# Patient Record
Sex: Male | Born: 1955
Health system: Southern US, Community
[De-identification: ages and names within clinical notes are randomized; demographics above are authoritative.]

## PROBLEM LIST (undated history)

## (undated) DIAGNOSIS — R339 Retention of urine, unspecified: Secondary | ICD-10-CM

## (undated) DIAGNOSIS — N184 Chronic kidney disease, stage 4 (severe): Secondary | ICD-10-CM

## (undated) DIAGNOSIS — M1990 Unspecified osteoarthritis, unspecified site: Secondary | ICD-10-CM

## (undated) DIAGNOSIS — D649 Anemia, unspecified: Secondary | ICD-10-CM

## (undated) DIAGNOSIS — I5030 Unspecified diastolic (congestive) heart failure: Secondary | ICD-10-CM

## (undated) DIAGNOSIS — E119 Type 2 diabetes mellitus without complications: Secondary | ICD-10-CM

## (undated) DIAGNOSIS — N185 Chronic kidney disease, stage 5: Secondary | ICD-10-CM

## (undated) DIAGNOSIS — L97309 Non-pressure chronic ulcer of unspecified ankle with unspecified severity: Secondary | ICD-10-CM

## (undated) DIAGNOSIS — L97509 Non-pressure chronic ulcer of other part of unspecified foot with unspecified severity: Secondary | ICD-10-CM

## (undated) DIAGNOSIS — M109 Gout, unspecified: Secondary | ICD-10-CM

## (undated) DIAGNOSIS — I10 Essential (primary) hypertension: Secondary | ICD-10-CM

## (undated) HISTORY — PX: ANKLE SURGERY: SHX546

## (undated) HISTORY — PX: FOOT SURGERY: SHX648

## (undated) HISTORY — PX: CHOLECYSTECTOMY: SHX55

---

## 2015-05-17 ENCOUNTER — Ambulatory Visit: Payer: Medicare HMO | Admitting: "Endocrinology

## 2016-02-14 ENCOUNTER — Encounter (HOSPITAL_COMMUNITY): Payer: Self-pay

## 2016-02-14 ENCOUNTER — Emergency Department (HOSPITAL_COMMUNITY)
Admission: EM | Admit: 2016-02-14 | Discharge: 2016-02-14 | Disposition: A | Discharge status: Home / Self Care | Payer: Medicare HMO | Attending: Emergency Medicine | Admitting: Emergency Medicine

## 2016-02-14 DIAGNOSIS — L97509 Non-pressure chronic ulcer of other part of unspecified foot with unspecified severity: Secondary | ICD-10-CM

## 2016-02-14 DIAGNOSIS — E08621 Diabetes mellitus due to underlying condition with foot ulcer: Secondary | ICD-10-CM | POA: Insufficient documentation

## 2016-02-14 DIAGNOSIS — Z4800 Encounter for change or removal of nonsurgical wound dressing: Secondary | ICD-10-CM | POA: Diagnosis present

## 2016-02-14 DIAGNOSIS — L02215 Cutaneous abscess of perineum: Secondary | ICD-10-CM | POA: Diagnosis not present

## 2016-02-14 DIAGNOSIS — I10 Essential (primary) hypertension: Secondary | ICD-10-CM | POA: Diagnosis not present

## 2016-02-14 DIAGNOSIS — L97529 Non-pressure chronic ulcer of other part of left foot with unspecified severity: Secondary | ICD-10-CM | POA: Insufficient documentation

## 2016-02-14 HISTORY — DX: Type 2 diabetes mellitus without complications: E11.9

## 2016-02-14 HISTORY — DX: Unspecified osteoarthritis, unspecified site: M19.90

## 2016-02-14 HISTORY — DX: Essential (primary) hypertension: I10

## 2016-02-14 MED ORDER — DOXYCYCLINE HYCLATE 100 MG PO CAPS: 100.0000 mg | ORAL_CAPSULE | Freq: Two times a day (BID) | ORAL | 0 refills | Status: DC

## 2016-02-14 NOTE — Discharge Instructions (Signed)
Please cleanse the wound areas with soap and water daily. Apply dressing daily. Please use doxycycline 2 times daily with food until all taken. You have an appointment with the [facility] wound center at 945 on 03/06/2016. You are also being given information concerning the wound care center in [facility] in the event that you need to be seen sooner.

## 2016-02-14 NOTE — ED Provider Notes (Signed)
[facility] DEPT Provider Note   CSN: [id number] Arrival date & time: 02/14/16  1347  By signing my name below, I, Danny [name], attest that this documentation has been prepared under the direction and in the presence of Lily Kocher, PA-C. Electronically Signed: Rayna [name], ED Scribe. 02/14/16. 3:28 PM.   History   Chief Complaint Chief Complaint  Patient presents with  . Wound Check    HPI HPI Comments: Danny [name] is a 60 y.o. male with a PMHx of DM and morbid obesity who presents to the Emergency Department for a wound check. Pt was last evaluated on 02/03/2016 who he states was supposed to refer him to a new wound care center which he denies has been done as of yet so he came to the ED today for evaluation and a referral. Pt states that he has a wound on his right perineum, right lateral ankle and left plantar foot, which have been evaluated at a wound center as well as by a podiatrist and have been present for the past four months. He is not currently taking abx. Pt has an allergy to rocephin and prednisone stating rocephin causes n/v. He denies other associated symptoms at this time.   The history is provided by the patient. No language interpreter was used.    Past Medical History:  Diagnosis Date  . Arthritis   . Diabetes mellitus without complication ([place])   . Hypertension     There are no active problems to display for this patient.   Past Surgical History:  Procedure Laterality Date  . CHOLECYSTECTOMY       Home Medications    Prior to Admission medications   Not on File    Family History No family history on file.  Social History Social History  Substance Use Topics  . Smoking status: Never Smoker  . Smokeless tobacco: Never Used  . Alcohol use No     Allergies   Review of patient's allergies indicates not on file.   Review of Systems Review of Systems  Constitutional: Negative for chills and fever.  Musculoskeletal: Positive for  myalgias.  Skin: Positive for color change and wound.  Allergic/Immunologic: Positive for immunocompromised state.  All other systems reviewed and are negative.    Physical Exam Updated Vital Signs BP 161/74 (BP Location: Right Arm)   Pulse 81   Temp 98.3 F (36.8 C) (Oral)   Resp 20   Ht 6\' 1"  (1.854 m)   Wt 295 lb (133.8 kg)   SpO2 98%   BMI 38.92 kg/m   Physical Exam  Constitutional: He is oriented to person, place, and time. He appears well-developed.  HENT:  Head: Normocephalic and atraumatic.  Eyes: EOM are normal.  Neck: Normal range of motion.  Cardiovascular: Normal rate, regular rhythm, normal heart sounds and intact distal pulses.  Exam reveals no gallop and no friction rub.   No murmur heard. DP pulse 2+ bilaterally.   Pulmonary/Chest: Effort normal and breath sounds normal. No respiratory distress. He has no wheezes. He has no rales.  Abdominal: Soft.  Genitourinary:  Genitourinary Comments: Chaperone present Ulcer with some drainage to the perineum. No red streaks noted. No palpable abscess at this time. No increased redness of the scrotum. No palpable nodes of the inguinal region.   Musculoskeletal: Normal range of motion.  Neurological: He is alert and oriented to person, place, and time.  Skin: Skin is warm and dry. Capillary refill takes less than 2 seconds.  Right ankle  overlying the lateral malleolus has an ulcer with minimal drainage. Area of irritation just above the ulcer with mild to moderate redness. No red streaks appreciated. No lesions noted between the toes of the right foot. No puncture wounds to the plantar surface of the right foot. Capillary refill less than 2 seconds.  Left plantar surface between the 2nd and 3rd metatarsal heads contains a 3.2 cm ulcer with approximately 1 cm of depth. Capillary refill less than 2 seconds. No red streaks of the LLE.   Psychiatric: He has a normal mood and affect.  Nursing note and vitals reviewed.  ED  Treatments / Results  Labs (all labs ordered are listed, but only abnormal results are displayed) Labs Reviewed - No data to display  EKG  EKG Interpretation None       Radiology No results found.  Procedures Procedures  DIAGNOSTIC STUDIES: Oxygen Saturation is 98% on RA, normal by my interpretation.    COORDINATION OF CARE: 3:26 PM Discussed next steps with pt. Pt verbalized understanding and is agreeable with the plan.    Medications Ordered in ED Medications - No data to display   Initial Impression / Assessment and Plan / ED Course  I have reviewed the triage vital signs and the nursing notes.  Pertinent labs & imaging results that were available during my care of the patient were reviewed by me and considered in my medical decision making (see chart for details).  Clinical Course    **I personally performed the services described in this documentation, which was scribed in my presence. The recorded information has been reviewed and is accurate.*  **I have reviewed nursing notes, vital signs, and all appropriate lab and imaging results for this patient.*  Final Clinical Impressions(s) / ED Diagnoses  Pt placed on antibiotics for the infected wound of the groin. Ulcer of the right ankle and the left foot wrapped with dressing. Pt referred to wound care. Pt to return if any changes or problem.   Final diagnoses:  None    New Prescriptions New Prescriptions   No medications on file     Lily Kocher, PA-C 02/15/16 [place], DO 02/18/16 1459

## 2016-02-14 NOTE — ED Triage Notes (Signed)
Pt here for wound recheck . Pt states he has three wounds that need to be checked. States the one to the groin area has gotten worse.

## 2016-02-25 ENCOUNTER — Encounter (HOSPITAL_BASED_OUTPATIENT_CLINIC_OR_DEPARTMENT_OTHER): Payer: Medicare HMO | Attending: Internal Medicine

## 2016-02-25 DIAGNOSIS — L97311 Non-pressure chronic ulcer of right ankle limited to breakdown of skin: Secondary | ICD-10-CM | POA: Diagnosis not present

## 2016-02-25 DIAGNOSIS — Z87891 Personal history of nicotine dependence: Secondary | ICD-10-CM | POA: Insufficient documentation

## 2016-02-25 DIAGNOSIS — L98491 Non-pressure chronic ulcer of skin of other sites limited to breakdown of skin: Secondary | ICD-10-CM | POA: Insufficient documentation

## 2016-02-25 DIAGNOSIS — E11621 Type 2 diabetes mellitus with foot ulcer: Secondary | ICD-10-CM | POA: Diagnosis present

## 2016-02-25 DIAGNOSIS — E11622 Type 2 diabetes mellitus with other skin ulcer: Secondary | ICD-10-CM | POA: Diagnosis not present

## 2016-02-25 DIAGNOSIS — E1140 Type 2 diabetes mellitus with diabetic neuropathy, unspecified: Secondary | ICD-10-CM | POA: Diagnosis not present

## 2016-02-25 DIAGNOSIS — L97421 Non-pressure chronic ulcer of left heel and midfoot limited to breakdown of skin: Secondary | ICD-10-CM | POA: Insufficient documentation

## 2016-02-25 DIAGNOSIS — I10 Essential (primary) hypertension: Secondary | ICD-10-CM | POA: Insufficient documentation

## 2016-02-25 DIAGNOSIS — Z96659 Presence of unspecified artificial knee joint: Secondary | ICD-10-CM | POA: Diagnosis not present

## 2016-02-25 DIAGNOSIS — Z794 Long term (current) use of insulin: Secondary | ICD-10-CM | POA: Diagnosis not present

## 2016-02-25 LAB — GLUCOSE, CAPILLARY
Glucose-Capillary: 62 mg/dL — ABNORMAL LOW (ref 65–99)
Glucose-Capillary: 87 mg/dL (ref 65–99)

## 2016-03-03 DIAGNOSIS — E11621 Type 2 diabetes mellitus with foot ulcer: Secondary | ICD-10-CM | POA: Diagnosis not present

## 2016-03-06 ENCOUNTER — Encounter (HOSPITAL_BASED_OUTPATIENT_CLINIC_OR_DEPARTMENT_OTHER): Payer: Medicare HMO

## 2016-03-06 DIAGNOSIS — E11621 Type 2 diabetes mellitus with foot ulcer: Secondary | ICD-10-CM | POA: Diagnosis not present

## 2016-03-10 DIAGNOSIS — E11621 Type 2 diabetes mellitus with foot ulcer: Secondary | ICD-10-CM | POA: Diagnosis not present

## 2016-03-17 DIAGNOSIS — E11621 Type 2 diabetes mellitus with foot ulcer: Secondary | ICD-10-CM | POA: Diagnosis not present

## 2016-03-24 DIAGNOSIS — E11621 Type 2 diabetes mellitus with foot ulcer: Secondary | ICD-10-CM | POA: Diagnosis not present

## 2016-03-31 ENCOUNTER — Encounter (HOSPITAL_BASED_OUTPATIENT_CLINIC_OR_DEPARTMENT_OTHER): Payer: Medicare HMO | Attending: Internal Medicine

## 2016-03-31 DIAGNOSIS — I10 Essential (primary) hypertension: Secondary | ICD-10-CM | POA: Insufficient documentation

## 2016-03-31 DIAGNOSIS — L97311 Non-pressure chronic ulcer of right ankle limited to breakdown of skin: Secondary | ICD-10-CM | POA: Insufficient documentation

## 2016-03-31 DIAGNOSIS — E11621 Type 2 diabetes mellitus with foot ulcer: Secondary | ICD-10-CM | POA: Insufficient documentation

## 2016-03-31 DIAGNOSIS — E11622 Type 2 diabetes mellitus with other skin ulcer: Secondary | ICD-10-CM | POA: Insufficient documentation

## 2016-03-31 DIAGNOSIS — L400 Psoriasis vulgaris: Secondary | ICD-10-CM | POA: Diagnosis not present

## 2016-03-31 DIAGNOSIS — L97421 Non-pressure chronic ulcer of left heel and midfoot limited to breakdown of skin: Secondary | ICD-10-CM | POA: Diagnosis not present

## 2016-03-31 DIAGNOSIS — L84 Corns and callosities: Secondary | ICD-10-CM | POA: Insufficient documentation

## 2016-03-31 DIAGNOSIS — L98491 Non-pressure chronic ulcer of skin of other sites limited to breakdown of skin: Secondary | ICD-10-CM | POA: Diagnosis not present

## 2016-04-07 DIAGNOSIS — E11621 Type 2 diabetes mellitus with foot ulcer: Secondary | ICD-10-CM | POA: Diagnosis not present

## 2016-04-14 DIAGNOSIS — E11621 Type 2 diabetes mellitus with foot ulcer: Secondary | ICD-10-CM | POA: Diagnosis not present

## 2016-04-21 DIAGNOSIS — E11621 Type 2 diabetes mellitus with foot ulcer: Secondary | ICD-10-CM | POA: Diagnosis not present

## 2016-04-28 ENCOUNTER — Encounter (HOSPITAL_BASED_OUTPATIENT_CLINIC_OR_DEPARTMENT_OTHER): Payer: Medicare HMO | Attending: Internal Medicine

## 2016-04-28 DIAGNOSIS — I10 Essential (primary) hypertension: Secondary | ICD-10-CM | POA: Insufficient documentation

## 2016-04-28 DIAGNOSIS — E11622 Type 2 diabetes mellitus with other skin ulcer: Secondary | ICD-10-CM | POA: Insufficient documentation

## 2016-04-28 DIAGNOSIS — L97311 Non-pressure chronic ulcer of right ankle limited to breakdown of skin: Secondary | ICD-10-CM | POA: Insufficient documentation

## 2016-04-28 DIAGNOSIS — L97811 Non-pressure chronic ulcer of other part of right lower leg limited to breakdown of skin: Secondary | ICD-10-CM | POA: Insufficient documentation

## 2016-04-28 DIAGNOSIS — L400 Psoriasis vulgaris: Secondary | ICD-10-CM | POA: Diagnosis not present

## 2016-04-28 DIAGNOSIS — L97421 Non-pressure chronic ulcer of left heel and midfoot limited to breakdown of skin: Secondary | ICD-10-CM | POA: Insufficient documentation

## 2016-04-28 DIAGNOSIS — E11621 Type 2 diabetes mellitus with foot ulcer: Secondary | ICD-10-CM | POA: Insufficient documentation

## 2016-05-05 DIAGNOSIS — E11621 Type 2 diabetes mellitus with foot ulcer: Secondary | ICD-10-CM | POA: Diagnosis not present

## 2016-05-12 DIAGNOSIS — E11621 Type 2 diabetes mellitus with foot ulcer: Secondary | ICD-10-CM | POA: Diagnosis not present

## 2016-05-26 ENCOUNTER — Encounter (HOSPITAL_BASED_OUTPATIENT_CLINIC_OR_DEPARTMENT_OTHER): Payer: Medicare HMO | Attending: Internal Medicine

## 2016-05-26 DIAGNOSIS — L97422 Non-pressure chronic ulcer of left heel and midfoot with fat layer exposed: Secondary | ICD-10-CM | POA: Insufficient documentation

## 2016-05-26 DIAGNOSIS — E11621 Type 2 diabetes mellitus with foot ulcer: Secondary | ICD-10-CM | POA: Insufficient documentation

## 2016-05-26 DIAGNOSIS — L97312 Non-pressure chronic ulcer of right ankle with fat layer exposed: Secondary | ICD-10-CM | POA: Insufficient documentation

## 2016-05-26 DIAGNOSIS — I10 Essential (primary) hypertension: Secondary | ICD-10-CM | POA: Diagnosis not present

## 2016-05-26 DIAGNOSIS — L97812 Non-pressure chronic ulcer of other part of right lower leg with fat layer exposed: Secondary | ICD-10-CM | POA: Insufficient documentation

## 2016-06-02 DIAGNOSIS — E11621 Type 2 diabetes mellitus with foot ulcer: Secondary | ICD-10-CM | POA: Diagnosis not present

## 2016-06-09 DIAGNOSIS — E11621 Type 2 diabetes mellitus with foot ulcer: Secondary | ICD-10-CM | POA: Diagnosis not present

## 2016-06-16 DIAGNOSIS — E11621 Type 2 diabetes mellitus with foot ulcer: Secondary | ICD-10-CM | POA: Diagnosis not present

## 2016-06-30 ENCOUNTER — Encounter (HOSPITAL_BASED_OUTPATIENT_CLINIC_OR_DEPARTMENT_OTHER): Payer: Medicare HMO | Attending: Internal Medicine

## 2016-06-30 DIAGNOSIS — I10 Essential (primary) hypertension: Secondary | ICD-10-CM | POA: Insufficient documentation

## 2016-06-30 DIAGNOSIS — L97522 Non-pressure chronic ulcer of other part of left foot with fat layer exposed: Secondary | ICD-10-CM | POA: Diagnosis not present

## 2016-06-30 DIAGNOSIS — E11621 Type 2 diabetes mellitus with foot ulcer: Secondary | ICD-10-CM | POA: Insufficient documentation

## 2016-06-30 DIAGNOSIS — L97312 Non-pressure chronic ulcer of right ankle with fat layer exposed: Secondary | ICD-10-CM | POA: Diagnosis not present

## 2016-07-07 DIAGNOSIS — E11621 Type 2 diabetes mellitus with foot ulcer: Secondary | ICD-10-CM | POA: Diagnosis not present

## 2016-07-21 DIAGNOSIS — E11621 Type 2 diabetes mellitus with foot ulcer: Secondary | ICD-10-CM | POA: Diagnosis not present

## 2016-07-28 ENCOUNTER — Encounter (HOSPITAL_BASED_OUTPATIENT_CLINIC_OR_DEPARTMENT_OTHER): Payer: Medicare HMO | Attending: Internal Medicine

## 2016-07-28 DIAGNOSIS — L97522 Non-pressure chronic ulcer of other part of left foot with fat layer exposed: Secondary | ICD-10-CM | POA: Insufficient documentation

## 2016-07-28 DIAGNOSIS — E11622 Type 2 diabetes mellitus with other skin ulcer: Secondary | ICD-10-CM | POA: Insufficient documentation

## 2016-07-28 DIAGNOSIS — L97312 Non-pressure chronic ulcer of right ankle with fat layer exposed: Secondary | ICD-10-CM | POA: Diagnosis not present

## 2016-07-28 DIAGNOSIS — L98491 Non-pressure chronic ulcer of skin of other sites limited to breakdown of skin: Secondary | ICD-10-CM | POA: Insufficient documentation

## 2016-07-28 DIAGNOSIS — I10 Essential (primary) hypertension: Secondary | ICD-10-CM | POA: Diagnosis not present

## 2016-07-28 DIAGNOSIS — E11621 Type 2 diabetes mellitus with foot ulcer: Secondary | ICD-10-CM | POA: Diagnosis present

## 2016-08-04 DIAGNOSIS — E11621 Type 2 diabetes mellitus with foot ulcer: Secondary | ICD-10-CM | POA: Diagnosis not present

## 2016-08-11 DIAGNOSIS — E11621 Type 2 diabetes mellitus with foot ulcer: Secondary | ICD-10-CM | POA: Diagnosis not present

## 2016-08-18 DIAGNOSIS — E11621 Type 2 diabetes mellitus with foot ulcer: Secondary | ICD-10-CM | POA: Diagnosis not present

## 2016-08-25 ENCOUNTER — Encounter (HOSPITAL_BASED_OUTPATIENT_CLINIC_OR_DEPARTMENT_OTHER): Payer: Medicare HMO | Attending: Internal Medicine

## 2016-08-25 DIAGNOSIS — E11621 Type 2 diabetes mellitus with foot ulcer: Secondary | ICD-10-CM | POA: Insufficient documentation

## 2016-08-25 DIAGNOSIS — I10 Essential (primary) hypertension: Secondary | ICD-10-CM | POA: Diagnosis not present

## 2016-08-25 DIAGNOSIS — L97412 Non-pressure chronic ulcer of right heel and midfoot with fat layer exposed: Secondary | ICD-10-CM | POA: Diagnosis not present

## 2016-08-25 DIAGNOSIS — E11622 Type 2 diabetes mellitus with other skin ulcer: Secondary | ICD-10-CM | POA: Diagnosis not present

## 2016-08-25 DIAGNOSIS — L97312 Non-pressure chronic ulcer of right ankle with fat layer exposed: Secondary | ICD-10-CM | POA: Diagnosis not present

## 2016-09-01 DIAGNOSIS — E11621 Type 2 diabetes mellitus with foot ulcer: Secondary | ICD-10-CM | POA: Diagnosis not present

## 2016-09-08 DIAGNOSIS — E11621 Type 2 diabetes mellitus with foot ulcer: Secondary | ICD-10-CM | POA: Diagnosis not present

## 2016-09-15 DIAGNOSIS — E11621 Type 2 diabetes mellitus with foot ulcer: Secondary | ICD-10-CM | POA: Diagnosis not present

## 2016-09-22 ENCOUNTER — Ambulatory Visit (HOSPITAL_COMMUNITY)
Admission: RE | Admit: 2016-09-22 | Discharge: 2016-09-22 | Disposition: A | Discharge status: Home / Self Care | Payer: Medicare HMO | Source: Ambulatory Visit | Attending: Nurse Practitioner | Admitting: Nurse Practitioner

## 2016-09-22 ENCOUNTER — Other Ambulatory Visit: Payer: Self-pay | Admitting: Nurse Practitioner

## 2016-09-22 DIAGNOSIS — L97512 Non-pressure chronic ulcer of other part of right foot with fat layer exposed: Secondary | ICD-10-CM | POA: Insufficient documentation

## 2016-09-22 DIAGNOSIS — L97819 Non-pressure chronic ulcer of other part of right lower leg with unspecified severity: Secondary | ICD-10-CM | POA: Insufficient documentation

## 2016-09-22 DIAGNOSIS — M19072 Primary osteoarthritis, left ankle and foot: Secondary | ICD-10-CM | POA: Diagnosis not present

## 2016-09-22 DIAGNOSIS — L97522 Non-pressure chronic ulcer of other part of left foot with fat layer exposed: Secondary | ICD-10-CM | POA: Insufficient documentation

## 2016-09-22 DIAGNOSIS — E13621 Other specified diabetes mellitus with foot ulcer: Secondary | ICD-10-CM

## 2016-09-22 DIAGNOSIS — M7989 Other specified soft tissue disorders: Secondary | ICD-10-CM | POA: Diagnosis not present

## 2016-09-22 DIAGNOSIS — M19071 Primary osteoarthritis, right ankle and foot: Secondary | ICD-10-CM | POA: Diagnosis not present

## 2016-09-22 DIAGNOSIS — L97529 Non-pressure chronic ulcer of other part of left foot with unspecified severity: Secondary | ICD-10-CM

## 2016-09-22 DIAGNOSIS — M7732 Calcaneal spur, left foot: Secondary | ICD-10-CM | POA: Insufficient documentation

## 2016-09-22 DIAGNOSIS — E11621 Type 2 diabetes mellitus with foot ulcer: Secondary | ICD-10-CM | POA: Diagnosis not present

## 2016-09-22 DIAGNOSIS — L97519 Non-pressure chronic ulcer of other part of right foot with unspecified severity: Secondary | ICD-10-CM

## 2016-09-29 ENCOUNTER — Encounter (HOSPITAL_BASED_OUTPATIENT_CLINIC_OR_DEPARTMENT_OTHER): Payer: Medicare HMO | Attending: Internal Medicine

## 2016-09-29 DIAGNOSIS — I10 Essential (primary) hypertension: Secondary | ICD-10-CM | POA: Diagnosis not present

## 2016-09-29 DIAGNOSIS — L97312 Non-pressure chronic ulcer of right ankle with fat layer exposed: Secondary | ICD-10-CM | POA: Insufficient documentation

## 2016-09-29 DIAGNOSIS — L97522 Non-pressure chronic ulcer of other part of left foot with fat layer exposed: Secondary | ICD-10-CM | POA: Diagnosis not present

## 2016-09-29 DIAGNOSIS — E11621 Type 2 diabetes mellitus with foot ulcer: Secondary | ICD-10-CM | POA: Diagnosis present

## 2016-10-06 ENCOUNTER — Other Ambulatory Visit (HOSPITAL_COMMUNITY)
Admission: RE | Admit: 2016-10-06 | Discharge: 2016-10-06 | Disposition: A | Discharge status: Home / Self Care | Payer: Medicare HMO | Source: Other Acute Inpatient Hospital | Attending: Internal Medicine | Admitting: Internal Medicine

## 2016-10-06 DIAGNOSIS — E11621 Type 2 diabetes mellitus with foot ulcer: Secondary | ICD-10-CM | POA: Diagnosis present

## 2016-10-09 LAB — AEROBIC CULTURE W GRAM STAIN (SUPERFICIAL SPECIMEN)

## 2016-10-09 LAB — AEROBIC CULTURE  (SUPERFICIAL SPECIMEN)

## 2016-10-13 DIAGNOSIS — E11621 Type 2 diabetes mellitus with foot ulcer: Secondary | ICD-10-CM | POA: Diagnosis not present

## 2016-10-20 DIAGNOSIS — E11621 Type 2 diabetes mellitus with foot ulcer: Secondary | ICD-10-CM | POA: Diagnosis not present

## 2016-10-27 ENCOUNTER — Encounter (HOSPITAL_BASED_OUTPATIENT_CLINIC_OR_DEPARTMENT_OTHER): Payer: Medicare HMO | Attending: Internal Medicine

## 2016-10-27 DIAGNOSIS — L97312 Non-pressure chronic ulcer of right ankle with fat layer exposed: Secondary | ICD-10-CM | POA: Insufficient documentation

## 2016-10-27 DIAGNOSIS — L97522 Non-pressure chronic ulcer of other part of left foot with fat layer exposed: Secondary | ICD-10-CM | POA: Insufficient documentation

## 2016-10-27 DIAGNOSIS — E11621 Type 2 diabetes mellitus with foot ulcer: Secondary | ICD-10-CM | POA: Insufficient documentation

## 2016-10-27 DIAGNOSIS — I10 Essential (primary) hypertension: Secondary | ICD-10-CM | POA: Diagnosis not present

## 2016-11-03 DIAGNOSIS — E11621 Type 2 diabetes mellitus with foot ulcer: Secondary | ICD-10-CM | POA: Diagnosis not present

## 2016-11-10 DIAGNOSIS — E11621 Type 2 diabetes mellitus with foot ulcer: Secondary | ICD-10-CM | POA: Diagnosis not present

## 2016-11-17 DIAGNOSIS — E11621 Type 2 diabetes mellitus with foot ulcer: Secondary | ICD-10-CM | POA: Diagnosis not present

## 2016-11-24 ENCOUNTER — Encounter (HOSPITAL_BASED_OUTPATIENT_CLINIC_OR_DEPARTMENT_OTHER): Payer: Medicare HMO | Attending: Internal Medicine

## 2016-11-24 DIAGNOSIS — L97522 Non-pressure chronic ulcer of other part of left foot with fat layer exposed: Secondary | ICD-10-CM | POA: Diagnosis not present

## 2016-11-24 DIAGNOSIS — E11622 Type 2 diabetes mellitus with other skin ulcer: Secondary | ICD-10-CM | POA: Diagnosis present

## 2016-11-24 DIAGNOSIS — I87333 Chronic venous hypertension (idiopathic) with ulcer and inflammation of bilateral lower extremity: Secondary | ICD-10-CM | POA: Insufficient documentation

## 2016-11-24 DIAGNOSIS — E11621 Type 2 diabetes mellitus with foot ulcer: Secondary | ICD-10-CM | POA: Diagnosis not present

## 2016-11-24 DIAGNOSIS — L97312 Non-pressure chronic ulcer of right ankle with fat layer exposed: Secondary | ICD-10-CM | POA: Insufficient documentation

## 2016-11-24 DIAGNOSIS — I10 Essential (primary) hypertension: Secondary | ICD-10-CM | POA: Diagnosis not present

## 2016-12-01 DIAGNOSIS — E11622 Type 2 diabetes mellitus with other skin ulcer: Secondary | ICD-10-CM | POA: Diagnosis not present

## 2016-12-15 DIAGNOSIS — E11622 Type 2 diabetes mellitus with other skin ulcer: Secondary | ICD-10-CM | POA: Diagnosis not present

## 2016-12-29 ENCOUNTER — Encounter (HOSPITAL_BASED_OUTPATIENT_CLINIC_OR_DEPARTMENT_OTHER): Payer: Medicare HMO

## 2017-07-30 DIAGNOSIS — I872 Venous insufficiency (chronic) (peripheral): Secondary | ICD-10-CM | POA: Diagnosis not present

## 2017-07-30 DIAGNOSIS — Z6837 Body mass index (BMI) 37.0-37.9, adult: Secondary | ICD-10-CM | POA: Diagnosis not present

## 2017-07-30 DIAGNOSIS — L409 Psoriasis, unspecified: Secondary | ICD-10-CM | POA: Diagnosis not present

## 2017-07-30 DIAGNOSIS — E785 Hyperlipidemia, unspecified: Secondary | ICD-10-CM | POA: Diagnosis not present

## 2017-07-30 DIAGNOSIS — L89893 Pressure ulcer of other site, stage 3: Secondary | ICD-10-CM | POA: Diagnosis not present

## 2017-07-30 DIAGNOSIS — E1165 Type 2 diabetes mellitus with hyperglycemia: Secondary | ICD-10-CM | POA: Diagnosis not present

## 2017-07-30 DIAGNOSIS — I10 Essential (primary) hypertension: Secondary | ICD-10-CM | POA: Diagnosis not present

## 2017-08-06 DIAGNOSIS — E785 Hyperlipidemia, unspecified: Secondary | ICD-10-CM | POA: Diagnosis not present

## 2017-08-06 DIAGNOSIS — I872 Venous insufficiency (chronic) (peripheral): Secondary | ICD-10-CM | POA: Diagnosis not present

## 2017-08-06 DIAGNOSIS — L89893 Pressure ulcer of other site, stage 3: Secondary | ICD-10-CM | POA: Diagnosis not present

## 2017-08-06 DIAGNOSIS — L409 Psoriasis, unspecified: Secondary | ICD-10-CM | POA: Diagnosis not present

## 2017-08-06 DIAGNOSIS — Z6837 Body mass index (BMI) 37.0-37.9, adult: Secondary | ICD-10-CM | POA: Diagnosis not present

## 2017-08-06 DIAGNOSIS — E1165 Type 2 diabetes mellitus with hyperglycemia: Secondary | ICD-10-CM | POA: Diagnosis not present

## 2017-08-06 DIAGNOSIS — I10 Essential (primary) hypertension: Secondary | ICD-10-CM | POA: Diagnosis not present

## 2017-08-11 DIAGNOSIS — H9203 Otalgia, bilateral: Secondary | ICD-10-CM | POA: Diagnosis not present

## 2017-08-11 DIAGNOSIS — R070 Pain in throat: Secondary | ICD-10-CM | POA: Diagnosis not present

## 2017-08-11 DIAGNOSIS — Z6837 Body mass index (BMI) 37.0-37.9, adult: Secondary | ICD-10-CM | POA: Diagnosis not present

## 2017-08-24 DIAGNOSIS — Z6837 Body mass index (BMI) 37.0-37.9, adult: Secondary | ICD-10-CM | POA: Diagnosis not present

## 2017-08-24 DIAGNOSIS — I872 Venous insufficiency (chronic) (peripheral): Secondary | ICD-10-CM | POA: Diagnosis not present

## 2017-08-24 DIAGNOSIS — L89893 Pressure ulcer of other site, stage 3: Secondary | ICD-10-CM | POA: Diagnosis not present

## 2017-08-24 DIAGNOSIS — E785 Hyperlipidemia, unspecified: Secondary | ICD-10-CM | POA: Diagnosis not present

## 2017-08-24 DIAGNOSIS — I10 Essential (primary) hypertension: Secondary | ICD-10-CM | POA: Diagnosis not present

## 2017-08-24 DIAGNOSIS — E1165 Type 2 diabetes mellitus with hyperglycemia: Secondary | ICD-10-CM | POA: Diagnosis not present

## 2017-08-24 DIAGNOSIS — L409 Psoriasis, unspecified: Secondary | ICD-10-CM | POA: Diagnosis not present

## 2017-09-25 DIAGNOSIS — I872 Venous insufficiency (chronic) (peripheral): Secondary | ICD-10-CM | POA: Diagnosis not present

## 2017-09-25 DIAGNOSIS — R05 Cough: Secondary | ICD-10-CM | POA: Diagnosis not present

## 2017-09-25 DIAGNOSIS — J060 Acute laryngopharyngitis: Secondary | ICD-10-CM | POA: Diagnosis not present

## 2017-09-25 DIAGNOSIS — L89623 Pressure ulcer of left heel, stage 3: Secondary | ICD-10-CM | POA: Diagnosis not present

## 2017-09-25 DIAGNOSIS — Z6841 Body Mass Index (BMI) 40.0 and over, adult: Secondary | ICD-10-CM | POA: Diagnosis not present

## 2017-09-25 DIAGNOSIS — K219 Gastro-esophageal reflux disease without esophagitis: Secondary | ICD-10-CM | POA: Diagnosis not present

## 2017-10-19 DIAGNOSIS — Z6841 Body Mass Index (BMI) 40.0 and over, adult: Secondary | ICD-10-CM | POA: Diagnosis not present

## 2017-10-19 DIAGNOSIS — L89893 Pressure ulcer of other site, stage 3: Secondary | ICD-10-CM | POA: Diagnosis not present

## 2017-10-19 DIAGNOSIS — Z6837 Body mass index (BMI) 37.0-37.9, adult: Secondary | ICD-10-CM | POA: Diagnosis not present

## 2017-10-19 DIAGNOSIS — E1165 Type 2 diabetes mellitus with hyperglycemia: Secondary | ICD-10-CM | POA: Diagnosis not present

## 2017-10-19 DIAGNOSIS — H9203 Otalgia, bilateral: Secondary | ICD-10-CM | POA: Diagnosis not present

## 2017-10-19 DIAGNOSIS — J060 Acute laryngopharyngitis: Secondary | ICD-10-CM | POA: Diagnosis not present

## 2017-10-19 DIAGNOSIS — R05 Cough: Secondary | ICD-10-CM | POA: Diagnosis not present

## 2017-10-19 DIAGNOSIS — R070 Pain in throat: Secondary | ICD-10-CM | POA: Diagnosis not present

## 2017-10-19 DIAGNOSIS — I872 Venous insufficiency (chronic) (peripheral): Secondary | ICD-10-CM | POA: Diagnosis not present

## 2017-11-12 DIAGNOSIS — I83009 Varicose veins of unspecified lower extremity with ulcer of unspecified site: Secondary | ICD-10-CM | POA: Diagnosis not present

## 2017-11-12 DIAGNOSIS — I872 Venous insufficiency (chronic) (peripheral): Secondary | ICD-10-CM | POA: Diagnosis not present

## 2017-12-03 DIAGNOSIS — I872 Venous insufficiency (chronic) (peripheral): Secondary | ICD-10-CM | POA: Diagnosis not present

## 2017-12-03 DIAGNOSIS — I890 Lymphedema, not elsewhere classified: Secondary | ICD-10-CM | POA: Diagnosis not present

## 2017-12-03 DIAGNOSIS — L304 Erythema intertrigo: Secondary | ICD-10-CM | POA: Diagnosis not present

## 2017-12-03 DIAGNOSIS — Z79899 Other long term (current) drug therapy: Secondary | ICD-10-CM | POA: Diagnosis not present

## 2017-12-03 DIAGNOSIS — Z5181 Encounter for therapeutic drug level monitoring: Secondary | ICD-10-CM | POA: Diagnosis not present

## 2017-12-03 DIAGNOSIS — L409 Psoriasis, unspecified: Secondary | ICD-10-CM | POA: Diagnosis not present

## 2017-12-26 ENCOUNTER — Other Ambulatory Visit (HOSPITAL_COMMUNITY)
Admission: RE | Admit: 2017-12-26 | Discharge: 2017-12-26 | Disposition: A | Discharge status: Home / Self Care | Payer: Medicare HMO | Source: Ambulatory Visit | Attending: Internal Medicine | Admitting: Internal Medicine

## 2017-12-26 DIAGNOSIS — R070 Pain in throat: Secondary | ICD-10-CM | POA: Insufficient documentation

## 2017-12-26 DIAGNOSIS — K219 Gastro-esophageal reflux disease without esophagitis: Secondary | ICD-10-CM | POA: Diagnosis not present

## 2017-12-26 DIAGNOSIS — H9203 Otalgia, bilateral: Secondary | ICD-10-CM | POA: Insufficient documentation

## 2017-12-26 DIAGNOSIS — E785 Hyperlipidemia, unspecified: Secondary | ICD-10-CM | POA: Diagnosis present

## 2017-12-26 DIAGNOSIS — I10 Essential (primary) hypertension: Secondary | ICD-10-CM | POA: Insufficient documentation

## 2017-12-26 DIAGNOSIS — Z6837 Body mass index (BMI) 37.0-37.9, adult: Secondary | ICD-10-CM | POA: Insufficient documentation

## 2017-12-26 DIAGNOSIS — R05 Cough: Secondary | ICD-10-CM | POA: Insufficient documentation

## 2017-12-26 DIAGNOSIS — L89893 Pressure ulcer of other site, stage 3: Secondary | ICD-10-CM | POA: Diagnosis not present

## 2017-12-26 DIAGNOSIS — Z6841 Body Mass Index (BMI) 40.0 and over, adult: Secondary | ICD-10-CM | POA: Diagnosis present

## 2017-12-26 DIAGNOSIS — J060 Acute laryngopharyngitis: Secondary | ICD-10-CM | POA: Insufficient documentation

## 2017-12-26 DIAGNOSIS — L89623 Pressure ulcer of left heel, stage 3: Secondary | ICD-10-CM | POA: Insufficient documentation

## 2017-12-26 DIAGNOSIS — L409 Psoriasis, unspecified: Secondary | ICD-10-CM | POA: Diagnosis present

## 2017-12-26 DIAGNOSIS — I872 Venous insufficiency (chronic) (peripheral): Secondary | ICD-10-CM | POA: Insufficient documentation

## 2017-12-26 DIAGNOSIS — E1165 Type 2 diabetes mellitus with hyperglycemia: Secondary | ICD-10-CM | POA: Insufficient documentation

## 2017-12-26 LAB — CBC WITH DIFFERENTIAL/PLATELET
Basophils Absolute: 0 10*3/uL (ref 0.0–0.1)
Basophils Relative: 0 %
Eosinophils Absolute: 0.3 10*3/uL (ref 0.0–0.7)
Eosinophils Relative: 4 %
HCT: 35 % — ABNORMAL LOW (ref 39.0–52.0)
HEMOGLOBIN: 11.4 g/dL — AB (ref 13.0–17.0)
LYMPHS ABS: 1.8 10*3/uL (ref 0.7–4.0)
Lymphocytes Relative: 20 %
MCH: 26.3 pg (ref 26.0–34.0)
MCHC: 32.6 g/dL (ref 30.0–36.0)
MCV: 80.8 fL (ref 78.0–100.0)
MONOS PCT: 8 %
Monocytes Absolute: 0.7 10*3/uL (ref 0.1–1.0)
NEUTROS PCT: 68 %
Neutro Abs: 6 10*3/uL (ref 1.7–7.7)
Platelets: 217 10*3/uL (ref 150–400)
RBC: 4.33 MIL/uL (ref 4.22–5.81)
RDW: 15.9 % — ABNORMAL HIGH (ref 11.5–15.5)
WBC: 8.8 10*3/uL (ref 4.0–10.5)

## 2017-12-26 LAB — COMPREHENSIVE METABOLIC PANEL
ALK PHOS: 93 U/L (ref 38–126)
ALT: 31 U/L (ref 0–44)
ANION GAP: 9 (ref 5–15)
AST: 28 U/L (ref 15–41)
Albumin: 3 g/dL — ABNORMAL LOW (ref 3.5–5.0)
BILIRUBIN TOTAL: 0.8 mg/dL (ref 0.3–1.2)
BUN: 20 mg/dL (ref 8–23)
CALCIUM: 8.6 mg/dL — AB (ref 8.9–10.3)
CO2: 25 mmol/L (ref 22–32)
Chloride: 105 mmol/L (ref 98–111)
Creatinine, Ser: 1.92 mg/dL — ABNORMAL HIGH (ref 0.61–1.24)
GFR calc Af Amer: 41 mL/min — ABNORMAL LOW (ref >60)
GFR, EST NON AFRICAN AMERICAN: 35 mL/min — AB (ref >60)
GLUCOSE: 100 mg/dL — AB (ref 70–99)
POTASSIUM: 3.9 mmol/L (ref 3.5–5.1)
Sodium: 139 mmol/L (ref 135–145)
TOTAL PROTEIN: 7.3 g/dL (ref 6.5–8.1)

## 2017-12-26 LAB — LIPID PANEL
Cholesterol: 151 mg/dL (ref 0–200)
HDL: 32 mg/dL — ABNORMAL LOW (ref >40)
LDL CALC: 101 mg/dL — AB (ref 0–99)
Total CHOL/HDL Ratio: 4.7 RATIO
Triglycerides: 91 mg/dL (ref <150)
VLDL: 18 mg/dL (ref 0–40)

## 2017-12-26 LAB — HEMOGLOBIN A1C
HEMOGLOBIN A1C: 8.7 % — AB (ref 4.8–5.6)
Mean Plasma Glucose: 202.99 mg/dL

## 2017-12-27 LAB — MICROALBUMIN / CREATININE URINE RATIO
CREATININE, UR: 67.3 mg/dL
MICROALB UR: 1037.4 ug/mL — AB
MICROALB/CREAT RATIO: 1541.5 mg/g{creat} — AB (ref 0.0–30.0)

## 2017-12-28 DIAGNOSIS — N189 Chronic kidney disease, unspecified: Secondary | ICD-10-CM | POA: Diagnosis not present

## 2017-12-28 DIAGNOSIS — L409 Psoriasis, unspecified: Secondary | ICD-10-CM | POA: Diagnosis not present

## 2017-12-28 DIAGNOSIS — E1165 Type 2 diabetes mellitus with hyperglycemia: Secondary | ICD-10-CM | POA: Diagnosis not present

## 2017-12-28 DIAGNOSIS — Z6841 Body Mass Index (BMI) 40.0 and over, adult: Secondary | ICD-10-CM | POA: Diagnosis not present

## 2017-12-28 DIAGNOSIS — E782 Mixed hyperlipidemia: Secondary | ICD-10-CM | POA: Diagnosis not present

## 2017-12-28 DIAGNOSIS — I129 Hypertensive chronic kidney disease with stage 1 through stage 4 chronic kidney disease, or unspecified chronic kidney disease: Secondary | ICD-10-CM | POA: Diagnosis not present

## 2017-12-28 DIAGNOSIS — L89893 Pressure ulcer of other site, stage 3: Secondary | ICD-10-CM | POA: Diagnosis not present

## 2018-01-20 ENCOUNTER — Emergency Department (HOSPITAL_COMMUNITY): Payer: Medicare HMO

## 2018-01-20 ENCOUNTER — Encounter (HOSPITAL_COMMUNITY): Payer: Self-pay | Admitting: *Deleted

## 2018-01-20 ENCOUNTER — Emergency Department (HOSPITAL_COMMUNITY)
Admission: EM | Admit: 2018-01-20 | Discharge: 2018-01-20 | Disposition: A | Discharge status: Home / Self Care | Payer: Medicare HMO | Attending: Emergency Medicine | Admitting: Emergency Medicine

## 2018-01-20 DIAGNOSIS — R531 Weakness: Secondary | ICD-10-CM | POA: Diagnosis not present

## 2018-01-20 DIAGNOSIS — S79911A Unspecified injury of right hip, initial encounter: Secondary | ICD-10-CM | POA: Diagnosis not present

## 2018-01-20 DIAGNOSIS — M25551 Pain in right hip: Secondary | ICD-10-CM | POA: Diagnosis not present

## 2018-01-20 DIAGNOSIS — R55 Syncope and collapse: Secondary | ICD-10-CM | POA: Insufficient documentation

## 2018-01-20 DIAGNOSIS — S0990XA Unspecified injury of head, initial encounter: Secondary | ICD-10-CM | POA: Diagnosis not present

## 2018-01-20 DIAGNOSIS — E119 Type 2 diabetes mellitus without complications: Secondary | ICD-10-CM | POA: Insufficient documentation

## 2018-01-20 DIAGNOSIS — I10 Essential (primary) hypertension: Secondary | ICD-10-CM | POA: Diagnosis not present

## 2018-01-20 DIAGNOSIS — Z7902 Long term (current) use of antithrombotics/antiplatelets: Secondary | ICD-10-CM | POA: Diagnosis not present

## 2018-01-20 DIAGNOSIS — Z794 Long term (current) use of insulin: Secondary | ICD-10-CM | POA: Diagnosis not present

## 2018-01-20 DIAGNOSIS — Z79899 Other long term (current) drug therapy: Secondary | ICD-10-CM | POA: Diagnosis not present

## 2018-01-20 HISTORY — DX: Non-pressure chronic ulcer of other part of unspecified foot with unspecified severity: L97.509

## 2018-01-20 LAB — CBC WITH DIFFERENTIAL/PLATELET
BASOS ABS: 0.1 10*3/uL (ref 0.0–0.1)
Basophils Relative: 1 %
EOS PCT: 3 %
Eosinophils Absolute: 0.3 10*3/uL (ref 0.0–0.7)
HEMATOCRIT: 34.9 % — AB (ref 39.0–52.0)
HEMOGLOBIN: 11.5 g/dL — AB (ref 13.0–17.0)
LYMPHS ABS: 2 10*3/uL (ref 0.7–4.0)
LYMPHS PCT: 19 %
MCH: 26.4 pg (ref 26.0–34.0)
MCHC: 33 g/dL (ref 30.0–36.0)
MCV: 80 fL (ref 78.0–100.0)
MONO ABS: 0.8 10*3/uL (ref 0.1–1.0)
MONOS PCT: 7 %
Neutro Abs: 7.4 10*3/uL (ref 1.7–7.7)
Neutrophils Relative %: 70 %
Platelets: 212 10*3/uL (ref 150–400)
RBC: 4.36 MIL/uL (ref 4.22–5.81)
RDW: 16 % — ABNORMAL HIGH (ref 11.5–15.5)
WBC: 10.5 10*3/uL (ref 4.0–10.5)

## 2018-01-20 LAB — URINALYSIS, ROUTINE W REFLEX MICROSCOPIC
BACTERIA UA: NONE SEEN
BILIRUBIN URINE: NEGATIVE
Glucose, UA: 50 mg/dL — AB
KETONES UR: NEGATIVE mg/dL
LEUKOCYTES UA: NEGATIVE
NITRITE: NEGATIVE
PH: 7 (ref 5.0–8.0)
Protein, ur: 100 mg/dL — AB
SPECIFIC GRAVITY, URINE: 1.009 (ref 1.005–1.030)

## 2018-01-20 LAB — COMPREHENSIVE METABOLIC PANEL
ALK PHOS: 105 U/L (ref 38–126)
ALT: 29 U/L (ref 0–44)
ANION GAP: 7 (ref 5–15)
AST: 34 U/L (ref 15–41)
Albumin: 3 g/dL — ABNORMAL LOW (ref 3.5–5.0)
BILIRUBIN TOTAL: 0.8 mg/dL (ref 0.3–1.2)
BUN: 23 mg/dL (ref 8–23)
CALCIUM: 9.1 mg/dL (ref 8.9–10.3)
CO2: 28 mmol/L (ref 22–32)
CREATININE: 1.95 mg/dL — AB (ref 0.61–1.24)
Chloride: 105 mmol/L (ref 98–111)
GFR, EST AFRICAN AMERICAN: 40 mL/min — AB (ref >60)
GFR, EST NON AFRICAN AMERICAN: 35 mL/min — AB (ref >60)
Glucose, Bld: 79 mg/dL (ref 70–99)
Potassium: 3.4 mmol/L — ABNORMAL LOW (ref 3.5–5.1)
Sodium: 140 mmol/L (ref 135–145)
Total Protein: 7.2 g/dL (ref 6.5–8.1)

## 2018-01-20 LAB — CBG MONITORING, ED: Glucose-Capillary: 81 mg/dL (ref 70–99)

## 2018-01-20 LAB — TROPONIN I

## 2018-01-20 LAB — ETHANOL: Alcohol, Ethyl (B): <10 mg/dL (ref <10)

## 2018-01-20 NOTE — Discharge Instructions (Signed)
Your testing today shows that you have had some high blood pressure but no other abnormal findings on your blood work to suggest a cause of this event that happened to you today.  The CT scan of your brain did not show any signs of stroke, the EKG did not show any signs of heart attack.  I have offered you observation in the hospital but you have chosen to go home and follow-up with Dr. Nevada Crane however if you choose to return at any point we can continue the evaluation in the hospital.  If you should develop severe or worsening symptoms you should return immediately including chest pain, difficulty breathing, passing out, fevers, weakness of one side or the other, difficulty speaking, changes in vision or balance.

## 2018-01-20 NOTE — ED Notes (Signed)
Pt to ct 

## 2018-01-20 NOTE — ED Notes (Signed)
Family at bedside. 

## 2018-01-20 NOTE — ED Triage Notes (Signed)
Pt states that his right hip gave out and pt fell, pt not able to put full weight on right hip.  Pt denies hitting head, states landed on the grass. Pt c/o fatigue.

## 2018-01-20 NOTE — ED Provider Notes (Signed)
[facility] EMERGENCY DEPARTMENT Provider Note   CSN: [id number] Arrival date & time: 01/20/18  1259     History   Chief Complaint Chief Complaint  Patient presents with  . Hip Pain    HPI Danny [name] is a 62 y.o. male.  HPI  The pt is a 62 y/o male - hx of Htn and DM, states that he had taken his morning diabetes medications including insulin, he then went to church, he had breakfast including scrambled eggs, toast, coffee as per his normal, when he was leaving church to put his walker in the back of his car he developed an acute weakness of his right leg, both of his arms that he collapsed to the ground.  He was unable to get up off the ground for some time, it is not clear exactly how long it lasted but at this time he states he is totally back to normal with no weakness at all.  He does complain of bilateral leg swelling which is chronic and erythema of his legs which is been present for over a year, he is supposed to wear compression stockings but does not.  He denies fevers chills nausea vomiting or diarrhea.  He has no chest pain shortness of breath or palpitations.  He has no changes in his vision, headache and denies any numbness or weakness at this time.  During this episode he does report that he had no changes in vision, no difficulty speaking, he felt like both of his arms were not working correctly.  On a normal day he is able to get up off the ground without any difficulty.  He only uses a walker because of chronic right ankle pain after fracturing this ankle or foot bone in his 50s.  Past Medical History:  Diagnosis Date  . Arthritis   . Diabetes mellitus without complication ([place])   . Foot ulcer ([place])   . Hypertension     There are no active problems to display for this patient.   Past Surgical History:  Procedure Laterality Date  . ANKLE SURGERY    . CHOLECYSTECTOMY    . FOOT SURGERY          Home Medications    Prior to Admission medications     Medication Sig Start Date End Date Taking? Authorizing Provider  allopurinol (ZYLOPRIM) 300 MG tablet Take 1 tablet by mouth daily. 01/10/18   [provider]  augmented betamethasone dipropionate (DIPROLENE-AF) 0.05 % cream APPLY CREAM TOPICALLY TO AFFECTED AREA (LEGS) UP TO TWICE DAILY AS NEEDED (NOT TO FACE GROIN OR UNDERARMS) 11/28/17   [provider]  Brodalumab 210 MG/1.5ML SOSY Inject 210 mg into the skin every 14 (fourteen) days. 12/25/17   [provider]  glycopyrrolate (ROBINUL) 2 MG tablet Take 2 mg by mouth 2 (two) times daily. 12/30/17   [provider]  hydrochlorothiazide (HYDRODIURIL) 25 MG tablet Take 1 tablet by mouth 2 (two) times daily. 01/10/18   [provider]  lisinopril (PRINIVIL,ZESTRIL) 20 MG tablet Take 1 tablet by mouth 2 (two) times daily. 11/05/17   [provider]  metoprolol succinate (TOPROL-XL) 100 MG 24 hr tablet Take 1 tablet by mouth daily. 12/17/17   [provider]  NOVOLIN 70/30 RELION (70-30) 100 UNIT/ML injection Inject 20-100 Units into the skin See admin instructions. 100 units at breakfast, 20 units at lunch, and 100 unit at dinner. 12/20/17   [provider]  pravastatin (PRAVACHOL) 40 MG tablet Take 1  tablet by mouth daily. 01/10/18   [provider]  verapamil (CALAN) 120 MG tablet Take 1 tablet by mouth daily. 11/05/17   [provider]    Family History History reviewed. No pertinent family history.  Social History Social History   Tobacco Use  . Smoking status: Never Smoker  . Smokeless tobacco: Never Used  Substance Use Topics  . Alcohol use: No  . Drug use: No     Allergies   Dust mite extract; Prednisone; and Rocephin [ceftriaxone]   Review of Systems Review of Systems  All other systems reviewed and are negative.    Physical Exam Updated Vital Signs BP (!) 182/78   Pulse 98   Temp 98.6 F (37 C) (Oral)   Resp (!) 22   Ht 6\' 1"  (1.854 m)    Wt (!) 142.9 kg (315 lb)   SpO2 100%   BMI 41.56 kg/m   Physical Exam  Constitutional: He appears well-developed and well-nourished. No distress.  HENT:  Head: Normocephalic and atraumatic.  Mouth/Throat: Oropharynx is clear and moist. No oropharyngeal exudate.  Eyes: Pupils are equal, round, and reactive to light. Conjunctivae and EOM are normal. Right eye exhibits no discharge. Left eye exhibits no discharge. No scleral icterus.  Neck: Normal range of motion. Neck supple. No JVD present. No thyromegaly present.  Cardiovascular: Normal rate, regular rhythm, normal heart sounds and intact distal pulses. Exam reveals no gallop and no friction rub.  No murmur heard. Pulmonary/Chest: Effort normal and breath sounds normal. No respiratory distress. He has no wheezes. He has no rales.  Abdominal: Soft. Bowel sounds are normal. He exhibits no distension and no mass. There is no tenderness.  Musculoskeletal: Normal range of motion. He exhibits edema. He exhibits no tenderness.  Bilateral lower extremity hard pitting edema of the bilateral lower extremities below the knees.  Lymphadenopathy:    He has no cervical adenopathy.  Neurological: He is alert. Coordination normal.  Speech is clear, cranial nerves III through XII are intact, memory is intact, strength is normal in all 4 extremities including grips, sensation is intact to light touch and pinprick in all 4 extremities. Coordination as tested by finger-nose-finger is normal, no limb ataxia. Normal gait, normal reflexes at the patellar tendons bilaterally.  The patient was able to ambulate back and forth to the bathroom with his walker without any difficulty, this was brisk  Skin: Skin is warm and dry. No rash noted. There is erythema.  Erythema of the edema two thirds of the way up the leg through the foot, symmetrical  Psychiatric: He has a normal mood and affect. His behavior is normal.  Nursing note and vitals reviewed.    ED  Treatments / Results  Labs (all labs ordered are listed, but only abnormal results are displayed) Labs Reviewed  URINALYSIS, ROUTINE W REFLEX MICROSCOPIC - Abnormal; Notable for the following components:      Result Value   Glucose, UA 50 (*)    Hgb urine dipstick MODERATE (*)    Protein, ur 100 (*)    All other components within normal limits  COMPREHENSIVE METABOLIC PANEL - Abnormal; Notable for the following components:   Potassium 3.4 (*)    Creatinine, Ser 1.95 (*)    Albumin 3.0 (*)    GFR calc non Af Amer 35 (*)    GFR calc Af Amer 40 (*)    All other components within normal limits  CBC WITH DIFFERENTIAL/PLATELET - Abnormal; Notable for the following  components:   Hemoglobin 11.5 (*)    HCT 34.9 (*)    RDW 16.0 (*)    All other components within normal limits  TROPONIN I  ETHANOL  CBG MONITORING, ED    EKG  ED ECG REPORT  I personally interpreted this EKG   Date: 01/20/2018   Rate: 97  Rhythm: normal sinus rhythm  QRS Axis: normal  Intervals: PR prolonged  ST/T Wave abnormalities: normal  Conduction Disutrbances:none  Narrative Interpretation:   Old EKG Reviewed: none available   Radiology Ct Head Wo Contrast  Result Date: 01/20/2018 CLINICAL DATA:  Fall. EXAM: CT HEAD WITHOUT CONTRAST TECHNIQUE: Contiguous axial images were obtained from the base of the skull through the vertex without intravenous contrast. COMPARISON:  None. FINDINGS: Brain: No evidence of acute infarction, hemorrhage, hydrocephalus, extra-axial collection or mass lesion/mass effect. Moderate remote cortically based infarct in the right parietooccipital region. Small remote left parietal cortex infarct. Probable remote lacunar infarct in the right medial thalamus. Vascular: Atherosclerotic calcification. Skull: No acute finding.  Hyperostosis interna Sinuses/Orbits: Negative IMPRESSION: 1. No acute finding. 2. Chronic ischemic injury as described. Electronically Signed   By: Monte Fantasia M.D.    On: 01/20/2018 14:50   Dg Hip Unilat W Or Wo Pelvis 2-3 Views Right  Result Date: 01/20/2018 CLINICAL DATA:  Acute RIGHT hip pain following fall today. Initial encounter. EXAM: DG HIP (WITH OR WITHOUT PELVIS) 2-3V RIGHT COMPARISON:  None. FINDINGS: There is no evidence of hip fracture or dislocation. There is no evidence of arthropathy or other focal bone abnormality. IMPRESSION: Negative. Electronically Signed   By: Margarette Canada M.D.   On: 01/20/2018 13:46    Procedures Procedures (including critical care time)  Medications Ordered in ED Medications - No data to display   Initial Impression / Assessment and Plan / ED Course  I have reviewed the triage vital signs and the nursing notes.  Pertinent labs & imaging results that were available during my care of the patient were reviewed by me and considered in my medical decision making (see chart for details).     It is not clear what caused the patient's weakness, he seemed to get over it fairly quickly, I would consider near syncope, transient ischemic attack, heat syncope, hypoglycemia, metabolic abnormalities, cellulitis of the legs, this could just be stasis dermatitis.  EKG labs x-ray and CT scan of the brain.  EKG shows first-degree AV block with a PR interval of 240, otherwise normal EKG without arrhythmia  I have gone over the patient's CT scan, labs and EKG with him, there is no acute findings to suggest a cause of his symptoms.  His creatinine is at baseline at 1.95, I have offered him admission to the hospital for further evaluation as this could have been a transient ischemic attack.  He has declined and states he would rather go home and follow-up with his doctor in the office.  He is aware that he can come back at any point.  He is able to state to me the reasons for return.  Final Clinical Impressions(s) / ED Diagnoses   Final diagnoses:  Near syncope  Generalized weakness  Essential hypertension    ED Discharge  Orders    None       Noemi Chapel, MD 01/20/18 [phone]

## 2018-01-23 DIAGNOSIS — I10 Essential (primary) hypertension: Secondary | ICD-10-CM | POA: Diagnosis not present

## 2018-01-23 DIAGNOSIS — Z6831 Body mass index (BMI) 31.0-31.9, adult: Secondary | ICD-10-CM | POA: Diagnosis not present

## 2018-01-23 DIAGNOSIS — R319 Hematuria, unspecified: Secondary | ICD-10-CM | POA: Diagnosis not present

## 2018-01-23 DIAGNOSIS — E11649 Type 2 diabetes mellitus with hypoglycemia without coma: Secondary | ICD-10-CM | POA: Diagnosis not present

## 2018-02-11 DIAGNOSIS — I10 Essential (primary) hypertension: Secondary | ICD-10-CM | POA: Diagnosis not present

## 2018-02-11 DIAGNOSIS — E785 Hyperlipidemia, unspecified: Secondary | ICD-10-CM | POA: Diagnosis not present

## 2018-02-11 DIAGNOSIS — I129 Hypertensive chronic kidney disease with stage 1 through stage 4 chronic kidney disease, or unspecified chronic kidney disease: Secondary | ICD-10-CM | POA: Diagnosis not present

## 2018-02-11 DIAGNOSIS — K219 Gastro-esophageal reflux disease without esophagitis: Secondary | ICD-10-CM | POA: Diagnosis not present

## 2018-02-11 DIAGNOSIS — E782 Mixed hyperlipidemia: Secondary | ICD-10-CM | POA: Diagnosis not present

## 2018-02-11 DIAGNOSIS — L89893 Pressure ulcer of other site, stage 3: Secondary | ICD-10-CM | POA: Diagnosis not present

## 2018-02-11 DIAGNOSIS — N189 Chronic kidney disease, unspecified: Secondary | ICD-10-CM | POA: Diagnosis not present

## 2018-02-11 DIAGNOSIS — E1165 Type 2 diabetes mellitus with hyperglycemia: Secondary | ICD-10-CM | POA: Diagnosis not present

## 2018-04-26 ENCOUNTER — Telehealth (HOSPITAL_COMMUNITY): Payer: Self-pay | Admitting: Physical Therapy

## 2018-04-26 ENCOUNTER — Other Ambulatory Visit: Payer: Self-pay

## 2018-04-26 ENCOUNTER — Ambulatory Visit (HOSPITAL_COMMUNITY): Payer: Medicare HMO | Attending: Internal Medicine | Admitting: Physical Therapy

## 2018-04-26 DIAGNOSIS — S91302S Unspecified open wound, left foot, sequela: Secondary | ICD-10-CM | POA: Diagnosis not present

## 2018-04-26 DIAGNOSIS — R2689 Other abnormalities of gait and mobility: Secondary | ICD-10-CM | POA: Diagnosis not present

## 2018-04-26 DIAGNOSIS — S91001D Unspecified open wound, right ankle, subsequent encounter: Secondary | ICD-10-CM

## 2018-04-26 NOTE — Therapy (Signed)
[name], [place], [address] Phone: [phone]   Fax:  [phone]  Wound Care Evaluation  Patient Details  Name: [name] MRN: [id number] Date of Birth: Oct 30, 1955 Referring Provider (PT): Allyn Kenner   Encounter Date: 04/26/2018  PT End of Session - 04/26/18 1654    Visit Number  1    Number of Visits  18    Date for PT Re-Evaluation  06/07/18    Authorization Type  [employer] medicare    PT Start Time  0900    PT Stop Time  1010    PT Time Calculation (min)  70 min    Activity Tolerance  Patient tolerated treatment well    Behavior During Therapy  [facility] for tasks assessed/performed       Past Medical History:  Diagnosis Date  . Arthritis   . Diabetes mellitus without complication ([place])   . Foot ulcer ([place])   . Hypertension     Past Surgical History:  Procedure Laterality Date  . ANKLE SURGERY    . CHOLECYSTECTOMY    . FOOT SURGERY      There were no vitals filed for this visit.    [facility] PT Assessment - 04/26/18 0001      Assessment   Medical Diagnosis  multiple wounds B LE nonhealing     Referring Provider (PT)  Allyn Kenner    Onset Date/Surgical Date  04/26/16    Next MD Visit  not scheduled     Prior Therapy  wound center for 11 months       Precautions   Precautions  Other (comment)   cellulitis     Restrictions   Weight Bearing Restrictions  No    Other Position/Activity Restrictions  --   therapist recommended pt to be using his off loading shoe.      Balance Screen   Has the patient fallen in the past 6 months  No    Has the patient had a decrease in activity level because of a fear of falling?   Yes    Is the patient reluctant to leave their home because of a fear of falling?   No      Home Film/video editor residence      Prior Function   Level of Independence  Independent with community mobility with device;Requires assistive device for independence     Vocation  Retired      Cognition   Overall Cognitive Status  Within Functional Limits for tasks assessed      Wound Therapy - 04/26/18 1453    Subjective  [name] states that he has had the ulcer on the plantar aspect of his left foot since 2017.  He went to the wound center and was in the hyperbaric O2 tank for 11 months with no results therefore he just quit going.  In the meantime he developed another wound on the outside of his Right ankle.  He went to his MD due to increased draining of his left foot wound and was referred to this facility for wound care.     Patient and Family Stated Goals  Wounds to finally heal     Date of Onset  04/26/16   approximate    Prior Treatments  wound center for 11 months.     Evaluation and Treatment Procedures Explained to Patient/Family  Yes    Evaluation and Treatment Procedures  agreed to  Wound Properties Date First Assessed: 04/26/18 Time First Assessed: 0917 Wound Type: Diabetic ulcer Location: Foot Wound Description (Comments): plantar aspect  Present on Admission: Yes   Dressing Type  Gauze (Comment)    Dressing Changed  Changed    Dressing Status  Old drainage    Dressing Change Frequency  PRN    Site / Wound Assessment  Red    % Wound base Red or Granulating  90%    % Wound base Yellow/Fibrinous Exudate  10%    Peri-wound Assessment  Edema;Maceration;Other (Comment)   calloused    Wound Length (cm)  3.5 cm    Wound Width (cm)  3.2 cm    Wound Depth (cm)  2.5 cm    Wound Volume (cm^3)  28 cm^3    Wound Surface Area (cm^2)  11.2 cm^2    Undermining (cm)  all directions at least 1 cm     Margins  Epibole (rolled edges)    Drainage Amount  Copious    Drainage Description  Serous;Odor    Treatment  Cleansed;Debridement (Selective);Other (Comment)    Wound Properties Date First Assessed: 04/26/18 Time First Assessed: 0911 Wound Type: Diabetic ulcer Location: Leg Location Orientation: Left;Anterior Wound Description (Comments): suprior  aspect    Dressing Type  None    Dressing Changed  New    Dressing Status  None    Dressing Change Frequency  PRN    Site / Wound Assessment  Dusky    % Wound base Red or Granulating  65%    % Wound base Yellow/Fibrinous Exudate  45%    Peri-wound Assessment  Edema;Erythema (blanchable)    Wound Length (cm)  1.5 cm    Wound Width (cm)  1 cm    Wound Surface Area (cm^2)  1.5 cm^2    Drainage Amount  Scant    Drainage Description  Serous    Treatment  Cleansed;Debridement (Selective);Other (Comment)    Wound Properties Date First Assessed: 04/26/18 Time First Assessed: 5284 Wound Type: Other (Comment) Location: Ankle Location Orientation: Right;Lateral Present on Admission: Yes   Dressing Type  None    Dressing Changed  New    Dressing Change Frequency  PRN    % Wound base Red or Granulating  50%    % Wound base Yellow/Fibrinous Exudate  50%    Peri-wound Assessment  Erythema (blanchable)    Wound Length (cm)  3 cm    Wound Width (cm)  4 cm    Wound Depth (cm)  0.2 cm    Wound Volume (cm^3)  2.4 cm^3    Wound Surface Area (cm^2)  12 cm^2    Margins  Attached edges (approximated)    Drainage Amount  Minimal    Drainage Description  Serous;Odor    Treatment  Cleansed;Debridement (Selective);Other (Comment)    Wound Properties Date First Assessed: 04/26/18 Time First Assessed: 0919 Wound Type: Diabetic ulcer Location: Leg Location Orientation: Right;Anterior Present on Admission: Yes   Dressing Type  None    Dressing Changed  New    Dressing Status  Old drainage    Dressing Change Frequency  PRN    Site / Wound Assessment  Dusky    % Wound base Red or Granulating  20%    % Wound base Yellow/Fibrinous Exudate  80%    Peri-wound Assessment  Erythema (blanchable)    Wound Length (cm)  2 cm    Wound Width (cm)  1.5 cm    Wound Surface Area (  cm^2)  3 cm^2    Drainage Amount  None    Treatment  Cleansed;Debridement (Selective);Other (Comment)    Selective Debridement - Location   calloused wound edges and margin of  all wounds     Selective Debridement - Tools Used  Forceps;Scissors    Selective Debridement - Tissue Removed  callous, slough, devitalized tissue     Wound Therapy - Clinical Statement  [name] is a 62 yo diabetic who has had ulcers on his legs off and on for several years.  He has an ulcer on the plantar aspect of his Lt foot that has been there since 2017.  This wound and a new wound on the lateral aspect of his right ankle are deteriorating therefore he went to the MD who referred him to this facility.  He has tried various ointments and was in the hyperbaric O2 tank for 11 months with no improvement.  Mr. Tumolo LE are now red, swollen and the drainage has a foul odor.  He will benefit from skilled physical therapy wound care to improve the healing environment and promote healing in order to avoid cellulits at this time.      Wound Therapy - Functional Problem List  Difficult to ambulate; pt is wallking on the side of his left foot.     Factors Delaying/Impairing Wound Healing  Altered sensation;Diabetes Mellitus;Infection - systemic/local;Multiple medical problems;Polypharmacy    Hydrotherapy Plan  Debridement;Dressing change;Patient/family education;Pulsatile lavage with suction    Wound Therapy - Frequency  3X / week   6 weeks    Wound Therapy - Current Recommendations  PT    Wound Plan  Due LE being red, wound increasing in size and drainage, drainage having a foul smell therapist requested an antibiotic from MD.  Wounds will need to be debrided, Lt foot wound may need pulse lavage prn, due to heavy drainage we will use a silver hydrofiber dressing followed by profore compression bandaging to decrease edema . Therapist recommended pt to bring an antifungal cream for Lt toes next treatment.      Dressing   silver hydrofiber to lateral Rt LE and plantar LT LE wound; xeroform to all other wounds followed by profore and toe wrapping to B LE>               Objective measurements completed on examination: See above findings.            PT Education - 04/26/18 1652    Education Details  do not get bandaging  wet; if bandaging is painful remove.     Person(s) Educated  Patient    Methods  Explanation    Comprehension  Verbalized understanding       PT Short Term Goals - 04/26/18 1656      PT SHORT TERM GOAL #1   Title  PT drainage to decrease to minimal on Rt ankle and LT plantar wounds to decrease cellulitis B     Time  1    Period  Weeks    Status  New    Target Date  05/03/18      PT SHORT TERM GOAL #2   Title  PT to be wearing his off loading shoe     Time  1    Period  Weeks    Status  New      PT SHORT TERM GOAL #3   Title  Callous to be debrided from LT plantar foot wound to improve wound healing enviornment  Time  3    Period  Weeks    Status  New    Target Date  05/17/18      PT SHORT TERM GOAL #4   Title  Anterior leg wounds to be healed     Time  3    Period  Weeks    Status  New    Target Date  05/17/18        PT Long Term Goals - 04/26/18 1700      PT LONG TERM GOAL #1   Title  PT RT ankle wound and Lt plantar wound to be 100% granulated to prevent infection     Time  3    Period  Weeks    Status  New    Target Date  05/17/18      PT LONG TERM GOAL #2   Title  PT depth of LT plantar wound to be no greater than 1.0 to demonstrate wound healing.     Time  6    Period  Weeks    Status  New    Target Date  06/07/18      PT LONG TERM GOAL #3   Title  PT wound size on both the above wounds to have decreased by at least 1.5 cm diameter to demonstrate improved wound healing.     Time  6    Period  Weeks    Status  New      PT LONG TERM GOAL #4   Title  PT to have a compression garment and be using on a regular basis to keep LE from increasing edema which will delay wound healing.    Time  6    Period  Weeks    Status  New           Plan - 04/26/18 1654     Clinical Impression Statement  see above     History and Personal Factors relevant to plan of care:  DM, HTN     Clinical Presentation  Evolving    Clinical Decision Making  Moderate    Rehab Potential  Fair    PT Frequency  3x / week    PT Duration  6 weeks    PT Treatment/Interventions  Other (comment);ADLs/Self Care Home Management;Patient/family education   debridement and dressing change.    PT Next Visit Plan  culture wound order recieved from Dr. Nevada Crane continue with debridement and compression dressing     Consulted and Agree with Plan of Care  Patient       Patient will benefit from skilled therapeutic intervention in order to improve the following deficits and impairments:  Abnormal gait, Decreased activity tolerance, Increased edema, Decreased skin integrity  Visit Diagnosis: Open wound of right ankle with complication, subsequent encounter  Other abnormalities of gait and mobility  Open wound of plantar aspect of foot, left, sequela    Problem List There are no active problems to display for this patient.   Rayetta Humphrey, PT CLT [phone] 04/26/2018, 5:06 PM  [facility] [address] [place], [place], [address] Phone: [phone]   Fax:  [phone]  Name: [name] MRN: [id number] Date of Birth: 1955/10/17

## 2018-04-26 NOTE — Telephone Encounter (Signed)
[facility] called to get rept on wound-CR states bilateral extermities swelling, Red 3/4 down leg to toes, High value of drainage and smells like rotten meat. NF 04/26/2018

## 2018-04-26 NOTE — Telephone Encounter (Signed)
We got a verabal order to treat both legs. WT

## 2018-04-29 ENCOUNTER — Telehealth (HOSPITAL_COMMUNITY): Payer: Self-pay | Admitting: Physical Therapy

## 2018-04-29 ENCOUNTER — Other Ambulatory Visit (HOSPITAL_COMMUNITY)
Admission: RE | Admit: 2018-04-29 | Discharge: 2018-04-29 | Disposition: A | Discharge status: Home / Self Care | Payer: Medicare HMO | Source: Ambulatory Visit | Attending: Dermatology | Admitting: Dermatology

## 2018-04-29 ENCOUNTER — Ambulatory Visit (HOSPITAL_COMMUNITY): Payer: Medicare HMO | Admitting: Physical Therapy

## 2018-04-29 ENCOUNTER — Telehealth (HOSPITAL_COMMUNITY): Payer: Self-pay | Admitting: Internal Medicine

## 2018-04-29 DIAGNOSIS — S91001D Unspecified open wound, right ankle, subsequent encounter: Secondary | ICD-10-CM | POA: Diagnosis not present

## 2018-04-29 DIAGNOSIS — S91301A Unspecified open wound, right foot, initial encounter: Secondary | ICD-10-CM | POA: Diagnosis not present

## 2018-04-29 DIAGNOSIS — R2689 Other abnormalities of gait and mobility: Secondary | ICD-10-CM | POA: Diagnosis not present

## 2018-04-29 DIAGNOSIS — S91302S Unspecified open wound, left foot, sequela: Secondary | ICD-10-CM

## 2018-04-29 NOTE — Telephone Encounter (Signed)
Called to see if pt could come at an earlier time.  PT agreed. Rayetta Humphrey, [place] CLT [phone]

## 2018-04-29 NOTE — Telephone Encounter (Signed)
04/29/18  8:19 left a message to offer a 10:30 appt time today

## 2018-04-29 NOTE — Therapy (Signed)
[facility] [place], [place], [address] Phone: [phone]   Fax:  [phone]  Wound Care Therapy  Patient Details  Name: Zyshonne Malecha MRN: [id number] Date of Birth: 06-Oct-1955 Referring Provider (PT): Allyn Kenner   Encounter Date: 04/29/2018  PT End of Session - 04/29/18 1300    Visit Number  2    Number of Visits  18    Date for PT Re-Evaluation  06/07/18    Authorization Type  [employer] medicare    PT Start Time  1140    PT Stop Time  1225    PT Time Calculation (min)  45 min    Activity Tolerance  Patient tolerated treatment well    Behavior During Therapy  [facility] for tasks assessed/performed       Past Medical History:  Diagnosis Date  . Arthritis   . Diabetes mellitus without complication ([place])   . Foot ulcer ([place])   . Hypertension     Past Surgical History:  Procedure Laterality Date  . ANKLE SURGERY    . CHOLECYSTECTOMY    . FOOT SURGERY      There were no vitals filed for this visit.              Wound Therapy - 04/29/18 1253    Subjective  Mr. Mierzwa states that the compression bandaging was comfortable.  Pt brought in fungus cream as recommended.    Patient and Family Stated Goals  Wounds to finally heal     Date of Onset  04/26/16   approximate    Prior Treatments  wound center for 11 months.     Evaluation and Treatment Procedures Explained to Patient/Family  Yes    Evaluation and Treatment Procedures  agreed to    Wound Properties Date First Assessed: 04/26/18 Time First Assessed: 0917 Wound Type: Diabetic ulcer Location: Foot Wound Description (Comments): plantar aspect  Present on Admission: Yes   Dressing Type  Gauze (Comment)    Dressing Changed  Changed    Dressing Status  Old drainage    Dressing Change Frequency  PRN    Site / Wound Assessment  Red    % Wound base Red or Granulating  95%    % Wound base Yellow/Fibrinous Exudate  5%    Peri-wound Assessment  Edema;Maceration;Other  (Comment)   calloused    Margins  Epibole (rolled edges)    Drainage Amount  Minimal    Drainage Description  Serous    Treatment  Cleansed;Debridement (Selective)    Wound Properties Date First Assessed: 04/26/18 Time First Assessed: 0911 Wound Type: Diabetic ulcer Location: Leg Location Orientation: Left;Anterior Wound Description (Comments): suprior aspect    Dressing Type  None    Dressing Status  None    Dressing Change Frequency  PRN    Site / Wound Assessment  Dusky;Granulation tissue    % Wound base Red or Granulating  95%    % Wound base Yellow/Fibrinous Exudate  5%    Peri-wound Assessment  Edema;Erythema (blanchable)    Drainage Amount  None    Drainage Description  --    Treatment  Cleansed    Wound Properties Date First Assessed: 04/26/18 Time First Assessed: 0919 Wound Type: Diabetic ulcer Location: Leg Location Orientation: Right;Anterior Present on Admission: Yes   Dressing Type  None    Dressing Status  --    Dressing Change Frequency  PRN    Site / Wound Assessment  Granulation  tissue    % Wound base Red or Granulating  80%    % Wound base Yellow/Fibrinous Exudate  20%    Peri-wound Assessment  Erythema (blanchable)    Drainage Amount  None    Treatment  Cleansed;Debridement (Selective)    Wound Properties Date First Assessed: 04/26/18 Time First Assessed: 8119 Wound Type: Other (Comment) Location: Ankle Location Orientation: Right;Lateral Present on Admission: Yes   Dressing Type  None    Dressing Change Frequency  PRN    % Wound base Red or Granulating  75%    % Wound base Yellow/Fibrinous Exudate  25%    Peri-wound Assessment  Erythema (blanchable)    Margins  Attached edges (approximated)    Drainage Amount  Minimal    Drainage Description  Serous    Treatment  Cleansed    Selective Debridement - Location  callouse wound edges and margin of  all wounds     Selective Debridement - Tools Used  Forceps;Scissors    Selective Debridement - Tissue Removed   callous, slough, devitalized tissue     Wound Therapy - Clinical Statement  PT LT plantar wound was cultured following cleansing.  Noted decreased edema and improved granulation with LE and all wounds repectively.  Therapist noted small wounds on B toes therefore changed from profore to profore lite compression dressing and will request an ABI from the MD to check arterial flow as pt states that this has never been checked    Wound Therapy - Functional Problem List  Difficult to ambulate; pt is wallking on the side of his left foot.     Factors Delaying/Impairing Wound Healing  Altered sensation;Diabetes Mellitus;Infection - systemic/local;Multiple medical problems;Polypharmacy    Hydrotherapy Plan  Debridement;Dressing change;Patient/family education;Pulsatile lavage with suction    Wound Therapy - Frequency  3X / week   6 weeks    Wound Therapy - Current Recommendations  PT    Wound Plan  Due LE being red, wound increasing in size and drainage, drainage having a foul smell therapist requested an antibiotic from MD.  Wounds will need to be debrided, Lt foot wound may need pulse lavage prn, due to heavy drainage we will use a silver hydrofiber dressing followed by profore compression bandaging to decrease edema . Therapist recommended pt to bring an antifungal cream for Lt toes next treatment.      Dressing   silver hydrofiber to lateral Rt LE and plantar LT LE wound; xeroform to all other wounds followed by profore lite compression bandage system.                PT Short Term Goals - 04/29/18 1300      PT SHORT TERM GOAL #1   Title  PT drainage to decrease to minimal on Rt ankle and LT plantar wounds to decrease cellulitis B     Time  1    Period  Weeks    Status  On-going      PT SHORT TERM GOAL #2   Title  PT to be wearing his off loading shoe     Time  1    Period  Weeks    Status  On-going      PT SHORT TERM GOAL #3   Title  Callous to be debrided from LT plantar foot wound  to improve wound healing enviornment     Time  3    Period  Weeks    Status  On-going  PT SHORT TERM GOAL #4   Title  Anterior leg wounds to be healed     Time  3    Period  Weeks    Status  On-going        PT Long Term Goals - 04/29/18 1301      PT LONG TERM GOAL #1   Title  PT RT ankle wound and Lt plantar wound to be 100% granulated to prevent infection     Time  3    Period  Weeks    Status  On-going      PT LONG TERM GOAL #2   Title  PT depth of LT plantar wound to be no greater than 1.0 to demonstrate wound healing.     Time  6    Period  Weeks    Status  On-going      PT LONG TERM GOAL #3   Title  PT wound size on both the above wounds to have decreased by at least 1.5 cm diameter to demonstrate improved wound healing.     Time  6    Period  Weeks    Status  On-going      PT LONG TERM GOAL #4   Title  PT to have a compression garment and be using on a regular basis to keep LE from increasing edema which will delay wound healing.    Time  6    Period  Weeks    Status  On-going            Plan - 04/29/18 1300    Clinical Impression Statement  see above     Rehab Potential  Fair    PT Frequency  3x / week    PT Duration  6 weeks    PT Treatment/Interventions  Other (comment);ADLs/Self Care Home Management;Patient/family education   debridement and dressing change.    PT Next Visit Plan  culture wound order recieved from Dr. Nevada Crane continue with debridement and compression dressing     Consulted and Agree with Plan of Care  Patient       Patient will benefit from skilled therapeutic intervention in order to improve the following deficits and impairments:  Abnormal gait, Decreased activity tolerance, Increased edema, Decreased skin integrity  Visit Diagnosis: Open wound of right ankle with complication, subsequent encounter  Other abnormalities of gait and mobility  Open wound of plantar aspect of foot, left, sequela     Problem List There  are no active problems to display for this patient.  Rayetta Humphrey, PT CLT [phone] 04/29/2018, 1:02 PM  [facility] [address] [place], [place], [address] Phone: [phone]   Fax:  [phone]  Name: [place] MRN: [id number] Date of Birth: [birth year]

## 2018-05-01 ENCOUNTER — Other Ambulatory Visit (HOSPITAL_COMMUNITY): Payer: Self-pay | Admitting: Adult Health Nurse Practitioner

## 2018-05-01 ENCOUNTER — Ambulatory Visit (HOSPITAL_COMMUNITY): Payer: Medicare HMO | Admitting: Physical Therapy

## 2018-05-01 DIAGNOSIS — E1165 Type 2 diabetes mellitus with hyperglycemia: Secondary | ICD-10-CM | POA: Diagnosis not present

## 2018-05-01 DIAGNOSIS — E11649 Type 2 diabetes mellitus with hypoglycemia without coma: Secondary | ICD-10-CM | POA: Diagnosis not present

## 2018-05-01 DIAGNOSIS — S91001D Unspecified open wound, right ankle, subsequent encounter: Secondary | ICD-10-CM | POA: Diagnosis not present

## 2018-05-01 DIAGNOSIS — R2689 Other abnormalities of gait and mobility: Secondary | ICD-10-CM

## 2018-05-01 DIAGNOSIS — E782 Mixed hyperlipidemia: Secondary | ICD-10-CM | POA: Diagnosis not present

## 2018-05-01 DIAGNOSIS — S91302S Unspecified open wound, left foot, sequela: Secondary | ICD-10-CM | POA: Diagnosis not present

## 2018-05-01 DIAGNOSIS — I10 Essential (primary) hypertension: Secondary | ICD-10-CM | POA: Diagnosis not present

## 2018-05-01 DIAGNOSIS — L8990 Pressure ulcer of unspecified site, unspecified stage: Secondary | ICD-10-CM

## 2018-05-01 DIAGNOSIS — E785 Hyperlipidemia, unspecified: Secondary | ICD-10-CM | POA: Diagnosis not present

## 2018-05-01 LAB — AEROBIC CULTURE W GRAM STAIN (SUPERFICIAL SPECIMEN): Gram Stain: NONE SEEN

## 2018-05-01 LAB — AEROBIC CULTURE  (SUPERFICIAL SPECIMEN)

## 2018-05-01 NOTE — Therapy (Signed)
[place], [place], [address] Phone: [phone]   Fax:  [phone]  Wound Care Therapy  Patient Details  Name: Danny [name] MRN: [id number] Date of Birth: 08/08/1955 Referring Provider (PT): Allyn Kenner   Encounter Date: 05/01/2018  PT End of Session - 05/01/18 1459    Visit Number  3    Number of Visits  18    Date for PT Re-Evaluation  06/07/18    Authorization Type  [employer]    PT Start Time  1345    PT Stop Time  1445    PT Time Calculation (min)  60 min    Activity Tolerance  Patient tolerated treatment well    Behavior During Therapy  [facility] for tasks assessed/performed       Past Medical History:  Diagnosis Date  . Arthritis   . Diabetes mellitus without complication ([place])   . Foot ulcer ([place])   . Hypertension     Past Surgical History:  Procedure Laterality Date  . ANKLE SURGERY    . CHOLECYSTECTOMY    . FOOT SURGERY      There were no vitals filed for this visit.              Wound Therapy - 05/01/18 1453    Subjective  PT states he has not heard from his MD or radiology re the ABI study; he has no pain at this time.  Bandages stayed on well     Patient and Family Stated Goals  Wounds to finally heal     Date of Onset  04/26/16   approximate    Prior Treatments  wound center for 11 months.     Evaluation and Treatment Procedures Explained to Patient/Family  Yes    Evaluation and Treatment Procedures  agreed to    Wound Properties Date First Assessed: 04/26/18 Time First Assessed: 0917 Wound Type: Diabetic ulcer Location: Foot Wound Description (Comments): plantar aspect  Present on Admission: Yes   Dressing Type  Gauze (Comment)    Dressing Status  Old drainage    Dressing Change Frequency  PRN    Site / Wound Assessment  Red    % Wound base Red or Granulating  95%    % Wound base Yellow/Fibrinous Exudate  5%    Peri-wound Assessment  Edema;Maceration;Other (Comment)    calloused    Margins  Epibole (rolled edges)    Drainage Amount  Minimal    Drainage Description  Serous    Treatment  Cleansed;Debridement (Selective)    Wound Properties Date First Assessed: 04/26/18 Time First Assessed: 0911 Wound Type: Diabetic ulcer Location: Leg Location Orientation: Left;Anterior Wound Description (Comments): suprior aspect    Dressing Type  None    Dressing Status  None    Dressing Change Frequency  PRN    Site / Wound Assessment  Dusky;Granulation tissue    % Wound base Red or Granulating  100%    % Wound base Yellow/Fibrinous Exudate  0%    Peri-wound Assessment  Edema;Erythema (blanchable)    Drainage Amount  None    Treatment  Cleansed    Wound Properties Date First Assessed: 04/26/18 Time First Assessed: 0919 Wound Type: Diabetic ulcer Location: Leg Location Orientation: Right;Anterior Present on Admission: Yes   Dressing Type  None    Dressing Change Frequency  PRN    Site / Wound Assessment  Granulation tissue    % Wound base Red or Granulating  100%    % Wound base Yellow/Fibrinous Exudate  0%    Peri-wound Assessment  Erythema (blanchable)    Drainage Amount  None    Treatment  Cleansed    Wound Properties Date First Assessed: 04/26/18 Time First Assessed: 6761 Wound Type: Other (Comment) Location: Ankle Location Orientation: Right;Lateral Present on Admission: Yes   Dressing Type  None    Dressing Change Frequency  PRN    % Wound base Red or Granulating  80%    % Wound base Yellow/Fibrinous Exudate  20%    Peri-wound Assessment  Erythema (blanchable)    Margins  Epibole (rolled edges)    Drainage Amount  Minimal    Drainage Description  Serous    Treatment  Cleansed;Debridement (Selective)    Selective Debridement - Location  callouse wound edges and margin of  all wounds     Selective Debridement - Tools Used  Forceps;Scissors    Selective Debridement - Tissue Removed  biofilm from Rt ankle wound, callous, slough, devitalized tissue     Wound  Therapy - Clinical Statement  PT anterior wounds are almost healed.  Pt Rt ankle and LT plantar foot wound have decreased slough but ankle continues to develop a biofilm.     Wound Therapy - Functional Problem List  Difficult to ambulate; pt is wallking on the side of his left foot.     Factors Delaying/Impairing Wound Healing  Altered sensation;Diabetes Mellitus;Infection - systemic/local;Multiple medical problems;Polypharmacy    Hydrotherapy Plan  Debridement;Dressing change;Patient/family education;Pulsatile lavage with suction    Wound Therapy - Frequency  3X / week   6 weeks    Wound Therapy - Current Recommendations  PT    Wound Plan  Recommend ABI, this recommendation was faxed to MD on 11/4.  Remeasure wounds next treatment.      Dressing   silver hydrofiber to lateral Rt LE and plantar LT LE wound; xeroform to all other wounds followed by profore lite compression bandage system.                PT Short Term Goals - 04/29/18 1300      PT SHORT TERM GOAL #1   Title  PT drainage to decrease to minimal on Rt ankle and LT plantar wounds to decrease cellulitis B     Time  1    Period  Weeks    Status  On-going      PT SHORT TERM GOAL #2   Title  PT to be wearing his off loading shoe     Time  1    Period  Weeks    Status  On-going      PT SHORT TERM GOAL #3   Title  Callous to be debrided from LT plantar foot wound to improve wound healing enviornment     Time  3    Period  Weeks    Status  On-going      PT SHORT TERM GOAL #4   Title  Anterior leg wounds to be healed     Time  3    Period  Weeks    Status  On-going        PT Long Term Goals - 04/29/18 1301      PT LONG TERM GOAL #1   Title  PT RT ankle wound and Lt plantar wound to be 100% granulated to prevent infection     Time  3    Period  Weeks    Status  On-going      PT LONG TERM GOAL #2   Title  PT depth of LT plantar wound to be no greater than 1.0 to demonstrate wound healing.     Time  6     Period  Weeks    Status  On-going      PT LONG TERM GOAL #3   Title  PT wound size on both the above wounds to have decreased by at least 1.5 cm diameter to demonstrate improved wound healing.     Time  6    Period  Weeks    Status  On-going      PT LONG TERM GOAL #4   Title  PT to have a compression garment and be using on a regular basis to keep LE from increasing edema which will delay wound healing.    Time  6    Period  Weeks    Status  On-going            Plan - 05/01/18 1459    Clinical Impression Statement  as above     Rehab Potential  Fair    PT Frequency  3x / week    PT Duration  6 weeks    PT Treatment/Interventions  Other (comment);ADLs/Self Care Home Management;Patient/family education   debridement and dressing change.    PT Next Visit Plan  continue with debridement and dressing; follow up on ABI request.    Consulted and Agree with Plan of Care  Patient       Patient will benefit from skilled therapeutic intervention in order to improve the following deficits and impairments:  Abnormal gait, Decreased activity tolerance, Increased edema, Decreased skin integrity  Visit Diagnosis: Open wound of right ankle with complication, subsequent encounter  Other abnormalities of gait and mobility  Open wound of plantar aspect of foot, left, sequela     Problem List There are no active problems to display for this patient.  Rayetta Humphrey, PT CLT [phone] 05/01/2018, 3:00 PM  [facility] [address] [place], [place], [address] Phone: [phone]   Fax:  [phone]  Name: Danny [name] MRN: [id number] Date of Birth: Nov 24, 1955

## 2018-05-03 ENCOUNTER — Ambulatory Visit (HOSPITAL_COMMUNITY): Payer: Medicare HMO | Admitting: Physical Therapy

## 2018-05-03 DIAGNOSIS — R2689 Other abnormalities of gait and mobility: Secondary | ICD-10-CM | POA: Diagnosis not present

## 2018-05-03 DIAGNOSIS — S91001D Unspecified open wound, right ankle, subsequent encounter: Secondary | ICD-10-CM | POA: Diagnosis not present

## 2018-05-03 DIAGNOSIS — S91302S Unspecified open wound, left foot, sequela: Secondary | ICD-10-CM

## 2018-05-03 NOTE — Therapy (Signed)
[place], [place], [address] Phone: [phone]   Fax:  [phone]  Wound Care Therapy  Patient Details  Name: Danny [name] MRN: [id number] Date of Birth: May 28, 1956 Referring Provider (PT): Allyn Kenner   Encounter Date: 05/03/2018  PT End of Session - 05/03/18 1351    Visit Number  4    Number of Visits  18    Date for PT Re-Evaluation  06/07/18    Authorization Type  [employer]    PT Start Time  1204    PT Stop Time  1315    PT Time Calculation (min)  71 min    Activity Tolerance  Patient tolerated treatment well    Behavior During Therapy  [facility] for tasks assessed/performed       Past Medical History:  Diagnosis Date  . Arthritis   . Diabetes mellitus without complication ([place])   . Foot ulcer ([place])   . Hypertension     Past Surgical History:  Procedure Laterality Date  . ANKLE SURGERY    . CHOLECYSTECTOMY    . FOOT SURGERY      There were no vitals filed for this visit.              Wound Therapy - 05/03/18 1339    Subjective  PT states he has not heard from his MD or radiology re the ABI study; he has no pain at this time.  Bandages stayed on well     Patient and Family Stated Goals  Wounds to finally heal     Date of Onset  04/26/16   approximate    Prior Treatments  wound center for 11 months.     Evaluation and Treatment Procedures Explained to Patient/Family  Yes    Evaluation and Treatment Procedures  agreed to    Wound Properties Date First Assessed: 04/26/18 Time First Assessed: 0917 Wound Type: Diabetic ulcer Location: Foot Wound Description (Comments): plantar aspect  Present on Admission: Yes   Dressing Type  Gauze (Comment)    Dressing Changed  Changed    Dressing Status  Old drainage    Dressing Change Frequency  PRN    Site / Wound Assessment  Red    % Wound base Red or Granulating  95%    % Wound base Yellow/Fibrinous Exudate  5%    Peri-wound Assessment   Edema;Other (Comment)   calloused    Wound Length (cm)  2.5 cm    Wound Width (cm)  3 cm    Wound Depth (cm)  2.5 cm    Wound Volume (cm^3)  18.75 cm^3    Wound Surface Area (cm^2)  7.5 cm^2    Margins  Epibole (rolled edges)    Drainage Amount  Minimal    Drainage Description  Serous    Treatment  Cleansed;Debridement (Selective)    Wound Properties Date First Assessed: 04/26/18 Time First Assessed: 0911 Wound Type: Diabetic ulcer Location: Leg Location Orientation: Left;Anterior Wound Description (Comments): suprior aspect    Dressing Type  None    Dressing Changed  Changed    Dressing Status  None    Dressing Change Frequency  PRN    Site / Wound Assessment  Dusky;Granulation tissue    % Wound base Red or Granulating  100%    % Wound base Yellow/Fibrinous Exudate  0%    Peri-wound Assessment  Edema;Erythema (blanchable)    Wound Length (cm)  1.4 cm  Wound Width (cm)  0.8 cm    Wound Surface Area (cm^2)  1.12 cm^2    Drainage Amount  None    Treatment  Cleansed;Debridement (Selective)    Wound Properties Date First Assessed: 04/26/18 Time First Assessed: 0919 Wound Type: Diabetic ulcer Location: Leg Location Orientation: Right;Anterior Present on Admission: Yes   Dressing Type  None    Dressing Change Frequency  PRN    Site / Wound Assessment  Granulation tissue    % Wound base Red or Granulating  100%    % Wound base Yellow/Fibrinous Exudate  0%    Peri-wound Assessment  Erythema (blanchable)    Wound Length (cm)  0.5 cm    Wound Width (cm)  0.2 cm    Wound Depth (cm)  0 cm    Wound Volume (cm^3)  0 cm^3    Wound Surface Area (cm^2)  0.1 cm^2    Drainage Amount  None    Wound Properties Date First Assessed: 04/26/18 Time First Assessed: 1062 Wound Type: Other (Comment) Location: Ankle Location Orientation: Right;Lateral Present on Admission: Yes   Dressing Type  None    Dressing Change Frequency  PRN    % Wound base Red or Granulating  80%    % Wound base  Yellow/Fibrinous Exudate  20%    Peri-wound Assessment  Erythema (blanchable)    Wound Length (cm)  2.5 cm    Wound Width (cm)  3.5 cm    Wound Depth (cm)  0.2 cm    Wound Volume (cm^3)  1.75 cm^3    Wound Surface Area (cm^2)  8.75 cm^2    Margins  Epibole (rolled edges)    Drainage Amount  Minimal    Drainage Description  Serous    Treatment  Cleansed;Debridement (Selective)    Selective Debridement - Location  callouse wound edges and margin of  all wounds     Selective Debridement - Tools Used  Forceps;Scissors    Selective Debridement - Tissue Removed  biofilm from Rt ankle wound, callous, slough, devitalized tissue     Wound Therapy - Clinical Statement  Dressing had slid off of plantar Lt foot wound.  All wounds remeasured this session with overall reduction in size of all. Noted reduction of drainage Rt ankle and plantar wound.  Changed dressings to medihoney gel to promote increase in moisture and granluation.  continued with profore lite dressings. Moisturized LE well and applied antifungal to Lt toes/plantar surface only.  also utilized toe wraps on Lt to keep dressing intact.on plantar surface.      Wound Therapy - Functional Problem List  Difficult to ambulate; pt is wallking on the side of his left foot.     Factors Delaying/Impairing Wound Healing  Altered sensation;Diabetes Mellitus;Infection - systemic/local;Multiple medical problems;Polypharmacy    Hydrotherapy Plan  Debridement;Dressing change;Patient/family education;Pulsatile lavage with suction    Wound Therapy - Frequency  3X / week   6 weeks    Wound Therapy - Current Recommendations  PT    Wound Plan  Recommend ABI, this recommendation was faxed to MD on 11/4.  Remeasure wounds each week.  Discuss with evaluating therapist pressure relief options for Rt ankle.     Dressing   medihoney to lateral Rt LE and plantar LT LE wound; xeroform to all other wounds followed by profore lite compression bandage system.                 PT Short Term Goals - 04/29/18 1300  PT SHORT TERM GOAL #1   Title  PT drainage to decrease to minimal on Rt ankle and LT plantar wounds to decrease cellulitis B     Time  1    Period  Weeks    Status  On-going      PT SHORT TERM GOAL #2   Title  PT to be wearing his off loading shoe     Time  1    Period  Weeks    Status  On-going      PT SHORT TERM GOAL #3   Title  Callous to be debrided from LT plantar foot wound to improve wound healing enviornment     Time  3    Period  Weeks    Status  On-going      PT SHORT TERM GOAL #4   Title  Anterior leg wounds to be healed     Time  3    Period  Weeks    Status  On-going        PT Long Term Goals - 04/29/18 1301      PT LONG TERM GOAL #1   Title  PT RT ankle wound and Lt plantar wound to be 100% granulated to prevent infection     Time  3    Period  Weeks    Status  On-going      PT LONG TERM GOAL #2   Title  PT depth of LT plantar wound to be no greater than 1.0 to demonstrate wound healing.     Time  6    Period  Weeks    Status  On-going      PT LONG TERM GOAL #3   Title  PT wound size on both the above wounds to have decreased by at least 1.5 cm diameter to demonstrate improved wound healing.     Time  6    Period  Weeks    Status  On-going      PT LONG TERM GOAL #4   Title  PT to have a compression garment and be using on a regular basis to keep LE from increasing edema which will delay wound healing.    Time  6    Period  Weeks    Status  On-going              Patient will benefit from skilled therapeutic intervention in order to improve the following deficits and impairments:     Visit Diagnosis: No diagnosis found.     Problem List There are no active problems to display for this patient. Teena Irani, PTA/CLT [phone]   Teena Irani 05/03/2018, 4:08 PM  [facility] [address] [place], [place],  [address] Phone: [phone]   Fax:  [phone]  Name: Carmon Sahli MRN: [id number] Date of Birth: 02-Mar-1956

## 2018-05-06 ENCOUNTER — Ambulatory Visit (HOSPITAL_COMMUNITY): Payer: Medicare HMO | Admitting: Physical Therapy

## 2018-05-06 DIAGNOSIS — S91302S Unspecified open wound, left foot, sequela: Secondary | ICD-10-CM | POA: Diagnosis not present

## 2018-05-06 DIAGNOSIS — R2689 Other abnormalities of gait and mobility: Secondary | ICD-10-CM

## 2018-05-06 DIAGNOSIS — S91001D Unspecified open wound, right ankle, subsequent encounter: Secondary | ICD-10-CM | POA: Diagnosis not present

## 2018-05-06 NOTE — Therapy (Signed)
[facility] [place], [place], [address] Phone: [phone]   Fax:  [phone]  Wound Care Therapy  Patient Details  Name: Danny [name] MRN: [id number] Date of Birth: Jul 15, 1955 Referring Provider (PT): Allyn Kenner   Encounter Date: 05/06/2018  PT End of Session - 05/06/18 1824    Visit Number  5    Number of Visits  18    Date for PT Re-Evaluation  06/07/18    Authorization Type  [employer]    PT Start Time  1650    PT Stop Time  1750    PT Time Calculation (min)  60 min    Activity Tolerance  Patient tolerated treatment well    Behavior During Therapy  [facility] for tasks assessed/performed       Past Medical History:  Diagnosis Date  . Arthritis   . Diabetes mellitus without complication ([place])   . Foot ulcer ([place])   . Hypertension     Past Surgical History:  Procedure Laterality Date  . ANKLE SURGERY    . CHOLECYSTECTOMY    . FOOT SURGERY      There were no vitals filed for this visit.              Wound Therapy - 05/06/18 1818    Subjective  Pt returns today without issues.  States he is getting his ABI completed tomorrow and has MD appt Wednesday prior to appt here.    Patient and Family Stated Goals  Wounds to finally heal     Date of Onset  04/26/16   approximate    Prior Treatments  wound center for 11 months.     Evaluation and Treatment Procedures Explained to Patient/Family  Yes    Evaluation and Treatment Procedures  agreed to    Wound Properties Date First Assessed: 04/26/18 Time First Assessed: 0917 Wound Type: Diabetic ulcer Location: Foot Wound Description (Comments): plantar aspect  Present on Admission: Yes   Dressing Type  Gauze (Comment)    Dressing Changed  Changed    Dressing Status  Old drainage    Dressing Change Frequency  PRN    Site / Wound Assessment  Red    % Wound base Red or Granulating  95%    % Wound base Yellow/Fibrinous Exudate  5%    Peri-wound Assessment   Edema;Other (Comment)   calloused    Margins  Epibole (rolled edges)    Drainage Amount  Minimal    Drainage Description  Serous    Treatment  Cleansed;Debridement (Selective)    Wound Properties Date First Assessed: 04/26/18 Time First Assessed: 0911 Wound Type: Diabetic ulcer Location: Leg Location Orientation: Left;Anterior Wound Description (Comments): suprior aspect    Dressing Type  Gauze (Comment)    Dressing Changed  Changed    Dressing Status  None    Dressing Change Frequency  PRN    Site / Wound Assessment  Dusky;Granulation tissue    % Wound base Red or Granulating  100%    % Wound base Yellow/Fibrinous Exudate  0%    Peri-wound Assessment  Edema;Erythema (blanchable)    Drainage Amount  None    Treatment  Cleansed;Debridement (Selective)    Wound Properties Date First Assessed: 04/26/18 Time First Assessed: 0919 Wound Type: Diabetic ulcer Location: Leg Location Orientation: Right;Anterior Present on Admission: Yes   Dressing Type  --    Dressing Change Frequency  --    Site / Wound Assessment  --    %  Wound base Red or Granulating  --    % Wound base Yellow/Fibrinous Exudate  --    Peri-wound Assessment  --    Drainage Amount  --    Wound Properties Date First Assessed: 04/26/18 Time First Assessed: 1610 Wound Type: Other (Comment) Location: Ankle Location Orientation: Right;Lateral Present on Admission: Yes   Dressing Type  Gauze (Comment)    Dressing Changed  Changed    Dressing Change Frequency  PRN    % Wound base Red or Granulating  90%    % Wound base Yellow/Fibrinous Exudate  10%    Peri-wound Assessment  Erythema (blanchable)    Margins  Epibole (rolled edges)    Drainage Amount  Minimal    Drainage Description  Serous    Treatment  Cleansed;Debridement (Selective)    Selective Debridement - Location  Wounds continue to stay granulated, improve.  Able to debride more callous from edges of Rt ankle and Lt Plantar foot wounds.  small area remains on Lt anterior  LE, otherwise no other wounds.  Instructed pateint to mention to MD at appt regarding multipodus boot to help avoid pressure to Rt lateral ankle.      Selective Debridement - Tools Used  Forceps;Scissors    Selective Debridement - Tissue Removed  biofilm from Rt ankle wound, callous, slough, devitalized tissue     Wound Therapy - Clinical Statement  Dressing had slid off of plantar Lt foot wound.  All wounds remeasured this session with overall reduction in size of all. Noted reduction of drainage Rt ankle and plantar wound.  Changed dressings to medihoney gel to promote increase in moisture and granluation.  continued with profore lite dressings. Moisturized LE well and applied antifungal to Lt toes/plantar surface only.  also utilized toe wraps on Lt to keep dressing intact.on plantar surface.      Wound Therapy - Functional Problem List  Difficult to ambulate; pt is wallking on the side of his left foot.     Factors Delaying/Impairing Wound Healing  Altered sensation;Diabetes Mellitus;Infection - systemic/local;Multiple medical problems;Polypharmacy    Hydrotherapy Plan  Debridement;Dressing change;Patient/family education;Pulsatile lavage with suction    Wound Therapy - Frequency  3X / week   6 weeks    Wound Therapy - Current Recommendations  PT    Wound Plan  Follow up on ABI being completed tomororw and positional device for Rt LE.   Remeasure wounds each week.      Dressing   medihoney to lateral Rt LE and plantar LT LE wound; xeroform to all other wounds followed by profore lite compression bandage system.                PT Short Term Goals - 04/29/18 1300      PT SHORT TERM GOAL #1   Title  PT drainage to decrease to minimal on Rt ankle and LT plantar wounds to decrease cellulitis B     Time  1    Period  Weeks    Status  On-going      PT SHORT TERM GOAL #2   Title  PT to be wearing his off loading shoe     Time  1    Period  Weeks    Status  On-going      PT SHORT  TERM GOAL #3   Title  Callous to be debrided from LT plantar foot wound to improve wound healing enviornment     Time  3    Period  Weeks    Status  On-going      PT SHORT TERM GOAL #4   Title  Anterior leg wounds to be healed     Time  3    Period  Weeks    Status  On-going        PT Long Term Goals - 04/29/18 1301      PT LONG TERM GOAL #1   Title  PT RT ankle wound and Lt plantar wound to be 100% granulated to prevent infection     Time  3    Period  Weeks    Status  On-going      PT LONG TERM GOAL #2   Title  PT depth of LT plantar wound to be no greater than 1.0 to demonstrate wound healing.     Time  6    Period  Weeks    Status  On-going      PT LONG TERM GOAL #3   Title  PT wound size on both the above wounds to have decreased by at least 1.5 cm diameter to demonstrate improved wound healing.     Time  6    Period  Weeks    Status  On-going      PT LONG TERM GOAL #4   Title  PT to have a compression garment and be using on a regular basis to keep LE from increasing edema which will delay wound healing.    Time  6    Period  Weeks    Status  On-going              Patient will benefit from skilled therapeutic intervention in order to improve the following deficits and impairments:     Visit Diagnosis: Open wound of right ankle with complication, subsequent encounter  Other abnormalities of gait and mobility  Open wound of plantar aspect of foot, left, sequela     Problem List There are no active problems to display for this patient.  Teena Irani, PTA/CLT [phone]  Teena Irani 05/06/2018, 6:29 PM  [facility] [address] [place], [place], [address] Phone: [phone]   Fax:  [phone]  Name: Zalman Hull MRN: [id number] Date of Birth: [birth year]

## 2018-05-07 ENCOUNTER — Ambulatory Visit (HOSPITAL_COMMUNITY)
Admission: RE | Admit: 2018-05-07 | Discharge: 2018-05-07 | Disposition: A | Discharge status: Home / Self Care | Payer: Medicare HMO | Source: Ambulatory Visit | Attending: Adult Health Nurse Practitioner | Admitting: Adult Health Nurse Practitioner

## 2018-05-08 ENCOUNTER — Other Ambulatory Visit (HOSPITAL_COMMUNITY): Payer: Self-pay | Admitting: Adult Health Nurse Practitioner

## 2018-05-08 ENCOUNTER — Ambulatory Visit (HOSPITAL_COMMUNITY): Payer: Medicare HMO | Admitting: Physical Therapy

## 2018-05-08 DIAGNOSIS — S91001D Unspecified open wound, right ankle, subsequent encounter: Secondary | ICD-10-CM | POA: Diagnosis not present

## 2018-05-08 DIAGNOSIS — S91302S Unspecified open wound, left foot, sequela: Secondary | ICD-10-CM

## 2018-05-08 DIAGNOSIS — N184 Chronic kidney disease, stage 4 (severe): Secondary | ICD-10-CM | POA: Diagnosis not present

## 2018-05-08 DIAGNOSIS — E782 Mixed hyperlipidemia: Secondary | ICD-10-CM | POA: Diagnosis not present

## 2018-05-08 DIAGNOSIS — I129 Hypertensive chronic kidney disease with stage 1 through stage 4 chronic kidney disease, or unspecified chronic kidney disease: Secondary | ICD-10-CM | POA: Diagnosis not present

## 2018-05-08 DIAGNOSIS — L409 Psoriasis, unspecified: Secondary | ICD-10-CM | POA: Diagnosis not present

## 2018-05-08 DIAGNOSIS — E1122 Type 2 diabetes mellitus with diabetic chronic kidney disease: Secondary | ICD-10-CM | POA: Diagnosis not present

## 2018-05-08 DIAGNOSIS — L89613 Pressure ulcer of right heel, stage 3: Secondary | ICD-10-CM | POA: Diagnosis not present

## 2018-05-08 DIAGNOSIS — D631 Anemia in chronic kidney disease: Secondary | ICD-10-CM | POA: Diagnosis not present

## 2018-05-08 DIAGNOSIS — R2689 Other abnormalities of gait and mobility: Secondary | ICD-10-CM | POA: Diagnosis not present

## 2018-05-08 DIAGNOSIS — E1165 Type 2 diabetes mellitus with hyperglycemia: Secondary | ICD-10-CM | POA: Diagnosis not present

## 2018-05-08 DIAGNOSIS — L89893 Pressure ulcer of other site, stage 3: Secondary | ICD-10-CM

## 2018-05-08 NOTE — Therapy (Signed)
[place], [place], [address] Phone: [phone]   Fax:  [phone]  Wound Care Therapy  Patient Details  Name: Danny [name] MRN: [id number] Date of Birth: [birth year] Referring Provider (PT): Allyn Kenner   Encounter Date: 05/08/2018  PT End of Session - 05/08/18 1618    Visit Number  6    Number of Visits  18    Date for PT Re-Evaluation  06/07/18    Authorization Type  [employer] medicare    PT Start Time  1436    PT Stop Time  1546    PT Time Calculation (min)  70 min    Activity Tolerance  Patient tolerated treatment well    Behavior During Therapy  [facility] for tasks assessed/performed       Past Medical History:  Diagnosis Date  . Arthritis   . Diabetes mellitus without complication ([place])   . Foot ulcer ([place])   . Hypertension     Past Surgical History:  Procedure Laterality Date  . ANKLE SURGERY    . CHOLECYSTECTOMY    . FOOT SURGERY      There were no vitals filed for this visit.              Wound Therapy - 05/08/18 1612    Subjective  Pt states he had to cancel his ABI because it was raining and he would get his bandages wet.      Patient and Family Stated Goals  Wounds to finally heal     Date of Onset  04/26/16   approximate    Prior Treatments  wound center for 11 months.     Evaluation and Treatment Procedures Explained to Patient/Family  Yes    Evaluation and Treatment Procedures  agreed to    Wound Properties Date First Assessed: 04/26/18 Time First Assessed: 0917 Wound Type: Diabetic ulcer Location: Foot Wound Description (Comments): plantar aspect  Present on Admission: Yes   Dressing Type  Gauze (Comment)    Dressing Changed  Changed    Dressing Status  Old drainage    Dressing Change Frequency  PRN    Site / Wound Assessment  Red    % Wound base Red or Granulating  95%    % Wound base Yellow/Fibrinous Exudate  5%    Peri-wound Assessment  Edema;Other (Comment)   calloused     Margins  Epibole (rolled edges)    Drainage Amount  Moderate    Drainage Description  Serous    Treatment  Cleansed;Debridement (Selective)    Wound Properties Date First Assessed: 04/26/18 Time First Assessed: 0911 Wound Type: Diabetic ulcer Location: Leg Location Orientation: Left;Anterior Wound Description (Comments): suprior aspect    Dressing Type  --    Dressing Status  --    Dressing Change Frequency  --    Site / Wound Assessment  --    % Wound base Red or Granulating  --    % Wound base Yellow/Fibrinous Exudate  --    Peri-wound Assessment  --    Drainage Amount  --    Wound Properties Date First Assessed: 04/26/18 Time First Assessed: 3734 Wound Type: Other (Comment) Location: Ankle Location Orientation: Right;Lateral Present on Admission: Yes   Dressing Type  Gauze (Comment)    Dressing Changed  Changed    Dressing Change Frequency  PRN    % Wound base Red or Granulating  90%    % Wound base Yellow/Fibrinous Exudate  10%    Peri-wound Assessment  Erythema (blanchable)    Margins  Epibole (rolled edges)    Drainage Amount  Minimal    Drainage Description  Serous    Treatment  Cleansed;Debridement (Selective)    Selective Debridement - Location  Rt ankle and Lt forefoot    Selective Debridement - Tools Used  Forceps;Scissors    Selective Debridement - Tissue Removed  biofilm from Rt ankle wound, callous, slough, devitalized tissue     Wound Therapy - Clinical Statement  Lt anterior wound now healed with only Rt ankle and Lt forefoot wounds remaining.  Increased drainage Lt foot so changed dressing to silveralginate.  Lt ankle would remains somewhat dry, changed to xeroform for this one.  Cleansed well and debrided away callous and tissue perimeter to promote approximation.  No signs/symptoms of infrection.  Urged pateint to find his boot for Rt foot or either use valet services, not to cancel again if raining.   To get ABI completed tomorrow.      Wound Therapy - Functional  Problem List  Difficult to ambulate; pt is wallking on the side of his left foot.     Factors Delaying/Impairing Wound Healing  Altered sensation;Diabetes Mellitus;Infection - systemic/local;Multiple medical problems;Polypharmacy    Hydrotherapy Plan  Debridement;Dressing change;Patient/family education;Pulsatile lavage with suction    Wound Therapy - Frequency  3X / week   6 weeks    Wound Therapy - Current Recommendations  PT    Wound Plan  Follow up on ABI being completed tomororw and positional device for Rt LE.   Remeasure wounds each week.      Dressing   xeroform to lateral Rt LE and silver hydrofiber plantar LT LE wound; xeroform to all other wounds followed by profore lite compression bandage system.                PT Short Term Goals - 04/29/18 1300      PT SHORT TERM GOAL #1   Title  PT drainage to decrease to minimal on Rt ankle and LT plantar wounds to decrease cellulitis B     Time  1    Period  Weeks    Status  On-going      PT SHORT TERM GOAL #2   Title  PT to be wearing his off loading shoe     Time  1    Period  Weeks    Status  On-going      PT SHORT TERM GOAL #3   Title  Callous to be debrided from LT plantar foot wound to improve wound healing enviornment     Time  3    Period  Weeks    Status  On-going      PT SHORT TERM GOAL #4   Title  Anterior leg wounds to be healed     Time  3    Period  Weeks    Status  On-going        PT Long Term Goals - 04/29/18 1301      PT LONG TERM GOAL #1   Title  PT RT ankle wound and Lt plantar wound to be 100% granulated to prevent infection     Time  3    Period  Weeks    Status  On-going      PT LONG TERM GOAL #2   Title  PT depth of LT plantar wound to be no greater than 1.0 to demonstrate wound healing.  Time  6    Period  Weeks    Status  On-going      PT LONG TERM GOAL #3   Title  PT wound size on both the above wounds to have decreased by at least 1.5 cm diameter to demonstrate improved  wound healing.     Time  6    Period  Weeks    Status  On-going      PT LONG TERM GOAL #4   Title  PT to have a compression garment and be using on a regular basis to keep LE from increasing edema which will delay wound healing.    Time  6    Period  Weeks    Status  On-going              Patient will benefit from skilled therapeutic intervention in order to improve the following deficits and impairments:     Visit Diagnosis: Open wound of right ankle with complication, subsequent encounter  Other abnormalities of gait and mobility  Open wound of plantar aspect of foot, left, sequela     Problem List There are no active problems to display for this patient.  Teena Irani, PTA/CLT [phone]  Teena Irani 05/08/2018, 4:19 PM  [facility] [address] [place], [place], [address] Phone: [phone]   Fax:  [phone]  Name: Danny [name] MRN: [id number] Date of Birth: 28-Sep-1955

## 2018-05-09 ENCOUNTER — Ambulatory Visit (HOSPITAL_COMMUNITY)
Admission: RE | Admit: 2018-05-09 | Discharge: 2018-05-09 | Disposition: A | Discharge status: Home / Self Care | Payer: Medicare HMO | Source: Ambulatory Visit | Attending: Adult Health Nurse Practitioner | Admitting: Adult Health Nurse Practitioner

## 2018-05-09 DIAGNOSIS — S91302A Unspecified open wound, left foot, initial encounter: Secondary | ICD-10-CM | POA: Diagnosis not present

## 2018-05-09 DIAGNOSIS — L89893 Pressure ulcer of other site, stage 3: Secondary | ICD-10-CM | POA: Diagnosis not present

## 2018-05-09 DIAGNOSIS — S91301A Unspecified open wound, right foot, initial encounter: Secondary | ICD-10-CM | POA: Diagnosis not present

## 2018-05-10 ENCOUNTER — Ambulatory Visit (HOSPITAL_COMMUNITY): Payer: Medicare HMO

## 2018-05-10 ENCOUNTER — Encounter (HOSPITAL_COMMUNITY): Payer: Self-pay

## 2018-05-10 DIAGNOSIS — S91302S Unspecified open wound, left foot, sequela: Secondary | ICD-10-CM | POA: Diagnosis not present

## 2018-05-10 DIAGNOSIS — S91001D Unspecified open wound, right ankle, subsequent encounter: Secondary | ICD-10-CM | POA: Diagnosis not present

## 2018-05-10 DIAGNOSIS — R2689 Other abnormalities of gait and mobility: Secondary | ICD-10-CM

## 2018-05-10 NOTE — Therapy (Signed)
[place], [place], [address] Phone: [phone]   Fax:  [phone]  Wound Care Therapy  Patient Details  Name: Danny [name] MRN: [id number] Date of Birth: [birth year] Referring Provider (PT): Allyn Kenner   Encounter Date: 05/10/2018  PT End of Session - 05/10/18 1712    Visit Number  7    Number of Visits  18    Date for PT Re-Evaluation  06/07/18    Authorization Type  [employer] medicare    Authorization Time Period  11/01-->06/13/18    PT Start Time  1406    PT Stop Time  1510    PT Time Calculation (min)  64 min    Activity Tolerance  Patient tolerated treatment well    Behavior During Therapy  [facility] for tasks assessed/performed       Past Medical History:  Diagnosis Date  . Arthritis   . Diabetes mellitus without complication ([place])   . Foot ulcer ([place])   . Hypertension     Past Surgical History:  Procedure Laterality Date  . ANKLE SURGERY    . CHOLECYSTECTOMY    . FOOT SURGERY      There were no vitals filed for this visit.   Subjective Assessment - 05/10/18 1513    Subjective  Pt stated he completed ABI, arrived with dressings intact.  No reports of pain today.                Wound Therapy - 05/10/18 1607    Subjective  Pt stated he completed ABI, arrived with dressings intact.  No reports of pain today.    Patient and Family Stated Goals  Wounds to finally heal     Date of Onset  04/26/16   approximate   Prior Treatments  wound center for 11 months.     Evaluation and Treatment Procedures Explained to Patient/Family  Yes    Evaluation and Treatment Procedures  agreed to    Wound Properties Date First Assessed: 04/26/18 Time First Assessed: 0917 Wound Type: Diabetic ulcer Location: Foot Wound Description (Comments): plantar aspect  Present on Admission: Yes   Dressing Type  Silver hydrofiber;Gauze (Comment);Compression wrap    Dressing Changed  Changed    Dressing Status  Old  drainage    Dressing Change Frequency  PRN    Site / Wound Assessment  Red    % Wound base Red or Granulating  95%    % Wound base Yellow/Fibrinous Exudate  5%    % Wound base Other/Granulation Tissue (Comment)  --   callous perimeter   Peri-wound Assessment  Edema;Other (Comment)   calloused   Wound Length (cm)  2.5 cm   was 2.5   Wound Width (cm)  3 cm   was 3   Wound Depth (cm)  2.5 cm   was 2.5   Wound Volume (cm^3)  18.75 cm^3    Wound Surface Area (cm^2)  7.5 cm^2    Margins  Epibole (rolled edges)    Drainage Amount  Moderate    Drainage Description  Serous    Treatment  Cleansed;Debridement (Selective)    Wound Properties Date First Assessed: 04/26/18 Time First Assessed: 0919 Wound Type: Diabetic ulcer Location: Leg Location Orientation: Right;Anterior Present on Admission: Yes Final Assessment Date: 05/08/18   Dressing Type  --    Dressing Changed  --    Dressing Status  --    Wound Properties Date First Assessed: 04/26/18 Time  First Assessed: [phone] Wound Type: Other (Comment) Location: Ankle Location Orientation: Right;Lateral Present on Admission: Yes   Dressing Type  Impregnated gauze (bismuth);Gauze (Comment);Compression wrap    Dressing Changed  Changed    Dressing Change Frequency  PRN    % Wound base Red or Granulating  90%    % Wound base Yellow/Fibrinous Exudate  10%    Peri-wound Assessment  Erythema (blanchable)    Wound Length (cm)  2.5 cm   was 2.5   Wound Width (cm)  3.2 cm   was 3.5   Wound Depth (cm)  0.1 cm   was .2   Wound Volume (cm^3)  0.8 cm^3    Wound Surface Area (cm^2)  8 cm^2    Margins  Attached edges (approximated)    Drainage Amount  Minimal    Drainage Description  Serous    Treatment  Cleansed;Debridement (Selective)    Selective Debridement - Location  Rt ankle and Lt forefoot    Selective Debridement - Tools Used  Forceps;Scissors    Selective Debridement - Tissue Removed  biofilm from Rt ankle wound, callous, slough, devitalized  tissue     Wound Therapy - Clinical Statement  ABI complete.  Selective debridement and cleansing complete to plantar aspect of Lt foot and Rt lateral ankle.  Removal of callous to promote healing Lt and removal of biofilm on lateral Rt ankle.  Rt ankle continues to have dryness in wound bed, continued with xeroform to promote moisture to wound bed and silverhydrofiber to Lt plantar surface to address drainage.  BLE with Profore lite for edema control and additional toe wrap on Lt only to address edema in toes.  No reports of pain through session.     Wound Therapy - Functional Problem List  Difficult to ambulate; pt is wallking on the side of his left foot.     Factors Delaying/Impairing Wound Healing  Altered sensation;Diabetes Mellitus;Infection - systemic/local;Multiple medical problems;Polypharmacy    Hydrotherapy Plan  Debridement;Dressing change;Patient/family education;Pulsatile lavage with suction    Wound Therapy - Frequency  3X / week   6 weeks   Wound Therapy - Current Recommendations  PT    Wound Plan  Continue wiht current POC.  Measure weekly    Dressing   xeroform to lateral Rt LE and silver hydrofiber plantar LT LE wound; xeroform to all other wounds followed by profore lite compression bandage system.                PT Short Term Goals - 04/29/18 1300      PT SHORT TERM GOAL #1   Title  PT drainage to decrease to minimal on Rt ankle and LT plantar wounds to decrease cellulitis B     Time  1    Period  Weeks    Status  On-going      PT SHORT TERM GOAL #2   Title  PT to be wearing his off loading shoe     Time  1    Period  Weeks    Status  On-going      PT SHORT TERM GOAL #3   Title  Callous to be debrided from LT plantar foot wound to improve wound healing enviornment     Time  3    Period  Weeks    Status  On-going      PT SHORT TERM GOAL #4   Title  Anterior leg wounds to be healed     Time  3    Period  Weeks    Status  On-going        PT Long  Term Goals - 04/29/18 1301      PT LONG TERM GOAL #1   Title  PT RT ankle wound and Lt plantar wound to be 100% granulated to prevent infection     Time  3    Period  Weeks    Status  On-going      PT LONG TERM GOAL #2   Title  PT depth of LT plantar wound to be no greater than 1.0 to demonstrate wound healing.     Time  6    Period  Weeks    Status  On-going      PT LONG TERM GOAL #3   Title  PT wound size on both the above wounds to have decreased by at least 1.5 cm diameter to demonstrate improved wound healing.     Time  6    Period  Weeks    Status  On-going      PT LONG TERM GOAL #4   Title  PT to have a compression garment and be using on a regular basis to keep LE from increasing edema which will delay wound healing.    Time  6    Period  Weeks    Status  On-going              Patient will benefit from skilled therapeutic intervention in order to improve the following deficits and impairments:     Visit Diagnosis: Open wound of right ankle with complication, subsequent encounter  Other abnormalities of gait and mobility  Open wound of plantar aspect of foot, left, sequela     Problem List There are no active problems to display for this patient.  [address] LPTA; Leon  Aldona Lento 05/10/2018, 5:14 PM  [place] [facility] [address] [place], [place], [address] Phone: [phone]   Fax:  [phone]  Name: Derelle Cockrell MRN: [id number] Date of Birth: January 31, 1956

## 2018-05-13 ENCOUNTER — Ambulatory Visit (HOSPITAL_COMMUNITY): Payer: Medicare HMO

## 2018-05-13 DIAGNOSIS — S91302S Unspecified open wound, left foot, sequela: Secondary | ICD-10-CM | POA: Diagnosis not present

## 2018-05-13 DIAGNOSIS — S91001D Unspecified open wound, right ankle, subsequent encounter: Secondary | ICD-10-CM | POA: Diagnosis not present

## 2018-05-13 DIAGNOSIS — R2689 Other abnormalities of gait and mobility: Secondary | ICD-10-CM

## 2018-05-14 NOTE — Therapy (Signed)
[place], [place], [address] Phone: [phone]   Fax:  [phone]  Wound Care Therapy  Patient Details  Name: Danny [name] MRN: [id number] Date of Birth: 12-Dec-1955 Referring Provider (PT): Allyn Kenner   Encounter Date: 05/13/2018  PT End of Session - 05/13/18 1607    Visit Number  8    Number of Visits  18    Date for PT Re-Evaluation  06/07/18    Authorization Type  [employer] medicare    Authorization Time Period  11/01-->06/13/18    PT Start Time  1650    PT Stop Time  1804    PT Time Calculation (min)  74 min    Activity Tolerance  Patient tolerated treatment well    Behavior During Therapy  [facility] for tasks assessed/performed       Past Medical History:  Diagnosis Date  . Arthritis   . Diabetes mellitus without complication ([place])   . Foot ulcer ([place])   . Hypertension     Past Surgical History:  Procedure Laterality Date  . ANKLE SURGERY    . CHOLECYSTECTOMY    . FOOT SURGERY      There were no vitals filed for this visit.    Wound Therapy - 05/13/18 1559    Subjective  Patient denies pain on arrival and arrives with a plastic bag on his foot to keep his dressings dry since it is raining outside.     Patient and Family Stated Goals  Wounds to finally heal     Date of Onset  04/26/16   approximate   Prior Treatments  wound center for 11 months.     Evaluation and Treatment Procedures Explained to Patient/Family  Yes    Evaluation and Treatment Procedures  agreed to    Wound Properties Date First Assessed: 04/26/18 Time First Assessed: 0917 Wound Type: Diabetic ulcer Location: Foot Wound Description (Comments): plantar aspect  Present on Admission: Yes   Dressing Type  Silver hydrofiber;Gauze (Comment);Compression wrap    Dressing Changed  Changed    Dressing Status  Old drainage    Dressing Change Frequency  PRN    Site / Wound Assessment  Red    % Wound base Red or Granulating  95%    % Wound  base Yellow/Fibrinous Exudate  5%    % Wound base Other/Granulation Tissue (Comment)  --   callous perimeter   Peri-wound Assessment  Edema;Other (Comment)   calloused   Margins  Epibole (rolled edges)    Drainage Amount  Moderate    Drainage Description  Serous    Treatment  Cleansed;Debridement (Selective)    Wound Properties Date First Assessed: 04/26/18 Time First Assessed: 0998 Wound Type: Other (Comment) Location: Ankle Location Orientation: Right;Lateral Present on Admission: Yes   Dressing Type  Impregnated gauze (bismuth);Gauze (Comment);Compression wrap    Dressing Changed  Changed    Dressing Change Frequency  PRN    % Wound base Red or Granulating  90%    % Wound base Yellow/Fibrinous Exudate  10%    Peri-wound Assessment  Erythema (blanchable)    Margins  Attached edges (approximated)    Drainage Amount  Minimal    Drainage Description  Serous    Treatment  Cleansed;Debridement (Selective)    Selective Debridement - Location  Rt ankle and Lt forefoot    Selective Debridement - Tools Used  Forceps;Scissors;Scalpel    Selective Debridement - Tissue Removed  biofilm from Rt  ankle wound, callous, slough, devitalized tissue     Wound Therapy - Clinical Statement  Patient arrives with dressing's intact. Wounds are granulating and slough is easily removed from wounds beds. Lt plantar wound has substantial callous surrounding margins which are epiboled requiring debridement to promote healing. Rt ankle has increased drainage but no maceration noted around periwound. Continued with xeroform to Rt ankle wound and silver hydrofiber for Lt plantar wound followed by profore lite wrap. He will continue to benefit from skilled wound care to reduce infection and promote healing of wounds.     Wound Therapy - Functional Problem List  Difficult to ambulate; pt is wallking on the side of his left foot.     Factors Delaying/Impairing Wound Healing  Altered sensation;Diabetes Mellitus;Infection -  systemic/local;Multiple medical problems;Polypharmacy    Hydrotherapy Plan  Debridement;Dressing change;Patient/family education;Pulsatile lavage with suction    Wound Therapy - Frequency  3X / week   6 weeks   Wound Therapy - Current Recommendations  PT    Wound Plan  Continue with current POC.  Measure weekly    Dressing   xeroform to Rt lateral ankle wound, and silver hydrofiber Lt plantar wound; followed by profore lite compression bandage system.        PT Short Term Goals - 04/29/18 1300      PT SHORT TERM GOAL #1   Title  PT drainage to decrease to minimal on Rt ankle and LT plantar wounds to decrease cellulitis B     Time  1    Period  Weeks    Status  On-going      PT SHORT TERM GOAL #2   Title  PT to be wearing his off loading shoe     Time  1    Period  Weeks    Status  On-going      PT SHORT TERM GOAL #3   Title  Callous to be debrided from LT plantar foot wound to improve wound healing enviornment     Time  3    Period  Weeks    Status  On-going      PT SHORT TERM GOAL #4   Title  Anterior leg wounds to be healed     Time  3    Period  Weeks    Status  On-going        PT Long Term Goals - 04/29/18 1301      PT LONG TERM GOAL #1   Title  PT RT ankle wound and Lt plantar wound to be 100% granulated to prevent infection     Time  3    Period  Weeks    Status  On-going      PT LONG TERM GOAL #2   Title  PT depth of LT plantar wound to be no greater than 1.0 to demonstrate wound healing.     Time  6    Period  Weeks    Status  On-going      PT LONG TERM GOAL #3   Title  PT wound size on both the above wounds to have decreased by at least 1.5 cm diameter to demonstrate improved wound healing.     Time  6    Period  Weeks    Status  On-going      PT LONG TERM GOAL #4   Title  PT to have a compression garment and be using on a regular basis to keep LE from increasing edema  which will delay wound healing.    Time  6    Period  Weeks    Status   On-going        Plan - 05/13/18 1608    Clinical Impression Statement  see above    Rehab Potential  Fair    PT Frequency  3x / week    PT Duration  6 weeks    PT Treatment/Interventions  Other (comment);ADLs/Self Care Home Management;Patient/family education   debridement and dressing change.    PT Next Visit Plan  continue with debridement and dressing; follow up on ABI request.    Consulted and Agree with Plan of Care  Patient       Patient will benefit from skilled therapeutic intervention in order to improve the following deficits and impairments:  Abnormal gait, Decreased activity tolerance, Increased edema, Decreased skin integrity  Visit Diagnosis: Open wound of right ankle with complication, subsequent encounter  Other abnormalities of gait and mobility  Open wound of plantar aspect of foot, left, sequela     Problem List There are no active problems to display for this patient.   Kipp Brood, PT, DPT Physical Therapist with [facility]  05/14/2018 4:09 PM    [place], [place], [address] Phone: [phone]   Fax:  [phone]  Name: Danny [name] MRN: [id number] Date of Birth: [birth year]

## 2018-05-15 ENCOUNTER — Ambulatory Visit (HOSPITAL_COMMUNITY): Payer: Medicare HMO | Admitting: Physical Therapy

## 2018-05-15 DIAGNOSIS — R2689 Other abnormalities of gait and mobility: Secondary | ICD-10-CM

## 2018-05-15 DIAGNOSIS — S91001D Unspecified open wound, right ankle, subsequent encounter: Secondary | ICD-10-CM | POA: Diagnosis not present

## 2018-05-15 DIAGNOSIS — S91302S Unspecified open wound, left foot, sequela: Secondary | ICD-10-CM

## 2018-05-15 NOTE — Therapy (Signed)
[place], [place], [address] Phone: [phone]   Fax:  [phone]  Wound Care Therapy  Patient Details  Name: Danny [name] MRN: [id number] Date of Birth: 12-26-1955 Referring Provider (PT): Allyn Kenner   Encounter Date: 05/15/2018  PT End of Session - 05/15/18 1138    Visit Number  9    Number of Visits  18    Date for PT Re-Evaluation  06/07/18    Authorization Type  [employer]are    Authorization Time Period  11/01-->06/13/18    PT Start Time  0955    PT Stop Time  1055    PT Time Calculation (min)  60 min    Activity Tolerance  Patient tolerated treatment well    Behavior During Therapy  [facility] for tasks assessed/performed       Past Medical History:  Diagnosis Date  . Arthritis   . Diabetes mellitus without complication ([place])   . Foot ulcer ([place])   . Hypertension     Past Surgical History:  Procedure Laterality Date  . ANKLE SURGERY    . CHOLECYSTECTOMY    . FOOT SURGERY      There were no vitals filed for this visit.              Wound Therapy - 05/15/18 1130    Subjective  Pt reports he is doing well today.    Patient and Family Stated Goals  Wounds to finally heal     Date of Onset  04/26/16   approximate   Prior Treatments  wound center for 11 months.     Evaluation and Treatment Procedures Explained to Patient/Family  Yes    Evaluation and Treatment Procedures  agreed to    Wound Properties Date First Assessed: 04/26/18 Time First Assessed: 0917 Wound Type: Diabetic ulcer Location: Foot Wound Description (Comments): plantar aspect  Present on Admission: Yes   Dressing Type  Silver hydrofiber;Gauze (Comment);Compression wrap    Dressing Changed  Changed    Dressing Status  Old drainage    Dressing Change Frequency  PRN    Site / Wound Assessment  Red    % Wound base Red or Granulating  95%    % Wound base Yellow/Fibrinous Exudate  5%    % Wound base Other/Granulation  Tissue (Comment)  --   callous perimeter   Peri-wound Assessment  Edema;Other (Comment)   calloused   Wound Length (cm)  2 cm   was 2.5 cm on 11/15   Wound Width (cm)  2.5 cm   was 3cm on 11/15   Wound Depth (cm)  1.8 cm   was 2.5cm on 11/15   Wound Volume (cm^3)  9 cm^3    Wound Surface Area (cm^2)  5 cm^2    Margins  Epibole (rolled edges)    Drainage Amount  Minimal    Drainage Description  Serous    Treatment  Cleansed;Debridement (Selective)    Wound Properties Date First Assessed: 04/26/18 Time First Assessed: 6144 Wound Type: Other (Comment) Location: Ankle Location Orientation: Right;Lateral Present on Admission: Yes   Dressing Type  Impregnated gauze (bismuth);Gauze (Comment);Compression wrap    Dressing Changed  Changed    Dressing Change Frequency  PRN    % Wound base Red or Granulating  90%    % Wound base Yellow/Fibrinous Exudate  10%    Peri-wound Assessment  Erythema (blanchable)    Wound Length (cm)  2 cm  was 2.5cm on 11/15   Wound Width (cm)  3 cm   was 3.2 cm on 11/15   Wound Depth (cm)  0.1 cm   was 0.1 cm on 11/15   Wound Volume (cm^3)  0.6 cm^3    Wound Surface Area (cm^2)  6 cm^2    Margins  Attached edges (approximated)    Drainage Amount  Minimal    Drainage Description  Serous    Selective Debridement - Location  Rt ankle and Lt forefoot    Selective Debridement - Tools Used  Forceps;Scissors;Scalpel    Selective Debridement - Tissue Removed  biofilm from Rt ankle wound, callous, slough, devitalized tissue     Wound Therapy - Clinical Statement  wounds remeasured today with noted reduction, improved approximation and granulation.  Current dressings remain appropriate for adequate healing environment.  Increased to profore today as ABI were WNL.  Pt reported overall comfort with dressings.     Wound Therapy - Functional Problem List  Difficult to ambulate; pt is wallking on the side of his left foot.     Factors Delaying/Impairing Wound Healing   Altered sensation;Diabetes Mellitus;Infection - systemic/local;Multiple medical problems;Polypharmacy    Hydrotherapy Plan  Debridement;Dressing change;Patient/family education;Pulsatile lavage with suction    Wound Therapy - Frequency  3X / week   6 weeks   Wound Therapy - Current Recommendations  PT    Wound Plan  Continue with current POC.  Measure weekly    Dressing   xeroform to Rt lateral ankle wound, and silver hydrofiber Lt plantar wound; followed by profore compression bandage system.                PT Short Term Goals - 04/29/18 1300      PT SHORT TERM GOAL #1   Title  PT drainage to decrease to minimal on Rt ankle and LT plantar wounds to decrease cellulitis B     Time  1    Period  Weeks    Status  On-going      PT SHORT TERM GOAL #2   Title  PT to be wearing his off loading shoe     Time  1    Period  Weeks    Status  On-going      PT SHORT TERM GOAL #3   Title  Callous to be debrided from LT plantar foot wound to improve wound healing enviornment     Time  3    Period  Weeks    Status  On-going      PT SHORT TERM GOAL #4   Title  Anterior leg wounds to be healed     Time  3    Period  Weeks    Status  On-going        PT Long Term Goals - 04/29/18 1301      PT LONG TERM GOAL #1   Title  PT RT ankle wound and Lt plantar wound to be 100% granulated to prevent infection     Time  3    Period  Weeks    Status  On-going      PT LONG TERM GOAL #2   Title  PT depth of LT plantar wound to be no greater than 1.0 to demonstrate wound healing.     Time  6    Period  Weeks    Status  On-going      PT LONG TERM GOAL #3   Title  PT wound  size on both the above wounds to have decreased by at least 1.5 cm diameter to demonstrate improved wound healing.     Time  6    Period  Weeks    Status  On-going      PT LONG TERM GOAL #4   Title  PT to have a compression garment and be using on a regular basis to keep LE from increasing edema which will delay  wound healing.    Time  6    Period  Weeks    Status  On-going              Patient will benefit from skilled therapeutic intervention in order to improve the following deficits and impairments:     Visit Diagnosis: Open wound of right ankle with complication, subsequent encounter  Other abnormalities of gait and mobility  Open wound of plantar aspect of foot, left, sequela     Problem List There are no active problems to display for this patient.  Teena Irani, PTA/CLT [phone]   Teena Irani 05/15/2018, 11:42 AM  [place], [place], [address] Phone: [phone]   Fax:  [phone]  Name: Daevion Navarette MRN: [id number] Date of Birth: [birth year]

## 2018-05-17 ENCOUNTER — Ambulatory Visit (HOSPITAL_COMMUNITY): Payer: Medicare HMO

## 2018-05-17 ENCOUNTER — Encounter (HOSPITAL_COMMUNITY): Payer: Self-pay

## 2018-05-17 DIAGNOSIS — S91001D Unspecified open wound, right ankle, subsequent encounter: Secondary | ICD-10-CM | POA: Diagnosis not present

## 2018-05-17 DIAGNOSIS — S91302S Unspecified open wound, left foot, sequela: Secondary | ICD-10-CM

## 2018-05-17 DIAGNOSIS — R2689 Other abnormalities of gait and mobility: Secondary | ICD-10-CM

## 2018-05-17 NOTE — Therapy (Addendum)
[place], [place], [address] Phone: [phone]   Fax:  [phone]  Wound Care Therapy  Patient Details  Name: Danny [name] MRN: [id number] Date of Birth: 07-Jun-1956 Referring Provider (PT): Allyn Kenner Progress Note Reporting Period 04/26/18 to 05/17/18  See note below for Objective Data and Assessment of Progress/Goals.    Encounter Date: 05/17/2018  PT End of Session - 05/17/18 1616    Visit Number  10    Number of Visits  18    Date for PT Re-Evaluation  06/07/18    Authorization Type  [employer]    Authorization Time Period  11/01-->06/13/18    Authorization - Visit Number  10    Authorization - Number of Visits  20    PT Start Time  2536    PT Stop Time  1507    PT Time Calculation (min)  74 min    Activity Tolerance  Patient tolerated treatment well    Behavior During Therapy  [facility] for tasks assessed/performed       Past Medical History:  Diagnosis Date  . Arthritis   . Diabetes mellitus without complication ([place])   . Foot ulcer ([place])   . Hypertension     Past Surgical History:  Procedure Laterality Date  . ANKLE SURGERY    . CHOLECYSTECTOMY    . FOOT SURGERY      There were no vitals filed for this visit.              Wound Therapy - 05/17/18 1616    Subjective  Pt reports he is doing well today.    Patient and Family Stated Goals  Wounds to finally heal     Date of Onset  04/26/16   approximate   Prior Treatments  wound center for 11 months.     Evaluation and Treatment Procedures Explained to Patient/Family  Yes    Evaluation and Treatment Procedures  agreed to    Wound Properties Date First Assessed: 04/26/18 Time First Assessed: 0917 Wound Type: Diabetic ulcer Location: Foot Wound Description (Comments): plantar aspect  Present on Admission: Yes   Dressing Type  Silver hydrofiber;Gauze (Comment);Compression wrap    Dressing Changed  Changed    Dressing Status  Old  drainage    Dressing Change Frequency  PRN    Site / Wound Assessment  Red    % Wound base Red or Granulating  95%    % Wound base Yellow/Fibrinous Exudate  5%    % Wound base Other/Granulation Tissue (Comment)  --   callous perimeter   Peri-wound Assessment  Edema;Other (Comment)   calloused   Wound Length (cm)  2 cm   was 2   Wound Width (cm)  2.4 cm   was 2.5   Wound Depth (cm)  1.6 cm   was 1.8   Wound Volume (cm^3)  7.68 cm^3    Wound Surface Area (cm^2)  4.8 cm^2    Margins  Epibole (rolled edges)    Drainage Amount  Minimal    Drainage Description  Serous    Treatment  Cleansed;Debridement (Selective)    Wound Properties Date First Assessed: 04/26/18 Time First Assessed: 6440 Wound Type: Other (Comment) Location: Ankle Location Orientation: Right;Lateral Present on Admission: Yes   Dressing Type  Impregnated gauze (bismuth);Gauze (Comment);Compression wrap    Dressing Changed  Changed    Dressing Status  Clean;Dry;Intact    Dressing Change Frequency  PRN  Site / Wound Assessment  Red    % Wound base Red or Granulating  95%    % Wound base Yellow/Fibrinous Exudate  5%    Peri-wound Assessment  Erythema (blanchable)    Wound Length (cm)  2 cm   was 2   Wound Width (cm)  2.8 cm   was 3   Wound Depth (cm)  0.1 cm    Wound Volume (cm^3)  0.56 cm^3    Wound Surface Area (cm^2)  5.6 cm^2    Margins  Attached edges (approximated)    Drainage Amount  Minimal    Drainage Description  Serous    Treatment  Cleansed;Debridement (Selective)    Selective Debridement - Location  Rt ankle and Lt forefoot    Selective Debridement - Tools Used  Forceps;Scalpel    Selective Debridement - Tissue Removed  biofilm from Rt ankle wound, callous, slough, devitalized tissue     Wound Therapy - Clinical Statement  Wounds progressing with decreased in width for lateral ankle.  Selective debridement for removal of callouses to promote healing on Lt forefoot and removal of biofilm on lateral  ankle.  Continued wiht the same dressings for address drainage and compression wrap to address edema with toe wrap on Lt foot.      Wound Therapy - Functional Problem List  Difficult to ambulate; pt is wallking on the side of his left foot.     Factors Delaying/Impairing Wound Healing  Altered sensation;Diabetes Mellitus;Infection - systemic/local;Multiple medical problems;Polypharmacy    Hydrotherapy Plan  Debridement;Dressing change;Patient/family education;Pulsatile lavage with suction    Wound Therapy - Frequency  3X / week    Wound Therapy - Current Recommendations  PT    Wound Plan  Next session assure pt wearing pressure relief boot for Lt LE.  Continue with current POC.  Measure weekly    Dressing   xeroform to Rt lateral ankle wound, and silver hydrofiber Lt plantar wound; followed by profore compression bandage system.                PT Short Term Goals - 04/29/18 1300      PT SHORT TERM GOAL #1   Title  PT drainage to decrease to minimal on Rt ankle and LT plantar wounds to decrease cellulitis B     Time  1    Period  Weeks    Status  On-going      PT SHORT TERM GOAL #2   Title  PT to be wearing his off loading shoe     Time  1    Period  Weeks    Status  On-going      PT SHORT TERM GOAL #3   Title  Callous to be debrided from LT plantar foot wound to improve wound healing enviornment     Time  3    Period  Weeks    Status  On-going      PT SHORT TERM GOAL #4   Title  Anterior leg wounds to be healed     Time  3    Period  Weeks    Status  On-going        PT Long Term Goals - 04/29/18 1301      PT LONG TERM GOAL #1   Title  PT RT ankle wound and Lt plantar wound to be 100% granulated to prevent infection     Time  3    Period  Weeks    Status  On-going      PT LONG TERM GOAL #2   Title  PT depth of LT plantar wound to be no greater than 1.0 to demonstrate wound healing.     Time  6    Period  Weeks    Status  On-going      PT LONG TERM GOAL #3    Title  PT wound size on both the above wounds to have decreased by at least 1.5 cm diameter to demonstrate improved wound healing.     Time  6    Period  Weeks    Status  On-going      PT LONG TERM GOAL #4   Title  PT to have a compression garment and be using on a regular basis to keep LE from increasing edema which will delay wound healing.    Time  6    Period  Weeks    Status  On-going              Patient will benefit from skilled therapeutic intervention in order to improve the following deficits and impairments:     Visit Diagnosis: Open wound of right ankle with complication, subsequent encounter  Open wound of plantar aspect of foot, left, sequela  Other abnormalities of gait and mobility     Problem List There are no active problems to display for this patient.  [address], LPTA; [place]  Rayetta Humphrey, PT CLT [phone] 05/17/2018, 4:29 PM  [place] [address] [place], [place], [address] Phone: [phone]   Fax:  [phone]  Name: Bricen Victory MRN: [id number] Date of Birth: 05-23-1956

## 2018-05-21 ENCOUNTER — Ambulatory Visit (HOSPITAL_COMMUNITY): Payer: Medicare HMO | Admitting: Physical Therapy

## 2018-05-21 DIAGNOSIS — S91001D Unspecified open wound, right ankle, subsequent encounter: Secondary | ICD-10-CM | POA: Diagnosis not present

## 2018-05-21 DIAGNOSIS — R2689 Other abnormalities of gait and mobility: Secondary | ICD-10-CM | POA: Diagnosis not present

## 2018-05-21 DIAGNOSIS — S91302S Unspecified open wound, left foot, sequela: Secondary | ICD-10-CM | POA: Diagnosis not present

## 2018-05-21 NOTE — Therapy (Signed)
[place], [place], [address] Phone: [phone]   Fax:  [phone]  Wound Care Therapy  Patient Details  Name: Danny [name] MRN: [id number] Date of Birth: 03-Feb-1956 Referring Provider (PT): Allyn Kenner   Encounter Date: 05/21/2018  PT End of Session - 05/21/18 1136    Visit Number  11    Number of Visits  18    Date for PT Re-Evaluation  06/07/18    Authorization Type  [employer] medicare    Authorization Time Period  11/01-->06/13/18    Authorization - Visit Number  11    Authorization - Number of Visits  20    PT Start Time  0950    PT Stop Time  1100    PT Time Calculation (min)  70 min    Activity Tolerance  Patient tolerated treatment well    Behavior During Therapy  [facility] for tasks assessed/performed       Past Medical History:  Diagnosis Date  . Arthritis   . Diabetes mellitus without complication ([place])   . Foot ulcer ([place])   . Hypertension     Past Surgical History:  Procedure Laterality Date  . ANKLE SURGERY    . CHOLECYSTECTOMY    . FOOT SURGERY      There were no vitals filed for this visit.              Wound Therapy - 05/21/18 1118    Subjective  Pt reports he is doing well today.    Patient and Family Stated Goals  Wounds to finally heal     Date of Onset  04/26/16   approximate   Prior Treatments  wound center for 11 months.     Evaluation and Treatment Procedures Explained to Patient/Family  Yes    Evaluation and Treatment Procedures  agreed to    Wound Properties Date First Assessed: 04/26/18 Time First Assessed: 0917 Wound Type: Diabetic ulcer Location: Foot Wound Description (Comments): plantar aspect  Present on Admission: Yes   Dressing Type  Silver hydrofiber;Gauze (Comment);Compression wrap    Dressing Status  Old drainage    Dressing Change Frequency  PRN    Site / Wound Assessment  Red    % Wound base Red or Granulating  95%    % Wound base Yellow/Fibrinous  Exudate  5%    % Wound base Other/Granulation Tissue (Comment)  --   callous perimeter   Peri-wound Assessment  Edema;Other (Comment)   calloused   Wound Length (cm)  2 cm    Wound Width (cm)  2.3 cm    Wound Depth (cm)  1.6 cm    Wound Volume (cm^3)  7.36 cm^3    Wound Surface Area (cm^2)  4.6 cm^2    Margins  Epibole (rolled edges)    Drainage Amount  Minimal    Drainage Description  Serous    Treatment  Cleansed;Debridement (Selective)    Wound Properties Date First Assessed: 04/26/18 Time First Assessed: 7169 Wound Type: Other (Comment) Location: Ankle Location Orientation: Right;Lateral Present on Admission: Yes   Dressing Type  Impregnated gauze (bismuth);Gauze (Comment);Compression wrap    Dressing Changed  Changed    Dressing Status  Clean;Dry;Intact    Dressing Change Frequency  PRN    Site / Wound Assessment  Red    % Wound base Red or Granulating  95%    % Wound base Yellow/Fibrinous Exudate  5%    Peri-wound Assessment  Erythema (blanchable)    Wound Length (cm)  1.8 cm    Wound Width (cm)  2.8 cm    Wound Depth (cm)  0 cm    Wound Volume (cm^3)  0 cm^3    Wound Surface Area (cm^2)  5.04 cm^2    Margins  Attached edges (approximated)    Drainage Amount  Minimal    Drainage Description  Serous    Treatment  Cleansed;Debridement (Selective)    Selective Debridement - Location  Rt ankle and Lt forefoot    Selective Debridement - Tools Used  Forceps;Scalpel    Selective Debridement - Tissue Removed  biofilm from Rt ankle wound, callous, slough, devitalized tissue     Wound Therapy - Clinical Statement  contiued progression with approximation of Lt ankle and decreasing depth of bilateral LE wounds.  Continues to respond well to current dressings.  Used a insole and removed pegs to decrease weight bearing through Lt plantar forefoot.  Pt reported overall comfort with insole in cast shoe.  LE's with decreasing erythemia, however some permanent hemosideran staining present.  No  signs/symptoms of infection.  Comfort reported with dressings.     Wound Therapy - Functional Problem List  Difficult to ambulate; pt is wallking on the side of his left foot.     Factors Delaying/Impairing Wound Healing  Altered sensation;Diabetes Mellitus;Infection - systemic/local;Multiple medical problems;Polypharmacy    Hydrotherapy Plan  Debridement;Dressing change;Patient/family education;Pulsatile lavage with suction    Wound Therapy - Frequency  3X / week    Wound Therapy - Current Recommendations  PT    Wound Plan  Continue with current POC.  Measure weekly    Dressing   xeroform to Rt lateral ankle wound, and silver hydrofiber Lt plantar wound; followed by profore compression bandage system.                PT Short Term Goals - 04/29/18 1300      PT SHORT TERM GOAL #1   Title  PT drainage to decrease to minimal on Rt ankle and LT plantar wounds to decrease cellulitis B     Time  1    Period  Weeks    Status  On-going      PT SHORT TERM GOAL #2   Title  PT to be wearing his off loading shoe     Time  1    Period  Weeks    Status  On-going      PT SHORT TERM GOAL #3   Title  Callous to be debrided from LT plantar foot wound to improve wound healing enviornment     Time  3    Period  Weeks    Status  On-going      PT SHORT TERM GOAL #4   Title  Anterior leg wounds to be healed     Time  3    Period  Weeks    Status  On-going        PT Long Term Goals - 04/29/18 1301      PT LONG TERM GOAL #1   Title  PT RT ankle wound and Lt plantar wound to be 100% granulated to prevent infection     Time  3    Period  Weeks    Status  On-going      PT LONG TERM GOAL #2   Title  PT depth of LT plantar wound to be no greater than 1.0 to demonstrate wound healing.  Time  6    Period  Weeks    Status  On-going      PT LONG TERM GOAL #3   Title  PT wound size on both the above wounds to have decreased by at least 1.5 cm diameter to demonstrate improved wound  healing.     Time  6    Period  Weeks    Status  On-going      PT LONG TERM GOAL #4   Title  PT to have a compression garment and be using on a regular basis to keep LE from increasing edema which will delay wound healing.    Time  6    Period  Weeks    Status  On-going              Patient will benefit from skilled therapeutic intervention in order to improve the following deficits and impairments:     Visit Diagnosis: Open wound of plantar aspect of foot, left, sequela  Open wound of right ankle with complication, subsequent encounter  Other abnormalities of gait and mobility     Problem List There are no active problems to display for this patient.   Teena Irani 05/21/2018, 11:38 AM  [place] [address] [place], [place], [address] Phone: [phone]   Fax:  [phone]  Name: Danny [name] MRN: [id number] Date of Birth: 1955-09-25

## 2018-05-27 ENCOUNTER — Ambulatory Visit (HOSPITAL_COMMUNITY): Payer: Medicare HMO | Attending: Internal Medicine | Admitting: Physical Therapy

## 2018-05-27 DIAGNOSIS — R2689 Other abnormalities of gait and mobility: Secondary | ICD-10-CM | POA: Diagnosis not present

## 2018-05-27 DIAGNOSIS — S91302S Unspecified open wound, left foot, sequela: Secondary | ICD-10-CM | POA: Diagnosis not present

## 2018-05-27 DIAGNOSIS — S91001D Unspecified open wound, right ankle, subsequent encounter: Secondary | ICD-10-CM | POA: Diagnosis not present

## 2018-05-27 NOTE — Therapy (Signed)
[place], [place], [address] Phone: [phone]   Fax:  [phone]  Wound Care Therapy  Patient Details  Name: Danny [name] MRN: [id number] Date of Birth: [birth year] Referring Provider (PT): Allyn Kenner   Encounter Date: 05/27/2018  PT End of Session - 05/27/18 1652    Visit Number  12    Number of Visits  18    Date for PT Re-Evaluation  06/07/18    Authorization Type  [employer]    Authorization Time Period  11/01-->06/13/18    Authorization - Visit Number  12    Authorization - Number of Visits  20    PT Start Time  2426    PT Stop Time  1626    PT Time Calculation (min)  70 min    Activity Tolerance  Patient tolerated treatment well    Behavior During Therapy  [facility] for tasks assessed/performed       Past Medical History:  Diagnosis Date  . Arthritis   . Diabetes mellitus without complication ([place])   . Foot ulcer ([place])   . Hypertension     Past Surgical History:  Procedure Laterality Date  . ANKLE SURGERY    . CHOLECYSTECTOMY    . FOOT SURGERY      There were no vitals filed for this visit.              Wound Therapy - 05/27/18 1645    Subjective  Pt reports no issues today with wound.  STates he was unable to use the insert given at last session due to hurting his back/instability in shoe.  States he plans to go to [place] and get some foam and he's going to try his other shoe he has.     Patient and Family Stated Goals  Wounds to finally heal     Date of Onset  04/26/16   approximate   Prior Treatments  wound center for 11 months.     Evaluation and Treatment Procedures Explained to Patient/Family  Yes    Evaluation and Treatment Procedures  agreed to    Wound Properties Date First Assessed: 04/26/18 Time First Assessed: 0917 Wound Type: Diabetic ulcer Location: Foot Wound Description (Comments): plantar aspect  Present on Admission: Yes   Dressing Type  Silver  hydrofiber;Gauze (Comment);Compression wrap    Dressing Changed  Changed    Dressing Status  Old drainage    Dressing Change Frequency  PRN    Site / Wound Assessment  Red    % Wound base Red or Granulating  100%    % Wound base Yellow/Fibrinous Exudate  0%    % Wound base Other/Granulation Tissue (Comment)  --   callous perimeter   Peri-wound Assessment  Edema;Other (Comment)   calloused   Wound Length (cm)  1.8 cm    Wound Width (cm)  2.4 cm    Wound Depth (cm)  0.5 cm    Wound Volume (cm^3)  2.16 cm^3    Wound Surface Area (cm^2)  4.32 cm^2    Margins  Epibole (rolled edges)    Drainage Amount  Minimal    Drainage Description  Serous    Treatment  Cleansed;Debridement (Selective)    Wound Properties Date First Assessed: 04/26/18 Time First Assessed: 8341 Wound Type: Other (Comment) Location: Ankle Location Orientation: Right;Lateral Present on Admission: Yes   Dressing Type  Silver hydrofiber;Compression wrap    Dressing Changed  Changed  Dressing Status  Clean;Dry;Intact    Dressing Change Frequency  PRN    Site / Wound Assessment  Red    % Wound base Red or Granulating  100%    % Wound base Yellow/Fibrinous Exudate  0%    Peri-wound Assessment  Erythema (blanchable)    Wound Length (cm)  1.8 cm    Wound Width (cm)  2.5 cm    Wound Depth (cm)  0 cm    Wound Volume (cm^3)  0 cm^3    Wound Surface Area (cm^2)  4.5 cm^2    Margins  Attached edges (approximated)    Drainage Amount  Minimal    Drainage Description  Serous    Treatment  Cleansed;Debridement (Selective)    Selective Debridement - Location  Rt ankle and Lt forefoot    Selective Debridement - Tools Used  Forceps;Scalpel    Selective Debridement - Tissue Removed  biofilm from Rt ankle wound, callous, slough, devitalized tissue     Wound Therapy - Clinical Statement  Wounds with more drainage today and noted hypergranluation from Rt ankle wound.  Changed back to silver only for Rt ankle to assist with this with  tape over at a stretch for extra compression.  Removed large amount of thick callous from perimeter of Lt plantar wound with resultand reduction of depth by 1 cm.  Continued with profore over.  discussed importance of unloading Lt forefoot in order for wound to heal. Pt verbalized understanding and is going to work on this by either finding his other shoe or getting foam insert.     Wound Therapy - Functional Problem List  Difficult to ambulate; pt is wallking on the side of his left foot.     Factors Delaying/Impairing Wound Healing  Altered sensation;Diabetes Mellitus;Infection - systemic/local;Multiple medical problems;Polypharmacy    Hydrotherapy Plan  Debridement;Dressing change;Patient/family education;Pulsatile lavage with suction    Wound Therapy - Frequency  3X / week    Wound Therapy - Current Recommendations  PT    Wound Plan  Continue with current POC.  Measure weekly.  Follow up with pressure relief efforts for Lt Foot.     Dressing   silver hydrofiber both wounds; followed by profore compression bandage system.                PT Short Term Goals - 04/29/18 1300      PT SHORT TERM GOAL #1   Title  PT drainage to decrease to minimal on Rt ankle and LT plantar wounds to decrease cellulitis B     Time  1    Period  Weeks    Status  On-going      PT SHORT TERM GOAL #2   Title  PT to be wearing his off loading shoe     Time  1    Period  Weeks    Status  On-going      PT SHORT TERM GOAL #3   Title  Callous to be debrided from LT plantar foot wound to improve wound healing enviornment     Time  3    Period  Weeks    Status  On-going      PT SHORT TERM GOAL #4   Title  Anterior leg wounds to be healed     Time  3    Period  Weeks    Status  On-going        PT Long Term Goals - 04/29/18 1301  PT LONG TERM GOAL #1   Title  PT RT ankle wound and Lt plantar wound to be 100% granulated to prevent infection     Time  3    Period  Weeks    Status  On-going       PT LONG TERM GOAL #2   Title  PT depth of LT plantar wound to be no greater than 1.0 to demonstrate wound healing.     Time  6    Period  Weeks    Status  On-going      PT LONG TERM GOAL #3   Title  PT wound size on both the above wounds to have decreased by at least 1.5 cm diameter to demonstrate improved wound healing.     Time  6    Period  Weeks    Status  On-going      PT LONG TERM GOAL #4   Title  PT to have a compression garment and be using on a regular basis to keep LE from increasing edema which will delay wound healing.    Time  6    Period  Weeks    Status  On-going              Patient will benefit from skilled therapeutic intervention in order to improve the following deficits and impairments:     Visit Diagnosis: Open wound of plantar aspect of foot, left, sequela  Open wound of right ankle with complication, subsequent encounter  Other abnormalities of gait and mobility     Problem List There are no active problems to display for this patient.  Teena Irani, PTA/CLT [phone]  Teena Irani 05/27/2018, 4:54 PM  [facility] [address] [place], [place], [address] Phone: [phone]   Fax:  [phone]  Name: Danny [name] MRN: [id number] Date of Birth: 11/27/1955

## 2018-05-29 ENCOUNTER — Ambulatory Visit (HOSPITAL_COMMUNITY): Payer: Medicare HMO | Admitting: Physical Therapy

## 2018-05-29 DIAGNOSIS — S91001D Unspecified open wound, right ankle, subsequent encounter: Secondary | ICD-10-CM

## 2018-05-29 DIAGNOSIS — R2689 Other abnormalities of gait and mobility: Secondary | ICD-10-CM | POA: Diagnosis not present

## 2018-05-29 DIAGNOSIS — S91302S Unspecified open wound, left foot, sequela: Secondary | ICD-10-CM

## 2018-05-29 NOTE — Therapy (Signed)
[place], [place], [address] Phone: [phone]   Fax:  [phone]  Wound Care Therapy  Patient Details  Name: Danny [name] MRN: [id number] Date of Birth: 04-Apr-1956 Referring Provider (PT): Allyn Kenner   Encounter Date: 05/29/2018  PT End of Session - 05/29/18 1615    Visit Number  13    Number of Visits  18    Date for PT Re-Evaluation  06/07/18    Authorization Type  [employer]    Authorization Time Period  11/01-->06/13/18    Authorization - Visit Number  13    Authorization - Number of Visits  20    PT Start Time  0867    PT Stop Time  1600    PT Time Calculation (min)  45 min    Activity Tolerance  Patient tolerated treatment well    Behavior During Therapy  [facility] for tasks assessed/performed       Past Medical History:  Diagnosis Date  . Arthritis   . Diabetes mellitus without complication ([place])   . Foot ulcer ([place])   . Hypertension     Past Surgical History:  Procedure Laterality Date  . ANKLE SURGERY    . CHOLECYSTECTOMY    . FOOT SURGERY      There were no vitals filed for this visit.              Wound Therapy - 05/29/18 1608    Subjective  Pt statest that he has an off loading shoe at home but he can not walk properly in it due to the discrepancy between the height of his right and left shoe and his  back problems.    Patient and Family Stated Goals  Wounds to finally heal     Date of Onset  04/26/16   approximate   Prior Treatments  wound center for 11 months.     Evaluation and Treatment Procedures Explained to Patient/Family  Yes    Evaluation and Treatment Procedures  agreed to    Wound Properties Date First Assessed: 04/26/18 Time First Assessed: 0917 Wound Type: Diabetic ulcer Location: Foot Wound Description (Comments): plantar aspect  Present on Admission: Yes   Dressing Type  Silver hydrofiber;Gauze (Comment);Compression wrap    Dressing Changed  Changed    Dressing Status  Old drainage    Dressing Change Frequency  PRN    Site / Wound Assessment  Red    % Wound base Red or Granulating  100%    % Wound base Yellow/Fibrinous Exudate  0%    % Wound base Other/Granulation Tissue (Comment)  --   callous perimeter   Peri-wound Assessment  Edema;Other (Comment)   calloused   Margins  Epibole (rolled edges)    Drainage Amount  Minimal    Drainage Description  Serous    Treatment  Cleansed;Debridement (Selective)    Wound Properties Date First Assessed: 04/26/18 Time First Assessed: 6195 Wound Type: Other (Comment) Location: Ankle Location Orientation: Right;Lateral Present on Admission: Yes   Dressing Type  Silver hydrofiber;Compression wrap    Dressing Changed  Changed    Dressing Status  Clean;Dry;Intact    Dressing Change Frequency  PRN    Site / Wound Assessment  Red    % Wound base Red or Granulating  100%    % Wound base Yellow/Fibrinous Exudate  0%    Peri-wound Assessment  Erythema (blanchable)    Margins  Attached edges (approximated)  Drainage Amount  Minimal    Drainage Description  Serous    Treatment  Cleansed;Debridement (Selective)    Selective Debridement - Location  Rt ankle and Lt forefoot    Selective Debridement - Tools Used  Forceps;Scalpel    Selective Debridement - Tissue Removed  biofilm from Rt ankle wound, callous, slough, devitalized tissue     Wound Therapy - Clinical Statement  Due to chronic wound there is a good chance that pt insurance may cover an orthotic referral for a customized off loading shoe rather than off the shelf.  Therapist also told pt that she feels that PT would benefit from a podiatrist consult due to the chronic nature of his ulcers.  Pt continues to have thich callous along the distal 1/3 plantar aspect of his foot.  Biofilm continues to return to surface of RT ankle wound.  Pt will be decreased to twice a week and no longer needs a 90 minute slot we will decrease to a 45 minute time slot.       Wound Therapy - Functional Problem List  Difficult to ambulate; pt is wallking on the side of his left foot.     Factors Delaying/Impairing Wound Healing  Altered sensation;Diabetes Mellitus;Infection - systemic/local;Multiple medical problems;Polypharmacy    Hydrotherapy Plan  Debridement;Dressing change;Patient/family education    Wound Therapy - Frequency  3X / week    Wound Therapy - Current Recommendations  PT;Other (comment)   Orthotist referral    Wound Plan  Continue with current POC.  Measure weekly.  Follow up with pressure relief efforts for Lt Foot.     Dressing   silver hydrofiber both wounds; followed by profore compression bandage system.                PT Short Term Goals - 05/29/18 1616      PT SHORT TERM GOAL #1   Title  PT drainage to decrease to minimal on Rt ankle and LT plantar wounds to decrease cellulitis B     Time  1    Period  Weeks    Status  On-going      PT SHORT TERM GOAL #2   Title  PT to be wearing his off loading shoe     Time  1    Period  Weeks    Status  On-going      PT SHORT TERM GOAL #3   Title  Callous to be debrided from LT plantar foot wound to improve wound healing enviornment     Time  3    Period  Weeks    Status  On-going      PT SHORT TERM GOAL #4   Title  Anterior leg wounds to be healed     Time  3    Period  Weeks    Status  On-going        PT Long Term Goals - 05/29/18 1617      PT LONG TERM GOAL #1   Title  PT RT ankle wound and Lt plantar wound to be 100% granulated to prevent infection     Time  3    Period  Weeks    Status  On-going      PT LONG TERM GOAL #2   Title  PT depth of LT plantar wound to be no greater than 1.0 to demonstrate wound healing.     Time  6    Period  Weeks    Status  On-going      PT LONG TERM GOAL #3   Title  PT wound size on both the above wounds to have decreased by at least 1.5 cm diameter to demonstrate improved wound healing.     Time  6    Period  Weeks    Status   On-going      PT LONG TERM GOAL #4   Title  PT to have a compression garment and be using on a regular basis to keep LE from increasing edema which will delay wound healing.    Time  6    Period  Weeks    Status  On-going            Plan - 05/29/18 1616    Clinical Impression Statement  see above     Rehab Potential  Fair    PT Frequency  3x / week    PT Duration  6 weeks    PT Treatment/Interventions  Other (comment);ADLs/Self Care Home Management;Patient/family education   debridement and dressing change.    PT Next Visit Plan  continue with debridement and dressing; follow up on orthotic referral     Consulted and Agree with Plan of Care  Patient       Patient will benefit from skilled therapeutic intervention in order to improve the following deficits and impairments:  Abnormal gait, Decreased activity tolerance, Increased edema, Decreased skin integrity  Visit Diagnosis: Open wound of plantar aspect of foot, left, sequela  Open wound of right ankle with complication, subsequent encounter  Other abnormalities of gait and mobility     Problem List There are no active problems to display for this patient. Rayetta Humphrey, PT CLT [phone] 05/29/2018, 4:17 PM  [facility] [address] [place], [place], [address] Phone: [phone]   Fax:  [phone]  Name: Danny [name] MRN: [id number] Date of Birth: 1956-04-02

## 2018-06-03 ENCOUNTER — Ambulatory Visit (HOSPITAL_COMMUNITY): Payer: Medicare HMO | Admitting: Physical Therapy

## 2018-06-03 DIAGNOSIS — S91302S Unspecified open wound, left foot, sequela: Secondary | ICD-10-CM | POA: Diagnosis not present

## 2018-06-03 DIAGNOSIS — S91001D Unspecified open wound, right ankle, subsequent encounter: Secondary | ICD-10-CM | POA: Diagnosis not present

## 2018-06-03 DIAGNOSIS — R2689 Other abnormalities of gait and mobility: Secondary | ICD-10-CM

## 2018-06-03 NOTE — Therapy (Signed)
[place], [place], [address] Phone: [phone]   Fax:  [phone]  Wound Care Therapy  Patient Details  Name: Danny [name] MRN: [id number] Date of Birth: 08/10/1955 Referring Provider (PT): Allyn Kenner   Encounter Date: 06/03/2018  PT End of Session - 06/03/18 1712    Visit Number  14    Number of Visits  18    Date for PT Re-Evaluation  06/07/18    Authorization Type  [employer] medic[employer]    Authorization Time Period  11/01-->06/13/18    Authorization - Visit Number  14    Authorization - Number of Visits  20    PT Start Time  9622    PT Stop Time  1607    PT Time Calculation (min)  49 min    Activity Tolerance  Patient tolerated treatment well    Behavior During Therapy  [facility] for tasks assessed/performed       Past Medical History:  Diagnosis Date  . Arthritis   . Diabetes mellitus without complication ([place])   . Foot ulcer ([place])   . Hypertension     Past Surgical History:  Procedure Laterality Date  . ANKLE SURGERY    . CHOLECYSTECTOMY    . FOOT SURGERY      There were no vitals filed for this visit.              Wound Therapy - 06/03/18 1708    Subjective  pt comes today wearing his offloading shoe on Lt LE. States the dressing came off the bottom of his Lt foot.  All others stayed intact.  no pain.    Patient and Family Stated Goals  Wounds to finally heal     Date of Onset  04/26/16   approximate   Prior Treatments  wound center for 11 months.     Evaluation and Treatment Procedures Explained to Patient/Family  Yes    Evaluation and Treatment Procedures  agreed to    Wound Properties Date First Assessed: 04/26/18 Time First Assessed: 0917 Wound Type: Diabetic ulcer Location: Foot Wound Description (Comments): plantar aspect  Present on Admission: Yes   Dressing Type  Silver hydrofiber;Gauze (Comment);Compression wrap    Dressing Changed  Changed    Dressing Status  Old drainage    Dressing Change Frequency  PRN    Site / Wound Assessment  Red    % Wound base Red or Granulating  100%    % Wound base Yellow/Fibrinous Exudate  0%    % Wound base Other/Granulation Tissue (Comment)  --   callous perimeter   Peri-wound Assessment  Edema;Other (Comment)   calloused   Margins  Epibole (rolled edges)    Drainage Amount  Minimal    Drainage Description  Serous    Treatment  Cleansed;Debridement (Selective)    Wound Properties Date First Assessed: 04/26/18 Time First Assessed: 2979 Wound Type: Other (Comment) Location: Ankle Location Orientation: Right;Lateral Present on Admission: Yes   Dressing Type  Silver hydrofiber;Compression wrap    Dressing Changed  Changed    Dressing Status  Clean;Dry;Intact    Dressing Change Frequency  PRN    Site / Wound Assessment  Red    % Wound base Red or Granulating  100%    % Wound base Yellow/Fibrinous Exudate  0%    Peri-wound Assessment  Erythema (blanchable)    Margins  Attached edges (approximated)    Drainage Amount  Minimal  Drainage Description  Serous    Treatment  Cleansed;Debridement (Selective)    Selective Debridement - Location  Rt ankle and Lt forefoot    Selective Debridement - Tools Used  Forceps;Scalpel    Selective Debridement - Tissue Removed  biofilm from Rt ankle wound, callous, slough, devitalized tissue     Wound Therapy - Clinical Statement  Noted improvement in size of both wounds today.  Both are approximating welll and fungal area also appears to be reducing inferior to toes on Lt LE. continued with current POC of cleansing, debridment and dressing change using silver and profore.  Affixed dressing on plantar surface of Lt foot with toe bandaging.  Pt reported overall comfort.  Pt givne instructions to go ahead and make appt with ortho for Lt ankle as it may take a month to get one.     Wound Therapy - Functional Problem List  Difficult to ambulate; pt is wallking on the side of his left foot.     Factors  Delaying/Impairing Wound Healing  Altered sensation;Diabetes Mellitus;Infection - systemic/local;Multiple medical problems;Polypharmacy    Hydrotherapy Plan  Debridement;Dressing change;Patient/family education    Wound Therapy - Frequency  3X / week    Wound Therapy - Current Recommendations  PT;Other (comment)   Orthotist referral    Wound Plan  Continue with current POC.  Measure weekly.     Dressing   silver hydrofiber both wounds; followed by profore compression bandage system.              PT Education - 06/03/18 1712    Education Details  make appt with orthopedist for Rt ankle/shoe consult    Person(s) Educated  Patient    Methods  Explanation    Comprehension  Verbalized understanding       PT Short Term Goals - 05/29/18 1616      PT SHORT TERM GOAL #1   Title  PT drainage to decrease to minimal on Rt ankle and LT plantar wounds to decrease cellulitis B     Time  1    Period  Weeks    Status  On-going      PT SHORT TERM GOAL #2   Title  PT to be wearing his off loading shoe     Time  1    Period  Weeks    Status  On-going      PT SHORT TERM GOAL #3   Title  Callous to be debrided from LT plantar foot wound to improve wound healing enviornment     Time  3    Period  Weeks    Status  On-going      PT SHORT TERM GOAL #4   Title  Anterior leg wounds to be healed     Time  3    Period  Weeks    Status  On-going        PT Long Term Goals - 05/29/18 1617      PT LONG TERM GOAL #1   Title  PT RT ankle wound and Lt plantar wound to be 100% granulated to prevent infection     Time  3    Period  Weeks    Status  On-going      PT LONG TERM GOAL #2   Title  PT depth of LT plantar wound to be no greater than 1.0 to demonstrate wound healing.     Time  6    Period  Weeks  Status  On-going      PT LONG TERM GOAL #3   Title  PT wound size on both the above wounds to have decreased by at least 1.5 cm diameter to demonstrate improved wound healing.      Time  6    Period  Weeks    Status  On-going      PT LONG TERM GOAL #4   Title  PT to have a compression garment and be using on a regular basis to keep LE from increasing edema which will delay wound healing.    Time  6    Period  Weeks    Status  On-going              Patient will benefit from skilled therapeutic intervention in order to improve the following deficits and impairments:     Visit Diagnosis: Open wound of plantar aspect of foot, left, sequela  Open wound of right ankle with complication, subsequent encounter  Other abnormalities of gait and mobility     Problem List There are no active problems to display for this patient.  Teena Irani, PTA/CLT [phone]  Teena Irani 06/03/2018, 5:13 PM  [facility] [address] [place], [place], [address] Phone: [phone]   Fax:  [phone]  Name: Danny [name] MRN: [id number] Date of Birth: 03-Oct-1955

## 2018-06-05 ENCOUNTER — Ambulatory Visit (HOSPITAL_COMMUNITY): Payer: Medicare HMO

## 2018-06-06 ENCOUNTER — Ambulatory Visit (HOSPITAL_COMMUNITY): Payer: Medicare HMO | Admitting: Physical Therapy

## 2018-06-06 DIAGNOSIS — S91001D Unspecified open wound, right ankle, subsequent encounter: Secondary | ICD-10-CM | POA: Diagnosis not present

## 2018-06-06 DIAGNOSIS — S91302S Unspecified open wound, left foot, sequela: Secondary | ICD-10-CM | POA: Diagnosis not present

## 2018-06-06 DIAGNOSIS — R2689 Other abnormalities of gait and mobility: Secondary | ICD-10-CM

## 2018-06-06 NOTE — Therapy (Signed)
[place], [place], [address] Phone: [phone]   Fax:  [phone]  Wound Care Therapy  Patient Details  Name: Danny [name] MRN: [id number] Date of Birth: 06/08/1956 Referring Provider (PT): Allyn Kenner   Encounter Date: 06/06/2018  PT End of Session - 06/06/18 1730    Visit Number  15    Number of Visits  18    Date for PT Re-Evaluation  06/13/18    Authorization Type  [employer]are    Authorization Time Period  11/01-->06/13/18    Authorization - Visit Number  15    Authorization - Number of Visits  20    PT Start Time  1600    PT Stop Time  1650    PT Time Calculation (min)  50 min    Activity Tolerance  Patient tolerated treatment well    Behavior During Therapy  [facility] for tasks assessed/performed       Past Medical History:  Diagnosis Date  . Arthritis   . Diabetes mellitus without complication ([place])   . Foot ulcer ([place])   . Hypertension     Past Surgical History:  Procedure Laterality Date  . ANKLE SURGERY    . CHOLECYSTECTOMY    . FOOT SURGERY      There were no vitals filed for this visit.              Wound Therapy - 06/06/18 1727    Subjective  pt reports no issues or complaints.    Patient and Family Stated Goals  Wounds to finally heal     Date of Onset  04/26/16   approximate   Prior Treatments  wound center for 11 months.     Pain Scale  0-10    Pain Score  0-No pain    Evaluation and Treatment Procedures Explained to Patient/Family  Yes    Evaluation and Treatment Procedures  agreed to    Wound Properties Date First Assessed: 04/26/18 Time First Assessed: 0917 Wound Type: Diabetic ulcer Location: Foot Wound Description (Comments): plantar aspect  Present on Admission: Yes   Dressing Type  Silver hydrofiber;Gauze (Comment);Compression wrap    Dressing Status  Old drainage    Dressing Change Frequency  PRN    Site / Wound Assessment  Red    % Wound base Red or  Granulating  100%    % Wound base Yellow/Fibrinous Exudate  0%    % Wound base Other/Granulation Tissue (Comment)  --   callous perimeter   Peri-wound Assessment  Edema;Other (Comment)   calloused   Margins  Epibole (rolled edges)    Drainage Amount  Moderate    Drainage Description  Serous    Treatment  Cleansed;Debridement (Selective)    Wound Properties Date First Assessed: 04/26/18 Time First Assessed: 3154 Wound Type: Other (Comment) Location: Ankle Location Orientation: Right;Lateral Present on Admission: Yes   Dressing Type  Silver hydrofiber;Compression wrap    Dressing Changed  Changed    Dressing Status  Clean;Dry;Intact    Dressing Change Frequency  PRN    Site / Wound Assessment  Red    % Wound base Red or Granulating  100%    % Wound base Yellow/Fibrinous Exudate  0%    Peri-wound Assessment  Erythema (blanchable)    Margins  Attached edges (approximated)    Drainage Amount  Minimal    Drainage Description  Serous    Treatment  Cleansed;Debridement (Selective)  Selective Debridement - Location  Rt ankle and Lt forefoot    Selective Debridement - Tools Used  Forceps;Scalpel    Selective Debridement - Tissue Removed  biofilm from Rt ankle wound, callous, slough, devitalized tissue     Wound Therapy - Clinical Statement  more drainage noted this session plantar Lt foot.  Rt ankle continues to approximate well and Lt filling in slowly.  Continued with silver hydrofiber and extra padding to absorb drainage.      Wound Therapy - Functional Problem List  Difficult to ambulate; pt is wallking on the side of his left foot.     Factors Delaying/Impairing Wound Healing  Altered sensation;Diabetes Mellitus;Infection - systemic/local;Multiple medical problems;Polypharmacy    Hydrotherapy Plan  Debridement;Dressing change;Patient/family education    Wound Therapy - Frequency  3X / week    Wound Therapy - Current Recommendations  PT;Other (comment)   Orthotist referral    Wound Plan   Continue with current POC.  Measure weekly.     Dressing   silver hydrofiber both wounds; followed by profore compression bandage system.                PT Short Term Goals - 05/29/18 1616      PT SHORT TERM GOAL #1   Title  PT drainage to decrease to minimal on Rt ankle and LT plantar wounds to decrease cellulitis B     Time  1    Period  Weeks    Status  On-going      PT SHORT TERM GOAL #2   Title  PT to be wearing his off loading shoe     Time  1    Period  Weeks    Status  On-going      PT SHORT TERM GOAL #3   Title  Callous to be debrided from LT plantar foot wound to improve wound healing enviornment     Time  3    Period  Weeks    Status  On-going      PT SHORT TERM GOAL #4   Title  Anterior leg wounds to be healed     Time  3    Period  Weeks    Status  On-going        PT Long Term Goals - 05/29/18 1617      PT LONG TERM GOAL #1   Title  PT RT ankle wound and Lt plantar wound to be 100% granulated to prevent infection     Time  3    Period  Weeks    Status  On-going      PT LONG TERM GOAL #2   Title  PT depth of LT plantar wound to be no greater than 1.0 to demonstrate wound healing.     Time  6    Period  Weeks    Status  On-going      PT LONG TERM GOAL #3   Title  PT wound size on both the above wounds to have decreased by at least 1.5 cm diameter to demonstrate improved wound healing.     Time  6    Period  Weeks    Status  On-going      PT LONG TERM GOAL #4   Title  PT to have a compression garment and be using on a regular basis to keep LE from increasing edema which will delay wound healing.    Time  6    Period  Weeks    Status  On-going              Patient will benefit from skilled therapeutic intervention in order to improve the following deficits and impairments:     Visit Diagnosis: Open wound of plantar aspect of foot, left, sequela  Open wound of right ankle with complication, subsequent encounter  Other  abnormalities of gait and mobility     Problem List There are no active problems to display for this patient.  Teena Irani, PTA/CLT [phone]  Teena Irani 06/06/2018, 5:30 PM  [facility] [address] [place], [place], [address] Phone: [phone]   Fax:  [phone]  Name: Denzil Mceachron MRN: [id number] Date of Birth: 11-May-1956

## 2018-06-10 ENCOUNTER — Ambulatory Visit (HOSPITAL_COMMUNITY): Payer: Medicare HMO | Admitting: Physical Therapy

## 2018-06-10 DIAGNOSIS — R2689 Other abnormalities of gait and mobility: Secondary | ICD-10-CM | POA: Diagnosis not present

## 2018-06-10 DIAGNOSIS — S91001D Unspecified open wound, right ankle, subsequent encounter: Secondary | ICD-10-CM

## 2018-06-10 DIAGNOSIS — S91302S Unspecified open wound, left foot, sequela: Secondary | ICD-10-CM

## 2018-06-10 NOTE — Therapy (Signed)
[place], [place], [address] Phone: [phone]   Fax:  [phone]  Wound Care Therapy  Patient Details  Name: Danny [name] MRN: [id number] Date of Birth: Nov 30, 1955 Referring Provider (PT): Allyn Kenner   Encounter Date: 06/10/2018  PT End of Session - 06/10/18 1809    Visit Number  16    Number of Visits  18    Date for PT Re-Evaluation  06/13/18    Authorization Type  [employer] medicare    Authorization Time Period  11/01-->06/13/18    Authorization - Visit Number  16    Authorization - Number of Visits  20    Activity Tolerance  Patient tolerated treatment well    Behavior During Therapy  [facility] for tasks assessed/performed       Past Medical History:  Diagnosis Date  . Arthritis   . Diabetes mellitus without complication ([place])   . Foot ulcer ([place])   . Hypertension     Past Surgical History:  Procedure Laterality Date  . ANKLE SURGERY    . CHOLECYSTECTOMY    . FOOT SURGERY      There were no vitals filed for this visit.   Subjective Assessment - 06/10/18 1802    Subjective  Pt states he is doing well.  Dressings intact no pain                Wound Therapy - 06/10/18 1802    Subjective  pt states he did not get around to calling an orthopedist for his Rt ankle.  STates he will do this tomorrow.  Pt reports no issues with drssings intact.     Pain Scale  0-10    Pain Score  0-No pain    Evaluation and Treatment Procedures Explained to Patient/Family  Yes    Evaluation and Treatment Procedures  agreed to    Wound Properties Date First Assessed: 04/26/18 Time First Assessed: 0917 Wound Type: Diabetic ulcer Location: Foot Wound Description (Comments): plantar aspect  Present on Admission: Yes   Dressing Type  Silver hydrofiber;Gauze (Comment);Compression wrap    Dressing Changed  Changed    Dressing Status  Old drainage    Dressing Change Frequency  PRN    Site / Wound Assessment  Red    %  Wound base Red or Granulating  100%    % Wound base Yellow/Fibrinous Exudate  0%    Peri-wound Assessment  Other (Comment)   calloused   Wound Length (cm)  1.8 cm   was 1.8   Wound Width (cm)  2.4 cm   was 2.4   Wound Depth (cm)  0.4 cm   was 0.5   Wound Volume (cm^3)  1.73 cm^3    Wound Surface Area (cm^2)  4.32 cm^2    Margins  Epibole (rolled edges)    Drainage Amount  Moderate    Drainage Description  Serous    Treatment  Cleansed;Debridement (Selective)    Wound Properties Date First Assessed: 04/26/18 Time First Assessed: 7619 Wound Type: Other (Comment) Location: Ankle Location Orientation: Right;Lateral Present on Admission: Yes   Dressing Type  Silver hydrofiber;Compression wrap    Dressing Changed  Changed    Dressing Status  Clean;Dry;Intact    Dressing Change Frequency  PRN    Site / Wound Assessment  Red    % Wound base Red or Granulating  100%    % Wound base Yellow/Fibrinous Exudate  0%    Wound  Length (cm)  1.5 cm   was 1.8   Wound Width (cm)  1.3 cm   was 2.5   Wound Depth (cm)  0 cm    Wound Volume (cm^3)  0 cm^3    Wound Surface Area (cm^2)  1.95 cm^2    Margins  Attached edges (approximated)    Drainage Amount  Minimal    Drainage Description  Serous    Treatment  Cleansed;Debridement (Selective)    Selective Debridement - Location  Rt ankle and Lt forefoot    Selective Debridement - Tools Used  Forceps;Scalpel    Selective Debridement - Tissue Removed  biofilm from Rt ankle wound, callous, slough, devitalized tissue     Wound Therapy - Clinical Statement  Rt ankle is healing quickly, approximating well.  Plantar Lt foot taking increase time to heal due to thick callous and difficulty approximating.  Shaved large amount of callous from perimeter with central wound remaining 100% granulated.  Continued with silver hydrofiber and profore.     Wound Therapy - Functional Problem List  Difficult to ambulate; pt is wallking on the side of his left foot.      Factors Delaying/Impairing Wound Healing  Altered sensation;Diabetes Mellitus;Infection - systemic/local;Multiple medical problems;Polypharmacy    Hydrotherapy Plan  Debridement;Dressing change;Patient/family education    Wound Therapy - Frequency  3X / week    Wound Therapy - Current Recommendations  PT;Other (comment)    Wound Plan  Continue with current POC.  Measure weekly.  re-evaluate next session.    Dressing   silver hydrofiber both wounds; followed by profore compression bandage system.                PT Short Term Goals - 05/29/18 1616      PT SHORT TERM GOAL #1   Title  PT drainage to decrease to minimal on Rt ankle and LT plantar wounds to decrease cellulitis B     Time  1    Period  Weeks    Status  On-going      PT SHORT TERM GOAL #2   Title  PT to be wearing his off loading shoe     Time  1    Period  Weeks    Status  On-going      PT SHORT TERM GOAL #3   Title  Callous to be debrided from LT plantar foot wound to improve wound healing enviornment     Time  3    Period  Weeks    Status  On-going      PT SHORT TERM GOAL #4   Title  Anterior leg wounds to be healed     Time  3    Period  Weeks    Status  On-going        PT Long Term Goals - 05/29/18 1617      PT LONG TERM GOAL #1   Title  PT RT ankle wound and Lt plantar wound to be 100% granulated to prevent infection     Time  3    Period  Weeks    Status  On-going      PT LONG TERM GOAL #2   Title  PT depth of LT plantar wound to be no greater than 1.0 to demonstrate wound healing.     Time  6    Period  Weeks    Status  On-going      PT LONG TERM GOAL #3   Title  PT wound size on both the above wounds to have decreased by at least 1.5 cm diameter to demonstrate improved wound healing.     Time  6    Period  Weeks    Status  On-going      PT LONG TERM GOAL #4   Title  PT to have a compression garment and be using on a regular basis to keep LE from increasing edema which will delay  wound healing.    Time  6    Period  Weeks    Status  On-going              Patient will benefit from skilled therapeutic intervention in order to improve the following deficits and impairments:     Visit Diagnosis: Open wound of plantar aspect of foot, left, sequela  Open wound of right ankle with complication, subsequent encounter  Other abnormalities of gait and mobility     Problem List There are no active problems to display for this patient.  Teena Irani, PTA/CLT [phone]  Teena Irani 06/10/2018, 6:09 PM  [facility] [address] [place], [place], [address] Phone: [phone]   Fax:  [phone]  Name: Danny [name] MRN: [id number] Date of Birth: 1956-02-03

## 2018-06-12 ENCOUNTER — Ambulatory Visit (HOSPITAL_COMMUNITY): Payer: Medicare HMO

## 2018-06-12 ENCOUNTER — Telehealth (HOSPITAL_COMMUNITY): Payer: Self-pay

## 2018-06-12 NOTE — Telephone Encounter (Signed)
Pt called stating he had a low blood sugar and could not find our number when it happen.

## 2018-06-14 ENCOUNTER — Other Ambulatory Visit: Payer: Self-pay | Admitting: Internal Medicine

## 2018-06-14 ENCOUNTER — Other Ambulatory Visit (HOSPITAL_COMMUNITY): Payer: Self-pay | Admitting: Internal Medicine

## 2018-06-14 DIAGNOSIS — I872 Venous insufficiency (chronic) (peripheral): Secondary | ICD-10-CM

## 2018-06-17 ENCOUNTER — Ambulatory Visit (HOSPITAL_COMMUNITY): Payer: Medicare HMO | Admitting: Physical Therapy

## 2018-06-17 ENCOUNTER — Telehealth (HOSPITAL_COMMUNITY): Payer: Self-pay | Admitting: Physical Therapy

## 2018-06-17 DIAGNOSIS — R2689 Other abnormalities of gait and mobility: Secondary | ICD-10-CM

## 2018-06-17 DIAGNOSIS — S91302S Unspecified open wound, left foot, sequela: Secondary | ICD-10-CM

## 2018-06-17 DIAGNOSIS — S91001D Unspecified open wound, right ankle, subsequent encounter: Secondary | ICD-10-CM | POA: Diagnosis not present

## 2018-06-17 NOTE — Telephone Encounter (Signed)
Order received for ABI. Called MD office to inform they are to set that up that therapy does not schedule this.   Teena Irani, PTA/CLT [phone]

## 2018-06-17 NOTE — Therapy (Signed)
[place], [place], [address] Phone: [phone]   Fax:  [phone]  Wound Care Therapy  Patient Details  Name: Danny [name] MRN: [id number] Date of Birth: [birth year] Referring Provider (PT): Allyn Kenner   Encounter Date: 06/17/2018  PT End of Session - 06/17/18 1746    Visit Number  17    Number of Visits  29   Date for PT Re-Evaluation  06/13/18    Authorization Type  [employer]are; recert sent 62/37    Authorization Time Period  11/01-->06/13/18    Authorization - Visit Number  45    Authorization - Number of Visits  29   PT Start Time  1648    PT Stop Time  1730    PT Time Calculation (min)  42 min    Activity Tolerance  Patient tolerated treatment well    Behavior During Therapy  [facility] for tasks assessed/performed       Past Medical History:  Diagnosis Date  . Arthritis   . Diabetes mellitus without complication ([place])   . Foot ulcer ([place])   . Hypertension     Past Surgical History:  Procedure Laterality Date  . ANKLE SURGERY    . CHOLECYSTECTOMY    . FOOT SURGERY      There were no vitals filed for this visit.              Wound Therapy - 06/17/18 1742    Subjective  pt states his blood sugar dropped and was unable to make his last appointment.  Today makes 1 week since last seen.  No issues.     Pain Scale  0-10    Pain Score  0-No pain    Evaluation and Treatment Procedures Explained to Patient/Family  Yes    Evaluation and Treatment Procedures  agreed to    Wound Properties Date First Assessed: 04/26/18 Time First Assessed: 0917 Wound Type: Diabetic ulcer Location: Foot Wound Description (Comments): plantar aspect  Present on Admission: Yes   Dressing Type  Silver hydrofiber;Gauze (Comment);Compression wrap    Dressing Changed  Changed    Dressing Status  Old drainage    Dressing Change Frequency  PRN    Site / Wound Assessment  Red    % Wound base Red or Granulating  100%     % Wound base Yellow/Fibrinous Exudate  0%    Peri-wound Assessment  Other (Comment)   calloused   Wound Length (cm)  1.8 cm    Wound Width (cm)  2.4 cm    Wound Depth (cm)  0.3 cm    Wound Volume (cm^3)  1.3 cm^3    Wound Surface Area (cm^2)  4.32 cm^2    Margins  Epibole (rolled edges)    Drainage Amount  Moderate    Drainage Description  Serous    Treatment  Cleansed;Debridement (Selective)    Wound Properties Date First Assessed: 04/26/18 Time First Assessed: 6283 Wound Type: Other (Comment) Location: Ankle Location Orientation: Right;Lateral Present on Admission: Yes   Dressing Type  Silver hydrofiber;Compression wrap    Dressing Changed  Changed    Dressing Status  Clean;Dry;Intact    Dressing Change Frequency  PRN    Site / Wound Assessment  Red    % Wound base Red or Granulating  100%    % Wound base Yellow/Fibrinous Exudate  0%    Wound Length (cm)  1.3 cm    Wound Width (cm)  1.3 cm    Wound Depth (cm)  0 cm    Wound Volume (cm^3)  0 cm^3    Wound Surface Area (cm^2)  1.69 cm^2    Margins  Attached edges (approximated)    Drainage Amount  Minimal    Drainage Description  Serous    Treatment  Cleansed;Debridement (Selective)    Selective Debridement - Location  Rt ankle and Lt forefoot    Selective Debridement - Tools Used  Forceps;Scalpel    Selective Debridement - Tissue Removed  biofilm from Rt ankle wound, callous, slough, devitalized tissue     Wound Therapy - Clinical Statement  both wounds remeasured this session with further reduction in size/depth as compared to last week.  Cleansed well, debrided and contineud with silverhydrofiber.  Pt will require further skilled wound care for proper healing and avoidance of infection.  PT will continue to benefit from skilled physical therapy to keep a healing environment for his chronic wounds as prior to therapy they wounds were deteriorating and now they are slowly healing.    Wound Therapy - Functional Problem List   Difficult to ambulate; pt is wallking on the side of his left foot.     Factors Delaying/Impairing Wound Healing  Altered sensation;Diabetes Mellitus;Infection - systemic/local;Multiple medical problems;Polypharmacy    Hydrotherapy Plan  Debridement;Dressing change;Patient/family education    Wound Therapy - Frequency  3X / week.  Decrease to 2x a week for the next 6 weeks.    Wound Therapy - Current Recommendations  PT;Other (comment)    Wound Plan  Continue with current POC.  Measure weekly.     Dressing   silver hydrofiber both wounds; followed by profore compression bandage system.                PT Short Term Goals - 05/29/18 1616      PT SHORT TERM GOAL #1   Title  PT drainage to decrease to minimal on Rt ankle and LT plantar wounds to decrease cellulitis B     Time  1    Period  Weeks    Status  On-going      PT SHORT TERM GOAL #2   Title  PT to be wearing his off loading shoe     Time  1    Period  Weeks    Status  On-going      PT SHORT TERM GOAL #3   Title  Callous to be debrided from LT plantar foot wound to improve wound healing enviornment     Time  3    Period  Weeks    Status  On-going      PT SHORT TERM GOAL #4   Title  Anterior leg wounds to be healed     Time  3    Period  Weeks    Status  On-going        PT Long Term Goals - 05/29/18 1617      PT LONG TERM GOAL #1   Title  PT RT ankle wound and Lt plantar wound to be 100% granulated to prevent infection     Time  3    Period  Weeks    Status  On-going      PT LONG TERM GOAL #2   Title  PT depth of LT plantar wound to be no greater than 1.0 to demonstrate wound healing.     Time  6    Period  Weeks  Status  On-going      PT LONG TERM GOAL #3   Title  PT wound size on both the above wounds to have decreased by at least 1.5 cm diameter to demonstrate improved wound healing.     Time  6    Period  Weeks    Status  On-going      PT LONG TERM GOAL #4   Title  PT to have a compression  garment and be using on a regular basis to keep LE from increasing edema which will delay wound healing.    Time  6    Period  Weeks    Status  On-going              Patient will benefit from skilled therapeutic intervention in order to improve the following deficits and impairments:     Visit Diagnosis: Open wound of plantar aspect of foot, left, sequela  Open wound of right ankle with complication, subsequent encounter  Other abnormalities of gait and mobility     Problem List There are no active problems to display for this patient.  Teena Irani, PTA/CLT [place], [place] CLT [phone] 06/17/2018, 5:47 PM  [facility] [address] [place], [place], [address] Phone: [phone]   Fax:  [phone]  Name: Danny [name] MRN: [id number] Date of Birth: 07-Jan-1956

## 2018-06-20 ENCOUNTER — Encounter (HOSPITAL_COMMUNITY): Payer: Self-pay

## 2018-06-20 ENCOUNTER — Ambulatory Visit (HOSPITAL_COMMUNITY): Payer: Medicare HMO

## 2018-06-20 ENCOUNTER — Other Ambulatory Visit: Payer: Self-pay

## 2018-06-20 ENCOUNTER — Telehealth (HOSPITAL_COMMUNITY): Payer: Self-pay

## 2018-06-20 DIAGNOSIS — S91302S Unspecified open wound, left foot, sequela: Secondary | ICD-10-CM

## 2018-06-20 DIAGNOSIS — S91001D Unspecified open wound, right ankle, subsequent encounter: Secondary | ICD-10-CM | POA: Diagnosis not present

## 2018-06-20 DIAGNOSIS — R2689 Other abnormalities of gait and mobility: Secondary | ICD-10-CM

## 2018-06-20 NOTE — Telephone Encounter (Signed)
Left message for apt opening earlier today.   [address] [place]; [employer] [phone]

## 2018-06-20 NOTE — Therapy (Signed)
[place], [place], [address] Phone: [phone]   Fax:  [phone]  Wound Care Therapy  Patient Details  Name: Danny [name] MRN: [id number] Date of Birth: Jun 22, 1956 Referring Provider (PT): Allyn Kenner   Encounter Date: 06/20/2018  PT End of Session - 06/20/18 1740    Visit Number  18    Number of Visits  29    Date for PT Re-Evaluation  07/29/18    Authorization Type  [employer]; recert sent 13/24    Authorization Time Period  12/23 thr 07/29/2018    Authorization - Visit Number  18    Authorization - Number of Visits  29    PT Start Time  4010    PT Stop Time  1607    PT Time Calculation (min)  52 min    Activity Tolerance  Patient tolerated treatment well    Behavior During Therapy  [facility] for tasks assessed/performed       Past Medical History:  Diagnosis Date  . Arthritis   . Diabetes mellitus without complication ([place])   . Foot ulcer ([place])   . Hypertension     Past Surgical History:  Procedure Laterality Date  . ANKLE SURGERY    . CHOLECYSTECTOMY    . FOOT SURGERY      There were no vitals filed for this visit.   Wound Therapy - 06/20/18 1723    Subjective  Patient reports he is well and had a good holiday. He denies pain and states his blood sugar was good today.    Pain Scale  0-10    Pain Score  0-No pain    Evaluation and Treatment Procedures Explained to Patient/Family  Yes    Evaluation and Treatment Procedures  agreed to    Wound Properties Date First Assessed: 04/26/18 Time First Assessed: 0917 Wound Type: Diabetic ulcer Location: Foot Wound Description (Comments): plantar aspect  Present on Admission: Yes   Dressing Type  Silver hydrofiber;Gauze (Comment);Compression wrap    Dressing Changed  Changed    Dressing Status  Old drainage    Dressing Change Frequency  PRN    Site / Wound Assessment  Red    % Wound base Red or Granulating  100%    % Wound base Yellow/Fibrinous  Exudate  0%    Peri-wound Assessment  Other (Comment)   calloused   Margins  Epibole (rolled edges)    Drainage Amount  Moderate    Drainage Description  Serous    Treatment  Cleansed;Debridement (Selective)    Wound Properties Date First Assessed: 04/26/18 Time First Assessed: 2725 Wound Type: Other (Comment) Location: Ankle Location Orientation: Right;Lateral Present on Admission: Yes   Dressing Type  Silver hydrofiber;Compression wrap;Impregnated gauze (bismuth)    Dressing Changed  Changed    Dressing Status  Clean;Dry;Intact    Dressing Change Frequency  PRN    Site / Wound Assessment  Red    % Wound base Red or Granulating  100%    % Wound base Yellow/Fibrinous Exudate  0%    Margins  Attached edges (approximated)    Drainage Amount  Minimal    Drainage Description  Serous    Treatment  Cleansed;Debridement (Selective)    Selective Debridement - Location  Rt ankle and Lt forefoot    Selective Debridement - Tools Used  Forceps;Scalpel    Selective Debridement - Tissue Removed  biofilm from Rt ankle wound, callous, slough, devitalized tissue from LT  plantar wound    Wound Therapy - Clinical Statement  Wounds cleansed well and debrided today. Both wound bed are red granulated surfaces and continued with silver hydrofiber dressing and profore wrap on bil LE's. Rt lateral malleoli wound required minimal debridement at wound edges to maintain open edges. Lt plantar wound required debridement of callous and patient educated again on importance of wearing darco shoe to unweight wound at all time when mobilizing. He will continue to benefit from skilled wound care to promote proper healing and reduce infection risk.     Wound Therapy - Functional Problem List  Difficult to ambulate; pt is wallking on the side of his left foot.     Factors Delaying/Impairing Wound Healing  Altered sensation;Diabetes Mellitus;Infection - systemic/local;Multiple medical problems;Polypharmacy    Hydrotherapy Plan   Debridement;Dressing change;Patient/family education    Wound Therapy - Frequency  3X / week    Wound Therapy - Current Recommendations  PT;Other (comment)    Wound Plan  Continue with current POC.  Measure weekly.     Dressing   xeroform and silver hydrofiber on Rt malleoli wound and silver hydrofiber on Lt plantar wound; followed by profore compression bandage system to both.        PT Short Term Goals - 05/29/18 1616      PT SHORT TERM GOAL #1   Title  PT drainage to decrease to minimal on Rt ankle and LT plantar wounds to decrease cellulitis B     Time  1    Period  Weeks    Status  On-going      PT SHORT TERM GOAL #2   Title  PT to be wearing his off loading shoe     Time  1    Period  Weeks    Status  On-going      PT SHORT TERM GOAL #3   Title  Callous to be debrided from LT plantar foot wound to improve wound healing enviornment     Time  3    Period  Weeks    Status  On-going      PT SHORT TERM GOAL #4   Title  Anterior leg wounds to be healed     Time  3    Period  Weeks    Status  On-going        PT Long Term Goals - 05/29/18 1617      PT LONG TERM GOAL #1   Title  PT RT ankle wound and Lt plantar wound to be 100% granulated to prevent infection     Time  3    Period  Weeks    Status  On-going      PT LONG TERM GOAL #2   Title  PT depth of LT plantar wound to be no greater than 1.0 to demonstrate wound healing.     Time  6    Period  Weeks    Status  On-going      PT LONG TERM GOAL #3   Title  PT wound size on both the above wounds to have decreased by at least 1.5 cm diameter to demonstrate improved wound healing.     Time  6    Period  Weeks    Status  On-going      PT LONG TERM GOAL #4   Title  PT to have a compression garment and be using on a regular basis to keep LE from increasing edema which will delay  wound healing.    Time  6    Period  Weeks    Status  On-going         Plan - 06/20/18 1741    Clinical Impression Statement  see  above    Rehab Potential  Fair    PT Frequency  3x / week    PT Duration  6 weeks    PT Treatment/Interventions  Other (comment);ADLs/Self Care Home Management;Patient/family education   debridement and dressing change.    PT Next Visit Plan  continue with debridement and dressing; follow up on orthotic referral     Consulted and Agree with Plan of Care  Patient       Patient will benefit from skilled therapeutic intervention in order to improve the following deficits and impairments:  Abnormal gait, Decreased activity tolerance, Increased edema, Decreased skin integrity  Visit Diagnosis: Open wound of plantar aspect of foot, left, sequela  Open wound of right ankle with complication, subsequent encounter  Other abnormalities of gait and mobility     Problem List There are no active problems to display for this patient.   Kipp Brood, PT, DPT Physical Therapist with [facility]  06/20/2018 5:41 PM    [place] [address] [place], [place], [address] Phone: [phone]   Fax:  [phone]  Name: Danny [name] MRN: [id number] Date of Birth: 05-04-1956

## 2018-06-24 ENCOUNTER — Ambulatory Visit (HOSPITAL_COMMUNITY): Payer: Medicare HMO | Admitting: Physical Therapy

## 2018-06-24 ENCOUNTER — Encounter (HOSPITAL_COMMUNITY): Payer: Self-pay | Admitting: Physical Therapy

## 2018-06-24 DIAGNOSIS — S91001D Unspecified open wound, right ankle, subsequent encounter: Secondary | ICD-10-CM

## 2018-06-24 DIAGNOSIS — S91302S Unspecified open wound, left foot, sequela: Secondary | ICD-10-CM | POA: Diagnosis not present

## 2018-06-24 DIAGNOSIS — R2689 Other abnormalities of gait and mobility: Secondary | ICD-10-CM | POA: Diagnosis not present

## 2018-06-24 NOTE — Therapy (Signed)
[place], [place], [address] Phone: [phone]   Fax:  [phone]  Wound Care Therapy  Patient Details  Name: Danny [name] MRN: [id number] Date of Birth: 09-18-1955 Referring Provider (PT): Allyn Kenner   Encounter Date: 06/24/2018  PT End of Session - 06/24/18 1615    Visit Number  19    Number of Visits  29    Date for PT Re-Evaluation  07/29/18    Authorization Type  [employer]; recert sent 37/16    Authorization Time Period  12/23 thr 07/29/2018    Authorization - Visit Number  65    Authorization - Number of Visits  29    PT Start Time  1430    PT Stop Time  1515    PT Time Calculation (min)  45 min    Activity Tolerance  Patient tolerated treatment well    Behavior During Therapy  [facility] for tasks assessed/performed       Past Medical History:  Diagnosis Date  . Arthritis   . Diabetes mellitus without complication ([place])   . Foot ulcer ([place])   . Hypertension     Past Surgical History:  Procedure Laterality Date  . ANKLE SURGERY    . CHOLECYSTECTOMY    . FOOT SURGERY      There were no vitals filed for this visit.              Wound Therapy - 06/24/18 1604    Subjective  Patient states that he has not gone or  made an appointment with the orthotist yet.    Pain Scale  0-10    Evaluation and Treatment Procedures Explained to Patient/Family  Yes    Evaluation and Treatment Procedures  agreed to    Wound Properties Date First Assessed: 04/26/18 Time First Assessed: 0917 Wound Type: Diabetic ulcer Location: Foot Wound Description (Comments): plantar aspect  Present on Admission: Yes   Dressing Type  Silver hydrofiber;Gauze (Comment);Compression wrap    Dressing Changed  Changed    Dressing Status  Old drainage    Dressing Change Frequency  PRN    Site / Wound Assessment  Red    % Wound base Red or Granulating  100%    % Wound base Yellow/Fibrinous Exudate  0%    Peri-wound  Assessment  Other (Comment)   calloused   Wound Length (cm)  1.2 cm   was 1.8   Wound Width (cm)  2.3 cm   was 2.4    Wound Depth (cm)  0.4 cm   ws .3    Wound Volume (cm^3)  1.1 cm^3    Wound Surface Area (cm^2)  2.76 cm^2    Margins  Epibole (rolled edges)    Drainage Amount  Moderate    Drainage Description  Serous    Treatment  Cleansed;Debridement (Selective)    Wound Properties Date First Assessed: 04/26/18 Time First Assessed: 9678 Wound Type: Other (Comment) Location: Ankle Location Orientation: Right;Lateral Present on Admission: Yes   Dressing Type  Silver hydrofiber;Compression wrap;Impregnated gauze (bismuth)    Dressing Changed  Changed    Dressing Status  Clean;Dry;Intact    Dressing Change Frequency  PRN    Site / Wound Assessment  Red    % Wound base Red or Granulating  100%    % Wound base Yellow/Fibrinous Exudate  0%    Wound Length (cm)  0.7 cm   was 1.3   Wound Width (  cm)  0.7 cm   was 1.3    Wound Surface Area (cm^2)  0.49 cm^2    Margins  Attached edges (approximated)    Drainage Amount  Minimal    Drainage Description  Serous    Treatment  Cleansed    Selective Debridement - Location  Rt ankle and Lt forefoot    Selective Debridement - Tools Used  Forceps;Scalpel    Selective Debridement - Tissue Removed  no debridement needed for RT ankle.  Epiboled edges and callous removed from LT plantar wound.     Wound Therapy - Clinical Statement See below.  PT wounds continue to approximate, however, it is the therapist opinion that without unloading pt Lt plantar foot wound will not heal.  Pt given list of orthotists in the area as well as MD referral for eval and treat for orthotist.    Wound Therapy - Functional Problem List  Difficult to ambulate; pt is wallking on the side of his left foot.     Factors Delaying/Impairing Wound Healing  Altered sensation;Diabetes Mellitus;Infection - systemic/local;Multiple medical problems;Polypharmacy    Hydrotherapy Plan   Debridement;Dressing change;Patient/family education    Wound Therapy - Frequency  3X / week    Wound Therapy - Current Recommendations  PT;Other (comment)    Wound Plan  Continue with current POC.  Measure weekly.     Dressing   xeroform and silver hydrofiber on Rt malleoli wound and silver hydrofiber on Lt plantar wound; followed by profore compression bandage system to both.                PT Short Term Goals - 05/29/18 1616      PT SHORT TERM GOAL #1   Title  PT drainage to decrease to minimal on Rt ankle and LT plantar wounds to decrease cellulitis B     Time  1    Period  Weeks    Status  On-going      PT SHORT TERM GOAL #2   Title  PT to be wearing his off loading shoe     Time  1    Period  Weeks    Status  On-going      PT SHORT TERM GOAL #3   Title  Callous to be debrided from LT plantar foot wound to improve wound healing enviornment     Time  3    Period  Weeks    Status  On-going      PT SHORT TERM GOAL #4   Title  Anterior leg wounds to be healed     Time  3    Period  Weeks    Status  On-going        PT Long Term Goals - 05/29/18 1617      PT LONG TERM GOAL #1   Title  PT RT ankle wound and Lt plantar wound to be 100% granulated to prevent infection     Time  3    Period  Weeks    Status  On-going      PT LONG TERM GOAL #2   Title  PT depth of LT plantar wound to be no greater than 1.0 to demonstrate wound healing.     Time  6    Period  Weeks    Status  On-going      PT LONG TERM GOAL #3   Title  PT wound size on both the above wounds to have decreased by at least  1.5 cm diameter to demonstrate improved wound healing.     Time  6    Period  Weeks    Status  On-going      PT LONG TERM GOAL #4   Title  PT to have a compression garment and be using on a regular basis to keep LE from increasing edema which will delay wound healing.    Time  6    Period  Weeks    Status  On-going            Plan - 06/24/18 1615    Clinical  Impression Statement  PT wounds continue to approximate, however, Pt Plantar wound will never totally heal unless it gets unloaded.  Therapist explained this to pt and stated that if he did not have an appointment with an orthotist by next Friday we will D/C and return to MD.  PT iis more concern with Rt ankle pain.     Rehab Potential  Fair    PT Frequency  3x / week    PT Duration  6 weeks    PT Treatment/Interventions  Other (comment);ADLs/Self Care Home Management;Patient/family education   debridement and dressing change.    PT Next Visit Plan  continue with debridement and dressing; follow up on orthotic referral     Consulted and Agree with Plan of Care  Patient       Patient will benefit from skilled therapeutic intervention in order to improve the following deficits and impairments:  Abnormal gait, Decreased activity tolerance, Increased edema, Decreased skin integrity  Visit Diagnosis: Open wound of plantar aspect of foot, left, sequela  Open wound of right ankle with complication, subsequent encounter  Other abnormalities of gait and mobility     Problem List There are no active problems to display for this patient.   Rayetta [facility], PT CLT [phone] 06/24/2018, 4:18 PM  [place] [facility] [address] [place], [place], [address] Phone: [phone]   Fax:  [phone]  Name: Nyair Depaulo MRN: [id number] Date of Birth: 11/24/1955

## 2018-06-27 ENCOUNTER — Ambulatory Visit (INDEPENDENT_AMBULATORY_CARE_PROVIDER_SITE_OTHER): Payer: Medicare HMO

## 2018-06-27 ENCOUNTER — Encounter (INDEPENDENT_AMBULATORY_CARE_PROVIDER_SITE_OTHER): Payer: Self-pay

## 2018-06-27 ENCOUNTER — Ambulatory Visit (HOSPITAL_COMMUNITY): Payer: Medicare HMO | Attending: Internal Medicine | Admitting: Physical Therapy

## 2018-06-27 ENCOUNTER — Encounter (INDEPENDENT_AMBULATORY_CARE_PROVIDER_SITE_OTHER): Payer: Self-pay | Admitting: Orthopaedic Surgery

## 2018-06-27 ENCOUNTER — Encounter (HOSPITAL_COMMUNITY): Payer: Self-pay | Admitting: Physical Therapy

## 2018-06-27 ENCOUNTER — Ambulatory Visit (INDEPENDENT_AMBULATORY_CARE_PROVIDER_SITE_OTHER): Payer: Medicare HMO | Admitting: Orthopaedic Surgery

## 2018-06-27 VITALS — BP 193/96 | HR 69 | Wt 315.0 lb

## 2018-06-27 DIAGNOSIS — S91302S Unspecified open wound, left foot, sequela: Secondary | ICD-10-CM | POA: Diagnosis not present

## 2018-06-27 DIAGNOSIS — M25571 Pain in right ankle and joints of right foot: Secondary | ICD-10-CM | POA: Diagnosis not present

## 2018-06-27 DIAGNOSIS — S91001D Unspecified open wound, right ankle, subsequent encounter: Secondary | ICD-10-CM

## 2018-06-27 DIAGNOSIS — R2689 Other abnormalities of gait and mobility: Secondary | ICD-10-CM

## 2018-06-27 NOTE — Therapy (Signed)
[place], [place], [address] Phone: [phone]   Fax:  [phone]  Wound Care Therapy  Patient Details  Name: Danny [name] MRN: [id number] Date of Birth: 1956/01/29 Referring Provider (PT): Allyn Kenner  Progress Note Reporting Period 05/17/2018 to 06/27/2018  See note below for Objective Data and Assessment of Progress/Goals.       Encounter Date: 06/27/2018  PT End of Session - 06/27/18 1629    Visit Number  20   20th progress note done    Number of Visits  29    Date for PT Re-Evaluation  07/29/18    Authorization Type  [employer]; recert sent 72/09    Authorization Time Period  12/23 thr 07/29/2018    Authorization - Visit Number  29    Authorization - Number of Visits  29    PT Start Time  1520    PT Stop Time  1610    PT Time Calculation (min)  50 min    Activity Tolerance  Patient tolerated treatment well    Behavior During Therapy  [facility] for tasks assessed/performed       Past Medical History:  Diagnosis Date  . Arthritis   . Diabetes mellitus without complication ([place])   . Foot ulcer ([place])   . Hypertension     Past Surgical History:  Procedure Laterality Date  . ANKLE SURGERY    . CHOLECYSTECTOMY    . FOOT SURGERY      There were no vitals filed for this visit.              Wound Therapy - 06/27/18 1616    Subjective  PT states that he made his appointment at [facility]     Patient and Family Stated Goals  Wounds to finally heal     Date of Onset  04/26/16   approximate   Prior Treatments  wound center for 11 months.     Pain Scale  0-10    Evaluation and Treatment Procedures Explained to Patient/Family  Yes    Evaluation and Treatment Procedures  agreed to    Wound Properties Date First Assessed: 04/26/18 Time First Assessed: 0917 Wound Type: Diabetic ulcer Location: Foot Wound Description (Comments): plantar aspect  Present on Admission: Yes   Dressing Type  Silver  hydrofiber;Gauze (Comment);Compression wrap    Dressing Changed  Changed    Dressing Status  Old drainage    Dressing Change Frequency  PRN    Site / Wound Assessment  Red    % Wound base Red or Granulating  100%   was 95%   % Wound base Yellow/Fibrinous Exudate  0%    % Wound base Other/Granulation Tissue (Comment)  --   callous perimeter   Peri-wound Assessment  Edema;Other (Comment)   calloused   Wound Length (cm)  1.2 cm   was 2.5 on 10th visit   Wound Width (cm)  2.2 cm   was 3.0 on 10th visit    Wound Depth (cm)  0.4 cm   was 1.6   Wound Volume (cm^3)  1.06 cm^3    Wound Surface Area (cm^2)  2.64 cm^2    Margins  Epibole (rolled edges)    Drainage Amount  Minimal    Drainage Description  Serous    Treatment  Cleansed;Debridement (Selective)    Wound Properties Date First Assessed: 04/26/18 Time First Assessed: 4709 Wound Type: Other (Comment) Location: Ankle Location Orientation: Right;Lateral Present on  Admission: Yes   Dressing Type  Impregnated gauze (bismuth);Gauze (Comment);Compression wrap    Dressing Status  Clean;Dry;Intact    Dressing Change Frequency  PRN    Site / Wound Assessment  Red    % Wound base Red or Granulating  100%   10th visit 90%   % Wound base Yellow/Fibrinous Exudate  0%    Peri-wound Assessment  Erythema (blanchable)    Wound Length (cm)  1 cm   was 2.5 on 10th visit    Wound Width (cm)  1.2 cm   was 3.2  on 10th visit    Wound Depth (cm)  --   was .1 on 10th visit. Currently a 2nd wount posterior .4x.5    Wound Surface Area (cm^2)  1.2 cm^2    Margins  Attached edges (approximated)    Drainage Amount  Minimal    Drainage Description  Serous    Treatment  Cleansed;Debridement (Selective)    Selective Debridement - Location  Rt ankle and Lt forefoot    Selective Debridement - Tools Used  Forceps;Scalpel    Selective Debridement - Tissue Removed  biofilm and edges from Rt ankle wound, callous, slough, devitalized tissue     Wound Therapy -  Clinical Statement  Wounds progressing with decreased in width for lateral ankle.  Selective debridement for removal of callouses to promote healing on Lt forefoot and removal of biofilm on lateral ankle.  Continued wiht the same dressings for address drainage and compression wrap to address edema with toe wrap on Lt foot.      Wound Therapy - Functional Problem List  Difficult to ambulate; pt is wallking on the side of his left foot.     Factors Delaying/Impairing Wound Healing  Altered sensation;Diabetes Mellitus;Infection - systemic/local;Multiple medical problems;Polypharmacy    Hydrotherapy Plan  Debridement;Dressing change;Patient/family education    Wound Therapy - Frequency  2X / week   was 3 x a week    Wound Therapy - Current Recommendations  PT    Wound Plan  Pt continues to approximate, however a second small wound has appeared posteriroly to the lateral wound on pt Rt ankle.  Therapist will keep an eye on this.  Pt has orthotic consult on Monday for offloading his shoe.  PT continues to benefit from skilled physical therapy to keep wounds approximating as pt wounds have been chronic in nature and this is the first time that they have been noteably decreasing in size since he has sought medical attention.      Dressing   xeroform to Rt lateral ankle wound, and silver hydrofiber Lt plantar wound; followed by profore compression bandage system.                PT Short Term Goals - 06/27/18 1630      PT SHORT TERM GOAL #1   Title  PT drainage to decrease to minimal on Rt ankle and LT plantar wounds to decrease cellulitis B     Time  1    Period  Weeks    Status  Achieved      PT SHORT TERM GOAL #2   Title  PT to be wearing his off loading shoe     Time  1    Period  Weeks    Status  On-going      PT SHORT TERM GOAL #3   Title  Callous to be debrided from LT plantar foot wound to improve wound healing enviornment  Time  3    Period  Weeks    Status  Partially Met       PT SHORT TERM GOAL #4   Title  Anterior leg wounds to be healed     Time  3    Period  Weeks    Status  Achieved        PT Long Term Goals - 06/27/18 1630      PT LONG TERM GOAL #1   Title  PT RT ankle wound and Lt plantar wound to be 100% granulated to prevent infection     Time  3    Period  Weeks    Status  Achieved      PT LONG TERM GOAL #2   Title  PT depth of LT plantar wound to be no greater than 1.0 to demonstrate wound healing.     Time  6    Period  Weeks    Status  On-going      PT LONG TERM GOAL #3   Title  PT wound size on both the above wounds to have decreased by at least 1.5 cm diameter to demonstrate improved wound healing.     Time  6    Period  Weeks    Status  Achieved      PT LONG TERM GOAL #4   Title  PT to have a compression garment and be using on a regular basis to keep LE from increasing edema which will delay wound healing.    Time  6    Period  Weeks    Status  On-going            Plan - 06/27/18 1629    Clinical Impression Statement  see above     Rehab Potential  Fair    PT Frequency  3x / week    PT Duration  6 weeks    PT Treatment/Interventions  Other (comment);ADLs/Self Care Home Management;Patient/family education   debridement and dressing change.    PT Next Visit Plan  continue with debridement and dressing;     Consulted and Agree with Plan of Care  Patient       Patient will benefit from skilled therapeutic intervention in order to improve the following deficits and impairments:  Abnormal gait, Decreased activity tolerance, Increased edema, Decreased skin integrity  Visit Diagnosis: Open wound of plantar aspect of foot, left, sequela  Open wound of right ankle with complication, subsequent encounter  Other abnormalities of gait and mobility     Problem List There are no active problems to display for this patient.   Rayetta Humphrey, [place] CLT [phone] 06/27/2018, 4:31 PM  [facility] [address] [place], [place], [address] Phone: [phone]   Fax:  [phone]  Name: Dakarri Kessinger MRN: [id number] Date of Birth: 1955-12-09

## 2018-06-27 NOTE — Progress Notes (Deleted)
Pt stated Rt ankle--pt stated injury and had surgery back in 2000 with Dr. Tamera Punt in [place], still having pain especially putting weight.   Office Visit Note   Patient: Danny [name]           Date of Birth: 1956/06/15           MRN: [id number] Visit Date: 06/27/2018              Requested by: Celene Squibb, MD [place], [place] [address] PCP: Celene Squibb, MD   Assessment & Plan: Visit Diagnoses:  1. Pain in right ankle and joints of right foot     Plan: ***  Follow-Up Instructions: No follow-ups on file.   Orders:  Orders Placed This Encounter  Procedures  . XR Ankle Complete Right   No orders of the defined types were placed in this encounter.     Procedures: No procedures performed   Clinical Data: No additional findings.   Subjective: No chief complaint on file.   HPI  Review of Systems  Constitutional: Negative.   HENT: Positive for ear pain and sinus pain.   Eyes: Negative.   Respiratory: Negative.   Cardiovascular: Negative.   Gastrointestinal: Positive for abdominal pain and constipation.  Endocrine: Negative.   Genitourinary: Negative.   Musculoskeletal: Positive for gait problem and joint swelling.  Skin: Positive for color change and wound.  Allergic/Immunologic: Negative.   Hematological: Negative.   Psychiatric/Behavioral: Negative.      Objective: Vital Signs: There were no vitals taken for this visit.  Physical Exam  Ortho Exam  Specialty Comments:  No specialty comments available.  Imaging: No results found.   PMFS History: There are no active problems to display for this patient.  Past Medical History:  Diagnosis Date  . Arthritis   . Diabetes mellitus without complication ([place])   . Foot ulcer ([place])   . Hypertension     No family history on file.  Past Surgical History:  Procedure Laterality Date  . ANKLE SURGERY    . CHOLECYSTECTOMY    . FOOT SURGERY     Social History   Occupational History  .  Not on file  Tobacco Use  . Smoking status: Never Smoker  . Smokeless tobacco: Never Used  Substance and Sexual Activity  . Alcohol use: No  . Drug use: No  . Sexual activity: Not on file

## 2018-06-27 NOTE — Progress Notes (Signed)
Office Visit Note   Patient: Danny [name]           Date of Birth: 1956/03/24           MRN: [id number] Visit Date: 06/27/2018              Requested by: Celene Squibb, MD [place], [place] [address] PCP: Celene Squibb, MD   Assessment & Plan: Visit Diagnoses:  1. Pain in right ankle and joints of right foot     Plan: Advanced osteoarthritis of right ankle joint and subtalar joints.  Long discussion regarding the diagnosis and treatment options.  Given copies of his x-rays. In Addition Mr. Olmeda has Insulin-dependent diabetes with peripheral vascular disease and neuropathy.  I think his surgical options are limited.  I do think he might do well with an ankle brace to support both the talar tibial and subtalar joints.  We will give her prescription for [facility]. Follow-Up Instructions: Return if symptoms worsen or fail to improve.   Orders:  Orders Placed This Encounter  Procedures  . XR Ankle Complete Right   No orders of the defined types were placed in this encounter.     Procedures: No procedures performed   Clinical Data: No additional findings.   Subjective: Chief Complaint  Patient presents with  . Right Ankle - Injury  Danny [name] is 63 years old and has a history of insulin-dependent diabetes mellitus since at least 2005.  He has had significant problems with swelling of both of his lower extremities and visits  the wound center at [facility] in [place] where he lives.  They wrap his ankles 3 times a week.  He has developed an ulcer about his left foot which is being treated by the same facility.  He relates having limited feeling in both of his feet.  Years ago he had  injuries to his right ankle and has developed arthritis and some deformity.  He has an issue with his balance and uses a rolling walker and relates that he does have some pain in his right ankle which prompts his office visit.  HPI  Review of Systems   Objective: Vital  Signs: BP (!) 193/96   Pulse 69   Wt (!) 315 lb (142.9 kg)   BMI 41.56 kg/m   Physical Exam Constitutional:      Appearance: He is well-developed.  Eyes:     Pupils: Pupils are equal, round, and reactive to light.  Pulmonary:     Effort: Pulmonary effort is normal.  Skin:    General: Skin is warm and dry.  Neurological:     Mental Status: He is alert and oriented to person, place, and time.  Psychiatric:        Behavior: Behavior normal.     Ortho Exam awake alert and oriented x3.  Seems a little hard of hearing.  Both lower extremities are wrapped from just below the knee to the tips of the toes.  I did not remove the dressings. the toes were slightly swollen.  There was good capillary refill but the toes were cool.  I did not unwrap his foot.  He relates he does not have any ulceration of his right foot but does on the left.  There was no calf pain.  There is a fixed inversion of the right ankle with limited motion of the ankle.  I could dorsiflex from a neutral position probably 15 to 20  degrees but could not plantarflex.  Subtalar motion was very limited.  Some pain with motion.  Films have demonstrated significant arthritis at the tibiotalar joint as well as the talar calcaneal joint.  Specialty Comments:  No specialty comments available.  Imaging: Xr Ankle Complete Right  Result Date: 06/27/2018 Limbs of the right ankle obtained in 3 projections.  There is significant degenerative arthritis of the tibiotalar joint with subchondral cysts and joint space narrowing.  There are osteophytes along the talus anteriorly and posteriorly with a large area of calcification in the retrocalcaneal region.  Prior fixation with 4 staples for an os calcis osteotomy.  There is a single screw fixing the base of the fifth metatarsal fracture.  Fracture and osteotomy appears to have healed.  There are degenerative changes at the talocalcaneal joint and minimally at the talonavicular joint significant  tilting of the talus in the ankle joint with a fixed inversion    PMFS History: There are no active problems to display for this patient.  Past Medical History:  Diagnosis Date  . Arthritis   . Diabetes mellitus without complication ([place])   . Foot ulcer ([place])   . Hypertension     History reviewed. No pertinent family history.  Past Surgical History:  Procedure Laterality Date  . ANKLE SURGERY    . CHOLECYSTECTOMY    . FOOT SURGERY     Social History   Occupational History  . Not on file  Tobacco Use  . Smoking status: Never Smoker  . Smokeless tobacco: Never Used  Substance and Sexual Activity  . Alcohol use: No  . Drug use: No  . Sexual activity: Not on file     Garald Balding, MD   Note - This record has been created using [employer].  Chart creation errors have been sought, but may not always  have been located. Such creation errors do not reflect on  the standard of medical care.

## 2018-07-01 ENCOUNTER — Ambulatory Visit (HOSPITAL_COMMUNITY): Payer: Medicare HMO | Admitting: Physical Therapy

## 2018-07-01 DIAGNOSIS — S91302S Unspecified open wound, left foot, sequela: Secondary | ICD-10-CM | POA: Diagnosis not present

## 2018-07-01 DIAGNOSIS — S91001D Unspecified open wound, right ankle, subsequent encounter: Secondary | ICD-10-CM | POA: Diagnosis not present

## 2018-07-01 DIAGNOSIS — R2689 Other abnormalities of gait and mobility: Secondary | ICD-10-CM | POA: Diagnosis not present

## 2018-07-01 NOTE — Therapy (Signed)
[place], [place], [address] Phone: [phone]   Fax:  [phone]  Wound Care Therapy  Patient Details  Name: Danny [name] MRN: [id number] Date of Birth: September 09, 1955 Referring Provider (PT): Allyn Kenner   Encounter Date: 07/01/2018  PT End of Session - 07/01/18 1738    Visit Number  21   20th progress note done    Number of Visits  29    Date for PT Re-Evaluation  07/29/18    Authorization Type  [employer]; recert sent 61/44    Authorization Time Period  12/23 thr 07/29/2018    Authorization - Visit Number  21    Authorization - Number of Visits  29    PT Start Time  1610    PT Stop Time  1700    PT Time Calculation (min)  50 min    Activity Tolerance  Patient tolerated treatment well    Behavior During Therapy  [facility] for tasks assessed/performed       Past Medical History:  Diagnosis Date  . Arthritis   . Diabetes mellitus without complication ([place])   . Foot ulcer ([place])   . Hypertension     Past Surgical History:  Procedure Laterality Date  . ANKLE SURGERY    . CHOLECYSTECTOMY    . FOOT SURGERY      There were no vitals filed for this visit.              Wound Therapy - 07/01/18 1728    Subjective  PT states he went to [facility] and they shaved down the sole of his boot and he can wear it now to displace the weight off his forefoot.      Patient and Family Stated Goals  Wounds to finally heal     Date of Onset  04/26/16   approximate   Prior Treatments  wound center for 11 months.     Evaluation and Treatment Procedures Explained to Patient/Family  Yes    Evaluation and Treatment Procedures  agreed to    Wound Properties Date First Assessed: 04/26/18 Time First Assessed: 0917 Wound Type: Diabetic ulcer Location: Foot Wound Description (Comments): plantar aspect  Present on Admission: Yes   Dressing Type  Silver hydrofiber;Gauze (Comment);Compression wrap    Dressing Changed  Changed     Dressing Status  Old drainage    Dressing Change Frequency  PRN    Site / Wound Assessment  Red    % Wound base Red or Granulating  100%   was 95%   % Wound base Yellow/Fibrinous Exudate  0%    % Wound base Other/Granulation Tissue (Comment)  --   callous perimeter   Peri-wound Assessment  Edema;Other (Comment)   calloused   Margins  Epibole (rolled edges)    Drainage Amount  Minimal    Drainage Description  Serous    Treatment  Cleansed;Debridement (Selective)    Wound Properties Date First Assessed: 04/26/18 Time First Assessed: 3154 Wound Type: Other (Comment) Location: Ankle Location Orientation: Right;Lateral Present on Admission: Yes   Dressing Type  Impregnated gauze (bismuth);Gauze (Comment);Compression wrap    Dressing Changed  Changed    Dressing Status  Clean;Dry;Intact    Dressing Change Frequency  PRN    Site / Wound Assessment  Red    % Wound base Red or Granulating  100%   10th visit 90%   % Wound base Yellow/Fibrinous Exudate  0%    Peri-wound  Assessment  Erythema (blanchable)    Margins  Attached edges (approximated)    Drainage Amount  Scant    Drainage Description  Serous    Treatment  Cleansed;Debridement (Selective)    Selective Debridement - Location  Rt ankle and Lt forefoot    Selective Debridement - Tools Used  Forceps;Scalpel    Selective Debridement - Tissue Removed  biofilm and edges from Rt ankle wound, callous, slough, devitalized tissue     Wound Therapy - Clinical Statement  wounds continue to decrase in size and drainage.  approximating well.  Pt with new year deductable and may have to decrease visits to 1X week.  Pt is to contact his insurance and let us know.  Pt is to see Dr Doran Durand in February for his Rt ankle.  Pt given measurements for compression stockings to call [place] as the ones he already had were TED hose.    Wound Therapy - Functional Problem List  Difficult to ambulate; pt is wallking on the side of his left foot.     Factors  Delaying/Impairing Wound Healing  Altered sensation;Diabetes Mellitus;Infection - systemic/local;Multiple medical problems;Polypharmacy    Hydrotherapy Plan  Debridement;Dressing change;Patient/family education    Wound Therapy - Frequency  2X / week   was 3 x a week    Wound Therapy - Current Recommendations  PT    Wound Plan  continue with current woundcare.  Follow up on compression garments and need to drop to 1X week due to financial concerns.     Dressing   xeroform to Rt lateral ankle wound, and silver hydrofiber Lt plantar wound; followed by profore compression bandage system.                PT Short Term Goals - 06/27/18 1630      PT SHORT TERM GOAL #1   Title  PT drainage to decrease to minimal on Rt ankle and LT plantar wounds to decrease cellulitis B     Time  1    Period  Weeks    Status  Achieved      PT SHORT TERM GOAL #2   Title  PT to be wearing his off loading shoe     Time  1    Period  Weeks    Status  On-going      PT SHORT TERM GOAL #3   Title  Callous to be debrided from LT plantar foot wound to improve wound healing enviornment     Time  3    Period  Weeks    Status  Partially Met      PT SHORT TERM GOAL #4   Title  Anterior leg wounds to be healed     Time  3    Period  Weeks    Status  Achieved        PT Long Term Goals - 06/27/18 1630      PT LONG TERM GOAL #1   Title  PT RT ankle wound and Lt plantar wound to be 100% granulated to prevent infection     Time  3    Period  Weeks    Status  Achieved      PT LONG TERM GOAL #2   Title  PT depth of LT plantar wound to be no greater than 1.0 to demonstrate wound healing.     Time  6    Period  Weeks    Status  On-going  PT LONG TERM GOAL #3   Title  PT wound size on both the above wounds to have decreased by at least 1.5 cm diameter to demonstrate improved wound healing.     Time  6    Period  Weeks    Status  Achieved      PT LONG TERM GOAL #4   Title  PT to have a  compression garment and be using on a regular basis to keep LE from increasing edema which will delay wound healing.    Time  6    Period  Weeks    Status  On-going              Patient will benefit from skilled therapeutic intervention in order to improve the following deficits and impairments:     Visit Diagnosis: Open wound of plantar aspect of foot, left, sequela  Open wound of right ankle with complication, subsequent encounter     Problem List There are no active problems to display for this patient.  Teena Irani, PTA/CLT [phone]  Teena Irani 07/01/2018, 5:39 PM  [facility] [address] [place], [place], [address] Phone: [phone]   Fax:  [phone]  Name: Danny [name] MRN: [id number] Date of Birth: [birth year]

## 2018-07-04 ENCOUNTER — Ambulatory Visit (HOSPITAL_COMMUNITY): Payer: Medicare HMO | Admitting: Physical Therapy

## 2018-07-04 DIAGNOSIS — S91302S Unspecified open wound, left foot, sequela: Secondary | ICD-10-CM

## 2018-07-04 DIAGNOSIS — R2689 Other abnormalities of gait and mobility: Secondary | ICD-10-CM

## 2018-07-04 DIAGNOSIS — S91001D Unspecified open wound, right ankle, subsequent encounter: Secondary | ICD-10-CM

## 2018-07-04 NOTE — Therapy (Signed)
[place], [place], [address] Phone: [phone]   Fax:  [phone]  Wound Care Therapy  Patient Details  Name: Danny [name] MRN: [id number] Date of Birth: 11-07-1955 Referring Provider (PT): Allyn Kenner   Encounter Date: 07/04/2018  PT End of Session - 07/04/18 1606    Visit Number  22   20th progress note done    Number of Visits  29    Date for PT Re-Evaluation  07/29/18    Authorization Type  [employer]; recert sent 96/75    Authorization Time Period  12/23 thr 07/29/2018    Authorization - Visit Number  19    Authorization - Number of Visits  29    PT Start Time  1426    PT Stop Time  1518    PT Time Calculation (min)  52 min    Activity Tolerance  Patient tolerated treatment well    Behavior During Therapy  [facility] for tasks assessed/performed       Past Medical History:  Diagnosis Date  . Arthritis   . Diabetes mellitus without complication ([place])   . Foot ulcer ([place])   . Hypertension     Past Surgical History:  Procedure Laterality Date  . ANKLE SURGERY    . CHOLECYSTECTOMY    . FOOT SURGERY      There were no vitals filed for this visit.              Wound Therapy - 07/04/18 1555    Subjective  pt states he was unable to get his stocking ordered; states he forgot about it.     Patient and Family Stated Goals  Wounds to finally heal     Date of Onset  04/26/16   approximate   Prior Treatments  wound center for 11 months.     Evaluation and Treatment Procedures Explained to Patient/Family  Yes    Evaluation and Treatment Procedures  agreed to    Wound Properties Date First Assessed: 04/26/18 Time First Assessed: 0917 Wound Type: Diabetic ulcer Location: Foot Wound Description (Comments): plantar aspect  Present on Admission: Yes   Dressing Type  Silver hydrofiber;Gauze (Comment);Compression wrap    Dressing Changed  Changed    Dressing Status  Old drainage    Dressing Change  Frequency  PRN    Site / Wound Assessment  Red    % Wound base Red or Granulating  100%   was 95%   % Wound base Yellow/Fibrinous Exudate  0%    % Wound base Other/Granulation Tissue (Comment)  --   callous perimeter   Peri-wound Assessment  Edema;Other (Comment)   calloused   Wound Length (cm)  1 cm    Wound Width (cm)  2 cm    Wound Depth (cm)  0.4 cm    Wound Volume (cm^3)  0.8 cm^3    Wound Surface Area (cm^2)  2 cm^2    Margins  Attached edges (approximated)    Drainage Amount  Scant    Drainage Description  Serous    Treatment  Cleansed;Debridement (Selective)    Wound Properties Date First Assessed: 04/26/18 Time First Assessed: 9163 Wound Type: Other (Comment) Location: Ankle Location Orientation: Right;Lateral Present on Admission: Yes   Dressing Type  Impregnated gauze (bismuth);Gauze (Comment);Compression wrap    Dressing Changed  Changed    Dressing Status  Clean;Dry;Intact    Dressing Change Frequency  PRN    Site / Wound  Assessment  Red    % Wound base Red or Granulating  100%   10th visit 90%   % Wound base Yellow/Fibrinous Exudate  0%    Peri-wound Assessment  Erythema (blanchable)    Wound Length (cm)  0.3 cm    Wound Width (cm)  0.6 cm    Wound Depth (cm)  0 cm    Wound Volume (cm^3)  0 cm^3    Wound Surface Area (cm^2)  0.18 cm^2    Margins  Attached edges (approximated)    Drainage Amount  Scant    Drainage Description  Serous    Treatment  Cleansed;Debridement (Selective)    Selective Debridement - Location  Rt ankle and Lt forefoot    Selective Debridement - Tools Used  Forceps;Scalpel    Selective Debridement - Tissue Removed  biofilm and edges from Rt ankle wound, callous, slough, devitalized tissue     Wound Therapy - Clinical Statement  significant improvement in RT lateral ankle and continued approximation of Lt plantar foot with reduced drainage.  Debrided additional callous from perimeter of plantar wound and very little tissue removed from lateral  ankle.  Changed dressing on plantar foot to xerform as well due to reduced drainage.  Utilized toe bandaging this session as bilateral toes with increased edema and dryness.  Moisturized well following cleansing of LE's.  Had evaluating therapist inspect wounds and agreeable to reducing to 1X week at this point.  Anticipate complete healing of Rt ankle and transitioning to compression garments.     Wound Therapy - Functional Problem List  Difficult to ambulate; pt is wallking on the side of his left foot.     Factors Delaying/Impairing Wound Healing  Altered sensation;Diabetes Mellitus;Infection - systemic/local;Multiple medical problems;Polypharmacy    Hydrotherapy Plan  Debridement;Dressing change;Patient/family education    Wound Therapy - Frequency  --   1X week   Wound Therapy - Current Recommendations  PT    Wound Plan  continue with current woundcare with reduction to 1X week.     Dressing   xeroform to Rt lateral ankle wound and Lt plantar wound; followed by profore compression bandage system and toe bandaging.                 PT Short Term Goals - 06/27/18 1630      PT SHORT TERM GOAL #1   Title  PT drainage to decrease to minimal on Rt ankle and LT plantar wounds to decrease cellulitis B     Time  1    Period  Weeks    Status  Achieved      PT SHORT TERM GOAL #2   Title  PT to be wearing his off loading shoe     Time  1    Period  Weeks    Status  On-going      PT SHORT TERM GOAL #3   Title  Callous to be debrided from LT plantar foot wound to improve wound healing enviornment     Time  3    Period  Weeks    Status  Partially Met      PT SHORT TERM GOAL #4   Title  Anterior leg wounds to be healed     Time  3    Period  Weeks    Status  Achieved        PT Long Term Goals - 06/27/18 1630      PT LONG TERM GOAL #1  Title  PT RT ankle wound and Lt plantar wound to be 100% granulated to prevent infection     Time  3    Period  Weeks    Status  Achieved       PT LONG TERM GOAL #2   Title  PT depth of LT plantar wound to be no greater than 1.0 to demonstrate wound healing.     Time  6    Period  Weeks    Status  On-going      PT LONG TERM GOAL #3   Title  PT wound size on both the above wounds to have decreased by at least 1.5 cm diameter to demonstrate improved wound healing.     Time  6    Period  Weeks    Status  Achieved      PT LONG TERM GOAL #4   Title  PT to have a compression garment and be using on a regular basis to keep LE from increasing edema which will delay wound healing.    Time  6    Period  Weeks    Status  On-going              Patient will benefit from skilled therapeutic intervention in order to improve the following deficits and impairments:     Visit Diagnosis: Open wound of plantar aspect of foot, left, sequela  Open wound of right ankle with complication, subsequent encounter  Other abnormalities of gait and mobility     Problem List There are no active problems to display for this patient.  Teena Irani, PTA/CLT [phone]  Teena Irani 07/04/2018, 4:07 PM  [facility] [address] [place], [place], [address] Phone: [phone]   Fax:  [phone]  Name: Danny [name] MRN: [id number] Date of Birth: 01-19-1956

## 2018-07-08 ENCOUNTER — Ambulatory Visit (HOSPITAL_COMMUNITY): Payer: Medicare HMO

## 2018-07-11 ENCOUNTER — Ambulatory Visit (HOSPITAL_COMMUNITY): Payer: Medicare HMO | Admitting: Physical Therapy

## 2018-07-11 DIAGNOSIS — R2689 Other abnormalities of gait and mobility: Secondary | ICD-10-CM | POA: Diagnosis not present

## 2018-07-11 DIAGNOSIS — S91302S Unspecified open wound, left foot, sequela: Secondary | ICD-10-CM

## 2018-07-11 DIAGNOSIS — S91001D Unspecified open wound, right ankle, subsequent encounter: Secondary | ICD-10-CM | POA: Diagnosis not present

## 2018-07-11 NOTE — Therapy (Signed)
[place], [place], [address] Phone: [phone]   Fax:  [phone]  Wound Care Therapy  Patient Details  Name: Danny [name] MRN: [id number] Date of Birth: [birth year] Referring Provider (PT): Allyn Kenner   Encounter Date: 07/11/2018  PT End of Session - 07/11/18 1245    Visit Number  23   20th progress note done    Number of Visits  29    Date for PT Re-Evaluation  07/29/18    Authorization Type  [employer]; recert sent 32/20    Authorization Time Period  12/23 thr 07/29/2018    Authorization - Visit Number  23    Authorization - Number of Visits  29    PT Start Time  1116    PT Stop Time  1202    PT Time Calculation (min)  46 min    Activity Tolerance  Patient tolerated treatment well    Behavior During Therapy  [facility] for tasks assessed/performed       Past Medical History:  Diagnosis Date  . Arthritis   . Diabetes mellitus without complication ([place])   . Foot ulcer ([place])   . Hypertension     Past Surgical History:  Procedure Laterality Date  . ANKLE SURGERY    . CHOLECYSTECTOMY    . FOOT SURGERY      There were no vitals filed for this visit.              Wound Therapy - 07/11/18 1239    Subjective  pt comes today with stocking for Rt LE.  States no issues, doing well    Patient and Family Stated Goals  Wounds to finally heal     Date of Onset  04/26/16   approximate   Prior Treatments  wound center for 11 months.     Evaluation and Treatment Procedures Explained to Patient/Family  Yes    Evaluation and Treatment Procedures  agreed to    Wound Properties Date First Assessed: 04/26/18 Time First Assessed: 0917 Wound Type: Diabetic ulcer Location: Foot Wound Description (Comments): plantar aspect  Present on Admission: Yes   Dressing Type  Gauze (Comment);Compression wrap;Impregnated gauze (bismuth)    Dressing Changed  Changed    Dressing Status  Old drainage    Dressing Change  Frequency  PRN    Site / Wound Assessment  Red    % Wound base Red or Granulating  100%   was 95%   % Wound base Yellow/Fibrinous Exudate  0%    % Wound base Other/Granulation Tissue (Comment)  --   callous perimeter   Peri-wound Assessment  Edema;Other (Comment)   calloused   Wound Length (cm)  0.8 cm    Wound Width (cm)  1.6 cm    Wound Depth (cm)  0.3 cm    Wound Volume (cm^3)  0.38 cm^3    Wound Surface Area (cm^2)  1.28 cm^2    Margins  Attached edges (approximated)    Drainage Amount  Scant    Drainage Description  Serous    Treatment  Cleansed;Debridement (Selective)    Wound Properties Date First Assessed: 04/26/18 Time First Assessed: 2542 Wound Type: Other (Comment) Location: Ankle Location Orientation: Right;Lateral Present on Admission: Yes Final Assessment Date: 07/11/18 Final Assessment Time: 1130   Dressing Type  Impregnated gauze (bismuth);Gauze (Comment);Compression wrap  (Pended)     Dressing Status  Clean;Dry;Intact  (Pended)     Dressing Change Frequency  PRN  (  Pended)     Site / Wound Assessment  Red  (Pended)     % Wound base Red or Granulating  100%  (Pended)    10th visit 90%   % Wound base Yellow/Fibrinous Exudate  0%  (Pended)     Peri-wound Assessment  Erythema (blanchable)  (Pended)     Margins  Attached edges (approximated)  (Pended)     Drainage Amount  None  (Pended)     Drainage Description  --  (Pended)     Selective Debridement - Location   Lt forefoot    Selective Debridement - Tools Used  Forceps;Scalpel    Selective Debridement - Tissue Removed  callous, slough, devitalized tissue     Wound Therapy - Clinical Statement  Rt ankle wound is now healed completely.  Lt plantar wound continues to approximate with additional reduction in size and depth.  Continued with xeroform dressing and profore for Lt.  Pt with diffiuclty donning stocking on Rt, however reports it will be easier to do with his gloves at home.     Wound Therapy - Functional Problem  List  Difficult to ambulate; pt is wallking on the side of his left foot.     Factors Delaying/Impairing Wound Healing  Altered sensation;Diabetes Mellitus;Infection - systemic/local;Multiple medical problems;Polypharmacy    Hydrotherapy Plan  Debridement;Dressing change;Patient/family education    Wound Therapy - Frequency  --   1X week   Wound Therapy - Current Recommendations  PT    Wound Plan  inspect how donning/doffing knee high is going on Rt.  continue woundcare for Lt plantar wound.    Dressing   xeroform to Rt lateral ankle wound and Lt plantar wound; followed by profore compression bandage system and toe bandaging.                 PT Short Term Goals - 06/27/18 1630      PT SHORT TERM GOAL #1   Title  PT drainage to decrease to minimal on Rt ankle and LT plantar wounds to decrease cellulitis B     Time  1    Period  Weeks    Status  Achieved      PT SHORT TERM GOAL #2   Title  PT to be wearing his off loading shoe     Time  1    Period  Weeks    Status  On-going      PT SHORT TERM GOAL #3   Title  Callous to be debrided from LT plantar foot wound to improve wound healing enviornment     Time  3    Period  Weeks    Status  Partially Met      PT SHORT TERM GOAL #4   Title  Anterior leg wounds to be healed     Time  3    Period  Weeks    Status  Achieved        PT Long Term Goals - 06/27/18 1630      PT LONG TERM GOAL #1   Title  PT RT ankle wound and Lt plantar wound to be 100% granulated to prevent infection     Time  3    Period  Weeks    Status  Achieved      PT LONG TERM GOAL #2   Title  PT depth of LT plantar wound to be no greater than 1.0 to demonstrate wound healing.     Time  6    Period  Weeks    Status  On-going      PT LONG TERM GOAL #3   Title  PT wound size on both the above wounds to have decreased by at least 1.5 cm diameter to demonstrate improved wound healing.     Time  6    Period  Weeks    Status  Achieved      PT LONG  TERM GOAL #4   Title  PT to have a compression garment and be using on a regular basis to keep LE from increasing edema which will delay wound healing.    Time  6    Period  Weeks    Status  On-going              Patient will benefit from skilled therapeutic intervention in order to improve the following deficits and impairments:     Visit Diagnosis: Open wound of plantar aspect of foot, left, sequela     Problem List There are no active problems to display for this patient.  Teena Irani, PTA/CLT [phone]  Teena Irani 07/11/2018, 12:46 PM  [place] [address] [place], [place], [address] Phone: [phone]   Fax:  [phone]  Name: Danny [name] MRN: [id number] Date of Birth: [birth year]

## 2018-07-16 ENCOUNTER — Ambulatory Visit (HOSPITAL_COMMUNITY): Payer: Medicare HMO | Admitting: Physical Therapy

## 2018-07-18 ENCOUNTER — Ambulatory Visit (HOSPITAL_COMMUNITY): Payer: Medicare HMO | Admitting: Physical Therapy

## 2018-07-18 DIAGNOSIS — S91001D Unspecified open wound, right ankle, subsequent encounter: Secondary | ICD-10-CM

## 2018-07-18 DIAGNOSIS — R2689 Other abnormalities of gait and mobility: Secondary | ICD-10-CM | POA: Diagnosis not present

## 2018-07-18 DIAGNOSIS — S91302S Unspecified open wound, left foot, sequela: Secondary | ICD-10-CM

## 2018-07-18 NOTE — Therapy (Signed)
[place], [place], [address] Phone: [phone]   Fax:  [phone]  Wound Care Therapy  Patient Details  Name: Danny [name] MRN: [id number] Date of Birth: Sep 19, 1955 Referring Provider (PT): Allyn Kenner   Encounter Date: 07/18/2018  PT End of Session - 07/18/18 1244    Visit Number  24   20th progress note done    Number of Visits  29    Date for PT Re-Evaluation  07/29/18    Authorization Type  [employer]; recert sent 65/46    Authorization Time Period  12/23 thr 07/29/2018    Authorization - Visit Number  24    Authorization - Number of Visits  29    PT Start Time  1030    PT Stop Time  1130    PT Time Calculation (min)  60 min    Activity Tolerance  Patient tolerated treatment well    Behavior During Therapy  [facility] for tasks assessed/performed       Past Medical History:  Diagnosis Date  . Arthritis   . Diabetes mellitus without complication ([place])   . Foot ulcer ([place])   . Hypertension     Past Surgical History:  Procedure Laterality Date  . ANKLE SURGERY    . CHOLECYSTECTOMY    . FOOT SURGERY      There were no vitals filed for this visit.              Wound Therapy - 07/18/18 1235    Subjective  PT states he does not know what happened but the Rt wound broke open again.  Pt staes that he has kept the compression sock on for one and a half days.    Patient and Family Stated Goals  Wounds to finally heal     Date of Onset  04/26/16   approximate   Prior Treatments  wound center for 11 months.     Evaluation and Treatment Procedures Explained to Patient/Family  Yes    Evaluation and Treatment Procedures  agreed to    Wound Properties Date First Assessed: 07/18/18 Time First Assessed: 5035 Wound Type: Degloving Location: Ankle Location Orientation: Right;Lateral Present on Admission: No , was present recently healed     Dressing Type  Pressure dressing    Dressing Changed  Changed     Dressing Status  Old drainage    Dressing Change Frequency  PRN    Site / Wound Assessment  Clean;Granulation tissue    % Wound base Red or Granulating  100%    Peri-wound Assessment  Edema;Erythema (blanchable);Maceration    Wound Length (cm)  2.3 cm    Wound Width (cm)  3.2 cm    Wound Depth (cm)  0.2 cm    Wound Volume (cm^3)  1.47 cm^3    Wound Surface Area (cm^2)  7.36 cm^2    Drainage Amount  Moderate    Drainage Description  Serous    Treatment  Cleansed;Other (Comment)   compression dressing    Wound Properties Date First Assessed: 04/26/18 Time First Assessed: 0917 Wound Type: Diabetic ulcer Location: Foot Wound Description (Comments): plantar aspect  Present on Admission: Yes   Dressing Type  Gauze (Comment);Compression wrap;Impregnated gauze (bismuth)    Dressing Changed  Changed    Dressing Status  Old drainage    Dressing Change Frequency  PRN    Site / Wound Assessment  Red    % Wound base Red or  Granulating  100%   was 95%   % Wound base Yellow/Fibrinous Exudate  0%    % Wound base Other/Granulation Tissue (Comment)  --   callous perimeter   Peri-wound Assessment  Edema;Other (Comment)   calloused   Wound Length (cm)  0.8 cm    Wound Width (cm)  1.4 cm    Wound Depth (cm)  0.3 cm    Wound Volume (cm^3)  0.34 cm^3    Wound Surface Area (cm^2)  1.12 cm^2    Margins  Epibole (rolled edges)    Drainage Amount  Scant    Drainage Description  Serous    Treatment  Cleansed;Debridement (Selective)    Selective Debridement - Location   Lt forefoot    Selective Debridement - Tools Used  Forceps;Scalpel    Selective Debridement - Tissue Removed  epiboled edges, callous, slough, devitalized tissue     Wound Therapy - Clinical Statement  RT ankle wound has returned pt states he is not sure what happened.  Pt has compression sock on that is digging into his ankle.  Therapist questioned pt if he notice it pt stated he had but did not know what to do so did nothing.  Pt Rt LE  is bright red circumferentially, not fever or pain.  Therapist explained if either of the two began he should get in touch with his MD immediately Pt vocalized understanding.  Returned to profore to decrease swelling and irritation.  Explained to pt that once we stop using the profore he is to cleanse and moisturized  and inspect is LE everyday.     Wound Therapy - Functional Problem List  Difficult to ambulate; pt is wallking on the side of his left foot.     Factors Delaying/Impairing Wound Healing  Altered sensation;Diabetes Mellitus;Infection - systemic/local;Multiple medical problems;Polypharmacy    Hydrotherapy Plan  Debridement;Dressing change;Patient/family education    Wound Therapy - Frequency  --   1X week   Wound Therapy - Current Recommendations  PT    Wound Plan  inspect how donning/doffing knee high is going on Rt.  continue woundcare for Lt plantar wound.    Dressing   silver hydrofiber to Rt lateral ankle wound and Lt plantar wound; followed by profore compression bandage system and toe bandaging.                 PT Short Term Goals - 06/27/18 1630      PT SHORT TERM GOAL #1   Title  PT drainage to decrease to minimal on Rt ankle and LT plantar wounds to decrease cellulitis B     Time  1    Period  Weeks    Status  Achieved      PT SHORT TERM GOAL #2   Title  PT to be wearing his off loading shoe     Time  1    Period  Weeks    Status  On-going      PT SHORT TERM GOAL #3   Title  Callous to be debrided from LT plantar foot wound to improve wound healing enviornment     Time  3    Period  Weeks    Status  Partially Met      PT SHORT TERM GOAL #4   Title  Anterior leg wounds to be healed     Time  3    Period  Weeks    Status  Achieved  PT Long Term Goals - 06/27/18 1630      PT LONG TERM GOAL #1   Title  PT RT ankle wound and Lt plantar wound to be 100% granulated to prevent infection     Time  3    Period  Weeks    Status  Achieved       PT LONG TERM GOAL #2   Title  PT depth of LT plantar wound to be no greater than 1.0 to demonstrate wound healing.     Time  6    Period  Weeks    Status  On-going      PT LONG TERM GOAL #3   Title  PT wound size on both the above wounds to have decreased by at least 1.5 cm diameter to demonstrate improved wound healing.     Time  6    Period  Weeks    Status  on-going      PT LONG TERM GOAL #4   Title  PT to have a compression garment and be using on a regular basis to keep LE from increasing edema which will delay wound healing.    Time  6    Period  Weeks    Status  On-going              Patient will benefit from skilled therapeutic intervention in order to improve the following deficits and impairments:     Visit Diagnosis: Open wound of plantar aspect of foot, left, sequela  Open wound of right ankle with complication, subsequent encounter  Other abnormalities of gait and mobility     Problem List There are no active problems to display for this patient.  Rayetta Humphrey, PT CLT [phone] 07/18/2018, 12:45 PM  [place] [address] [place], [place], [address] Phone: [phone]   Fax:  [phone]  Name: Danny [name] MRN: [id number] Date of Birth: [birth year]

## 2018-07-19 DIAGNOSIS — E1165 Type 2 diabetes mellitus with hyperglycemia: Secondary | ICD-10-CM | POA: Diagnosis not present

## 2018-07-19 DIAGNOSIS — I129 Hypertensive chronic kidney disease with stage 1 through stage 4 chronic kidney disease, or unspecified chronic kidney disease: Secondary | ICD-10-CM | POA: Diagnosis not present

## 2018-07-19 DIAGNOSIS — L89893 Pressure ulcer of other site, stage 3: Secondary | ICD-10-CM | POA: Diagnosis not present

## 2018-07-19 DIAGNOSIS — E11649 Type 2 diabetes mellitus with hypoglycemia without coma: Secondary | ICD-10-CM | POA: Diagnosis not present

## 2018-07-19 DIAGNOSIS — R070 Pain in throat: Secondary | ICD-10-CM | POA: Diagnosis not present

## 2018-07-19 DIAGNOSIS — I872 Venous insufficiency (chronic) (peripheral): Secondary | ICD-10-CM | POA: Diagnosis not present

## 2018-07-19 DIAGNOSIS — N189 Chronic kidney disease, unspecified: Secondary | ICD-10-CM | POA: Diagnosis not present

## 2018-07-19 DIAGNOSIS — Z6837 Body mass index (BMI) 37.0-37.9, adult: Secondary | ICD-10-CM | POA: Diagnosis not present

## 2018-07-19 DIAGNOSIS — E782 Mixed hyperlipidemia: Secondary | ICD-10-CM | POA: Diagnosis not present

## 2018-07-19 DIAGNOSIS — L89623 Pressure ulcer of left heel, stage 3: Secondary | ICD-10-CM | POA: Diagnosis not present

## 2018-07-19 DIAGNOSIS — N184 Chronic kidney disease, stage 4 (severe): Secondary | ICD-10-CM | POA: Diagnosis not present

## 2018-07-22 ENCOUNTER — Ambulatory Visit (HOSPITAL_COMMUNITY): Payer: Medicare HMO | Admitting: Physical Therapy

## 2018-07-25 ENCOUNTER — Ambulatory Visit (HOSPITAL_COMMUNITY): Payer: Medicare HMO | Admitting: Physical Therapy

## 2018-07-25 DIAGNOSIS — S91001D Unspecified open wound, right ankle, subsequent encounter: Secondary | ICD-10-CM | POA: Diagnosis not present

## 2018-07-25 DIAGNOSIS — S91302S Unspecified open wound, left foot, sequela: Secondary | ICD-10-CM

## 2018-07-25 DIAGNOSIS — R2689 Other abnormalities of gait and mobility: Secondary | ICD-10-CM

## 2018-07-25 NOTE — Therapy (Signed)
[place], [place], [address] Phone: [phone]   Fax:  [phone]  Wound Care Therapy  Patient Details  Name: Danny [name] MRN: [id number] Date of Birth: Aug 14, 1955 Referring Provider (PT): Allyn Kenner   Encounter Date: 07/25/2018  PT End of Session - 07/25/18 1216    Visit Number  25   20th progress note done    Number of Visits  29    Date for PT Re-Evaluation  07/29/18    Authorization Type  [employer]; recert sent 65/46    Authorization Time Period  12/23 thr 07/29/2018    Authorization - Visit Number  25    Authorization - Number of Visits  29    PT Start Time  5035   pt was late today   PT Stop Time  1045    PT Time Calculation (min)  50 min    Activity Tolerance  Patient tolerated treatment well    Behavior During Therapy  [facility] for tasks assessed/performed       Past Medical History:  Diagnosis Date  . Arthritis   . Diabetes mellitus without complication ([place])   . Foot ulcer ([place])   . Hypertension     Past Surgical History:  Procedure Laterality Date  . ANKLE SURGERY    . CHOLECYSTECTOMY    . FOOT SURGERY      There were no vitals filed for this visit.              Wound Therapy - 07/25/18 1211    Subjective  Pt states no issues other than the wound never stays covered on plantar Lt foot unless toebandaging is used.     Patient and Family Stated Goals  Wounds to finally heal     Date of Onset  04/26/16   approximate   Prior Treatments  wound center for 11 months.     Evaluation and Treatment Procedures Explained to Patient/Family  Yes    Evaluation and Treatment Procedures  agreed to    Wound Properties Date First Assessed: 04/26/18 Time First Assessed: 0917 Wound Type: Diabetic ulcer Location: Foot Wound Description (Comments): plantar aspect  Present on Admission: Yes   Dressing Type  Gauze (Comment);Compression wrap;Impregnated gauze (bismuth)    Dressing Changed   Changed    Dressing Status  Old drainage    Dressing Change Frequency  PRN    Site / Wound Assessment  Red    % Wound base Red or Granulating  100%   was 95%   % Wound base Yellow/Fibrinous Exudate  0%    % Wound base Other/Granulation Tissue (Comment)  --   callous perimeter   Peri-wound Assessment  Edema;Other (Comment)   calloused   Wound Length (cm)  0.4 cm    Wound Width (cm)  1.3 cm    Wound Depth (cm)  0.3 cm    Wound Volume (cm^3)  0.16 cm^3    Wound Surface Area (cm^2)  0.52 cm^2    Margins  Epibole (rolled edges)    Drainage Amount  Scant    Drainage Description  Serous    Treatment  Cleansed;Debridement (Selective)    Wound Properties Date First Assessed: 07/18/18 Time First Assessed: 4656 Wound Type: Degloving Location: Ankle Location Orientation: Right;Lateral Present on Admission: No , was present recently healed     Dressing Type  Pressure dressing    Dressing Changed  Changed    Dressing Status  Old drainage  Dressing Change Frequency  PRN    Site / Wound Assessment  Clean;Granulation tissue    % Wound base Red or Granulating  100%    Peri-wound Assessment  Edema;Erythema (blanchable);Maceration    Wound Length (cm)  1.8 cm    Wound Width (cm)  3 cm    Wound Depth (cm)  0 cm    Wound Volume (cm^3)  0 cm^3    Wound Surface Area (cm^2)  5.4 cm^2    Drainage Amount  Minimal    Drainage Description  Serous    Treatment  Cleansed;Debridement (Selective)    Selective Debridement - Location  both wounds     Selective Debridement - Tools Used  Forceps;Scalpel    Selective Debridement - Tissue Removed  epiboled edges, callous, slough, devitalized tissue     Wound Therapy - Clinical Statement  Both wounds measured this session with additional approximation noted.  Debrided additional callous from periemter of plantar foot wound and dry skin from both areas.  Cleansed and moisturized well prior to redressing.  Toes were swollen today so added toe bandaging back to  bilateral feet with extra coverage for Lt plantar wound.      Wound Therapy - Functional Problem List  Difficult to ambulate; pt is wallking on the side of his left foot.     Factors Delaying/Impairing Wound Healing  Altered sensation;Diabetes Mellitus;Infection - systemic/local;Multiple medical problems;Polypharmacy    Hydrotherapy Plan  Debridement;Dressing change;Patient/family education    Wound Therapy - Frequency  --   1X week   Wound Therapy - Current Recommendations  PT    Wound Plan  continue with woundcare to both LE until healed.  Pt will need alot of traning/education moving forward on LE care and correct use of garments.      Dressing   silver hydrofiber to Rt lateral ankle wound and Lt plantar wound; followed by profore compression bandage system and toe bandaging.                 PT Short Term Goals - 06/27/18 1630      PT SHORT TERM GOAL #1   Title  PT drainage to decrease to minimal on Rt ankle and LT plantar wounds to decrease cellulitis B     Time  1    Period  Weeks    Status  Achieved      PT SHORT TERM GOAL #2   Title  PT to be wearing his off loading shoe     Time  1    Period  Weeks    Status  On-going      PT SHORT TERM GOAL #3   Title  Callous to be debrided from LT plantar foot wound to improve wound healing enviornment     Time  3    Period  Weeks    Status  Partially Met      PT SHORT TERM GOAL #4   Title  Anterior leg wounds to be healed     Time  3    Period  Weeks    Status  Achieved        PT Long Term Goals - 06/27/18 1630      PT LONG TERM GOAL #1   Title  PT RT ankle wound and Lt plantar wound to be 100% granulated to prevent infection     Time  3    Period  Weeks    Status  Achieved  PT LONG TERM GOAL #2   Title  PT depth of LT plantar wound to be no greater than 1.0 to demonstrate wound healing.     Time  6    Period  Weeks    Status  On-going      PT LONG TERM GOAL #3   Title  PT wound size on both the above  wounds to have decreased by at least 1.5 cm diameter to demonstrate improved wound healing.     Time  6    Period  Weeks    Status  Achieved      PT LONG TERM GOAL #4   Title  PT to have a compression garment and be using on a regular basis to keep LE from increasing edema which will delay wound healing.    Time  6    Period  Weeks    Status  On-going              Patient will benefit from skilled therapeutic intervention in order to improve the following deficits and impairments:     Visit Diagnosis: Open wound of plantar aspect of foot, left, sequela  Open wound of right ankle with complication, subsequent encounter  Other abnormalities of gait and mobility     Problem List There are no active problems to display for this patient.  Teena Irani, PTA/CLT [phone]  Teena Irani 07/25/2018, 12:17 PM  [place] [address] [place], [place], [address] Phone: [phone]   Fax:  [phone]  Name: Danny [name] MRN: [id number] Date of Birth: 1955-12-07

## 2018-07-29 ENCOUNTER — Ambulatory Visit (HOSPITAL_COMMUNITY): Payer: Medicare HMO | Admitting: Physical Therapy

## 2018-07-31 ENCOUNTER — Other Ambulatory Visit (HOSPITAL_COMMUNITY): Payer: Self-pay | Admitting: Nephrology

## 2018-07-31 ENCOUNTER — Other Ambulatory Visit: Payer: Self-pay | Admitting: Nephrology

## 2018-07-31 DIAGNOSIS — D649 Anemia, unspecified: Secondary | ICD-10-CM | POA: Diagnosis not present

## 2018-07-31 DIAGNOSIS — N183 Chronic kidney disease, stage 3 unspecified: Secondary | ICD-10-CM

## 2018-07-31 DIAGNOSIS — R809 Proteinuria, unspecified: Secondary | ICD-10-CM | POA: Diagnosis not present

## 2018-08-01 ENCOUNTER — Ambulatory Visit (HOSPITAL_COMMUNITY): Payer: Medicare HMO | Attending: Internal Medicine | Admitting: Physical Therapy

## 2018-08-01 DIAGNOSIS — S91302S Unspecified open wound, left foot, sequela: Secondary | ICD-10-CM | POA: Diagnosis not present

## 2018-08-01 DIAGNOSIS — S91001D Unspecified open wound, right ankle, subsequent encounter: Secondary | ICD-10-CM | POA: Diagnosis not present

## 2018-08-01 DIAGNOSIS — R2689 Other abnormalities of gait and mobility: Secondary | ICD-10-CM | POA: Diagnosis not present

## 2018-08-01 NOTE — Therapy (Signed)
[facility] [address] [place], [place], [address] Phone: [phone]   Fax:  [phone]  Physical Therapy Treatment  Patient Details  Name: Danny [name] MRN: [id number] Date of Birth: 1955-09-14 Referring Provider (PT): Allyn Kenner  Progress Note Reporting Period 06/27/2018  to 08/03/2018  See note below for Objective Data and Assessment of Progress/Goals.      Encounter Date: 08/01/2018  PT End of Session - 08/01/18 1344    Visit Number  26   20th progress note done    Number of Visits  36   Date for PT Re-Evaluation  07/29/18    Authorization Type  [employer]    Authorization Time Period  08/03/2018-09/05/2018   Authorization - Visit Number  26    Authorization - Number of Visits  36   PT Start Time  1038    PT Stop Time  1130    PT Time Calculation (min)  52 min    Activity Tolerance  Patient tolerated treatment well    Behavior During Therapy  WFL for tasks assessed/performed       Past Medical History:  Diagnosis Date  . Arthritis   . Diabetes mellitus without complication ([place])   . Foot ulcer ([place])   . Hypertension     Past Surgical History:  Procedure Laterality Date  . ANKLE SURGERY    . CHOLECYSTECTOMY    . FOOT SURGERY      There were no vitals filed for this visit.  Subjective Assessment - 08/01/18 1327    Subjective  pt without pain today, has orthopedic appt Monday for his ankle.                     Wound Therapy - 08/01/18 1328    Subjective  no pain.  going to orthopedist on Monday.    Patient and Family Stated Goals  Wounds to finally heal     Date of Onset  04/26/16   approximate   Prior Treatments  wound center for 11 months.     Evaluation and Treatment Procedures Explained to Patient/Family  Yes    Evaluation and Treatment Procedures  agreed to    Wound Properties Date First Assessed: 04/26/18 Time First Assessed: 0917 Wound Type: Diabetic ulcer Location: Foot Wound  Description (Comments): plantar aspect  Present on Admission: Yes   Dressing Type  Gauze (Comment);Compression wrap;Impregnated gauze (bismuth)    Dressing Changed  Changed    Dressing Status  Old drainage    Dressing Change Frequency  PRN    Site / Wound Assessment  Red    % Wound base Red or Granulating  100%   was 100% on 20th visit    % Wound base Yellow/Fibrinous Exudate  0%    % Wound base Other/Granulation Tissue (Comment)  --   callous perimeter   Peri-wound Assessment  Edema;Other (Comment)   calloused   Wound Length (cm)  0.4 cm   was 1.2 on 20th visit    Wound Width (cm)  1.3 cm   was 2.2cm   Wound Depth (cm)  0.3 cm   was 0.4cm   Wound Volume (cm^3)  0.16 cm^3    Wound Surface Area (cm^2)  0.52 cm^2    Margins  Epibole (rolled edges)    Drainage Amount  Scant was minimal    Drainage Description  Serous    Treatment  Cleansed;Debridement (Selective)    Wound Properties Date First Assessed:  07/18/18 Time First Assessed: 2947 Wound Type: Degloving Location: Ankle Location Orientation: Right;Lateral Present on Admission: No , was present recently healed     Dressing Type  Pressure dressing    Dressing Changed  Changed    Dressing Status  Old drainage    Dressing Change Frequency  PRN    Site / Wound Assessment  Clean;Granulation tissue    % Wound base Red or Granulating  100%    Peri-wound Assessment  Edema;Erythema (blanchable);Maceration    Wound Length (cm)  1.7 cm   20th visit was 1.0 , healed visit 22 came back visit 23 reopened at 2.3    Wound Width (cm)  2.6 cm   20th visit was 1.2,healed visit 22 came back visit 23 at 3.2    Wound Depth (cm)  0 cm   was 0.2cm at visit 23    Wound Volume (cm^3)  0 cm^3    Wound Surface Area (cm^2)  4.42 cm^2    Drainage Amount  Minimal    Drainage Description  Serous    Treatment  Cleansed;Debridement (Selective)    Selective Debridement - Location  both wounds     Selective Debridement - Tools Used  Forceps;Scalpel     Selective Debridement - Tissue Removed  epiboled edges, callous, slough, devitalized tissue     Wound Therapy - Clinical Statement  Both wounds measured this session with additional approximation noted.  Debrided additional callous from periemter of plantar foot wound and dry skin from both areas.  Cleansed and moisturized well prior to redressing.  Toes without swelling today due to addition of toe bandaging.  Extra coverage for Lt plantar wound. Pt will continue to benefit from skilled PT to maintain a healing environment to allow wounds to heal to prevent infection.   Follow up on consult from orthopedist regarding Rt ankle deformity.     Wound Therapy - Functional Problem List  Difficult to ambulate; pt is wallking on the side of his left foot.     Factors Delaying/Impairing Wound Healing  Altered sensation;Diabetes Mellitus;Infection - systemic/local;Multiple medical problems;Polypharmacy    Hydrotherapy Plan  Debridement;Dressing change;Patient/family education    Wound Therapy - Frequency  --   1X week for an additional 10 wks or until wound is healed.    Wound Therapy - Current Recommendations  PT    Wound Plan  continue with woundcare to both LE until healed.  Pt will need alot of traning/education moving forward on LE care and correct use of garments.      Dressing   silver hydrofiber to Rt lateral ankle wound and Lt plantar wound; followed by profore compression bandage system and toe bandaging.                  PT Short Term Goals - 08/01/18 1342      PT SHORT TERM GOAL #1   Title  PT drainage to decrease to minimal on Rt ankle and LT plantar wounds to decrease cellulitis B     Time  1    Period  Weeks    Status  Achieved      PT SHORT TERM GOAL #2   Title  PT to be wearing his off loading shoe     Time  1    Period  Weeks    Status  Achieved      PT SHORT TERM GOAL #3   Title  Callous to be debrided from LT plantar foot wound to improve  wound healing enviornment      Time  3    Period  Weeks    Status  Achieved      PT SHORT TERM GOAL #4   Title  Anterior leg wounds to be healed     Time  3    Period  Weeks    Status  Achieved        PT Long Term Goals - 08/01/18 1342      PT LONG TERM GOAL #1   Title  PT RT ankle wound and Lt plantar wound to be 100% granulated to prevent infection     Time  3    Period  Weeks    Status  Achieved      PT LONG TERM GOAL #2   Title  PT depth of LT plantar wound to be no greater than 1.0 to demonstrate wound healing.     Time  6    Period  Weeks    Status  Achieved      PT LONG TERM GOAL #3   Title  PT wound size on both the above wounds to have decreased by at least 1.5 cm diameter to demonstrate improved wound healing.     Time  6   Period  Weeks    Status  Achieved      PT LONG TERM GOAL #4   Title  PT to have a compression garment and be using on a regular basis to keep LE from increasing edema which will delay wound healing.    Time  6    Period  Weeks    Status  On-going     LONG TERM GOAL 3% Title:  Pt wounds to have healed as pt is not able to take care of wounds himself due to location and pt multiple  Medical problems TIme:   12 weeks Status ongoing          Patient will benefit from skilled therapeutic intervention in order to improve the following deficits and impairments:     Visit Diagnosis: Open wound of plantar aspect of foot, left, sequela  Open wound of right ankle with complication, subsequent encounter  Other abnormalities of gait and mobility     Problem List There are no active problems to display for this patient.  Teena Irani, PTA/CLT [place], PT CLT [phone] 08/01/2018, 1:45 PM  [facility] [address] [place], [place], [address] Phone: [phone]   Fax:  [phone]  Name: Danny [name] MRN: [id number] Date of Birth: 09-02-1955

## 2018-08-05 DIAGNOSIS — M25571 Pain in right ankle and joints of right foot: Secondary | ICD-10-CM | POA: Diagnosis not present

## 2018-08-05 DIAGNOSIS — M19171 Post-traumatic osteoarthritis, right ankle and foot: Secondary | ICD-10-CM | POA: Diagnosis not present

## 2018-08-08 ENCOUNTER — Ambulatory Visit (HOSPITAL_COMMUNITY): Payer: Medicare HMO | Admitting: Physical Therapy

## 2018-08-08 DIAGNOSIS — S91001D Unspecified open wound, right ankle, subsequent encounter: Secondary | ICD-10-CM | POA: Diagnosis not present

## 2018-08-08 DIAGNOSIS — R2689 Other abnormalities of gait and mobility: Secondary | ICD-10-CM

## 2018-08-08 DIAGNOSIS — S91302S Unspecified open wound, left foot, sequela: Secondary | ICD-10-CM | POA: Diagnosis not present

## 2018-08-08 NOTE — Therapy (Signed)
[facility] [address] [place], [place], [address] Phone: [phone]   Fax:  [phone]  Wound Care Therapy  Patient Details  Name: Danny [name] MRN: [id number] Date of Birth: 30-Dec-1955 Referring Provider (PT): Allyn Kenner   Encounter Date: 08/08/2018  PT End of Session - 08/08/18 1252    Visit Number  27   progress note done 26th visit    Number of Visits  36    Date for PT Re-Evaluation  07/29/18    Authorization Type  [employer] medic[employer]    Authorization Time Period  08/01/2018 thru 3/12    Authorization - Visit Number  86    Authorization - Number of Visits  36    PT Start Time  1033    PT Stop Time  1125    PT Time Calculation (min)  52 min    Activity Tolerance  Patient tolerated treatment well    Behavior During Therapy  [facility] for tasks assessed/performed       Past Medical History:  Diagnosis Date  . Arthritis   . Diabetes mellitus without complication ([place])   . Foot ulcer ([place])   . Hypertension     Past Surgical History:  Procedure Laterality Date  . ANKLE SURGERY    . CHOLECYSTECTOMY    . FOOT SURGERY      There were no vitals filed for this visit.              Wound Therapy - 08/08/18 1248    Subjective  Pt states orthopedist could not do what he wanted.  STates he suggested an ortho in [place] or he might go back to the one he seen in [place], [place].    Patient and Family Stated Goals  Wounds to finally heal     Date of Onset  04/26/16   approximate   Prior Treatments  wound center for 11 months.     Evaluation and Treatment Procedures Explained to Patient/Family  Yes    Evaluation and Treatment Procedures  agreed to    Wound Properties Date First Assessed: 04/26/18 Time First Assessed: 0917 Wound Type: Diabetic ulcer Location: Foot Wound Description (Comments): plantar aspect  Present on Admission: Yes   Dressing Type  Gauze (Comment);Compression wrap;Impregnated gauze (bismuth)    Dressing  Changed  Changed    Dressing Status  Old drainage    Dressing Change Frequency  PRN    Site / Wound Assessment  Red    % Wound base Red or Granulating  100%   was 95%   % Wound base Yellow/Fibrinous Exudate  0%    % Wound base Other/Granulation Tissue (Comment)  --   callous perimeter   Peri-wound Assessment  Edema;Other (Comment)   calloused   Wound Length (cm)  0.3 cm    Wound Width (cm)  1.2 cm    Wound Depth (cm)  0.2 cm    Wound Volume (cm^3)  0.07 cm^3    Wound Surface Area (cm^2)  0.36 cm^2    Margins  Epibole (rolled edges)    Drainage Amount  Scant    Drainage Description  Serous    Wound Properties Date First Assessed: 07/18/18 Time First Assessed: 8916 Wound Type: Degloving Location: Ankle Location Orientation: Right;Lateral Present on Admission: No , was present recently healed     Dressing Type  Pressure dressing;Impregnated gauze (bismuth)    Dressing Changed  Changed    Dressing Status  Old drainage  Dressing Change Frequency  PRN    Site / Wound Assessment  Clean;Granulation tissue    % Wound base Red or Granulating  100%    Peri-wound Assessment  Edema;Erythema (blanchable);Maceration    Wound Length (cm)  1.5 cm    Wound Width (cm)  2.2 cm    Wound Depth (cm)  0 cm    Wound Volume (cm^3)  0 cm^3    Wound Surface Area (cm^2)  3.3 cm^2    Drainage Amount  Minimal    Drainage Description  Serous    Treatment  Cleansed;Debridement (Selective)    Selective Debridement - Location  both wounds     Selective Debridement - Tools Used  Forceps;Scalpel    Selective Debridement - Tissue Removed  epiboled edges, callous, slough, devitalized tissue     Wound Therapy - Clinical Statement  both wounds with continued debridment, especially plantar Lt one to remove surrounding callous/skin to promote appropximation.  much improved with reducing size and improving granulation/integrity.  Changed to xeroform for both wounds today.    Wound Therapy - Functional Problem List   Difficult to ambulate; pt is wallking on the side of his left foot.     Factors Delaying/Impairing Wound Healing  Altered sensation;Diabetes Mellitus;Infection - systemic/local;Multiple medical problems;Polypharmacy    Hydrotherapy Plan  Debridement;Dressing change;Patient/family education    Wound Therapy - Frequency  --   1X week   Wound Therapy - Current Recommendations  PT    Wound Plan  continue with woundcare to both LE until healed.  Pt will need alot of traning/education moving forward on LE care and correct use of garments.      Dressing   both wounds:  xerforom, profore compression bandage system and toe bandaging.                 PT Short Term Goals - 08/01/18 1342      PT SHORT TERM GOAL #1   Title  PT drainage to decrease to minimal on Rt ankle and LT plantar wounds to decrease cellulitis B     Time  1    Period  Weeks    Status  Achieved      PT SHORT TERM GOAL #2   Title  PT to be wearing his off loading shoe     Time  1    Period  Weeks    Status  Achieved      PT SHORT TERM GOAL #3   Title  Callous to be debrided from LT plantar foot wound to improve wound healing enviornment     Time  3    Period  Weeks    Status  Achieved      PT SHORT TERM GOAL #4   Title  Anterior leg wounds to be healed     Time  3    Period  Weeks    Status  Achieved        PT Long Term Goals - 08/01/18 1342      PT LONG TERM GOAL #1   Title  PT RT ankle wound and Lt plantar wound to be 100% granulated to prevent infection     Time  3    Period  Weeks    Status  Achieved      PT LONG TERM GOAL #2   Title  PT depth of LT plantar wound to be no greater than 1.0 to demonstrate wound healing.     Time  6  Period  Weeks    Status  Achieved      PT LONG TERM GOAL #3   Title  PT wound size on both the above wounds to have decreased by at least 1.5 cm diameter to demonstrate improved wound healing.     Time  6    Period  Weeks    Status  Achieved      PT LONG TERM  GOAL #4   Title  PT to have a compression garment and be using on a regular basis to keep LE from increasing edema which will delay wound healing.    Time  6    Period  Weeks    Status  On-going      PT LONG TERM GOAL #5   Title  PT wounds to have healed due to patient not being able to care for wounds due to location and pt multiple medical problems.     Time  5    Period  Weeks    Status  New    Target Date  09/06/18              Patient will benefit from skilled therapeutic intervention in order to improve the following deficits and impairments:     Visit Diagnosis: Open wound of plantar aspect of foot, left, sequela  Open wound of right ankle with complication, subsequent encounter  Other abnormalities of gait and mobility     Problem List There are no active problems to display for this patient.  Teena Irani, PTA/CLT [phone]  Teena Irani 08/08/2018, 12:54 PM  [facility] [address] [place], [place], [address] Phone: [phone]   Fax:  [phone]  Name: Danny [name] MRN: [id number] Date of Birth: [birth year]

## 2018-08-14 DIAGNOSIS — E1165 Type 2 diabetes mellitus with hyperglycemia: Secondary | ICD-10-CM | POA: Diagnosis not present

## 2018-08-14 DIAGNOSIS — I129 Hypertensive chronic kidney disease with stage 1 through stage 4 chronic kidney disease, or unspecified chronic kidney disease: Secondary | ICD-10-CM | POA: Diagnosis not present

## 2018-08-14 DIAGNOSIS — E782 Mixed hyperlipidemia: Secondary | ICD-10-CM | POA: Diagnosis not present

## 2018-08-14 DIAGNOSIS — N184 Chronic kidney disease, stage 4 (severe): Secondary | ICD-10-CM | POA: Diagnosis not present

## 2018-08-14 DIAGNOSIS — E785 Hyperlipidemia, unspecified: Secondary | ICD-10-CM | POA: Diagnosis not present

## 2018-08-14 DIAGNOSIS — I10 Essential (primary) hypertension: Secondary | ICD-10-CM | POA: Diagnosis not present

## 2018-08-14 DIAGNOSIS — K219 Gastro-esophageal reflux disease without esophagitis: Secondary | ICD-10-CM | POA: Diagnosis not present

## 2018-08-15 ENCOUNTER — Ambulatory Visit (HOSPITAL_COMMUNITY): Payer: Medicare HMO | Admitting: Physical Therapy

## 2018-08-15 DIAGNOSIS — S91302S Unspecified open wound, left foot, sequela: Secondary | ICD-10-CM | POA: Diagnosis not present

## 2018-08-15 DIAGNOSIS — S91001D Unspecified open wound, right ankle, subsequent encounter: Secondary | ICD-10-CM | POA: Diagnosis not present

## 2018-08-15 DIAGNOSIS — R2689 Other abnormalities of gait and mobility: Secondary | ICD-10-CM

## 2018-08-15 NOTE — Therapy (Signed)
[facility] [place], [place], [address] Phone: [phone]   Fax:  [phone]  Wound Care Therapy  Patient Details  Name: Danny [name] MRN: [id number] Date of Birth: Dec 10, 1955 Referring Provider (PT): Allyn Kenner   Encounter Date: 08/15/2018  PT End of Session - 08/15/18 1132    Visit Number  28   progress note done 26th visit    Number of Visits  36    Date for PT Re-Evaluation  07/29/18    Authorization Type  [employer]    Authorization Time Period  08/01/2018 thru 3/12    Authorization - Visit Number  28    Authorization - Number of Visits  36    PT Start Time  1034    PT Stop Time  1120    PT Time Calculation (min)  46 min    Activity Tolerance  Patient tolerated treatment well    Behavior During Therapy  [facility] for tasks assessed/performed       Past Medical History:  Diagnosis Date  . Arthritis   . Diabetes mellitus without complication ([place])   . Foot ulcer ([place])   . Hypertension     Past Surgical History:  Procedure Laterality Date  . ANKLE SURGERY    . CHOLECYSTECTOMY    . FOOT SURGERY      There were no vitals filed for this visit.              Wound Therapy - 08/15/18 1127    Subjective  no complaints or changes noted.     Patient and Family Stated Goals  Wounds to finally heal     Date of Onset  04/26/16   approximate   Prior Treatments  wound center for 11 months.     Evaluation and Treatment Procedures Explained to Patient/Family  Yes    Evaluation and Treatment Procedures  agreed to    Wound Properties Date First Assessed: 04/26/18 Time First Assessed: 0917 Wound Type: Diabetic ulcer Location: Foot Wound Description (Comments): plantar aspect  Present on Admission: Yes   Dressing Type  Gauze (Comment);Compression wrap;Impregnated gauze (bismuth)    Dressing Changed  Changed    Dressing Status  Old drainage    Dressing Change Frequency  PRN    Site / Wound Assessment  Red    %  Wound base Red or Granulating  100%   was 95%   % Wound base Yellow/Fibrinous Exudate  0%    % Wound base Other/Granulation Tissue (Comment)  --   callous perimeter   Peri-wound Assessment  Edema;Other (Comment)   calloused   Wound Length (cm)  0.2 cm    Wound Width (cm)  0.8 cm    Wound Depth (cm)  0.1 cm    Wound Volume (cm^3)  0.02 cm^3    Wound Surface Area (cm^2)  0.16 cm^2    Margins  Epibole (rolled edges)    Drainage Amount  Scant    Drainage Description  Serous    Treatment  Cleansed;Debridement (Selective)    Wound Properties Date First Assessed: 07/18/18 Time First Assessed: 6962 Wound Type: Degloving Location: Ankle Location Orientation: Right;Lateral Present on Admission: No , was present recently healed     Dressing Type  Pressure dressing;Impregnated gauze (bismuth)    Dressing Changed  Changed    Dressing Status  Old drainage    Dressing Change Frequency  PRN    Site / Wound Assessment  Clean;Granulation tissue    %  Wound base Red or Granulating  100%    Peri-wound Assessment  Edema;Erythema (blanchable);Maceration    Wound Length (cm)  1.5 cm    Wound Width (cm)  2.2 cm    Wound Depth (cm)  0 cm    Wound Volume (cm^3)  0 cm^3    Wound Surface Area (cm^2)  3.3 cm^2    Drainage Amount  Minimal    Drainage Description  Serous    Treatment  Cleansed;Debridement (Selective)    Selective Debridement - Location  both wounds     Selective Debridement - Tools Used  Forceps;Scalpel    Selective Debridement - Tissue Removed  epiboled edges, callous, slough, devitalized tissue     Wound Therapy - Clinical Statement  both wounds remeasured this session.  No change Rt lateral ankle but significant improvment in plantar Lt foot wound.  cotinued with xeroform dressing and profore bilaterally.    Wound Therapy - Functional Problem List  Difficult to ambulate; pt is wallking on the side of his left foot.     Factors Delaying/Impairing Wound Healing  Altered sensation;Diabetes  Mellitus;Infection - systemic/local;Multiple medical problems;Polypharmacy    Hydrotherapy Plan  Debridement;Dressing change;Patient/family education    Wound Therapy - Frequency  --   1X week   Wound Therapy - Current Recommendations  PT    Wound Plan  continue with woundcare to both LE until healed.  Pt will need alot of traning/education moving forward on LE care and correct use of garments.      Dressing   both wounds:  xerforom, profore compression bandage system and toe bandaging.                 PT Short Term Goals - 08/01/18 1342      PT SHORT TERM GOAL #1   Title  PT drainage to decrease to minimal on Rt ankle and LT plantar wounds to decrease cellulitis B     Time  1    Period  Weeks    Status  Achieved      PT SHORT TERM GOAL #2   Title  PT to be wearing his off loading shoe     Time  1    Period  Weeks    Status  Achieved      PT SHORT TERM GOAL #3   Title  Callous to be debrided from LT plantar foot wound to improve wound healing enviornment     Time  3    Period  Weeks    Status  Achieved      PT SHORT TERM GOAL #4   Title  Anterior leg wounds to be healed     Time  3    Period  Weeks    Status  Achieved        PT Long Term Goals - 08/01/18 1342      PT LONG TERM GOAL #1   Title  PT RT ankle wound and Lt plantar wound to be 100% granulated to prevent infection     Time  3    Period  Weeks    Status  Achieved      PT LONG TERM GOAL #2   Title  PT depth of LT plantar wound to be no greater than 1.0 to demonstrate wound healing.     Time  6    Period  Weeks    Status  Achieved      PT LONG TERM GOAL #3   Title  PT wound size on both the above wounds to have decreased by at least 1.5 cm diameter to demonstrate improved wound healing.     Time  6    Period  Weeks    Status  Achieved      PT LONG TERM GOAL #4   Title  PT to have a compression garment and be using on a regular basis to keep LE from increasing edema which will delay wound  healing.    Time  6    Period  Weeks    Status  On-going      PT LONG TERM GOAL #5   Title  PT wounds to have healed due to patient not being able to care for wounds due to location and pt multiple medical problems.     Time  5    Period  Weeks    Status  New    Target Date  09/06/18              Patient will benefit from skilled therapeutic intervention in order to improve the following deficits and impairments:     Visit Diagnosis: Open wound of plantar aspect of foot, left, sequela  Open wound of right ankle with complication, subsequent encounter  Other abnormalities of gait and mobility     Problem List There are no active problems to display for this patient.  Teena Irani, PTA/CLT [phone]  Teena Irani 08/15/2018, 11:33 AM  [facility] [address] [place], [place], [address] Phone: [phone]   Fax:  [phone]  Name: Xavion Muscat MRN: [id number] Date of Birth: 11/03/1955

## 2018-08-19 ENCOUNTER — Ambulatory Visit (HOSPITAL_COMMUNITY): Payer: Medicare HMO

## 2018-08-21 DIAGNOSIS — E559 Vitamin D deficiency, unspecified: Secondary | ICD-10-CM | POA: Diagnosis not present

## 2018-08-21 DIAGNOSIS — Z1159 Encounter for screening for other viral diseases: Secondary | ICD-10-CM | POA: Diagnosis not present

## 2018-08-21 DIAGNOSIS — I10 Essential (primary) hypertension: Secondary | ICD-10-CM | POA: Diagnosis not present

## 2018-08-21 DIAGNOSIS — Z79899 Other long term (current) drug therapy: Secondary | ICD-10-CM | POA: Diagnosis not present

## 2018-08-21 DIAGNOSIS — R809 Proteinuria, unspecified: Secondary | ICD-10-CM | POA: Diagnosis not present

## 2018-08-21 DIAGNOSIS — D509 Iron deficiency anemia, unspecified: Secondary | ICD-10-CM | POA: Diagnosis not present

## 2018-08-21 DIAGNOSIS — N183 Chronic kidney disease, stage 3 (moderate): Secondary | ICD-10-CM | POA: Diagnosis not present

## 2018-08-21 DIAGNOSIS — D519 Vitamin B12 deficiency anemia, unspecified: Secondary | ICD-10-CM | POA: Diagnosis not present

## 2018-08-22 ENCOUNTER — Encounter (HOSPITAL_COMMUNITY): Payer: Self-pay | Admitting: Physical Therapy

## 2018-08-22 ENCOUNTER — Ambulatory Visit (HOSPITAL_COMMUNITY): Payer: Medicare HMO | Admitting: Physical Therapy

## 2018-08-22 DIAGNOSIS — S91302S Unspecified open wound, left foot, sequela: Secondary | ICD-10-CM | POA: Diagnosis not present

## 2018-08-22 DIAGNOSIS — R2689 Other abnormalities of gait and mobility: Secondary | ICD-10-CM

## 2018-08-22 DIAGNOSIS — S91001D Unspecified open wound, right ankle, subsequent encounter: Secondary | ICD-10-CM | POA: Diagnosis not present

## 2018-08-22 NOTE — Therapy (Signed)
[place], [place], [address] Phone: [phone]   Fax:  [phone]  Wound Care Therapy  Patient Details  Name: Danny [name] MRN: [id number] Date of Birth: 1955/08/16 Referring Provider (PT): Allyn Kenner   Encounter Date: 08/22/2018  PT End of Session - 08/22/18 1304    Visit Number  29   progress note done 26th visit    Number of Visits  36    Date for PT Re-Evaluation  07/29/18    Authorization Type  [employer]    Authorization Time Period  08/01/2018 thru 3/12    Authorization - Visit Number  35    Authorization - Number of Visits  36    PT Start Time  0953    PT Stop Time  1038    PT Time Calculation (min)  45 min    Activity Tolerance  Patient tolerated treatment well    Behavior During Therapy  [facility] for tasks assessed/performed       Past Medical History:  Diagnosis Date  . Arthritis   . Diabetes mellitus without complication ([place])   . Foot ulcer ([place])   . Hypertension     Past Surgical History:  Procedure Laterality Date  . ANKLE SURGERY    . CHOLECYSTECTOMY    . FOOT SURGERY      There were no vitals filed for this visit.      Wound Therapy - 08/22/18 1259    Subjective  PT nauseated but no pain in his feet.     Patient and Family Stated Goals  Wounds to finally heal     Date of Onset  04/26/16   approximate   Prior Treatments  wound center for 11 months.     Evaluation and Treatment Procedures Explained to Patient/Family  Yes    Evaluation and Treatment Procedures  agreed to    Wound Properties Date First Assessed: 04/26/18 Time First Assessed: 0917 Wound Type: Diabetic ulcer Location: Foot Wound Description (Comments): plantar aspect  Present on Admission: Yes   Dressing Type  Gauze (Comment);Compression wrap;Impregnated gauze (bismuth)    Dressing Changed  Changed    Dressing Status  Old drainage    Dressing Change Frequency  PRN    Site / Wound Assessment  Red    % Wound base  Red or Granulating  100%   was 95%   % Wound base Yellow/Fibrinous Exudate  0%    % Wound base Other/Granulation Tissue (Comment)  --   callous perimeter   Peri-wound Assessment  Edema;Other (Comment)   calloused   Wound Length (cm)  0.1 cm    Wound Width (cm)  0.8 cm    Wound Depth (cm)  0.1 cm    Wound Volume (cm^3)  0.01 cm^3    Wound Surface Area (cm^2)  0.08 cm^2    Margins  Epibole (rolled edges)    Drainage Amount  Scant    Drainage Description  Serous    Treatment  Cleansed;Debridement (Selective)    Wound Properties Date First Assessed: 07/18/18 Time First Assessed: 0175 Wound Type: Degloving Location: Ankle Location Orientation: Right;Lateral Present on Admission: No , was present recently healed     Dressing Type  Pressure dressing;Impregnated gauze (bismuth)    Dressing Changed  Changed    Dressing Status  Old drainage    Dressing Change Frequency  PRN    Site / Wound Assessment  Clean;Granulation tissue    % Wound base  Red or Granulating  100%    Peri-wound Assessment  Edema;Erythema (blanchable);Maceration    Wound Length (cm)  1.7 cm    Wound Width (cm)  1.5 cm    Wound Surface Area (cm^2)  2.55 cm^2    Drainage Amount  Minimal    Drainage Description  Serous    Selective Debridement - Location  both wounds     Selective Debridement - Tools Used  Forceps    Selective Debridement - Tissue Removed  epiboled edges, callous, slough, devitalized tissue     Wound Therapy - Clinical Statement  Wounds continue to approximate and should be small enough to discharge to self care next visit.  Therapist explained this to patient and requested that he brings in his compression garment next treatment.     Wound Therapy - Functional Problem List  Difficult to ambulate; pt is wallking on the side of his left foot.     Factors Delaying/Impairing Wound Healing  Altered sensation;Diabetes Mellitus;Infection - systemic/local;Multiple medical problems;Polypharmacy    Hydrotherapy Plan   Debridement;Dressing change;Patient/family education    Wound Therapy - Frequency  --   1X week   Wound Therapy - Current Recommendations  PT    Wound Plan  demonstrate donning and doffing techniques with pt; pt may need to purchase a donning assistve device such as a butler     Dressing   both wounds:  xerforom, profore lite compression bandage  system and toe bandaging.                 PT Short Term Goals - 08/01/18 1342      PT SHORT TERM GOAL #1   Title  PT drainage to decrease to minimal on Rt ankle and LT plantar wounds to decrease cellulitis B     Time  1    Period  Weeks    Status  Achieved      PT SHORT TERM GOAL #2   Title  PT to be wearing his off loading shoe     Time  1    Period  Weeks    Status  Achieved      PT SHORT TERM GOAL #3   Title  Callous to be debrided from LT plantar foot wound to improve wound healing enviornment     Time  3    Period  Weeks    Status  Achieved      PT SHORT TERM GOAL #4   Title  Anterior leg wounds to be healed     Time  3    Period  Weeks    Status  Achieved        PT Long Term Goals - 08/01/18 1342      PT LONG TERM GOAL #1   Title  PT RT ankle wound and Lt plantar wound to be 100% granulated to prevent infection     Time  3    Period  Weeks    Status  Achieved      PT LONG TERM GOAL #2   Title  PT depth of LT plantar wound to be no greater than 1.0 to demonstrate wound healing.     Time  6    Period  Weeks    Status  Achieved      PT LONG TERM GOAL #3   Title  PT wound size on both the above wounds to have decreased by at least 1.5 cm diameter to demonstrate improved wound healing.  Time  6    Period  Weeks    Status  Achieved      PT LONG TERM GOAL #4   Title  PT to have a compression garment and be using on a regular basis to keep LE from increasing edema which will delay wound healing.    Time  6    Period  Weeks    Status  On-going      PT LONG TERM GOAL #5   Title  PT wounds to have  healed due to patient not being able to care for wounds due to location and pt multiple medical problems.     Time  5    Period  Weeks    Status  New    Target Date  09/06/18            Plan - 08/22/18 1305    Clinical Impression Statement  see above     Rehab Potential  Fair    PT Frequency  3x / week    PT Duration  6 weeks    PT Treatment/Interventions  Other (comment);ADLs/Self Care Home Management;Patient/family education   debridement and dressing change.    PT Next Visit Plan  Possible discharge next session.      Consulted and Agree with Plan of Care  Patient       Patient will benefit from skilled therapeutic intervention in order to improve the following deficits and impairments:  Abnormal gait, Decreased activity tolerance, Increased edema, Decreased skin integrity  Visit Diagnosis: Open wound of plantar aspect of foot, left, sequela  Open wound of right ankle with complication, subsequent encounter  Other abnormalities of gait and mobility     Problem List There are no active problems to display for this patient. Rayetta Humphrey, PT CLT [phone] 08/22/2018, 1:06 PM  [facility] [address] [place], [place], [address] Phone: [phone]   Fax:  [phone]  Name: Danny [name] MRN: [id number] Date of Birth: 1956-05-30

## 2018-08-23 ENCOUNTER — Ambulatory Visit (HOSPITAL_COMMUNITY): Admission: RE | Admit: 2018-08-23 | Payer: Medicare HMO | Source: Ambulatory Visit

## 2018-08-28 DIAGNOSIS — N2581 Secondary hyperparathyroidism of renal origin: Secondary | ICD-10-CM | POA: Diagnosis not present

## 2018-08-28 DIAGNOSIS — R809 Proteinuria, unspecified: Secondary | ICD-10-CM | POA: Diagnosis not present

## 2018-08-28 DIAGNOSIS — N184 Chronic kidney disease, stage 4 (severe): Secondary | ICD-10-CM | POA: Diagnosis not present

## 2018-08-28 DIAGNOSIS — D638 Anemia in other chronic diseases classified elsewhere: Secondary | ICD-10-CM | POA: Diagnosis not present

## 2018-08-29 ENCOUNTER — Ambulatory Visit (HOSPITAL_COMMUNITY): Payer: Medicare HMO | Attending: Internal Medicine | Admitting: Physical Therapy

## 2018-08-29 DIAGNOSIS — S91302S Unspecified open wound, left foot, sequela: Secondary | ICD-10-CM | POA: Insufficient documentation

## 2018-08-29 DIAGNOSIS — S91001D Unspecified open wound, right ankle, subsequent encounter: Secondary | ICD-10-CM | POA: Insufficient documentation

## 2018-08-29 DIAGNOSIS — R2689 Other abnormalities of gait and mobility: Secondary | ICD-10-CM

## 2018-08-29 NOTE — Therapy (Signed)
[place], [place], [address] Phone: [phone]   Fax:  [phone]  Wound Care Therapy  Patient Details  Name: Danny [name] MRN: [id number] Date of Birth: [birth year] Referring Provider (PT): Allyn Kenner   Encounter Date: 08/29/2018    Past Medical History:  Diagnosis Date  . Arthritis   . Diabetes mellitus without complication ([place])   . Foot ulcer ([place])   . Hypertension     Past Surgical History:  Procedure Laterality Date  . ANKLE SURGERY    . CHOLECYSTECTOMY    . FOOT SURGERY      There were no vitals filed for this visit.              Wound Therapy - 08/29/18 1254    Subjective  pt reports doing well today.  Brought in his compression socks.    Patient and Family Stated Goals  Wounds to finally heal     Date of Onset  04/26/16   approximate   Prior Treatments  wound center for 11 months.     Evaluation and Treatment Procedures Explained to Patient/Family  Yes    Evaluation and Treatment Procedures  agreed to    Wound Properties Date First Assessed: 04/26/18 Time First Assessed: 0917 Wound Type: Diabetic ulcer Location: Foot Wound Description (Comments): plantar aspect  Present on Admission: Yes   Dressing Type  Gauze (Comment);Compression wrap;Impregnated gauze (bismuth)    Dressing Changed  Changed    Dressing Status  Old drainage    Dressing Change Frequency  PRN    Site / Wound Assessment  Red    % Wound base Red or Granulating  100%   was 95%   % Wound base Yellow/Fibrinous Exudate  0%    % Wound base Other/Granulation Tissue (Comment)  --   callous perimeter   Peri-wound Assessment  Edema;Other (Comment)   calloused   Wound Length (cm)  0.1 cm   was 0.8 cm on 1/23   Wound Width (cm)  0.8 cm   was 1.4 cm on 1/23   Wound Depth (cm)  0.1 cm   was 0.3 cm on 1/23   Wound Volume (cm^3)  0.01 cm^3    Wound Surface Area (cm^2)  0.08 cm^2    Margins  Epibole (rolled edges)    Drainage Amount  Scant    Drainage Description  Serous    Treatment  Cleansed;Debridement (Selective)    Wound Properties Date First Assessed: 07/18/18 Time First Assessed: 1047 Wound Type: Degloving Location: Ankle Location Orientation: Right;Lateral Present on Admission: No , was present recently healed     Dressing Type  Pressure dressing;Impregnated gauze (bismuth)    Dressing Changed  Changed    Dressing Status  Old drainage    Dressing Change Frequency  PRN    Site / Wound Assessment  Clean;Granulation tissue    % Wound base Red or Granulating  100%    Peri-wound Assessment  Edema;Erythema (blanchable);Maceration    Wound Length (cm)  1.3 cm   was 2.3 cm on 1/23   Wound Width (cm)  0.8 cm   was 3.2 cm on 1/23   Wound Depth (cm)  0 cm   was 0.2 cm on 1/23   Wound Volume (cm^3)  0 cm^3    Wound Surface Area (cm^2)  1.04 cm^2    Drainage Amount  Minimal    Drainage Description  Serous    Treatment  Cleansed;Debridement (Selective)  Selective Debridement - Location  both wounds     Selective Debridement - Tools Used  Forceps    Selective Debridement - Tissue Removed  epiboled edges, callous, slough, devitalized tissue     Wound Therapy - Clinical Statement  Proximal portion of Lt LE swollen as profore lite had slid down.  wounds remeasured and continue to approximate.  In light of patients physical condition and inability to provide compression without use of socks, fragile nature of skin and risk of wounds rupturing again, pt will benefit from continued care, 1X weekly to progress wounds to full closure and then educate heavily on steps to prevent further occurances, including moisturizing, daily use of compression garments, self checks and overall general care.  Pt has had wound on plantar surface of Lt foot for nearly 4 years and has been unable to heal this wound.  Skin is too fragile on Rt LE and patient would not be able to don stocking over wound without disturbing and irritating  it.  continued with xerform in both wounds and use of profore system.      Wound Therapy - Functional Problem List  Difficult to ambulate; pt is wallking on the side of his left foot.     Factors Delaying/Impairing Wound Healing  Altered sensation;Diabetes Mellitus;Infection - systemic/local;Multiple medical problems;Polypharmacy    Hydrotherapy Plan  Debridement;Dressing change;Patient/family education    Wound Therapy - Frequency  --   1X week   Wound Therapy - Current Recommendations  PT    Wound Plan  continue woundcare, 1X weekly until wounds are fully closed and pateint is educated and proficient in self care of LE's including donning/doffing compression.     Dressing   both wounds:  xerforom, profore compression bandage  system and toe bandaging.                 PT Short Term Goals - 08/01/18 1342      PT SHORT TERM GOAL #1   Title  PT drainage to decrease to minimal on Rt ankle and LT plantar wounds to decrease cellulitis B     Time  1    Period  Weeks    Status  Achieved      PT SHORT TERM GOAL #2   Title  PT to be wearing his off loading shoe     Time  1    Period  Weeks    Status  Achieved      PT SHORT TERM GOAL #3   Title  Callous to be debrided from LT plantar foot wound to improve wound healing enviornment     Time  3    Period  Weeks    Status  Achieved      PT SHORT TERM GOAL #4   Title  Anterior leg wounds to be healed     Time  3    Period  Weeks    Status  Achieved        PT Long Term Goals - 08/01/18 1342      PT LONG TERM GOAL #1   Title  PT RT ankle wound and Lt plantar wound to be 100% granulated to prevent infection     Time  3    Period  Weeks    Status  Achieved      PT LONG TERM GOAL #2   Title  PT depth of LT plantar wound to be no greater than 1.0 to demonstrate wound healing.  Time  6    Period  Weeks    Status  Achieved      PT LONG TERM GOAL #3   Title  PT wound size on both the above wounds to have decreased by at  least 1.5 cm diameter to demonstrate improved wound healing.     Time  6    Period  Weeks    Status  Achieved      PT LONG TERM GOAL #4   Title  PT to have a compression garment and be using on a regular basis to keep LE from increasing edema which will delay wound healing.    Time  6    Period  Weeks    Status  On-going      PT LONG TERM GOAL #5   Title  PT wounds to have healed due to patient not being able to care for wounds due to location and pt multiple medical problems.     Time  5    Period  Weeks    Status  New    Target Date  09/06/18              Patient will benefit from skilled therapeutic intervention in order to improve the following deficits and impairments:     Visit Diagnosis: No diagnosis found.     Problem List There are no active problems to display for this patient.  Teena Irani, PTA/CLT [phone]  Teena Irani 08/29/2018, 1:05 PM  [facility] [address] [place], [place], [address] Phone: [phone]   Fax:  [phone]  Name: Kashawn Dirr MRN: [id number] Date of Birth: 1955/12/08

## 2018-08-30 ENCOUNTER — Ambulatory Visit (HOSPITAL_COMMUNITY)
Admission: RE | Admit: 2018-08-30 | Discharge: 2018-08-30 | Disposition: A | Discharge status: Home / Self Care | Payer: Medicare HMO | Source: Ambulatory Visit | Attending: Nephrology | Admitting: Nephrology

## 2018-08-30 DIAGNOSIS — N189 Chronic kidney disease, unspecified: Secondary | ICD-10-CM | POA: Diagnosis not present

## 2018-08-30 DIAGNOSIS — N183 Chronic kidney disease, stage 3 unspecified: Secondary | ICD-10-CM

## 2018-09-05 ENCOUNTER — Ambulatory Visit (HOSPITAL_COMMUNITY): Payer: Medicare HMO | Admitting: Physical Therapy

## 2018-09-05 ENCOUNTER — Other Ambulatory Visit: Payer: Self-pay

## 2018-09-05 DIAGNOSIS — S91001D Unspecified open wound, right ankle, subsequent encounter: Secondary | ICD-10-CM | POA: Diagnosis not present

## 2018-09-05 DIAGNOSIS — S91302S Unspecified open wound, left foot, sequela: Secondary | ICD-10-CM

## 2018-09-05 DIAGNOSIS — R2689 Other abnormalities of gait and mobility: Secondary | ICD-10-CM

## 2018-09-05 NOTE — Therapy (Addendum)
[place], [place], [address] Phone: [phone]   Fax:  [phone]  Wound Care Therapy  Patient Details  Name: Danny [name] MRN: [id number] Date of Birth: June 21, 1956 Referring Provider (PT): Allyn Kenner   Encounter Date: 09/05/2018  PT End of Session - 09/05/18 1029    Visit Number  31   progress note done 26th visit    Number of Visits  36    Date for PT Re-Evaluation  09/26/18    Authorization Type  [employer] medicare    Authorization Time Period  10/27/6268 thru 3/50; recert sent 0/93    Authorization - Visit Number  54    Authorization - Number of Visits  36    PT Start Time  0822    PT Stop Time  0940    PT Time Calculation (min)  78 min    Activity Tolerance  Patient tolerated treatment well    Behavior During Therapy  [facility] for tasks assessed/performed       Past Medical History:  Diagnosis Date  . Arthritis   . Diabetes mellitus without complication ([place])   . Foot ulcer ([place])   . Hypertension     Past Surgical History:  Procedure Laterality Date  . ANKLE SURGERY    . CHOLECYSTECTOMY    . FOOT SURGERY      There were no vitals filed for this visit.   Subjective Assessment - 09/05/18 1019    Subjective  pt states his dressing got wet on his Lt foot and comes today with the dressing only intact from ankle to knee; all foot bandaging was removed.      Currently in Pain?  No/denies                Wound Therapy - 09/05/18 1020    Subjective  no pain, foot bandaging removed from Lt foot due to getting wet in shower.    Patient and Family Stated Goals  Wounds to finally heal     Date of Onset  04/26/16   approximate   Prior Treatments  wound center for 11 months.     Evaluation and Treatment Procedures Explained to Patient/Family  Yes    Evaluation and Treatment Procedures  agreed to    Wound Properties Date First Assessed: 04/26/18 Time First Assessed: 0917 Wound Type: Diabetic ulcer  Location: Foot Wound Description (Comments): plantar aspect  Present on Admission: Yes   Dressing Type  Gauze (Comment);Compression wrap;Impregnated gauze (bismuth)    Dressing Changed  Changed    Dressing Status  Old drainage    Dressing Change Frequency  PRN    Site / Wound Assessment  Red    % Wound base Red or Granulating  100%   was 95%   % Wound base Yellow/Fibrinous Exudate  0%    % Wound base Other/Granulation Tissue (Comment)  --   callous perimeter   Peri-wound Assessment  Edema;Other (Comment)   calloused   Wound Length (cm)  0.1 cm    Wound Width (cm)  0.8 cm    Wound Depth (cm)  0.1 cm    Wound Volume (cm^3)  0.01 cm^3    Wound Surface Area (cm^2)  0.08 cm^2    Margins  Epibole (rolled edges)    Drainage Amount  Scant    Drainage Description  Serous    Treatment  Cleansed;Debridement (Selective)    Wound Properties Date First Assessed: 07/18/18 Time First Assessed: 8182  Wound Type: Degloving Location: Ankle Location Orientation: Right;Lateral Present on Admission: No , was present recently healed     Dressing Type  Pressure dressing;Impregnated gauze (bismuth)    Dressing Changed  Changed    Dressing Status  Old drainage    Dressing Change Frequency  PRN    Site / Wound Assessment  Clean;Granulation tissue    % Wound base Red or Granulating  100%    Peri-wound Assessment  Edema;Erythema (blanchable);Maceration    Wound Length (cm)  0.5 cm    Wound Width (cm)  0.8 cm    Wound Depth (cm)  0 cm    Wound Volume (cm^3)  0 cm^3    Wound Surface Area (cm^2)  0.4 cm^2    Drainage Amount  Minimal    Drainage Description  Serous    Treatment  Cleansed    Wound Properties Date First Assessed: 09/05/18 Time First Assessed: 0900 Wound Type: Degloving Location: Toe (Comment  which one) Location Orientation: Left Wound Description (Comments): Lt plantar surface of 4th toe Present on Admission: No   Dressing Changed  New    Dressing Status  Intact    Dressing Change Frequency   PRN    Site / Wound Assessment  Clean;Red;Pink    % Wound base Red or Granulating  40%    % Wound base Other/Granulation Tissue (Comment)  60%    Peri-wound Assessment  Intact    Wound Length (cm)  1.2 cm    Wound Width (cm)  2 cm    Wound Surface Area (cm^2)  2.4 cm^2    Drainage Amount  Minimal    Drainage Description  Serosanguineous    Treatment  Cleansed;Debridement (Selective)    Wound Properties Date First Assessed: 09/05/18 Time First Assessed: 0900 Wound Type: Degloving Location: Toe (Comment  which one) Location Orientation: Left Wound Description (Comments): Lt plantar surface 5th toe (pinky toe)   Dressing Type  None    Dressing Changed  New    Dressing Status  Intact    Dressing Change Frequency  PRN    Site / Wound Assessment  Red    % Wound base Red or Granulating  50%    % Wound base Other/Granulation Tissue (Comment)  50%    Peri-wound Assessment  Intact    Wound Length (cm)  1.2 cm    Wound Width (cm)  2 cm    Wound Depth (cm)  0 cm    Wound Volume (cm^3)  0 cm^3    Wound Surface Area (cm^2)  2.4 cm^2    Drainage Amount  Minimal    Drainage Description  Serosanguineous    Treatment  Cleansed;Debridement (Selective)    Selective Debridement - Location  2 toe wounds, plantar Lt wound    Selective Debridement - Tools Used  Forceps    Selective Debridement - Tissue Removed  callous, slough, devitalized tissue     Wound Therapy - Clinical Statement  pt returns today with foot part removed from Lt LE due to getting foot wet in the shower.  Pt with bottlenecking, swelling into ankle and foot present due to stricture above.  Wound nearly healed bottom on Lt foot and continued approximation lateral Rt ankle.  Began training with compression sock on Lt LE as plan was to begin with Lt LE with sock and progress to Rt when wound completely healed.  Pt was unable to don sock, even with use of gloves due to weakness and arthritis in hands  and reduced trunk flexibility.  When pateint  removed sock, noticed open wound that appeared to be a blister that was abraised by sock, removing skin resulting in open wound plantar surface of 5th toe.  Therapist also noted blistered wound bottom of 4th toe as well.  Believe these came from wet dressings, blisters were worn on bottom of foot before pateint removed dressings.  Resumed treament with removal of loose skin and used xeroform and toe bandaging followed by profore dressings.  Pt instructed to purchase sock butler as he will not be able to donn without device.  Pt also instructed ot purchase cast protector to wear in shower and keep bandaging dry.  Pt also instructed to remove entire dressing if happens again.  Pt verbalized understanding.  Pt will need continued skilled care for his wounds as he is unable to care for them independently.     Wound Therapy - Functional Problem List  Difficult to ambulate; pt is wallking on the side of his left foot.     Factors Delaying/Impairing Wound Healing  Altered sensation;Diabetes Mellitus;Infection - systemic/local;Multiple medical problems;Polypharmacy    Hydrotherapy Plan  Debridement;Dressing change;Patient/family education    Wound Therapy - Frequency  --   1X week   Wound Therapy - Current Recommendations  PT    Wound Plan  continue woundcare, 1X weekly until wounds are fully closed and pateint is educated and proficient in self care of LE's including donning/doffing compression.   F/U on purchase of cast protector and sock butler.     Dressing   all wounds:  xerforom, profore compression bandage  system and toe bandaging.               PT Education - 09/05/18 1028    Education Details  worked on donning compression sock (see assessment).  Pt instructed to purchase 1. cast protector to prevent LE's from getting wet in shower 2. sock bulter    Person(s) Educated  Patient    Methods  Explanation    Comprehension  Verbalized understanding       PT Short Term Goals - 09/05/18 1030       PT SHORT TERM GOAL #1   Title  PT drainage to decrease to minimal on Rt ankle and LT plantar wounds to decrease cellulitis B     Time  1    Period  Weeks    Status  Achieved      PT SHORT TERM GOAL #2   Title  PT to be wearing his off loading shoe     Time  1    Period  Weeks    Status  Achieved      PT SHORT TERM GOAL #3   Title  Callous to be debrided from LT plantar foot wound to improve wound healing enviornment     Time  3    Period  Weeks    Status  Achieved      PT SHORT TERM GOAL #4   Title  Anterior leg wounds to be healed     Time  3    Period  Weeks    Status  On-going        PT Long Term Goals - 09/05/18 1030      PT LONG TERM GOAL #1   Title  PT RT ankle wound and Lt plantar wound to be 100% granulated to prevent infection     Time  3    Period  Weeks  Status  Achieved      PT LONG TERM GOAL #2   Title  PT depth of LT plantar wound to be no greater than 1.0 to demonstrate wound healing.     Time  6    Period  Weeks    Status  Achieved      PT LONG TERM GOAL #3   Title  PT wound size on both the above wounds to have decreased by at least 1.5 cm diameter to demonstrate improved wound healing.     Time  6    Period  Weeks    Status  Achieved      PT LONG TERM GOAL #4   Title  PT to have a compression garment and be using on a regular basis to keep LE from increasing edema which will delay wound healing.    Time  6    Period  Weeks    Status  On-going      PT LONG TERM GOAL #5   Title  PT wounds to have healed due to patient not being able to care for wounds due to location and pt multiple medical problems.     Time  5    Period  Weeks    Status  On-going              Patient will benefit from skilled therapeutic intervention in order to improve the following deficits and impairments:     Visit Diagnosis: Open wound of plantar aspect of foot, left, sequela  Open wound of right ankle with complication, subsequent encounter  Other  abnormalities of gait and mobility     Problem List There are no active problems to display for this patient.  Teena Irani, PTA/CLT [place], PT CLT [phone] 09/05/2018, 10:40 AM  [facility] [address] [place], [place], [address] Phone: [phone]   Fax:  [phone]  Name: Dandrea Widdowson MRN: [id number] Date of Birth: 02/28/1956

## 2018-09-05 NOTE — Addendum Note (Signed)
Addended by: Leeroy Cha on: 09/05/2018 03:32 PM   Modules accepted: Orders

## 2018-09-11 DIAGNOSIS — E1122 Type 2 diabetes mellitus with diabetic chronic kidney disease: Secondary | ICD-10-CM | POA: Diagnosis not present

## 2018-09-11 DIAGNOSIS — E785 Hyperlipidemia, unspecified: Secondary | ICD-10-CM | POA: Diagnosis not present

## 2018-09-11 DIAGNOSIS — I10 Essential (primary) hypertension: Secondary | ICD-10-CM | POA: Diagnosis not present

## 2018-09-11 DIAGNOSIS — E782 Mixed hyperlipidemia: Secondary | ICD-10-CM | POA: Diagnosis not present

## 2018-09-11 DIAGNOSIS — E11649 Type 2 diabetes mellitus with hypoglycemia without coma: Secondary | ICD-10-CM | POA: Diagnosis not present

## 2018-09-11 DIAGNOSIS — E1165 Type 2 diabetes mellitus with hyperglycemia: Secondary | ICD-10-CM | POA: Diagnosis not present

## 2018-09-12 ENCOUNTER — Ambulatory Visit (HOSPITAL_COMMUNITY): Payer: Medicare HMO | Admitting: Physical Therapy

## 2018-09-12 ENCOUNTER — Other Ambulatory Visit: Payer: Self-pay

## 2018-09-12 DIAGNOSIS — S91001D Unspecified open wound, right ankle, subsequent encounter: Secondary | ICD-10-CM

## 2018-09-12 DIAGNOSIS — S91302S Unspecified open wound, left foot, sequela: Secondary | ICD-10-CM | POA: Diagnosis not present

## 2018-09-12 DIAGNOSIS — R2689 Other abnormalities of gait and mobility: Secondary | ICD-10-CM | POA: Diagnosis not present

## 2018-09-12 NOTE — Therapy (Signed)
[place], [place], [address] Phone: [phone]   Fax:  [phone]  Wound Care Therapy  Patient Details  Name: [name] MRN: [id number] Date of Birth: 08-16-1955 Referring Provider (PT): Allyn Kenner   Encounter Date: 09/12/2018    Past Medical History:  Diagnosis Date  . Arthritis   . Diabetes mellitus without complication ([facility])   . Foot ulcer ([place])   . Hypertension     Past Surgical History:  Procedure Laterality Date  . ANKLE SURGERY    . CHOLECYSTECTOMY    . FOOT SURGERY      There were no vitals filed for this visit.              Wound Therapy - 09/12/18 0954    Subjective  pt comes today with sock  butler purchased from [place].  Dressings intact.     Patient and Family Stated Goals  Wounds to finally heal     Date of Onset  04/26/16   approximate   Prior Treatments  wound center for 11 months.     Evaluation and Treatment Procedures Explained to Patient/Family  Yes    Evaluation and Treatment Procedures  agreed to    Wound Properties Date First Assessed: 04/26/18 Time First Assessed: 0917 Wound Type: Diabetic ulcer Location: Foot Wound Description (Comments): plantar aspect  Present on Admission: Yes   Dressing Type  Gauze (Comment);Compression wrap;Impregnated gauze (bismuth)    Dressing Changed  Changed    Dressing Status  Old drainage    Dressing Change Frequency  PRN    Site / Wound Assessment  Red    % Wound base Red or Granulating  100%   was 95%   % Wound base Yellow/Fibrinous Exudate  0%    % Wound base Other/Granulation Tissue (Comment)  --   callous perimeter   Peri-wound Assessment  Edema;Other (Comment)   calloused   Wound Length (cm)  0.1 cm    Wound Width (cm)  0.6 cm    Wound Depth (cm)  0.1 cm    Wound Volume (cm^3)  0.01 cm^3    Wound Surface Area (cm^2)  0.06 cm^2    Margins  Epibole (rolled edges)    Drainage Amount  Scant    Drainage  Description  Serous    Treatment  Cleansed;Debridement (Selective)    Wound Properties Date First Assessed: 07/18/18 Time First Assessed: 6144 Wound Type: Degloving Location: Ankle Location Orientation: Right;Lateral Present on Admission: No , was present recently healed     Dressing Type  Pressure dressing;Impregnated gauze (bismuth)    Dressing Changed  Changed    Dressing Status  Old drainage    Dressing Change Frequency  PRN    Site / Wound Assessment  Clean;Granulation tissue    % Wound base Red or Granulating  100%    Peri-wound Assessment  Edema;Erythema (blanchable);Maceration    Wound Length (cm)  0.5 cm    Wound Width (cm)  0.6 cm    Wound Depth (cm)  0 cm    Wound Volume (cm^3)  0 cm^3    Wound Surface Area (cm^2)  0.3 cm^2    Drainage Amount  Minimal    Drainage Description  Serous    Treatment  Cleansed    Wound Properties Date First Assessed: 09/05/18 Time First Assessed: 0900 Wound Type: Degloving Location: Toe (Comment  which one) Location Orientation: Left Wound Description (Comments): Lt plantar surface of 4th  toe Present on Admission: No   Dressing Type  Impregnated gauze (bismuth)    Dressing Changed  Other (Comment)    Dressing Status  Intact    Dressing Change Frequency  PRN    Site / Wound Assessment  Clean;Red;Pink    % Wound base Red or Granulating  100%    % Wound base Other/Granulation Tissue (Comment)  0%    Peri-wound Assessment  Intact    Drainage Amount  None    Treatment  Cleansed    Wound Properties Date First Assessed: 09/05/18 Time First Assessed: 0900 Wound Type: Degloving Location: Toe (Comment  which one) Location Orientation: Left Wound Description (Comments): Lt plantar surface 5th toe (pinky toe)   Dressing Type  None    Dressing Changed  Changed    Dressing Status  Intact    Dressing Change Frequency  PRN    Site / Wound Assessment  Red    % Wound base Red or Granulating  60%    % Wound base Other/Granulation Tissue (Comment)  40%     Peri-wound Assessment  Intact    Wound Length (cm)  1 cm    Wound Width (cm)  1.8 cm    Wound Depth (cm)  0 cm    Wound Volume (cm^3)  0 cm^3    Wound Surface Area (cm^2)  1.8 cm^2    Drainage Amount  Minimal    Drainage Description  Serosanguineous    Treatment  Cleansed;Debridement (Selective)    Selective Debridement - Location  5th toe wound, plantar Lt wound    Selective Debridement - Tools Used  Forceps    Selective Debridement - Tissue Removed  callous, slough, devitalized tissue     Wound Therapy - Clinical Statement  pt returns with stockings and sock butler.  Wound healed on 4th toe and increased granlualtion on 5th toe.  Scant drainage and more approximation plantar Lt foot wound and Rt lateral malleoli wound.  Removed additional callous from perimeter of Lt plantar wound.  Contineud with profore on rt LE and began donning compression on Lt.  Pt able to demonstrate follwoing cues and instruction how to don/doff on Lt with sock bultler.     Wound Therapy - Functional Problem List  Difficult to ambulate; pt is wallking on the side of his left foot.     Factors Delaying/Impairing Wound Healing  Altered sensation;Diabetes Mellitus;Infection - systemic/local;Multiple medical problems;Polypharmacy    Hydrotherapy Plan  Debridement;Dressing change;Patient/family education    Wound Therapy - Frequency  --   1X week   Wound Therapy - Current Recommendations  PT    Wound Plan  continue woundcare, 1X weekly until wounds are fully closed and pateint is educated and proficient in self care of LE's including donning/doffing compression.   F/U compliance with donning/doffing stocking on Lt LE.     Dressing   all wounds:  xerforom, medipore    Dressing  Rt LE: profore    Dressing  Lt LE:  compression stocking, bandages on 5th toe and plantar foot              PT Education - 09/12/18 1008    Education Details  donning, doffing compression sock on Lt LE .  Educated on wear time,  moisturizing in PM after sock removal and cleaning/care of socks.  No dryer    Person(s) Educated  Patient    Methods  Explanation;Verbal cues;Tactile cues;Demonstration    Comprehension  Verbalized understanding;Returned demonstration;Verbal cues required;Tactile  cues required       PT Short Term Goals - 09/05/18 1030      PT SHORT TERM GOAL #1   Title  PT drainage to decrease to minimal on Rt ankle and LT plantar wounds to decrease cellulitis B     Time  1    Period  Weeks    Status  Achieved      PT SHORT TERM GOAL #2   Title  PT to be wearing his off loading shoe     Time  1    Period  Weeks    Status  Achieved      PT SHORT TERM GOAL #3   Title  Callous to be debrided from LT plantar foot wound to improve wound healing enviornment     Time  3    Period  Weeks    Status  Achieved      PT SHORT TERM GOAL #4   Title  Anterior leg wounds to be healed     Time  3    Period  Weeks    Status  On-going        PT Long Term Goals - 09/05/18 1030      PT LONG TERM GOAL #1   Title  PT RT ankle wound and Lt plantar wound to be 100% granulated to prevent infection     Time  3    Period  Weeks    Status  Achieved      PT LONG TERM GOAL #2   Title  PT depth of LT plantar wound to be no greater than 1.0 to demonstrate wound healing.     Time  6    Period  Weeks    Status  Achieved      PT LONG TERM GOAL #3   Title  PT wound size on both the above wounds to have decreased by at least 1.5 cm diameter to demonstrate improved wound healing.     Time  6    Period  Weeks    Status  Achieved      PT LONG TERM GOAL #4   Title  PT to have a compression garment and be using on a regular basis to keep LE from increasing edema which will delay wound healing.    Time  6    Period  Weeks    Status  On-going      PT LONG TERM GOAL #5   Title  PT wounds to have healed due to patient not being able to care for wounds due to location and pt multiple medical problems.     Time  5     Period  Weeks    Status  On-going              Patient will benefit from skilled therapeutic intervention in order to improve the following deficits and impairments:     Visit Diagnosis: Open wound of plantar aspect of foot, left, sequela  Open wound of right ankle with complication, subsequent encounter  Other abnormalities of gait and mobility     Problem List There are no active problems to display for this patient.  Teena Irani, PTA/CLT [phone]  Teena Irani 09/12/2018, 10:09 AM  [facility] [address] [place], [place], [address] Phone: [phone]   Fax:  [phone]  Name: Danny [name] MRN: [id number] Date of Birth: 10-Nov-1955

## 2018-09-18 DIAGNOSIS — I10 Essential (primary) hypertension: Secondary | ICD-10-CM | POA: Diagnosis not present

## 2018-09-18 DIAGNOSIS — I739 Peripheral vascular disease, unspecified: Secondary | ICD-10-CM | POA: Diagnosis not present

## 2018-09-18 DIAGNOSIS — L97909 Non-pressure chronic ulcer of unspecified part of unspecified lower leg with unspecified severity: Secondary | ICD-10-CM | POA: Diagnosis not present

## 2018-09-18 DIAGNOSIS — L89623 Pressure ulcer of left heel, stage 3: Secondary | ICD-10-CM | POA: Diagnosis not present

## 2018-09-18 DIAGNOSIS — Z23 Encounter for immunization: Secondary | ICD-10-CM | POA: Diagnosis not present

## 2018-09-18 DIAGNOSIS — E1165 Type 2 diabetes mellitus with hyperglycemia: Secondary | ICD-10-CM | POA: Diagnosis not present

## 2018-09-18 DIAGNOSIS — D631 Anemia in chronic kidney disease: Secondary | ICD-10-CM | POA: Diagnosis not present

## 2018-09-18 DIAGNOSIS — E782 Mixed hyperlipidemia: Secondary | ICD-10-CM | POA: Diagnosis not present

## 2018-09-18 DIAGNOSIS — N184 Chronic kidney disease, stage 4 (severe): Secondary | ICD-10-CM | POA: Diagnosis not present

## 2018-09-18 DIAGNOSIS — E1122 Type 2 diabetes mellitus with diabetic chronic kidney disease: Secondary | ICD-10-CM | POA: Diagnosis not present

## 2018-09-18 DIAGNOSIS — E11621 Type 2 diabetes mellitus with foot ulcer: Secondary | ICD-10-CM | POA: Diagnosis not present

## 2018-09-19 ENCOUNTER — Ambulatory Visit (HOSPITAL_COMMUNITY): Payer: Medicare HMO | Admitting: Physical Therapy

## 2018-09-20 ENCOUNTER — Encounter (HOSPITAL_COMMUNITY): Payer: Self-pay | Admitting: Physical Therapy

## 2018-09-20 ENCOUNTER — Ambulatory Visit (HOSPITAL_COMMUNITY): Payer: Medicare HMO | Admitting: Physical Therapy

## 2018-09-20 DIAGNOSIS — R2689 Other abnormalities of gait and mobility: Secondary | ICD-10-CM

## 2018-09-20 DIAGNOSIS — S91001D Unspecified open wound, right ankle, subsequent encounter: Secondary | ICD-10-CM | POA: Diagnosis not present

## 2018-09-20 DIAGNOSIS — S91302S Unspecified open wound, left foot, sequela: Secondary | ICD-10-CM

## 2018-09-23 DIAGNOSIS — L89892 Pressure ulcer of other site, stage 2: Secondary | ICD-10-CM | POA: Diagnosis not present

## 2018-09-23 DIAGNOSIS — I129 Hypertensive chronic kidney disease with stage 1 through stage 4 chronic kidney disease, or unspecified chronic kidney disease: Secondary | ICD-10-CM | POA: Diagnosis not present

## 2018-09-23 DIAGNOSIS — I10 Essential (primary) hypertension: Secondary | ICD-10-CM | POA: Diagnosis not present

## 2018-09-23 NOTE — Therapy (Signed)
[name], [place], [address] Phone: [phone]   Fax:  [phone]  Wound Care Therapy  Patient Details  Name: [name] MRN: [id number] Date of Birth: [birth year] Referring Provider (PT): Allyn Kenner   Encounter Date: 09/20/2018   PHYSICAL THERAPY DISCHARGE SUMMARY  Visits from Start of Care: 33  Current functional level related to goals / functional outcomes: See below   Remaining deficits: PT now has two new wounds on LT 4th and 5th toe.  Therapist urged pt to go to the MD to allow him to see  His new wounds in case MD would want to continue his antibiotics.    Education / Equipment: Place dressing on lateral aspect of his ankle prior to placing compression sock on.  The need to cleanse and inspect His feet on a daily basis.  The importance of wearing his compression on a daily basis.   Plan: Patient agrees to discharge.  Patient goals were not met. Patient is being discharged due to meeting the stated rehab goals.  ?????      Past Medical History:  Diagnosis Date  . Arthritis   . Diabetes mellitus without complication ([facility])   . Foot ulcer ([place] [name])   . Hypertension     Past Surgical History:  Procedure Laterality Date  . ANKLE SURGERY    . CHOLECYSTECTOMY    . FOOT SURGERY      There were no vitals filed for this visit.            09/20/18 1709  Subjective Assessment  Subjective Pt states that he has been using the compression sock on his Lt LE.  He has been itching it and his Leg turned red therefore he has gotton some antibiotics.    Patient and Family Stated Goals Wounds to finally heal   Date of Onset 04/26/16 (approximate)  Prior Treatments wound center for 11 months.   Evaluation and Treatment  Evaluation and Treatment Procedures Explained to Patient/Family Yes  Evaluation and Treatment Procedures agreed to  Wound / Incision (Open or Dehisced) 07/18/18 Degloving Ankle  Right;Lateral  Date First Assessed/Time First Assessed: 07/18/18 1047   Wound Type: Degloving  Location: Ankle  Location Orientation: Right;Lateral  Present on Admission: (c) No  Dressing Type Pressure dressing;Impregnated gauze (bismuth)  Dressing Status Old drainage  Dressing Change Frequency PRN  Site / Wound Assessment Clean;Granulation tissue  % Wound base Red or Granulating 100%  Peri-wound Assessment Edema;Erythema (blanchable);Maceration  Wound Length (cm) 0.2 cm was 3cm   Wound Width (cm) 0.2cm was 4 cm   Wound Surface Area (cm^2) 0.04 cm^2  Drainage Amount Minimal  Drainage Description Serous  Treatment Cleansed  Wound / Incision (Open or Dehisced) 09/05/18 Degloving Toe (Comment  which one) Left Lt plantar surface of 4th toe  Date First Assessed/Time First Assessed: 09/05/18 0900   Wound Type: Degloving  Location: Toe (Comment  which one)  Location Orientation: Left  Wound Description (Comments): Lt plantar surface of 4th toe  Present on Admission: No  Dressing Type Impregnated gauze (bismuth)  Dressing Changed Other (Comment)  Dressing Status Intact  Dressing Change Frequency PRN  Site / Wound Assessment Clean;Red;Pink  % Wound base Red or Granulating 100%   % Wound base Other/Granulation Tissue (Comment) 0%  Peri-wound Assessment Intact  Drainage Amount None  Treatment Cleansed  Wound / Incision (Open or Dehisced) 09/05/18 Degloving Toe (Comment  which one) Left Lt plantar surface 5th toe (pinky  toe)  Date First Assessed/Time First Assessed: 09/05/18 0900   Wound Type: Degloving  Location: Toe (Comment  which one)  Location Orientation: Left  Wound Description (Comments): Lt plantar surface 5th toe (pinky toe)  Dressing Type None  Dressing Status Intact  Dressing Change Frequency PRN  Site / Wound Assessment Red  % Wound base Red or Granulating 60%  % Wound base Other/Granulation Tissue (Comment) 40%  Peri-wound Assessment Intact  Drainage Amount Minimal  Drainage  Description Serosanguineous  Treatment Cleansed  Wound / Incision (Open or Dehisced) 04/26/18 Diabetic ulcer Foot plantar aspect   Date First Assessed/Time First Assessed: 04/26/18 0917   Wound Type: Diabetic ulcer  Location: Foot  Wound Description (Comments): plantar aspect   Present on Admission: Yes  Peri-wound Assessment Wound is now healed was 3.5x 3.2x 2.5 cm  (calloused)  Margins Epibole (rolled edges)  Wound Therapy - Assess/Plan/Recommendations  Wound Therapy - Clinical Statement PT wound on plantar aspect of Lt LE has healed; Rt lateral wound is now .2 cm with no drainage and no depth.  Pt MD has not seen wounds on his fourth and fith toes and currently has no order therefore patient will be discharged.  Therapist advised pt to keep an eye on the LT 5th and 4th toe wounds and if they deteriorate he should get into see his MD and have him assess.  Pt agrees to discharge and demonstrates that ability to don his compression garment with the butler.    Wound Therapy - Functional Problem List Difficult to ambulate; pt is wallking on the side of his left foot.   Factors Delaying/Impairing Wound Healing Altered sensation;Diabetes Mellitus;Infection - systemic/local;Multiple medical problems;Polypharmacy  Hydrotherapy Plan Debridement;Dressing change;Patient/family education  Wound Therapy - Frequency  (1X week)  Wound Therapy - Current Recommendations PT  Wound Plan Discharge to home care.    Wound Therapy  Dressing  all wounds:  xerforom, medipore  Dressing xeroform, gauze and tape over wound followed by compression socks.           PT Short Term Goals - 09/20/18 1333      PT SHORT TERM GOAL #1   Title  PT drainage to decrease to minimal on Rt ankle and LT plantar wounds to decrease cellulitis B     Time  1    Period  Weeks    Status  Achieved      PT SHORT TERM GOAL #2   Title  PT to be wearing his off loading shoe     Time  1    Period  Weeks    Status  Achieved       PT SHORT TERM GOAL #3   Title  Callous to be debrided from LT plantar foot wound to improve wound healing enviornment     Time  3    Period  Weeks    Status  Achieved      PT SHORT TERM GOAL #4   Title  Anterior leg wounds to be healed     Time  3    Period  Weeks    Status  Achieved        PT Long Term Goals - 09/20/18 1333      PT LONG TERM GOAL #1   Title  PT RT ankle wound and Lt plantar wound to be 100% granulated to prevent infection     Time  3    Period  Weeks    Status  Achieved  PT LONG TERM GOAL #2   Title  PT depth of LT plantar wound to be no greater than 1.0 to demonstrate wound healing.     Time  6    Period  Weeks    Status  Achieved      PT LONG TERM GOAL #3   Title  PT wound size on both the above wounds to have decreased by at least 1.5 cm diameter to demonstrate improved wound healing.     Time  6    Period  Weeks    Status  Achieved      PT LONG TERM GOAL #4   Title  PT to have a compression garment and be using on a regular basis to keep LE from increasing edema which will delay wound healing.    Time  6    Period  Weeks    Status  Achieved      PT LONG TERM GOAL #5   Title  PT wounds to have healed due to patient not being able to care for wounds due to location and pt multiple medical problems.     Time  5    Period  Weeks    Status  Partially Met   lateral wound is .1cm diameter with no drainage.  Planar wound is healed.              Patient will benefit from skilled therapeutic intervention in order to improve the following deficits and impairments:     Visit Diagnosis: Open wound of plantar aspect of foot, left, sequela  Open wound of right ankle with complication, subsequent encounter  Other abnormalities of gait and mobility     Problem List There are no active problems to display for this patient.   Rayetta Humphrey, PT CLT [phone] 09/23/2018, 1:41 PM  [facility] [address] [place], [place], [address] Phone: [phone]   Fax:  [phone]  Name: [name] MRN: [id number] Date of Birth: [birth year]

## 2018-09-24 DIAGNOSIS — E782 Mixed hyperlipidemia: Secondary | ICD-10-CM | POA: Diagnosis not present

## 2018-09-24 DIAGNOSIS — I10 Essential (primary) hypertension: Secondary | ICD-10-CM | POA: Diagnosis not present

## 2018-09-24 DIAGNOSIS — E1165 Type 2 diabetes mellitus with hyperglycemia: Secondary | ICD-10-CM | POA: Diagnosis not present

## 2018-09-24 DIAGNOSIS — K219 Gastro-esophageal reflux disease without esophagitis: Secondary | ICD-10-CM | POA: Diagnosis not present

## 2018-09-24 DIAGNOSIS — I129 Hypertensive chronic kidney disease with stage 1 through stage 4 chronic kidney disease, or unspecified chronic kidney disease: Secondary | ICD-10-CM | POA: Diagnosis not present

## 2018-09-24 DIAGNOSIS — N184 Chronic kidney disease, stage 4 (severe): Secondary | ICD-10-CM | POA: Diagnosis not present

## 2018-09-24 DIAGNOSIS — E785 Hyperlipidemia, unspecified: Secondary | ICD-10-CM | POA: Diagnosis not present

## 2018-09-26 ENCOUNTER — Ambulatory Visit (HOSPITAL_COMMUNITY): Payer: Medicare HMO | Admitting: Physical Therapy

## 2018-09-27 ENCOUNTER — Ambulatory Visit (HOSPITAL_COMMUNITY): Payer: Medicare HMO | Admitting: Physical Therapy

## 2018-10-17 DIAGNOSIS — Z0000 Encounter for general adult medical examination without abnormal findings: Secondary | ICD-10-CM | POA: Diagnosis not present

## 2018-11-11 DIAGNOSIS — J029 Acute pharyngitis, unspecified: Secondary | ICD-10-CM | POA: Diagnosis not present

## 2018-11-11 DIAGNOSIS — J019 Acute sinusitis, unspecified: Secondary | ICD-10-CM | POA: Diagnosis not present

## 2018-11-11 DIAGNOSIS — H9209 Otalgia, unspecified ear: Secondary | ICD-10-CM | POA: Diagnosis not present

## 2018-11-27 DIAGNOSIS — R42 Dizziness and giddiness: Secondary | ICD-10-CM | POA: Diagnosis not present

## 2018-11-27 DIAGNOSIS — I10 Essential (primary) hypertension: Secondary | ICD-10-CM | POA: Diagnosis not present

## 2018-11-27 DIAGNOSIS — J0101 Acute recurrent maxillary sinusitis: Secondary | ICD-10-CM | POA: Diagnosis not present

## 2018-12-05 DIAGNOSIS — R42 Dizziness and giddiness: Secondary | ICD-10-CM | POA: Diagnosis not present

## 2018-12-05 DIAGNOSIS — E11649 Type 2 diabetes mellitus with hypoglycemia without coma: Secondary | ICD-10-CM | POA: Diagnosis not present

## 2018-12-05 DIAGNOSIS — L89893 Pressure ulcer of other site, stage 3: Secondary | ICD-10-CM | POA: Diagnosis not present

## 2018-12-05 DIAGNOSIS — J0101 Acute recurrent maxillary sinusitis: Secondary | ICD-10-CM | POA: Diagnosis not present

## 2018-12-05 DIAGNOSIS — E559 Vitamin D deficiency, unspecified: Secondary | ICD-10-CM | POA: Diagnosis not present

## 2018-12-05 DIAGNOSIS — E1165 Type 2 diabetes mellitus with hyperglycemia: Secondary | ICD-10-CM | POA: Diagnosis not present

## 2018-12-05 DIAGNOSIS — R070 Pain in throat: Secondary | ICD-10-CM | POA: Diagnosis not present

## 2018-12-05 DIAGNOSIS — L97909 Non-pressure chronic ulcer of unspecified part of unspecified lower leg with unspecified severity: Secondary | ICD-10-CM | POA: Diagnosis not present

## 2018-12-05 DIAGNOSIS — Z8631 Personal history of diabetic foot ulcer: Secondary | ICD-10-CM | POA: Diagnosis not present

## 2018-12-05 DIAGNOSIS — D509 Iron deficiency anemia, unspecified: Secondary | ICD-10-CM | POA: Diagnosis not present

## 2018-12-05 DIAGNOSIS — Z0000 Encounter for general adult medical examination without abnormal findings: Secondary | ICD-10-CM | POA: Diagnosis not present

## 2018-12-05 DIAGNOSIS — I872 Venous insufficiency (chronic) (peripheral): Secondary | ICD-10-CM | POA: Diagnosis not present

## 2018-12-05 DIAGNOSIS — R531 Weakness: Secondary | ICD-10-CM | POA: Diagnosis not present

## 2018-12-05 DIAGNOSIS — I129 Hypertensive chronic kidney disease with stage 1 through stage 4 chronic kidney disease, or unspecified chronic kidney disease: Secondary | ICD-10-CM | POA: Diagnosis not present

## 2018-12-05 DIAGNOSIS — N183 Chronic kidney disease, stage 3 (moderate): Secondary | ICD-10-CM | POA: Diagnosis not present

## 2018-12-05 DIAGNOSIS — N189 Chronic kidney disease, unspecified: Secondary | ICD-10-CM | POA: Diagnosis not present

## 2018-12-05 DIAGNOSIS — D510 Vitamin B12 deficiency anemia due to intrinsic factor deficiency: Secondary | ICD-10-CM | POA: Diagnosis not present

## 2018-12-05 DIAGNOSIS — I10 Essential (primary) hypertension: Secondary | ICD-10-CM | POA: Diagnosis not present

## 2018-12-13 DIAGNOSIS — J329 Chronic sinusitis, unspecified: Secondary | ICD-10-CM | POA: Diagnosis not present

## 2018-12-13 DIAGNOSIS — R42 Dizziness and giddiness: Secondary | ICD-10-CM | POA: Diagnosis not present

## 2018-12-13 DIAGNOSIS — H9202 Otalgia, left ear: Secondary | ICD-10-CM | POA: Diagnosis not present

## 2019-01-13 ENCOUNTER — Ambulatory Visit (INDEPENDENT_AMBULATORY_CARE_PROVIDER_SITE_OTHER): Payer: Medicare HMO | Admitting: Otolaryngology

## 2019-01-13 ENCOUNTER — Other Ambulatory Visit: Payer: Self-pay

## 2019-01-13 DIAGNOSIS — H903 Sensorineural hearing loss, bilateral: Secondary | ICD-10-CM

## 2019-01-13 DIAGNOSIS — J343 Hypertrophy of nasal turbinates: Secondary | ICD-10-CM | POA: Diagnosis not present

## 2019-01-13 DIAGNOSIS — H6983 Other specified disorders of Eustachian tube, bilateral: Secondary | ICD-10-CM | POA: Diagnosis not present

## 2019-01-13 DIAGNOSIS — J342 Deviated nasal septum: Secondary | ICD-10-CM

## 2019-01-16 DIAGNOSIS — L97909 Non-pressure chronic ulcer of unspecified part of unspecified lower leg with unspecified severity: Secondary | ICD-10-CM | POA: Diagnosis not present

## 2019-01-16 DIAGNOSIS — J329 Chronic sinusitis, unspecified: Secondary | ICD-10-CM | POA: Diagnosis not present

## 2019-01-16 DIAGNOSIS — E559 Vitamin D deficiency, unspecified: Secondary | ICD-10-CM | POA: Diagnosis not present

## 2019-01-16 DIAGNOSIS — E11649 Type 2 diabetes mellitus with hypoglycemia without coma: Secondary | ICD-10-CM | POA: Diagnosis not present

## 2019-01-16 DIAGNOSIS — R070 Pain in throat: Secondary | ICD-10-CM | POA: Diagnosis not present

## 2019-01-16 DIAGNOSIS — I129 Hypertensive chronic kidney disease with stage 1 through stage 4 chronic kidney disease, or unspecified chronic kidney disease: Secondary | ICD-10-CM | POA: Diagnosis not present

## 2019-01-16 DIAGNOSIS — E1165 Type 2 diabetes mellitus with hyperglycemia: Secondary | ICD-10-CM | POA: Diagnosis not present

## 2019-01-16 DIAGNOSIS — L89893 Pressure ulcer of other site, stage 3: Secondary | ICD-10-CM | POA: Diagnosis not present

## 2019-01-16 DIAGNOSIS — I872 Venous insufficiency (chronic) (peripheral): Secondary | ICD-10-CM | POA: Diagnosis not present

## 2019-01-17 ENCOUNTER — Other Ambulatory Visit: Payer: Self-pay | Admitting: Otolaryngology

## 2019-01-17 DIAGNOSIS — J329 Chronic sinusitis, unspecified: Secondary | ICD-10-CM

## 2019-01-27 DIAGNOSIS — I129 Hypertensive chronic kidney disease with stage 1 through stage 4 chronic kidney disease, or unspecified chronic kidney disease: Secondary | ICD-10-CM | POA: Diagnosis not present

## 2019-01-27 DIAGNOSIS — E1165 Type 2 diabetes mellitus with hyperglycemia: Secondary | ICD-10-CM | POA: Diagnosis not present

## 2019-01-27 DIAGNOSIS — L409 Psoriasis, unspecified: Secondary | ICD-10-CM | POA: Diagnosis not present

## 2019-01-27 DIAGNOSIS — D631 Anemia in chronic kidney disease: Secondary | ICD-10-CM | POA: Diagnosis not present

## 2019-01-27 DIAGNOSIS — E782 Mixed hyperlipidemia: Secondary | ICD-10-CM | POA: Diagnosis not present

## 2019-01-27 DIAGNOSIS — I10 Essential (primary) hypertension: Secondary | ICD-10-CM | POA: Diagnosis not present

## 2019-01-27 DIAGNOSIS — E1122 Type 2 diabetes mellitus with diabetic chronic kidney disease: Secondary | ICD-10-CM | POA: Diagnosis not present

## 2019-01-27 DIAGNOSIS — I739 Peripheral vascular disease, unspecified: Secondary | ICD-10-CM | POA: Diagnosis not present

## 2019-01-27 DIAGNOSIS — N184 Chronic kidney disease, stage 4 (severe): Secondary | ICD-10-CM | POA: Diagnosis not present

## 2019-02-03 ENCOUNTER — Ambulatory Visit (HOSPITAL_COMMUNITY)
Admission: RE | Admit: 2019-02-03 | Discharge: 2019-02-03 | Disposition: A | Discharge status: Home / Self Care | Payer: Medicare HMO | Source: Ambulatory Visit | Attending: Otolaryngology | Admitting: Otolaryngology

## 2019-02-03 ENCOUNTER — Other Ambulatory Visit: Payer: Self-pay

## 2019-02-03 DIAGNOSIS — J329 Chronic sinusitis, unspecified: Secondary | ICD-10-CM | POA: Diagnosis not present

## 2019-02-03 DIAGNOSIS — J3489 Other specified disorders of nose and nasal sinuses: Secondary | ICD-10-CM | POA: Diagnosis not present

## 2019-02-10 ENCOUNTER — Ambulatory Visit (INDEPENDENT_AMBULATORY_CARE_PROVIDER_SITE_OTHER): Payer: Medicare HMO | Admitting: Otolaryngology

## 2019-02-10 DIAGNOSIS — H9209 Otalgia, unspecified ear: Secondary | ICD-10-CM | POA: Diagnosis not present

## 2019-02-10 DIAGNOSIS — J310 Chronic rhinitis: Secondary | ICD-10-CM | POA: Diagnosis not present

## 2019-02-10 DIAGNOSIS — J342 Deviated nasal septum: Secondary | ICD-10-CM | POA: Diagnosis not present

## 2019-02-23 ENCOUNTER — Inpatient Hospital Stay (HOSPITAL_COMMUNITY)
Admission: EM | Admit: 2019-02-23 | Discharge: 2019-03-05 | DRG: 291 | Disposition: A | Discharge status: Skilled Nursing Facility | Payer: Medicare HMO | Source: Skilled Nursing Facility | Attending: Internal Medicine | Admitting: Internal Medicine

## 2019-02-23 ENCOUNTER — Encounter (HOSPITAL_COMMUNITY): Payer: Self-pay | Admitting: Emergency Medicine

## 2019-02-23 ENCOUNTER — Other Ambulatory Visit: Payer: Self-pay

## 2019-02-23 ENCOUNTER — Emergency Department (HOSPITAL_COMMUNITY): Payer: Medicare HMO

## 2019-02-23 DIAGNOSIS — Z794 Long term (current) use of insulin: Secondary | ICD-10-CM

## 2019-02-23 DIAGNOSIS — R339 Retention of urine, unspecified: Secondary | ICD-10-CM | POA: Diagnosis not present

## 2019-02-23 DIAGNOSIS — R0602 Shortness of breath: Secondary | ICD-10-CM | POA: Diagnosis not present

## 2019-02-23 DIAGNOSIS — E662 Morbid (severe) obesity with alveolar hypoventilation: Secondary | ICD-10-CM | POA: Diagnosis present

## 2019-02-23 DIAGNOSIS — N1330 Unspecified hydronephrosis: Secondary | ICD-10-CM | POA: Diagnosis not present

## 2019-02-23 DIAGNOSIS — N179 Acute kidney failure, unspecified: Secondary | ICD-10-CM | POA: Diagnosis not present

## 2019-02-23 DIAGNOSIS — N189 Chronic kidney disease, unspecified: Secondary | ICD-10-CM | POA: Diagnosis present

## 2019-02-23 DIAGNOSIS — N320 Bladder-neck obstruction: Secondary | ICD-10-CM | POA: Diagnosis not present

## 2019-02-23 DIAGNOSIS — I5033 Acute on chronic diastolic (congestive) heart failure: Secondary | ICD-10-CM | POA: Diagnosis present

## 2019-02-23 DIAGNOSIS — R0989 Other specified symptoms and signs involving the circulatory and respiratory systems: Secondary | ICD-10-CM | POA: Diagnosis not present

## 2019-02-23 DIAGNOSIS — R0609 Other forms of dyspnea: Secondary | ICD-10-CM

## 2019-02-23 DIAGNOSIS — I509 Heart failure, unspecified: Secondary | ICD-10-CM | POA: Diagnosis not present

## 2019-02-23 DIAGNOSIS — N132 Hydronephrosis with renal and ureteral calculous obstruction: Secondary | ICD-10-CM | POA: Diagnosis not present

## 2019-02-23 DIAGNOSIS — I5031 Acute diastolic (congestive) heart failure: Secondary | ICD-10-CM | POA: Diagnosis present

## 2019-02-23 DIAGNOSIS — Z20828 Contact with and (suspected) exposure to other viral communicable diseases: Secondary | ICD-10-CM | POA: Diagnosis not present

## 2019-02-23 DIAGNOSIS — E11621 Type 2 diabetes mellitus with foot ulcer: Secondary | ICD-10-CM

## 2019-02-23 DIAGNOSIS — J9601 Acute respiratory failure with hypoxia: Secondary | ICD-10-CM | POA: Diagnosis not present

## 2019-02-23 DIAGNOSIS — D3502 Benign neoplasm of left adrenal gland: Secondary | ICD-10-CM | POA: Diagnosis not present

## 2019-02-23 DIAGNOSIS — N049 Nephrotic syndrome with unspecified morphologic changes: Secondary | ICD-10-CM | POA: Diagnosis present

## 2019-02-23 DIAGNOSIS — B961 Klebsiella pneumoniae [K. pneumoniae] as the cause of diseases classified elsewhere: Secondary | ICD-10-CM | POA: Diagnosis not present

## 2019-02-23 DIAGNOSIS — Z888 Allergy status to other drugs, medicaments and biological substances status: Secondary | ICD-10-CM

## 2019-02-23 DIAGNOSIS — I110 Hypertensive heart disease with heart failure: Secondary | ICD-10-CM | POA: Diagnosis not present

## 2019-02-23 DIAGNOSIS — R338 Other retention of urine: Secondary | ICD-10-CM | POA: Diagnosis not present

## 2019-02-23 DIAGNOSIS — Z87891 Personal history of nicotine dependence: Secondary | ICD-10-CM | POA: Diagnosis not present

## 2019-02-23 DIAGNOSIS — N184 Chronic kidney disease, stage 4 (severe): Secondary | ICD-10-CM | POA: Diagnosis not present

## 2019-02-23 DIAGNOSIS — Z9049 Acquired absence of other specified parts of digestive tract: Secondary | ICD-10-CM | POA: Diagnosis not present

## 2019-02-23 DIAGNOSIS — L03115 Cellulitis of right lower limb: Secondary | ICD-10-CM | POA: Clinically undetermined

## 2019-02-23 DIAGNOSIS — M1990 Unspecified osteoarthritis, unspecified site: Secondary | ICD-10-CM | POA: Diagnosis present

## 2019-02-23 DIAGNOSIS — I130 Hypertensive heart and chronic kidney disease with heart failure and stage 1 through stage 4 chronic kidney disease, or unspecified chronic kidney disease: Principal | ICD-10-CM | POA: Diagnosis present

## 2019-02-23 DIAGNOSIS — I739 Peripheral vascular disease, unspecified: Secondary | ICD-10-CM | POA: Diagnosis not present

## 2019-02-23 DIAGNOSIS — B9689 Other specified bacterial agents as the cause of diseases classified elsewhere: Secondary | ICD-10-CM | POA: Diagnosis present

## 2019-02-23 DIAGNOSIS — N3080 Other cystitis without hematuria: Secondary | ICD-10-CM | POA: Diagnosis not present

## 2019-02-23 DIAGNOSIS — J90 Pleural effusion, not elsewhere classified: Secondary | ICD-10-CM | POA: Diagnosis not present

## 2019-02-23 DIAGNOSIS — E119 Type 2 diabetes mellitus without complications: Secondary | ICD-10-CM

## 2019-02-23 DIAGNOSIS — R0600 Dyspnea, unspecified: Secondary | ICD-10-CM | POA: Diagnosis present

## 2019-02-23 DIAGNOSIS — I10 Essential (primary) hypertension: Secondary | ICD-10-CM | POA: Diagnosis present

## 2019-02-23 DIAGNOSIS — E785 Hyperlipidemia, unspecified: Secondary | ICD-10-CM | POA: Diagnosis not present

## 2019-02-23 DIAGNOSIS — E161 Other hypoglycemia: Secondary | ICD-10-CM | POA: Diagnosis not present

## 2019-02-23 DIAGNOSIS — E1165 Type 2 diabetes mellitus with hyperglycemia: Secondary | ICD-10-CM | POA: Diagnosis present

## 2019-02-23 DIAGNOSIS — N390 Urinary tract infection, site not specified: Secondary | ICD-10-CM | POA: Diagnosis present

## 2019-02-23 DIAGNOSIS — R05 Cough: Secondary | ICD-10-CM | POA: Diagnosis not present

## 2019-02-23 DIAGNOSIS — Z6841 Body Mass Index (BMI) 40.0 and over, adult: Secondary | ICD-10-CM | POA: Diagnosis not present

## 2019-02-23 DIAGNOSIS — Z8249 Family history of ischemic heart disease and other diseases of the circulatory system: Secondary | ICD-10-CM

## 2019-02-23 DIAGNOSIS — E162 Hypoglycemia, unspecified: Secondary | ICD-10-CM | POA: Diagnosis not present

## 2019-02-23 DIAGNOSIS — I890 Lymphedema, not elsewhere classified: Secondary | ICD-10-CM | POA: Diagnosis not present

## 2019-02-23 DIAGNOSIS — Z79899 Other long term (current) drug therapy: Secondary | ICD-10-CM

## 2019-02-23 DIAGNOSIS — D631 Anemia in chronic kidney disease: Secondary | ICD-10-CM | POA: Diagnosis present

## 2019-02-23 DIAGNOSIS — K5900 Constipation, unspecified: Secondary | ICD-10-CM | POA: Diagnosis not present

## 2019-02-23 DIAGNOSIS — D6489 Other specified anemias: Secondary | ICD-10-CM | POA: Diagnosis not present

## 2019-02-23 DIAGNOSIS — L97509 Non-pressure chronic ulcer of other part of unspecified foot with unspecified severity: Secondary | ICD-10-CM

## 2019-02-23 DIAGNOSIS — I5032 Chronic diastolic (congestive) heart failure: Secondary | ICD-10-CM | POA: Diagnosis present

## 2019-02-23 DIAGNOSIS — Z9189 Other specified personal risk factors, not elsewhere classified: Secondary | ICD-10-CM | POA: Diagnosis not present

## 2019-02-23 DIAGNOSIS — L97319 Non-pressure chronic ulcer of right ankle with unspecified severity: Secondary | ICD-10-CM | POA: Diagnosis not present

## 2019-02-23 DIAGNOSIS — N183 Chronic kidney disease, stage 3 (moderate): Secondary | ICD-10-CM

## 2019-02-23 DIAGNOSIS — I872 Venous insufficiency (chronic) (peripheral): Secondary | ICD-10-CM | POA: Diagnosis present

## 2019-02-23 DIAGNOSIS — D72829 Elevated white blood cell count, unspecified: Secondary | ICD-10-CM

## 2019-02-23 DIAGNOSIS — E1140 Type 2 diabetes mellitus with diabetic neuropathy, unspecified: Secondary | ICD-10-CM | POA: Diagnosis present

## 2019-02-23 DIAGNOSIS — L409 Psoriasis, unspecified: Secondary | ICD-10-CM | POA: Diagnosis present

## 2019-02-23 DIAGNOSIS — I878 Other specified disorders of veins: Secondary | ICD-10-CM | POA: Diagnosis present

## 2019-02-23 DIAGNOSIS — L97529 Non-pressure chronic ulcer of other part of left foot with unspecified severity: Secondary | ICD-10-CM | POA: Diagnosis present

## 2019-02-23 DIAGNOSIS — Z209 Contact with and (suspected) exposure to unspecified communicable disease: Secondary | ICD-10-CM | POA: Diagnosis not present

## 2019-02-23 DIAGNOSIS — E1122 Type 2 diabetes mellitus with diabetic chronic kidney disease: Secondary | ICD-10-CM | POA: Diagnosis present

## 2019-02-23 DIAGNOSIS — N3090 Cystitis, unspecified without hematuria: Secondary | ICD-10-CM | POA: Diagnosis present

## 2019-02-23 DIAGNOSIS — E1121 Type 2 diabetes mellitus with diabetic nephropathy: Secondary | ICD-10-CM | POA: Diagnosis not present

## 2019-02-23 LAB — BASIC METABOLIC PANEL
Anion gap: 9 (ref 5–15)
BUN: 43 mg/dL — ABNORMAL HIGH (ref 8–23)
CO2: 22 mmol/L (ref 22–32)
Calcium: 8.6 mg/dL — ABNORMAL LOW (ref 8.9–10.3)
Chloride: 108 mmol/L (ref 98–111)
Creatinine, Ser: 2.86 mg/dL — ABNORMAL HIGH (ref 0.61–1.24)
GFR calc Af Amer: 26 mL/min — ABNORMAL LOW (ref >60)
GFR calc non Af Amer: 22 mL/min — ABNORMAL LOW (ref >60)
Glucose, Bld: 70 mg/dL (ref 70–99)
Potassium: 4.1 mmol/L (ref 3.5–5.1)
Sodium: 139 mmol/L (ref 135–145)

## 2019-02-23 LAB — HEPATIC FUNCTION PANEL
ALT: 28 U/L (ref 0–44)
AST: 20 U/L (ref 15–41)
Albumin: 2.9 g/dL — ABNORMAL LOW (ref 3.5–5.0)
Alkaline Phosphatase: 116 U/L (ref 38–126)
Bilirubin, Direct: 0.3 mg/dL — ABNORMAL HIGH (ref 0.0–0.2)
Indirect Bilirubin: 0.9 mg/dL (ref 0.3–0.9)
Total Bilirubin: 1.2 mg/dL (ref 0.3–1.2)
Total Protein: 7.1 g/dL (ref 6.5–8.1)

## 2019-02-23 LAB — URINALYSIS, ROUTINE W REFLEX MICROSCOPIC
Bacteria, UA: NONE SEEN
Bilirubin Urine: NEGATIVE
Glucose, UA: 50 mg/dL — AB
Hgb urine dipstick: NEGATIVE
Ketones, ur: NEGATIVE mg/dL
Leukocytes,Ua: NEGATIVE
Nitrite: NEGATIVE
Protein, ur: 100 mg/dL — AB
Specific Gravity, Urine: 1.012 (ref 1.005–1.030)
pH: 6 (ref 5.0–8.0)

## 2019-02-23 LAB — CBC WITH DIFFERENTIAL/PLATELET
Abs Immature Granulocytes: 0.05 10*3/uL (ref 0.00–0.07)
Basophils Absolute: 0 10*3/uL (ref 0.0–0.1)
Basophils Relative: 0 %
Eosinophils Absolute: 0.3 10*3/uL (ref 0.0–0.5)
Eosinophils Relative: 3 %
HCT: 28.2 % — ABNORMAL LOW (ref 39.0–52.0)
Hemoglobin: 8.8 g/dL — ABNORMAL LOW (ref 13.0–17.0)
Immature Granulocytes: 1 %
Lymphocytes Relative: 13 %
Lymphs Abs: 1.2 10*3/uL (ref 0.7–4.0)
MCH: 26.5 pg (ref 26.0–34.0)
MCHC: 31.2 g/dL (ref 30.0–36.0)
MCV: 84.9 fL (ref 80.0–100.0)
Monocytes Absolute: 0.5 10*3/uL (ref 0.1–1.0)
Monocytes Relative: 5 %
Neutro Abs: 7.3 10*3/uL (ref 1.7–7.7)
Neutrophils Relative %: 78 %
Platelets: 219 10*3/uL (ref 150–400)
RBC: 3.32 MIL/uL — ABNORMAL LOW (ref 4.22–5.81)
RDW: 17.9 % — ABNORMAL HIGH (ref 11.5–15.5)
WBC: 9.4 10*3/uL (ref 4.0–10.5)
nRBC: 0 % (ref 0.0–0.2)

## 2019-02-23 LAB — CBG MONITORING, ED: Glucose-Capillary: 80 mg/dL (ref 70–99)

## 2019-02-23 LAB — TROPONIN I (HIGH SENSITIVITY)
Troponin I (High Sensitivity): 7 ng/L (ref <18)
Troponin I (High Sensitivity): 7 ng/L (ref <18)

## 2019-02-23 LAB — BRAIN NATRIURETIC PEPTIDE: B Natriuretic Peptide: 201 pg/mL — ABNORMAL HIGH (ref 0.0–100.0)

## 2019-02-23 LAB — GLUCOSE, CAPILLARY: Glucose-Capillary: 150 mg/dL — ABNORMAL HIGH (ref 70–99)

## 2019-02-23 LAB — SAMPLE TO BLOOD BANK

## 2019-02-23 LAB — SARS CORONAVIRUS 2 BY RT PCR (HOSPITAL ORDER, PERFORMED IN ~~LOC~~ HOSPITAL LAB): SARS Coronavirus 2: NEGATIVE

## 2019-02-23 LAB — POC OCCULT BLOOD, ED: Fecal Occult Bld: NEGATIVE

## 2019-02-23 MED ORDER — POTASSIUM CHLORIDE CRYS ER 20 MEQ PO TBCR: 20.0000 meq | EXTENDED_RELEASE_TABLET | Freq: Once | ORAL | Status: DC

## 2019-02-23 MED ORDER — POLYETHYLENE GLYCOL 3350 17 G PO PACK
17.0000 g | PACK | Freq: Every day | ORAL | Status: DC | PRN
  Filled 2019-02-23: qty 1

## 2019-02-23 MED ORDER — FUROSEMIDE 10 MG/ML IJ SOLN
20.0000 mg | Freq: Once | INTRAMUSCULAR | Status: AC
  Administered 2019-02-23: 20 mg via INTRAVENOUS
  Filled 2019-02-23: qty 2

## 2019-02-23 MED ORDER — ONDANSETRON HCL 4 MG/2ML IJ SOLN: 4.0000 mg | Freq: Four times a day (QID) | INTRAMUSCULAR | Status: DC | PRN

## 2019-02-23 MED ORDER — INSULIN ASPART 100 UNIT/ML ~~LOC~~ SOLN
0.0000 [IU] | Freq: Three times a day (TID) | SUBCUTANEOUS | Status: DC
  Administered 2019-02-24 – 2019-02-26 (×5): 1 [IU] via SUBCUTANEOUS
  Administered 2019-02-26: 2 [IU] via SUBCUTANEOUS
  Administered 2019-02-27 (×2): 3 [IU] via SUBCUTANEOUS
  Administered 2019-02-27: 1 [IU] via SUBCUTANEOUS
  Administered 2019-02-28: 2 [IU] via SUBCUTANEOUS
  Administered 2019-02-28: 3 [IU] via SUBCUTANEOUS
  Administered 2019-02-28 – 2019-03-02 (×4): 2 [IU] via SUBCUTANEOUS
  Administered 2019-03-02 (×2): 3 [IU] via SUBCUTANEOUS
  Administered 2019-03-03 (×2): 2 [IU] via SUBCUTANEOUS
  Administered 2019-03-03 – 2019-03-04 (×2): 1 [IU] via SUBCUTANEOUS
  Administered 2019-03-05: 2 [IU] via SUBCUTANEOUS
  Administered 2019-03-05: 1 [IU] via SUBCUTANEOUS
  Administered 2019-03-05: 2 [IU] via SUBCUTANEOUS

## 2019-02-23 MED ORDER — SODIUM CHLORIDE 0.9% FLUSH: 3.0000 mL | Freq: Once | INTRAVENOUS | Status: DC

## 2019-02-23 MED ORDER — ENOXAPARIN SODIUM 30 MG/0.3ML ~~LOC~~ SOLN
30.0000 mg | SUBCUTANEOUS | Status: DC
  Administered 2019-02-24: 30 mg via SUBCUTANEOUS
  Filled 2019-02-23: qty 0.3

## 2019-02-23 MED ORDER — ALLOPURINOL 300 MG PO TABS
300.0000 mg | ORAL_TABLET | Freq: Every day | ORAL | Status: DC
  Administered 2019-02-24 – 2019-03-05 (×10): 300 mg via ORAL
  Filled 2019-02-23 (×12): qty 1

## 2019-02-23 MED ORDER — ACETAMINOPHEN 650 MG RE SUPP: 650.0000 mg | Freq: Four times a day (QID) | RECTAL | Status: DC | PRN

## 2019-02-23 MED ORDER — METOPROLOL SUCCINATE ER 50 MG PO TB24
100.0000 mg | ORAL_TABLET | Freq: Every evening | ORAL | Status: DC
  Administered 2019-02-24 – 2019-03-05 (×10): 100 mg via ORAL
  Filled 2019-02-23 (×10): qty 2

## 2019-02-23 MED ORDER — ONDANSETRON HCL 4 MG PO TABS
4.0000 mg | ORAL_TABLET | Freq: Four times a day (QID) | ORAL | Status: DC | PRN
  Administered 2019-02-24: 4 mg via ORAL
  Filled 2019-02-23: qty 1

## 2019-02-23 MED ORDER — AMLODIPINE BESYLATE 5 MG PO TABS
10.0000 mg | ORAL_TABLET | Freq: Every day | ORAL | Status: DC
  Administered 2019-02-23 – 2019-03-04 (×10): 10 mg via ORAL
  Filled 2019-02-23 (×10): qty 2

## 2019-02-23 MED ORDER — FUROSEMIDE 10 MG/ML IJ SOLN
40.0000 mg | Freq: Two times a day (BID) | INTRAMUSCULAR | Status: DC
  Administered 2019-02-24 – 2019-02-25 (×4): 40 mg via INTRAVENOUS
  Filled 2019-02-23 (×6): qty 4

## 2019-02-23 MED ORDER — INSULIN ASPART PROT & ASPART (70-30 MIX) 100 UNIT/ML ~~LOC~~ SUSP
20.0000 [IU] | Freq: Two times a day (BID) | SUBCUTANEOUS | Status: DC
  Administered 2019-02-24 – 2019-03-02 (×11): 20 [IU] via SUBCUTANEOUS
  Filled 2019-02-23: qty 10

## 2019-02-23 MED ORDER — ACETAMINOPHEN 325 MG PO TABS
650.0000 mg | ORAL_TABLET | Freq: Four times a day (QID) | ORAL | Status: DC | PRN
  Administered 2019-02-25 – 2019-03-05 (×9): 650 mg via ORAL
  Filled 2019-02-23 (×10): qty 2

## 2019-02-23 NOTE — ED Notes (Signed)
Second call for report  No answer

## 2019-02-23 NOTE — ED Notes (Signed)
Meal provided 

## 2019-02-23 NOTE — ED Notes (Signed)
Report to Maudie Mercury, RN  Pt to floor

## 2019-02-23 NOTE — ED Notes (Signed)
Call for report (#3)  Bed has not been approved nor assigned per POC

## 2019-02-23 NOTE — ED Notes (Signed)
Meal provided after crackers and soda snack

## 2019-02-23 NOTE — ED Provider Notes (Signed)
[facility] EMERGENCY DEPARTMENT Provider Note   CSN: [id number] Arrival date & time: 02/23/19  1413     History   Chief Complaint Chief Complaint  Patient presents with  . Shortness of Breath    HPI Danny [name] is a 63 y.o. male with a hx of DM & HTN who presents to the ED w/ complaints of dyspnea x 2 weeks. Patient states that he is having dyspnea primarily with exertion to the point that he becomes short of breath walking short distances such as from his living room to the bathroom. Alleviated some with rest. He typically sleeps upright some, but is having to do so more recently by sleeping sitting in a chair, if he lays flat he becomes rather short of breath. He has had associated productive cough of white phlegm sputum as well as intermittent chest tightnes. Other than exertion and positions no significant alleviating/aggravating factors. Denies fever, chills, N/V, diaphoresis, or syncope. He states he does have swelling in his legs which are not acutely changed. No unilateral leg pain. He does not smoke tobacco products. No prior hx of CHF.      HPI  Past Medical History:  Diagnosis Date  . Arthritis   . Diabetes mellitus without complication ([place])   . Foot ulcer ([place])   . Hypertension     There are no active problems to display for this patient.   Past Surgical History:  Procedure Laterality Date  . ANKLE SURGERY    . CHOLECYSTECTOMY    . FOOT SURGERY          Home Medications    Prior to Admission medications   Medication Sig Start Date End Date Taking? Authorizing Provider  allopurinol (ZYLOPRIM) 300 MG tablet Take 1 tablet by mouth daily. 01/10/18   [provider]  augmented betamethasone dipropionate (DIPROLENE-AF) 0.05 % cream APPLY CREAM TOPICALLY TO AFFECTED AREA (LEGS) UP TO TWICE DAILY AS NEEDED (NOT TO FACE GROIN OR UNDERARMS) 11/28/17   [provider]  Brodalumab 210 MG/1.5ML SOSY Inject 210 mg into the skin every 14 (fourteen) days.  12/25/17   [provider]  glycopyrrolate (ROBINUL) 2 MG tablet Take 2 mg by mouth 2 (two) times daily. 12/30/17   [provider]  hydrochlorothiazide (HYDRODIURIL) 25 MG tablet Take 1 tablet by mouth 2 (two) times daily. 01/10/18   [provider]  lisinopril (PRINIVIL,ZESTRIL) 20 MG tablet Take 1 tablet by mouth 2 (two) times daily. 11/05/17   [provider]  metoprolol succinate (TOPROL-XL) 100 MG 24 hr tablet Take 1 tablet by mouth daily. 12/17/17   [provider]  NOVOLIN 70/30 RELION (70-30) 100 UNIT/ML injection Inject 20-100 Units into the skin See admin instructions. 100 units at breakfast, 20 units at lunch, and 100 unit at dinner. 12/20/17   [provider]  pravastatin (PRAVACHOL) 40 MG tablet Take 1 tablet by mouth daily. 01/10/18   [provider]  verapamil (CALAN) 120 MG tablet Take 1 tablet by mouth daily. 11/05/17   [provider]    Family History No family history on file.  Social History Social History   Tobacco Use  . Smoking status: Never Smoker  . Smokeless tobacco: Never Used  Substance Use Topics  . Alcohol use: No  . Drug use: No     Allergies   Dust mite extract, Prednisone, and Rocephin [ceftriaxone]   Review of Systems Review of Systems  Constitutional: Negative for chills and fever.  Respiratory: Positive for  cough, chest tightness and shortness of breath.   Cardiovascular: Positive for leg swelling (unchanged from baseline).  Gastrointestinal: Negative for abdominal pain, nausea and vomiting.  Neurological: Negative for syncope.  All other systems reviewed and are negative.    Physical Exam Updated Vital Signs BP (!) 152/71 (BP Location: Right Arm)   Pulse (!) 57   Temp 97.6 F (36.4 C) (Oral)   Resp 14   Ht 6' (1.829 m)   Wt (!) 142 kg   SpO2 98%   BMI 42.46 kg/m   Physical Exam Vitals signs and nursing note reviewed.  Constitutional:      General: He is not in  acute distress.    Appearance: He is well-developed. He is not toxic-appearing.  HENT:     Head: Normocephalic and atraumatic.  Eyes:     General:        Right eye: No discharge.        Left eye: No discharge.     Conjunctiva/sclera: Conjunctivae normal.     Pupils: Pupils are equal, round, and reactive to light.  Neck:     Musculoskeletal: Neck supple. No neck rigidity.  Cardiovascular:     Rate and Rhythm: Normal rate and regular rhythm.  Pulmonary:     Effort: No respiratory distress.     Breath sounds: Decreased breath sounds (bibasilar) and rales (bibasilar) present. No wheezing or rhonchi.     Comments: SpO2 92% on RA- placed on 2L via [place] w/ improvement to > 95% Abdominal:     General: There is no distension.     Palpations: Abdomen is soft.     Tenderness: There is no abdominal tenderness.  Musculoskeletal:     Comments: 2+ symmetric pitting edema to the bilateral lower extremities. Chronic skin changes & scabbing noted bilaterally. Ulcer type wound to the plantar aspect of the L foot and to the lateral aspect of the R ankle- each without significant erythema/warmth or purulent drainage. No calf tenderness.   Skin:    General: Skin is warm and dry.     Findings: No rash.  Neurological:     Mental Status: He is alert.     Comments: Clear speech.   Psychiatric:        Behavior: Behavior normal.    ED Treatments / Results  Labs (all labs ordered are listed, but only abnormal results are displayed) Labs Reviewed  BASIC METABOLIC PANEL - Abnormal; Notable for the following components:      Result Value   BUN 43 (*)    Creatinine, Ser 2.86 (*)    Calcium 8.6 (*)    GFR calc non Af Amer 22 (*)    GFR calc Af Amer 26 (*)    All other components within normal limits  BRAIN NATRIURETIC PEPTIDE - Abnormal; Notable for the following components:   B Natriuretic Peptide 201.0 (*)    All other components within normal limits  CBC WITH DIFFERENTIAL/PLATELET - Abnormal; Notable  for the following components:   RBC 3.32 (*)    Hemoglobin 8.8 (*)    HCT 28.2 (*)    RDW 17.9 (*)    All other components within normal limits  URINALYSIS, ROUTINE W REFLEX MICROSCOPIC - Abnormal; Notable for the following components:   Glucose, UA 50 (*)    Protein, ur 100 (*)    All other components within normal limits  SARS CORONAVIRUS 2 (HOSPITAL ORDER, [facility])  POC OCCULT BLOOD, ED  TROPONIN I (HIGH SENSITIVITY)  TROPONIN I (HIGH SENSITIVITY)    EKG EKG Interpretation  Date/Time:  Sunday February 23 2019 14:27:37 EDT Ventricular Rate:  59 PR Interval:    QRS Duration: 92 QT Interval:  443 QTC Calculation: 439 R Axis:   67 Text Interpretation:  Junctional rhythm Low voltage, precordial leads Confirmed by Nat Christen [phone]) on 02/23/2019 4:02:26 PM   Radiology Dg Chest 2 View  Result Date: 02/23/2019 CLINICAL DATA:  Shortness of breath with exertion for several weeks. EXAM: CHEST - 2 VIEW COMPARISON:  None. FINDINGS: Thin accounting for the AP upright projection there is mild to moderate enlargement of the cardiopericardial silhouette. Blunted posterior costophrenic angles favoring small bilateral pleural effusions with passive atelectasis. No edema is observed. Mild thoracic spondylosis. IMPRESSION: 1. Mild to moderate enlargement of the cardiopericardial silhouette with suspected small bilateral pleural effusions but no current edema. Electronically Signed   By: Van Clines M.D.   On: 02/23/2019 15:00    Procedures .Critical Care Performed by: Amaryllis Dyke, PA-C Authorized by: Amaryllis Dyke, PA-C    CRITICAL CARE Performed by: Kennith Maes   Total critical care time: 30 minutes- new onset CHF  Critical care time was exclusive of separately billable procedures and treating other patients.  Critical care was necessary to treat or prevent imminent or life-threatening deterioration.  Critical care was  time spent personally by me on the following activities: development of treatment plan with patient and/or surrogate as well as nursing, discussions with consultants, evaluation of patient's response to treatment, examination of patient, obtaining history from patient or surrogate, ordering and performing treatments and interventions, ordering and review of laboratory studies, ordering and review of radiographic studies, pulse oximetry and re-evaluation of patient's condition. (including critical care time)  Medications Ordered in ED Medications  sodium chloride flush (NS) 0.9 % injection 3 mL (has no administration in time range)     Initial Impression / Assessment and Plan / ED Course  I have reviewed the triage vital signs and the nursing notes.  Pertinent labs & imaging results that were available during my care of the patient were reviewed by me and considered in my medical decision making (see chart for details).   Patient presents to the ED for evaluation of dyspnea primarily w/ exertion x 2 weeks that is progressively worsening. Nontoxic appearing, vitals w/ mildly elevated BP & intermittent bradycardia, regular rate on my exam. SpO2 borderline on RA @ 92%, placed on 2L via [place] w/ good response @ rest, did not trial ambulation on RA. He has decreased breath sounds and rales bilaterally with pitting edema to the BLE. DDX: new onset CHF, bacterial pneumonia, COVID 63, PE.   Work-up reviewed:  CBC: No leukocytosis. Anemia which is worsened from prior; hgb 8.8 hct 28.2 today worsened from 11.5/34.9 respectively when last checked--> fecal occult blood checked & negative.  CMP: AKI; creatinine 2.86 BUN 43 today worsened from prior 1.95/23 when last checked. Mild hypokalemia but no significant electrolyte derangement.  UA: Glucosuria & proteinuria, no UTI.  Trop: 7 BNP: mild elevation @ 201.0 EKG: no STEMI CXR: 1. Mild to moderate enlargement of the cardiopericardial silhouette with suspected  small bilateral pleural effusions but no current edema.   Concern for new onset CHF w/ AKI.  20 mg of Lasix ordered as patient is diuretic naive.  Covid ordered & pending.  Consult placed to hopsitalist service.   Discussed results & plan of care with patient & his sister  via telephone at his request, provided opportunity for questions, they are in agreement.   16:30:CONSULT: Discussed with hospitalist Dr. Denton Brick who accepts admission.  Findings and plan of care discussed with supervising physician Dr. Lacinda Axon who is in agreement.   Senay Sistrunk was evaluated in Emergency Department on 02/23/2019 for the symptoms described in the history of present illness. He/she was evaluated in the context of the global COVID-19 pandemic, which necessitated consideration that the patient might be at risk for infection with the SARS-CoV-2 virus that causes COVID-19. Institutional protocols and algorithms that pertain to the evaluation of patients at risk for COVID-19 are in a state of rapid change based on information released by regulatory bodies including the CDC and federal and state organizations. These policies and algorithms were followed during the patient's care in the ED.    Final Clinical Impressions(s) / ED Diagnoses   Final diagnoses:  New onset of congestive heart failure ([place])  AKI (acute kidney injury) [facility])    ED Discharge Orders    None       Amaryllis Dyke, PA-C 02/23/19 1658    Nat Christen, MD 02/26/19 1421

## 2019-02-23 NOTE — ED Notes (Signed)
To Rad 

## 2019-02-23 NOTE — ED Triage Notes (Signed)
Shortness of breath several weeks  Coughing up white sputum  Seen  by Dr Nevada Crane and URI ruled out - no cardiac workup  Also complains of "easily exerted"  Pitting edema to bilateral LE  Also complains of foul smelling dark urine

## 2019-02-23 NOTE — ED Notes (Signed)
Pt has eaten dinner   Awaiting bed ready

## 2019-02-23 NOTE — H&P (Addendum)
History and Physical    Hardy Harcum HUD:[id number] DOB: April 05, 1956 DOA: 02/23/2019  PCP: Celene Squibb, MD   Patient coming from: Home  I have personally briefly reviewed patient's old medical records in [place]  Chief Complaint: SOB, Leg swelling  HPI: Danny [name] is a 63 y.o. male with medical history significant for hypertension, diabetes mellitus, psoriasis, chronic kidney disease, presented to the ED with complaints of difficulty breathing with exertion over the past 2 weeks.  He has not been able to lie flat to sleep and has had to sit up in a chair.  Reports cough productive of whitish sputum over this time.  He also reports abdominal bloating, and worsening lower extremity swelling.  He does not check his weights daily, but he reports a 20 pound weight gain from 260 to 280 lbs august 1st between the time period of his visits to his primary care doctor, when his weight was checked.  His weight today is 313 pounds. He denies chest pain.  He denies vomiting, no black stools.  He reports mild lower abdominal pain.  Reports he was told about a month ago that his blood count was low because of his low iron.  ED Course: O2 > 91% on room air, improved with nasal cannula.  BNP 201.  Hemoglobin low 8.8.  Creatinine elevated 2.86.  High-sensitivity troponin 7.  EKG shows junctional rhythm, rate 59.  Stool FOBT negative.  Two-view chest x-ray - mild to moderate enlargement of the cardiopericardial silhouette with suspected small bilateral pleural effusions but no current Edema. Patient given IV Lasix 20 mg x 1 due to concern for worsening renal function.  Hospitalist to admit for fluid overload likely new onset CHF.   Review of Systems: As per HPI all other systems reviewed and negative.  Past Medical History:  Diagnosis Date  . Arthritis   . Diabetes mellitus without complication ([place])   . Foot ulcer ([place])   . Hypertension     Past Surgical History:  Procedure Laterality Date  .  ANKLE SURGERY    . CHOLECYSTECTOMY    . FOOT SURGERY       reports that he has never smoked. He has never used smokeless tobacco. He reports that he does not drink alcohol or use drugs.  Allergies  Allergen Reactions  . Dust Mite Extract Itching and Other (See Comments)    Unknown reaction-potential shortness of breath  . Prednisone Nausea And Vomiting  . Rocephin [Ceftriaxone] Nausea And Vomiting   Family history of hypertension.  Prior to Admission medications   Medication Sig Start Date End Date Taking? Authorizing Provider  allopurinol (ZYLOPRIM) 300 MG tablet Take 1 tablet by mouth daily. 01/10/18  Yes [provider]  amLODipine (NORVASC) 10 MG tablet Take 10 mg by mouth at bedtime. 11/27/18  Yes [provider]  Brodalumab 210 MG/1.5ML SOSY Inject 210 mg into the skin every 14 (fourteen) days. 12/25/17  Yes [provider]  Colloidal Oatmeal (ECZEMA MOISTURIZING EX) Apply 1 application topically daily as needed (applied to legs).   Yes [provider]  furosemide (LASIX) 20 MG tablet Take 20 mg by mouth daily.  01/18/19  Yes [provider]  glipiZIDE (GLUCOTROL) 10 MG tablet Take 10 mg by mouth 2 (two) times daily before a meal.  01/29/19  Yes [provider]  lisinopril (PRINIVIL,ZESTRIL) 20 MG tablet Take 20 mg by mouth 2 (two) times daily.  11/05/17  Yes [provider]  metoprolol  succinate (TOPROL-XL) 100 MG 24 hr tablet Take 100 mg by mouth every evening.  12/17/17  Yes [provider]  NOVOLIN 70/30 RELION (70-30) 100 UNIT/ML injection Inject 10-80 Units into the skin 3 (three) times daily with meals. 10-20 units for blood sugar levels around 150 and up to 80 units for blood sugar levels at 300 or more 12/20/17  Yes [provider]  pravastatin (PRAVACHOL) 80 MG tablet Take 80 mg by mouth every evening.  01/10/18  Yes [provider]    Physical Exam: Vitals:   02/23/19 1530 02/23/19 1545  02/23/19 1603 02/23/19 1631  BP:    (!) 154/68  Pulse: 63 (!) 54 (!) 56 (!) 55  Resp: 19 17 13 20   Temp:      TempSrc:      SpO2: 91% 95% 94% 94%  Weight:      Height:        Constitutional: NAD, calm, comfortable Vitals:   02/23/19 1530 02/23/19 1545 02/23/19 1603 02/23/19 1631  BP:    (!) 154/68  Pulse: 63 (!) 54 (!) 56 (!) 55  Resp: 19 17 13 20   Temp:      TempSrc:      SpO2: 91% 95% 94% 94%  Weight:      Height:       Eyes: PERRL, lids and conjunctivae normal ENMT: Mucous membranes are moist. Posterior pharynx clear of any exudate or lesions. Neck: normal, supple, no masses, no thyromegaly Respiratory: no wheezing, bilateral basilar crackles. Normal respiratory effort. No accessory muscle use.  Cardiovascular: Regular rate and rhythm, no murmurs / rubs / gallops.  2+ bilateral pitting pedal edema to knees, chronic stasis changes, 2+ pedal pulses.  Abdomen: Obese, no tenderness, no masses palpated. No hepatosplenomegaly. Bowel sounds positive.  Musculoskeletal: no clubbing / cyanosis. No joint deformity upper and lower extremities. Good ROM, no contractures. Normal muscle tone.  Skin: Multiple healing scabs bilateral lower extremity, worse on the left, chronic stasis changes bilaterally, healing chronic ulcer to right lateral malleolus, ~4 by 4 cm, with minimal drainage, no surrounding tenderness erythema or differential warmth, no induration Neurologic: CN 2-12 grossly intact.  Strength 5/5 in all 4.  Psychiatric: Normal judgment and insight. Alert and oriented x 3. Normal mood.   Labs on Admission: I have personally reviewed following labs and imaging studies  CBC: Recent Labs  Lab 02/23/19 1440  WBC 9.4  NEUTROABS 7.3  HGB 8.8*  HCT 28.2*  MCV 84.9  PLT 914   Basic Metabolic Panel: Recent Labs  Lab 02/23/19 1440  NA 139  K 4.1  CL 108  CO2 22  GLUCOSE 70  BUN 43*  CREATININE 2.86*  CALCIUM 8.6*   Urine analysis:    Component Value Date/Time    COLORURINE YELLOW 02/23/2019 [place] 02/23/2019 1531   LABSPEC 1.012 02/23/2019 1531   PHURINE 6.0 02/23/2019 1531   GLUCOSEU 50 (A) 02/23/2019 1531   HGBUR NEGATIVE 02/23/2019 1531   [place] 02/23/2019 [place] 02/23/2019 1531   PROTEINUR 100 (A) 02/23/2019 1531   NITRITE NEGATIVE 02/23/2019 1531   LEUKOCYTESUR NEGATIVE 02/23/2019 1531    Radiological Exams on Admission: Dg Chest 2 View  Result Date: 02/23/2019 CLINICAL DATA:  Shortness of breath with exertion for several weeks. EXAM: CHEST - 2 VIEW COMPARISON:  None. FINDINGS: Thin accounting for the AP upright projection there is mild to moderate enlargement of the cardiopericardial silhouette. Blunted posterior costophrenic angles  favoring small bilateral pleural effusions with passive atelectasis. No edema is observed. Mild thoracic spondylosis. IMPRESSION: 1. Mild to moderate enlargement of the cardiopericardial silhouette with suspected small bilateral pleural effusions but no current edema. Electronically Signed   By: Van Clines M.D.   On: 02/23/2019 15:00    EKG: Independently reviewed.  Junctional rhythm, rate 59, QRS 92.  Assessment/Plan Principal Problem:   Dyspnea Active Problems:   Essential hypertension   Diabetes mellitus (HCC)   CKD (chronic kidney disease)   Psoriasis   Dypsnea- with exertion and orthopnea, sats > 91% on room air 2+ pitting edema acute on chronic, BNP mildly elevated 201, but patient obese.  Reports weight gain, weight today 313, but per charts his baseline weight is 315-earlier this year. Hs Trop x2 unremarkable.  Two-view chest x-ray shows enlargement of pericardial silhouette and small bilateral pleural effusions.  ? new onset heart failure, ?  Symptomatic anemia.  IV Lasix 20 mg x 1 given in ED. -Repeat 20 mg Lasix, continue IV Lasix at 40 mg twice daily -Monitor BMP closely -Strict input outputs Daily weights -Supplemental O2  -EchoCardiogram -EKG shows junctional rhythm, narrow QRS with barely visible P waves, will repeat EKG in a.m. patient is on metoprolol.  Acute on chronic kidney disease 3 -creatinine 2.86, most recent creatinine in epic and care everywhere 1.9, 12/2017.  -Hold home lisinopril 20 mg twice daily for now -Diurese  Acute on chronic anemia-hemoglobin 8.8, baseline a year ago about 11.  Denies melena, or obvious blood loss, FOBT negative.  Reports recent diagnosis of low iron. ?  Occult GI bleed, anemia due to chronic disease-renal, psoriasis. -CBC a.m. -Iron panel in a.m, reticulocyte count  Hypertension-systolic 314H to 702O. -Resume home Norvasc, metoprolol -Hold home lisinopril  Diabetes mellitus-random glucose 70, from not eating all day. - SSI - HgbA1c -Resume home 70/30 insulin at reduced dose, hold glipizide 10 mg twice daily  Psoriasis, hx of lymphedema and chronic venous stasis- patient on brodalumab injections every 2 weeks.  Last dose this past Friday.  Chronic wound -Wound care consult   DVT prophylaxis: Lovenox Code Status: Full Family Communication: None at bedside Disposition Plan: Per rounding team Consults called: None Admission status: Inpatient, telemetry I certify that at the point of admission it is my clinical judgment that the patient will require inpatient hospital care spanning beyond 2 midnights from the point of admission due to high intensity of service, high risk for further deterioration and high frequency of surveillance required. The following factors support the patient status of inpatient: Work-up for multiple problems including probable new onset CHF, with worsening renal insufficiency requiring close monitoring and acute anemia.   Bethena Roys MD [facility]  02/23/2019, 6:20 PM

## 2019-02-24 ENCOUNTER — Inpatient Hospital Stay (HOSPITAL_COMMUNITY): Payer: Medicare HMO

## 2019-02-24 ENCOUNTER — Other Ambulatory Visit: Payer: Self-pay

## 2019-02-24 DIAGNOSIS — I509 Heart failure, unspecified: Secondary | ICD-10-CM

## 2019-02-24 DIAGNOSIS — I10 Essential (primary) hypertension: Secondary | ICD-10-CM

## 2019-02-24 DIAGNOSIS — N179 Acute kidney failure, unspecified: Secondary | ICD-10-CM

## 2019-02-24 DIAGNOSIS — R0600 Dyspnea, unspecified: Secondary | ICD-10-CM

## 2019-02-24 DIAGNOSIS — N184 Chronic kidney disease, stage 4 (severe): Secondary | ICD-10-CM | POA: Diagnosis present

## 2019-02-24 DIAGNOSIS — L409 Psoriasis, unspecified: Secondary | ICD-10-CM

## 2019-02-24 LAB — BASIC METABOLIC PANEL
Anion gap: 8 (ref 5–15)
BUN: 46 mg/dL — ABNORMAL HIGH (ref 8–23)
CO2: 23 mmol/L (ref 22–32)
Calcium: 8.7 mg/dL — ABNORMAL LOW (ref 8.9–10.3)
Chloride: 110 mmol/L (ref 98–111)
Creatinine, Ser: 2.92 mg/dL — ABNORMAL HIGH (ref 0.61–1.24)
GFR calc Af Amer: 25 mL/min — ABNORMAL LOW (ref >60)
GFR calc non Af Amer: 22 mL/min — ABNORMAL LOW (ref >60)
Glucose, Bld: 100 mg/dL — ABNORMAL HIGH (ref 70–99)
Potassium: 4.1 mmol/L (ref 3.5–5.1)
Sodium: 141 mmol/L (ref 135–145)

## 2019-02-24 LAB — HEMOGLOBIN A1C
Hgb A1c MFr Bld: 7.1 % — ABNORMAL HIGH (ref 4.8–5.6)
Mean Plasma Glucose: 157.07 mg/dL

## 2019-02-24 LAB — CBC
HCT: 27.8 % — ABNORMAL LOW (ref 39.0–52.0)
Hemoglobin: 8.7 g/dL — ABNORMAL LOW (ref 13.0–17.0)
MCH: 26.8 pg (ref 26.0–34.0)
MCHC: 31.3 g/dL (ref 30.0–36.0)
MCV: 85.5 fL (ref 80.0–100.0)
Platelets: 235 10*3/uL (ref 150–400)
RBC: 3.25 MIL/uL — ABNORMAL LOW (ref 4.22–5.81)
RDW: 17.8 % — ABNORMAL HIGH (ref 11.5–15.5)
WBC: 10.4 10*3/uL (ref 4.0–10.5)
nRBC: 0 % (ref 0.0–0.2)

## 2019-02-24 LAB — RETICULOCYTES
Immature Retic Fract: 27.7 % — ABNORMAL HIGH (ref 2.3–15.9)
RBC.: 3.25 MIL/uL — ABNORMAL LOW (ref 4.22–5.81)
Retic Count, Absolute: 98.8 10*3/uL (ref 19.0–186.0)
Retic Ct Pct: 3 % (ref 0.4–3.1)

## 2019-02-24 LAB — IRON AND TIBC
Iron: 41 ug/dL — ABNORMAL LOW (ref 45–182)
Saturation Ratios: 12 % — ABNORMAL LOW (ref 17.9–39.5)
TIBC: 347 ug/dL (ref 250–450)
UIBC: 306 ug/dL

## 2019-02-24 LAB — GLUCOSE, CAPILLARY
Glucose-Capillary: 102 mg/dL — ABNORMAL HIGH (ref 70–99)
Glucose-Capillary: 143 mg/dL — ABNORMAL HIGH (ref 70–99)
Glucose-Capillary: 123 mg/dL — ABNORMAL HIGH (ref 70–99)
Glucose-Capillary: 130 mg/dL — ABNORMAL HIGH (ref 70–99)
Glucose-Capillary: 138 mg/dL — ABNORMAL HIGH (ref 70–99)

## 2019-02-24 LAB — ECHOCARDIOGRAM COMPLETE
Height: 72 in
Weight: 4972.8 oz

## 2019-02-24 LAB — FERRITIN: Ferritin: 158 ng/mL (ref 24–336)

## 2019-02-24 NOTE — Progress Notes (Signed)
RIGHT ANKLE--LATERAL MALEOLLUS

## 2019-02-24 NOTE — Progress Notes (Signed)
*  PRELIMINARY RESULTS* Echocardiogram 2D Echocardiogram has been performed.  Samuel Germany 02/24/2019, 12:22 PM

## 2019-02-24 NOTE — Progress Notes (Signed)
PROGRESS NOTE  Danny [name] MWU:[id number] DOB: May 29, 1956 DOA: 02/23/2019 PCP: Celene Squibb, MD  Brief History:  63 year old male with a history of hypertension, diabetes mellitus type 2, CKD stage III, psoriasis, venous stasis dermatitis, lymphedema presenting with 2-week history of dyspnea on exertion.  The patient states that he has had worsening lower extremity edema as well as increasing abdominal girth during this period of time.  He also describes some orthopnea type symptoms but denies any chest discomfort, fevers, chills, coughing, hemoptysis, nausea, vomiting, diarrhea, abdominal pain.  He endorses a 20 pound weight gain over the past month.  The patient states that he is supposed to be taking furosemide 40 mg daily, but he only takes 1 tablet (20 mg) daily.  The patient states that he quit smoking in 1988 after approximately 20-pack-year history. In the emergency department, the patient was afebrile hemodynamically stable with oxygen saturation 95% on room air.  Assessment/Plan: Acute CHF, type unspecified -Work-up in progress -Continue furosemide -Echocardiogram -The patient remains clinically fluid overloaded -Daily weights -Personally reviewed chest x-ray--increased interstitial markings, vascular congestion  Acute on chronic renal failure--CKD 3 -Suspect this may be progression of the patient's underlying kidney disease -Previous baseline creatinine of 1.8-1.9 -Monitor with diuresis -Renal ultrasound  Essential hypertension -Continue metoprolol succinate and amlodipine -Discontinue lisinopril in the setting of worsening renal function  Diabetes mellitus type 2, uncontrolled with hyperglycemia -Hemoglobin A1c -Holding glipizide -Continue reduced dose 70/30 insulin -NovoLog sliding scale  Anemia of chronic disease -FOBT negative -Iron saturation 12%, ferritin 158 -Ferrlecit x1 -Serum V25, folic acid  Hyperlipidemia -Continue statin -Lipid panel in the  morning  Right ankle wound -Wound care consult -See pictures in medical record  Venous stasis dermatitis -Hold off any antibiotics at this time -Monitor clinically -Patient had difficulty tolerating Unna boots in the past  Psoriasis -follows dermatology at [facility] -last dose Siliq 02/20/19      Disposition Plan:   Home in 2-3 days  Family Communication:  No Family at bedside  Consultants:  none  Code Status:  FULL  DVT Prophylaxis:  [place] Lovenox   Procedures: As Listed in Progress Note Above  Antibiotics: None      Subjective: Patient denies fevers, chills, headache, chest pain, dyspnea, nausea, vomiting, diarrhea, abdominal pain, dysuria, hematuria, hematochezia, and melena. He still c/o some orthopnea albeit a little better.    Objective: Vitals:   02/23/19 2039 02/23/19 2047 02/24/19 0600 02/24/19 0608  BP:  (!) 159/79  (!) 146/51  Pulse:  62  70  Resp:  (!) 22  (!) 23  Temp:  (!) 97.5 F (36.4 C)  97.7 F (36.5 C)  TempSrc:  Oral  Oral  SpO2:  96%  99%  Weight:  (!) 143 kg (!) 141 kg   Height: 6' (1.829 m)       Intake/Output Summary (Last 24 hours) at 02/24/2019 0855 Last data filed at 02/24/2019 0602 Gross per 24 hour  Intake 120 ml  Output 1100 ml  Net -980 ml   Weight change:  Exam:   General:  Pt is alert, follows commands appropriately, not in acute distress  HEENT: No icterus, No thrush, No neck mass, [place]/AT  Cardiovascular: RRR, S1/S2, no rubs, no gallops  Respiratory: bibasilar crackles, no wheeze  Abdomen: Soft/+BS, non tender, non distended, no guarding  Extremities: 2 + edema with venous stasis changes.  See pictures  Data Reviewed: I have personally reviewed following labs and imaging studies Basic Metabolic Panel: Recent Labs  Lab 02/23/19 1440 02/24/19 0434  NA 139 141  K 4.1 4.1  CL 108 110  CO2 22 23  GLUCOSE 70 100*  BUN 43* 46*  CREATININE 2.86* 2.92*  CALCIUM 8.6* 8.7*   Liver Function  Tests: Recent Labs  Lab 02/23/19 1714  AST 20  ALT 28  ALKPHOS 116  BILITOT 1.2  PROT 7.1  ALBUMIN 2.9*   No results for input(s): LIPASE, AMYLASE in the last 168 hours. No results for input(s): AMMONIA in the last 168 hours. Coagulation Profile: No results for input(s): INR, PROTIME in the last 168 hours. CBC: Recent Labs  Lab 02/23/19 1440 02/24/19 0434  WBC 9.4 10.4  NEUTROABS 7.3  --   HGB 8.8* 8.7*  HCT 28.2* 27.8*  MCV 84.9 85.5  PLT 219 235   Cardiac Enzymes: No results for input(s): CKTOTAL, CKMB, CKMBINDEX, TROPONINI in the last 168 hours. BNP: Invalid input(s): POCBNP CBG: Recent Labs  Lab 02/23/19 1809 02/23/19 2248 02/24/19 0807  GLUCAP 80 150* 102*   HbA1C: No results for input(s): HGBA1C in the last 72 hours. Urine analysis:    Component Value Date/Time   COLORURINE YELLOW 02/23/2019 1531   APPEARANCEUR CLEAR 02/23/2019 1531   LABSPEC 1.012 02/23/2019 1531   PHURINE 6.0 02/23/2019 1531   GLUCOSEU 50 (A) 02/23/2019 1531   HGBUR NEGATIVE 02/23/2019 1531   BILIRUBINUR NEGATIVE 02/23/2019 1531   KETONESUR NEGATIVE 02/23/2019 1531   PROTEINUR 100 (A) 02/23/2019 1531   NITRITE NEGATIVE 02/23/2019 1531   LEUKOCYTESUR NEGATIVE 02/23/2019 1531   Sepsis Labs: @LABRCNTIP (procalcitonin:4,lacticidven:4) ) Recent Results (from the past 240 hour(s))  SARS Coronavirus 2 [facility] order, Performed in [facility] hospital lab) Nasopharyngeal Nasopharyngeal Swab     Status: None   Collection Time: 02/23/19  4:01 PM   Specimen: Nasopharyngeal Swab  Result Value Ref Range Status   SARS Coronavirus 2 NEGATIVE NEGATIVE Final    Comment: (NOTE) If result is NEGATIVE SARS-CoV-2 target nucleic acids are NOT DETECTED. The SARS-CoV-2 RNA is generally detectable in upper and lower  respiratory specimens during the acute phase of infection. The lowest  concentration of SARS-CoV-2 viral copies this assay can detect is 250  copies / mL. A negative result does not  preclude SARS-CoV-2 infection  and should not be used as the sole basis for treatment or other  patient management decisions.  A negative result may occur with  improper specimen collection / handling, submission of specimen other  than nasopharyngeal swab, presence of viral mutation(s) within the  areas targeted by this assay, and inadequate number of viral copies  (<250 copies / mL). A negative result must be combined with clinical  observations, patient history, and epidemiological information. If result is POSITIVE SARS-CoV-2 target nucleic acids are DETECTED. The SARS-CoV-2 RNA is generally detectable in upper and lower  respiratory specimens dur ing the acute phase of infection.  Positive  results are indicative of active infection with SARS-CoV-2.  Clinical  correlation with patient history and other diagnostic information is  necessary to determine patient infection status.  Positive results do  not rule out bacterial infection or co-infection with other viruses. If result is PRESUMPTIVE POSTIVE SARS-CoV-2 nucleic acids MAY BE PRESENT.   A presumptive positive result was obtained on the submitted specimen  and confirmed on repeat testing.  While 2019 novel coronavirus  (SARS-CoV-2) nucleic acids may be present in the submitted  sample  additional confirmatory testing may be necessary for epidemiological  and / or clinical management purposes  to differentiate between  SARS-CoV-2 and other Sarbecovirus currently known to infect humans.  If clinically indicated additional testing with an alternate test  methodology [phone]) is advised. The SARS-CoV-2 RNA is generally  detectable in upper and lower respiratory sp ecimens during the acute  phase of infection. The expected result is Negative. Fact Sheet for Patients:  StrictlyIdeas.no Fact Sheet for Healthcare Providers: BankingDealers.co.za This test is not yet approved or cleared by  the [place] FDA and has been authorized for detection and/or diagnosis of SARS-CoV-2 by FDA under an Emergency Use Authorization (EUA).  This EUA will remain in effect (meaning this test can be used) for the duration of the COVID-19 declaration under Section 564(b)(1) of the Act, 21 U.S.C. section 360bbb-3(b)(1), unless the authorization is terminated or revoked sooner. Performed at [facility], [address], [place], [place] [address]      Scheduled Meds:  allopurinol  300 mg Oral Daily   amLODipine  10 mg Oral QHS   enoxaparin (LOVENOX) injection  30 mg Subcutaneous Q24H   furosemide  40 mg Intravenous Q12H   insulin aspart  0-9 Units Subcutaneous TID WC   insulin aspart protamine- aspart  20 Units Subcutaneous BID WC   metoprolol succinate  100 mg Oral QPM   Continuous Infusions:  Procedures/Studies: Dg Chest 2 View  Result Date: 02/23/2019 CLINICAL DATA:  Shortness of breath with exertion for several weeks. EXAM: CHEST - 2 VIEW COMPARISON:  None. FINDINGS: Thin accounting for the AP upright projection there is mild to moderate enlargement of the cardiopericardial silhouette. Blunted posterior costophrenic angles favoring small bilateral pleural effusions with passive atelectasis. No edema is observed. Mild thoracic spondylosis. IMPRESSION: 1. Mild to moderate enlargement of the cardiopericardial silhouette with suspected small bilateral pleural effusions but no current edema. Electronically Signed   By: Van Clines M.D.   On: 02/23/2019 15:00   Ct Maxillofacial Wo Contrast  Result Date: 02/04/2019 CLINICAL DATA:  63 year old male with chronic sinusitis. EXAM: CT MAXILLOFACIAL WITHOUT CONTRAST TECHNIQUE: Multidetector CT images of the paranasal sinuses were obtained using the standard protocol without intravenous contrast. COMPARISON:  None. FINDINGS: Paranasal sinuses: Frontal: Mildly hyperplastic. Trace mucosal thickening, otherwise normally aerated. Patent  frontal sinus drainage pathways. Ethmoid: Trace anterior ethmoid mucosal thickening, otherwise normally aerated. Maxillary: Normally aerated. Sphenoid: Mildly hyperplastic. Trace mucosal thickening on the right, otherwise normally aerated. Patent sphenoethmoidal recesses. Right ostiomeatal unit: Patent (coronal image 22). Left ostiomeatal unit: Patent (same image). Nasal passages: Mostly rightward nasal septal deviation. Intact septum. Symmetric nasal cavity mucosal thickening (coronal image 28). No retained secretions. Pneumatized olfactory recesses. Anatomy: Mildly hyperplastic sphenoid sinuses with marginal left anterior clinoid process pneumatization. Anterior ethmoidal artery position suspected on coronal images 26 and 27 with some pneumatization superior to both notches. Keros type 2 olfactory fossa. Other: Calcified atherosclerosis at the skull base. Negative for age visible noncontrast brain parenchyma. Negative orbits soft tissues. Visualized scalp soft tissues are within normal limits. Negative visible noncontrast deep soft tissue spaces of the face. Tympanic cavities and mastoids are clear. Maxillary dentition might be absent. No acute osseous abnormality identified. IMPRESSION: 1. Trace mucosal thickening but otherwise normally aerated paranasal sinuses and patent sinus drainage pathways. 2. Mildly hyperplastic frontal and sphenoid sinuses. 3. Symmetric nasal cavity mucosal thickening raising the possibility of rhinitis. Mild rightward septal deviation. Electronically Signed   By: Herminio Heads.D.  On: 02/04/2019 00:37    Orson Eva, DO  [facility] Pager [phone]  If 7PM-7AM, please contact night-coverage [email or web address] Password [email or web address] 02/24/2019, 8:55 AM   LOS: 1 day

## 2019-02-24 NOTE — Consult Note (Signed)
[facility] Nurse wound consult note Consultation was completed by review of records, images and assistance from the bedside nurse/clinical staff.  Reason for Consult: right ankle wound  Wound type: venous stasis in the presence of lymphedema and neuropathy  Pressure Injury POA: NA Measurement: 3cm x 3cm x 0.3cm (per nursing notes and review of images Wound HKI:SNGX, non granular  Drainage (amount, consistency, odor) reported yellow; minimal (per nursing flow sheet) Periwound: intact, hyperkeratosis related to chronic nonhealing wound  Dressing procedure/placement/frequency: Silver hydrofiber for exudate and recalcitrant wound bed. Change daily. Cover with dry dressing or foam    Re consult if needed, will not follow at this time. Thanks  Cyndy Braver [employer], RN,CWOCN, CNS, [place] [phone])

## 2019-02-25 DIAGNOSIS — I5031 Acute diastolic (congestive) heart failure: Secondary | ICD-10-CM

## 2019-02-25 DIAGNOSIS — I5032 Chronic diastolic (congestive) heart failure: Secondary | ICD-10-CM | POA: Diagnosis present

## 2019-02-25 DIAGNOSIS — J9601 Acute respiratory failure with hypoxia: Secondary | ICD-10-CM

## 2019-02-25 DIAGNOSIS — I5033 Acute on chronic diastolic (congestive) heart failure: Secondary | ICD-10-CM | POA: Diagnosis present

## 2019-02-25 LAB — GLUCOSE, CAPILLARY
Glucose-Capillary: 117 mg/dL — ABNORMAL HIGH (ref 70–99)
Glucose-Capillary: 124 mg/dL — ABNORMAL HIGH (ref 70–99)
Glucose-Capillary: 136 mg/dL — ABNORMAL HIGH (ref 70–99)
Glucose-Capillary: 146 mg/dL — ABNORMAL HIGH (ref 70–99)

## 2019-02-25 LAB — PROTEIN / CREATININE RATIO, URINE
Creatinine, Urine: 68.59 mg/dL
Protein Creatinine Ratio: 2.19 mg/mg{Cre} — ABNORMAL HIGH (ref 0.00–0.15)
Total Protein, Urine: 150 mg/dL

## 2019-02-25 LAB — HEMOGLOBIN A1C
Hgb A1c MFr Bld: 7.1 % — ABNORMAL HIGH (ref 4.8–5.6)
Mean Plasma Glucose: 157.07 mg/dL

## 2019-02-25 LAB — CREATININE, URINE, RANDOM: Creatinine, Urine: 60.56 mg/dL

## 2019-02-25 LAB — BASIC METABOLIC PANEL
Anion gap: 9 (ref 5–15)
BUN: 50 mg/dL — ABNORMAL HIGH (ref 8–23)
CO2: 24 mmol/L (ref 22–32)
Calcium: 8.4 mg/dL — ABNORMAL LOW (ref 8.9–10.3)
Chloride: 108 mmol/L (ref 98–111)
Creatinine, Ser: 3.38 mg/dL — ABNORMAL HIGH (ref 0.61–1.24)
GFR calc Af Amer: 21 mL/min — ABNORMAL LOW (ref >60)
GFR calc non Af Amer: 18 mL/min — ABNORMAL LOW (ref >60)
Glucose, Bld: 113 mg/dL — ABNORMAL HIGH (ref 70–99)
Potassium: 4.4 mmol/L (ref 3.5–5.1)
Sodium: 141 mmol/L (ref 135–145)

## 2019-02-25 LAB — HIV ANTIBODY (ROUTINE TESTING W REFLEX): HIV Screen 4th Generation wRfx: NONREACTIVE

## 2019-02-25 LAB — SODIUM, URINE, RANDOM: Sodium, Ur: 66 mmol/L

## 2019-02-25 MED ORDER — SODIUM CHLORIDE 0.9 % IV SOLN
510.0000 mg | INTRAVENOUS | Status: DC
  Administered 2019-02-25: 510 mg via INTRAVENOUS
  Filled 2019-02-25: qty 17

## 2019-02-25 MED ORDER — ENOXAPARIN SODIUM 80 MG/0.8ML ~~LOC~~ SOLN
70.0000 mg | SUBCUTANEOUS | Status: DC
  Administered 2019-02-25 – 2019-02-26 (×2): 70 mg via SUBCUTANEOUS
  Filled 2019-02-25 (×2): qty 0.8

## 2019-02-25 MED ORDER — SODIUM CHLORIDE 0.9 % IV SOLN
125.0000 mg | Freq: Once | INTRAVENOUS | Status: AC
  Administered 2019-02-25: 125 mg via INTRAVENOUS
  Filled 2019-02-25: qty 10

## 2019-02-25 NOTE — Consult Note (Signed)
Reason for Consult: AKI/CKD stage 4 Referring Physician: Tat, MD  Danny [name] is an 63 y.o. male.  HPI: Mr Mester has a PMH significant for longstanding DM complicated by neuropathy, nephropathy, HTN, obesity, and CKD stage 4 who presented to [facility] ED on 02/23/19 with a 2 week history of worsening DOE, abdominal bloating, and lower extremity edema along with a 20lb weight gain.  In the ED he was noted to be hypoxemic, Hgb of 8.8, Cr of 2.86, CXR with bilateral pleural effusions and was admitted for IV diuresis for new onset CHF.  We were consulted to further evaluate the development of AKI/CKD.  The trend in Scr is seen below.  Of note, he had been taking lisinopril, HCTZ, as well as NSAIDs prior to admission.  He also has a strong family history for CKD (his mother and oldest brother).   Trend in Creatinine: Creatinine, Ser  Date/Time Value Ref Range Status  02/25/2019 04:52 AM 3.38 (H) 0.61 - 1.24 mg/dL Final  02/24/2019 04:34 AM 2.92 (H) 0.61 - 1.24 mg/dL Final  02/23/2019 02:40 PM 2.86 (H) 0.61 - 1.24 mg/dL Final  01/20/2018 02:02 PM 1.95 (H) 0.61 - 1.24 mg/dL Final  12/26/2017 08:56 AM 1.92 (H) 0.61 - 1.24 mg/dL Final    PMH:   Past Medical History:  Diagnosis Date  . Arthritis   . Diabetes mellitus without complication ([place])   . Foot ulcer ([place])   . Hypertension     PSH:   Past Surgical History:  Procedure Laterality Date  . ANKLE SURGERY    . CHOLECYSTECTOMY    . FOOT SURGERY      Allergies:  Allergies  Allergen Reactions  . Dust Mite Extract Itching and Other (See Comments)    Unknown reaction-potential shortness of breath  . Prednisone Nausea And Vomiting  . Rocephin [Ceftriaxone] Nausea And Vomiting    Medications:   Prior to Admission medications   Medication Sig Start Date End Date Taking? Authorizing Provider  allopurinol (ZYLOPRIM) 300 MG tablet Take 1 tablet by mouth daily. 01/10/18  Yes [provider]  amLODipine (NORVASC) 10 MG tablet Take 10 mg  by mouth at bedtime. 11/27/18  Yes [provider]  Brodalumab 210 MG/1.5ML SOSY Inject 210 mg into the skin every 14 (fourteen) days. 12/25/17  Yes [provider]  Colloidal Oatmeal (ECZEMA MOISTURIZING EX) Apply 1 application topically daily as needed (applied to legs).   Yes [provider]  furosemide (LASIX) 20 MG tablet Take 20 mg by mouth daily.  01/18/19  Yes [provider]  glipiZIDE (GLUCOTROL) 10 MG tablet Take 10 mg by mouth 2 (two) times daily before a meal.  01/29/19  Yes [provider]  lisinopril (PRINIVIL,ZESTRIL) 20 MG tablet Take 20 mg by mouth 2 (two) times daily.  11/05/17  Yes [provider]  metoprolol succinate (TOPROL-XL) 100 MG 24 hr tablet Take 100 mg by mouth every evening.  12/17/17  Yes [provider]  NOVOLIN 70/30 RELION (70-30) 100 UNIT/ML injection Inject 10-80 Units into the skin 3 (three) times daily with meals. 10-20 units for blood sugar levels around 150 and up to 80 units for blood sugar levels at 300 or more 12/20/17  Yes [provider]  pravastatin (PRAVACHOL) 80 MG tablet Take 80 mg by mouth every evening.  01/10/18  Yes [provider]    Inpatient medications: . allopurinol  300 mg Oral Daily  . amLODipine  10 mg Oral QHS  . enoxaparin (  LOVENOX) injection  70 mg Subcutaneous Q24H  . furosemide  40 mg Intravenous Q12H  . insulin aspart  0-9 Units Subcutaneous TID WC  . insulin aspart protamine- aspart  20 Units Subcutaneous BID WC  . metoprolol succinate  100 mg Oral QPM    Discontinued Meds:   Medications Discontinued During This Encounter  Medication Reason  . sodium chloride flush (NS) 0.9 % injection 3 mL   . potassium chloride SA (K-DUR) CR tablet 20 mEq   . glycopyrrolate (ROBINUL) 2 MG tablet Discontinued by provider  . hydrochlorothiazide (HYDRODIURIL) 25 MG tablet Discontinued by provider  . verapamil (CALAN) 120 MG tablet Discontinued by provider  . augmented  betamethasone dipropionate (DIPROLENE-AF) 0.05 % cream Change in therapy  . enoxaparin (LOVENOX) injection 30 mg     Social History:  reports that he has never smoked. He has never used smokeless tobacco. He reports that he does not drink alcohol or use drugs.  Family History:  History reviewed. No pertinent family history.  Pertinent items are noted in HPI. Weight change: -1.4 kg  Intake/Output Summary (Last 24 hours) at 02/25/2019 1355 Last data filed at 02/25/2019 0422 Gross per 24 hour  Intake -  Output 3400 ml  Net -3400 ml   BP 133/70 (BP Location: Left Arm)   Pulse 70   Temp 99.3 F (37.4 C) (Oral)   Resp 20   Ht 6' (1.829 m)   Wt (!) 140.6 kg   SpO2 98%   BMI 42.04 kg/m  Vitals:   02/24/19 2003 02/24/19 2056 02/25/19 0422 02/25/19 0901  BP:  (!) 143/65 133/70   Pulse:  (!) 59 70   Resp:  16 20   Temp:  98.6 F (37 C) 99.3 F (37.4 C)   TempSrc:  Oral Oral   SpO2: (!) 84% 96% 97% 98%  Weight:   (!) 140.6 kg   Height:         General appearance: alert, cooperative, no distress and moderately obese Head: Normocephalic, without obvious abnormality, atraumatic Eyes: negative findings: lids and lashes normal, conjunctivae and sclerae normal and corneas clear Resp: rales bibasilar Cardio: regular rate and rhythm, S1, S2 normal, no murmur, click, rub or gallop GI: Obese, +BS, soft, NT Extremities: edema 1+ bilateral lower extremities and chronic venous stasis changes  Labs: Basic Metabolic Panel: Recent Labs  Lab 02/23/19 1440 02/23/19 1714 02/24/19 0434 02/25/19 0452  NA 139  --  141 141  K 4.1  --  4.1 4.4  CL 108  --  110 108  CO2 22  --  23 24  GLUCOSE 70  --  100* 113*  BUN 43*  --  46* 50*  CREATININE 2.86*  --  2.92* 3.38*  ALBUMIN  --  2.9*  --   --   CALCIUM 8.6*  --  8.7* 8.4*   Liver Function Tests: Recent Labs  Lab 02/23/19 1714  AST 20  ALT 28  ALKPHOS 116  BILITOT 1.2  PROT 7.1  ALBUMIN 2.9*   No results for input(s): LIPASE,  AMYLASE in the last 168 hours. No results for input(s): AMMONIA in the last 168 hours. CBC: Recent Labs  Lab 02/23/19 1440 02/24/19 0434  WBC 9.4 10.4  NEUTROABS 7.3  --   HGB 8.8* 8.7*  HCT 28.2* 27.8*  MCV 84.9 85.5  PLT 219 235   PT/INR: @LABRCNTIP (inr:5) Cardiac Enzymes: )No results for input(s): CKTOTAL, CKMB, CKMBINDEX, TROPONINI in the last 168 hours. CBG: Recent Labs  Lab 02/24/19 1639 02/24/19 2048 02/24/19 2057 02/25/19 0734 02/25/19 1140  GLUCAP 123* 138* 130* 117* 124*    Iron Studies:  Recent Labs  Lab 02/24/19 0434  IRON 41*  TIBC 347  FERRITIN 158    Xrays/Other Studies: Dg Chest 2 View  Result Date: 02/23/2019 CLINICAL DATA:  Shortness of breath with exertion for several weeks. EXAM: CHEST - 2 VIEW COMPARISON:  None. FINDINGS: Thin accounting for the AP upright projection there is mild to moderate enlargement of the cardiopericardial silhouette. Blunted posterior costophrenic angles favoring small bilateral pleural effusions with passive atelectasis. No edema is observed. Mild thoracic spondylosis. IMPRESSION: 1. Mild to moderate enlargement of the cardiopericardial silhouette with suspected small bilateral pleural effusions but no current edema. Electronically Signed   By: Van Clines M.D.   On: 02/23/2019 15:00   US Renal  Result Date: 02/24/2019 CLINICAL DATA:  Acute on chronic renal failure EXAM: RENAL / URINARY TRACT ULTRASOUND COMPLETE COMPARISON:  August 30, 2018 FINDINGS: Right Kidney: Renal measurements: 12.2 x 6.3 x 6.5 cm = volume: 262 mL. Again noted is a anechoic mass which is partially exophytic in the midpole of the right kidney measuring 2.2 x 2.4 by 2.4 cm. There is slightly increased cortical echogenicity and thinning. There is perinephric stranding seen around the right kidney. Left Kidney: Renal measurements: 14.0 x 6.3 x 5.8 cm = volume: 269 mL. There is slightly increased cortical echogenicity and thinning. There is a small  amount of perinephric stranding/fluid seen. Bladder: Bladder is partially distended. IMPRESSION: Findings suggestive of chronic medical renal disease. Partially exophytic mid right kidney simple cyst. Small amount of bilateral perinephric stranding and left perinephric fluid. Electronically Signed   By: Prudencio Pair M.D.   On: 02/24/2019 09:36     Assessment/Plan: 1.  AKI/CKD stage 4- in setting of decompensated CHF, NSAID use with concomitant ACE inhibition.  His lisinopril was discontinued and he has diuresed well.  REnal US consistent with chronic medical renal disease.  We discussed the ways to delay the progression of ckd with  1. Tight BP control (goal <130/80) 2. Tight glucose control (goal Hgb A1c <7%) 3. Use of an ACE/ARB (currently on hold due to AKI) 4. Avoidance of NSAIDS/Cox-II I"s 5. No indication for dialysis, however he will need close follow up and education as he has had progressive CKD over the past 3 years. 2. Acute diastolic CHF/anasarca- diuresing well with lasix 40 mg IV bid, consider Cardiology evaluation given new onset CHF 3. Anemia of CKD- as well as low iron stores.  Will dose with IV iron and follow, however he may need ESA 4. HTN- stable 5. DM - per primary svc 6. Obesity- stressed importance of diet/weight loss 7. HLD- on statin 8. Chronic stasis dermatitis- cont with diuretics and agree with Louretta Parma boots if tolerates   Broadus John A Lashaya Kienitz 02/25/2019, 1:55 PM

## 2019-02-25 NOTE — Progress Notes (Signed)
PROGRESS NOTE  Danny [name] WLS:[id number] DOB: 04-Nov-1955 DOA: 02/23/2019 PCP: Celene Squibb, MD  Brief History:  63 year old male with a history of hypertension, diabetes mellitus type 2, CKD stage III, psoriasis, venous stasis dermatitis, lymphedema presenting with 2-week history of dyspnea on exertion.  The patient states that he has had worsening lower extremity edema as well as increasing abdominal girth during this period of time.  He also describes some orthopnea type symptoms but denies any chest discomfort, fevers, chills, coughing, hemoptysis, nausea, vomiting, diarrhea, abdominal pain.  He endorses a 20 pound weight gain over the past month.  The patient states that he is supposed to be taking furosemide 40 mg daily, but he only takes 1 tablet (20 mg) daily.  The patient states that he quit smoking in 1988 after approximately 20-pack-year history.  He endorses dietary indiscretions at home. In the emergency department, the patient was afebrile hemodynamically stable with oxygen saturation 95% on room air.  Assessment/Plan: Acute Diastolic CHF/Anasarca/Fluid Overload -in part due to progression of underlying CKD -Continue furosemide IV 40 mg bid -Echo--EF >65%, no WMA -The patient remains clinically fluid overloaded -Daily weights  313>>>309.9 lbs -Personally reviewed chest x-ray--increased interstitial markings, vascular congestion  Acute on chronic renal failure--CKD 3 -pt has progression of underlying kidney disease -Previous baseline creatinine of 1.8-1.9 -Monitor with diuresis -Renal ultrasound--medical renal disease, small amount bilateral perinephric stranding -check urine protein/creatinine ratio  Acute respiratory failure with hypoxia -stable on 3L -due to fluid overload in setting of OSA/OHS -wean oxygen as tolerated for saturation >92%  Essential hypertension -Continue metoprolol succinate and amlodipine -Discontinue lisinopril in the setting of  worsening renal function  Diabetes mellitus type 2, uncontrolled with hyperglycemia -Hemoglobin A1c--7.1 -Holding glipizide -Continue reduced dose 70/30 insulin -NovoLog sliding scale  Anemia of chronic disease/CKD -FOBT negative -Iron saturation 12%, ferritin 158 -Ferrlecit x1 -Serum O11, folic acid  Hyperlipidemia -Continue statin -Lipid panel in the morning  Right ankle wound -Wound care consult appreciated -See pictures in medical record  Venous stasis dermatitis -Hold off any antibiotics at this time -Monitor clinically -Patient had difficulty tolerating Unna boots in the past  Psoriasis -follows dermatology at [facility] -last dose Siliq 02/20/19      Disposition Plan:   Home in 2-3 days  Family Communication:  No Family at bedside  Consultants:  nephrology  Code Status:  FULL  DVT Prophylaxis:  Cornelius Lovenox   Procedures: As Listed in Progress Note Above  Antibiotics: None      Subjective: Patient denies fevers, chills, headache, chest pain, dyspnea, nausea, vomiting, diarrhea, abdominal pain, dysuria, hematuria, hematochezia, and melena.   Objective: Vitals:   02/24/19 2003 02/24/19 2056 02/25/19 0422 02/25/19 0901  BP:  (!) 143/65 133/70   Pulse:  (!) 59 70   Resp:  16 20   Temp:  98.6 F (37 C) 99.3 F (37.4 C)   TempSrc:  Oral Oral   SpO2: (!) 84% 96% 97% 98%  Weight:   (!) 140.6 kg   Height:        Intake/Output Summary (Last 24 hours) at 02/25/2019 1056 Last data filed at 02/25/2019 0422 Gross per 24 hour  Intake --  Output 3400 ml  Net -3400 ml   Weight change: -1.4 kg Exam:   General:  Pt is alert, follows commands appropriately, not in acute distress  HEENT: No icterus, No thrush, No neck mass, [place]/AT  Cardiovascular: RRR, S1/S2, no rubs,  no gallops  Respiratory: bibasilar crackles, no wheeze  Abdomen: Soft/+BS, non tender, non distended, no guarding  Extremities: 1+ LE edema, No lymphangitis, No  petechiae, No rashes, no synovitis   Data Reviewed: I have personally reviewed following labs and imaging studies Basic Metabolic Panel: Recent Labs  Lab 02/23/19 1440 02/24/19 0434 02/25/19 0452  NA 139 141 141  K 4.1 4.1 4.4  CL 108 110 108  CO2 22 23 24   GLUCOSE 70 100* 113*  BUN 43* 46* 50*  CREATININE 2.86* 2.92* 3.38*  CALCIUM 8.6* 8.7* 8.4*   Liver Function Tests: Recent Labs  Lab 02/23/19 1714  AST 20  ALT 28  ALKPHOS 116  BILITOT 1.2  PROT 7.1  ALBUMIN 2.9*   No results for input(s): LIPASE, AMYLASE in the last 168 hours. No results for input(s): AMMONIA in the last 168 hours. Coagulation Profile: No results for input(s): INR, PROTIME in the last 168 hours. CBC: Recent Labs  Lab 02/23/19 1440 02/24/19 0434  WBC 9.4 10.4  NEUTROABS 7.3  --   HGB 8.8* 8.7*  HCT 28.2* 27.8*  MCV 84.9 85.5  PLT 219 235   Cardiac Enzymes: No results for input(s): CKTOTAL, CKMB, CKMBINDEX, TROPONINI in the last 168 hours. BNP: Invalid input(s): POCBNP CBG: Recent Labs  Lab 02/24/19 1152 02/24/19 1639 02/24/19 2048 02/24/19 2057 02/25/19 0734  GLUCAP 143* 123* 138* 130* 117*   HbA1C: Recent Labs    02/23/19 1434  HGBA1C 7.1*   Urine analysis:    Component Value Date/Time   COLORURINE YELLOW 02/23/2019 1531   APPEARANCEUR CLEAR 02/23/2019 1531   LABSPEC 1.012 02/23/2019 1531   PHURINE 6.0 02/23/2019 1531   GLUCOSEU 50 (A) 02/23/2019 1531   HGBUR NEGATIVE 02/23/2019 1531   [place] 02/23/2019 1531   KETONESUR NEGATIVE 02/23/2019 1531   PROTEINUR 100 (A) 02/23/2019 1531   NITRITE NEGATIVE 02/23/2019 1531   LEUKOCYTESUR NEGATIVE 02/23/2019 1531   Sepsis Labs: @LABRCNTIP (procalcitonin:4,lacticidven:4) ) Recent Results (from the past 240 hour(s))  SARS Coronavirus 2 [facility] order, Performed in [facility] hospital lab) Nasopharyngeal Nasopharyngeal Swab     Status: None   Collection Time: 02/23/19  4:01 PM   Specimen: Nasopharyngeal  Swab  Result Value Ref Range Status   SARS Coronavirus 2 NEGATIVE NEGATIVE Final    Comment: (NOTE) If result is NEGATIVE SARS-CoV-2 target nucleic acids are NOT DETECTED. The SARS-CoV-2 RNA is generally detectable in upper and lower  respiratory specimens during the acute phase of infection. The lowest  concentration of SARS-CoV-2 viral copies this assay can detect is 250  copies / mL. A negative result does not preclude SARS-CoV-2 infection  and should not be used as the sole basis for treatment or other  patient management decisions.  A negative result may occur with  improper specimen collection / handling, submission of specimen other  than nasopharyngeal swab, presence of viral mutation(s) within the  areas targeted by this assay, and inadequate number of viral copies  (<250 copies / mL). A negative result must be combined with clinical  observations, patient history, and epidemiological information. If result is POSITIVE SARS-CoV-2 target nucleic acids are DETECTED. The SARS-CoV-2 RNA is generally detectable in upper and lower  respiratory specimens dur ing the acute phase of infection.  Positive  results are indicative of active infection with SARS-CoV-2.  Clinical  correlation with patient history and other diagnostic information is  necessary to determine patient infection status.  Positive results do  not rule out bacterial  infection or co-infection with other viruses. If result is PRESUMPTIVE POSTIVE SARS-CoV-2 nucleic acids MAY BE PRESENT.   A presumptive positive result was obtained on the submitted specimen  and confirmed on repeat testing.  While 2019 novel coronavirus  (SARS-CoV-2) nucleic acids may be present in the submitted sample  additional confirmatory testing may be necessary for epidemiological  and / or clinical management purposes  to differentiate between  SARS-CoV-2 and other Sarbecovirus currently known to infect humans.  If clinically indicated  additional testing with an alternate test  methodology [phone]) is advised. The SARS-CoV-2 RNA is generally  detectable in upper and lower respiratory sp ecimens during the acute  phase of infection. The expected result is Negative. Fact Sheet for Patients:  StrictlyIdeas.no Fact Sheet for Healthcare Providers: BankingDealers.co.za This test is not yet approved or cleared by the [place] FDA and has been authorized for detection and/or diagnosis of SARS-CoV-2 by FDA under an Emergency Use Authorization (EUA).  This EUA will remain in effect (meaning this test can be used) for the duration of the COVID-19 declaration under Section 564(b)(1) of the Act, 21 U.S.C. section 360bbb-3(b)(1), unless the authorization is terminated or revoked sooner. Performed at [facility], [address] [place], [place] [address]      Scheduled Meds:  allopurinol  300 mg Oral Daily   amLODipine  10 mg Oral QHS   enoxaparin (LOVENOX) injection  70 mg Subcutaneous Q24H   furosemide  40 mg Intravenous Q12H   insulin aspart  0-9 Units Subcutaneous TID WC   insulin aspart protamine- aspart  20 Units Subcutaneous BID WC   metoprolol succinate  100 mg Oral QPM   Continuous Infusions:  Procedures/Studies: Dg Chest 2 View  Result Date: 02/23/2019 CLINICAL DATA:  Shortness of breath with exertion for several weeks. EXAM: CHEST - 2 VIEW COMPARISON:  None. FINDINGS: Thin accounting for the AP upright projection there is mild to moderate enlargement of the cardiopericardial silhouette. Blunted posterior costophrenic angles favoring small bilateral pleural effusions with passive atelectasis. No edema is observed. Mild thoracic spondylosis. IMPRESSION: 1. Mild to moderate enlargement of the cardiopericardial silhouette with suspected small bilateral pleural effusions but no current edema. Electronically Signed   By: Van Clines M.D.   On: 02/23/2019  15:00   US Renal  Result Date: 02/24/2019 CLINICAL DATA:  Acute on chronic renal failure EXAM: RENAL / URINARY TRACT ULTRASOUND COMPLETE COMPARISON:  August 30, 2018 FINDINGS: Right Kidney: Renal measurements: 12.2 x 6.3 x 6.5 cm = volume: 262 mL. Again noted is a anechoic mass which is partially exophytic in the midpole of the right kidney measuring 2.2 x 2.4 by 2.4 cm. There is slightly increased cortical echogenicity and thinning. There is perinephric stranding seen around the right kidney. Left Kidney: Renal measurements: 14.0 x 6.3 x 5.8 cm = volume: 269 mL. There is slightly increased cortical echogenicity and thinning. There is a small amount of perinephric stranding/fluid seen. Bladder: Bladder is partially distended. IMPRESSION: Findings suggestive of chronic medical renal disease. Partially exophytic mid right kidney simple cyst. Small amount of bilateral perinephric stranding and left perinephric fluid. Electronically Signed   By: Prudencio Pair M.D.   On: 02/24/2019 09:36   Ct Maxillofacial Wo Contrast  Result Date: 02/04/2019 CLINICAL DATA:  63 year old male with chronic sinusitis. EXAM: CT MAXILLOFACIAL WITHOUT CONTRAST TECHNIQUE: Multidetector CT images of the paranasal sinuses were obtained using the standard protocol without intravenous contrast. COMPARISON:  None. FINDINGS: Paranasal sinuses: Frontal: Mildly hyperplastic. Trace  mucosal thickening, otherwise normally aerated. Patent frontal sinus drainage pathways. Ethmoid: Trace anterior ethmoid mucosal thickening, otherwise normally aerated. Maxillary: Normally aerated. Sphenoid: Mildly hyperplastic. Trace mucosal thickening on the right, otherwise normally aerated. Patent sphenoethmoidal recesses. Right ostiomeatal unit: Patent (coronal image 22). Left ostiomeatal unit: Patent (same image). Nasal passages: Mostly rightward nasal septal deviation. Intact septum. Symmetric nasal cavity mucosal thickening (coronal image 28). No retained  secretions. Pneumatized olfactory recesses. Anatomy: Mildly hyperplastic sphenoid sinuses with marginal left anterior clinoid process pneumatization. Anterior ethmoidal artery position suspected on coronal images 26 and 27 with some pneumatization superior to both notches. Keros type 2 olfactory fossa. Other: Calcified atherosclerosis at the skull base. Negative for age visible noncontrast brain parenchyma. Negative orbits soft tissues. Visualized scalp soft tissues are within normal limits. Negative visible noncontrast deep soft tissue spaces of the face. Tympanic cavities and mastoids are clear. Maxillary dentition might be absent. No acute osseous abnormality identified. IMPRESSION: 1. Trace mucosal thickening but otherwise normally aerated paranasal sinuses and patent sinus drainage pathways. 2. Mildly hyperplastic frontal and sphenoid sinuses. 3. Symmetric nasal cavity mucosal thickening raising the possibility of rhinitis. Mild rightward septal deviation. Electronically Signed   By: Genevie Ann M.D.   On: 02/04/2019 00:37    Orson Eva, DO  [facility] Pager [phone]  If 7PM-7AM, please contact night-coverage [email or web address] Password [email or web address] 02/25/2019, 10:56 AM   LOS: 2 days

## 2019-02-26 LAB — GLUCOSE, CAPILLARY
Glucose-Capillary: 111 mg/dL — ABNORMAL HIGH (ref 70–99)
Glucose-Capillary: 130 mg/dL — ABNORMAL HIGH (ref 70–99)
Glucose-Capillary: 139 mg/dL — ABNORMAL HIGH (ref 70–99)
Glucose-Capillary: 129 mg/dL — ABNORMAL HIGH (ref 70–99)

## 2019-02-26 LAB — RENAL FUNCTION PANEL
Albumin: 2.7 g/dL — ABNORMAL LOW (ref 3.5–5.0)
Anion gap: 6 (ref 5–15)
BUN: 57 mg/dL — ABNORMAL HIGH (ref 8–23)
CO2: 24 mmol/L (ref 22–32)
Calcium: 7.9 mg/dL — ABNORMAL LOW (ref 8.9–10.3)
Chloride: 106 mmol/L (ref 98–111)
Creatinine, Ser: 3.69 mg/dL — ABNORMAL HIGH (ref 0.61–1.24)
GFR calc Af Amer: 19 mL/min — ABNORMAL LOW (ref >60)
GFR calc non Af Amer: 16 mL/min — ABNORMAL LOW (ref >60)
Glucose, Bld: 118 mg/dL — ABNORMAL HIGH (ref 70–99)
Phosphorus: 5.3 mg/dL — ABNORMAL HIGH (ref 2.5–4.6)
Potassium: 4.4 mmol/L (ref 3.5–5.1)
Sodium: 136 mmol/L (ref 135–145)

## 2019-02-26 LAB — PROTEIN ELECTROPHORESIS, SERUM
A/G Ratio: 0.8 (ref 0.7–1.7)
Albumin ELP: 3 g/dL (ref 2.9–4.4)
Alpha-1-Globulin: 0.2 g/dL (ref 0.0–0.4)
Alpha-2-Globulin: 0.9 g/dL (ref 0.4–1.0)
Beta Globulin: 1 g/dL (ref 0.7–1.3)
Gamma Globulin: 1.6 g/dL (ref 0.4–1.8)
Globulin, Total: 3.7 g/dL (ref 2.2–3.9)
Total Protein ELP: 6.7 g/dL (ref 6.0–8.5)

## 2019-02-26 LAB — CBC
HCT: 24.7 % — ABNORMAL LOW (ref 39.0–52.0)
Hemoglobin: 7.6 g/dL — ABNORMAL LOW (ref 13.0–17.0)
MCH: 27.1 pg (ref 26.0–34.0)
MCHC: 30.8 g/dL (ref 30.0–36.0)
MCV: 88.2 fL (ref 80.0–100.0)
Platelets: 193 10*3/uL (ref 150–400)
RBC: 2.8 MIL/uL — ABNORMAL LOW (ref 4.22–5.81)
RDW: 17.7 % — ABNORMAL HIGH (ref 11.5–15.5)
WBC: 9.3 10*3/uL (ref 4.0–10.5)
nRBC: 0.2 % (ref 0.0–0.2)

## 2019-02-26 LAB — LIPID PANEL
Cholesterol: 95 mg/dL (ref 0–200)
HDL: 26 mg/dL — ABNORMAL LOW (ref >40)
LDL Cholesterol: 46 mg/dL (ref 0–99)
Total CHOL/HDL Ratio: 3.7 RATIO
Triglycerides: 113 mg/dL (ref <150)
VLDL: 23 mg/dL (ref 0–40)

## 2019-02-26 LAB — VITAMIN B12: Vitamin B-12: 1273 pg/mL — ABNORMAL HIGH (ref 180–914)

## 2019-02-26 LAB — FOLATE: Folate: 19.5 ng/mL (ref >5.9)

## 2019-02-26 LAB — KAPPA/LAMBDA LIGHT CHAINS
Kappa free light chain: 134.9 mg/L — ABNORMAL HIGH (ref 3.3–19.4)
Kappa, lambda light chain ratio: 1.21 (ref 0.26–1.65)
Lambda free light chains: 111.6 mg/L — ABNORMAL HIGH (ref 5.7–26.3)

## 2019-02-26 MED ORDER — FUROSEMIDE 10 MG/ML IJ SOLN
40.0000 mg | Freq: Every day | INTRAMUSCULAR | Status: DC
  Administered 2019-02-26: 40 mg via INTRAVENOUS
  Filled 2019-02-26 (×2): qty 4

## 2019-02-26 NOTE — Progress Notes (Signed)
Patient ID: Danny [name], male   DOB: 1955/11/13, 63 y.o.   MRN: [id number]  S: no new complaints and doing better. O:BP (!) 136/54 (BP Location: Left Arm)   Pulse 65   Temp 99 F (37.2 C) (Oral)   Resp 18   Ht 6' (1.829 m)   Wt (!) 143.2 kg   SpO2 95%   BMI 42.82 kg/m   Intake/Output Summary (Last 24 hours) at 02/26/2019 1051 Last data filed at 02/26/2019 0600 Gross per 24 hour  Intake 240 ml  Output 1650 ml  Net -1410 ml   Intake/Output: I/O last 3 completed shifts: In: 240 [P.O.:240] Out: 1017 [Urine:3550]  Intake/Output this shift:  No intake/output data recorded. Weight change: 2.6 kg Gen: NAD CVS: no rub Resp: cta Abd: obese, +BS, soft, NT/ND Ext:1+ pretibial edema with chronic venous stasis changes.  Recent Labs  Lab 02/23/19 1440 02/23/19 1714 02/24/19 0434 02/25/19 0452 02/26/19 0418  NA 139  --  141 141 136  K 4.1  --  4.1 4.4 4.4  CL 108  --  110 108 106  CO2 22  --  23 24 24   GLUCOSE 70  --  100* 113* 118*  BUN 43*  --  46* 50* 57*  CREATININE 2.86*  --  2.92* 3.38* 3.69*  ALBUMIN  --  2.9*  --   --  2.7*  CALCIUM 8.6*  --  8.7* 8.4* 7.9*  PHOS  --   --   --   --  5.3*  AST  --  20  --   --   --   ALT  --  28  --   --   --    Liver Function Tests: Recent Labs  Lab 02/23/19 1714 02/26/19 0418  AST 20  --   ALT 28  --   ALKPHOS 116  --   BILITOT 1.2  --   PROT 7.1  --   ALBUMIN 2.9* 2.7*   No results for input(s): LIPASE, AMYLASE in the last 168 hours. No results for input(s): AMMONIA in the last 168 hours. CBC: Recent Labs  Lab 02/23/19 1440 02/24/19 0434 02/26/19 0418  WBC 9.4 10.4 9.3  NEUTROABS 7.3  --   --   HGB 8.8* 8.7* 7.6*  HCT 28.2* 27.8* 24.7*  MCV 84.9 85.5 88.2  PLT 219 235 193   Cardiac Enzymes: No results for input(s): CKTOTAL, CKMB, CKMBINDEX, TROPONINI in the last 168 hours. CBG: Recent Labs  Lab 02/25/19 0734 02/25/19 1140 02/25/19 1639 02/25/19 2148 02/26/19 0748  GLUCAP 117* 124* 136* 146* 111*    Iron  Studies:  Recent Labs    02/24/19 0434  IRON 41*  TIBC 347  FERRITIN 158   Studies/Results: No results found. Marland Kitchen allopurinol  300 mg Oral Daily  . amLODipine  10 mg Oral QHS  . enoxaparin (LOVENOX) injection  70 mg Subcutaneous Q24H  . furosemide  40 mg Intravenous Daily  . insulin aspart  0-9 Units Subcutaneous TID WC  . insulin aspart protamine- aspart  20 Units Subcutaneous BID WC  . metoprolol succinate  100 mg Oral QPM    BMET    Component Value Date/Time   NA 136 02/26/2019 0418   K 4.4 02/26/2019 0418   CL 106 02/26/2019 0418   CO2 24 02/26/2019 0418   GLUCOSE 118 (H) 02/26/2019 0418   BUN 57 (H) 02/26/2019 0418   CREATININE 3.69 (H) 02/26/2019 0418   CALCIUM 7.9 (L) 02/26/2019 5102  GFRNONAA 16 (L) 02/26/2019 0418   GFRAA 19 (L) 02/26/2019 0418   CBC    Component Value Date/Time   WBC 9.3 02/26/2019 0418   RBC 2.80 (L) 02/26/2019 0418   HGB 7.6 (L) 02/26/2019 0418   HCT 24.7 (L) 02/26/2019 0418   PLT 193 02/26/2019 0418   MCV 88.2 02/26/2019 0418   MCH 27.1 02/26/2019 0418   MCHC 30.8 02/26/2019 0418   RDW 17.7 (H) 02/26/2019 0418   LYMPHSABS 1.2 02/23/2019 1440   MONOABS 0.5 02/23/2019 1440   EOSABS 0.3 02/23/2019 1440   BASOSABS 0.0 02/23/2019 1440     Assessment/Plan: 1.  AKI/CKD stage 4- in setting of decompensated CHF, NSAID use with concomitant ACE inhibition.  His lisinopril was discontinued and he has diuresed well.  REnal US consistent with chronic medical renal disease.  We discussed the ways to delay the progression of ckd with  1. Tight BP control (goal <130/80) 2. Tight glucose control (goal Hgb A1c <7%) 3. Use of an ACE/ARB (currently on hold due to AKI) 4. Avoidance of NSAIDS/Cox-II I"s 5. No indication for dialysis, however he will need close follow up and education as he has had progressive CKD over the past 3 years. 6. Continue to hold lisinopril 2. Acute diastolic CHF/anasarca- diuresing well with lasix 40 mg IV bid, consider  Cardiology evaluation given new onset CHF.  Not sure if this is related to right sided CHF, progressive CKD, or nephrotic syndrome.  Had ordered 24 hour protein collection, however this was cancelled.  Will reorder and follow.   1. Can change to po torsemide tomorrow 20 mg bid or daily and follow response. 3. Anemia of CKD- as well as low iron stores.  Given dose of IV feraheme and follow, however he may need ESA as an outpatient.  Hgb down to 7.6 and may need transfusion if it continues to fall. 4. HTN- stable 5. DM - per primary svc 6. Obesity- stressed importance of diet/weight loss 7. HLD- on statin 8. Chronic stasis dermatitis- cont with diuretics and agree with Louretta Parma boots if tolerates  [name], MD [employer] [phone]

## 2019-02-26 NOTE — Progress Notes (Signed)
PROGRESS NOTE  Danny [name] FUX:[id number] DOB: Mar 28, 1956 DOA: 02/23/2019 PCP: Celene Squibb, MD  Brief History:  63 year old male with a history of hypertension, diabetes mellitus type 2, CKD stage III, psoriasis, venous stasis dermatitis, lymphedema presenting with 2-week history of dyspnea on exertion.  The patient states that he has had worsening lower extremity edema as well as increasing abdominal girth during this period of time.  He also describes some orthopnea type symptoms but denies any chest discomfort, fevers, chills, coughing, hemoptysis, nausea, vomiting, diarrhea, abdominal pain.  He endorses a 20 pound weight gain over the past month.  The patient states that he is supposed to be taking furosemide 40 mg daily, but he only takes 1 tablet (20 mg) daily.  The patient states that he quit smoking in 1988 after approximately 20-pack-year history.  He endorses dietary indiscretions at home. In the emergency department, the patient was afebrile hemodynamically stable with oxygen saturation 95% on room air.  Assessment/Plan: Acute Diastolic CHF/Anasarca/Fluid Overload -in part due to progression of underlying CKD -Continue furosemide IV 40 mg with plans to transition to oral torsemide 02/27/2019 per nephrology. -Echo--EF >65%, no WMA -The patient remains clinically fluid overloaded -Daily weights: Trending down with diuresis  Acute on chronic renal failure--CKD 3 -pt has progression of underlying kidney disease -Previous baseline creatinine of 1.8-1.9 -Monitor with diuresis -Renal ultrasound--medical renal disease, small amount bilateral perinephric stranding -check urine protein/creatinine ratio  Acute respiratory failure with hypoxia -stable on 3L -due to fluid overload in setting of OSA/OHS -wean oxygen as tolerated for saturation >92%  Essential hypertension -Continue metoprolol succinate and amlodipine -Discontinue lisinopril in the setting of worsening renal  function  Diabetes mellitus type 2, uncontrolled with hyperglycemia -Hemoglobin A1c--7.1 -Holding glipizide -Continue reduced dose 70/30 insulin -NovoLog sliding scale  CBG (last 3)  Recent Labs    02/25/19 2148 02/26/19 0748 02/26/19 1114  GLUCAP 146* 111* 130*    Anemia of chronic disease/CKD -FOBT negative -Iron saturation 12%, ferritin 158 -Ferrlecit x1 -Serum G25, folic acid  Hyperlipidemia -Continue statin -Lipid panel in the morning  Right ankle wound -Wound care consult appreciated -See pictures in medical record  Chronic Venous stasis dermatitis -Hold off any antibiotics at this time -Monitor clinically -Patient had difficulty tolerating Unna boots in the past  Psoriasis -[facility] -last dose Siliq 02/20/19  Disposition Plan:    Continue inpatient care for IV diuresis Family Communication:  No Family at bedside  Consultants:  nephrology  Code Status:  FULL  DVT Prophylaxis:  [place] Lovenox   Procedures: As Listed in Progress Note Above  Antibiotics: None  Subjective: Patient reports that he is urinating frequently and he does report that he is having less shortness of breath.  He denies chest pain.  He has been ambulating in the room.  Objective: Vitals:   02/25/19 2146 02/26/19 0500 02/26/19 0603 02/26/19 0808  BP: (!) 155/64  (!) 136/54   Pulse: 63  65   Resp: 18  18   Temp: 98.5 F (36.9 C)  99 F (37.2 C)   TempSrc: Oral  Oral   SpO2: 98%  92% 95%  Weight:  (!) 143.2 kg    Height:        Intake/Output Summary (Last 24 hours) at 02/26/2019 1314 Last data filed at 02/26/2019 1246 Gross per 24 hour  Intake 720 ml  Output 1650 ml  Net -930 ml   Weight change:  2.6 kg Exam:   General:  Pt is alert, follows commands appropriately, not in acute distress  HEENT: No icterus, No thrush, No neck mass, /AT  Cardiovascular: normal S1/S2, no rubs, no gallops  Respiratory: BBS, no wheeze, no crackles,  no rhonchi.   Abdomen: obese, Soft/+BS, non tender, non distended, no guarding  Extremities: 2+ BLE edema, chronic venous stasis dermatitis.  Data Reviewed: I have personally reviewed following labs and imaging studies Basic Metabolic Panel: Recent Labs  Lab 02/23/19 1440 02/24/19 0434 02/25/19 0452 02/26/19 0418  NA 139 141 141 136  K 4.1 4.1 4.4 4.4  CL 108 110 108 106  CO2 22 23 24 24   GLUCOSE 70 100* 113* 118*  BUN 43* 46* 50* 57*  CREATININE 2.86* 2.92* 3.38* 3.69*  CALCIUM 8.6* 8.7* 8.4* 7.9*  PHOS  --   --   --  5.3*   Liver Function Tests: Recent Labs  Lab 02/23/19 1714 02/26/19 0418  AST 20  --   ALT 28  --   ALKPHOS 116  --   BILITOT 1.2  --   PROT 7.1  --   ALBUMIN 2.9* 2.7*   No results for input(s): LIPASE, AMYLASE in the last 168 hours. No results for input(s): AMMONIA in the last 168 hours. Coagulation Profile: No results for input(s): INR, PROTIME in the last 168 hours. CBC: Recent Labs  Lab 02/23/19 1440 02/24/19 0434 02/26/19 0418  WBC 9.4 10.4 9.3  NEUTROABS 7.3  --   --   HGB 8.8* 8.7* 7.6*  HCT 28.2* 27.8* 24.7*  MCV 84.9 85.5 88.2  PLT 219 235 193   Cardiac Enzymes: No results for input(s): CKTOTAL, CKMB, CKMBINDEX, TROPONINI in the last 168 hours. BNP: Invalid input(s): POCBNP CBG: Recent Labs  Lab 02/25/19 1140 02/25/19 1639 02/25/19 2148 02/26/19 0748 02/26/19 1114  GLUCAP 124* 136* 146* 111* 130*   HbA1C: Recent Labs    02/23/19 1434 02/24/19 0434  HGBA1C 7.1* 7.1*   Urine analysis:    Component Value Date/Time   COLORURINE YELLOW 02/23/2019 1531   APPEARANCEUR CLEAR 02/23/2019 1531   LABSPEC 1.012 02/23/2019 1531   PHURINE 6.0 02/23/2019 1531   GLUCOSEU 50 (A) 02/23/2019 1531   HGBUR NEGATIVE 02/23/2019 1531   [place] 02/23/2019 1531   KETONESUR NEGATIVE 02/23/2019 1531   PROTEINUR 100 (A) 02/23/2019 1531   NITRITE NEGATIVE 02/23/2019 1531   LEUKOCYTESUR NEGATIVE 02/23/2019 1531   Sepsis  Labs: @LABRCNTIP (procalcitonin:4,lacticidven:4) ) Recent Results (from the past 240 hour(s))  SARS Coronavirus 2 [facility] order, Performed in [facility] hospital lab) Nasopharyngeal Nasopharyngeal Swab     Status: None   Collection Time: 02/23/19  4:01 PM   Specimen: Nasopharyngeal Swab  Result Value Ref Range Status   SARS Coronavirus 2 NEGATIVE NEGATIVE Final    Comment: (NOTE) If result is NEGATIVE SARS-CoV-2 target nucleic acids are NOT DETECTED. The SARS-CoV-2 RNA is generally detectable in upper and lower  respiratory specimens during the acute phase of infection. The lowest  concentration of SARS-CoV-2 viral copies this assay can detect is 250  copies / mL. A negative result does not preclude SARS-CoV-2 infection  and should not be used as the sole basis for treatment or other  patient management decisions.  A negative result may occur with  improper specimen collection / handling, submission of specimen other  than nasopharyngeal swab, presence of viral mutation(s) within the  areas targeted by this assay, and inadequate number of viral copies  (<250 copies /  mL). A negative result must be combined with clinical  observations, patient history, and epidemiological information. If result is POSITIVE SARS-CoV-2 target nucleic acids are DETECTED. The SARS-CoV-2 RNA is generally detectable in upper and lower  respiratory specimens dur ing the acute phase of infection.  Positive  results are indicative of active infection with SARS-CoV-2.  Clinical  correlation with patient history and other diagnostic information is  necessary to determine patient infection status.  Positive results do  not rule out bacterial infection or co-infection with other viruses. If result is PRESUMPTIVE POSTIVE SARS-CoV-2 nucleic acids MAY BE PRESENT.   A presumptive positive result was obtained on the submitted specimen  and confirmed on repeat testing.  While 2019 novel coronavirus  (SARS-CoV-2)  nucleic acids may be present in the submitted sample  additional confirmatory testing may be necessary for epidemiological  and / or clinical management purposes  to differentiate between  SARS-CoV-2 and other Sarbecovirus currently known to infect humans.  If clinically indicated additional testing with an alternate test  methodology [phone]) is advised. The SARS-CoV-2 RNA is generally  detectable in upper and lower respiratory sp ecimens during the acute  phase of infection. The expected result is Negative. Fact Sheet for Patients:  StrictlyIdeas.no Fact Sheet for Healthcare Providers: BankingDealers.co.za This test is not yet approved or cleared by the [place] FDA and has been authorized for detection and/or diagnosis of SARS-CoV-2 by FDA under an Emergency Use Authorization (EUA).  This EUA will remain in effect (meaning this test can be used) for the duration of the COVID-19 declaration under Section 564(b)(1) of the Act, 21 U.S.C. section 360bbb-3(b)(1), unless the authorization is terminated or revoked sooner. Performed at [facility], [address] [place], [place] [address]      Scheduled Meds: . allopurinol  300 mg Oral Daily  . amLODipine  10 mg Oral QHS  . enoxaparin (LOVENOX) injection  70 mg Subcutaneous Q24H  . furosemide  40 mg Intravenous Daily  . insulin aspart  0-9 Units Subcutaneous TID WC  . insulin aspart protamine- aspart  20 Units Subcutaneous BID WC  . metoprolol succinate  100 mg Oral QPM   Continuous Infusions:  Procedures/Studies: Dg Chest 2 View  Result Date: 02/23/2019 CLINICAL DATA:  Shortness of breath with exertion for several weeks. EXAM: CHEST - 2 VIEW COMPARISON:  None. FINDINGS: Thin accounting for the AP upright projection there is mild to moderate enlargement of the cardiopericardial silhouette. Blunted posterior costophrenic angles favoring small bilateral pleural effusions with  passive atelectasis. No edema is observed. Mild thoracic spondylosis. IMPRESSION: 1. Mild to moderate enlargement of the cardiopericardial silhouette with suspected small bilateral pleural effusions but no current edema. Electronically Signed   By: Van Clines M.D.   On: 02/23/2019 15:00   US Renal  Result Date: 02/24/2019 CLINICAL DATA:  Acute on chronic renal failure EXAM: RENAL / URINARY TRACT ULTRASOUND COMPLETE COMPARISON:  August 30, 2018 FINDINGS: Right Kidney: Renal measurements: 12.2 x 6.3 x 6.5 cm = volume: 262 mL. Again noted is a anechoic mass which is partially exophytic in the midpole of the right kidney measuring 2.2 x 2.4 by 2.4 cm. There is slightly increased cortical echogenicity and thinning. There is perinephric stranding seen around the right kidney. Left Kidney: Renal measurements: 14.0 x 6.3 x 5.8 cm = volume: 269 mL. There is slightly increased cortical echogenicity and thinning. There is a small amount of perinephric stranding/fluid seen. Bladder: Bladder is partially distended. IMPRESSION: Findings suggestive of  chronic medical renal disease. Partially exophytic mid right kidney simple cyst. Small amount of bilateral perinephric stranding and left perinephric fluid. Electronically Signed   By: Prudencio Pair M.D.   On: 02/24/2019 09:36   Ct Maxillofacial Wo Contrast  Result Date: 02/04/2019 CLINICAL DATA:  63 year old male with chronic sinusitis. EXAM: CT MAXILLOFACIAL WITHOUT CONTRAST TECHNIQUE: Multidetector CT images of the paranasal sinuses were obtained using the standard protocol without intravenous contrast. COMPARISON:  None. FINDINGS: Paranasal sinuses: Frontal: Mildly hyperplastic. Trace mucosal thickening, otherwise normally aerated. Patent frontal sinus drainage pathways. Ethmoid: Trace anterior ethmoid mucosal thickening, otherwise normally aerated. Maxillary: Normally aerated. Sphenoid: Mildly hyperplastic. Trace mucosal thickening on the right, otherwise normally  aerated. Patent sphenoethmoidal recesses. Right ostiomeatal unit: Patent (coronal image 22). Left ostiomeatal unit: Patent (same image). Nasal passages: Mostly rightward nasal septal deviation. Intact septum. Symmetric nasal cavity mucosal thickening (coronal image 28). No retained secretions. Pneumatized olfactory recesses. Anatomy: Mildly hyperplastic sphenoid sinuses with marginal left anterior clinoid process pneumatization. Anterior ethmoidal artery position suspected on coronal images 26 and 27 with some pneumatization superior to both notches. Keros type 2 olfactory fossa. Other: Calcified atherosclerosis at the skull base. Negative for age visible noncontrast brain parenchyma. Negative orbits soft tissues. Visualized scalp soft tissues are within normal limits. Negative visible noncontrast deep soft tissue spaces of the face. Tympanic cavities and mastoids are clear. Maxillary dentition might be absent. No acute osseous abnormality identified. IMPRESSION: 1. Trace mucosal thickening but otherwise normally aerated paranasal sinuses and patent sinus drainage pathways. 2. Mildly hyperplastic frontal and sphenoid sinuses. 3. Symmetric nasal cavity mucosal thickening raising the possibility of rhinitis. Mild rightward septal deviation. Electronically Signed   By: Genevie Ann M.D.   On: 02/04/2019 00:37    Najmo Pardue Wynetta Emery, MD  [facility] How to contact the [facility] Attending or Consulting provider [place] or covering provider during after hours [place], for this patient?  1. Check the care team in [facility] and look for a) attending/consulting [facility] provider listed and b) the [facility] team listed 2. Log into [email or web address] and use [employer]'s universal password to access. If you do not have the password, please contact the hospital operator. 3. Locate the [facility] provider you are looking for under [facility] and page to a number that you can be directly reached. 4. If you still have difficulty reaching the  provider, please page the [facility] (Director on Call) for the Hospitalists listed on amion for assistance.   If 7PM-7AM, please contact night-coverage [email or web address] Password [email or web address] 02/26/2019, 1:14 PM   LOS: 3 days

## 2019-02-26 NOTE — Care Management Important Message (Signed)
Important Message  Patient Details  Name: Danny [name] MRN: [id number] Date of Birth: [birth year]   Medicare Important Message Given:  Yes     [name] 02/26/2019, 1:43 PM

## 2019-02-26 NOTE — Plan of Care (Signed)

## 2019-02-27 ENCOUNTER — Encounter (HOSPITAL_COMMUNITY): Payer: Self-pay | Admitting: Radiology

## 2019-02-27 ENCOUNTER — Inpatient Hospital Stay (HOSPITAL_COMMUNITY): Payer: Medicare HMO

## 2019-02-27 LAB — RENAL FUNCTION PANEL
Albumin: 2.8 g/dL — ABNORMAL LOW (ref 3.5–5.0)
Anion gap: 8 (ref 5–15)
BUN: 57 mg/dL — ABNORMAL HIGH (ref 8–23)
CO2: 24 mmol/L (ref 22–32)
Calcium: 8.2 mg/dL — ABNORMAL LOW (ref 8.9–10.3)
Chloride: 103 mmol/L (ref 98–111)
Creatinine, Ser: 3.74 mg/dL — ABNORMAL HIGH (ref 0.61–1.24)
GFR calc Af Amer: 18 mL/min — ABNORMAL LOW (ref >60)
GFR calc non Af Amer: 16 mL/min — ABNORMAL LOW (ref >60)
Glucose, Bld: 174 mg/dL — ABNORMAL HIGH (ref 70–99)
Phosphorus: 4.8 mg/dL — ABNORMAL HIGH (ref 2.5–4.6)
Potassium: 4 mmol/L (ref 3.5–5.1)
Sodium: 135 mmol/L (ref 135–145)

## 2019-02-27 LAB — GLUCOSE, CAPILLARY
Glucose-Capillary: 139 mg/dL — ABNORMAL HIGH (ref 70–99)
Glucose-Capillary: 221 mg/dL — ABNORMAL HIGH (ref 70–99)
Glucose-Capillary: 221 mg/dL — ABNORMAL HIGH (ref 70–99)
Glucose-Capillary: 160 mg/dL — ABNORMAL HIGH (ref 70–99)

## 2019-02-27 LAB — PROTEIN, URINE, 24 HOUR
Collection Interval-UPROT: 24 hours
Protein, 24H Urine: 3050 mg/d — ABNORMAL HIGH (ref 50–100)
Urine Total Volume-UPROT: 2500 mL

## 2019-02-27 LAB — CBC
HCT: 25.7 % — ABNORMAL LOW (ref 39.0–52.0)
Hemoglobin: 8 g/dL — ABNORMAL LOW (ref 13.0–17.0)
MCH: 26.6 pg (ref 26.0–34.0)
MCHC: 31.1 g/dL (ref 30.0–36.0)
MCV: 85.4 fL (ref 80.0–100.0)
Platelets: 204 10*3/uL (ref 150–400)
RBC: 3.01 MIL/uL — ABNORMAL LOW (ref 4.22–5.81)
RDW: 17.7 % — ABNORMAL HIGH (ref 11.5–15.5)
WBC: 12.6 10*3/uL — ABNORMAL HIGH (ref 4.0–10.5)
nRBC: 0 % (ref 0.0–0.2)

## 2019-02-27 LAB — IMMUNOFIXATION, URINE

## 2019-02-27 MED ORDER — ALUM & MAG HYDROXIDE-SIMETH 200-200-20 MG/5ML PO SUSP
30.0000 mL | ORAL | Status: DC | PRN
  Administered 2019-02-27 – 2019-03-02 (×2): 30 mL via ORAL
  Filled 2019-02-27 (×2): qty 30

## 2019-02-27 MED ORDER — POLYETHYLENE GLYCOL 3350 17 G PO PACK
17.0000 g | PACK | Freq: Two times a day (BID) | ORAL | Status: DC
  Administered 2019-02-27 – 2019-02-28 (×3): 17 g via ORAL
  Filled 2019-02-27 (×2): qty 1

## 2019-02-27 MED ORDER — SENNOSIDES-DOCUSATE SODIUM 8.6-50 MG PO TABS
2.0000 | ORAL_TABLET | Freq: Every day | ORAL | Status: DC
  Administered 2019-02-27: 2 via ORAL
  Filled 2019-02-27: qty 2

## 2019-02-27 MED ORDER — ENOXAPARIN SODIUM 30 MG/0.3ML ~~LOC~~ SOLN
30.0000 mg | SUBCUTANEOUS | Status: DC
  Administered 2019-02-27 – 2019-02-28 (×2): 30 mg via SUBCUTANEOUS
  Filled 2019-02-27 (×2): qty 0.3

## 2019-02-27 MED ORDER — TORSEMIDE 20 MG PO TABS
20.0000 mg | ORAL_TABLET | Freq: Every day | ORAL | Status: DC
  Administered 2019-02-27 – 2019-03-05 (×7): 20 mg via ORAL
  Filled 2019-02-27 (×7): qty 1

## 2019-02-27 NOTE — Progress Notes (Signed)
PROGRESS NOTE  Danny [name] DGU:[id number] DOB: [birth year] DOA: 02/23/2019 PCP: Celene Squibb, MD  Brief History:  63 year old male with a history of hypertension, diabetes mellitus type 2, CKD stage III, psoriasis, venous stasis dermatitis, lymphedema presenting with 2-week history of dyspnea on exertion.  The patient states that he has had worsening lower extremity edema as well as increasing abdominal girth during this period of time.  He also describes some orthopnea type symptoms but denies any chest discomfort, fevers, chills, coughing, hemoptysis, nausea, vomiting, diarrhea, abdominal pain.  He endorses a 20 pound weight gain over the past month.  The patient states that he is supposed to be taking furosemide 40 mg daily, but he only takes 1 tablet (20 mg) daily.  The patient states that he quit smoking in 1988 after approximately 20-pack-year history.  He endorses dietary indiscretions at home. In the emergency department, the patient was afebrile hemodynamically stable with oxygen saturation 95% on room air.  Assessment/Plan: Acute Diastolic CHF/Anasarca/Fluid Overload -in part due to progression of underlying CKD -Continue furosemide IV 40 mg with plans to transition to oral torsemide 02/27/2019 per nephrology. -Echo--EF >65%, no WMA -The patient remains clinically fluid overloaded -Daily weights: Trending down with diuresis  Acute on chronic renal failure--CKD 3 -pt has progression of underlying kidney disease -Previous baseline creatinine of 1.8-1.9 -Monitor with diuresis -Renal ultrasound--medical renal disease, small amount bilateral perinephric stranding -24 hour urine consistent with nephrotic syndrome picture - see nephrology recommendations  Acute respiratory failure with hypoxia -stable on 3L -due to fluid overload in setting of OSA/OHS -wean oxygen as tolerated for saturation >92%  Essential hypertension -Continue metoprolol succinate and amlodipine  -Discontinue lisinopril in the setting of worsening renal function  Diabetes mellitus type 2, uncontrolled with hyperglycemia -Hemoglobin A1c--7.1 -Holding glipizide -Continue reduced dose 70/30 insulin -NovoLog sliding scale  CBG (last 3)  Recent Labs    02/26/19 2136 02/27/19 0757 02/27/19 1107  GLUCAP 129* 139* 221*    Anemia of chronic disease/CKD -FOBT negative -Iron saturation 12%, ferritin 158 -Ferrlecit x1 -Serum Z56, folic acid WNL  Hyperlipidemia -Continue statin -Lipid panel LDL 46, HDL 26, TC 95.   Right ankle wound -Wound care consult appreciated -See pictures in medical record  Chronic Venous stasis dermatitis -Hold off any antibiotics at this time -Monitor clinically -Patient had difficulty tolerating Unna boots in the past  Psoriasis -follows dermatology at [facility] -last dose Siliq 02/20/19  Disposition Plan:  home 02/28/19 Family Communication:  updated sister telephone per pt request  Consultants:  nephrology  Code Status:  FULL  DVT Prophylaxis:  [place] Lovenox   Procedures: As Listed in Progress Note Above  Antibiotics: None  Subjective: Patient reports that he is starting to have much less SOB.   Objective: Vitals:   02/26/19 2130 02/26/19 2134 02/27/19 0544 02/27/19 1313  BP: (!) 152/71  (!) 151/72 (!) 187/85  Pulse: 69 70 66 85  Resp: 20  18 (!) 22  Temp: 99.1 F (37.3 C)  98.6 F (37 C) 98.2 F (36.8 C)  TempSrc: Oral  Oral   SpO2: (!) 86% 94% 95% 90%  Weight:      Height:        Intake/Output Summary (Last 24 hours) at 02/27/2019 1605 Last data filed at 02/27/2019 0500 Gross per 24 hour  Intake 360 ml  Output 1500 ml  Net -1140 ml   Weight change:  Exam:   General:  Pt  is alert, follows commands appropriately, not in acute distress  HEENT: No icterus, No thrush, No neck mass, /AT  Cardiovascular: normal S1/S2, no rubs, no gallops  Respiratory: BBS, no wheeze, no crackles, no rhonchi.   Abdomen:  obese, Soft/+BS, non tender, non distended, no guarding  Extremities: 1+ BLE edema, chronic venous stasis dermatitis.  Data Reviewed: I have personally reviewed following labs and imaging studies Basic Metabolic Panel: Recent Labs  Lab 02/23/19 1440 02/24/19 0434 02/25/19 0452 02/26/19 0418 02/27/19 0458  NA 139 141 141 136 135  K 4.1 4.1 4.4 4.4 4.0  CL 108 110 108 106 103  CO2 22 23 24 24 24   GLUCOSE 70 100* 113* 118* 174*  BUN 43* 46* 50* 57* 57*  CREATININE 2.86* 2.92* 3.38* 3.69* 3.74*  CALCIUM 8.6* 8.7* 8.4* 7.9* 8.2*  PHOS  --   --   --  5.3* 4.8*   Liver Function Tests: Recent Labs  Lab 02/23/19 1714 02/26/19 0418 02/27/19 0458  AST 20  --   --   ALT 28  --   --   ALKPHOS 116  --   --   BILITOT 1.2  --   --   PROT 7.1  --   --   ALBUMIN 2.9* 2.7* 2.8*   No results for input(s): LIPASE, AMYLASE in the last 168 hours. No results for input(s): AMMONIA in the last 168 hours. Coagulation Profile: No results for input(s): INR, PROTIME in the last 168 hours. CBC: Recent Labs  Lab 02/23/19 1440 02/24/19 0434 02/26/19 0418 02/27/19 0458  WBC 9.4 10.4 9.3 12.6*  NEUTROABS 7.3  --   --   --   HGB 8.8* 8.7* 7.6* 8.0*  HCT 28.2* 27.8* 24.7* 25.7*  MCV 84.9 85.5 88.2 85.4  PLT 219 235 193 204   Cardiac Enzymes: No results for input(s): CKTOTAL, CKMB, CKMBINDEX, TROPONINI in the last 168 hours. BNP: Invalid input(s): POCBNP CBG: Recent Labs  Lab 02/26/19 1114 02/26/19 1607 02/26/19 2136 02/27/19 0757 02/27/19 1107  GLUCAP 130* 139* 129* 139* 221*   HbA1C: No results for input(s): HGBA1C in the last 72 hours. Urine analysis:    Component Value Date/Time   COLORURINE YELLOW 02/23/2019 1531   APPEARANCEUR CLEAR 02/23/2019 1531   LABSPEC 1.012 02/23/2019 1531   PHURINE 6.0 02/23/2019 1531   GLUCOSEU 50 (A) 02/23/2019 1531   HGBUR NEGATIVE 02/23/2019 1531   [place] 02/23/2019 1531   KETONESUR NEGATIVE 02/23/2019 1531   PROTEINUR 100  (A) 02/23/2019 1531   NITRITE NEGATIVE 02/23/2019 1531   LEUKOCYTESUR NEGATIVE 02/23/2019 1531    Recent Results (from the past 240 hour(s))  SARS Coronavirus 2 [facility] order, Performed in [facility] hospital lab) Nasopharyngeal Nasopharyngeal Swab     Status: None   Collection Time: 02/23/19  4:01 PM   Specimen: Nasopharyngeal Swab  Result Value Ref Range Status   SARS Coronavirus 2 NEGATIVE NEGATIVE Final    Comment: (NOTE) If result is NEGATIVE SARS-CoV-2 target nucleic acids are NOT DETECTED. The SARS-CoV-2 RNA is generally detectable in upper and lower  respiratory specimens during the acute phase of infection. The lowest  concentration of SARS-CoV-2 viral copies this assay can detect is 250  copies / mL. A negative result does not preclude SARS-CoV-2 infection  and should not be used as the sole basis for treatment or other  patient management decisions.  A negative result may occur with  improper specimen collection / handling, submission of specimen other  than  nasopharyngeal swab, presence of viral mutation(s) within the  areas targeted by this assay, and inadequate number of viral copies  (<250 copies / mL). A negative result must be combined with clinical  observations, patient history, and epidemiological information. If result is POSITIVE SARS-CoV-2 target nucleic acids are DETECTED. The SARS-CoV-2 RNA is generally detectable in upper and lower  respiratory specimens dur ing the acute phase of infection.  Positive  results are indicative of active infection with SARS-CoV-2.  Clinical  correlation with patient history and other diagnostic information is  necessary to determine patient infection status.  Positive results do  not rule out bacterial infection or co-infection with other viruses. If result is PRESUMPTIVE POSTIVE SARS-CoV-2 nucleic acids MAY BE PRESENT.   A presumptive positive result was obtained on the submitted specimen  and confirmed on repeat  testing.  While 2019 novel coronavirus  (SARS-CoV-2) nucleic acids may be present in the submitted sample  additional confirmatory testing may be necessary for epidemiological  and / or clinical management purposes  to differentiate between  SARS-CoV-2 and other Sarbecovirus currently known to infect humans.  If clinically indicated additional testing with an alternate test  methodology [phone]) is advised. The SARS-CoV-2 RNA is generally  detectable in upper and lower respiratory sp ecimens during the acute  phase of infection. The expected result is Negative. Fact Sheet for Patients:  StrictlyIdeas.no Fact Sheet for Healthcare Providers: BankingDealers.co.za This test is not yet approved or cleared by the [place] FDA and has been authorized for detection and/or diagnosis of SARS-CoV-2 by FDA under an Emergency Use Authorization (EUA).  This EUA will remain in effect (meaning this test can be used) for the duration of the COVID-19 declaration under Section 564(b)(1) of the Act, 21 U.S.C. section 360bbb-3(b)(1), unless the authorization is terminated or revoked sooner. Performed at [facility], [address], [place], [place] [address]      Scheduled Meds: . allopurinol  300 mg Oral Daily  . amLODipine  10 mg Oral QHS  . enoxaparin (LOVENOX) injection  30 mg Subcutaneous Q24H  . insulin aspart  0-9 Units Subcutaneous TID WC  . insulin aspart protamine- aspart  20 Units Subcutaneous BID WC  . metoprolol succinate  100 mg Oral QPM  . polyethylene glycol  17 g Oral BID  . senna-docusate  2 tablet Oral QHS  . torsemide  20 mg Oral Daily   Continuous Infusions:  Procedures/Studies: Dg Chest 2 View  Result Date: 02/23/2019 CLINICAL DATA:  Shortness of breath with exertion for several weeks. EXAM: CHEST - 2 VIEW COMPARISON:  None. FINDINGS: Thin accounting for the AP upright projection there is mild to moderate enlargement of the  cardiopericardial silhouette. Blunted posterior costophrenic angles favoring small bilateral pleural effusions with passive atelectasis. No edema is observed. Mild thoracic spondylosis. IMPRESSION: 1. Mild to moderate enlargement of the cardiopericardial silhouette with suspected small bilateral pleural effusions but no current edema. Electronically Signed   By: Van Clines M.D.   On: 02/23/2019 15:00   US Renal  Result Date: 02/24/2019 CLINICAL DATA:  Acute on chronic renal failure EXAM: RENAL / URINARY TRACT ULTRASOUND COMPLETE COMPARISON:  August 30, 2018 FINDINGS: Right Kidney: Renal measurements: 12.2 x 6.3 x 6.5 cm = volume: 262 mL. Again noted is a anechoic mass which is partially exophytic in the midpole of the right kidney measuring 2.2 x 2.4 by 2.4 cm. There is slightly increased cortical echogenicity and thinning. There is perinephric stranding seen around the right  kidney. Left Kidney: Renal measurements: 14.0 x 6.3 x 5.8 cm = volume: 269 mL. There is slightly increased cortical echogenicity and thinning. There is a small amount of perinephric stranding/fluid seen. Bladder: Bladder is partially distended. IMPRESSION: Findings suggestive of chronic medical renal disease. Partially exophytic mid right kidney simple cyst. Small amount of bilateral perinephric stranding and left perinephric fluid. Electronically Signed   By: Prudencio Pair M.D.   On: 02/24/2019 09:36   Ct Maxillofacial Wo Contrast  Result Date: 02/04/2019 CLINICAL DATA:  63 year old male with chronic sinusitis. EXAM: CT MAXILLOFACIAL WITHOUT CONTRAST TECHNIQUE: Multidetector CT images of the paranasal sinuses were obtained using the standard protocol without intravenous contrast. COMPARISON:  None. FINDINGS: Paranasal sinuses: Frontal: Mildly hyperplastic. Trace mucosal thickening, otherwise normally aerated. Patent frontal sinus drainage pathways. Ethmoid: Trace anterior ethmoid mucosal thickening, otherwise normally aerated.  Maxillary: Normally aerated. Sphenoid: Mildly hyperplastic. Trace mucosal thickening on the right, otherwise normally aerated. Patent sphenoethmoidal recesses. Right ostiomeatal unit: Patent (coronal image 22). Left ostiomeatal unit: Patent (same image). Nasal passages: Mostly rightward nasal septal deviation. Intact septum. Symmetric nasal cavity mucosal thickening (coronal image 28). No retained secretions. Pneumatized olfactory recesses. Anatomy: Mildly hyperplastic sphenoid sinuses with marginal left anterior clinoid process pneumatization. Anterior ethmoidal artery position suspected on coronal images 26 and 27 with some pneumatization superior to both notches. Keros type 2 olfactory fossa. Other: Calcified atherosclerosis at the skull base. Negative for age visible noncontrast brain parenchyma. Negative orbits soft tissues. Visualized scalp soft tissues are within normal limits. Negative visible noncontrast deep soft tissue spaces of the face. Tympanic cavities and mastoids are clear. Maxillary dentition might be absent. No acute osseous abnormality identified. IMPRESSION: 1. Trace mucosal thickening but otherwise normally aerated paranasal sinuses and patent sinus drainage pathways. 2. Mildly hyperplastic frontal and sphenoid sinuses. 3. Symmetric nasal cavity mucosal thickening raising the possibility of rhinitis. Mild rightward septal deviation. Electronically Signed   By: Genevie Ann M.D.   On: 02/04/2019 00:37    Artelia Game Wynetta Emery, MD  [facility] How to contact the [facility] Attending or Consulting provider [place] or covering provider during after hours [place], for this patient?  1. Check the care team in [facility] and look for a) attending/consulting [facility] provider listed and b) the [facility] team listed 2. Log into [email or web address] and use [place]'s universal password to access. If you do not have the password, please contact the hospital operator. 3. Locate the [facility] provider you are looking for under [facility] and page to a number that you can be directly reached. 4. If you still have difficulty reaching the provider, please page the [facility] (Director on Call) for the Hospitalists listed on amion for assistance.   If 7PM-7AM, please contact night-coverage [email or web address] Password [email or web address] 02/27/2019, 4:05 PM   LOS: 4 days

## 2019-02-27 NOTE — Progress Notes (Addendum)
Patient ID: Danny [name], male   DOB: [birth year], 63 y.o.   MRN: [id number] S: "I'm constipated and miserable" O:BP (!) 151/72 (BP Location: Left Arm)   Pulse 66   Temp 98.6 F (37 C) (Oral)   Resp 18   Ht 6' (1.829 m)   Wt (!) 143.2 kg   SpO2 95%   BMI 42.82 kg/m   Intake/Output Summary (Last 24 hours) at 02/27/2019 0849 Last data filed at 02/27/2019 0500 Gross per 24 hour  Intake 840 ml  Output 2250 ml  Net -1410 ml   Intake/Output: I/O last 3 completed shifts: In: 840 [P.O.:840] Out: 3200 [Urine:3200]  Intake/Output this shift:  No intake/output data recorded. Weight change:  Gen: WD obese WM in mild distress CVS: no rub Resp: crackles at left base Abd: obese, +BS, soft, NT Ext:1+ edema with chronic venous stasis changes, sore on right lateral maleolus with cushion dressing in place  Recent Labs  Lab 02/23/19 1440 02/23/19 1714 02/24/19 0434 02/25/19 0452 02/26/19 0418 02/27/19 0458  NA 139  --  141 141 136 135  K 4.1  --  4.1 4.4 4.4 4.0  CL 108  --  110 108 106 103  CO2 22  --  23 24 24 24   GLUCOSE 70  --  100* 113* 118* 174*  BUN 43*  --  46* 50* 57* 57*  CREATININE 2.86*  --  2.92* 3.38* 3.69* 3.74*  ALBUMIN  --  2.9*  --   --  2.7* 2.8*  CALCIUM 8.6*  --  8.7* 8.4* 7.9* 8.2*  PHOS  --   --   --   --  5.3* 4.8*  AST  --  20  --   --   --   --   ALT  --  28  --   --   --   --    Liver Function Tests: Recent Labs  Lab 02/23/19 1714 02/26/19 0418 02/27/19 0458  AST 20  --   --   ALT 28  --   --   ALKPHOS 116  --   --   BILITOT 1.2  --   --   PROT 7.1  --   --   ALBUMIN 2.9* 2.7* 2.8*   No results for input(s): LIPASE, AMYLASE in the last 168 hours. No results for input(s): AMMONIA in the last 168 hours. CBC: Recent Labs  Lab 02/23/19 1440 02/24/19 0434 02/26/19 0418 02/27/19 0458  WBC 9.4 10.4 9.3 12.6*  NEUTROABS 7.3  --   --   --   HGB 8.8* 8.7* 7.6* 8.0*  HCT 28.2* 27.8* 24.7* 25.7*  MCV 84.9 85.5 88.2 85.4  PLT 219 235 193 204    Cardiac Enzymes: No results for input(s): CKTOTAL, CKMB, CKMBINDEX, TROPONINI in the last 168 hours. CBG: Recent Labs  Lab 02/26/19 0748 02/26/19 1114 02/26/19 1607 02/26/19 2136 02/27/19 0757  GLUCAP 111* 130* 139* 129* 139*    Iron Studies: No results for input(s): IRON, TIBC, TRANSFERRIN, FERRITIN in the last 72 hours. Studies/Results: No results found. [place] Kitchen allopurinol  300 mg Oral Daily  . amLODipine  10 mg Oral QHS  . enoxaparin (LOVENOX) injection  70 mg Subcutaneous Q24H  . insulin aspart  0-9 Units Subcutaneous TID WC  . insulin aspart protamine- aspart  20 Units Subcutaneous BID WC  . metoprolol succinate  100 mg Oral QPM  . torsemide  20 mg Oral Daily    BMET    Component Value  Date/Time   NA 135 02/27/2019 0458   K 4.0 02/27/2019 0458   CL 103 02/27/2019 0458   CO2 24 02/27/2019 0458   GLUCOSE 174 (H) 02/27/2019 0458   BUN 57 (H) 02/27/2019 0458   CREATININE 3.74 (H) 02/27/2019 0458   CALCIUM 8.2 (L) 02/27/2019 0458   GFRNONAA 16 (L) 02/27/2019 0458   GFRAA 18 (L) 02/27/2019 0458   CBC    Component Value Date/Time   WBC 12.6 (H) 02/27/2019 0458   RBC 3.01 (L) 02/27/2019 0458   HGB 8.0 (L) 02/27/2019 0458   HCT 25.7 (L) 02/27/2019 0458   PLT 204 02/27/2019 0458   MCV 85.4 02/27/2019 0458   MCH 26.6 02/27/2019 0458   MCHC 31.1 02/27/2019 0458   RDW 17.7 (H) 02/27/2019 0458   LYMPHSABS 1.2 02/23/2019 1440   MONOABS 0.5 02/23/2019 1440   EOSABS 0.3 02/23/2019 1440   BASOSABS 0.0 02/23/2019 1440     Assessment/Plan: 1. AKI/CKD stage 4- in setting of decompensated CHF, NSAID use with concomitant ACE inhibition. His lisinopril was discontinued and he has diuresed well. REnal US consistent with chronic medical renal disease. We discussed the ways to delay the progression of ckd with  1. Tight BP control (goal <130/80) 2. Tight glucose control (goal Hgb A1c <7%) 3. Use of an ACE/ARB (currently on hold due to AKI) 4. Avoidance of NSAIDS/Cox-II  I"s 5. No indication for dialysis, however he will need close follow up and education as he has had progressive CKD over the past 3 years. 6. Continue to hold lisinopril 7. SPEP negative, UPEP pending. 8. BUN/Cr appear to have reached a plateau.  Will change to po torsemide and follow UOP and Scr and hopefully will see some improvement. 9. Will need close follow up after discharge 2. Acute diastolic CHF/anasarca- diuresing well with lasix 40 mg IV bid, consider Cardiology evaluation given new onset CHF.  Not sure if this is related to right sided CHF, progressive CKD, or nephrotic syndrome.  Had ordered 24 hour protein collection, however this was cancelled.  Will reorder and follow.   1. Diuresed over 7 liters since admission 2. 24 hour urine protein with 3 grams and low albumin consistent with nephrotic syndrome likely due to Diabetic nephropathy but has never had renal biopsy.  Cont to follow.  3. Will change to po torsemide tomorrow 20 mg daily and follow response. 3. Anemia of CKD- as well as low iron stores. Given dose of IV feraheme and follow, however he may need ESA as an outpatient.  Hgb up to 8 from 7.6. 1. Continue to follow H/H while he remains an inpatient 2. Will need outpatient ESA after discharge.  3. Will dose with Aranesp today. 4. HTN- stable off of lisinopril.  Could add hydralazine 25mg  tid if BP's remain above goal 5. Constipation- has miralax ordered and pt has asked nurse for it. 6. DM - per primary svc 7. Obesity- stressed importance of diet/weight loss 8. HLD- on statin 9. Chronic stasis dermatitis- cont with diuretics and agree with UNNA boots if tolerates 10. Disposition- need to see stabilization/improvement of renal function prior to discharge.  [name], MD [employer] [phone]

## 2019-02-27 NOTE — Plan of Care (Signed)

## 2019-02-27 NOTE — Progress Notes (Signed)
PT Cancellation Note  Patient Details Name: Danny [name] MRN: [id number] DOB: 05/03/1956   Cancelled Treatment:    Reason Eval/Treat Not Completed: Pain limiting ability to participate.  Patient declined therapy secondary to discomfort from constipation - RN notified.    3:54 PM, 02/27/19 Lonell Grandchild, MPT Physical Therapist with [facility] [phone] office [phone] mobile phone

## 2019-02-28 ENCOUNTER — Inpatient Hospital Stay (HOSPITAL_COMMUNITY): Payer: Medicare HMO

## 2019-02-28 LAB — URINALYSIS, ROUTINE W REFLEX MICROSCOPIC
Bilirubin Urine: NEGATIVE
Glucose, UA: NEGATIVE mg/dL
Ketones, ur: NEGATIVE mg/dL
Nitrite: NEGATIVE
Protein, ur: 100 mg/dL — AB
RBC / HPF: >50 RBC/hpf — ABNORMAL HIGH (ref 0–5)
Specific Gravity, Urine: 1.01 (ref 1.005–1.030)
WBC, UA: >50 WBC/hpf — ABNORMAL HIGH (ref 0–5)
pH: 7 (ref 5.0–8.0)

## 2019-02-28 LAB — RENAL FUNCTION PANEL
Albumin: 2.9 g/dL — ABNORMAL LOW (ref 3.5–5.0)
Anion gap: 9 (ref 5–15)
BUN: 54 mg/dL — ABNORMAL HIGH (ref 8–23)
CO2: 24 mmol/L (ref 22–32)
Calcium: 8.4 mg/dL — ABNORMAL LOW (ref 8.9–10.3)
Chloride: 101 mmol/L (ref 98–111)
Creatinine, Ser: 3.43 mg/dL — ABNORMAL HIGH (ref 0.61–1.24)
GFR calc Af Amer: 21 mL/min — ABNORMAL LOW (ref >60)
GFR calc non Af Amer: 18 mL/min — ABNORMAL LOW (ref >60)
Glucose, Bld: 181 mg/dL — ABNORMAL HIGH (ref 70–99)
Phosphorus: 4.3 mg/dL (ref 2.5–4.6)
Potassium: 4.2 mmol/L (ref 3.5–5.1)
Sodium: 134 mmol/L — ABNORMAL LOW (ref 135–145)

## 2019-02-28 LAB — GLUCOSE, CAPILLARY
Glucose-Capillary: 164 mg/dL — ABNORMAL HIGH (ref 70–99)
Glucose-Capillary: 173 mg/dL — ABNORMAL HIGH (ref 70–99)
Glucose-Capillary: 206 mg/dL — ABNORMAL HIGH (ref 70–99)
Glucose-Capillary: 145 mg/dL — ABNORMAL HIGH (ref 70–99)

## 2019-02-28 MED ORDER — POLYETHYLENE GLYCOL 3350 17 G PO PACK: 17.0000 g | PACK | Freq: Every day | ORAL | Status: DC | PRN

## 2019-02-28 MED ORDER — SENNOSIDES-DOCUSATE SODIUM 8.6-50 MG PO TABS
2.0000 | ORAL_TABLET | Freq: Every evening | ORAL | Status: DC | PRN
  Administered 2019-03-03: 2 via ORAL
  Filled 2019-02-28: qty 2

## 2019-02-28 MED ORDER — SODIUM CHLORIDE 0.9 % IV SOLN
INTRAVENOUS | Status: DC | PRN
  Administered 2019-02-28: 500 mL via INTRAVENOUS

## 2019-02-28 MED ORDER — LEVOFLOXACIN IN D5W 500 MG/100ML IV SOLN
500.0000 mg | INTRAVENOUS | Status: DC
  Administered 2019-02-28: 500 mg via INTRAVENOUS
  Filled 2019-02-28: qty 100

## 2019-02-28 NOTE — Progress Notes (Signed)
Has had three loose incontinent bms today.  Back now from CT of abd to assess abd pain.

## 2019-02-28 NOTE — Progress Notes (Signed)
Foley inserted and 1775 of cloudy, odorous, tea colored urine returned.  Sample sent to lab

## 2019-02-28 NOTE — Progress Notes (Signed)
Pharmacy Antibiotic Note  Danny [name] is a 63 y.o. male admitted on 02/23/2019 with UTI and cystitis.  Pharmacy has been consulted for Levaquin dosing.  Plan: Levaquin 500 mg IV every 48 hours.  Monitor labs, c/s, and dose adjustment with improved renal function.  Height: 6' (182.9 cm) Weight: (!) 321 lb 6.9 oz (145.8 kg) IBW/kg (Calculated) : 77.6  Temp (24hrs), Avg:98 F (36.7 C), Min:97.9 F (36.6 C), Max:98.1 F (36.7 C)  Recent Labs  Lab 02/23/19 1440 02/24/19 0434 02/25/19 0452 02/26/19 0418 02/27/19 0458 02/28/19 0420  WBC 9.4 10.4  --  9.3 12.6*  --   CREATININE 2.86* 2.92* 3.38* 3.69* 3.74* 3.43*    Estimated Creatinine Clearance: 31.9 mL/min (A) (by C-G formula based on SCr of 3.43 mg/dL (H)).    Allergies  Allergen Reactions  . Dust Mite Extract Itching and Other (See Comments)    Unknown reaction-potential shortness of breath  . Prednisone Nausea And Vomiting  . Rocephin [Ceftriaxone] Nausea And Vomiting    Antimicrobials this admission: Levaquin 9/4 >>      Dose adjustments this admission: Levaquin  Microbiology results: 9/4 UCx: pending    Thank you for allowing pharmacy to be a part of this patient's care.  Ramond Craver 02/28/2019 3:28 PM

## 2019-02-28 NOTE — Care Management Important Message (Signed)
Important Message  Patient Details  Name: Danny [name] MRN: [id number] Date of Birth: 01/05/1956   Medicare Important Message Given:  Yes     [name] 02/28/2019, 12:21 PM

## 2019-02-28 NOTE — Plan of Care (Signed)
  Problem: Acute Rehab PT Goals(only PT should resolve) Goal: Pt Will Go Supine/Side To Sit Outcome: Progressing Flowsheets (Taken 02/28/2019 1230) Pt will go Supine/Side to Sit: with supervision Goal: Patient Will Transfer Sit To/From Stand Outcome: Progressing Flowsheets (Taken 02/28/2019 1230) Patient will transfer sit to/from stand: with min guard assist Goal: Pt Will Transfer Bed To Chair/Chair To Bed Outcome: Progressing Flowsheets (Taken 02/28/2019 1230) Pt will Transfer Bed to Chair/Chair to Bed: min guard assist Goal: Pt Will Ambulate Outcome: Progressing Flowsheets (Taken 02/28/2019 1230) Pt will Ambulate:  25 feet  with minimal assist  with rolling walker   12:31 PM, 02/28/19 Lonell Grandchild, MPT Physical Therapist with [facility] [phone] office [phone] mobile phone

## 2019-02-28 NOTE — [place] FL2 (Signed)
[place] MEDICAID FL2 LEVEL OF CARE SCREENING TOOL     IDENTIFICATION  Patient Name: Danny [name] Birthdate: [birth year] Sex: male Admission Date (Current Location): 02/23/2019  [facility] and [place] Number:  [employer] and Address:  [place] [address] [place]      Provider Number: [id number]  Attending Physician Name and Address:  Murlean Iba, MD  Relative Name and Phone Number:       Current Level of Care: Hospital Recommended Level of Care: [place] Prior Approval Number:    Date Approved/Denied:   PASRR Number: [id number]  Discharge Plan: SNF    Current Diagnoses: Patient Active Problem List   Diagnosis Date Noted  . Acute diastolic CHF (congestive heart failure) ([place]) 02/25/2019  . Acute respiratory failure with hypoxia ([place]) 02/25/2019  . Acute renal failure superimposed on stage 3 chronic kidney disease ([place]) 02/24/2019  . Acute CHF (congestive heart failure) ([place]) 02/24/2019  . Dyspnea 02/23/2019  . Essential hypertension 02/23/2019  . Diabetes mellitus ([place]) 02/23/2019  . CKD (chronic kidney disease) 02/23/2019  . Psoriasis 02/23/2019    Orientation RESPIRATION BLADDER Height & Weight     Self, Time, Situation, Place  O2(2L Nasal Cannula) Incontinent Weight: (!) 145.8 kg Height:  6' (182.9 cm)  BEHAVIORAL SYMPTOMS/MOOD NEUROLOGICAL BOWEL NUTRITION STATUS  (none) (none) Incontinent Diet(HH, Carb Mod)  AMBULATORY STATUS COMMUNICATION OF NEEDS Skin   Extensive Assist Verbally Other (Comment)(Open wound, R ankle posterior, diabetic ulcer foot plantar aspect)                       Personal Care Assistance Level of Assistance  Bathing, Feeding, Dressing Bathing Assistance: Maximum assistance Feeding assistance: Independent Dressing Assistance: Limited assistance     Functional Limitations Info  Sight, Hearing, Speech Sight Info: Adequate Hearing Info: Adequate Speech Info:  Adequate    SPECIAL CARE FACTORS FREQUENCY  PT (By licensed PT)     PT Frequency: 5X/W              Contractures Contractures Info: Not present    Additional Factors Info  Code Status, Allergies Code Status Info: Full Allergies Info: Dust mite extract, Prednisone, Rocephin           Current Medications (02/28/2019):  This is the current hospital active medication list Current Facility-Administered Medications  Medication Dose Route Frequency Provider Last Rate Last Dose  . acetaminophen (TYLENOL) tablet 650 mg  650 mg Oral Q6H PRN Emokpae, Ejiroghene E, MD   650 mg at 02/28/19 0045   Or  . acetaminophen (TYLENOL) suppository 650 mg  650 mg Rectal Q6H PRN Emokpae, Ejiroghene E, MD      . allopurinol (ZYLOPRIM) tablet 300 mg  300 mg Oral Daily Emokpae, Ejiroghene E, MD   300 mg at 02/28/19 0826  . alum & mag hydroxide-simeth (MAALOX/MYLANTA) 200-200-20 MG/5ML suspension 30 mL  30 mL Oral Q4H PRN Johnson, Clanford L, MD   30 mL at 02/27/19 1800  . amLODipine (NORVASC) tablet 10 mg  10 mg Oral QHS Emokpae, Ejiroghene E, MD   10 mg at 02/27/19 2201  . enoxaparin (LOVENOX) injection 30 mg  30 mg Subcutaneous Q24H Johnson, Clanford L, MD   30 mg at 02/27/19 2201  . insulin aspart (novoLOG) injection 0-9 Units  0-9 Units Subcutaneous TID WC Emokpae, Ejiroghene E, MD   2 Units at 02/28/19 1222  . insulin aspart protamine- aspart (NOVOLOG MIX 70/30) injection 20  Units  20 Units Subcutaneous BID WC Emokpae, Ejiroghene E, MD   20 Units at 02/28/19 0827  . metoprolol succinate (TOPROL-XL) 24 hr tablet 100 mg  100 mg Oral QPM Emokpae, Ejiroghene E, MD   100 mg at 02/27/19 1745  . ondansetron (ZOFRAN) tablet 4 mg  4 mg Oral Q6H PRN Emokpae, Ejiroghene E, MD   4 mg at 02/24/19 1433   Or  . ondansetron (ZOFRAN) injection 4 mg  4 mg Intravenous Q6H PRN Emokpae, Ejiroghene E, MD      . polyethylene glycol (MIRALAX / GLYCOLAX) packet 17 g  17 g Oral BID Johnson, Clanford L, MD   17 g at 02/28/19  0826  . senna-docusate (Senokot-S) tablet 2 tablet  2 tablet Oral QHS Wynetta Emery, Clanford L, MD   2 tablet at 02/27/19 2201  . torsemide (DEMADEX) tablet 20 mg  20 mg Oral Daily Donato Heinz, MD   20 mg at 02/28/19 2706     Discharge Medications: Please see discharge summary for a list of discharge medications.  Relevant Imaging Results:  Relevant Lab Results:   Additional Information [id number]  [place], [place]

## 2019-02-28 NOTE — TOC Initial Note (Addendum)
Transition of Care [facility]) - Initial/Assessment Note    Patient Details  Name: Danny [name] MRN: [id number] Date of Birth: 1956-04-14  [facility] CM/SW Contact:    Trish Mage, LCSW Phone Number: 02/28/2019, 3:24 PM  Clinical Narrative:   Diabetes, CKD, CHF, Acute Respiratory Failure with Hypoxia  States he is weak and is open to going to rehab.  Lives alone.  Gillermo Murdoch is at bedside, sister is in [place] and is the other support.  At home he has wheelchair, cane walker, BSC and shower chair.  [facility] is first choice, [facility] is second.  Info sent out to both      [facility] offered bed.  Starting authorizationrequest today.            Expected Discharge Plan: Skilled Nursing Facility Barriers to Discharge: No Barriers Identified   Patient Goals and CMS Choice Patient states their goals for this hospitalization and ongoing recovery are:: "I know I'm in bad shape, and I know i need help." CMS Medicare.gov Compare Post Acute Care list provided to:: Patient Choice offered to / list presented to : Patient  Expected Discharge Plan and Services Expected Discharge Plan: [place]   Discharge Planning Services: CM Consult Post Acute Care Choice: [place] Living arrangements for the past 2 months: Apartment Expected Discharge Date: 02/28/19                                    Prior Living Arrangements/Services Living arrangements for the past 2 months: Apartment Lives with:: Self Patient language and need for interpreter reviewed:: Yes Do you feel safe going back to the place where you live?: Yes      Need for Family Participation in Patient Care: Yes (Comment) Care giver support system in place?: Yes (comment) Current home services: DME Criminal Activity/Legal Involvement Pertinent to Current Situation/Hospitalization: No - Comment as needed  Activities of Daily Living Home Assistive Devices/Equipment: Walker (specify type)(front wheel  and four wheel) ADL Screening (condition at time of admission) Patient's cognitive ability adequate to safely complete daily activities?: Yes Is the patient deaf or have difficulty hearing?: No Does the patient have difficulty seeing, even when wearing glasses/contacts?: No Does the patient have difficulty concentrating, remembering, or making decisions?: No Patient able to express need for assistance with ADLs?: Yes Does the patient have difficulty dressing or bathing?: No Independently performs ADLs?: Yes (appropriate for developmental age) Does the patient have difficulty walking or climbing stairs?: Yes Weakness of Legs: Both Weakness of Arms/Hands: Both  Permission Sought/Granted Permission sought to share information with : Family Supports Permission granted to share information with : Yes, Verbal Permission Granted  Share Information with NAME: Lattie Haw, friend   [name], sister        Permission granted to share info w Contact Information: [phone]  Emotional Assessment Appearance:: Appears stated age Attitude/Demeanor/Rapport: Engaged Affect (typically observed): Appropriate Orientation: : Oriented to Self, Oriented to Place, Oriented to  Time, Oriented to Situation Alcohol / Substance Use: Not Applicable Psych Involvement: No (comment)  Admission diagnosis:  AKI (acute kidney injury) ([place]) [N17.9] New onset of congestive heart failure ([place]) [I50.9] Dyspnea [R06.00] Patient Active Problem List   Diagnosis Date Noted  . Acute diastolic CHF (congestive heart failure) ([place]) 02/25/2019  . Acute respiratory failure with hypoxia ([place]) 02/25/2019  . Acute renal failure superimposed on stage 3 chronic kidney disease ([place]) 02/24/2019  .  Acute CHF (congestive heart failure) ([place]) 02/24/2019  . Dyspnea 02/23/2019  . Essential hypertension 02/23/2019  . Diabetes mellitus ([place]) 02/23/2019  . CKD (chronic kidney disease) 02/23/2019  . Psoriasis 02/23/2019   PCP:  Celene Squibb,  MD Pharmacy:   [facility] [address] [place] [address] [place] [address] [place] [address] Phone: [phone] Fax: [phone]     Social Determinants of Health (SDOH) Interventions    Readmission Risk Interventions No flowsheet data found.

## 2019-02-28 NOTE — Progress Notes (Addendum)
PROGRESS NOTE  Danny [name] YQM:[id number] DOB: [birth year] DOA: 02/23/2019 PCP: Celene Squibb, MD  Brief History:  63 year old male with a history of hypertension, diabetes mellitus type 2, CKD stage III, psoriasis, venous stasis dermatitis, lymphedema presenting with 2-week history of dyspnea on exertion.  The patient states that he has had worsening lower extremity edema as well as increasing abdominal girth during this period of time.  He also describes some orthopnea type symptoms but denies any chest discomfort, fevers, chills, coughing, hemoptysis, nausea, vomiting, diarrhea, abdominal pain.  He endorses a 20 pound weight gain over the past month.  The patient states that he is supposed to be taking furosemide 40 mg daily, but he only takes 1 tablet (20 mg) daily.  The patient states that he quit smoking in 1988 after approximately 20-pack-year history.  He endorses dietary indiscretions at home. In the emergency department, the patient was afebrile hemodynamically stable with oxygen saturation 95% on room air.  Assessment/Plan: Acute Diastolic CHF/Anasarca/Fluid Overload -in part due to progression of underlying CKD -Continue furosemide IV 40 mg with plans to transition to oral torsemide 02/27/2019 per nephrology. -Echo--EF >65%, no WMA -The patient remains clinically fluid overloaded -Daily weights: Trending down with diuresis  Acute on chronic renal failure--CKD 3 -pt has progression of underlying kidney disease -Previous baseline creatinine of 1.8-1.9 -Monitor with diuresis -Renal ultrasound--medical renal disease, small amount bilateral perinephric stranding -24 hour urine consistent with nephrotic syndrome picture - see nephrology recommendations  Acute respiratory failure with hypoxia -stable on 3L -due to fluid overload in setting of OSA/OHS -wean oxygen as tolerated for saturation >92%  Abdominal Pain - Pt first complained of abdominal pain to me today 9/4 and  having LLQ pain. Sent for CT abdomen STAT: findings concerning for bladder outlet obstruction and mild bilateral ureterohydronephrosis.  Ordered bladder scan and foley placement.  Pt having regular bowel movements now.  Pain radiating into back likely from bladder, check urinalysis for cystitis.  Renal US in AM after decompression from foley placement.  Likely will need to keep foley until urology can see him in the office.   Essential hypertension -Continue metoprolol succinate and amlodipine -Discontinue lisinopril in the setting of worsening renal function  Diabetes mellitus type 2, uncontrolled with hyperglycemia -Hemoglobin A1c--7.1 -Holding glipizide -Continue reduced dose 70/30 insulin -NovoLog sliding scale  CBG (last 3)  Recent Labs    02/27/19 2146 02/28/19 0717 02/28/19 1206  GLUCAP 160* 164* 173*    Anemia of chronic disease/CKD -FOBT negative -Iron saturation 12%, ferritin 158 -Ferrlecit x1 -Serum G89, folic acid WNL  Chronic constipation - Pt had large BM yesterday and 3 BMs today.  Hold laxatives now.    Bladder Outlet Obstruction Foley placed and 1775 cc cloudy, foul smelling urine returned.  Urine culture ordered and IV levaquin ordered.    Hyperlipidemia -Continue statin -Lipid panel LDL 46, HDL 26, TC 95.   Right ankle wound -Wound care consult appreciated -See pictures in medical record  Chronic Venous stasis dermatitis -Hold off any antibiotics at this time -Monitor clinically -Patient had difficulty tolerating Unna boots in the past  Psoriasis -follows dermatology at [facility] -last dose Siliq 02/20/19  Disposition Plan:  home 02/28/19 Family Communication:  updated sister telephone per pt request  Consultants:  nephrology  Code Status:  FULL  DVT Prophylaxis:  [place] Lovenox   Procedures: As Listed in Progress Note Above  Antibiotics: None  Subjective: Patient reports LLQ  abdominal pain radiates into back.    Objective: Vitals:   02/27/19 1833 02/27/19 1931 02/27/19 2145 02/28/19 0507  BP: (!) 176/82  (!) 159/60 (!) 153/68  Pulse: 73  72 69  Resp:   17 20  Temp:   98.1 F (36.7 C) 97.9 F (36.6 C)  TempSrc:   Oral Oral  SpO2: 100% 95% 96% 99%  Weight:    (!) 145.8 kg  Height:        Intake/Output Summary (Last 24 hours) at 02/28/2019 1435 Last data filed at 02/28/2019 1300 Gross per 24 hour  Intake 360 ml  Output 2350 ml  Net -1990 ml   Weight change:  Exam:   General:  Pt is alert, follows commands appropriately, not in acute distress  HEENT: No icterus, No thrush, No neck mass, [place]/AT  Cardiovascular: normal S1/S2, no rubs, no gallops  Respiratory: BBS, no wheeze, no crackles, no rhonchi.   Abdomen: obese, Soft/+BS, LLQ and suprapubic tenderness, mildly distended, no guarding  Extremities: 1+ BLE edema, chronic venous stasis dermatitis.  Data Reviewed: I have personally reviewed following labs and imaging studies Basic Metabolic Panel: Recent Labs  Lab 02/24/19 0434 02/25/19 0452 02/26/19 0418 02/27/19 0458 02/28/19 0420  NA 141 141 136 135 134*  K 4.1 4.4 4.4 4.0 4.2  CL 110 108 106 103 101  CO2 23 24 24 24 24   GLUCOSE 100* 113* 118* 174* 181*  BUN 46* 50* 57* 57* 54*  CREATININE 2.92* 3.38* 3.69* 3.74* 3.43*  CALCIUM 8.7* 8.4* 7.9* 8.2* 8.4*  PHOS  --   --  5.3* 4.8* 4.3   Liver Function Tests: Recent Labs  Lab 02/23/19 1714 02/26/19 0418 02/27/19 0458 02/28/19 0420  AST 20  --   --   --   ALT 28  --   --   --   ALKPHOS 116  --   --   --   BILITOT 1.2  --   --   --   PROT 7.1  --   --   --   ALBUMIN 2.9* 2.7* 2.8* 2.9*   No results for input(s): LIPASE, AMYLASE in the last 168 hours. No results for input(s): AMMONIA in the last 168 hours. Coagulation Profile: No results for input(s): INR, PROTIME in the last 168 hours. CBC: Recent Labs  Lab 02/23/19 1440 02/24/19 0434 02/26/19 0418 02/27/19 0458  WBC 9.4 10.4 9.3 12.6*  NEUTROABS 7.3   --   --   --   HGB 8.8* 8.7* 7.6* 8.0*  HCT 28.2* 27.8* 24.7* 25.7*  MCV 84.9 85.5 88.2 85.4  PLT 219 235 193 204   Cardiac Enzymes: No results for input(s): CKTOTAL, CKMB, CKMBINDEX, TROPONINI in the last 168 hours. BNP: Invalid input(s): POCBNP CBG: Recent Labs  Lab 02/27/19 1107 02/27/19 1615 02/27/19 2146 02/28/19 0717 02/28/19 1206  GLUCAP 221* 221* 160* 164* 173*   HbA1C: No results for input(s): HGBA1C in the last 72 hours. Urine analysis:    Component Value Date/Time   COLORURINE YELLOW 02/23/2019 1531   APPEARANCEUR CLEAR 02/23/2019 1531   LABSPEC 1.012 02/23/2019 1531   PHURINE 6.0 02/23/2019 1531   GLUCOSEU 50 (A) 02/23/2019 1531   HGBUR NEGATIVE 02/23/2019 1531   [place] 02/23/2019 1531   KETONESUR NEGATIVE 02/23/2019 1531   PROTEINUR 100 (A) 02/23/2019 1531   NITRITE NEGATIVE 02/23/2019 1531   LEUKOCYTESUR NEGATIVE 02/23/2019 1531    Recent Results (from the past 240 hour(s))  SARS Coronavirus 2 [facility] order,  Performed in [facility] hospital lab) Nasopharyngeal Nasopharyngeal Swab     Status: None   Collection Time: 02/23/19  4:01 PM   Specimen: Nasopharyngeal Swab  Result Value Ref Range Status   SARS Coronavirus 2 NEGATIVE NEGATIVE Final    Comment: (NOTE) If result is NEGATIVE SARS-CoV-2 target nucleic acids are NOT DETECTED. The SARS-CoV-2 RNA is generally detectable in upper and lower  respiratory specimens during the acute phase of infection. The lowest  concentration of SARS-CoV-2 viral copies this assay can detect is 250  copies / mL. A negative result does not preclude SARS-CoV-2 infection  and should not be used as the sole basis for treatment or other  patient management decisions.  A negative result may occur with  improper specimen collection / handling, submission of specimen other  than nasopharyngeal swab, presence of viral mutation(s) within the  areas targeted by this assay, and inadequate number of viral copies   (<250 copies / mL). A negative result must be combined with clinical  observations, patient history, and epidemiological information. If result is POSITIVE SARS-CoV-2 target nucleic acids are DETECTED. The SARS-CoV-2 RNA is generally detectable in upper and lower  respiratory specimens dur ing the acute phase of infection.  Positive  results are indicative of active infection with SARS-CoV-2.  Clinical  correlation with patient history and other diagnostic information is  necessary to determine patient infection status.  Positive results do  not rule out bacterial infection or co-infection with other viruses. If result is PRESUMPTIVE POSTIVE SARS-CoV-2 nucleic acids MAY BE PRESENT.   A presumptive positive result was obtained on the submitted specimen  and confirmed on repeat testing.  While 2019 novel coronavirus  (SARS-CoV-2) nucleic acids may be present in the submitted sample  additional confirmatory testing may be necessary for epidemiological  and / or clinical management purposes  to differentiate between  SARS-CoV-2 and other Sarbecovirus currently known to infect humans.  If clinically indicated additional testing with an alternate test  methodology [phone]) is advised. The SARS-CoV-2 RNA is generally  detectable in upper and lower respiratory sp ecimens during the acute  phase of infection. The expected result is Negative. Fact Sheet for Patients:  StrictlyIdeas.no Fact Sheet for Healthcare Providers: BankingDealers.co.za This test is not yet approved or cleared by the [place] FDA and has been authorized for detection and/or diagnosis of SARS-CoV-2 by FDA under an Emergency Use Authorization (EUA).  This EUA will remain in effect (meaning this test can be used) for the duration of the COVID-19 declaration under Section 564(b)(1) of the Act, 21 U.S.C. section 360bbb-3(b)(1), unless the authorization is terminated  or revoked sooner. Performed at [facility], [address], [place], [place] [address]      Scheduled Meds:  allopurinol  300 mg Oral Daily   amLODipine  10 mg Oral QHS   enoxaparin (LOVENOX) injection  30 mg Subcutaneous Q24H   insulin aspart  0-9 Units Subcutaneous TID WC   insulin aspart protamine- aspart  20 Units Subcutaneous BID WC   metoprolol succinate  100 mg Oral QPM   torsemide  20 mg Oral Daily   Continuous Infusions:  Procedures/Studies: Ct Abdomen Pelvis Wo Contrast  Result Date: 02/28/2019 CLINICAL DATA:  History of diverticulitis.  Abdominal distension. EXAM: CT ABDOMEN AND PELVIS WITHOUT CONTRAST TECHNIQUE: Multidetector CT imaging of the abdomen and pelvis was performed following the standard protocol without IV contrast. COMPARISON:  None. FINDINGS: Lower chest: Small bilateral pleural effusions are noted with minimal adjacent  subsegmental atelectasis. Hepatobiliary: No focal liver abnormality is seen. Status post cholecystectomy. No biliary dilatation. Pancreas: Unremarkable. No pancreatic ductal dilatation or surrounding inflammatory changes. Spleen: Normal in size without focal abnormality. Adrenals/Urinary Tract: Right adrenal gland appears normal. 3.5 cm left adrenal lesion is noted with average Hounsfield measurement of under 10 most consistent with benign adenoma. Small nonobstructive right renal calculus is noted. Exophytic right renal cyst is noted. Mild bilateral hydroureteronephrosis is noted without obstructing calculus. Moderate urinary bladder distention is noted. This is concerning for possible bladder outlet obstruction. There is noted some gas within the anterior portion of the urinary bladder wall. This most likely represents benign pneumatosis but possible cystitis cannot be excluded. Stomach/Bowel: Stomach is within normal limits. Appendix appears normal. No evidence of bowel wall thickening, distention, or inflammatory changes. Vascular/Lymphatic:  Aortic atherosclerosis. No enlarged abdominal or pelvic lymph nodes. Reproductive: Prostate is unremarkable. Other: Moderate size fat containing left inguinal hernia is noted. No ascites is noted. Musculoskeletal: No acute or significant osseous findings. IMPRESSION: Mild bilateral hydroureteronephrosis is noted without obstructing ureteral calculus. Moderate urinary bladder distention is noted and this is concerning for possible bladder outlet obstruction. Small amount emphysematous changes noted in the anterior portion of the urinary bladder wall which may represent benign pneumatosis, but possible emphysematous cystitis cannot be excluded. Clinical correlation is recommended. Nonobstructive right renal calculus. 3.5 cm left adrenal adenoma. Small bilateral pleural effusions with adjacent subsegmental atelectasis. Moderate size fat containing left inguinal hernia. Aortic Atherosclerosis (ICD10-I70.0). Electronically Signed   By: Marijo Conception M.D.   On: 02/28/2019 13:30   Dg Chest 2 View  Result Date: 02/23/2019 CLINICAL DATA:  Shortness of breath with exertion for several weeks. EXAM: CHEST - 2 VIEW COMPARISON:  None. FINDINGS: Thin accounting for the AP upright projection there is mild to moderate enlargement of the cardiopericardial silhouette. Blunted posterior costophrenic angles favoring small bilateral pleural effusions with passive atelectasis. No edema is observed. Mild thoracic spondylosis. IMPRESSION: 1. Mild to moderate enlargement of the cardiopericardial silhouette with suspected small bilateral pleural effusions but no current edema. Electronically Signed   By: Van Clines M.D.   On: 02/23/2019 15:00   US Renal  Result Date: 02/24/2019 CLINICAL DATA:  Acute on chronic renal failure EXAM: RENAL / URINARY TRACT ULTRASOUND COMPLETE COMPARISON:  August 30, 2018 FINDINGS: Right Kidney: Renal measurements: 12.2 x 6.3 x 6.5 cm = volume: 262 mL. Again noted is a anechoic mass which is  partially exophytic in the midpole of the right kidney measuring 2.2 x 2.4 by 2.4 cm. There is slightly increased cortical echogenicity and thinning. There is perinephric stranding seen around the right kidney. Left Kidney: Renal measurements: 14.0 x 6.3 x 5.8 cm = volume: 269 mL. There is slightly increased cortical echogenicity and thinning. There is a small amount of perinephric stranding/fluid seen. Bladder: Bladder is partially distended. IMPRESSION: Findings suggestive of chronic medical renal disease. Partially exophytic mid right kidney simple cyst. Small amount of bilateral perinephric stranding and left perinephric fluid. Electronically Signed   By: Prudencio Pair M.D.   On: 02/24/2019 09:36   Dg Chest Port 1 View  Result Date: 02/27/2019 CLINICAL DATA:  Leukocytosis EXAM: PORTABLE CHEST 1 VIEW COMPARISON:  02/23/2019 FINDINGS: Cardiomegaly with slightly worsened central vascular congestion and pleural effusions. No interval consolidation or pneumothorax. IMPRESSION: Enlarged cardiomediastinal silhouette with slightly worsened vascular congestion and small bilateral effusion Electronically Signed   By: Donavan Foil M.D.   On: 02/27/2019 17:29   Ct  Maxillofacial Wo Contrast  Result Date: 02/04/2019 CLINICAL DATA:  63 year old male with chronic sinusitis. EXAM: CT MAXILLOFACIAL WITHOUT CONTRAST TECHNIQUE: Multidetector CT images of the paranasal sinuses were obtained using the standard protocol without intravenous contrast. COMPARISON:  None. FINDINGS: Paranasal sinuses: Frontal: Mildly hyperplastic. Trace mucosal thickening, otherwise normally aerated. Patent frontal sinus drainage pathways. Ethmoid: Trace anterior ethmoid mucosal thickening, otherwise normally aerated. Maxillary: Normally aerated. Sphenoid: Mildly hyperplastic. Trace mucosal thickening on the right, otherwise normally aerated. Patent sphenoethmoidal recesses. Right ostiomeatal unit: Patent (coronal image 22). Left ostiomeatal unit:  Patent (same image). Nasal passages: Mostly rightward nasal septal deviation. Intact septum. Symmetric nasal cavity mucosal thickening (coronal image 28). No retained secretions. Pneumatized olfactory recesses. Anatomy: Mildly hyperplastic sphenoid sinuses with marginal left anterior clinoid process pneumatization. Anterior ethmoidal artery position suspected on coronal images 26 and 27 with some pneumatization superior to both notches. Keros type 2 olfactory fossa. Other: Calcified atherosclerosis at the skull base. Negative for age visible noncontrast brain parenchyma. Negative orbits soft tissues. Visualized scalp soft tissues are within normal limits. Negative visible noncontrast deep soft tissue spaces of the face. Tympanic cavities and mastoids are clear. Maxillary dentition might be absent. No acute osseous abnormality identified. IMPRESSION: 1. Trace mucosal thickening but otherwise normally aerated paranasal sinuses and patent sinus drainage pathways. 2. Mildly hyperplastic frontal and sphenoid sinuses. 3. Symmetric nasal cavity mucosal thickening raising the possibility of rhinitis. Mild rightward septal deviation. Electronically Signed   By: Genevie Ann M.D.   On: 02/04/2019 00:37    Mickey Hebel Wynetta Emery, MD  [facility] How to contact the [facility] Attending or Consulting provider [place] or covering provider during after hours [place], for this patient?  1. Check the care team in [facility] and look for a) attending/consulting [facility] provider listed and b) the [facility] team listed 2. Log into [email or web address] and use [place]'s universal password to access. If you do not have the password, please contact the hospital operator. 3. Locate the [facility] provider you are looking for under [facility] and page to a number that you can be directly reached. 4. If you still have difficulty reaching the provider, please page the [facility] (Director on Call) for the Hospitalists listed on amion for assistance.   If 7PM-7AM,  please contact night-coverage [email or web address] Password [email or web address] 02/28/2019, 2:35 PM   LOS: 5 days

## 2019-02-28 NOTE — Evaluation (Signed)
Physical Therapy Evaluation Patient Details Name: Danny [name] MRN: [id number] DOB: 1956-05-30 Today's Date: 02/28/2019   History of Present Illness  Danny [name] is a 63 y.o. male with medical history significant for hypertension, diabetes mellitus, psoriasis, chronic kidney disease, presented to the ED with complaints of difficulty breathing with exertion over the past 2 weeks.  He has not been able to lie flat to sleep and has had to sit up in a chair.  Reports cough productive of whitish sputum over this time.  He also reports abdominal bloating, and worsening lower extremity swelling.  He does not check his weights daily, but he reports a 20 pound weight gain from 260 to 280 lbs august 1st between the time period of his visits to his primary care doctor, when his weight was checked.  His weight today is 313 pounds.He denies chest pain.  He denies vomiting, no black stools.  He reports mild lower abdominal pain.  Reports he was told about a month ago that his blood count was low because of his low iron.    Clinical Impression  Patient required much time to complete functional tasks due to c/o SOB and generalized weakness, on room air with SpO2 at 89-91%, unable to take steps using RW due to fatigue, limited to bed<>w/c transfers.  Patient tolerated sitting up at bedside after therapy.  Patient will benefit from continued physical therapy in hospital and recommended venue below to increase strength, balance, endurance for safe ADLs and gait.    Follow Up Recommendations SNF    Equipment Recommendations  None recommended by PT    Recommendations for Other Services       Precautions / Restrictions Precautions Precautions: Fall Restrictions Weight Bearing Restrictions: No      Mobility  Bed Mobility Overal bed mobility: Needs Assistance Bed Mobility: Supine to Sit     Supine to sit: Min assist     General bed mobility comments: slow labored movement  Transfers Overall transfer  level: Needs assistance Equipment used: (armrest of bed and wheelchair) Transfers: Sit to/from [facility] Sit to Stand: Min assist Stand pivot transfers: Min assist       General transfer comment: unable to use RW due weakness, had to stand pivot using armrest of chair and bed rails  Ambulation/Gait Ambulation/Gait assistance: Min assist;Mod assist Gait Distance (Feet): 2 Feet Assistive device: None Gait Pattern/deviations: Decreased step length - right;Decreased step length - left;Decreased stride length Gait velocity: slow   General Gait Details: limited to 2-3 unsteady labored steps during bed<>w/c stand pivot transfers  Stairs            Wheelchair Mobility    Modified Rankin (Stroke Patients Only)       Balance Overall balance assessment: Needs assistance Sitting-balance support: Feet supported;No upper extremity supported Sitting balance-Leahy Scale: Good     Standing balance support: During functional activity;Bilateral upper extremity supported Standing balance-Leahy Scale: Fair Standing balance comment: using armrests of chair and bed rail                             Pertinent Vitals/Pain Pain Assessment: 0-10 Pain Score: 5  Pain Location: stomach with radiation around back Pain Descriptors / Indicators: Aching;Sore Pain Intervention(s): Limited activity within patient's tolerance;Monitored during session    [place] expects to be discharged to:: Private residence Living Arrangements: Alone Available Help at Discharge: Family;Available PRN/intermittently Type of Home: Apartment Home Access: Stairs  to enter Entrance Stairs-Rails: None Entrance Stairs-Number of Steps: 1 Home Layout: One level Home Equipment: [place] - 4 wheels;Shower seat;Bedside commode;Walker - 2 wheels      Prior Function Level of Independence: Independent with assistive device(s)         Comments: household and short  distanced Engineer, manufacturing        Extremity/Trunk Assessment   Upper Extremity Assessment Upper Extremity Assessment: Generalized weakness    Lower Extremity Assessment Lower Extremity Assessment: Generalized weakness    Cervical / Trunk Assessment Cervical / Trunk Assessment: Normal  Communication   Communication: No difficulties  Cognition Arousal/Alertness: Awake/alert Behavior During Therapy: WFL for tasks assessed/performed Overall Cognitive Status: Within Functional Limits for tasks assessed                                        General Comments      Exercises     Assessment/Plan    PT Assessment Patient needs continued PT services  PT Problem List Decreased strength;Decreased activity tolerance;Decreased balance;Decreased mobility       PT Treatment Interventions Functional mobility training;Therapeutic activities;Therapeutic exercise;Patient/family education;Gait training;Stair training    PT Goals (Current goals can be found in the Care Plan section)  Acute Rehab PT Goals Patient Stated Goal: return home after rehab PT Goal Formulation: With patient Time For Goal Achievement: 03/14/19 Potential to Achieve Goals: Good    Frequency Min 3X/week   Barriers to discharge        Co-evaluation               AM-PAC PT "6 Clicks" Mobility  Outcome Measure Help needed turning from your back to your side while in a flat bed without using bedrails?: None Help needed moving from lying on your back to sitting on the side of a flat bed without using bedrails?: A Little Help needed moving to and from a bed to a chair (including a wheelchair)?: A Lot Help needed standing up from a chair using your arms (e.g., wheelchair or bedside chair)?: A Lot Help needed to walk in hospital room?: A Lot Help needed climbing 3-5 steps with a railing? : Total 6 Click Score: 14    End of Session Equipment Utilized  During Treatment: Oxygen Activity Tolerance: Patient tolerated treatment well;Patient limited by fatigue Patient left: in bed;with call bell/phone within reach(seated at bedside) Nurse Communication: Mobility status PT Visit Diagnosis: Unsteadiness on feet (R26.81);Other abnormalities of gait and mobility (R26.89);Muscle weakness (generalized) (M62.81)    Time: 5188-4166 PT Time Calculation (min) (ACUTE ONLY): 36 min   Charges:   PT Evaluation $PT Eval Moderate Complexity: 1 Mod PT Treatments $Therapeutic Activity: 23-37 mins        12:29 PM, 02/28/19 Lonell Grandchild, MPT Physical Therapist with [facility] [phone] office [phone] mobile phone

## 2019-02-28 NOTE — Progress Notes (Signed)
Patient ID: Danny [name], male   DOB: 1956/03/05, 63 y.o.   MRN: [id number] S: Complaining of abdominal discomfort.  He did have a BM yesterday but still feels constipated O:BP (!) 153/68 (BP Location: Left Arm)   Pulse 69   Temp 97.9 F (36.6 C) (Oral)   Resp 20   Ht 6' (1.829 m)   Wt (!) 145.8 kg   SpO2 99%   BMI 43.59 kg/m   Intake/Output Summary (Last 24 hours) at 02/28/2019 0901 Last data filed at 02/28/2019 0515 Gross per 24 hour  Intake 600 ml  Output 1650 ml  Net -1050 ml   Intake/Output: I/O last 3 completed shifts: In: 600 [P.O.:600] Out: 3150 [Urine:3150]  Intake/Output this shift:  No intake/output data recorded. Weight change:  Gen: NAD CVS: no rub Resp: cta Abd: obese, +BS, distended, mild tenderness to deep palpation, no guarding or rebound Ext: trace-1+ pretibial edema with chronic venous stasis changes  Recent Labs  Lab 02/23/19 1440 02/23/19 1714 02/24/19 0434 02/25/19 0452 02/26/19 0418 02/27/19 0458 02/28/19 0420  NA 139  --  141 141 136 135 134*  K 4.1  --  4.1 4.4 4.4 4.0 4.2  CL 108  --  110 108 106 103 101  CO2 22  --  23 24 24 24 24   GLUCOSE 70  --  100* 113* 118* 174* 181*  BUN 43*  --  46* 50* 57* 57* 54*  CREATININE 2.86*  --  2.92* 3.38* 3.69* 3.74* 3.43*  ALBUMIN  --  2.9*  --   --  2.7* 2.8* 2.9*  CALCIUM 8.6*  --  8.7* 8.4* 7.9* 8.2* 8.4*  PHOS  --   --   --   --  5.3* 4.8* 4.3  AST  --  20  --   --   --   --   --   ALT  --  28  --   --   --   --   --    Liver Function Tests: Recent Labs  Lab 02/23/19 1714 02/26/19 0418 02/27/19 0458 02/28/19 0420  AST 20  --   --   --   ALT 28  --   --   --   ALKPHOS 116  --   --   --   BILITOT 1.2  --   --   --   PROT 7.1  --   --   --   ALBUMIN 2.9* 2.7* 2.8* 2.9*   No results for input(s): LIPASE, AMYLASE in the last 168 hours. No results for input(s): AMMONIA in the last 168 hours. CBC: Recent Labs  Lab 02/23/19 1440 02/24/19 0434 02/26/19 0418 02/27/19 0458  WBC 9.4 10.4 9.3  12.6*  NEUTROABS 7.3  --   --   --   HGB 8.8* 8.7* 7.6* 8.0*  HCT 28.2* 27.8* 24.7* 25.7*  MCV 84.9 85.5 88.2 85.4  PLT 219 235 193 204   Cardiac Enzymes: No results for input(s): CKTOTAL, CKMB, CKMBINDEX, TROPONINI in the last 168 hours. CBG: Recent Labs  Lab 02/27/19 0757 02/27/19 1107 02/27/19 1615 02/27/19 2146 02/28/19 0717  GLUCAP 139* 221* 221* 160* 164*    Iron Studies: No results for input(s): IRON, TIBC, TRANSFERRIN, FERRITIN in the last 72 hours. Studies/Results: Dg Chest Port 1 View  Result Date: 02/27/2019 CLINICAL DATA:  Leukocytosis EXAM: PORTABLE CHEST 1 VIEW COMPARISON:  02/23/2019 FINDINGS: Cardiomegaly with slightly worsened central vascular congestion and pleural effusions. No interval consolidation or pneumothorax.  IMPRESSION: Enlarged cardiomediastinal silhouette with slightly worsened vascular congestion and small bilateral effusion Electronically Signed   By: Donavan Foil M.D.   On: 02/27/2019 17:29   . allopurinol  300 mg Oral Daily  . amLODipine  10 mg Oral QHS  . enoxaparin (LOVENOX) injection  30 mg Subcutaneous Q24H  . insulin aspart  0-9 Units Subcutaneous TID WC  . insulin aspart protamine- aspart  20 Units Subcutaneous BID WC  . metoprolol succinate  100 mg Oral QPM  . polyethylene glycol  17 g Oral BID  . senna-docusate  2 tablet Oral QHS  . torsemide  20 mg Oral Daily    BMET    Component Value Date/Time   NA 134 (L) 02/28/2019 0420   K 4.2 02/28/2019 0420   CL 101 02/28/2019 0420   CO2 24 02/28/2019 0420   GLUCOSE 181 (H) 02/28/2019 0420   BUN 54 (H) 02/28/2019 0420   CREATININE 3.43 (H) 02/28/2019 0420   CALCIUM 8.4 (L) 02/28/2019 0420   GFRNONAA 18 (L) 02/28/2019 0420   GFRAA 21 (L) 02/28/2019 0420   CBC    Component Value Date/Time   WBC 12.6 (H) 02/27/2019 0458   RBC 3.01 (L) 02/27/2019 0458   HGB 8.0 (L) 02/27/2019 0458   HCT 25.7 (L) 02/27/2019 0458   PLT 204 02/27/2019 0458   MCV 85.4 02/27/2019 0458   MCH 26.6  02/27/2019 0458   MCHC 31.1 02/27/2019 0458   RDW 17.7 (H) 02/27/2019 0458   LYMPHSABS 1.2 02/23/2019 1440   MONOABS 0.5 02/23/2019 1440   EOSABS 0.3 02/23/2019 1440   BASOSABS 0.0 02/23/2019 1440    Assessment/Plan: 1. AKI/CKD stage 4- in setting of decompensated CHF, NSAID use with concomitant ACE inhibition. His lisinopril was discontinued and he has diuresed well. REnal US consistent with chronic medical renal disease. We discussed the ways to delay the progression of ckd with  1. Tight BP control (goal <130/80) 2. Tight glucose control (goal Hgb A1c <7%) 3. Use of an ACE/ARB (currently on hold due to AKI) 4. Avoidance of NSAIDS/Cox-II I"s 5. No indication for dialysis, however he will need close follow up and education as he has had progressive CKD over the past 3 years. 6. Continue to hold lisinopril 7. SPEP/UPEP negative. 8. BUN/Cr improving with po torsemide  9. Good diuresis cont with 20mg  daily of torsemide (discontinue furosemide as an outpatient 10. Will need close follow up after discharge and will arrange to be seen in the next 2-3 weeks. 2. Acute diastolic CHF/anasarca- diuresing well with lasix 40 mg IV bid, consider Cardiology evaluation given new onset CHF. Not sure if this is related to right sided CHF, progressive CKD, or nephrotic syndrome. Had ordered 24 hour protein collection, however this was cancelled. Will reorder and follow.  1. Diuresed over 7 liters since admission 2. 24 hour urine protein with 3 grams and low albumin consistent with nephrotic syndrome likely due to Diabetic nephropathy but has never had renal biopsy.  Cont to follow.  3. Continue torsemide 20 mg daily and follow response. 3. Anemia of CKD- as well as low iron stores.GivendoseofIV ferahemeand follow, however he may need ESA as an outpatient. Hgb up to 8 from 7.6. 1. Continue to follow H/H while he remains an inpatient 2. Will need outpatient ESA after discharge.  3. Will dose  with Aranesp today. 4. HTN- stable off of lisinopril.  Could add hydralazine 25mg  tid if BP's remain above goal 5. Constipation/Abdominal pain- had BM  yesterday but still uncomfortable.   Agree with CT scan of abdomen with oral contrast. 6. DM - per primary svc 7. Obesity- stressed importance of diet/weight loss 8. HLD- on statin 9. Chronic stasis dermatitis- cont with diuretics and agree with UNNA boots if tolerates 10. Disposition- will arrange for follow up at our office after discharge in the next 2-3 weeks.  F/u with his PCP.  [name], MD [employer] [phone]

## 2019-03-01 ENCOUNTER — Inpatient Hospital Stay (HOSPITAL_COMMUNITY): Payer: Medicare HMO

## 2019-03-01 LAB — RENAL FUNCTION PANEL
Albumin: 2.6 g/dL — ABNORMAL LOW (ref 3.5–5.0)
Anion gap: 8 (ref 5–15)
BUN: 57 mg/dL — ABNORMAL HIGH (ref 8–23)
CO2: 25 mmol/L (ref 22–32)
Calcium: 8.1 mg/dL — ABNORMAL LOW (ref 8.9–10.3)
Chloride: 101 mmol/L (ref 98–111)
Creatinine, Ser: 3.43 mg/dL — ABNORMAL HIGH (ref 0.61–1.24)
GFR calc Af Amer: 21 mL/min — ABNORMAL LOW (ref >60)
GFR calc non Af Amer: 18 mL/min — ABNORMAL LOW (ref >60)
Glucose, Bld: 123 mg/dL — ABNORMAL HIGH (ref 70–99)
Phosphorus: 4 mg/dL (ref 2.5–4.6)
Potassium: 3.9 mmol/L (ref 3.5–5.1)
Sodium: 134 mmol/L — ABNORMAL LOW (ref 135–145)

## 2019-03-01 LAB — GLUCOSE, CAPILLARY
Glucose-Capillary: 115 mg/dL — ABNORMAL HIGH (ref 70–99)
Glucose-Capillary: 186 mg/dL — ABNORMAL HIGH (ref 70–99)
Glucose-Capillary: 160 mg/dL — ABNORMAL HIGH (ref 70–99)
Glucose-Capillary: 209 mg/dL — ABNORMAL HIGH (ref 70–99)

## 2019-03-01 MED ORDER — LEVOFLOXACIN IN D5W 750 MG/150ML IV SOLN
750.0000 mg | INTRAVENOUS | Status: DC
  Administered 2019-03-02: 750 mg via INTRAVENOUS
  Filled 2019-03-01: qty 150

## 2019-03-01 MED ORDER — TAMSULOSIN HCL 0.4 MG PO CAPS
0.4000 mg | ORAL_CAPSULE | Freq: Every day | ORAL | Status: DC
  Administered 2019-03-01 – 2019-03-05 (×5): 0.4 mg via ORAL
  Filled 2019-03-01 (×5): qty 1

## 2019-03-01 MED ORDER — ENOXAPARIN SODIUM 40 MG/0.4ML ~~LOC~~ SOLN
40.0000 mg | SUBCUTANEOUS | Status: DC
  Administered 2019-03-01 – 2019-03-04 (×4): 40 mg via SUBCUTANEOUS
  Filled 2019-03-01 (×4): qty 0.4

## 2019-03-01 NOTE — Progress Notes (Signed)
PROGRESS NOTE  Danny [name] ATF:[id number] DOB: [birth year] DOA: 02/23/2019 PCP: Celene Squibb, MD  Brief History:  63 year old male with a history of hypertension, diabetes mellitus type 2, CKD stage III, psoriasis, venous stasis dermatitis, lymphedema presenting with 2-week history of dyspnea on exertion.  The patient states that he has had worsening lower extremity edema as well as increasing abdominal girth during this period of time.  He also describes some orthopnea type symptoms but denies any chest discomfort, fevers, chills, coughing, hemoptysis, nausea, vomiting, diarrhea, abdominal pain.  He endorses a 20 pound weight gain over the past month.  The patient states that he is supposed to be taking furosemide 40 mg daily, but he only takes 1 tablet (20 mg) daily.  The patient states that he quit smoking in 1988 after approximately 20-pack-year history.  He endorses dietary indiscretions at home. In the emergency department, the patient was afebrile hemodynamically stable with oxygen saturation 95% on room air.  Assessment/Plan: Acute Diastolic CHF/Anasarca/Fluid Overload -in part due to progression of underlying CKD -Continue furosemide IV 40 mg with plans to transition to oral torsemide 02/27/2019 per nephrology. -Echo--EF >65%, no WMA -The patient remains clinically fluid overloaded -Daily weights: Trending down with diuresis  Acute on chronic renal failure--CKD 3 -pt has progression of underlying kidney disease -Previous baseline creatinine of 1.8-1.9, new baseline seems to be around 3.45 -Monitor with diuresis -Renal ultrasound--medical renal disease, small amount bilateral perinephric stranding -24 hour urine consistent with nephrotic syndrome picture - see nephrology recommendations  Acute respiratory failure with hypoxia -stable on 3L -due to fluid overload in setting of OSA/OHS -wean oxygen as tolerated for saturation >92%  Abdominal Pain - Pt first complained of  abdominal pain to me today 9/4 and having LLQ pain. Sent for CT abdomen STAT: findings concerning for bladder outlet obstruction and mild bilateral ureterohydronephrosis.  Ordered bladder scan and foley placement.  Pt having regular bowel movements now.  Pain radiating into back likely from bladder, check urinalysis for cystitis.  Renal US in AM after decompression from foley placement.  Likely will need to keep foley until urology can see him in the office.   Essential hypertension -Continue metoprolol succinate and amlodipine -Discontinue lisinopril in the setting of worsening renal function  Diabetes mellitus type 2, uncontrolled with hyperglycemia -Hemoglobin A1c--7.1 -Holding glipizide -Continue reduced dose 70/30 insulin -NovoLog sliding scale  CBG (last 3)  Recent Labs    02/28/19 1634 02/28/19 2113 03/01/19 0815  GLUCAP 206* 145* 115*    Anemia of chronic disease/CKD -FOBT negative -Iron saturation 12%, ferritin 158 -Ferrlecit x1 -Serum Y70, folic acid WNL  Chronic constipation - Pt had large BM yesterday and 3 BMs today.  Hold laxatives now.    Bladder Outlet Obstruction Foley placed and 1775 cc cloudy, foul smelling urine returned.  Urine culture ordered and IV levaquin ordered.    UTI/Cystitis - follow urine culture.  Continue IV levofloxacin.   Bilateral Hydronephrosis - secondary to bladder outlet obstruction - repeating US renal today for surveillance.   Hyperlipidemia -Continue statin -Lipid panel LDL 46, HDL 26, TC 95.   Right ankle wound -Wound care consult appreciated -See pictures in medical record  Chronic Venous stasis dermatitis -Hold off any antibiotics at this time -Monitor clinically -Patient had difficulty tolerating Unna boots in the past  Psoriasis -follows dermatology at [facility] -last dose Siliq 02/20/19  Disposition Plan:  home 02/28/19 Family Communication:  updated sister telephone  per pt request  Consultants:   nephrology  Code Status:  FULL  DVT Prophylaxis:  [place] Lovenox   Procedures: As Listed in Progress Note Above  Antibiotics: None  Subjective: Patient reports abdominal pain is better and feeling better today.  Tolerating foley catheter, remains agreeable to SNF.   Objective: Vitals:   02/28/19 2111 02/28/19 2233 03/01/19 0017 03/01/19 0600  BP: (!) 156/62   (!) 147/69  Pulse: 79   73  Resp: 18   18  Temp: (!) 100.8 F (38.2 C) 99.7 F (37.6 C) 99.3 F (37.4 C) 98.3 F (36.8 C)  TempSrc: Oral Oral Oral Oral  SpO2: 100%   100%  Weight:      Height:        Intake/Output Summary (Last 24 hours) at 03/01/2019 1122 Last data filed at 03/01/2019 0900 Gross per 24 hour  Intake 2835.46 ml  Output 2100 ml  Net 735.46 ml   Weight change:  Exam:   General:  Pt is alert, follows commands appropriately, not in acute distress  HEENT: No icterus, No thrush, No neck mass, [place]/AT  Cardiovascular: normal S1/S2, no rubs, no gallops  Respiratory: BBS, no wheeze, no crackles, no rhonchi.  GU: foley to gravity in place. Amber urine.    Abdomen: obese, Soft/+BS, LLQ and suprapubic tenderness, mildly distended, no guarding  Extremities: 1+ BLE edema, chronic venous stasis dermatitis.  Data Reviewed: I have personally reviewed following labs and imaging studies Basic Metabolic Panel: Recent Labs  Lab 02/25/19 0452 02/26/19 0418 02/27/19 0458 02/28/19 0420 03/01/19 0613  NA 141 136 135 134* 134*  K 4.4 4.4 4.0 4.2 3.9  CL 108 106 103 101 101  CO2 24 24 24 24 25   GLUCOSE 113* 118* 174* 181* 123*  BUN 50* 57* 57* 54* 57*  CREATININE 3.38* 3.69* 3.74* 3.43* 3.43*  CALCIUM 8.4* 7.9* 8.2* 8.4* 8.1*  PHOS  --  5.3* 4.8* 4.3 4.0   Liver Function Tests: Recent Labs  Lab 02/23/19 1714 02/26/19 0418 02/27/19 0458 02/28/19 0420 03/01/19 0613  AST 20  --   --   --   --   ALT 28  --   --   --   --   ALKPHOS 116  --   --   --   --   BILITOT 1.2  --   --   --   --    PROT 7.1  --   --   --   --   ALBUMIN 2.9* 2.7* 2.8* 2.9* 2.6*   No results for input(s): LIPASE, AMYLASE in the last 168 hours. No results for input(s): AMMONIA in the last 168 hours. Coagulation Profile: No results for input(s): INR, PROTIME in the last 168 hours. CBC: Recent Labs  Lab 02/23/19 1440 02/24/19 0434 02/26/19 0418 02/27/19 0458  WBC 9.4 10.4 9.3 12.6*  NEUTROABS 7.3  --   --   --   HGB 8.8* 8.7* 7.6* 8.0*  HCT 28.2* 27.8* 24.7* 25.7*  MCV 84.9 85.5 88.2 85.4  PLT 219 235 193 204   Cardiac Enzymes: No results for input(s): CKTOTAL, CKMB, CKMBINDEX, TROPONINI in the last 168 hours. BNP: Invalid input(s): POCBNP CBG: Recent Labs  Lab 02/28/19 0717 02/28/19 1206 02/28/19 1634 02/28/19 2113 03/01/19 0815  GLUCAP 164* 173* 206* 145* 115*   HbA1C: No results for input(s): HGBA1C in the last 72 hours. Urine analysis:    Component Value Date/Time   COLORURINE YELLOW 02/28/2019 1431   APPEARANCEUR  CLOUDY (A) 02/28/2019 1431   LABSPEC 1.010 02/28/2019 1431   PHURINE 7.0 02/28/2019 1431   GLUCOSEU NEGATIVE 02/28/2019 1431   HGBUR LARGE (A) 02/28/2019 1431   BILIRUBINUR NEGATIVE 02/28/2019 1431   KETONESUR NEGATIVE 02/28/2019 1431   PROTEINUR 100 (A) 02/28/2019 1431   NITRITE NEGATIVE 02/28/2019 1431   LEUKOCYTESUR LARGE (A) 02/28/2019 1431    Recent Results (from the past 240 hour(s))  SARS Coronavirus 2 [facility] order, Performed in [facility] hospital lab) Nasopharyngeal Nasopharyngeal Swab     Status: None   Collection Time: 02/23/19  4:01 PM   Specimen: Nasopharyngeal Swab  Result Value Ref Range Status   SARS Coronavirus 2 NEGATIVE NEGATIVE Final    Comment: (NOTE) If result is NEGATIVE SARS-CoV-2 target nucleic acids are NOT DETECTED. The SARS-CoV-2 RNA is generally detectable in upper and lower  respiratory specimens during the acute phase of infection. The lowest  concentration of SARS-CoV-2 viral copies this assay can detect is 250   copies / mL. A negative result does not preclude SARS-CoV-2 infection  and should not be used as the sole basis for treatment or other  patient management decisions.  A negative result may occur with  improper specimen collection / handling, submission of specimen other  than nasopharyngeal swab, presence of viral mutation(s) within the  areas targeted by this assay, and inadequate number of viral copies  (<250 copies / mL). A negative result must be combined with clinical  observations, patient history, and epidemiological information. If result is POSITIVE SARS-CoV-2 target nucleic acids are DETECTED. The SARS-CoV-2 RNA is generally detectable in upper and lower  respiratory specimens dur ing the acute phase of infection.  Positive  results are indicative of active infection with SARS-CoV-2.  Clinical  correlation with patient history and other diagnostic information is  necessary to determine patient infection status.  Positive results do  not rule out bacterial infection or co-infection with other viruses. If result is PRESUMPTIVE POSTIVE SARS-CoV-2 nucleic acids MAY BE PRESENT.   A presumptive positive result was obtained on the submitted specimen  and confirmed on repeat testing.  While 2019 novel coronavirus  (SARS-CoV-2) nucleic acids may be present in the submitted sample  additional confirmatory testing may be necessary for epidemiological  and / or clinical management purposes  to differentiate between  SARS-CoV-2 and other Sarbecovirus currently known to infect humans.  If clinically indicated additional testing with an alternate test  methodology [phone]) is advised. The SARS-CoV-2 RNA is generally  detectable in upper and lower respiratory sp ecimens during the acute  phase of infection. The expected result is Negative. Fact Sheet for Patients:  StrictlyIdeas.no Fact Sheet for Healthcare  Providers: BankingDealers.co.za This test is not yet approved or cleared by the [place] FDA and has been authorized for detection and/or diagnosis of SARS-CoV-2 by FDA under an Emergency Use Authorization (EUA).  This EUA will remain in effect (meaning this test can be used) for the duration of the COVID-19 declaration under Section 564(b)(1) of the Act, 21 U.S.C. section 360bbb-3(b)(1), unless the authorization is terminated or revoked sooner. Performed at [facility], [address], [place], [place] [address]      Scheduled Meds:  allopurinol  300 mg Oral Daily   amLODipine  10 mg Oral QHS   enoxaparin (LOVENOX) injection  30 mg Subcutaneous Q24H   insulin aspart  0-9 Units Subcutaneous TID WC   insulin aspart protamine- aspart  20 Units Subcutaneous BID WC   metoprolol succinate  100 mg Oral QPM   torsemide  20 mg Oral Daily   Continuous Infusions:  sodium chloride 500 mL (02/28/19 1807)   levofloxacin (LEVAQUIN) IV 500 mg (02/28/19 1808)    Procedures/Studies: Ct Abdomen Pelvis Wo Contrast  Result Date: 02/28/2019 CLINICAL DATA:  History of diverticulitis.  Abdominal distension. EXAM: CT ABDOMEN AND PELVIS WITHOUT CONTRAST TECHNIQUE: Multidetector CT imaging of the abdomen and pelvis was performed following the standard protocol without IV contrast. COMPARISON:  None. FINDINGS: Lower chest: Small bilateral pleural effusions are noted with minimal adjacent subsegmental atelectasis. Hepatobiliary: No focal liver abnormality is seen. Status post cholecystectomy. No biliary dilatation. Pancreas: Unremarkable. No pancreatic ductal dilatation or surrounding inflammatory changes. Spleen: Normal in size without focal abnormality. Adrenals/Urinary Tract: Right adrenal gland appears normal. 3.5 cm left adrenal lesion is noted with average Hounsfield measurement of under 10 most consistent with benign adenoma. Small nonobstructive right renal calculus is  noted. Exophytic right renal cyst is noted. Mild bilateral hydroureteronephrosis is noted without obstructing calculus. Moderate urinary bladder distention is noted. This is concerning for possible bladder outlet obstruction. There is noted some gas within the anterior portion of the urinary bladder wall. This most likely represents benign pneumatosis but possible cystitis cannot be excluded. Stomach/Bowel: Stomach is within normal limits. Appendix appears normal. No evidence of bowel wall thickening, distention, or inflammatory changes. Vascular/Lymphatic: Aortic atherosclerosis. No enlarged abdominal or pelvic lymph nodes. Reproductive: Prostate is unremarkable. Other: Moderate size fat containing left inguinal hernia is noted. No ascites is noted. Musculoskeletal: No acute or significant osseous findings. IMPRESSION: Mild bilateral hydroureteronephrosis is noted without obstructing ureteral calculus. Moderate urinary bladder distention is noted and this is concerning for possible bladder outlet obstruction. Small amount emphysematous changes noted in the anterior portion of the urinary bladder wall which may represent benign pneumatosis, but possible emphysematous cystitis cannot be excluded. Clinical correlation is recommended. Nonobstructive right renal calculus. 3.5 cm left adrenal adenoma. Small bilateral pleural effusions with adjacent subsegmental atelectasis. Moderate size fat containing left inguinal hernia. Aortic Atherosclerosis (ICD10-I70.0). Electronically Signed   By: Marijo Conception M.D.   On: 02/28/2019 13:30   Dg Chest 2 View  Result Date: 02/23/2019 CLINICAL DATA:  Shortness of breath with exertion for several weeks. EXAM: CHEST - 2 VIEW COMPARISON:  None. FINDINGS: Thin accounting for the AP upright projection there is mild to moderate enlargement of the cardiopericardial silhouette. Blunted posterior costophrenic angles favoring small bilateral pleural effusions with passive atelectasis. No  edema is observed. Mild thoracic spondylosis. IMPRESSION: 1. Mild to moderate enlargement of the cardiopericardial silhouette with suspected small bilateral pleural effusions but no current edema. Electronically Signed   By: Van Clines M.D.   On: 02/23/2019 15:00   US Renal  Result Date: 02/24/2019 CLINICAL DATA:  Acute on chronic renal failure EXAM: RENAL / URINARY TRACT ULTRASOUND COMPLETE COMPARISON:  August 30, 2018 FINDINGS: Right Kidney: Renal measurements: 12.2 x 6.3 x 6.5 cm = volume: 262 mL. Again noted is a anechoic mass which is partially exophytic in the midpole of the right kidney measuring 2.2 x 2.4 by 2.4 cm. There is slightly increased cortical echogenicity and thinning. There is perinephric stranding seen around the right kidney. Left Kidney: Renal measurements: 14.0 x 6.3 x 5.8 cm = volume: 269 mL. There is slightly increased cortical echogenicity and thinning. There is a small amount of perinephric stranding/fluid seen. Bladder: Bladder is partially distended. IMPRESSION: Findings suggestive of chronic medical renal disease. Partially exophytic mid right kidney simple  cyst. Small amount of bilateral perinephric stranding and left perinephric fluid. Electronically Signed   By: Prudencio Pair M.D.   On: 02/24/2019 09:36   Dg Chest Port 1 View  Result Date: 02/27/2019 CLINICAL DATA:  Leukocytosis EXAM: PORTABLE CHEST 1 VIEW COMPARISON:  02/23/2019 FINDINGS: Cardiomegaly with slightly worsened central vascular congestion and pleural effusions. No interval consolidation or pneumothorax. IMPRESSION: Enlarged cardiomediastinal silhouette with slightly worsened vascular congestion and small bilateral effusion Electronically Signed   By: Donavan Foil M.D.   On: 02/27/2019 17:29   Ct Maxillofacial Wo Contrast  Result Date: 02/04/2019 CLINICAL DATA:  63 year old male with chronic sinusitis. EXAM: CT MAXILLOFACIAL WITHOUT CONTRAST TECHNIQUE: Multidetector CT images of the paranasal sinuses  were obtained using the standard protocol without intravenous contrast. COMPARISON:  None. FINDINGS: Paranasal sinuses: Frontal: Mildly hyperplastic. Trace mucosal thickening, otherwise normally aerated. Patent frontal sinus drainage pathways. Ethmoid: Trace anterior ethmoid mucosal thickening, otherwise normally aerated. Maxillary: Normally aerated. Sphenoid: Mildly hyperplastic. Trace mucosal thickening on the right, otherwise normally aerated. Patent sphenoethmoidal recesses. Right ostiomeatal unit: Patent (coronal image 22). Left ostiomeatal unit: Patent (same image). Nasal passages: Mostly rightward nasal septal deviation. Intact septum. Symmetric nasal cavity mucosal thickening (coronal image 28). No retained secretions. Pneumatized olfactory recesses. Anatomy: Mildly hyperplastic sphenoid sinuses with marginal left anterior clinoid process pneumatization. Anterior ethmoidal artery position suspected on coronal images 26 and 27 with some pneumatization superior to both notches. Keros type 2 olfactory fossa. Other: Calcified atherosclerosis at the skull base. Negative for age visible noncontrast brain parenchyma. Negative orbits soft tissues. Visualized scalp soft tissues are within normal limits. Negative visible noncontrast deep soft tissue spaces of the face. Tympanic cavities and mastoids are clear. Maxillary dentition might be absent. No acute osseous abnormality identified. IMPRESSION: 1. Trace mucosal thickening but otherwise normally aerated paranasal sinuses and patent sinus drainage pathways. 2. Mildly hyperplastic frontal and sphenoid sinuses. 3. Symmetric nasal cavity mucosal thickening raising the possibility of rhinitis. Mild rightward septal deviation. Electronically Signed   By: Genevie Ann M.D.   On: 02/04/2019 00:37    Airlie Blumenberg Wynetta Emery, MD  [facility] How to contact the [facility] Attending or Consulting provider [place] or covering provider during after hours [place], for this patient?   1. Check the care team in [facility] and look for a) attending/consulting [facility] provider listed and b) the [facility] team listed 2. Log into [email or web address] and use [place]'s universal password to access. If you do not have the password, please contact the hospital operator. 3. Locate the [facility] provider you are looking for under [facility] and page to a number that you can be directly reached. 4. If you still have difficulty reaching the provider, please page the [facility] (Director on Call) for the Hospitalists listed on amion for assistance.   If 7PM-7AM, please contact night-coverage [email or web address] Password [email or web address] 03/01/2019, 11:22 AM   LOS: 6 days

## 2019-03-02 LAB — RENAL FUNCTION PANEL
Albumin: 2.5 g/dL — ABNORMAL LOW (ref 3.5–5.0)
Anion gap: 8 (ref 5–15)
BUN: 61 mg/dL — ABNORMAL HIGH (ref 8–23)
CO2: 26 mmol/L (ref 22–32)
Calcium: 8.2 mg/dL — ABNORMAL LOW (ref 8.9–10.3)
Chloride: 102 mmol/L (ref 98–111)
Creatinine, Ser: 3.41 mg/dL — ABNORMAL HIGH (ref 0.61–1.24)
GFR calc Af Amer: 21 mL/min — ABNORMAL LOW (ref >60)
GFR calc non Af Amer: 18 mL/min — ABNORMAL LOW (ref >60)
Glucose, Bld: 151 mg/dL — ABNORMAL HIGH (ref 70–99)
Phosphorus: 3.3 mg/dL (ref 2.5–4.6)
Potassium: 3.8 mmol/L (ref 3.5–5.1)
Sodium: 136 mmol/L (ref 135–145)

## 2019-03-02 LAB — GLUCOSE, CAPILLARY
Glucose-Capillary: 155 mg/dL — ABNORMAL HIGH (ref 70–99)
Glucose-Capillary: 236 mg/dL — ABNORMAL HIGH (ref 70–99)
Glucose-Capillary: 224 mg/dL — ABNORMAL HIGH (ref 70–99)
Glucose-Capillary: 178 mg/dL — ABNORMAL HIGH (ref 70–99)

## 2019-03-02 MED ORDER — DEXTROMETHORPHAN POLISTIREX ER 30 MG/5ML PO SUER
30.0000 mg | Freq: Two times a day (BID) | ORAL | Status: DC | PRN
  Administered 2019-03-04 – 2019-03-05 (×3): 30 mg via ORAL
  Filled 2019-03-02 (×3): qty 5

## 2019-03-02 MED ORDER — FOLIC ACID 1 MG PO TABS
1.0000 mg | ORAL_TABLET | Freq: Every day | ORAL | Status: DC
  Administered 2019-03-02 – 2019-03-05 (×4): 1 mg via ORAL
  Filled 2019-03-02 (×4): qty 1

## 2019-03-02 MED ORDER — INSULIN ASPART PROT & ASPART (70-30 MIX) 100 UNIT/ML ~~LOC~~ SUSP
25.0000 [IU] | Freq: Two times a day (BID) | SUBCUTANEOUS | Status: DC
  Administered 2019-03-02 – 2019-03-05 (×7): 25 [IU] via SUBCUTANEOUS
  Filled 2019-03-02: qty 10

## 2019-03-02 MED ORDER — GUAIFENESIN ER 600 MG PO TB12
600.0000 mg | ORAL_TABLET | Freq: Two times a day (BID) | ORAL | Status: DC
  Administered 2019-03-02 – 2019-03-04 (×5): 600 mg via ORAL
  Filled 2019-03-02 (×7): qty 1

## 2019-03-02 NOTE — Progress Notes (Signed)
PROGRESS NOTE  Danny [name] IRW:[id number] DOB: [birth year] DOA: 02/23/2019 PCP: Celene Squibb, MD  Brief History:  63 year old male with a history of hypertension, diabetes mellitus type 2, CKD stage III, psoriasis, venous stasis dermatitis, lymphedema presenting with 2-week history of dyspnea on exertion.  The patient states that he has had worsening lower extremity edema as well as increasing abdominal girth during this period of time.  He also describes some orthopnea type symptoms but denies any chest discomfort, fevers, chills, coughing, hemoptysis, nausea, vomiting, diarrhea, abdominal pain.  He endorses a 20 pound weight gain over the past month.  The patient states that he is supposed to be taking furosemide 40 mg daily, but he only takes 1 tablet (20 mg) daily.  The patient states that he quit smoking in 1988 after approximately 20-pack-year history.  He endorses dietary indiscretions at home.  In the emergency department, the patient was afebrile hemodynamically stable with oxygen saturation 95% on room air.  Assessment/Plan: Acute Diastolic CHF/Anasarca/Fluid Overload -in part due to progression of underlying CKD -Continue furosemide IV 40 mg with plans to transition to oral torsemide 02/27/2019 per nephrology. -Echo--EF >65%, no WMA -The patient remains clinically fluid overloaded -Daily weights: Trending down with diuresis  Acute on chronic renal failure--CKD 3 -pt has progression of underlying kidney disease -Previous baseline creatinine of 1.8-1.9, new baseline seems to be around 3.45 -Monitor with diuresis -Renal ultrasound--medical renal disease, small amount bilateral perinephric stranding -24 hour urine consistent with nephrotic syndrome picture - see nephrology recommendations  Acute respiratory failure with hypoxia -stable on 3L -due to fluid overload in setting of OSA/OHS -wean oxygen as tolerated for saturation >92%  Abdominal Pain - Pt first complained of  abdominal pain to me today 9/4 and having LLQ pain. Sent for CT abdomen STAT: findings concerning for bladder outlet obstruction and mild bilateral ureterohydronephrosis.  Ordered bladder scan and foley placement.  Pt having regular bowel movements now.  Pain radiating into back likely from bladder, check urinalysis for cystitis.  Renal US shows resolution of hydronephrosis after decompression from foley placement.  Likely will need to keep foley until urology can see him in the office.   Essential hypertension -Continue metoprolol succinate and amlodipine -Discontinue lisinopril in the setting of worsening renal function  Diabetes mellitus type 2, uncontrolled with hyperglycemia -Hemoglobin A1c--7.1 -Holding glipizide -Continue reduced dose 70/30 insulin -NovoLog sliding scale  CBG (last 3)  Recent Labs    03/01/19 2015 03/02/19 0722 03/02/19 1114  GLUCAP 209* 155* 236*    Anemia of chronic disease/CKD -FOBT negative -Iron saturation 12%, ferritin 158 -Ferrlecit x1 -Serum P61, folic acid WNL  Chronic constipation - Pt had large BM now having loose stools.  Hold laxatives now.    Bladder Outlet Obstruction Foley placed and 1775 cc cloudy, foul smelling urine returned.  Urine culture ordered and IV levaquin ordered.    UTI/Cystitis - follow urine culture.  Continue IV levofloxacin for 3 full days of coverage.   Bilateral Hydronephrosis - secondary to bladder outlet obstruction - repeating US renal today for surveillance.   Hyperlipidemia -Continue statin -Lipid panel LDL 46, HDL 26, TC 95.   Right ankle wound -Wound care consult appreciated -See pictures in medical record  Chronic Venous stasis dermatitis -Hold off any antibiotics at this time -Monitor clinically -Patient had difficulty tolerating Unna boots in the past  Psoriasis -follows dermatology at [facility] -last dose Siliq 02/20/19  Disposition Plan:  home 02/28/19 Family Communication:  updated sister  telephone per pt request  Consultants:  nephrology  Code Status:  FULL  DVT Prophylaxis:  [place] Lovenox  Procedures: As Listed in Progress Note Above  Antibiotics: None  Subjective: Patient reports having loose stools.   Objective: Vitals:   03/01/19 1323 03/01/19 2132 03/02/19 0500 03/02/19 0502  BP: 129/67 (!) 129/51  (!) 133/58  Pulse: 71 74  75  Resp: 20 17  17   Temp: 98.2 F (36.8 C) 98.3 F (36.8 C)  98.6 F (37 C)  TempSrc: Oral   Oral  SpO2: 97% 97%  95%  Weight:   (!) 145.8 kg   Height:        Intake/Output Summary (Last 24 hours) at 03/02/2019 1158 Last data filed at 03/02/2019 0942 Gross per 24 hour  Intake 1200 ml  Output 3250 ml  Net -2050 ml   Weight change:  Exam:   General:  Pt is alert, follows commands appropriately, not in acute distress  HEENT: No icterus, No thrush, No neck mass, [place]/AT  Cardiovascular: normal S1/S2, no rubs, no gallops  Respiratory: BBS, no wheeze, no crackles, no rhonchi.  GU: foley to gravity in place. Amber urine.    Abdomen: obese, Soft/+BS, LLQ and suprapubic tenderness, mildly distended, no guarding  Extremities: 1+ BLE edema, chronic venous stasis dermatitis.  Data Reviewed: I have personally reviewed following labs and imaging studies Basic Metabolic Panel: Recent Labs  Lab 02/26/19 0418 02/27/19 0458 02/28/19 0420 03/01/19 0613 03/02/19 0648  NA 136 135 134* 134* 136  K 4.4 4.0 4.2 3.9 3.8  CL 106 103 101 101 102  CO2 24 24 24 25 26   GLUCOSE 118* 174* 181* 123* 151*  BUN 57* 57* 54* 57* 61*  CREATININE 3.69* 3.74* 3.43* 3.43* 3.41*  CALCIUM 7.9* 8.2* 8.4* 8.1* 8.2*  PHOS 5.3* 4.8* 4.3 4.0 3.3   Liver Function Tests: Recent Labs  Lab 02/23/19 1714 02/26/19 0418 02/27/19 0458 02/28/19 0420 03/01/19 0613 03/02/19 0648  AST 20  --   --   --   --   --   ALT 28  --   --   --   --   --   ALKPHOS 116  --   --   --   --   --   BILITOT 1.2  --   --   --   --   --   PROT 7.1  --   --   --    --   --   ALBUMIN 2.9* 2.7* 2.8* 2.9* 2.6* 2.5*   No results for input(s): LIPASE, AMYLASE in the last 168 hours. No results for input(s): AMMONIA in the last 168 hours. Coagulation Profile: No results for input(s): INR, PROTIME in the last 168 hours. CBC: Recent Labs  Lab 02/23/19 1440 02/24/19 0434 02/26/19 0418 02/27/19 0458  WBC 9.4 10.4 9.3 12.6*  NEUTROABS 7.3  --   --   --   HGB 8.8* 8.7* 7.6* 8.0*  HCT 28.2* 27.8* 24.7* 25.7*  MCV 84.9 85.5 88.2 85.4  PLT 219 235 193 204   Cardiac Enzymes: No results for input(s): CKTOTAL, CKMB, CKMBINDEX, TROPONINI in the last 168 hours. BNP: Invalid input(s): POCBNP CBG: Recent Labs  Lab 03/01/19 1141 03/01/19 1620 03/01/19 2015 03/02/19 0722 03/02/19 1114  GLUCAP 186* 160* 209* 155* 236*   HbA1C: No results for input(s): HGBA1C in the last 72 hours. Urine analysis:    Component Value Date/Time  COLORURINE YELLOW 02/28/2019 1431   APPEARANCEUR CLOUDY (A) 02/28/2019 1431   LABSPEC 1.010 02/28/2019 1431   PHURINE 7.0 02/28/2019 1431   GLUCOSEU NEGATIVE 02/28/2019 1431   HGBUR LARGE (A) 02/28/2019 1431   BILIRUBINUR NEGATIVE 02/28/2019 1431   KETONESUR NEGATIVE 02/28/2019 1431   PROTEINUR 100 (A) 02/28/2019 1431   NITRITE NEGATIVE 02/28/2019 1431   LEUKOCYTESUR LARGE (A) 02/28/2019 1431    Recent Results (from the past 240 hour(s))  SARS Coronavirus 2 [facility] order, Performed in [facility] hospital lab) Nasopharyngeal Nasopharyngeal Swab     Status: None   Collection Time: 02/23/19  4:01 PM   Specimen: Nasopharyngeal Swab  Result Value Ref Range Status   SARS Coronavirus 2 NEGATIVE NEGATIVE Final    Comment: (NOTE) If result is NEGATIVE SARS-CoV-2 target nucleic acids are NOT DETECTED. The SARS-CoV-2 RNA is generally detectable in upper and lower  respiratory specimens during the acute phase of infection. The lowest  concentration of SARS-CoV-2 viral copies this assay can detect is 250  copies / mL. A  negative result does not preclude SARS-CoV-2 infection  and should not be used as the sole basis for treatment or other  patient management decisions.  A negative result may occur with  improper specimen collection / handling, submission of specimen other  than nasopharyngeal swab, presence of viral mutation(s) within the  areas targeted by this assay, and inadequate number of viral copies  (<250 copies / mL). A negative result must be combined with clinical  observations, patient history, and epidemiological information. If result is POSITIVE SARS-CoV-2 target nucleic acids are DETECTED. The SARS-CoV-2 RNA is generally detectable in upper and lower  respiratory specimens dur ing the acute phase of infection.  Positive  results are indicative of active infection with SARS-CoV-2.  Clinical  correlation with patient history and other diagnostic information is  necessary to determine patient infection status.  Positive results do  not rule out bacterial infection or co-infection with other viruses. If result is PRESUMPTIVE POSTIVE SARS-CoV-2 nucleic acids MAY BE PRESENT.   A presumptive positive result was obtained on the submitted specimen  and confirmed on repeat testing.  While 2019 novel coronavirus  (SARS-CoV-2) nucleic acids may be present in the submitted sample  additional confirmatory testing may be necessary for epidemiological  and / or clinical management purposes  to differentiate between  SARS-CoV-2 and other Sarbecovirus currently known to infect humans.  If clinically indicated additional testing with an alternate test  methodology [phone]) is advised. The SARS-CoV-2 RNA is generally  detectable in upper and lower respiratory sp ecimens during the acute  phase of infection. The expected result is Negative. Fact Sheet for Patients:  StrictlyIdeas.no Fact Sheet for Healthcare Providers: BankingDealers.co.za This test is not  yet approved or cleared by the [place] FDA and has been authorized for detection and/or diagnosis of SARS-CoV-2 by FDA under an Emergency Use Authorization (EUA).  This EUA will remain in effect (meaning this test can be used) for the duration of the COVID-19 declaration under Section 564(b)(1) of the Act, 21 U.S.C. section 360bbb-3(b)(1), unless the authorization is terminated or revoked sooner. Performed at [facility], [address] [place], [place] [address]   Culture, Urine     Status: Abnormal (Preliminary result)   Collection Time: 02/28/19  2:31 PM   Specimen: Urine, Clean Catch  Result Value Ref Range Status   Specimen Description   Final    URINE, CLEAN CATCH Performed at [facility], [address] [place], [place] [address]    Special Requests   Final    NONE Performed at [facility], [address] [place], [place] [address]    Culture >=100,000 COLONIES/mL GRAM NEGATIVE RODS (A)  Final   Report Status PENDING  Incomplete     Scheduled Meds:  allopurinol  300 mg Oral Daily   amLODipine  10 mg Oral QHS   enoxaparin (LOVENOX) injection  40 mg Subcutaneous Q24H   insulin aspart  0-9 Units Subcutaneous TID WC   insulin aspart protamine- aspart  20 Units Subcutaneous BID WC   metoprolol succinate  100 mg Oral QPM   tamsulosin  0.4 mg Oral QPC supper   torsemide  20 mg Oral Daily   Continuous Infusions:  sodium chloride 500 mL (02/28/19 1807)   levofloxacin (LEVAQUIN) IV      Procedures/Studies: Ct Abdomen Pelvis Wo Contrast  Result Date: 02/28/2019 CLINICAL DATA:  History of diverticulitis.  Abdominal distension. EXAM: CT ABDOMEN AND PELVIS WITHOUT CONTRAST TECHNIQUE: Multidetector CT imaging of the abdomen and pelvis was performed following the standard protocol without IV contrast. COMPARISON:  None. FINDINGS: Lower chest: Small bilateral pleural effusions are noted with minimal adjacent subsegmental atelectasis. Hepatobiliary: No focal liver  abnormality is seen. Status post cholecystectomy. No biliary dilatation. Pancreas: Unremarkable. No pancreatic ductal dilatation or surrounding inflammatory changes. Spleen: Normal in size without focal abnormality. Adrenals/Urinary Tract: Right adrenal gland appears normal. 3.5 cm left adrenal lesion is noted with average Hounsfield measurement of under 10 most consistent with benign adenoma. Small nonobstructive right renal calculus is noted. Exophytic right renal cyst is noted. Mild bilateral hydroureteronephrosis is noted without obstructing calculus. Moderate urinary bladder distention is noted. This is concerning for possible bladder outlet obstruction. There is noted some gas within the anterior portion of the urinary bladder wall. This most likely represents benign pneumatosis but possible cystitis cannot be excluded. Stomach/Bowel: Stomach is within normal limits. Appendix appears normal. No evidence of bowel wall thickening, distention, or inflammatory changes. Vascular/Lymphatic: Aortic atherosclerosis. No enlarged abdominal or pelvic lymph nodes. Reproductive: Prostate is unremarkable. Other: Moderate size fat containing left inguinal hernia is noted. No ascites is noted. Musculoskeletal: No acute or significant osseous findings. IMPRESSION: Mild bilateral hydroureteronephrosis is noted without obstructing ureteral calculus. Moderate urinary bladder distention is noted and this is concerning for possible bladder outlet obstruction. Small amount emphysematous changes noted in the anterior portion of the urinary bladder wall which may represent benign pneumatosis, but possible emphysematous cystitis cannot be excluded. Clinical correlation is recommended. Nonobstructive right renal calculus. 3.5 cm left adrenal adenoma. Small bilateral pleural effusions with adjacent subsegmental atelectasis. Moderate size fat containing left inguinal hernia. Aortic Atherosclerosis (ICD10-I70.0). Electronically Signed    By: Marijo Conception M.D.   On: 02/28/2019 13:30   Dg Chest 2 View  Result Date: 02/23/2019 CLINICAL DATA:  Shortness of breath with exertion for several weeks. EXAM: CHEST - 2 VIEW COMPARISON:  None. FINDINGS: Thin accounting for the AP upright projection there is mild to moderate enlargement of the cardiopericardial silhouette. Blunted posterior costophrenic angles favoring small bilateral pleural effusions with passive atelectasis. No edema is observed. Mild thoracic spondylosis. IMPRESSION: 1. Mild to moderate enlargement of the cardiopericardial silhouette with suspected small bilateral pleural effusions but no current edema. Electronically Signed   By: Van Clines M.D.   On: 02/23/2019 15:00   US Renal  Result Date: 03/01/2019 CLINICAL DATA:  Follow up hydronephrosis seen on previous day CT. EXAM: RENAL /  URINARY TRACT ULTRASOUND COMPLETE COMPARISON:  CT abdomen pelvis 02/28/2019 FINDINGS: Right Kidney: Renal measurements: 10.7 x 5.1 x 6.8 cm = volume: 194 mL . Echogenicity within normal limits. Right renal cyst measuring 2.2 cm as seen on prior CT. No hydronephrosis visualized. Left Kidney: Renal measurements: 11.5 x 5.9 x 5.2 cm = volume: 186 mL. Echogenicity within normal limits. No mass or hydronephrosis visualized. Bladder: The bladder is decompressed. IMPRESSION: No sonographic evidence of hydronephrosis in the bilateral kidneys. Electronically Signed   By: Audie Pinto M.D.   On: 03/01/2019 12:45   US Renal  Result Date: 02/24/2019 CLINICAL DATA:  Acute on chronic renal failure EXAM: RENAL / URINARY TRACT ULTRASOUND COMPLETE COMPARISON:  August 30, 2018 FINDINGS: Right Kidney: Renal measurements: 12.2 x 6.3 x 6.5 cm = volume: 262 mL. Again noted is a anechoic mass which is partially exophytic in the midpole of the right kidney measuring 2.2 x 2.4 by 2.4 cm. There is slightly increased cortical echogenicity and thinning. There is perinephric stranding seen around the right kidney.  Left Kidney: Renal measurements: 14.0 x 6.3 x 5.8 cm = volume: 269 mL. There is slightly increased cortical echogenicity and thinning. There is a small amount of perinephric stranding/fluid seen. Bladder: Bladder is partially distended. IMPRESSION: Findings suggestive of chronic medical renal disease. Partially exophytic mid right kidney simple cyst. Small amount of bilateral perinephric stranding and left perinephric fluid. Electronically Signed   By: Prudencio Pair M.D.   On: 02/24/2019 09:36   Dg Chest Port 1 View  Result Date: 02/27/2019 CLINICAL DATA:  Leukocytosis EXAM: PORTABLE CHEST 1 VIEW COMPARISON:  02/23/2019 FINDINGS: Cardiomegaly with slightly worsened central vascular congestion and pleural effusions. No interval consolidation or pneumothorax. IMPRESSION: Enlarged cardiomediastinal silhouette with slightly worsened vascular congestion and small bilateral effusion Electronically Signed   By: Donavan Foil M.D.   On: 02/27/2019 17:29   Ct Maxillofacial Wo Contrast  Result Date: 02/04/2019 CLINICAL DATA:  63 year old male with chronic sinusitis. EXAM: CT MAXILLOFACIAL WITHOUT CONTRAST TECHNIQUE: Multidetector CT images of the paranasal sinuses were obtained using the standard protocol without intravenous contrast. COMPARISON:  None. FINDINGS: Paranasal sinuses: Frontal: Mildly hyperplastic. Trace mucosal thickening, otherwise normally aerated. Patent frontal sinus drainage pathways. Ethmoid: Trace anterior ethmoid mucosal thickening, otherwise normally aerated. Maxillary: Normally aerated. Sphenoid: Mildly hyperplastic. Trace mucosal thickening on the right, otherwise normally aerated. Patent sphenoethmoidal recesses. Right ostiomeatal unit: Patent (coronal image 22). Left ostiomeatal unit: Patent (same image). Nasal passages: Mostly rightward nasal septal deviation. Intact septum. Symmetric nasal cavity mucosal thickening (coronal image 28). No retained secretions. Pneumatized olfactory recesses.  Anatomy: Mildly hyperplastic sphenoid sinuses with marginal left anterior clinoid process pneumatization. Anterior ethmoidal artery position suspected on coronal images 26 and 27 with some pneumatization superior to both notches. Keros type 2 olfactory fossa. Other: Calcified atherosclerosis at the skull base. Negative for age visible noncontrast brain parenchyma. Negative orbits soft tissues. Visualized scalp soft tissues are within normal limits. Negative visible noncontrast deep soft tissue spaces of the face. Tympanic cavities and mastoids are clear. Maxillary dentition might be absent. No acute osseous abnormality identified. IMPRESSION: 1. Trace mucosal thickening but otherwise normally aerated paranasal sinuses and patent sinus drainage pathways. 2. Mildly hyperplastic frontal and sphenoid sinuses. 3. Symmetric nasal cavity mucosal thickening raising the possibility of rhinitis. Mild rightward septal deviation. Electronically Signed   By: Genevie Ann M.D.   On: 02/04/2019 00:37    Sharry Beining Wynetta Emery, MD  [facility] How to contact the [facility] Attending  or Consulting provider [place] or covering provider during after hours [place], for this patient?  1. Check the care team in [facility] and look for a) attending/consulting [facility] provider listed and b) the [facility] team listed 2. Log into [email or web address] and use [place]'s universal password to access. If you do not have the password, please contact the hospital operator. 3. Locate the [facility] provider you are looking for under [facility] and page to a number that you can be directly reached. 4. If you still have difficulty reaching the provider, please page the [facility] (Director on Call) for the Hospitalists listed on amion for assistance.   If 7PM-7AM, please contact night-coverage [email or web address] Password [email or web address] 03/02/2019, 11:58 AM   LOS: 7 days

## 2019-03-03 ENCOUNTER — Inpatient Hospital Stay (HOSPITAL_COMMUNITY): Payer: Medicare HMO

## 2019-03-03 DIAGNOSIS — L97529 Non-pressure chronic ulcer of other part of left foot with unspecified severity: Secondary | ICD-10-CM | POA: Diagnosis present

## 2019-03-03 DIAGNOSIS — L03115 Cellulitis of right lower limb: Secondary | ICD-10-CM | POA: Clinically undetermined

## 2019-03-03 DIAGNOSIS — N3090 Cystitis, unspecified without hematuria: Secondary | ICD-10-CM | POA: Diagnosis present

## 2019-03-03 DIAGNOSIS — R339 Retention of urine, unspecified: Secondary | ICD-10-CM | POA: Diagnosis not present

## 2019-03-03 DIAGNOSIS — B9689 Other specified bacterial agents as the cause of diseases classified elsewhere: Secondary | ICD-10-CM | POA: Diagnosis present

## 2019-03-03 DIAGNOSIS — N390 Urinary tract infection, site not specified: Secondary | ICD-10-CM | POA: Diagnosis present

## 2019-03-03 DIAGNOSIS — R338 Other retention of urine: Secondary | ICD-10-CM | POA: Diagnosis not present

## 2019-03-03 LAB — RENAL FUNCTION PANEL
Albumin: 2.4 g/dL — ABNORMAL LOW (ref 3.5–5.0)
Anion gap: 8 (ref 5–15)
BUN: 62 mg/dL — ABNORMAL HIGH (ref 8–23)
CO2: 25 mmol/L (ref 22–32)
Calcium: 8.2 mg/dL — ABNORMAL LOW (ref 8.9–10.3)
Chloride: 104 mmol/L (ref 98–111)
Creatinine, Ser: 3.36 mg/dL — ABNORMAL HIGH (ref 0.61–1.24)
GFR calc Af Amer: 21 mL/min — ABNORMAL LOW (ref >60)
GFR calc non Af Amer: 18 mL/min — ABNORMAL LOW (ref >60)
Glucose, Bld: 136 mg/dL — ABNORMAL HIGH (ref 70–99)
Phosphorus: 3.4 mg/dL (ref 2.5–4.6)
Potassium: 3.7 mmol/L (ref 3.5–5.1)
Sodium: 137 mmol/L (ref 135–145)

## 2019-03-03 LAB — URINE CULTURE: Culture: >=100000 — AB

## 2019-03-03 LAB — GLUCOSE, CAPILLARY
Glucose-Capillary: 144 mg/dL — ABNORMAL HIGH (ref 70–99)
Glucose-Capillary: 141 mg/dL — ABNORMAL HIGH (ref 70–99)
Glucose-Capillary: 194 mg/dL — ABNORMAL HIGH (ref 70–99)
Glucose-Capillary: 155 mg/dL — ABNORMAL HIGH (ref 70–99)
Glucose-Capillary: 175 mg/dL — ABNORMAL HIGH (ref 70–99)

## 2019-03-03 MED ORDER — SENNOSIDES-DOCUSATE SODIUM 8.6-50 MG PO TABS
2.0000 | ORAL_TABLET | Freq: Once | ORAL | Status: AC
  Administered 2019-03-03: 2 via ORAL
  Filled 2019-03-03: qty 2

## 2019-03-03 MED ORDER — CEFAZOLIN SODIUM-DEXTROSE 2-4 GM/100ML-% IV SOLN
2.0000 g | Freq: Three times a day (TID) | INTRAVENOUS | Status: DC
  Administered 2019-03-03 – 2019-03-04 (×3): 2 g via INTRAVENOUS
  Filled 2019-03-03 (×3): qty 100

## 2019-03-03 NOTE — Care Management Important Message (Signed)
Important Message  Patient Details  Name: Danny [name] MRN: [id number] Date of Birth: [birth year]   Medicare Important Message Given:  Yes     [name] 03/03/2019, 10:19 AM

## 2019-03-03 NOTE — Progress Notes (Signed)
PROGRESS NOTE  Danny [name] MPN:[id number] DOB: [birth year] DOA: 02/23/2019 PCP: Celene Squibb, MD  Brief History:  63 year old male with a history of hypertension, diabetes mellitus type 2, CKD stage III, psoriasis, venous stasis dermatitis, lymphedema presenting with 2-week history of dyspnea on exertion.  The patient states that he has had worsening lower extremity edema as well as increasing abdominal girth during this period of time.  He also describes some orthopnea type symptoms but denies any chest discomfort, fevers, chills, coughing, hemoptysis, nausea, vomiting, diarrhea, abdominal pain.  He endorses a 20 pound weight gain over the past month.  The patient states that he is supposed to be taking furosemide 40 mg daily, but he only takes 1 tablet (20 mg) daily.  The patient states that he quit smoking in 1988 after approximately 20-pack-year history.  He endorses dietary indiscretions at home.  In the emergency department, the patient was afebrile hemodynamically stable with oxygen saturation 95% on room air.  Assessment/Plan: Acute Diastolic CHF/Anasarca/Fluid Overload -in part due to progression of underlying CKD -Continue furosemide IV 40 mg with plans to transition to oral torsemide 02/27/2019 per nephrology. -Echo--EF >65%, no WMA -The patient remains clinically fluid overloaded -Daily weights: Trending down with diuresis  Acute on chronic renal failure--CKD 3 -pt has progression of underlying kidney disease -Previous baseline creatinine of 1.8-1.9, new baseline seems to be around 3.45 -Monitor with diuresis -Renal ultrasound--medical renal disease, small amount bilateral perinephric stranding -24 hour urine consistent with nephrotic syndrome picture - see nephrology recommendations  Acute respiratory failure with hypoxia -stable on 3L -due to fluid overload in setting of OSA/OHS -wean oxygen as tolerated for saturation >92%  Abdominal Pain - Resolved now.  Pt  first complained of abdominal pain to me today 9/4 and having LLQ pain. Sent for CT abdomen STAT: findings concerning for bladder outlet obstruction and mild bilateral ureterohydronephrosis.  Ordered bladder scan and foley placement.  Pt having regular bowel movements now.  Pain radiating into back likely from bladder, check urinalysis for cystitis.  Renal US shows resolution of hydronephrosis after decompression from foley placement.  Likely will need to keep foley until urology can see him in the office.   Essential hypertension -Continue metoprolol succinate and amlodipine -Discontinue lisinopril in the setting of worsening renal function  Diabetes mellitus type 2, uncontrolled with hyperglycemia -Hemoglobin A1c--7.1 -Holding glipizide -Continue reduced dose 70/30 insulin -NovoLog sliding scale  CBG (last 3)  Recent Labs    03/03/19 0322 03/03/19 0736 03/03/19 1114  GLUCAP 144* 141* 194*    Anemia of chronic disease/CKD -FOBT negative -Iron saturation 12%, ferritin 158 -Ferrlecit x1 -Serum M08, folic acid WNL  Chronic constipation - Pt had large BM.  Laxatives ordered as needed.     Bladder Outlet Obstruction / Acute Urinary Retention  Foley placed and 1775 mL cloudy, foul smelling urine returned.  Urine culture ordered and IV levaquin ordered.    Klebsiella UTI/Cystitis - urine culture sensitive to cipro and cefazolin.  Treated with renally dosed IV levofloxacin for 3 full days of coverage and now is on cefazolin for cellulitis.   Nonpurulent Cellulitis BLEs - Elevate legs, IV cefazolin ordered.  Follow clinically.  Culture pending.    Plantar Ulceration left foot - Continue local wound care.  Check ABI study of LLE.    Bilateral Hydronephrosis - secondary to bladder outlet obstruction - repeated US renal with no findings of hydronephrosis.   Hyperlipidemia -Continue  statin -Lipid panel LDL 46, HDL 26, TC 95.   Right ankle wound -Wound care consult  appreciated -See pictures in medical record  Chronic Venous stasis dermatitis -Hold off any antibiotics at this time -Monitor clinically -Patient had difficulty tolerating Unna boots in the past  Psoriasis -follows dermatology at [facility] -last dose Siliq 02/20/19  Disposition Plan:  home 02/28/19 Family Communication:  updated sister telephone per pt request 03/03/19  Consultants:  nephrology  DVT Prophylaxis:  Willard Lovenox  Procedures: As Listed in Progress Note Above  Antibiotics: None  Subjective: Patient reports more constipation, redness and tenderness both legs and left foot ulceration returning after it had healed.   Objective: Vitals:   03/02/19 0502 03/02/19 1500 03/02/19 2022 03/03/19 0650  BP: (!) 133/58 135/60 (!) 141/61 (!) 143/68  Pulse: 75 74 80 72  Resp: 17 17 18 20   Temp: 98.6 F (37 C) 98.4 F (36.9 C) 98.8 F (37.1 C) 98.6 F (37 C)  TempSrc: Oral Oral Oral Oral  SpO2: 95% 96% 98% 99%  Weight:    (!) 139.4 kg  Height:        Intake/Output Summary (Last 24 hours) at 03/03/2019 1227 Last data filed at 03/03/2019 0900 Gross per 24 hour  Intake 870.48 ml  Output 2650 ml  Net -1779.52 ml   Weight change: -6.4 kg Exam:   General:  Pt is alert, follows commands appropriately, not in acute distress  HEENT: No icterus, No thrush, No neck mass, [place]/AT  Cardiovascular: normal S1/S2, no rubs, no gallops  Respiratory: BBS, no wheeze, no crackles, no rhonchi.  GU: foley to gravity in place. Amber urine.    Abdomen: obese, Soft/+BS, LLQ and suprapubic tenderness, mildly distended, no guarding  Extremities: 1+ BLE edema, chronic venous stasis dermatitis and redness and warmth concerning for cellulitis BLEs.      Left foot plantar ulceration        Data Reviewed: I have personally reviewed following labs and imaging studies Basic Metabolic Panel: Recent Labs  Lab 02/27/19 0458 02/28/19 0420 03/01/19 0613 03/02/19 0648  03/03/19 0445  NA 135 134* 134* 136 137  K 4.0 4.2 3.9 3.8 3.7  CL 103 101 101 102 104  CO2 24 24 25 26 25   GLUCOSE 174* 181* 123* 151* 136*  BUN 57* 54* 57* 61* 62*  CREATININE 3.74* 3.43* 3.43* 3.41* 3.36*  CALCIUM 8.2* 8.4* 8.1* 8.2* 8.2*  PHOS 4.8* 4.3 4.0 3.3 3.4   Liver Function Tests: Recent Labs  Lab 02/27/19 0458 02/28/19 0420 03/01/19 0613 03/02/19 0648 03/03/19 0445  ALBUMIN 2.8* 2.9* 2.6* 2.5* 2.4*   No results for input(s): LIPASE, AMYLASE in the last 168 hours. No results for input(s): AMMONIA in the last 168 hours. Coagulation Profile: No results for input(s): INR, PROTIME in the last 168 hours. CBC: Recent Labs  Lab 02/26/19 0418 02/27/19 0458  WBC 9.3 12.6*  HGB 7.6* 8.0*  HCT 24.7* 25.7*  MCV 88.2 85.4  PLT 193 204   Cardiac Enzymes: No results for input(s): CKTOTAL, CKMB, CKMBINDEX, TROPONINI in the last 168 hours. BNP: Invalid input(s): POCBNP CBG: Recent Labs  Lab 03/02/19 1558 03/02/19 2023 03/03/19 0322 03/03/19 0736 03/03/19 1114  GLUCAP 224* 178* 144* 141* 194*   HbA1C: No results for input(s): HGBA1C in the last 72 hours. Urine analysis:    Component Value Date/Time   COLORURINE YELLOW 02/28/2019 1431   APPEARANCEUR CLOUDY (A) 02/28/2019 1431   LABSPEC 1.010 02/28/2019 1431   PHURINE 7.0  02/28/2019 1431   GLUCOSEU NEGATIVE 02/28/2019 1431   HGBUR LARGE (A) 02/28/2019 1431   BILIRUBINUR NEGATIVE 02/28/2019 1431   KETONESUR NEGATIVE 02/28/2019 1431   PROTEINUR 100 (A) 02/28/2019 1431   NITRITE NEGATIVE 02/28/2019 1431   LEUKOCYTESUR LARGE (A) 02/28/2019 1431    Recent Results (from the past 240 hour(s))  SARS Coronavirus 2 [facility] order, Performed in [facility] hospital lab) Nasopharyngeal Nasopharyngeal Swab     Status: None   Collection Time: 02/23/19  4:01 PM   Specimen: Nasopharyngeal Swab  Result Value Ref Range Status   SARS Coronavirus 2 NEGATIVE NEGATIVE Final    Comment: (NOTE) If result is  NEGATIVE SARS-CoV-2 target nucleic acids are NOT DETECTED. The SARS-CoV-2 RNA is generally detectable in upper and lower  respiratory specimens during the acute phase of infection. The lowest  concentration of SARS-CoV-2 viral copies this assay can detect is 250  copies / mL. A negative result does not preclude SARS-CoV-2 infection  and should not be used as the sole basis for treatment or other  patient management decisions.  A negative result may occur with  improper specimen collection / handling, submission of specimen other  than nasopharyngeal swab, presence of viral mutation(s) within the  areas targeted by this assay, and inadequate number of viral copies  (<250 copies / mL). A negative result must be combined with clinical  observations, patient history, and epidemiological information. If result is POSITIVE SARS-CoV-2 target nucleic acids are DETECTED. The SARS-CoV-2 RNA is generally detectable in upper and lower  respiratory specimens dur ing the acute phase of infection.  Positive  results are indicative of active infection with SARS-CoV-2.  Clinical  correlation with patient history and other diagnostic information is  necessary to determine patient infection status.  Positive results do  not rule out bacterial infection or co-infection with other viruses. If result is PRESUMPTIVE POSTIVE SARS-CoV-2 nucleic acids MAY BE PRESENT.   A presumptive positive result was obtained on the submitted specimen  and confirmed on repeat testing.  While 2019 novel coronavirus  (SARS-CoV-2) nucleic acids may be present in the submitted sample  additional confirmatory testing may be necessary for epidemiological  and / or clinical management purposes  to differentiate between  SARS-CoV-2 and other Sarbecovirus currently known to infect humans.  If clinically indicated additional testing with an alternate test  methodology [phone]) is advised. The SARS-CoV-2 RNA is generally  detectable  in upper and lower respiratory sp ecimens during the acute  phase of infection. The expected result is Negative. Fact Sheet for Patients:  StrictlyIdeas.no Fact Sheet for Healthcare Providers: BankingDealers.co.za This test is not yet approved or cleared by the [place] FDA and has been authorized for detection and/or diagnosis of SARS-CoV-2 by FDA under an Emergency Use Authorization (EUA).  This EUA will remain in effect (meaning this test can be used) for the duration of the COVID-19 declaration under Section 564(b)(1) of the Act, 21 U.S.C. section 360bbb-3(b)(1), unless the authorization is terminated or revoked sooner. Performed at  [facility], [address] [place], [place] [address]   Culture, Urine     Status: Abnormal   Collection Time: 02/28/19  2:31 PM   Specimen: Urine, Clean Catch  Result Value Ref Range Status   Specimen Description   Final    URINE, CLEAN CATCH Performed at [facility], [address], [place], [place] [address]    Special Requests   Final    NONE Performed at [facility], [address], [place], [place] [address]    Culture >=100,000 COLONIES/mL KLEBSIELLA PNEUMONIAE (A)  Final   Report Status 03/03/2019 FINAL  Final   Organism ID, Bacteria KLEBSIELLA PNEUMONIAE (A)  Final      Susceptibility   Klebsiella pneumoniae - MIC*    AMPICILLIN >=32 RESISTANT Resistant     CEFAZOLIN <=4 SENSITIVE Sensitive     CEFTRIAXONE <=1 SENSITIVE Sensitive     CIPROFLOXACIN <=0.25 SENSITIVE Sensitive     GENTAMICIN <=1 SENSITIVE Sensitive     IMIPENEM <=0.25 SENSITIVE Sensitive     NITROFURANTOIN 128 RESISTANT Resistant     TRIMETH/SULFA <=20 SENSITIVE Sensitive     AMPICILLIN/SULBACTAM 4 SENSITIVE Sensitive     PIP/TAZO <=4 SENSITIVE Sensitive     Extended ESBL NEGATIVE Sensitive     * >=100,000 COLONIES/mL KLEBSIELLA PNEUMONIAE     Scheduled Meds:  allopurinol  300 mg Oral Daily   amLODipine  10  mg Oral QHS   enoxaparin (LOVENOX) injection  40 mg Subcutaneous Y30Z   folic acid  1 mg Oral Daily   guaiFENesin  600 mg Oral BID   insulin aspart  0-9 Units Subcutaneous TID WC   insulin aspart protamine- aspart  25 Units Subcutaneous BID WC   metoprolol succinate  100 mg Oral QPM   tamsulosin  0.4 mg Oral QPC supper   torsemide  20 mg Oral Daily   Continuous Infusions:  sodium chloride Stopped (03/02/19 1532)    ceFAZolin (ANCEF) IV     levofloxacin (LEVAQUIN) IV Stopped (03/02/19 1702)    Procedures/Studies: Ct Abdomen Pelvis Wo Contrast  Result Date: 02/28/2019 CLINICAL DATA:  History of diverticulitis.  Abdominal distension. EXAM: CT ABDOMEN AND PELVIS WITHOUT CONTRAST TECHNIQUE: Multidetector CT imaging of the abdomen and pelvis was performed following the standard protocol without IV contrast. COMPARISON:  None. FINDINGS: Lower chest: Small bilateral pleural effusions are noted with minimal adjacent subsegmental atelectasis. Hepatobiliary: No focal liver abnormality is seen. Status post cholecystectomy. No biliary dilatation. Pancreas: Unremarkable. No pancreatic ductal dilatation or surrounding inflammatory changes. Spleen: Normal in size without focal abnormality. Adrenals/Urinary Tract: Right adrenal gland appears normal. 3.5 cm left adrenal lesion is noted with average Hounsfield measurement of under 10 most consistent with benign adenoma. Small nonobstructive right renal calculus is noted. Exophytic right renal cyst is noted. Mild bilateral hydroureteronephrosis is noted without obstructing calculus. Moderate urinary bladder distention is noted. This is concerning for possible bladder outlet obstruction. There is noted some gas within the anterior portion of the urinary bladder wall. This most likely represents benign pneumatosis but possible cystitis cannot be excluded. Stomach/Bowel: Stomach is within normal limits. Appendix appears normal. No evidence of bowel wall  thickening, distention, or inflammatory changes. Vascular/Lymphatic: Aortic atherosclerosis. No enlarged abdominal or pelvic lymph nodes. Reproductive: Prostate is unremarkable. Other: Moderate size fat containing left inguinal hernia is noted. No ascites is noted. Musculoskeletal: No acute or significant osseous findings. IMPRESSION: Mild bilateral hydroureteronephrosis is noted without obstructing ureteral calculus. Moderate urinary bladder distention is noted and this is concerning for possible bladder outlet obstruction. Small amount emphysematous changes noted in the anterior portion of the urinary bladder wall which may represent benign pneumatosis, but possible emphysematous cystitis cannot be excluded. Clinical correlation is recommended. Nonobstructive right renal calculus. 3.5 cm left adrenal adenoma. Small bilateral pleural effusions with adjacent subsegmental atelectasis. Moderate size fat containing left inguinal hernia. Aortic Atherosclerosis (ICD10-I70.0). Electronically Signed   By: Marijo Conception M.D.   On: 02/28/2019 13:30  Dg Chest 2 View  Result Date: 02/23/2019 CLINICAL DATA:  Shortness of breath with exertion for several weeks. EXAM: CHEST - 2 VIEW COMPARISON:  None. FINDINGS: Thin accounting for the AP upright projection there is mild to moderate enlargement of the cardiopericardial silhouette. Blunted posterior costophrenic angles favoring small bilateral pleural effusions with passive atelectasis. No edema is observed. Mild thoracic spondylosis. IMPRESSION: 1. Mild to moderate enlargement of the cardiopericardial silhouette with suspected small bilateral pleural effusions but no current edema. Electronically Signed   By: Van Clines M.D.   On: 02/23/2019 15:00   US Renal  Result Date: 03/01/2019 CLINICAL DATA:  Follow up hydronephrosis seen on previous day CT. EXAM: RENAL / URINARY TRACT ULTRASOUND COMPLETE COMPARISON:  CT abdomen pelvis 02/28/2019 FINDINGS: Right Kidney:  Renal measurements: 10.7 x 5.1 x 6.8 cm = volume: 194 mL . Echogenicity within normal limits. Right renal cyst measuring 2.2 cm as seen on prior CT. No hydronephrosis visualized. Left Kidney: Renal measurements: 11.5 x 5.9 x 5.2 cm = volume: 186 mL. Echogenicity within normal limits. No mass or hydronephrosis visualized. Bladder: The bladder is decompressed. IMPRESSION: No sonographic evidence of hydronephrosis in the bilateral kidneys. Electronically Signed   By: Audie Pinto M.D.   On: 03/01/2019 12:45   US Renal  Result Date: 02/24/2019 CLINICAL DATA:  Acute on chronic renal failure EXAM: RENAL / URINARY TRACT ULTRASOUND COMPLETE COMPARISON:  August 30, 2018 FINDINGS: Right Kidney: Renal measurements: 12.2 x 6.3 x 6.5 cm = volume: 262 mL. Again noted is a anechoic mass which is partially exophytic in the midpole of the right kidney measuring 2.2 x 2.4 by 2.4 cm. There is slightly increased cortical echogenicity and thinning. There is perinephric stranding seen around the right kidney. Left Kidney: Renal measurements: 14.0 x 6.3 x 5.8 cm = volume: 269 mL. There is slightly increased cortical echogenicity and thinning. There is a small amount of perinephric stranding/fluid seen. Bladder: Bladder is partially distended. IMPRESSION: Findings suggestive of chronic medical renal disease. Partially exophytic mid right kidney simple cyst. Small amount of bilateral perinephric stranding and left perinephric fluid. Electronically Signed   By: Prudencio Pair M.D.   On: 02/24/2019 09:36   Dg Chest Port 1 View  Result Date: 02/27/2019 CLINICAL DATA:  Leukocytosis EXAM: PORTABLE CHEST 1 VIEW COMPARISON:  02/23/2019 FINDINGS: Cardiomegaly with slightly worsened central vascular congestion and pleural effusions. No interval consolidation or pneumothorax. IMPRESSION: Enlarged cardiomediastinal silhouette with slightly worsened vascular congestion and small bilateral effusion Electronically Signed   By: Donavan Foil M.D.    On: 02/27/2019 17:29   Ct Maxillofacial Wo Contrast  Result Date: 02/04/2019 CLINICAL DATA:  64 year old male with chronic sinusitis. EXAM: CT MAXILLOFACIAL WITHOUT CONTRAST TECHNIQUE: Multidetector CT images of the paranasal sinuses were obtained using the standard protocol without intravenous contrast. COMPARISON:  None. FINDINGS: Paranasal sinuses: Frontal: Mildly hyperplastic. Trace mucosal thickening, otherwise normally aerated. Patent frontal sinus drainage pathways. Ethmoid: Trace anterior ethmoid mucosal thickening, otherwise normally aerated. Maxillary: Normally aerated. Sphenoid: Mildly hyperplastic. Trace mucosal thickening on the right, otherwise normally aerated. Patent sphenoethmoidal recesses. Right ostiomeatal unit: Patent (coronal image 22). Left ostiomeatal unit: Patent (same image). Nasal passages: Mostly rightward nasal septal deviation. Intact septum. Symmetric nasal cavity mucosal thickening (coronal image 28). No retained secretions. Pneumatized olfactory recesses. Anatomy: Mildly hyperplastic sphenoid sinuses with marginal left anterior clinoid process pneumatization. Anterior ethmoidal artery position suspected on coronal images 26 and 27 with some pneumatization superior to both notches. Keros type 2 olfactory  fossa. Other: Calcified atherosclerosis at the skull base. Negative for age visible noncontrast brain parenchyma. Negative orbits soft tissues. Visualized scalp soft tissues are within normal limits. Negative visible noncontrast deep soft tissue spaces of the face. Tympanic cavities and mastoids are clear. Maxillary dentition might be absent. No acute osseous abnormality identified. IMPRESSION: 1. Trace mucosal thickening but otherwise normally aerated paranasal sinuses and patent sinus drainage pathways. 2. Mildly hyperplastic frontal and sphenoid sinuses. 3. Symmetric nasal cavity mucosal thickening raising the possibility of rhinitis. Mild rightward septal deviation.  Electronically Signed   By: Genevie Ann M.D.   On: 02/04/2019 00:37    Kazi Montoro Wynetta Emery, MD  [facility] How to contact the [facility] Attending or Consulting provider [place] or covering provider during after hours [place], for this patient?  1. Check the care team in [facility] and look for a) attending/consulting [facility] provider listed and b) the [facility] team listed 2. Log into [email or web address] and use [place]'s universal password to access. If you do not have the password, please contact the hospital operator. 3. Locate the [facility] provider you are looking for under [facility] and page to a number that you can be directly reached. 4. If you still have difficulty reaching the provider, please page the [facility] (Director on Call) for the Hospitalists listed on amion for assistance.   If 7PM-7AM, please contact night-coverage [email or web address] Password [email or web address] 03/03/2019, 12:27 PM   LOS: 8 days

## 2019-03-03 NOTE — Progress Notes (Signed)
[facility] Progress Note    Assessment/ Plan:   1. AKI/CKD stage 4- in setting of decompensated CHF, NSAID use with concomitant ACE inhibition. His lisinopril was discontinued and he has diuresed well. REnal US consistent with chronic medical renal disease. We discussed the ways to delay the progression of ckd with  1. Tight BP control (goal <130/80) 2. Tight glucose control (goal Hgb A1c <7%) 3. ACE/ARB (currently on hold due to AKI) -> may not be able to restart bec of degree of renal dysfunction 4. Avoidance of NSAIDS/Cox-II I"s 5. No indication for dialysis, however he will need close follow up and education as he has had progressive CKD over the past 3 years. 6. Continue to hold lisinopril 7. SPEP/UPEP negative. 8. BUN/Cr improving with po torsemide -> continue 20mg  daily of torsemide (discontinue furosemide as an outpatient) -> he is responding and fortunately renal function is stable. 9. Will need close follow up after discharge and will arrange to be seen in the next 2-3 weeks with [facility]. 2. Acute diastolic CHF/anasarca- Not sure if this is related to right sided CHF, progressive CKD, or nephrotic syndrome.  1. Diuresed over 11.3 liters since admission 2. 24 hour urine protein with 3 grams and low albumin consistent with nephrotic syndrome likely due to Diabetic nephropathy but has never had renal biopsy. Cont to follow. May have component of secondary FSGS as well. 3. Continue torsemide 20 mg daily and follow response. 3. Anemia of CKD- as well as low iron stores.GivendoseofIV ferahemeand follow, however he may need ESA as an outpatient. Hgbup to 8 from7.6. 1. Continue to follow H/H while he remains an inpatient 2. Will need outpatient ESA after discharge.  3. I don't see where he was  dosed with Aranesp -> will recheck CBC in AM and dose if needed. Has received Nulecit (last Hb 8 on 9/3). 4. HTN- stableoff of lisinopril. Could add hydralazine 25mg  tid  if BP's remain above goal 5. Constipation/Abdominal pain- becoming constipated again. 6. DM - per primary svc 7. Obesity- stressed importance of diet/weight loss 8. HLD- on statin 9. Chronic stasis dermatitis- cont with diuretics and agree with UNNA boots if tolerates 10. Disposition- will arrange for follow up at our office after discharge in the next 2-3 weeks.  F/u with his PCP.  Subjective:   Constipated (no BM yesterday) Denies f/c/n/v/ dyspnea/ CP.   Objective:   BP (!) 143/68   Pulse 72   Temp 98.6 F (37 C) (Oral)   Resp 20   Ht 6' (1.829 m)   Wt (!) 139.4 kg   SpO2 99%   BMI 41.68 kg/m   Intake/Output Summary (Last 24 hours) at 03/03/2019 1425 Last data filed at 03/03/2019 0900 Gross per 24 hour  Intake 630.48 ml  Output 2650 ml  Net -2019.52 ml   Weight change: -6.4 kg  Physical Exam: Gen: NAD CVS: no rub Resp: cta Abd: obese, +BS, no rebound or guarding Ext: trace-1+ pretibial edema with chronic venous stasis changes   Imaging: No results found.  Labs: BMET Recent Labs  Lab 02/25/19 0452 02/26/19 0418 02/27/19 0458 02/28/19 0420 03/01/19 0613 03/02/19 0648 03/03/19 0445  NA 141 136 135 134* 134* 136 137  K 4.4 4.4 4.0 4.2 3.9 3.8 3.7  CL 108 106 103 101 101 102 104  CO2 24 24 24 24 25 26 25   GLUCOSE 113* 118* 174* 181* 123* 151* 136*  BUN 50* 57* 57* 54* 57* 61* 62*  CREATININE  3.38* 3.69* 3.74* 3.43* 3.43* 3.41* 3.36*  CALCIUM 8.4* 7.9* 8.2* 8.4* 8.1* 8.2* 8.2*  PHOS  --  5.3* 4.8* 4.3 4.0 3.3 3.4   CBC Recent Labs  Lab 02/26/19 0418 02/27/19 0458  WBC 9.3 12.6*  HGB 7.6* 8.0*  HCT 24.7* 25.7*  MCV 88.2 85.4  PLT 193 204    Medications:    . allopurinol  300 mg Oral Daily  . amLODipine  10 mg Oral QHS  . enoxaparin (LOVENOX) injection  40 mg Subcutaneous Q24H  . folic acid  1 mg Oral Daily  . guaiFENesin  600 mg Oral BID  . insulin aspart  0-9 Units Subcutaneous TID WC  . insulin aspart protamine- aspart  25 Units  Subcutaneous BID WC  . metoprolol succinate  100 mg Oral QPM  . tamsulosin  0.4 mg Oral QPC supper  . torsemide  20 mg Oral Daily      Otelia Santee, MD 03/03/2019, 2:25 PM

## 2019-03-03 NOTE — Progress Notes (Signed)
Initial Nutrition Assessment  DOCUMENTATION CODES:   Morbid obesity  INTERVENTION:  Heart Failure education provided with handouts to include healthy eating for wt loss   NUTRITION DIAGNOSIS:   Food and nutrition related knowledge deficit related to limited prior education(HF and Eating for wt loss) as evidenced by per patient/family report.   GOAL:   (Patient will review handouts for greater understanding and execution of lifestyle changes once he returns home)  MONITOR:   PO intake, Weight trends, Labs, I & O's, Skin REASON FOR ASSESSMENT:   LOS    ASSESSMENT: Patient is an obese 63 yo male with a hx of DM-2, CKD-3,  HTN and Foot ulcer. He presents with acute CHF, anasarca and fluid overload. Daily weights. Diuresis in process. Weight decreased from 142-139.4 kg (313-307 lb).  Patient is eating 100% of meals. At home is eats mostly at home and cooks regularly. Eating pattern is usually late morning meal and late afternoon and a snack in the evening. Reviewed Heart Failure Nutrition Therapy with him and provided Eat Right for Your Weight handouts.    Intake/Output Summary (Last 24 hours) at 03/03/2019 1752 Last data filed at 03/03/2019 1300 Gross per 24 hour  Intake 870.48 ml  Output 1850 ml  Net -979.52 ml    Medications reviewed and include: allopurinol, SSI, Folvite, Demadex  Labs: BMP Latest Ref Rng & Units 03/03/2019 03/02/2019 03/01/2019  Glucose 70 - 99 mg/dL 136(H) 151(H) 123(H)  BUN 8 - 23 mg/dL 62(H) 61(H) 57(H)  Creatinine 0.61 - 1.24 mg/dL 3.36(H) 3.41(H) 3.43(H)  Sodium 135 - 145 mmol/L 137 136 134(L)  Potassium 3.5 - 5.1 mmol/L 3.7 3.8 3.9  Chloride 98 - 111 mmol/L 104 102 101  CO2 22 - 32 mmol/L 25 26 25   Calcium 8.9 - 10.3 mg/dL 8.2(L) 8.2(L) 8.1(L)     NUTRITION - FOCUSED PHYSICAL EXAM: Findings are no fat depletion, no muscle depletion, and mild edema.     Diet Order:   Diet Order            Diet heart healthy/carb modified Room service  appropriate? Yes; Fluid consistency: Thin  Diet effective now              EDUCATION NEEDS:   Education needs have been addressed Skin:  Skin Assessment: Skin Integrity Issues: Skin Integrity Issues:: Diabetic Ulcer Diabetic Ulcer: platar foot with moderate purulent drainage/redness and multiple abrasions to BLE  Last BM:  9/6  Height:   Ht Readings from Last 1 Encounters:  02/23/19 6' (1.829 m)    Weight:   Wt Readings from Last 1 Encounters:  03/03/19 (!) 139.4 kg    Ideal Body Weight:  81 kg  BMI:  Body mass index is 41.68 kg/m.  Estimated Nutritional Needs:   Kcal:  2200-2300 (MSJ x 1.1 AF +/- 100 kcal)  Protein:  65-73 gr  Fluid:  per MD goal  Colman Cater MS,RD,CSG,LDN Office: [phone] Pager: [phone]

## 2019-03-04 LAB — RENAL FUNCTION PANEL
Albumin: 2.3 g/dL — ABNORMAL LOW (ref 3.5–5.0)
Anion gap: 8 (ref 5–15)
BUN: 60 mg/dL — ABNORMAL HIGH (ref 8–23)
CO2: 25 mmol/L (ref 22–32)
Calcium: 8.2 mg/dL — ABNORMAL LOW (ref 8.9–10.3)
Chloride: 105 mmol/L (ref 98–111)
Creatinine, Ser: 3.35 mg/dL — ABNORMAL HIGH (ref 0.61–1.24)
GFR calc Af Amer: 21 mL/min — ABNORMAL LOW (ref >60)
GFR calc non Af Amer: 18 mL/min — ABNORMAL LOW (ref >60)
Glucose, Bld: 122 mg/dL — ABNORMAL HIGH (ref 70–99)
Phosphorus: 3.8 mg/dL (ref 2.5–4.6)
Potassium: 3.7 mmol/L (ref 3.5–5.1)
Sodium: 138 mmol/L (ref 135–145)

## 2019-03-04 LAB — CBC WITH DIFFERENTIAL/PLATELET
Abs Immature Granulocytes: 0.07 10*3/uL (ref 0.00–0.07)
Basophils Absolute: 0 10*3/uL (ref 0.0–0.1)
Basophils Relative: 0 %
Eosinophils Absolute: 0.3 10*3/uL (ref 0.0–0.5)
Eosinophils Relative: 2 %
HCT: 25.4 % — ABNORMAL LOW (ref 39.0–52.0)
Hemoglobin: 8.2 g/dL — ABNORMAL LOW (ref 13.0–17.0)
Immature Granulocytes: 1 %
Lymphocytes Relative: 7 %
Lymphs Abs: 0.9 10*3/uL (ref 0.7–4.0)
MCH: 27.1 pg (ref 26.0–34.0)
MCHC: 32.3 g/dL (ref 30.0–36.0)
MCV: 83.8 fL (ref 80.0–100.0)
Monocytes Absolute: 1 10*3/uL (ref 0.1–1.0)
Monocytes Relative: 7 %
Neutro Abs: 10.5 10*3/uL — ABNORMAL HIGH (ref 1.7–7.7)
Neutrophils Relative %: 83 %
Platelets: 221 10*3/uL (ref 150–400)
RBC: 3.03 MIL/uL — ABNORMAL LOW (ref 4.22–5.81)
RDW: 17.9 % — ABNORMAL HIGH (ref 11.5–15.5)
WBC: 12.8 10*3/uL — ABNORMAL HIGH (ref 4.0–10.5)
nRBC: 0 % (ref 0.0–0.2)

## 2019-03-04 LAB — GLUCOSE, CAPILLARY
Glucose-Capillary: 139 mg/dL — ABNORMAL HIGH (ref 70–99)
Glucose-Capillary: 119 mg/dL — ABNORMAL HIGH (ref 70–99)
Glucose-Capillary: 131 mg/dL — ABNORMAL HIGH (ref 70–99)
Glucose-Capillary: 158 mg/dL — ABNORMAL HIGH (ref 70–99)

## 2019-03-04 LAB — MAGNESIUM: Magnesium: 2 mg/dL (ref 1.7–2.4)

## 2019-03-04 MED ORDER — DARBEPOETIN ALFA 60 MCG/0.3ML IJ SOSY
60.0000 ug | PREFILLED_SYRINGE | Freq: Once | INTRAMUSCULAR | Status: AC
  Administered 2019-03-04: 60 ug via SUBCUTANEOUS
  Filled 2019-03-04: qty 0.3

## 2019-03-04 MED ORDER — SACCHAROMYCES BOULARDII 250 MG PO CAPS
250.0000 mg | ORAL_CAPSULE | Freq: Two times a day (BID) | ORAL | Status: DC
  Administered 2019-03-04 – 2019-03-05 (×3): 250 mg via ORAL
  Filled 2019-03-04 (×3): qty 1

## 2019-03-04 MED ORDER — CEFAZOLIN SODIUM-DEXTROSE 2-4 GM/100ML-% IV SOLN
2.0000 g | Freq: Two times a day (BID) | INTRAVENOUS | Status: DC
  Administered 2019-03-04 – 2019-03-05 (×2): 2 g via INTRAVENOUS
  Filled 2019-03-04 (×2): qty 100

## 2019-03-04 MED ORDER — SODIUM CHLORIDE 0.9 % IV SOLN
510.0000 mg | Freq: Once | INTRAVENOUS | Status: AC
  Administered 2019-03-04: 510 mg via INTRAVENOUS
  Filled 2019-03-04: qty 17

## 2019-03-04 NOTE — Progress Notes (Signed)
Physical Therapy Treatment Patient Details Name: Danny [name] MRN: [id number] DOB: [birth year] Today's Date: 03/04/2019    History of Present Illness Danny [name] is a 63 y.o. male with medical history significant for hypertension, diabetes mellitus, psoriasis, chronic kidney disease, presented to the ED with complaints of difficulty breathing with exertion over the past 2 weeks.  He has not been able to lie flat to sleep and has had to sit up in a chair.  Reports cough productive of whitish sputum over this time.  He also reports abdominal bloating, and worsening lower extremity swelling.  He does not check his weights daily, but he reports a 20 pound weight gain from 260 to 280 lbs august 1st between the time period of his visits to his primary care doctor, when his weight was checked.  His weight today is 313 pounds.He denies chest pain.  He denies vomiting, no black stools.  He reports mild lower abdominal pain.  Reports he was told about a month ago that his blood count was low because of his low iron.    PT Comments    Patient presents seated in chair (assisted by nursing staff) and agreeable for therapy.  Patient demonstrates good return for completing BLE/ROM exercises with c/o discomfort in right ankle due to old injury, increased tolerance for taking steps while followed with wheelchair for safety, limited to ambulation in room due to c/o fatigue and fear of falling.  Patient tolerated staying up in chair after therapy.  Patient will benefit from continued physical therapy in hospital and recommended venue below to increase strength, balance, endurance for safe ADLs and gait.    Follow Up Recommendations  SNF     Equipment Recommendations  None recommended by PT    Recommendations for Other Services       Precautions / Restrictions Precautions Precautions: Fall Restrictions Weight Bearing Restrictions: No    Mobility  Bed Mobility               General bed mobility  comments: Patient presents seated in chair (assisted by nursing staff)  Transfers Overall transfer level: Needs assistance Equipment used: Rolling walker (2 wheeled) Transfers: Sit to/from [facility] Sit to Stand: Min assist Stand pivot transfers: Min assist       General transfer comment: labored movement, verbal cues for hand placement/body mechanics with good return demonstrated  Ambulation/Gait Ambulation/Gait assistance: Min assist Gait Distance (Feet): 15 Feet Assistive device: Rolling walker (2 wheeled) Gait Pattern/deviations: Decreased step length - right;Decreased step length - left;Decreased stride length Gait velocity: slow   General Gait Details: increased endurance/distance for taking slow labored steps in room followed with w/c for safety, limited secondary to c/o fatigue   Stairs             Wheelchair Mobility    Modified Rankin (Stroke Patients Only)       Balance Overall balance assessment: Needs assistance Sitting-balance support: Feet supported;No upper extremity supported Sitting balance-Leahy Scale: Good Sitting balance - Comments: seated in chair   Standing balance support: During functional activity;Bilateral upper extremity supported Standing balance-Leahy Scale: Fair Standing balance comment: using RW                            Cognition Arousal/Alertness: Awake/alert Behavior During Therapy: WFL for tasks assessed/performed Overall Cognitive Status: Within Functional Limits for tasks assessed  Exercises General Exercises - Lower Extremity Long Arc Quad: Seated;AROM;Strengthening;Both;10 reps Hip Flexion/Marching: Seated;AROM;Strengthening;Both;10 reps Toe Raises: Seated;AROM;Strengthening;Both;10 reps Heel Raises: Seated;AROM;Strengthening;Both;10 reps    General Comments        Pertinent Vitals/Pain Pain Assessment: Faces Pain Score: 2   Pain Location: left ankle, right hip when weightbearing Pain Descriptors / Indicators: Aching;Sore    Home Living                      Prior Function            PT Goals (current goals can now be found in the care plan section) Acute Rehab PT Goals Patient Stated Goal: return home after rehab PT Goal Formulation: With patient Time For Goal Achievement: 03/14/19 Potential to Achieve Goals: Good Progress towards PT goals: Progressing toward goals    Frequency    Min 3X/week      PT Plan Current plan remains appropriate    Co-evaluation              AM-PAC PT "6 Clicks" Mobility   Outcome Measure  Help needed turning from your back to your side while in a flat bed without using bedrails?: None Help needed moving from lying on your back to sitting on the side of a flat bed without using bedrails?: A Little Help needed moving to and from a bed to a chair (including a wheelchair)?: A Little Help needed standing up from a chair using your arms (e.g., wheelchair or bedside chair)?: A Little Help needed to walk in hospital room?: A Lot Help needed climbing 3-5 steps with a railing? : A Lot 6 Click Score: 17    End of Session   Activity Tolerance: Patient tolerated treatment well;Patient limited by fatigue Patient left: in chair;with call bell/phone within reach Nurse Communication: Mobility status PT Visit Diagnosis: Unsteadiness on feet (R26.81);Other abnormalities of gait and mobility (R26.89);Muscle weakness (generalized) (M62.81)     Time: 3646-8032 PT Time Calculation (min) (ACUTE ONLY): 29 min  Charges:  $Therapeutic Exercise: 8-22 mins $Therapeutic Activity: 8-22 mins                     4:20 PM, 03/04/19 Danny [name], MPT Physical Therapist with [facility] [phone] office [phone] mobile phone

## 2019-03-04 NOTE — Progress Notes (Signed)
PROGRESS NOTE  Danny [name] IYM:[id number] DOB: 1955-11-16 DOA: 02/23/2019 PCP: Celene Squibb, MD  Brief Admission History:  63 year old male with a history of hypertension, diabetes mellitus type 2, CKD stage III, psoriasis, venous stasis dermatitis, lymphedema presenting with 2-week history of dyspnea on exertion.  The patient states that he has had worsening lower extremity edema as well as increasing abdominal girth during this period of time.  He also describes some orthopnea type symptoms but denies any chest discomfort, fevers, chills, coughing, hemoptysis, nausea, vomiting, diarrhea, abdominal pain.  He endorses a 20 pound weight gain over the past month.  The patient states that he is supposed to be taking furosemide 40 mg daily, but he only takes 1 tablet (20 mg) daily.  The patient states that he quit smoking in 1988 after approximately 20-pack-year history.  He endorses dietary indiscretions at home.  In the emergency department, the patient was afebrile hemodynamically stable with oxygen saturation 95% on room air.  Assessment/Plan: Acute Diastolic CHF/Anasarca/Fluid Overload -Presented with progression of underlying CKD -Treated with furosemide IV 40 mg and transitioned to oral torsemide 02/27/2019 per nephrology. -Echo--EF >65%, no WMA -Pt has diuresed nearly 13 L since admission -Daily weights: Trending down with diuresis  Acute on chronic renal failure--CKD 3 -pt has progression of underlying kidney disease -Previous baseline creatinine of 1.8-1.9, new baseline seems to be around 3.5 -creatinine has been stable with oral torsemide diuresis -Renal ultrasound--medical renal disease, small amount bilateral perinephric stranding -24 hour urine consistent with nephrotic syndrome picture - see nephrology recommendations  Acute respiratory failure with hypoxia -stable on 3L [place] supplemental oxygen -due to fluid overload in setting of OSA/OHS -wean oxygen as tolerated for  saturation >92%  Abdominal Pain - Resolved now.  Pt first complained of abdominal pain to me today 9/4 and having LLQ pain. Sent for CT abdomen STAT: findings concerning for bladder outlet obstruction and mild bilateral ureterohydronephrosis.  Ordered bladder scan and foley placement.  Pt had massive diuresis and feeling better.   Pt having regular bowel movements now.  Pain radiating into back likely from bladder has resolved,  urinalysis suggestive of cystitis.  Repeat renal US shows resolution of hydronephrosis after decompression from foley placement.  Likely will need to keep foley until urology can see him in the office.   Essential hypertension -Continue metoprolol succinate and amlodipine -Discontinued lisinopril in the setting of worsening renal function  Diabetes mellitus type 2, uncontrolled with hyperglycemia -Hemoglobin A1c--7.1 -Holding glipizide -Continue reduced dose 70/30 insulin -NovoLog sliding scale  CBG (last 3)  Recent Labs    03/03/19 2053 03/04/19 0251 03/04/19 0731  GLUCAP 175* 139* 119*    Anemia of chronic disease/CKD -FOBT negative -Iron saturation 12%, ferritin 158 -Ferrlecit x1 -Serum H68, folic acid WNL  Chronic constipation - Pt having large BMs.  Laxatives ordered as needed.     Bladder Outlet Obstruction / Acute Urinary Retention  Foley placed on 9/4 and 1775 mL cloudy, foul smelling urine returned.  Urine culture ordered and IV levaquin ordered.    Klebsiella UTI/Cystitis - urine culture sensitive to cipro and cefazolin.  Treated with renally dosed IV levofloxacin for 3 full days of coverage and now is on cefazolin for cellulitis.   Nonpurulent Cellulitis BLEs - Elevate legs, IV cefazolin ordered (renally dose per nephrology).  Follow for clinical improvement.      Plantar Ulceration left foot - Continue local wound care.  Check  ABI study of LLE.    Bilateral Hydronephrosis - secondary to bladder outlet obstruction - repeated US renal  with no findings of hydronephrosis.   Hyperlipidemia -Continue statin -Lipid panel LDL 46, HDL 26, TC 95.   Right ankle wound -Wound care consult appreciated -See pictures in medical record  Chronic Venous stasis dermatitis -He has developed superficial infection and cellulitis of BLEs and started on cefazolin IV.  -Monitor clinically -Patient had difficulty tolerating [place] boots in the past  Psoriasis -[facility]s [facility] at [facility] -last dose Siliq 02/20/19  Disposition Plan:  He is agreeable to SNF placement which is being arranged Family Communication:  updated sister telephone per pt request 03/03/19  Consultants:  nephrology  DVT Prophylaxis:  [place] Lovenox  Procedures: As Listed in Progress Note Above  Antibiotics: None  Subjective: Patient reports constipation is better and he has had BM today.  Legs still red and tender.  No SOB.    Objective: Vitals:   03/03/19 0650 03/03/19 2052 03/04/19 0500 03/04/19 0617  BP: (!) 143/68 (!) 139/58  (!) 124/57  Pulse: 72 73  65  Resp: 20 18  20   Temp: 98.6 F (37 C) 98.3 F (36.8 C)  98 F (36.7 C)  TempSrc: Oral Oral  Oral  SpO2: 99% 97%  97%  Weight: (!) 139.4 kg  (!) 136.6 kg   Height:        Intake/Output Summary (Last 24 hours) at 03/04/2019 1210 Last data filed at 03/04/2019 0900 Gross per 24 hour  Intake 2060 ml  Output 3475 ml  Net -1415 ml   Weight change: -2.8 kg Exam:   General:  Pt is alert, follows commands appropriately, not in acute distress  HEENT: No icterus, No thrush, No neck mass, [place]/AT  Cardiovascular: normal S1/S2, no rubs, no gallops  Respiratory: BBS, no wheeze, no crackles, no rhonchi.  GU: foley to gravity in place. Amber urine.    Abdomen: obese, Soft/+BS, LLQ and suprapubic tenderness, mildly distended, no guarding  Extremities: 1+ pitting BLE edema, chronic venous stasis dermatitis and redness and warmth concerning for cellulitis BLEs. Multiple open and healed / scabbed  over sores on BLEs.       Left foot plantar ulceration      Data Reviewed: I have personally reviewed following labs and imaging studies Basic Metabolic Panel: Recent Labs  Lab 02/28/19 0420 03/01/19 0613 03/02/19 0648 03/03/19 0445 03/04/19 0454  NA 134* 134* 136 137 138  K 4.2 3.9 3.8 3.7 3.7  CL 101 101 102 104 105  CO2 24 25 26 25 25   GLUCOSE 181* 123* 151* 136* 122*  BUN 54* 57* 61* 62* 60*  CREATININE 3.43* 3.43* 3.41* 3.36* 3.35*  CALCIUM 8.4* 8.1* 8.2* 8.2* 8.2*  MG  --   --   --   --  2.0  PHOS 4.3 4.0 3.3 3.4 3.8   Liver Function Tests: Recent Labs  Lab 02/28/19 0420 03/01/19 0613 03/02/19 0648 03/03/19 0445 03/04/19 0454  ALBUMIN 2.9* 2.6* 2.5* 2.4* 2.3*   No results for input(s): LIPASE, AMYLASE in the last 168 hours. No results for input(s): AMMONIA in the last 168 hours. Coagulation Profile: No results for input(s): INR, PROTIME in the last 168 hours. CBC: Recent Labs  Lab 02/26/19 0418 02/27/19 0458 03/04/19 0454  WBC 9.3 12.6* 12.8*  NEUTROABS  --   --  10.5*  HGB 7.6* 8.0* 8.2*  HCT 24.7* 25.7* 25.4*  MCV 88.2 85.4 83.8  PLT 193 204 221  Cardiac Enzymes: No results for input(s): CKTOTAL, CKMB, CKMBINDEX, TROPONINI in the last 168 hours. BNP: Invalid input(s): POCBNP CBG: Recent Labs  Lab 03/03/19 1114 03/03/19 1616 03/03/19 2053 03/04/19 0251 03/04/19 0731  GLUCAP 194* 155* 175* 139* 119*   HbA1C: No results for input(s): HGBA1C in the last 72 hours. Urine analysis:    Component Value Date/Time   COLORURINE YELLOW 02/28/2019 1431   APPEARANCEUR CLOUDY (A) 02/28/2019 1431   LABSPEC 1.010 02/28/2019 1431   PHURINE 7.0 02/28/2019 1431   GLUCOSEU NEGATIVE 02/28/2019 1431   HGBUR LARGE (A) 02/28/2019 1431   BILIRUBINUR NEGATIVE 02/28/2019 1431   KETONESUR NEGATIVE 02/28/2019 1431   PROTEINUR 100 (A) 02/28/2019 1431   NITRITE NEGATIVE 02/28/2019 1431   LEUKOCYTESUR LARGE (A) 02/28/2019 1431    Recent Results (from  the past 240 hour(s))  SARS Coronavirus 2 [facility] order, Performed in [facility] hospital lab) Nasopharyngeal Nasopharyngeal Swab     Status: None   Collection Time: 02/23/19  4:01 PM   Specimen: Nasopharyngeal Swab  Result Value Ref Range Status   SARS Coronavirus 2 NEGATIVE NEGATIVE Final    Comment: (NOTE) If result is NEGATIVE SARS-CoV-2 target nucleic acids are NOT DETECTED. The SARS-CoV-2 RNA is generally detectable in upper and lower  respiratory specimens during the acute phase of infection. The lowest  concentration of SARS-CoV-2 viral copies this assay can detect is 250  copies / mL. A negative result does not preclude SARS-CoV-2 infection  and should not be used as the sole basis for treatment or other  patient management decisions.  A negative result may occur with  improper specimen collection / handling, submission of specimen other  than nasopharyngeal swab, presence of viral mutation(s) within the  areas targeted by this assay, and inadequate number of viral copies  (<250 copies / mL). A negative result must be combined with clinical  observations, patient history, and epidemiological information. If result is POSITIVE SARS-CoV-2 target nucleic acids are DETECTED. The SARS-CoV-2 RNA is generally detectable in upper and lower  respiratory specimens dur ing the acute phase of infection.  Positive  results are indicative of active infection with SARS-CoV-2.  Clinical  correlation with patient history and other diagnostic information is  necessary to determine patient infection status.  Positive results do  not rule out bacterial infection or co-infection with other viruses. If result is PRESUMPTIVE POSTIVE SARS-CoV-2 nucleic acids MAY BE PRESENT.   A presumptive positive result was obtained on the submitted specimen  and confirmed on repeat testing.  While 2019 novel coronavirus  (SARS-CoV-2) nucleic acids may be present in the submitted sample  additional  confirmatory testing may be necessary for epidemiological  and / or clinical management purposes  to differentiate between  SARS-CoV-2 and other Sarbecovirus currently known to infect humans.  If clinically indicated additional testing with an alternate test  methodology [phone]) is advised. The SARS-CoV-2 RNA is generally  detectable in upper and lower respiratory sp ecimens during the acute  phase of infection. The expected result is Negative. Fact Sheet for Patients:  StrictlyIdeas.no Fact Sheet for Healthcare Providers: BankingDealers.co.za This test is not yet approved or cleared by the [place] FDA and has been authorized for detection and/or diagnosis of SARS-CoV-2 by FDA under an Emergency Use Authorization (EUA).  This EUA will remain in effect (meaning this test can be used) for the duration of the COVID-19 declaration under Section 564(b)(1) of the Act, 21 U.S.C. section 360bbb-3(b)(1), unless the authorization is terminated or  revoked sooner. Performed at [facility], [address] [place], [place] [address]   Culture, Urine     Status: Abnormal   Collection Time: 02/28/19  2:31 PM   Specimen: Urine, Clean Catch  Result Value Ref Range Status   Specimen Description   Final    URINE, CLEAN CATCH Performed at [facility], [address], [place], [place] [address]    Special Requests   Final    NONE Performed at [facility], [address], [place], [place] [address]    Culture >=100,000 COLONIES/mL KLEBSIELLA PNEUMONIAE (A)  Final   Report Status 03/03/2019 FINAL  Final   Organism ID, Bacteria KLEBSIELLA PNEUMONIAE (A)  Final      Susceptibility   Klebsiella pneumoniae - MIC*    AMPICILLIN >=32 RESISTANT Resistant     CEFAZOLIN <=4 SENSITIVE Sensitive     CEFTRIAXONE <=1 SENSITIVE Sensitive     CIPROFLOXACIN <=0.25 SENSITIVE Sensitive     GENTAMICIN <=1 SENSITIVE Sensitive     IMIPENEM <=0.25 SENSITIVE  Sensitive     NITROFURANTOIN 128 RESISTANT Resistant     TRIMETH/SULFA <=20 SENSITIVE Sensitive     AMPICILLIN/SULBACTAM 4 SENSITIVE Sensitive     PIP/TAZO <=4 SENSITIVE Sensitive     Extended ESBL NEGATIVE Sensitive     * >=100,000 COLONIES/mL KLEBSIELLA PNEUMONIAE     Scheduled Meds:  allopurinol  300 mg Oral Daily   amLODipine  10 mg Oral QHS   enoxaparin (LOVENOX) injection  40 mg Subcutaneous J62G   folic acid  1 mg Oral Daily   guaiFENesin  600 mg Oral BID   insulin aspart  0-9 Units Subcutaneous TID WC   insulin aspart protamine- aspart  25 Units Subcutaneous BID WC   metoprolol succinate  100 mg Oral QPM   tamsulosin  0.4 mg Oral QPC supper   torsemide  20 mg Oral Daily   Continuous Infusions:  sodium chloride Stopped (03/02/19 1532)    ceFAZolin (ANCEF) IV      Procedures/Studies: Ct Abdomen Pelvis Wo Contrast  Result Date: 02/28/2019 CLINICAL DATA:  History of diverticulitis.  Abdominal distension. EXAM: CT ABDOMEN AND PELVIS WITHOUT CONTRAST TECHNIQUE: Multidetector CT imaging of the abdomen and pelvis was performed following the standard protocol without IV contrast. COMPARISON:  None. FINDINGS: Lower chest: Small bilateral pleural effusions are noted with minimal adjacent subsegmental atelectasis. Hepatobiliary: No focal liver abnormality is seen. Status post cholecystectomy. No biliary dilatation. Pancreas: Unremarkable. No pancreatic ductal dilatation or surrounding inflammatory changes. Spleen: Normal in size without focal abnormality. Adrenals/Urinary Tract: Right adrenal gland appears normal. 3.5 cm left adrenal lesion is noted with average Hounsfield measurement of under 10 most consistent with benign adenoma. Small nonobstructive right renal calculus is noted. Exophytic right renal cyst is noted. Mild bilateral hydroureteronephrosis is noted without obstructing calculus. Moderate urinary bladder distention is noted. This is concerning for possible bladder  outlet obstruction. There is noted some gas within the anterior portion of the urinary bladder wall. This most likely represents benign pneumatosis but possible cystitis cannot be excluded. Stomach/Bowel: Stomach is within normal limits. Appendix appears normal. No evidence of bowel wall thickening, distention, or inflammatory changes. Vascular/Lymphatic: Aortic atherosclerosis. No enlarged abdominal or pelvic lymph nodes. Reproductive: Prostate is unremarkable. Other: Moderate size fat containing left inguinal hernia is noted. No ascites is noted. Musculoskeletal: No acute or significant osseous findings. IMPRESSION: Mild bilateral hydroureteronephrosis is noted without obstructing ureteral calculus. Moderate urinary bladder distention is noted and this is concerning for possible  bladder outlet obstruction. Small amount emphysematous changes noted in the anterior portion of the urinary bladder wall which may represent benign pneumatosis, but possible emphysematous cystitis cannot be excluded. Clinical correlation is recommended. Nonobstructive right renal calculus. 3.5 cm left adrenal adenoma. Small bilateral pleural effusions with adjacent subsegmental atelectasis. Moderate size fat containing left inguinal hernia. Aortic Atherosclerosis (ICD10-I70.0). Electronically Signed   By: Marijo Conception M.D.   On: 02/28/2019 13:30   Dg Chest 2 View  Result Date: 02/23/2019 CLINICAL DATA:  Shortness of breath with exertion for several weeks. EXAM: CHEST - 2 VIEW COMPARISON:  None. FINDINGS: Thin accounting for the AP upright projection there is mild to moderate enlargement of the cardiopericardial silhouette. Blunted posterior costophrenic angles favoring small bilateral pleural effusions with passive atelectasis. No edema is observed. Mild thoracic spondylosis. IMPRESSION: 1. Mild to moderate enlargement of the cardiopericardial silhouette with suspected small bilateral pleural effusions but no current edema.  Electronically Signed   By: Van Clines M.D.   On: 02/23/2019 15:00   US Renal  Result Date: 03/01/2019 CLINICAL DATA:  Follow up hydronephrosis seen on previous day CT. EXAM: RENAL / URINARY TRACT ULTRASOUND COMPLETE COMPARISON:  CT abdomen pelvis 02/28/2019 FINDINGS: Right Kidney: Renal measurements: 10.7 x 5.1 x 6.8 cm = volume: 194 mL . Echogenicity within normal limits. Right renal cyst measuring 2.2 cm as seen on prior CT. No hydronephrosis visualized. Left Kidney: Renal measurements: 11.5 x 5.9 x 5.2 cm = volume: 186 mL. Echogenicity within normal limits. No mass or hydronephrosis visualized. Bladder: The bladder is decompressed. IMPRESSION: No sonographic evidence of hydronephrosis in the bilateral kidneys. Electronically Signed   By: Audie Pinto M.D.   On: 03/01/2019 12:45   US Renal  Result Date: 02/24/2019 CLINICAL DATA:  Acute on chronic renal failure EXAM: RENAL / URINARY TRACT ULTRASOUND COMPLETE COMPARISON:  August 30, 2018 FINDINGS: Right Kidney: Renal measurements: 12.2 x 6.3 x 6.5 cm = volume: 262 mL. Again noted is a anechoic mass which is partially exophytic in the midpole of the right kidney measuring 2.2 x 2.4 by 2.4 cm. There is slightly increased cortical echogenicity and thinning. There is perinephric stranding seen around the right kidney. Left Kidney: Renal measurements: 14.0 x 6.3 x 5.8 cm = volume: 269 mL. There is slightly increased cortical echogenicity and thinning. There is a small amount of perinephric stranding/fluid seen. Bladder: Bladder is partially distended. IMPRESSION: Findings suggestive of chronic medical renal disease. Partially exophytic mid right kidney simple cyst. Small amount of bilateral perinephric stranding and left perinephric fluid. Electronically Signed   By: Prudencio Pair M.D.   On: 02/24/2019 09:36   US Arterial Abi (screening Lower Extremity)  Result Date: 03/04/2019 CLINICAL DATA:  63 year old male with a history of vascular risk factors  EXAM: NONINVASIVE PHYSIOLOGIC VASCULAR STUDY OF BILATERAL LOWER EXTREMITIES TECHNIQUE: Evaluation of both lower extremities was performed at rest, including calculation of ankle-brachial indices, multiple segmental pressure evaluation, segmental Doppler and segmental pulse volume recording. COMPARISON:  None. FINDINGS: Right ABI:  1.45 Left ABI:  1.43 Right Lower Extremity:  PVR maintained at the ankle Left Lower Extremity:  PVR maintained at the ankle IMPRESSION: Resting ABI of the bilateral lower extremities borderline elevated, with the waveforms indicating no significant arterial occlusive disease. Signed, Dulcy Fanny. Dellia Nims, RPVI [facility] Electronically Signed   By: Corrie Mckusick D.O.   On: 03/04/2019 07:17   Dg Chest Port 1 View  Result Date: 02/27/2019  CLINICAL DATA:  Leukocytosis EXAM: PORTABLE CHEST 1 VIEW COMPARISON:  02/23/2019 FINDINGS: Cardiomegaly with slightly worsened central vascular congestion and pleural effusions. No interval consolidation or pneumothorax. IMPRESSION: Enlarged cardiomediastinal silhouette with slightly worsened vascular congestion and small bilateral effusion Electronically Signed   By: Donavan Foil M.D.   On: 02/27/2019 17:29   Ct Maxillofacial Wo Contrast  Result Date: 02/04/2019 CLINICAL DATA:  63 year old male with chronic sinusitis. EXAM: CT MAXILLOFACIAL WITHOUT CONTRAST TECHNIQUE: Multidetector CT images of the paranasal sinuses were obtained using the standard protocol without intravenous contrast. COMPARISON:  None. FINDINGS: Paranasal sinuses: Frontal: Mildly hyperplastic. Trace mucosal thickening, otherwise normally aerated. Patent frontal sinus drainage pathways. Ethmoid: Trace anterior ethmoid mucosal thickening, otherwise normally aerated. Maxillary: Normally aerated. Sphenoid: Mildly hyperplastic. Trace mucosal thickening on the right, otherwise normally aerated. Patent sphenoethmoidal recesses.  Right ostiomeatal unit: Patent (coronal image 22). Left ostiomeatal unit: Patent (same image). Nasal passages: Mostly rightward nasal septal deviation. Intact septum. Symmetric nasal cavity mucosal thickening (coronal image 28). No retained secretions. Pneumatized olfactory recesses. Anatomy: Mildly hyperplastic sphenoid sinuses with marginal left anterior clinoid process pneumatization. Anterior ethmoidal artery position suspected on coronal images 26 and 27 with some pneumatization superior to both notches. Keros type 2 olfactory fossa. Other: Calcified atherosclerosis at the skull base. Negative for age visible noncontrast brain parenchyma. Negative orbits soft tissues. Visualized scalp soft tissues are within normal limits. Negative visible noncontrast deep soft tissue spaces of the face. Tympanic cavities and mastoids are clear. Maxillary dentition might be absent. No acute osseous abnormality identified. IMPRESSION: 1. Trace mucosal thickening but otherwise normally aerated paranasal sinuses and patent sinus drainage pathways. 2. Mildly hyperplastic frontal and sphenoid sinuses. 3. Symmetric nasal cavity mucosal thickening raising the possibility of rhinitis. Mild rightward septal deviation. Electronically Signed   By: Genevie Ann M.D.   On: 02/04/2019 00:37    Marvell Tamer Wynetta Emery, MD  [facility] How to contact the [facility] Attending or Consulting provider [place] or covering provider during after hours [place], for this patient?  1. Check the care team in [facility] and look for a) attending/consulting [facility] provider listed and b) the [facility] team listed 2. Log into [email or web address] and use [place]'s universal password to access. If you do not have the password, please contact the hospital operator. 3. Locate the [facility] provider you are looking for under [facility] and page to a number that you can be directly reached. 4. If you still have difficulty reaching the provider, please page the [facility] (Director on Call)  for the Hospitalists listed on amion for assistance.   If 7PM-7AM, please contact night-coverage [email or web address] Password [email or web address] 03/04/2019, 12:10 PM   LOS: 9 days

## 2019-03-04 NOTE — Progress Notes (Signed)
[name] Progress Note    Assessment/ Plan:   1. AKI/CKD stage 4- in setting of decompensated CHF, NSAID use with concomitant ACE inhibition. His lisinopril was discontinued and he has diuresed well.  Renal US consistent with chronic medical renal disease.  Renal has discussed the ways to delay the progression of ckd with optimizing HTN and DM management.   - ACE/ARB currently on hold due to AKI -> may not be able to restart bec of degree of renal dysfunction - No indication for dialysis, however he will need close follow up and education as he has had progressive CKD over the past 3 years. - Renal function stable on po torsemide -> continue 20mg  daily of torsemide (on discharge will need to discontinue furosemide as an outpatient) - Noted foley - continue foley for now - Will need close follow up after discharge and will arrange to be seen in the next 2-3 weeks with [facility]. - In light of renal function would renally dose cefazolin - consider half of normal suggested dose for the indication every 12 hours or per pharmacy recommendation.  Discussed with primary team   2. Acute diastolic CHF/anasarca- Not sure if this is related to right sided CHF, progressive CKD, or nephrotic syndrome. 24 hour urine protein with 3 grams and low albumin consistent with nephrotic syndrome likely due to Diabetic nephropathy but has never had renal biopsy. Cont to follow. May have component of secondary FSGS as well.  SPEP/UPEP negative  - Continue torsemide 20 mg daily and follow response.  3. Anemia of CKD- as well as low iron stores.  Note s/p IV iron on 9/1 1. Feraheme x 1 dose today, 9/8 2. Start ESA today, 9/8   3. Continue to follow H/H while he remains an inpatient 4. Will need outpatient ESA after discharge.   4. HTN- stableoff of lisinopril.  5. DM - per primary svc 6. Obesity- have stressed importance of diet/weight loss 7. HLD- on statin 8. Chronic stasis dermatitis- cont with  diuretics and agree with UNNA boots if tolerates 9. Disposition- per primary team; awaiting placement.  Monserrate arrange for follow up at our office after discharge in the next 2-3 weeks.  Will also need  f/u with his PCP.   Subjective:   Had 3.5 liters UOP over 9/7.  Has a foley which was placed this admission per patient - he was not told of the duration of the need.  He confirms no history of cancer.  Patient states that he is awaiting placement for rehab; per team going to SNF.  Team is keeping him to optimize cellulitis treatment - on cefazolin.   Review of systems Denies shortness of breath or chest pain Denies nausea or vomiting  Urinating via foley    Objective:   BP (!) 124/57 (BP Location: Left Arm)   Pulse 65   Temp 98 F (36.7 C) (Oral)   Resp 20   Ht 6' (1.829 m)   Wt (!) 136.6 kg   SpO2 97%   BMI 40.84 kg/m   Intake/Output Summary (Last 24 hours) at 03/04/2019 0920 Last data filed at 03/04/2019 0600 Gross per 24 hour  Intake 1820 ml  Output 3475 ml  Net -1655 ml   Weight change: -2.8 kg  Physical Exam: General adult male in bed in no acute distress HEENT normocephalic atraumatic extraocular movements intact sclera anicteric Neck supple trachea midline Lungs clear to auscultation bilaterally normal work of breathing at rest; cough  Heart  regular rate and rhythm no rubs Abdomen soft nontender distended/obese habitus  Extremities 1-2+ edema; chronic venous stasis and some erythema  Psych normal mood and affect Neuro alert and oriented x 3 GU foley in place   Imaging: US Arterial Abi (screening Lower Extremity)  Result Date: 03/04/2019 CLINICAL DATA:  63 year old male with a history of vascular risk factors EXAM: NONINVASIVE PHYSIOLOGIC VASCULAR STUDY OF BILATERAL LOWER EXTREMITIES TECHNIQUE: Evaluation of both lower extremities was performed at rest, including calculation of ankle-brachial indices, multiple segmental pressure evaluation, segmental Doppler and  segmental pulse volume recording. COMPARISON:  None. FINDINGS: Right ABI:  1.45 Left ABI:  1.43 Right Lower Extremity:  PVR maintained at the ankle Left Lower Extremity:  PVR maintained at the ankle IMPRESSION: Resting ABI of the bilateral lower extremities borderline elevated, with the waveforms indicating no significant arterial occlusive disease. Signed, Dulcy Fanny. Dellia Nims, RPVI [facility] Electronically Signed   By: Corrie Mckusick D.O.   On: 03/04/2019 07:17    Labs: BMET Recent Labs  Lab 02/26/19 0418 02/27/19 0458 02/28/19 0420 03/01/19 5670 03/02/19 0648 03/03/19 0445 03/04/19 0454  NA 136 135 134* 134* 136 137 138  K 4.4 4.0 4.2 3.9 3.8 3.7 3.7  CL 106 103 101 101 102 104 105  CO2 24 24 24 25 26 25 25   GLUCOSE 118* 174* 181* 123* 151* 136* 122*  BUN 57* 57* 54* 57* 61* 62* 60*  CREATININE 3.69* 3.74* 3.43* 3.43* 3.41* 3.36* 3.35*  CALCIUM 7.9* 8.2* 8.4* 8.1* 8.2* 8.2* 8.2*  PHOS 5.3* 4.8* 4.3 4.0 3.3 3.4 3.8   CBC Recent Labs  Lab 02/26/19 0418 02/27/19 0458 03/04/19 0454  WBC 9.3 12.6* 12.8*  NEUTROABS  --   --  10.5*  HGB 7.6* 8.0* 8.2*  HCT 24.7* 25.7* 25.4*  MCV 88.2 85.4 83.8  PLT 193 204 221    Medications:    . allopurinol  300 mg Oral Daily  . amLODipine  10 mg Oral QHS  . enoxaparin (LOVENOX) injection  40 mg Subcutaneous Q24H  . folic acid  1 mg Oral Daily  . guaiFENesin  600 mg Oral BID  . insulin aspart  0-9 Units Subcutaneous TID WC  . insulin aspart protamine- aspart  25 Units Subcutaneous BID WC  . metoprolol succinate  100 mg Oral QPM  . tamsulosin  0.4 mg Oral QPC supper  . torsemide  20 mg Oral Daily     Claudia Desanctis 03/04/2019, 9:20 AM

## 2019-03-05 ENCOUNTER — Inpatient Hospital Stay
Admission: RE | Admit: 2019-03-05 | Discharge: 2019-03-22 | Disposition: A | Discharge status: Home / Self Care | Payer: Medicare HMO | Source: Ambulatory Visit | Attending: Internal Medicine | Admitting: Internal Medicine

## 2019-03-05 DIAGNOSIS — N3090 Cystitis, unspecified without hematuria: Secondary | ICD-10-CM | POA: Diagnosis not present

## 2019-03-05 DIAGNOSIS — M1A39X0 Chronic gout due to renal impairment, multiple sites, without tophus (tophi): Secondary | ICD-10-CM | POA: Diagnosis not present

## 2019-03-05 DIAGNOSIS — E1169 Type 2 diabetes mellitus with other specified complication: Secondary | ICD-10-CM | POA: Diagnosis not present

## 2019-03-05 DIAGNOSIS — I5031 Acute diastolic (congestive) heart failure: Secondary | ICD-10-CM | POA: Diagnosis not present

## 2019-03-05 DIAGNOSIS — I10 Essential (primary) hypertension: Secondary | ICD-10-CM | POA: Diagnosis not present

## 2019-03-05 DIAGNOSIS — N320 Bladder-neck obstruction: Secondary | ICD-10-CM | POA: Diagnosis not present

## 2019-03-05 DIAGNOSIS — Z794 Long term (current) use of insulin: Secondary | ICD-10-CM | POA: Diagnosis not present

## 2019-03-05 DIAGNOSIS — E11622 Type 2 diabetes mellitus with other skin ulcer: Secondary | ICD-10-CM | POA: Diagnosis not present

## 2019-03-05 DIAGNOSIS — L97319 Non-pressure chronic ulcer of right ankle with unspecified severity: Secondary | ICD-10-CM | POA: Diagnosis not present

## 2019-03-05 DIAGNOSIS — L409 Psoriasis, unspecified: Secondary | ICD-10-CM | POA: Diagnosis not present

## 2019-03-05 DIAGNOSIS — E1121 Type 2 diabetes mellitus with diabetic nephropathy: Secondary | ICD-10-CM | POA: Diagnosis not present

## 2019-03-05 DIAGNOSIS — N179 Acute kidney failure, unspecified: Secondary | ICD-10-CM | POA: Diagnosis not present

## 2019-03-05 DIAGNOSIS — N3080 Other cystitis without hematuria: Secondary | ICD-10-CM | POA: Diagnosis not present

## 2019-03-05 DIAGNOSIS — I129 Hypertensive chronic kidney disease with stage 1 through stage 4 chronic kidney disease, or unspecified chronic kidney disease: Secondary | ICD-10-CM | POA: Diagnosis not present

## 2019-03-05 DIAGNOSIS — N183 Chronic kidney disease, stage 3 (moderate): Secondary | ICD-10-CM | POA: Diagnosis not present

## 2019-03-05 DIAGNOSIS — E1165 Type 2 diabetes mellitus with hyperglycemia: Secondary | ICD-10-CM | POA: Diagnosis not present

## 2019-03-05 DIAGNOSIS — R601 Generalized edema: Secondary | ICD-10-CM | POA: Diagnosis not present

## 2019-03-05 DIAGNOSIS — R338 Other retention of urine: Secondary | ICD-10-CM | POA: Diagnosis not present

## 2019-03-05 DIAGNOSIS — I5032 Chronic diastolic (congestive) heart failure: Secondary | ICD-10-CM | POA: Diagnosis not present

## 2019-03-05 DIAGNOSIS — D509 Iron deficiency anemia, unspecified: Secondary | ICD-10-CM | POA: Diagnosis not present

## 2019-03-05 DIAGNOSIS — I130 Hypertensive heart and chronic kidney disease with heart failure and stage 1 through stage 4 chronic kidney disease, or unspecified chronic kidney disease: Secondary | ICD-10-CM | POA: Diagnosis not present

## 2019-03-05 DIAGNOSIS — I5033 Acute on chronic diastolic (congestive) heart failure: Secondary | ICD-10-CM | POA: Diagnosis not present

## 2019-03-05 DIAGNOSIS — E11621 Type 2 diabetes mellitus with foot ulcer: Secondary | ICD-10-CM | POA: Diagnosis not present

## 2019-03-05 DIAGNOSIS — E1122 Type 2 diabetes mellitus with diabetic chronic kidney disease: Secondary | ICD-10-CM | POA: Diagnosis not present

## 2019-03-05 DIAGNOSIS — J9601 Acute respiratory failure with hypoxia: Secondary | ICD-10-CM | POA: Diagnosis not present

## 2019-03-05 DIAGNOSIS — N1330 Unspecified hydronephrosis: Secondary | ICD-10-CM | POA: Diagnosis not present

## 2019-03-05 DIAGNOSIS — N184 Chronic kidney disease, stage 4 (severe): Secondary | ICD-10-CM | POA: Diagnosis not present

## 2019-03-05 DIAGNOSIS — I739 Peripheral vascular disease, unspecified: Secondary | ICD-10-CM | POA: Diagnosis not present

## 2019-03-05 DIAGNOSIS — E785 Hyperlipidemia, unspecified: Secondary | ICD-10-CM | POA: Diagnosis not present

## 2019-03-05 DIAGNOSIS — I890 Lymphedema, not elsewhere classified: Secondary | ICD-10-CM | POA: Diagnosis not present

## 2019-03-05 DIAGNOSIS — L97521 Non-pressure chronic ulcer of other part of left foot limited to breakdown of skin: Secondary | ICD-10-CM | POA: Diagnosis not present

## 2019-03-05 DIAGNOSIS — K219 Gastro-esophageal reflux disease without esophagitis: Secondary | ICD-10-CM | POA: Diagnosis not present

## 2019-03-05 DIAGNOSIS — B372 Candidiasis of skin and nail: Secondary | ICD-10-CM | POA: Diagnosis not present

## 2019-03-05 DIAGNOSIS — D6489 Other specified anemias: Secondary | ICD-10-CM | POA: Diagnosis not present

## 2019-03-05 DIAGNOSIS — E782 Mixed hyperlipidemia: Secondary | ICD-10-CM | POA: Diagnosis not present

## 2019-03-05 DIAGNOSIS — N2581 Secondary hyperparathyroidism of renal origin: Secondary | ICD-10-CM | POA: Diagnosis not present

## 2019-03-05 LAB — RENAL FUNCTION PANEL
Albumin: 2.5 g/dL — ABNORMAL LOW (ref 3.5–5.0)
Anion gap: 9 (ref 5–15)
BUN: 54 mg/dL — ABNORMAL HIGH (ref 8–23)
CO2: 24 mmol/L (ref 22–32)
Calcium: 8.4 mg/dL — ABNORMAL LOW (ref 8.9–10.3)
Chloride: 102 mmol/L (ref 98–111)
Creatinine, Ser: 3.14 mg/dL — ABNORMAL HIGH (ref 0.61–1.24)
GFR calc Af Amer: 23 mL/min — ABNORMAL LOW (ref >60)
GFR calc non Af Amer: 20 mL/min — ABNORMAL LOW (ref >60)
Glucose, Bld: 123 mg/dL — ABNORMAL HIGH (ref 70–99)
Phosphorus: 3.9 mg/dL (ref 2.5–4.6)
Potassium: 3.7 mmol/L (ref 3.5–5.1)
Sodium: 135 mmol/L (ref 135–145)

## 2019-03-05 LAB — CBC
HCT: 28.8 % — ABNORMAL LOW (ref 39.0–52.0)
Hemoglobin: 9 g/dL — ABNORMAL LOW (ref 13.0–17.0)
MCH: 26.3 pg (ref 26.0–34.0)
MCHC: 31.3 g/dL (ref 30.0–36.0)
MCV: 84.2 fL (ref 80.0–100.0)
Platelets: 256 10*3/uL (ref 150–400)
RBC: 3.42 MIL/uL — ABNORMAL LOW (ref 4.22–5.81)
RDW: 18.3 % — ABNORMAL HIGH (ref 11.5–15.5)
WBC: 10.2 10*3/uL (ref 4.0–10.5)
nRBC: 0 % (ref 0.0–0.2)

## 2019-03-05 LAB — GLUCOSE, CAPILLARY
Glucose-Capillary: 140 mg/dL — ABNORMAL HIGH (ref 70–99)
Glucose-Capillary: 147 mg/dL — ABNORMAL HIGH (ref 70–99)
Glucose-Capillary: 165 mg/dL — ABNORMAL HIGH (ref 70–99)
Glucose-Capillary: 170 mg/dL — ABNORMAL HIGH (ref 70–99)

## 2019-03-05 MED ORDER — INSULIN ASPART PROT & ASPART (70-30 MIX) 100 UNIT/ML ~~LOC~~ SUSP: 25.0000 [IU] | Freq: Two times a day (BID) | SUBCUTANEOUS | 11 refills | Status: DC

## 2019-03-05 MED ORDER — CEPHALEXIN 500 MG PO CAPS: 500.0000 mg | ORAL_CAPSULE | Freq: Three times a day (TID) | ORAL | 0 refills | Status: DC

## 2019-03-05 MED ORDER — FOLIC ACID 1 MG PO TABS: 1.0000 mg | ORAL_TABLET | Freq: Every day | ORAL | 0 refills | Status: DC

## 2019-03-05 MED ORDER — TORSEMIDE 20 MG PO TABS: 20.0000 mg | ORAL_TABLET | Freq: Every day | ORAL | 1 refills | Status: DC

## 2019-03-05 MED ORDER — TAMSULOSIN HCL 0.4 MG PO CAPS: 0.4000 mg | ORAL_CAPSULE | Freq: Every day | ORAL | 1 refills | Status: DC

## 2019-03-05 MED ORDER — TORSEMIDE 20 MG PO TABS
20.0000 mg | ORAL_TABLET | Freq: Once | ORAL | Status: AC
  Administered 2019-03-05: 20 mg via ORAL
  Filled 2019-03-05: qty 1

## 2019-03-05 MED ORDER — SENNOSIDES-DOCUSATE SODIUM 8.6-50 MG PO TABS: 2.0000 | ORAL_TABLET | Freq: Every evening | ORAL | Status: DC | PRN

## 2019-03-05 MED ORDER — CEPHALEXIN 500 MG PO CAPS
500.0000 mg | ORAL_CAPSULE | Freq: Three times a day (TID) | ORAL | Status: DC
  Administered 2019-03-05: 500 mg via ORAL
  Filled 2019-03-05: qty 1

## 2019-03-05 NOTE — Discharge Summary (Addendum)
Physician Discharge Summary  Edward Guthmiller YCX:[id number] DOB: November 29, 1955 DOA: 02/23/2019  PCP: Celene Squibb, MD  Admit date: 02/23/2019 Discharge date: 03/05/2019  Admitted From: Home Disposition:  SNF  Recommendations for Outpatient Follow-up:  1. Follow up with PCP in 1-2 weeks 2. Please obtain BMP/CBC in one week 3. Follow up urology--Dr. Zannie Cove 04/01/19 at 10 AM     Discharge Condition: Stable CODE STATUS:FULL Diet recommendation: Heart Healthy / Carb Modified   Brief/Interim Summary: 63 year old male with a history of hypertension, diabetes mellitus type 2, CKD stage III, psoriasis, venous stasis dermatitis, lymphedema presenting with 2-week history of dyspnea on exertion. The patient states that he has had worsening lower extremity edema as well as increasing abdominal girth during this period of time. He also describes some orthopnea type symptoms but denies any chest discomfort, fevers, chills, coughing, hemoptysis, nausea, vomiting, diarrhea, abdominal pain. He endorses a 20 pound weight gain over the past month. The patient states that he is supposed to be taking furosemide 40 mg daily, but he only takes 1 tablet (20 mg) daily. The patient states that he quit smoking in 1988 after approximately 20-pack-year history.  He endorses dietary indiscretions at home.  Discharge Diagnoses:  Acute Diastolic CHF/Anasarca/Fluid Overload -in part due to progression of underlying CKD -Continue furosemide IV 40 mg bid - transitioned to oral torsemide 02/27/2019 per nephrology. -Echo--EF >65%, no WMA -The patient remains clinically fluid overloaded -Daily weights  313>>>298.9 lbs at time of d/c -Personally reviewed chest x-ray--increased interstitial markings, vascular congestion -NEG 14.7 L during the admission  Acute on chronic renal failure--CKD 3 -pt has progression of underlying kidney disease -Previous baseline creatinine of 1.8-1.9 -Monitor with diuresis -Renal  ultrasound--medical renal disease, small amount bilateral perinephric stranding -24 hour urine --3 grams protein -serum creatinine 3.14 on day of dc -follow up nephrology in 2 weeks  Emphysematous cystitis/BOO -abdominal pain to me today 9/4 and having LLQ pain.  -Sent for CT abdomen STAT: findings concerning for bladder outlet obstruction and mild bilateral ureterohydronephrosis and small amount of air in anterior bladder.  -foley placed 02/28/19 -Pt had massive diuresis and feeling better.     -03/01/19 Repeat renal US shows resolution of hydronephrosis after decompression from foley placement. -urine culture = Klebsiella -case discussed with urology, Dr. Lonzo Cloud abx x 2 weeks, maintain foley at d/c, follow up office -d/c with cephalexin x 10 more days  Acute respiratory failure with hypoxia -stable on 3L>>>weaned to RA -due to fluid overload in setting of OSA/OHS -wean oxygen as tolerated for saturation >92%  Essential hypertension -Continue metoprolol succinate and amlodipine -Discontinue lisinopril in the setting of worsening renal function  Diabetes mellitus type 2, uncontrolled with hyperglycemia -Hemoglobin A1c--7.1 -Holding glipizide -Continue reduced dose 70/30 insulin--25 units bid -CBGs controlled -NovoLog sliding scale  Anemia of chronic disease/CKD -FOBT negative -Iron saturation 12%, ferritin 158 -Ferrlecit x1 and Feraheme x 1 -Serum S97--0263  -folic ZCHY--85.0  Hyperlipidemia -Continue statin -LDL--46  Right ankle wound -Wound care consult appreciated--Silver hydrofiber for exudate and recalcitrant wound bed. Change daily. Cover with dry dressing or foam  -See pictures in medical record  Venous stasis dermatitis -Hold off any antibiotics at this time -Monitor clinically -Patient had difficulty tolerating Unna boots in the past -essentially unchanged since 02/25/19  Psoriasis -follows dermatology at [facility] -last dose Siliq  02/20/19    Discharge Instructions   Allergies as of 03/05/2019      Reactions   Dust Mite Extract Itching, Other (See Comments)  Unknown reaction-potential shortness of breath   Prednisone Nausea And Vomiting   Rocephin [ceftriaxone] Nausea And Vomiting      Medication List    STOP taking these medications   furosemide 20 MG tablet Commonly known as: LASIX   glipiZIDE 10 MG tablet Commonly known as: GLUCOTROL   lisinopril 20 MG tablet Commonly known as: ZESTRIL   NovoLIN 70/30 ReliOn (70-30) 100 UNIT/ML injection Generic drug: insulin NPH-regular Human Replaced by: insulin aspart protamine- aspart (70-30) 100 UNIT/ML injection     TAKE these medications   allopurinol 300 MG tablet Commonly known as: ZYLOPRIM Take 1 tablet by mouth daily.   amLODipine 10 MG tablet Commonly known as: NORVASC Take 10 mg by mouth at bedtime.   Brodalumab 210 MG/1.5ML Sosy Inject 210 mg into the skin every 14 (fourteen) days.   cephALEXin 500 MG capsule Commonly known as: KEFLEX Take 1 capsule (500 mg total) by mouth every 8 (eight) hours. X 10 days   ECZEMA MOISTURIZING EX Apply 1 application topically daily as needed (applied to legs).   folic acid 1 MG tablet Commonly known as: FOLVITE Take 1 tablet (1 mg total) by mouth daily. Start taking on: March 06, 2019   insulin aspart protamine- aspart (70-30) 100 UNIT/ML injection Commonly known as: NOVOLOG MIX 70/30 Inject 0.25 mLs (25 Units total) into the skin 2 (two) times daily with a meal. Replaces: NovoLIN 70/30 ReliOn (70-30) 100 UNIT/ML injection   metoprolol succinate 100 MG 24 hr tablet Commonly known as: TOPROL-XL Take 100 mg by mouth every evening.   pravastatin 80 MG tablet Commonly known as: PRAVACHOL Take 80 mg by mouth every evening.   senna-docusate 8.6-50 MG tablet Commonly known as: Senokot-S Take 2 tablets by mouth at bedtime as needed for moderate constipation.   tamsulosin 0.4 MG Caps  capsule Commonly known as: FLOMAX Take 1 capsule (0.4 mg total) by mouth daily after supper.   torsemide 20 MG tablet Commonly known as: DEMADEX Take 1 tablet (20 mg total) by mouth daily. Start taking on: March 06, 2019       Contact information for follow-up providers    Franchot Gallo, MD On 04/01/2019.   Specialty: Urology Why: at 10:00 am Contact information: [address] [place] [address] [phone]            Contact information for after-discharge care    [facility] .   Service: Skilled Nursing Contact information: [address] [facility] [place] [address] [phone]                 Allergies  Allergen Reactions   Dust Mite Extract Itching and Other (See Comments)    Unknown reaction-potential shortness of breath   Prednisone Nausea And Vomiting   Rocephin [Ceftriaxone] Nausea And Vomiting    Consultations:  renal   Procedures/Studies: Ct Abdomen Pelvis Wo Contrast  Result Date: 02/28/2019 CLINICAL DATA:  History of diverticulitis.  Abdominal distension. EXAM: CT ABDOMEN AND PELVIS WITHOUT CONTRAST TECHNIQUE: Multidetector CT imaging of the abdomen and pelvis was performed following the standard protocol without IV contrast. COMPARISON:  None. FINDINGS: Lower chest: Small bilateral pleural effusions are noted with minimal adjacent subsegmental atelectasis. Hepatobiliary: No focal liver abnormality is seen. Status post cholecystectomy. No biliary dilatation. Pancreas: Unremarkable. No pancreatic ductal dilatation or surrounding inflammatory changes. Spleen: Normal in size without focal abnormality. Adrenals/Urinary Tract: Right adrenal gland appears normal. 3.5 cm left adrenal  lesion is noted with average Hounsfield measurement of under 10 most consistent with benign adenoma. Small nonobstructive right renal calculus is noted. Exophytic right renal cyst is noted. Mild  bilateral hydroureteronephrosis is noted without obstructing calculus. Moderate urinary bladder distention is noted. This is concerning for possible bladder outlet obstruction. There is noted some gas within the anterior portion of the urinary bladder wall. This most likely represents benign pneumatosis but possible cystitis cannot be excluded. Stomach/Bowel: Stomach is within normal limits. Appendix appears normal. No evidence of bowel wall thickening, distention, or inflammatory changes. Vascular/Lymphatic: Aortic atherosclerosis. No enlarged abdominal or pelvic lymph nodes. Reproductive: Prostate is unremarkable. Other: Moderate size fat containing left inguinal hernia is noted. No ascites is noted. Musculoskeletal: No acute or significant osseous findings. IMPRESSION: Mild bilateral hydroureteronephrosis is noted without obstructing ureteral calculus. Moderate urinary bladder distention is noted and this is concerning for possible bladder outlet obstruction. Small amount emphysematous changes noted in the anterior portion of the urinary bladder wall which may represent benign pneumatosis, but possible emphysematous cystitis cannot be excluded. Clinical correlation is recommended. Nonobstructive right renal calculus. 3.5 cm left adrenal adenoma. Small bilateral pleural effusions with adjacent subsegmental atelectasis. Moderate size fat containing left inguinal hernia. Aortic Atherosclerosis (ICD10-I70.0). Electronically Signed   By: Marijo Conception M.D.   On: 02/28/2019 13:30   Dg Chest 2 View  Result Date: 02/23/2019 CLINICAL DATA:  Shortness of breath with exertion for several weeks. EXAM: CHEST - 2 VIEW COMPARISON:  None. FINDINGS: Thin accounting for the AP upright projection there is mild to moderate enlargement of the cardiopericardial silhouette. Blunted posterior costophrenic angles favoring small bilateral pleural effusions with passive atelectasis. No edema is observed. Mild thoracic spondylosis.  IMPRESSION: 1. Mild to moderate enlargement of the cardiopericardial silhouette with suspected small bilateral pleural effusions but no current edema. Electronically Signed   By: Van Clines M.D.   On: 02/23/2019 15:00   US Renal  Result Date: 03/01/2019 CLINICAL DATA:  Follow up hydronephrosis seen on previous day CT. EXAM: RENAL / URINARY TRACT ULTRASOUND COMPLETE COMPARISON:  CT abdomen pelvis 02/28/2019 FINDINGS: Right Kidney: Renal measurements: 10.7 x 5.1 x 6.8 cm = volume: 194 mL . Echogenicity within normal limits. Right renal cyst measuring 2.2 cm as seen on prior CT. No hydronephrosis visualized. Left Kidney: Renal measurements: 11.5 x 5.9 x 5.2 cm = volume: 186 mL. Echogenicity within normal limits. No mass or hydronephrosis visualized. Bladder: The bladder is decompressed. IMPRESSION: No sonographic evidence of hydronephrosis in the bilateral kidneys. Electronically Signed   By: Audie Pinto M.D.   On: 03/01/2019 12:45   US Renal  Result Date: 02/24/2019 CLINICAL DATA:  Acute on chronic renal failure EXAM: RENAL / URINARY TRACT ULTRASOUND COMPLETE COMPARISON:  August 30, 2018 FINDINGS: Right Kidney: Renal measurements: 12.2 x 6.3 x 6.5 cm = volume: 262 mL. Again noted is a anechoic mass which is partially exophytic in the midpole of the right kidney measuring 2.2 x 2.4 by 2.4 cm. There is slightly increased cortical echogenicity and thinning. There is perinephric stranding seen around the right kidney. Left Kidney: Renal measurements: 14.0 x 6.3 x 5.8 cm = volume: 269 mL. There is slightly increased cortical echogenicity and thinning. There is a small amount of perinephric stranding/fluid seen. Bladder: Bladder is partially distended. IMPRESSION: Findings suggestive of chronic medical renal disease. Partially exophytic mid right kidney simple cyst. Small amount of bilateral perinephric stranding and left perinephric fluid. Electronically Signed   By: Prudencio Pair  M.D.   On: 02/24/2019  09:36   US Arterial Abi (screening Lower Extremity)  Result Date: 03/04/2019 CLINICAL DATA:  63 year old male with a history of vascular risk factors EXAM: NONINVASIVE PHYSIOLOGIC VASCULAR STUDY OF BILATERAL LOWER EXTREMITIES TECHNIQUE: Evaluation of both lower extremities was performed at rest, including calculation of ankle-brachial indices, multiple segmental pressure evaluation, segmental Doppler and segmental pulse volume recording. COMPARISON:  None. FINDINGS: Right ABI:  1.45 Left ABI:  1.43 Right Lower Extremity:  PVR maintained at the ankle Left Lower Extremity:  PVR maintained at the ankle IMPRESSION: Resting ABI of the bilateral lower extremities borderline elevated, with the waveforms indicating no significant arterial occlusive disease. Signed, Dulcy Fanny. Dellia Nims, RPVI [facility] Radiology Electronically Signed   By: Corrie Mckusick D.O.   On: 03/04/2019 07:17   Dg Chest Port 1 View  Result Date: 02/27/2019 CLINICAL DATA:  Leukocytosis EXAM: PORTABLE CHEST 1 VIEW COMPARISON:  02/23/2019 FINDINGS: Cardiomegaly with slightly worsened central vascular congestion and pleural effusions. No interval consolidation or pneumothorax. IMPRESSION: Enlarged cardiomediastinal silhouette with slightly worsened vascular congestion and small bilateral effusion Electronically Signed   By: Donavan Foil M.D.   On: 02/27/2019 17:29   Ct Maxillofacial Wo Contrast  Result Date: 02/04/2019 CLINICAL DATA:  63 year old male with chronic sinusitis. EXAM: CT MAXILLOFACIAL WITHOUT CONTRAST TECHNIQUE: Multidetector CT images of the paranasal sinuses were obtained using the standard protocol without intravenous contrast. COMPARISON:  None. FINDINGS: Paranasal sinuses: Frontal: Mildly hyperplastic. Trace mucosal thickening, otherwise normally aerated. Patent frontal sinus drainage pathways. Ethmoid: Trace anterior ethmoid mucosal thickening, otherwise normally aerated.  Maxillary: Normally aerated. Sphenoid: Mildly hyperplastic. Trace mucosal thickening on the right, otherwise normally aerated. Patent sphenoethmoidal recesses. Right ostiomeatal unit: Patent (coronal image 22). Left ostiomeatal unit: Patent (same image). Nasal passages: Mostly rightward nasal septal deviation. Intact septum. Symmetric nasal cavity mucosal thickening (coronal image 28). No retained secretions. Pneumatized olfactory recesses. Anatomy: Mildly hyperplastic sphenoid sinuses with marginal left anterior clinoid process pneumatization. Anterior ethmoidal artery position suspected on coronal images 26 and 27 with some pneumatization superior to both notches. Keros type 2 olfactory fossa. Other: Calcified atherosclerosis at the skull base. Negative for age visible noncontrast brain parenchyma. Negative orbits soft tissues. Visualized scalp soft tissues are within normal limits. Negative visible noncontrast deep soft tissue spaces of the face. Tympanic cavities and mastoids are clear. Maxillary dentition might be absent. No acute osseous abnormality identified. IMPRESSION: 1. Trace mucosal thickening but otherwise normally aerated paranasal sinuses and patent sinus drainage pathways. 2. Mildly hyperplastic frontal and sphenoid sinuses. 3. Symmetric nasal cavity mucosal thickening raising the possibility of rhinitis. Mild rightward septal deviation. Electronically Signed   By: Genevie Ann M.D.   On: 02/04/2019 00:37        Discharge Exam: Vitals:   03/05/19 0529 03/05/19 1354  BP: 134/72 132/64  Pulse: 73 72  Resp: 20 19  Temp: 99.4 F (37.4 C)   SpO2: 97% 100%   Vitals:   03/04/19 2118 03/04/19 2146 03/05/19 0529 03/05/19 1354  BP: (!) 112/37 (!) 149/61 134/72 132/64  Pulse: 68 67 73 72  Resp: 20  20 19   Temp: 98.4 F (36.9 C)  99.4 F (37.4 C)   TempSrc: Oral  Oral   SpO2: 99%  97% 100%  Weight:   135.6 kg   Height:        General: Pt is alert, awake, not in acute  distress Cardiovascular: RRR, S1/S2 +, no rubs, no  gallops Respiratory: CTA bilaterally, no wheezing, no rhonchi Abdominal: Soft, NT, ND, bowel sounds + Extremities: no edema, no cyanosis   The results of significant diagnostics from this hospitalization (including imaging, microbiology, ancillary and laboratory) are listed below for reference.    Significant Diagnostic Studies: Ct Abdomen Pelvis Wo Contrast  Result Date: 02/28/2019 CLINICAL DATA:  History of diverticulitis.  Abdominal distension. EXAM: CT ABDOMEN AND PELVIS WITHOUT CONTRAST TECHNIQUE: Multidetector CT imaging of the abdomen and pelvis was performed following the standard protocol without IV contrast. COMPARISON:  None. FINDINGS: Lower chest: Small bilateral pleural effusions are noted with minimal adjacent subsegmental atelectasis. Hepatobiliary: No focal liver abnormality is seen. Status post cholecystectomy. No biliary dilatation. Pancreas: Unremarkable. No pancreatic ductal dilatation or surrounding inflammatory changes. Spleen: Normal in size without focal abnormality. Adrenals/Urinary Tract: Right adrenal gland appears normal. 3.5 cm left adrenal lesion is noted with average Hounsfield measurement of under 10 most consistent with benign adenoma. Small nonobstructive right renal calculus is noted. Exophytic right renal cyst is noted. Mild bilateral hydroureteronephrosis is noted without obstructing calculus. Moderate urinary bladder distention is noted. This is concerning for possible bladder outlet obstruction. There is noted some gas within the anterior portion of the urinary bladder wall. This most likely represents benign pneumatosis but possible cystitis cannot be excluded. Stomach/Bowel: Stomach is within normal limits. Appendix appears normal. No evidence of bowel wall thickening, distention, or inflammatory changes. Vascular/Lymphatic: Aortic atherosclerosis. No enlarged abdominal or pelvic lymph nodes. Reproductive: Prostate  is unremarkable. Other: Moderate size fat containing left inguinal hernia is noted. No ascites is noted. Musculoskeletal: No acute or significant osseous findings. IMPRESSION: Mild bilateral hydroureteronephrosis is noted without obstructing ureteral calculus. Moderate urinary bladder distention is noted and this is concerning for possible bladder outlet obstruction. Small amount emphysematous changes noted in the anterior portion of the urinary bladder wall which may represent benign pneumatosis, but possible emphysematous cystitis cannot be excluded. Clinical correlation is recommended. Nonobstructive right renal calculus. 3.5 cm left adrenal adenoma. Small bilateral pleural effusions with adjacent subsegmental atelectasis. Moderate size fat containing left inguinal hernia. Aortic Atherosclerosis (ICD10-I70.0). Electronically Signed   By: Marijo Conception M.D.   On: 02/28/2019 13:30   Dg Chest 2 View  Result Date: 02/23/2019 CLINICAL DATA:  Shortness of breath with exertion for several weeks. EXAM: CHEST - 2 VIEW COMPARISON:  None. FINDINGS: Thin accounting for the AP upright projection there is mild to moderate enlargement of the cardiopericardial silhouette. Blunted posterior costophrenic angles favoring small bilateral pleural effusions with passive atelectasis. No edema is observed. Mild thoracic spondylosis. IMPRESSION: 1. Mild to moderate enlargement of the cardiopericardial silhouette with suspected small bilateral pleural effusions but no current edema. Electronically Signed   By: Van Clines M.D.   On: 02/23/2019 15:00   US Renal  Result Date: 03/01/2019 CLINICAL DATA:  Follow up hydronephrosis seen on previous day CT. EXAM: RENAL / URINARY TRACT ULTRASOUND COMPLETE COMPARISON:  CT abdomen pelvis 02/28/2019 FINDINGS: Right Kidney: Renal measurements: 10.7 x 5.1 x 6.8 cm = volume: 194 mL . Echogenicity within normal limits. Right renal cyst measuring 2.2 cm as seen on prior CT. No  hydronephrosis visualized. Left Kidney: Renal measurements: 11.5 x 5.9 x 5.2 cm = volume: 186 mL. Echogenicity within normal limits. No mass or hydronephrosis visualized. Bladder: The bladder is decompressed. IMPRESSION: No sonographic evidence of hydronephrosis in the bilateral kidneys. Electronically Signed   By: Audie Pinto M.D.   On: 03/01/2019 12:45   US Renal  Result Date: 02/24/2019 CLINICAL DATA:  Acute on chronic renal failure EXAM: RENAL / URINARY TRACT ULTRASOUND COMPLETE COMPARISON:  August 30, 2018 FINDINGS: Right Kidney: Renal measurements: 12.2 x 6.3 x 6.5 cm = volume: 262 mL. Again noted is a anechoic mass which is partially exophytic in the midpole of the right kidney measuring 2.2 x 2.4 by 2.4 cm. There is slightly increased cortical echogenicity and thinning. There is perinephric stranding seen around the right kidney. Left Kidney: Renal measurements: 14.0 x 6.3 x 5.8 cm = volume: 269 mL. There is slightly increased cortical echogenicity and thinning. There is a small amount of perinephric stranding/fluid seen. Bladder: Bladder is partially distended. IMPRESSION: Findings suggestive of chronic medical renal disease. Partially exophytic mid right kidney simple cyst. Small amount of bilateral perinephric stranding and left perinephric fluid. Electronically Signed   By: Prudencio Pair M.D.   On: 02/24/2019 09:36   US Arterial Abi (screening Lower Extremity)  Result Date: 03/04/2019 CLINICAL DATA:  63 year old male with a history of vascular risk factors EXAM: NONINVASIVE PHYSIOLOGIC VASCULAR STUDY OF BILATERAL LOWER EXTREMITIES TECHNIQUE: Evaluation of both lower extremities was performed at rest, including calculation of ankle-brachial indices, multiple segmental pressure evaluation, segmental Doppler and segmental pulse volume recording. COMPARISON:  None. FINDINGS: Right ABI:  1.45 Left ABI:  1.43 Right Lower Extremity:  PVR maintained at the ankle Left Lower Extremity:  PVR maintained at  the ankle IMPRESSION: Resting ABI of the bilateral lower extremities borderline elevated, with the waveforms indicating no significant arterial occlusive disease. Signed, Dulcy Fanny. Dellia Nims, RPVI [facility] Radiology Electronically Signed   By: Corrie Mckusick D.O.   On: 03/04/2019 07:17   Dg Chest Port 1 View  Result Date: 02/27/2019 CLINICAL DATA:  Leukocytosis EXAM: PORTABLE CHEST 1 VIEW COMPARISON:  02/23/2019 FINDINGS: Cardiomegaly with slightly worsened central vascular congestion and pleural effusions. No interval consolidation or pneumothorax. IMPRESSION: Enlarged cardiomediastinal silhouette with slightly worsened vascular congestion and small bilateral effusion Electronically Signed   By: Donavan Foil M.D.   On: 02/27/2019 17:29   Ct Maxillofacial Wo Contrast  Result Date: 02/04/2019 CLINICAL DATA:  63 year old male with chronic sinusitis. EXAM: CT MAXILLOFACIAL WITHOUT CONTRAST TECHNIQUE: Multidetector CT images of the paranasal sinuses were obtained using the standard protocol without intravenous contrast. COMPARISON:  None. FINDINGS: Paranasal sinuses: Frontal: Mildly hyperplastic. Trace mucosal thickening, otherwise normally aerated. Patent frontal sinus drainage pathways. Ethmoid: Trace anterior ethmoid mucosal thickening, otherwise normally aerated. Maxillary: Normally aerated. Sphenoid: Mildly hyperplastic. Trace mucosal thickening on the right, otherwise normally aerated. Patent sphenoethmoidal recesses. Right ostiomeatal unit: Patent (coronal image 22). Left ostiomeatal unit: Patent (same image). Nasal passages: Mostly rightward nasal septal deviation. Intact septum. Symmetric nasal cavity mucosal thickening (coronal image 28). No retained secretions. Pneumatized olfactory recesses. Anatomy: Mildly hyperplastic sphenoid sinuses with marginal left anterior clinoid process pneumatization. Anterior ethmoidal artery position suspected on coronal  images 26 and 27 with some pneumatization superior to both notches. Keros type 2 olfactory fossa. Other: Calcified atherosclerosis at the skull base. Negative for age visible noncontrast brain parenchyma. Negative orbits soft tissues. Visualized scalp soft tissues are within normal limits. Negative visible noncontrast deep soft tissue spaces of the face. Tympanic cavities and mastoids are clear. Maxillary dentition might be absent. No acute osseous abnormality identified. IMPRESSION: 1. Trace mucosal thickening but otherwise normally aerated paranasal sinuses and patent sinus drainage pathways. 2. Mildly hyperplastic frontal and sphenoid sinuses. 3. Symmetric nasal cavity mucosal thickening raising the possibility of  rhinitis. Mild rightward septal deviation. Electronically Signed   By: Genevie Ann M.D.   On: 02/04/2019 00:37     Microbiology: Recent Results (from the past 240 hour(s))  SARS Coronavirus 2 [facility] order, Performed in [facility] hospital lab) Nasopharyngeal Nasopharyngeal Swab     Status: None   Collection Time: 02/23/19  4:01 PM   Specimen: Nasopharyngeal Swab  Result Value Ref Range Status   SARS Coronavirus 2 NEGATIVE NEGATIVE Final    Comment: (NOTE) If result is NEGATIVE SARS-CoV-2 target nucleic acids are NOT DETECTED. The SARS-CoV-2 RNA is generally detectable in upper and lower  respiratory specimens during the acute phase of infection. The lowest  concentration of SARS-CoV-2 viral copies this assay can detect is 250  copies / mL. A negative result does not preclude SARS-CoV-2 infection  and should not be used as the sole basis for treatment or other  patient management decisions.  A negative result may occur with  improper specimen collection / handling, submission of specimen other  than nasopharyngeal swab, presence of viral mutation(s) within the  areas targeted by this assay, and inadequate number of viral copies  (<250 copies / mL). A negative result must be  combined with clinical  observations, patient history, and epidemiological information. If result is POSITIVE SARS-CoV-2 target nucleic acids are DETECTED. The SARS-CoV-2 RNA is generally detectable in upper and lower  respiratory specimens dur ing the acute phase of infection.  Positive  results are indicative of active infection with SARS-CoV-2.  Clinical  correlation with patient history and other diagnostic information is  necessary to determine patient infection status.  Positive results do  not rule out bacterial infection or co-infection with other viruses. If result is PRESUMPTIVE POSTIVE SARS-CoV-2 nucleic acids MAY BE PRESENT.   A presumptive positive result was obtained on the submitted specimen  and confirmed on repeat testing.  While 2019 novel coronavirus  (SARS-CoV-2) nucleic acids may be present in the submitted sample  additional confirmatory testing may be necessary for epidemiological  and / or clinical management purposes  to differentiate between  SARS-CoV-2 and other Sarbecovirus currently known to infect humans.  If clinically indicated additional testing with an alternate test  methodology [phone]) is advised. The SARS-CoV-2 RNA is generally  detectable in upper and lower respiratory sp ecimens during the acute  phase of infection. The expected result is Negative. Fact Sheet for Patients:  StrictlyIdeas.no Fact Sheet for Healthcare Providers: BankingDealers.co.za This test is not yet approved or cleared by the [place] FDA and has been authorized for detection and/or diagnosis of SARS-CoV-2 by FDA under an Emergency Use Authorization (EUA).  This EUA will remain in effect (meaning this test can be used) for the duration of the COVID-19 declaration under Section 564(b)(1) of the Act, 21 U.S.C. section 360bbb-3(b)(1), unless the authorization is terminated or revoked sooner. Performed at [facility],  [address] [name], [place] [address]   Culture, Urine     Status: Abnormal   Collection Time: 02/28/19  2:31 PM   Specimen: Urine, Clean Catch  Result Value Ref Range Status   Specimen Description   Final    URINE, CLEAN CATCH Performed at [facility], [address] [place], [place] [address]    Special Requests   Final    NONE Performed at [facility], [address], [place], [place] [address]    Culture >=100,000 COLONIES/mL KLEBSIELLA PNEUMONIAE (A)  Final   Report Status 03/03/2019 FINAL  Final  Organism ID, Bacteria KLEBSIELLA PNEUMONIAE (A)  Final      Susceptibility   Klebsiella pneumoniae - MIC*    AMPICILLIN >=32 RESISTANT Resistant     CEFAZOLIN <=4 SENSITIVE Sensitive     CEFTRIAXONE <=1 SENSITIVE Sensitive     CIPROFLOXACIN <=0.25 SENSITIVE Sensitive     GENTAMICIN <=1 SENSITIVE Sensitive     IMIPENEM <=0.25 SENSITIVE Sensitive     NITROFURANTOIN 128 RESISTANT Resistant     TRIMETH/SULFA <=20 SENSITIVE Sensitive     AMPICILLIN/SULBACTAM 4 SENSITIVE Sensitive     PIP/TAZO <=4 SENSITIVE Sensitive     Extended ESBL NEGATIVE Sensitive     * >=100,000 COLONIES/mL KLEBSIELLA PNEUMONIAE     Labs: Basic Metabolic Panel: Recent Labs  Lab 03/01/19 0613 03/02/19 0648 03/03/19 0445 03/04/19 0454 03/05/19 0522  NA 134* 136 137 138 135  K 3.9 3.8 3.7 3.7 3.7  CL 101 102 104 105 102  CO2 25 26 25 25 24   GLUCOSE 123* 151* 136* 122* 123*  BUN 57* 61* 62* 60* 54*  CREATININE 3.43* 3.41* 3.36* 3.35* 3.14*  CALCIUM 8.1* 8.2* 8.2* 8.2* 8.4*  MG  --   --   --  2.0  --   PHOS 4.0 3.3 3.4 3.8 3.9   Liver Function Tests: Recent Labs  Lab 03/01/19 0613 03/02/19 0648 03/03/19 0445 03/04/19 0454 03/05/19 0522  ALBUMIN 2.6* 2.5* 2.4* 2.3* 2.5*   No results for input(s): LIPASE, AMYLASE in the last 168 hours. No results for input(s): AMMONIA in the last 168 hours. CBC: Recent Labs  Lab 02/27/19 0458 03/04/19 0454  WBC 12.6* 12.8*  NEUTROABS  --  10.5*  HGB 8.0*  8.2*  HCT 25.7* 25.4*  MCV 85.4 83.8  PLT 204 221   Cardiac Enzymes: No results for input(s): CKTOTAL, CKMB, CKMBINDEX, TROPONINI in the last 168 hours. BNP: Invalid input(s): POCBNP CBG: Recent Labs  Lab 03/04/19 1632 03/04/19 2119 03/05/19 0307 03/05/19 0738 03/05/19 1111  GLUCAP 131* 158* 140* 147* 165*    Time coordinating discharge:  36 minutes  Signed:  Orson Eva, DO [facility] Pager: [phone] 03/05/2019, 2:00 PM

## 2019-03-05 NOTE — TOC Progression Note (Signed)
Transition of Care [facility]) - Progression Note    Patient Details  Name: Danny [name] MRN: [id number] Date of Birth: Oct 16, 1955  [facility] CM/SW Contact  Ihor Gully, LCSW Phone Number: 03/05/2019, 12:27 PM  Clinical Narrative:    Spoke with patient about rehab offer from [facility]. Patient is agreeable to short term rehab and indicates that he needs rehab.     Expected Discharge Plan: [place] Barriers to Discharge: No Barriers Identified  Expected Discharge Plan and Services Expected Discharge Plan: [place]   Discharge Planning Services: CM Consult Post Acute Care Choice: [place] Living arrangements for the past 2 months: Apartment Expected Discharge Date: 02/28/19                                     Social Determinants of Health (SDOH) Interventions    Readmission Risk Interventions No flowsheet data found.

## 2019-03-05 NOTE — Progress Notes (Signed)
Report called and given to nursing staff at the [facility]. Plan is for [facility] staff to pick up patient after supper. Will continue to monitor.

## 2019-03-05 NOTE — Progress Notes (Signed)
Physical Therapy Note  Patient Details  Name: Danny [name] MRN: [id number] Date of Birth: 02-20-1956 Today's Date: 03/05/2019    Pt refused participation with therapy today stating he just wanted to rest.  Teena Irani, PTA/CLT [place], [place] 03/05/2019, 9:16 AM

## 2019-03-05 NOTE — Care Management Important Message (Signed)
Important Message  Patient Details  Name: Danny [name] MRN: [id number] Date of Birth: 1955/09/25   Medicare Important Message Given:  Yes     [name] 03/05/2019, 2:02 PM

## 2019-03-05 NOTE — Progress Notes (Signed)
[name] Progress Note    Assessment/ Plan:   # AKI/CKD stage 4- in setting of decompensated CHF, NSAID use with concomitant ACE inhibition.  Note also bladder outlet obstruction with hydro which is improved on repeat imaging as below. His lisinopril was discontinued and he has diuresed well.  Renal US consistent with chronic medical renal disease.  Renal has discussed the ways to delay the progression of ckd with optimizing HTN and DM management.  - ACE/ARB currently on hold due to AKI -> may not be able to restart bec of degree of renal dysfunction - No indication for dialysis, however he will need close follow up and education as he has had progressive CKD over the past 3 years. - Renal function stable on po torsemide -> continue 20mg  daily of torsemide (on discharge will need to discontinue furosemide as an outpatient) -Continue foley for now with profound acute retention with hydro in setting of UTI and s/p failed voiding trial as below.  He will need urology follow-up after discharge  - In light of renal function would renally dose cefazolin - consider half of normal suggested dose for the indication every 12 hours or per pharmacy recommendation.    # Acute diastolic CHF/anasarca- Not sure if this is related to right sided CHF, progressive CKD, or nephrotic syndrome. 24 hour urine protein with 3 grams and low albumin consistent with nephrotic syndrome likely due to Diabetic nephropathy but has never had renal biopsy. Cont to follow. May have component of secondary FSGS as well.  SPEP/UPEP negative  - Continue torsemide 20 mg daily and follow response.   One time only additional dose on 9/10 as well to optimize volume status  # Anemia of CKD- as well as low iron stores.  Note s/p IV iron on 9/1 and 9/8 - ESA initiated 9/8 (Aranesp 60 mcg) - Continue to follow H/H while he remains an inpatient - Will need outpatient ESA after discharge (to be set up through [place])    #  Bladder outlet obstruction and acute urinary retention - CT a/p on 9/4 with mild bilateral hydronephrosis and moderate urinary bladder distention noted - Note per charting foley placed on 9/4 and 1775 mL cloudy foul smelling urine returned.  Note he has also failed a voiding trial per primary team  - Continues with foley - Abx for UTI per primary team  - on flomax - Will need urology follow-up on discharge  # Bilateral hydronephrosis secondary to bladder outlet obstruction and resolved on renal US post-foley on 9/5   # HTN- stableoff of lisinopril.   # DM - per primary svc  # Obesity- have stressed importance of diet/weight loss  # HLD- on statin  # Chronic stasis dermatitis- cont with diuretics and agree with UNNA boots if tolerates  # Disposition- per primary team; awaiting placement.  We will arrange for follow up at our office after discharge in the next 2-3 weeks.  Will also need  f/u with his PCP and urology as noted.    Subjective:   Still has a cough - a little better with PRN's per pt.  Had 2.7 liters UOP over 9/8.  He is not sure of dispo plans but will eventually be at a SNF per charting.  Spoke with team and he may go to SNF today vs tomorrow.   Review of systems Denies shortness of breath or chest pain Denies nausea or vomiting  Urinating via foley    Objective:  BP 134/72 (BP Location: Left Arm)   Pulse 73   Temp 99.4 F (37.4 C) (Oral)   Resp 20   Ht 6' (1.829 m)   Wt 135.6 kg   SpO2 97%   BMI 40.54 kg/m   Intake/Output Summary (Last 24 hours) at 03/05/2019 0916 Last data filed at 03/05/2019 0700 Gross per 24 hour  Intake 700 ml  Output 2700 ml  Net -2000 ml   Weight change: -1 kg  Physical Exam: General adult male in bed in no acute distress HEENT normocephalic atraumatic extraocular movements intact sclera anicteric Neck supple trachea midline Lungs clear to auscultation bilaterally normal work of breathing at rest Heart regular rate and rhythm  no rubs Abdomen soft nontender distended/obese habitus  Extremities 1-2+ edema; chronic venous stasis and some erythema lower extremities Psych normal mood and affect Neuro alert and oriented x 3 GU foley in place; was being pulled tight with the foley lock and nursing at bedside to adjust   Imaging: US Arterial Abi (screening Lower Extremity)  Result Date: 03/04/2019 CLINICAL DATA:  63 year old male with a history of vascular risk factors EXAM: NONINVASIVE PHYSIOLOGIC VASCULAR STUDY OF BILATERAL LOWER EXTREMITIES TECHNIQUE: Evaluation of both lower extremities was performed at rest, including calculation of ankle-brachial indices, multiple segmental pressure evaluation, segmental Doppler and segmental pulse volume recording. COMPARISON:  None. FINDINGS: Right ABI:  1.45 Left ABI:  1.43 Right Lower Extremity:  PVR maintained at the ankle Left Lower Extremity:  PVR maintained at the ankle IMPRESSION: Resting ABI of the bilateral lower extremities borderline elevated, with the waveforms indicating no significant arterial occlusive disease. Signed, Dulcy Fanny. Dellia Nims, RPVI [facility] Electronically Signed   By: Corrie Mckusick D.O.   On: 03/04/2019 07:17    Labs: BMET Recent Labs  Lab 02/27/19 0458 02/28/19 0420 03/01/19 6606 03/02/19 0648 03/03/19 0445 03/04/19 0454 03/05/19 0522  NA 135 134* 134* 136 137 138 135  K 4.0 4.2 3.9 3.8 3.7 3.7 3.7  CL 103 101 101 102 104 105 102  CO2 24 24 25 26 25 25 24   GLUCOSE 174* 181* 123* 151* 136* 122* 123*  BUN 57* 54* 57* 61* 62* 60* 54*  CREATININE 3.74* 3.43* 3.43* 3.41* 3.36* 3.35* 3.14*  CALCIUM 8.2* 8.4* 8.1* 8.2* 8.2* 8.2* 8.4*  PHOS 4.8* 4.3 4.0 3.3 3.4 3.8 3.9   CBC Recent Labs  Lab 02/27/19 0458 03/04/19 0454  WBC 12.6* 12.8*  NEUTROABS  --  10.5*  HGB 8.0* 8.2*  HCT 25.7* 25.4*  MCV 85.4 83.8  PLT 204 221    Medications:    . allopurinol  300 mg Oral Daily  .  amLODipine  10 mg Oral QHS  . enoxaparin (LOVENOX) injection  40 mg Subcutaneous Q24H  . folic acid  1 mg Oral Daily  . guaiFENesin  600 mg Oral BID  . insulin aspart  0-9 Units Subcutaneous TID WC  . insulin aspart protamine- aspart  25 Units Subcutaneous BID WC  . metoprolol succinate  100 mg Oral QPM  . saccharomyces boulardii  250 mg Oral BID  . tamsulosin  0.4 mg Oral QPC supper  . torsemide  20 mg Oral Daily     Claudia Desanctis 03/05/2019, 9:16 AM

## 2019-03-06 ENCOUNTER — Non-Acute Institutional Stay (SKILLED_NURSING_FACILITY): Payer: Medicare HMO | Admitting: Adult Health

## 2019-03-06 ENCOUNTER — Encounter: Payer: Self-pay | Admitting: Adult Health

## 2019-03-06 DIAGNOSIS — N183 Chronic kidney disease, stage 3 unspecified: Secondary | ICD-10-CM

## 2019-03-06 DIAGNOSIS — L409 Psoriasis, unspecified: Secondary | ICD-10-CM | POA: Diagnosis not present

## 2019-03-06 DIAGNOSIS — I5033 Acute on chronic diastolic (congestive) heart failure: Secondary | ICD-10-CM

## 2019-03-06 DIAGNOSIS — N3080 Other cystitis without hematuria: Secondary | ICD-10-CM | POA: Diagnosis not present

## 2019-03-06 DIAGNOSIS — I129 Hypertensive chronic kidney disease with stage 1 through stage 4 chronic kidney disease, or unspecified chronic kidney disease: Secondary | ICD-10-CM

## 2019-03-06 DIAGNOSIS — E785 Hyperlipidemia, unspecified: Secondary | ICD-10-CM

## 2019-03-06 DIAGNOSIS — R338 Other retention of urine: Secondary | ICD-10-CM | POA: Diagnosis not present

## 2019-03-06 DIAGNOSIS — E1122 Type 2 diabetes mellitus with diabetic chronic kidney disease: Secondary | ICD-10-CM | POA: Diagnosis not present

## 2019-03-06 DIAGNOSIS — N3090 Cystitis, unspecified without hematuria: Secondary | ICD-10-CM

## 2019-03-06 DIAGNOSIS — Z794 Long term (current) use of insulin: Secondary | ICD-10-CM

## 2019-03-06 DIAGNOSIS — M1A39X0 Chronic gout due to renal impairment, multiple sites, without tophus (tophi): Secondary | ICD-10-CM | POA: Diagnosis not present

## 2019-03-06 DIAGNOSIS — E1169 Type 2 diabetes mellitus with other specified complication: Secondary | ICD-10-CM | POA: Diagnosis not present

## 2019-03-06 LAB — PROTEIN, URINE, 24 HOUR

## 2019-03-06 NOTE — Progress Notes (Signed)
Location:    [place] Room Number: 153/P Place of Service:  SNF (31)   CODE STATUS: Full Code  Allergies  Allergen Reactions  . Dust Mite Extract Itching and Other (See Comments)    Unknown reaction-potential shortness of breath  . Prednisone Nausea And Vomiting  . Rocephin [Ceftriaxone] Nausea And Vomiting    Chief Complaint  Patient presents with  . Hospitalization Follow-up    HPI:  Danny [name] is a 63 year old man who has been hospitalized from 02-23-19 through 03-04-29.  Danny [name] presented to the ED after 2 week history of increased shortness of breath; edema. Danny [name] was treated for acute on chronic diastolic heart failure; acute resp. Failure. Danny [name] was treated for emphysematous cystis /BOO. Danny [name] denies any cough shorntess of breath or worsening edema. Danny [name] denies any changes in his appetite; no uncontrolled pain. Danny [name] will continue to be followed for his chronic illnesses including: hypertension; gout; chf.   Past Medical History:  Diagnosis Date  . Arthritis   . Diabetes mellitus without complication ([place])   . Foot ulcer ([place])   . Hypertension     Past Surgical History:  Procedure Laterality Date  . ANKLE SURGERY    . CHOLECYSTECTOMY    . FOOT SURGERY      Social History   Socioeconomic History  . Marital status: Divorced    Spouse name: Not on file  . Number of children: Not on file  . Years of education: Not on file  . Highest education level: Not on file  Occupational History  . Not on file  Social Needs  . Financial resource strain: Not on file  . Food insecurity    Worry: Not on file    Inability: Not on file  . Transportation needs    Medical: Not on file    Non-medical: Not on file  Tobacco Use  . Smoking status: Never Smoker  . Smokeless tobacco: Never Used  Substance and Sexual Activity  . Alcohol use: No  . Drug use: No  . Sexual activity: Not on file  Lifestyle  . Physical activity    Days per week: Not on file    Minutes per session:  Not on file  . Stress: Not on file  Relationships  . Social Herbalist on phone: Not on file    Gets together: Not on file    Attends religious service: Not on file    Active member of club or organization: Not on file    Attends meetings of clubs or organizations: Not on file    Relationship status: Not on file  . Intimate partner violence    Fear of current or ex partner: Not on file    Emotionally abused: Not on file    Physically abused: Not on file    Forced sexual activity: Not on file  Other Topics Concern  . Not on file  Social History Narrative  . Not on file   History reviewed. No pertinent family history.    VITAL SIGNS BP 137/84   Pulse 65   Temp 98.2 F (36.8 C) (Oral)   Resp 20   Ht 6' (1.829 m)   Wt 296 lb 8 oz (134.5 kg)   BMI 40.21 kg/m   Outpatient Encounter Medications as of 03/06/2019  Medication Sig  . allopurinol (ZYLOPRIM) 300 MG tablet Take 1 tablet by mouth daily.  Marland Kitchen amLODipine (NORVASC) 10 MG tablet Take 10 mg by mouth  at bedtime.  . Brodalumab 210 MG/1.5ML SOSY Inject 210 mg into the skin every 14 (fourteen) days.  . cephALEXin (KEFLEX) 500 MG capsule Take 1 capsule (500 mg total) by mouth every 8 (eight) hours. X 10 days  . Colloidal Oatmeal (ECZEMA MOISTURIZING EX) Apply 1 application topically daily as needed (applied to legs).  . folic acid (FOLVITE) 1 MG tablet Take 1 tablet (1 mg total) by mouth daily.  . insulin aspart protamine- aspart (NOVOLOG MIX 70/30) (70-30) 100 UNIT/ML injection Inject 0.25 mLs (25 Units total) into the skin 2 (two) times daily with a meal.  . metoprolol succinate (TOPROL-XL) 100 MG 24 hr tablet Take 100 mg by mouth every evening.   . NON FORMULARY Diet: Regular, NAS, Consistent Carbohydrate  . pravastatin (PRAVACHOL) 80 MG tablet Take 80 mg by mouth every evening.   . senna-docusate (SENOKOT-S) 8.6-50 MG tablet Take 2 tablets by mouth at bedtime as needed for moderate constipation.  . tamsulosin  (FLOMAX) 0.4 MG CAPS capsule Take 1 capsule (0.4 mg total) by mouth daily after supper.  . torsemide (DEMADEX) 20 MG tablet Take 1 tablet (20 mg total) by mouth daily.   No facility-administered encounter medications on file as of 03/06/2019.      SIGNIFICANT DIAGNOSTIC EXAMS  TODAY;   02-23-19" chest x-ray:  1. Mild to moderate enlargement of the cardiopericardial silhouette with suspected small bilateral pleural effusions but no current Edema  02-24-19: 2-d echo:    1. The left ventricle has hyperdynamic systolic function, with an ejection fraction of >65%. The cavity size was normal. There is mildly increased left ventricular wall thickness. Possible restrictive diastolic filling pattern. No evidence of left  ventricular regional wall motion abnormalities.  2. The right ventricle has low normal systolic function. The cavity was normal. There is no increase in right ventricular wall thickness. Right ventricular systolic pressure is severely elevated with an estimated pressure of 63.0 mmHg.  3. Left atrial size was moderately dilated.  4. The aortic valve is tricuspid. Moderate aortic annular calcification noted.  5. The mitral valve is grossly normal. There is mild mitral annular calcification present.  6. The tricuspid valve is grossly normal.  7. The aorta is normal unless otherwise noted.  8. The inferior vena cava was dilated in size with <50% respiratory variability.   02-24-19: renal ultrasound: Findings suggestive of chronic medical renal disease. Partially exophytic mid right kidney simple cyst. Small amount of bilateral perinephric stranding and left perinephric fluid.   02-27-19: chest x-ray: Enlarged cardiomediastinal silhouette with slightly worsened vascular congestion and small bilateral effusion  02-28-19: ct of abdomen and pelvis:  Mild bilateral hydroureteronephrosis is noted without obstructing ureteral calculus. Moderate urinary bladder distention is noted and this is  concerning for possible bladder outlet obstruction. Small amount emphysematous changes noted in the anterior portion of the urinary bladder wall which may represent benign pneumatosis, but possible emphysematous cystitis cannot be excluded. Clinical correlation is recommended. Nonobstructive right renal calculus. 3.5 cm left adrenal adenoma. Small bilateral pleural effusions with adjacent subsegmental atelectasis. Moderate size fat containing left inguinal hernia. Aortic Atherosclerosis   03-01-19 renal ultrasound: No sonographic evidence of hydronephrosis in the bilateral kidneys.   03-01-19: bilateral ABI: Right ABI:  1.45 Left ABI:  1.43 Resting ABI of the bilateral lower extremities borderline elevated,with the waveforms indicating no significant arterial occlusive disease.   LABS REVIEWED TODAY;   02-23-19: wbc 9.4; hgb 8.8; hct 28.2; mcv 84.9; plt 219; glucose 70; bun 43; creat  2.86; k+ 4.1; na++ 13; ca 8.6; direct bili: 0.3; albumin 2.9; hgb a1c 7.1; BNP 201.0 02-24-19: hgb a1c 7.1; iron 41; tibc 347 02-26-19: wbc 9.3; hgb 7.6; hct 24.7; mcv 88.2 plt 193; glucose 118; bun 57; creat 3.69; k+ 4.4; na++ 136; ca 7.9; phos 5.3; albumin 2.7 chol 95; ldl 46; trig 113; hdl 26; vit B 12: 1273 folate 19.5 02-28-19: urine culture: klebsiella pneumoniae  03-04-19:  glucose 122; bun 60; creat 3.35; k+ 3.7; na++ 138; ca 8.2; phos 3.8; albumin 2.3 03-05-19: wbc 10.2; hgb 9.0; hct 28.8; mcv 84.2; plt 256; glucose 123; bun 54; creat 3.14 k+ 3.7; na++ 135; ca 8.4; phos 3.9; albumin 2.5    Review of Systems  Constitutional: Negative for malaise/fatigue.  Respiratory: Negative for cough and shortness of breath.   Cardiovascular: Negative for chest pain, palpitations and leg swelling.  Gastrointestinal: Negative for abdominal pain, constipation and heartburn.  Musculoskeletal: Negative for back pain, joint pain and myalgias.  Skin: Negative.   Neurological: Negative for dizziness.  Psychiatric/Behavioral: The  patient is not nervous/anxious.     Physical Exam Constitutional:      General: Danny [name] is not in acute distress.    Appearance: Danny [name] is well-developed. Danny [name] is morbidly obese. Danny [name] is not diaphoretic.  Neck:     Musculoskeletal: Neck supple.     Thyroid: No thyromegaly.  Cardiovascular:     Rate and Rhythm: Normal rate and regular rhythm.     Heart sounds: Normal heart sounds.     Comments: Pedal pulses very faint  Pulmonary:     Effort: Pulmonary effort is normal. No respiratory distress.     Breath sounds: Normal breath sounds.  Abdominal:     General: Bowel sounds are normal. There is no distension.     Palpations: Abdomen is soft.     Tenderness: There is no abdominal tenderness.  Genitourinary:    Comments: Foley  Musculoskeletal:     Right lower leg: Edema present.     Left lower leg: Edema present.     Comments: 2-3 + bilateral lower extremity edema Is able to move all extremities   Lymphadenopathy:     Cervical: No cervical adenopathy.  Skin:    General: Skin is warm and dry.     Comments: Bilateral lower extremities red and hard to touch  Scabs present   Neurological:     Mental Status: Danny [name] is alert and oriented to person, place, and time.  Psychiatric:        Mood and Affect: Mood normal.      ASSESSMENT/ PLAN:  TODAY   1. Hypertension associated with stage 3 chronic kidney disease due to diabetes type 2: is stable b/p 137/84 will continue norvasc 10 mg daily   2. Acute on chronic diastolic congestive heart failure: is stable EF >65% (02-24-19) will continue demedex 20 mg daily   3. Controlled type 2 diabetes with stage 3 chronic kidney disease with long term current use of insulin: is stable hgb a1c 7.1; will continue novolog mix 70/30 25 units twice daily   4. Dyslipidemia associated with type 2 diabetes mellitus: is stable LDL 46 will continue pravachol 80 mg daily   5. Psoriasis: is stable will continue brodulumab 210 mg injection every 14 days; folic acid 1 mg  daily   6. Acute urine retention: is without change will remove foley for a voiding trial will continue flomax 0.4 mg daily   7. 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  Klebsiella cystitis/emphysematous cystitis: is stable has foley in place due to acute urinary retention will complete keflex and will monitor his status.   On 03-13-19 will check cbc cmp      MD is aware of resident's narcotic use and is in agreement with current plan of care. We will attempt to wean resident as appropriate.  Ok Edwards NP [facility]  Contact [phone] Monday through Friday 8am- 5pm  After hours call [phone]

## 2019-03-09 DIAGNOSIS — E1122 Type 2 diabetes mellitus with diabetic chronic kidney disease: Secondary | ICD-10-CM | POA: Insufficient documentation

## 2019-03-09 DIAGNOSIS — M1A30X0 Chronic gout due to renal impairment, unspecified site, without tophus (tophi): Secondary | ICD-10-CM | POA: Insufficient documentation

## 2019-03-09 DIAGNOSIS — Z794 Long term (current) use of insulin: Secondary | ICD-10-CM | POA: Insufficient documentation

## 2019-03-09 DIAGNOSIS — I129 Hypertensive chronic kidney disease with stage 1 through stage 4 chronic kidney disease, or unspecified chronic kidney disease: Secondary | ICD-10-CM | POA: Insufficient documentation

## 2019-03-09 DIAGNOSIS — E1169 Type 2 diabetes mellitus with other specified complication: Secondary | ICD-10-CM | POA: Insufficient documentation

## 2019-03-09 DIAGNOSIS — E1121 Type 2 diabetes mellitus with diabetic nephropathy: Secondary | ICD-10-CM | POA: Insufficient documentation

## 2019-03-09 DIAGNOSIS — N183 Chronic kidney disease, stage 3 unspecified: Secondary | ICD-10-CM | POA: Insufficient documentation

## 2019-03-11 ENCOUNTER — Encounter: Payer: Self-pay | Admitting: Adult Health

## 2019-03-11 ENCOUNTER — Non-Acute Institutional Stay (SKILLED_NURSING_FACILITY): Payer: Medicare HMO | Admitting: Adult Health

## 2019-03-11 DIAGNOSIS — L97319 Non-pressure chronic ulcer of right ankle with unspecified severity: Secondary | ICD-10-CM

## 2019-03-11 DIAGNOSIS — E11621 Type 2 diabetes mellitus with foot ulcer: Secondary | ICD-10-CM | POA: Diagnosis not present

## 2019-03-11 DIAGNOSIS — L97521 Non-pressure chronic ulcer of other part of left foot limited to breakdown of skin: Secondary | ICD-10-CM | POA: Diagnosis not present

## 2019-03-11 DIAGNOSIS — E11622 Type 2 diabetes mellitus with other skin ulcer: Secondary | ICD-10-CM

## 2019-03-11 NOTE — Progress Notes (Signed)
Location:    [place] Room Number: 153/P Place of Service:  SNF (31)   CODE STATUS: Full Code  Allergies  Allergen Reactions  . Dust Mite Extract Itching and Other (See Comments)    Unknown reaction-potential shortness of breath  . Prednisone Nausea And Vomiting  . Rocephin [Ceftriaxone] Nausea And Vomiting    Chief Complaint  Patient presents with  . Acute Visit    Wound Management,     HPI:  He has diabetic ulcerations on his left foot and right ankle. There are no reports of uncontrolled pain. No reports of fevers; no signs of infection present.   Past Medical History:  Diagnosis Date  . Arthritis   . Diabetes mellitus without complication ([place])   . Foot ulcer ([place])   . Hypertension     Past Surgical History:  Procedure Laterality Date  . ANKLE SURGERY    . CHOLECYSTECTOMY    . FOOT SURGERY      Social History   Socioeconomic History  . Marital status: Divorced    Spouse name: Not on file  . Number of children: Not on file  . Years of education: Not on file  . Highest education level: Not on file  Occupational History  . Not on file  Social Needs  . Financial resource strain: Not on file  . Food insecurity    Worry: Not on file    Inability: Not on file  . Transportation needs    Medical: Not on file    Non-medical: Not on file  Tobacco Use  . Smoking status: Never Smoker  . Smokeless tobacco: Never Used  Substance and Sexual Activity  . Alcohol use: No  . Drug use: No  . Sexual activity: Not on file  Lifestyle  . Physical activity    Days per week: Not on file    Minutes per session: Not on file  . Stress: Not on file  Relationships  . Social Herbalist on phone: Not on file    Gets together: Not on file    Attends religious service: Not on file    Active member of club or organization: Not on file    Attends meetings of clubs or organizations: Not on file    Relationship status: Not on file  .  Intimate partner violence    Fear of current or ex partner: Not on file    Emotionally abused: Not on file    Physically abused: Not on file    Forced sexual activity: Not on file  Other Topics Concern  . Not on file  Social History Narrative  . Not on file   History reviewed. No pertinent family history.    VITAL SIGNS BP 108/66   Pulse 64   Temp 98.6 F (37 C) (Oral)   Resp 20   Ht 6' (1.829 m)   Wt 291 lb (132 kg)   SpO2 98%   BMI 39.47 kg/m   Outpatient Encounter Medications as of 03/11/2019  Medication Sig  . allopurinol (ZYLOPRIM) 300 MG tablet Take 1 tablet by mouth daily.  . Amino Acids-Protein Hydrolys (FEEDING SUPPLEMENT, PRO-STAT SUGAR FREE 64,) LIQD Take 30 mLs by mouth 2 (two) times daily between meals.  Marland Kitchen amLODipine (NORVASC) 10 MG tablet Take 10 mg by mouth at bedtime.  . Brodalumab 210 MG/1.5ML SOSY Inject 210 mg into the skin every 14 (fourteen) days.  . cephALEXin (KEFLEX) 500 MG capsule Take 1  capsule (500 mg total) by mouth every 8 (eight) hours. X 10 days  . collagenase (SANTYL) ointment Ointment; 250 unit/gram; topical  Special Instructions: Apply to wounds per treatment orders.  . Colloidal Oatmeal (ECZEMA MOISTURIZING EX) Apply 1 application topically daily as needed (applied to legs).  . folic acid (FOLVITE) 1 MG tablet Take 1 tablet (1 mg total) by mouth daily.  . insulin aspart protamine- aspart (NOVOLOG MIX 70/30) (70-30) 100 UNIT/ML injection Inject 0.25 mLs (25 Units total) into the skin 2 (two) times daily with a meal.  . metoprolol succinate (TOPROL-XL) 100 MG 24 hr tablet Take 100 mg by mouth every evening.   . NON FORMULARY Diet: Regular, NAS, Consistent Carbohydrate  . pravastatin (PRAVACHOL) 80 MG tablet Take 80 mg by mouth every evening.   . senna-docusate (SENOKOT-S) 8.6-50 MG tablet Take 2 tablets by mouth at bedtime as needed for moderate constipation.  . silver nitrate applicators 73-71 % applicator Topical  Special Instructions:  Apply to rolled borders of wound to right lateral malleolus daily as needed for epithelized borders. Once A Day - PRN  . silver nitrate applicators 06-26 % applicator ; topical  Special Instructions: Apply to rolled borders of wound to left plantar foot daily as needed for epithelized borders. Once A Day - PRN PRN 1  . tamsulosin (FLOMAX) 0.4 MG CAPS capsule Take 1 capsule (0.4 mg total) by mouth daily after supper.  . torsemide (DEMADEX) 20 MG tablet Take 1 tablet (20 mg total) by mouth daily.   No facility-administered encounter medications on file as of 03/11/2019.      SIGNIFICANT DIAGNOSTIC EXAMS  PREVIOUS;   02-23-19" chest x-ray:  1. Mild to moderate enlargement of the cardiopericardial silhouette with suspected small bilateral pleural effusions but no current Edema  02-24-19: 2-d echo:    1. The left ventricle has hyperdynamic systolic function, with an ejection fraction of >65%. The cavity size was normal. There is mildly increased left ventricular wall thickness. Possible restrictive diastolic filling pattern. No evidence of left  ventricular regional wall motion abnormalities.  2. The right ventricle has low normal systolic function. The cavity was normal. There is no increase in right ventricular wall thickness. Right ventricular systolic pressure is severely elevated with an estimated pressure of 63.0 mmHg.  3. Left atrial size was moderately dilated.  4. The aortic valve is tricuspid. Moderate aortic annular calcification noted.  5. The mitral valve is grossly normal. There is mild mitral annular calcification present.  6. The tricuspid valve is grossly normal.  7. The aorta is normal unless otherwise noted.  8. The inferior vena cava was dilated in size with <50% respiratory variability.   02-24-19: renal ultrasound: Findings suggestive of chronic medical renal disease. Partially exophytic mid right kidney simple cyst. Small amount of bilateral perinephric stranding and  left perinephric fluid.   02-27-19: chest x-ray: Enlarged cardiomediastinal silhouette with slightly worsened vascular congestion and small bilateral effusion  02-28-19: ct of abdomen and pelvis:  Mild bilateral hydroureteronephrosis is noted without obstructing ureteral calculus. Moderate urinary bladder distention is noted and this is concerning for possible bladder outlet obstruction. Small amount emphysematous changes noted in the anterior portion of the urinary bladder wall which may represent benign pneumatosis, but possible emphysematous cystitis cannot be excluded. Clinical correlation is recommended. Nonobstructive right renal calculus. 3.5 cm left adrenal adenoma. Small bilateral pleural effusions with adjacent subsegmental atelectasis. Moderate size fat containing left inguinal hernia. Aortic Atherosclerosis   03-01-19 renal ultrasound: No sonographic  evidence of hydronephrosis in the bilateral kidneys.   03-01-19: bilateral ABI: Right ABI:  1.45 Left ABI:  1.43 Resting ABI of the bilateral lower extremities borderline elevated,with the waveforms indicating no significant arterial occlusive disease.  NO NEW EXAMS.    LABS REVIEWED PREVIOUS;   02-23-19: wbc 9.4; hgb 8.8; hct 28.2; mcv 84.9; plt 219; glucose 70; bun 43; creat 2.86; k+ 4.1; na++ 13; ca 8.6; direct bili: 0.3; albumin 2.9; hgb a1c 7.1; BNP 201.0 02-24-19: hgb a1c 7.1; iron 41; tibc 347 02-26-19: wbc 9.3; hgb 7.6; hct 24.7; mcv 88.2 plt 193; glucose 118; bun 57; creat 3.69; k+ 4.4; na++ 136; ca 7.9; phos 5.3; albumin 2.7 chol 95; ldl 46; trig 113; hdl 26; vit B 12: 1273 folate 19.5 02-28-19: urine culture: klebsiella pneumoniae  03-04-19:  glucose 122; bun 60; creat 3.35; k+ 3.7; na++ 138; ca 8.2; phos 3.8; albumin 2.3 03-05-19: wbc 10.2; hgb 9.0; hct 28.8; mcv 84.2; plt 256; glucose 123; bun 54; creat 3.14 k+ 3.7; na++ 135; ca 8.4; phos 3.9; albumin 2.5   NO NEW LABS.   Review of Systems  Constitutional: Negative for  malaise/fatigue.  Respiratory: Negative for cough and shortness of breath.   Cardiovascular: Negative for chest pain, palpitations and leg swelling.  Gastrointestinal: Negative for abdominal pain, constipation and heartburn.  Musculoskeletal: Negative for back pain, joint pain and myalgias.  Skin:       Has sores  Neurological: Negative for dizziness.  Psychiatric/Behavioral: The patient is not nervous/anxious.     Physical Exam Constitutional:      General: He is not in acute distress.    Appearance: He is morbidly obese. He is not diaphoretic.  Neck:     Musculoskeletal: Neck supple.     Thyroid: No thyromegaly.  Cardiovascular:     Rate and Rhythm: Normal rate and regular rhythm.     Heart sounds: Normal heart sounds.     Comments: Pedal pulses very faint  Pulmonary:     Effort: Pulmonary effort is normal. No respiratory distress.     Breath sounds: Normal breath sounds.  Abdominal:     General: Bowel sounds are normal. There is no distension.     Palpations: Abdomen is soft.     Tenderness: There is no abdominal tenderness.  Genitourinary:    Comments: Foley  Musculoskeletal: Normal range of motion.     Comments: 2-3 + bilateral lower extremity edema Is able to move all extremities    Lymphadenopathy:     Cervical: No cervical adenopathy.  Skin:    General: Skin is warm and dry.     Comments: Bilateral lower extremities red and hard to touch   Diabetic ulcerations;  Bottom left foot: 1.5 x 1.5 x 0.3 cm Right ankle: 2.5 x 3.0 cm  Neurological:     Mental Status: He is alert and oriented to person, place, and time.  Psychiatric:        Mood and Affect: Mood normal.       ASSESSMENT/ PLAN:  TODAY   1. Diabetic ulcer of right ankle 2. Diabetic ulcer of other part of  left foot associated with type 2 diabetes mellitus limited to breakdown of skin  Silver nitrate was applied Will continue current treatment  Will continue to monitor his status.       MD  is aware of resident's narcotic use and is in agreement with current plan of care. We will attempt to wean resident as appropriate.  Neoma Laming   NP [facility]  Contact [phone] Monday through Friday 8am- 5pm  After hours call [phone]

## 2019-03-12 ENCOUNTER — Encounter: Payer: Self-pay | Admitting: Adult Health

## 2019-03-12 ENCOUNTER — Non-Acute Institutional Stay (SKILLED_NURSING_FACILITY): Payer: Medicare HMO | Admitting: Adult Health

## 2019-03-12 DIAGNOSIS — N183 Chronic kidney disease, stage 3 unspecified: Secondary | ICD-10-CM

## 2019-03-12 DIAGNOSIS — E11622 Type 2 diabetes mellitus with other skin ulcer: Secondary | ICD-10-CM

## 2019-03-12 DIAGNOSIS — L97521 Non-pressure chronic ulcer of other part of left foot limited to breakdown of skin: Secondary | ICD-10-CM | POA: Diagnosis not present

## 2019-03-12 DIAGNOSIS — E11621 Type 2 diabetes mellitus with foot ulcer: Secondary | ICD-10-CM | POA: Diagnosis not present

## 2019-03-12 DIAGNOSIS — L97319 Non-pressure chronic ulcer of right ankle with unspecified severity: Secondary | ICD-10-CM | POA: Diagnosis not present

## 2019-03-12 DIAGNOSIS — Z794 Long term (current) use of insulin: Secondary | ICD-10-CM | POA: Diagnosis not present

## 2019-03-12 DIAGNOSIS — B372 Candidiasis of skin and nail: Secondary | ICD-10-CM

## 2019-03-12 DIAGNOSIS — I5033 Acute on chronic diastolic (congestive) heart failure: Secondary | ICD-10-CM | POA: Diagnosis not present

## 2019-03-12 DIAGNOSIS — E1122 Type 2 diabetes mellitus with diabetic chronic kidney disease: Secondary | ICD-10-CM

## 2019-03-12 NOTE — Progress Notes (Signed)
Location:    [place] Room Number: 153/P Place of Service:  SNF (31)   CODE STATUS: Full Code  Allergies  Allergen Reactions  . Dust Mite Extract Itching and Other (See Comments)    Unknown reaction-potential shortness of breath  . Prednisone Nausea And Vomiting  . Rocephin [Ceftriaxone] Nausea And Vomiting    Chief Complaint  Patient presents with  . Acute Visit    72 Hour Care Plan Meeting    HPI:  We have come together for his 43 hour care plan meeting. He does live alone; has 1 step. He does have a foley and feet ulcerations. He is ambulating in therapy. At this time he will not need dme. His cbg readings are elevated. He denies any excessive thirst or hunger. He does have a significant yeast rash in his groins.    Past Medical History:  Diagnosis Date  . Arthritis   . Diabetes mellitus without complication ([place])   . Foot ulcer ([place])   . Hypertension     Past Surgical History:  Procedure Laterality Date  . ANKLE SURGERY    . CHOLECYSTECTOMY    . FOOT SURGERY      Social History   Socioeconomic History  . Marital status: Divorced    Spouse name: Not on file  . Number of children: Not on file  . Years of education: Not on file  . Highest education level: Not on file  Occupational History  . Not on file  Social Needs  . Financial resource strain: Not on file  . Food insecurity    Worry: Not on file    Inability: Not on file  . Transportation needs    Medical: Not on file    Non-medical: Not on file  Tobacco Use  . Smoking status: Never Smoker  . Smokeless tobacco: Never Used  Substance and Sexual Activity  . Alcohol use: No  . Drug use: No  . Sexual activity: Not on file  Lifestyle  . Physical activity    Days per week: Not on file    Minutes per session: Not on file  . Stress: Not on file  Relationships  . Social Herbalist on phone: Not on file    Gets together: Not on file    Attends religious service:  Not on file    Active member of club or organization: Not on file    Attends meetings of clubs or organizations: Not on file    Relationship status: Not on file  . Intimate partner violence    Fear of current or ex partner: Not on file    Emotionally abused: Not on file    Physically abused: Not on file    Forced sexual activity: Not on file  Other Topics Concern  . Not on file  Social History Narrative  . Not on file   History reviewed. No pertinent family history.    VITAL SIGNS BP 114/60   Pulse 70   Temp 98.1 F (36.7 C) (Oral)   Resp 20   Ht 6' (1.829 m)   Wt 291 lb (132 kg)   SpO2 98%   BMI 39.47 kg/m   Outpatient Encounter Medications as of 03/12/2019  Medication Sig  . allopurinol (ZYLOPRIM) 300 MG tablet Take 1 tablet by mouth daily.  . Amino Acids-Protein Hydrolys (FEEDING SUPPLEMENT, PRO-STAT SUGAR FREE 64,) LIQD Take 30 mLs by mouth 2 (two) times daily between meals.  Marland Kitchen  amLODipine (NORVASC) 10 MG tablet Take 10 mg by mouth at bedtime.  . Brodalumab 210 MG/1.5ML SOSY Inject 210 mg into the skin every 14 (fourteen) days.  . cephALEXin (KEFLEX) 500 MG capsule Take 1 capsule (500 mg total) by mouth every 8 (eight) hours. X 10 days  . collagenase (SANTYL) ointment Ointment; 250 unit/gram; topical  Special Instructions: Apply to wounds per treatment orders.  . Colloidal Oatmeal (ECZEMA MOISTURIZING EX) Apply 1 application topically daily as needed (applied to legs).  . folic acid (FOLVITE) 1 MG tablet Take 1 tablet (1 mg total) by mouth daily.  . insulin aspart protamine- aspart (NOVOLOG MIX 70/30) (70-30) 100 UNIT/ML injection Inject 0.25 mLs (25 Units total) into the skin 2 (two) times daily with a meal.  . Melatonin 1 MG CAPS Take 1 mg by mouth as needed. For insomnia  . metoprolol succinate (TOPROL-XL) 100 MG 24 hr tablet Take 100 mg by mouth every evening.   . NON FORMULARY Diet: Regular, NAS, Consistent Carbohydrate  . pravastatin (PRAVACHOL) 80 MG tablet Take  80 mg by mouth every evening.   . senna-docusate (SENOKOT-S) 8.6-50 MG tablet Take 2 tablets by mouth at bedtime as needed for moderate constipation.  . silver nitrate applicators 02-40 % applicator Topical  Special Instructions: Apply to rolled borders of wound to right lateral malleolus daily as needed for epithelized borders. Once A Day - PRN  . silver nitrate applicators 97-35 % applicator ; topical  Special Instructions: Apply to rolled borders of wound to left plantar foot daily as needed for epithelized borders. Once A Day - PRN PRN 1  . tamsulosin (FLOMAX) 0.4 MG CAPS capsule Take 1 capsule (0.4 mg total) by mouth daily after supper.  . torsemide (DEMADEX) 20 MG tablet Take 1 tablet (20 mg total) by mouth daily.   No facility-administered encounter medications on file as of 03/12/2019.      SIGNIFICANT DIAGNOSTIC EXAMS   PREVIOUS;   02-23-19" chest x-ray:  1. Mild to moderate enlargement of the cardiopericardial silhouette with suspected small bilateral pleural effusions but no current Edema  02-24-19: 2-d echo:    1. The left ventricle has hyperdynamic systolic function, with an ejection fraction of >65%. The cavity size was normal. There is mildly increased left ventricular wall thickness. Possible restrictive diastolic filling pattern. No evidence of left  ventricular regional wall motion abnormalities.  2. The right ventricle has low normal systolic function. The cavity was normal. There is no increase in right ventricular wall thickness. Right ventricular systolic pressure is severely elevated with an estimated pressure of 63.0 mmHg.  3. Left atrial size was moderately dilated.  4. The aortic valve is tricuspid. Moderate aortic annular calcification noted.  5. The mitral valve is grossly normal. There is mild mitral annular calcification present.  6. The tricuspid valve is grossly normal.  7. The aorta is normal unless otherwise noted.  8. The inferior vena cava was dilated  in size with <50% respiratory variability.   02-24-19: renal ultrasound: Findings suggestive of chronic medical renal disease. Partially exophytic mid right kidney simple cyst. Small amount of bilateral perinephric stranding and left perinephric fluid.   02-27-19: chest x-ray: Enlarged cardiomediastinal silhouette with slightly worsened vascular congestion and small bilateral effusion  02-28-19: ct of abdomen and pelvis:  Mild bilateral hydroureteronephrosis is noted without obstructing ureteral calculus. Moderate urinary bladder distention is noted and this is concerning for possible bladder outlet obstruction. Small amount emphysematous changes noted in the anterior portion of  the urinary bladder wall which may represent benign pneumatosis, but possible emphysematous cystitis cannot be excluded. Clinical correlation is recommended. Nonobstructive right renal calculus. 3.5 cm left adrenal adenoma. Small bilateral pleural effusions with adjacent subsegmental atelectasis. Moderate size fat containing left inguinal hernia. Aortic Atherosclerosis   03-01-19 renal ultrasound: No sonographic evidence of hydronephrosis in the bilateral kidneys.   03-01-19: bilateral ABI: Right ABI:  1.45 Left ABI:  1.43 Resting ABI of the bilateral lower extremities borderline elevated,with the waveforms indicating no significant arterial occlusive disease.  NO NEW EXAMS.    LABS REVIEWED PREVIOUS;   02-23-19: wbc 9.4; hgb 8.8; hct 28.2; mcv 84.9; plt 219; glucose 70; bun 43; creat 2.86; k+ 4.1; na++ 13; ca 8.6; direct bili: 0.3; albumin 2.9; hgb a1c 7.1; BNP 201.0 02-24-19: hgb a1c 7.1; iron 41; tibc 347 02-26-19: wbc 9.3; hgb 7.6; hct 24.7; mcv 88.2 plt 193; glucose 118; bun 57; creat 3.69; k+ 4.4; na++ 136; ca 7.9; phos 5.3; albumin 2.7 chol 95; ldl 46; trig 113; hdl 26; vit B 12: 1273 folate 19.5 02-28-19: urine culture: klebsiella pneumoniae  03-04-19:  glucose 122; bun 60; creat 3.35; k+ 3.7; na++ 138; ca 8.2; phos  3.8; albumin 2.3 03-05-19: wbc 10.2; hgb 9.0; hct 28.8; mcv 84.2; plt 256; glucose 123; bun 54; creat 3.14 k+ 3.7; na++ 135; ca 8.4; phos 3.9; albumin 2.5   NO NEW LABS.    Review of Systems  Constitutional: Negative for malaise/fatigue.  Respiratory: Negative for cough and shortness of breath.   Cardiovascular: Negative for chest pain, palpitations and leg swelling.  Gastrointestinal: Negative for abdominal pain, constipation and heartburn.  Musculoskeletal: Negative for back pain, joint pain and myalgias.  Skin:       Has sores   Neurological: Negative for dizziness.  Psychiatric/Behavioral: The patient is not nervous/anxious.     Physical Exam Constitutional:      General: He is not in acute distress.    Appearance: He is well-developed. He is morbidly obese. He is not diaphoretic.  Neck:     Musculoskeletal: Neck supple.     Thyroid: No thyromegaly.  Cardiovascular:     Rate and Rhythm: Normal rate and regular rhythm.     Heart sounds: Normal heart sounds.     Comments: Pedal pulses very faint  Pulmonary:     Effort: Pulmonary effort is normal. No respiratory distress.     Breath sounds: Normal breath sounds.  Abdominal:     General: Bowel sounds are normal. There is no distension.     Palpations: Abdomen is soft.     Tenderness: There is no abdominal tenderness.  Genitourinary:    Comments: Foley  Musculoskeletal: Normal range of motion.  Lymphadenopathy:     Cervical: No cervical adenopathy.  Skin:    General: Skin is warm and dry.     Comments: Has yeast rash in both groins  Bilateral lower extremities red and hard to touch  Diabetic ulcerations;  Bottom left foot: 1.5 x 1.5 x 0.3 cm Right ankle: 2.5 x 3.0 cm   Neurological:     Mental Status: He is alert and oriented to person, place, and time.  Psychiatric:        Mood and Affect: Mood normal.      ASSESSMENT/ PLAN:  TODAY  1. Controlled type 2 diabetes mellitus with stage 3 chronic kidney with  current long term use of insulin: worse 2. Acute on chronic diastolic congestive heart failure: stable 3. Diabetic ulcer  of right ankle 4. Diabetic ulcer of other part of left foot associated with type 2 diabetes mellitus limited to skin breakdown 5. Yeast infection of the skin  Will continue therapy as directed Will continue current plan of care Will increase novolog 70/30 mix to 28 units twice daily  Will begin diflucan 50 mg daily through 03-19-19.  His goal is to return back home at this time he does not need dme.     MD is aware of resident's narcotic use and is in agreement with current plan of care. We will attempt to wean resident as appropriate.  Ok Edwards NP [facility]  Contact [phone] Monday through Friday 8am- 5pm  After hours call [phone]

## 2019-03-13 ENCOUNTER — Encounter: Payer: Self-pay | Admitting: Adult Health

## 2019-03-13 ENCOUNTER — Non-Acute Institutional Stay (SKILLED_NURSING_FACILITY): Payer: Medicare HMO | Admitting: Adult Health

## 2019-03-13 ENCOUNTER — Encounter (HOSPITAL_COMMUNITY)
Admission: RE | Admit: 2019-03-13 | Discharge: 2019-03-13 | Disposition: A | Discharge status: Home / Self Care | Payer: Medicare HMO | Source: Ambulatory Visit | Attending: Internal Medicine | Admitting: Internal Medicine

## 2019-03-13 DIAGNOSIS — N183 Chronic kidney disease, stage 3 unspecified: Secondary | ICD-10-CM

## 2019-03-13 DIAGNOSIS — Z794 Long term (current) use of insulin: Secondary | ICD-10-CM

## 2019-03-13 DIAGNOSIS — I129 Hypertensive chronic kidney disease with stage 1 through stage 4 chronic kidney disease, or unspecified chronic kidney disease: Secondary | ICD-10-CM

## 2019-03-13 DIAGNOSIS — I10 Essential (primary) hypertension: Secondary | ICD-10-CM | POA: Diagnosis not present

## 2019-03-13 DIAGNOSIS — E1165 Type 2 diabetes mellitus with hyperglycemia: Secondary | ICD-10-CM | POA: Diagnosis not present

## 2019-03-13 DIAGNOSIS — E1122 Type 2 diabetes mellitus with diabetic chronic kidney disease: Secondary | ICD-10-CM

## 2019-03-13 DIAGNOSIS — N184 Chronic kidney disease, stage 4 (severe): Secondary | ICD-10-CM | POA: Diagnosis not present

## 2019-03-13 DIAGNOSIS — E785 Hyperlipidemia, unspecified: Secondary | ICD-10-CM | POA: Diagnosis not present

## 2019-03-13 DIAGNOSIS — I130 Hypertensive heart and chronic kidney disease with heart failure and stage 1 through stage 4 chronic kidney disease, or unspecified chronic kidney disease: Secondary | ICD-10-CM | POA: Insufficient documentation

## 2019-03-13 DIAGNOSIS — I5033 Acute on chronic diastolic (congestive) heart failure: Secondary | ICD-10-CM | POA: Diagnosis not present

## 2019-03-13 DIAGNOSIS — I739 Peripheral vascular disease, unspecified: Secondary | ICD-10-CM | POA: Insufficient documentation

## 2019-03-13 DIAGNOSIS — K219 Gastro-esophageal reflux disease without esophagitis: Secondary | ICD-10-CM | POA: Diagnosis not present

## 2019-03-13 DIAGNOSIS — E782 Mixed hyperlipidemia: Secondary | ICD-10-CM | POA: Diagnosis not present

## 2019-03-13 DIAGNOSIS — I5031 Acute diastolic (congestive) heart failure: Secondary | ICD-10-CM | POA: Insufficient documentation

## 2019-03-13 LAB — COMPREHENSIVE METABOLIC PANEL
ALT: 37 U/L (ref 0–44)
AST: 28 U/L (ref 15–41)
Albumin: 2.5 g/dL — ABNORMAL LOW (ref 3.5–5.0)
Alkaline Phosphatase: 125 U/L (ref 38–126)
Anion gap: 9 (ref 5–15)
BUN: 44 mg/dL — ABNORMAL HIGH (ref 8–23)
CO2: 23 mmol/L (ref 22–32)
Calcium: 8.6 mg/dL — ABNORMAL LOW (ref 8.9–10.3)
Chloride: 102 mmol/L (ref 98–111)
Creatinine, Ser: 3.07 mg/dL — ABNORMAL HIGH (ref 0.61–1.24)
GFR calc Af Amer: 23 mL/min — ABNORMAL LOW (ref >60)
GFR calc non Af Amer: 20 mL/min — ABNORMAL LOW (ref >60)
Glucose, Bld: 284 mg/dL — ABNORMAL HIGH (ref 70–99)
Potassium: 3.9 mmol/L (ref 3.5–5.1)
Sodium: 134 mmol/L — ABNORMAL LOW (ref 135–145)
Total Bilirubin: 0.3 mg/dL (ref 0.3–1.2)
Total Protein: 6.9 g/dL (ref 6.5–8.1)

## 2019-03-13 LAB — CBC
HCT: 29.3 % — ABNORMAL LOW (ref 39.0–52.0)
Hemoglobin: 9.3 g/dL — ABNORMAL LOW (ref 13.0–17.0)
MCH: 26.7 pg (ref 26.0–34.0)
MCHC: 31.7 g/dL (ref 30.0–36.0)
MCV: 84.2 fL (ref 80.0–100.0)
Platelets: 307 10*3/uL (ref 150–400)
RBC: 3.48 MIL/uL — ABNORMAL LOW (ref 4.22–5.81)
RDW: 17.7 % — ABNORMAL HIGH (ref 11.5–15.5)
WBC: 11 10*3/uL — ABNORMAL HIGH (ref 4.0–10.5)
nRBC: 0 % (ref 0.0–0.2)

## 2019-03-13 LAB — RENAL FUNCTION PANEL
Albumin: 2.5 g/dL — ABNORMAL LOW (ref 3.5–5.0)
Anion gap: 9 (ref 5–15)
BUN: 43 mg/dL — ABNORMAL HIGH (ref 8–23)
CO2: 24 mmol/L (ref 22–32)
Calcium: 8.6 mg/dL — ABNORMAL LOW (ref 8.9–10.3)
Chloride: 102 mmol/L (ref 98–111)
Creatinine, Ser: 3.02 mg/dL — ABNORMAL HIGH (ref 0.61–1.24)
GFR calc Af Amer: 24 mL/min — ABNORMAL LOW (ref >60)
GFR calc non Af Amer: 21 mL/min — ABNORMAL LOW (ref >60)
Glucose, Bld: 283 mg/dL — ABNORMAL HIGH (ref 70–99)
Phosphorus: 4.4 mg/dL (ref 2.5–4.6)
Potassium: 3.9 mmol/L (ref 3.5–5.1)
Sodium: 135 mmol/L (ref 135–145)

## 2019-03-13 LAB — MAGNESIUM: Magnesium: 1.7 mg/dL (ref 1.7–2.4)

## 2019-03-13 NOTE — Progress Notes (Signed)
Location:    [place] Room Number: 153/P Place of Service:  SNF (31)   CODE STATUS: Full Code  Allergies  Allergen Reactions  . Dust Mite Extract Itching and Other (See Comments)    Unknown reaction-potential shortness of breath  . Prednisone Nausea And Vomiting  . Rocephin [Ceftriaxone] Nausea And Vomiting   Chief Complaint  Patient presents with  . Medical Management of Chronic Issues        Controlled type 2 diabetes mellitus with stage 3 chronic kidney disease with long term current use of insulin:  Hypertension associated with stage 3 chronic kidney disease due to type 2 diabetes mellitus:   Acute on chronic diastolic congestive heart failure   Weekly follow up for the first 30 days post hospitalization      HPI:  He is a 63 year old short term rehab patient being seen for the management of his chronic illnesses: diabetes; hypertension; chf. His cbgs remain elevated. He denies any excessive hunger or thirst. He does not have high urine output. He denies any uncontrolled pain.   Past Medical History:  Diagnosis Date  . Arthritis   . Diabetes mellitus without complication ([place])   . Foot ulcer ([place])   . Hypertension     Past Surgical History:  Procedure Laterality Date  . ANKLE SURGERY    . CHOLECYSTECTOMY    . FOOT SURGERY      Social History   Socioeconomic History  . Marital status: Divorced    Spouse name: Not on file  . Number of children: Not on file  . Years of education: Not on file  . Highest education level: Not on file  Occupational History  . Not on file  Social Needs  . Financial resource strain: Not on file  . Food insecurity    Worry: Not on file    Inability: Not on file  . Transportation needs    Medical: Not on file    Non-medical: Not on file  Tobacco Use  . Smoking status: Never Smoker  . Smokeless tobacco: Never Used  Substance and Sexual Activity  . Alcohol use: No  . Drug use: No  . Sexual activity: Not  on file  Lifestyle  . Physical activity    Days per week: Not on file    Minutes per session: Not on file  . Stress: Not on file  Relationships  . Social Herbalist on phone: Not on file    Gets together: Not on file    Attends religious service: Not on file    Active member of club or organization: Not on file    Attends meetings of clubs or organizations: Not on file    Relationship status: Not on file  . Intimate partner violence    Fear of current or ex partner: Not on file    Emotionally abused: Not on file    Physically abused: Not on file    Forced sexual activity: Not on file  Other Topics Concern  . Not on file  Social History Narrative  . Not on file   History reviewed. No pertinent family history.    VITAL SIGNS BP 114/60   Pulse 70   Temp 98.1 F (36.7 C) (Oral)   Resp 20   Ht 6' (1.829 m)   Wt 291 lb (132 kg)   SpO2 98%   BMI 39.47 kg/m   Outpatient Encounter Medications as of 03/13/2019  Medication Sig  . allopurinol (ZYLOPRIM) 300 MG tablet Take 1 tablet by mouth daily.  . Amino Acids-Protein Hydrolys (FEEDING SUPPLEMENT, PRO-STAT SUGAR FREE 64,) LIQD Take 30 mLs by mouth 2 (two) times daily between meals.  Marland Kitchen amLODipine (NORVASC) 10 MG tablet Take 10 mg by mouth at bedtime.  . Brodalumab 210 MG/1.5ML SOSY Inject 210 mg into the skin every 14 (fourteen) days.  . cephALEXin (KEFLEX) 500 MG capsule Take 1 capsule (500 mg total) by mouth every 8 (eight) hours. X 10 days  . collagenase (SANTYL) ointment Ointment; 250 unit/gram; topical  Special Instructions: Apply to wounds per treatment orders.  . Colloidal Oatmeal (ECZEMA MOISTURIZING EX) Apply 1 application topically daily as needed (applied to legs).  . fluconazole (DIFLUCAN) 50 MG tablet Take 50 mg by mouth daily. For yeast infection  . folic acid (FOLVITE) 1 MG tablet Take 1 tablet (1 mg total) by mouth daily.  . insulin aspart protamine - aspart (NOVOLOG MIX 70/30 FLEXPEN) (70-30) 100  UNIT/ML FlexPen Inject 28 Units into the skin 2 (two) times daily with a meal.  . Melatonin 1 MG CAPS Take 1 mg by mouth as needed. For insomnia  . metoprolol succinate (TOPROL-XL) 100 MG 24 hr tablet Take 100 mg by mouth every evening.   . NON FORMULARY Diet: Regular, NAS, Consistent Carbohydrate  . pravastatin (PRAVACHOL) 80 MG tablet Take 80 mg by mouth every evening.   . senna-docusate (SENOKOT-S) 8.6-50 MG tablet Take 2 tablets by mouth at bedtime as needed for moderate constipation.  . silver nitrate applicators 71-69 % applicator Topical  Special Instructions: Apply to rolled borders of wound to right lateral malleolus daily as needed for epithelized borders. Once A Day - PRN  . silver nitrate applicators 67-89 % applicator ; topical  Special Instructions: Apply to rolled borders of wound to left plantar foot daily as needed for epithelized borders. Once A Day - PRN PRN 1  . tamsulosin (FLOMAX) 0.4 MG CAPS capsule Take 1 capsule (0.4 mg total) by mouth daily after supper.  . torsemide (DEMADEX) 20 MG tablet Take 1 tablet (20 mg total) by mouth daily.  . [DISCONTINUED] insulin aspart protamine- aspart (NOVOLOG MIX 70/30) (70-30) 100 UNIT/ML injection Inject 0.25 mLs (25 Units total) into the skin 2 (two) times daily with a meal. (Patient taking differently: Inject 28 Units into the skin 2 (two) times daily with a meal. )   No facility-administered encounter medications on file as of 03/13/2019.      SIGNIFICANT DIAGNOSTIC EXAMS   PREVIOUS;   02-23-19" chest x-ray:  1. Mild to moderate enlargement of the cardiopericardial silhouette with suspected small bilateral pleural effusions but no current Edema  02-24-19: 2-d echo:    1. The left ventricle has hyperdynamic systolic function, with an ejection fraction of >65%. The cavity size was normal. There is mildly increased left ventricular wall thickness. Possible restrictive diastolic filling pattern. No evidence of left  ventricular  regional wall motion abnormalities.  2. The right ventricle has low normal systolic function. The cavity was normal. There is no increase in right ventricular wall thickness. Right ventricular systolic pressure is severely elevated with an estimated pressure of 63.0 mmHg.  3. Left atrial size was moderately dilated.  4. The aortic valve is tricuspid. Moderate aortic annular calcification noted.  5. The mitral valve is grossly normal. There is mild mitral annular calcification present.  6. The tricuspid valve is grossly normal.  7. The aorta is normal unless otherwise noted.  8. The inferior vena cava was dilated in size with <50% respiratory variability.   02-24-19: renal ultrasound: Findings suggestive of chronic medical renal disease. Partially exophytic mid right kidney simple cyst. Small amount of bilateral perinephric stranding and left perinephric fluid.   02-27-19: chest x-ray: Enlarged cardiomediastinal silhouette with slightly worsened vascular congestion and small bilateral effusion  02-28-19: ct of abdomen and pelvis:  Mild bilateral hydroureteronephrosis is noted without obstructing ureteral calculus. Moderate urinary bladder distention is noted and this is concerning for possible bladder outlet obstruction. Small amount emphysematous changes noted in the anterior portion of the urinary bladder wall which may represent benign pneumatosis, but possible emphysematous cystitis cannot be excluded. Clinical correlation is recommended. Nonobstructive right renal calculus. 3.5 cm left adrenal adenoma. Small bilateral pleural effusions with adjacent subsegmental atelectasis. Moderate size fat containing left inguinal hernia. Aortic Atherosclerosis   03-01-19 renal ultrasound: No sonographic evidence of hydronephrosis in the bilateral kidneys.   03-01-19: bilateral ABI: Right ABI:  1.45 Left ABI:  1.43 Resting ABI of the bilateral lower extremities borderline elevated,with the waveforms  indicating no significant arterial occlusive disease.  NO NEW EXAMS.    LABS REVIEWED PREVIOUS;   02-23-19: wbc 9.4; hgb 8.8; hct 28.2; mcv 84.9; plt 219; glucose 70; bun 43; creat 2.86; k+ 4.1; na++ 13; ca 8.6; direct bili: 0.3; albumin 2.9; hgb a1c 7.1; BNP 201.0 02-24-19: hgb a1c 7.1; iron 41; tibc 347 02-26-19: wbc 9.3; hgb 7.6; hct 24.7; mcv 88.2 plt 193; glucose 118; bun 57; creat 3.69; k+ 4.4; na++ 136; ca 7.9; phos 5.3; albumin 2.7 chol 95; ldl 46; trig 113; hdl 26; vit B 12: 1273 folate 19.5 02-28-19: urine culture: klebsiella pneumoniae  03-04-19:  glucose 122; bun 60; creat 3.35; k+ 3.7; na++ 138; ca 8.2; phos 3.8; albumin 2.3 03-05-19: wbc 10.2; hgb 9.0; hct 28.8; mcv 84.2; plt 256; glucose 123; bun 54; creat 3.14 k+ 3.7; na++ 135; ca 8.4; phos 3.9; albumin 2.5   TODAY;   03-13-19: wbc 11.0; hgb 9.3; hct 29.3; mcv 84.2; plt 307; glucose 284; bun 44; creat 3.07; k+ 3.9; na++ 134; ca 8.6; phos 4.4 ;mag 1.7; liver normal albumin 2.4; hgb a1c 7.4; pth 36.0    Review of Systems  Constitutional: Negative for malaise/fatigue.  Respiratory: Negative for cough and shortness of breath.   Cardiovascular: Negative for chest pain, palpitations and leg swelling.  Gastrointestinal: Negative for abdominal pain, constipation and heartburn.  Musculoskeletal: Negative for back pain, joint pain and myalgias.  Skin:       Has sores   Neurological: Negative for dizziness.  Psychiatric/Behavioral: The patient is not nervous/anxious.    .   Physical Exam Constitutional:      General: He is not in acute distress.    Appearance: He is morbidly obese. He is not diaphoretic.  Neck:     Musculoskeletal: Neck supple.     Thyroid: No thyromegaly.  Cardiovascular:     Rate and Rhythm: Normal rate and regular rhythm.     Heart sounds: Normal heart sounds.     Comments: Pedal pulses very faint  Pulmonary:     Effort: Pulmonary effort is normal. No respiratory distress.     Breath sounds: Normal breath  sounds.  Abdominal:     General: Bowel sounds are normal. There is no distension.     Palpations: Abdomen is soft.     Tenderness: There is no abdominal tenderness.  Genitourinary:    Comments: Foley  Musculoskeletal: Normal range  of motion.     Right lower leg: Edema present.     Left lower leg: Edema present.  Lymphadenopathy:     Cervical: No cervical adenopathy.  Skin:    General: Skin is warm and dry.     Comments: Has yeast rash in both groins  Bilateral lower extremities red and hard to touch  Diabetic ulcerations;  Bottom left foot: 1.5 x 1.5 x 0.3 cm Right ankle: 2.5 x 3.0 cm    Neurological:     Mental Status: He is alert and oriented to person, place, and time.  Psychiatric:        Mood and Affect: Mood normal.      ASSESSMENT/ PLAN:  TODAY   1. Controlled type 2 diabetes mellitus with stage 3 chronic kidney disease with long term current use of insulin: cbgs remain elevated: hgb a1c 7.4 will change to novolog mix 70/30: 30 units twice daily   2. Hypertension associated with stage 3 chronic kidney disease due to type 2 diabetes mellitus: is stable b/p 114/60 will continue norvasc 10 mg daily   3. Acute on chronic diastolic congestive heart failure: is table EF >65% (02-24-19) will continue demadex 20 mg daily   PREVIOUS   4. Dyslipidemia associated with type 2 diabetes mellitus: is stable LDL 46 will continue pravachol 80 mg daily   5. Psoriasis: is stable will continue brodulumab 210 mg injection every 14 days; folic acid 1 mg daily   6. Acute urine retention: is without change will remove foley for a voiding trial will continue flomax 0.4 mg daily   7. Chronic gout due ot renal impairment of multiple sites without tophus: is stable no recent flares will continue allopurinol 300 mg daily  8.  Klebsiella cystitis/emphysematous cystitis: is stable has foley in place due to acute urinary retention will complete keflex and will monitor his status. He has failed  foley removal    MD is aware of resident's narcotic use and is in agreement with current plan of care. We will attempt to wean resident as appropriate.  Ok Edwards NP [facility]  Contact [phone] Monday through Friday 8am- 5pm  After hours call [phone]

## 2019-03-14 LAB — PTH, INTACT AND CALCIUM
Calcium, Total (PTH): 8.8 mg/dL (ref 8.6–10.2)
PTH: 36 pg/mL (ref 15–65)

## 2019-03-14 LAB — HEMOGLOBIN A1C
Hgb A1c MFr Bld: 7.4 % — ABNORMAL HIGH (ref 4.8–5.6)
Mean Plasma Glucose: 166 mg/dL

## 2019-03-15 DIAGNOSIS — L97309 Non-pressure chronic ulcer of unspecified ankle with unspecified severity: Secondary | ICD-10-CM | POA: Insufficient documentation

## 2019-03-15 DIAGNOSIS — E11622 Type 2 diabetes mellitus with other skin ulcer: Secondary | ICD-10-CM | POA: Insufficient documentation

## 2019-03-15 DIAGNOSIS — E11621 Type 2 diabetes mellitus with foot ulcer: Secondary | ICD-10-CM | POA: Insufficient documentation

## 2019-03-16 DIAGNOSIS — B372 Candidiasis of skin and nail: Secondary | ICD-10-CM | POA: Insufficient documentation

## 2019-03-18 ENCOUNTER — Non-Acute Institutional Stay (SKILLED_NURSING_FACILITY): Payer: Medicare HMO | Admitting: Adult Health

## 2019-03-18 ENCOUNTER — Encounter: Payer: Self-pay | Admitting: Adult Health

## 2019-03-18 DIAGNOSIS — Z794 Long term (current) use of insulin: Secondary | ICD-10-CM | POA: Diagnosis not present

## 2019-03-18 DIAGNOSIS — N183 Chronic kidney disease, stage 3 unspecified: Secondary | ICD-10-CM

## 2019-03-18 DIAGNOSIS — E1122 Type 2 diabetes mellitus with diabetic chronic kidney disease: Secondary | ICD-10-CM

## 2019-03-18 NOTE — Progress Notes (Signed)
[facility] Room Number: 133/P Place of Service:  SNF (31)   CODE STATUS: Full Code  Allergies  Allergen Reactions  . Dust Mite Extract Itching and Other (See Comments)    Unknown reaction-potential shortness of breath  . Prednisone Nausea And Vomiting  . Rocephin [Ceftriaxone] Nausea And Vomiting    Chief Complaint  Patient presents with  . Acute Visit    DM    HPI:  His cbgs remain elevated. He has had his insulin increased with little improvement of his cbgs. He denies any increase in his appetite; no increased thirst and no increased urination.    Past Medical History:  Diagnosis Date  . Arthritis   . Diabetes mellitus without complication ([place])   . Foot ulcer ([place])   . Hypertension     Past Surgical History:  Procedure Laterality Date  . ANKLE SURGERY    . CHOLECYSTECTOMY    . FOOT SURGERY      Social History   Socioeconomic History  . Marital status: Divorced    Spouse name: Not on file  . Number of children: Not on file  . Years of education: Not on file  . Highest education level: Not on file  Occupational History  . Not on file  Social Needs  . Financial resource strain: Not on file  . Food insecurity    Worry: Not on file    Inability: Not on file  . Transportation needs    Medical: Not on file    Non-medical: Not on file  Tobacco Use  . Smoking status: Never Smoker  . Smokeless tobacco: Never Used  Substance and Sexual Activity  . Alcohol use: No  . Drug use: No  . Sexual activity: Not on file  Lifestyle  . Physical activity    Days per week: Not on file    Minutes per session: Not on file  . Stress: Not on file  Relationships  . Social Herbalist on phone: Not on file    Gets together: Not on file    Attends religious service: Not on file    Active member of club or organization: Not on file    Attends meetings of clubs or organizations: Not on file    Relationship status: Not on file  . Intimate  partner violence    Fear of current or ex partner: Not on file    Emotionally abused: Not on file    Physically abused: Not on file    Forced sexual activity: Not on file  Other Topics Concern  . Not on file  Social History Narrative  . Not on file   No family history on file.    VITAL SIGNS BP 128/74   Pulse 72   Temp (!) 97.1 F (36.2 C) (Oral)   Resp 18   Ht 6' (1.829 m)   Wt 289 lb 8 oz (131.3 kg)   SpO2 98%   BMI 39.26 kg/m   Outpatient Encounter Medications as of 03/18/2019  Medication Sig  . acetaminophen (TYLENOL) 325 MG tablet Take 650 mg by mouth as needed (For Pain).  Marland Kitchen allopurinol (ZYLOPRIM) 300 MG tablet Take 1 tablet by mouth daily.  . Amino Acids-Protein Hydrolys (FEEDING SUPPLEMENT, PRO-STAT SUGAR FREE 64,) LIQD Take 30 mLs by mouth 2 (two) times daily between meals.  Marland Kitchen amLODipine (NORVASC) 10 MG tablet Take 10 mg by mouth at bedtime.  . Brodalumab 210 MG/1.5ML SOSY Inject 210  mg into the skin every 14 (fourteen) days.  . collagenase (SANTYL) ointment Ointment; 250 unit/gram; topical  Special Instructions: Apply to wounds per treatment orders.  . Colloidal Oatmeal (ECZEMA MOISTURIZING EX) Apply 1 application topically daily as needed (applied to legs).  . fluconazole (DIFLUCAN) 50 MG tablet Take 50 mg by mouth daily. For yeast infection  . folic acid (FOLVITE) 1 MG tablet Take 1 tablet (1 mg total) by mouth daily.  . insulin aspart protamine - aspart (NOVOLOG MIX 70/30 FLEXPEN) (70-30) 100 UNIT/ML FlexPen Inject 30 Units into the skin 2 (two) times daily with a meal.   . [START ON 03/19/2019] linagliptin (TRADJENTA) 5 MG TABS tablet Take 5 mg by mouth daily.  . Melatonin 1 MG CAPS Take 1 mg by mouth as needed. For insomnia  . metoprolol succinate (TOPROL-XL) 100 MG 24 hr tablet Take 100 mg by mouth every evening.   . NON FORMULARY Diet: Regular, NAS, Consistent Carbohydrate  . pravastatin (PRAVACHOL) 80 MG tablet Take 80 mg by mouth every evening.   .  senna-docusate (SENOKOT-S) 8.6-50 MG tablet Take 2 tablets by mouth at bedtime as needed for moderate constipation.  . silver nitrate applicators 78-29 % applicator Topical  Special Instructions: Apply to rolled borders of wound to right lateral malleolus daily as needed for epithelized borders. Once A Day - PRN  . silver nitrate applicators 56-21 % applicator ; topical  Special Instructions: Apply to rolled borders of wound to left plantar foot daily as needed for epithelized borders. Once A Day - PRN PRN 1  . tamsulosin (FLOMAX) 0.4 MG CAPS capsule Take 1 capsule (0.4 mg total) by mouth daily after supper.  . torsemide (DEMADEX) 20 MG tablet Take 1 tablet (20 mg total) by mouth daily.  . [DISCONTINUED] cephALEXin (KEFLEX) 500 MG capsule Take 1 capsule (500 mg total) by mouth every 8 (eight) hours. X 10 days   No facility-administered encounter medications on file as of 03/18/2019.      SIGNIFICANT DIAGNOSTIC EXAMS   PREVIOUS;   02-23-19" chest x-ray:  1. Mild to moderate enlargement of the cardiopericardial silhouette with suspected small bilateral pleural effusions but no current Edema  02-24-19: 2-d echo:    1. The left ventricle has hyperdynamic systolic function, with an ejection fraction of >65%. The cavity size was normal. There is mildly increased left ventricular wall thickness. Possible restrictive diastolic filling pattern. No evidence of left  ventricular regional wall motion abnormalities.  2. The right ventricle has low normal systolic function. The cavity was normal. There is no increase in right ventricular wall thickness. Right ventricular systolic pressure is severely elevated with an estimated pressure of 63.0 mmHg.  3. Left atrial size was moderately dilated.  4. The aortic valve is tricuspid. Moderate aortic annular calcification noted.  5. The mitral valve is grossly normal. There is mild mitral annular calcification present.  6. The tricuspid valve is grossly  normal.  7. The aorta is normal unless otherwise noted.  8. The inferior vena cava was dilated in size with <50% respiratory variability.   02-24-19: renal ultrasound: Findings suggestive of chronic medical renal disease. Partially exophytic mid right kidney simple cyst. Small amount of bilateral perinephric stranding and left perinephric fluid.   02-27-19: chest x-ray: Enlarged cardiomediastinal silhouette with slightly worsened vascular congestion and small bilateral effusion  02-28-19: ct of abdomen and pelvis:  Mild bilateral hydroureteronephrosis is noted without obstructing ureteral calculus. Moderate urinary bladder distention is noted and this is concerning for  possible bladder outlet obstruction. Small amount emphysematous changes noted in the anterior portion of the urinary bladder wall which may represent benign pneumatosis, but possible emphysematous cystitis cannot be excluded. Clinical correlation is recommended. Nonobstructive right renal calculus. 3.5 cm left adrenal adenoma. Small bilateral pleural effusions with adjacent subsegmental atelectasis. Moderate size fat containing left inguinal hernia. Aortic Atherosclerosis   03-01-19 renal ultrasound: No sonographic evidence of hydronephrosis in the bilateral kidneys.   03-01-19: bilateral ABI: Right ABI:  1.45 Left ABI:  1.43 Resting ABI of the bilateral lower extremities borderline elevated,with the waveforms indicating no significant arterial occlusive disease.  NO NEW EXAMS.    LABS REVIEWED PREVIOUS;   02-23-19: wbc 9.4; hgb 8.8; hct 28.2; mcv 84.9; plt 219; glucose 70; bun 43; creat 2.86; k+ 4.1; na++ 13; ca 8.6; direct bili: 0.3; albumin 2.9; hgb a1c 7.1; BNP 201.0 02-24-19: hgb a1c 7.1; iron 41; tibc 347 02-26-19: wbc 9.3; hgb 7.6; hct 24.7; mcv 88.2 plt 193; glucose 118; bun 57; creat 3.69; k+ 4.4; na++ 136; ca 7.9; phos 5.3; albumin 2.7 chol 95; ldl 46; trig 113; hdl 26; vit B 12: 1273 folate 19.5 02-28-19: urine culture:  klebsiella pneumoniae  03-04-19:  glucose 122; bun 60; creat 3.35; k+ 3.7; na++ 138; ca 8.2; phos 3.8; albumin 2.3 03-05-19: wbc 10.2; hgb 9.0; hct 28.8; mcv 84.2; plt 256; glucose 123; bun 54; creat 3.14 k+ 3.7; na++ 135; ca 8.4; phos 3.9; albumin 2.5  03-13-19: wbc 11.0; hgb 9.3; hct 29.3; mcv 84.2; plt 307; glucose 284; bun 44; creat 3.07; k+ 3.9; na++ 134; ca 8.6; phos 4.4 ;mag 1.7; liver normal albumin 2.4; hgb a1c 7.4; pth 36.0   NO NEW LABS.    Review of Systems  Constitutional: Negative for malaise/fatigue.  Respiratory: Negative for cough and shortness of breath.   Cardiovascular: Negative for chest pain, palpitations and leg swelling.  Gastrointestinal: Negative for abdominal pain, constipation and heartburn.  Musculoskeletal: Negative for back pain, joint pain and myalgias.  Skin:       Sores   Neurological: Negative for dizziness.  Psychiatric/Behavioral: The patient is not nervous/anxious.     Physical Exam Constitutional:      General: He is not in acute distress.    Appearance: He is well-developed. He is morbidly obese. He is not diaphoretic.  Neck:     Musculoskeletal: Neck supple.     Thyroid: No thyromegaly.  Cardiovascular:     Rate and Rhythm: Normal rate and regular rhythm.     Heart sounds: Normal heart sounds.     Comments: Pedal pulses very faint  Pulmonary:     Effort: Pulmonary effort is normal. No respiratory distress.     Breath sounds: Normal breath sounds.  Abdominal:     General: Bowel sounds are normal. There is no distension.     Palpations: Abdomen is soft.     Tenderness: There is no abdominal tenderness.  Genitourinary:    Comments: Foley  Musculoskeletal: Normal range of motion.     Right lower leg: Edema present.     Left lower leg: Edema present.  Lymphadenopathy:     Cervical: No cervical adenopathy.  Skin:    General: Skin is warm and dry.     Comments: Bilateral lower extremities red and hard to touch  Diabetic ulcerations;   Bottom left foot: 1.5 x 1.5 x 0.3 cm Right ankle: 2.5 x 3.0 cm     Neurological:     Mental Status: He is  alert and oriented to person, place, and time.  Psychiatric:        Mood and Affect: Mood normal.       ASSESSMENT/ PLAN:  TODAY   1. Controlled type 2 diabetes mellitus with stage 3 chronic kidney disease with long term current use of insulin: cbgs remain elevated hgb a1c 7.4 will continue novolog mix 30 units twice daily; will begin tradjenta 5 mg daily    MD is aware of resident's narcotic use and is in agreement with current plan of care. We will attempt to wean resident as appropriate.  Ok Edwards NP [facility]  Contact [phone] Monday through Friday 8am- 5pm  After hours call [phone]

## 2019-03-18 NOTE — Progress Notes (Signed)
: Provider:  Hennie Duos., MD Location:  [place] Room Number: 133-P Place of Service:  SNF ([phone])  PCP: Celene Squibb, MD Patient Care Team: Celene Squibb, MD as PCP - General (Internal Medicine)  Extended Emergency Contact Information Primary Emergency Contact: Areta Haber [place] Phone: [phone] Mobile Phone: [phone] Relation: Sister Secondary Emergency Contact: Billey Gosling Mobile Phone: [phone] Relation: Niece     Allergies: Dust mite extract, Prednisone, and Rocephin [ceftriaxone]  Chief Complaint  Patient presents with   New Admit To SNF    New admission to [facility]    HPI: Patient is a 63 y.o. male with hypertension, diabetes mellitus type 2, CKD stage III, psoriasis, venous stasis dermatitis, lymphedema who presented with 2-week history of dyspnea on exertion and lower extremities edema as well as increasing abdominal girth.  He also describes some orthopnea but denied any chest discomfort fever chills coughing hemoptysis nausea vomiting diarrhea abdominal pain.  He endorses a 20 pound weight gain over the past month, supposed to be taking Lasix 40 mg daily but only takes 20 mg daily.  Patient was then was admitted to [facility] from 8/30-9/9 for acute diastolic congestive heart failure and anasarca and acute on chronic renal failure.  Patient is congestive heart failure was treated with -14.7 L during admission but still had clinically fluid overloaded on discharge.  Acute on chronic renal failure patient's baseline prior had been 1.8-1.9 but patient  discharged creatinine is 3.14, so this may be his new baseline; he will need to follow-up with urology..  Urine culture grew out Klebsiella and patient was treated for 2 weeks with Keflex.  Patient is admitted to skilled nursing facility for OT/PT while at skilled nursing facility patient will be followed for hypertension treated with  metoprolol, Demadex K[place]ina finish this chart and then we can go will be long and Norvasc, diabetes treated with 70/30 insulin, and hyperlipidemia treated with protocol.  Past Medical History:  Diagnosis Date   Arthritis    Diabetes mellitus without complication ([place])    Foot ulcer ([place])    Hypertension     Past Surgical History:  Procedure Laterality Date   ANKLE SURGERY     CHOLECYSTECTOMY     FOOT SURGERY      Allergies as of 03/19/2019      Reactions   Dust Mite Extract Itching, Other (See Comments)   Unknown reaction-potential shortness of breath   Prednisone Nausea And Vomiting   Rocephin [ceftriaxone] Nausea And Vomiting      Medication List    Notice   This visit is during an admission. Changes to the med list made in this visit will be reflected in the After Visit Summary of the admission.     No orders of the defined types were placed in this encounter.   Immunization History  Administered Date(s) Administered   Influenza, Seasonal, Injecte, Preservative Fre 06/08/2014, 03/22/2015, 02/05/2016   Influenza,inj,Quad PF,6+ Mos 07/13/2017, 03/27/2018   Influenza,inj,quad, With Preservative 07/13/2017   Influenza-Unspecified 05/13/2012   PPD Test 08/21/2016   Pneumococcal Polysaccharide-23 10/20/2013   Tdap 10/01/2012    Social History   Tobacco Use   Smoking status: Never Smoker   Smokeless tobacco: Never Used  Substance Use Topics   Alcohol use: No    Family history is   History reviewed. No pertinent family history.    Review of Systems  DATA OBTAINED: from patient, nurse GENERAL:  no fevers, fatigue, appetite changes SKIN: No itching, or rash EYES: No eye pain, redness, discharge EARS: No earache, tinnitus, change in hearing NOSE: No congestion, drainage or bleeding  MOUTH/THROAT: No mouth or tooth pain, No sore throat RESPIRATORY: No cough, wheezing, SOB CARDIAC: No chest pain, palpitations, lower extremity edema  GI: No  abdominal pain, No N/V/D or constipation, No heartburn or reflux  GU: No dysuria, frequency or urgency, or incontinence  MUSCULOSKELETAL: No unrelieved bone/joint pain NEUROLOGIC: No headache, dizziness or focal weakness PSYCHIATRIC: No c/o anxiety or sadness   Vitals:   03/19/19 1038  BP: (!) 141/77  Pulse: 88  Resp: 18  Temp: 98.5 F (36.9 C)  SpO2: 98%    SpO2 Readings from Last 1 Encounters:  03/20/19 98%   Body mass index is 39.26 kg/m.     Physical Exam  GENERAL APPEARANCE: Alert, conversant,  No acute distress.  SKIN: No diaphoresis rash HEAD: Normocephalic, atraumatic  EYES: Conjunctiva/lids clear. Pupils round, reactive. EOMs intact.  EARS: External exam WNL, canals clear. Hearing grossly normal.  NOSE: No deformity or discharge.  MOUTH/THROAT: Lips w/o lesions  RESPIRATORY: Breathing is even, unlabored. Lung sounds are clear   CARDIOVASCULAR: Heart RRR no murmurs, rubs or gallops.  peripheral edema.   GASTROINTESTINAL: Abdomen is soft, non-tender, not distended w/ normal bowel sounds. GENITOURINARY: Bladder non tender, not distended  MUSCULOSKELETAL: No abnormal joints or musculature NEUROLOGIC:  Cranial nerves 2-12 grossly intact. Moves all extremities  PSYCHIATRIC: Mood and affect appropriate to situation, no behavioral issues  Patient Active Problem List   Diagnosis Date Noted   Yeast infection of the skin 03/16/2019   Diabetic ulcer of left foot ([place]) 03/15/2019   Diabetic ulcer of right ankle ([place]) 03/15/2019   Hypertension associated with stage 3 chronic kidney disease due to type 2 diabetes mellitus ([place]) 03/09/2019   Controlled type 2 diabetes mellitus with stage 3 chronic kidney disease, with long-term current use of insulin ([place]) 03/09/2019   Dyslipidemia associated with type 2 diabetes mellitus ([place]) 03/09/2019   Chronic gout due to renal impairment without tophus 03/09/2019   Emphysematous cystitis 03/05/2019   Bilateral hydronephrosis     Bilateral cellulitis of lower leg 03/03/2019   Acute urinary retention 03/03/2019   Klebsiella Cystitis 03/03/2019   UTI due to Klebsiella species 03/03/2019   Plantar ulcer of left foot ([place]) 03/03/2019   Acute on chronic diastolic (congestive) heart failure ([place]) 02/25/2019   Acute respiratory failure with hypoxia ([place]) 02/25/2019   Acute renal failure superimposed on stage 3 chronic kidney disease ([place]) 02/24/2019   Acute CHF (congestive heart failure) ([place]) 02/24/2019   Dyspnea 02/23/2019   Essential hypertension 02/23/2019   Diabetes mellitus ([place]) 02/23/2019   CKD (chronic kidney disease) 02/23/2019   Psoriasis 02/23/2019      Labs reviewed: Basic Metabolic Panel:    Component Value Date/Time   NA 134 (L) 03/20/2019 0700   K 4.1 03/20/2019 0700   CL 101 03/20/2019 0700   CO2 25 03/20/2019 0700   GLUCOSE 178 (H) 03/20/2019 0700   BUN 58 (H) 03/20/2019 0700   CREATININE 3.19 (H) 03/20/2019 0700   CALCIUM 8.8 (L) 03/20/2019 0700   CALCIUM 8.8 03/13/2019 0700   PROT 6.9 03/13/2019 0700   ALBUMIN 2.7 (L) 03/20/2019 0700   AST 28 03/13/2019 0700   ALT 37 03/13/2019 0700   ALKPHOS 125 03/13/2019 0700   BILITOT 0.3 03/13/2019 0700   GFRNONAA 19 (L) 03/20/2019 0700   GFRAA 22 (L)  03/20/2019 0700    Recent Labs    03/04/19 0454 03/05/19 0522 03/13/19 0700 03/20/19 0700  NA 138 135 135   134* 134*  K 3.7 3.7 3.9   3.9 4.1  CL 105 102 102   102 101  CO2 25 24 24   23 25   GLUCOSE 122* 123* 283*   284* 178*  BUN 60* 54* 43*   44* 58*  CREATININE 3.35* 3.14* 3.02*   3.07* 3.19*  CALCIUM 8.2* 8.4* 8.6*   8.6*   8.8 8.8*  MG 2.0  --  1.7  --   PHOS 3.8 3.9 4.4 4.7*   Liver Function Tests: Recent Labs    02/23/19 1714  03/05/19 0522 03/13/19 0700 03/20/19 0700  AST 20  --   --  28  --   ALT 28  --   --  37  --   ALKPHOS 116  --   --  125  --   BILITOT 1.2  --   --  0.3  --   PROT 7.1  --   --  6.9  --   ALBUMIN 2.9*   < > 2.5* 2.5*   2.5*  2.7*   < > = values in this interval not displayed.   No results for input(s): LIPASE, AMYLASE in the last 8760 hours. No results for input(s): AMMONIA in the last 8760 hours. CBC: Recent Labs    02/23/19 1440  03/04/19 0454  03/13/19 0700 03/20/19 0700 03/20/19 1800  WBC 9.4   < > 12.8*   < > 11.0* 9.2 7.8  NEUTROABS 7.3  --  10.5*  --   --   --   --   HGB 8.8*   < > 8.2*   < > 9.3* 9.3* 9.9*  HCT 28.2*   < > 25.4*   < > 29.3* 28.8* 31.7*  MCV 84.9   < > 83.8   < > 84.2 85.0 85.0  PLT 219   < > 221   < > 307 261 287   < > = values in this interval not displayed.   Lipid Recent Labs    02/26/19 0418  CHOL 95  HDL 26*  LDLCALC 46  TRIG 113    Cardiac Enzymes: No results for input(s): CKTOTAL, CKMB, CKMBINDEX, TROPONINI in the last 8760 hours. BNP: Recent Labs    02/23/19 1440  BNP 201.0*   Lab Results  Component Value Date   MICROALBUR 1,037.4 (H) 12/26/2017   Lab Results  Component Value Date   HGBA1C 7.4 (H) 03/13/2019   No results found for: TSH Lab Results  Component Value Date   VITAMINB12 1,273 (H) 02/26/2019   Lab Results  Component Value Date   FOLATE 19.5 02/26/2019   Lab Results  Component Value Date   IRON 41 (L) 02/24/2019   TIBC 347 02/24/2019   FERRITIN 158 02/24/2019    Imaging and Procedures obtained prior to SNF admission: No results found.   Not all labs, radiology exams or other studies done during hospitalization come through on my EPIC note; however they are reviewed by me.    Assessment and Plan  Acute respiratory failure with hypoxia/acute diastolic congestive heart failure/anasarca-in part due to progression of underlying CKD; patient was treated with Lasix IV 40 mg twice daily with a diuresis of 14.7 L; echo showed ejection fraction greater than 65% on discharge patient remains clinically fluid overloaded SNF-admitted for OT/PT; continue Demadex 20 mg daily; follow  renal functions  Acute on chronic renal failure- has  had progression of underlying kidney disease with baseline creatinine of 1.8-1.9 changing probably to 3.14 which was what creatinine was on day of discharge SNF-follow-up BMP and follow-up with nephrology in 2 weeks  Emphysematous cystitis/BSO- CT findings concerning for bladder outlet obstruction and mild bilateral ureteral neph cirrhosis; Foley placed on 9/4 with massive diuresis; urine culture "" Klebsiella and patient to continue antibiotics for total of 2 weeks SNF- continue Keflex 500 mg every 8 hours for 10 more days; follow-up with urology in 2 weeks  Hypertension SNF- appears controlled; continue Norvasc 10 mg daily and metoprolol 100 mg nightly and Demadex 20 mg daily  Diabetes mellitus type 2-hemoglobin A1c 7.1 SNF- continue 7030 25 units 2 times daily with meals; will follow blood sugars 3 times daily; patient is not on ACE/arm secondary to progressing renal failure  Hyperlipidemia SNF not stated as uncontrolled; continue Pravachol 80 mg daily  Hypertension hyperlipidemia Time spent greater than 45 minutes;> 50% of time with patient was spent reviewing records, labs, tests and studies, counseling and developing plan of care  Hennie Duos, MD

## 2019-03-19 ENCOUNTER — Non-Acute Institutional Stay (SKILLED_NURSING_FACILITY): Payer: Medicare HMO | Admitting: Internal Medicine

## 2019-03-19 DIAGNOSIS — I129 Hypertensive chronic kidney disease with stage 1 through stage 4 chronic kidney disease, or unspecified chronic kidney disease: Secondary | ICD-10-CM | POA: Diagnosis not present

## 2019-03-19 DIAGNOSIS — I5032 Chronic diastolic (congestive) heart failure: Secondary | ICD-10-CM | POA: Diagnosis not present

## 2019-03-19 DIAGNOSIS — E1122 Type 2 diabetes mellitus with diabetic chronic kidney disease: Secondary | ICD-10-CM

## 2019-03-19 DIAGNOSIS — J9601 Acute respiratory failure with hypoxia: Secondary | ICD-10-CM | POA: Diagnosis not present

## 2019-03-19 DIAGNOSIS — N320 Bladder-neck obstruction: Secondary | ICD-10-CM

## 2019-03-19 DIAGNOSIS — I5033 Acute on chronic diastolic (congestive) heart failure: Secondary | ICD-10-CM

## 2019-03-19 DIAGNOSIS — N2581 Secondary hyperparathyroidism of renal origin: Secondary | ICD-10-CM | POA: Diagnosis not present

## 2019-03-19 DIAGNOSIS — N3090 Cystitis, unspecified without hematuria: Secondary | ICD-10-CM

## 2019-03-19 DIAGNOSIS — N1330 Unspecified hydronephrosis: Secondary | ICD-10-CM | POA: Diagnosis not present

## 2019-03-19 DIAGNOSIS — R601 Generalized edema: Secondary | ICD-10-CM | POA: Diagnosis not present

## 2019-03-19 DIAGNOSIS — N179 Acute kidney failure, unspecified: Secondary | ICD-10-CM

## 2019-03-19 DIAGNOSIS — N183 Chronic kidney disease, stage 3 unspecified: Secondary | ICD-10-CM

## 2019-03-19 DIAGNOSIS — N184 Chronic kidney disease, stage 4 (severe): Secondary | ICD-10-CM | POA: Diagnosis not present

## 2019-03-19 DIAGNOSIS — N3080 Other cystitis without hematuria: Secondary | ICD-10-CM | POA: Diagnosis not present

## 2019-03-19 DIAGNOSIS — E785 Hyperlipidemia, unspecified: Secondary | ICD-10-CM

## 2019-03-19 DIAGNOSIS — E1169 Type 2 diabetes mellitus with other specified complication: Secondary | ICD-10-CM

## 2019-03-19 DIAGNOSIS — D509 Iron deficiency anemia, unspecified: Secondary | ICD-10-CM | POA: Diagnosis not present

## 2019-03-19 DIAGNOSIS — Z794 Long term (current) use of insulin: Secondary | ICD-10-CM

## 2019-03-20 ENCOUNTER — Non-Acute Institutional Stay (SKILLED_NURSING_FACILITY): Payer: Medicare HMO | Admitting: Adult Health

## 2019-03-20 ENCOUNTER — Encounter: Payer: Self-pay | Admitting: Adult Health

## 2019-03-20 ENCOUNTER — Other Ambulatory Visit: Payer: Self-pay | Admitting: Adult Health

## 2019-03-20 ENCOUNTER — Encounter (HOSPITAL_COMMUNITY)
Admission: RE | Admit: 2019-03-20 | Discharge: 2019-03-20 | Disposition: A | Discharge status: Home / Self Care | Payer: Medicare HMO | Source: Skilled Nursing Facility | Attending: Internal Medicine | Admitting: Internal Medicine

## 2019-03-20 DIAGNOSIS — M1A39X0 Chronic gout due to renal impairment, multiple sites, without tophus (tophi): Secondary | ICD-10-CM | POA: Insufficient documentation

## 2019-03-20 DIAGNOSIS — I130 Hypertensive heart and chronic kidney disease with heart failure and stage 1 through stage 4 chronic kidney disease, or unspecified chronic kidney disease: Secondary | ICD-10-CM | POA: Insufficient documentation

## 2019-03-20 DIAGNOSIS — L97319 Non-pressure chronic ulcer of right ankle with unspecified severity: Secondary | ICD-10-CM | POA: Diagnosis not present

## 2019-03-20 DIAGNOSIS — E1122 Type 2 diabetes mellitus with diabetic chronic kidney disease: Secondary | ICD-10-CM

## 2019-03-20 DIAGNOSIS — Z794 Long term (current) use of insulin: Secondary | ICD-10-CM

## 2019-03-20 DIAGNOSIS — N183 Chronic kidney disease, stage 3 unspecified: Secondary | ICD-10-CM

## 2019-03-20 DIAGNOSIS — E11622 Type 2 diabetes mellitus with other skin ulcer: Secondary | ICD-10-CM

## 2019-03-20 DIAGNOSIS — E11621 Type 2 diabetes mellitus with foot ulcer: Secondary | ICD-10-CM | POA: Diagnosis not present

## 2019-03-20 DIAGNOSIS — I5033 Acute on chronic diastolic (congestive) heart failure: Secondary | ICD-10-CM | POA: Diagnosis not present

## 2019-03-20 DIAGNOSIS — N3080 Other cystitis without hematuria: Secondary | ICD-10-CM | POA: Insufficient documentation

## 2019-03-20 DIAGNOSIS — L97521 Non-pressure chronic ulcer of other part of left foot limited to breakdown of skin: Secondary | ICD-10-CM

## 2019-03-20 LAB — RENAL FUNCTION PANEL
Albumin: 2.7 g/dL — ABNORMAL LOW (ref 3.5–5.0)
Anion gap: 8 (ref 5–15)
BUN: 58 mg/dL — ABNORMAL HIGH (ref 8–23)
CO2: 25 mmol/L (ref 22–32)
Calcium: 8.8 mg/dL — ABNORMAL LOW (ref 8.9–10.3)
Chloride: 101 mmol/L (ref 98–111)
Creatinine, Ser: 3.19 mg/dL — ABNORMAL HIGH (ref 0.61–1.24)
GFR calc Af Amer: 22 mL/min — ABNORMAL LOW (ref >60)
GFR calc non Af Amer: 19 mL/min — ABNORMAL LOW (ref >60)
Glucose, Bld: 178 mg/dL — ABNORMAL HIGH (ref 70–99)
Phosphorus: 4.7 mg/dL — ABNORMAL HIGH (ref 2.5–4.6)
Potassium: 4.1 mmol/L (ref 3.5–5.1)
Sodium: 134 mmol/L — ABNORMAL LOW (ref 135–145)

## 2019-03-20 LAB — CBC
HCT: 28.8 % — ABNORMAL LOW (ref 39.0–52.0)
HCT: 31.7 % — ABNORMAL LOW (ref 39.0–52.0)
Hemoglobin: 9.3 g/dL — ABNORMAL LOW (ref 13.0–17.0)
Hemoglobin: 9.9 g/dL — ABNORMAL LOW (ref 13.0–17.0)
MCH: 27.4 pg (ref 26.0–34.0)
MCH: 26.5 pg (ref 26.0–34.0)
MCHC: 32.3 g/dL (ref 30.0–36.0)
MCHC: 31.2 g/dL (ref 30.0–36.0)
MCV: 85 fL (ref 80.0–100.0)
MCV: 85 fL (ref 80.0–100.0)
Platelets: 261 10*3/uL (ref 150–400)
Platelets: 287 10*3/uL (ref 150–400)
RBC: 3.39 MIL/uL — ABNORMAL LOW (ref 4.22–5.81)
RBC: 3.73 MIL/uL — ABNORMAL LOW (ref 4.22–5.81)
RDW: 17.2 % — ABNORMAL HIGH (ref 11.5–15.5)
RDW: 17.3 % — ABNORMAL HIGH (ref 11.5–15.5)
WBC: 9.2 10*3/uL (ref 4.0–10.5)
WBC: 7.8 10*3/uL (ref 4.0–10.5)
nRBC: 0 % (ref 0.0–0.2)
nRBC: 0 % (ref 0.0–0.2)

## 2019-03-20 MED ORDER — ALLOPURINOL 300 MG PO TABS: 300.0000 mg | ORAL_TABLET | Freq: Every day | ORAL | 0 refills | Status: DC

## 2019-03-20 MED ORDER — AMLODIPINE BESYLATE 10 MG PO TABS: 10.0000 mg | ORAL_TABLET | Freq: Every day | ORAL | 0 refills | Status: DC

## 2019-03-20 MED ORDER — PRAVASTATIN SODIUM 80 MG PO TABS: 80.0000 mg | ORAL_TABLET | Freq: Every evening | ORAL | 0 refills | Status: DC

## 2019-03-20 MED ORDER — NOVOLOG MIX 70/30 FLEXPEN (70-30) 100 UNIT/ML ~~LOC~~ SUPN: 30.0000 [IU] | PEN_INJECTOR | Freq: Two times a day (BID) | SUBCUTANEOUS | 0 refills | Status: DC

## 2019-03-20 MED ORDER — TAMSULOSIN HCL 0.4 MG PO CAPS: 0.4000 mg | ORAL_CAPSULE | Freq: Every day | ORAL | 1 refills | Status: DC

## 2019-03-20 MED ORDER — FOLIC ACID 1 MG PO TABS: 1.0000 mg | ORAL_TABLET | Freq: Every day | ORAL | 0 refills | Status: DC

## 2019-03-20 MED ORDER — LINAGLIPTIN 5 MG PO TABS: 5.0000 mg | ORAL_TABLET | Freq: Every day | ORAL | 0 refills | Status: DC

## 2019-03-20 MED ORDER — METOPROLOL SUCCINATE ER 100 MG PO TB24: 100.0000 mg | ORAL_TABLET | Freq: Every evening | ORAL | 0 refills | Status: DC

## 2019-03-20 MED ORDER — TORSEMIDE 20 MG PO TABS: 20.0000 mg | ORAL_TABLET | Freq: Every day | ORAL | 0 refills | Status: DC

## 2019-03-20 NOTE — Progress Notes (Signed)
Location:    [place] Room Number: 133/P Place of Service:  SNF (31)    CODE STATUS: Full Code  Allergies  Allergen Reactions  . Dust Mite Extract Itching and Other (See Comments)    Unknown reaction-potential shortness of breath  . Prednisone Nausea And Vomiting  . Rocephin [Ceftriaxone] Nausea And Vomiting    Chief Complaint  Patient presents with  . Discharge Note    Discharge Visit,    HPI:  He is being discharged to home with home health for pt/ot/rn/cna. He does not need dme. He will need his prescriptions written and will need to follow up with his medical provider. He had been hospitalized for his chf. He was admitted to this facility for short term rehab and wound care. He participated in pt/ot. He is ready to complete his therapy on a home health basis.     Past Medical History:  Diagnosis Date  . Arthritis   . Diabetes mellitus without complication ([place])   . Foot ulcer ([place])   . Hypertension     Past Surgical History:  Procedure Laterality Date  . ANKLE SURGERY    . CHOLECYSTECTOMY    . FOOT SURGERY      Social History   Socioeconomic History  . Marital status: Divorced    Spouse name: Not on file  . Number of children: Not on file  . Years of education: Not on file  . Highest education level: Not on file  Occupational History  . Not on file  Social Needs  . Financial resource strain: Not on file  . Food insecurity    Worry: Not on file    Inability: Not on file  . Transportation needs    Medical: Not on file    Non-medical: Not on file  Tobacco Use  . Smoking status: Never Smoker  . Smokeless tobacco: Never Used  Substance and Sexual Activity  . Alcohol use: No  . Drug use: No  . Sexual activity: Not on file  Lifestyle  . Physical activity    Days per week: Not on file    Minutes per session: Not on file  . Stress: Not on file  Relationships  . Social Herbalist on phone: Not on file    Gets  together: Not on file    Attends religious service: Not on file    Active member of club or organization: Not on file    Attends meetings of clubs or organizations: Not on file    Relationship status: Not on file  . Intimate partner violence    Fear of current or ex partner: Not on file    Emotionally abused: Not on file    Physically abused: Not on file    Forced sexual activity: Not on file  Other Topics Concern  . Not on file  Social History Narrative  . Not on file   No family history on file.  VITAL SIGNS BP 129/73   Pulse 94   Temp 98.1 F (36.7 C) (Oral)   Resp 18   Ht 6' (1.829 m)   Wt 290 lb 8 oz (131.8 kg)   SpO2 98%   BMI 39.40 kg/m   Patient's Medications  New Prescriptions   No medications on file  Previous Medications   ACETAMINOPHEN (TYLENOL) 325 MG TABLET    Take 650 mg by mouth as needed for mild pain or moderate pain.    ALLOPURINOL (ZYLOPRIM)  300 MG TABLET    Take 1 tablet by mouth daily.   AMINO ACIDS-PROTEIN HYDROLYS (FEEDING SUPPLEMENT, PRO-STAT SUGAR FREE 64,) LIQD    Take 30 mLs by mouth 2 (two) times daily between meals.   AMLODIPINE (NORVASC) 10 MG TABLET    Take 10 mg by mouth at bedtime.   BRODALUMAB 210 MG/1.5ML SOSY    Inject 210 mg into the skin every 14 (fourteen) days.   COLLAGENASE (SANTYL) OINTMENT    Ointment; 250 unit/gram; topical  Special Instructions: Apply to wounds per treatment orders.   COLLOIDAL OATMEAL (ECZEMA MOISTURIZING EX)    Apply 1 application topically daily as needed (applied to legs).   FOLIC ACID (FOLVITE) 1 MG TABLET    Take 1 tablet (1 mg total) by mouth daily.   INSULIN ASPART PROTAMINE - ASPART (NOVOLOG MIX 70/30 FLEXPEN) (70-30) 100 UNIT/ML FLEXPEN    Inject 30 Units into the skin 2 (two) times daily with a meal.    LINAGLIPTIN (TRADJENTA) 5 MG TABS TABLET    Take 5 mg by mouth daily.   MELATONIN 1 MG CAPS    Take 1 mg by mouth as needed. For insomnia   METOPROLOL SUCCINATE (TOPROL-XL) 100 MG 24 HR TABLET     Take 100 mg by mouth every evening.    NON FORMULARY    Diet: Regular, NAS, Consistent Carbohydrate   PRAVASTATIN (PRAVACHOL) 80 MG TABLET    Take 80 mg by mouth every evening.    SENNA-DOCUSATE (SENOKOT-S) 8.6-50 MG TABLET    Take 2 tablets by mouth at bedtime as needed for moderate constipation.   SILVER NITRATE APPLICATORS 82-80 % APPLICATOR    Topical  Special Instructions: Apply to rolled borders of wound to right lateral malleolus daily as needed for epithelized borders. Once A Day - PRN   SILVER NITRATE APPLICATORS 03-49 % APPLICATOR    ; topical  Special Instructions: Apply to rolled borders of wound to left plantar foot daily as needed for epithelized borders. Once A Day - PRN PRN 1   TAMSULOSIN (FLOMAX) 0.4 MG CAPS CAPSULE    Take 1 capsule (0.4 mg total) by mouth daily after supper.   TORSEMIDE (DEMADEX) 20 MG TABLET    Take 1 tablet (20 mg total) by mouth daily.  Modified Medications   No medications on file  Discontinued Medications   No medications on file     SIGNIFICANT DIAGNOSTIC EXAMS   PREVIOUS;   02-23-19" chest x-ray:  1. Mild to moderate enlargement of the cardiopericardial silhouette with suspected small bilateral pleural effusions but no current Edema  02-24-19: 2-d echo:    1. The left ventricle has hyperdynamic systolic function, with an ejection fraction of >65%. The cavity size was normal. There is mildly increased left ventricular wall thickness. Possible restrictive diastolic filling pattern. No evidence of left  ventricular regional wall motion abnormalities.  2. The right ventricle has low normal systolic function. The cavity was normal. There is no increase in right ventricular wall thickness. Right ventricular systolic pressure is severely elevated with an estimated pressure of 63.0 mmHg.  3. Left atrial size was moderately dilated.  4. The aortic valve is tricuspid. Moderate aortic annular calcification noted.  5. The mitral valve is grossly normal.  There is mild mitral annular calcification present.  6. The tricuspid valve is grossly normal.  7. The aorta is normal unless otherwise noted.  8. The inferior vena cava was dilated in size with <50% respiratory variability.  02-24-19: renal ultrasound: Findings suggestive of chronic medical renal disease. Partially exophytic mid right kidney simple cyst. Small amount of bilateral perinephric stranding and left perinephric fluid.   02-27-19: chest x-ray: Enlarged cardiomediastinal silhouette with slightly worsened vascular congestion and small bilateral effusion  02-28-19: ct of abdomen and pelvis:  Mild bilateral hydroureteronephrosis is noted without obstructing ureteral calculus. Moderate urinary bladder distention is noted and this is concerning for possible bladder outlet obstruction. Small amount emphysematous changes noted in the anterior portion of the urinary bladder wall which may represent benign pneumatosis, but possible emphysematous cystitis cannot be excluded. Clinical correlation is recommended. Nonobstructive right renal calculus. 3.5 cm left adrenal adenoma. Small bilateral pleural effusions with adjacent subsegmental atelectasis. Moderate size fat containing left inguinal hernia. Aortic Atherosclerosis   03-01-19 renal ultrasound: No sonographic evidence of hydronephrosis in the bilateral kidneys.   03-01-19: bilateral ABI: Right ABI:  1.45 Left ABI:  1.43 Resting ABI of the bilateral lower extremities borderline elevated,with the waveforms indicating no significant arterial occlusive disease.  NO NEW EXAMS.    LABS REVIEWED PREVIOUS;   02-23-19: wbc 9.4; hgb 8.8; hct 28.2; mcv 84.9; plt 219; glucose 70; bun 43; creat 2.86; k+ 4.1; na++ 13; ca 8.6; direct bili: 0.3; albumin 2.9; hgb a1c 7.1; BNP 201.0 02-24-19: hgb a1c 7.1; iron 41; tibc 347 02-26-19: wbc 9.3; hgb 7.6; hct 24.7; mcv 88.2 plt 193; glucose 118; bun 57; creat 3.69; k+ 4.4; na++ 136; ca 7.9; phos 5.3; albumin 2.7  chol 95; ldl 46; trig 113; hdl 26; vit B 12: 1273 folate 19.5 02-28-19: urine culture: klebsiella pneumoniae  03-04-19:  glucose 122; bun 60; creat 3.35; k+ 3.7; na++ 138; ca 8.2; phos 3.8; albumin 2.3 03-05-19: wbc 10.2; hgb 9.0; hct 28.8; mcv 84.2; plt 256; glucose 123; bun 54; creat 3.14 k+ 3.7; na++ 135; ca 8.4; phos 3.9; albumin 2.5  03-13-19: wbc 11.0; hgb 9.3; hct 29.3; mcv 84.2; plt 307; glucose 284; bun 44; creat 3.07; k+ 3.9; na++ 134; ca 8.6; phos 4.4 ;mag 1.7; liver normal albumin 2.4; hgb a1c 7.4; pth 36.0   NO NEW LABS.    Review of Systems  Constitutional: Negative for malaise/fatigue.  Respiratory: Negative for cough and shortness of breath.   Cardiovascular: Negative for chest pain, palpitations and leg swelling.  Gastrointestinal: Negative for abdominal pain, constipation and heartburn.  Musculoskeletal: Negative for back pain, joint pain and myalgias.  Skin:       skin  Neurological: Negative for dizziness.  Psychiatric/Behavioral: The patient is not nervous/anxious.      Physical Exam Constitutional:      General: He is not in acute distress.    Appearance: He is well-developed. He is morbidly obese. He is not diaphoretic.  Neck:     Musculoskeletal: Neck supple.     Thyroid: No thyromegaly.  Cardiovascular:     Rate and Rhythm: Normal rate and regular rhythm.     Heart sounds: Normal heart sounds.     Comments: Pedal pulses very faint  Pulmonary:     Effort: Pulmonary effort is normal. No respiratory distress.     Breath sounds: Normal breath sounds.  Abdominal:     General: Bowel sounds are normal. There is no distension.     Palpations: Abdomen is soft.     Tenderness: There is no abdominal tenderness.  Genitourinary:    Comments: foley Musculoskeletal: Normal range of motion.     Right lower leg: Edema present.     Left  lower leg: Edema present.  Lymphadenopathy:     Cervical: No cervical adenopathy.  Skin:    General: Skin is warm and dry.      Comments: Bilateral lower extremities red and hard to touch  Diabetic ulcerations;  Bottom left foot: 1.5 x 1.5 x 0.3 cm Right ankle: 2.5 x 3.0 cm     Neurological:     Mental Status: He is alert and oriented to person, place, and time.  Psychiatric:        Mood and Affect: Mood normal.      ASSESSMENT/ PLAN:  Patient is being discharged with the following home health services:  Pt/ot/rn/cna: to evaluate and treat as indicated for gait balance strength adl training wound management and adl care.   Patient is being discharged with the following durable medical equipment:  None needed   Patient has been advised to f/u with their PCP in 1-2 weeks to bring them up to date on their rehab stay.  Social services at facility was responsible for arranging this appointment.  Pt was provided with a 30 day supply of prescriptions for medications and refills must be obtained from their PCP.  For controlled substances, a more limited supply may be provided adequate until PCP appointment only.  A 30 day supply of his prescription medications have been sent to: [facility] [place]  Time spent with patient: 35 minutes: home health; medications dme.    Ok Edwards NP [facility]  Contact [phone] Monday through Friday 8am- 5pm  After hours call [phone]

## 2019-03-21 ENCOUNTER — Encounter: Payer: Self-pay | Admitting: Internal Medicine

## 2019-03-23 ENCOUNTER — Encounter: Payer: Self-pay | Admitting: Internal Medicine

## 2019-03-23 DIAGNOSIS — R601 Generalized edema: Secondary | ICD-10-CM | POA: Insufficient documentation

## 2019-03-23 DIAGNOSIS — N320 Bladder-neck obstruction: Secondary | ICD-10-CM | POA: Insufficient documentation

## 2019-03-25 DIAGNOSIS — L97429 Non-pressure chronic ulcer of left heel and midfoot with unspecified severity: Secondary | ICD-10-CM | POA: Diagnosis not present

## 2019-03-25 DIAGNOSIS — I5031 Acute diastolic (congestive) heart failure: Secondary | ICD-10-CM | POA: Diagnosis not present

## 2019-03-25 DIAGNOSIS — E1151 Type 2 diabetes mellitus with diabetic peripheral angiopathy without gangrene: Secondary | ICD-10-CM | POA: Diagnosis not present

## 2019-03-25 DIAGNOSIS — N390 Urinary tract infection, site not specified: Secondary | ICD-10-CM | POA: Diagnosis not present

## 2019-03-25 DIAGNOSIS — I130 Hypertensive heart and chronic kidney disease with heart failure and stage 1 through stage 4 chronic kidney disease, or unspecified chronic kidney disease: Secondary | ICD-10-CM | POA: Diagnosis not present

## 2019-03-25 DIAGNOSIS — N183 Chronic kidney disease, stage 3 (moderate): Secondary | ICD-10-CM | POA: Diagnosis not present

## 2019-03-25 DIAGNOSIS — E11621 Type 2 diabetes mellitus with foot ulcer: Secondary | ICD-10-CM | POA: Diagnosis not present

## 2019-03-25 DIAGNOSIS — E1122 Type 2 diabetes mellitus with diabetic chronic kidney disease: Secondary | ICD-10-CM | POA: Diagnosis not present

## 2019-03-25 DIAGNOSIS — L97319 Non-pressure chronic ulcer of right ankle with unspecified severity: Secondary | ICD-10-CM | POA: Diagnosis not present

## 2019-03-25 DIAGNOSIS — E1142 Type 2 diabetes mellitus with diabetic polyneuropathy: Secondary | ICD-10-CM | POA: Diagnosis not present

## 2019-03-25 DIAGNOSIS — M1030 Gout due to renal impairment, unspecified site: Secondary | ICD-10-CM | POA: Diagnosis not present

## 2019-03-26 DIAGNOSIS — I130 Hypertensive heart and chronic kidney disease with heart failure and stage 1 through stage 4 chronic kidney disease, or unspecified chronic kidney disease: Secondary | ICD-10-CM | POA: Diagnosis not present

## 2019-03-26 DIAGNOSIS — L97429 Non-pressure chronic ulcer of left heel and midfoot with unspecified severity: Secondary | ICD-10-CM | POA: Diagnosis not present

## 2019-03-26 DIAGNOSIS — L97319 Non-pressure chronic ulcer of right ankle with unspecified severity: Secondary | ICD-10-CM | POA: Diagnosis not present

## 2019-03-26 DIAGNOSIS — E1122 Type 2 diabetes mellitus with diabetic chronic kidney disease: Secondary | ICD-10-CM | POA: Diagnosis not present

## 2019-03-26 DIAGNOSIS — I5031 Acute diastolic (congestive) heart failure: Secondary | ICD-10-CM | POA: Diagnosis not present

## 2019-03-26 DIAGNOSIS — E11621 Type 2 diabetes mellitus with foot ulcer: Secondary | ICD-10-CM | POA: Diagnosis not present

## 2019-03-26 DIAGNOSIS — N390 Urinary tract infection, site not specified: Secondary | ICD-10-CM | POA: Diagnosis not present

## 2019-03-26 DIAGNOSIS — N183 Chronic kidney disease, stage 3 (moderate): Secondary | ICD-10-CM | POA: Diagnosis not present

## 2019-03-26 DIAGNOSIS — M1030 Gout due to renal impairment, unspecified site: Secondary | ICD-10-CM | POA: Diagnosis not present

## 2019-03-27 DIAGNOSIS — N183 Chronic kidney disease, stage 3 unspecified: Secondary | ICD-10-CM | POA: Diagnosis not present

## 2019-03-27 DIAGNOSIS — L97319 Non-pressure chronic ulcer of right ankle with unspecified severity: Secondary | ICD-10-CM | POA: Diagnosis not present

## 2019-03-27 DIAGNOSIS — M1030 Gout due to renal impairment, unspecified site: Secondary | ICD-10-CM | POA: Diagnosis not present

## 2019-03-27 DIAGNOSIS — N390 Urinary tract infection, site not specified: Secondary | ICD-10-CM | POA: Diagnosis not present

## 2019-03-27 DIAGNOSIS — I130 Hypertensive heart and chronic kidney disease with heart failure and stage 1 through stage 4 chronic kidney disease, or unspecified chronic kidney disease: Secondary | ICD-10-CM | POA: Diagnosis not present

## 2019-03-27 DIAGNOSIS — E1122 Type 2 diabetes mellitus with diabetic chronic kidney disease: Secondary | ICD-10-CM | POA: Diagnosis not present

## 2019-03-27 DIAGNOSIS — E11621 Type 2 diabetes mellitus with foot ulcer: Secondary | ICD-10-CM | POA: Diagnosis not present

## 2019-03-27 DIAGNOSIS — L97429 Non-pressure chronic ulcer of left heel and midfoot with unspecified severity: Secondary | ICD-10-CM | POA: Diagnosis not present

## 2019-03-27 DIAGNOSIS — I5031 Acute diastolic (congestive) heart failure: Secondary | ICD-10-CM | POA: Diagnosis not present

## 2019-03-28 DIAGNOSIS — L89513 Pressure ulcer of right ankle, stage 3: Secondary | ICD-10-CM | POA: Diagnosis not present

## 2019-03-28 DIAGNOSIS — E8770 Fluid overload, unspecified: Secondary | ICD-10-CM | POA: Diagnosis not present

## 2019-03-28 DIAGNOSIS — N184 Chronic kidney disease, stage 4 (severe): Secondary | ICD-10-CM | POA: Diagnosis not present

## 2019-03-28 DIAGNOSIS — N320 Bladder-neck obstruction: Secondary | ICD-10-CM | POA: Diagnosis not present

## 2019-03-31 DIAGNOSIS — L97319 Non-pressure chronic ulcer of right ankle with unspecified severity: Secondary | ICD-10-CM | POA: Diagnosis not present

## 2019-03-31 DIAGNOSIS — N390 Urinary tract infection, site not specified: Secondary | ICD-10-CM | POA: Diagnosis not present

## 2019-03-31 DIAGNOSIS — N183 Chronic kidney disease, stage 3 unspecified: Secondary | ICD-10-CM | POA: Diagnosis not present

## 2019-03-31 DIAGNOSIS — E1122 Type 2 diabetes mellitus with diabetic chronic kidney disease: Secondary | ICD-10-CM | POA: Diagnosis not present

## 2019-03-31 DIAGNOSIS — I5031 Acute diastolic (congestive) heart failure: Secondary | ICD-10-CM | POA: Diagnosis not present

## 2019-03-31 DIAGNOSIS — L97429 Non-pressure chronic ulcer of left heel and midfoot with unspecified severity: Secondary | ICD-10-CM | POA: Diagnosis not present

## 2019-03-31 DIAGNOSIS — I130 Hypertensive heart and chronic kidney disease with heart failure and stage 1 through stage 4 chronic kidney disease, or unspecified chronic kidney disease: Secondary | ICD-10-CM | POA: Diagnosis not present

## 2019-03-31 DIAGNOSIS — E11621 Type 2 diabetes mellitus with foot ulcer: Secondary | ICD-10-CM | POA: Diagnosis not present

## 2019-03-31 DIAGNOSIS — M1030 Gout due to renal impairment, unspecified site: Secondary | ICD-10-CM | POA: Diagnosis not present

## 2019-04-01 ENCOUNTER — Ambulatory Visit (INDEPENDENT_AMBULATORY_CARE_PROVIDER_SITE_OTHER): Payer: Medicare HMO | Admitting: Urology

## 2019-04-01 DIAGNOSIS — N401 Enlarged prostate with lower urinary tract symptoms: Secondary | ICD-10-CM | POA: Diagnosis not present

## 2019-04-01 DIAGNOSIS — R358 Other polyuria: Secondary | ICD-10-CM

## 2019-04-02 ENCOUNTER — Ambulatory Visit (INDEPENDENT_AMBULATORY_CARE_PROVIDER_SITE_OTHER): Payer: Medicare HMO | Admitting: Urology

## 2019-04-02 DIAGNOSIS — E1122 Type 2 diabetes mellitus with diabetic chronic kidney disease: Secondary | ICD-10-CM | POA: Diagnosis not present

## 2019-04-02 DIAGNOSIS — M1030 Gout due to renal impairment, unspecified site: Secondary | ICD-10-CM | POA: Diagnosis not present

## 2019-04-02 DIAGNOSIS — N183 Chronic kidney disease, stage 3 unspecified: Secondary | ICD-10-CM | POA: Diagnosis not present

## 2019-04-02 DIAGNOSIS — R338 Other retention of urine: Secondary | ICD-10-CM | POA: Diagnosis not present

## 2019-04-02 DIAGNOSIS — E11621 Type 2 diabetes mellitus with foot ulcer: Secondary | ICD-10-CM | POA: Diagnosis not present

## 2019-04-02 DIAGNOSIS — I130 Hypertensive heart and chronic kidney disease with heart failure and stage 1 through stage 4 chronic kidney disease, or unspecified chronic kidney disease: Secondary | ICD-10-CM | POA: Diagnosis not present

## 2019-04-02 DIAGNOSIS — L97319 Non-pressure chronic ulcer of right ankle with unspecified severity: Secondary | ICD-10-CM | POA: Diagnosis not present

## 2019-04-02 DIAGNOSIS — N390 Urinary tract infection, site not specified: Secondary | ICD-10-CM | POA: Diagnosis not present

## 2019-04-02 DIAGNOSIS — L97429 Non-pressure chronic ulcer of left heel and midfoot with unspecified severity: Secondary | ICD-10-CM | POA: Diagnosis not present

## 2019-04-02 DIAGNOSIS — I5031 Acute diastolic (congestive) heart failure: Secondary | ICD-10-CM | POA: Diagnosis not present

## 2019-04-03 DIAGNOSIS — E11621 Type 2 diabetes mellitus with foot ulcer: Secondary | ICD-10-CM | POA: Diagnosis not present

## 2019-04-03 DIAGNOSIS — E1122 Type 2 diabetes mellitus with diabetic chronic kidney disease: Secondary | ICD-10-CM | POA: Diagnosis not present

## 2019-04-03 DIAGNOSIS — N390 Urinary tract infection, site not specified: Secondary | ICD-10-CM | POA: Diagnosis not present

## 2019-04-03 DIAGNOSIS — N183 Chronic kidney disease, stage 3 unspecified: Secondary | ICD-10-CM | POA: Diagnosis not present

## 2019-04-03 DIAGNOSIS — L97319 Non-pressure chronic ulcer of right ankle with unspecified severity: Secondary | ICD-10-CM | POA: Diagnosis not present

## 2019-04-03 DIAGNOSIS — I130 Hypertensive heart and chronic kidney disease with heart failure and stage 1 through stage 4 chronic kidney disease, or unspecified chronic kidney disease: Secondary | ICD-10-CM | POA: Diagnosis not present

## 2019-04-03 DIAGNOSIS — L97429 Non-pressure chronic ulcer of left heel and midfoot with unspecified severity: Secondary | ICD-10-CM | POA: Diagnosis not present

## 2019-04-03 DIAGNOSIS — I5031 Acute diastolic (congestive) heart failure: Secondary | ICD-10-CM | POA: Diagnosis not present

## 2019-04-03 DIAGNOSIS — M1030 Gout due to renal impairment, unspecified site: Secondary | ICD-10-CM | POA: Diagnosis not present

## 2019-04-04 DIAGNOSIS — N390 Urinary tract infection, site not specified: Secondary | ICD-10-CM | POA: Diagnosis not present

## 2019-04-04 DIAGNOSIS — I5031 Acute diastolic (congestive) heart failure: Secondary | ICD-10-CM | POA: Diagnosis not present

## 2019-04-04 DIAGNOSIS — I130 Hypertensive heart and chronic kidney disease with heart failure and stage 1 through stage 4 chronic kidney disease, or unspecified chronic kidney disease: Secondary | ICD-10-CM | POA: Diagnosis not present

## 2019-04-04 DIAGNOSIS — L97429 Non-pressure chronic ulcer of left heel and midfoot with unspecified severity: Secondary | ICD-10-CM | POA: Diagnosis not present

## 2019-04-04 DIAGNOSIS — E1122 Type 2 diabetes mellitus with diabetic chronic kidney disease: Secondary | ICD-10-CM | POA: Diagnosis not present

## 2019-04-04 DIAGNOSIS — E11621 Type 2 diabetes mellitus with foot ulcer: Secondary | ICD-10-CM | POA: Diagnosis not present

## 2019-04-04 DIAGNOSIS — M1030 Gout due to renal impairment, unspecified site: Secondary | ICD-10-CM | POA: Diagnosis not present

## 2019-04-04 DIAGNOSIS — L97319 Non-pressure chronic ulcer of right ankle with unspecified severity: Secondary | ICD-10-CM | POA: Diagnosis not present

## 2019-04-04 DIAGNOSIS — N183 Chronic kidney disease, stage 3 unspecified: Secondary | ICD-10-CM | POA: Diagnosis not present

## 2019-04-07 DIAGNOSIS — L97319 Non-pressure chronic ulcer of right ankle with unspecified severity: Secondary | ICD-10-CM | POA: Diagnosis not present

## 2019-04-07 DIAGNOSIS — E1122 Type 2 diabetes mellitus with diabetic chronic kidney disease: Secondary | ICD-10-CM | POA: Diagnosis not present

## 2019-04-07 DIAGNOSIS — M1030 Gout due to renal impairment, unspecified site: Secondary | ICD-10-CM | POA: Diagnosis not present

## 2019-04-07 DIAGNOSIS — N390 Urinary tract infection, site not specified: Secondary | ICD-10-CM | POA: Diagnosis not present

## 2019-04-07 DIAGNOSIS — N183 Chronic kidney disease, stage 3 unspecified: Secondary | ICD-10-CM | POA: Diagnosis not present

## 2019-04-07 DIAGNOSIS — I5031 Acute diastolic (congestive) heart failure: Secondary | ICD-10-CM | POA: Diagnosis not present

## 2019-04-07 DIAGNOSIS — L97429 Non-pressure chronic ulcer of left heel and midfoot with unspecified severity: Secondary | ICD-10-CM | POA: Diagnosis not present

## 2019-04-07 DIAGNOSIS — E11621 Type 2 diabetes mellitus with foot ulcer: Secondary | ICD-10-CM | POA: Diagnosis not present

## 2019-04-07 DIAGNOSIS — I130 Hypertensive heart and chronic kidney disease with heart failure and stage 1 through stage 4 chronic kidney disease, or unspecified chronic kidney disease: Secondary | ICD-10-CM | POA: Diagnosis not present

## 2019-04-08 ENCOUNTER — Ambulatory Visit (INDEPENDENT_AMBULATORY_CARE_PROVIDER_SITE_OTHER): Payer: Medicare HMO | Admitting: Urology

## 2019-04-08 DIAGNOSIS — E785 Hyperlipidemia, unspecified: Secondary | ICD-10-CM | POA: Diagnosis not present

## 2019-04-08 DIAGNOSIS — R338 Other retention of urine: Secondary | ICD-10-CM

## 2019-04-08 DIAGNOSIS — I129 Hypertensive chronic kidney disease with stage 1 through stage 4 chronic kidney disease, or unspecified chronic kidney disease: Secondary | ICD-10-CM | POA: Diagnosis not present

## 2019-04-08 DIAGNOSIS — N184 Chronic kidney disease, stage 4 (severe): Secondary | ICD-10-CM | POA: Diagnosis not present

## 2019-04-08 DIAGNOSIS — K219 Gastro-esophageal reflux disease without esophagitis: Secondary | ICD-10-CM | POA: Diagnosis not present

## 2019-04-08 DIAGNOSIS — E782 Mixed hyperlipidemia: Secondary | ICD-10-CM | POA: Diagnosis not present

## 2019-04-08 DIAGNOSIS — E1165 Type 2 diabetes mellitus with hyperglycemia: Secondary | ICD-10-CM | POA: Diagnosis not present

## 2019-04-08 DIAGNOSIS — I10 Essential (primary) hypertension: Secondary | ICD-10-CM | POA: Diagnosis not present

## 2019-04-09 DIAGNOSIS — I130 Hypertensive heart and chronic kidney disease with heart failure and stage 1 through stage 4 chronic kidney disease, or unspecified chronic kidney disease: Secondary | ICD-10-CM | POA: Diagnosis not present

## 2019-04-09 DIAGNOSIS — I5031 Acute diastolic (congestive) heart failure: Secondary | ICD-10-CM | POA: Diagnosis not present

## 2019-04-09 DIAGNOSIS — E1122 Type 2 diabetes mellitus with diabetic chronic kidney disease: Secondary | ICD-10-CM | POA: Diagnosis not present

## 2019-04-09 DIAGNOSIS — L97429 Non-pressure chronic ulcer of left heel and midfoot with unspecified severity: Secondary | ICD-10-CM | POA: Diagnosis not present

## 2019-04-09 DIAGNOSIS — E11621 Type 2 diabetes mellitus with foot ulcer: Secondary | ICD-10-CM | POA: Diagnosis not present

## 2019-04-09 DIAGNOSIS — L97319 Non-pressure chronic ulcer of right ankle with unspecified severity: Secondary | ICD-10-CM | POA: Diagnosis not present

## 2019-04-09 DIAGNOSIS — M1030 Gout due to renal impairment, unspecified site: Secondary | ICD-10-CM | POA: Diagnosis not present

## 2019-04-09 DIAGNOSIS — N183 Chronic kidney disease, stage 3 unspecified: Secondary | ICD-10-CM | POA: Diagnosis not present

## 2019-04-09 DIAGNOSIS — N390 Urinary tract infection, site not specified: Secondary | ICD-10-CM | POA: Diagnosis not present

## 2019-04-10 DIAGNOSIS — I130 Hypertensive heart and chronic kidney disease with heart failure and stage 1 through stage 4 chronic kidney disease, or unspecified chronic kidney disease: Secondary | ICD-10-CM | POA: Diagnosis not present

## 2019-04-10 DIAGNOSIS — N390 Urinary tract infection, site not specified: Secondary | ICD-10-CM | POA: Diagnosis not present

## 2019-04-10 DIAGNOSIS — E11621 Type 2 diabetes mellitus with foot ulcer: Secondary | ICD-10-CM | POA: Diagnosis not present

## 2019-04-10 DIAGNOSIS — N183 Chronic kidney disease, stage 3 unspecified: Secondary | ICD-10-CM | POA: Diagnosis not present

## 2019-04-10 DIAGNOSIS — L97319 Non-pressure chronic ulcer of right ankle with unspecified severity: Secondary | ICD-10-CM | POA: Diagnosis not present

## 2019-04-10 DIAGNOSIS — L97429 Non-pressure chronic ulcer of left heel and midfoot with unspecified severity: Secondary | ICD-10-CM | POA: Diagnosis not present

## 2019-04-10 DIAGNOSIS — M1030 Gout due to renal impairment, unspecified site: Secondary | ICD-10-CM | POA: Diagnosis not present

## 2019-04-10 DIAGNOSIS — E1122 Type 2 diabetes mellitus with diabetic chronic kidney disease: Secondary | ICD-10-CM | POA: Diagnosis not present

## 2019-04-10 DIAGNOSIS — I5031 Acute diastolic (congestive) heart failure: Secondary | ICD-10-CM | POA: Diagnosis not present

## 2019-04-11 DIAGNOSIS — E11621 Type 2 diabetes mellitus with foot ulcer: Secondary | ICD-10-CM | POA: Diagnosis not present

## 2019-04-11 DIAGNOSIS — M1030 Gout due to renal impairment, unspecified site: Secondary | ICD-10-CM | POA: Diagnosis not present

## 2019-04-11 DIAGNOSIS — N390 Urinary tract infection, site not specified: Secondary | ICD-10-CM | POA: Diagnosis not present

## 2019-04-11 DIAGNOSIS — L97429 Non-pressure chronic ulcer of left heel and midfoot with unspecified severity: Secondary | ICD-10-CM | POA: Diagnosis not present

## 2019-04-11 DIAGNOSIS — E1122 Type 2 diabetes mellitus with diabetic chronic kidney disease: Secondary | ICD-10-CM | POA: Diagnosis not present

## 2019-04-11 DIAGNOSIS — N183 Chronic kidney disease, stage 3 unspecified: Secondary | ICD-10-CM | POA: Diagnosis not present

## 2019-04-11 DIAGNOSIS — I130 Hypertensive heart and chronic kidney disease with heart failure and stage 1 through stage 4 chronic kidney disease, or unspecified chronic kidney disease: Secondary | ICD-10-CM | POA: Diagnosis not present

## 2019-04-11 DIAGNOSIS — I5031 Acute diastolic (congestive) heart failure: Secondary | ICD-10-CM | POA: Diagnosis not present

## 2019-04-11 DIAGNOSIS — L97319 Non-pressure chronic ulcer of right ankle with unspecified severity: Secondary | ICD-10-CM | POA: Diagnosis not present

## 2019-04-14 DIAGNOSIS — M1030 Gout due to renal impairment, unspecified site: Secondary | ICD-10-CM | POA: Diagnosis not present

## 2019-04-14 DIAGNOSIS — N183 Chronic kidney disease, stage 3 unspecified: Secondary | ICD-10-CM | POA: Diagnosis not present

## 2019-04-14 DIAGNOSIS — E11621 Type 2 diabetes mellitus with foot ulcer: Secondary | ICD-10-CM | POA: Diagnosis not present

## 2019-04-14 DIAGNOSIS — I130 Hypertensive heart and chronic kidney disease with heart failure and stage 1 through stage 4 chronic kidney disease, or unspecified chronic kidney disease: Secondary | ICD-10-CM | POA: Diagnosis not present

## 2019-04-14 DIAGNOSIS — I5031 Acute diastolic (congestive) heart failure: Secondary | ICD-10-CM | POA: Diagnosis not present

## 2019-04-14 DIAGNOSIS — L97429 Non-pressure chronic ulcer of left heel and midfoot with unspecified severity: Secondary | ICD-10-CM | POA: Diagnosis not present

## 2019-04-14 DIAGNOSIS — N390 Urinary tract infection, site not specified: Secondary | ICD-10-CM | POA: Diagnosis not present

## 2019-04-14 DIAGNOSIS — E1122 Type 2 diabetes mellitus with diabetic chronic kidney disease: Secondary | ICD-10-CM | POA: Diagnosis not present

## 2019-04-14 DIAGNOSIS — L97319 Non-pressure chronic ulcer of right ankle with unspecified severity: Secondary | ICD-10-CM | POA: Diagnosis not present

## 2019-04-16 DIAGNOSIS — I130 Hypertensive heart and chronic kidney disease with heart failure and stage 1 through stage 4 chronic kidney disease, or unspecified chronic kidney disease: Secondary | ICD-10-CM | POA: Diagnosis not present

## 2019-04-16 DIAGNOSIS — L97319 Non-pressure chronic ulcer of right ankle with unspecified severity: Secondary | ICD-10-CM | POA: Diagnosis not present

## 2019-04-16 DIAGNOSIS — E1122 Type 2 diabetes mellitus with diabetic chronic kidney disease: Secondary | ICD-10-CM | POA: Diagnosis not present

## 2019-04-16 DIAGNOSIS — I5031 Acute diastolic (congestive) heart failure: Secondary | ICD-10-CM | POA: Diagnosis not present

## 2019-04-16 DIAGNOSIS — E11621 Type 2 diabetes mellitus with foot ulcer: Secondary | ICD-10-CM | POA: Diagnosis not present

## 2019-04-16 DIAGNOSIS — L97429 Non-pressure chronic ulcer of left heel and midfoot with unspecified severity: Secondary | ICD-10-CM | POA: Diagnosis not present

## 2019-04-16 DIAGNOSIS — M1030 Gout due to renal impairment, unspecified site: Secondary | ICD-10-CM | POA: Diagnosis not present

## 2019-04-16 DIAGNOSIS — N183 Chronic kidney disease, stage 3 unspecified: Secondary | ICD-10-CM | POA: Diagnosis not present

## 2019-04-16 DIAGNOSIS — N390 Urinary tract infection, site not specified: Secondary | ICD-10-CM | POA: Diagnosis not present

## 2019-04-17 DIAGNOSIS — E11621 Type 2 diabetes mellitus with foot ulcer: Secondary | ICD-10-CM | POA: Diagnosis not present

## 2019-04-17 DIAGNOSIS — I130 Hypertensive heart and chronic kidney disease with heart failure and stage 1 through stage 4 chronic kidney disease, or unspecified chronic kidney disease: Secondary | ICD-10-CM | POA: Diagnosis not present

## 2019-04-17 DIAGNOSIS — N183 Chronic kidney disease, stage 3 unspecified: Secondary | ICD-10-CM | POA: Diagnosis not present

## 2019-04-17 DIAGNOSIS — I5031 Acute diastolic (congestive) heart failure: Secondary | ICD-10-CM | POA: Diagnosis not present

## 2019-04-17 DIAGNOSIS — L97319 Non-pressure chronic ulcer of right ankle with unspecified severity: Secondary | ICD-10-CM | POA: Diagnosis not present

## 2019-04-17 DIAGNOSIS — M1030 Gout due to renal impairment, unspecified site: Secondary | ICD-10-CM | POA: Diagnosis not present

## 2019-04-17 DIAGNOSIS — L97429 Non-pressure chronic ulcer of left heel and midfoot with unspecified severity: Secondary | ICD-10-CM | POA: Diagnosis not present

## 2019-04-17 DIAGNOSIS — E1122 Type 2 diabetes mellitus with diabetic chronic kidney disease: Secondary | ICD-10-CM | POA: Diagnosis not present

## 2019-04-17 DIAGNOSIS — N390 Urinary tract infection, site not specified: Secondary | ICD-10-CM | POA: Diagnosis not present

## 2019-04-18 DIAGNOSIS — E1122 Type 2 diabetes mellitus with diabetic chronic kidney disease: Secondary | ICD-10-CM | POA: Diagnosis not present

## 2019-04-18 DIAGNOSIS — L97319 Non-pressure chronic ulcer of right ankle with unspecified severity: Secondary | ICD-10-CM | POA: Diagnosis not present

## 2019-04-18 DIAGNOSIS — L97529 Non-pressure chronic ulcer of other part of left foot with unspecified severity: Secondary | ICD-10-CM | POA: Diagnosis not present

## 2019-04-18 DIAGNOSIS — I130 Hypertensive heart and chronic kidney disease with heart failure and stage 1 through stage 4 chronic kidney disease, or unspecified chronic kidney disease: Secondary | ICD-10-CM | POA: Diagnosis not present

## 2019-04-18 DIAGNOSIS — N390 Urinary tract infection, site not specified: Secondary | ICD-10-CM | POA: Diagnosis not present

## 2019-04-18 DIAGNOSIS — E11621 Type 2 diabetes mellitus with foot ulcer: Secondary | ICD-10-CM | POA: Diagnosis not present

## 2019-04-18 DIAGNOSIS — L97429 Non-pressure chronic ulcer of left heel and midfoot with unspecified severity: Secondary | ICD-10-CM | POA: Diagnosis not present

## 2019-04-18 DIAGNOSIS — I5031 Acute diastolic (congestive) heart failure: Secondary | ICD-10-CM | POA: Diagnosis not present

## 2019-04-18 DIAGNOSIS — N183 Chronic kidney disease, stage 3 unspecified: Secondary | ICD-10-CM | POA: Diagnosis not present

## 2019-04-18 DIAGNOSIS — M1030 Gout due to renal impairment, unspecified site: Secondary | ICD-10-CM | POA: Diagnosis not present

## 2019-04-21 DIAGNOSIS — L97529 Non-pressure chronic ulcer of other part of left foot with unspecified severity: Secondary | ICD-10-CM | POA: Diagnosis not present

## 2019-04-21 DIAGNOSIS — L97319 Non-pressure chronic ulcer of right ankle with unspecified severity: Secondary | ICD-10-CM | POA: Diagnosis not present

## 2019-04-22 DIAGNOSIS — L97429 Non-pressure chronic ulcer of left heel and midfoot with unspecified severity: Secondary | ICD-10-CM | POA: Diagnosis not present

## 2019-04-22 DIAGNOSIS — M1030 Gout due to renal impairment, unspecified site: Secondary | ICD-10-CM | POA: Diagnosis not present

## 2019-04-22 DIAGNOSIS — N390 Urinary tract infection, site not specified: Secondary | ICD-10-CM | POA: Diagnosis not present

## 2019-04-22 DIAGNOSIS — E1122 Type 2 diabetes mellitus with diabetic chronic kidney disease: Secondary | ICD-10-CM | POA: Diagnosis not present

## 2019-04-22 DIAGNOSIS — I130 Hypertensive heart and chronic kidney disease with heart failure and stage 1 through stage 4 chronic kidney disease, or unspecified chronic kidney disease: Secondary | ICD-10-CM | POA: Diagnosis not present

## 2019-04-22 DIAGNOSIS — L97319 Non-pressure chronic ulcer of right ankle with unspecified severity: Secondary | ICD-10-CM | POA: Diagnosis not present

## 2019-04-22 DIAGNOSIS — E11621 Type 2 diabetes mellitus with foot ulcer: Secondary | ICD-10-CM | POA: Diagnosis not present

## 2019-04-22 DIAGNOSIS — N183 Chronic kidney disease, stage 3 unspecified: Secondary | ICD-10-CM | POA: Diagnosis not present

## 2019-04-22 DIAGNOSIS — I5031 Acute diastolic (congestive) heart failure: Secondary | ICD-10-CM | POA: Diagnosis not present

## 2019-04-23 DIAGNOSIS — N390 Urinary tract infection, site not specified: Secondary | ICD-10-CM | POA: Diagnosis not present

## 2019-04-23 DIAGNOSIS — E1122 Type 2 diabetes mellitus with diabetic chronic kidney disease: Secondary | ICD-10-CM | POA: Diagnosis not present

## 2019-04-23 DIAGNOSIS — M1030 Gout due to renal impairment, unspecified site: Secondary | ICD-10-CM | POA: Diagnosis not present

## 2019-04-23 DIAGNOSIS — L97429 Non-pressure chronic ulcer of left heel and midfoot with unspecified severity: Secondary | ICD-10-CM | POA: Diagnosis not present

## 2019-04-23 DIAGNOSIS — I5031 Acute diastolic (congestive) heart failure: Secondary | ICD-10-CM | POA: Diagnosis not present

## 2019-04-23 DIAGNOSIS — I130 Hypertensive heart and chronic kidney disease with heart failure and stage 1 through stage 4 chronic kidney disease, or unspecified chronic kidney disease: Secondary | ICD-10-CM | POA: Diagnosis not present

## 2019-04-23 DIAGNOSIS — L97319 Non-pressure chronic ulcer of right ankle with unspecified severity: Secondary | ICD-10-CM | POA: Diagnosis not present

## 2019-04-23 DIAGNOSIS — E11621 Type 2 diabetes mellitus with foot ulcer: Secondary | ICD-10-CM | POA: Diagnosis not present

## 2019-04-23 DIAGNOSIS — N183 Chronic kidney disease, stage 3 unspecified: Secondary | ICD-10-CM | POA: Diagnosis not present

## 2019-04-25 DIAGNOSIS — L97429 Non-pressure chronic ulcer of left heel and midfoot with unspecified severity: Secondary | ICD-10-CM | POA: Diagnosis not present

## 2019-04-25 DIAGNOSIS — L97319 Non-pressure chronic ulcer of right ankle with unspecified severity: Secondary | ICD-10-CM | POA: Diagnosis not present

## 2019-04-25 DIAGNOSIS — M1030 Gout due to renal impairment, unspecified site: Secondary | ICD-10-CM | POA: Diagnosis not present

## 2019-04-25 DIAGNOSIS — I5031 Acute diastolic (congestive) heart failure: Secondary | ICD-10-CM | POA: Diagnosis not present

## 2019-04-25 DIAGNOSIS — I130 Hypertensive heart and chronic kidney disease with heart failure and stage 1 through stage 4 chronic kidney disease, or unspecified chronic kidney disease: Secondary | ICD-10-CM | POA: Diagnosis not present

## 2019-04-25 DIAGNOSIS — N390 Urinary tract infection, site not specified: Secondary | ICD-10-CM | POA: Diagnosis not present

## 2019-04-25 DIAGNOSIS — E1122 Type 2 diabetes mellitus with diabetic chronic kidney disease: Secondary | ICD-10-CM | POA: Diagnosis not present

## 2019-04-25 DIAGNOSIS — N183 Chronic kidney disease, stage 3 unspecified: Secondary | ICD-10-CM | POA: Diagnosis not present

## 2019-04-25 DIAGNOSIS — E11621 Type 2 diabetes mellitus with foot ulcer: Secondary | ICD-10-CM | POA: Diagnosis not present

## 2019-04-28 DIAGNOSIS — I130 Hypertensive heart and chronic kidney disease with heart failure and stage 1 through stage 4 chronic kidney disease, or unspecified chronic kidney disease: Secondary | ICD-10-CM | POA: Diagnosis not present

## 2019-04-28 DIAGNOSIS — E782 Mixed hyperlipidemia: Secondary | ICD-10-CM | POA: Diagnosis not present

## 2019-04-28 DIAGNOSIS — N390 Urinary tract infection, site not specified: Secondary | ICD-10-CM | POA: Diagnosis not present

## 2019-04-28 DIAGNOSIS — K219 Gastro-esophageal reflux disease without esophagitis: Secondary | ICD-10-CM | POA: Diagnosis not present

## 2019-04-28 DIAGNOSIS — E1165 Type 2 diabetes mellitus with hyperglycemia: Secondary | ICD-10-CM | POA: Diagnosis not present

## 2019-04-28 DIAGNOSIS — I10 Essential (primary) hypertension: Secondary | ICD-10-CM | POA: Diagnosis not present

## 2019-04-28 DIAGNOSIS — L97429 Non-pressure chronic ulcer of left heel and midfoot with unspecified severity: Secondary | ICD-10-CM | POA: Diagnosis not present

## 2019-04-28 DIAGNOSIS — E785 Hyperlipidemia, unspecified: Secondary | ICD-10-CM | POA: Diagnosis not present

## 2019-04-28 DIAGNOSIS — E1122 Type 2 diabetes mellitus with diabetic chronic kidney disease: Secondary | ICD-10-CM | POA: Diagnosis not present

## 2019-04-28 DIAGNOSIS — N183 Chronic kidney disease, stage 3 unspecified: Secondary | ICD-10-CM | POA: Diagnosis not present

## 2019-04-28 DIAGNOSIS — I129 Hypertensive chronic kidney disease with stage 1 through stage 4 chronic kidney disease, or unspecified chronic kidney disease: Secondary | ICD-10-CM | POA: Diagnosis not present

## 2019-04-28 DIAGNOSIS — E11621 Type 2 diabetes mellitus with foot ulcer: Secondary | ICD-10-CM | POA: Diagnosis not present

## 2019-04-28 DIAGNOSIS — N184 Chronic kidney disease, stage 4 (severe): Secondary | ICD-10-CM | POA: Diagnosis not present

## 2019-04-28 DIAGNOSIS — L97319 Non-pressure chronic ulcer of right ankle with unspecified severity: Secondary | ICD-10-CM | POA: Diagnosis not present

## 2019-04-28 DIAGNOSIS — M1030 Gout due to renal impairment, unspecified site: Secondary | ICD-10-CM | POA: Diagnosis not present

## 2019-04-28 DIAGNOSIS — I5031 Acute diastolic (congestive) heart failure: Secondary | ICD-10-CM | POA: Diagnosis not present

## 2019-04-30 DIAGNOSIS — Z23 Encounter for immunization: Secondary | ICD-10-CM | POA: Diagnosis not present

## 2019-04-30 DIAGNOSIS — L89512 Pressure ulcer of right ankle, stage 2: Secondary | ICD-10-CM | POA: Diagnosis not present

## 2019-04-30 DIAGNOSIS — E11649 Type 2 diabetes mellitus with hypoglycemia without coma: Secondary | ICD-10-CM | POA: Diagnosis not present

## 2019-04-30 DIAGNOSIS — J329 Chronic sinusitis, unspecified: Secondary | ICD-10-CM | POA: Diagnosis not present

## 2019-04-30 DIAGNOSIS — L89893 Pressure ulcer of other site, stage 3: Secondary | ICD-10-CM | POA: Diagnosis not present

## 2019-04-30 DIAGNOSIS — E1165 Type 2 diabetes mellitus with hyperglycemia: Secondary | ICD-10-CM | POA: Diagnosis not present

## 2019-04-30 DIAGNOSIS — E559 Vitamin D deficiency, unspecified: Secondary | ICD-10-CM | POA: Diagnosis not present

## 2019-04-30 DIAGNOSIS — Z0000 Encounter for general adult medical examination without abnormal findings: Secondary | ICD-10-CM | POA: Diagnosis not present

## 2019-04-30 DIAGNOSIS — R070 Pain in throat: Secondary | ICD-10-CM | POA: Diagnosis not present

## 2019-04-30 DIAGNOSIS — N184 Chronic kidney disease, stage 4 (severe): Secondary | ICD-10-CM | POA: Diagnosis not present

## 2019-04-30 DIAGNOSIS — N320 Bladder-neck obstruction: Secondary | ICD-10-CM | POA: Diagnosis not present

## 2019-04-30 DIAGNOSIS — L97909 Non-pressure chronic ulcer of unspecified part of unspecified lower leg with unspecified severity: Secondary | ICD-10-CM | POA: Diagnosis not present

## 2019-04-30 DIAGNOSIS — L89513 Pressure ulcer of right ankle, stage 3: Secondary | ICD-10-CM | POA: Diagnosis not present

## 2019-04-30 DIAGNOSIS — E8770 Fluid overload, unspecified: Secondary | ICD-10-CM | POA: Diagnosis not present

## 2019-05-02 DIAGNOSIS — E11621 Type 2 diabetes mellitus with foot ulcer: Secondary | ICD-10-CM | POA: Diagnosis not present

## 2019-05-02 DIAGNOSIS — L97319 Non-pressure chronic ulcer of right ankle with unspecified severity: Secondary | ICD-10-CM | POA: Diagnosis not present

## 2019-05-02 DIAGNOSIS — N390 Urinary tract infection, site not specified: Secondary | ICD-10-CM | POA: Diagnosis not present

## 2019-05-02 DIAGNOSIS — N183 Chronic kidney disease, stage 3 unspecified: Secondary | ICD-10-CM | POA: Diagnosis not present

## 2019-05-02 DIAGNOSIS — M1030 Gout due to renal impairment, unspecified site: Secondary | ICD-10-CM | POA: Diagnosis not present

## 2019-05-02 DIAGNOSIS — L97429 Non-pressure chronic ulcer of left heel and midfoot with unspecified severity: Secondary | ICD-10-CM | POA: Diagnosis not present

## 2019-05-02 DIAGNOSIS — I5031 Acute diastolic (congestive) heart failure: Secondary | ICD-10-CM | POA: Diagnosis not present

## 2019-05-02 DIAGNOSIS — E1122 Type 2 diabetes mellitus with diabetic chronic kidney disease: Secondary | ICD-10-CM | POA: Diagnosis not present

## 2019-05-02 DIAGNOSIS — I130 Hypertensive heart and chronic kidney disease with heart failure and stage 1 through stage 4 chronic kidney disease, or unspecified chronic kidney disease: Secondary | ICD-10-CM | POA: Diagnosis not present

## 2019-05-05 DIAGNOSIS — L97429 Non-pressure chronic ulcer of left heel and midfoot with unspecified severity: Secondary | ICD-10-CM | POA: Diagnosis not present

## 2019-05-05 DIAGNOSIS — E1122 Type 2 diabetes mellitus with diabetic chronic kidney disease: Secondary | ICD-10-CM | POA: Diagnosis not present

## 2019-05-05 DIAGNOSIS — N390 Urinary tract infection, site not specified: Secondary | ICD-10-CM | POA: Diagnosis not present

## 2019-05-05 DIAGNOSIS — E11621 Type 2 diabetes mellitus with foot ulcer: Secondary | ICD-10-CM | POA: Diagnosis not present

## 2019-05-05 DIAGNOSIS — L97319 Non-pressure chronic ulcer of right ankle with unspecified severity: Secondary | ICD-10-CM | POA: Diagnosis not present

## 2019-05-05 DIAGNOSIS — I130 Hypertensive heart and chronic kidney disease with heart failure and stage 1 through stage 4 chronic kidney disease, or unspecified chronic kidney disease: Secondary | ICD-10-CM | POA: Diagnosis not present

## 2019-05-05 DIAGNOSIS — M1030 Gout due to renal impairment, unspecified site: Secondary | ICD-10-CM | POA: Diagnosis not present

## 2019-05-05 DIAGNOSIS — N183 Chronic kidney disease, stage 3 unspecified: Secondary | ICD-10-CM | POA: Diagnosis not present

## 2019-05-05 DIAGNOSIS — I5031 Acute diastolic (congestive) heart failure: Secondary | ICD-10-CM | POA: Diagnosis not present

## 2019-05-06 ENCOUNTER — Ambulatory Visit: Payer: Medicare HMO | Admitting: Urology

## 2019-05-06 DIAGNOSIS — R338 Other retention of urine: Secondary | ICD-10-CM

## 2019-05-07 DIAGNOSIS — L97319 Non-pressure chronic ulcer of right ankle with unspecified severity: Secondary | ICD-10-CM | POA: Diagnosis not present

## 2019-05-07 DIAGNOSIS — I5031 Acute diastolic (congestive) heart failure: Secondary | ICD-10-CM | POA: Diagnosis not present

## 2019-05-07 DIAGNOSIS — I130 Hypertensive heart and chronic kidney disease with heart failure and stage 1 through stage 4 chronic kidney disease, or unspecified chronic kidney disease: Secondary | ICD-10-CM | POA: Diagnosis not present

## 2019-05-07 DIAGNOSIS — E11621 Type 2 diabetes mellitus with foot ulcer: Secondary | ICD-10-CM | POA: Diagnosis not present

## 2019-05-07 DIAGNOSIS — M1030 Gout due to renal impairment, unspecified site: Secondary | ICD-10-CM | POA: Diagnosis not present

## 2019-05-07 DIAGNOSIS — L97429 Non-pressure chronic ulcer of left heel and midfoot with unspecified severity: Secondary | ICD-10-CM | POA: Diagnosis not present

## 2019-05-07 DIAGNOSIS — N390 Urinary tract infection, site not specified: Secondary | ICD-10-CM | POA: Diagnosis not present

## 2019-05-07 DIAGNOSIS — E1122 Type 2 diabetes mellitus with diabetic chronic kidney disease: Secondary | ICD-10-CM | POA: Diagnosis not present

## 2019-05-07 DIAGNOSIS — N183 Chronic kidney disease, stage 3 unspecified: Secondary | ICD-10-CM | POA: Diagnosis not present

## 2019-05-09 DIAGNOSIS — N390 Urinary tract infection, site not specified: Secondary | ICD-10-CM | POA: Diagnosis not present

## 2019-05-09 DIAGNOSIS — L97429 Non-pressure chronic ulcer of left heel and midfoot with unspecified severity: Secondary | ICD-10-CM | POA: Diagnosis not present

## 2019-05-09 DIAGNOSIS — I130 Hypertensive heart and chronic kidney disease with heart failure and stage 1 through stage 4 chronic kidney disease, or unspecified chronic kidney disease: Secondary | ICD-10-CM | POA: Diagnosis not present

## 2019-05-09 DIAGNOSIS — M1030 Gout due to renal impairment, unspecified site: Secondary | ICD-10-CM | POA: Diagnosis not present

## 2019-05-09 DIAGNOSIS — L97319 Non-pressure chronic ulcer of right ankle with unspecified severity: Secondary | ICD-10-CM | POA: Diagnosis not present

## 2019-05-09 DIAGNOSIS — N183 Chronic kidney disease, stage 3 unspecified: Secondary | ICD-10-CM | POA: Diagnosis not present

## 2019-05-09 DIAGNOSIS — E11621 Type 2 diabetes mellitus with foot ulcer: Secondary | ICD-10-CM | POA: Diagnosis not present

## 2019-05-09 DIAGNOSIS — I5031 Acute diastolic (congestive) heart failure: Secondary | ICD-10-CM | POA: Diagnosis not present

## 2019-05-09 DIAGNOSIS — E1122 Type 2 diabetes mellitus with diabetic chronic kidney disease: Secondary | ICD-10-CM | POA: Diagnosis not present

## 2019-05-12 DIAGNOSIS — E11621 Type 2 diabetes mellitus with foot ulcer: Secondary | ICD-10-CM | POA: Diagnosis not present

## 2019-05-12 DIAGNOSIS — I130 Hypertensive heart and chronic kidney disease with heart failure and stage 1 through stage 4 chronic kidney disease, or unspecified chronic kidney disease: Secondary | ICD-10-CM | POA: Diagnosis not present

## 2019-05-12 DIAGNOSIS — N183 Chronic kidney disease, stage 3 unspecified: Secondary | ICD-10-CM | POA: Diagnosis not present

## 2019-05-12 DIAGNOSIS — L97429 Non-pressure chronic ulcer of left heel and midfoot with unspecified severity: Secondary | ICD-10-CM | POA: Diagnosis not present

## 2019-05-12 DIAGNOSIS — N390 Urinary tract infection, site not specified: Secondary | ICD-10-CM | POA: Diagnosis not present

## 2019-05-12 DIAGNOSIS — L97319 Non-pressure chronic ulcer of right ankle with unspecified severity: Secondary | ICD-10-CM | POA: Diagnosis not present

## 2019-05-12 DIAGNOSIS — M1030 Gout due to renal impairment, unspecified site: Secondary | ICD-10-CM | POA: Diagnosis not present

## 2019-05-12 DIAGNOSIS — E1122 Type 2 diabetes mellitus with diabetic chronic kidney disease: Secondary | ICD-10-CM | POA: Diagnosis not present

## 2019-05-12 DIAGNOSIS — I5031 Acute diastolic (congestive) heart failure: Secondary | ICD-10-CM | POA: Diagnosis not present

## 2019-05-14 ENCOUNTER — Ambulatory Visit (INDEPENDENT_AMBULATORY_CARE_PROVIDER_SITE_OTHER): Payer: Medicare HMO | Admitting: Urology

## 2019-05-14 DIAGNOSIS — L89512 Pressure ulcer of right ankle, stage 2: Secondary | ICD-10-CM | POA: Diagnosis not present

## 2019-05-14 DIAGNOSIS — N320 Bladder-neck obstruction: Secondary | ICD-10-CM | POA: Diagnosis not present

## 2019-05-14 DIAGNOSIS — R338 Other retention of urine: Secondary | ICD-10-CM | POA: Diagnosis not present

## 2019-05-15 DIAGNOSIS — Z7984 Long term (current) use of oral hypoglycemic drugs: Secondary | ICD-10-CM | POA: Diagnosis not present

## 2019-05-15 DIAGNOSIS — H2513 Age-related nuclear cataract, bilateral: Secondary | ICD-10-CM | POA: Diagnosis not present

## 2019-05-15 DIAGNOSIS — E113313 Type 2 diabetes mellitus with moderate nonproliferative diabetic retinopathy with macular edema, bilateral: Secondary | ICD-10-CM | POA: Diagnosis not present

## 2019-05-15 DIAGNOSIS — H02403 Unspecified ptosis of bilateral eyelids: Secondary | ICD-10-CM | POA: Diagnosis not present

## 2019-05-15 DIAGNOSIS — H524 Presbyopia: Secondary | ICD-10-CM | POA: Diagnosis not present

## 2019-05-15 DIAGNOSIS — E1165 Type 2 diabetes mellitus with hyperglycemia: Secondary | ICD-10-CM | POA: Diagnosis not present

## 2019-05-15 DIAGNOSIS — Z794 Long term (current) use of insulin: Secondary | ICD-10-CM | POA: Diagnosis not present

## 2019-05-16 DIAGNOSIS — I130 Hypertensive heart and chronic kidney disease with heart failure and stage 1 through stage 4 chronic kidney disease, or unspecified chronic kidney disease: Secondary | ICD-10-CM | POA: Diagnosis not present

## 2019-05-16 DIAGNOSIS — L97319 Non-pressure chronic ulcer of right ankle with unspecified severity: Secondary | ICD-10-CM | POA: Diagnosis not present

## 2019-05-16 DIAGNOSIS — I5031 Acute diastolic (congestive) heart failure: Secondary | ICD-10-CM | POA: Diagnosis not present

## 2019-05-16 DIAGNOSIS — L97429 Non-pressure chronic ulcer of left heel and midfoot with unspecified severity: Secondary | ICD-10-CM | POA: Diagnosis not present

## 2019-05-16 DIAGNOSIS — N390 Urinary tract infection, site not specified: Secondary | ICD-10-CM | POA: Diagnosis not present

## 2019-05-16 DIAGNOSIS — N183 Chronic kidney disease, stage 3 unspecified: Secondary | ICD-10-CM | POA: Diagnosis not present

## 2019-05-16 DIAGNOSIS — M1030 Gout due to renal impairment, unspecified site: Secondary | ICD-10-CM | POA: Diagnosis not present

## 2019-05-16 DIAGNOSIS — E1122 Type 2 diabetes mellitus with diabetic chronic kidney disease: Secondary | ICD-10-CM | POA: Diagnosis not present

## 2019-05-16 DIAGNOSIS — E11621 Type 2 diabetes mellitus with foot ulcer: Secondary | ICD-10-CM | POA: Diagnosis not present

## 2019-05-19 DIAGNOSIS — M1030 Gout due to renal impairment, unspecified site: Secondary | ICD-10-CM | POA: Diagnosis not present

## 2019-05-19 DIAGNOSIS — L97319 Non-pressure chronic ulcer of right ankle with unspecified severity: Secondary | ICD-10-CM | POA: Diagnosis not present

## 2019-05-19 DIAGNOSIS — N390 Urinary tract infection, site not specified: Secondary | ICD-10-CM | POA: Diagnosis not present

## 2019-05-19 DIAGNOSIS — I130 Hypertensive heart and chronic kidney disease with heart failure and stage 1 through stage 4 chronic kidney disease, or unspecified chronic kidney disease: Secondary | ICD-10-CM | POA: Diagnosis not present

## 2019-05-19 DIAGNOSIS — L97429 Non-pressure chronic ulcer of left heel and midfoot with unspecified severity: Secondary | ICD-10-CM | POA: Diagnosis not present

## 2019-05-19 DIAGNOSIS — E1122 Type 2 diabetes mellitus with diabetic chronic kidney disease: Secondary | ICD-10-CM | POA: Diagnosis not present

## 2019-05-19 DIAGNOSIS — E11621 Type 2 diabetes mellitus with foot ulcer: Secondary | ICD-10-CM | POA: Diagnosis not present

## 2019-05-19 DIAGNOSIS — N183 Chronic kidney disease, stage 3 unspecified: Secondary | ICD-10-CM | POA: Diagnosis not present

## 2019-05-19 DIAGNOSIS — I5031 Acute diastolic (congestive) heart failure: Secondary | ICD-10-CM | POA: Diagnosis not present

## 2019-05-21 DIAGNOSIS — I5031 Acute diastolic (congestive) heart failure: Secondary | ICD-10-CM | POA: Diagnosis not present

## 2019-05-21 DIAGNOSIS — E11621 Type 2 diabetes mellitus with foot ulcer: Secondary | ICD-10-CM | POA: Diagnosis not present

## 2019-05-21 DIAGNOSIS — L97319 Non-pressure chronic ulcer of right ankle with unspecified severity: Secondary | ICD-10-CM | POA: Diagnosis not present

## 2019-05-21 DIAGNOSIS — M1030 Gout due to renal impairment, unspecified site: Secondary | ICD-10-CM | POA: Diagnosis not present

## 2019-05-21 DIAGNOSIS — N390 Urinary tract infection, site not specified: Secondary | ICD-10-CM | POA: Diagnosis not present

## 2019-05-21 DIAGNOSIS — L97429 Non-pressure chronic ulcer of left heel and midfoot with unspecified severity: Secondary | ICD-10-CM | POA: Diagnosis not present

## 2019-05-21 DIAGNOSIS — N183 Chronic kidney disease, stage 3 unspecified: Secondary | ICD-10-CM | POA: Diagnosis not present

## 2019-05-21 DIAGNOSIS — I130 Hypertensive heart and chronic kidney disease with heart failure and stage 1 through stage 4 chronic kidney disease, or unspecified chronic kidney disease: Secondary | ICD-10-CM | POA: Diagnosis not present

## 2019-05-21 DIAGNOSIS — E1122 Type 2 diabetes mellitus with diabetic chronic kidney disease: Secondary | ICD-10-CM | POA: Diagnosis not present

## 2019-05-22 ENCOUNTER — Other Ambulatory Visit: Payer: Self-pay

## 2019-05-22 ENCOUNTER — Emergency Department (HOSPITAL_COMMUNITY)
Admission: EM | Admit: 2019-05-22 | Discharge: 2019-05-22 | Disposition: A | Discharge status: Home / Self Care | Payer: Medicare HMO | Attending: Emergency Medicine | Admitting: Emergency Medicine

## 2019-05-22 ENCOUNTER — Encounter (HOSPITAL_COMMUNITY): Payer: Self-pay | Admitting: Emergency Medicine

## 2019-05-22 DIAGNOSIS — Z466 Encounter for fitting and adjustment of urinary device: Secondary | ICD-10-CM | POA: Diagnosis not present

## 2019-05-22 DIAGNOSIS — I10 Essential (primary) hypertension: Secondary | ICD-10-CM | POA: Insufficient documentation

## 2019-05-22 DIAGNOSIS — T839XXA Unspecified complication of genitourinary prosthetic device, implant and graft, initial encounter: Secondary | ICD-10-CM

## 2019-05-22 DIAGNOSIS — E119 Type 2 diabetes mellitus without complications: Secondary | ICD-10-CM | POA: Insufficient documentation

## 2019-05-22 DIAGNOSIS — T83098A Other mechanical complication of other indwelling urethral catheter, initial encounter: Secondary | ICD-10-CM | POA: Diagnosis not present

## 2019-05-22 DIAGNOSIS — Z79899 Other long term (current) drug therapy: Secondary | ICD-10-CM | POA: Insufficient documentation

## 2019-05-22 NOTE — ED Notes (Signed)
ED Provider at bedside. 

## 2019-05-22 NOTE — ED Triage Notes (Signed)
Pt states foley cath came out. Came to ED for CHF in august and has had foley since. States bladder doesn't work like it should. A/o. Nad.

## 2019-05-22 NOTE — ED Provider Notes (Signed)
[facility] EMERGENCY DEPARTMENT Provider Note   CSN: [id number] Arrival date & time: 05/22/19  1922     History   Chief Complaint Chief Complaint  Patient presents with  . Vascular Access Problem    foley    HPI Danny [name] is a 63 y.o. male with a history of DM, htn and arthritis, currently under the care of [facility] with current chronic indwelling Foley catheter was at home tonight when it started leaking around the catheter and not draining into the bag.  He reports leaking a puddle of urine onto the floor, prior felt suprapubic pressure which has resolved. Denies hematuria, fevers, chills, back pain.      HPI  Past Medical History:  Diagnosis Date  . Arthritis   . Diabetes mellitus without complication ([place])   . Foot ulcer ([place])   . Hypertension     Patient Active Problem List   Diagnosis Date Noted  . Anasarca 03/23/2019  . Bladder outlet obstruction 03/23/2019  . Yeast infection of the skin 03/16/2019  . Diabetic ulcer of left foot ([place]) 03/15/2019  . Diabetic ulcer of right ankle ([place]) 03/15/2019  . Hypertension associated with stage 3 chronic kidney disease due to type 2 diabetes mellitus ([place]) 03/09/2019  . Controlled type 2 diabetes mellitus with stage 3 chronic kidney disease, with long-term current use of insulin ([place]) 03/09/2019  . Dyslipidemia associated with type 2 diabetes mellitus ([place]) 03/09/2019  . Chronic gout due to renal impairment without tophus 03/09/2019  . Emphysematous cystitis 03/05/2019  . Bilateral hydronephrosis   . Bilateral cellulitis of lower leg 03/03/2019  . Acute urinary retention 03/03/2019  . Klebsiella Cystitis 03/03/2019  . UTI due to Klebsiella species 03/03/2019  . Plantar ulcer of left foot ([place]) 03/03/2019  . Acute on chronic diastolic (congestive) heart failure ([place]) 02/25/2019  . Acute respiratory failure with hypoxia ([place]) 02/25/2019  . Acute renal failure superimposed on stage 3 chronic kidney disease ([place])  02/24/2019  . Acute CHF (congestive heart failure) ([place]) 02/24/2019  . Dyspnea 02/23/2019  . Essential hypertension 02/23/2019  . Diabetes mellitus ([place]) 02/23/2019  . CKD (chronic kidney disease) 02/23/2019  . Psoriasis 02/23/2019    Past Surgical History:  Procedure Laterality Date  . ANKLE SURGERY    . CHOLECYSTECTOMY    . FOOT SURGERY          Home Medications    Prior to Admission medications   Medication Sig Start Date End Date Taking? Authorizing Provider  acetaminophen (TYLENOL) 325 MG tablet Take 650 mg by mouth as needed for mild pain or moderate pain.     [provider]  allopurinol (ZYLOPRIM) 300 MG tablet Take 1 tablet (300 mg total) by mouth daily. 03/20/19   Gerlene Fee, NP  Amino Acids-Protein Hydrolys (FEEDING SUPPLEMENT, PRO-STAT SUGAR FREE 64,) LIQD Take 30 mLs by mouth 2 (two) times daily between meals.    [provider]  amLODipine (NORVASC) 10 MG tablet Take 1 tablet (10 mg total) by mouth at bedtime. 03/20/19   Gerlene Fee, NP  Brodalumab 210 MG/1.5ML SOSY Inject 210 mg into the skin every 14 (fourteen) days. 12/25/17   [provider]  collagenase (SANTYL) ointment Ointment; 250 unit/gram; topical  Special Instructions: Apply to wounds per treatment orders.    [provider]  Colloidal Oatmeal (ECZEMA MOISTURIZING EX) Apply 1 application topically daily as needed (applied to legs).    [provider]  folic acid (FOLVITE) 1 MG tablet  Take 1 tablet (1 mg total) by mouth daily. 03/20/19   Gerlene Fee, NP  insulin aspart protamine - aspart (NOVOLOG MIX 70/30 FLEXPEN) (70-30) 100 UNIT/ML FlexPen Inject 0.3 mLs (30 Units total) into the skin 2 (two) times daily with a meal. 03/20/19   Nyoka Cowden, Phylis Bougie, NP  linagliptin (TRADJENTA) 5 MG TABS tablet Take 1 tablet (5 mg total) by mouth daily. 03/20/19   Gerlene Fee, NP  Melatonin 1 MG CAPS Take 1 mg by mouth as needed. For insomnia    [provider]  metoprolol succinate (TOPROL-XL) 100 MG 24 hr tablet Take 1 tablet (100 mg total) by mouth every evening. 03/20/19   Gerlene Fee, NP  NON FORMULARY Diet: Regular, NAS, Consistent Carbohydrate    [provider]  pravastatin (PRAVACHOL) 80 MG tablet Take 1 tablet (80 mg total) by mouth every evening. 03/20/19   Gerlene Fee, NP  senna-docusate (SENOKOT-S) 8.6-50 MG tablet Take 2 tablets by mouth at bedtime as needed for moderate constipation. 03/05/19   Orson Eva, MD  silver nitrate applicators 02-40 % applicator Topical  Special Instructions: Apply to rolled borders of wound to right lateral malleolus daily as needed for epithelized borders. Once A Day - PRN    [provider]  silver nitrate applicators 97-35 % applicator ; topical  Special Instructions: Apply to rolled borders of wound to left plantar foot daily as needed for epithelized borders. Once A Day - PRN PRN 1    [provider]  tamsulosin (FLOMAX) 0.4 MG CAPS capsule Take 1 capsule (0.4 mg total) by mouth daily after supper. 03/20/19   Gerlene Fee, NP  torsemide (DEMADEX) 20 MG tablet Take 1 tablet (20 mg total) by mouth daily. 03/20/19   Gerlene Fee, NP    Family History History reviewed. No pertinent family history.  Social History Social History   Tobacco Use  . Smoking status: Never Smoker  . Smokeless tobacco: Never Used  Substance Use Topics  . Alcohol use: No  . Drug use: No     Allergies   Dust mite extract, Prednisone, and Rocephin [ceftriaxone]   Review of Systems Review of Systems  Constitutional: Negative for chills and fever.  HENT: Negative for congestion and sore throat.   Eyes: Negative.   Respiratory: Negative for chest tightness and shortness of breath.   Cardiovascular: Negative for chest pain.  Gastrointestinal: Negative for abdominal pain, nausea and vomiting.  Genitourinary: Negative for flank pain and hematuria.       Negative except as  mentioned in HPI.   Musculoskeletal: Negative for arthralgias, joint swelling and neck pain.  Skin: Negative.  Negative for rash and wound.  Neurological: Negative for dizziness, weakness, light-headedness, numbness and headaches.  Psychiatric/Behavioral: Negative.      Physical Exam Updated Vital Signs BP (!) 146/74 (BP Location: Right Arm)   Pulse 79   Temp 99.2 F (37.3 C) (Oral)   Resp 18   Ht 6' (1.829 m)   Wt 129.3 kg   SpO2 97%   BMI 38.65 kg/m   Physical Exam Vitals signs and nursing note reviewed.  Constitutional:      Appearance: He is well-developed.  HENT:     Head: Normocephalic and atraumatic.  Cardiovascular:     Rate and Rhythm: Normal rate and regular rhythm.     Heart sounds: Normal heart sounds.  Pulmonary:     Effort: Pulmonary effort is normal.  Breath sounds: Normal breath sounds. No wheezing.  Abdominal:     General: Bowel sounds are normal.     Palpations: Abdomen is soft.     Tenderness: There is no abdominal tenderness.  Musculoskeletal: Normal range of motion.  Skin:    General: Skin is warm and dry.  Neurological:     General: No focal deficit present.     Mental Status: He is alert.      ED Treatments / Results  Labs (all labs ordered are listed, but only abnormal results are displayed) Labs Reviewed - No data to display  EKG None  Radiology No results found.  Procedures Procedures (including critical care time)  Medications Ordered in ED Medications - No data to display   Initial Impression / Assessment and Plan / ED Course  I have reviewed the triage vital signs and the nursing notes.  Pertinent labs & imaging results that were available during my care of the patient were reviewed by me and considered in my medical decision making (see chart for details).        Pt examined after Foley catheter changed. Clear, light yellow urine in tubing and bag.   Sx free at time of exam. He has f/u with urology in about 2  weeks.  Prn f/u anticipated.  Final Clinical Impressions(s) / ED Diagnoses   Final diagnoses:  Foley catheter problem, initial encounter [facility])    ED Discharge Orders    None       Landis Martins 05/22/19 2239    Virgel Manifold, MD 05/23/19 2035

## 2019-05-24 DIAGNOSIS — L97319 Non-pressure chronic ulcer of right ankle with unspecified severity: Secondary | ICD-10-CM | POA: Diagnosis not present

## 2019-05-24 DIAGNOSIS — M1030 Gout due to renal impairment, unspecified site: Secondary | ICD-10-CM | POA: Diagnosis not present

## 2019-05-24 DIAGNOSIS — I5032 Chronic diastolic (congestive) heart failure: Secondary | ICD-10-CM | POA: Diagnosis not present

## 2019-05-24 DIAGNOSIS — N183 Chronic kidney disease, stage 3 unspecified: Secondary | ICD-10-CM | POA: Diagnosis not present

## 2019-05-24 DIAGNOSIS — L97429 Non-pressure chronic ulcer of left heel and midfoot with unspecified severity: Secondary | ICD-10-CM | POA: Diagnosis not present

## 2019-05-24 DIAGNOSIS — I130 Hypertensive heart and chronic kidney disease with heart failure and stage 1 through stage 4 chronic kidney disease, or unspecified chronic kidney disease: Secondary | ICD-10-CM | POA: Diagnosis not present

## 2019-05-24 DIAGNOSIS — E1122 Type 2 diabetes mellitus with diabetic chronic kidney disease: Secondary | ICD-10-CM | POA: Diagnosis not present

## 2019-05-24 DIAGNOSIS — E11621 Type 2 diabetes mellitus with foot ulcer: Secondary | ICD-10-CM | POA: Diagnosis not present

## 2019-05-24 DIAGNOSIS — E1151 Type 2 diabetes mellitus with diabetic peripheral angiopathy without gangrene: Secondary | ICD-10-CM | POA: Diagnosis not present

## 2019-05-26 DIAGNOSIS — E113313 Type 2 diabetes mellitus with moderate nonproliferative diabetic retinopathy with macular edema, bilateral: Secondary | ICD-10-CM | POA: Diagnosis not present

## 2019-05-27 DIAGNOSIS — I5032 Chronic diastolic (congestive) heart failure: Secondary | ICD-10-CM | POA: Diagnosis not present

## 2019-05-27 DIAGNOSIS — E1122 Type 2 diabetes mellitus with diabetic chronic kidney disease: Secondary | ICD-10-CM | POA: Diagnosis not present

## 2019-05-27 DIAGNOSIS — L97319 Non-pressure chronic ulcer of right ankle with unspecified severity: Secondary | ICD-10-CM | POA: Diagnosis not present

## 2019-05-27 DIAGNOSIS — N183 Chronic kidney disease, stage 3 unspecified: Secondary | ICD-10-CM | POA: Diagnosis not present

## 2019-05-27 DIAGNOSIS — I130 Hypertensive heart and chronic kidney disease with heart failure and stage 1 through stage 4 chronic kidney disease, or unspecified chronic kidney disease: Secondary | ICD-10-CM | POA: Diagnosis not present

## 2019-05-27 DIAGNOSIS — L97429 Non-pressure chronic ulcer of left heel and midfoot with unspecified severity: Secondary | ICD-10-CM | POA: Diagnosis not present

## 2019-05-27 DIAGNOSIS — M1030 Gout due to renal impairment, unspecified site: Secondary | ICD-10-CM | POA: Diagnosis not present

## 2019-05-27 DIAGNOSIS — E11621 Type 2 diabetes mellitus with foot ulcer: Secondary | ICD-10-CM | POA: Diagnosis not present

## 2019-05-27 DIAGNOSIS — E1151 Type 2 diabetes mellitus with diabetic peripheral angiopathy without gangrene: Secondary | ICD-10-CM | POA: Diagnosis not present

## 2019-06-05 DIAGNOSIS — E1151 Type 2 diabetes mellitus with diabetic peripheral angiopathy without gangrene: Secondary | ICD-10-CM | POA: Diagnosis not present

## 2019-06-05 DIAGNOSIS — E559 Vitamin D deficiency, unspecified: Secondary | ICD-10-CM | POA: Diagnosis not present

## 2019-06-05 DIAGNOSIS — N184 Chronic kidney disease, stage 4 (severe): Secondary | ICD-10-CM | POA: Diagnosis not present

## 2019-06-05 DIAGNOSIS — E782 Mixed hyperlipidemia: Secondary | ICD-10-CM | POA: Diagnosis not present

## 2019-06-05 DIAGNOSIS — E11621 Type 2 diabetes mellitus with foot ulcer: Secondary | ICD-10-CM | POA: Diagnosis not present

## 2019-06-05 DIAGNOSIS — E11649 Type 2 diabetes mellitus with hypoglycemia without coma: Secondary | ICD-10-CM | POA: Diagnosis not present

## 2019-06-05 DIAGNOSIS — D509 Iron deficiency anemia, unspecified: Secondary | ICD-10-CM | POA: Diagnosis not present

## 2019-06-05 DIAGNOSIS — E1165 Type 2 diabetes mellitus with hyperglycemia: Secondary | ICD-10-CM | POA: Diagnosis not present

## 2019-06-05 DIAGNOSIS — Z125 Encounter for screening for malignant neoplasm of prostate: Secondary | ICD-10-CM | POA: Diagnosis not present

## 2019-06-05 DIAGNOSIS — D631 Anemia in chronic kidney disease: Secondary | ICD-10-CM | POA: Diagnosis not present

## 2019-06-10 DIAGNOSIS — I129 Hypertensive chronic kidney disease with stage 1 through stage 4 chronic kidney disease, or unspecified chronic kidney disease: Secondary | ICD-10-CM | POA: Diagnosis not present

## 2019-06-10 DIAGNOSIS — L409 Psoriasis, unspecified: Secondary | ICD-10-CM | POA: Diagnosis not present

## 2019-06-10 DIAGNOSIS — N184 Chronic kidney disease, stage 4 (severe): Secondary | ICD-10-CM | POA: Diagnosis not present

## 2019-06-10 DIAGNOSIS — I10 Essential (primary) hypertension: Secondary | ICD-10-CM | POA: Diagnosis not present

## 2019-06-10 DIAGNOSIS — Z712 Person consulting for explanation of examination or test findings: Secondary | ICD-10-CM | POA: Diagnosis not present

## 2019-06-10 DIAGNOSIS — D631 Anemia in chronic kidney disease: Secondary | ICD-10-CM | POA: Diagnosis not present

## 2019-06-10 DIAGNOSIS — E1122 Type 2 diabetes mellitus with diabetic chronic kidney disease: Secondary | ICD-10-CM | POA: Diagnosis not present

## 2019-06-10 DIAGNOSIS — E782 Mixed hyperlipidemia: Secondary | ICD-10-CM | POA: Diagnosis not present

## 2019-06-10 DIAGNOSIS — E1165 Type 2 diabetes mellitus with hyperglycemia: Secondary | ICD-10-CM | POA: Diagnosis not present

## 2019-06-13 DIAGNOSIS — I890 Lymphedema, not elsewhere classified: Secondary | ICD-10-CM | POA: Diagnosis not present

## 2019-06-13 DIAGNOSIS — L89512 Pressure ulcer of right ankle, stage 2: Secondary | ICD-10-CM | POA: Diagnosis not present

## 2019-06-17 ENCOUNTER — Ambulatory Visit: Payer: Medicare HMO | Admitting: Urology

## 2019-06-17 ENCOUNTER — Other Ambulatory Visit: Payer: Self-pay

## 2019-06-17 ENCOUNTER — Ambulatory Visit (INDEPENDENT_AMBULATORY_CARE_PROVIDER_SITE_OTHER): Payer: Medicare HMO | Admitting: Urology

## 2019-06-17 VITALS — BP 131/68 | HR 74 | Temp 96.7°F | Ht 70.0 in

## 2019-06-17 DIAGNOSIS — N312 Flaccid neuropathic bladder, not elsewhere classified: Secondary | ICD-10-CM

## 2019-06-17 NOTE — Progress Notes (Signed)
Cath Change/ Replacement  Patient is present today for a catheter change due to urinary retention.  52ml of water was removed from the balloon, a 16FR foley cath was removed with out difficulty.  Patient was cleaned and prepped in a sterile fashion with betadine and 2% lidocaine jelly was instilled into the urethra. A 16 FR foley cath was replaced into the bladder no complications were noted Urine return was noted. The balloon was filled with 2ml of sterile water. A leg bag was attached for drainage. Patient was given proper instruction on catheter care.    Performed by: Dublin Cantero LPN  Follow up: Per MD note

## 2019-06-17 NOTE — Progress Notes (Signed)
Progress Note  Chief Complaint: Urinary Retention  History of Present Illness:   12.22.2020: He returns today for follow-up. Since last visit, he has returned for monthly cath changes. On 11.26 he presented to the ER to replace his cath after it "fell out." He similiarly returned on 11.18 and 11.10 to have catheters replaced -- once because he got his foot caught in his tubing and pulled it out and the other due to reasons he cannot quite recall. He is hesitant about CIC but he thinks this indwelling cath is not working very well for him.   (below is copied from [place] records)  10.6.2020: He is here today after having what he reports was a bladder infection in early September (9.6.2020) for which he was catheterized -- scans showed that his bladder was not draining and backing up into his kidneys. After 12 days they attempted to remove this cath but he was still unable to urinate so he remains on indwelling cath today -- failed TOV 5 hrs post cath removal. He has been on flomax since his hospitalization and has been on lasix since last March. He is using wheel chair today due to reported weakness and fatigue that has been improving steadily since its onset at his hospitalization.   10.13.2020: He returns having failed repeat TOV. Following removal of his cath he was able to urinate a small volume but notes he had to strain quite a bit. When he returned the next day, 700 mL's of urine was drained with catheterization -- he denies any pain or discomfort with this residual volume. He now remains on cath.    Past Medical History:  Diagnosis Date  . Arthritis   . Diabetes mellitus without complication ([place])   . Foot ulcer ([place])   . Hypertension     Past Surgical History:  Procedure Laterality Date  . ANKLE SURGERY    . CHOLECYSTECTOMY    . FOOT SURGERY      Home Medications:  Allergies as of 06/17/2019      Reactions   Dust Mite Extract Itching, Other (See Comments)   Unknown  reaction-potential shortness of breath   Prednisone Nausea And Vomiting   Rocephin [ceftriaxone] Nausea And Vomiting      Medication List       Accurate as of June 17, 2019 11:42 AM. If you have any questions, ask your nurse or doctor.        STOP taking these medications   senna-docusate 8.6-50 MG tablet Commonly known as: Senokot-S Stopped by: Jorja Loa, MD     TAKE these medications   Accu-Chek SmartView test strip Generic drug: glucose blood TEST BLOOD SUGAR THREE TIMES DAILY   Accu-Chek SmartView test strip Generic drug: glucose blood   acetaminophen 325 MG tablet Commonly known as: TYLENOL Take 650 mg by mouth as needed for mild pain or moderate pain.   allopurinol 300 MG tablet Commonly known as: ZYLOPRIM Take 1 tablet (300 mg total) by mouth daily.   amLODipine 10 MG tablet Commonly known as: NORVASC Take 1 tablet (10 mg total) by mouth at bedtime.   ascorbic acid 500 MG tablet Commonly known as: VITAMIN C Take by mouth.   Brodalumab 210 MG/1.5ML Sosy Inject 210 mg into the skin every 14 (fourteen) days.   cephALEXin 500 MG capsule Commonly known as: KEFLEX   doxycycline 100 MG tablet Commonly known as: ADOXA   Droplet Insulin Syringe 31G X 5/16" 1 ML Misc Generic drug: Insulin Syringe-Needle  U-100   ECZEMA MOISTURIZING EX Apply 1 application topically daily as needed (applied to legs).   feeding supplement (PRO-STAT SUGAR FREE 64) Liqd Take 30 mLs by mouth 2 (two) times daily between meals.   Fish Oil 1000 MG Caps Take by mouth.   fluticasone 50 MCG/ACT nasal spray Commonly known as: FLONASE USE 2 SPRAY(S) IN EACH NOSTRIL ONCE DAILY   Fluzone Quadrivalent 0.5 ML injection Generic drug: influenza vac split quadrivalent PF Fluzone Quad 2019-2020 (PF) 60 mcg (15 mcg x 4)/0.5 mL IM syringe  PHARMACIST ADMINISTERED IMMUNIZATION ADMINISTERED AT TIME OF DISPENSING   folic acid 1 MG tablet Commonly known as: FOLVITE Take 1  tablet (1 mg total) by mouth daily.   linagliptin 5 MG Tabs tablet Commonly known as: Tradjenta Take 1 tablet (5 mg total) by mouth daily.   Melatonin 1 MG Caps Take 1 mg by mouth as needed. For insomnia   metoprolol succinate 100 MG 24 hr tablet Commonly known as: TOPROL-XL Take 1 tablet (100 mg total) by mouth every evening.   NON FORMULARY Diet: Regular, NAS, Consistent Carbohydrate   NovoLIN 70/30 (70-30) 100 UNIT/ML injection Generic drug: insulin NPH-regular Human Novolin 70/30 U-100 Insulin 100 unit/mL subcutaneous suspension   NovoLIN 70/30 ReliOn (70-30) 100 UNIT/ML injection Generic drug: insulin NPH-regular Human SMARTSIG:100 Unit(s) SUB-Q Daily   NovoLOG Mix 70/30 FlexPen (70-30) 100 UNIT/ML FlexPen Generic drug: insulin aspart protamine - aspart Inject 0.3 mLs (30 Units total) into the skin 2 (two) times daily with a meal.   pravastatin 80 MG tablet Commonly known as: PRAVACHOL Take 1 tablet (80 mg total) by mouth every evening.   Santyl ointment Generic drug: collagenase Ointment; 250 unit/gram; topical  Special Instructions: Apply to wounds per treatment orders.   silver nitrate applicators 25-05 % applicator Topical  Special Instructions: Apply to rolled borders of wound to right lateral malleolus daily as needed for epithelized borders. Once A Day - PRN   silver nitrate applicators 39-76 % applicator ; topical  Special Instructions: Apply to rolled borders of wound to left plantar foot daily as needed for epithelized borders. Once A Day - PRN PRN 1   tamsulosin 0.4 MG Caps capsule Commonly known as: FLOMAX Take 1 capsule (0.4 mg total) by mouth daily after supper.   torsemide 20 MG tablet Commonly known as: DEMADEX Take 1 tablet (20 mg total) by mouth daily.       Allergies:  Allergies  Allergen Reactions  . Dust Mite Extract Itching and Other (See Comments)    Unknown reaction-potential shortness of breath  . Prednisone Nausea And  Vomiting  . Rocephin [Ceftriaxone] Nausea And Vomiting    No family history on file.  Social History:  reports that he has never smoked. He has never used smokeless tobacco. He reports that he does not drink alcohol or use drugs.  ROS: A complete review of systems was performed.  All systems are negative except for pertinent findings as noted.  Physical Exam:  Vital signs in last 24 hours: BP 131/68   Pulse 74   Temp (!) 96.7 F (35.9 C)   Ht 5\' 10"  (1.778 m)   BMI 40.89 kg/m  Constitutional:  Alert and oriented, No acute distress Cardiovascular: Regular rate  Respiratory: Normal respiratory effort GI: Abdomen is soft, nontender, nondistended, no abdominal masses. No CVAT.  Genitourinary: Not completed. Lymphatic: No lymphadenopathy Neurologic: Grossly intact, no focal deficits Psychiatric: Normal mood and affect  Laboratory Data:  No results for input(s):  WBC, HGB, HCT, PLT in the last 72 hours.  No results for input(s): NA, K, CL, GLUCOSE, BUN, CALCIUM, CREATININE in the last 72 hours.  Invalid input(s): CO3  No results found for this or any previous visit (from the past 24 hour(s)). No results found for this or any previous visit (from the past 240 hour(s)).  Renal Function: No results for input(s): CREATININE in the last 168 hours. CrCl cannot be calculated (Patient's most recent lab result is older than the maximum 21 days allowed.).  Radiologic Imaging: No results found.  Impression/Assessment:  Given his three recent visits for urgent cath replacement after accidentally removing them, I would advise that he learn CIC to prevent future emergency visits. He has failed all previous voiding trials. It is likely he will be dependent on this indefinitely but he may find he is able to freely void prior to CIC use in some time.   Plan:  1. He was advised to consider starting on CIC -- he will call to return for a teaching visit should he decide to use this in place of  having an indwelling cath.  2. Return in 1 mo for cath change and OV to discuss starting on CIC as replacement for indwelling cath.

## 2019-06-18 DIAGNOSIS — E1122 Type 2 diabetes mellitus with diabetic chronic kidney disease: Secondary | ICD-10-CM | POA: Diagnosis not present

## 2019-06-18 DIAGNOSIS — E559 Vitamin D deficiency, unspecified: Secondary | ICD-10-CM | POA: Diagnosis not present

## 2019-06-18 DIAGNOSIS — D631 Anemia in chronic kidney disease: Secondary | ICD-10-CM | POA: Diagnosis not present

## 2019-06-18 DIAGNOSIS — E1129 Type 2 diabetes mellitus with other diabetic kidney complication: Secondary | ICD-10-CM | POA: Diagnosis not present

## 2019-06-18 DIAGNOSIS — I890 Lymphedema, not elsewhere classified: Secondary | ICD-10-CM | POA: Diagnosis not present

## 2019-06-18 DIAGNOSIS — R809 Proteinuria, unspecified: Secondary | ICD-10-CM | POA: Diagnosis not present

## 2019-06-18 DIAGNOSIS — N189 Chronic kidney disease, unspecified: Secondary | ICD-10-CM | POA: Diagnosis not present

## 2019-06-18 DIAGNOSIS — N170 Acute kidney failure with tubular necrosis: Secondary | ICD-10-CM | POA: Diagnosis not present

## 2019-06-18 DIAGNOSIS — L89512 Pressure ulcer of right ankle, stage 2: Secondary | ICD-10-CM | POA: Diagnosis not present

## 2019-06-23 DIAGNOSIS — E11621 Type 2 diabetes mellitus with foot ulcer: Secondary | ICD-10-CM | POA: Diagnosis not present

## 2019-06-23 DIAGNOSIS — L97429 Non-pressure chronic ulcer of left heel and midfoot with unspecified severity: Secondary | ICD-10-CM | POA: Diagnosis not present

## 2019-06-23 DIAGNOSIS — E1122 Type 2 diabetes mellitus with diabetic chronic kidney disease: Secondary | ICD-10-CM | POA: Diagnosis not present

## 2019-06-23 DIAGNOSIS — L97319 Non-pressure chronic ulcer of right ankle with unspecified severity: Secondary | ICD-10-CM | POA: Diagnosis not present

## 2019-06-23 DIAGNOSIS — E1151 Type 2 diabetes mellitus with diabetic peripheral angiopathy without gangrene: Secondary | ICD-10-CM | POA: Diagnosis not present

## 2019-06-23 DIAGNOSIS — M1030 Gout due to renal impairment, unspecified site: Secondary | ICD-10-CM | POA: Diagnosis not present

## 2019-06-23 DIAGNOSIS — I5032 Chronic diastolic (congestive) heart failure: Secondary | ICD-10-CM | POA: Diagnosis not present

## 2019-06-23 DIAGNOSIS — I130 Hypertensive heart and chronic kidney disease with heart failure and stage 1 through stage 4 chronic kidney disease, or unspecified chronic kidney disease: Secondary | ICD-10-CM | POA: Diagnosis not present

## 2019-06-23 DIAGNOSIS — N183 Chronic kidney disease, stage 3 unspecified: Secondary | ICD-10-CM | POA: Diagnosis not present

## 2019-06-24 ENCOUNTER — Telehealth: Payer: Self-pay

## 2019-06-24 ENCOUNTER — Other Ambulatory Visit: Payer: Self-pay

## 2019-06-24 ENCOUNTER — Emergency Department (HOSPITAL_COMMUNITY)
Admission: EM | Admit: 2019-06-24 | Discharge: 2019-06-24 | Disposition: A | Discharge status: Home / Self Care | Payer: Medicare HMO | Attending: Emergency Medicine | Admitting: Emergency Medicine

## 2019-06-24 ENCOUNTER — Encounter (HOSPITAL_COMMUNITY): Payer: Self-pay | Admitting: Emergency Medicine

## 2019-06-24 DIAGNOSIS — Z79899 Other long term (current) drug therapy: Secondary | ICD-10-CM | POA: Diagnosis not present

## 2019-06-24 DIAGNOSIS — N183 Chronic kidney disease, stage 3 unspecified: Secondary | ICD-10-CM | POA: Diagnosis not present

## 2019-06-24 DIAGNOSIS — Z794 Long term (current) use of insulin: Secondary | ICD-10-CM | POA: Diagnosis not present

## 2019-06-24 DIAGNOSIS — T839XXA Unspecified complication of genitourinary prosthetic device, implant and graft, initial encounter: Secondary | ICD-10-CM | POA: Insufficient documentation

## 2019-06-24 DIAGNOSIS — R339 Retention of urine, unspecified: Secondary | ICD-10-CM | POA: Diagnosis present

## 2019-06-24 DIAGNOSIS — E1122 Type 2 diabetes mellitus with diabetic chronic kidney disease: Secondary | ICD-10-CM | POA: Insufficient documentation

## 2019-06-24 DIAGNOSIS — I129 Hypertensive chronic kidney disease with stage 1 through stage 4 chronic kidney disease, or unspecified chronic kidney disease: Secondary | ICD-10-CM | POA: Diagnosis not present

## 2019-06-24 DIAGNOSIS — Y732 Prosthetic and other implants, materials and accessory gastroenterology and urology devices associated with adverse incidents: Secondary | ICD-10-CM | POA: Diagnosis not present

## 2019-06-24 DIAGNOSIS — T83098A Other mechanical complication of other indwelling urethral catheter, initial encounter: Secondary | ICD-10-CM | POA: Diagnosis not present

## 2019-06-24 HISTORY — DX: Retention of urine, unspecified: R33.9

## 2019-06-24 LAB — URINALYSIS, ROUTINE W REFLEX MICROSCOPIC
Bilirubin Urine: NEGATIVE
Glucose, UA: 50 mg/dL — AB
Hgb urine dipstick: NEGATIVE
Ketones, ur: NEGATIVE mg/dL
Leukocytes,Ua: NEGATIVE
Nitrite: NEGATIVE
Protein, ur: 100 mg/dL — AB
Specific Gravity, Urine: 1.011 (ref 1.005–1.030)
pH: 6 (ref 5.0–8.0)

## 2019-06-24 NOTE — ED Provider Notes (Signed)
[facility] EMERGENCY DEPARTMENT Provider Note   CSN: [id number] Arrival date & time: 06/24/19  1550    History Chief Complaint  Patient presents with  . Urinary Retention    Danny [name] is a 63 y.o. male with history significant for diabetes, hypertension, cystitis with chronic indwelling Foley catheter, followed by [facility] presents for evaluation of Foley catheter leakage.  Patient states he was seen by [facility] last week.  Had a voiding trial however failed this and needed replacement of his Foley catheter.  Patient states he feels like this is not draining properly.  He has noticed decreased output in his bag and as well as leakage around his urethral meatus.  Denies hematuria.  Admits to some suprapubic fullness and tenderness which he relates to not being able to empty his bladder.  Denies fever, chills, nausea, vomiting, chest pain, shortness of breath, hematuria, diarrhea, constipation, rashes or lesions.  Denies additional aggravating or alleviating factors.  Has not take anything for symptoms.  Initial intake note from front desk states CP however patient adamantly denies this to triage nurse and myself. States "im here because my bag is clogged."  History obtained from patient and past medical records.  No interpreter is used. HPI     Past Medical History:  Diagnosis Date  . Arthritis   . Diabetes mellitus without complication ([place])   . Foot ulcer ([place])   . Hypertension   . Urinary retention     Patient Active Problem List   Diagnosis Date Noted  . Anasarca 03/23/2019  . Bladder outlet obstruction 03/23/2019  . Yeast infection of the skin 03/16/2019  . Diabetic ulcer of left foot ([place]) 03/15/2019  . Diabetic ulcer of right ankle ([place]) 03/15/2019  . Hypertension associated with stage 3 chronic kidney disease due to type 2 diabetes mellitus ([place]) 03/09/2019  . Controlled type 2 diabetes mellitus with stage 3 chronic kidney disease, with long-term  current use of insulin ([place]) 03/09/2019  . Dyslipidemia associated with type 2 diabetes mellitus ([place]) 03/09/2019  . Chronic gout due to renal impairment without tophus 03/09/2019  . Emphysematous cystitis 03/05/2019  . Bilateral hydronephrosis   . Bilateral cellulitis of lower leg 03/03/2019  . Acute urinary retention 03/03/2019  . Klebsiella Cystitis 03/03/2019  . UTI due to Klebsiella species 03/03/2019  . Plantar ulcer of left foot ([place]) 03/03/2019  . Acute on chronic diastolic (congestive) heart failure ([place]) 02/25/2019  . Acute respiratory failure with hypoxia ([place]) 02/25/2019  . Acute renal failure superimposed on stage 3 chronic kidney disease ([place]) 02/24/2019  . Acute CHF (congestive heart failure) ([place]) 02/24/2019  . Dyspnea 02/23/2019  . Essential hypertension 02/23/2019  . Diabetes mellitus ([place]) 02/23/2019  . CKD (chronic kidney disease) 02/23/2019  . Psoriasis 02/23/2019    Past Surgical History:  Procedure Laterality Date  . ANKLE SURGERY    . CHOLECYSTECTOMY    . FOOT SURGERY         History reviewed. No pertinent family history.  Social History   Tobacco Use  . Smoking status: Never Smoker  . Smokeless tobacco: Never Used  Substance Use Topics  . Alcohol use: No  . Drug use: No    Home Medications Prior to Admission medications   Medication Sig Start Date End Date Taking? Authorizing Provider  ACCU-CHEK SMARTVIEW test strip  05/15/19   [provider]  acetaminophen (TYLENOL) 325 MG tablet Take 650 mg by mouth as needed for mild pain or moderate pain.  [provider]  allopurinol (ZYLOPRIM) 300 MG tablet Take 1 tablet (300 mg total) by mouth daily. 03/20/19   Gerlene Fee, NP  Amino Acids-Protein Hydrolys (FEEDING SUPPLEMENT, PRO-STAT SUGAR FREE 64,) LIQD Take 30 mLs by mouth 2 (two) times daily between meals.    [provider]  amLODipine (NORVASC) 10 MG tablet Take 1 tablet (10 mg total) by mouth at bedtime.  03/20/19   Gerlene Fee, NP  ascorbic acid (VITAMIN C) 500 MG tablet Take by mouth.    [provider]  Brodalumab 210 MG/1.5ML SOSY Inject 210 mg into the skin every 14 (fourteen) days. 12/25/17   [provider]  cephALEXin (KEFLEX) 500 MG capsule  06/11/19   [provider]  collagenase (SANTYL) ointment Ointment; 250 unit/gram; topical  Special Instructions: Apply to wounds per treatment orders.    [provider]  Colloidal Oatmeal (ECZEMA MOISTURIZING EX) Apply 1 application topically daily as needed (applied to legs).    [provider]  doxycycline (ADOXA) 100 MG tablet  05/30/19   [provider]  [place] X 5/16" 1 ML [place]  04/28/19   [provider]  fluticasone (FLONASE) 50 MCG/ACT nasal spray USE 2 SPRAY(S) IN EACH NOSTRIL ONCE DAILY 02/11/19   [provider]  folic acid (FOLVITE) 1 MG tablet Take 1 tablet (1 mg total) by mouth daily. 03/20/19   Gerlene Fee, NP  glucose blood (ACCU-CHEK SMARTVIEW) test strip TEST BLOOD SUGAR THREE TIMES DAILY 05/15/19   [provider]  influenza vac split quadrivalent PF (FLUZONE QUADRIVALENT) 0.5 ML injection Fluzone Quad 2019-2020 (PF) 60 mcg (15 mcg x 4)/0.5 mL IM syringe  PHARMACIST ADMINISTERED IMMUNIZATION ADMINISTERED AT TIME OF DISPENSING    [provider]  insulin aspart protamine - aspart (NOVOLOG MIX 70/30 FLEXPEN) (70-30) 100 UNIT/ML FlexPen Inject 0.3 mLs (30 Units total) into the skin 2 (two) times daily with a meal. 03/20/19   Nyoka Cowden, Phylis Bougie, NP  insulin NPH-regular Human (NOVOLIN 70/30) (70-30) 100 UNIT/ML injection Novolin 70/30 U-100 Insulin 100 unit/mL subcutaneous suspension    [provider]  linagliptin (TRADJENTA) 5 MG TABS tablet Take 1 tablet (5 mg total) by mouth daily. 03/20/19   Gerlene Fee, NP  Melatonin 1 MG CAPS Take 1 mg by mouth as needed. For insomnia    [provider]  metoprolol  succinate (TOPROL-XL) 100 MG 24 hr tablet Take 1 tablet (100 mg total) by mouth every evening. 03/20/19   Nyoka Cowden, Phylis Bougie, NP  NON FORMULARY Diet: Regular, NAS, Consistent Carbohydrate    [provider]  NOVOLIN 70/30 RELION (70-30) 100 UNIT/ML injection SMARTSIG:100 Unit(s) SUB-Q Daily 05/01/19   [provider]  Omega-3 Fatty Acids (FISH OIL) 1000 MG CAPS Take by mouth.    [provider]  pravastatin (PRAVACHOL) 80 MG tablet Take 1 tablet (80 mg total) by mouth every evening. 03/20/19   Gerlene Fee, NP  silver nitrate applicators 33-35 % applicator Topical  Special Instructions: Apply to rolled borders of wound to right lateral malleolus daily as needed for epithelized borders. Once A Day - PRN    [provider]  silver nitrate applicators 45-62 % applicator ; topical  Special Instructions: Apply to rolled borders of wound to left plantar foot daily as needed for epithelized borders. Once A Day - PRN PRN 1    [provider]  tamsulosin (FLOMAX) 0.4 MG CAPS capsule Take 1 capsule (0.4 mg  total) by mouth daily after supper. 03/20/19   Gerlene Fee, NP  torsemide (DEMADEX) 20 MG tablet Take 1 tablet (20 mg total) by mouth daily. 03/20/19   Gerlene Fee, NP    Allergies    Dust mite extract, Prednisone, and Rocephin [ceftriaxone]  Review of Systems   Review of Systems  Constitutional: Negative.   HENT: Negative.   Respiratory: Negative.   Cardiovascular: Negative.   Gastrointestinal: Positive for abdominal pain (Suprabupic fullness). Negative for abdominal distention, anal bleeding, blood in stool, constipation, diarrhea, nausea, rectal pain and vomiting.  Genitourinary: Positive for decreased urine volume. Negative for discharge, genital sores, hematuria, penile pain, penile swelling, scrotal swelling, testicular pain and urgency.  Musculoskeletal: Negative.   Skin: Negative.   Neurological: Negative.   All other systems reviewed  and are negative.   Physical Exam Updated Vital Signs BP (!) 124/53 (BP Location: Right Arm)   Pulse 74   Temp 97.8 F (36.6 C) (Oral)   Resp 18   Ht 5\' 10"  (1.778 m)   Wt (!) 142.9 kg   SpO2 94%   BMI 45.20 kg/m   Physical Exam Vitals and nursing note reviewed.  Constitutional:      General: He is not in acute distress.    Appearance: He is well-developed. He is obese. He is not ill-appearing, toxic-appearing or diaphoretic.  HENT:     Head: Normocephalic and atraumatic.     Nose: Nose normal.     Mouth/Throat:     Mouth: Mucous membranes are moist.  Eyes:     Pupils: Pupils are equal, round, and reactive to light.  Cardiovascular:     Rate and Rhythm: Normal rate and regular rhythm.     Pulses: Normal pulses.     Heart sounds: Normal heart sounds.  Pulmonary:     Effort: Pulmonary effort is normal. No respiratory distress.  Abdominal:     General: Bowel sounds are normal. There is no distension.     Palpations: Abdomen is soft.     Tenderness: There is abdominal tenderness (Suprapubic tenderness to palpation. No focal RLQ, LLQ pain). There is no right CVA tenderness, left CVA tenderness, guarding or rebound.  Musculoskeletal:        General: Normal range of motion.     Cervical back: Normal range of motion and neck supple.     Comments: Moves all 4 extremities without difficulty.  Skin:    General: Skin is warm and dry.     Capillary Refill: Capillary refill takes less than 2 seconds.     Comments: Brisk capillary refill.  Neurological:     Mental Status: He is alert.    ED Results / Procedures / Treatments   Labs (all labs ordered are listed, but only abnormal results are displayed) Labs Reviewed  URINALYSIS, ROUTINE W REFLEX MICROSCOPIC - Abnormal; Notable for the following components:      Result Value   Glucose, UA 50 (*)    Protein, ur 100 (*)    Bacteria, UA RARE (*)    All other components within normal limits  URINE CULTURE     EKG None  Radiology No results found.  Procedures Procedures (including critical care time)  Medications Ordered in ED Medications - No data to display  ED Course  I have reviewed the triage vital signs and the nursing notes.  Pertinent labs & imaging results that were available during my care of the patient were reviewed by me and considered  in my medical decision making (see chart for details).  63 year old presents for evaluation of the foley catheter not dwelling.  He is [facility] by [facility].  Chronic indwelling Foley catheter.  Had voiding trial last week with [facility] however failed.  Patient with suprapubic fullness and tenderness palpation.  Has noticed leaking around his urethral meatus and decreased output in his leg bag.  No systemic symptoms.  He is tolerating p.o. intake without difficulty.  Initial secretary noted on intake notes patient here for chest pain however patient is adamant that he does not have chest pain.  He is compliant with his medications for his CHF.  Does have some lower extremity edema venous stasis skin changes however patient states this is at baseline.  He is laying almost flat on the stretcher without any dyspnea.  Currently in hallway bed on additional evaluation however he will be placed into a room for bladder scan, Foley catheter change, urinalysis.  Foley cath changed and is not free flowing. No hematuria. UA sent. Abd soft, non tender after foley replaced. UA negative for infection.  Patient has follow up appointment with Urology tomorrow.  To be [facility] with close outpatient follow-up.  Continues to have a benign nonsurgical abdomen.  Patient seen and evaluated, Dr. Sabra Heck agrees with the treatment, plan and disposition.  The patient has been appropriately medically screened and/or stabilized in the ED. I have low suspicion for any other emergent medical condition which would require further screening, evaluation or treatment  in the ED or require inpatient management.     MDM Rules/Calculators/A&P                       Final Clinical Impression(s) / ED Diagnoses Final diagnoses:  Problem with Foley catheter, initial encounter [facility])    Rx / DC Orders ED Discharge Orders    None       Tierre Netto A, PA-C 06/24/19 2205    Noemi Chapel, MD 06/25/19 1025    Noemi Chapel, MD 06/25/19 1026

## 2019-06-24 NOTE — ED Triage Notes (Signed)
PT states he had his urinary catheter changed by [facility] last week. PT states the catheter feels like it is not in properly and hasn't been draining urine properly. PT states urine has been leaking around the catheter.

## 2019-06-24 NOTE — Discharge Instructions (Addendum)
Follow up with Urology. You will be called if your urinalysis is positive for infection.

## 2019-06-24 NOTE — Telephone Encounter (Signed)
Pt called today c/o foley leaking. Symptoms started two days ago and are worsening. Pt had called Dr. Diona Fanti office in [place] as well and was noted in [place] and message sent to Dr. Diona Fanti for possible bladder spasms. No instructions received yet from MD- added pt to nurse visit schedule tomorrow to assess catheter. Pt voiced understanding and will come into the office tomorrow.

## 2019-06-25 ENCOUNTER — Ambulatory Visit: Payer: Medicare HMO

## 2019-06-26 ENCOUNTER — Encounter (HOSPITAL_COMMUNITY): Payer: Self-pay

## 2019-06-26 ENCOUNTER — Emergency Department (HOSPITAL_COMMUNITY)
Admission: EM | Admit: 2019-06-26 | Discharge: 2019-06-26 | Disposition: A | Discharge status: Home / Self Care | Payer: Medicare HMO | Attending: Emergency Medicine | Admitting: Emergency Medicine

## 2019-06-26 ENCOUNTER — Other Ambulatory Visit: Payer: Self-pay

## 2019-06-26 ENCOUNTER — Emergency Department (HOSPITAL_COMMUNITY): Payer: Medicare HMO

## 2019-06-26 DIAGNOSIS — R0602 Shortness of breath: Secondary | ICD-10-CM | POA: Diagnosis not present

## 2019-06-26 DIAGNOSIS — I5032 Chronic diastolic (congestive) heart failure: Secondary | ICD-10-CM | POA: Diagnosis not present

## 2019-06-26 DIAGNOSIS — Z20828 Contact with and (suspected) exposure to other viral communicable diseases: Secondary | ICD-10-CM | POA: Diagnosis not present

## 2019-06-26 DIAGNOSIS — N189 Chronic kidney disease, unspecified: Secondary | ICD-10-CM | POA: Insufficient documentation

## 2019-06-26 DIAGNOSIS — R0989 Other specified symptoms and signs involving the circulatory and respiratory systems: Secondary | ICD-10-CM | POA: Diagnosis not present

## 2019-06-26 DIAGNOSIS — I130 Hypertensive heart and chronic kidney disease with heart failure and stage 1 through stage 4 chronic kidney disease, or unspecified chronic kidney disease: Secondary | ICD-10-CM | POA: Diagnosis not present

## 2019-06-26 DIAGNOSIS — R531 Weakness: Secondary | ICD-10-CM | POA: Diagnosis not present

## 2019-06-26 DIAGNOSIS — Z794 Long term (current) use of insulin: Secondary | ICD-10-CM | POA: Insufficient documentation

## 2019-06-26 DIAGNOSIS — Z79899 Other long term (current) drug therapy: Secondary | ICD-10-CM | POA: Diagnosis not present

## 2019-06-26 DIAGNOSIS — E1122 Type 2 diabetes mellitus with diabetic chronic kidney disease: Secondary | ICD-10-CM | POA: Insufficient documentation

## 2019-06-26 LAB — CBC
HCT: 24.2 % — ABNORMAL LOW (ref 39.0–52.0)
Hemoglobin: 7.7 g/dL — ABNORMAL LOW (ref 13.0–17.0)
MCH: 28.5 pg (ref 26.0–34.0)
MCHC: 31.8 g/dL (ref 30.0–36.0)
MCV: 89.6 fL (ref 80.0–100.0)
Platelets: 179 10*3/uL (ref 150–400)
RBC: 2.7 MIL/uL — ABNORMAL LOW (ref 4.22–5.81)
RDW: 17.9 % — ABNORMAL HIGH (ref 11.5–15.5)
WBC: 8.8 10*3/uL (ref 4.0–10.5)
nRBC: 0.5 % — ABNORMAL HIGH (ref 0.0–0.2)

## 2019-06-26 LAB — URINE CULTURE: Culture: 10000 — AB

## 2019-06-26 LAB — BASIC METABOLIC PANEL
Anion gap: 8 (ref 5–15)
BUN: 51 mg/dL — ABNORMAL HIGH (ref 8–23)
CO2: 23 mmol/L (ref 22–32)
Calcium: 8.4 mg/dL — ABNORMAL LOW (ref 8.9–10.3)
Chloride: 106 mmol/L (ref 98–111)
Creatinine, Ser: 3.1 mg/dL — ABNORMAL HIGH (ref 0.61–1.24)
GFR calc Af Amer: 23 mL/min — ABNORMAL LOW (ref >60)
GFR calc non Af Amer: 20 mL/min — ABNORMAL LOW (ref >60)
Glucose, Bld: 221 mg/dL — ABNORMAL HIGH (ref 70–99)
Potassium: 4 mmol/L (ref 3.5–5.1)
Sodium: 137 mmol/L (ref 135–145)

## 2019-06-26 LAB — BRAIN NATRIURETIC PEPTIDE: B Natriuretic Peptide: 130 pg/mL — ABNORMAL HIGH (ref 0.0–100.0)

## 2019-06-26 NOTE — ED Provider Notes (Signed)
Medical screening examination/treatment/procedure(s) were conducted as a shared visit with non-physician practitioner(s) and myself.  I personally evaluated the patient during the encounter.  EKG Interpretation  Date/Time:  Thursday June 26 2019 10:48:18 EST Ventricular Rate:  65 PR Interval:    QRS Duration: 89 QT Interval:  446 QTC Calculation: 464 R Axis:   67 Text Interpretation: Sinus rhythm Short PR interval Low voltage, precordial leads Nonspecific T abnormalities, lateral leads Confirmed by Fredia Sorrow [phone]) on 06/26/2019 11:51:50 AM   Results for orders placed or performed during the hospital encounter of 46/96/29  Basic metabolic panel  Result Value Ref Range   Sodium 137 135 - 145 mmol/L   Potassium 4.0 3.5 - 5.1 mmol/L   Chloride 106 98 - 111 mmol/L   CO2 23 22 - 32 mmol/L   Glucose, Bld 221 (H) 70 - 99 mg/dL   BUN 51 (H) 8 - 23 mg/dL   Creatinine, Ser 3.10 (H) 0.61 - 1.24 mg/dL   Calcium 8.4 (L) 8.9 - 10.3 mg/dL   GFR calc non Af Amer 20 (L) >60 mL/min   GFR calc Af Amer 23 (L) >60 mL/min   Anion gap 8 5 - 15  CBC  Result Value Ref Range   WBC 8.8 4.0 - 10.5 K/uL   RBC 2.70 (L) 4.22 - 5.81 MIL/uL   Hemoglobin 7.7 (L) 13.0 - 17.0 g/dL   HCT 24.2 (L) 39.0 - 52.0 %   MCV 89.6 80.0 - 100.0 fL   MCH 28.5 26.0 - 34.0 pg   MCHC 31.8 30.0 - 36.0 g/dL   RDW 17.9 (H) 11.5 - 15.5 %   Platelets 179 150 - 400 K/uL   nRBC 0.5 (H) 0.0 - 0.2 %  Brain natriuretic peptide  Result Value Ref Range   B Natriuretic Peptide 130.0 (H) 0.0 - 100.0 pg/mL   DG Chest Port 1 View  Result Date: 06/26/2019 CLINICAL DATA:  Shortness of breath EXAM: PORTABLE CHEST 1 VIEW COMPARISON:  02/23/2019 FINDINGS: Cardiomegaly and vascular pedicle widening. Small pleural effusions. No pulmonary edema or air bronchogram. No pneumothorax. IMPRESSION: Cardiomegaly and small pleural effusions. Electronically Signed   By: Monte Fantasia M.D.   On: 06/26/2019 11:41    Patient seen by me  along with physician assistant.  Patient with a longstanding history of CHF and lower extremity edema.  Chronically.  Patient feels for the last few days that he has had some worsening shortness of breath with exertion.  Work-up here today no leukocytosis renal function is baseline no significant electrolyte abnormalities.  BNP is only at 130.  Chest x-ray perhaps with some small pleural effusions but no evidence of any significant CHF.  Patient with weeping edema in both lower extremities.  Normally has dressings placed by his primary care doctor [place] has been able to get into see him for a while.  We will have the nurses redressed that.  Will recommend that we ambulate him with a pulse ox if there is significant drop in oxygen with that then he may require admission.  Patient not a candidate for CTA even though clinically I do not think that this is cardiac ischemic event or pulmonary embolus.  But if he drops his sats significantly may be he will need a VQ scan   Fredia Sorrow, MD 06/26/19 1616

## 2019-06-26 NOTE — ED Triage Notes (Signed)
Pt reports was diagnosed with chf in aug.  Pt says since christmas eve he's been having swelling in lower legs, sob, fatigue.  Reports was here recently for foley problems.  EMS says bp 130/80, NSR on monitor, cbg 270.  EMS reports lungs sound diminished in lower lobes and sob worse with exertion.  Pt has indwelling foley.  Weeping edema noted to bilateral lower legs.  EMS started 20g iv in left ac.

## 2019-06-26 NOTE — Discharge Instructions (Addendum)
Continue your current medication as directed.  Be sure to follow-up with Dr. Juel Burrow office on Monday.  Return to the ER if you develop any worsening symptoms

## 2019-06-26 NOTE — ED Notes (Signed)
Bedside commode brought to patient's bedside.   Patient had taken his blood pressure cuff off arm.  Advised patient to call out when he was finished with toilet needs, that we must keep blood pressure cuff on at all times.

## 2019-06-26 NOTE — ED Provider Notes (Signed)
[facility] EMERGENCY DEPARTMENT Provider Note   CSN: [id number] Arrival date & time: 06/26/19  1033     History Chief Complaint  Patient presents with  . Shortness of Breath  . Weakness    Danny [name] is a 63 y.o. male.  HPI      Danny [name] is a 63 y.o. male with past medical history of diabetes, hypertension, CKD, CHF and urinary retention.  Who presents to the Emergency Department complaining of increasing shortness of breath since Christmas eve.  He states that his lower legs have been swelling more than usual and he endorses dyspnea on exertion with fatigue and feels as though his heart is racing.  He states that he was diagnosed with CHF in August and his current symptoms feel similar to previous.  He states he has been taking his diuretics regularly without significant output.  He denies chest pain, abdominal pain, nausea vomiting, fever or chills.  No known Covid exposures.  Past Medical History:  Diagnosis Date  . Arthritis   . Diabetes mellitus without complication ([place])   . Foot ulcer ([place])   . Hypertension   . Urinary retention     Patient Active Problem List   Diagnosis Date Noted  . Anasarca 03/23/2019  . Bladder outlet obstruction 03/23/2019  . Yeast infection of the skin 03/16/2019  . Diabetic ulcer of left foot ([place]) 03/15/2019  . Diabetic ulcer of right ankle ([place]) 03/15/2019  . Hypertension associated with stage 3 chronic kidney disease due to type 2 diabetes mellitus ([place]) 03/09/2019  . Controlled type 2 diabetes mellitus with stage 3 chronic kidney disease, with long-term current use of insulin ([place]) 03/09/2019  . Dyslipidemia associated with type 2 diabetes mellitus ([place]) 03/09/2019  . Chronic gout due to renal impairment without tophus 03/09/2019  . Emphysematous cystitis 03/05/2019  . Bilateral hydronephrosis   . Bilateral cellulitis of lower leg 03/03/2019  . Acute urinary retention 03/03/2019  . Klebsiella Cystitis 03/03/2019  . UTI due to  Klebsiella species 03/03/2019  . Plantar ulcer of left foot ([place]) 03/03/2019  . Acute on chronic diastolic (congestive) heart failure ([place]) 02/25/2019  . Acute respiratory failure with hypoxia ([place]) 02/25/2019  . Acute renal failure superimposed on stage 3 chronic kidney disease ([place]) 02/24/2019  . Acute CHF (congestive heart failure) ([place]) 02/24/2019  . Dyspnea 02/23/2019  . Essential hypertension 02/23/2019  . Diabetes mellitus ([place]) 02/23/2019  . CKD (chronic kidney disease) 02/23/2019  . Psoriasis 02/23/2019    Past Surgical History:  Procedure Laterality Date  . ANKLE SURGERY    . CHOLECYSTECTOMY    . FOOT SURGERY         No family history on file.  Social History   Tobacco Use  . Smoking status: Never Smoker  . Smokeless tobacco: Never Used  Substance Use Topics  . Alcohol use: No  . Drug use: No    Home Medications Prior to Admission medications   Medication Sig Start Date End Date Taking? Authorizing Provider  ACCU-CHEK SMARTVIEW test strip  05/15/19   [provider]  acetaminophen (TYLENOL) 325 MG tablet Take 650 mg by mouth as needed for mild pain or moderate pain.     [provider]  allopurinol (ZYLOPRIM) 300 MG tablet Take 1 tablet (300 mg total) by mouth daily. 03/20/19   Gerlene Fee, NP  Amino Acids-Protein Hydrolys (FEEDING SUPPLEMENT, PRO-STAT SUGAR FREE 64,) LIQD Take 30 mLs by mouth 2 (two) times daily between meals.  [provider]  amLODipine (NORVASC) 10 MG tablet Take 1 tablet (10 mg total) by mouth at bedtime. 03/20/19   Gerlene Fee, NP  ascorbic acid (VITAMIN C) 500 MG tablet Take by mouth.    [provider]  Brodalumab 210 MG/1.5ML SOSY Inject 210 mg into the skin every 14 (fourteen) days. 12/25/17   [provider]  cephALEXin (KEFLEX) 500 MG capsule  06/11/19   [provider]  collagenase (SANTYL) ointment Ointment; 250 unit/gram; topical  Special Instructions: Apply to wounds  per treatment orders.    [provider]  Colloidal Oatmeal (ECZEMA MOISTURIZING EX) Apply 1 application topically daily as needed (applied to legs).    [provider]  doxycycline (ADOXA) 100 MG tablet  05/30/19   [provider]  [place] X 5/16" 1 ML [place]  04/28/19   [provider]  fluticasone (FLONASE) 50 MCG/ACT nasal spray USE 2 SPRAY(S) IN EACH NOSTRIL ONCE DAILY 02/11/19   [provider]  folic acid (FOLVITE) 1 MG tablet Take 1 tablet (1 mg total) by mouth daily. 03/20/19   Gerlene Fee, NP  glucose blood (ACCU-CHEK SMARTVIEW) test strip TEST BLOOD SUGAR THREE TIMES DAILY 05/15/19   [provider]  influenza vac split quadrivalent PF (FLUZONE QUADRIVALENT) 0.5 ML injection Fluzone Quad 2019-2020 (PF) 60 mcg (15 mcg x 4)/0.5 mL IM syringe  PHARMACIST ADMINISTERED IMMUNIZATION ADMINISTERED AT TIME OF DISPENSING    [provider]  insulin aspart protamine - aspart (NOVOLOG MIX 70/30 FLEXPEN) (70-30) 100 UNIT/ML FlexPen Inject 0.3 mLs (30 Units total) into the skin 2 (two) times daily with a meal. 03/20/19   Nyoka Cowden, Phylis Bougie, NP  insulin NPH-regular Human (NOVOLIN 70/30) (70-30) 100 UNIT/ML injection Novolin 70/30 U-100 Insulin 100 unit/mL subcutaneous suspension    [provider]  linagliptin (TRADJENTA) 5 MG TABS tablet Take 1 tablet (5 mg total) by mouth daily. 03/20/19   Gerlene Fee, NP  Melatonin 1 MG CAPS Take 1 mg by mouth as needed. For insomnia    [provider]  metoprolol succinate (TOPROL-XL) 100 MG 24 hr tablet Take 1 tablet (100 mg total) by mouth every evening. 03/20/19   Nyoka Cowden, Phylis Bougie, NP  NON FORMULARY Diet: Regular, NAS, Consistent Carbohydrate    [provider]  NOVOLIN 70/30 RELION (70-30) 100 UNIT/ML injection SMARTSIG:100 Unit(s) SUB-Q Daily 05/01/19   [provider]  Omega-3 Fatty Acids (FISH OIL) 1000 MG CAPS Take by mouth.    [provider]  pravastatin (PRAVACHOL) 80 MG tablet Take 1 tablet (80 mg total) by mouth every evening. 03/20/19   Gerlene Fee, NP  silver nitrate applicators 95-62 % applicator Topical  Special Instructions: Apply to rolled borders of wound to right lateral malleolus daily as needed for epithelized borders. Once A Day - PRN    [provider]  silver nitrate applicators 13-08 % applicator ; topical  Special Instructions: Apply to rolled borders of wound to left plantar foot daily as needed for epithelized borders. Once A Day - PRN PRN 1    [provider]  tamsulosin (FLOMAX) 0.4 MG CAPS capsule Take 1 capsule (0.4 mg total) by mouth daily after supper. 03/20/19   Gerlene Fee, NP  torsemide (DEMADEX) 20 MG tablet Take 1 tablet (20 mg total) by mouth daily. 03/20/19   Gerlene Fee, NP    Allergies    Dust mite extract, Prednisone, and Rocephin [ceftriaxone]  Review of Systems   Review of Systems  Constitutional: Positive for fatigue. Negative for chills and fever.  HENT: Negative for sore throat and trouble swallowing.   Respiratory: Positive for shortness of breath. Negative for cough and wheezing.   Cardiovascular: Positive for leg swelling. Negative for chest pain and palpitations.  Gastrointestinal: Negative for abdominal pain, diarrhea, nausea and vomiting.  Genitourinary: Negative for dysuria and flank pain.  Musculoskeletal: Negative for arthralgias, back pain, myalgias, neck pain and neck stiffness.  Skin: Negative for rash.  Neurological: Negative for dizziness, weakness, numbness and headaches.  Hematological: Does not bruise/bleed easily.    Physical Exam Updated Vital Signs BP 135/67 (BP Location: Right Arm)   Pulse 68   Temp 97.6 F (36.4 C) (Oral)   Resp 20   Ht 6' (1.829 m)   Wt (!) 142.9 kg   SpO2 95%   BMI 42.72 kg/m   Physical Exam Vitals and nursing note reviewed.  Constitutional:      Appearance: Normal appearance. He is  not ill-appearing or toxic-appearing.  HENT:     Mouth/Throat:     Mouth: Mucous membranes are moist.  Eyes:     Pupils: Pupils are equal, round, and reactive to light.  Neck:     Thyroid: No thyromegaly.     Meningeal: Kernig's sign absent.  Cardiovascular:     Rate and Rhythm: Normal rate and regular rhythm.     Pulses: Normal pulses.     Heart sounds: Normal heart sounds.  Pulmonary:     Effort: Pulmonary effort is normal. No respiratory distress.     Breath sounds: Normal breath sounds. No stridor. No wheezing.     Comments: Lungs are clear to auscultation bilaterally.  No respiratory distress noted.  Patient able to speak in a complete sentences without respiratory distress Abdominal:     Palpations: Abdomen is soft.     Tenderness: There is no abdominal tenderness. There is no guarding or rebound.  Genitourinary:    Comments: Patient does have indwelling Foley catheter. Musculoskeletal:        General: Normal range of motion.     Cervical back: Normal range of motion and neck supple.     Right lower leg: Edema present.     Left lower leg: Edema present.     Comments: 2+ pitting edema of the bilateral lower extremities with  mild weeping. No tenderness, erythema or excessive warmth.  Skin:    General: Skin is warm and dry.     Capillary Refill: Capillary refill takes less than 2 seconds.     Findings: No rash.  Neurological:     General: No focal deficit present.     Mental Status: He is alert.     Sensory: Sensation is intact. No sensory deficit.     Motor: Motor function is intact. No weakness.     Coordination: Coordination is intact.     Comments: CN II-XII grossly intact.  Speech clear.     ED Results / Procedures / Treatments   Labs (all labs ordered are listed, but only abnormal results are displayed) Labs Reviewed  BASIC METABOLIC PANEL - Abnormal; Notable for the following components:      Result Value   Glucose, Bld 221 (*)    BUN 51 (*)    Creatinine,  Ser 3.10 (*)    Calcium 8.4 (*)    GFR calc non Af Amer 20 (*)    GFR calc Af Amer 23 (*)  All other components within normal limits  CBC - Abnormal; Notable for the following components:   RBC 2.70 (*)    Hemoglobin 7.7 (*)    HCT 24.2 (*)    RDW 17.9 (*)    nRBC 0.5 (*)    All other components within normal limits  BRAIN NATRIURETIC PEPTIDE - Abnormal; Notable for the following components:   B Natriuretic Peptide 130.0 (*)    All other components within normal limits  SARS CORONAVIRUS 2 (TAT 6-24 HRS)    EKG EKG Interpretation  Date/Time:  Thursday June 26 2019 10:48:18 EST Ventricular Rate:  65 PR Interval:    QRS Duration: 89 QT Interval:  446 QTC Calculation: 464 R Axis:   67 Text Interpretation: Sinus rhythm Short PR interval Low voltage, precordial leads Nonspecific T abnormalities, lateral leads Confirmed by Fredia Sorrow [id number]) on 06/26/2019 11:51:50 AM   Radiology DG Chest Port 1 View  Result Date: 06/26/2019 CLINICAL DATA:  Shortness of breath EXAM: PORTABLE CHEST 1 VIEW COMPARISON:  02/23/2019 FINDINGS: Cardiomegaly and vascular pedicle widening. Small pleural effusions. No pulmonary edema or air bronchogram. No pneumothorax. IMPRESSION: Cardiomegaly and small pleural effusions. Electronically Signed   By: Monte Fantasia M.D.   On: 06/26/2019 11:41    Procedures Procedures (including critical care time)  Medications Ordered in ED Medications - No data to display  ED Course  I have reviewed the triage vital signs and the nursing notes.  Pertinent labs & imaging results that were available during my care of the patient were reviewed by me and considered in my medical decision making (see chart for details).    MDM Rules/Calculators/A&P                      Pt with chronic shortness of breath.  Here today for worsening symptoms.  Work up today is reassuring.  No chest pain, and he is without tachycardia tachypnea or hypoxia.  He is ambulated in  the department and maintains an O2 sat of 94% while ambulating.  He has chronic weeping edema of the bilateral lower extremities.  Currently taking diuretic.  Wounds were dressed by nursing staff.  Patient also seen by Dr. Rogene Houston and care plan discussed.  Patient appropriate for discharge home, will follow up with PCP next week.  Return precautions were also discussed.    Final Clinical Impression(s) / ED Diagnoses Final diagnoses:  SOB (shortness of breath)    Rx / DC Orders ED Discharge Orders    None       Kem Parkinson, PA-C 06/26/19 2112    Fredia Sorrow, MD 06/27/19 [id number]

## 2019-06-26 NOTE — ED Notes (Signed)
Ambulated patient with help of a walker.  Oxygen saturation remained at 94% room air.

## 2019-06-27 LAB — SARS CORONAVIRUS 2 (TAT 6-24 HRS): SARS Coronavirus 2: NEGATIVE

## 2019-06-29 ENCOUNTER — Other Ambulatory Visit: Payer: Self-pay

## 2019-06-29 ENCOUNTER — Telehealth: Payer: Self-pay | Admitting: Emergency Medicine

## 2019-06-29 ENCOUNTER — Encounter (HOSPITAL_COMMUNITY): Payer: Self-pay | Admitting: Emergency Medicine

## 2019-06-29 DIAGNOSIS — T83511A Infection and inflammatory reaction due to indwelling urethral catheter, initial encounter: Secondary | ICD-10-CM | POA: Diagnosis not present

## 2019-06-29 DIAGNOSIS — Z79899 Other long term (current) drug therapy: Secondary | ICD-10-CM | POA: Diagnosis not present

## 2019-06-29 DIAGNOSIS — E1122 Type 2 diabetes mellitus with diabetic chronic kidney disease: Secondary | ICD-10-CM | POA: Insufficient documentation

## 2019-06-29 DIAGNOSIS — I509 Heart failure, unspecified: Secondary | ICD-10-CM | POA: Diagnosis not present

## 2019-06-29 DIAGNOSIS — T83091A Other mechanical complication of indwelling urethral catheter, initial encounter: Secondary | ICD-10-CM | POA: Diagnosis not present

## 2019-06-29 DIAGNOSIS — I130 Hypertensive heart and chronic kidney disease with heart failure and stage 1 through stage 4 chronic kidney disease, or unspecified chronic kidney disease: Secondary | ICD-10-CM | POA: Diagnosis not present

## 2019-06-29 DIAGNOSIS — Z794 Long term (current) use of insulin: Secondary | ICD-10-CM | POA: Diagnosis not present

## 2019-06-29 DIAGNOSIS — Y732 Prosthetic and other implants, materials and accessory gastroenterology and urology devices associated with adverse incidents: Secondary | ICD-10-CM | POA: Insufficient documentation

## 2019-06-29 DIAGNOSIS — T83011A Breakdown (mechanical) of indwelling urethral catheter, initial encounter: Secondary | ICD-10-CM | POA: Insufficient documentation

## 2019-06-29 DIAGNOSIS — N183 Chronic kidney disease, stage 3 unspecified: Secondary | ICD-10-CM | POA: Diagnosis not present

## 2019-06-29 DIAGNOSIS — N390 Urinary tract infection, site not specified: Secondary | ICD-10-CM | POA: Diagnosis not present

## 2019-06-29 NOTE — ED Triage Notes (Signed)
Patient complains that his foley catheter is leaking. He was seen on 06/24/19 for the same and foley was replaced at that time.

## 2019-06-29 NOTE — Telephone Encounter (Signed)
Post ED Visit - Positive Culture Follow-up  Culture report reviewed by antimicrobial stewardship pharmacist: [place] Team []  Elenor Quinones, Pharm.D. []  Heide Guile, Pharm.D., BCPS AQ-ID []  Parks Neptune, Pharm.D., BCPS []  Alycia Rossetti, Pharm.D., BCPS []  [place], Pharm.D., BCPS, AAHIVP []  Legrand Como, Pharm.D., BCPS, AAHIVP []  Salome Arnt, PharmD, BCPS [x]  Johnnette Gourd, PharmD, BCPS []  Hughes Better, PharmD, BCPS []  Leeroy Cha, PharmD []  Laqueta Linden, PharmD, BCPS []  Albertina Parr, PharmD  [place] Team []  Leodis Sias, PharmD []  Lindell Spar, PharmD []  Royetta Asal, PharmD []  Graylin Shiver, Rph []  Rema Fendt) Glennon Mac, PharmD []  Arlyn Dunning, PharmD []  Netta Cedars, PharmD []  Dia Sitter, PharmD []  Leone Haven, PharmD []  Gretta Arab, PharmD []  Theodis Shove, PharmD []  Peggyann Juba, PharmD []  Reuel Boom, PharmD   Positive urine culture no further patient follow-up is required at this time.  Danny [name] 06/29/2019, 11:20 AM

## 2019-06-30 ENCOUNTER — Emergency Department (HOSPITAL_COMMUNITY)
Admission: EM | Admit: 2019-06-30 | Discharge: 2019-06-30 | Disposition: A | Discharge status: Home / Self Care | Payer: Medicare HMO | Attending: Emergency Medicine | Admitting: Emergency Medicine

## 2019-06-30 DIAGNOSIS — N390 Urinary tract infection, site not specified: Secondary | ICD-10-CM

## 2019-06-30 DIAGNOSIS — L89512 Pressure ulcer of right ankle, stage 2: Secondary | ICD-10-CM | POA: Diagnosis not present

## 2019-06-30 DIAGNOSIS — T839XXA Unspecified complication of genitourinary prosthetic device, implant and graft, initial encounter: Secondary | ICD-10-CM

## 2019-06-30 DIAGNOSIS — I890 Lymphedema, not elsewhere classified: Secondary | ICD-10-CM | POA: Diagnosis not present

## 2019-06-30 LAB — URINALYSIS, ROUTINE W REFLEX MICROSCOPIC
Bilirubin Urine: NEGATIVE
Glucose, UA: NEGATIVE mg/dL
Hgb urine dipstick: NEGATIVE
Ketones, ur: NEGATIVE mg/dL
Nitrite: NEGATIVE
Protein, ur: 100 mg/dL — AB
RBC / HPF: >50 RBC/hpf — ABNORMAL HIGH (ref 0–5)
Specific Gravity, Urine: 1.012 (ref 1.005–1.030)
pH: 5 (ref 5.0–8.0)

## 2019-06-30 MED ORDER — SULFAMETHOXAZOLE-TRIMETHOPRIM 800-160 MG PO TABS: 1.0000 | ORAL_TABLET | Freq: Two times a day (BID) | ORAL | 0 refills | Status: DC

## 2019-06-30 MED ORDER — SULFAMETHOXAZOLE-TRIMETHOPRIM 800-160 MG PO TABS
1.0000 | ORAL_TABLET | Freq: Once | ORAL | Status: AC
  Administered 2019-06-30: 1 via ORAL
  Filled 2019-06-30: qty 1

## 2019-06-30 NOTE — ED Provider Notes (Signed)
[facility] EMERGENCY DEPARTMENT Provider Note   CSN: [id number] Arrival date & time: 06/29/19  2200   Time seen 1:02 AM  History Chief Complaint  Patient presents with  . Foley Catheter Leaking    Danny [name] is a 64 y.o. male.  HPI patient states he has had a Foley catheter since about September when he had a very bad urinary tract infection.  He said his Foley was last changed on the 29th after only been and 1 or 2 weeks.  He states it was leaking.  He states he thinks his catheter may have started leaking on the first or second but definitely today.  He states it is leaking around the catheter and coming out of his urethra.  He denies abdominal pain, fever, nausea or vomiting.  He does state the urine does appear to be cloudy.   PCP Celene Squibb, MD [facility]  Past Medical History:  Diagnosis Date  . Arthritis   . Diabetes mellitus without complication ([place])   . Foot ulcer ([place])   . Hypertension   . Urinary retention     Patient Active Problem List   Diagnosis Date Noted  . Anasarca 03/23/2019  . Bladder outlet obstruction 03/23/2019  . Yeast infection of the skin 03/16/2019  . Diabetic ulcer of left foot ([place]) 03/15/2019  . Diabetic ulcer of right ankle ([place]) 03/15/2019  . Hypertension associated with stage 3 chronic kidney disease due to type 2 diabetes mellitus ([place]) 03/09/2019  . Controlled type 2 diabetes mellitus with stage 3 chronic kidney disease, with long-term current use of insulin ([place]) 03/09/2019  . Dyslipidemia associated with type 2 diabetes mellitus ([place]) 03/09/2019  . Chronic gout due to renal impairment without tophus 03/09/2019  . Emphysematous cystitis 03/05/2019  . Bilateral hydronephrosis   . Bilateral cellulitis of lower leg 03/03/2019  . Acute urinary retention 03/03/2019  . Klebsiella Cystitis 03/03/2019  . UTI due to Klebsiella species 03/03/2019  . Plantar ulcer of left foot ([place]) 03/03/2019  . Acute on chronic diastolic  (congestive) heart failure ([place]) 02/25/2019  . Acute respiratory failure with hypoxia ([place]) 02/25/2019  . Acute renal failure superimposed on stage 3 chronic kidney disease ([place]) 02/24/2019  . Acute CHF (congestive heart failure) ([place]) 02/24/2019  . Dyspnea 02/23/2019  . Essential hypertension 02/23/2019  . Diabetes mellitus ([place]) 02/23/2019  . CKD (chronic kidney disease) 02/23/2019  . Psoriasis 02/23/2019    Past Surgical History:  Procedure Laterality Date  . ANKLE SURGERY    . CHOLECYSTECTOMY    . FOOT SURGERY         No family history on file.  Social History   Tobacco Use  . Smoking status: Never Smoker  . Smokeless tobacco: Never Used  Substance Use Topics  . Alcohol use: No  . Drug use: No  lives at home  Home Medications Prior to Admission medications   Medication Sig Start Date End Date Taking? Authorizing Provider  ACCU-CHEK SMARTVIEW test strip 1 each by Other route as needed.  05/15/19   [provider]  acetaminophen (TYLENOL) 325 MG tablet Take 650 mg by mouth as needed for mild pain or moderate pain.     [provider]  allopurinol (ZYLOPRIM) 300 MG tablet Take 1 tablet (300 mg total) by mouth daily. 03/20/19   Gerlene Fee, NP  Amino Acids-Protein Hydrolys (FEEDING SUPPLEMENT, PRO-STAT SUGAR FREE 64,) LIQD Take 30 mLs by mouth 2 (two) times daily between meals.  [provider]  amLODipine (NORVASC) 10 MG tablet Take 1 tablet (10 mg total) by mouth at bedtime. 03/20/19   Gerlene Fee, NP  ascorbic acid (VITAMIN C) 500 MG tablet Take 500 mg by mouth daily.     [provider]  Brodalumab 210 MG/1.5ML SOSY Inject 210 mg into the skin every 14 (fourteen) days. 12/25/17   [provider]  cephALEXin (KEFLEX) 500 MG capsule  06/11/19   [provider]  collagenase (SANTYL) ointment Ointment; 250 unit/gram; topical  Special Instructions: Apply to wounds per treatment orders.    [provider]   Colloidal Oatmeal (ECZEMA MOISTURIZING EX) Apply 1 application topically daily as needed (applied to legs).    [provider]  doxycycline (ADOXA) 100 MG tablet  05/30/19   [provider]  [place] X 5/16" 1 ML [place]  04/28/19   [provider]  fluticasone (FLONASE) 50 MCG/ACT nasal spray Place 2 sprays into both nostrils daily.  02/11/19   [provider]  folic acid (FOLVITE) 1 MG tablet Take 1 tablet (1 mg total) by mouth daily. 03/20/19   Gerlene Fee, NP  insulin aspart protamine - aspart (NOVOLOG MIX 70/30 FLEXPEN) (70-30) 100 UNIT/ML FlexPen Inject 0.3 mLs (30 Units total) into the skin 2 (two) times daily with a meal. 03/20/19   Nyoka Cowden, Phylis Bougie, NP  linagliptin (TRADJENTA) 5 MG TABS tablet Take 1 tablet (5 mg total) by mouth daily. 03/20/19   Gerlene Fee, NP  Melatonin 1 MG CAPS Take 1 mg by mouth as needed. For insomnia    [provider]  metoprolol succinate (TOPROL-XL) 100 MG 24 hr tablet Take 1 tablet (100 mg total) by mouth every evening. 03/20/19   Gerlene Fee, NP  NON FORMULARY Diet: Regular, NAS, Consistent Carbohydrate    [provider]  NOVOLIN 70/30 RELION (70-30) 100 UNIT/ML injection Inject 10 Units into the skin 2 (two) times daily with a meal.  05/01/19   [provider]  Omega-3 Fatty Acids (FISH OIL) 1000 MG CAPS Take by mouth.    [provider]  pravastatin (PRAVACHOL) 80 MG tablet Take 1 tablet (80 mg total) by mouth every evening. 03/20/19   Gerlene Fee, NP  silver nitrate applicators 89-38 % applicator Topical  Special Instructions: Apply to rolled borders of wound to right lateral malleolus daily as needed for epithelized borders. Once A Day - PRN    [provider]  silver nitrate applicators 10-17 % applicator ; topical  Special Instructions: Apply to rolled borders of wound to left plantar foot daily as needed for epithelized borders. Once A Day -  PRN PRN 1    [provider]  sulfamethoxazole-trimethoprim (BACTRIM DS) 800-160 MG tablet Take 1 tablet by mouth 2 (two) times daily. 06/30/19   Rolland Porter, MD  tamsulosin (FLOMAX) 0.4 MG CAPS capsule Take 1 capsule (0.4 mg total) by mouth daily after supper. 03/20/19   Gerlene Fee, NP  torsemide (DEMADEX) 20 MG tablet Take 1 tablet (20 mg total) by mouth daily. 03/20/19   Gerlene Fee, NP    Allergies    Dust mite extract, Prednisone, and Rocephin [ceftriaxone]  Review of Systems   Review of Systems  All other systems reviewed and are negative.   Physical Exam Updated Vital Signs BP (!) 139/43   Pulse (!) 59   Temp 98 F (36.7 C) (Oral)   Resp 20   Ht  6' (1.829 m)   Wt (!) 142.9 kg   SpO2 93%   BMI 42.72 kg/m   Vital signs normal    Physical Exam Vitals and nursing note reviewed.  Constitutional:      Appearance: Normal appearance. He is obese.  HENT:     Head: Normocephalic and atraumatic.  Eyes:     Extraocular Movements: Extraocular movements intact.     Pupils: Pupils are equal, round, and reactive to light.  Cardiovascular:     Rate and Rhythm: Normal rate.  Abdominal:     General: There is distension.     Palpations: There is no mass.     Tenderness: There is no abdominal tenderness.  Genitourinary:    Comments: Foley catheter in the urethra and noted to have cloudy yellow urine in the tube Musculoskeletal:        General: Swelling present.     Cervical back: Normal range of motion.     Comments: Patient has both lower extremities wrapped for presumed edema.  Skin:    General: Skin is warm and dry.  Neurological:     General: No focal deficit present.     Mental Status: He is alert and oriented to person, place, and time.     Cranial Nerves: No cranial nerve deficit.  Psychiatric:        Mood and Affect: Mood normal.        Behavior: Behavior normal.        Thought Content: Thought content normal.     ED Results / Procedures /  Treatments   Labs (all labs ordered are listed, but only abnormal results are displayed) Results for orders placed or performed during the hospital encounter of 06/30/19  Urinalysis, Routine w reflex microscopic  Result Value Ref Range   Color, Urine YELLOW YELLOW   APPearance CLOUDY (A) CLEAR   Specific Gravity, Urine 1.012 1.005 - 1.030   pH 5.0 5.0 - 8.0   Glucose, UA NEGATIVE NEGATIVE mg/dL   Hgb urine dipstick NEGATIVE NEGATIVE   Bilirubin Urine NEGATIVE NEGATIVE   Ketones, ur NEGATIVE NEGATIVE mg/dL   Protein, ur 100 (A) NEGATIVE mg/dL   Nitrite NEGATIVE NEGATIVE   Leukocytes,Ua SMALL (A) NEGATIVE   RBC / HPF >50 (H) 0 - 5 RBC/hpf   WBC, UA 11-20 0 - 5 WBC/hpf   Bacteria, UA RARE (A) NONE SEEN   Squamous Epithelial / LPF 0-5 0 - 5   WBC Clumps PRESENT    Budding Yeast PRESENT    Laboratory interpretation all normal except possible UTI, urine culture sent    EKG None  Radiology No results found.  Procedures Procedures (including critical care time)  Medications Ordered in ED Medications  sulfamethoxazole-trimethoprim (BACTRIM DS) 800-160 MG per tablet 1 tablet (1 tablet Oral Given 06/30/19 0244)    ED Course  I have reviewed the triage vital signs and the nursing notes.  Pertinent labs & imaging results that were available during my care of the patient were reviewed by me and considered in my medical decision making (see chart for details).    MDM Rules/Calculators/A&P                      Bladder scan was ordered and was 188 cc.  The Foley catheter was flushed by nursing staff and appeared to be flowing freely.  I did note when I was examining him there was a lot of sediment in his catheter tube.  Urinalysis  was sent to the lab.  Patient noted to have white blood cells in clumps in his urine, urine culture was sent.  He was started on oral Septra DS for 10 days, patient has an allergy to ceftriaxone, so Keflex was not used.  We discussed making sure he drinks  lots of fluids to keep his urine flowing freely.   Final Clinical Impression(s) / ED Diagnoses Final diagnoses:  Problem with Foley catheter, initial encounter ([place])  Urinary tract infection associated with indwelling urethral catheter, initial encounter ([place])    Rx / DC Orders ED Discharge Orders         Ordered    sulfamethoxazole-trimethoprim (BACTRIM DS) 800-160 MG tablet  2 times daily,   Status:  Discontinued     06/30/19 0212    sulfamethoxazole-trimethoprim (BACTRIM DS) 800-160 MG tablet  2 times daily     06/30/19 0227         Plan discharge  Rolland Porter, MD, Barbette Or, MD 06/30/19 [id number]

## 2019-06-30 NOTE — Discharge Instructions (Addendum)
Your urine looks like there is possibly an infection present, take the antibiotics until gone.  Try to drink fluids to keep your urine flowing.  [facility] if you continue to have catheter problems.  Return to the ED if you get a fever or your catheter totally stops draining.

## 2019-07-01 ENCOUNTER — Telehealth: Payer: Self-pay | Admitting: Urology

## 2019-07-01 LAB — URINE CULTURE
Culture: >=100000 — AB
Special Requests: NORMAL

## 2019-07-01 NOTE — Telephone Encounter (Signed)
Pt states his cath is leaking and getting his bed linens wet. He requests a nurse return his call.

## 2019-07-01 NOTE — Telephone Encounter (Signed)
Called pt back and per Dr. Diona Fanti pt can be taught self cath. Informed pt of recommendation. Pt scheduled for tomorrow for nurse visit in teach CIC.

## 2019-07-02 ENCOUNTER — Telehealth: Payer: Self-pay | Admitting: Emergency Medicine

## 2019-07-02 ENCOUNTER — Ambulatory Visit (INDEPENDENT_AMBULATORY_CARE_PROVIDER_SITE_OTHER): Payer: Medicare HMO | Admitting: Urology

## 2019-07-02 ENCOUNTER — Other Ambulatory Visit: Payer: Self-pay

## 2019-07-02 DIAGNOSIS — R531 Weakness: Secondary | ICD-10-CM | POA: Diagnosis not present

## 2019-07-02 DIAGNOSIS — N3281 Overactive bladder: Secondary | ICD-10-CM | POA: Diagnosis not present

## 2019-07-02 MED ORDER — MIRABEGRON ER 25 MG PO TB24: 25.0000 mg | ORAL_TABLET | Freq: Every day | ORAL | 1 refills | Status: DC

## 2019-07-02 NOTE — Progress Notes (Signed)
Mr Quirk came in today for self cath teaching. Pt reports he has increased fatigue and not feeling well today. Pt reports he is getting ready to start injections to increase RBC. After speaking with pt I did not feel pt would be able to do CIC at this time with his low energy level and not being able to stand. I discussed this with Dr. Alyson Ingles who agreed and pt agreed to received RBC injections and wait another several weeks to see if his energy level improved and mobility improved. Pt agreed. Pt does report constant leaking and increased pressure at head of penis with catheter. Dr. Alyson Ingles gave the order to send in prescription for myrbetriq to try for bladder spasms. Pt made aware. Pt also being treated for UTI from ER and has been on antibiotics for 2 days. Pt had bedside bag and yellow urine was noted to be in the bag. Pt will return on 1/26 for nurse visit to discuss CIC again if able to complete.

## 2019-07-02 NOTE — Progress Notes (Signed)
Danny [name] came in today for self cath teaching. Pt reports he has increased fatigue and not feeling well today. Pt reports he is getting ready to start injections to increase RBC. After speaking with pt I did not feel pt would be able to do CIC at this time with his low energy level and not being able to stand. I discussed this with Dr. Alyson Ingles who agreed and pt agreed to received RBC injections and wait another several weeks to see if his energy level improved and mobility improved. Pt agreed. Pt does report constant leaking and increased pressure at head of penis with catheter. Dr. Alyson Ingles gave the order to send in prescription for myrbetriq to try for bladder spasms. Pt made aware. Pt also being treated for UTI from ER and has been on antibiotics for 2 days. Pt had bedside bag and yellow urine was noted to be in the bag. Pt will return on 1/26 for nurse visit to discuss CIC again if able to complete.

## 2019-07-02 NOTE — Telephone Encounter (Signed)
Post ED Visit - Positive Culture Follow-up  Culture report reviewed by antimicrobial stewardship pharmacist: [place] Team []  Elenor Quinones, Pharm.D. []  Heide Guile, Pharm.D., BCPS AQ-ID []  Parks Neptune, Pharm.D., BCPS []  Alycia Rossetti, Pharm.D., BCPS []  [place], Pharm.D., BCPS, AAHIVP []  Legrand Como, Pharm.D., BCPS, AAHIVP []  Salome Arnt, PharmD, BCPS []  Johnnette Gourd, PharmD, BCPS []  Hughes Better, PharmD, BCPS [x]  Richardine Service, PharmD []  Laqueta Linden, PharmD, BCPS []  Albertina Parr, PharmD  [place] Team []  Leodis Sias, PharmD []  Lindell Spar, PharmD []  Royetta Asal, PharmD []  Graylin Shiver, Rph []  Rema Fendt) Glennon Mac, PharmD []  Arlyn Dunning, PharmD []  Netta Cedars, PharmD []  Dia Sitter, PharmD []  Leone Haven, PharmD []  Gretta Arab, PharmD []  Theodis Shove, PharmD []  Peggyann Juba, PharmD []  Reuel Boom, PharmD   Positive urine culture  no further patient follow-up is required at this time.  Sandi Raveling [place] 07/02/2019, 3:34 PM

## 2019-07-03 ENCOUNTER — Telehealth: Payer: Self-pay | Admitting: Urology

## 2019-07-03 DIAGNOSIS — R338 Other retention of urine: Secondary | ICD-10-CM | POA: Diagnosis not present

## 2019-07-03 NOTE — Telephone Encounter (Signed)
Called pt back. Pt is at [facility] at this time per pt his foley " has no urine coming out"

## 2019-07-03 NOTE — Telephone Encounter (Signed)
Pt called and is having issue with cath leaking out a lot of urine. He requests a nurse call for advice.

## 2019-07-04 DIAGNOSIS — E1122 Type 2 diabetes mellitus with diabetic chronic kidney disease: Secondary | ICD-10-CM | POA: Diagnosis not present

## 2019-07-04 DIAGNOSIS — I130 Hypertensive heart and chronic kidney disease with heart failure and stage 1 through stage 4 chronic kidney disease, or unspecified chronic kidney disease: Secondary | ICD-10-CM | POA: Diagnosis not present

## 2019-07-04 DIAGNOSIS — M1030 Gout due to renal impairment, unspecified site: Secondary | ICD-10-CM | POA: Diagnosis not present

## 2019-07-04 DIAGNOSIS — E11621 Type 2 diabetes mellitus with foot ulcer: Secondary | ICD-10-CM | POA: Diagnosis not present

## 2019-07-04 DIAGNOSIS — E785 Hyperlipidemia, unspecified: Secondary | ICD-10-CM | POA: Diagnosis not present

## 2019-07-04 DIAGNOSIS — N183 Chronic kidney disease, stage 3 unspecified: Secondary | ICD-10-CM | POA: Diagnosis not present

## 2019-07-04 DIAGNOSIS — L97319 Non-pressure chronic ulcer of right ankle with unspecified severity: Secondary | ICD-10-CM | POA: Diagnosis not present

## 2019-07-04 DIAGNOSIS — L97429 Non-pressure chronic ulcer of left heel and midfoot with unspecified severity: Secondary | ICD-10-CM | POA: Diagnosis not present

## 2019-07-04 DIAGNOSIS — I5032 Chronic diastolic (congestive) heart failure: Secondary | ICD-10-CM | POA: Diagnosis not present

## 2019-07-07 ENCOUNTER — Emergency Department (HOSPITAL_COMMUNITY): Payer: Medicare HMO

## 2019-07-07 ENCOUNTER — Other Ambulatory Visit: Payer: Self-pay

## 2019-07-07 ENCOUNTER — Inpatient Hospital Stay (HOSPITAL_COMMUNITY)
Admission: EM | Admit: 2019-07-07 | Discharge: 2019-07-17 | DRG: 698 | Disposition: A | Discharge status: Skilled Nursing Facility | Payer: Medicare HMO | Attending: Internal Medicine | Admitting: Internal Medicine

## 2019-07-07 ENCOUNTER — Encounter (HOSPITAL_COMMUNITY): Payer: Self-pay

## 2019-07-07 DIAGNOSIS — I110 Hypertensive heart disease with heart failure: Secondary | ICD-10-CM | POA: Diagnosis not present

## 2019-07-07 DIAGNOSIS — I10 Essential (primary) hypertension: Secondary | ICD-10-CM | POA: Diagnosis not present

## 2019-07-07 DIAGNOSIS — Z6841 Body Mass Index (BMI) 40.0 and over, adult: Secondary | ICD-10-CM

## 2019-07-07 DIAGNOSIS — T83518A Infection and inflammatory reaction due to other urinary catheter, initial encounter: Secondary | ICD-10-CM | POA: Diagnosis present

## 2019-07-07 DIAGNOSIS — R188 Other ascites: Secondary | ICD-10-CM | POA: Diagnosis present

## 2019-07-07 DIAGNOSIS — D631 Anemia in chronic kidney disease: Secondary | ICD-10-CM | POA: Diagnosis present

## 2019-07-07 DIAGNOSIS — E11621 Type 2 diabetes mellitus with foot ulcer: Secondary | ICD-10-CM | POA: Diagnosis present

## 2019-07-07 DIAGNOSIS — E785 Hyperlipidemia, unspecified: Secondary | ICD-10-CM | POA: Diagnosis present

## 2019-07-07 DIAGNOSIS — L89511 Pressure ulcer of right ankle, stage 1: Secondary | ICD-10-CM | POA: Diagnosis not present

## 2019-07-07 DIAGNOSIS — R0602 Shortness of breath: Secondary | ICD-10-CM | POA: Diagnosis not present

## 2019-07-07 DIAGNOSIS — I5032 Chronic diastolic (congestive) heart failure: Secondary | ICD-10-CM | POA: Diagnosis present

## 2019-07-07 DIAGNOSIS — Z888 Allergy status to other drugs, medicaments and biological substances status: Secondary | ICD-10-CM

## 2019-07-07 DIAGNOSIS — E1121 Type 2 diabetes mellitus with diabetic nephropathy: Secondary | ICD-10-CM | POA: Diagnosis not present

## 2019-07-07 DIAGNOSIS — E1165 Type 2 diabetes mellitus with hyperglycemia: Secondary | ICD-10-CM | POA: Diagnosis present

## 2019-07-07 DIAGNOSIS — Z881 Allergy status to other antibiotic agents status: Secondary | ICD-10-CM

## 2019-07-07 DIAGNOSIS — Z79899 Other long term (current) drug therapy: Secondary | ICD-10-CM | POA: Diagnosis not present

## 2019-07-07 DIAGNOSIS — N189 Chronic kidney disease, unspecified: Secondary | ICD-10-CM | POA: Diagnosis present

## 2019-07-07 DIAGNOSIS — Y846 Urinary catheterization as the cause of abnormal reaction of the patient, or of later complication, without mention of misadventure at the time of the procedure: Secondary | ICD-10-CM | POA: Diagnosis present

## 2019-07-07 DIAGNOSIS — B3749 Other urogenital candidiasis: Secondary | ICD-10-CM | POA: Diagnosis present

## 2019-07-07 DIAGNOSIS — E872 Acidosis: Secondary | ICD-10-CM | POA: Diagnosis present

## 2019-07-07 DIAGNOSIS — L97429 Non-pressure chronic ulcer of left heel and midfoot with unspecified severity: Secondary | ICD-10-CM | POA: Diagnosis present

## 2019-07-07 DIAGNOSIS — J9601 Acute respiratory failure with hypoxia: Secondary | ICD-10-CM | POA: Diagnosis present

## 2019-07-07 DIAGNOSIS — R0902 Hypoxemia: Secondary | ICD-10-CM | POA: Diagnosis not present

## 2019-07-07 DIAGNOSIS — M1A30X0 Chronic gout due to renal impairment, unspecified site, without tophus (tophi): Secondary | ICD-10-CM | POA: Diagnosis present

## 2019-07-07 DIAGNOSIS — M1A39X0 Chronic gout due to renal impairment, multiple sites, without tophus (tophi): Secondary | ICD-10-CM | POA: Diagnosis not present

## 2019-07-07 DIAGNOSIS — M1A9XX0 Chronic gout, unspecified, without tophus (tophi): Secondary | ICD-10-CM | POA: Diagnosis present

## 2019-07-07 DIAGNOSIS — R600 Localized edema: Secondary | ICD-10-CM | POA: Diagnosis not present

## 2019-07-07 DIAGNOSIS — Z794 Long term (current) use of insulin: Secondary | ICD-10-CM

## 2019-07-07 DIAGNOSIS — I509 Heart failure, unspecified: Secondary | ICD-10-CM | POA: Diagnosis not present

## 2019-07-07 DIAGNOSIS — L89899 Pressure ulcer of other site, unspecified stage: Secondary | ICD-10-CM

## 2019-07-07 DIAGNOSIS — E1122 Type 2 diabetes mellitus with diabetic chronic kidney disease: Secondary | ICD-10-CM

## 2019-07-07 DIAGNOSIS — M1A40X1 Other secondary chronic gout, unspecified site, with tophus (tophi): Secondary | ICD-10-CM | POA: Diagnosis present

## 2019-07-07 DIAGNOSIS — I5033 Acute on chronic diastolic (congestive) heart failure: Secondary | ICD-10-CM | POA: Diagnosis present

## 2019-07-07 DIAGNOSIS — R05 Cough: Secondary | ICD-10-CM | POA: Diagnosis not present

## 2019-07-07 DIAGNOSIS — L03116 Cellulitis of left lower limb: Secondary | ICD-10-CM | POA: Diagnosis not present

## 2019-07-07 DIAGNOSIS — N320 Bladder-neck obstruction: Secondary | ICD-10-CM | POA: Diagnosis present

## 2019-07-07 DIAGNOSIS — J90 Pleural effusion, not elsewhere classified: Secondary | ICD-10-CM | POA: Diagnosis not present

## 2019-07-07 DIAGNOSIS — R339 Retention of urine, unspecified: Secondary | ICD-10-CM | POA: Diagnosis not present

## 2019-07-07 DIAGNOSIS — R338 Other retention of urine: Secondary | ICD-10-CM | POA: Diagnosis not present

## 2019-07-07 DIAGNOSIS — G9341 Metabolic encephalopathy: Secondary | ICD-10-CM | POA: Diagnosis present

## 2019-07-07 DIAGNOSIS — I130 Hypertensive heart and chronic kidney disease with heart failure and stage 1 through stage 4 chronic kidney disease, or unspecified chronic kidney disease: Secondary | ICD-10-CM | POA: Diagnosis present

## 2019-07-07 DIAGNOSIS — E876 Hypokalemia: Secondary | ICD-10-CM | POA: Diagnosis not present

## 2019-07-07 DIAGNOSIS — N183 Chronic kidney disease, stage 3 unspecified: Secondary | ICD-10-CM

## 2019-07-07 DIAGNOSIS — N390 Urinary tract infection, site not specified: Secondary | ICD-10-CM | POA: Diagnosis not present

## 2019-07-07 DIAGNOSIS — G9349 Other encephalopathy: Secondary | ICD-10-CM | POA: Diagnosis not present

## 2019-07-07 DIAGNOSIS — Z7401 Bed confinement status: Secondary | ICD-10-CM | POA: Diagnosis not present

## 2019-07-07 DIAGNOSIS — E1169 Type 2 diabetes mellitus with other specified complication: Secondary | ICD-10-CM | POA: Diagnosis present

## 2019-07-07 DIAGNOSIS — N1832 Chronic kidney disease, stage 3b: Secondary | ICD-10-CM | POA: Diagnosis not present

## 2019-07-07 DIAGNOSIS — N179 Acute kidney failure, unspecified: Secondary | ICD-10-CM | POA: Diagnosis not present

## 2019-07-07 DIAGNOSIS — T83031A Leakage of indwelling urethral catheter, initial encounter: Secondary | ICD-10-CM | POA: Diagnosis not present

## 2019-07-07 DIAGNOSIS — R531 Weakness: Secondary | ICD-10-CM | POA: Diagnosis not present

## 2019-07-07 DIAGNOSIS — E11649 Type 2 diabetes mellitus with hypoglycemia without coma: Secondary | ICD-10-CM | POA: Diagnosis not present

## 2019-07-07 DIAGNOSIS — E119 Type 2 diabetes mellitus without complications: Secondary | ICD-10-CM

## 2019-07-07 DIAGNOSIS — Z87440 Personal history of urinary (tract) infections: Secondary | ICD-10-CM

## 2019-07-07 DIAGNOSIS — N184 Chronic kidney disease, stage 4 (severe): Secondary | ICD-10-CM | POA: Diagnosis present

## 2019-07-07 DIAGNOSIS — L8990 Pressure ulcer of unspecified site, unspecified stage: Secondary | ICD-10-CM

## 2019-07-07 DIAGNOSIS — Z20822 Contact with and (suspected) exposure to covid-19: Secondary | ICD-10-CM | POA: Diagnosis present

## 2019-07-07 DIAGNOSIS — I129 Hypertensive chronic kidney disease with stage 1 through stage 4 chronic kidney disease, or unspecified chronic kidney disease: Secondary | ICD-10-CM | POA: Diagnosis not present

## 2019-07-07 DIAGNOSIS — J9602 Acute respiratory failure with hypercapnia: Secondary | ICD-10-CM | POA: Diagnosis not present

## 2019-07-07 DIAGNOSIS — R0600 Dyspnea, unspecified: Secondary | ICD-10-CM

## 2019-07-07 DIAGNOSIS — T368X5A Adverse effect of other systemic antibiotics, initial encounter: Secondary | ICD-10-CM | POA: Diagnosis present

## 2019-07-07 DIAGNOSIS — B952 Enterococcus as the cause of diseases classified elsewhere: Secondary | ICD-10-CM | POA: Diagnosis present

## 2019-07-07 LAB — URINALYSIS, ROUTINE W REFLEX MICROSCOPIC
Bilirubin Urine: NEGATIVE
Glucose, UA: NEGATIVE mg/dL
Ketones, ur: NEGATIVE mg/dL
Nitrite: NEGATIVE
Protein, ur: 100 mg/dL — AB
RBC / HPF: >50 RBC/hpf — ABNORMAL HIGH (ref 0–5)
Specific Gravity, Urine: 1.01 (ref 1.005–1.030)
pH: 5 (ref 5.0–8.0)

## 2019-07-07 LAB — CBC WITH DIFFERENTIAL/PLATELET
Abs Immature Granulocytes: 0.05 10*3/uL (ref 0.00–0.07)
Basophils Absolute: 0 10*3/uL (ref 0.0–0.1)
Basophils Relative: 1 %
Eosinophils Absolute: 0.1 10*3/uL (ref 0.0–0.5)
Eosinophils Relative: 2 %
HCT: 24.7 % — ABNORMAL LOW (ref 39.0–52.0)
Hemoglobin: 7.8 g/dL — ABNORMAL LOW (ref 13.0–17.0)
Immature Granulocytes: 1 %
Lymphocytes Relative: 11 %
Lymphs Abs: 0.9 10*3/uL (ref 0.7–4.0)
MCH: 28.6 pg (ref 26.0–34.0)
MCHC: 31.6 g/dL (ref 30.0–36.0)
MCV: 90.5 fL (ref 80.0–100.0)
Monocytes Absolute: 0.5 10*3/uL (ref 0.1–1.0)
Monocytes Relative: 6 %
Neutro Abs: 6.4 10*3/uL (ref 1.7–7.7)
Neutrophils Relative %: 79 %
Platelets: 188 10*3/uL (ref 150–400)
RBC: 2.73 MIL/uL — ABNORMAL LOW (ref 4.22–5.81)
RDW: 18.6 % — ABNORMAL HIGH (ref 11.5–15.5)
WBC: 8 10*3/uL (ref 4.0–10.5)
nRBC: 0 % (ref 0.0–0.2)

## 2019-07-07 LAB — COMPREHENSIVE METABOLIC PANEL
ALT: 56 U/L — ABNORMAL HIGH (ref 0–44)
AST: 48 U/L — ABNORMAL HIGH (ref 15–41)
Albumin: 3.2 g/dL — ABNORMAL LOW (ref 3.5–5.0)
Alkaline Phosphatase: 106 U/L (ref 38–126)
Anion gap: 5 (ref 5–15)
BUN: 69 mg/dL — ABNORMAL HIGH (ref 8–23)
CO2: 24 mmol/L (ref 22–32)
Calcium: 8.8 mg/dL — ABNORMAL LOW (ref 8.9–10.3)
Chloride: 108 mmol/L (ref 98–111)
Creatinine, Ser: 4.57 mg/dL — ABNORMAL HIGH (ref 0.61–1.24)
GFR calc Af Amer: 15 mL/min — ABNORMAL LOW (ref >60)
GFR calc non Af Amer: 13 mL/min — ABNORMAL LOW (ref >60)
Glucose, Bld: 168 mg/dL — ABNORMAL HIGH (ref 70–99)
Potassium: 4.8 mmol/L (ref 3.5–5.1)
Sodium: 137 mmol/L (ref 135–145)
Total Bilirubin: 1.1 mg/dL (ref 0.3–1.2)
Total Protein: 7.2 g/dL (ref 6.5–8.1)

## 2019-07-07 LAB — POC SARS CORONAVIRUS 2 AG -  ED: SARS Coronavirus 2 Ag: NEGATIVE

## 2019-07-07 LAB — TROPONIN I (HIGH SENSITIVITY): Troponin I (High Sensitivity): 6 ng/L (ref <18)

## 2019-07-07 LAB — BRAIN NATRIURETIC PEPTIDE: B Natriuretic Peptide: 207 pg/mL — ABNORMAL HIGH (ref 0.0–100.0)

## 2019-07-07 NOTE — ED Triage Notes (Signed)
Pt brought to ED via [facility] for SOB started last night, non productive cough. Pt denies fever. Pt with hx CHF. Pt sats on EMS 88% placed on 3L of O2 and increased to 96%.

## 2019-07-07 NOTE — ED Notes (Signed)
Radiology at bedside

## 2019-07-07 NOTE — ED Provider Notes (Addendum)
[facility] Provider Note   CSN: [id number] Arrival date & time: 07/07/19  1615     History Chief Complaint  Patient presents with  . Shortness of Breath    Danny [name] is a 64 y.o. male.  Pt complains of increasing shortness of breath for the past 2 days.  Pt has a history of congestive heart failure.  Pt reports he has sores on his foot and his ankle.  Pt's sister reports pt is not doing well.  She is concerned that he has heart failure again.   The history is provided by the patient. No language interpreter was used.  Shortness of Breath Severity:  Moderate Onset quality:  Gradual Duration:  2 days Timing:  Constant Progression:  Worsening Chronicity:  New Context: activity and URI   Relieved by:  Nothing Worsened by:  Nothing Ineffective treatments:  None tried Associated symptoms: no cough, no fever and no vomiting    Pt reports he was exposed to a person who's child has covid.  Pt reports he has increased shortness of breath.  Pt had 02 sats of 88 prior to being placed on oxygen.  Pt's 02 sats 98% on 3 liters of 02.     Past Medical History:  Diagnosis Date  . Arthritis   . Diabetes mellitus without complication ([place])   . Foot ulcer ([place])   . Hypertension   . Urinary retention     Patient Active Problem List   Diagnosis Date Noted  . Anasarca 03/23/2019  . Bladder outlet obstruction 03/23/2019  . Yeast infection of the skin 03/16/2019  . Diabetic ulcer of left foot ([place]) 03/15/2019  . Diabetic ulcer of right ankle ([place]) 03/15/2019  . Hypertension associated with stage 3 chronic kidney disease due to type 2 diabetes mellitus ([place]) 03/09/2019  . Controlled type 2 diabetes mellitus with stage 3 chronic kidney disease, with long-term current use of insulin ([place]) 03/09/2019  . Dyslipidemia associated with type 2 diabetes mellitus ([place]) 03/09/2019  . Chronic gout due to renal impairment without tophus 03/09/2019  . Emphysematous cystitis  03/05/2019  . Bilateral hydronephrosis   . Bilateral cellulitis of lower leg 03/03/2019  . Acute urinary retention 03/03/2019  . Klebsiella Cystitis 03/03/2019  . UTI due to Klebsiella species 03/03/2019  . Plantar ulcer of left foot ([place]) 03/03/2019  . Acute on chronic diastolic (congestive) heart failure ([place]) 02/25/2019  . Acute respiratory failure with hypoxia ([place]) 02/25/2019  . Acute renal failure superimposed on stage 3 chronic kidney disease ([place]) 02/24/2019  . Acute CHF (congestive heart failure) ([place]) 02/24/2019  . Dyspnea 02/23/2019  . Essential hypertension 02/23/2019  . Diabetes mellitus ([place]) 02/23/2019  . CKD (chronic kidney disease) 02/23/2019  . Psoriasis 02/23/2019    Past Surgical History:  Procedure Laterality Date  . ANKLE SURGERY    . CHOLECYSTECTOMY    . FOOT SURGERY         No family history on file.  Social History   Tobacco Use  . Smoking status: Never Smoker  . Smokeless tobacco: Never Used  Substance Use Topics  . Alcohol use: No  . Drug use: No    Home Medications Prior to Admission medications   Medication Sig Start Date End Date Taking? Authorizing Provider  ACCU-CHEK SMARTVIEW test strip 1 each by Other route as needed.  05/15/19   [provider]  acetaminophen (TYLENOL) 325 MG tablet Take 650 mg by mouth as needed for mild pain or moderate pain.  [provider]  allopurinol (ZYLOPRIM) 300 MG tablet Take 1 tablet (300 mg total) by mouth daily. 03/20/19   Gerlene Fee, NP  Amino Acids-Protein Hydrolys (FEEDING SUPPLEMENT, PRO-STAT SUGAR FREE 64,) LIQD Take 30 mLs by mouth 2 (two) times daily between meals.    [provider]  amLODipine (NORVASC) 10 MG tablet Take 1 tablet (10 mg total) by mouth at bedtime. 03/20/19   Gerlene Fee, NP  ascorbic acid (VITAMIN C) 500 MG tablet Take 500 mg by mouth daily.     [provider]  Brodalumab 210 MG/1.5ML SOSY Inject 210 mg into the skin every 14  (fourteen) days. 12/25/17   [provider]  cephALEXin (KEFLEX) 500 MG capsule  06/11/19   [provider]  collagenase (SANTYL) ointment Ointment; 250 unit/gram; topical  Special Instructions: Apply to wounds per treatment orders.    [provider]  Colloidal Oatmeal (ECZEMA MOISTURIZING EX) Apply 1 application topically daily as needed (applied to legs).    [provider]  doxycycline (ADOXA) 100 MG tablet  05/30/19   [provider]  [place] X 5/16" 1 ML [place]  04/28/19   [provider]  fluticasone (FLONASE) 50 MCG/ACT nasal spray Place 2 sprays into both nostrils daily.  02/11/19   [provider]  folic acid (FOLVITE) 1 MG tablet Take 1 tablet (1 mg total) by mouth daily. 03/20/19   Gerlene Fee, NP  insulin aspart protamine - aspart (NOVOLOG MIX 70/30 FLEXPEN) (70-30) 100 UNIT/ML FlexPen Inject 0.3 mLs (30 Units total) into the skin 2 (two) times daily with a meal. 03/20/19   Nyoka Cowden, Phylis Bougie, NP  linagliptin (TRADJENTA) 5 MG TABS tablet Take 1 tablet (5 mg total) by mouth daily. 03/20/19   Gerlene Fee, NP  Melatonin 1 MG CAPS Take 1 mg by mouth as needed. For insomnia    [provider]  metoprolol succinate (TOPROL-XL) 100 MG 24 hr tablet Take 1 tablet (100 mg total) by mouth every evening. 03/20/19   Gerlene Fee, NP  mirabegron ER (MYRBETRIQ) 25 MG TB24 tablet Take 1 tablet (25 mg total) by mouth daily. 07/02/19   [name], [name], MD  NON FORMULARY Diet: Regular, NAS, Consistent Carbohydrate    [provider]  NOVOLIN 70/30 RELION (70-30) 100 UNIT/ML injection Inject 10 Units into the skin 2 (two) times daily with a meal.  05/01/19   [provider]  Omega-3 Fatty Acids (FISH OIL) 1000 MG CAPS Take by mouth.    [provider]  pravastatin (PRAVACHOL) 80 MG tablet Take 1 tablet (80 mg total) by mouth every evening. 03/20/19   Gerlene Fee, NP  silver nitrate  applicators 38-25 % applicator Topical  Special Instructions: Apply to rolled borders of wound to right lateral malleolus daily as needed for epithelized borders. Once A Day - PRN    [provider]  silver nitrate applicators 05-39 % applicator ; topical  Special Instructions: Apply to rolled borders of wound to left plantar foot daily as needed for epithelized borders. Once A Day - PRN PRN 1    [provider]  sulfamethoxazole-trimethoprim (BACTRIM DS) 800-160 MG tablet Take 1 tablet by mouth 2 (two) times daily. 06/30/19   Rolland Porter, MD  tamsulosin (FLOMAX) 0.4 MG CAPS capsule Take 1 capsule (0.4 mg total) by mouth daily after supper. 03/20/19   Gerlene Fee, NP  torsemide (DEMADEX) 20 MG tablet Take 1 tablet (  20 mg total) by mouth daily. 03/20/19   Gerlene Fee, NP    Allergies    Dust mite extract, Prednisone, and Rocephin [ceftriaxone]  Review of Systems   Review of Systems  Constitutional: Negative for fever.  Respiratory: Positive for shortness of breath. Negative for cough.   Gastrointestinal: Negative for vomiting.    Physical Exam Updated Vital Signs BP (!) 144/72   Pulse (!) 53   Temp 98.3 F (36.8 C) (Oral)   Resp 12   Ht 6' (1.829 m)   Wt (!) 145.2 kg   SpO2 100%   BMI 43.40 kg/m   Physical Exam Vitals and nursing note reviewed.  Constitutional:      Appearance: He is well-developed.  HENT:     Head: Normocephalic.     Mouth/Throat:     Mouth: Mucous membranes are moist.  Eyes:     Pupils: Pupils are equal, round, and reactive to light.  Cardiovascular:     Rate and Rhythm: Normal rate and regular rhythm.  Pulmonary:     Effort: Pulmonary effort is normal.     Breath sounds: No decreased breath sounds.  Chest:     Chest wall: No mass.  Abdominal:     General: There is no distension.     Palpations: Abdomen is soft.  Musculoskeletal:        General: Normal range of motion.     Cervical back: Normal range of motion.   Skin:    General: Skin is warm.  Neurological:     General: No focal deficit present.     Mental Status: He is alert and oriented to person, place, and time.  Psychiatric:        Mood and Affect: Mood normal.     ED Results / Procedures / Treatments   Labs (all labs ordered are listed, but only abnormal results are displayed) Labs Reviewed  CBC WITH DIFFERENTIAL/PLATELET - Abnormal; Notable for the following components:      Result Value   RBC 2.73 (*)    Hemoglobin 7.8 (*)    HCT 24.7 (*)    RDW 18.6 (*)    All other components within normal limits  COMPREHENSIVE METABOLIC PANEL - Abnormal; Notable for the following components:   Glucose, Bld 168 (*)    BUN 69 (*)    Creatinine, Ser 4.57 (*)    Calcium 8.8 (*)    Albumin 3.2 (*)    AST 48 (*)    ALT 56 (*)    GFR calc non Af Amer 13 (*)    GFR calc Af Amer 15 (*)    All other components within normal limits  URINALYSIS, ROUTINE W REFLEX MICROSCOPIC - Abnormal; Notable for the following components:   APPearance CLOUDY (*)    Hgb urine dipstick SMALL (*)    Protein, ur 100 (*)    Leukocytes,Ua MODERATE (*)    RBC / HPF >50 (*)    Bacteria, UA MANY (*)    All other components within normal limits  BRAIN NATRIURETIC PEPTIDE - Abnormal; Notable for the following components:   B Natriuretic Peptide 207.0 (*)    All other components within normal limits  POC SARS CORONAVIRUS 2 AG -  ED    EKG None  Radiology DG Chest Portable 1 View  Result Date: 07/07/2019 CLINICAL DATA:  Shortness of breath since last night, nonproductive cough, history CHF, hypertension, diabetes mellitus EXAM: PORTABLE CHEST 1 VIEW COMPARISON:  Portable exam  1702 hours compared to 06/26/2019 FINDINGS: Enlargement of cardiac silhouette. Mediastinal contours and pulmonary vascularity normal. Minimal RIGHT pleural effusion and bibasilar atelectasis. Remaining lungs clear. No pneumothorax or acute osseous findings. IMPRESSION: Tiny RIGHT pleural  effusion and mild bibasilar atelectasis. Electronically Signed   By: Lavonia Dana M.D.   On: 07/07/2019 17:20    Procedures Procedures (including critical care time)  Medications Ordered in ED Medications - No data to display  ED Course  I have reviewed the triage vital signs and the nursing notes.  Pertinent labs & imaging results that were available during my care of the patient were reviewed by me and considered in my medical decision making (see chart for details).    MDM Rules/Calculators/A&P                     MDM  Covid rapid test negative, EKG no acute abnormality.  Chest xray shows minimal right effusion. BNP is 207.  Pt has an increase in Bun and creatinine from baseline Pt has chronic urinary infection.  Hemoglobin is chronic low and is 7.8 today.  I  I spoke with Hospitalist Dr. Darrick Meigs who will admit  Final Clinical Impression(s) / ED Diagnoses Final diagnoses:  Hypoxia  Congestive heart failure, unspecified HF chronicity, unspecified heart failure type ([place])  Edema of both lower extremities  Pressure injury of skin of left foot, unspecified injury stage  Stage I pressure ulcer of right ankle    Rx / DC Orders ED Discharge Orders    None       Sidney Ace 07/08/19 0007    Milton Ferguson, MD 07/10/19 [place], PA-C 07/22/19 1610    Milton Ferguson, MD 07/22/19 [phone]

## 2019-07-08 ENCOUNTER — Inpatient Hospital Stay (HOSPITAL_COMMUNITY): Payer: Medicare HMO

## 2019-07-08 ENCOUNTER — Encounter (HOSPITAL_COMMUNITY): Payer: Medicare HMO

## 2019-07-08 ENCOUNTER — Encounter (HOSPITAL_COMMUNITY)
Admission: RE | Admit: 2019-07-08 | Discharge: 2019-07-08 | Disposition: A | Discharge status: Home / Self Care | Payer: Medicare HMO | Source: Ambulatory Visit | Attending: Nephrology | Admitting: Nephrology

## 2019-07-08 DIAGNOSIS — E1122 Type 2 diabetes mellitus with diabetic chronic kidney disease: Secondary | ICD-10-CM

## 2019-07-08 DIAGNOSIS — T83518A Infection and inflammatory reaction due to other urinary catheter, initial encounter: Secondary | ICD-10-CM | POA: Diagnosis present

## 2019-07-08 DIAGNOSIS — B3749 Other urogenital candidiasis: Secondary | ICD-10-CM | POA: Diagnosis present

## 2019-07-08 DIAGNOSIS — Z6841 Body Mass Index (BMI) 40.0 and over, adult: Secondary | ICD-10-CM | POA: Diagnosis not present

## 2019-07-08 DIAGNOSIS — N184 Chronic kidney disease, stage 4 (severe): Secondary | ICD-10-CM | POA: Diagnosis present

## 2019-07-08 DIAGNOSIS — G9341 Metabolic encephalopathy: Secondary | ICD-10-CM | POA: Diagnosis present

## 2019-07-08 DIAGNOSIS — E1169 Type 2 diabetes mellitus with other specified complication: Secondary | ICD-10-CM

## 2019-07-08 DIAGNOSIS — I509 Heart failure, unspecified: Secondary | ICD-10-CM

## 2019-07-08 DIAGNOSIS — I5033 Acute on chronic diastolic (congestive) heart failure: Secondary | ICD-10-CM

## 2019-07-08 DIAGNOSIS — R600 Localized edema: Secondary | ICD-10-CM | POA: Diagnosis not present

## 2019-07-08 DIAGNOSIS — Z794 Long term (current) use of insulin: Secondary | ICD-10-CM

## 2019-07-08 DIAGNOSIS — E1165 Type 2 diabetes mellitus with hyperglycemia: Secondary | ICD-10-CM | POA: Diagnosis present

## 2019-07-08 DIAGNOSIS — L8990 Pressure ulcer of unspecified site, unspecified stage: Secondary | ICD-10-CM

## 2019-07-08 DIAGNOSIS — R0902 Hypoxemia: Secondary | ICD-10-CM | POA: Diagnosis not present

## 2019-07-08 DIAGNOSIS — Z888 Allergy status to other drugs, medicaments and biological substances status: Secondary | ICD-10-CM | POA: Diagnosis not present

## 2019-07-08 DIAGNOSIS — Y846 Urinary catheterization as the cause of abnormal reaction of the patient, or of later complication, without mention of misadventure at the time of the procedure: Secondary | ICD-10-CM | POA: Diagnosis present

## 2019-07-08 DIAGNOSIS — L89511 Pressure ulcer of right ankle, stage 1: Secondary | ICD-10-CM

## 2019-07-08 DIAGNOSIS — E785 Hyperlipidemia, unspecified: Secondary | ICD-10-CM

## 2019-07-08 DIAGNOSIS — J9602 Acute respiratory failure with hypercapnia: Secondary | ICD-10-CM | POA: Diagnosis not present

## 2019-07-08 DIAGNOSIS — N320 Bladder-neck obstruction: Secondary | ICD-10-CM

## 2019-07-08 DIAGNOSIS — E872 Acidosis: Secondary | ICD-10-CM | POA: Diagnosis present

## 2019-07-08 DIAGNOSIS — I10 Essential (primary) hypertension: Secondary | ICD-10-CM

## 2019-07-08 DIAGNOSIS — M1A39X0 Chronic gout due to renal impairment, multiple sites, without tophus (tophi): Secondary | ICD-10-CM

## 2019-07-08 DIAGNOSIS — L97429 Non-pressure chronic ulcer of left heel and midfoot with unspecified severity: Secondary | ICD-10-CM | POA: Diagnosis present

## 2019-07-08 DIAGNOSIS — L89899 Pressure ulcer of other site, unspecified stage: Secondary | ICD-10-CM

## 2019-07-08 DIAGNOSIS — M1A9XX0 Chronic gout, unspecified, without tophus (tophi): Secondary | ICD-10-CM | POA: Diagnosis present

## 2019-07-08 DIAGNOSIS — J9601 Acute respiratory failure with hypoxia: Secondary | ICD-10-CM | POA: Diagnosis present

## 2019-07-08 DIAGNOSIS — R339 Retention of urine, unspecified: Secondary | ICD-10-CM

## 2019-07-08 DIAGNOSIS — N183 Chronic kidney disease, stage 3 unspecified: Secondary | ICD-10-CM

## 2019-07-08 DIAGNOSIS — Z79899 Other long term (current) drug therapy: Secondary | ICD-10-CM | POA: Diagnosis not present

## 2019-07-08 DIAGNOSIS — N179 Acute kidney failure, unspecified: Secondary | ICD-10-CM | POA: Diagnosis present

## 2019-07-08 DIAGNOSIS — Z881 Allergy status to other antibiotic agents status: Secondary | ICD-10-CM | POA: Diagnosis not present

## 2019-07-08 DIAGNOSIS — R188 Other ascites: Secondary | ICD-10-CM | POA: Diagnosis present

## 2019-07-08 DIAGNOSIS — I130 Hypertensive heart and chronic kidney disease with heart failure and stage 1 through stage 4 chronic kidney disease, or unspecified chronic kidney disease: Secondary | ICD-10-CM | POA: Diagnosis present

## 2019-07-08 DIAGNOSIS — Z20822 Contact with and (suspected) exposure to covid-19: Secondary | ICD-10-CM | POA: Diagnosis present

## 2019-07-08 LAB — GLUCOSE, CAPILLARY
Glucose-Capillary: 207 mg/dL — ABNORMAL HIGH (ref 70–99)
Glucose-Capillary: 213 mg/dL — ABNORMAL HIGH (ref 70–99)

## 2019-07-08 LAB — CBG MONITORING, ED
Glucose-Capillary: 62 mg/dL — ABNORMAL LOW (ref 70–99)
Glucose-Capillary: 110 mg/dL — ABNORMAL HIGH (ref 70–99)
Glucose-Capillary: 185 mg/dL — ABNORMAL HIGH (ref 70–99)

## 2019-07-08 LAB — CBC
HCT: 24 % — ABNORMAL LOW (ref 39.0–52.0)
Hemoglobin: 7.4 g/dL — ABNORMAL LOW (ref 13.0–17.0)
MCH: 28.2 pg (ref 26.0–34.0)
MCHC: 30.8 g/dL (ref 30.0–36.0)
MCV: 91.6 fL (ref 80.0–100.0)
Platelets: 170 10*3/uL (ref 150–400)
RBC: 2.62 MIL/uL — ABNORMAL LOW (ref 4.22–5.81)
RDW: 18.6 % — ABNORMAL HIGH (ref 11.5–15.5)
WBC: 7.9 10*3/uL (ref 4.0–10.5)
nRBC: 0 % (ref 0.0–0.2)

## 2019-07-08 LAB — IRON AND TIBC
Iron: 53 ug/dL (ref 45–182)
Saturation Ratios: 16 % — ABNORMAL LOW (ref 17.9–39.5)
TIBC: 327 ug/dL (ref 250–450)
UIBC: 274 ug/dL

## 2019-07-08 LAB — RESPIRATORY PANEL BY RT PCR (FLU A&B, COVID)
Influenza A by PCR: NEGATIVE
Influenza B by PCR: NEGATIVE
SARS Coronavirus 2 by RT PCR: NEGATIVE

## 2019-07-08 LAB — COMPREHENSIVE METABOLIC PANEL
ALT: 53 U/L — ABNORMAL HIGH (ref 0–44)
AST: 37 U/L (ref 15–41)
Albumin: 3 g/dL — ABNORMAL LOW (ref 3.5–5.0)
Alkaline Phosphatase: 99 U/L (ref 38–126)
Anion gap: 6 (ref 5–15)
BUN: 68 mg/dL — ABNORMAL HIGH (ref 8–23)
CO2: 23 mmol/L (ref 22–32)
Calcium: 8.7 mg/dL — ABNORMAL LOW (ref 8.9–10.3)
Chloride: 108 mmol/L (ref 98–111)
Creatinine, Ser: 4.62 mg/dL — ABNORMAL HIGH (ref 0.61–1.24)
GFR calc Af Amer: 14 mL/min — ABNORMAL LOW (ref >60)
GFR calc non Af Amer: 12 mL/min — ABNORMAL LOW (ref >60)
Glucose, Bld: 77 mg/dL (ref 70–99)
Potassium: 5 mmol/L (ref 3.5–5.1)
Sodium: 137 mmol/L (ref 135–145)
Total Bilirubin: 1 mg/dL (ref 0.3–1.2)
Total Protein: 6.8 g/dL (ref 6.5–8.1)

## 2019-07-08 LAB — SARS CORONAVIRUS 2 (TAT 6-24 HRS): SARS Coronavirus 2: NEGATIVE

## 2019-07-08 LAB — HEMOGLOBIN A1C
Hgb A1c MFr Bld: 7 % — ABNORMAL HIGH (ref 4.8–5.6)
Mean Plasma Glucose: 154.2 mg/dL

## 2019-07-08 LAB — FERRITIN: Ferritin: 310 ng/mL (ref 24–336)

## 2019-07-08 LAB — TROPONIN I (HIGH SENSITIVITY): Troponin I (High Sensitivity): 6 ng/L

## 2019-07-08 MED ORDER — SODIUM CHLORIDE 0.9% FLUSH: 3.0000 mL | INTRAVENOUS | Status: DC | PRN

## 2019-07-08 MED ORDER — MIRABEGRON ER 25 MG PO TB24
25.0000 mg | ORAL_TABLET | Freq: Every day | ORAL | Status: DC
  Administered 2019-07-09 – 2019-07-17 (×9): 25 mg via ORAL
  Filled 2019-07-08 (×10): qty 1

## 2019-07-08 MED ORDER — DARBEPOETIN ALFA 40 MCG/0.4ML IJ SOSY
25.0000 ug | PREFILLED_SYRINGE | Freq: Once | INTRAMUSCULAR | Status: AC
  Administered 2019-07-08: 25 ug via SUBCUTANEOUS
  Filled 2019-07-08: qty 0.4

## 2019-07-08 MED ORDER — AMLODIPINE BESYLATE 5 MG PO TABS
10.0000 mg | ORAL_TABLET | Freq: Every day | ORAL | Status: DC
  Administered 2019-07-08 – 2019-07-15 (×8): 10 mg via ORAL
  Filled 2019-07-08 (×8): qty 2

## 2019-07-08 MED ORDER — DARBEPOETIN ALFA 25 MCG/0.42ML IJ SOSY
25.0000 ug | PREFILLED_SYRINGE | Freq: Once | INTRAMUSCULAR | Status: DC
  Filled 2019-07-08: qty 0.42

## 2019-07-08 MED ORDER — HEPARIN SODIUM (PORCINE) 5000 UNIT/ML IJ SOLN
5000.0000 [IU] | Freq: Three times a day (TID) | INTRAMUSCULAR | Status: DC
  Administered 2019-07-08 – 2019-07-17 (×29): 5000 [IU] via SUBCUTANEOUS
  Filled 2019-07-08 (×29): qty 1

## 2019-07-08 MED ORDER — INSULIN ASPART 100 UNIT/ML ~~LOC~~ SOLN
0.0000 [IU] | Freq: Three times a day (TID) | SUBCUTANEOUS | Status: DC
  Administered 2019-07-08: 17:00:00 3 [IU] via SUBCUTANEOUS
  Administered 2019-07-08 – 2019-07-10 (×5): 2 [IU] via SUBCUTANEOUS
  Administered 2019-07-10 (×2): 1 [IU] via SUBCUTANEOUS
  Administered 2019-07-11: 2 [IU] via SUBCUTANEOUS
  Administered 2019-07-11 – 2019-07-12 (×3): 1 [IU] via SUBCUTANEOUS
  Administered 2019-07-12 – 2019-07-13 (×2): 2 [IU] via SUBCUTANEOUS
  Administered 2019-07-13: 1 [IU] via SUBCUTANEOUS
  Administered 2019-07-13: 2 [IU] via SUBCUTANEOUS
  Administered 2019-07-14: 5 [IU] via SUBCUTANEOUS
  Administered 2019-07-14: 7 [IU] via SUBCUTANEOUS
  Administered 2019-07-14 (×2): 2 [IU] via SUBCUTANEOUS
  Administered 2019-07-15: 3 [IU] via SUBCUTANEOUS
  Administered 2019-07-15: 2 [IU] via SUBCUTANEOUS
  Administered 2019-07-15: 3 [IU] via SUBCUTANEOUS
  Administered 2019-07-16: 2 [IU] via SUBCUTANEOUS
  Administered 2019-07-16 (×2): 3 [IU] via SUBCUTANEOUS
  Administered 2019-07-17: 5 [IU] via SUBCUTANEOUS
  Administered 2019-07-17: 2 [IU] via SUBCUTANEOUS
  Filled 2019-07-08: qty 1

## 2019-07-08 MED ORDER — ONDANSETRON HCL 4 MG/2ML IJ SOLN
4.0000 mg | Freq: Four times a day (QID) | INTRAMUSCULAR | Status: DC | PRN
  Administered 2019-07-15: 02:00:00 4 mg via INTRAVENOUS
  Filled 2019-07-08: qty 2

## 2019-07-08 MED ORDER — TAMSULOSIN HCL 0.4 MG PO CAPS
0.4000 mg | ORAL_CAPSULE | Freq: Every day | ORAL | Status: DC
  Administered 2019-07-08 – 2019-07-16 (×9): 0.4 mg via ORAL
  Filled 2019-07-08 (×9): qty 1

## 2019-07-08 MED ORDER — INSULIN ASPART PROT & ASPART (70-30 MIX) 100 UNIT/ML ~~LOC~~ SUSP
4.0000 [IU] | Freq: Two times a day (BID) | SUBCUTANEOUS | Status: DC
  Administered 2019-07-08 – 2019-07-15 (×11): 4 [IU] via SUBCUTANEOUS
  Filled 2019-07-08 (×2): qty 10

## 2019-07-08 MED ORDER — ONDANSETRON HCL 4 MG PO TABS
4.0000 mg | ORAL_TABLET | Freq: Four times a day (QID) | ORAL | Status: DC | PRN
  Administered 2019-07-13: 4 mg via ORAL
  Filled 2019-07-08: qty 1

## 2019-07-08 MED ORDER — FOLIC ACID 1 MG PO TABS
1.0000 mg | ORAL_TABLET | Freq: Every day | ORAL | Status: DC
  Administered 2019-07-08 – 2019-07-17 (×10): 1 mg via ORAL
  Filled 2019-07-08 (×10): qty 1

## 2019-07-08 MED ORDER — INSULIN ASPART PROT & ASPART (70-30 MIX) 100 UNIT/ML ~~LOC~~ SUSP
10.0000 [IU] | Freq: Two times a day (BID) | SUBCUTANEOUS | Status: DC
  Filled 2019-07-08: qty 10

## 2019-07-08 MED ORDER — ALBUTEROL SULFATE (2.5 MG/3ML) 0.083% IN NEBU
INHALATION_SOLUTION | RESPIRATORY_TRACT | Status: AC
  Administered 2019-07-08: 2.5 mg via RESPIRATORY_TRACT
  Filled 2019-07-08: qty 3

## 2019-07-08 MED ORDER — ACETAMINOPHEN 650 MG RE SUPP: 650.0000 mg | Freq: Four times a day (QID) | RECTAL | Status: DC | PRN

## 2019-07-08 MED ORDER — ALBUTEROL SULFATE (2.5 MG/3ML) 0.083% IN NEBU
2.5000 mg | INHALATION_SOLUTION | RESPIRATORY_TRACT | Status: DC | PRN
  Administered 2019-07-09: 2.5 mg via RESPIRATORY_TRACT
  Filled 2019-07-08: qty 3

## 2019-07-08 MED ORDER — GUAIFENESIN ER 600 MG PO TB12
600.0000 mg | ORAL_TABLET | Freq: Two times a day (BID) | ORAL | Status: DC
  Administered 2019-07-08 – 2019-07-17 (×19): 600 mg via ORAL
  Filled 2019-07-08 (×19): qty 1

## 2019-07-08 MED ORDER — ALLOPURINOL 300 MG PO TABS
300.0000 mg | ORAL_TABLET | Freq: Every day | ORAL | Status: DC
  Administered 2019-07-08 – 2019-07-17 (×10): 300 mg via ORAL
  Filled 2019-07-08 (×10): qty 1

## 2019-07-08 MED ORDER — SODIUM CHLORIDE 0.9 % IV SOLN
250.0000 mL | INTRAVENOUS | Status: DC | PRN
  Administered 2019-07-12: 250 mL via INTRAVENOUS

## 2019-07-08 MED ORDER — SODIUM CHLORIDE 0.9% FLUSH
3.0000 mL | Freq: Two times a day (BID) | INTRAVENOUS | Status: DC
  Administered 2019-07-09 – 2019-07-17 (×17): 3 mL via INTRAVENOUS

## 2019-07-08 MED ORDER — FUROSEMIDE 10 MG/ML IJ SOLN
80.0000 mg | Freq: Once | INTRAMUSCULAR | Status: AC
  Administered 2019-07-08: 80 mg via INTRAVENOUS
  Filled 2019-07-08: qty 8

## 2019-07-08 MED ORDER — ACETAMINOPHEN 325 MG PO TABS
650.0000 mg | ORAL_TABLET | Freq: Four times a day (QID) | ORAL | Status: DC | PRN
  Administered 2019-07-13: 650 mg via ORAL
  Filled 2019-07-08: qty 2

## 2019-07-08 MED ORDER — METOPROLOL SUCCINATE ER 50 MG PO TB24
100.0000 mg | ORAL_TABLET | Freq: Every evening | ORAL | Status: DC
  Administered 2019-07-08 – 2019-07-16 (×9): 100 mg via ORAL
  Filled 2019-07-08 (×9): qty 2

## 2019-07-08 MED ORDER — PRAVASTATIN SODIUM 40 MG PO TABS
80.0000 mg | ORAL_TABLET | Freq: Every evening | ORAL | Status: DC
  Administered 2019-07-08 – 2019-07-16 (×9): 80 mg via ORAL
  Filled 2019-07-08 (×10): qty 2

## 2019-07-08 NOTE — ED Notes (Signed)
Pt's CBG low. Have given graham crackers and orange juice. Pt has requested to sit on the side of the bed before ambulating to restroom.

## 2019-07-08 NOTE — Consult Note (Signed)
[facility] Renal Consultation Note  Requesting MD: Irwin Brakeman, MD Indication for Consultation: CKD  Chief complaint: Shortness of breath  HPI: Danny [name] is a 64 y.o. male with a history of diabetes mellitus, chronic urinary retention with Foley catheter in place, CKD stage IV, and chronic diastolic CHF who presented to the hospital with shortness of breath.  He feels swollen especially in his abdomen.  He also reported coughing up phlegm.  Also feels cold and is wearing a blanket in the ER.  He has been on 2 L of oxygen this morning. He has had a respiratory viral panel and a separate Covid test jere which are both negative for Covid.  Influenza is negative.  Note that with regard to his Foley catheter, he has failed multiple voiding trials in the past and was recently seen by urology in the office and his Foley was exchanged due to leaking at that time.  Note that he was recently given Bactrim - on 1/4 for a UTI.  States that he was scheduled to start injections to raise his blood counts today at the cancer center however missed the appointment due to coming in.  He has not received any of these injections before as an outpatient.  He states that his iron is low as well.  His brother had renal failure but was not on dialysis-the patient's brother has passed away unfortunately.  Renal ultrasound earlier today with no hydro and bladder collapsed around foley..  Small volume pelvic ascites.  He follows with Dr. Theador Hawthorne here in the area.  He does not have a fistula in place.  He would want dialysis if it were needed  Creatinine, Ser  Date/Time Value Ref Range Status  07/08/2019 04:42 AM 4.62 (H) 0.61 - 1.24 mg/dL Final  07/07/2019 05:00 PM 4.57 (H) 0.61 - 1.24 mg/dL Final  06/26/2019 01:11 PM 3.10 (H) 0.61 - 1.24 mg/dL Final  03/20/2019 07:00 AM 3.19 (H) 0.61 - 1.24 mg/dL Final  03/13/2019 07:00 AM 3.07 (H) 0.61 - 1.24 mg/dL Final  03/13/2019 07:00 AM 3.02 (H) 0.61 - 1.24 mg/dL  Final  03/05/2019 05:22 AM 3.14 (H) 0.61 - 1.24 mg/dL Final  03/04/2019 04:54 AM 3.35 (H) 0.61 - 1.24 mg/dL Final  03/03/2019 04:45 AM 3.36 (H) 0.61 - 1.24 mg/dL Final  03/02/2019 06:48 AM 3.41 (H) 0.61 - 1.24 mg/dL Final  03/01/2019 06:13 AM 3.43 (H) 0.61 - 1.24 mg/dL Final  02/28/2019 04:20 AM 3.43 (H) 0.61 - 1.24 mg/dL Final  02/27/2019 04:58 AM 3.74 (H) 0.61 - 1.24 mg/dL Final  02/26/2019 04:18 AM 3.69 (H) 0.61 - 1.24 mg/dL Final  02/25/2019 04:52 AM 3.38 (H) 0.61 - 1.24 mg/dL Final  02/24/2019 04:34 AM 2.92 (H) 0.61 - 1.24 mg/dL Final  02/23/2019 02:40 PM 2.86 (H) 0.61 - 1.24 mg/dL Final  01/20/2018 02:02 PM 1.95 (H) 0.61 - 1.24 mg/dL Final  12/26/2017 08:56 AM 1.92 (H) 0.61 - 1.24 mg/dL Final    PMHx:   Past Medical History:  Diagnosis Date  . Arthritis   . Diabetes mellitus without complication ([place])   . Foot ulcer ([place])   . Hypertension   . Urinary retention     Past Surgical History:  Procedure Laterality Date  . ANKLE SURGERY    . CHOLECYSTECTOMY    . FOOT SURGERY      Family Hx: No family history on file.  Social History:  reports that he has never smoked. He has never used smokeless tobacco. He reports  that he does not drink alcohol or use drugs.  Allergies:  Allergies  Allergen Reactions  . Dust Mite Extract Itching and Other (See Comments)    Unknown reaction-potential shortness of breath  . Prednisone Nausea And Vomiting  . Rocephin [Ceftriaxone] Nausea And Vomiting    Medications: Prior to Admission medications   Medication Sig Start Date End Date Taking? Authorizing Provider  ACCU-CHEK SMARTVIEW test strip 1 each by Other route as needed.  05/15/19   [provider]  acetaminophen (TYLENOL) 325 MG tablet Take 650 mg by mouth as needed for mild pain or moderate pain.     [provider]  allopurinol (ZYLOPRIM) 300 MG tablet Take 1 tablet (300 mg total) by mouth daily. 03/20/19   Gerlene Fee, NP  Amino Acids-Protein Hydrolys  (FEEDING SUPPLEMENT, PRO-STAT SUGAR FREE 64,) LIQD Take 30 mLs by mouth 2 (two) times daily between meals.    [provider]  amLODipine (NORVASC) 10 MG tablet Take 1 tablet (10 mg total) by mouth at bedtime. 03/20/19   Gerlene Fee, NP  ascorbic acid (VITAMIN C) 500 MG tablet Take 500 mg by mouth daily.     [provider]  Brodalumab 210 MG/1.5ML SOSY Inject 210 mg into the skin every 14 (fourteen) days. 12/25/17   [provider]  cephALEXin (KEFLEX) 500 MG capsule  06/11/19   [provider]  collagenase (SANTYL) ointment Ointment; 250 unit/gram; topical  Special Instructions: Apply to wounds per treatment orders.    [provider]  Colloidal Oatmeal (ECZEMA MOISTURIZING EX) Apply 1 application topically daily as needed (applied to legs).    [provider]  doxycycline (ADOXA) 100 MG tablet  05/30/19   [provider]  [place] X 5/16" 1 ML [place]  04/28/19   [provider]  fluticasone (FLONASE) 50 MCG/ACT nasal spray Place 2 sprays into both nostrils daily.  02/11/19   [provider]  folic acid (FOLVITE) 1 MG tablet Take 1 tablet (1 mg total) by mouth daily. 03/20/19   Gerlene Fee, NP  insulin aspart protamine - aspart (NOVOLOG MIX 70/30 FLEXPEN) (70-30) 100 UNIT/ML FlexPen Inject 0.3 mLs (30 Units total) into the skin 2 (two) times daily with a meal. 03/20/19   Nyoka Cowden, Phylis Bougie, NP  linagliptin (TRADJENTA) 5 MG TABS tablet Take 1 tablet (5 mg total) by mouth daily. 03/20/19   Gerlene Fee, NP  Melatonin 1 MG CAPS Take 1 mg by mouth as needed. For insomnia    [provider]  metoprolol succinate (TOPROL-XL) 100 MG 24 hr tablet Take 1 tablet (100 mg total) by mouth every evening. 03/20/19   Gerlene Fee, NP  mirabegron ER (MYRBETRIQ) 25 MG TB24 tablet Take 1 tablet (25 mg total) by mouth daily. 07/02/19   [name], [name], MD  NON FORMULARY Diet: Regular, NAS, Consistent  Carbohydrate    [provider]  NOVOLIN 70/30 RELION (70-30) 100 UNIT/ML injection Inject 10 Units into the skin 2 (two) times daily with a meal.  05/01/19   [provider]  Omega-3 Fatty Acids (FISH OIL) 1000 MG CAPS Take by mouth.    [provider]  pravastatin (PRAVACHOL) 80 MG tablet Take 1 tablet (80 mg total) by mouth every evening. 03/20/19   Gerlene Fee, NP  silver nitrate applicators 83-15 % applicator Topical  Special Instructions: Apply to rolled borders of wound to right lateral malleolus daily as needed for epithelized borders.  Once A Day - PRN    [provider]  silver nitrate applicators 93-81 % applicator ; topical  Special Instructions: Apply to rolled borders of wound to left plantar foot daily as needed for epithelized borders. Once A Day - PRN PRN 1    [provider]  sulfamethoxazole-trimethoprim (BACTRIM DS) 800-160 MG tablet Take 1 tablet by mouth 2 (two) times daily. 06/30/19   Rolland Porter, MD  tamsulosin (FLOMAX) 0.4 MG CAPS capsule Take 1 capsule (0.4 mg total) by mouth daily after supper. 03/20/19   Gerlene Fee, NP  torsemide (DEMADEX) 20 MG tablet Take 1 tablet (20 mg total) by mouth daily. 03/20/19   Gerlene Fee, NP    I have reviewed the patient's current medications.  Labs:  BMP Latest Ref Rng & Units 07/08/2019 07/07/2019 06/26/2019  Glucose 70 - 99 mg/dL 77 168(H) 221(H)  BUN 8 - 23 mg/dL 68(H) 69(H) 51(H)  Creatinine 0.61 - 1.24 mg/dL 4.62(H) 4.57(H) 3.10(H)  Sodium 135 - 145 mmol/L 137 137 137  Potassium 3.5 - 5.1 mmol/L 5.0 4.8 4.0  Chloride 98 - 111 mmol/L 108 108 106  CO2 22 - 32 mmol/L 23 24 23   Calcium 8.9 - 10.3 mg/dL 8.7(L) 8.8(L) 8.4(L)    Urinalysis    Component Value Date/Time   COLORURINE YELLOW 07/07/2019 1644   APPEARANCEUR CLOUDY (A) 07/07/2019 1644   LABSPEC 1.010 07/07/2019 1644   PHURINE 5.0 07/07/2019 1644   GLUCOSEU NEGATIVE 07/07/2019 1644   HGBUR SMALL (A) 07/07/2019  [place] 07/07/2019 [place] 07/07/2019 1644   PROTEINUR 100 (A) 07/07/2019 1644   NITRITE NEGATIVE 07/07/2019 1644   LEUKOCYTESUR MODERATE (A) 07/07/2019 1644     ROS:  Pertinent items noted in HPI and remainder of comprehensive ROS otherwise negative.    Physical Exam: Vitals:   07/08/19 0700 07/08/19 0730  BP: (!) 145/69 (!) 153/74  Pulse: 61 67  Resp: 20 (!) 22  Temp:    SpO2: 96% 95%     General: Adult male in bed in no acute distress at rest HEENT: Normocephalic atraumatic Eyes: Extraocular movements intact sclera anicteric Neck: Supple trachea midline Heart S1-S2 no rub appreciated: Lungs: Reduced breath sounds with basilar crackles normal work of breathing on 2 L of oxygen at rest Abdomen: Distended abdomen which is nontender obese habitus Extremities: 2+ edema appreciated no cyanosis or Psych normal mood and affect conversant and good eye contact Neuro: Alert and oriented x3 provides a history follows commands  Assessment/Plan:  # AKI - Note rise in cr appears temporally related to recent Bactrim use- would avoid any bactrim given CKD  - Changed to renal diet carbohydrate modified with fluid restriction - lasix as below  - strict ins/outs - hold oral diuretic regimen for now  # CKD stage IV - Felt secondary to history of diabetes, hypertension, and chronic urinary retention - His baseline creatinine is near 3 - Follows with Dr. Theador Hawthorne  # Acute on  Chronic diastolic CHF - Lasix 80 mg IV once  - ok to redose once if needed for resp distress - Defer fluids  # HTN  - Continue home regimen for now  # Chronic urinary retention - Foley catheter is in place and he follows with urology as an outpatient  # Anemia CKD - likely secondary in part to CKD  - ESA here and update iron panel   # UTI  - Obtain urine culture -has been  on bactrim - would not restart   Claudia Desanctis 07/08/2019, 9:45 AM

## 2019-07-08 NOTE — ED Notes (Signed)
Pt on room air. 92%. Denies SOB

## 2019-07-08 NOTE — Consult Note (Signed)
Consultation completed by use of remote telehealth camera cart/Elink technology and with assistance from bedside nurse/clinical staff  Reason for Consult: leg wounds Patient self reports he is followed by Dr. [place] Crane 2x week for venous stasis ulcerations. As well as neuropathic foot ulceration noted on the left plantar surface.  Using dry boots and silver hydrofiber Wound type: Venous stasis disease   Neuropathic foot ulceration  Pressure Injury POA: NA Measurement: will ask nursing to place with first 2 RN assessment Wound bed: foot ulceration; pink; dry Scabbed areas over the medial, lateral and pretibial LEs.  Drainage (amount, consistency, odor) not able to assess, no dressings in place  Periwound: edema, documented palpable distal pulses  Dressing procedure/placement/frequency: Silver hydrofiber over open wounds; dry boots for compression with kerlix and coban. Change 2x week Q Tuesday and Fridays. Orders updated.   Follow up with Dr. [place] Crane at the time of DC.  Discussed POC with patient and bedside nurse.  Re consult if needed, will not follow at this time. Thanks  Rahi Chandonnet [employer], RN,CWOCN, CNS, [place] [phone])

## 2019-07-08 NOTE — H&P (Signed)
TRH H&P    Patient Demographics:    Danny [name], is a 64 y.o. male  MRN: [id number]  DOB - [birth year]  Admit Date - 07/07/2019  Referring MD/NP/PA: Dr. Roderic Palau  Outpatient Primary MD for the patient is Celene Squibb, MD  Patient coming from: Home  Chief complaint-shortness of breath   HPI:    Danny [name]  is a 64 y.o. male, with history of diabetes mellitus type 2, hypertension, urinary retention with chronic Foley catheter in place, CKD stage IV, chronic ulcers on foot and ankle came to hospital with complaints of shortness of breath for past 2 days.  Patient said that he has been coughing up phlegm with color ranging from white to brown.  He has history of diastolic CHF and is on torsemide at home.  In the ED, lab work showed BUN/creatinine of 69/4.57, patient's baseline creatinine is 3.10 as of June 26, 2019.  BNP is 207.  Patient's chest x-ray shows tiny right pleural effusion and mild bibasilar atelectasis.  He is requiring 1  L of oxygen via nasal cannula, O2 sats 96%.  He was recently seen in urology office for leaking Foley catheter, at that time Foley catheter was replaced.  He has failed voiding trial x5 in the past.  He denies nausea vomiting or diarrhea. Denies chest pain. Denies abdominal pain    Review of systems:    In addition to the HPI above,   All other systems reviewed and are negative.    Past History of the following :    Past Medical History:  Diagnosis Date  . Arthritis   . Diabetes mellitus without complication ([place])   . Foot ulcer ([place])   . Hypertension   . Urinary retention       Past Surgical History:  Procedure Laterality Date  . ANKLE SURGERY    . CHOLECYSTECTOMY    . FOOT SURGERY        Social History:      Social History   Tobacco Use  . Smoking status: Never Smoker  . Smokeless tobacco: Never Used  Substance Use Topics  . Alcohol use: No         Family History :   Patient sister had cancer, he is not sure what type of cancer she had.   Home Medications:   Prior to Admission medications   Medication Sig Start Date End Date Taking? Authorizing Provider  ACCU-CHEK SMARTVIEW test strip 1 each by Other route as needed.  05/15/19   [provider]  acetaminophen (TYLENOL) 325 MG tablet Take 650 mg by mouth as needed for mild pain or moderate pain.     [provider]  allopurinol (ZYLOPRIM) 300 MG tablet Take 1 tablet (300 mg total) by mouth daily. 03/20/19   Gerlene Fee, NP  Amino Acids-Protein Hydrolys (FEEDING SUPPLEMENT, PRO-STAT SUGAR FREE 64,) LIQD Take 30 mLs by mouth 2 (two) times daily between meals.    [provider]  amLODipine (NORVASC) 10 MG tablet Take 1 tablet (10 mg total)  by mouth at bedtime. 03/20/19   Gerlene Fee, NP  ascorbic acid (VITAMIN C) 500 MG tablet Take 500 mg by mouth daily.     [provider]  Brodalumab 210 MG/1.5ML SOSY Inject 210 mg into the skin every 14 (fourteen) days. 12/25/17   [provider]  cephALEXin (KEFLEX) 500 MG capsule  06/11/19   [provider]  collagenase (SANTYL) ointment Ointment; 250 unit/gram; topical  Special Instructions: Apply to wounds per treatment orders.    [provider]  Colloidal Oatmeal (ECZEMA MOISTURIZING EX) Apply 1 application topically daily as needed (applied to legs).    [provider]  doxycycline (ADOXA) 100 MG tablet  05/30/19   [provider]  [place] X 5/16" 1 ML [place]  04/28/19   [provider]  fluticasone (FLONASE) 50 MCG/ACT nasal spray Place 2 sprays into both nostrils daily.  02/11/19   [provider]  folic acid (FOLVITE) 1 MG tablet Take 1 tablet (1 mg total) by mouth daily. 03/20/19   Gerlene Fee, NP  insulin aspart protamine - aspart (NOVOLOG MIX 70/30 FLEXPEN) (70-30) 100 UNIT/ML FlexPen Inject 0.3 mLs (30 Units  total) into the skin 2 (two) times daily with a meal. 03/20/19   Nyoka Cowden, Phylis Bougie, NP  linagliptin (TRADJENTA) 5 MG TABS tablet Take 1 tablet (5 mg total) by mouth daily. 03/20/19   Gerlene Fee, NP  Melatonin 1 MG CAPS Take 1 mg by mouth as needed. For insomnia    [provider]  metoprolol succinate (TOPROL-XL) 100 MG 24 hr tablet Take 1 tablet (100 mg total) by mouth every evening. 03/20/19   Gerlene Fee, NP  mirabegron ER (MYRBETRIQ) 25 MG TB24 tablet Take 1 tablet (25 mg total) by mouth daily. 07/02/19   [name], [name], MD  NON FORMULARY Diet: Regular, NAS, Consistent Carbohydrate    [provider]  NOVOLIN 70/30 RELION (70-30) 100 UNIT/ML injection Inject 10 Units into the skin 2 (two) times daily with a meal.  05/01/19   [provider]  Omega-3 Fatty Acids (FISH OIL) 1000 MG CAPS Take by mouth.    [provider]  pravastatin (PRAVACHOL) 80 MG tablet Take 1 tablet (80 mg total) by mouth every evening. 03/20/19   Gerlene Fee, NP  silver nitrate applicators 06-00 % applicator Topical  Special Instructions: Apply to rolled borders of wound to right lateral malleolus daily as needed for epithelized borders. Once A Day - PRN    [provider]  silver nitrate applicators 45-99 % applicator ; topical  Special Instructions: Apply to rolled borders of wound to left plantar foot daily as needed for epithelized borders. Once A Day - PRN PRN 1    [provider]  sulfamethoxazole-trimethoprim (BACTRIM DS) 800-160 MG tablet Take 1 tablet by mouth 2 (two) times daily. 06/30/19   Rolland Porter, MD  tamsulosin (FLOMAX) 0.4 MG CAPS capsule Take 1 capsule (0.4 mg total) by mouth daily after supper. 03/20/19   Gerlene Fee, NP  torsemide (DEMADEX) 20 MG tablet Take 1 tablet (20 mg total) by mouth daily. 03/20/19   Gerlene Fee, NP     Allergies:     Allergies  Allergen Reactions  . Dust Mite Extract Itching and Other (See Comments)      Unknown reaction-potential shortness of breath  . Prednisone Nausea And Vomiting  . Rocephin [Ceftriaxone] Nausea And Vomiting     Physical Exam:  Vitals  Blood pressure (!) 149/66, pulse 62, temperature 98.3 F (36.8 C), temperature source Oral, resp. rate 16, height 6' (1.829 m), weight (!) 145.2 kg, SpO2 96 %.  1.  General: Appears in no acute distress  2. Psychiatric: Alert, oriented x3, intact insight and judgment  3. Neurologic: Cranial nerves II through XII grossly intact, motor strength 5/5 in all extremities  4. HEENMT:  Atraumatic normocephalic, extraocular muscles are intact  5. Respiratory : Clear to auscultation bilaterally, no wheezing or crackles auscultated  6. Cardiovascular : S1-S2, regular, no murmur auscultated  7. Gastrointestinal:  Abdomen is soft, nontender, no organomegaly.  8.  Skin: Bilateral lower extremities has open areas, mild erythema no discharge noted.    Data Review:    CBC Recent Labs  Lab 07/07/19 1700  WBC 8.0  HGB 7.8*  HCT 24.7*  PLT 188  MCV 90.5  MCH 28.6  MCHC 31.6  RDW 18.6*  LYMPHSABS 0.9  MONOABS 0.5  EOSABS 0.1  BASOSABS 0.0   ------------------------------------------------------------------------------------------------------------------  Results for orders placed or performed during the hospital encounter of 07/07/19 (from the past 48 hour(s))  Urinalysis, Routine w reflex microscopic     Status: Abnormal   Collection Time: 07/07/19  4:44 PM  Result Value Ref Range   Color, Urine YELLOW YELLOW   APPearance CLOUDY (A) CLEAR   Specific Gravity, Urine 1.010 1.005 - 1.030   pH 5.0 5.0 - 8.0   Glucose, UA NEGATIVE NEGATIVE mg/dL   Hgb urine dipstick SMALL (A) NEGATIVE   Bilirubin Urine NEGATIVE NEGATIVE   Ketones, ur NEGATIVE NEGATIVE mg/dL   Protein, ur 100 (A) NEGATIVE mg/dL   Nitrite NEGATIVE NEGATIVE   Leukocytes,Ua MODERATE (A) NEGATIVE   RBC / HPF >50 (H) 0 - 5 RBC/hpf   WBC, UA 21-50 0 -  5 WBC/hpf   Bacteria, UA MANY (A) NONE SEEN   Mucus PRESENT    Budding Yeast PRESENT     Comment: Performed at [facility], [address], [place], [place] [address]  CBC with Differential     Status: Abnormal   Collection Time: 07/07/19  5:00 PM  Result Value Ref Range   WBC 8.0 4.0 - 10.5 K/uL   RBC 2.73 (L) 4.22 - 5.81 MIL/uL   Hemoglobin 7.8 (L) 13.0 - 17.0 g/dL   HCT 24.7 (L) 39.0 - 52.0 %   MCV 90.5 80.0 - 100.0 fL   MCH 28.6 26.0 - 34.0 pg   MCHC 31.6 30.0 - 36.0 g/dL   RDW 18.6 (H) 11.5 - 15.5 %   Platelets 188 150 - 400 K/uL   nRBC 0.0 0.0 - 0.2 %   Neutrophils Relative % 79 %   Neutro Abs 6.4 1.7 - 7.7 K/uL   Lymphocytes Relative 11 %   Lymphs Abs 0.9 0.7 - 4.0 K/uL   Monocytes Relative 6 %   Monocytes Absolute 0.5 0.1 - 1.0 K/uL   Eosinophils Relative 2 %   Eosinophils Absolute 0.1 0.0 - 0.5 K/uL   Basophils Relative 1 %   Basophils Absolute 0.0 0.0 - 0.1 K/uL   Immature Granulocytes 1 %   Abs Immature Granulocytes 0.05 0.00 - 0.07 K/uL    Comment: Performed at [facility], [address], [place], [place] [address]  Comprehensive metabolic panel     Status: Abnormal   Collection Time: 07/07/19  5:00 PM  Result Value Ref Range   Sodium 137 135 - 145 mmol/L   Potassium 4.8 3.5 -  5.1 mmol/L   Chloride 108 98 - 111 mmol/L   CO2 24 22 - 32 mmol/L   Glucose, Bld 168 (H) 70 - 99 mg/dL   BUN 69 (H) 8 - 23 mg/dL   Creatinine, Ser 4.57 (H) 0.61 - 1.24 mg/dL   Calcium 8.8 (L) 8.9 - 10.3 mg/dL   Total Protein 7.2 6.5 - 8.1 g/dL   Albumin 3.2 (L) 3.5 - 5.0 g/dL   AST 48 (H) 15 - 41 U/L   ALT 56 (H) 0 - 44 U/L   Alkaline Phosphatase 106 38 - 126 U/L   Total Bilirubin 1.1 0.3 - 1.2 mg/dL   GFR calc non Af Amer 13 (L) >60 mL/min   GFR calc Af Amer 15 (L) >60 mL/min   Anion gap 5 5 - 15    Comment: Performed at [facility], [address], [place], [place] [address]  Brain natriuretic peptide     Status: Abnormal   Collection Time: 07/07/19  5:00 PM  Result Value Ref  Range   B Natriuretic Peptide 207.0 (H) 0.0 - 100.0 pg/mL    Comment: Performed at [facility], [address] [place], [place] [address]  Troponin I (High Sensitivity)     Status: None   Collection Time: 07/07/19  5:00 PM  Result Value Ref Range   Troponin I (High Sensitivity) 6 <18 ng/L    Comment: (NOTE) Elevated high sensitivity troponin I (hsTnI) values and significant  changes across serial measurements may suggest ACS but many other  chronic and acute conditions are known to elevate hsTnI results.  Refer to the "Links" section for chest pain algorithms and additional  guidance. Performed at [facility], [address] [place], [place] [address]   POC SARS Coronavirus 2 Ag-ED - Nasal Swab (BD Veritor Kit)     Status: None   Collection Time: 07/07/19  5:17 PM  Result Value Ref Range   SARS Coronavirus 2 Ag NEGATIVE NEGATIVE    Comment: (NOTE) SARS-CoV-2 antigen NOT DETECTED.  Negative results are presumptive.  Negative results do not preclude SARS-CoV-2 infection and should not be used as the sole basis for treatment or other patient management decisions, including infection  control decisions, particularly in the presence of clinical signs and  symptoms consistent with COVID-19, or in those who have been in contact with the virus.  Negative results must be combined with clinical observations, patient history, and epidemiological information. The expected result is Negative. Fact Sheet for Patients: PodPark.tn Fact Sheet for Healthcare Providers: GiftContent.is This test is not yet approved or cleared by the [place] FDA and  has been authorized for detection and/or diagnosis of SARS-CoV-2 by FDA under an Emergency Use Authorization (EUA).  This EUA will remain in effect (meaning this test can be used) for the duration of  the COVID-19 de claration under Section 564(b)(1) of the Act, 21 U.S.C. section  360bbb-3(b)(1), unless the authorization is terminated or revoked sooner.   Troponin I (High Sensitivity)     Status: None   Collection Time: 07/07/19 11:45 PM  Result Value Ref Range   Troponin I (High Sensitivity) 6 <18 ng/L    Comment: (NOTE) Elevated high sensitivity troponin I (hsTnI) values and significant  changes across serial measurements may suggest ACS but many other  chronic and acute conditions are known to elevate hsTnI results.  Refer to the "Links" section for chest pain algorithms and additional  guidance. Performed at [facility], [address], [place],  [place] [address]     Chemistries  Recent Labs  Lab 07/07/19 1700  NA 137  K 4.8  CL 108  CO2 24  GLUCOSE 168*  BUN 69*  CREATININE 4.57*  CALCIUM 8.8*  AST 48*  ALT 56*  ALKPHOS 106  BILITOT 1.1   ------------------------------------------------------------------------------------------------------------------  ------------------------------------------------------------------------------------------------------------------ GFR: Estimated Creatinine Clearance: 23.8 mL/min (A) (by C-G formula based on SCr of 4.57 mg/dL (H)). Liver Function Tests: Recent Labs  Lab 07/07/19 1700  AST 48*  ALT 56*  ALKPHOS 106  BILITOT 1.1  PROT 7.2  ALBUMIN 3.2*   No results for input(s): LIPASE, AMYLASE in the last 168 hours. No results for input(s): AMMONIA in the last 168 hours. Coagulation Profile: No results for input(s): INR, PROTIME in the last 168 hours. Cardiac Enzymes: No results for input(s): CKTOTAL, CKMB, CKMBINDEX, TROPONINI in the last 168 hours. BNP (last 3 results) No results for input(s): PROBNP in the last 8760 hours. HbA1C: No results for input(s): HGBA1C in the last 72 hours. CBG: No results for input(s): GLUCAP in the last 168 hours. Lipid Profile: No results for input(s): CHOL, HDL, LDLCALC, TRIG, CHOLHDL, LDLDIRECT in the last 72 hours. Thyroid Function Tests: No results for  input(s): TSH, T4TOTAL, FREET4, T3FREE, THYROIDAB in the last 72 hours. Anemia Panel: No results for input(s): VITAMINB12, FOLATE, FERRITIN, TIBC, IRON, RETICCTPCT in the last 72 hours.  --------------------------------------------------------------------------------------------------------------- Urine analysis:    Component Value Date/Time   COLORURINE YELLOW 07/07/2019 1644   APPEARANCEUR CLOUDY (A) 07/07/2019 1644   LABSPEC 1.010 07/07/2019 1644   PHURINE 5.0 07/07/2019 1644   GLUCOSEU NEGATIVE 07/07/2019 1644   HGBUR SMALL (A) 07/07/2019 1644   BILIRUBINUR NEGATIVE 07/07/2019 [place] 07/07/2019 1644   PROTEINUR 100 (A) 07/07/2019 1644   NITRITE NEGATIVE 07/07/2019 1644   LEUKOCYTESUR MODERATE (A) 07/07/2019 1644      Imaging Results:    DG Chest Portable 1 View  Result Date: 07/07/2019 CLINICAL DATA:  Shortness of breath since last night, nonproductive cough, history CHF, hypertension, diabetes mellitus EXAM: PORTABLE CHEST 1 VIEW COMPARISON:  Portable exam 1702 hours compared to 06/26/2019 FINDINGS: Enlargement of cardiac silhouette. Mediastinal contours and pulmonary vascularity normal. Minimal RIGHT pleural effusion and bibasilar atelectasis. Remaining lungs clear. No pneumothorax or acute osseous findings. IMPRESSION: Tiny RIGHT pleural effusion and mild bibasilar atelectasis. Electronically Signed   By: Lavonia Dana M.D.   On: 07/07/2019 17:20    My personal review of EKG: Rhythm NSR, no ST changes.   Assessment & Plan:    Active Problems:   AKI (acute kidney injury) ([place])   1. Acute kidney injury on CKD stage IV-patient presenting with worsening BUN/creatinine, baseline creatinine 3.10, today creatinine 4.57.  Patient is on torsemide at home, will hold torsemide.  Patient has history of bladder neck obstruction, Foley catheter was recently changed at urologist office.  Will obtain renal ultrasound in a.m to rule out hydronephrosis.  We will keep him  KVO, will avoid giving IV fluids as he has history of diastolic CHF.  If renal ultrasound is normal, can try gentle IV hydration in a.m.  Consider nephrology consultation in a.m.  2. Acute hypoxic respiratory failure-patient has mild hypoxia, requiring 1 L of oxygen, O2 sats 96%.  Chest x-ray only shows tiny pleural effusion and bibasilar atelectasis.  SARS Covid 2 test is negative.  Patient has history of HFpEF, last echo showed EF greater than 65%.  He takes torsemide at home.  Torsemide is currently  on hold due to above.  BNP is only 207.  No infiltrate seen on chest x-ray.  Consider restarting diuretics after discussion with nephrology.   3. Diabetes mellitus type 2-patient takes Novolin 70/30, 10 units twice a day at home.  We will continue with Novolin 70/30, also start sliding scale insulin with NovoLog.  Hold oral hypoglycemic agents.  4. Hypertension-blood pressure stable, continue metoprolol.  5. Chronic leg ulcers-we will consult wound care   DVT Prophylaxis-   Heparin  AM Labs Ordered, also please review Full Orders  Family Communication: Admission, patients condition and plan of care including tests being ordered have been discussed with the patient  who indicate understanding and agree with the plan and Code Status.  Code Status: Full code  Admission status: Inpatient: Based on patients clinical presentation and evaluation of above clinical data, I have made determination that patient meets Inpatient criteria at this time.  Time spent in minutes : 60 minutes   Mickie Kozikowski S Pamela Intrieri M.D

## 2019-07-08 NOTE — ED Notes (Signed)
Pt removed from oxygen for room air trial

## 2019-07-08 NOTE — Progress Notes (Signed)
Inpatient Diabetes Program Recommendations  AACE/ADA: New Consensus Statement on Inpatient Glycemic Control (2015)  Target Ranges:  Prepandial:   less than 140 mg/dL      Peak postprandial:   less than 180 mg/dL (1-2 hours)      Critically ill patients:  140 - 180 mg/dL   Lab Results  Component Value Date   GLUCAP 110 (H) 07/08/2019   HGBA1C 7.0 (H) 07/07/2019    Review of Glycemic Control Results for Danny [name], Danny [name] (MRN [id number]) as of 07/08/2019 11:39  Ref. Range 07/08/2019 07:22 07/08/2019 07:54  Glucose-Capillary Latest Ref Range: 70 - 99 mg/dL 62 (L) 110 (H)   Diabetes history: DM 2 Outpatient Diabetes medications: Tradjenta 5 mg Daily, 70/30 40 units bid Current orders for Inpatient glycemic control:  70/30 10 units bid Novolog 0-9 units tid  A1c 7% on 07/07/19  Glucose 62 this am.  Inpatient Diabetes Program Recommendations:    Noted 70/30 insulin ordered. Pt had hypoglycemia in the 60's this am. Would hold 70/23m until glucose trends increase consistently over 180 mg/dl.   Thanks,  Danny Headings RN, MSN, BC-ADM Inpatient Diabetes Coordinator Team Pager [phone] (8a-5p)

## 2019-07-08 NOTE — ED Notes (Signed)
Have given pt a meal tray  

## 2019-07-08 NOTE — ED Notes (Addendum)
Wound Care in with patient

## 2019-07-08 NOTE — Progress Notes (Addendum)
PROGRESS NOTE [facility]   Danny [name]  EHU:[id number]  DOB: 1956-03-02  DOA: 07/07/2019 PCP: Celene Squibb, MD   Brief Admission Hx: 64 y.o. male, with history of diabetes mellitus type 2, hypertension, urinary retention with chronic Foley catheter in place, CKD stage IV, chronic ulcers on foot and ankle came to hospital with complaints of shortness of breath for past 2 days.  Patient said that he has been coughing up phlegm with color ranging from white to brown.  He has history of diastolic CHF and is on torsemide at home.  He was admitted with AKI on CKD stage 4, diastolic CHF exacerbation and acute hypoxic respiratory failure.   MDM/Assessment & Plan:   1. AKI on CKD stage 4 - I spoke with Dr. Royce Macadamia with nephrology who agreed to consult on him.  It was felt that the recent use of bactrim contributed to his acute kidney injury.  Renal US was reviewed and no obstruction was seen.  Will follow nephrology recommendations.  Renal function panel in AM.  2. Acute hypoxic respiratory failure - Pt has been feeling better with supplemental oxygen, he remains on 1L [place].  Sars 2 covid test is negative.   3. Acute diastolic CHF - He has already been treated with a dose of IV lasix.  Per nephrology team holding home diuretics for now to allow for renal recovery.  4. Type 2 diabetes mellitus - He had hypoglycemia this morning likely from AKI and insulin on board.  I have reduced his insulin by more than 50 % and will follow CBG closely.   Holding home oral hypoglycemics.  5. Essential hypertension - resumed home metoprolol and follow.  6. Chronic leg ulcerations - wound care team consulted.  7. UTI - he had been treated with Bactrim which we are avoiding to use in the future for him.  Urine culture requested. He does not have symptoms.  8. Ascites- abd [place] with findings of small volume ascites.  I doubt there would be enough fluid to tap.  Monitor.   9. Chronic urinary retention - he has failed  multiple voiding trials and had foley changed out by [facility]  in last couple of days.    DVT prophylaxis: heparin  Code Status: Full  Family Communication: patient updated at bedside, has full capacity, verbalizes understanding of care plan Disposition Plan: continue inpatient care for further subspecialty consultation,management and following electrolytes and renal function closely, he is not medically stable to discharge from hospital at this time   Consultants:  Nephrology   Procedures:  Renal ultrasound   Antimicrobials:  n/a   Subjective: Pt awake and alert, remembers me from previous admission, says abdomen feels full and distended, SOB better with oxygen  Objective: Vitals:   07/08/19 0700 07/08/19 0730 07/08/19 1115 07/08/19 1437  BP: (!) 145/69 (!) 153/74 (!) 146/103 (!) 162/69  Pulse: 61 67 62 75  Resp: 20 (!) 22 16 19   Temp:      TempSrc:      SpO2: 96% 95% 95% 94%  Weight:      Height:       No intake or output data in the 24 hours ending 07/08/19 1559 Filed Weights   07/07/19 1625  Weight: (!) 145.2 kg   REVIEW OF SYSTEMS  As per history otherwise all reviewed and reported negative  Exam:  General exam: morbidly obese pleasant male, NAD, cooperative.  Respiratory system: bibasilar crackles heard posteriorly. No increased work of  breathing. Cardiovascular system: S1 & S2 heard. No JVD, murmurs, gallops, clicks or pedal edema. Gastrointestinal system: Abdomen is obese, mildly distended, soft and nontender. Normal bowel sounds heard. GU: foley in place, amber urine seen in bag.  Central nervous system: Alert and oriented. No focal neurological deficits. Extremities: no CCE.  Data Reviewed: Basic Metabolic Panel: Recent Labs  Lab 07/07/19 1700 07/08/19 0442  NA 137 137  K 4.8 5.0  CL 108 108  CO2 24 23  GLUCOSE 168* 77  BUN 69* 68*  CREATININE 4.57* 4.62*  CALCIUM 8.8* 8.7*   Liver Function Tests: Recent Labs  Lab  07/07/19 1700 07/08/19 0442  AST 48* 37  ALT 56* 53*  ALKPHOS 106 99  BILITOT 1.1 1.0  PROT 7.2 6.8  ALBUMIN 3.2* 3.0*   No results for input(s): LIPASE, AMYLASE in the last 168 hours. No results for input(s): AMMONIA in the last 168 hours. CBC: Recent Labs  Lab 07/07/19 1700 07/08/19 0442  WBC 8.0 7.9  NEUTROABS 6.4  --   HGB 7.8* 7.4*  HCT 24.7* 24.0*  MCV 90.5 91.6  PLT 188 170   Cardiac Enzymes: No results for input(s): CKTOTAL, CKMB, CKMBINDEX, TROPONINI in the last 168 hours. CBG (last 3)  Recent Labs    07/08/19 0722 07/08/19 0754 07/08/19 1158  GLUCAP 62* 110* 185*   Recent Results (from the past 240 hour(s))  Urine culture     Status: Abnormal   Collection Time: 06/30/19  1:45 AM   Specimen: Urine, Catheterized  Result Value Ref Range Status   Specimen Description   Final    URINE, CATHETERIZED Performed at [facility], [address] [place], [place] [address]    Special Requests   Final    Normal Performed at [facility], [address], [place], [place] [address]    Culture >=100,000 COLONIES/mL YEAST (A)  Final   Report Status 07/01/2019 FINAL  Final  SARS CORONAVIRUS 2 (TAT 6-24 HRS) Nasopharyngeal Nasopharyngeal Swab     Status: None   Collection Time: 07/07/19  9:51 PM   Specimen: Nasopharyngeal Swab  Result Value Ref Range Status   SARS Coronavirus 2 NEGATIVE NEGATIVE Final    Comment: (NOTE) SARS-CoV-2 target nucleic acids are NOT DETECTED. The SARS-CoV-2 RNA is generally detectable in upper and lower respiratory specimens during the acute phase of infection. Negative results do not preclude SARS-CoV-2 infection, do not rule out co-infections with other pathogens, and should not be used as the sole basis for treatment or other patient management decisions. Negative results must be combined with clinical observations, patient history, and epidemiological information. The expected result is Negative. Fact Sheet for  Patients: SugarRoll.be Fact Sheet for Healthcare Providers: https://www.woods-mathews.com/ This test is not yet approved or cleared by the [place] FDA and  has been authorized for detection and/or diagnosis of SARS-CoV-2 by FDA under an Emergency Use Authorization (EUA). This EUA will remain  in effect (meaning this test can be used) for the duration of the COVID-19 declaration under Section 56 4(b)(1) of the Act, 21 U.S.C. section 360bbb-3(b)(1), unless the authorization is terminated or revoked sooner. Performed at [facility], [place] [address], [place], [place] [address]   Respiratory Panel by RT PCR (Flu A&B, Covid) - Nasopharyngeal Swab     Status: None   Collection Time: 07/08/19 12:47 AM   Specimen: Nasopharyngeal Swab  Result Value Ref Range Status   SARS Coronavirus 2 by RT PCR NEGATIVE NEGATIVE Final  Comment: (NOTE) SARS-CoV-2 target nucleic acids are NOT DETECTED. The SARS-CoV-2 RNA is generally detectable in upper respiratoy specimens during the acute phase of infection. The lowest concentration of SARS-CoV-2 viral copies this assay can detect is 131 copies/mL. A negative result does not preclude SARS-Cov-2 infection and should not be used as the sole basis for treatment or other patient management decisions. A negative result may occur with  improper specimen collection/handling, submission of specimen other than nasopharyngeal swab, presence of viral mutation(s) within the areas targeted by this assay, and inadequate number of viral copies (<131 copies/mL). A negative result must be combined with clinical observations, patient history, and epidemiological information. The expected result is Negative. Fact Sheet for Patients:  PinkCheek.be Fact Sheet for Healthcare Providers:  GravelBags.it This test is not yet ap proved or cleared by the [place] FDA  and  has been authorized for detection and/or diagnosis of SARS-CoV-2 by FDA under an Emergency Use Authorization (EUA). This EUA will remain  in effect (meaning this test can be used) for the duration of the COVID-19 declaration under Section 564(b)(1) of the Act, 21 U.S.C. section 360bbb-3(b)(1), unless the authorization is terminated or revoked sooner.    Influenza A by PCR NEGATIVE NEGATIVE Final   Influenza B by PCR NEGATIVE NEGATIVE Final    Comment: (NOTE) The Xpert Xpress SARS-CoV-2/FLU/RSV assay is intended as an aid in  the diagnosis of influenza from Nasopharyngeal swab specimens and  should not be used as a sole basis for treatment. Nasal washings and  aspirates are unacceptable for Xpert Xpress SARS-CoV-2/FLU/RSV  testing. Fact Sheet for Patients: PinkCheek.be Fact Sheet for Healthcare Providers: GravelBags.it This test is not yet approved or cleared by the [place] FDA and  has been authorized for detection and/or diagnosis of SARS-CoV-2 by  FDA under an Emergency Use Authorization (EUA). This EUA will remain  in effect (meaning this test can be used) for the duration of the  Covid-19 declaration under Section 564(b)(1) of the Act, 21  U.S.C. section 360bbb-3(b)(1), unless the authorization is  terminated or revoked. Performed at [facility], [address] [place], [place] [address]      Studies: US RENAL  Result Date: 07/08/2019 CLINICAL DATA:  Acute kidney injury. EXAM: RENAL / URINARY TRACT ULTRASOUND COMPLETE COMPARISON:  Renal ultrasound 02/24/2019 FINDINGS: Right Kidney: Renal measurements: 10.9 x 5.4 x 5.8 cm = volume: 182 mL. No hydronephrosis. Mildly increased renal cortical echogenicity. Redemonstrated exophytic simple appearing interpolar cyst measuring 2.4 x 2.4 x 2.5 cm. Left Kidney: Renal measurements: 11.8 x 6.1 x 4.9 cm = volume: 184 mL. No hydronephrosis. Mildly increased renal cortical  echogenicity. Bladder: The bladder is collapsed around a Foley catheter. Other: Small volume pelvic ascites. IMPRESSION: Increased renal cortical echogenicity bilaterally, suggestive of chronic renal parenchymal disease. 2.5 cm simple appearing right renal cyst. No hydronephrosis. Bladder collapsed around Foley catheter. Small volume pelvic ascites. Electronically Signed   By: Kellie Simmering DO   On: 07/08/2019 09:25   DG Chest Portable 1 View  Result Date: 07/07/2019 CLINICAL DATA:  Shortness of breath since last night, nonproductive cough, history CHF, hypertension, diabetes mellitus EXAM: PORTABLE CHEST 1 VIEW COMPARISON:  Portable exam 1702 hours compared to 06/26/2019 FINDINGS: Enlargement of cardiac silhouette. Mediastinal contours and pulmonary vascularity normal. Minimal RIGHT pleural effusion and bibasilar atelectasis. Remaining lungs clear. No pneumothorax or acute osseous findings. IMPRESSION: Tiny RIGHT pleural effusion and mild bibasilar atelectasis. Electronically Signed   By: Crist Infante.D.  On: 07/07/2019 17:20   Scheduled Meds: . allopurinol  300 mg Oral Daily  . amLODipine  10 mg Oral QHS  . darbepoetin (ARANESP) injection - NON-DIALYSIS  25 mcg Subcutaneous Once  . folic acid  1 mg Oral Daily  . furosemide  80 mg Intravenous Once  . guaiFENesin  600 mg Oral BID  . heparin  5,000 Units Subcutaneous Q8H  . insulin aspart  0-9 Units Subcutaneous TID WC  . insulin aspart protamine- aspart  4 Units Subcutaneous BID WC  . metoprolol succinate  100 mg Oral QPM  . mirabegron ER  25 mg Oral Daily  . pravastatin  80 mg Oral QPM  . sodium chloride flush  3 mL Intravenous Q12H  . tamsulosin  0.4 mg Oral QPC supper   Continuous Infusions: . sodium chloride      Active Problems:   Essential hypertension   Diabetes mellitus (HCC)   CKD (chronic kidney disease)   Acute renal failure superimposed on stage 3 chronic kidney disease (HCC)   Congestive heart failure (HCC)   Acute on  chronic diastolic (congestive) heart failure (HCC)   Urinary retention   Controlled type 2 diabetes mellitus with stage 3 chronic kidney disease, with long-term current use of insulin ([place])   Dyslipidemia associated with type 2 diabetes mellitus (HCC)   Chronic gout due to renal impairment without tophus   Bladder outlet obstruction   AKI (acute kidney injury) ([place])   Hypoxia   Stage I pressure ulcer of right ankle   Time spent: 32 minutes   Irwin Brakeman, MD [facility] 07/08/2019, 3:59 PM    LOS: 0 days  How to contact the [facility] Attending or Consulting provider [place] or covering provider during after hours 7P -7A, for this patient?  1. Check the care team in [facility] and look for a) attending/consulting [facility] provider listed and b) the [facility] team listed 2. Log into [email or web address] and use [facility]'s universal password to access. If you do not have the password, please contact the hospital operator. 3. Locate the [facility] provider you are looking for under [facility] and page to a number that you can be directly reached. 4. If you still have difficulty reaching the provider, please page the [facility] (Director on Call) for the Hospitalists listed on amion for assistance.

## 2019-07-08 NOTE — ED Notes (Signed)
hospitalist in rom

## 2019-07-09 ENCOUNTER — Inpatient Hospital Stay (HOSPITAL_COMMUNITY): Payer: Medicare HMO

## 2019-07-09 DIAGNOSIS — N184 Chronic kidney disease, stage 4 (severe): Secondary | ICD-10-CM

## 2019-07-09 LAB — RENAL FUNCTION PANEL
Albumin: 3 g/dL — ABNORMAL LOW (ref 3.5–5.0)
Anion gap: 9 (ref 5–15)
BUN: 73 mg/dL — ABNORMAL HIGH (ref 8–23)
CO2: 23 mmol/L (ref 22–32)
Calcium: 8.6 mg/dL — ABNORMAL LOW (ref 8.9–10.3)
Chloride: 105 mmol/L (ref 98–111)
Creatinine, Ser: 4.68 mg/dL — ABNORMAL HIGH (ref 0.61–1.24)
GFR calc Af Amer: 14 mL/min — ABNORMAL LOW (ref >60)
GFR calc non Af Amer: 12 mL/min — ABNORMAL LOW (ref >60)
Glucose, Bld: 187 mg/dL — ABNORMAL HIGH (ref 70–99)
Phosphorus: 5.2 mg/dL — ABNORMAL HIGH (ref 2.5–4.6)
Potassium: 5.1 mmol/L (ref 3.5–5.1)
Sodium: 137 mmol/L (ref 135–145)

## 2019-07-09 LAB — MAGNESIUM: Magnesium: 2.3 mg/dL (ref 1.7–2.4)

## 2019-07-09 LAB — BLOOD GAS, ARTERIAL
Acid-base deficit: 2.2 mmol/L — ABNORMAL HIGH (ref 0.0–2.0)
Bicarbonate: 22.2 mmol/L (ref 20.0–28.0)
FIO2: 32
O2 Saturation: 95.9 %
Patient temperature: 37
pCO2 arterial: 53.5 mmHg — ABNORMAL HIGH (ref 32.0–48.0)
pH, Arterial: 7.269 — ABNORMAL LOW (ref 7.350–7.450)
pO2, Arterial: 82.1 mmHg — ABNORMAL LOW (ref 83.0–108.0)

## 2019-07-09 LAB — CBC
HCT: 23.6 % — ABNORMAL LOW (ref 39.0–52.0)
Hemoglobin: 7.3 g/dL — ABNORMAL LOW (ref 13.0–17.0)
MCH: 28.4 pg (ref 26.0–34.0)
MCHC: 30.9 g/dL (ref 30.0–36.0)
MCV: 91.8 fL (ref 80.0–100.0)
Platelets: 169 10*3/uL (ref 150–400)
RBC: 2.57 MIL/uL — ABNORMAL LOW (ref 4.22–5.81)
RDW: 18.7 % — ABNORMAL HIGH (ref 11.5–15.5)
WBC: 8.7 10*3/uL (ref 4.0–10.5)
nRBC: 0 % (ref 0.0–0.2)

## 2019-07-09 LAB — MRSA PCR SCREENING: MRSA by PCR: NEGATIVE

## 2019-07-09 LAB — GLUCOSE, CAPILLARY
Glucose-Capillary: 191 mg/dL — ABNORMAL HIGH (ref 70–99)
Glucose-Capillary: 177 mg/dL — ABNORMAL HIGH (ref 70–99)
Glucose-Capillary: 174 mg/dL — ABNORMAL HIGH (ref 70–99)

## 2019-07-09 MED ORDER — CHLORHEXIDINE GLUCONATE CLOTH 2 % EX PADS
6.0000 | MEDICATED_PAD | Freq: Every day | CUTANEOUS | Status: DC
  Administered 2019-07-09 – 2019-07-15 (×7): 6 via TOPICAL

## 2019-07-09 MED ORDER — SODIUM CHLORIDE 0.9 % IV SOLN
510.0000 mg | Freq: Once | INTRAVENOUS | Status: AC
  Administered 2019-07-09: 510 mg via INTRAVENOUS
  Filled 2019-07-09: qty 17

## 2019-07-09 MED ORDER — FUROSEMIDE 10 MG/ML IJ SOLN
INTRAMUSCULAR | Status: AC
  Filled 2019-07-09: qty 4

## 2019-07-09 MED ORDER — FUROSEMIDE 10 MG/ML IJ SOLN
80.0000 mg | INTRAMUSCULAR | Status: AC
  Administered 2019-07-09: 80 mg via INTRAVENOUS
  Filled 2019-07-09: qty 8

## 2019-07-09 MED ORDER — FUROSEMIDE 10 MG/ML IJ SOLN
80.0000 mg | Freq: Once | INTRAMUSCULAR | Status: AC
  Administered 2019-07-09: 80 mg via INTRAVENOUS
  Filled 2019-07-09: qty 8

## 2019-07-09 NOTE — Progress Notes (Addendum)
[facility] Progress Note  Name: Danny [name] MRN: [id number] DOB: May 28, 1956  Chief Complaint:  Shortness of breath   Subjective:  Patient had 2 liters UOP over 1/12 charted.  He states has been very sleepy this - hardly slept at all last night.  Describes his breathing as "shallow".  States that he doesn't normally use CPAP at home.  Has been on 3 liter oxygen.  Does want HD if it is needed   Review of systems:  Reports shortness of breath  No n/v Has been up in the chair  Chronic foley   ----------- Background on consult:  Wilma Wuthrich is a 64 y.o. male with a history of diabetes mellitus, chronic urinary retention with Foley catheter in place, CKD stage IV, and chronic diastolic CHF who presented to the hospital with shortness of breath.  He feels swollen especially in his abdomen.  He also reported coughing up phlegm.  Also feels cold and is wearing a blanket in the ER.  He has been on 2 L of oxygen this morning. He has had a respiratory viral panel and a separate Covid test jere which are both negative for Covid.  Influenza is negative.  Note that with regard to his Foley catheter, he has failed multiple voiding trials in the past and was recently seen by urology in the office and his Foley was exchanged due to leaking at that time.  Note that he was recently given Bactrim - on 1/4 for a UTI.  States that he was scheduled to start injections to raise his blood [facility] however missed the appointment due to coming in.  He has not received any of these injections before as an outpatient.  He states that his iron is low as well.  His brother had renal failure but was not on dialysis-the patient's brother has passed away unfortunately.  Renal ultrasound earlier today with no hydro and bladder collapsed around foley..  Small volume pelvic ascites.  He follows with Dr. Theador Hawthorne here in the area.  He does not have a fistula in place.  He would want dialysis if it  were needed  Intake/Output Summary (Last 24 hours) at 07/09/2019 0817 Last data filed at 07/09/2019 0530 Gross per 24 hour  Intake 480 ml  Output 2000 ml  Net -1520 ml    Vitals:  Vitals:   07/08/19 1949 07/08/19 2204 07/09/19 0233 07/09/19 0553  BP:  (!) 141/66  138/63  Pulse:  63  64  Resp:  20  18  Temp:  (!) 97.3 F (36.3 C)  97.6 F (36.4 C)  TempSrc:  Oral  Oral  SpO2: (!) 87% 97% 93% 94%  Weight:      Height:         Physical Exam:  General adult male seated in chair HEENT normocephalic atraumatic extraocular movements intact sclera anicteric Neck supple trachea midline Lungs basilar crackles; increased work of breathing with exertion  Heart S1S2 no rub Abdomen soft nontender nondistended Extremities 2-3+ edema; legs are wrapped and not propped up  Psych normal mood and affect Neuro - awake but sleepy on arrival   Medications reviewed   Labs:  BMP Latest Ref Rng & Units 07/09/2019 07/08/2019 07/07/2019  Glucose 70 - 99 mg/dL 187(H) 77 168(H)  BUN 8 - 23 mg/dL 73(H) 68(H) 69(H)  Creatinine 0.61 - 1.24 mg/dL 4.68(H) 4.62(H) 4.57(H)  Sodium 135 - 145 mmol/L 137 137 137  Potassium 3.5 - 5.1 mmol/L  5.1 5.0 4.8  Chloride 98 - 111 mmol/L 105 108 108  CO2 22 - 32 mmol/L 23 23 24   Calcium 8.9 - 10.3 mg/dL 8.6(L) 8.7(L) 8.8(L)     Assessment/Plan:   # AKI - Note rise in cr appears temporally related to recent Bactrim use- would avoid any bactrim given CKD  - Changed to renal diet carbohydrate modified with fluid restriction - watch K - lasix as below.  hold oral diuretic regimen for now - no acute indication for HD but following closely and pt would want if needed - renal panel in AM - strict ins/outs  # CKD stage IV - Felt secondary to history of diabetes, hypertension, and chronic urinary retention - His baseline creatinine is near 3 - Follows with Dr. Theador Hawthorne  # Acute hypoxic resp failure - Secondary to overload  - Lasix as below  # Acute on  Chronic  diastolic CHF - Lasix 80 mg IV x 2 doses - now and at 3pm - ordered daily weights  # HTN - Continue home regimen for now - Improved with diuresis  # Chronic urinary retention - Foley catheter is in place - continue.  He follows with urology as an outpatient  # Anemia CKD - likely secondary in part to CKD as well as iron deficiency.  Iron is 53 and 16% sat  - Started ESA - aranesp at 25 mcg on 1/12  - Feraheme on 1/13   # UTI - hx of  - Obtain urine culture - in process.   - Has been on bactrim for several days - would not restart  Sleepier today.  Overloaded.  Paged primary team to give a status update  Claudia Desanctis, MD 07/09/2019 8:52 AM   Spoke with primary team re: resp status, sleepiness.  They are getting an ABG and will see him.  Appreciate Dr. Blythe Stanford assistance.  Pt states he makes his medical decisions - not married and no children.  Claudia Desanctis  07/09/2019 9:20 AM

## 2019-07-09 NOTE — Progress Notes (Signed)
Pt still trying to pull of Bi-pap and finally got it off and stated he wasn't wearing it anymore for a bit. MD and RT made aware. Pt is on 6L oxygen with sat's at 95-98%. Educated patient the importance to reapply the mask later possibly after supper. Will continue to monitor.

## 2019-07-09 NOTE — Progress Notes (Signed)
PROGRESS NOTE    Danny [name]  LOV:[id number] DOB: June 29, 1955 DOA: 07/07/2019 PCP: Celene Squibb, MD    Brief Narrative:  64 year old male with a history of diabetes, hypertension, urinary retention with chronic Foley catheter, chronic disease stage IV, admitted to the hospital with complaints of shortness of breath.  He was noted to have decompensated CHF, acute kidney injury on chronic kidney disease stage IV and acute hypoxic respiratory failure.  He is being seen by nephrology for worsening renal function.  Started on IV Lasix.  He became more somnolent and noted to have hypercapnia.  Transferred to stepdown for BiPAP.   Assessment & Plan:   Active Problems:   Essential hypertension   Diabetes mellitus ([facility])   CKD (chronic kidney disease)   Acute renal failure superimposed on stage 3 chronic kidney disease (HCC)   Congestive heart failure (HCC)   Acute on chronic diastolic (congestive) heart failure (HCC)   Urinary retention   Controlled type 2 diabetes mellitus with stage 3 chronic kidney disease, with long-term current use of insulin ([place])   Dyslipidemia associated with type 2 diabetes mellitus (HCC)   Chronic gout due to renal impairment without tophus   Bladder outlet obstruction   AKI (acute kidney injury) ([facility])   Hypoxia   Stage I pressure ulcer of right ankle   Edema of both lower extremities   1. Acute respiratory failure with hypercapnia.  Patient noted to have mild respiratory acidosis with pH of 7.26 and PCO2 of 53.  He is somnolent.  Will transfer to stepdown for BiPAP therapy.  Follow blood gas. 2. Metabolic encephalopathy.  Related to hypercapnia.  He is somewhat somnolent.  This should hopefully improve with BiPAP. 3. Acute kidney injury on chronic kidney disease stage IV.  Felt to be related to Bactrim use.  Nephrology following.  Started on intravenous Lasix.  If does not improve, may need to consider hemodialysis.  Monitor urine output. 4. Acute diastolic  congestive heart failure.  Currently on intravenous Lasix.  Monitor urine output.  He does have evidence of volume overload. 5. Pleural effusions.  Noted on chest x-ray.  Likely related to volume overload from CHF/renal disease. 6. Anemia.  Likely related to chronic kidney disease.  Started on Aranesp.  No signs of bleeding at this time.  Continue to monitor.  Transfuse for hemoglobin less than 7. 7. Diabetes.  Holding oral hypoglycemics.  Continue on sliding scale.  Blood sugar stable. 8. Hypertension.  Blood pressure stable on home dose of metoprolol. 9. Urinary tract infection.  Recently treated with Bactrim.  Urine culture has been repeated.  No urinary symptoms at this time. 10. Chronic urinary retention.  Continue Foley catheter.  This was recently changed in the last few days.   DVT prophylaxis: Heparin Code Status: Full code Family Communication: Discussed with sister at the bedside Disposition Plan: Transfer to stepdown for further management of respiratory failure with BiPAP.   Consultants:   Nephrology  Procedures:     Antimicrobials:       Subjective: Patient says he feels tired today.  Says he has difficulty waking up.  He is aware that he is in the hospital.  He says he is shortness of breath when he lays down flat.  Objective: Vitals:   07/08/19 1949 07/08/19 2204 07/09/19 0233 07/09/19 0553  BP:  (!) 141/66  138/63  Pulse:  63  64  Resp:  20  18  Temp:  (!) 97.3 F (36.3 C)  97.6  F (36.4 C)  TempSrc:  Oral  Oral  SpO2: (!) 87% 97% 93% 94%  Weight:      Height:        Intake/Output Summary (Last 24 hours) at 07/09/2019 1241 Last data filed at 07/09/2019 1200 Gross per 24 hour  Intake 483 ml  Output 2700 ml  Net -2217 ml   Filed Weights   07/07/19 1625  Weight: (!) 145.2 kg    Examination:  General exam: Appears calm and comfortable  Respiratory system: Crackles at bases. Respiratory effort normal. Cardiovascular system: S1 & S2 heard, RRR.  No JVD, murmurs, rubs, gallops or clicks.  Bilateral lower extremities are wrapped in compression dressings with 1-2+ edema bilaterally Gastrointestinal system: Abdomen is nondistended, soft and nontender. No organomegaly or masses felt. Normal bowel sounds heard. Central nervous system: Somnolent, slow to answer no focal neurological deficits. Extremities: Symmetric 5 x 5 power. Skin: No rashes, lesions or ulcers Psychiatry: Somnolent, pleasant    Data Reviewed: I have personally reviewed following labs and imaging studies  CBC: Recent Labs  Lab 07/07/19 1700 07/08/19 0442 07/09/19 0555  WBC 8.0 7.9 8.7  NEUTROABS 6.4  --   --   HGB 7.8* 7.4* 7.3*  HCT 24.7* 24.0* 23.6*  MCV 90.5 91.6 91.8  PLT 188 170 937   Basic Metabolic Panel: Recent Labs  Lab 07/07/19 1700 07/08/19 0442 07/09/19 0555  NA 137 137 137  K 4.8 5.0 5.1  CL 108 108 105  CO2 24 23 23   GLUCOSE 168* 77 187*  BUN 69* 68* 73*  CREATININE 4.57* 4.62* 4.68*  CALCIUM 8.8* 8.7* 8.6*  MG  --   --  2.3  PHOS  --   --  5.2*   GFR: Estimated Creatinine Clearance: 23.3 mL/min (A) (by C-G formula based on SCr of 4.68 mg/dL (H)). Liver Function Tests: Recent Labs  Lab 07/07/19 1700 07/08/19 0442 07/09/19 0555  AST 48* 37  --   ALT 56* 53*  --   ALKPHOS 106 99  --   BILITOT 1.1 1.0  --   PROT 7.2 6.8  --   ALBUMIN 3.2* 3.0* 3.0*   No results for input(s): LIPASE, AMYLASE in the last 168 hours. No results for input(s): AMMONIA in the last 168 hours. Coagulation Profile: No results for input(s): INR, PROTIME in the last 168 hours. Cardiac Enzymes: No results for input(s): CKTOTAL, CKMB, CKMBINDEX, TROPONINI in the last 168 hours. BNP (last 3 results) No results for input(s): PROBNP in the last 8760 hours. HbA1C: Recent Labs    07/07/19 2345  HGBA1C 7.0*   CBG: Recent Labs  Lab 07/08/19 1158 07/08/19 1616 07/08/19 2201 07/09/19 0727 07/09/19 1139  GLUCAP 185* 207* 213* 191* 177*   Lipid  Profile: No results for input(s): CHOL, HDL, LDLCALC, TRIG, CHOLHDL, LDLDIRECT in the last 72 hours. Thyroid Function Tests: No results for input(s): TSH, T4TOTAL, FREET4, T3FREE, THYROIDAB in the last 72 hours. Anemia Panel: Recent Labs    07/08/19 1039  FERRITIN 310  TIBC 327  IRON 53   Sepsis Labs: No results for input(s): PROCALCITON, LATICACIDVEN in the last 168 hours.  Recent Results (from the past 240 hour(s))  Urine culture     Status: Abnormal   Collection Time: 06/30/19  1:45 AM   Specimen: Urine, Catheterized  Result Value Ref Range Status   Specimen Description   Final    URINE, CATHETERIZED Performed at [facility], [address] [place], [place] [address]  Special Requests   Final    Normal Performed at [facility] [address] [place], [place] [address]    Culture >=100,000 COLONIES/mL YEAST (A)  Final   Report Status 07/01/2019 FINAL  Final  SARS CORONAVIRUS 2 (TAT 6-24 HRS) Nasopharyngeal Nasopharyngeal Swab     Status: None   Collection Time: 07/07/19  9:51 PM   Specimen: Nasopharyngeal Swab  Result Value Ref Range Status   SARS Coronavirus 2 NEGATIVE NEGATIVE Final    Comment: (NOTE) SARS-CoV-2 target nucleic acids are NOT DETECTED. The SARS-CoV-2 RNA is generally detectable in upper and lower respiratory specimens during the acute phase of infection. Negative results do not preclude SARS-CoV-2 infection, do not rule out co-infections with other pathogens, and should not be used as the sole basis for treatment or other patient management decisions. Negative results must be combined with clinical observations, patient history, and epidemiological information. The expected result is Negative. Fact Sheet for Patients: SugarRoll.be Fact Sheet for Healthcare Providers: https://www.woods-mathews.com/ This test is not yet approved or cleared by the [place] FDA and  has been authorized for detection  and/or diagnosis of SARS-CoV-2 by FDA under an Emergency Use Authorization (EUA). This EUA will remain  in effect (meaning this test can be used) for the duration of the COVID-19 declaration under Section 56 4(b)(1) of the Act, 21 U.S.C. section 360bbb-3(b)(1), unless the authorization is terminated or revoked sooner. Performed at [facility], [place] [address] [place], [place] [address]   Respiratory Panel by RT PCR (Flu A&B, Covid) - Nasopharyngeal Swab     Status: None   Collection Time: 07/08/19 12:47 AM   Specimen: Nasopharyngeal Swab  Result Value Ref Range Status   SARS Coronavirus 2 by RT PCR NEGATIVE NEGATIVE Final    Comment: (NOTE) SARS-CoV-2 target nucleic acids are NOT DETECTED. The SARS-CoV-2 RNA is generally detectable in upper respiratoy specimens during the acute phase of infection. The lowest concentration of SARS-CoV-2 viral copies this assay can detect is 131 copies/mL. A negative result does not preclude SARS-Cov-2 infection and should not be used as the sole basis for treatment or other patient management decisions. A negative result may occur with  improper specimen collection/handling, submission of specimen other than nasopharyngeal swab, presence of viral mutation(s) within the areas targeted by this assay, and inadequate number of viral copies (<131 copies/mL). A negative result must be combined with clinical observations, patient history, and epidemiological information. The expected result is Negative. Fact Sheet for Patients:  PinkCheek.be Fact Sheet for Healthcare Providers:  GravelBags.it This test is not yet ap proved or cleared by the [place] FDA and  has been authorized for detection and/or diagnosis of SARS-CoV-2 by FDA under an Emergency Use Authorization (EUA). This EUA will remain  in effect (meaning this test can be used) for the duration of the COVID-19 declaration under  Section 564(b)(1) of the Act, 21 U.S.C. section 360bbb-3(b)(1), unless the authorization is terminated or revoked sooner.    Influenza A by PCR NEGATIVE NEGATIVE Final   Influenza B by PCR NEGATIVE NEGATIVE Final    Comment: (NOTE) The Xpert Xpress SARS-CoV-2/FLU/RSV assay is intended as an aid in  the diagnosis of influenza from Nasopharyngeal swab specimens and  should not be used as a sole basis for treatment. Nasal washings and  aspirates are unacceptable for Xpert Xpress SARS-CoV-2/FLU/RSV  testing. Fact Sheet for Patients: PinkCheek.be Fact Sheet for Healthcare Providers: GravelBags.it This test is not yet approved or cleared by the  [place] Architectural technologist and  has been authorized for detection and/or diagnosis of SARS-CoV-2 by  FDA under an Print production planner (EUA). This EUA will remain  in effect (meaning this test can be used) for the duration of the  Covid-19 declaration under Section 564(b)(1) of the Act, 21  U.S.C. section 360bbb-3(b)(1), unless the authorization is  terminated or revoked. Performed at [facility], [address] [place], [name] [address]          Radiology Studies: US RENAL  Result Date: 07/08/2019 CLINICAL DATA:  Acute kidney injury. EXAM: RENAL / URINARY TRACT ULTRASOUND COMPLETE COMPARISON:  Renal ultrasound 02/24/2019 FINDINGS: Right Kidney: Renal measurements: 10.9 x 5.4 x 5.8 cm = volume: 182 mL. No hydronephrosis. Mildly increased renal cortical echogenicity. Redemonstrated exophytic simple appearing interpolar cyst measuring 2.4 x 2.4 x 2.5 cm. Left Kidney: Renal measurements: 11.8 x 6.1 x 4.9 cm = volume: 184 mL. No hydronephrosis. Mildly increased renal cortical echogenicity. Bladder: The bladder is collapsed around a Foley catheter. Other: Small volume pelvic ascites. IMPRESSION: Increased renal cortical echogenicity bilaterally, suggestive of chronic renal parenchymal disease. 2.5  cm simple appearing right renal cyst. No hydronephrosis. Bladder collapsed around Foley catheter. Small volume pelvic ascites. Electronically Signed   By: Kellie Simmering DO   On: 07/08/2019 09:25   DG CHEST PORT 1 VIEW  Result Date: 07/09/2019 CLINICAL DATA:  Dyspnea EXAM: PORTABLE CHEST 1 VIEW COMPARISON:  07/07/2019 FINDINGS: Symmetric haziness of the lower chest attributed to pleural fluid and presumed atelectasis. Cardiomegaly. No pneumothorax IMPRESSION: 1. Low volume chest with pleural effusions and atelectasis. 2. Chronic cardiomegaly. Electronically Signed   By: Monte Fantasia M.D.   On: 07/09/2019 06:59   DG Chest Portable 1 View  Result Date: 07/07/2019 CLINICAL DATA:  Shortness of breath since last night, nonproductive cough, history CHF, hypertension, diabetes mellitus EXAM: PORTABLE CHEST 1 VIEW COMPARISON:  Portable exam 1702 hours compared to 06/26/2019 FINDINGS: Enlargement of cardiac silhouette. Mediastinal contours and pulmonary vascularity normal. Minimal RIGHT pleural effusion and bibasilar atelectasis. Remaining lungs clear. No pneumothorax or acute osseous findings. IMPRESSION: Tiny RIGHT pleural effusion and mild bibasilar atelectasis. Electronically Signed   By: Lavonia Dana M.D.   On: 07/07/2019 17:20        Scheduled Meds: . allopurinol  300 mg Oral Daily  . amLODipine  10 mg Oral QHS  . Chlorhexidine Gluconate Cloth  6 each Topical Daily  . folic acid  1 mg Oral Daily  . furosemide  80 mg Intravenous Once  . guaiFENesin  600 mg Oral BID  . heparin  5,000 Units Subcutaneous Q8H  . insulin aspart  0-9 Units Subcutaneous TID WC  . insulin aspart protamine- aspart  4 Units Subcutaneous BID WC  . metoprolol succinate  100 mg Oral QPM  . mirabegron ER  25 mg Oral Daily  . pravastatin  80 mg Oral QPM  . sodium chloride flush  3 mL Intravenous Q12H  . tamsulosin  0.4 mg Oral QPC supper   Continuous Infusions: . sodium chloride       LOS: 1 day   Critical care  procedure note Authorized and performed by: Kathie Dike Total critical care time: Approximately 40 minutes Due to high probability of clinically significant, life-threatening deterioration, the patient required my highest level of preparedness to intervene emergently and I personally spent this critical care time directly and personally managing the patient.  The critical care time included obtaining a history, examining the patient, pulse oximetry,  ordering and review of studies, arranging urgent treatment with development of a management plan, evaluation of patient's response to treatment, frequent reassessment, discussions with other providers.  Critical care time was performed to assess and manage the high probability of imminent, life-threatening deterioration that could result in multiorgan failure.  It was exclusive of separate billable procedures and treating other patients and teaching time.  Please see MDM section and the rest of the of note for further information on patient assessment and treatment     Kathie Dike, MD [facility]   If 7PM-7AM, please contact night-coverage [email or web address]  07/09/2019, 12:41 PM

## 2019-07-09 NOTE — Progress Notes (Signed)
Patient is on BIPAP 10/5 40% FIO2; however, patient has pulled off several times stating that ' he cant wear it'. RT placed him back on and explained the importance of usage. RN made aware, RT will continue to monitor.

## 2019-07-10 LAB — RENAL FUNCTION PANEL
Albumin: 3.1 g/dL — ABNORMAL LOW (ref 3.5–5.0)
Anion gap: 9 (ref 5–15)
BUN: 75 mg/dL — ABNORMAL HIGH (ref 8–23)
CO2: 23 mmol/L (ref 22–32)
Calcium: 8.4 mg/dL — ABNORMAL LOW (ref 8.9–10.3)
Chloride: 104 mmol/L (ref 98–111)
Creatinine, Ser: 4.6 mg/dL — ABNORMAL HIGH (ref 0.61–1.24)
GFR calc Af Amer: 14 mL/min — ABNORMAL LOW (ref >60)
GFR calc non Af Amer: 12 mL/min — ABNORMAL LOW (ref >60)
Glucose, Bld: 161 mg/dL — ABNORMAL HIGH (ref 70–99)
Phosphorus: 4.9 mg/dL — ABNORMAL HIGH (ref 2.5–4.6)
Potassium: 4.5 mmol/L (ref 3.5–5.1)
Sodium: 136 mmol/L (ref 135–145)

## 2019-07-10 LAB — CBC
HCT: 23.6 % — ABNORMAL LOW (ref 39.0–52.0)
Hemoglobin: 7.4 g/dL — ABNORMAL LOW (ref 13.0–17.0)
MCH: 28.6 pg (ref 26.0–34.0)
MCHC: 31.4 g/dL (ref 30.0–36.0)
MCV: 91.1 fL (ref 80.0–100.0)
Platelets: 163 10*3/uL (ref 150–400)
RBC: 2.59 MIL/uL — ABNORMAL LOW (ref 4.22–5.81)
RDW: 18.4 % — ABNORMAL HIGH (ref 11.5–15.5)
WBC: 8.8 10*3/uL (ref 4.0–10.5)
nRBC: 0.2 % (ref 0.0–0.2)

## 2019-07-10 LAB — URINE CULTURE: Culture: >=100000 — AB

## 2019-07-10 LAB — GLUCOSE, CAPILLARY
Glucose-Capillary: 151 mg/dL — ABNORMAL HIGH (ref 70–99)
Glucose-Capillary: 140 mg/dL — ABNORMAL HIGH (ref 70–99)
Glucose-Capillary: 157 mg/dL — ABNORMAL HIGH (ref 70–99)
Glucose-Capillary: 139 mg/dL — ABNORMAL HIGH (ref 70–99)
Glucose-Capillary: 161 mg/dL — ABNORMAL HIGH (ref 70–99)

## 2019-07-10 LAB — BLOOD GAS, ARTERIAL
Acid-base deficit: 1.6 mmol/L (ref 0.0–2.0)
Bicarbonate: 22.8 mmol/L (ref 20.0–28.0)
Drawn by: 27407
FIO2: 38
O2 Saturation: 91.9 %
Patient temperature: 37
pCO2 arterial: 51.7 mmHg — ABNORMAL HIGH (ref 32.0–48.0)
pH, Arterial: 7.29 — ABNORMAL LOW (ref 7.350–7.450)
pO2, Arterial: 66.6 mmHg — ABNORMAL LOW (ref 83.0–108.0)

## 2019-07-10 MED ORDER — FUROSEMIDE 10 MG/ML IJ SOLN
80.0000 mg | Freq: Three times a day (TID) | INTRAMUSCULAR | Status: DC
  Administered 2019-07-10 – 2019-07-15 (×16): 80 mg via INTRAVENOUS
  Filled 2019-07-10 (×17): qty 8

## 2019-07-10 NOTE — Progress Notes (Signed)
Admit: 07/07/2019 LOS: 2  35M DM2, chornic foley catheter with sCHF exacerbation, hypervolemia  Subjective:  . 3.5 UOP with lasix, down 5kg . Stable / slightly ijmproved SCr . K 4.5 . SOB improved . Remains edematous   01/13 0701 - 01/14 0700 In: 3 [I.V.:3] Out: 3500 [Urine:3500]  Filed Weights   07/07/19 1625 07/09/19 1307 07/10/19 0500  Weight: (!) 145.2 kg (!) 140.6 kg (!) 140.6 kg    Scheduled Meds: . allopurinol  300 mg Oral Daily  . amLODipine  10 mg Oral QHS  . Chlorhexidine Gluconate Cloth  6 each Topical Daily  . folic acid  1 mg Oral Daily  . furosemide  80 mg Intravenous TID  . guaiFENesin  600 mg Oral BID  . heparin  5,000 Units Subcutaneous Q8H  . insulin aspart  0-9 Units Subcutaneous TID WC  . insulin aspart protamine- aspart  4 Units Subcutaneous BID WC  . metoprolol succinate  100 mg Oral QPM  . mirabegron ER  25 mg Oral Daily  . pravastatin  80 mg Oral QPM  . sodium chloride flush  3 mL Intravenous Q12H  . tamsulosin  0.4 mg Oral QPC supper   Continuous Infusions: . sodium chloride     PRN Meds:.sodium chloride, acetaminophen **OR** acetaminophen, albuterol, ondansetron **OR** ondansetron (ZOFRAN) IV, sodium chloride flush  Current Labs: reviewed    Physical Exam:  Blood pressure 139/66, pulse 71, temperature 97.9 F (36.6 C), temperature source Oral, resp. rate 18, height 6' (1.829 m), weight (!) 140.6 kg, SpO2 96 %. NAD, chornically ill appearing,  Obese 3+ LEE, pitting Some abd wall edema Foley in place Regular, nl s1s2 no rub Nl mood/affect  A 1. AoCKD4, BL SCr 3, follows with [facility]. Acute decomp likely from TMP/SMX and #2 2. AoC dCHF exacerbation, diuresing well 3. AMS, hyperapnea, stable 4. Chornic urinar retention with foley catheter 5. HTN 6. DM2 7. Anemia, s/p fereheme 1/13 and started ESA 1/12, stable CTM  P . Lasix 80 IV TID today . Daily weights, Daily Renal Panel, Strict I/Os, Avoid nephrotoxins (NSAIDs, judicious IV  Contrast)  . Medication Issues; o Preferred narcotic agents for pain control are hydromorphone, fentanyl, and methadone. Morphine should not be used.  o Baclofen should be avoided o Avoid oral sodium phosphate and magnesium citrate based laxatives / bowel preps    Pearson Grippe MD 07/10/2019, 9:39 AM  Recent Labs  Lab 07/08/19 0442 07/09/19 0555 07/10/19 0444  NA 137 137 136  K 5.0 5.1 4.5  CL 108 105 104  CO2 23 23 23   GLUCOSE 77 187* 161*  BUN 68* 73* 75*  CREATININE 4.62* 4.68* 4.60*  CALCIUM 8.7* 8.6* 8.4*  PHOS  --  5.2* 4.9*   Recent Labs  Lab 07/07/19 1700 07/07/19 1700 07/08/19 0442 07/09/19 0555 07/10/19 0444  WBC 8.0   < > 7.9 8.7 8.8  NEUTROABS 6.4  --   --   --   --   HGB 7.8*   < > 7.4* 7.3* 7.4*  HCT 24.7*   < > 24.0* 23.6* 23.6*  MCV 90.5   < > 91.6 91.8 91.1  PLT 188   < > 170 169 163   < > = values in this interval not displayed.

## 2019-07-10 NOTE — Progress Notes (Signed)
PROGRESS NOTE    Danny [name]  DVV:[id number] DOB: 30-Mar-1956 DOA: 07/07/2019 PCP: Celene Squibb, MD   Brief Narrative:  64 year old male with a history of diabetes, hypertension, urinary retention with chronic Foley catheter, chronic disease stage IV, admitted to the hospital with complaints of shortness of breath.  He was noted to have decompensated CHF, acute kidney injury on chronic kidney disease stage IV and acute hypoxic respiratory failure.  He is being seen by nephrology for worsening renal function.  Started on IV Lasix.  He became more somnolent and noted to have hypercapnia.  Transferred to stepdown for BiPAP.  1/14: Patient continues to remain somewhat lethargic and somnolent this morning.  ABG does demonstrate some improvement and he is diuresing well with approximately 3.5 L urine output and decrease in weight.  Shortness of breath appears improved.  Assessment & Plan:   Active Problems:   Essential hypertension   Diabetes mellitus ([address] [address])   CKD (chronic kidney disease)   Acute renal failure superimposed on stage 3 chronic kidney disease (HCC)   Congestive heart failure (HCC)   Acute on chronic diastolic (congestive) heart failure (HCC)   Urinary retention   Controlled type 2 diabetes mellitus with stage 3 chronic kidney disease, with long-term current use of insulin ([place])   Dyslipidemia associated with type 2 diabetes mellitus (HCC)   Chronic gout due to renal impairment without tophus   Bladder outlet obstruction   AKI (acute kidney injury) ([place])   Hypoxia   Stage I pressure ulcer of right ankle   Edema of both lower extremities   1. Acute respiratory failure with hypercapnia-improving.  Recheck ABG in a.m. and remain off BiPAP for now.  Wean oxygen as tolerated. 2. Hypercapnic encephalopathy.  Continues to remain somewhat somnolent.  Monitor in stepdown unit for now until tomorrow. 3. Acute kidney injury on chronic kidney disease stage IV.  Felt to be related to Bactrim  use as well as acute diastolic congestive heart failure. Nephrology following.  Started on intravenous Lasix, now 3 times daily.  If does not improve, may need to consider hemodialysis.  Monitor ongoing urine output. 4. Acute diastolic congestive heart failure-improving.  Currently on intravenous Lasix.  Monitor urine output.  He does have evidence of volume overload. 5. Pleural effusions.  Noted on chest x-ray.  Likely related to volume overload from CHF/renal disease. 6. Anemia.  Likely related to chronic kidney disease.  Started on Aranesp.  No signs of bleeding at this time.  Continue to monitor.  Transfuse for hemoglobin less than 7.  Currently stable. 7. Diabetes.  Holding oral hypoglycemics.  Continue on sliding scale.  Blood sugar stable. 8. Hypertension.  Blood pressure stable on home dose of metoprolol. 9. Urinary tract infection.  Recently treated with Bactrim.  Urine culture has been repeated.  No urinary symptoms at this time. 10. Chronic urinary retention.  Continue Foley catheter.  This was recently changed in the last few days.   DVT prophylaxis: Heparin Code Status: Full Family Communication: We will plan to call sister Disposition Plan: Continue stepdown unit monitoring as patient is still quite lethargic.  Follow ABG in a.m.  Continue diuresis per nephrology.   Consultants:   Nephrology  Procedures:   None  Antimicrobials:   None   Subjective: Patient seen and evaluated today with no new acute complaints or concerns. No acute concerns or events noted overnight.  He remains somewhat somnolent this morning, but has had good diuresis overnight with improvement in his  ABG this morning.  Objective: Vitals:   07/10/19 0400 07/10/19 0500 07/10/19 0922 07/10/19 1000  BP: 139/66  (!) 142/67 135/62  Pulse: 71  67 66  Resp: 18  17   Temp: 97.9 F (36.6 C)     TempSrc: Oral     SpO2: 96%  97% 96%  Weight:  (!) 140.6 kg    Height:        Intake/Output Summary (Last  24 hours) at 07/10/2019 1216 Last data filed at 07/10/2019 7616 Gross per 24 hour  Intake 3 ml  Output 2800 ml  Net -2797 ml   Filed Weights   07/07/19 1625 07/09/19 1307 07/10/19 0500  Weight: (!) 145.2 kg (!) 140.6 kg (!) 140.6 kg    Examination:  General exam: Appears calm and comfortable, somnolent Respiratory system: Clear to auscultation. Respiratory effort normal.  Currently on 4-5 L nasal cannula. Cardiovascular system: S1 & S2 heard, RRR. No JVD, murmurs, rubs, gallops or clicks. No pedal edema. Gastrointestinal system: Abdomen is nondistended, soft and nontender. No organomegaly or masses felt. Normal bowel sounds heard. Central nervous system: Somnolent but arousable Extremities: Ongoing edema noted in bilateral extremities Skin: No rashes, lesions or ulcers Psychiatry: Difficult to assess.    Data Reviewed: I have personally reviewed following labs and imaging studies  CBC: Recent Labs  Lab 07/07/19 1700 07/08/19 0442 07/09/19 0555 07/10/19 0444  WBC 8.0 7.9 8.7 8.8  NEUTROABS 6.4  --   --   --   HGB 7.8* 7.4* 7.3* 7.4*  HCT 24.7* 24.0* 23.6* 23.6*  MCV 90.5 91.6 91.8 91.1  PLT 188 170 169 073   Basic Metabolic Panel: Recent Labs  Lab 07/07/19 1700 07/08/19 0442 07/09/19 0555 07/10/19 0444  NA 137 137 137 136  K 4.8 5.0 5.1 4.5  CL 108 108 105 104  CO2 24 23 23 23   GLUCOSE 168* 77 187* 161*  BUN 69* 68* 73* 75*  CREATININE 4.57* 4.62* 4.68* 4.60*  CALCIUM 8.8* 8.7* 8.6* 8.4*  MG  --   --  2.3  --   PHOS  --   --  5.2* 4.9*   GFR: Estimated Creatinine Clearance: 23.3 mL/min (A) (by C-G formula based on SCr of 4.6 mg/dL (H)). Liver Function Tests: Recent Labs  Lab 07/07/19 1700 07/08/19 0442 07/09/19 0555 07/10/19 0444  AST 48* 37  --   --   ALT 56* 53*  --   --   ALKPHOS 106 99  --   --   BILITOT 1.1 1.0  --   --   PROT 7.2 6.8  --   --   ALBUMIN 3.2* 3.0* 3.0* 3.1*   No results for input(s): LIPASE, AMYLASE in the last 168  hours. No results for input(s): AMMONIA in the last 168 hours. Coagulation Profile: No results for input(s): INR, PROTIME in the last 168 hours. Cardiac Enzymes: No results for input(s): CKTOTAL, CKMB, CKMBINDEX, TROPONINI in the last 168 hours. BNP (last 3 results) No results for input(s): PROBNP in the last 8760 hours. HbA1C: Recent Labs    07/07/19 2345  HGBA1C 7.0*   CBG: Recent Labs  Lab 07/09/19 1139 07/09/19 1611 07/09/19 2047 07/10/19 0751 07/10/19 1143  GLUCAP 177* 174* 151* 140* 157*   Lipid Profile: No results for input(s): CHOL, HDL, LDLCALC, TRIG, CHOLHDL, LDLDIRECT in the last 72 hours. Thyroid Function Tests: No results for input(s): TSH, T4TOTAL, FREET4, T3FREE, THYROIDAB in the last 72 hours. Anemia Panel: Recent Labs  07/08/19 1039  FERRITIN 310  TIBC 327  IRON 53   Sepsis Labs: No results for input(s): PROCALCITON, LATICACIDVEN in the last 168 hours.  Recent Results (from the past 240 hour(s))  Culture, Urine     Status: Abnormal   Collection Time: 07/07/19  5:14 PM   Specimen: Urine, Clean Catch  Result Value Ref Range Status   Specimen Description   Final    URINE, CLEAN CATCH Performed at [facility], [address], [place], [place] [address]    Special Requests   Final    NONE Performed at [facility], [address] [place], [place] [address]    Culture (A)  Final    >=100,000 COLONIES/mL ENTEROCOCCUS FAECALIS >=100,000 COLONIES/mL YEAST    Report Status 07/10/2019 FINAL  Final   Organism ID, Bacteria ENTEROCOCCUS FAECALIS (A)  Final      Susceptibility   Enterococcus faecalis - MIC*    AMPICILLIN <=2 SENSITIVE Sensitive     NITROFURANTOIN <=16 SENSITIVE Sensitive     VANCOMYCIN 1 SENSITIVE Sensitive     * >=100,000 COLONIES/mL ENTEROCOCCUS FAECALIS  SARS CORONAVIRUS 2 (TAT 6-24 HRS) Nasopharyngeal Nasopharyngeal Swab     Status: None   Collection Time: 07/07/19  9:51 PM   Specimen: Nasopharyngeal Swab  Result Value Ref  Range Status   SARS Coronavirus 2 NEGATIVE NEGATIVE Final    Comment: (NOTE) SARS-CoV-2 target nucleic acids are NOT DETECTED. The SARS-CoV-2 RNA is generally detectable in upper and lower respiratory specimens during the acute phase of infection. Negative results do not preclude SARS-CoV-2 infection, do not rule out co-infections with other pathogens, and should not be used as the sole basis for treatment or other patient management decisions. Negative results must be combined with clinical observations, patient history, and epidemiological information. The expected result is Negative. Fact Sheet for Patients: SugarRoll.be Fact Sheet for Healthcare Providers: https://www.woods-mathews.com/ This test is not yet approved or cleared by the [place] FDA and  has been authorized for detection and/or diagnosis of SARS-CoV-2 by FDA under an Emergency Use Authorization (EUA). This EUA will remain  in effect (meaning this test can be used) for the duration of the COVID-19 declaration under Section 56 4(b)(1) of the Act, 21 U.S.C. section 360bbb-3(b)(1), unless the authorization is terminated or revoked sooner. Performed at [facility], [place] [address], [place],  [address]   Respiratory Panel by RT PCR (Flu A&B, Covid) - Nasopharyngeal Swab     Status: None   Collection Time: 07/08/19 12:47 AM   Specimen: Nasopharyngeal Swab  Result Value Ref Range Status   SARS Coronavirus 2 by RT PCR NEGATIVE NEGATIVE Final    Comment: (NOTE) SARS-CoV-2 target nucleic acids are NOT DETECTED. The SARS-CoV-2 RNA is generally detectable in upper respiratoy specimens during the acute phase of infection. The lowest concentration of SARS-CoV-2 viral copies this assay can detect is 131 copies/mL. A negative result does not preclude SARS-Cov-2 infection and should not be used as the sole basis for treatment or other patient management decisions. A  negative result may occur with  improper specimen collection/handling, submission of specimen other than nasopharyngeal swab, presence of viral mutation(s) within the areas targeted by this assay, and inadequate number of viral copies (<131 copies/mL). A negative result must be combined with clinical observations, patient history, and epidemiological information. The expected result is Negative. Fact Sheet for Patients:  PinkCheek.be Fact Sheet for Healthcare Providers:  GravelBags.it This test is not yet ap proved or cleared  by the [place] and  has been authorized for detection and/or diagnosis of SARS-CoV-2 by FDA under an Emergency Use Authorization (EUA). This EUA will remain  in effect (meaning this test can be used) for the duration of the COVID-19 declaration under Section 564(b)(1) of the Act, 21 U.S.C. section 360bbb-3(b)(1), unless the authorization is terminated or revoked sooner.    Influenza A by PCR NEGATIVE NEGATIVE Final   Influenza B by PCR NEGATIVE NEGATIVE Final    Comment: (NOTE) The Xpert Xpress SARS-CoV-2/FLU/RSV assay is intended as an aid in  the diagnosis of influenza from Nasopharyngeal swab specimens and  should not be used as a sole basis for treatment. Nasal washings and  aspirates are unacceptable for Xpert Xpress SARS-CoV-2/FLU/RSV  testing. Fact Sheet for Patients: PinkCheek.be Fact Sheet for Healthcare Providers: GravelBags.it This test is not yet approved or cleared by the [place] FDA and  has been authorized for detection and/or diagnosis of SARS-CoV-2 by  FDA under an Emergency Use Authorization (EUA). This EUA will remain  in effect (meaning this test can be used) for the duration of the  Covid-19 declaration under Section 564(b)(1) of the Act, 21  U.S.C. section 360bbb-3(b)(1), unless the authorization is   terminated or revoked. Performed at [facility], [address], [place], [place] [address]   MRSA PCR Screening     Status: None   Collection Time: 07/09/19 12:15 PM   Specimen: Nasal Mucosa; Nasopharyngeal  Result Value Ref Range Status   MRSA by PCR NEGATIVE NEGATIVE Final    Comment:        The GeneXpert MRSA Assay (FDA approved for NASAL specimens only), is one component of a comprehensive MRSA colonization surveillance program. It is not intended to diagnose MRSA infection nor to guide or monitor treatment for MRSA infections. Performed at [facility], [address], [place], [place] [address]          Radiology Studies: DG CHEST PORT 1 VIEW  Result Date: 07/09/2019 CLINICAL DATA:  Dyspnea EXAM: PORTABLE CHEST 1 VIEW COMPARISON:  07/07/2019 FINDINGS: Symmetric haziness of the lower chest attributed to pleural fluid and presumed atelectasis. Cardiomegaly. No pneumothorax IMPRESSION: 1. Low volume chest with pleural effusions and atelectasis. 2. Chronic cardiomegaly. Electronically Signed   By: Monte Fantasia M.D.   On: 07/09/2019 06:59        Scheduled Meds: . allopurinol  300 mg Oral Daily  . amLODipine  10 mg Oral QHS  . Chlorhexidine Gluconate Cloth  6 each Topical Daily  . folic acid  1 mg Oral Daily  . furosemide  80 mg Intravenous TID  . guaiFENesin  600 mg Oral BID  . heparin  5,000 Units Subcutaneous Q8H  . insulin aspart  0-9 Units Subcutaneous TID WC  . insulin aspart protamine- aspart  4 Units Subcutaneous BID WC  . metoprolol succinate  100 mg Oral QPM  . mirabegron ER  25 mg Oral Daily  . pravastatin  80 mg Oral QPM  . sodium chloride flush  3 mL Intravenous Q12H  . tamsulosin  0.4 mg Oral QPC supper   Continuous Infusions: . sodium chloride       LOS: 2 days    Time spent: 30 minutes    Acie Custis Darleen Crocker, DO [facility] Pager [phone]  If 7PM-7AM, please contact night-coverage [email or web address] Password [facility] 07/10/2019,  12:16 PM

## 2019-07-10 NOTE — Progress Notes (Signed)
Patient refused to wear BIPAP. Patient was not even willing to try and wear it. Will draw ABG in the morning.

## 2019-07-11 LAB — GLUCOSE, CAPILLARY
Glucose-Capillary: 117 mg/dL — ABNORMAL HIGH (ref 70–99)
Glucose-Capillary: 145 mg/dL — ABNORMAL HIGH (ref 70–99)
Glucose-Capillary: 175 mg/dL — ABNORMAL HIGH (ref 70–99)
Glucose-Capillary: 170 mg/dL — ABNORMAL HIGH (ref 70–99)

## 2019-07-11 LAB — BLOOD GAS, ARTERIAL
Acid-Base Excess: 1.7 mmol/L (ref 0.0–2.0)
Bicarbonate: 25.7 mmol/L (ref 20.0–28.0)
Drawn by: 38235
FIO2: 36
O2 Saturation: 93.1 %
Patient temperature: 37
pCO2 arterial: 49.6 mmHg — ABNORMAL HIGH (ref 32.0–48.0)
pH, Arterial: 7.351 (ref 7.350–7.450)
pO2, Arterial: 65.5 mmHg — ABNORMAL LOW (ref 83.0–108.0)

## 2019-07-11 LAB — CBC
HCT: 22.7 % — ABNORMAL LOW (ref 39.0–52.0)
Hemoglobin: 7.1 g/dL — ABNORMAL LOW (ref 13.0–17.0)
MCH: 28.5 pg (ref 26.0–34.0)
MCHC: 31.3 g/dL (ref 30.0–36.0)
MCV: 91.2 fL (ref 80.0–100.0)
Platelets: 163 10*3/uL (ref 150–400)
RBC: 2.49 MIL/uL — ABNORMAL LOW (ref 4.22–5.81)
RDW: 18.3 % — ABNORMAL HIGH (ref 11.5–15.5)
WBC: 7.9 10*3/uL (ref 4.0–10.5)
nRBC: 0 % (ref 0.0–0.2)

## 2019-07-11 LAB — BASIC METABOLIC PANEL
Anion gap: 9 (ref 5–15)
BUN: 76 mg/dL — ABNORMAL HIGH (ref 8–23)
CO2: 26 mmol/L (ref 22–32)
Calcium: 8.4 mg/dL — ABNORMAL LOW (ref 8.9–10.3)
Chloride: 101 mmol/L (ref 98–111)
Creatinine, Ser: 4.22 mg/dL — ABNORMAL HIGH (ref 0.61–1.24)
GFR calc Af Amer: 16 mL/min — ABNORMAL LOW (ref >60)
GFR calc non Af Amer: 14 mL/min — ABNORMAL LOW (ref >60)
Glucose, Bld: 164 mg/dL — ABNORMAL HIGH (ref 70–99)
Potassium: 4.3 mmol/L (ref 3.5–5.1)
Sodium: 136 mmol/L (ref 135–145)

## 2019-07-11 LAB — MAGNESIUM: Magnesium: 2.2 mg/dL (ref 1.7–2.4)

## 2019-07-11 MED ORDER — NITROFURANTOIN MONOHYD MACRO 100 MG PO CAPS: 100.0000 mg | ORAL_CAPSULE | Freq: Two times a day (BID) | ORAL | Status: DC

## 2019-07-11 MED ORDER — AMOXICILLIN 250 MG PO CAPS
500.0000 mg | ORAL_CAPSULE | Freq: Two times a day (BID) | ORAL | Status: DC
  Administered 2019-07-11 – 2019-07-17 (×13): 500 mg via ORAL
  Filled 2019-07-11 (×13): qty 2

## 2019-07-11 NOTE — Evaluation (Signed)
Physical Therapy Evaluation Patient Details Name: Danny [name] MRN: [id number] DOB: 07-11-1955 Today's Date: 07/11/2019   History of Present Illness  Danny [name]  is a 64 y.o. male, with history of diabetes mellitus type 2, hypertension, urinary retention with chronic Foley catheter in place, CKD stage IV, chronic ulcers on foot and ankle came to hospital with complaints of shortness of breath for past 2 days.  Patient said that he has been coughing up phlegm with color ranging from white to brown.  He has history of diastolic CHF and is on torsemide at home.    Clinical Impression  Patient demonstrates slow labored movement during functional mobility, has to lean over RW secondary to weakness, limited to a few steps at bedside with SpO2 dropping from 94% to 80% on room air with exertion, had to take frequent rest breaks due to SOB and tolerated sitting up in chair after therapy.  Patient will benefit from continued physical therapy in hospital and recommended venue below to increase strength, balance, endurance for safe ADLs and gait.    Follow Up Recommendations SNF;Supervision - Intermittent    Equipment Recommendations  None recommended by PT    Recommendations for Other Services       Precautions / Restrictions Precautions Precautions: Fall Restrictions Weight Bearing Restrictions: No      Mobility  Bed Mobility Overal bed mobility: Needs Assistance Bed Mobility: Supine to Sit;Sit to Supine     Supine to sit: Min guard Sit to supine: Min guard   General bed mobility comments: labored slow movement with frequent rest breaks and O2 desaturation on exertion  Transfers Overall transfer level: Needs assistance Equipment used: Rolling walker (2 wheeled) Transfers: Sit to/from [facility] Sit to Stand: Min assist Stand pivot transfers: Min assist       General transfer comment: slow labored movement  Ambulation/Gait Ambulation/Gait assistance: Min  assist;Mod assist Gait Distance (Feet): 5 Feet Assistive device: Rolling walker (2 wheeled) Gait Pattern/deviations: Decreased step length - right;Decreased step length - left;Decreased stride length Gait velocity: slow   General Gait Details: limited to 5-6 slow labored steps at bedside, has to lean over RW, unable to extend trunk due to weakness, on room air with SpO2 dropping from 94% to 80% on exertion  Stairs            Wheelchair Mobility    Modified Rankin (Stroke Patients Only)       Balance Overall balance assessment: Needs assistance Sitting-balance support: Feet supported;No upper extremity supported Sitting balance-Leahy Scale: Good Sitting balance - Comments: seated at EOB   Standing balance support: During functional activity;Bilateral upper extremity supported Standing balance-Leahy Scale: Fair Standing balance comment: using RW                             Pertinent Vitals/Pain Pain Assessment: No/denies pain    Home Living Family/patient expects to be discharged to:: Private residence Living Arrangements: Alone Available Help at Discharge: Family;Available PRN/intermittently Type of Home: Apartment Home Access: Stairs to enter Entrance Stairs-Rails: None Entrance Stairs-Number of Steps: 1 Home Layout: One level Home Equipment: Walker - 4 wheels;Shower seat;Bedside commode;Walker - 2 wheels      Prior Function Level of Independence: Independent with assistive device(s)         Comments: household and short distanced Chiropractor Dominance        Extremity/Trunk Assessment   Upper Extremity  Assessment Upper Extremity Assessment: Generalized weakness    Lower Extremity Assessment Lower Extremity Assessment: Generalized weakness    Cervical / Trunk Assessment Cervical / Trunk Assessment: Kyphotic  Communication   Communication: No difficulties  Cognition Arousal/Alertness:  Awake/alert Behavior During Therapy: WFL for tasks assessed/performed Overall Cognitive Status: Within Functional Limits for tasks assessed                                        General Comments      Exercises     Assessment/Plan    PT Assessment Patient needs continued PT services  PT Problem List Decreased strength;Decreased activity tolerance;Decreased balance;Decreased mobility       PT Treatment Interventions Gait training;Functional mobility training;Therapeutic activities;Therapeutic exercise;Stair training;Patient/family education;Balance training    PT Goals (Current goals can be found in the Care Plan section)  Acute Rehab PT Goals Patient Stated Goal: return home after rehab PT Goal Formulation: With patient Time For Goal Achievement: 07/25/19 Potential to Achieve Goals: Good    Frequency Min 3X/week   Barriers to discharge        Co-evaluation               AM-PAC PT "6 Clicks" Mobility  Outcome Measure Help needed turning from your back to your side while in a flat bed without using bedrails?: A Little Help needed moving from lying on your back to sitting on the side of a flat bed without using bedrails?: A Little Help needed moving to and from a bed to a chair (including a wheelchair)?: A Little Help needed standing up from a chair using your arms (e.g., wheelchair or bedside chair)?: A Little Help needed to walk in hospital room?: A Lot Help needed climbing 3-5 steps with a railing? : A Lot 6 Click Score: 16    End of Session Equipment Utilized During Treatment: Oxygen Activity Tolerance: Patient tolerated treatment well;Patient limited by fatigue Patient left: in chair;with call bell/phone within reach Nurse Communication: Mobility status PT Visit Diagnosis: Unsteadiness on feet (R26.81);Other abnormalities of gait and mobility (R26.89);Muscle weakness (generalized) (M62.81)    Time: 8502-7741 PT Time Calculation (min)  (ACUTE ONLY): 26 min   Charges:   PT Evaluation $PT Eval Moderate Complexity: 1 Mod PT Treatments $Therapeutic Activity: 23-37 mins        2:11 PM, 07/11/19 Lonell Grandchild, MPT Physical Therapist with [facility] [phone] office [phone] mobile phone

## 2019-07-11 NOTE — Care Management Important Message (Signed)
Important Message  Patient Details  Name: Danny [name] MRN: [id number] Date of Birth: 1956-04-25   Medicare Important Message Given:  Yes     [name] 07/11/2019, 4:14 PM

## 2019-07-11 NOTE — Progress Notes (Signed)
Admit: 07/07/2019 LOS: 3  40M DM2, chornic foley catheter with sCHF exacerbation, hypervolemia  Subjective:  . Creatinine further improved to 4.2, K4.3, HCO3 26 . 5.4 L urine output, 6 kg down on weight . Now on 3 L nasal cannula, in chair . No complaints or concerns . Urine culture with greater than 100,000 CFU's of pansensitive enterococcal faecalis . Still has significant edema, legs wrapped, into the proximal legs  01/14 0701 - 01/15 0700 In: 403 [P.O.:400; I.V.:3] Out: 5400 [Urine:5400]  Filed Weights   07/09/19 1307 07/10/19 0500 07/11/19 0500  Weight: (!) 140.6 kg (!) 140.6 kg 134 kg    Scheduled Meds: . allopurinol  300 mg Oral Daily  . amLODipine  10 mg Oral QHS  . Chlorhexidine Gluconate Cloth  6 each Topical Daily  . folic acid  1 mg Oral Daily  . furosemide  80 mg Intravenous TID  . guaiFENesin  600 mg Oral BID  . heparin  5,000 Units Subcutaneous Q8H  . insulin aspart  0-9 Units Subcutaneous TID WC  . insulin aspart protamine- aspart  4 Units Subcutaneous BID WC  . metoprolol succinate  100 mg Oral QPM  . mirabegron ER  25 mg Oral Daily  . pravastatin  80 mg Oral QPM  . sodium chloride flush  3 mL Intravenous Q12H  . tamsulosin  0.4 mg Oral QPC supper   Continuous Infusions: . sodium chloride     PRN Meds:.sodium chloride, acetaminophen **OR** acetaminophen, albuterol, ondansetron **OR** ondansetron (ZOFRAN) IV, sodium chloride flush  Current Labs: reviewed    Physical Exam:  Blood pressure 134/60, pulse 69, temperature 98 F (36.7 C), resp. rate 19, height 6' (1.829 m), weight 134 kg, SpO2 96 %. NAD, chornically ill appearing,  Obese 3+ LEE, pitting Some abd wall edema Foley in place Regular, nl s1s2 no rub Nl mood/affect  A 1. AoCKD4, BL SCr 3, follows with [facility]. Acute decomp likely from TMP/SMX and #2 2. AoC dCHF exacerbation, diuresing well 3. AMS, hyperapnea, stable 4. Chornic urinar retention with foley  catheter 5. HTN 6. DM2 7. Anemia, s/p fereheme 1/13 and started ESA 1/12, stable CTM 8. Enterococcus faecalis pansensitive UTI, with chronic indwelling Foley catheter, per primary  P . Continue Lasix 80 IV TID today, still a ways to go to achieve euvolemia . Will need higher diuretic dosing at discharge . Daily weights, Daily Renal Panel, Strict I/Os, Avoid nephrotoxins (NSAIDs, judicious IV Contrast)  . Medication Issues; o Preferred narcotic agents for pain control are hydromorphone, fentanyl, and methadone. Morphine should not be used.  o Baclofen should be avoided o Avoid oral sodium phosphate and magnesium citrate based laxatives / bowel preps    Pearson Grippe MD 07/11/2019, 9:51 AM  Recent Labs  Lab 07/09/19 0555 07/10/19 0444 07/11/19 0302  NA 137 136 136  K 5.1 4.5 4.3  CL 105 104 101  CO2 23 23 26   GLUCOSE 187* 161* 164*  BUN 73* 75* 76*  CREATININE 4.68* 4.60* 4.22*  CALCIUM 8.6* 8.4* 8.4*  PHOS 5.2* 4.9*  --    Recent Labs  Lab 07/07/19 1700 07/08/19 0442 07/09/19 0555 07/10/19 0444 07/11/19 0302  WBC 8.0   < > 8.7 8.8 7.9  NEUTROABS 6.4  --   --   --   --   HGB 7.8*   < > 7.3* 7.4* 7.1*  HCT 24.7*   < > 23.6* 23.6* 22.7*  MCV 90.5   < > 91.8 91.1 91.2  PLT  188   < > 169 163 163   < > = values in this interval not displayed.

## 2019-07-11 NOTE — [place] FL2 (Signed)
[place] MEDICAID FL2 LEVEL OF CARE SCREENING TOOL     IDENTIFICATION  Patient Name: Danny [name] Birthdate: Dec 10, 1955 Sex: male Admission Date (Current Location): 07/07/2019  [facility] and [place] Number:  [employer] and Address:  [facility] [address] [place]      Provider Number: [id number]  Attending Physician Name and Address:  Rodena Goldmann, DO  Relative Name and Phone Number:  Hulan Amato  [phone]    Current Level of Care: Hospital Recommended Level of Care: [place] Prior Approval Number:    Date Approved/Denied:   PASRR Number: [id number]  Discharge Plan: SNF    Current Diagnoses: Patient Active Problem List   Diagnosis Date Noted  . AKI (acute kidney injury) ([place]) 07/08/2019  . Hypoxia 07/08/2019  . Stage I pressure ulcer of right ankle 07/08/2019  . Edema of both lower extremities   . Anasarca 03/23/2019  . Bladder outlet obstruction 03/23/2019  . Yeast infection of the skin 03/16/2019  . Diabetic ulcer of left foot ([place]) 03/15/2019  . Diabetic ulcer of right ankle ([place]) 03/15/2019  . Hypertension associated with stage 3 chronic kidney disease due to type 2 diabetes mellitus ([place]) 03/09/2019  . Controlled type 2 diabetes mellitus with stage 3 chronic kidney disease, with long-term current use of insulin ([place]) 03/09/2019  . Dyslipidemia associated with type 2 diabetes mellitus ([place]) 03/09/2019  . Chronic gout due to renal impairment without tophus 03/09/2019  . Emphysematous cystitis 03/05/2019  . Bilateral hydronephrosis   . Bilateral cellulitis of lower leg 03/03/2019  . Urinary retention 03/03/2019  . Klebsiella Cystitis 03/03/2019  . UTI due to Klebsiella species 03/03/2019  . Plantar ulcer of left foot ([place]) 03/03/2019  . Acute on chronic diastolic (congestive) heart failure ([place]) 02/25/2019  . Acute respiratory failure with hypoxia ([place]) 02/25/2019  . Acute renal failure  superimposed on stage 3 chronic kidney disease ([place]) 02/24/2019  . Congestive heart failure ([place]) 02/24/2019  . Dyspnea 02/23/2019  . Essential hypertension 02/23/2019  . Diabetes mellitus ([place]) 02/23/2019  . CKD (chronic kidney disease) 02/23/2019  . Psoriasis 02/23/2019    Orientation RESPIRATION BLADDER Height & Weight     Self, Time, Situation, Place  O2(2 L at present/ normally room air diuresing) Indwelling catheter Weight: 134 kg Height:  6' (182.9 cm)  BEHAVIORAL SYMPTOMS/MOOD NEUROLOGICAL BOWEL NUTRITION STATUS      Continent Diet(see DC Summary)  AMBULATORY STATUS COMMUNICATION OF NEEDS Skin   Extensive Assist Verbally Skin abrasions(right leg)                       Personal Care Assistance Level of Assistance  Bathing, Feeding, Dressing Bathing Assistance: Limited assistance Feeding assistance: Independent Dressing Assistance: Limited assistance     Functional Limitations Info  Sight, Hearing, Speech Sight Info: Adequate Hearing Info: Adequate Speech Info: Adequate    SPECIAL CARE FACTORS FREQUENCY  PT (By licensed PT)     PT Frequency: 5 times a week.              Contractures Contractures Info: Not present    Additional Factors Info  Code Status, Allergies Code Status Info: FULL Allergies Info: dust mite, prednisone, rocephin           Current Medications (07/11/2019):  This is the current hospital active medication list Current Facility-Administered Medications  Medication Dose Route Frequency Provider Last Rate Last Admin  . 0.9 %  sodium chloride infusion  250 mL Intravenous PRN Oswald Hillock, MD      . acetaminophen (TYLENOL) tablet 650 mg  650 mg Oral Q6H PRN Oswald Hillock, MD       Or  . acetaminophen (TYLENOL) suppository 650 mg  650 mg Rectal Q6H PRN Oswald Hillock, MD      . albuterol (PROVENTIL) (2.5 MG/3ML) 0.083% nebulizer solution 2.5 mg  2.5 mg Nebulization Q4H PRN Johnson, Clanford L, MD   2.5 mg at 07/09/19 0231  .  allopurinol (ZYLOPRIM) tablet 300 mg  300 mg Oral Daily Oswald Hillock, MD   300 mg at 07/11/19 0809  . amLODipine (NORVASC) tablet 10 mg  10 mg Oral QHS Oswald Hillock, MD   10 mg at 07/10/19 2156  . amoxicillin (AMOXIL) capsule 500 mg  500 mg Oral Q12H Shah, Pratik D, DO   500 mg at 07/11/19 1358  . Chlorhexidine Gluconate Cloth 2 % PADS 6 each  6 each Topical Daily Kathie Dike, MD   6 each at 07/11/19 1643  . folic acid (FOLVITE) tablet 1 mg  1 mg Oral Daily Oswald Hillock, MD   1 mg at 07/11/19 0809  . furosemide (LASIX) injection 80 mg  80 mg Intravenous TID Pearson Grippe B, MD   80 mg at 07/11/19 1643  . guaiFENesin (MUCINEX) 12 hr tablet 600 mg  600 mg Oral BID Oswald Hillock, MD   600 mg at 07/11/19 0809  . heparin injection 5,000 Units  5,000 Units Subcutaneous Q8H Oswald Hillock, MD   5,000 Units at 07/11/19 1358  . insulin aspart (novoLOG) injection 0-9 Units  0-9 Units Subcutaneous TID WC Oswald Hillock, MD   2 Units at 07/11/19 1642  . insulin aspart protamine- aspart (NOVOLOG MIX 70/30) injection 4 Units  4 Units Subcutaneous BID WC Johnson, Clanford L, MD   4 Units at 07/10/19 1721  . metoprolol succinate (TOPROL-XL) 24 hr tablet 100 mg  100 mg Oral QPM Oswald Hillock, MD   100 mg at 07/11/19 1642  . mirabegron ER (MYRBETRIQ) tablet 25 mg  25 mg Oral Daily Oswald Hillock, MD   25 mg at 07/11/19 0809  . ondansetron (ZOFRAN) tablet 4 mg  4 mg Oral Q6H PRN Oswald Hillock, MD       Or  . ondansetron (ZOFRAN) injection 4 mg  4 mg Intravenous Q6H PRN Oswald Hillock, MD      . pravastatin (PRAVACHOL) tablet 80 mg  80 mg Oral QPM Oswald Hillock, MD   80 mg at 07/11/19 1641  . sodium chloride flush (NS) 0.9 % injection 3 mL  3 mL Intravenous Q12H Oswald Hillock, MD   3 mL at 07/11/19 0810  . sodium chloride flush (NS) 0.9 % injection 3 mL  3 mL Intravenous PRN Oswald Hillock, MD      . tamsulosin (FLOMAX) capsule 0.4 mg  0.4 mg Oral QPC supper Oswald Hillock, MD   0.4 mg at 07/11/19 1642      Discharge Medications: Please see discharge summary for a list of discharge medications.  Relevant Imaging Results:  Relevant Lab Results:   Additional Information    Boneta Lucks, RN

## 2019-07-11 NOTE — Plan of Care (Signed)
  Problem: Acute Rehab PT Goals(only PT should resolve) Goal: Pt Will Go Supine/Side To Sit Outcome: Progressing Flowsheets (Taken 07/11/2019 1413) Pt will go Supine/Side to Sit: with supervision Goal: Patient Will Transfer Sit To/From Stand Outcome: Progressing Flowsheets (Taken 07/11/2019 1413) Patient will transfer sit to/from stand: with min guard assist Goal: Pt Will Transfer Bed To Chair/Chair To Bed Outcome: Progressing Flowsheets (Taken 07/11/2019 1413) Pt will Transfer Bed to Chair/Chair to Bed: min guard assist Goal: Pt Will Ambulate Outcome: Progressing Flowsheets (Taken 07/11/2019 1413) Pt will Ambulate:  50 feet  with minimal assist  with min guard assist  with rolling walker   2:13 PM, 07/11/19 Lonell Grandchild, MPT Physical Therapist with [facility] [phone] office [phone] mobile phone

## 2019-07-11 NOTE — TOC Initial Note (Signed)
Transition of Care [facility]) - Initial/Assessment Note    Patient Details  Name: Danny [name] MRN: [id number] Date of Birth: 28-Jan-1956  [facility]) CM/SW Contact:    Boneta Lucks, RN Phone Number: 07/11/2019, 4:54 PM  Clinical Narrative:      Patient admitted for acute kidney injury. Patient has a high risk for readmission. CM at the bedside, Patient lives alone, support from sister.  PT is recommending SNF, Patient is agreeable, States he stayed [facility] the [facility].  Gave permission to sent to multiple facilities and discuss bed offers as TOC gets them. TOC to follow.             Expected Discharge Plan: Skilled Nursing Facility Barriers to Discharge: Continued Medical Work up   Patient Goals and CMS Choice Patient states their goals for this hospitalization and ongoing recovery are:: to go to rehab then return home. CMS Medicare.gov Compare Post Acute Care list provided to:: Patient Choice offered to / list presented to : Patient  Expected Discharge Plan and Services Expected Discharge Plan: [place]       Prior Living Arrangements/Services   Lives with:: Self Patient language and need for interpreter reviewed:: Yes Do you feel safe going back to the place where you live?: Yes      Need for Family Participation in Patient Care: Yes (Comment) Care giver support system in place?: Yes (comment)   Criminal Activity/Legal Involvement Pertinent to Current Situation/Hospitalization: No - Comment as needed  Activities of Daily Living Home Assistive Devices/Equipment: CBG Meter, Walker (specify type), Dentures (specify type) ADL Screening (condition at time of admission) Patient's cognitive ability adequate to safely complete daily activities?: Yes Is the patient deaf or have difficulty hearing?: No Does the patient have difficulty seeing, even when wearing glasses/contacts?: No Does the patient have difficulty concentrating, remembering, or  making decisions?: No Patient able to express need for assistance with ADLs?: Yes Does the patient have difficulty dressing or bathing?: No Independently performs ADLs?: Yes (appropriate for developmental age) Does the patient have difficulty walking or climbing stairs?: Yes Weakness of Legs: Both Weakness of Arms/Hands: None  Permission Sought/Granted             Permission granted to share info w Contact Information: SNF  Emotional Assessment   Attitude/Demeanor/Rapport: Engaged Affect (typically observed): Accepting Orientation: : Oriented to Self, Oriented to Place, Oriented to  Time, Oriented to Situation Alcohol / Substance Use: Not Applicable Psych Involvement: No (comment)  Admission diagnosis:  Hypoxia [R09.02] Stage I pressure ulcer of right ankle [L89.511] AKI (acute kidney injury) ([place]) [N17.9] Congestive heart failure, unspecified HF chronicity, unspecified heart failure type (HCC) [I50.9] Pressure injury of skin of left foot, unspecified injury stage [L89.899] Edema of both lower extremities [R60.0] Patient Active Problem List   Diagnosis Date Noted  . AKI (acute kidney injury) ([place]) 07/08/2019  . Hypoxia 07/08/2019  . Stage I pressure ulcer of right ankle 07/08/2019  . Edema of both lower extremities   . Anasarca 03/23/2019  . Bladder outlet obstruction 03/23/2019  . Yeast infection of the skin 03/16/2019  . Diabetic ulcer of left foot ([place]) 03/15/2019  . Diabetic ulcer of right ankle ([place]) 03/15/2019  . Hypertension associated with stage 3 chronic kidney disease due to type 2 diabetes mellitus ([place]) 03/09/2019  . Controlled type 2 diabetes mellitus with stage 3 chronic kidney disease, with long-term current use of insulin ([place]) 03/09/2019  . Dyslipidemia associated with type 2 diabetes  mellitus ([place]) 03/09/2019  . Chronic gout due to renal impairment without tophus 03/09/2019  . Emphysematous cystitis 03/05/2019  . Bilateral hydronephrosis   . Bilateral  cellulitis of lower leg 03/03/2019  . Urinary retention 03/03/2019  . Klebsiella Cystitis 03/03/2019  . UTI due to Klebsiella species 03/03/2019  . Plantar ulcer of left foot ([place]) 03/03/2019  . Acute on chronic diastolic (congestive) heart failure ([place]) 02/25/2019  . Acute respiratory failure with hypoxia ([place]) 02/25/2019  . Acute renal failure superimposed on stage 3 chronic kidney disease ([place]) 02/24/2019  . Congestive heart failure ([place]) 02/24/2019  . Dyspnea 02/23/2019  . Essential hypertension 02/23/2019  . Diabetes mellitus ([place]) 02/23/2019  . CKD (chronic kidney disease) 02/23/2019  . Psoriasis 02/23/2019   PCP:  Celene Squibb, MD Pharmacy:   [place], [place] - [phone] [place] [address] [phone] [employer] [employer] [place] [address] Phone: [phone] Fax: [phone]   Readmission Risk Interventions Readmission Risk Prevention Plan 07/11/2019 03/05/2019  Transportation Screening Complete Complete  PCP or Specialist Appt within 3-5 Days - Not Complete  Not Complete comments - Going to facility and will be seen by facility medical director.  [place] or [place] - Complete  Social Work Consult for Recovery Care Planning/Counseling - Complete  Palliative Care Screening - Not Applicable  Medication Review Press photographer) Complete Complete  PCP or Specialist appointment within 3-5 days of discharge Not Complete -  [place] or Home Care Consult Complete -  SW Recovery Care/Counseling Consult Complete -  Palliative Care Screening Not Complete -  Skilled Nursing Facility Complete -  Some recent data might be hidden

## 2019-07-11 NOTE — Progress Notes (Addendum)
PROGRESS NOTE    Danny [name]  PJA:[id number] DOB: 05/15/1956 DOA: 07/07/2019 PCP: Celene Squibb, MD   Brief Narrative:  64 year old male with a history of diabetes, hypertension, urinary retention with chronic Foley catheter, chronic disease stage IV,admitted to the hospital with complaints of shortness of breath. He was noted to have decompensated CHF, acute kidney injury on chronic kidney disease stage IV and acute hypoxic respiratory failure. He is being seen by nephrology for worsening renal function. Started on IV Lasix. He became more somnolent and noted to have hypercapnia. Transferred to stepdown for BiPAP.  1/14: Patient continues to remain somewhat lethargic and somnolent this morning.  ABG does demonstrate some improvement and he is diuresing well with approximately 3.5 L urine output and decrease in weight.  Shortness of breath appears improved.  1/15: Patient is more awake and alert this morning and okay for transfer to telemetry today.  He continues to diurese quite well with a 5 L negative fluid balance documented with use of IV Lasix 3 times daily.  Plan to start amoxicillin for UTI and exchange Foley catheter today.  PT evaluation.  Assessment & Plan:   Active Problems:   Essential hypertension   Diabetes mellitus ([place])   CKD (chronic kidney disease)   Acute renal failure superimposed on stage 3 chronic kidney disease (HCC)   Congestive heart failure (HCC)   Acute on chronic diastolic (congestive) heart failure (HCC)   Urinary retention   Controlled type 2 diabetes mellitus with stage 3 chronic kidney disease, with long-term current use of insulin ([place])   Dyslipidemia associated with type 2 diabetes mellitus (HCC)   Chronic gout due to renal impairment without tophus   Bladder outlet obstruction   AKI (acute kidney injury) ([place])   Hypoxia   Stage I pressure ulcer of right ankle   Edema of both lower extremities   1. Acute respiratory failure with  hypercapnia-improved and back to baseline.  Patient refuses further BiPAP and will remain on nasal cannula oxygen for now which we will wean as tolerated.  Okay for transfer to telemetry. 2. Hypercapnic encephalopathy-resolved.    More awake and alert and okay for transfer to telemetry at this point. 3. Enterococcal UTI in the setting of chronic Foley catheter use.  Exchange of Foley catheter today and plan to start amoxicillin.  He appears to be asymptomatic from this.  Appears to also have some candiduria related to chronic Foley use. 4. Acute kidney injury on chronic kidney disease stage IV. Felt to be related to Bactrim use as well as acute diastolic congestive heart failure.Nephrology following. Started on intravenous Lasix, now 3 times daily. Continues to have steady improvement at this time.  Continue to monitor urine output with 5 L diuresed. 5. Acute diastolic congestive heart failure-improving. Currently on intravenous Lasix. Monitor urine output. He does have evidence of volume overload. 6. Pleural effusions. Noted on chest x-ray. Likely related to volume overload from CHF/renal disease. 7. Anemia. Likely related to chronic kidney disease. Started on Aranesp. No signs of bleeding at this time. Continue to monitor. Transfuse for hemoglobin less than 7.  Currently stable, but downtrending and may require PRBC transfusion by a.m. 8. Diabetes. Holding oral hypoglycemics. Continue on sliding scale. Blood sugar stable. 9. Hypertension. Blood pressure stable on home dose of metoprolol.   DVT prophylaxis: Heparin Code Status: Full Family Communication: We will plan to call sister Disposition Plan:  Continue diuresis per nephrology and start amoxicillin for enterococcal UTI.  Exchange of  Foley catheter.  Plan for transfer to telemetry.  PT evaluation.   Consultants:   Nephrology  Procedures:   None  Antimicrobials:  Anti-infectives (From admission, onward)   Start      Dose/Rate Route Frequency Ordered Stop   07/11/19 1215  amoxicillin (AMOXIL) capsule 500 mg     500 mg Oral Every 12 hours 07/11/19 1209     07/11/19 1145  nitrofurantoin (macrocrystal-monohydrate) (MACROBID) capsule 100 mg  Status:  Discontinued     100 mg Oral Every 12 hours 07/11/19 1130 07/11/19 1209      Subjective: Patient seen and evaluated today with desire to sit up into chair.  He requires a lot of assistance for this.  He denies any dysuria.  He has been diuresing quite well.  Objective: Vitals:   07/11/19 0400 07/11/19 0500 07/11/19 0600 07/11/19 0800  BP: (!) 122/44 131/60 134/60   Pulse: 72 70 69   Resp: 16 19 19    Temp: 98.1 F (36.7 C)   98 F (36.7 C)  TempSrc: Oral     SpO2: 96% 94% 96%   Weight:  134 kg    Height:        Intake/Output Summary (Last 24 hours) at 07/11/2019 1209 Last data filed at 07/11/2019 0600 Gross per 24 hour  Intake 400 ml  Output 4400 ml  Net -4000 ml   Filed Weights   07/09/19 1307 07/10/19 0500 07/11/19 0500  Weight: (!) 140.6 kg (!) 140.6 kg 134 kg    Examination:  General exam: Appears calm and comfortable  Respiratory system: Clear to auscultation. Respiratory effort normal.  Currently on 2-3 L nasal cannula oxygen Cardiovascular system: S1 & S2 heard, RRR. No JVD, murmurs, rubs, gallops or clicks. No pedal edema. Gastrointestinal system: Abdomen is nondistended, soft and nontender. No organomegaly or masses felt. Normal bowel sounds heard. Central nervous system: Alert and awake. Extremities: Ongoing mild bilateral edema. Skin: No rashes, lesions or ulcers Psychiatry: Appears upset. Foley bag with clear, yellow urine output.    Data Reviewed: I have personally reviewed following labs and imaging studies  CBC: Recent Labs  Lab 07/07/19 1700 07/08/19 0442 07/09/19 0555 07/10/19 0444 07/11/19 0302  WBC 8.0 7.9 8.7 8.8 7.9  NEUTROABS 6.4  --   --   --   --   HGB 7.8* 7.4* 7.3* 7.4* 7.1*  HCT 24.7* 24.0* 23.6*  23.6* 22.7*  MCV 90.5 91.6 91.8 91.1 91.2  PLT 188 170 169 163 423   Basic Metabolic Panel: Recent Labs  Lab 07/07/19 1700 07/08/19 0442 07/09/19 0555 07/10/19 0444 07/11/19 0302  NA 137 137 137 136 136  K 4.8 5.0 5.1 4.5 4.3  CL 108 108 105 104 101  CO2 24 23 23 23 26   GLUCOSE 168* 77 187* 161* 164*  BUN 69* 68* 73* 75* 76*  CREATININE 4.57* 4.62* 4.68* 4.60* 4.22*  CALCIUM 8.8* 8.7* 8.6* 8.4* 8.4*  MG  --   --  2.3  --  2.2  PHOS  --   --  5.2* 4.9*  --    GFR: Estimated Creatinine Clearance: 24.7 mL/min (A) (by C-G formula based on SCr of 4.22 mg/dL (H)). Liver Function Tests: Recent Labs  Lab 07/07/19 1700 07/08/19 0442 07/09/19 0555 07/10/19 0444  AST 48* 37  --   --   ALT 56* 53*  --   --   ALKPHOS 106 99  --   --   BILITOT 1.1 1.0  --   --  PROT 7.2 6.8  --   --   ALBUMIN 3.2* 3.0* 3.0* 3.1*   No results for input(s): LIPASE, AMYLASE in the last 168 hours. No results for input(s): AMMONIA in the last 168 hours. Coagulation Profile: No results for input(s): INR, PROTIME in the last 168 hours. Cardiac Enzymes: No results for input(s): CKTOTAL, CKMB, CKMBINDEX, TROPONINI in the last 168 hours. BNP (last 3 results) No results for input(s): PROBNP in the last 8760 hours. HbA1C: No results for input(s): HGBA1C in the last 72 hours. CBG: Recent Labs  Lab 07/10/19 1143 07/10/19 1708 07/10/19 2248 07/11/19 0747 07/11/19 1146  GLUCAP 157* 139* 161* 117* 145*   Lipid Profile: No results for input(s): CHOL, HDL, LDLCALC, TRIG, CHOLHDL, LDLDIRECT in the last 72 hours. Thyroid Function Tests: No results for input(s): TSH, T4TOTAL, FREET4, T3FREE, THYROIDAB in the last 72 hours. Anemia Panel: No results for input(s): VITAMINB12, FOLATE, FERRITIN, TIBC, IRON, RETICCTPCT in the last 72 hours. Sepsis Labs: No results for input(s): PROCALCITON, LATICACIDVEN in the last 168 hours.  Recent Results (from the past 240 hour(s))  Culture, Urine     Status: Abnormal    Collection Time: 07/07/19  5:14 PM   Specimen: Urine, Clean Catch  Result Value Ref Range Status   Specimen Description   Final    URINE, CLEAN CATCH Performed at [facility], [address], [place], [place] [address]    Special Requests   Final    NONE Performed at [facility], [address], [place], [place] [address]    Culture (A)  Final    >=100,000 COLONIES/mL ENTEROCOCCUS FAECALIS >=100,000 COLONIES/mL YEAST    Report Status 07/10/2019 FINAL  Final   Organism ID, Bacteria ENTEROCOCCUS FAECALIS (A)  Final      Susceptibility   Enterococcus faecalis - MIC*    AMPICILLIN <=2 SENSITIVE Sensitive     NITROFURANTOIN <=16 SENSITIVE Sensitive     VANCOMYCIN 1 SENSITIVE Sensitive     * >=100,000 COLONIES/mL ENTEROCOCCUS FAECALIS  SARS CORONAVIRUS 2 (TAT 6-24 HRS) Nasopharyngeal Nasopharyngeal Swab     Status: None   Collection Time: 07/07/19  9:51 PM   Specimen: Nasopharyngeal Swab  Result Value Ref Range Status   SARS Coronavirus 2 NEGATIVE NEGATIVE Final    Comment: (NOTE) SARS-CoV-2 target nucleic acids are NOT DETECTED. The SARS-CoV-2 RNA is generally detectable in upper and lower respiratory specimens during the acute phase of infection. Negative results do not preclude SARS-CoV-2 infection, do not rule out co-infections with other pathogens, and should not be used as the sole basis for treatment or other patient management decisions. Negative results must be combined with clinical observations, patient history, and epidemiological information. The expected result is Negative. Fact Sheet for Patients: SugarRoll.be Fact Sheet for Healthcare Providers: https://www.woods-mathews.com/ This test is not yet approved or cleared by the [place] FDA and  has been authorized for detection and/or diagnosis of SARS-CoV-2 by FDA under an Emergency Use Authorization (EUA). This EUA will remain  in effect (meaning this test can be  used) for the duration of the COVID-19 declaration under Section 56 4(b)(1) of the Act, 21 U.S.C. section 360bbb-3(b)(1), unless the authorization is terminated or revoked sooner. Performed at [facility], [place] [address], [place], [place] [address]   Respiratory Panel by RT PCR (Flu A&B, Covid) - Nasopharyngeal Swab     Status: None   Collection Time: 07/08/19 12:47 AM   Specimen: Nasopharyngeal Swab  Result Value Ref Range Status  SARS Coronavirus 2 by RT PCR NEGATIVE NEGATIVE Final    Comment: (NOTE) SARS-CoV-2 target nucleic acids are NOT DETECTED. The SARS-CoV-2 RNA is generally detectable in upper respiratoy specimens during the acute phase of infection. The lowest concentration of SARS-CoV-2 viral copies this assay can detect is 131 copies/mL. A negative result does not preclude SARS-Cov-2 infection and should not be used as the sole basis for treatment or other patient management decisions. A negative result may occur with  improper specimen collection/handling, submission of specimen other than nasopharyngeal swab, presence of viral mutation(s) within the areas targeted by this assay, and inadequate number of viral copies (<131 copies/mL). A negative result must be combined with clinical observations, patient history, and epidemiological information. The expected result is Negative. Fact Sheet for Patients:  PinkCheek.be Fact Sheet for Healthcare Providers:  GravelBags.it This test is not yet ap proved or cleared by the [place] FDA and  has been authorized for detection and/or diagnosis of SARS-CoV-2 by FDA under an Emergency Use Authorization (EUA). This EUA will remain  in effect (meaning this test can be used) for the duration of the COVID-19 declaration under Section 564(b)(1) of the Act, 21 U.S.C. section 360bbb-3(b)(1), unless the authorization is terminated or revoked sooner.    Influenza A by  PCR NEGATIVE NEGATIVE Final   Influenza B by PCR NEGATIVE NEGATIVE Final    Comment: (NOTE) The Xpert Xpress SARS-CoV-2/FLU/RSV assay is intended as an aid in  the diagnosis of influenza from Nasopharyngeal swab specimens and  should not be used as a sole basis for treatment. Nasal washings and  aspirates are unacceptable for Xpert Xpress SARS-CoV-2/FLU/RSV  testing. Fact Sheet for Patients: PinkCheek.be Fact Sheet for Healthcare Providers: GravelBags.it This test is not yet approved or cleared by the [place] FDA and  has been authorized for detection and/or diagnosis of SARS-CoV-2 by  FDA under an Emergency Use Authorization (EUA). This EUA will remain  in effect (meaning this test can be used) for the duration of the  Covid-19 declaration under Section 564(b)(1) of the Act, 21  U.S.C. section 360bbb-3(b)(1), unless the authorization is  terminated or revoked. Performed at [facility], [address], [place], [place] [address]   MRSA PCR Screening     Status: None   Collection Time: 07/09/19 12:15 PM   Specimen: Nasal Mucosa; Nasopharyngeal  Result Value Ref Range Status   MRSA by PCR NEGATIVE NEGATIVE Final    Comment:        The GeneXpert MRSA Assay (FDA approved for NASAL specimens only), is one component of a comprehensive MRSA colonization surveillance program. It is not intended to diagnose MRSA infection nor to guide or monitor treatment for MRSA infections. Performed at [facility], [address], [place], [place] [address]          Radiology Studies: No results found.      Scheduled Meds: . allopurinol  300 mg Oral Daily  . amLODipine  10 mg Oral QHS  . amoxicillin  500 mg Oral Q12H  . Chlorhexidine Gluconate Cloth  6 each Topical Daily  . folic acid  1 mg Oral Daily  . furosemide  80 mg Intravenous TID  . guaiFENesin  600 mg Oral BID  . heparin  5,000 Units Subcutaneous Q8H  .  insulin aspart  0-9 Units Subcutaneous TID WC  . insulin aspart protamine- aspart  4 Units Subcutaneous BID WC  . metoprolol succinate  100 mg Oral QPM  . mirabegron ER  25 mg Oral Daily  . pravastatin  80 mg Oral QPM  . sodium chloride flush  3 mL Intravenous Q12H  . tamsulosin  0.4 mg Oral QPC supper   Continuous Infusions: . sodium chloride       LOS: 3 days    Time spent: 30 minutes    Todrick Siedschlag Darleen Crocker, DO [facility] Pager [phone]  If 7PM-7AM, please contact night-coverage [email or web address] Password [facility] 07/11/2019, 12:09 PM

## 2019-07-12 LAB — BASIC METABOLIC PANEL
Anion gap: 11 (ref 5–15)
BUN: 79 mg/dL — ABNORMAL HIGH (ref 8–23)
CO2: 26 mmol/L (ref 22–32)
Calcium: 8.7 mg/dL — ABNORMAL LOW (ref 8.9–10.3)
Chloride: 101 mmol/L (ref 98–111)
Creatinine, Ser: 4.19 mg/dL — ABNORMAL HIGH (ref 0.61–1.24)
GFR calc Af Amer: 16 mL/min — ABNORMAL LOW (ref >60)
GFR calc non Af Amer: 14 mL/min — ABNORMAL LOW (ref >60)
Glucose, Bld: 123 mg/dL — ABNORMAL HIGH (ref 70–99)
Potassium: 3.8 mmol/L (ref 3.5–5.1)
Sodium: 138 mmol/L (ref 135–145)

## 2019-07-12 LAB — CBC
HCT: 22.1 % — ABNORMAL LOW (ref 39.0–52.0)
Hemoglobin: 7 g/dL — ABNORMAL LOW (ref 13.0–17.0)
MCH: 28.8 pg (ref 26.0–34.0)
MCHC: 31.7 g/dL (ref 30.0–36.0)
MCV: 90.9 fL (ref 80.0–100.0)
Platelets: 176 10*3/uL (ref 150–400)
RBC: 2.43 MIL/uL — ABNORMAL LOW (ref 4.22–5.81)
RDW: 18.3 % — ABNORMAL HIGH (ref 11.5–15.5)
WBC: 7.8 10*3/uL (ref 4.0–10.5)
nRBC: 0 % (ref 0.0–0.2)

## 2019-07-12 LAB — GLUCOSE, CAPILLARY
Glucose-Capillary: 126 mg/dL — ABNORMAL HIGH (ref 70–99)
Glucose-Capillary: 135 mg/dL — ABNORMAL HIGH (ref 70–99)
Glucose-Capillary: 167 mg/dL — ABNORMAL HIGH (ref 70–99)
Glucose-Capillary: 196 mg/dL — ABNORMAL HIGH (ref 70–99)

## 2019-07-12 MED ORDER — DARBEPOETIN ALFA 150 MCG/0.3ML IJ SOSY
150.0000 ug | PREFILLED_SYRINGE | INTRAMUSCULAR | Status: DC
  Administered 2019-07-12: 150 ug via SUBCUTANEOUS
  Filled 2019-07-12: qty 0.3

## 2019-07-12 MED ORDER — SODIUM CHLORIDE 0.9 % IV SOLN
510.0000 mg | Freq: Once | INTRAVENOUS | Status: AC
  Administered 2019-07-12: 510 mg via INTRAVENOUS
  Filled 2019-07-12: qty 17

## 2019-07-12 MED ORDER — POTASSIUM CHLORIDE CRYS ER 20 MEQ PO TBCR
40.0000 meq | EXTENDED_RELEASE_TABLET | Freq: Once | ORAL | Status: AC
  Administered 2019-07-12: 40 meq via ORAL
  Filled 2019-07-12: qty 2

## 2019-07-12 NOTE — Progress Notes (Signed)
Admit: 07/07/2019 LOS: 4  71M DM2, chonic foley catheter with sCHF exacerbation, hypervolemia  Subjective:  . Creatinine stable, K3.8, HCO3 26 . 2.5 L urine output, 1300 so far today,  3.5  kg down on weight .  on 3 L nasal cannula, in chair . No complaints or concerns . Urine culture with greater than 100,000 CFU's of pansensitive enterococcal faecalis . Still has significant edema, legs wrapped, into the proximal legs  01/15 0701 - 01/16 0700 In: 1940 [P.O.:240] Out: 2500 [Urine:2500]  Filed Weights   07/11/19 0500 07/11/19 1900 07/12/19 0500  Weight: 134 kg 134.4 kg 130.9 kg    Scheduled Meds: . allopurinol  300 mg Oral Daily  . amLODipine  10 mg Oral QHS  . amoxicillin  500 mg Oral Q12H  . Chlorhexidine Gluconate Cloth  6 each Topical Daily  . folic acid  1 mg Oral Daily  . furosemide  80 mg Intravenous TID  . guaiFENesin  600 mg Oral BID  . heparin  5,000 Units Subcutaneous Q8H  . insulin aspart  0-9 Units Subcutaneous TID WC  . insulin aspart protamine- aspart  4 Units Subcutaneous BID WC  . metoprolol succinate  100 mg Oral QPM  . mirabegron ER  25 mg Oral Daily  . pravastatin  80 mg Oral QPM  . sodium chloride flush  3 mL Intravenous Q12H  . tamsulosin  0.4 mg Oral QPC supper   Continuous Infusions: . sodium chloride     PRN Meds:.sodium chloride, acetaminophen **OR** acetaminophen, albuterol, ondansetron **OR** ondansetron (ZOFRAN) IV, sodium chloride flush  Current Labs: reviewed    Physical Exam:  Blood pressure (!) 125/57, pulse 71, temperature 98.5 F (36.9 C), temperature source Oral, resp. rate 19, height 6' (1.829 m), weight 130.9 kg, SpO2 98 %. NAD, chornically ill appearing,  Obese 3+ LEE, pitting Some abd wall edema Foley in place Regular, nl s1s2 no rub Nl mood/affect  A 1. AoCKD4, BL SCr 3, follows with Danny [name]. Acute decomp likely from TMP/SMX and #2 2. AoC dCHF exacerbation, diuresing well 3. AMS, hyperapnea, stable 4. Chronic urinar  retention with foley catheter 5. HTN 6. DM2 7. Anemia, s/p fereheme 1/13 and started ESA 1/12, stable CTM.  Will redose feraheme today 1/16.  Also will give dose of ESA 8. Enterococcus faecalis pansensitive UTI, with chronic indwelling Foley catheter, per primary-  Probably does not need treatment in this setting 9. hypokalemia  P . Continue Lasix 80 IV TID today, still a ways to go to achieve euvolemia . Will need higher diuretic dosing at discharge . Daily weights, Daily Renal Panel, Strict I/Os, Avoid nephrotoxins (NSAIDs, judicious IV Contrast)  . Mild hypokalemia-  Will give some today - 40 meq times 1 . Medication Issues; o Preferred narcotic agents for pain control are hydromorphone, fentanyl, and methadone. Morphine should not be used.  o Baclofen should be avoided o Avoid oral sodium phosphate and magnesium citrate based laxatives / bowel preps    Renal will not physically see tomorrow (Sunday)  Will look at [place] and labs and act accordingly.  Call with questions, will revisit on Monday    Danny [name] A Chamberlain  07/12/2019, 1:36 PM  Recent Labs  Lab 07/09/19 0555 07/09/19 0555 07/10/19 0444 07/11/19 0302 07/12/19 0654  NA 137   < > 136 136 138  K 5.1   < > 4.5 4.3 3.8  CL 105   < > 104 101 101  CO2 23   < > 23  26 26  GLUCOSE 187*   < > 161* 164* 123*  BUN 73*   < > 75* 76* 79*  CREATININE 4.68*   < > 4.60* 4.22* 4.19*  CALCIUM 8.6*   < > 8.4* 8.4* 8.7*  PHOS 5.2*  --  4.9*  --   --    < > = values in this interval not displayed.   Recent Labs  Lab 07/07/19 1700 07/08/19 0442 07/10/19 0444 07/11/19 0302 07/12/19 0654  WBC 8.0   < > 8.8 7.9 7.8  NEUTROABS 6.4  --   --   --   --   HGB 7.8*   < > 7.4* 7.1* 7.0*  HCT 24.7*   < > 23.6* 22.7* 22.1*  MCV 90.5   < > 91.1 91.2 90.9  PLT 188   < > 163 163 176   < > = values in this interval not displayed.

## 2019-07-12 NOTE — Progress Notes (Signed)
Patient called nurse to room c/o "heart racing and working so hard". Sitting in chair on assessment, HR in the 60s, NSR on telemetry. Vitals stable. Denied chest pain or discomfort, dyspnea, dizziness or feeling faint. Reported "it only lasted a few seconds but made me feel crummy". States "it happens at home some times and scares me." notified Dr. Manuella Ghazi. Nursing to continue monitoring and notify MD of any further occurrences. Donavan Foil, RN

## 2019-07-12 NOTE — Progress Notes (Signed)
PROGRESS NOTE    Danny [name]  MBE:[id number] DOB: 19-Nov-1955 DOA: 07/07/2019 PCP: Celene Squibb, MD   Brief Narrative:  64 year old male with a history of diabetes, hypertension, urinary retention with chronic Foley catheter, chronic disease stage IV,admitted to the hospital with complaints of shortness of breath. He was noted to have decompensated CHF, acute kidney injury on chronic kidney disease stage IV and acute hypoxic respiratory failure. He is being seen by nephrology for worsening renal function. Started on IV Lasix. He became more somnolent and noted to have hypercapnia. Transferred to stepdown for BiPAP.  1/14: Patient continues to remain somewhat lethargic and somnolent this morning. ABG does demonstrate some improvement and he is diuresing well with approximately 3.5 L urine output and decrease in weight. Shortness of breath appears improved.  1/15: Patient is more awake and alert this morning and okay for transfer to telemetry today.  He continues to diurese quite well with a 5 L negative fluid balance documented with use of IV Lasix 3 times daily.  Plan to start amoxicillin for UTI and exchange Foley catheter today.  PT evaluation.  1/16: Patient continues to have some mild improvements in creatinine levels and stability is noted.  Fluid balance is slightly -2500 mL fluid output noted.  PT recommendations for SNF on discharge.  Continues on Amoxil for UTI.  Assessment & Plan:   Active Problems:   Essential hypertension   Diabetes mellitus ([employer])   CKD (chronic kidney disease)   Acute renal failure superimposed on stage 3 chronic kidney disease (HCC)   Congestive heart failure (HCC)   Acute on chronic diastolic (congestive) heart failure (HCC)   Urinary retention   Controlled type 2 diabetes mellitus with stage 3 chronic kidney disease, with long-term current use of insulin ([place])   Dyslipidemia associated with type 2 diabetes mellitus (HCC)   Chronic gout due to renal  impairment without tophus   Bladder outlet obstruction   AKI (acute kidney injury) ([place])   Hypoxia   Stage I pressure ulcer of right ankle   Edema of both lower extremities   1. Acute respiratory failure with hypercapnia-improved and back to baseline.  Patient refuses further BiPAP and will remain on nasal cannula oxygen for now which we will wean as tolerated.  Currently on 3 L nasal cannula. 2. Hypercapnic encephalopathy-resolved.   More awake and alert and okay for transfer to telemetry at this point. 3. Enterococcal UTI in the setting of chronic Foley catheter use.  Exchange of Foley catheter today and plan to start amoxicillin.  He appears to be asymptomatic from this.  Appears to also have some candiduria related to chronic Foley use. 4. Acute kidney injury on chronic kidney disease stage IV. Felt to be related to Bactrim useas well as acute diastolic congestive heart failure.Nephrology following. Started on intravenous Lasix, now 3 times daily. Continues to have steady improvement at this time.  Continue to monitor urine output carefully. 5. Acute diastolic congestive heart failure-improving. Currently on intravenous Lasix. Monitor urine output. He does have evidence of volume overload. 6. Pleural effusions. Noted on chest x-ray. Likely related to volume overload from CHF/renal disease. 7. Anemia. Likely related to chronic kidney disease. Started on Aranesp. No signs of bleeding at this time. Continue to monitor. Transfuse for hemoglobin less than 7.Currently stable, but downtrending and may require PRBC transfusion by a.m. 8. Diabetes. Holding oral hypoglycemics. Continue on sliding scale. Blood sugar stable. 9. Hypertension. Blood pressure stable on home dose of metoprolol.  DVT prophylaxis:Heparin Code Status:Full Family Communication:We will plan to call sister Disposition Plan: Continue diuresis per nephrology and continue amoxicillin for enterococcal  UTI.  PT evaluation with recommendations for SNF on discharge.   Consultants:  Nephrology  Procedures:  None  Antimicrobials:  Anti-infectives (From admission, onward)   Start     Dose/Rate Route Frequency Ordered Stop   07/11/19 1215  amoxicillin (AMOXIL) capsule 500 mg     500 mg Oral Every 12 hours 07/11/19 1209     07/11/19 1145  nitrofurantoin (macrocrystal-monohydrate) (MACROBID) capsule 100 mg  Status:  Discontinued     100 mg Oral Every 12 hours 07/11/19 1130 07/11/19 1209       Subjective: Patient seen and evaluated today with no new acute complaints or concerns. No acute concerns or events noted overnight.  Objective: Vitals:   07/11/19 1900 07/11/19 2118 07/12/19 0500 07/12/19 0534  BP:  (!) 127/52  (!) 144/62  Pulse:  63  67  Resp:  20  19  Temp:  97.7 F (36.5 C)  98 F (36.7 C)  TempSrc:  Oral  Oral  SpO2:  99%  96%  Weight: 134.4 kg  130.9 kg   Height:        Intake/Output Summary (Last 24 hours) at 07/12/2019 1022 Last data filed at 07/12/2019 0900 Gross per 24 hour  Intake 2180 ml  Output 2500 ml  Net -320 ml   Filed Weights   07/11/19 0500 07/11/19 1900 07/12/19 0500  Weight: 134 kg 134.4 kg 130.9 kg    Examination:  General exam: Appears calm and comfortable  Respiratory system: Clear to auscultation. Respiratory effort normal.  Currently on 3 L nasal cannula oxygen. Cardiovascular system: S1 & S2 heard, RRR. No JVD, murmurs, rubs, gallops or clicks. No pedal edema. Gastrointestinal system: Abdomen is nondistended, soft and nontender. No organomegaly or masses felt. Normal bowel sounds heard. Central nervous system: Alert and oriented. No focal neurological deficits. Extremities: Symmetric 5 x 5 power. Skin: No rashes, lesions or ulcers Psychiatry: Judgement and insight appear normal. Mood & affect appropriate.     Data Reviewed: I have personally reviewed following labs and imaging studies  CBC: Recent Labs  Lab  07/07/19 1700 07/07/19 1700 07/08/19 0442 07/09/19 0555 07/10/19 0444 07/11/19 0302 07/12/19 0654  WBC 8.0   < > 7.9 8.7 8.8 7.9 7.8  NEUTROABS 6.4  --   --   --   --   --   --   HGB 7.8*   < > 7.4* 7.3* 7.4* 7.1* 7.0*  HCT 24.7*   < > 24.0* 23.6* 23.6* 22.7* 22.1*  MCV 90.5   < > 91.6 91.8 91.1 91.2 90.9  PLT 188   < > 170 169 163 163 176   < > = values in this interval not displayed.   Basic Metabolic Panel: Recent Labs  Lab 07/08/19 0442 07/09/19 0555 07/10/19 0444 07/11/19 0302 07/12/19 0654  NA 137 137 136 136 138  K 5.0 5.1 4.5 4.3 3.8  CL 108 105 104 101 101  CO2 23 23 23 26 26   GLUCOSE 77 187* 161* 164* 123*  BUN 68* 73* 75* 76* 79*  CREATININE 4.62* 4.68* 4.60* 4.22* 4.19*  CALCIUM 8.7* 8.6* 8.4* 8.4* 8.7*  MG  --  2.3  --  2.2  --   PHOS  --  5.2* 4.9*  --   --    GFR: Estimated Creatinine Clearance: 24.6 mL/min (A) (by C-G formula based  on SCr of 4.19 mg/dL (H)). Liver Function Tests: Recent Labs  Lab 07/07/19 1700 07/08/19 0442 07/09/19 0555 07/10/19 0444  AST 48* 37  --   --   ALT 56* 53*  --   --   ALKPHOS 106 99  --   --   BILITOT 1.1 1.0  --   --   PROT 7.2 6.8  --   --   ALBUMIN 3.2* 3.0* 3.0* 3.1*   No results for input(s): LIPASE, AMYLASE in the last 168 hours. No results for input(s): AMMONIA in the last 168 hours. Coagulation Profile: No results for input(s): INR, PROTIME in the last 168 hours. Cardiac Enzymes: No results for input(s): CKTOTAL, CKMB, CKMBINDEX, TROPONINI in the last 168 hours. BNP (last 3 results) No results for input(s): PROBNP in the last 8760 hours. HbA1C: No results for input(s): HGBA1C in the last 72 hours. CBG: Recent Labs  Lab 07/11/19 0747 07/11/19 1146 07/11/19 1608 07/11/19 2126 07/12/19 0734  GLUCAP 117* 145* 175* 170* 126*   Lipid Profile: No results for input(s): CHOL, HDL, LDLCALC, TRIG, CHOLHDL, LDLDIRECT in the last 72 hours. Thyroid Function Tests: No results for input(s): TSH, T4TOTAL,  FREET4, T3FREE, THYROIDAB in the last 72 hours. Anemia Panel: No results for input(s): VITAMINB12, FOLATE, FERRITIN, TIBC, IRON, RETICCTPCT in the last 72 hours. Sepsis Labs: No results for input(s): PROCALCITON, LATICACIDVEN in the last 168 hours.  Recent Results (from the past 240 hour(s))  Culture, Urine     Status: Abnormal   Collection Time: 07/07/19  5:14 PM   Specimen: Urine, Clean Catch  Result Value Ref Range Status   Specimen Description   Final    URINE, CLEAN CATCH Performed at [facility], [address], [place], [place] [address]    Special Requests   Final    NONE Performed at [facility], [address], [place], [place] [address]    Culture (A)  Final    >=100,000 COLONIES/mL ENTEROCOCCUS FAECALIS >=100,000 COLONIES/mL YEAST    Report Status 07/10/2019 FINAL  Final   Organism ID, Bacteria ENTEROCOCCUS FAECALIS (A)  Final      Susceptibility   Enterococcus faecalis - MIC*    AMPICILLIN <=2 SENSITIVE Sensitive     NITROFURANTOIN <=16 SENSITIVE Sensitive     VANCOMYCIN 1 SENSITIVE Sensitive     * >=100,000 COLONIES/mL ENTEROCOCCUS FAECALIS  SARS CORONAVIRUS 2 (TAT 6-24 HRS) Nasopharyngeal Nasopharyngeal Swab     Status: None   Collection Time: 07/07/19  9:51 PM   Specimen: Nasopharyngeal Swab  Result Value Ref Range Status   SARS Coronavirus 2 NEGATIVE NEGATIVE Final    Comment: (NOTE) SARS-CoV-2 target nucleic acids are NOT DETECTED. The SARS-CoV-2 RNA is generally detectable in upper and lower respiratory specimens during the acute phase of infection. Negative results do not preclude SARS-CoV-2 infection, do not rule out co-infections with other pathogens, and should not be used as the sole basis for treatment or other patient management decisions. Negative results must be combined with clinical observations, patient history, and epidemiological information. The expected result is Negative. Fact Sheet for  Patients: SugarRoll.be Fact Sheet for Healthcare Providers: https://www.woods-mathews.com/ This test is not yet approved or cleared by the [place] FDA and  has been authorized for detection and/or diagnosis of SARS-CoV-2 by FDA under an Emergency Use Authorization (EUA). This EUA will remain  in effect (meaning this test can be used) for the duration of the COVID-19 declaration under Section 56 4(b)(1) of the Act, 21  U.S.C. section 360bbb-3(b)(1), unless the authorization is terminated or revoked sooner. Performed at [facility], [place] [address], [place], [place] [address]   Respiratory Panel by RT PCR (Flu A&B, Covid) - Nasopharyngeal Swab     Status: None   Collection Time: 07/08/19 12:47 AM   Specimen: Nasopharyngeal Swab  Result Value Ref Range Status   SARS Coronavirus 2 by RT PCR NEGATIVE NEGATIVE Final    Comment: (NOTE) SARS-CoV-2 target nucleic acids are NOT DETECTED. The SARS-CoV-2 RNA is generally detectable in upper respiratoy specimens during the acute phase of infection. The lowest concentration of SARS-CoV-2 viral copies this assay can detect is 131 copies/mL. A negative result does not preclude SARS-Cov-2 infection and should not be used as the sole basis for treatment or other patient management decisions. A negative result may occur with  improper specimen collection/handling, submission of specimen other than nasopharyngeal swab, presence of viral mutation(s) within the areas targeted by this assay, and inadequate number of viral copies (<131 copies/mL). A negative result must be combined with clinical observations, patient history, and epidemiological information. The expected result is Negative. Fact Sheet for Patients:  PinkCheek.be Fact Sheet for Healthcare Providers:  GravelBags.it This test is not yet ap proved or cleared by the [place] FDA  and  has been authorized for detection and/or diagnosis of SARS-CoV-2 by FDA under an Emergency Use Authorization (EUA). This EUA will remain  in effect (meaning this test can be used) for the duration of the COVID-19 declaration under Section 564(b)(1) of the Act, 21 U.S.C. section 360bbb-3(b)(1), unless the authorization is terminated or revoked sooner.    Influenza A by PCR NEGATIVE NEGATIVE Final   Influenza B by PCR NEGATIVE NEGATIVE Final    Comment: (NOTE) The Xpert Xpress SARS-CoV-2/FLU/RSV assay is intended as an aid in  the diagnosis of influenza from Nasopharyngeal swab specimens and  should not be used as a sole basis for treatment. Nasal washings and  aspirates are unacceptable for Xpert Xpress SARS-CoV-2/FLU/RSV  testing. Fact Sheet for Patients: PinkCheek.be Fact Sheet for Healthcare Providers: GravelBags.it This test is not yet approved or cleared by the [place] FDA and  has been authorized for detection and/or diagnosis of SARS-CoV-2 by  FDA under an Emergency Use Authorization (EUA). This EUA will remain  in effect (meaning this test can be used) for the duration of the  Covid-19 declaration under Section 564(b)(1) of the Act, 21  U.S.C. section 360bbb-3(b)(1), unless the authorization is  terminated or revoked. Performed at [facility], [address] [place], [place] [address]   MRSA PCR Screening     Status: None   Collection Time: 07/09/19 12:15 PM   Specimen: Nasal Mucosa; Nasopharyngeal  Result Value Ref Range Status   MRSA by PCR NEGATIVE NEGATIVE Final    Comment:        The GeneXpert MRSA Assay (FDA approved for NASAL specimens only), is one component of a comprehensive MRSA colonization surveillance program. It is not intended to diagnose MRSA infection nor to guide or monitor treatment for MRSA infections. Performed at [facility], [address] [place], [place] [address]           Radiology Studies: No results found.      Scheduled Meds: . allopurinol  300 mg Oral Daily  . amLODipine  10 mg Oral QHS  . amoxicillin  500 mg Oral Q12H  . Chlorhexidine Gluconate Cloth  6 each Topical Daily  . folic acid  1  mg Oral Daily  . furosemide  80 mg Intravenous TID  . guaiFENesin  600 mg Oral BID  . heparin  5,000 Units Subcutaneous Q8H  . insulin aspart  0-9 Units Subcutaneous TID WC  . insulin aspart protamine- aspart  4 Units Subcutaneous BID WC  . metoprolol succinate  100 mg Oral QPM  . mirabegron ER  25 mg Oral Daily  . pravastatin  80 mg Oral QPM  . sodium chloride flush  3 mL Intravenous Q12H  . tamsulosin  0.4 mg Oral QPC supper   Continuous Infusions: . sodium chloride       LOS: 4 days    Time spent: 30 minutes    Isaah Furry Darleen Crocker, DO [facility] Pager [phone]  If 7PM-7AM, please contact night-coverage [email or web address] Password [facility] 07/12/2019, 10:22 AM

## 2019-07-13 LAB — CBC
HCT: 23.5 % — ABNORMAL LOW (ref 39.0–52.0)
Hemoglobin: 7.4 g/dL — ABNORMAL LOW (ref 13.0–17.0)
MCH: 29 pg (ref 26.0–34.0)
MCHC: 31.5 g/dL (ref 30.0–36.0)
MCV: 92.2 fL (ref 80.0–100.0)
Platelets: 186 10*3/uL (ref 150–400)
RBC: 2.55 MIL/uL — ABNORMAL LOW (ref 4.22–5.81)
RDW: 18.6 % — ABNORMAL HIGH (ref 11.5–15.5)
WBC: 8.1 10*3/uL (ref 4.0–10.5)
nRBC: 0 % (ref 0.0–0.2)

## 2019-07-13 LAB — BASIC METABOLIC PANEL
Anion gap: 10 (ref 5–15)
BUN: 80 mg/dL — ABNORMAL HIGH (ref 8–23)
CO2: 29 mmol/L (ref 22–32)
Calcium: 8.8 mg/dL — ABNORMAL LOW (ref 8.9–10.3)
Chloride: 101 mmol/L (ref 98–111)
Creatinine, Ser: 3.95 mg/dL — ABNORMAL HIGH (ref 0.61–1.24)
GFR calc Af Amer: 17 mL/min — ABNORMAL LOW (ref >60)
GFR calc non Af Amer: 15 mL/min — ABNORMAL LOW (ref >60)
Glucose, Bld: 128 mg/dL — ABNORMAL HIGH (ref 70–99)
Potassium: 3.8 mmol/L (ref 3.5–5.1)
Sodium: 140 mmol/L (ref 135–145)

## 2019-07-13 LAB — GLUCOSE, CAPILLARY
Glucose-Capillary: 138 mg/dL — ABNORMAL HIGH (ref 70–99)
Glucose-Capillary: 161 mg/dL — ABNORMAL HIGH (ref 70–99)
Glucose-Capillary: 200 mg/dL — ABNORMAL HIGH (ref 70–99)
Glucose-Capillary: 216 mg/dL — ABNORMAL HIGH (ref 70–99)

## 2019-07-13 MED ORDER — POTASSIUM CHLORIDE CRYS ER 20 MEQ PO TBCR
40.0000 meq | EXTENDED_RELEASE_TABLET | Freq: Once | ORAL | Status: AC
  Administered 2019-07-13: 40 meq via ORAL
  Filled 2019-07-13: qty 2

## 2019-07-13 NOTE — Progress Notes (Signed)
PROGRESS NOTE    Danny [name]  XVQ:[id number] DOB: [birth year] DOA: 07/07/2019 PCP: Celene Squibb, MD   Brief Narrative:  64 year old male with a history of diabetes, hypertension, urinary retention with chronic Foley catheter, chronic disease stage IV,admitted to the hospital with complaints of shortness of breath. He was noted to have decompensated CHF, acute kidney injury on chronic kidney disease stage IV and acute hypoxic respiratory failure. He is being seen by nephrology for worsening renal function. Started on IV Lasix. He became more somnolent and noted to have hypercapnia. Transferred to stepdown for BiPAP.  1/14: Patient continues to remain somewhat lethargic and somnolent this morning. ABG does demonstrate some improvement and he is diuresing well with approximately 3.5 L urine output and decrease in weight. Shortness of breath appears improved.  1/15: Patient is more awake and alert this morning and okay for transfer to telemetry today. He continues to diurese quite well with a 5 L negative fluid balance documented with use of IV Lasix 3 times daily. Plan to start amoxicillinfor UTI and exchange Foley catheter today. PT evaluation.  1/16: Patient continues to have some mild improvements in creatinine levels and stability is noted.  Fluid balance is slightly -2500 mL fluid output noted.  PT recommendations for SNF on discharge.  Continues on Amoxil for UTI.  1/17: Patient continues to have significant negative fluid balance of approximately 3.4 L overnight and continues to have improvement in creatinine levels with IV Lasix.  Continue on with diuresis.  Hemoglobin remained stable.  Feraheme repeated on 1/16 by nephrology.  Assessment & Plan:   Active Problems:   Essential hypertension   Diabetes mellitus ([place])   CKD (chronic kidney disease)   Acute renal failure superimposed on stage 3 chronic kidney disease (HCC)   Congestive heart failure (HCC)   Acute on chronic  diastolic (congestive) heart failure (HCC)   Urinary retention   Controlled type 2 diabetes mellitus with stage 3 chronic kidney disease, with long-term current use of insulin ([place])   Dyslipidemia associated with type 2 diabetes mellitus (HCC)   Chronic gout due to renal impairment without tophus   Bladder outlet obstruction   AKI (acute kidney injury) ([place])   Hypoxia   Stage I pressure ulcer of right ankle   Edema of both lower extremities   1. Acute respiratory failure with hypercapnia-improvedand back to baseline. Patient refuses further BiPAP and will remain on nasal cannula oxygen for now which we will wean as tolerated.  Currently on 3 L nasal cannula. 2. Hypercapnic encephalopathy-resolved.More awake and alert and okay for transfer to telemetry at this point. 3. Enterococcal UTI in the setting of chronic Foley catheter use.  Continue amoxicillin. He appears to be asymptomatic from this. Appears to also have some candiduria related to chronic Foley use. 4. Acute kidney injury on chronic kidney disease stage IV. Felt to be related to Bactrim useas well as acute diastolic congestive heart failure.Nephrology following. Started on intravenous Lasix, now 3 times daily. Continues to have steady improvement at this time. Continue to monitor urine output carefully. 5. Acute diastolic congestive heart failure-improving. Currently on intravenous Lasix. Monitor urine output. He does have evidence of volume overload. 6. Pleural effusions. Noted on chest x-ray. Likely related to volume overload from CHF/renal disease. 7. Anemia. Likely related to chronic kidney disease. Started on Aranesp. No signs of bleeding at this time. Continue to monitor. Transfuse for hemoglobin less than 7.Currently stable,but downtrending and may require PRBC transfusion by a.m. 8. Diabetes.  Holding oral hypoglycemics. Continue on sliding scale. Blood sugar stable. 9. Hypertension. Blood pressure  stable on home dose of metoprolol.   DVT prophylaxis:Heparin Code Status:Full Family Communication:We will plan to call sister Disposition Plan:Continue diuresis per nephrology and continue amoxicillin for enterococcal UTI.  PT evaluation with recommendations for SNF on discharge.   Consultants:  Nephrology  Procedures:  None  Antimicrobials:  Anti-infectives (From admission, onward)   Start     Dose/Rate Route Frequency Ordered Stop   07/11/19 1215  amoxicillin (AMOXIL) capsule 500 mg     500 mg Oral Every 12 hours 07/11/19 1209     07/11/19 1145  nitrofurantoin (macrocrystal-monohydrate) (MACROBID) capsule 100 mg  Status:  Discontinued     100 mg Oral Every 12 hours 07/11/19 1130 07/11/19 1209       Subjective: Patient seen and evaluated today with no new acute complaints or concerns. No acute concerns or events noted overnight.  Objective: Vitals:   07/12/19 1511 07/12/19 2015 07/12/19 2128 07/13/19 0520  BP: (!) 136/58  (!) 136/54 124/62  Pulse: 66  65 62  Resp: 18  18 18   Temp: 98 F (36.7 C)  97.6 F (36.4 C) 98 F (36.7 C)  TempSrc: Oral  Oral Oral  SpO2: 98% 98% 100% 100%  Weight:    127.8 kg  Height:        Intake/Output Summary (Last 24 hours) at 07/13/2019 1206 Last data filed at 07/13/2019 0625 Gross per 24 hour  Intake 598.7 ml  Output 4052 ml  Net -3453.3 ml   Filed Weights   07/11/19 1900 07/12/19 0500 07/13/19 0520  Weight: 134.4 kg 130.9 kg 127.8 kg    Examination:  General exam: Appears calm and comfortable  Respiratory system: Clear to auscultation. Respiratory effort normal.  Currently on 3 L nasal cannula. Cardiovascular system: S1 & S2 heard, RRR. No JVD, murmurs, rubs, gallops or clicks. No pedal edema. Gastrointestinal system: Abdomen is nondistended, soft and nontender. No organomegaly or masses felt. Normal bowel sounds heard. Central nervous system: Alert and oriented. No focal neurological deficits. Extremities:  Symmetric 5 x 5 power. Skin: No rashes, lesions or ulcers Psychiatry: Judgement and insight appear normal. Mood & affect appropriate.     Data Reviewed: I have personally reviewed following labs and imaging studies  CBC: Recent Labs  Lab 07/07/19 1700 07/08/19 0442 07/09/19 0555 07/10/19 0444 07/11/19 0302 07/12/19 0654 07/13/19 0817  WBC 8.0   < > 8.7 8.8 7.9 7.8 8.1  NEUTROABS 6.4  --   --   --   --   --   --   HGB 7.8*   < > 7.3* 7.4* 7.1* 7.0* 7.4*  HCT 24.7*   < > 23.6* 23.6* 22.7* 22.1* 23.5*  MCV 90.5   < > 91.8 91.1 91.2 90.9 92.2  PLT 188   < > 169 163 163 176 186   < > = values in this interval not displayed.   Basic Metabolic Panel: Recent Labs  Lab 07/09/19 0555 07/10/19 0444 07/11/19 0302 07/12/19 0654 07/13/19 0817  NA 137 136 136 138 140  K 5.1 4.5 4.3 3.8 3.8  CL 105 104 101 101 101  CO2 23 23 26 26 29   GLUCOSE 187* 161* 164* 123* 128*  BUN 73* 75* 76* 79* 80*  CREATININE 4.68* 4.60* 4.22* 4.19* 3.95*  CALCIUM 8.6* 8.4* 8.4* 8.7* 8.8*  MG 2.3  --  2.2  --   --   PHOS 5.2* 4.9*  --   --   --  GFR: Estimated Creatinine Clearance: 25.8 mL/min (A) (by C-G formula based on SCr of 3.95 mg/dL (H)). Liver Function Tests: Recent Labs  Lab 07/07/19 1700 07/08/19 0442 07/09/19 0555 07/10/19 0444  AST 48* 37  --   --   ALT 56* 53*  --   --   ALKPHOS 106 99  --   --   BILITOT 1.1 1.0  --   --   PROT 7.2 6.8  --   --   ALBUMIN 3.2* 3.0* 3.0* 3.1*   No results for input(s): LIPASE, AMYLASE in the last 168 hours. No results for input(s): AMMONIA in the last 168 hours. Coagulation Profile: No results for input(s): INR, PROTIME in the last 168 hours. Cardiac Enzymes: No results for input(s): CKTOTAL, CKMB, CKMBINDEX, TROPONINI in the last 168 hours. BNP (last 3 results) No results for input(s): PROBNP in the last 8760 hours. HbA1C: No results for input(s): HGBA1C in the last 72 hours. CBG: Recent Labs  Lab 07/12/19 1117 07/12/19 1624  07/12/19 2127 07/13/19 0717 07/13/19 1114  GLUCAP 135* 167* 196* 138* 161*   Lipid Profile: No results for input(s): CHOL, HDL, LDLCALC, TRIG, CHOLHDL, LDLDIRECT in the last 72 hours. Thyroid Function Tests: No results for input(s): TSH, T4TOTAL, FREET4, T3FREE, THYROIDAB in the last 72 hours. Anemia Panel: No results for input(s): VITAMINB12, FOLATE, FERRITIN, TIBC, IRON, RETICCTPCT in the last 72 hours. Sepsis Labs: No results for input(s): PROCALCITON, LATICACIDVEN in the last 168 hours.  Recent Results (from the past 240 hour(s))  Culture, Urine     Status: Abnormal   Collection Time: 07/07/19  5:14 PM   Specimen: Urine, Clean Catch  Result Value Ref Range Status   Specimen Description   Final    URINE, CLEAN CATCH Performed at [facility], [address] [place], [place] [address]    Special Requests   Final    NONE Performed at [facility], [address], [place], [place] [address]    Culture (A)  Final    >=100,000 COLONIES/mL ENTEROCOCCUS FAECALIS >=100,000 COLONIES/mL YEAST    Report Status 07/10/2019 FINAL  Final   Organism ID, Bacteria ENTEROCOCCUS FAECALIS (A)  Final      Susceptibility   Enterococcus faecalis - MIC*    AMPICILLIN <=2 SENSITIVE Sensitive     NITROFURANTOIN <=16 SENSITIVE Sensitive     VANCOMYCIN 1 SENSITIVE Sensitive     * >=100,000 COLONIES/mL ENTEROCOCCUS FAECALIS  SARS CORONAVIRUS 2 (TAT 6-24 HRS) Nasopharyngeal Nasopharyngeal Swab     Status: None   Collection Time: 07/07/19  9:51 PM   Specimen: Nasopharyngeal Swab  Result Value Ref Range Status   SARS Coronavirus 2 NEGATIVE NEGATIVE Final    Comment: (NOTE) SARS-CoV-2 target nucleic acids are NOT DETECTED. The SARS-CoV-2 RNA is generally detectable in upper and lower respiratory specimens during the acute phase of infection. Negative results do not preclude SARS-CoV-2 infection, do not rule out co-infections with other pathogens, and should not be used as the sole basis for  treatment or other patient management decisions. Negative results must be combined with clinical observations, patient history, and epidemiological information. The expected result is Negative. Fact Sheet for Patients: SugarRoll.be Fact Sheet for Healthcare Providers: https://www.woods-mathews.com/ This test is not yet approved or cleared by the [place] FDA and  has been authorized for detection and/or diagnosis of SARS-CoV-2 by FDA under an Emergency Use Authorization (EUA). This EUA will remain  in effect (meaning this test can be used) for the duration of  the COVID-19 declaration under Section 56 4(b)(1) of the Act, 21 U.S.C. section 360bbb-3(b)(1), unless the authorization is terminated or revoked sooner. Performed at [facility], [place] [address] [place], [place] [address]   Respiratory Panel by RT PCR (Flu A&B, Covid) - Nasopharyngeal Swab     Status: None   Collection Time: 07/08/19 12:47 AM   Specimen: Nasopharyngeal Swab  Result Value Ref Range Status   SARS Coronavirus 2 by RT PCR NEGATIVE NEGATIVE Final    Comment: (NOTE) SARS-CoV-2 target nucleic acids are NOT DETECTED. The SARS-CoV-2 RNA is generally detectable in upper respiratoy specimens during the acute phase of infection. The lowest concentration of SARS-CoV-2 viral copies this assay can detect is 131 copies/mL. A negative result does not preclude SARS-Cov-2 infection and should not be used as the sole basis for treatment or other patient management decisions. A negative result may occur with  improper specimen collection/handling, submission of specimen other than nasopharyngeal swab, presence of viral mutation(s) within the areas targeted by this assay, and inadequate number of viral copies (<131 copies/mL). A negative result must be combined with clinical observations, patient history, and epidemiological information. The expected result is Negative. Fact  Sheet for Patients:  PinkCheek.be Fact Sheet for Healthcare Providers:  GravelBags.it This test is not yet ap proved or cleared by the [place] FDA and  has been authorized for detection and/or diagnosis of SARS-CoV-2 by FDA under an Emergency Use Authorization (EUA). This EUA will remain  in effect (meaning this test can be used) for the duration of the COVID-19 declaration under Section 564(b)(1) of the Act, 21 U.S.C. section 360bbb-3(b)(1), unless the authorization is terminated or revoked sooner.    Influenza A by PCR NEGATIVE NEGATIVE Final   Influenza B by PCR NEGATIVE NEGATIVE Final    Comment: (NOTE) The Xpert Xpress SARS-CoV-2/FLU/RSV assay is intended as an aid in  the diagnosis of influenza from Nasopharyngeal swab specimens and  should not be used as a sole basis for treatment. Nasal washings and  aspirates are unacceptable for Xpert Xpress SARS-CoV-2/FLU/RSV  testing. Fact Sheet for Patients: PinkCheek.be Fact Sheet for Healthcare Providers: GravelBags.it This test is not yet approved or cleared by the [place] FDA and  has been authorized for detection and/or diagnosis of SARS-CoV-2 by  FDA under an Emergency Use Authorization (EUA). This EUA will remain  in effect (meaning this test can be used) for the duration of the  Covid-19 declaration under Section 564(b)(1) of the Act, 21  U.S.C. section 360bbb-3(b)(1), unless the authorization is  terminated or revoked. Performed at [facility], [address] [place], [place] [address]   MRSA PCR Screening     Status: None   Collection Time: 07/09/19 12:15 PM   Specimen: Nasal Mucosa; Nasopharyngeal  Result Value Ref Range Status   MRSA by PCR NEGATIVE NEGATIVE Final    Comment:        The GeneXpert MRSA Assay (FDA approved for NASAL specimens only), is one component of a comprehensive MRSA  colonization surveillance program. It is not intended to diagnose MRSA infection nor to guide or monitor treatment for MRSA infections. Performed at [facility], [address] [place], [place] [address]          Radiology Studies: No results found.      Scheduled Meds: . allopurinol  300 mg Oral Daily  . amLODipine  10 mg Oral QHS  . amoxicillin  500 mg Oral Q12H  . Chlorhexidine Gluconate Cloth  6 each Topical Daily  . darbepoetin (ARANESP) injection - NON-DIALYSIS  150 mcg Subcutaneous Q Sat-1800  . folic acid  1 mg Oral Daily  . furosemide  80 mg Intravenous TID  . guaiFENesin  600 mg Oral BID  . heparin  5,000 Units Subcutaneous Q8H  . insulin aspart  0-9 Units Subcutaneous TID WC  . insulin aspart protamine- aspart  4 Units Subcutaneous BID WC  . metoprolol succinate  100 mg Oral QPM  . mirabegron ER  25 mg Oral Daily  . pravastatin  80 mg Oral QPM  . sodium chloride flush  3 mL Intravenous Q12H  . tamsulosin  0.4 mg Oral QPC supper   Continuous Infusions: . sodium chloride Stopped (07/12/19 1736)     LOS: 5 days    Time spent: 30 minutes    Yomira Flitton Darleen Crocker, DO [facility] Pager [phone]  If 7PM-7AM, please contact night-coverage [email or web address] Password [facility] 07/13/2019, 12:06 PM

## 2019-07-14 LAB — BASIC METABOLIC PANEL
Anion gap: 10 (ref 5–15)
BUN: 75 mg/dL — ABNORMAL HIGH (ref 8–23)
CO2: 28 mmol/L (ref 22–32)
Calcium: 8.5 mg/dL — ABNORMAL LOW (ref 8.9–10.3)
Chloride: 99 mmol/L (ref 98–111)
Creatinine, Ser: 3.92 mg/dL — ABNORMAL HIGH (ref 0.61–1.24)
GFR calc Af Amer: 17 mL/min — ABNORMAL LOW (ref >60)
GFR calc non Af Amer: 15 mL/min — ABNORMAL LOW (ref >60)
Glucose, Bld: 213 mg/dL — ABNORMAL HIGH (ref 70–99)
Potassium: 4 mmol/L (ref 3.5–5.1)
Sodium: 137 mmol/L (ref 135–145)

## 2019-07-14 LAB — GLUCOSE, CAPILLARY
Glucose-Capillary: 168 mg/dL — ABNORMAL HIGH (ref 70–99)
Glucose-Capillary: 178 mg/dL — ABNORMAL HIGH (ref 70–99)
Glucose-Capillary: 310 mg/dL — ABNORMAL HIGH (ref 70–99)
Glucose-Capillary: 199 mg/dL — ABNORMAL HIGH (ref 70–99)

## 2019-07-14 NOTE — Care Management Important Message (Signed)
Important Message  Patient Details  Name: Danny [name] MRN: [id number] Date of Birth: 22-Jan-1956   Medicare Important Message Given:  Yes     [name] 07/14/2019, 1:15 PM

## 2019-07-14 NOTE — TOC Progression Note (Signed)
Transition of Care [facility]) - Progression Note    Patient Details  Name: Danny [name] MRN: [id number] Date of Birth: [birth year]  Transition of Care [facility]) CM/SW Contact  Boneta Lucks, RN Phone Number: 07/14/2019, 4:14 PM   Bed offer from [facility], Patient is saying that is to far to go.  He wants [facility], he was there last year.  CM called Keri, patient has a $300 outstanding balance. Patient states he will call [facility] to settle his account. Also [facility] states they will not have a bed until Thursday, 07/17/19. TOC to follow to see when patient will be medically ready.   Expected Discharge Plan: Skilled Nursing Facility Barriers to Discharge: Continued Medical Work up  Expected Discharge Plan and Services Expected Discharge Plan: [place]   Readmission Risk Interventions Readmission Risk Prevention Plan 07/11/2019 03/05/2019  Transportation Screening Complete Complete  PCP or Specialist Appt within 3-5 Days - Not Complete  Not Complete comments - Going to facility and will be seen by facility medical director.  [place] or [place] - Complete  Social Work Consult for Recovery Care Planning/Counseling - Complete  Palliative Care Screening - Not Applicable  Medication Review [employer]) Complete Complete  PCP or Specialist appointment within 3-5 days of discharge Not Complete -  [place] or Home Care Consult Complete -  SW Recovery Care/Counseling Consult Complete -  Palliative Care Screening Not Complete -  Skilled Nursing Facility Complete -  Some recent data might be hidden

## 2019-07-14 NOTE — Progress Notes (Signed)
PROGRESS NOTE    Danny [name]  EHM:[id number] DOB: [birth year] DOA: 07/07/2019 PCP: Celene Squibb, MD   Brief Narrative:  64 year old male with a history of diabetes, hypertension, urinary retention with chronic Foley catheter, chronic disease stage IV,admitted to the hospital with complaints of shortness of breath. He was noted to have decompensated CHF, acute kidney injury on chronic kidney disease stage IV and acute hypoxic respiratory failure. He is being seen by nephrology for worsening renal function. Started on IV Lasix. He became more somnolent and noted to have hypercapnia. Transferred to stepdown for BiPAP.  1/14: Patient continues to remain somewhat lethargic and somnolent this morning. ABG does demonstrate some improvement and he is diuresing well with approximately 3.5 L urine output and decrease in weight. Shortness of breath appears improved.  1/15: Patient is more awake and alert this morning and okay for transfer to telemetry today. He continues to diurese quite well with a 5 L negative fluid balance documented with use of IV Lasix 3 times daily. Plan to start amoxicillinfor UTI and exchange Foley catheter today. PT evaluation.  1/16: Patient continues to have some mild improvements in creatinine levels and stability is noted. Fluid balance is slightly -2500 mL fluid output noted. PT recommendations for SNF on discharge. Continues on Amoxil for UTI.  1/17: Patient continues to have significant negative fluid balance of approximately 3.4 L overnight and continues to have improvement in creatinine levels with IV Lasix.  Continue on with diuresis.  Hemoglobin remained stable.  Feraheme repeated on 1/16 by nephrology.  1/18: Patient continues to diurese well, however outputs have not been accurately recorded.  Seen by nephrology with plans to continue Lasix diuresis for now.  Continues on 3 L nasal cannula oxygen.  Foley catheter replaced last night due to leakage around  the catheter.  Assessment & Plan:   Active Problems:   Essential hypertension   Diabetes mellitus ([place])   CKD (chronic kidney disease)   Acute renal failure superimposed on stage 3 chronic kidney disease (HCC)   Congestive heart failure (HCC)   Acute on chronic diastolic (congestive) heart failure (HCC)   Urinary retention   Controlled type 2 diabetes mellitus with stage 3 chronic kidney disease, with long-term current use of insulin ([place])   Dyslipidemia associated with type 2 diabetes mellitus (HCC)   Chronic gout due to renal impairment without tophus   Bladder outlet obstruction   AKI (acute kidney injury) ([place])   Hypoxia   Stage I pressure ulcer of right ankle   Edema of both lower extremities   1. Acute respiratory failure with hypercapnia-improvedand back to baseline. Patient refuses further BiPAP and will remain on nasal cannula oxygen for now which we will wean as tolerated.Currently on 3 L nasal cannula.  Wean as tolerated. 2. Hypercapnic encephalopathy-resolved.More awake and alert and okay for transfer to telemetry at this point. 3. Enterococcal UTI in the setting of chronic Foley catheter use.  Continue amoxicillin. He appears to be asymptomatic from this. Appears to also have some candiduria related to chronic Foley use. 4. Acute kidney injury on chronic kidney disease stage IV. Felt to be related to Bactrim useas well as acute diastolic congestive heart failure.Nephrology following. Started on intravenous Lasix, now 3 times daily. Continues to have steady improvement at this time. Continue to monitor urine outputcarefully. 5. Acute diastolic congestive heart failure-improving. Currently on intravenous Lasix. Monitor urine output. He does have evidence of volume overload. 6. Pleural effusions. Noted on chest x-ray. Likely related  to volume overload from CHF/renal disease. 7. Anemia. Likely related to chronic kidney disease. Started on Aranesp. No  signs of bleeding at this time. Continue to monitor. Transfuse for hemoglobin less than 7.Repeat CBC in a.m. 8. Diabetes. Holding oral hypoglycemics. Continue on sliding scale. Blood sugar stable. 9. Hypertension. Blood pressure stable on home dose of metoprolol.   DVT prophylaxis:Heparin Code Status:Full Family Communication:We will plan to call sister Disposition Plan:Continue diuresis per nephrologyand continue amoxicillin for enterococcal UTI. PT evaluation with recommendations for SNF on discharge.   Consultants:  Nephrology  Procedures:  None  Antimicrobials:  Anti-infectives (From admission, onward)   Start     Dose/Rate Route Frequency Ordered Stop   07/11/19 1215  amoxicillin (AMOXIL) capsule 500 mg     500 mg Oral Every 12 hours 07/11/19 1209     07/11/19 1145  nitrofurantoin (macrocrystal-monohydrate) (MACROBID) capsule 100 mg  Status:  Discontinued     100 mg Oral Every 12 hours 07/11/19 1130 07/11/19 1209       Subjective: Patient seen and evaluated today with no new acute complaints or concerns. No acute concerns or events noted overnight.  Objective: Vitals:   07/13/19 1102 07/13/19 1654 07/13/19 2100 07/14/19 0522  BP:  (!) 128/52 (!) 123/53 (!) 124/54  Pulse:  67 60 64  Resp:  16 16 18   Temp:  98.7 F (37.1 C) 98.3 F (36.8 C) 98.6 F (37 C)  TempSrc:  Oral Oral Oral  SpO2: 97% 97% 100% 99%  Weight:      Height:        Intake/Output Summary (Last 24 hours) at 07/14/2019 1217 Last data filed at 07/14/2019 0532 Gross per 24 hour  Intake --  Output 1000 ml  Net -1000 ml   Filed Weights   07/11/19 1900 07/12/19 0500 07/13/19 0520  Weight: 134.4 kg 130.9 kg 127.8 kg    Examination:  General exam: Appears calm and comfortable  Respiratory system: Clear to auscultation. Respiratory effort normal.  Currently on 3 L nasal cannula oxygen. Cardiovascular system: S1 & S2 heard, RRR. No JVD, murmurs, rubs, gallops or clicks. No  pedal edema. Gastrointestinal system: Abdomen is nondistended, soft and nontender. No organomegaly or masses felt. Normal bowel sounds heard. Central nervous system: Alert and oriented. No focal neurological deficits. Extremities: Symmetric 5 x 5 power. Skin: No rashes, lesions or ulcers Psychiatry: Judgement and insight appear normal. Mood & affect appropriate.     Data Reviewed: I have personally reviewed following labs and imaging studies  CBC: Recent Labs  Lab 07/07/19 1700 07/08/19 0442 07/09/19 0555 07/10/19 0444 07/11/19 0302 07/12/19 0654 07/13/19 0817  WBC 8.0   < > 8.7 8.8 7.9 7.8 8.1  NEUTROABS 6.4  --   --   --   --   --   --   HGB 7.8*   < > 7.3* 7.4* 7.1* 7.0* 7.4*  HCT 24.7*   < > 23.6* 23.6* 22.7* 22.1* 23.5*  MCV 90.5   < > 91.8 91.1 91.2 90.9 92.2  PLT 188   < > 169 163 163 176 186   < > = values in this interval not displayed.   Basic Metabolic Panel: Recent Labs  Lab 07/09/19 0555 07/09/19 0555 07/10/19 0444 07/11/19 0302 07/12/19 0654 07/13/19 0817 07/14/19 0638  NA 137   < > 136 136 138 140 137  K 5.1   < > 4.5 4.3 3.8 3.8 4.0  CL 105   < > 104 101  101 101 99  CO2 23   < > 23 26 26 29 28   GLUCOSE 187*   < > 161* 164* 123* 128* 213*  BUN 73*   < > 75* 76* 79* 80* 75*  CREATININE 4.68*   < > 4.60* 4.22* 4.19* 3.95* 3.92*  CALCIUM 8.6*   < > 8.4* 8.4* 8.7* 8.8* 8.5*  MG 2.3  --   --  2.2  --   --   --   PHOS 5.2*  --  4.9*  --   --   --   --    < > = values in this interval not displayed.   GFR: Estimated Creatinine Clearance: 26 mL/min (A) (by C-G formula based on SCr of 3.92 mg/dL (H)). Liver Function Tests: Recent Labs  Lab 07/07/19 1700 07/08/19 0442 07/09/19 0555 07/10/19 0444  AST 48* 37  --   --   ALT 56* 53*  --   --   ALKPHOS 106 99  --   --   BILITOT 1.1 1.0  --   --   PROT 7.2 6.8  --   --   ALBUMIN 3.2* 3.0* 3.0* 3.1*   No results for input(s): LIPASE, AMYLASE in the last 168 hours. No results for input(s): AMMONIA in  the last 168 hours. Coagulation Profile: No results for input(s): INR, PROTIME in the last 168 hours. Cardiac Enzymes: No results for input(s): CKTOTAL, CKMB, CKMBINDEX, TROPONINI in the last 168 hours. BNP (last 3 results) No results for input(s): PROBNP in the last 8760 hours. HbA1C: No results for input(s): HGBA1C in the last 72 hours. CBG: Recent Labs  Lab 07/13/19 1114 07/13/19 1628 07/13/19 2058 07/14/19 0751 07/14/19 1121  GLUCAP 161* 200* 216* 168* 178*   Lipid Profile: No results for input(s): CHOL, HDL, LDLCALC, TRIG, CHOLHDL, LDLDIRECT in the last 72 hours. Thyroid Function Tests: No results for input(s): TSH, T4TOTAL, FREET4, T3FREE, THYROIDAB in the last 72 hours. Anemia Panel: No results for input(s): VITAMINB12, FOLATE, FERRITIN, TIBC, IRON, RETICCTPCT in the last 72 hours. Sepsis Labs: No results for input(s): PROCALCITON, LATICACIDVEN in the last 168 hours.  Recent Results (from the past 240 hour(s))  Culture, Urine     Status: Abnormal   Collection Time: 07/07/19  5:14 PM   Specimen: Urine, Clean Catch  Result Value Ref Range Status   Specimen Description   Final    URINE, CLEAN CATCH Performed at [facility], [address], [place], [place] [address]    Special Requests   Final    NONE Performed at [facility], [address] [place], [place] [address]    Culture (A)  Final    >=100,000 COLONIES/mL ENTEROCOCCUS FAECALIS >=100,000 COLONIES/mL YEAST    Report Status 07/10/2019 FINAL  Final   Organism ID, Bacteria ENTEROCOCCUS FAECALIS (A)  Final      Susceptibility   Enterococcus faecalis - MIC*    AMPICILLIN <=2 SENSITIVE Sensitive     NITROFURANTOIN <=16 SENSITIVE Sensitive     VANCOMYCIN 1 SENSITIVE Sensitive     * >=100,000 COLONIES/mL ENTEROCOCCUS FAECALIS  SARS CORONAVIRUS 2 (TAT 6-24 HRS) Nasopharyngeal Nasopharyngeal Swab     Status: None   Collection Time: 07/07/19  9:51 PM   Specimen: Nasopharyngeal Swab  Result Value Ref Range  Status   SARS Coronavirus 2 NEGATIVE NEGATIVE Final    Comment: (NOTE) SARS-CoV-2 target nucleic acids are NOT DETECTED. The SARS-CoV-2 RNA is generally detectable in upper and lower respiratory specimens during  the acute phase of infection. Negative results do not preclude SARS-CoV-2 infection, do not rule out co-infections with other pathogens, and should not be used as the sole basis for treatment or other patient management decisions. Negative results must be combined with clinical observations, patient history, and epidemiological information. The expected result is Negative. Fact Sheet for Patients: SugarRoll.be Fact Sheet for Healthcare Providers: https://www.woods-mathews.com/ This test is not yet approved or cleared by the [place] FDA and  has been authorized for detection and/or diagnosis of SARS-CoV-2 by FDA under an Emergency Use Authorization (EUA). This EUA will remain  in effect (meaning this test can be used) for the duration of the COVID-19 declaration under Section 56 4(b)(1) of the Act, 21 U.S.C. section 360bbb-3(b)(1), unless the authorization is terminated or revoked sooner. Performed at [facility], [place] [address], [place], [place] [address]   Respiratory Panel by RT PCR (Flu A&B, Covid) - Nasopharyngeal Swab     Status: None   Collection Time: 07/08/19 12:47 AM   Specimen: Nasopharyngeal Swab  Result Value Ref Range Status   SARS Coronavirus 2 by RT PCR NEGATIVE NEGATIVE Final    Comment: (NOTE) SARS-CoV-2 target nucleic acids are NOT DETECTED. The SARS-CoV-2 RNA is generally detectable in upper respiratoy specimens during the acute phase of infection. The lowest concentration of SARS-CoV-2 viral copies this assay can detect is 131 copies/mL. A negative result does not preclude SARS-Cov-2 infection and should not be used as the sole basis for treatment or other patient management decisions. A negative  result may occur with  improper specimen collection/handling, submission of specimen other than nasopharyngeal swab, presence of viral mutation(s) within the areas targeted by this assay, and inadequate number of viral copies (<131 copies/mL). A negative result must be combined with clinical observations, patient history, and epidemiological information. The expected result is Negative. Fact Sheet for Patients:  PinkCheek.be Fact Sheet for Healthcare Providers:  GravelBags.it This test is not yet ap proved or cleared by the [place] FDA and  has been authorized for detection and/or diagnosis of SARS-CoV-2 by FDA under an Emergency Use Authorization (EUA). This EUA will remain  in effect (meaning this test can be used) for the duration of the COVID-19 declaration under Section 564(b)(1) of the Act, 21 U.S.C. section 360bbb-3(b)(1), unless the authorization is terminated or revoked sooner.    Influenza A by PCR NEGATIVE NEGATIVE Final   Influenza B by PCR NEGATIVE NEGATIVE Final    Comment: (NOTE) The Xpert Xpress SARS-CoV-2/FLU/RSV assay is intended as an aid in  the diagnosis of influenza from Nasopharyngeal swab specimens and  should not be used as a sole basis for treatment. Nasal washings and  aspirates are unacceptable for Xpert Xpress SARS-CoV-2/FLU/RSV  testing. Fact Sheet for Patients: PinkCheek.be Fact Sheet for Healthcare Providers: GravelBags.it This test is not yet approved or cleared by the [place] FDA and  has been authorized for detection and/or diagnosis of SARS-CoV-2 by  FDA under an Emergency Use Authorization (EUA). This EUA will remain  in effect (meaning this test can be used) for the duration of the  Covid-19 declaration under Section 564(b)(1) of the Act, 21  U.S.C. section 360bbb-3(b)(1), unless the authorization is  terminated or  revoked. Performed at [facility], [address] [place], [place] [address]   MRSA PCR Screening     Status: None   Collection Time: 07/09/19 12:15 PM   Specimen: Nasal Mucosa; Nasopharyngeal  Result Value Ref Range Status  MRSA by PCR NEGATIVE NEGATIVE Final    Comment:        The GeneXpert MRSA Assay (FDA approved for NASAL specimens only), is one component of a comprehensive MRSA colonization surveillance program. It is not intended to diagnose MRSA infection nor to guide or monitor treatment for MRSA infections. Performed at [facility], [address], [place], [place] [address]          Radiology Studies: No results found.      Scheduled Meds: . allopurinol  300 mg Oral Daily  . amLODipine  10 mg Oral QHS  . amoxicillin  500 mg Oral Q12H  . Chlorhexidine Gluconate Cloth  6 each Topical Daily  . darbepoetin (ARANESP) injection - NON-DIALYSIS  150 mcg Subcutaneous Q Sat-1800  . folic acid  1 mg Oral Daily  . furosemide  80 mg Intravenous TID  . guaiFENesin  600 mg Oral BID  . heparin  5,000 Units Subcutaneous Q8H  . insulin aspart  0-9 Units Subcutaneous TID WC  . insulin aspart protamine- aspart  4 Units Subcutaneous BID WC  . metoprolol succinate  100 mg Oral QPM  . mirabegron ER  25 mg Oral Daily  . pravastatin  80 mg Oral QPM  . sodium chloride flush  3 mL Intravenous Q12H  . tamsulosin  0.4 mg Oral QPC supper   Continuous Infusions: . sodium chloride Stopped (07/12/19 1736)     LOS: 6 days    Time spent: 30 minutes    Nuria Phebus Darleen Crocker, DO [facility] Pager [phone]  If 7PM-7AM, please contact night-coverage [email or web address] Password [facility] 07/14/2019, 12:17 PM

## 2019-07-14 NOTE — Progress Notes (Signed)
Upon entering room, patient wasn't wearing O2. Patient stated that he "didn't feel like wearing it while taking a nap". Complained of "feeling like Im in a nightmare". Patient O2 was 79%. O2 reapplied and instructed and educated to keep oxygen on. Patient verbalizes understanding. Will continue to monitor.

## 2019-07-14 NOTE — Progress Notes (Signed)
[facility] ROUNDING NOTE   Subjective:   This is a 64 year old male with a history of diabetes hypertension urine retention with a chronic Foley catheter.  He also has stage IV chronic kidney disease.  He was admitted with decompensated heart failure and acute kidney injury.  He is followed by Dr. Theador Hawthorne serum creatinine is 3.0 at baseline.  He was also on trimethoprim/sulfamethoxazole which was discontinued on admission and thought to be contributory to his acute on chronic kidney disease.  Blood pressure 124/54 pulse 64 temperature 98.6 O2 sats 9 9% 3 L nasal cannula   no urine output has been recorded on 07/12/2018 although weight had decreased to 127.8 kg.  Admission weight 140.6 kg  Sodium 137 potassium 4.0 chloride 99 CO2 28 BUN 75 creatinine 3.92 glucose 213 calcium 8.5 WBC 8.1 hemoglobin 7.4 platelets 186.  Allopurinol 300 mg daily, amlodipine 10 mg daily, amoxicillin 500 mg every 12 hours, Aranesp 150 mcg administered Saturday, 11/21/4130 folic acid 1 mg daily, insulin sliding scale, Mucinex 600 mg twice daily, insulin 70/30 4 units twice daily, metoprolol 100 mg daily, moved Patrick 25 mg daily, pravastatin 80 mg daily, Flomax 0.4 mg daily.  Lasix 80 mg IV 3 times daily since 07/10/2019-07/14/2019 Outpatient dose of diuretic was torsemide 20 mg daily   IV Feraheme administered 07/12/2019 and 07/09/2019  Last 2D echo 02/24/2019 preserved ejection fraction  Chest x-ray low volume pleural effusions atelectasis  Renal ultrasound 07/07/2018 collapsed bladder exophytic simple appearing interpolar cyst 2.4 cm diameter    Objective:  Vital signs in last 24 hours:  Temp:  [98.3 F (36.8 C)-98.7 F (37.1 C)] 98.6 F (37 C) (01/18 0522) Pulse Rate:  [60-67] 64 (01/18 0522) Resp:  [16-18] 18 (01/18 0522) BP: (123-128)/(52-54) 124/54 (01/18 0522) SpO2:  [97 %-100 %] 99 % (01/18 0522)  Weight change:  Filed Weights   07/11/19 1900 07/12/19 0500 07/13/19 0520  Weight:  134.4 kg 130.9 kg 127.8 kg    Intake/Output: I/O last 3 completed shifts: In: -  Out: 2802 [Urine:2800; Stool:2]   Intake/Output this shift:  No intake/output data recorded.  NAD, chornically ill appearing,  Obese Some abd wall edema Foley in place Regular, nl s1s2 no rub Nl mood/affect  Basic Metabolic Panel: Recent Labs  Lab 07/09/19 0555 07/09/19 0555 07/10/19 0444 07/10/19 0444 07/11/19 0302 07/11/19 0302 07/12/19 0654 07/13/19 0817 07/14/19 0638  NA 137   < > 136  --  136  --  138 140 137  K 5.1   < > 4.5  --  4.3  --  3.8 3.8 4.0  CL 105   < > 104  --  101  --  101 101 99  CO2 23   < > 23  --  26  --  26 29 28   GLUCOSE 187*   < > 161*  --  164*  --  123* 128* 213*  BUN 73*   < > 75*  --  76*  --  79* 80* 75*  CREATININE 4.68*   < > 4.60*  --  4.22*  --  4.19* 3.95* 3.92*  CALCIUM 8.6*   < > 8.4*   < > 8.4*   < > 8.7* 8.8* 8.5*  MG 2.3  --   --   --  2.2  --   --   --   --   PHOS 5.2*  --  4.9*  --   --   --   --   --   --    < > =  values in this interval not displayed.    Liver Function Tests: Recent Labs  Lab 07/07/19 1700 07/08/19 0442 07/09/19 0555 07/10/19 0444  AST 48* 37  --   --   ALT 56* 53*  --   --   ALKPHOS 106 99  --   --   BILITOT 1.1 1.0  --   --   PROT 7.2 6.8  --   --   ALBUMIN 3.2* 3.0* 3.0* 3.1*   No results for input(s): LIPASE, AMYLASE in the last 168 hours. No results for input(s): AMMONIA in the last 168 hours.  CBC: Recent Labs  Lab 07/07/19 1700 07/08/19 0442 07/09/19 0555 07/10/19 0444 07/11/19 0302 07/12/19 0654 07/13/19 0817  WBC 8.0   < > 8.7 8.8 7.9 7.8 8.1  NEUTROABS 6.4  --   --   --   --   --   --   HGB 7.8*   < > 7.3* 7.4* 7.1* 7.0* 7.4*  HCT 24.7*   < > 23.6* 23.6* 22.7* 22.1* 23.5*  MCV 90.5   < > 91.8 91.1 91.2 90.9 92.2  PLT 188   < > 169 163 163 176 186   < > = values in this interval not displayed.    Cardiac Enzymes: No results for input(s): CKTOTAL, CKMB, CKMBINDEX, TROPONINI in the last 168  hours.  BNP: Invalid input(s): POCBNP  CBG: Recent Labs  Lab 07/13/19 0717 07/13/19 1114 07/13/19 1628 07/13/19 2058 07/14/19 0751  GLUCAP 138* 161* 200* 216* 168*    Microbiology: Results for orders placed or performed during the hospital encounter of 07/07/19  Culture, Urine     Status: Abnormal   Collection Time: 07/07/19  5:14 PM   Specimen: Urine, Clean Catch  Result Value Ref Range Status   Specimen Description   Final    URINE, CLEAN CATCH Performed at [facility], [address], [place], [place] [address]    Special Requests   Final    NONE Performed at [facility], [address] [place], [place] [address]    Culture (A)  Final    >=100,000 COLONIES/mL ENTEROCOCCUS FAECALIS >=100,000 COLONIES/mL YEAST    Report Status 07/10/2019 FINAL  Final   Organism ID, Bacteria ENTEROCOCCUS FAECALIS (A)  Final      Susceptibility   Enterococcus faecalis - MIC*    AMPICILLIN <=2 SENSITIVE Sensitive     NITROFURANTOIN <=16 SENSITIVE Sensitive     VANCOMYCIN 1 SENSITIVE Sensitive     * >=100,000 COLONIES/mL ENTEROCOCCUS FAECALIS  SARS CORONAVIRUS 2 (TAT 6-24 HRS) Nasopharyngeal Nasopharyngeal Swab     Status: None   Collection Time: 07/07/19  9:51 PM   Specimen: Nasopharyngeal Swab  Result Value Ref Range Status   SARS Coronavirus 2 NEGATIVE NEGATIVE Final    Comment: (NOTE) SARS-CoV-2 target nucleic acids are NOT DETECTED. The SARS-CoV-2 RNA is generally detectable in upper and lower respiratory specimens during the acute phase of infection. Negative results do not preclude SARS-CoV-2 infection, do not rule out co-infections with other pathogens, and should not be used as the sole basis for treatment or other patient management decisions. Negative results must be combined with clinical observations, patient history, and epidemiological information. The expected result is Negative. Fact Sheet for Patients: SugarRoll.be Fact Sheet for  Healthcare Providers: https://www.woods-mathews.com/ This test is not yet approved or cleared by the [place] FDA and  has been authorized for detection and/or diagnosis of SARS-CoV-2 by FDA under an Emergency Use Authorization (EUA).  This EUA will remain  in effect (meaning this test can be used) for the duration of the COVID-19 declaration under Section 56 4(b)(1) of the Act, 21 U.S.C. section 360bbb-3(b)(1), unless the authorization is terminated or revoked sooner. Performed at [facility], [place] [address], [place], [place] [address]   Respiratory Panel by RT PCR (Flu A&B, Covid) - Nasopharyngeal Swab     Status: None   Collection Time: 07/08/19 12:47 AM   Specimen: Nasopharyngeal Swab  Result Value Ref Range Status   SARS Coronavirus 2 by RT PCR NEGATIVE NEGATIVE Final    Comment: (NOTE) SARS-CoV-2 target nucleic acids are NOT DETECTED. The SARS-CoV-2 RNA is generally detectable in upper respiratoy specimens during the acute phase of infection. The lowest concentration of SARS-CoV-2 viral copies this assay can detect is 131 copies/mL. A negative result does not preclude SARS-Cov-2 infection and should not be used as the sole basis for treatment or other patient management decisions. A negative result may occur with  improper specimen collection/handling, submission of specimen other than nasopharyngeal swab, presence of viral mutation(s) within the areas targeted by this assay, and inadequate number of viral copies (<131 copies/mL). A negative result must be combined with clinical observations, patient history, and epidemiological information. The expected result is Negative. Fact Sheet for Patients:  PinkCheek.be Fact Sheet for Healthcare Providers:  GravelBags.it This test is not yet ap proved or cleared by the [place] FDA and  has been authorized for detection and/or diagnosis of SARS-CoV-2  by FDA under an Emergency Use Authorization (EUA). This EUA will remain  in effect (meaning this test can be used) for the duration of the COVID-19 declaration under Section 564(b)(1) of the Act, 21 U.S.C. section 360bbb-3(b)(1), unless the authorization is terminated or revoked sooner.    Influenza A by PCR NEGATIVE NEGATIVE Final   Influenza B by PCR NEGATIVE NEGATIVE Final    Comment: (NOTE) The Xpert Xpress SARS-CoV-2/FLU/RSV assay is intended as an aid in  the diagnosis of influenza from Nasopharyngeal swab specimens and  should not be used as a sole basis for treatment. Nasal washings and  aspirates are unacceptable for Xpert Xpress SARS-CoV-2/FLU/RSV  testing. Fact Sheet for Patients: PinkCheek.be Fact Sheet for Healthcare Providers: GravelBags.it This test is not yet approved or cleared by the [place] FDA and  has been authorized for detection and/or diagnosis of SARS-CoV-2 by  FDA under an Emergency Use Authorization (EUA). This EUA will remain  in effect (meaning this test can be used) for the duration of the  Covid-19 declaration under Section 564(b)(1) of the Act, 21  U.S.C. section 360bbb-3(b)(1), unless the authorization is  terminated or revoked. Performed at [facility], [address] [place], [place] [address]   MRSA PCR Screening     Status: None   Collection Time: 07/09/19 12:15 PM   Specimen: Nasal Mucosa; Nasopharyngeal  Result Value Ref Range Status   MRSA by PCR NEGATIVE NEGATIVE Final    Comment:        The GeneXpert MRSA Assay (FDA approved for NASAL specimens only), is one component of a comprehensive MRSA colonization surveillance program. It is not intended to diagnose MRSA infection nor to guide or monitor treatment for MRSA infections. Performed at [facility], [address], [place], [place] [address]     Coagulation Studies: No results for input(s): LABPROT, INR in the  last 72 hours.  Urinalysis: No results for input(s): COLORURINE, LABSPEC, [place], [place], [place], [place], [place], [place], [place],  NITRITE, LEUKOCYTESUR in the last 72 hours.  Invalid input(s): APPERANCEUR    Imaging: No results found.   Medications:   . sodium chloride Stopped (07/12/19 1736)   . allopurinol  300 mg Oral Daily  . amLODipine  10 mg Oral QHS  . amoxicillin  500 mg Oral Q12H  . Chlorhexidine Gluconate Cloth  6 each Topical Daily  . darbepoetin (ARANESP) injection - NON-DIALYSIS  150 mcg Subcutaneous Q Sat-1800  . folic acid  1 mg Oral Daily  . furosemide  80 mg Intravenous TID  . guaiFENesin  600 mg Oral BID  . heparin  5,000 Units Subcutaneous Q8H  . insulin aspart  0-9 Units Subcutaneous TID WC  . insulin aspart protamine- aspart  4 Units Subcutaneous BID WC  . metoprolol succinate  100 mg Oral QPM  . mirabegron ER  25 mg Oral Daily  . pravastatin  80 mg Oral QPM  . sodium chloride flush  3 mL Intravenous Q12H  . tamsulosin  0.4 mg Oral QPC supper   sodium chloride, acetaminophen **OR** acetaminophen, albuterol, ondansetron **OR** ondansetron (ZOFRAN) IV, sodium chloride flush  Assessment/ Plan:   Stage IV chronic kidney disease.  Appears to be fairly stable acute decompensation thought to be secondary to combination of diastolic dysfunction and congestive heart failure as well as the use of trimethoprim/sulfamethoxazole as an outpatient leading to increasing creatinine.  Doubt this represents true allergic interstitial nephritis.  Anemia status post [facility] 07/09/2019.  ESA has been initiated redose of Feraheme ordered 07/12/2019 T sats 16% 07/08/2019.  Dose of ESA 150 mcg administered 07/12/2019  Hypertension/volume continue aggressive diuresis.  Patient appears to be responding very well to IV Lasix although no urine output was recorded 07/13/2019  Urinary tract infection with 100,000 CFU's pansensitive Enterococcus faecalis.  Amoxicillin  being administered every 12 hours renally adjusted.  Would imagine this is chronic colonization from indwelling Foley  Hypercapnic encephalopathy resolved  Diastolic heart failure continue aggressive diuresis volume overload  Diabetes mellitus as per primary team  Hypertension appears to be relatively stable on current dose of medications.  Will make no adjustments at this time avoiding ACE inhibitors and ARB use due to advanced nature of renal disease and volume overload.   LOS: [place] @TODAY @10 :03 AM

## 2019-07-14 NOTE — Progress Notes (Signed)
Patient is resting in bed. Patient has gotten up to bedside commode with one person assist and walker.  Patient voices no complaints other than being cold and is given one more blanket for warmth.

## 2019-07-15 LAB — BASIC METABOLIC PANEL
Anion gap: 9 (ref 5–15)
BUN: 74 mg/dL — ABNORMAL HIGH (ref 8–23)
CO2: 29 mmol/L (ref 22–32)
Calcium: 8.6 mg/dL — ABNORMAL LOW (ref 8.9–10.3)
Chloride: 99 mmol/L (ref 98–111)
Creatinine, Ser: 4.19 mg/dL — ABNORMAL HIGH (ref 0.61–1.24)
GFR calc Af Amer: 16 mL/min — ABNORMAL LOW (ref >60)
GFR calc non Af Amer: 14 mL/min — ABNORMAL LOW (ref >60)
Glucose, Bld: 151 mg/dL — ABNORMAL HIGH (ref 70–99)
Potassium: 3.8 mmol/L (ref 3.5–5.1)
Sodium: 137 mmol/L (ref 135–145)

## 2019-07-15 LAB — CBC
HCT: 24.3 % — ABNORMAL LOW (ref 39.0–52.0)
Hemoglobin: 7.6 g/dL — ABNORMAL LOW (ref 13.0–17.0)
MCH: 29.1 pg (ref 26.0–34.0)
MCHC: 31.3 g/dL (ref 30.0–36.0)
MCV: 93.1 fL (ref 80.0–100.0)
Platelets: 164 10*3/uL (ref 150–400)
RBC: 2.61 MIL/uL — ABNORMAL LOW (ref 4.22–5.81)
RDW: 17.9 % — ABNORMAL HIGH (ref 11.5–15.5)
WBC: 8.2 10*3/uL (ref 4.0–10.5)
nRBC: 0.4 % — ABNORMAL HIGH (ref 0.0–0.2)

## 2019-07-15 LAB — GLUCOSE, CAPILLARY
Glucose-Capillary: 131 mg/dL — ABNORMAL HIGH (ref 70–99)
Glucose-Capillary: 207 mg/dL — ABNORMAL HIGH (ref 70–99)

## 2019-07-15 MED ORDER — TORSEMIDE 20 MG PO TABS
40.0000 mg | ORAL_TABLET | Freq: Three times a day (TID) | ORAL | Status: DC
  Administered 2019-07-15 (×2): 40 mg via ORAL
  Filled 2019-07-15 (×2): qty 2

## 2019-07-15 MED ORDER — INSULIN ASPART PROT & ASPART (70-30 MIX) 100 UNIT/ML ~~LOC~~ SUSP
6.0000 [IU] | Freq: Two times a day (BID) | SUBCUTANEOUS | Status: DC
  Administered 2019-07-15 – 2019-07-17 (×4): 6 [IU] via SUBCUTANEOUS
  Filled 2019-07-15: qty 10

## 2019-07-15 NOTE — Progress Notes (Signed)
PROGRESS NOTE    Danny [name]  ZTI:[id number] DOB: May 03, 1956 DOA: 07/07/2019 PCP: Celene Squibb, MD   Brief Narrative:  64 year old male with a history of diabetes, hypertension, urinary retention with chronic Foley catheter, chronic disease stage IV,admitted to the hospital with complaints of shortness of breath. He was noted to have decompensated CHF, acute kidney injury on chronic kidney disease stage IV and acute hypoxic respiratory failure. He is being seen by nephrology for worsening renal function. Started on IV Lasix. He became more somnolent and noted to have hypercapnia. Transferred to stepdown for BiPAP.  1/14: Patient continues to remain somewhat lethargic and somnolent this morning. ABG does demonstrate some improvement and he is diuresing well with approximately 3.5 L urine output and decrease in weight. Shortness of breath appears improved.  1/15: Patient is more awake and alert this morning and okay for transfer to telemetry today. He continues to diurese quite well with a 5 L negative fluid balance documented with use of IV Lasix 3 times daily. Plan to start amoxicillinfor UTI and exchange Foley catheter today. PT evaluation.  1/16: Patient continues to have some mild improvements in creatinine levels and stability is noted. Fluid balance is slightly -2500 mL fluid output noted. PT recommendations for SNF on discharge. Continues on Amoxil for UTI.  1/17:Patient continues to have significant negative fluid balance of approximately 3.4 L overnight and continues to have improvement in creatinine levels with IV Lasix. Continue on with diuresis. Hemoglobin remained stable. Feraheme repeated on 1/16 by nephrology.  1/18: Patient continues to diurese well, however outputs have not been accurately recorded.  Seen by nephrology with plans to continue Lasix diuresis for now.  Continues on 3 L nasal cannula oxygen.  Foley catheter replaced last night due to leakage  around the catheter.  1/19: Patient continues to have good diuresis of approximately 2 L in the last 24 hours.  He remains on 3 L nasal cannula.  Plans are for SNF on discharge.  Nephrology has changed IV Lasix to oral Demadex dosing today and wants to evaluate urine output on this dose over the next 24 hours.  Anticipate discharge after further evaluation tomorrow if responding well.  Recheck a.m. labs.  Assessment & Plan:   Active Problems:   Essential hypertension   Diabetes mellitus ([place])   CKD (chronic kidney disease)   Acute renal failure superimposed on stage 3 chronic kidney disease (HCC)   Congestive heart failure (HCC)   Acute on chronic diastolic (congestive) heart failure (HCC)   Urinary retention   Controlled type 2 diabetes mellitus with stage 3 chronic kidney disease, with long-term current use of insulin ([place])   Dyslipidemia associated with type 2 diabetes mellitus (HCC)   Chronic gout due to renal impairment without tophus   Bladder outlet obstruction   AKI (acute kidney injury) ([place])   Hypoxia   Stage I pressure ulcer of right ankle   Edema of both lower extremities   1. Acute respiratory failure with hypercapnia-improvedand back to baseline. Patient refuses further BiPAP and will remain on nasal cannula oxygen for now which we will wean as tolerated.Currently on 3 L nasal cannula.  Wean as tolerated. 2. Hypercapnic encephalopathy-resolved.No further issues with this in the last several days. 3. Enterococcal UTI in the setting of chronic Foley catheter use.Continue amoxicillin, day 5/7.He appears to be asymptomatic from this. Appears to also have some candiduria related to chronic Foley use. 4. Acute kidney injury on chronic kidney disease stage IV. Felt to  be related to Bactrim useas well as acute diastolic congestive heart failure.Nephrology following. IV Lasix 3 times daily has been transitioned to oral Demadex by nephrology today. Continues to have  steady improvement at this time. Continue to monitor urine outputcarefully. 5. Acute diastolic congestive heart failure-resolved. Continue monitoring daily weights and fluid balance. 6. Pleural effusions. Noted on chest x-ray. Likely related to volume overload from CHF/renal disease. 7. Anemia. Likely related to chronic kidney disease. Started on Aranesp. No signs of bleeding at this time. Continue to monitor. Transfuse for hemoglobin less than 7.Repeat CBC has remained stable with no further overt bleeding. 8. Diabetes. Holding oral hypoglycemics. Continue on sliding scale. Blood sugar stable. 9. Hypertension. Blood pressure stable on home dose of metoprolol.   DVT prophylaxis:Heparin Code Status:Full Family Communication:Tried calling sister with no response, no family at bedside Disposition Plan:Continue diuresis per nephrologyand continue amoxicillin for enterococcal UTI. PT evaluation with recommendations for SNF on discharge.  Anticipate discharge in next 24 hours pending repeat nephrology evaluation to see if diuresis is adequate on oral dose of torsemide that was initiated today.   Consultants:  Nephrology  Procedures:  None  Antimicrobials:  Anti-infectives (From admission, onward)   Start     Dose/Rate Route Frequency Ordered Stop   07/11/19 1215  amoxicillin (AMOXIL) capsule 500 mg     500 mg Oral Every 12 hours 07/11/19 1209     07/11/19 1145  nitrofurantoin (macrocrystal-monohydrate) (MACROBID) capsule 100 mg  Status:  Discontinued     100 mg Oral Every 12 hours 07/11/19 1130 07/11/19 1209          Subjective: Patient seen and evaluated today with no new acute complaints or concerns. No acute concerns or events noted overnight.  He appears to be diuresing well.  Objective: Vitals:   07/14/19 2050 07/14/19 2117 07/15/19 0621 07/15/19 0754  BP:  132/60 (!) 129/59   Pulse:  66 63   Resp:  20 18   Temp:  98.3 F (36.8 C) 98.8 F  (37.1 C)   TempSrc:  Oral Oral   SpO2: 100% 99% 98% 92%  Weight:   126.3 kg   Height:        Intake/Output Summary (Last 24 hours) at 07/15/2019 1040 Last data filed at 07/15/2019 0600 Gross per 24 hour  Intake 960 ml  Output 3000 ml  Net -2040 ml   Filed Weights   07/12/19 0500 07/13/19 0520 07/15/19 0621  Weight: 130.9 kg 127.8 kg 126.3 kg    Examination:  General exam: Appears calm and comfortable, obese Respiratory system: Clear to auscultation. Respiratory effort normal.  Currently on 3 L nasal cannula oxygen. Cardiovascular system: S1 & S2 heard, RRR. No JVD, murmurs, rubs, gallops or clicks. No pedal edema. Gastrointestinal system: Abdomen is nondistended, soft and nontender. No organomegaly or masses felt. Normal bowel sounds heard. Central nervous system: Alert and oriented. No focal neurological deficits. Extremities: Symmetric 5 x 5 power. Skin: No rashes, lesions or ulcers Psychiatry: Judgement and insight appear normal. Mood & affect appropriate.  Foley with clear, yellow urine output noted    Data Reviewed: I have personally reviewed following labs and imaging studies  CBC: Recent Labs  Lab 07/10/19 0444 07/11/19 0302 07/12/19 0654 07/13/19 0817 07/15/19 0545  WBC 8.8 7.9 7.8 8.1 8.2  HGB 7.4* 7.1* 7.0* 7.4* 7.6*  HCT 23.6* 22.7* 22.1* 23.5* 24.3*  MCV 91.1 91.2 90.9 92.2 93.1  PLT 163 163 176 186 650   Basic Metabolic Panel:  Recent Labs  Lab 07/09/19 0555 07/09/19 0555 07/10/19 0444 07/10/19 0444 07/11/19 0302 07/12/19 0654 07/13/19 0817 07/14/19 0638 07/15/19 0545  NA 137   < > 136   < > 136 138 140 137 137  K 5.1   < > 4.5   < > 4.3 3.8 3.8 4.0 3.8  CL 105   < > 104   < > 101 101 101 99 99  CO2 23   < > 23   < > 26 26 29 28 29   GLUCOSE 187*   < > 161*   < > 164* 123* 128* 213* 151*  BUN 73*   < > 75*   < > 76* 79* 80* 75* 74*  CREATININE 4.68*   < > 4.60*   < > 4.22* 4.19* 3.95* 3.92* 4.19*  CALCIUM 8.6*   < > 8.4*   < > 8.4* 8.7*  8.8* 8.5* 8.6*  MG 2.3  --   --   --  2.2  --   --   --   --   PHOS 5.2*  --  4.9*  --   --   --   --   --   --    < > = values in this interval not displayed.   GFR: Estimated Creatinine Clearance: 24.1 mL/min (A) (by C-G formula based on SCr of 4.19 mg/dL (H)). Liver Function Tests: Recent Labs  Lab 07/09/19 0555 07/10/19 0444  ALBUMIN 3.0* 3.1*   No results for input(s): LIPASE, AMYLASE in the last 168 hours. No results for input(s): AMMONIA in the last 168 hours. Coagulation Profile: No results for input(s): INR, PROTIME in the last 168 hours. Cardiac Enzymes: No results for input(s): CKTOTAL, CKMB, CKMBINDEX, TROPONINI in the last 168 hours. BNP (last 3 results) No results for input(s): PROBNP in the last 8760 hours. HbA1C: No results for input(s): HGBA1C in the last 72 hours. CBG: Recent Labs  Lab 07/13/19 2058 07/14/19 0751 07/14/19 1121 07/14/19 1644 07/14/19 2136  GLUCAP 216* 168* 178* 310* 199*   Lipid Profile: No results for input(s): CHOL, HDL, LDLCALC, TRIG, CHOLHDL, LDLDIRECT in the last 72 hours. Thyroid Function Tests: No results for input(s): TSH, T4TOTAL, FREET4, T3FREE, THYROIDAB in the last 72 hours. Anemia Panel: No results for input(s): VITAMINB12, FOLATE, FERRITIN, TIBC, IRON, RETICCTPCT in the last 72 hours. Sepsis Labs: No results for input(s): PROCALCITON, LATICACIDVEN in the last 168 hours.  Recent Results (from the past 240 hour(s))  Culture, Urine     Status: Abnormal   Collection Time: 07/07/19  5:14 PM   Specimen: Urine, Clean Catch  Result Value Ref Range Status   Specimen Description   Final    URINE, CLEAN CATCH Performed at [facility], [address] [place], [place] [address]    Special Requests   Final    NONE Performed at [facility], [address] [place],  [address]    Culture (A)  Final    >=100,000 COLONIES/mL ENTEROCOCCUS FAECALIS >=100,000 COLONIES/mL YEAST    Report Status 07/10/2019 FINAL  Final    Organism ID, Bacteria ENTEROCOCCUS FAECALIS (A)  Final      Susceptibility   Enterococcus faecalis - MIC*    AMPICILLIN <=2 SENSITIVE Sensitive     NITROFURANTOIN <=16 SENSITIVE Sensitive     VANCOMYCIN 1 SENSITIVE Sensitive     * >=100,000 COLONIES/mL ENTEROCOCCUS FAECALIS  SARS CORONAVIRUS 2 (TAT 6-24 HRS) Nasopharyngeal Nasopharyngeal Swab     Status: None  Collection Time: 07/07/19  9:51 PM   Specimen: Nasopharyngeal Swab  Result Value Ref Range Status   SARS Coronavirus 2 NEGATIVE NEGATIVE Final    Comment: (NOTE) SARS-CoV-2 target nucleic acids are NOT DETECTED. The SARS-CoV-2 RNA is generally detectable in upper and lower respiratory specimens during the acute phase of infection. Negative results do not preclude SARS-CoV-2 infection, do not rule out co-infections with other pathogens, and should not be used as the sole basis for treatment or other patient management decisions. Negative results must be combined with clinical observations, patient history, and epidemiological information. The expected result is Negative. Fact Sheet for Patients: SugarRoll.be Fact Sheet for Healthcare Providers: https://www.woods-mathews.com/ This test is not yet approved or cleared by the [place] FDA and  has been authorized for detection and/or diagnosis of SARS-CoV-2 by FDA under an Emergency Use Authorization (EUA). This EUA will remain  in effect (meaning this test can be used) for the duration of the COVID-19 declaration under Section 56 4(b)(1) of the Act, 21 U.S.C. section 360bbb-3(b)(1), unless the authorization is terminated or revoked sooner. Performed at [facility], [place] [address], [place], [place] [address]   Respiratory Panel by RT PCR (Flu A&B, Covid) - Nasopharyngeal Swab     Status: None   Collection Time: 07/08/19 12:47 AM   Specimen: Nasopharyngeal Swab  Result Value Ref Range Status   SARS Coronavirus 2 by RT PCR  NEGATIVE NEGATIVE Final    Comment: (NOTE) SARS-CoV-2 target nucleic acids are NOT DETECTED. The SARS-CoV-2 RNA is generally detectable in upper respiratoy specimens during the acute phase of infection. The lowest concentration of SARS-CoV-2 viral copies this assay can detect is 131 copies/mL. A negative result does not preclude SARS-Cov-2 infection and should not be used as the sole basis for treatment or other patient management decisions. A negative result may occur with  improper specimen collection/handling, submission of specimen other than nasopharyngeal swab, presence of viral mutation(s) within the areas targeted by this assay, and inadequate number of viral copies (<131 copies/mL). A negative result must be combined with clinical observations, patient history, and epidemiological information. The expected result is Negative. Fact Sheet for Patients:  PinkCheek.be Fact Sheet for Healthcare Providers:  GravelBags.it This test is not yet ap proved or cleared by the [place] FDA and  has been authorized for detection and/or diagnosis of SARS-CoV-2 by FDA under an Emergency Use Authorization (EUA). This EUA will remain  in effect (meaning this test can be used) for the duration of the COVID-19 declaration under Section 564(b)(1) of the Act, 21 U.S.C. section 360bbb-3(b)(1), unless the authorization is terminated or revoked sooner.    Influenza A by PCR NEGATIVE NEGATIVE Final   Influenza B by PCR NEGATIVE NEGATIVE Final    Comment: (NOTE) The Xpert Xpress SARS-CoV-2/FLU/RSV assay is intended as an aid in  the diagnosis of influenza from Nasopharyngeal swab specimens and  should not be used as a sole basis for treatment. Nasal washings and  aspirates are unacceptable for Xpert Xpress SARS-CoV-2/FLU/RSV  testing. Fact Sheet for Patients: PinkCheek.be Fact Sheet for Healthcare  Providers: GravelBags.it This test is not yet approved or cleared by the [place] FDA and  has been authorized for detection and/or diagnosis of SARS-CoV-2 by  FDA under an Emergency Use Authorization (EUA). This EUA will remain  in effect (meaning this test can be used) for the duration of the  Covid-19 declaration under Section 564(b)(1) of the Act, 21  U.S.C. section 360bbb-3(b)(1), unless  the authorization is  terminated or revoked. Performed at [facility], [address], [place], [place] [address]   MRSA PCR Screening     Status: None   Collection Time: 07/09/19 12:15 PM   Specimen: Nasal Mucosa; Nasopharyngeal  Result Value Ref Range Status   MRSA by PCR NEGATIVE NEGATIVE Final    Comment:        The GeneXpert MRSA Assay (FDA approved for NASAL specimens only), is one component of a comprehensive MRSA colonization surveillance program. It is not intended to diagnose MRSA infection nor to guide or monitor treatment for MRSA infections. Performed at [facility], [address] [place], [place] [address]          Radiology Studies: No results found.      Scheduled Meds: . allopurinol  300 mg Oral Daily  . amLODipine  10 mg Oral QHS  . amoxicillin  500 mg Oral Q12H  . Chlorhexidine Gluconate Cloth  6 each Topical Daily  . darbepoetin (ARANESP) injection - NON-DIALYSIS  150 mcg Subcutaneous Q Sat-1800  . folic acid  1 mg Oral Daily  . guaiFENesin  600 mg Oral BID  . heparin  5,000 Units Subcutaneous Q8H  . insulin aspart  0-9 Units Subcutaneous TID WC  . insulin aspart protamine- aspart  6 Units Subcutaneous BID WC  . metoprolol succinate  100 mg Oral QPM  . mirabegron ER  25 mg Oral Daily  . pravastatin  80 mg Oral QPM  . sodium chloride flush  3 mL Intravenous Q12H  . tamsulosin  0.4 mg Oral QPC supper  . torsemide  40 mg Oral TID   Continuous Infusions: . sodium chloride Stopped (07/12/19 1736)     LOS: 7 days     Time spent: 30 minutes    Zahid Carneiro Darleen Crocker, DO [facility] Pager [phone]  If 7PM-7AM, please contact night-coverage [email or web address] Password [email or web address] 07/15/2019, 10:40 AM

## 2019-07-15 NOTE — Progress Notes (Signed)
Reviewed List and bed offers with patient.  [facility] [address] [place], [place] [address] [phone] Overall rating Below average 2. 0.5 mi [facility] [address] [place], [place] [address] [phone] Overall rating Much above average 3. 10.9 mi [place], [place] [address] [phone] Overall rating Above average 4. 12.8 mi [place], [place] [address] [phone] Overall rating Average 5. [place], [place] [address] [phone] Overall rating Much below average 6. 17.8 mi [place] [address] [place], [place] [address] [phone] Overall rating Above average 7. 17.9 mi [facility] [place], [place] [address] [phone] Overall rating Much below average 8. [place], [place] [address] [phone] Overall rating Much below average 9. 18.4 mi [place] [address] [place], [place] [employer] [address] [phone] Overall rating Average 10. 18.8 mi [place] [facility] [place] [address] [place], [place] [address] [phone] Overall rating Below average 11. [place], [place] [address] [phone] Overall rating Much below average 12. [address] [place] [place], [place] [address] [phone] Overall rating Much above average 13. [place], [place] [address] [phone] Overall rating Much above average 14. 20.8 mi [address] at [employer] at [facility], [place] [address] [phone] Overall rating Much above average 15. 20.9 [place] [address] [place],  [address] [phone] Overall rating Much below average

## 2019-07-15 NOTE — Progress Notes (Signed)
Inpatient Diabetes Program Recommendations  AACE/ADA: New Consensus Statement on Inpatient Glycemic Control (2015)  Target Ranges:  Prepandial:   less than 140 mg/dL      Peak postprandial:   less than 180 mg/dL (1-2 hours)      Critically ill patients:  140 - 180 mg/dL   Lab Results  Component Value Date   GLUCAP 199 (H) 07/14/2019   HGBA1C 7.0 (H) 07/07/2019    Review of Glycemic Control Results for Danny [name], Danny [name] (MRN [id number]) as of 07/15/2019 09:03  Ref. Range 07/13/2019 20:58 07/14/2019 07:51 07/14/2019 11:21 07/14/2019 16:44 07/14/2019 21:36  Glucose-Capillary Latest Ref Range: 70 - 99 mg/dL 216 (H) 168 (H) 178 (H) 310 (H) 199 (H)   Inpatient Diabetes Program Recommendations:   -Increase am 70/30 insulin to 6 units  Thank you, Bethena Roys E. Jomarie Gellis, RN, MSN, CDE  Diabetes Coordinator Inpatient Glycemic Control Team Team Pager [phone] (8am-5pm) 07/15/2019 9:05 AM

## 2019-07-15 NOTE — Progress Notes (Signed)
[facility] ROUNDING NOTE   Subjective:   This is a 64 year old male with a history of diabetes hypertension urine retention with a chronic Foley catheter.  He also has stage IV chronic kidney disease.  He was admitted with decompensated heart failure and acute kidney injury.  He is followed by Dr. Theador Hawthorne serum creatinine is 3.0 at baseline.  He was also on trimethoprim/sulfamethoxazole which was discontinued on admission and thought to be contributory to his acute on chronic kidney disease.  Blood pressure 129/59 pulse 66 temperature 98.8 O2 sats 90% 3 L nasal cannula  Sodium 137 potassium 3.8 chloride 99 CO2 29 BUN 74 creatinine 4.19 glucose 151 calcium 8.86 WBC 8.2 hemoglobin 7.6 platelets 164  Allopurinol 300 mg daily, amlodipine 10 mg daily, amoxicillin 500 mg every 12 hours, Aranesp 150 mcg administered Saturday, 6/56/8127 folic acid 1 mg daily, insulin sliding scale, Mucinex 600 mg twice daily, insulin 70/30 4 units twice daily, metoprolol 100 mg daily, moved Patrick 25 mg daily, pravastatin 80 mg daily, Flomax 0.4 mg daily.  Lasix 80 mg IV 3 times daily since 07/10/2019  Outpatient dose of diuretic was torsemide 20 mg daily   IV Feraheme administered 07/12/2019 and 07/09/2019  Last 2D echo 02/24/2019 preserved ejection fraction  Chest x-ray low volume pleural effusions atelectasis  Renal ultrasound 07/07/2018 collapsed bladder exophytic simple appearing interpolar cyst 2.4 cm diameter  Urine output 2.1 L 07/14/2019.    Objective:  Vital signs in last 24 hours:  Temp:  [98 F (36.7 C)-98.8 F (37.1 C)] 98.8 F (37.1 C) (01/19 0621) Pulse Rate:  [63-69] 63 (01/19 0621) Resp:  [18-20] 18 (01/19 0621) BP: (129-142)/(55-60) 129/59 (01/19 0621) SpO2:  [92 %-100 %] 92 % (01/19 0754) Weight:  [126.3 kg] 126.3 kg (01/19 0621)  Weight change:  Filed Weights   07/12/19 0500 07/13/19 0520 07/15/19 0621  Weight: 130.9 kg 127.8 kg 126.3 kg    Intake/Output: I/O last  3 completed shifts: In: 1200 [P.O.:1200] Out: 4000 [Urine:4000]   Intake/Output this shift:  No intake/output data recorded.  NAD, chornically ill appearing,  Obese Some abd wall edema Foley in place trace edema Regular, nl s1s2 no rub Nl mood/affect  Basic Metabolic Panel: Recent Labs  Lab 07/09/19 0555 07/09/19 0555 07/10/19 0444 07/10/19 0444 07/11/19 0302 07/11/19 0302 07/12/19 0654 07/12/19 0654 07/13/19 0817 07/14/19 0638 07/15/19 0545  NA 137   < > 136   < > 136  --  138  --  140 137 137  K 5.1   < > 4.5   < > 4.3  --  3.8  --  3.8 4.0 3.8  CL 105   < > 104   < > 101  --  101  --  101 99 99  CO2 23   < > 23   < > 26  --  26  --  29 28 29   GLUCOSE 187*   < > 161*   < > 164*  --  123*  --  128* 213* 151*  BUN 73*   < > 75*   < > 76*  --  79*  --  80* 75* 74*  CREATININE 4.68*   < > 4.60*   < > 4.22*  --  4.19*  --  3.95* 3.92* 4.19*  CALCIUM 8.6*   < > 8.4*   < > 8.4*   < > 8.7*   < > 8.8* 8.5* 8.6*  MG 2.3  --   --   --  2.2  --   --   --   --   --   --   PHOS 5.2*  --  4.9*  --   --   --   --   --   --   --   --    < > = values in this interval not displayed.    Liver Function Tests: Recent Labs  Lab 07/09/19 0555 07/10/19 0444  ALBUMIN 3.0* 3.1*   No results for input(s): LIPASE, AMYLASE in the last 168 hours. No results for input(s): AMMONIA in the last 168 hours.  CBC: Recent Labs  Lab 07/10/19 0444 07/11/19 0302 07/12/19 0654 07/13/19 0817 07/15/19 0545  WBC 8.8 7.9 7.8 8.1 8.2  HGB 7.4* 7.1* 7.0* 7.4* 7.6*  HCT 23.6* 22.7* 22.1* 23.5* 24.3*  MCV 91.1 91.2 90.9 92.2 93.1  PLT 163 163 176 186 164    Cardiac Enzymes: No results for input(s): CKTOTAL, CKMB, CKMBINDEX, TROPONINI in the last 168 hours.  BNP: Invalid input(s): POCBNP  CBG: Recent Labs  Lab 07/13/19 2058 07/14/19 0751 07/14/19 1121 07/14/19 1644 07/14/19 2136  GLUCAP 216* 168* 178* 310* 199*    Microbiology: Results for orders placed or performed during the  hospital encounter of 07/07/19  Culture, Urine     Status: Abnormal   Collection Time: 07/07/19  5:14 PM   Specimen: Urine, Clean Catch  Result Value Ref Range Status   Specimen Description   Final    URINE, CLEAN CATCH Performed at [facility], [address], [place], [place] [address]    Special Requests   Final    NONE Performed at [facility], [address], [place], [place] [address]    Culture (A)  Final    >=100,000 COLONIES/mL ENTEROCOCCUS FAECALIS >=100,000 COLONIES/mL YEAST    Report Status 07/10/2019 FINAL  Final   Organism ID, Bacteria ENTEROCOCCUS FAECALIS (A)  Final      Susceptibility   Enterococcus faecalis - MIC*    AMPICILLIN <=2 SENSITIVE Sensitive     NITROFURANTOIN <=16 SENSITIVE Sensitive     VANCOMYCIN 1 SENSITIVE Sensitive     * >=100,000 COLONIES/mL ENTEROCOCCUS FAECALIS  SARS CORONAVIRUS 2 (TAT 6-24 HRS) Nasopharyngeal Nasopharyngeal Swab     Status: None   Collection Time: 07/07/19  9:51 PM   Specimen: Nasopharyngeal Swab  Result Value Ref Range Status   SARS Coronavirus 2 NEGATIVE NEGATIVE Final    Comment: (NOTE) SARS-CoV-2 target nucleic acids are NOT DETECTED. The SARS-CoV-2 RNA is generally detectable in upper and lower respiratory specimens during the acute phase of infection. Negative results do not preclude SARS-CoV-2 infection, do not rule out co-infections with other pathogens, and should not be used as the sole basis for treatment or other patient management decisions. Negative results must be combined with clinical observations, patient history, and epidemiological information. The expected result is Negative. Fact Sheet for Patients: SugarRoll.be Fact Sheet for Healthcare Providers: https://www.woods-mathews.com/ This test is not yet approved or cleared by the [place] FDA and  has been authorized for detection and/or diagnosis of SARS-CoV-2 by FDA under an Emergency Use Authorization  (EUA). This EUA will remain  in effect (meaning this test can be used) for the duration of the COVID-19 declaration under Section 56 4(b)(1) of the Act, 21 U.S.C. section 360bbb-3(b)(1), unless the authorization is terminated or revoked sooner. Performed at [facility], [place] [address], [place], [place] [address]   Respiratory Panel by RT PCR (Flu A&B, Covid) -  Nasopharyngeal Swab     Status: None   Collection Time: 07/08/19 12:47 AM   Specimen: Nasopharyngeal Swab  Result Value Ref Range Status   SARS Coronavirus 2 by RT PCR NEGATIVE NEGATIVE Final    Comment: (NOTE) SARS-CoV-2 target nucleic acids are NOT DETECTED. The SARS-CoV-2 RNA is generally detectable in upper respiratoy specimens during the acute phase of infection. The lowest concentration of SARS-CoV-2 viral copies this assay can detect is 131 copies/mL. A negative result does not preclude SARS-Cov-2 infection and should not be used as the sole basis for treatment or other patient management decisions. A negative result may occur with  improper specimen collection/handling, submission of specimen other than nasopharyngeal swab, presence of viral mutation(s) within the areas targeted by this assay, and inadequate number of viral copies (<131 copies/mL). A negative result must be combined with clinical observations, patient history, and epidemiological information. The expected result is Negative. Fact Sheet for Patients:  PinkCheek.be Fact Sheet for Healthcare Providers:  GravelBags.it This test is not yet ap proved or cleared by the [place] FDA and  has been authorized for detection and/or diagnosis of SARS-CoV-2 by FDA under an Emergency Use Authorization (EUA). This EUA will remain  in effect (meaning this test can be used) for the duration of the COVID-19 declaration under Section 564(b)(1) of the Act, 21 U.S.C. section 360bbb-3(b)(1), unless the  authorization is terminated or revoked sooner.    Influenza A by PCR NEGATIVE NEGATIVE Final   Influenza B by PCR NEGATIVE NEGATIVE Final    Comment: (NOTE) The Xpert Xpress SARS-CoV-2/FLU/RSV assay is intended as an aid in  the diagnosis of influenza from Nasopharyngeal swab specimens and  should not be used as a sole basis for treatment. Nasal washings and  aspirates are unacceptable for Xpert Xpress SARS-CoV-2/FLU/RSV  testing. Fact Sheet for Patients: PinkCheek.be Fact Sheet for Healthcare Providers: GravelBags.it This test is not yet approved or cleared by the [place] FDA and  has been authorized for detection and/or diagnosis of SARS-CoV-2 by  FDA under an Emergency Use Authorization (EUA). This EUA will remain  in effect (meaning this test can be used) for the duration of the  Covid-19 declaration under Section 564(b)(1) of the Act, 21  U.S.C. section 360bbb-3(b)(1), unless the authorization is  terminated or revoked. Performed at [facility], [address], [place], [place] [address]   MRSA PCR Screening     Status: None   Collection Time: 07/09/19 12:15 PM   Specimen: Nasal Mucosa; Nasopharyngeal  Result Value Ref Range Status   MRSA by PCR NEGATIVE NEGATIVE Final    Comment:        The GeneXpert MRSA Assay (FDA approved for NASAL specimens only), is one component of a comprehensive MRSA colonization surveillance program. It is not intended to diagnose MRSA infection nor to guide or monitor treatment for MRSA infections. Performed at [facility], [address], [place], [place] [address]     Coagulation Studies: No results for input(s): LABPROT, INR in the last 72 hours.  Urinalysis: No results for input(s): COLORURINE, LABSPEC, PHURINE, GLUCOSEU, HGBUR, BILIRUBINUR, KETONESUR, PROTEINUR, UROBILINOGEN, NITRITE, LEUKOCYTESUR in the last 72 hours.  Invalid input(s): APPERANCEUR    Imaging: No  results found.   Medications:   . sodium chloride Stopped (07/12/19 1736)   . allopurinol  300 mg Oral Daily  . amLODipine  10 mg Oral QHS  . amoxicillin  500 mg Oral Q12H  . Chlorhexidine Gluconate Cloth  6 each Topical  Daily  . darbepoetin (ARANESP) injection - NON-DIALYSIS  150 mcg Subcutaneous Q Sat-1800  . folic acid  1 mg Oral Daily  . furosemide  80 mg Intravenous TID  . guaiFENesin  600 mg Oral BID  . heparin  5,000 Units Subcutaneous Q8H  . insulin aspart  0-9 Units Subcutaneous TID WC  . insulin aspart protamine- aspart  6 Units Subcutaneous BID WC  . metoprolol succinate  100 mg Oral QPM  . mirabegron ER  25 mg Oral Daily  . pravastatin  80 mg Oral QPM  . sodium chloride flush  3 mL Intravenous Q12H  . tamsulosin  0.4 mg Oral QPC supper   sodium chloride, acetaminophen **OR** acetaminophen, albuterol, ondansetron **OR** ondansetron (ZOFRAN) IV, sodium chloride flush  Assessment/ Plan:   Stage IV chronic kidney disease.  Appears to be fairly stable acute decompensation thought to be secondary to combination of diastolic dysfunction and congestive heart failure as well as the use of trimethoprim/sulfamethoxazole as an outpatient leading to increasing creatinine.  Doubt this represents true allergic interstitial nephritis.  He will need dialysis access planning as an outpatient.  He follows up with Dr. Theador Hawthorne.  Anemia status post [facility] 07/09/2019.  ESA has been initiated redose of Feraheme ordered 07/12/2019 T sats 16% 07/08/2019.  Dose of ESA 150 mcg administered 07/12/2019.  Will need to be set up as an outpatient for iron and Aranesp  Hypertension/volume continue aggressive diuresis.  Patient is responding well to IV Lasix I think we can transition him to oral Demadex I would like to see how he diuresis over the next 24 hours and will give him 40 mg 3 times daily.  Urinary tract infection with 100,000 CFU's pansensitive Enterococcus faecalis.  Amoxicillin being  administered every 12 hours renally adjusted.  Would imagine this is chronic colonization from indwelling Foley  Hypercapnic encephalopathy resolved  Diastolic heart failure continue aggressive diuresis volume overload  Diabetes mellitus as per primary team  Hypertension appears to be relatively stable on current dose of medications.  Will make no adjustments at this time avoiding ACE inhibitors and ARB use due to advanced nature of renal disease and volume overload.   LOS: [place] @TODAY @9 :53 AM

## 2019-07-16 DIAGNOSIS — N1832 Chronic kidney disease, stage 3b: Secondary | ICD-10-CM

## 2019-07-16 LAB — BASIC METABOLIC PANEL
Anion gap: 14 (ref 5–15)
BUN: 72 mg/dL — ABNORMAL HIGH (ref 8–23)
CO2: 29 mmol/L (ref 22–32)
Calcium: 8.9 mg/dL (ref 8.9–10.3)
Chloride: 95 mmol/L — ABNORMAL LOW (ref 98–111)
Creatinine, Ser: 4.26 mg/dL — ABNORMAL HIGH (ref 0.61–1.24)
GFR calc Af Amer: 16 mL/min — ABNORMAL LOW (ref >60)
GFR calc non Af Amer: 14 mL/min — ABNORMAL LOW (ref >60)
Glucose, Bld: 209 mg/dL — ABNORMAL HIGH (ref 70–99)
Potassium: 3.8 mmol/L (ref 3.5–5.1)
Sodium: 138 mmol/L (ref 135–145)

## 2019-07-16 LAB — GLUCOSE, CAPILLARY
Glucose-Capillary: 236 mg/dL — ABNORMAL HIGH (ref 70–99)
Glucose-Capillary: 245 mg/dL — ABNORMAL HIGH (ref 70–99)
Glucose-Capillary: 200 mg/dL — ABNORMAL HIGH (ref 70–99)
Glucose-Capillary: 204 mg/dL — ABNORMAL HIGH (ref 70–99)

## 2019-07-16 LAB — MAGNESIUM: Magnesium: 1.8 mg/dL (ref 1.7–2.4)

## 2019-07-16 MED ORDER — AMLODIPINE BESYLATE 5 MG PO TABS
5.0000 mg | ORAL_TABLET | Freq: Every day | ORAL | Status: DC
  Administered 2019-07-16: 22:00:00 5 mg via ORAL
  Filled 2019-07-16: qty 1

## 2019-07-16 MED ORDER — TORSEMIDE 20 MG PO TABS
40.0000 mg | ORAL_TABLET | Freq: Two times a day (BID) | ORAL | Status: DC
  Administered 2019-07-16 – 2019-07-17 (×3): 40 mg via ORAL
  Filled 2019-07-16 (×4): qty 2

## 2019-07-16 NOTE — Progress Notes (Signed)
Physical Therapy Treatment Patient Details Name: Danny [name] MRN: [id number] DOB: Jun 06, 1956 Today's Date: 07/16/2019    History of Present Illness Danny [name]  is a 64 y.o. male, with history of diabetes mellitus type 2, hypertension, urinary retention with chronic Foley catheter in place, CKD stage IV, chronic ulcers on foot and ankle came to hospital with complaints of shortness of breath for past 2 days.  Patient said that he has been coughing up phlegm with color ranging from white to brown.  He has history of diastolic CHF and is on torsemide at home.    PT Comments    Pt improving activity tolerance, noted by ability to add sidestepping in front of bed today.  Continues to fatigue quickly requiring frequent seated rest breaks.  O2 saturation stayed WNL, did decrease with exertion but able to return within short duration.  Pt left in chair with call bell within reach and RN aware of status.   No reports of pain, was limited by fatigue.    Follow Up Recommendations  SNF;Supervision - Intermittent     Equipment Recommendations  None recommended by PT    Recommendations for Other Services       Precautions / Restrictions Precautions Precautions: Fall Restrictions Weight Bearing Restrictions: No    Mobility  Bed Mobility Overal bed mobility: Needs Assistance Bed Mobility: Supine to Sit     Supine to sit: Min guard     General bed mobility comments: labored slow movement with frequent rest breaks and O2 desaturation on exertion.  Cueing for UE to handrails  Transfers Overall transfer level: Needs assistance Equipment used: Rolling walker (2 wheeled) Transfers: Sit to/from Stand Sit to Stand: Min assist;From elevated surface         General transfer comment: slow labored movement, cueing for hand placement  Ambulation/Gait Ambulation/Gait assistance: Min assist;Mod assist Gait Distance (Feet): 21 Feet(sidestep front of bed 2sets x 8 feet; then 62ft ambulation to  chair) Assistive device: Rolling walker (2 wheeled) Gait Pattern/deviations: Decreased step length - right;Decreased step length - left;Decreased stride length Gait velocity: slow   General Gait Details: Increased activity tolerance with ability to sidestep (2 sets x 73ft) front of bed with RW.  Has to lean over RW, unable to extend trunk due to weakness.  Decreased O2 saturaiton with exertion.  Limited by fatigue with rest breaks required.   Stairs             Wheelchair Mobility    Modified Rankin (Stroke Patients Only)       Balance                                            Cognition Arousal/Alertness: Awake/alert Behavior During Therapy: WFL for tasks assessed/performed Overall Cognitive Status: Within Functional Limits for tasks assessed                                        Exercises General Exercises - Lower Extremity Long Arc Quad: Both;10 reps;Seated Hip Flexion/Marching: Both;10 reps;Seated Toe Raises: Both;10 reps;Seated Heel Raises: Both;10 reps;Seated    General Comments        Pertinent Vitals/Pain Pain Assessment: No/denies pain    Home Living  Prior Function            PT Goals (current goals can now be found in the care plan section)      Frequency    Min 3X/week      PT Plan      Co-evaluation              AM-PAC PT "6 Clicks" Mobility   Outcome Measure  Help needed turning from your back to your side while in a flat bed without using bedrails?: A Little Help needed moving from lying on your back to sitting on the side of a flat bed without using bedrails?: A Little Help needed moving to and from a bed to a chair (including a wheelchair)?: A Little Help needed standing up from a chair using your arms (e.g., wheelchair or bedside chair)?: A Little Help needed to walk in hospital room?: A Lot Help needed climbing 3-5 steps with a railing? : A Lot 6 Click  Score: 16    End of Session Equipment Utilized During Treatment: Gait belt;Oxygen Activity Tolerance: Patient tolerated treatment well;Patient limited by fatigue Patient left: in chair;with call bell/phone within reach Nurse Communication: Mobility status PT Visit Diagnosis: Unsteadiness on feet (R26.81);Other abnormalities of gait and mobility (R26.89);Muscle weakness (generalized) (M62.81)     Time: 7673-4193 PT Time Calculation (min) (ACUTE ONLY): 29 min  Charges:  $Therapeutic Exercise: 8-22 mins $Therapeutic Activity: 8-22 mins                    [address] LPTA; CBIS [phone]   Aldona Lento 07/16/2019, 4:56 PM

## 2019-07-16 NOTE — Progress Notes (Signed)
[facility] ROUNDING NOTE   Subjective:   This is a 64 year old male with a history of diabetes hypertension/chronic Foley catheter.  He also has stage IV chronic kidney disease.  He was admitted with decompensated heart failure and acute kidney injury.  He is followed by Dr. Theador Hawthorne serum creatinine is 3.0 at baseline.  He was also on trimethoprim/sulfamethoxazole which was discontinued on admission and thought to be contributory to his acute on chronic kidney disease.   Lasix 80 mg IV 3 times daily since 07/10/2019  Outpatient dose of diuretic was torsemide 20 mg daily   IV Feraheme administered 07/12/2019 and 07/09/2019  Renal ultrasound 07/07/2018 collapsed bladder exophytic simple appearing interpolar cyst 2.4 cm diameter  UOP 2.4 liters now on torsemide 40 TID-  Has bed offers for SNF, thinks he may be going to [place]-  BUN and crt essentially stable- not uremic    Objective:  Vital signs in last 24 hours:  Temp:  [98.4 F (36.9 C)-99.3 F (37.4 C)] 99.3 F (37.4 C) (01/20 0537) Pulse Rate:  [57-63] 62 (01/20 0537) Resp:  [16-18] 18 (01/20 0537) BP: (126-132)/(53-59) 132/53 (01/20 0537) SpO2:  [92 %-100 %] 98 % (01/20 0537) Weight:  [126.9 kg] 126.9 kg (01/20 0500)  Weight change: 0.6 kg Filed Weights   07/13/19 0520 07/15/19 0621 07/16/19 0500  Weight: 127.8 kg 126.3 kg 126.9 kg    Intake/Output: I/O last 3 completed shifts: In: 720 [P.O.:720] Out: 4350 [Urine:4350]   Intake/Output this shift:  No intake/output data recorded.  NAD, chornically ill appearing,  Obese Some abd wall edema Foley in place -  Pitting edema to over knees but much improved Regular, nl s1s2 no rub Nl mood/affect  Basic Metabolic Panel: Recent Labs  Lab 07/10/19 0444 07/10/19 0444 07/11/19 0302 07/11/19 0302 07/12/19 0654 07/12/19 0654 07/13/19 0817 07/13/19 0817 07/14/19 1027 07/15/19 0545 07/16/19 0629  NA 136   < > 136   < > 138  --  140  --  137 137 138   K 4.5   < > 4.3   < > 3.8  --  3.8  --  4.0 3.8 3.8  CL 104   < > 101   < > 101  --  101  --  99 99 95*  CO2 23   < > 26   < > 26  --  29  --  28 29 29   GLUCOSE 161*   < > 164*   < > 123*  --  128*  --  213* 151* 209*  BUN 75*   < > 76*   < > 79*  --  80*  --  75* 74* 72*  CREATININE 4.60*   < > 4.22*   < > 4.19*  --  3.95*  --  3.92* 4.19* 4.26*  CALCIUM 8.4*   < > 8.4*   < > 8.7*   < > 8.8*   < > 8.5* 8.6* 8.9  MG  --   --  2.2  --   --   --   --   --   --   --  1.8  PHOS 4.9*  --   --   --   --   --   --   --   --   --   --    < > = values in this interval not displayed.    Liver Function Tests: Recent Labs  Lab 07/10/19 0444  ALBUMIN  3.1*   No results for input(s): LIPASE, AMYLASE in the last 168 hours. No results for input(s): AMMONIA in the last 168 hours.  CBC: Recent Labs  Lab 07/10/19 0444 07/11/19 0302 07/12/19 0654 07/13/19 0817 07/15/19 0545  WBC 8.8 7.9 7.8 8.1 8.2  HGB 7.4* 7.1* 7.0* 7.4* 7.6*  HCT 23.6* 22.7* 22.1* 23.5* 24.3*  MCV 91.1 91.2 90.9 92.2 93.1  PLT 163 163 176 186 164    Cardiac Enzymes: No results for input(s): CKTOTAL, CKMB, CKMBINDEX, TROPONINI in the last 168 hours.  BNP: Invalid input(s): POCBNP  CBG: Recent Labs  Lab 07/14/19 1121 07/14/19 1644 07/14/19 2136 07/15/19 0811 07/15/19 1119  GLUCAP 178* 310* 199* 131* 207*    Microbiology: Results for orders placed or performed during the hospital encounter of 07/07/19  Culture, Urine     Status: Abnormal   Collection Time: 07/07/19  5:14 PM   Specimen: Urine, Clean Catch  Result Value Ref Range Status   Specimen Description   Final    URINE, CLEAN CATCH Performed at [facility], [address] [place], [place] [address]    Special Requests   Final    NONE Performed at [facility], [address], [place], [place] [address]    Culture (A)  Final    >=100,000 COLONIES/mL ENTEROCOCCUS FAECALIS >=100,000 COLONIES/mL YEAST    Report Status 07/10/2019 FINAL  Final    Organism ID, Bacteria ENTEROCOCCUS FAECALIS (A)  Final      Susceptibility   Enterococcus faecalis - MIC*    AMPICILLIN <=2 SENSITIVE Sensitive     NITROFURANTOIN <=16 SENSITIVE Sensitive     VANCOMYCIN 1 SENSITIVE Sensitive     * >=100,000 COLONIES/mL ENTEROCOCCUS FAECALIS  SARS CORONAVIRUS 2 (TAT 6-24 HRS) Nasopharyngeal Nasopharyngeal Swab     Status: None   Collection Time: 07/07/19  9:51 PM   Specimen: Nasopharyngeal Swab  Result Value Ref Range Status   SARS Coronavirus 2 NEGATIVE NEGATIVE Final    Comment: (NOTE) SARS-CoV-2 target nucleic acids are NOT DETECTED. The SARS-CoV-2 RNA is generally detectable in upper and lower respiratory specimens during the acute phase of infection. Negative results do not preclude SARS-CoV-2 infection, do not rule out co-infections with other pathogens, and should not be used as the sole basis for treatment or other patient management decisions. Negative results must be combined with clinical observations, patient history, and epidemiological information. The expected result is Negative. Fact Sheet for Patients: SugarRoll.be Fact Sheet for Healthcare Providers: https://www.woods-mathews.com/ This test is not yet approved or cleared by the [place] FDA and  has been authorized for detection and/or diagnosis of SARS-CoV-2 by FDA under an Emergency Use Authorization (EUA). This EUA will remain  in effect (meaning this test can be used) for the duration of the COVID-19 declaration under Section 56 4(b)(1) of the Act, 21 U.S.C. section 360bbb-3(b)(1), unless the authorization is terminated or revoked sooner. Performed at [facility], [place] [address], [place], [place] [address]   Respiratory Panel by RT PCR (Flu A&B, Covid) - Nasopharyngeal Swab     Status: None   Collection Time: 07/08/19 12:47 AM   Specimen: Nasopharyngeal Swab  Result Value Ref Range Status   SARS Coronavirus 2 by RT PCR  NEGATIVE NEGATIVE Final    Comment: (NOTE) SARS-CoV-2 target nucleic acids are NOT DETECTED. The SARS-CoV-2 RNA is generally detectable in upper respiratoy specimens during the acute phase of infection. The lowest concentration of SARS-CoV-2 viral copies this assay can detect is  131 copies/mL. A negative result does not preclude SARS-Cov-2 infection and should not be used as the sole basis for treatment or other patient management decisions. A negative result may occur with  improper specimen collection/handling, submission of specimen other than nasopharyngeal swab, presence of viral mutation(s) within the areas targeted by this assay, and inadequate number of viral copies (<131 copies/mL). A negative result must be combined with clinical observations, patient history, and epidemiological information. The expected result is Negative. Fact Sheet for Patients:  PinkCheek.be Fact Sheet for Healthcare Providers:  GravelBags.it This test is not yet ap proved or cleared by the [place] FDA and  has been authorized for detection and/or diagnosis of SARS-CoV-2 by FDA under an Emergency Use Authorization (EUA). This EUA will remain  in effect (meaning this test can be used) for the duration of the COVID-19 declaration under Section 564(b)(1) of the Act, 21 U.S.C. section 360bbb-3(b)(1), unless the authorization is terminated or revoked sooner.    Influenza A by PCR NEGATIVE NEGATIVE Final   Influenza B by PCR NEGATIVE NEGATIVE Final    Comment: (NOTE) The Xpert Xpress SARS-CoV-2/FLU/RSV assay is intended as an aid in  the diagnosis of influenza from Nasopharyngeal swab specimens and  should not be used as a sole basis for treatment. Nasal washings and  aspirates are unacceptable for Xpert Xpress SARS-CoV-2/FLU/RSV  testing. Fact Sheet for Patients: PinkCheek.be Fact Sheet for Healthcare  Providers: GravelBags.it This test is not yet approved or cleared by the [place] FDA and  has been authorized for detection and/or diagnosis of SARS-CoV-2 by  FDA under an Emergency Use Authorization (EUA). This EUA will remain  in effect (meaning this test can be used) for the duration of the  Covid-19 declaration under Section 564(b)(1) of the Act, 21  U.S.C. section 360bbb-3(b)(1), unless the authorization is  terminated or revoked. Performed at [facility], [address] [place], [place] [address]   MRSA PCR Screening     Status: None   Collection Time: 07/09/19 12:15 PM   Specimen: Nasal Mucosa; Nasopharyngeal  Result Value Ref Range Status   MRSA by PCR NEGATIVE NEGATIVE Final    Comment:        The GeneXpert MRSA Assay (FDA approved for NASAL specimens only), is one component of a comprehensive MRSA colonization surveillance program. It is not intended to diagnose MRSA infection nor to guide or monitor treatment for MRSA infections. Performed at [facility], [address], [place], [place] [address]     Coagulation Studies: No results for input(s): LABPROT, INR in the last 72 hours.  Urinalysis: No results for input(s): COLORURINE, LABSPEC, PHURINE, GLUCOSEU, HGBUR, BILIRUBINUR, KETONESUR, PROTEINUR, UROBILINOGEN, NITRITE, LEUKOCYTESUR in the last 72 hours.  Invalid input(s): APPERANCEUR    Imaging: No results found.   Medications:   . sodium chloride Stopped (07/12/19 1736)   . allopurinol  300 mg Oral Daily  . amLODipine  10 mg Oral QHS  . amoxicillin  500 mg Oral Q12H  . Chlorhexidine Gluconate Cloth  6 each Topical Daily  . darbepoetin (ARANESP) injection - NON-DIALYSIS  150 mcg Subcutaneous Q Sat-1800  . folic acid  1 mg Oral Daily  . guaiFENesin  600 mg Oral BID  . heparin  5,000 Units Subcutaneous Q8H  . insulin aspart  0-9 Units Subcutaneous TID WC  . insulin aspart protamine- aspart  6 Units Subcutaneous BID  WC  . metoprolol succinate  100 mg Oral QPM  . mirabegron ER  25 mg  Oral Daily  . pravastatin  80 mg Oral QPM  . sodium chloride flush  3 mL Intravenous Q12H  . tamsulosin  0.4 mg Oral QPC supper  . torsemide  40 mg Oral TID   sodium chloride, acetaminophen **OR** acetaminophen, albuterol, ondansetron **OR** ondansetron (ZOFRAN) IV, sodium chloride flush  Assessment/ Plan:   Stage IV chronic kidney disease.  Appears to be fairly stable acute decompensation thought to be secondary to combination of diastolic dysfunction and congestive heart failure as well as the use of trimethoprim/sulfamethoxazole as an outpatient leading to increasing creatinine.  Doubt this represents true allergic interstitial nephritis.  He will need dialysis access planning as an outpatient.  He follows up with Dr. Theador Hawthorne.  Anemia status Feraheme times 2.  ESA has been initiated 150 mcg administered 07/12/2019.  Will need to be set up as an outpatient for iron and Aranesp thru [facility] ([facility])  Hypertension/volume continue aggressive diuresis.  Patient is responding well to oral Demadex - would discharge on that.  Urinary tract infection with 100,000 CFU's pansensitive Enterococcus faecalis with chronic indwelling foley.  Amoxicillin being administered every 12 hours renally adjusted.  Would imagine this is chronic colonization from indwelling Foley  Hypercapnic encephalopathy resolved  Diastolic heart failure continue aggressive diuresis volume overload  Diabetes mellitus as per primary team  Hypertension appears to be relatively stable on current dose of medications.  Will decrease amlodipine to 5 mg daily   I am OK with discharge to SNF-  Since going to [facility] can follow up with [facility]-  They cover [place] and [place].  Have changed torsemide to 40 BID   LOS: 8 Galan Ghee A Chayim Bialas @TODAY @7 :28 AM

## 2019-07-16 NOTE — TOC Progression Note (Signed)
Transition of Care [facility]) - Progression Note    Patient Details  Name: Danny [name] MRN: [id number] Date of Birth: [birth year]  [facility] CM/SW Contact  Shade Flood, LCSW Phone Number: 07/16/2019, 1:57 PM  Clinical Narrative:     TOC following. Spoke with pt to confirm dc plans. Pt reports he plans to go to [facility] at dc for short term rehab. Spoke with Claiborne Billings at [facility] to inquire on bed availability. Per Claiborne Billings, they will be able to admit pt tomorrow if he has a negative covid test result. Updated MD of need for new covid test and anticipation of bed tomorrow. TOC will follow.  Expected Discharge Plan: Skilled Nursing Facility Barriers to Discharge: Continued Medical Work up  Expected Discharge Plan and Services Expected Discharge Plan: [place]                                               Social Determinants of Health (SDOH) Interventions    Readmission Risk Interventions Readmission Risk Prevention Plan 07/11/2019 03/05/2019  Transportation Screening Complete Complete  PCP or Specialist Appt within 3-5 Days - Not Complete  Not Complete comments - Going to facility and will be seen by facility medical director.  [place] or [place] - Complete  Social Work Consult for Recovery Care Planning/Counseling - Complete  Palliative Care Screening - Not Applicable  Medication Review Press photographer) Complete Complete  PCP or Specialist appointment within 3-5 days of discharge Not Complete -  [place] or Home Care Consult Complete -  SW Recovery Care/Counseling Consult Complete -  Palliative Care Screening Not Complete -  Skilled Nursing Facility Complete -  Some recent data might be hidden

## 2019-07-16 NOTE — Progress Notes (Signed)
PROGRESS NOTE    Danny [name]  BWG:[id number] DOB: [birth year] DOA: 07/07/2019 PCP: Celene Squibb, MD   Brief Narrative:  64 year old male with a history of diabetes, hypertension, urinary retention with chronic Foley catheter, chronic disease stage IV,admitted to the hospital with complaints of shortness of breath. He was noted to have decompensated CHF, acute kidney injury on chronic kidney disease stage IV and acute hypoxic respiratory failure. He is being seen by nephrology for worsening renal function. Started on IV Lasix. He became more somnolent and noted to have hypercapnia. Transferred to stepdown for BiPAP.  Assessment & Plan:   Active Problems:   Essential hypertension   Diabetes mellitus ([place])   CKD (chronic kidney disease)   Acute renal failure superimposed on stage 3 chronic kidney disease (HCC)   Congestive heart failure (HCC)   Acute on chronic diastolic (congestive) heart failure (HCC)   Urinary retention   Controlled type 2 diabetes mellitus with stage 3 chronic kidney disease, with long-term current use of insulin ([place])   Dyslipidemia associated with type 2 diabetes mellitus (HCC)   Chronic gout due to renal impairment without tophus   Bladder outlet obstruction   AKI (acute kidney injury) ([place])   Hypoxia   Stage I pressure ulcer of right ankle   Edema of both lower extremities   1. Acute respiratory failure with hypercapnia-improvedand back to baseline. Patient refuses further BiPAP and will remain on nasal cannula oxygen for now which we will wean as tolerated.Currently on 2.5 L nasal cannula.  Wean as tolerated. 2. Hypercapnic encephalopathy-resolved.No further issues with this in the last several days. 3. Enterococcal UTI in the setting of chronic Foley catheter use.Continue amoxicillin, day 6/7.He appears to be asymptomatic from this. Appears to also have some candiduria related to chronic Foley use (most likely colonization). 4. Acute kidney  injury on chronic kidney disease stage IV. Felt to be related to Bactrim useas well as acute diastolic congestive heart failure.Nephrology following. IV Lasix 3 times daily has been transitioned to oral Demadex by nephrology today. Patient continued to have good urine output and his torsemide has been adjusted to twice a day currently. 5. Acute diastolic congestive heart failure-resolved. Continue monitoring daily weights and fluid balance.  Patient encouraged to follow low-sodium diet. 6. Pleural effusions. Noted on chest x-ray. Likely related to volume overload from CHF/renal disease.  Patient using 2.5 L nasal cannula supplementation and no complaining of orthopnea or shortness of breath currently. 7. Anemia. Likely related to chronic kidney disease. Started on Aranesp. No signs of bleeding at this time. Continue to monitor. Transfuse for hemoglobin less than 7.Repeat CBC has remained stable. 8. Diabetes. Holding oral hypoglycemics. Continue on sliding scale. Blood sugar stable. 9. Hypertension. Blood pressure stable on current antihypertensive regimen.  Continue to follow vital signs. 10. Physical deconditioning: Physical therapy has seen patient and has recommended skilled nursing facility at time of discharge.  Repeat Covid test has been ordered and will follow results with potential discharge plans on 07/17/2019.   DVT prophylaxis:Heparin Code Status:Full Family Communication:Tried calling sister with no response, no family at bedside Disposition Plan:Continue diuresis per nephrologyand continue amoxicillin for enterococcal UTI. PT evaluation with recommendations for SNF on discharge.  Anticipate discharge in next 24 hours pending repeat nephrology evaluation to see if diuresis is adequate on oral dose of torsemide that was initiated on 07/15/19.  Urine output 2.4 L.  Patient is no uremic.   Consultants:  Nephrology  Procedures:  None  Antimicrobials:  Anti-infectives (From admission, onward)   Start     Dose/Rate Route Frequency Ordered Stop   07/11/19 1215  amoxicillin (AMOXIL) capsule 500 mg     500 mg Oral Every 12 hours 07/11/19 1209     07/11/19 1145  nitrofurantoin (macrocrystal-monohydrate) (MACROBID) capsule 100 mg  Status:  Discontinued     100 mg Oral Every 12 hours 07/11/19 1130 07/11/19 1209         Subjective: No fever, no chest pain, no nausea, no vomiting.  Hemodynamically stable at this time and breathing easier.   Objective: Vitals:   07/16/19 0500 07/16/19 0537 07/16/19 1359 07/16/19 1527  BP:  (!) 132/53 118/60   Pulse:  62 61   Resp:  18 19   Temp:  99.3 F (37.4 C) 98.3 F (36.8 C)   TempSrc:  Oral Oral   SpO2:  98% 100% 98%  Weight: 126.9 kg     Height:        Intake/Output Summary (Last 24 hours) at 07/16/2019 1625 Last data filed at 07/16/2019 0900 Gross per 24 hour  Intake 480 ml  Output 2450 ml  Net -1970 ml   Filed Weights   07/13/19 0520 07/15/19 0621 07/16/19 0500  Weight: 127.8 kg 126.3 kg 126.9 kg    Examination: General exam: Alert, awake, oriented x 3; appears consequently stable.  Reports breathing is stabilizing.  No chest pain, no nausea, no vomiting. Respiratory system: Decreased breath sounds at the bases; normal respiratory effort.  Using 2.5 L nasal cannula supplementation.  Cardiovascular system:RRR. No murmurs, rubs, gallops. Gastrointestinal system: Abdomen is obese, nondistended, soft and nontender. No organomegaly or masses felt. Normal bowel sounds heard. Central nervous system: Alert and oriented. No focal neurological deficits. Extremities: No cyanosis or clubbing. Skin: No rashes, no petechiae. Psychiatry: Judgement and insight appear normal. Mood & affect appropriate.    Data Reviewed: I have personally reviewed following labs and imaging studies  CBC: Recent Labs  Lab 07/10/19 0444 07/11/19 0302 07/12/19 0654 07/13/19 0817 07/15/19 0545  WBC 8.8 7.9  7.8 8.1 8.2  HGB 7.4* 7.1* 7.0* 7.4* 7.6*  HCT 23.6* 22.7* 22.1* 23.5* 24.3*  MCV 91.1 91.2 90.9 92.2 93.1  PLT 163 163 176 186 326   Basic Metabolic Panel: Recent Labs  Lab 07/10/19 0444 07/10/19 0444 07/11/19 0302 07/11/19 0302 07/12/19 0654 07/13/19 0817 07/14/19 7124 07/15/19 0545 07/16/19 0629  NA 136   < > 136   < > 138 140 137 137 138  K 4.5   < > 4.3   < > 3.8 3.8 4.0 3.8 3.8  CL 104   < > 101   < > 101 101 99 99 95*  CO2 23   < > 26   < > 26 29 28 29 29   GLUCOSE 161*   < > 164*   < > 123* 128* 213* 151* 209*  BUN 75*   < > 76*   < > 79* 80* 75* 74* 72*  CREATININE 4.60*   < > 4.22*   < > 4.19* 3.95* 3.92* 4.19* 4.26*  CALCIUM 8.4*   < > 8.4*   < > 8.7* 8.8* 8.5* 8.6* 8.9  MG  --   --  2.2  --   --   --   --   --  1.8  PHOS 4.9*  --   --   --   --   --   --   --   --    < > =  values in this interval not displayed.   GFR: Estimated Creatinine Clearance: 23.8 mL/min (A) (by C-G formula based on SCr of 4.26 mg/dL (H)).   Liver Function Tests: Recent Labs  Lab 07/10/19 0444  ALBUMIN 3.1*   CBG: Recent Labs  Lab 07/15/19 1119 07/15/19 1654 07/15/19 2141 07/16/19 0748 07/16/19 1121  GLUCAP 207* 236* 245* 200* 204*    Recent Results (from the past 240 hour(s))  Culture, Urine     Status: Abnormal   Collection Time: 07/07/19  5:14 PM   Specimen: Urine, Clean Catch  Result Value Ref Range Status   Specimen Description   Final    URINE, CLEAN CATCH Performed at [facility], [address], [place], [place] [address]    Special Requests   Final    NONE Performed at [facility], [address] [place], [place] [address]    Culture (A)  Final    >=100,000 COLONIES/mL ENTEROCOCCUS FAECALIS >=100,000 COLONIES/mL YEAST    Report Status 07/10/2019 FINAL  Final   Organism ID, Bacteria ENTEROCOCCUS FAECALIS (A)  Final      Susceptibility   Enterococcus faecalis - MIC*    AMPICILLIN <=2 SENSITIVE Sensitive     NITROFURANTOIN <=16 SENSITIVE Sensitive      VANCOMYCIN 1 SENSITIVE Sensitive     * >=100,000 COLONIES/mL ENTEROCOCCUS FAECALIS  SARS CORONAVIRUS 2 (TAT 6-24 HRS) Nasopharyngeal Nasopharyngeal Swab     Status: None   Collection Time: 07/07/19  9:51 PM   Specimen: Nasopharyngeal Swab  Result Value Ref Range Status   SARS Coronavirus 2 NEGATIVE NEGATIVE Final    Comment: (NOTE) SARS-CoV-2 target nucleic acids are NOT DETECTED. The SARS-CoV-2 RNA is generally detectable in upper and lower respiratory specimens during the acute phase of infection. Negative results do not preclude SARS-CoV-2 infection, do not rule out co-infections with other pathogens, and should not be used as the sole basis for treatment or other patient management decisions. Negative results must be combined with clinical observations, patient history, and epidemiological information. The expected result is Negative. Fact Sheet for Patients: SugarRoll.be Fact Sheet for Healthcare Providers: https://www.woods-mathews.com/ This test is not yet approved or cleared by the [place] FDA and  has been authorized for detection and/or diagnosis of SARS-CoV-2 by FDA under an Emergency Use Authorization (EUA). This EUA will remain  in effect (meaning this test can be used) for the duration of the COVID-19 declaration under Section 56 4(b)(1) of the Act, 21 U.S.C. section 360bbb-3(b)(1), unless the authorization is terminated or revoked sooner. Performed at [facility], [place] [address] [place], [place] [address]   Respiratory Panel by RT PCR (Flu A&B, Covid) - Nasopharyngeal Swab     Status: None   Collection Time: 07/08/19 12:47 AM   Specimen: Nasopharyngeal Swab  Result Value Ref Range Status   SARS Coronavirus 2 by RT PCR NEGATIVE NEGATIVE Final    Comment: (NOTE) SARS-CoV-2 target nucleic acids are NOT DETECTED. The SARS-CoV-2 RNA is generally detectable in upper respiratoy specimens during the acute phase of  infection. The lowest concentration of SARS-CoV-2 viral copies this assay can detect is 131 copies/mL. A negative result does not preclude SARS-Cov-2 infection and should not be used as the sole basis for treatment or other patient management decisions. A negative result may occur with  improper specimen collection/handling, submission of specimen other than nasopharyngeal swab, presence of viral mutation(s) within the areas targeted by this assay, and inadequate number of viral copies (<131 copies/mL). A negative  result must be combined with clinical observations, patient history, and epidemiological information. The expected result is Negative. Fact Sheet for Patients:  PinkCheek.be Fact Sheet for Healthcare Providers:  GravelBags.it This test is not yet ap proved or cleared by the [place] FDA and  has been authorized for detection and/or diagnosis of SARS-CoV-2 by FDA under an Emergency Use Authorization (EUA). This EUA will remain  in effect (meaning this test can be used) for the duration of the COVID-19 declaration under Section 564(b)(1) of the Act, 21 U.S.C. section 360bbb-3(b)(1), unless the authorization is terminated or revoked sooner.    Influenza A by PCR NEGATIVE NEGATIVE Final   Influenza B by PCR NEGATIVE NEGATIVE Final    Comment: (NOTE) The Xpert Xpress SARS-CoV-2/FLU/RSV assay is intended as an aid in  the diagnosis of influenza from Nasopharyngeal swab specimens and  should not be used as a sole basis for treatment. Nasal washings and  aspirates are unacceptable for Xpert Xpress SARS-CoV-2/FLU/RSV  testing. Fact Sheet for Patients: PinkCheek.be Fact Sheet for Healthcare Providers: GravelBags.it This test is not yet approved or cleared by the [place] FDA and  has been authorized for detection and/or diagnosis of SARS-CoV-2 by  FDA under  an Emergency Use Authorization (EUA). This EUA will remain  in effect (meaning this test can be used) for the duration of the  Covid-19 declaration under Section 564(b)(1) of the Act, 21  U.S.C. section 360bbb-3(b)(1), unless the authorization is  terminated or revoked. Performed at [facility], [address], [place], [place] [address]   MRSA PCR Screening     Status: None   Collection Time: 07/09/19 12:15 PM   Specimen: Nasal Mucosa; Nasopharyngeal  Result Value Ref Range Status   MRSA by PCR NEGATIVE NEGATIVE Final    Comment:        The GeneXpert MRSA Assay (FDA approved for NASAL specimens only), is one component of a comprehensive MRSA colonization surveillance program. It is not intended to diagnose MRSA infection nor to guide or monitor treatment for MRSA infections. Performed at [facility], [address] [place],  [address]      Radiology Studies: No results found.   Scheduled Meds:  allopurinol  300 mg Oral Daily   amLODipine  5 mg Oral QHS   amoxicillin  500 mg Oral Q12H   Chlorhexidine Gluconate Cloth  6 each Topical Daily   darbepoetin (ARANESP) injection - NON-DIALYSIS  150 mcg Subcutaneous Q [id number]   folic acid  1 mg Oral Daily   guaiFENesin  600 mg Oral BID   heparin  5,000 Units Subcutaneous Q8H   insulin aspart  0-9 Units Subcutaneous TID WC   insulin aspart protamine- aspart  6 Units Subcutaneous BID WC   metoprolol succinate  100 mg Oral QPM   mirabegron ER  25 mg Oral Daily   pravastatin  80 mg Oral QPM   sodium chloride flush  3 mL Intravenous Q12H   tamsulosin  0.4 mg Oral QPC supper   torsemide  40 mg Oral BID   Continuous Infusions:  sodium chloride Stopped (07/12/19 1736)     LOS: 8 days    Time spent: 30 minutes   Barton Dubois, MD [facility] Pager [phone]  07/16/2019, 4:25 PM

## 2019-07-16 NOTE — Progress Notes (Signed)
Pt CBG's not importing over from glucometer.  0800 CBG 200 1200 CBG 204

## 2019-07-17 DIAGNOSIS — J9601 Acute respiratory failure with hypoxia: Secondary | ICD-10-CM | POA: Diagnosis not present

## 2019-07-17 DIAGNOSIS — E1122 Type 2 diabetes mellitus with diabetic chronic kidney disease: Secondary | ICD-10-CM | POA: Diagnosis not present

## 2019-07-17 DIAGNOSIS — N390 Urinary tract infection, site not specified: Secondary | ICD-10-CM | POA: Diagnosis not present

## 2019-07-17 DIAGNOSIS — N179 Acute kidney failure, unspecified: Secondary | ICD-10-CM | POA: Diagnosis not present

## 2019-07-17 DIAGNOSIS — M1A39X0 Chronic gout due to renal impairment, multiple sites, without tophus (tophi): Secondary | ICD-10-CM | POA: Diagnosis not present

## 2019-07-17 DIAGNOSIS — D631 Anemia in chronic kidney disease: Secondary | ICD-10-CM | POA: Diagnosis not present

## 2019-07-17 DIAGNOSIS — E1121 Type 2 diabetes mellitus with diabetic nephropathy: Secondary | ICD-10-CM | POA: Diagnosis not present

## 2019-07-17 DIAGNOSIS — R338 Other retention of urine: Secondary | ICD-10-CM | POA: Diagnosis not present

## 2019-07-17 DIAGNOSIS — I10 Essential (primary) hypertension: Secondary | ICD-10-CM | POA: Diagnosis not present

## 2019-07-17 DIAGNOSIS — E11621 Type 2 diabetes mellitus with foot ulcer: Secondary | ICD-10-CM | POA: Diagnosis not present

## 2019-07-17 DIAGNOSIS — R5381 Other malaise: Secondary | ICD-10-CM | POA: Diagnosis not present

## 2019-07-17 DIAGNOSIS — M109 Gout, unspecified: Secondary | ICD-10-CM | POA: Diagnosis not present

## 2019-07-17 DIAGNOSIS — E1159 Type 2 diabetes mellitus with other circulatory complications: Secondary | ICD-10-CM | POA: Diagnosis not present

## 2019-07-17 DIAGNOSIS — R339 Retention of urine, unspecified: Secondary | ICD-10-CM | POA: Diagnosis not present

## 2019-07-17 DIAGNOSIS — G9349 Other encephalopathy: Secondary | ICD-10-CM | POA: Diagnosis not present

## 2019-07-17 DIAGNOSIS — D649 Anemia, unspecified: Secondary | ICD-10-CM | POA: Diagnosis not present

## 2019-07-17 DIAGNOSIS — I152 Hypertension secondary to endocrine disorders: Secondary | ICD-10-CM | POA: Diagnosis not present

## 2019-07-17 DIAGNOSIS — R0902 Hypoxemia: Secondary | ICD-10-CM | POA: Diagnosis not present

## 2019-07-17 DIAGNOSIS — E1165 Type 2 diabetes mellitus with hyperglycemia: Secondary | ICD-10-CM | POA: Diagnosis not present

## 2019-07-17 DIAGNOSIS — J9602 Acute respiratory failure with hypercapnia: Secondary | ICD-10-CM | POA: Diagnosis not present

## 2019-07-17 DIAGNOSIS — I5033 Acute on chronic diastolic (congestive) heart failure: Secondary | ICD-10-CM | POA: Diagnosis not present

## 2019-07-17 DIAGNOSIS — Z7401 Bed confinement status: Secondary | ICD-10-CM | POA: Diagnosis not present

## 2019-07-17 DIAGNOSIS — N184 Chronic kidney disease, stage 4 (severe): Secondary | ICD-10-CM | POA: Diagnosis not present

## 2019-07-17 DIAGNOSIS — L89511 Pressure ulcer of right ankle, stage 1: Secondary | ICD-10-CM | POA: Diagnosis not present

## 2019-07-17 DIAGNOSIS — I509 Heart failure, unspecified: Secondary | ICD-10-CM | POA: Diagnosis not present

## 2019-07-17 DIAGNOSIS — N320 Bladder-neck obstruction: Secondary | ICD-10-CM | POA: Diagnosis not present

## 2019-07-17 DIAGNOSIS — I129 Hypertensive chronic kidney disease with stage 1 through stage 4 chronic kidney disease, or unspecified chronic kidney disease: Secondary | ICD-10-CM | POA: Diagnosis not present

## 2019-07-17 LAB — GLUCOSE, CAPILLARY
Glucose-Capillary: 221 mg/dL — ABNORMAL HIGH (ref 70–99)
Glucose-Capillary: 355 mg/dL — ABNORMAL HIGH (ref 70–99)
Glucose-Capillary: 190 mg/dL — ABNORMAL HIGH (ref 70–99)
Glucose-Capillary: 287 mg/dL — ABNORMAL HIGH (ref 70–99)

## 2019-07-17 LAB — SARS CORONAVIRUS 2 (TAT 6-24 HRS): SARS Coronavirus 2: NEGATIVE

## 2019-07-17 MED ORDER — DARBEPOETIN ALFA 150 MCG/0.3ML IJ SOSY: 150.0000 ug | PREFILLED_SYRINGE | INTRAMUSCULAR | Status: DC

## 2019-07-17 MED ORDER — GUAIFENESIN ER 600 MG PO TB12: 600.0000 mg | ORAL_TABLET | Freq: Two times a day (BID) | ORAL | Status: DC

## 2019-07-17 MED ORDER — INSULIN ASPART PROT & ASPART (70-30 MIX) 100 UNIT/ML ~~LOC~~ SUSP: 8.0000 [IU] | Freq: Three times a day (TID) | SUBCUTANEOUS | Status: DC

## 2019-07-17 MED ORDER — AMOXICILLIN 500 MG PO CAPS: 500.0000 mg | ORAL_CAPSULE | Freq: Two times a day (BID) | ORAL | Status: AC

## 2019-07-17 MED ORDER — TORSEMIDE 20 MG PO TABS: 40.0000 mg | ORAL_TABLET | Freq: Two times a day (BID) | ORAL | Status: DC

## 2019-07-17 MED ORDER — AMLODIPINE BESYLATE 5 MG PO TABS: 5.0000 mg | ORAL_TABLET | Freq: Every day | ORAL | Status: DC

## 2019-07-17 NOTE — Progress Notes (Signed)
Pt diuresed fine on torsemide 40 BID-  Renal is fine with discharge to SNF.  See my note from yesterday as I outlined that he should follow up with [facility] at discharge-  They have offices in [place] and [place]

## 2019-07-17 NOTE — Discharge Summary (Signed)
Physician Discharge Summary  Danny [name] LFY:[id number] DOB: [birth year] DOA: 07/07/2019  PCP: Celene Squibb, MD  Admit date: 07/07/2019 Discharge date: 07/17/2019  Time spent: 35 minutes  Recommendations for Outpatient Follow-up:  1. Repeat CBC in 1 week to follow hemoglobin trend 2. Repeat basic metabolic panel to follow trends and renal function    Discharge Diagnoses:  Active Problems:   Essential hypertension   Diabetes mellitus ([place])   CKD (chronic kidney disease)   Acute renal failure superimposed on stage 3 chronic kidney disease (HCC)   Congestive heart failure (HCC)   Acute on chronic diastolic (congestive) heart failure (HCC)   Urinary retention   Controlled type 2 diabetes mellitus with stage 3 chronic kidney disease, with long-term current use of insulin ([place])   Dyslipidemia associated with type 2 diabetes mellitus (HCC)   Chronic gout due to renal impairment without tophus   Bladder outlet obstruction   AKI (acute kidney injury) ([place])   Hypoxia   Stage I pressure ulcer of right ankle   Edema of both lower extremities   Discharge Condition: Stable and improved.  Patient discharged to skilled nursing facility for further care and rehabilitation.  Diet recommendation: Low sodium modified carbohydrate diet.  Filed Weights   07/15/19 0621 07/16/19 0500 07/17/19 0500  Weight: 126.3 kg 126.9 kg 127.7 kg    History of present illness:  As per H&P written by Dr. Darrick Meigs on 07/08/2019 64 y.o. male, with history of diabetes mellitus type 2, hypertension, urinary retention with chronic Foley catheter in place, CKD stage IV, chronic ulcers on foot and ankle came to hospital with complaints of shortness of breath for past 2 days.  Patient said that he has been coughing up phlegm with color ranging from white to brown.  He has history of diastolic CHF and is on torsemide at home.  In the ED, lab work showed BUN/creatinine of 69/4.57, patient's baseline creatinine is 3.10 as of  June 26, 2019.  BNP is 207.  Patient's chest x-ray shows tiny right pleural effusion and mild bibasilar atelectasis.  He is requiring 1  L of oxygen via nasal cannula, O2 sats 96%.  He was recently seen in urology office for leaking Foley catheter, at that time Foley catheter was replaced.  He has failed voiding trial x5 in the past.  He denies nausea vomiting or diarrhea. Denies chest pain. Denies abdominal pain  Hospital Course:  1. Acute respiratory failure with hypercapnia-improvedand back to baseline. Patient refuses further BiPAP and will remain on nasal cannula oxygen for now which we will wean as tolerated.Currently on 2 L nasal cannula.Continue to wean off as tolerated. 2. Hypercapnic encephalopathy-resolved.No further issues with this in the last several days. 3. Enterococcal UTI in the setting of chronic Foley catheter use.Continue amoxicillin, and complete antibiotic therapy as instructed.He appears to be asymptomatic from this. Appears to also have some candiduria related to chronic Foley use (most likely colonization). 4. Acute kidney injury on chronic kidney disease stage IV. Felt to be related to Bactrim useas well as acute diastolic congestive heart failure.Nephrology following. IV Lasix 3 times daily has been transitioned to oral Demadex by nephrology today. Patient continued to have good urine output and his torsemide has been adjusted to 40 mg twice a day currently. 5. Acute on chronic diastolic congestive heart failure-resolved. Continue monitoring daily weights and fluid balance.  Patient encouraged to follow low-sodium diet.  Continue adequate blood pressure control and diuretics therapy as recommended by nephrology service.Marland Kitchen 6.  Pleural effusions. Noted on chest x-ray. Likely related to volume overload from CHF/renal disease.  Patient using 2 L nasal cannula supplementation and no complaining of orthopnea or shortness of breath currently. 7. Anemia.  Likely related to chronic kidney disease. Started on Aranesp. No signs of bleeding at this time. Continue to monitor. Transfuse for hemoglobin less than 7.Repeat CBC in 1 week to follow hemoglobin trend. 8. Diabetes with hyperglycemia and nephropathy.  Oral hypoglycemic agent has been discontinue at this time.  Continue adjusted dose of 7030 3 times daily.  Modified carbohydrate diet encouraged.  Follow CBGs fluctuation in A1c. 9. Hypertension. Blood pressure stable on current antihypertensive regimen.  Continue to follow vital signs.  Heart healthy diet encouraged. 10. Physical deconditioning: Physical therapy has seen patient and has recommended skilled nursing facility at time of discharge.  Repeat Covid test negative.  Patient will be transfer to a skilled nursing facility for further rehabilitation and care related today 07/17/2019.  Procedures:  See below for x-ray reports.  Consultations:  Renal service   Discharge Exam: Vitals:   07/16/19 2105 07/17/19 0527  BP: (!) 137/50 (!) 137/57  Pulse: 68 61  Resp: 18 18  Temp: 98.1 F (36.7 C) 98.2 F (36.8 C)  SpO2: 100% 100%    General:  Cardiovascular:  Respiratory:   Discharge Instructions   Discharge Instructions    (HEART FAILURE PATIENTS) Call MD:  Anytime you have any of the following symptoms: 1) 3 pound weight gain in 24 hours or 5 pounds in 1 week 2) shortness of breath, with or without a dry hacking cough 3) swelling in the hands, feet or stomach 4) if you have to sleep on extra pillows at night in order to breathe.   Complete by: As directed    Diet - low sodium heart healthy   Complete by: As directed    Discharge instructions   Complete by: As directed    Continue 2 L oxygen supplementation and wean off as tolerated. Physical rehabilitation and further care as per the skilled nursing facility protocol Repeat basic metabolic panel in 1 week to follow electrolytes and renal function Close monitoring to  patient's I's and O's and daily weights. Modify carbohydrates and heart healthy diet. Outpatient follow-up with nephrology service as instructed.     Allergies as of 07/17/2019      Reactions   Dust Mite Extract Itching, Other (See Comments)   Unknown reaction-potential shortness of breath   Prednisone Nausea And Vomiting   Rocephin [ceftriaxone] Nausea And Vomiting      Medication List    STOP taking these medications   cephALEXin 500 MG capsule Commonly known as: KEFLEX   doxycycline 100 MG tablet Commonly known as: ADOXA   Droplet Insulin Syringe 31G X 5/16" 1 ML Misc Generic drug: Insulin Syringe-Needle U-100   linagliptin 5 MG Tabs tablet Commonly known as: Tradjenta   NON FORMULARY   NovoLIN 70/30 ReliOn (70-30) 100 UNIT/ML injection Generic drug: insulin NPH-regular Human Replaced by: insulin aspart protamine- aspart (70-30) 100 UNIT/ML injection   NovoLOG Mix 70/30 FlexPen (70-30) 100 UNIT/ML FlexPen Generic drug: insulin aspart protamine - aspart   sulfamethoxazole-trimethoprim 800-160 MG tablet Commonly known as: BACTRIM DS     TAKE these medications   Accu-Chek SmartView test strip Generic drug: glucose blood 1 each by Other route as needed.   acetaminophen 325 MG tablet Commonly known as: TYLENOL Take 650 mg by mouth as needed for mild pain or moderate pain.  allopurinol 300 MG tablet Commonly known as: ZYLOPRIM Take 1 tablet (300 mg total) by mouth daily. What changed: how much to take   amLODipine 5 MG tablet Commonly known as: NORVASC Take 1 tablet (5 mg total) by mouth at bedtime. What changed:   medication strength  how much to take   amoxicillin 500 MG capsule Commonly known as: AMOXIL Take 1 capsule (500 mg total) by mouth every 12 (twelve) hours for 4 days.   ascorbic acid 500 MG tablet Commonly known as: VITAMIN C Take 1,000 mg by mouth daily.   Brodalumab 210 MG/1.5ML Sosy Inject 210 mg into the skin every 14 (fourteen)  days.   Darbepoetin Alfa 150 MCG/0.3ML Sosy injection Commonly known as: ARANESP Inject 0.3 mLs (150 mcg total) into the skin every Saturday at 6 PM. Start taking on: July 19, 2019   ECZEMA MOISTURIZING EX Apply 1 application topically daily as needed (applied to legs).   feeding supplement (PRO-STAT SUGAR FREE 64) Liqd Take 30 mLs by mouth 2 (two) times daily between meals.   Fish Oil 1000 MG Caps Take by mouth.   fluticasone 50 MCG/ACT nasal spray Commonly known as: FLONASE Place 2 sprays into both nostrils daily.   folic acid 1 MG tablet Commonly known as: FOLVITE Take 1 tablet (1 mg total) by mouth daily.   guaiFENesin 600 MG 12 hr tablet Commonly known as: MUCINEX Take 1 tablet (600 mg total) by mouth 2 (two) times daily.   insulin aspart protamine- aspart (70-30) 100 UNIT/ML injection Commonly known as: NOVOLOG MIX 70/30 Inject 0.08 mLs (8 Units total) into the skin 3 (three) times daily. Replaces: NovoLIN 70/30 ReliOn (70-30) 100 UNIT/ML injection   Melatonin 1 MG Caps Take 1 mg by mouth as needed. For insomnia   metoprolol succinate 100 MG 24 hr tablet Commonly known as: TOPROL-XL Take 1 tablet (100 mg total) by mouth every evening.   mirabegron ER 25 MG Tb24 tablet Commonly known as: MYRBETRIQ Take 1 tablet (25 mg total) by mouth daily.   pravastatin 80 MG tablet Commonly known as: PRAVACHOL Take 1 tablet (80 mg total) by mouth every evening.   Santyl ointment Generic drug: collagenase Ointment; 250 unit/gram; topical  Special Instructions: Apply to wounds per treatment orders.   silver nitrate applicators 11-94 % applicator ; topical  Special Instructions: Apply to rolled borders of wound to left plantar foot daily as needed for epithelized borders. Once A Day - PRN PRN 1 What changed: Another medication with the same name was removed. Continue taking this medication, and follow the directions you see here.   tamsulosin 0.4 MG Caps  capsule Commonly known as: FLOMAX Take 1 capsule (0.4 mg total) by mouth daily after supper.   torsemide 20 MG tablet Commonly known as: DEMADEX Take 2 tablets (40 mg total) by mouth 2 (two) times daily. What changed:   how much to take  when to take this            Durable Medical Equipment  (From admission, onward)         Start     Ordered   07/17/19 0803  For home use only DME oxygen  Once    Question Answer Comment  Length of Need 6 Months   Mode or (Route) Nasal cannula   Liters per Minute 2   Frequency Continuous (stationary and portable oxygen unit needed)   Oxygen conserving device Yes   Oxygen delivery system Gas  07/17/19 0802         Allergies  Allergen Reactions  . Dust Mite Extract Itching and Other (See Comments)    Unknown reaction-potential shortness of breath  . Prednisone Nausea And Vomiting  . Rocephin [Ceftriaxone] Nausea And Vomiting    Contact information for follow-up providers    Celene Squibb, MD. Schedule an appointment as soon as possible for a visit in 2 week(s).   Specialty: Internal Medicine Why: after discharge from SNF. Contact information: [place] [address] [phone]            Contact information for after-discharge care    [facility] .   Service: Skilled Nursing Contact information: [place] [phone]                  The results of significant diagnostics from this hospitalization (including imaging, microbiology, ancillary and laboratory) are listed below for reference.    Significant Diagnostic Studies: US RENAL  Result Date: 07/08/2019 CLINICAL DATA:  Acute kidney injury. EXAM: RENAL / URINARY TRACT ULTRASOUND COMPLETE COMPARISON:  Renal ultrasound 02/24/2019 FINDINGS: Right Kidney: Renal measurements: 10.9 x 5.4 x 5.8 cm = volume: 182 mL. No hydronephrosis. Mildly increased renal  cortical echogenicity. Redemonstrated exophytic simple appearing interpolar cyst measuring 2.4 x 2.4 x 2.5 cm. Left Kidney: Renal measurements: 11.8 x 6.1 x 4.9 cm = volume: 184 mL. No hydronephrosis. Mildly increased renal cortical echogenicity. Bladder: The bladder is collapsed around a Foley catheter. Other: Small volume pelvic ascites. IMPRESSION: Increased renal cortical echogenicity bilaterally, suggestive of chronic renal parenchymal disease. 2.5 cm simple appearing right renal cyst. No hydronephrosis. Bladder collapsed around Foley catheter. Small volume pelvic ascites. Electronically Signed   By: Kellie Simmering DO   On: 07/08/2019 09:25   DG CHEST PORT 1 VIEW  Result Date: 07/09/2019 CLINICAL DATA:  Dyspnea EXAM: PORTABLE CHEST 1 VIEW COMPARISON:  07/07/2019 FINDINGS: Symmetric haziness of the lower chest attributed to pleural fluid and presumed atelectasis. Cardiomegaly. No pneumothorax IMPRESSION: 1. Low volume chest with pleural effusions and atelectasis. 2. Chronic cardiomegaly. Electronically Signed   By: Monte Fantasia M.D.   On: 07/09/2019 06:59   DG Chest Portable 1 View  Result Date: 07/07/2019 CLINICAL DATA:  Shortness of breath since last night, nonproductive cough, history CHF, hypertension, diabetes mellitus EXAM: PORTABLE CHEST 1 VIEW COMPARISON:  Portable exam 1702 hours compared to 06/26/2019 FINDINGS: Enlargement of cardiac silhouette. Mediastinal contours and pulmonary vascularity normal. Minimal RIGHT pleural effusion and bibasilar atelectasis. Remaining lungs clear. No pneumothorax or acute osseous findings. IMPRESSION: Tiny RIGHT pleural effusion and mild bibasilar atelectasis. Electronically Signed   By: Lavonia Dana M.D.   On: 07/07/2019 17:20   DG Chest Port 1 View  Result Date: 06/26/2019 CLINICAL DATA:  Shortness of breath EXAM: PORTABLE CHEST 1 VIEW COMPARISON:  02/23/2019 FINDINGS: Cardiomegaly and vascular pedicle widening. Small pleural effusions. No pulmonary edema  or air bronchogram. No pneumothorax. IMPRESSION: Cardiomegaly and small pleural effusions. Electronically Signed   By: Monte Fantasia M.D.   On: 06/26/2019 11:41    Microbiology: Recent Results (from the past 240 hour(s))  Culture, Urine     Status: Abnormal   Collection Time: 07/07/19  5:14 PM   Specimen: Urine, Clean Catch  Result Value Ref Range Status   Specimen Description   Final    URINE, CLEAN CATCH Performed at [facility], [place]  [address] [place], [name] [address]    Special Requests   Final    NONE Performed at [facility], [address] [place], [place] [address]    Culture (A)  Final    >=100,000 COLONIES/mL ENTEROCOCCUS FAECALIS >=100,000 COLONIES/mL YEAST    Report Status 07/10/2019 FINAL  Final   Organism ID, Bacteria ENTEROCOCCUS FAECALIS (A)  Final      Susceptibility   Enterococcus faecalis - MIC*    AMPICILLIN <=2 SENSITIVE Sensitive     NITROFURANTOIN <=16 SENSITIVE Sensitive     VANCOMYCIN 1 SENSITIVE Sensitive     * >=100,000 COLONIES/mL ENTEROCOCCUS FAECALIS  SARS CORONAVIRUS 2 (TAT 6-24 HRS) Nasopharyngeal Nasopharyngeal Swab     Status: None   Collection Time: 07/07/19  9:51 PM   Specimen: Nasopharyngeal Swab  Result Value Ref Range Status   SARS Coronavirus 2 NEGATIVE NEGATIVE Final    Comment: (NOTE) SARS-CoV-2 target nucleic acids are NOT DETECTED. The SARS-CoV-2 RNA is generally detectable in upper and lower respiratory specimens during the acute phase of infection. Negative results do not preclude SARS-CoV-2 infection, do not rule out co-infections with other pathogens, and should not be used as the sole basis for treatment or other patient management decisions. Negative results must be combined with clinical observations, patient history, and epidemiological information. The expected result is Negative. Fact Sheet for Patients: SugarRoll.be Fact Sheet for Healthcare  Providers: https://www.woods-mathews.com/ This test is not yet approved or cleared by the [place] FDA and  has been authorized for detection and/or diagnosis of SARS-CoV-2 by FDA under an Emergency Use Authorization (EUA). This EUA will remain  in effect (meaning this test can be used) for the duration of the COVID-19 declaration under Section 56 4(b)(1) of the Act, 21 U.S.C. section 360bbb-3(b)(1), unless the authorization is terminated or revoked sooner. Performed at [facility], [place] [address], [place], [place] [address]   Respiratory Panel by RT PCR (Flu A&B, Covid) - Nasopharyngeal Swab     Status: None   Collection Time: 07/08/19 12:47 AM   Specimen: Nasopharyngeal Swab  Result Value Ref Range Status   SARS Coronavirus 2 by RT PCR NEGATIVE NEGATIVE Final    Comment: (NOTE) SARS-CoV-2 target nucleic acids are NOT DETECTED. The SARS-CoV-2 RNA is generally detectable in upper respiratoy specimens during the acute phase of infection. The lowest concentration of SARS-CoV-2 viral copies this assay can detect is 131 copies/mL. A negative result does not preclude SARS-Cov-2 infection and should not be used as the sole basis for treatment or other patient management decisions. A negative result may occur with  improper specimen collection/handling, submission of specimen other than nasopharyngeal swab, presence of viral mutation(s) within the areas targeted by this assay, and inadequate number of viral copies (<131 copies/mL). A negative result must be combined with clinical observations, patient history, and epidemiological information. The expected result is Negative. Fact Sheet for Patients:  PinkCheek.be Fact Sheet for Healthcare Providers:  GravelBags.it This test is not yet ap proved or cleared by the [place] FDA and  has been authorized for detection and/or diagnosis of SARS-CoV-2 by FDA  under an Emergency Use Authorization (EUA). This EUA will remain  in effect (meaning this test can be used) for the duration of the COVID-19 declaration under Section 564(b)(1) of the Act, 21 U.S.C. section 360bbb-3(b)(1), unless the authorization is terminated or revoked sooner.    Influenza A by PCR NEGATIVE NEGATIVE Final   Influenza B by PCR NEGATIVE NEGATIVE Final  Comment: (NOTE) The Xpert Xpress SARS-CoV-2/FLU/RSV assay is intended as an aid in  the diagnosis of influenza from Nasopharyngeal swab specimens and  should not be used as a sole basis for treatment. Nasal washings and  aspirates are unacceptable for Xpert Xpress SARS-CoV-2/FLU/RSV  testing. Fact Sheet for Patients: PinkCheek.be Fact Sheet for Healthcare Providers: GravelBags.it This test is not yet approved or cleared by the [place] FDA and  has been authorized for detection and/or diagnosis of SARS-CoV-2 by  FDA under an Emergency Use Authorization (EUA). This EUA will remain  in effect (meaning this test can be used) for the duration of the  Covid-19 declaration under Section 564(b)(1) of the Act, 21  U.S.C. section 360bbb-3(b)(1), unless the authorization is  terminated or revoked. Performed at [facility], [address] [place], [place] [address]   MRSA PCR Screening     Status: None   Collection Time: 07/09/19 12:15 PM   Specimen: Nasal Mucosa; Nasopharyngeal  Result Value Ref Range Status   MRSA by PCR NEGATIVE NEGATIVE Final    Comment:        The GeneXpert MRSA Assay (FDA approved for NASAL specimens only), is one component of a comprehensive MRSA colonization surveillance program. It is not intended to diagnose MRSA infection nor to guide or monitor treatment for MRSA infections. Performed at [facility], [address] [place], [place] [address]   SARS CORONAVIRUS 2 (TAT 6-24 HRS) Nasopharyngeal Nasopharyngeal Swab     Status:  None   Collection Time: 07/16/19  1:46 PM   Specimen: Nasopharyngeal Swab  Result Value Ref Range Status   SARS Coronavirus 2 NEGATIVE NEGATIVE Final    Comment: (NOTE) SARS-CoV-2 target nucleic acids are NOT DETECTED. The SARS-CoV-2 RNA is generally detectable in upper and lower respiratory specimens during the acute phase of infection. Negative results do not preclude SARS-CoV-2 infection, do not rule out co-infections with other pathogens, and should not be used as the sole basis for treatment or other patient management decisions. Negative results must be combined with clinical observations, patient history, and epidemiological information. The expected result is Negative. Fact Sheet for Patients: SugarRoll.be Fact Sheet for Healthcare Providers: https://www.woods-mathews.com/ This test is not yet approved or cleared by the [place] FDA and  has been authorized for detection and/or diagnosis of SARS-CoV-2 by FDA under an Emergency Use Authorization (EUA). This EUA will remain  in effect (meaning this test can be used) for the duration of the COVID-19 declaration under Section 56 4(b)(1) of the Act, 21 U.S.C. section 360bbb-3(b)(1), unless the authorization is terminated or revoked sooner. Performed at [facility], [place] [address], [place],  [address]      Labs: Basic Metabolic Panel: Recent Labs  Lab 07/11/19 0302 07/11/19 0302 07/12/19 6301 07/13/19 6010 07/14/19 9323 07/15/19 0545 07/16/19 0629  NA 136   < > 138 140 137 137 138  K 4.3   < > 3.8 3.8 4.0 3.8 3.8  CL 101   < > 101 101 99 99 95*  CO2 26   < > 26 29 28 29 29   GLUCOSE 164*   < > 123* 128* 213* 151* 209*  BUN 76*   < > 79* 80* 75* 74* 72*  CREATININE 4.22*   < > 4.19* 3.95* 3.92* 4.19* 4.26*  CALCIUM 8.4*   < > 8.7* 8.8* 8.5* 8.6* 8.9  MG 2.2  --   --   --   --   --  1.8   < > =  values in this interval not displayed.   CBC: Recent Labs   Lab 07/11/19 0302 07/12/19 0654 07/13/19 0817 07/15/19 0545  WBC 7.9 7.8 8.1 8.2  HGB 7.1* 7.0* 7.4* 7.6*  HCT 22.7* 22.1* 23.5* 24.3*  MCV 91.2 90.9 92.2 93.1  PLT 163 176 186 164   BNP (last 3 results) Recent Labs    02/23/19 1440 06/26/19 1311 07/07/19 1700  BNP 201.0* 130.0* 207.0*   CBG: Recent Labs  Lab 07/16/19 1121 07/16/19 1627 07/16/19 2129 07/17/19 0721 07/17/19 1122  GLUCAP 204* 221* 355* 190* 287*   Signed:  Barton Dubois MD.  [facility] 07/17/2019, 12:33 PM

## 2019-07-17 NOTE — Progress Notes (Signed)
Nsg Discharge Note  Admit Date:  07/07/2019 Discharge date: 07/17/2019   Paulo Keimig to be D/C'd to inpatient rehab per MD order.  AVS completed.  Copy for chart, and copy for patient signed, and dated. Patient/caregiver able to verbalize understanding.  Discharge Medication: Allergies as of 07/17/2019      Reactions   Dust Mite Extract Itching, Other (See Comments)   Unknown reaction-potential shortness of breath   Prednisone Nausea And Vomiting   Rocephin [ceftriaxone] Nausea And Vomiting      Medication List    STOP taking these medications   cephALEXin 500 MG capsule Commonly known as: KEFLEX   doxycycline 100 MG tablet Commonly known as: ADOXA   Droplet Insulin Syringe 31G X 5/16" 1 ML Misc Generic drug: Insulin Syringe-Needle U-100   linagliptin 5 MG Tabs tablet Commonly known as: Tradjenta   NON FORMULARY   NovoLIN 70/30 ReliOn (70-30) 100 UNIT/ML injection Generic drug: insulin NPH-regular Human Replaced by: insulin aspart protamine- aspart (70-30) 100 UNIT/ML injection   NovoLOG Mix 70/30 FlexPen (70-30) 100 UNIT/ML FlexPen Generic drug: insulin aspart protamine - aspart   sulfamethoxazole-trimethoprim 800-160 MG tablet Commonly known as: BACTRIM DS     TAKE these medications   Accu-Chek SmartView test strip Generic drug: glucose blood 1 each by Other route as needed.   acetaminophen 325 MG tablet Commonly known as: TYLENOL Take 650 mg by mouth as needed for mild pain or moderate pain.   allopurinol 300 MG tablet Commonly known as: ZYLOPRIM Take 1 tablet (300 mg total) by mouth daily. What changed: how much to take   amLODipine 5 MG tablet Commonly known as: NORVASC Take 1 tablet (5 mg total) by mouth at bedtime. What changed:   medication strength  how much to take   amoxicillin 500 MG capsule Commonly known as: AMOXIL Take 1 capsule (500 mg total) by mouth every 12 (twelve) hours for 4 days.   ascorbic acid 500 MG tablet Commonly known  as: VITAMIN C Take 1,000 mg by mouth daily.   Brodalumab 210 MG/1.5ML Sosy Inject 210 mg into the skin every 14 (fourteen) days.   Darbepoetin Alfa 150 MCG/0.3ML Sosy injection Commonly known as: ARANESP Inject 0.3 mLs (150 mcg total) into the skin every Saturday at 6 PM. Start taking on: July 19, 2019   ECZEMA MOISTURIZING EX Apply 1 application topically daily as needed (applied to legs).   feeding supplement (PRO-STAT SUGAR FREE 64) Liqd Take 30 mLs by mouth 2 (two) times daily between meals.   Fish Oil 1000 MG Caps Take by mouth.   fluticasone 50 MCG/ACT nasal spray Commonly known as: FLONASE Place 2 sprays into both nostrils daily.   folic acid 1 MG tablet Commonly known as: FOLVITE Take 1 tablet (1 mg total) by mouth daily.   guaiFENesin 600 MG 12 hr tablet Commonly known as: MUCINEX Take 1 tablet (600 mg total) by mouth 2 (two) times daily.   insulin aspart protamine- aspart (70-30) 100 UNIT/ML injection Commonly known as: NOVOLOG MIX 70/30 Inject 0.08 mLs (8 Units total) into the skin 3 (three) times daily. Replaces: NovoLIN 70/30 ReliOn (70-30) 100 UNIT/ML injection   Melatonin 1 MG Caps Take 1 mg by mouth as needed. For insomnia   metoprolol succinate 100 MG 24 hr tablet Commonly known as: TOPROL-XL Take 1 tablet (100 mg total) by mouth every evening.   mirabegron ER 25 MG Tb24 tablet Commonly known as: MYRBETRIQ Take 1 tablet (25 mg total) by mouth daily.  pravastatin 80 MG tablet Commonly known as: PRAVACHOL Take 1 tablet (80 mg total) by mouth every evening.   Santyl ointment Generic drug: collagenase Ointment; 250 unit/gram; topical  Special Instructions: Apply to wounds per treatment orders.   silver nitrate applicators 23-53 % applicator ; topical  Special Instructions: Apply to rolled borders of wound to left plantar foot daily as needed for epithelized borders. Once A Day - PRN PRN 1 What changed: Another medication with the same  name was removed. Continue taking this medication, and follow the directions you see here.   tamsulosin 0.4 MG Caps capsule Commonly known as: FLOMAX Take 1 capsule (0.4 mg total) by mouth daily after supper.   torsemide 20 MG tablet Commonly known as: DEMADEX Take 2 tablets (40 mg total) by mouth 2 (two) times daily. What changed:   how much to take  when to take this            Durable Medical Equipment  (From admission, onward)         Start     Ordered   07/17/19 0803  For home use only DME oxygen  Once    Question Answer Comment  Length of Need 6 Months   Mode or (Route) Nasal cannula   Liters per Minute 2   Frequency Continuous (stationary and portable oxygen unit needed)   Oxygen conserving device Yes   Oxygen delivery system Gas      07/17/19 0802          Discharge Assessment: Vitals:   07/16/19 2105 07/17/19 0527  BP: (!) 137/50 (!) 137/57  Pulse: 68 61  Resp: 18 18  Temp: 98.1 F (36.7 C) 98.2 F (36.8 C)  SpO2: 100% 100%   Skin clean, dry and intact without evidence of skin break down, no evidence of skin tears noted. IV catheter discontinued intact. Site without signs and symptoms of complications - no redness or edema noted at insertion site, patient denies c/o pain - only slight tenderness at site.  Dressing with slight pressure applied.  D/c Instructions-Education: Discharge instructions given to patient/family with verbalized understanding. D/c education completed with patient/family including follow up instructions, medication list, d/c activities limitations if indicated, with other d/c instructions as indicated by MD - patient able to verbalize understanding, all questions fully answered. Patient instructed to return to ED, call 911, or call MD for any changes in condition.  Patient escorted via [place], and D/C home via private auto.  Zachery Conch, RN 07/17/2019 2:46 PM

## 2019-07-17 NOTE — Progress Notes (Signed)
SATURATION QUALIFICATIONS: (This note is used to comply with regulatory documentation for home oxygen)  Patient Saturations on Room Air at Rest = 98% Patient Saturations on Room Air while Ambulating = 87%  Patient Saturations on 2 Liters of oxygen while Ambulating = 96%  Please briefly explain why patient needs home oxygen:  Patient's O2 saturation drops when ambulating on room air.

## 2019-07-17 NOTE — TOC Transition Note (Signed)
Transition of Care [facility]) - CM/SW Discharge Note   Patient Details  Name: Danny [name] MRN: [id number] Date of Birth: [birth year]  Transition [facility] CM/SW Contact:  Boneta Lucks, RN Phone Number: 07/17/2019, 1:00 PM   Clinical Narrative:   Patient medically ready and per Claiborne Billings his bed ready at [facility]. Clinical sent in hub. MD and RN updated, number for report provided.     Final next level of care: Skilled Nursing Facility Barriers to Discharge: Barriers Resolved   Patient Goals and CMS Choice Patient states their goals for this hospitalization and ongoing recovery are:: to go to rehab then return home. CMS Medicare.gov Compare Post Acute Care list provided to:: Patient Choice offered to / list presented to : Patient  Discharge Placement              Patient chooses bed at: [facility] Patient to be transferred to facility by: EMS   Patient and family notified of of transfer: 07/17/19   Readmission Risk Interventions Readmission Risk Prevention Plan 07/11/2019 03/05/2019  Transportation Screening Complete Complete  PCP or Specialist Appt within 3-5 Days - Not Complete  Not Complete comments - Going to facility and will be seen by facility medical director.  [place] or [place] - Complete  Social Work Consult for Recovery Care Planning/Counseling - Complete  Palliative Care Screening - Not Applicable  Medication Review Press photographer) Complete Complete  PCP or Specialist appointment within 3-5 days of discharge Not Complete -  [place] or Home Care Consult Complete -  SW Recovery Care/Counseling Consult Complete -  Palliative Care Screening Not Complete -  Skilled Nursing Facility Complete -  Some recent data might be hidden

## 2019-07-17 NOTE — Plan of Care (Signed)
  Problem: Education: Goal: Knowledge of General Education information will improve Description: Including pain rating scale, medication(s)/side effects and non-pharmacologic comfort measures Outcome: Adequate for Discharge   Problem: Health Behavior/Discharge Planning: Goal: Ability to manage health-related needs will improve Outcome: Adequate for Discharge   Problem: Clinical Measurements: Goal: Ability to maintain clinical measurements within normal limits will improve Outcome: Adequate for Discharge Goal: Will remain free from infection Outcome: Adequate for Discharge Goal: Diagnostic test results will improve Outcome: Adequate for Discharge Goal: Respiratory complications will improve Outcome: Adequate for Discharge Goal: Cardiovascular complication will be avoided Outcome: Adequate for Discharge   Problem: Activity: Goal: Risk for activity intolerance will decrease Outcome: Adequate for Discharge   Problem: Nutrition: Goal: Adequate nutrition will be maintained Outcome: Adequate for Discharge   Problem: Elimination: Goal: Will not experience complications related to urinary retention Outcome: Adequate for Discharge   Problem: Skin Integrity: Goal: Risk for impaired skin integrity will decrease Outcome: Adequate for Discharge   Problem: Education: Goal: Knowledge of General Education information will improve Description: Including pain rating scale, medication(s)/side effects and non-pharmacologic comfort measures Outcome: Adequate for Discharge   Problem: Health Behavior/Discharge Planning: Goal: Ability to manage health-related needs will improve Outcome: Adequate for Discharge   Problem: Clinical Measurements: Goal: Ability to maintain clinical measurements within normal limits will improve Outcome: Adequate for Discharge Goal: Will remain free from infection Outcome: Adequate for Discharge Goal: Diagnostic test results will improve Outcome: Adequate for  Discharge Goal: Respiratory complications will improve Outcome: Adequate for Discharge Goal: Cardiovascular complication will be avoided Outcome: Adequate for Discharge   Problem: Activity: Goal: Risk for activity intolerance will decrease Outcome: Adequate for Discharge   Problem: Nutrition: Goal: Adequate nutrition will be maintained Outcome: Adequate for Discharge   Problem: Coping: Goal: Level of anxiety will decrease Outcome: Adequate for Discharge   Problem: Elimination: Goal: Will not experience complications related to bowel motility Outcome: Adequate for Discharge Goal: Will not experience complications related to urinary retention Outcome: Adequate for Discharge   Problem: Pain Managment: Goal: General experience of comfort will improve Outcome: Adequate for Discharge   Problem: Safety: Goal: Ability to remain free from injury will improve Outcome: Adequate for Discharge   Problem: Skin Integrity: Goal: Risk for impaired skin integrity will decrease Outcome: Adequate for Discharge

## 2019-07-17 NOTE — Progress Notes (Signed)
Inpatient Diabetes Program Recommendations  AACE/ADA: New Consensus Statement on Inpatient Glycemic Control (2015)  Target Ranges:  Prepandial:   less than 140 mg/dL      Peak postprandial:   less than 180 mg/dL (1-2 hours)      Critically ill patients:  140 - 180 mg/dL   Lab Results  Component Value Date   GLUCAP 190 (H) 07/17/2019   HGBA1C 7.0 (H) 07/07/2019    Review of Glycemic Control Results for OMRAN, KEELIN (MRN [id number]) as of 07/17/2019 10:34  Ref. Range 07/16/2019 07:48 07/16/2019 11:21 07/16/2019 16:27 07/16/2019 21:29 07/17/2019 07:21  Glucose-Capillary Latest Ref Range: 70 - 99 mg/dL 200 (H) 204 (H) 221 (H) 355 (H) 190 (H)   Inpatient Diabetes Program Recommendations:   -Increase 70/30 insulin to 8 units bid  Thank you, Nani Gasser. Jelisa , RN, MSN, CDE  Diabetes Coordinator Inpatient Glycemic Control Team Team Pager [phone] (8am-5pm) 07/17/2019 10:35 AM

## 2019-07-18 DIAGNOSIS — I152 Hypertension secondary to endocrine disorders: Secondary | ICD-10-CM | POA: Diagnosis not present

## 2019-07-18 DIAGNOSIS — E1159 Type 2 diabetes mellitus with other circulatory complications: Secondary | ICD-10-CM | POA: Diagnosis not present

## 2019-07-18 DIAGNOSIS — R5381 Other malaise: Secondary | ICD-10-CM | POA: Diagnosis not present

## 2019-07-18 DIAGNOSIS — I509 Heart failure, unspecified: Secondary | ICD-10-CM | POA: Diagnosis not present

## 2019-07-22 ENCOUNTER — Ambulatory Visit: Payer: Medicare HMO

## 2019-07-22 DIAGNOSIS — R5381 Other malaise: Secondary | ICD-10-CM | POA: Diagnosis not present

## 2019-07-22 DIAGNOSIS — I509 Heart failure, unspecified: Secondary | ICD-10-CM | POA: Diagnosis not present

## 2019-07-22 DIAGNOSIS — M109 Gout, unspecified: Secondary | ICD-10-CM | POA: Diagnosis not present

## 2019-07-24 DIAGNOSIS — E1159 Type 2 diabetes mellitus with other circulatory complications: Secondary | ICD-10-CM | POA: Diagnosis not present

## 2019-07-24 DIAGNOSIS — I509 Heart failure, unspecified: Secondary | ICD-10-CM | POA: Diagnosis not present

## 2019-07-24 DIAGNOSIS — I152 Hypertension secondary to endocrine disorders: Secondary | ICD-10-CM | POA: Diagnosis not present

## 2019-07-24 DIAGNOSIS — R5381 Other malaise: Secondary | ICD-10-CM | POA: Diagnosis not present

## 2019-07-27 DIAGNOSIS — E11622 Type 2 diabetes mellitus with other skin ulcer: Secondary | ICD-10-CM | POA: Diagnosis not present

## 2019-07-27 DIAGNOSIS — E1122 Type 2 diabetes mellitus with diabetic chronic kidney disease: Secondary | ICD-10-CM | POA: Diagnosis not present

## 2019-07-27 DIAGNOSIS — E11621 Type 2 diabetes mellitus with foot ulcer: Secondary | ICD-10-CM | POA: Diagnosis not present

## 2019-07-27 DIAGNOSIS — L97312 Non-pressure chronic ulcer of right ankle with fat layer exposed: Secondary | ICD-10-CM | POA: Diagnosis not present

## 2019-07-27 DIAGNOSIS — L97422 Non-pressure chronic ulcer of left heel and midfoot with fat layer exposed: Secondary | ICD-10-CM | POA: Diagnosis not present

## 2019-07-27 DIAGNOSIS — B952 Enterococcus as the cause of diseases classified elsewhere: Secondary | ICD-10-CM | POA: Diagnosis not present

## 2019-07-27 DIAGNOSIS — D631 Anemia in chronic kidney disease: Secondary | ICD-10-CM | POA: Diagnosis not present

## 2019-07-27 DIAGNOSIS — N390 Urinary tract infection, site not specified: Secondary | ICD-10-CM | POA: Diagnosis not present

## 2019-07-27 DIAGNOSIS — N184 Chronic kidney disease, stage 4 (severe): Secondary | ICD-10-CM | POA: Diagnosis not present

## 2019-07-28 ENCOUNTER — Telehealth: Payer: Self-pay | Admitting: Urology

## 2019-07-28 NOTE — Telephone Encounter (Signed)
Danny [name] called and states she needs to know if this pts foley cath will be managed ny [facility] at home or by [place]. Sheasked a Marine scientist return her call at [phone].

## 2019-07-29 DIAGNOSIS — N390 Urinary tract infection, site not specified: Secondary | ICD-10-CM | POA: Diagnosis not present

## 2019-07-29 DIAGNOSIS — L97422 Non-pressure chronic ulcer of left heel and midfoot with fat layer exposed: Secondary | ICD-10-CM | POA: Diagnosis not present

## 2019-07-29 DIAGNOSIS — L97312 Non-pressure chronic ulcer of right ankle with fat layer exposed: Secondary | ICD-10-CM | POA: Diagnosis not present

## 2019-07-29 DIAGNOSIS — D631 Anemia in chronic kidney disease: Secondary | ICD-10-CM | POA: Diagnosis not present

## 2019-07-29 DIAGNOSIS — E11621 Type 2 diabetes mellitus with foot ulcer: Secondary | ICD-10-CM | POA: Diagnosis not present

## 2019-07-29 DIAGNOSIS — N184 Chronic kidney disease, stage 4 (severe): Secondary | ICD-10-CM | POA: Diagnosis not present

## 2019-07-29 DIAGNOSIS — B952 Enterococcus as the cause of diseases classified elsewhere: Secondary | ICD-10-CM | POA: Diagnosis not present

## 2019-07-29 DIAGNOSIS — E1122 Type 2 diabetes mellitus with diabetic chronic kidney disease: Secondary | ICD-10-CM | POA: Diagnosis not present

## 2019-07-29 DIAGNOSIS — E11622 Type 2 diabetes mellitus with other skin ulcer: Secondary | ICD-10-CM | POA: Diagnosis not present

## 2019-07-29 NOTE — Telephone Encounter (Signed)
Spoke with [place] at [place] at [facility]- they will do monthly catheter changes for pt.

## 2019-07-31 DIAGNOSIS — D631 Anemia in chronic kidney disease: Secondary | ICD-10-CM | POA: Diagnosis not present

## 2019-07-31 DIAGNOSIS — E11622 Type 2 diabetes mellitus with other skin ulcer: Secondary | ICD-10-CM | POA: Diagnosis not present

## 2019-07-31 DIAGNOSIS — L97422 Non-pressure chronic ulcer of left heel and midfoot with fat layer exposed: Secondary | ICD-10-CM | POA: Diagnosis not present

## 2019-07-31 DIAGNOSIS — E1122 Type 2 diabetes mellitus with diabetic chronic kidney disease: Secondary | ICD-10-CM | POA: Diagnosis not present

## 2019-07-31 DIAGNOSIS — B952 Enterococcus as the cause of diseases classified elsewhere: Secondary | ICD-10-CM | POA: Diagnosis not present

## 2019-07-31 DIAGNOSIS — N184 Chronic kidney disease, stage 4 (severe): Secondary | ICD-10-CM | POA: Diagnosis not present

## 2019-07-31 DIAGNOSIS — E11621 Type 2 diabetes mellitus with foot ulcer: Secondary | ICD-10-CM | POA: Diagnosis not present

## 2019-07-31 DIAGNOSIS — L97312 Non-pressure chronic ulcer of right ankle with fat layer exposed: Secondary | ICD-10-CM | POA: Diagnosis not present

## 2019-07-31 DIAGNOSIS — N390 Urinary tract infection, site not specified: Secondary | ICD-10-CM | POA: Diagnosis not present

## 2019-08-04 DIAGNOSIS — N184 Chronic kidney disease, stage 4 (severe): Secondary | ICD-10-CM | POA: Diagnosis not present

## 2019-08-04 DIAGNOSIS — E11622 Type 2 diabetes mellitus with other skin ulcer: Secondary | ICD-10-CM | POA: Diagnosis not present

## 2019-08-04 DIAGNOSIS — L97422 Non-pressure chronic ulcer of left heel and midfoot with fat layer exposed: Secondary | ICD-10-CM | POA: Diagnosis not present

## 2019-08-04 DIAGNOSIS — E1122 Type 2 diabetes mellitus with diabetic chronic kidney disease: Secondary | ICD-10-CM | POA: Diagnosis not present

## 2019-08-04 DIAGNOSIS — E11621 Type 2 diabetes mellitus with foot ulcer: Secondary | ICD-10-CM | POA: Diagnosis not present

## 2019-08-04 DIAGNOSIS — D631 Anemia in chronic kidney disease: Secondary | ICD-10-CM | POA: Diagnosis not present

## 2019-08-04 DIAGNOSIS — B952 Enterococcus as the cause of diseases classified elsewhere: Secondary | ICD-10-CM | POA: Diagnosis not present

## 2019-08-04 DIAGNOSIS — N390 Urinary tract infection, site not specified: Secondary | ICD-10-CM | POA: Diagnosis not present

## 2019-08-04 DIAGNOSIS — L97312 Non-pressure chronic ulcer of right ankle with fat layer exposed: Secondary | ICD-10-CM | POA: Diagnosis not present

## 2019-08-05 DIAGNOSIS — E11621 Type 2 diabetes mellitus with foot ulcer: Secondary | ICD-10-CM | POA: Diagnosis not present

## 2019-08-05 DIAGNOSIS — E1122 Type 2 diabetes mellitus with diabetic chronic kidney disease: Secondary | ICD-10-CM | POA: Diagnosis not present

## 2019-08-05 DIAGNOSIS — L97522 Non-pressure chronic ulcer of other part of left foot with fat layer exposed: Secondary | ICD-10-CM | POA: Diagnosis not present

## 2019-08-05 DIAGNOSIS — J9602 Acute respiratory failure with hypercapnia: Secondary | ICD-10-CM | POA: Diagnosis not present

## 2019-08-05 DIAGNOSIS — L97422 Non-pressure chronic ulcer of left heel and midfoot with fat layer exposed: Secondary | ICD-10-CM | POA: Diagnosis not present

## 2019-08-05 DIAGNOSIS — D631 Anemia in chronic kidney disease: Secondary | ICD-10-CM | POA: Diagnosis not present

## 2019-08-05 DIAGNOSIS — L03115 Cellulitis of right lower limb: Secondary | ICD-10-CM | POA: Diagnosis not present

## 2019-08-05 DIAGNOSIS — I5033 Acute on chronic diastolic (congestive) heart failure: Secondary | ICD-10-CM | POA: Diagnosis not present

## 2019-08-05 DIAGNOSIS — B952 Enterococcus as the cause of diseases classified elsewhere: Secondary | ICD-10-CM | POA: Diagnosis not present

## 2019-08-05 DIAGNOSIS — L89513 Pressure ulcer of right ankle, stage 3: Secondary | ICD-10-CM | POA: Diagnosis not present

## 2019-08-05 DIAGNOSIS — N390 Urinary tract infection, site not specified: Secondary | ICD-10-CM | POA: Diagnosis not present

## 2019-08-05 DIAGNOSIS — E11622 Type 2 diabetes mellitus with other skin ulcer: Secondary | ICD-10-CM | POA: Diagnosis not present

## 2019-08-05 DIAGNOSIS — N184 Chronic kidney disease, stage 4 (severe): Secondary | ICD-10-CM | POA: Diagnosis not present

## 2019-08-05 DIAGNOSIS — L97312 Non-pressure chronic ulcer of right ankle with fat layer exposed: Secondary | ICD-10-CM | POA: Diagnosis not present

## 2019-08-07 DIAGNOSIS — L97312 Non-pressure chronic ulcer of right ankle with fat layer exposed: Secondary | ICD-10-CM | POA: Diagnosis not present

## 2019-08-07 DIAGNOSIS — B952 Enterococcus as the cause of diseases classified elsewhere: Secondary | ICD-10-CM | POA: Diagnosis not present

## 2019-08-07 DIAGNOSIS — L97422 Non-pressure chronic ulcer of left heel and midfoot with fat layer exposed: Secondary | ICD-10-CM | POA: Diagnosis not present

## 2019-08-07 DIAGNOSIS — E1122 Type 2 diabetes mellitus with diabetic chronic kidney disease: Secondary | ICD-10-CM | POA: Diagnosis not present

## 2019-08-07 DIAGNOSIS — E11621 Type 2 diabetes mellitus with foot ulcer: Secondary | ICD-10-CM | POA: Diagnosis not present

## 2019-08-07 DIAGNOSIS — N184 Chronic kidney disease, stage 4 (severe): Secondary | ICD-10-CM | POA: Diagnosis not present

## 2019-08-07 DIAGNOSIS — N390 Urinary tract infection, site not specified: Secondary | ICD-10-CM | POA: Diagnosis not present

## 2019-08-07 DIAGNOSIS — E11622 Type 2 diabetes mellitus with other skin ulcer: Secondary | ICD-10-CM | POA: Diagnosis not present

## 2019-08-07 DIAGNOSIS — D631 Anemia in chronic kidney disease: Secondary | ICD-10-CM | POA: Diagnosis not present

## 2019-08-11 DIAGNOSIS — L97312 Non-pressure chronic ulcer of right ankle with fat layer exposed: Secondary | ICD-10-CM | POA: Diagnosis not present

## 2019-08-11 DIAGNOSIS — B952 Enterococcus as the cause of diseases classified elsewhere: Secondary | ICD-10-CM | POA: Diagnosis not present

## 2019-08-11 DIAGNOSIS — N184 Chronic kidney disease, stage 4 (severe): Secondary | ICD-10-CM | POA: Diagnosis not present

## 2019-08-11 DIAGNOSIS — L97422 Non-pressure chronic ulcer of left heel and midfoot with fat layer exposed: Secondary | ICD-10-CM | POA: Diagnosis not present

## 2019-08-11 DIAGNOSIS — E1122 Type 2 diabetes mellitus with diabetic chronic kidney disease: Secondary | ICD-10-CM | POA: Diagnosis not present

## 2019-08-11 DIAGNOSIS — E11621 Type 2 diabetes mellitus with foot ulcer: Secondary | ICD-10-CM | POA: Diagnosis not present

## 2019-08-11 DIAGNOSIS — D631 Anemia in chronic kidney disease: Secondary | ICD-10-CM | POA: Diagnosis not present

## 2019-08-11 DIAGNOSIS — N390 Urinary tract infection, site not specified: Secondary | ICD-10-CM | POA: Diagnosis not present

## 2019-08-11 DIAGNOSIS — E11622 Type 2 diabetes mellitus with other skin ulcer: Secondary | ICD-10-CM | POA: Diagnosis not present

## 2019-08-14 DIAGNOSIS — L97312 Non-pressure chronic ulcer of right ankle with fat layer exposed: Secondary | ICD-10-CM | POA: Diagnosis not present

## 2019-08-14 DIAGNOSIS — B952 Enterococcus as the cause of diseases classified elsewhere: Secondary | ICD-10-CM | POA: Diagnosis not present

## 2019-08-14 DIAGNOSIS — L97422 Non-pressure chronic ulcer of left heel and midfoot with fat layer exposed: Secondary | ICD-10-CM | POA: Diagnosis not present

## 2019-08-14 DIAGNOSIS — N184 Chronic kidney disease, stage 4 (severe): Secondary | ICD-10-CM | POA: Diagnosis not present

## 2019-08-14 DIAGNOSIS — D631 Anemia in chronic kidney disease: Secondary | ICD-10-CM | POA: Diagnosis not present

## 2019-08-14 DIAGNOSIS — N390 Urinary tract infection, site not specified: Secondary | ICD-10-CM | POA: Diagnosis not present

## 2019-08-14 DIAGNOSIS — E1122 Type 2 diabetes mellitus with diabetic chronic kidney disease: Secondary | ICD-10-CM | POA: Diagnosis not present

## 2019-08-14 DIAGNOSIS — E11622 Type 2 diabetes mellitus with other skin ulcer: Secondary | ICD-10-CM | POA: Diagnosis not present

## 2019-08-14 DIAGNOSIS — E11621 Type 2 diabetes mellitus with foot ulcer: Secondary | ICD-10-CM | POA: Diagnosis not present

## 2019-08-18 DIAGNOSIS — D631 Anemia in chronic kidney disease: Secondary | ICD-10-CM | POA: Diagnosis not present

## 2019-08-18 DIAGNOSIS — E11622 Type 2 diabetes mellitus with other skin ulcer: Secondary | ICD-10-CM | POA: Diagnosis not present

## 2019-08-18 DIAGNOSIS — N390 Urinary tract infection, site not specified: Secondary | ICD-10-CM | POA: Diagnosis not present

## 2019-08-18 DIAGNOSIS — B952 Enterococcus as the cause of diseases classified elsewhere: Secondary | ICD-10-CM | POA: Diagnosis not present

## 2019-08-18 DIAGNOSIS — L97312 Non-pressure chronic ulcer of right ankle with fat layer exposed: Secondary | ICD-10-CM | POA: Diagnosis not present

## 2019-08-18 DIAGNOSIS — L97422 Non-pressure chronic ulcer of left heel and midfoot with fat layer exposed: Secondary | ICD-10-CM | POA: Diagnosis not present

## 2019-08-18 DIAGNOSIS — N184 Chronic kidney disease, stage 4 (severe): Secondary | ICD-10-CM | POA: Diagnosis not present

## 2019-08-18 DIAGNOSIS — E11621 Type 2 diabetes mellitus with foot ulcer: Secondary | ICD-10-CM | POA: Diagnosis not present

## 2019-08-18 DIAGNOSIS — E1122 Type 2 diabetes mellitus with diabetic chronic kidney disease: Secondary | ICD-10-CM | POA: Diagnosis not present

## 2019-08-19 DIAGNOSIS — L97312 Non-pressure chronic ulcer of right ankle with fat layer exposed: Secondary | ICD-10-CM | POA: Diagnosis not present

## 2019-08-19 DIAGNOSIS — N184 Chronic kidney disease, stage 4 (severe): Secondary | ICD-10-CM | POA: Diagnosis not present

## 2019-08-19 DIAGNOSIS — E1122 Type 2 diabetes mellitus with diabetic chronic kidney disease: Secondary | ICD-10-CM | POA: Diagnosis not present

## 2019-08-19 DIAGNOSIS — E11622 Type 2 diabetes mellitus with other skin ulcer: Secondary | ICD-10-CM | POA: Diagnosis not present

## 2019-08-19 DIAGNOSIS — L97422 Non-pressure chronic ulcer of left heel and midfoot with fat layer exposed: Secondary | ICD-10-CM | POA: Diagnosis not present

## 2019-08-19 DIAGNOSIS — B952 Enterococcus as the cause of diseases classified elsewhere: Secondary | ICD-10-CM | POA: Diagnosis not present

## 2019-08-19 DIAGNOSIS — E11621 Type 2 diabetes mellitus with foot ulcer: Secondary | ICD-10-CM | POA: Diagnosis not present

## 2019-08-19 DIAGNOSIS — D631 Anemia in chronic kidney disease: Secondary | ICD-10-CM | POA: Diagnosis not present

## 2019-08-19 DIAGNOSIS — N390 Urinary tract infection, site not specified: Secondary | ICD-10-CM | POA: Diagnosis not present

## 2019-08-21 DIAGNOSIS — E1122 Type 2 diabetes mellitus with diabetic chronic kidney disease: Secondary | ICD-10-CM | POA: Diagnosis not present

## 2019-08-21 DIAGNOSIS — D631 Anemia in chronic kidney disease: Secondary | ICD-10-CM | POA: Diagnosis not present

## 2019-08-21 DIAGNOSIS — N184 Chronic kidney disease, stage 4 (severe): Secondary | ICD-10-CM | POA: Diagnosis not present

## 2019-08-21 DIAGNOSIS — L97422 Non-pressure chronic ulcer of left heel and midfoot with fat layer exposed: Secondary | ICD-10-CM | POA: Diagnosis not present

## 2019-08-21 DIAGNOSIS — L97312 Non-pressure chronic ulcer of right ankle with fat layer exposed: Secondary | ICD-10-CM | POA: Diagnosis not present

## 2019-08-21 DIAGNOSIS — B952 Enterococcus as the cause of diseases classified elsewhere: Secondary | ICD-10-CM | POA: Diagnosis not present

## 2019-08-21 DIAGNOSIS — E11622 Type 2 diabetes mellitus with other skin ulcer: Secondary | ICD-10-CM | POA: Diagnosis not present

## 2019-08-21 DIAGNOSIS — E11621 Type 2 diabetes mellitus with foot ulcer: Secondary | ICD-10-CM | POA: Diagnosis not present

## 2019-08-21 DIAGNOSIS — N390 Urinary tract infection, site not specified: Secondary | ICD-10-CM | POA: Diagnosis not present

## 2019-08-25 DIAGNOSIS — N184 Chronic kidney disease, stage 4 (severe): Secondary | ICD-10-CM | POA: Diagnosis not present

## 2019-08-25 DIAGNOSIS — E11621 Type 2 diabetes mellitus with foot ulcer: Secondary | ICD-10-CM | POA: Diagnosis not present

## 2019-08-25 DIAGNOSIS — D631 Anemia in chronic kidney disease: Secondary | ICD-10-CM | POA: Diagnosis not present

## 2019-08-25 DIAGNOSIS — L97422 Non-pressure chronic ulcer of left heel and midfoot with fat layer exposed: Secondary | ICD-10-CM | POA: Diagnosis not present

## 2019-08-25 DIAGNOSIS — L97312 Non-pressure chronic ulcer of right ankle with fat layer exposed: Secondary | ICD-10-CM | POA: Diagnosis not present

## 2019-08-25 DIAGNOSIS — N390 Urinary tract infection, site not specified: Secondary | ICD-10-CM | POA: Diagnosis not present

## 2019-08-25 DIAGNOSIS — E1122 Type 2 diabetes mellitus with diabetic chronic kidney disease: Secondary | ICD-10-CM | POA: Diagnosis not present

## 2019-08-25 DIAGNOSIS — B952 Enterococcus as the cause of diseases classified elsewhere: Secondary | ICD-10-CM | POA: Diagnosis not present

## 2019-08-25 DIAGNOSIS — E11622 Type 2 diabetes mellitus with other skin ulcer: Secondary | ICD-10-CM | POA: Diagnosis not present

## 2019-08-26 DIAGNOSIS — E1122 Type 2 diabetes mellitus with diabetic chronic kidney disease: Secondary | ICD-10-CM | POA: Diagnosis not present

## 2019-08-26 DIAGNOSIS — N390 Urinary tract infection, site not specified: Secondary | ICD-10-CM | POA: Diagnosis not present

## 2019-08-26 DIAGNOSIS — L97422 Non-pressure chronic ulcer of left heel and midfoot with fat layer exposed: Secondary | ICD-10-CM | POA: Diagnosis not present

## 2019-08-26 DIAGNOSIS — L97312 Non-pressure chronic ulcer of right ankle with fat layer exposed: Secondary | ICD-10-CM | POA: Diagnosis not present

## 2019-08-26 DIAGNOSIS — D631 Anemia in chronic kidney disease: Secondary | ICD-10-CM | POA: Diagnosis not present

## 2019-08-26 DIAGNOSIS — N184 Chronic kidney disease, stage 4 (severe): Secondary | ICD-10-CM | POA: Diagnosis not present

## 2019-08-26 DIAGNOSIS — B952 Enterococcus as the cause of diseases classified elsewhere: Secondary | ICD-10-CM | POA: Diagnosis not present

## 2019-08-26 DIAGNOSIS — E11621 Type 2 diabetes mellitus with foot ulcer: Secondary | ICD-10-CM | POA: Diagnosis not present

## 2019-08-26 DIAGNOSIS — E11622 Type 2 diabetes mellitus with other skin ulcer: Secondary | ICD-10-CM | POA: Diagnosis not present

## 2019-08-28 DIAGNOSIS — E1122 Type 2 diabetes mellitus with diabetic chronic kidney disease: Secondary | ICD-10-CM | POA: Diagnosis not present

## 2019-08-28 DIAGNOSIS — B952 Enterococcus as the cause of diseases classified elsewhere: Secondary | ICD-10-CM | POA: Diagnosis not present

## 2019-08-28 DIAGNOSIS — N184 Chronic kidney disease, stage 4 (severe): Secondary | ICD-10-CM | POA: Diagnosis not present

## 2019-08-28 DIAGNOSIS — E11622 Type 2 diabetes mellitus with other skin ulcer: Secondary | ICD-10-CM | POA: Diagnosis not present

## 2019-08-28 DIAGNOSIS — L97312 Non-pressure chronic ulcer of right ankle with fat layer exposed: Secondary | ICD-10-CM | POA: Diagnosis not present

## 2019-08-28 DIAGNOSIS — E11621 Type 2 diabetes mellitus with foot ulcer: Secondary | ICD-10-CM | POA: Diagnosis not present

## 2019-08-28 DIAGNOSIS — D631 Anemia in chronic kidney disease: Secondary | ICD-10-CM | POA: Diagnosis not present

## 2019-08-28 DIAGNOSIS — N390 Urinary tract infection, site not specified: Secondary | ICD-10-CM | POA: Diagnosis not present

## 2019-08-28 DIAGNOSIS — L97422 Non-pressure chronic ulcer of left heel and midfoot with fat layer exposed: Secondary | ICD-10-CM | POA: Diagnosis not present

## 2019-09-01 DIAGNOSIS — E1122 Type 2 diabetes mellitus with diabetic chronic kidney disease: Secondary | ICD-10-CM | POA: Diagnosis not present

## 2019-09-01 DIAGNOSIS — E11621 Type 2 diabetes mellitus with foot ulcer: Secondary | ICD-10-CM | POA: Diagnosis not present

## 2019-09-01 DIAGNOSIS — E11622 Type 2 diabetes mellitus with other skin ulcer: Secondary | ICD-10-CM | POA: Diagnosis not present

## 2019-09-01 DIAGNOSIS — L97422 Non-pressure chronic ulcer of left heel and midfoot with fat layer exposed: Secondary | ICD-10-CM | POA: Diagnosis not present

## 2019-09-01 DIAGNOSIS — N390 Urinary tract infection, site not specified: Secondary | ICD-10-CM | POA: Diagnosis not present

## 2019-09-01 DIAGNOSIS — B952 Enterococcus as the cause of diseases classified elsewhere: Secondary | ICD-10-CM | POA: Diagnosis not present

## 2019-09-01 DIAGNOSIS — D631 Anemia in chronic kidney disease: Secondary | ICD-10-CM | POA: Diagnosis not present

## 2019-09-01 DIAGNOSIS — N184 Chronic kidney disease, stage 4 (severe): Secondary | ICD-10-CM | POA: Diagnosis not present

## 2019-09-01 DIAGNOSIS — L97312 Non-pressure chronic ulcer of right ankle with fat layer exposed: Secondary | ICD-10-CM | POA: Diagnosis not present

## 2019-09-04 DIAGNOSIS — E11622 Type 2 diabetes mellitus with other skin ulcer: Secondary | ICD-10-CM | POA: Diagnosis not present

## 2019-09-04 DIAGNOSIS — N184 Chronic kidney disease, stage 4 (severe): Secondary | ICD-10-CM | POA: Diagnosis not present

## 2019-09-04 DIAGNOSIS — B952 Enterococcus as the cause of diseases classified elsewhere: Secondary | ICD-10-CM | POA: Diagnosis not present

## 2019-09-04 DIAGNOSIS — E1122 Type 2 diabetes mellitus with diabetic chronic kidney disease: Secondary | ICD-10-CM | POA: Diagnosis not present

## 2019-09-04 DIAGNOSIS — L97422 Non-pressure chronic ulcer of left heel and midfoot with fat layer exposed: Secondary | ICD-10-CM | POA: Diagnosis not present

## 2019-09-04 DIAGNOSIS — L97312 Non-pressure chronic ulcer of right ankle with fat layer exposed: Secondary | ICD-10-CM | POA: Diagnosis not present

## 2019-09-04 DIAGNOSIS — N390 Urinary tract infection, site not specified: Secondary | ICD-10-CM | POA: Diagnosis not present

## 2019-09-04 DIAGNOSIS — E11621 Type 2 diabetes mellitus with foot ulcer: Secondary | ICD-10-CM | POA: Diagnosis not present

## 2019-09-04 DIAGNOSIS — D631 Anemia in chronic kidney disease: Secondary | ICD-10-CM | POA: Diagnosis not present

## 2019-09-08 DIAGNOSIS — N184 Chronic kidney disease, stage 4 (severe): Secondary | ICD-10-CM | POA: Diagnosis not present

## 2019-09-08 DIAGNOSIS — E11622 Type 2 diabetes mellitus with other skin ulcer: Secondary | ICD-10-CM | POA: Diagnosis not present

## 2019-09-08 DIAGNOSIS — B952 Enterococcus as the cause of diseases classified elsewhere: Secondary | ICD-10-CM | POA: Diagnosis not present

## 2019-09-08 DIAGNOSIS — N390 Urinary tract infection, site not specified: Secondary | ICD-10-CM | POA: Diagnosis not present

## 2019-09-08 DIAGNOSIS — D631 Anemia in chronic kidney disease: Secondary | ICD-10-CM | POA: Diagnosis not present

## 2019-09-08 DIAGNOSIS — L97422 Non-pressure chronic ulcer of left heel and midfoot with fat layer exposed: Secondary | ICD-10-CM | POA: Diagnosis not present

## 2019-09-08 DIAGNOSIS — E11621 Type 2 diabetes mellitus with foot ulcer: Secondary | ICD-10-CM | POA: Diagnosis not present

## 2019-09-08 DIAGNOSIS — L97312 Non-pressure chronic ulcer of right ankle with fat layer exposed: Secondary | ICD-10-CM | POA: Diagnosis not present

## 2019-09-08 DIAGNOSIS — E1122 Type 2 diabetes mellitus with diabetic chronic kidney disease: Secondary | ICD-10-CM | POA: Diagnosis not present

## 2019-09-09 DIAGNOSIS — E11622 Type 2 diabetes mellitus with other skin ulcer: Secondary | ICD-10-CM | POA: Diagnosis not present

## 2019-09-09 DIAGNOSIS — B952 Enterococcus as the cause of diseases classified elsewhere: Secondary | ICD-10-CM | POA: Diagnosis not present

## 2019-09-09 DIAGNOSIS — E11621 Type 2 diabetes mellitus with foot ulcer: Secondary | ICD-10-CM | POA: Diagnosis not present

## 2019-09-09 DIAGNOSIS — L97312 Non-pressure chronic ulcer of right ankle with fat layer exposed: Secondary | ICD-10-CM | POA: Diagnosis not present

## 2019-09-09 DIAGNOSIS — N390 Urinary tract infection, site not specified: Secondary | ICD-10-CM | POA: Diagnosis not present

## 2019-09-09 DIAGNOSIS — L97422 Non-pressure chronic ulcer of left heel and midfoot with fat layer exposed: Secondary | ICD-10-CM | POA: Diagnosis not present

## 2019-09-09 DIAGNOSIS — E1122 Type 2 diabetes mellitus with diabetic chronic kidney disease: Secondary | ICD-10-CM | POA: Diagnosis not present

## 2019-09-09 DIAGNOSIS — N184 Chronic kidney disease, stage 4 (severe): Secondary | ICD-10-CM | POA: Diagnosis not present

## 2019-09-09 DIAGNOSIS — D631 Anemia in chronic kidney disease: Secondary | ICD-10-CM | POA: Diagnosis not present

## 2019-09-11 DIAGNOSIS — E1122 Type 2 diabetes mellitus with diabetic chronic kidney disease: Secondary | ICD-10-CM | POA: Diagnosis not present

## 2019-09-11 DIAGNOSIS — E11622 Type 2 diabetes mellitus with other skin ulcer: Secondary | ICD-10-CM | POA: Diagnosis not present

## 2019-09-11 DIAGNOSIS — N390 Urinary tract infection, site not specified: Secondary | ICD-10-CM | POA: Diagnosis not present

## 2019-09-11 DIAGNOSIS — D631 Anemia in chronic kidney disease: Secondary | ICD-10-CM | POA: Diagnosis not present

## 2019-09-11 DIAGNOSIS — L97312 Non-pressure chronic ulcer of right ankle with fat layer exposed: Secondary | ICD-10-CM | POA: Diagnosis not present

## 2019-09-11 DIAGNOSIS — E11621 Type 2 diabetes mellitus with foot ulcer: Secondary | ICD-10-CM | POA: Diagnosis not present

## 2019-09-11 DIAGNOSIS — N184 Chronic kidney disease, stage 4 (severe): Secondary | ICD-10-CM | POA: Diagnosis not present

## 2019-09-11 DIAGNOSIS — L97422 Non-pressure chronic ulcer of left heel and midfoot with fat layer exposed: Secondary | ICD-10-CM | POA: Diagnosis not present

## 2019-09-11 DIAGNOSIS — B952 Enterococcus as the cause of diseases classified elsewhere: Secondary | ICD-10-CM | POA: Diagnosis not present

## 2019-09-15 DIAGNOSIS — N184 Chronic kidney disease, stage 4 (severe): Secondary | ICD-10-CM | POA: Diagnosis not present

## 2019-09-15 DIAGNOSIS — L97312 Non-pressure chronic ulcer of right ankle with fat layer exposed: Secondary | ICD-10-CM | POA: Diagnosis not present

## 2019-09-15 DIAGNOSIS — N390 Urinary tract infection, site not specified: Secondary | ICD-10-CM | POA: Diagnosis not present

## 2019-09-15 DIAGNOSIS — D631 Anemia in chronic kidney disease: Secondary | ICD-10-CM | POA: Diagnosis not present

## 2019-09-15 DIAGNOSIS — L97422 Non-pressure chronic ulcer of left heel and midfoot with fat layer exposed: Secondary | ICD-10-CM | POA: Diagnosis not present

## 2019-09-15 DIAGNOSIS — E1122 Type 2 diabetes mellitus with diabetic chronic kidney disease: Secondary | ICD-10-CM | POA: Diagnosis not present

## 2019-09-15 DIAGNOSIS — E11621 Type 2 diabetes mellitus with foot ulcer: Secondary | ICD-10-CM | POA: Diagnosis not present

## 2019-09-15 DIAGNOSIS — B952 Enterococcus as the cause of diseases classified elsewhere: Secondary | ICD-10-CM | POA: Diagnosis not present

## 2019-09-15 DIAGNOSIS — E11622 Type 2 diabetes mellitus with other skin ulcer: Secondary | ICD-10-CM | POA: Diagnosis not present

## 2019-09-17 DIAGNOSIS — E559 Vitamin D deficiency, unspecified: Secondary | ICD-10-CM | POA: Diagnosis not present

## 2019-09-17 DIAGNOSIS — H9202 Otalgia, left ear: Secondary | ICD-10-CM | POA: Diagnosis not present

## 2019-09-17 DIAGNOSIS — E13621 Other specified diabetes mellitus with foot ulcer: Secondary | ICD-10-CM | POA: Diagnosis not present

## 2019-09-17 DIAGNOSIS — E1142 Type 2 diabetes mellitus with diabetic polyneuropathy: Secondary | ICD-10-CM | POA: Diagnosis not present

## 2019-09-17 DIAGNOSIS — E1121 Type 2 diabetes mellitus with diabetic nephropathy: Secondary | ICD-10-CM | POA: Diagnosis not present

## 2019-09-17 DIAGNOSIS — L03115 Cellulitis of right lower limb: Secondary | ICD-10-CM | POA: Diagnosis not present

## 2019-09-17 DIAGNOSIS — B379 Candidiasis, unspecified: Secondary | ICD-10-CM | POA: Diagnosis not present

## 2019-09-17 DIAGNOSIS — E11621 Type 2 diabetes mellitus with foot ulcer: Secondary | ICD-10-CM | POA: Diagnosis not present

## 2019-09-17 DIAGNOSIS — E1122 Type 2 diabetes mellitus with diabetic chronic kidney disease: Secondary | ICD-10-CM | POA: Diagnosis not present

## 2019-09-17 DIAGNOSIS — E782 Mixed hyperlipidemia: Secondary | ICD-10-CM | POA: Diagnosis not present

## 2019-09-17 DIAGNOSIS — L89513 Pressure ulcer of right ankle, stage 3: Secondary | ICD-10-CM | POA: Diagnosis not present

## 2019-09-17 DIAGNOSIS — E785 Hyperlipidemia, unspecified: Secondary | ICD-10-CM | POA: Diagnosis not present

## 2019-09-17 DIAGNOSIS — Z0000 Encounter for general adult medical examination without abnormal findings: Secondary | ICD-10-CM | POA: Diagnosis not present

## 2019-09-17 DIAGNOSIS — E1151 Type 2 diabetes mellitus with diabetic peripheral angiopathy without gangrene: Secondary | ICD-10-CM | POA: Diagnosis not present

## 2019-09-17 DIAGNOSIS — D509 Iron deficiency anemia, unspecified: Secondary | ICD-10-CM | POA: Diagnosis not present

## 2019-09-17 DIAGNOSIS — E8770 Fluid overload, unspecified: Secondary | ICD-10-CM | POA: Diagnosis not present

## 2019-09-17 DIAGNOSIS — B952 Enterococcus as the cause of diseases classified elsewhere: Secondary | ICD-10-CM | POA: Diagnosis not present

## 2019-09-17 DIAGNOSIS — D631 Anemia in chronic kidney disease: Secondary | ICD-10-CM | POA: Diagnosis not present

## 2019-09-18 DIAGNOSIS — N184 Chronic kidney disease, stage 4 (severe): Secondary | ICD-10-CM | POA: Diagnosis not present

## 2019-09-18 DIAGNOSIS — B952 Enterococcus as the cause of diseases classified elsewhere: Secondary | ICD-10-CM | POA: Diagnosis not present

## 2019-09-18 DIAGNOSIS — N390 Urinary tract infection, site not specified: Secondary | ICD-10-CM | POA: Diagnosis not present

## 2019-09-18 DIAGNOSIS — E11621 Type 2 diabetes mellitus with foot ulcer: Secondary | ICD-10-CM | POA: Diagnosis not present

## 2019-09-18 DIAGNOSIS — L97312 Non-pressure chronic ulcer of right ankle with fat layer exposed: Secondary | ICD-10-CM | POA: Diagnosis not present

## 2019-09-18 DIAGNOSIS — E1122 Type 2 diabetes mellitus with diabetic chronic kidney disease: Secondary | ICD-10-CM | POA: Diagnosis not present

## 2019-09-18 DIAGNOSIS — D631 Anemia in chronic kidney disease: Secondary | ICD-10-CM | POA: Diagnosis not present

## 2019-09-18 DIAGNOSIS — E11622 Type 2 diabetes mellitus with other skin ulcer: Secondary | ICD-10-CM | POA: Diagnosis not present

## 2019-09-18 DIAGNOSIS — L97422 Non-pressure chronic ulcer of left heel and midfoot with fat layer exposed: Secondary | ICD-10-CM | POA: Diagnosis not present

## 2019-09-22 DIAGNOSIS — E11621 Type 2 diabetes mellitus with foot ulcer: Secondary | ICD-10-CM | POA: Diagnosis not present

## 2019-09-22 DIAGNOSIS — L97312 Non-pressure chronic ulcer of right ankle with fat layer exposed: Secondary | ICD-10-CM | POA: Diagnosis not present

## 2019-09-22 DIAGNOSIS — D631 Anemia in chronic kidney disease: Secondary | ICD-10-CM | POA: Diagnosis not present

## 2019-09-22 DIAGNOSIS — E11622 Type 2 diabetes mellitus with other skin ulcer: Secondary | ICD-10-CM | POA: Diagnosis not present

## 2019-09-22 DIAGNOSIS — L97422 Non-pressure chronic ulcer of left heel and midfoot with fat layer exposed: Secondary | ICD-10-CM | POA: Diagnosis not present

## 2019-09-22 DIAGNOSIS — E1122 Type 2 diabetes mellitus with diabetic chronic kidney disease: Secondary | ICD-10-CM | POA: Diagnosis not present

## 2019-09-22 DIAGNOSIS — N390 Urinary tract infection, site not specified: Secondary | ICD-10-CM | POA: Diagnosis not present

## 2019-09-22 DIAGNOSIS — N184 Chronic kidney disease, stage 4 (severe): Secondary | ICD-10-CM | POA: Diagnosis not present

## 2019-09-22 DIAGNOSIS — B952 Enterococcus as the cause of diseases classified elsewhere: Secondary | ICD-10-CM | POA: Diagnosis not present

## 2019-09-23 DIAGNOSIS — L97312 Non-pressure chronic ulcer of right ankle with fat layer exposed: Secondary | ICD-10-CM | POA: Diagnosis not present

## 2019-09-23 DIAGNOSIS — B952 Enterococcus as the cause of diseases classified elsewhere: Secondary | ICD-10-CM | POA: Diagnosis not present

## 2019-09-23 DIAGNOSIS — N184 Chronic kidney disease, stage 4 (severe): Secondary | ICD-10-CM | POA: Diagnosis not present

## 2019-09-23 DIAGNOSIS — D631 Anemia in chronic kidney disease: Secondary | ICD-10-CM | POA: Diagnosis not present

## 2019-09-23 DIAGNOSIS — N390 Urinary tract infection, site not specified: Secondary | ICD-10-CM | POA: Diagnosis not present

## 2019-09-23 DIAGNOSIS — L97422 Non-pressure chronic ulcer of left heel and midfoot with fat layer exposed: Secondary | ICD-10-CM | POA: Diagnosis not present

## 2019-09-23 DIAGNOSIS — E11621 Type 2 diabetes mellitus with foot ulcer: Secondary | ICD-10-CM | POA: Diagnosis not present

## 2019-09-23 DIAGNOSIS — E1122 Type 2 diabetes mellitus with diabetic chronic kidney disease: Secondary | ICD-10-CM | POA: Diagnosis not present

## 2019-09-23 DIAGNOSIS — E11622 Type 2 diabetes mellitus with other skin ulcer: Secondary | ICD-10-CM | POA: Diagnosis not present

## 2019-09-24 DIAGNOSIS — I129 Hypertensive chronic kidney disease with stage 1 through stage 4 chronic kidney disease, or unspecified chronic kidney disease: Secondary | ICD-10-CM | POA: Diagnosis not present

## 2019-09-24 DIAGNOSIS — E1122 Type 2 diabetes mellitus with diabetic chronic kidney disease: Secondary | ICD-10-CM | POA: Diagnosis not present

## 2019-09-24 DIAGNOSIS — L409 Psoriasis, unspecified: Secondary | ICD-10-CM | POA: Diagnosis not present

## 2019-09-24 DIAGNOSIS — E782 Mixed hyperlipidemia: Secondary | ICD-10-CM | POA: Diagnosis not present

## 2019-09-24 DIAGNOSIS — E1165 Type 2 diabetes mellitus with hyperglycemia: Secondary | ICD-10-CM | POA: Diagnosis not present

## 2019-09-24 DIAGNOSIS — D631 Anemia in chronic kidney disease: Secondary | ICD-10-CM | POA: Diagnosis not present

## 2019-09-24 DIAGNOSIS — I739 Peripheral vascular disease, unspecified: Secondary | ICD-10-CM | POA: Diagnosis not present

## 2019-09-24 DIAGNOSIS — M109 Gout, unspecified: Secondary | ICD-10-CM | POA: Diagnosis not present

## 2019-09-24 DIAGNOSIS — N184 Chronic kidney disease, stage 4 (severe): Secondary | ICD-10-CM | POA: Diagnosis not present

## 2019-09-25 DIAGNOSIS — E1122 Type 2 diabetes mellitus with diabetic chronic kidney disease: Secondary | ICD-10-CM | POA: Diagnosis not present

## 2019-09-25 DIAGNOSIS — L97521 Non-pressure chronic ulcer of other part of left foot limited to breakdown of skin: Secondary | ICD-10-CM | POA: Diagnosis not present

## 2019-09-25 DIAGNOSIS — E11620 Type 2 diabetes mellitus with diabetic dermatitis: Secondary | ICD-10-CM | POA: Diagnosis not present

## 2019-09-25 DIAGNOSIS — E11621 Type 2 diabetes mellitus with foot ulcer: Secondary | ICD-10-CM | POA: Diagnosis not present

## 2019-09-25 DIAGNOSIS — E11622 Type 2 diabetes mellitus with other skin ulcer: Secondary | ICD-10-CM | POA: Diagnosis not present

## 2019-09-25 DIAGNOSIS — I10 Essential (primary) hypertension: Secondary | ICD-10-CM | POA: Diagnosis not present

## 2019-09-25 DIAGNOSIS — D631 Anemia in chronic kidney disease: Secondary | ICD-10-CM | POA: Diagnosis not present

## 2019-09-25 DIAGNOSIS — S81801D Unspecified open wound, right lower leg, subsequent encounter: Secondary | ICD-10-CM | POA: Diagnosis not present

## 2019-09-25 DIAGNOSIS — N184 Chronic kidney disease, stage 4 (severe): Secondary | ICD-10-CM | POA: Diagnosis not present

## 2019-09-25 DIAGNOSIS — L97311 Non-pressure chronic ulcer of right ankle limited to breakdown of skin: Secondary | ICD-10-CM | POA: Diagnosis not present

## 2019-09-27 DIAGNOSIS — N184 Chronic kidney disease, stage 4 (severe): Secondary | ICD-10-CM | POA: Diagnosis not present

## 2019-09-27 DIAGNOSIS — E11621 Type 2 diabetes mellitus with foot ulcer: Secondary | ICD-10-CM | POA: Diagnosis not present

## 2019-09-27 DIAGNOSIS — E1122 Type 2 diabetes mellitus with diabetic chronic kidney disease: Secondary | ICD-10-CM | POA: Diagnosis not present

## 2019-09-27 DIAGNOSIS — L97311 Non-pressure chronic ulcer of right ankle limited to breakdown of skin: Secondary | ICD-10-CM | POA: Diagnosis not present

## 2019-09-27 DIAGNOSIS — E11622 Type 2 diabetes mellitus with other skin ulcer: Secondary | ICD-10-CM | POA: Diagnosis not present

## 2019-09-27 DIAGNOSIS — S81801D Unspecified open wound, right lower leg, subsequent encounter: Secondary | ICD-10-CM | POA: Diagnosis not present

## 2019-09-27 DIAGNOSIS — I10 Essential (primary) hypertension: Secondary | ICD-10-CM | POA: Diagnosis not present

## 2019-09-27 DIAGNOSIS — D631 Anemia in chronic kidney disease: Secondary | ICD-10-CM | POA: Diagnosis not present

## 2019-09-27 DIAGNOSIS — L97521 Non-pressure chronic ulcer of other part of left foot limited to breakdown of skin: Secondary | ICD-10-CM | POA: Diagnosis not present

## 2019-09-30 DIAGNOSIS — L89513 Pressure ulcer of right ankle, stage 3: Secondary | ICD-10-CM | POA: Diagnosis not present

## 2019-09-30 DIAGNOSIS — L97311 Non-pressure chronic ulcer of right ankle limited to breakdown of skin: Secondary | ICD-10-CM | POA: Diagnosis not present

## 2019-09-30 DIAGNOSIS — N184 Chronic kidney disease, stage 4 (severe): Secondary | ICD-10-CM | POA: Diagnosis not present

## 2019-09-30 DIAGNOSIS — E11621 Type 2 diabetes mellitus with foot ulcer: Secondary | ICD-10-CM | POA: Diagnosis not present

## 2019-09-30 DIAGNOSIS — L97521 Non-pressure chronic ulcer of other part of left foot limited to breakdown of skin: Secondary | ICD-10-CM | POA: Diagnosis not present

## 2019-09-30 DIAGNOSIS — E11622 Type 2 diabetes mellitus with other skin ulcer: Secondary | ICD-10-CM | POA: Diagnosis not present

## 2019-09-30 DIAGNOSIS — E1122 Type 2 diabetes mellitus with diabetic chronic kidney disease: Secondary | ICD-10-CM | POA: Diagnosis not present

## 2019-09-30 DIAGNOSIS — S81801D Unspecified open wound, right lower leg, subsequent encounter: Secondary | ICD-10-CM | POA: Diagnosis not present

## 2019-09-30 DIAGNOSIS — D631 Anemia in chronic kidney disease: Secondary | ICD-10-CM | POA: Diagnosis not present

## 2019-09-30 DIAGNOSIS — I10 Essential (primary) hypertension: Secondary | ICD-10-CM | POA: Diagnosis not present

## 2019-10-01 DIAGNOSIS — E1129 Type 2 diabetes mellitus with other diabetic kidney complication: Secondary | ICD-10-CM | POA: Diagnosis not present

## 2019-10-01 DIAGNOSIS — N189 Chronic kidney disease, unspecified: Secondary | ICD-10-CM | POA: Diagnosis not present

## 2019-10-01 DIAGNOSIS — Z1159 Encounter for screening for other viral diseases: Secondary | ICD-10-CM | POA: Diagnosis not present

## 2019-10-01 DIAGNOSIS — R809 Proteinuria, unspecified: Secondary | ICD-10-CM | POA: Diagnosis not present

## 2019-10-01 DIAGNOSIS — Z79899 Other long term (current) drug therapy: Secondary | ICD-10-CM | POA: Diagnosis not present

## 2019-10-01 DIAGNOSIS — D631 Anemia in chronic kidney disease: Secondary | ICD-10-CM | POA: Diagnosis not present

## 2019-10-01 DIAGNOSIS — N170 Acute kidney failure with tubular necrosis: Secondary | ICD-10-CM | POA: Diagnosis not present

## 2019-10-01 DIAGNOSIS — E559 Vitamin D deficiency, unspecified: Secondary | ICD-10-CM | POA: Diagnosis not present

## 2019-10-01 DIAGNOSIS — E1122 Type 2 diabetes mellitus with diabetic chronic kidney disease: Secondary | ICD-10-CM | POA: Diagnosis not present

## 2019-10-04 DIAGNOSIS — D631 Anemia in chronic kidney disease: Secondary | ICD-10-CM | POA: Diagnosis not present

## 2019-10-04 DIAGNOSIS — E11622 Type 2 diabetes mellitus with other skin ulcer: Secondary | ICD-10-CM | POA: Diagnosis not present

## 2019-10-04 DIAGNOSIS — N184 Chronic kidney disease, stage 4 (severe): Secondary | ICD-10-CM | POA: Diagnosis not present

## 2019-10-04 DIAGNOSIS — L97521 Non-pressure chronic ulcer of other part of left foot limited to breakdown of skin: Secondary | ICD-10-CM | POA: Diagnosis not present

## 2019-10-04 DIAGNOSIS — I10 Essential (primary) hypertension: Secondary | ICD-10-CM | POA: Diagnosis not present

## 2019-10-04 DIAGNOSIS — E1122 Type 2 diabetes mellitus with diabetic chronic kidney disease: Secondary | ICD-10-CM | POA: Diagnosis not present

## 2019-10-04 DIAGNOSIS — S81801D Unspecified open wound, right lower leg, subsequent encounter: Secondary | ICD-10-CM | POA: Diagnosis not present

## 2019-10-04 DIAGNOSIS — E11621 Type 2 diabetes mellitus with foot ulcer: Secondary | ICD-10-CM | POA: Diagnosis not present

## 2019-10-04 DIAGNOSIS — L97311 Non-pressure chronic ulcer of right ankle limited to breakdown of skin: Secondary | ICD-10-CM | POA: Diagnosis not present

## 2019-10-08 ENCOUNTER — Other Ambulatory Visit: Payer: Self-pay

## 2019-10-08 ENCOUNTER — Ambulatory Visit (INDEPENDENT_AMBULATORY_CARE_PROVIDER_SITE_OTHER): Payer: Medicare HMO

## 2019-10-08 VITALS — Temp 96.8°F

## 2019-10-08 DIAGNOSIS — N312 Flaccid neuropathic bladder, not elsewhere classified: Secondary | ICD-10-CM

## 2019-10-08 NOTE — Progress Notes (Signed)
Cath Change/ Replacement  Patient is present today for a catheter change due to urinary retention.  7ml of water was removed from the balloon, a 16FR foley cath was removed with out difficulty.  Patient was cleaned and prepped in a sterile fashion with betadine. A 16 FR foley cath was replaced into the bladder no complications were noted Urine return was noted 1143ml and urine was yellow in color. The balloon was filled with 62ml of sterile water. A bedside bag was attached for drainage.  A leg bag was also given to the patient and patient was given instruction on how to change from one bag to another. Patient was given proper instruction on catheter care.    Performed by: Arissa Fagin LPN   Follow up: Keep next scheduled office visit

## 2019-10-09 DIAGNOSIS — N189 Chronic kidney disease, unspecified: Secondary | ICD-10-CM | POA: Diagnosis not present

## 2019-10-09 DIAGNOSIS — N170 Acute kidney failure with tubular necrosis: Secondary | ICD-10-CM | POA: Diagnosis not present

## 2019-10-09 DIAGNOSIS — E559 Vitamin D deficiency, unspecified: Secondary | ICD-10-CM | POA: Diagnosis not present

## 2019-10-09 DIAGNOSIS — R809 Proteinuria, unspecified: Secondary | ICD-10-CM | POA: Diagnosis not present

## 2019-10-09 DIAGNOSIS — E1129 Type 2 diabetes mellitus with other diabetic kidney complication: Secondary | ICD-10-CM | POA: Diagnosis not present

## 2019-10-09 DIAGNOSIS — E1122 Type 2 diabetes mellitus with diabetic chronic kidney disease: Secondary | ICD-10-CM | POA: Diagnosis not present

## 2019-10-09 DIAGNOSIS — E211 Secondary hyperparathyroidism, not elsewhere classified: Secondary | ICD-10-CM | POA: Diagnosis not present

## 2019-10-09 DIAGNOSIS — D631 Anemia in chronic kidney disease: Secondary | ICD-10-CM | POA: Diagnosis not present

## 2019-10-09 DIAGNOSIS — Z79899 Other long term (current) drug therapy: Secondary | ICD-10-CM | POA: Diagnosis not present

## 2019-10-10 ENCOUNTER — Other Ambulatory Visit: Payer: Self-pay

## 2019-10-10 ENCOUNTER — Encounter (HOSPITAL_COMMUNITY)
Admission: RE | Admit: 2019-10-10 | Discharge: 2019-10-10 | Disposition: A | Discharge status: Home / Self Care | Payer: Medicare HMO | Source: Ambulatory Visit | Attending: Nephrology | Admitting: Nephrology

## 2019-10-10 ENCOUNTER — Encounter (HOSPITAL_COMMUNITY): Payer: Self-pay

## 2019-10-10 DIAGNOSIS — N184 Chronic kidney disease, stage 4 (severe): Secondary | ICD-10-CM | POA: Diagnosis not present

## 2019-10-10 DIAGNOSIS — D631 Anemia in chronic kidney disease: Secondary | ICD-10-CM | POA: Diagnosis not present

## 2019-10-10 LAB — POCT HEMOGLOBIN-HEMACUE: Hemoglobin: 8.7 g/dL — ABNORMAL LOW (ref 13.0–17.0)

## 2019-10-10 MED ORDER — EPOETIN ALFA-EPBX 4000 UNIT/ML IJ SOLN
4000.0000 [IU] | Freq: Once | INTRAMUSCULAR | Status: AC
  Administered 2019-10-10: 10:00:00 4000 [IU] via SUBCUTANEOUS
  Filled 2019-10-10: qty 1

## 2019-10-14 DIAGNOSIS — L97528 Non-pressure chronic ulcer of other part of left foot with other specified severity: Secondary | ICD-10-CM | POA: Diagnosis not present

## 2019-10-14 DIAGNOSIS — L97519 Non-pressure chronic ulcer of other part of right foot with unspecified severity: Secondary | ICD-10-CM | POA: Diagnosis not present

## 2019-10-14 DIAGNOSIS — L97328 Non-pressure chronic ulcer of left ankle with other specified severity: Secondary | ICD-10-CM | POA: Diagnosis not present

## 2019-10-14 DIAGNOSIS — I10 Essential (primary) hypertension: Secondary | ICD-10-CM | POA: Diagnosis not present

## 2019-10-14 DIAGNOSIS — L97529 Non-pressure chronic ulcer of other part of left foot with unspecified severity: Secondary | ICD-10-CM | POA: Diagnosis not present

## 2019-10-14 DIAGNOSIS — Z881 Allergy status to other antibiotic agents status: Secondary | ICD-10-CM | POA: Diagnosis not present

## 2019-10-14 DIAGNOSIS — N400 Enlarged prostate without lower urinary tract symptoms: Secondary | ICD-10-CM | POA: Diagnosis not present

## 2019-10-14 DIAGNOSIS — E7800 Pure hypercholesterolemia, unspecified: Secondary | ICD-10-CM | POA: Diagnosis not present

## 2019-10-14 DIAGNOSIS — E11621 Type 2 diabetes mellitus with foot ulcer: Secondary | ICD-10-CM | POA: Diagnosis not present

## 2019-10-14 DIAGNOSIS — E11622 Type 2 diabetes mellitus with other skin ulcer: Secondary | ICD-10-CM | POA: Diagnosis not present

## 2019-10-14 DIAGNOSIS — E669 Obesity, unspecified: Secondary | ICD-10-CM | POA: Diagnosis not present

## 2019-10-14 DIAGNOSIS — M25571 Pain in right ankle and joints of right foot: Secondary | ICD-10-CM | POA: Diagnosis not present

## 2019-10-24 ENCOUNTER — Encounter (HOSPITAL_COMMUNITY): Payer: Self-pay

## 2019-10-24 ENCOUNTER — Encounter (HOSPITAL_COMMUNITY)
Admission: RE | Admit: 2019-10-24 | Discharge: 2019-10-24 | Disposition: A | Discharge status: Home / Self Care | Payer: Medicare HMO | Source: Ambulatory Visit | Attending: Nephrology | Admitting: Nephrology

## 2019-10-24 ENCOUNTER — Other Ambulatory Visit: Payer: Self-pay

## 2019-10-24 DIAGNOSIS — N184 Chronic kidney disease, stage 4 (severe): Secondary | ICD-10-CM | POA: Diagnosis not present

## 2019-10-24 DIAGNOSIS — D631 Anemia in chronic kidney disease: Secondary | ICD-10-CM | POA: Diagnosis not present

## 2019-10-24 LAB — POCT HEMOGLOBIN-HEMACUE: Hemoglobin: 8.7 g/dL — ABNORMAL LOW (ref 13.0–17.0)

## 2019-10-24 MED ORDER — EPOETIN ALFA-EPBX 4000 UNIT/ML IJ SOLN
INTRAMUSCULAR | Status: AC
  Filled 2019-10-24: qty 1

## 2019-10-24 MED ORDER — EPOETIN ALFA-EPBX 4000 UNIT/ML IJ SOLN
4000.0000 [IU] | Freq: Once | INTRAMUSCULAR | Status: AC
  Administered 2019-10-24: 4000 [IU] via SUBCUTANEOUS

## 2019-10-29 ENCOUNTER — Ambulatory Visit: Payer: Medicare HMO | Admitting: Urology

## 2019-11-03 ENCOUNTER — Encounter: Payer: Self-pay | Admitting: Urology

## 2019-11-03 ENCOUNTER — Other Ambulatory Visit: Payer: Self-pay

## 2019-11-03 ENCOUNTER — Ambulatory Visit (INDEPENDENT_AMBULATORY_CARE_PROVIDER_SITE_OTHER): Payer: Medicare HMO | Admitting: Urology

## 2019-11-03 VITALS — BP 155/74 | HR 77 | Temp 98.2°F | Ht 70.0 in | Wt 285.0 lb

## 2019-11-03 DIAGNOSIS — R339 Retention of urine, unspecified: Secondary | ICD-10-CM | POA: Diagnosis not present

## 2019-11-03 NOTE — Progress Notes (Signed)
Urological Symptom Review  Patient is experiencing the following symptoms: none   Review of Systems  Gastrointestinal (upper)  : Negative for upper GI symptoms  Gastrointestinal (lower) : Negative for lower GI symptoms  Constitutional : Negative for symptoms  Skin: Negative for skin symptoms  Eyes: Negative for eye symptoms  Ear/Nose/Throat : Sinus problems  Hematologic/Lymphatic: Negative for Hematologic/Lymphatic symptoms  Cardiovascular : Leg swelling  Respiratory : Shortness of breath  Endocrine: Excessive thirst  Musculoskeletal: Back pain  Neurological: Negative for neurological symptoms  Psychologic: Negative for psychiatric symptoms   Cath Change/ Replacement  Patient is present today for a catheter change due to urinary retention.  40ml of water was removed from the balloon, a 16FR foley cath was removed with out difficulty.  Patient was cleaned and prepped in a sterile fashion with betadine. A 16 FR foley cath was replaced into the bladder no complications were noted Urine return was noted 41ml and urine was yellow in color. The balloon was filled with 46ml of sterile water. A leg bag was attached for drainage.  A night bag was also given to the patient and patient was given instruction on how to change from one bag to another. Patient was given proper instruction on catheter care.    Performed by: Slayter Moorhouse LPN  Follow up:  4 week NV

## 2019-11-03 NOTE — Progress Notes (Signed)
11/03/2019 9:55 AM   Danny [name] December 17, 1955 [id number]  Referring provider: Celene Squibb, MD [address] [name],  [place] [address]  Urinary retention  HPI: Danny [name] is a [phone] here for followup for urinary retention. Patient has been seen previously for OAB and urinary incontinence. He had a foley placed 6 months ago and then had issues with the foley and CIC teaching was attempted. The patient was unable to perform CIC and foley was once again placed. The patient has severe LUTS on medical therapy.     PMH: Past Medical History:  Diagnosis Date  . Arthritis   . Diabetes mellitus without complication ([place])   . Foot ulcer ([place])   . Hypertension   . Urinary retention     Surgical History: Past Surgical History:  Procedure Laterality Date  . ANKLE SURGERY    . CHOLECYSTECTOMY    . FOOT SURGERY      Home Medications:  Allergies as of 11/03/2019      Reactions   Dust Mite Extract Itching, Other (See Comments)   Unknown reaction-potential shortness of breath   Prednisone Nausea And Vomiting   Rocephin [ceftriaxone] Nausea And Vomiting      Medication List       Accurate as of Nov 03, 2019  9:55 AM. If you have any questions, ask your nurse or doctor.        Accu-Chek SmartView test strip Generic drug: glucose blood 1 each by Other route as needed.   acetaminophen 325 MG tablet Commonly known as: TYLENOL Take 650 mg by mouth as needed for mild pain or moderate pain.   allopurinol 300 MG tablet Commonly known as: ZYLOPRIM Take 1 tablet (300 mg total) by mouth daily.   amLODipine 5 MG tablet Commonly known as: NORVASC Take 1 tablet (5 mg total) by mouth at bedtime.   amLODipine 10 MG tablet Commonly known as: NORVASC   amoxicillin-clavulanate 875-125 MG tablet Commonly known as: AUGMENTIN   ascorbic acid 500 MG tablet Commonly known as: VITAMIN C Take 1,000 mg by mouth daily.   Brodalumab 210 MG/1.5ML Sosy Inject 210 mg into the skin every 14  (fourteen) days.   Darbepoetin Alfa 150 MCG/0.3ML Sosy injection Commonly known as: ARANESP Inject 0.3 mLs (150 mcg total) into the skin every Saturday at 6 PM.   Droplet Insulin Syringe 31G X 5/16" 1 ML Misc Generic drug: Insulin Syringe-Needle U-100   ECZEMA MOISTURIZING EX Apply 1 application topically daily as needed (applied to legs).   feeding supplement (PRO-STAT SUGAR FREE 64) Liqd Take 30 mLs by mouth 2 (two) times daily between meals.   Fish Oil 1000 MG Caps Take by mouth.   fluticasone 50 MCG/ACT nasal spray Commonly known as: FLONASE Place 2 sprays into both nostrils daily.   folic acid 1 MG tablet Commonly known as: FOLVITE Take 1 tablet (1 mg total) by mouth daily.   guaiFENesin 600 MG 12 hr tablet Commonly known as: MUCINEX Take 1 tablet (600 mg total) by mouth 2 (two) times daily.   insulin aspart protamine- aspart (70-30) 100 UNIT/ML injection Commonly known as: NOVOLOG MIX 70/30 Inject 0.08 mLs (8 Units total) into the skin 3 (three) times daily.   Melatonin 1 MG Caps Take 1 mg by mouth as needed. For insomnia   metoprolol succinate 100 MG 24 hr tablet Commonly known as: TOPROL-XL Take 1 tablet (100 mg total) by mouth every evening.   mirabegron ER 25 MG Tb24 tablet  Commonly known as: MYRBETRIQ Take 1 tablet (25 mg total) by mouth daily.   NovoLIN 70/30 ReliOn (70-30) 100 UNIT/ML injection Generic drug: insulin NPH-regular Human   nystatin powder Generic drug: nystatin   pravastatin 80 MG tablet Commonly known as: PRAVACHOL Take 1 tablet (80 mg total) by mouth every evening.   Santyl ointment Generic drug: collagenase Ointment; 250 unit/gram; topical  Special Instructions: Apply to wounds per treatment orders.   silver nitrate applicators 94-17 % applicator ; topical  Special Instructions: Apply to rolled borders of wound to left plantar foot daily as needed for epithelized borders. Once A Day - PRN PRN 1     sulfamethoxazole-trimethoprim 800-160 MG tablet Commonly known as: BACTRIM DS   tamsulosin 0.4 MG Caps capsule Commonly known as: FLOMAX Take 1 capsule (0.4 mg total) by mouth daily after supper.   torsemide 20 MG tablet Commonly known as: DEMADEX Take 2 tablets (40 mg total) by mouth 2 (two) times daily.       Allergies:  Allergies  Allergen Reactions  . Dust Mite Extract Itching and Other (See Comments)    Unknown reaction-potential shortness of breath  . Prednisone Nausea And Vomiting  . Rocephin [Ceftriaxone] Nausea And Vomiting    Family History: No family history on file.  Social History:  reports that he has never smoked. He has never used smokeless tobacco. He reports that he does not drink alcohol or use drugs.  ROS: All other review of systems were reviewed and are negative except what is noted above in HPI  Physical Exam: BP (!) 155/74   Pulse 77   Temp 98.2 F (36.8 C)   Ht 5\' 10"  (1.778 m)   Wt 285 lb (129.3 kg)   BMI 40.89 kg/m   Constitutional:  Alert and oriented, No acute distress. HEENT: [place] AT, moist mucus membranes.  Trachea midline, no masses. Cardiovascular: No clubbing, cyanosis, or edema. Respiratory: Normal respiratory effort, no increased work of breathing. GI: Abdomen is soft, nontender, nondistended, no abdominal masses GU: No CVA tenderness.  Lymph: No cervical or inguinal lymphadenopathy. Skin: No rashes, bruises or suspicious lesions. Neurologic: Grossly intact, no focal deficits, moving all 4 extremities. Psychiatric: Normal mood and affect.  Laboratory Data: Lab Results  Component Value Date   WBC 8.2 07/15/2019   HGB 8.7 (L) 10/24/2019   HCT 24.3 (L) 07/15/2019   MCV 93.1 07/15/2019   PLT 164 07/15/2019    Lab Results  Component Value Date   CREATININE 4.26 (H) 07/16/2019    No results found for: PSA  No results found for: TESTOSTERONE  Lab Results  Component Value Date   HGBA1C 7.0 (H) 07/07/2019     Urinalysis    Component Value Date/Time   COLORURINE YELLOW 07/07/2019 1644   APPEARANCEUR CLOUDY (A) 07/07/2019 1644   LABSPEC 1.010 07/07/2019 1644   PHURINE 5.0 07/07/2019 1644   GLUCOSEU NEGATIVE 07/07/2019 1644   HGBUR SMALL (A) 07/07/2019 [place] 07/07/2019 [place] 07/07/2019 1644   PROTEINUR 100 (A) 07/07/2019 1644   NITRITE NEGATIVE 07/07/2019 1644   LEUKOCYTESUR MODERATE (A) 07/07/2019 1644    Lab Results  Component Value Date   BACTERIA MANY (A) 07/07/2019    Pertinent Imaging:  No results found for this or any previous visit. No results found for this or any previous visit. No results found for this or any previous visit. No results found for this or any previous visit. Results for orders placed  during the hospital encounter of 07/07/19  US RENAL   Narrative CLINICAL DATA:  Acute kidney injury.  EXAM: RENAL / URINARY TRACT ULTRASOUND COMPLETE  COMPARISON:  Renal ultrasound 02/24/2019  FINDINGS: Right Kidney:  Renal measurements: 10.9 x 5.4 x 5.8 cm = volume: 182 mL. No hydronephrosis. Mildly increased renal cortical echogenicity. Redemonstrated exophytic simple appearing interpolar cyst measuring 2.4 x 2.4 x 2.5 cm.  Left Kidney:  Renal measurements: 11.8 x 6.1 x 4.9 cm = volume: 184 mL. No hydronephrosis. Mildly increased renal cortical echogenicity.  Bladder:  The bladder is collapsed around a Foley catheter.  Other:  Small volume pelvic ascites.  IMPRESSION: Increased renal cortical echogenicity bilaterally, suggestive of chronic renal parenchymal disease.  2.5 cm simple appearing right renal cyst.  No hydronephrosis.  Bladder collapsed around Foley catheter.  Small volume pelvic ascites.   Electronically Signed   By: Kellie Simmering DO   On: 07/08/2019 09:25    No results found for this or any previous visit. No results found for this or any previous visit. No results found for this or  any previous visit.  Assessment & Plan:    1. Urinary retention -schedule for Urodynamics   No follow-ups on file.  Nicolette Bang, MD  [facility] [place]

## 2019-11-04 DIAGNOSIS — L97312 Non-pressure chronic ulcer of right ankle with fat layer exposed: Secondary | ICD-10-CM | POA: Diagnosis not present

## 2019-11-04 DIAGNOSIS — E11622 Type 2 diabetes mellitus with other skin ulcer: Secondary | ICD-10-CM | POA: Diagnosis not present

## 2019-11-04 DIAGNOSIS — E11621 Type 2 diabetes mellitus with foot ulcer: Secondary | ICD-10-CM | POA: Diagnosis not present

## 2019-11-04 DIAGNOSIS — L97919 Non-pressure chronic ulcer of unspecified part of right lower leg with unspecified severity: Secondary | ICD-10-CM | POA: Diagnosis not present

## 2019-11-04 DIAGNOSIS — L97328 Non-pressure chronic ulcer of left ankle with other specified severity: Secondary | ICD-10-CM | POA: Diagnosis not present

## 2019-11-07 ENCOUNTER — Encounter (HOSPITAL_COMMUNITY)
Admission: RE | Admit: 2019-11-07 | Discharge: 2019-11-07 | Disposition: A | Discharge status: Home / Self Care | Payer: Medicare HMO | Source: Ambulatory Visit | Attending: Nephrology | Admitting: Nephrology

## 2019-11-07 ENCOUNTER — Other Ambulatory Visit: Payer: Self-pay

## 2019-11-07 DIAGNOSIS — N184 Chronic kidney disease, stage 4 (severe): Secondary | ICD-10-CM | POA: Insufficient documentation

## 2019-11-07 DIAGNOSIS — D631 Anemia in chronic kidney disease: Secondary | ICD-10-CM | POA: Diagnosis not present

## 2019-11-07 LAB — POCT HEMOGLOBIN-HEMACUE: Hemoglobin: 8.8 g/dL — ABNORMAL LOW (ref 13.0–17.0)

## 2019-11-07 MED ORDER — EPOETIN ALFA-EPBX 4000 UNIT/ML IJ SOLN
4000.0000 [IU] | Freq: Once | INTRAMUSCULAR | Status: AC
  Administered 2019-11-07: 4000 [IU] via SUBCUTANEOUS

## 2019-11-07 MED ORDER — EPOETIN ALFA-EPBX 4000 UNIT/ML IJ SOLN
INTRAMUSCULAR | Status: AC
  Filled 2019-11-07: qty 1

## 2019-11-21 ENCOUNTER — Other Ambulatory Visit (HOSPITAL_COMMUNITY): Payer: Medicare HMO

## 2019-11-21 ENCOUNTER — Encounter (HOSPITAL_COMMUNITY)
Admission: RE | Admit: 2019-11-21 | Discharge: 2019-11-21 | Disposition: A | Discharge status: Home / Self Care | Payer: Medicare HMO | Source: Ambulatory Visit | Attending: Nephrology | Admitting: Nephrology

## 2019-11-21 ENCOUNTER — Other Ambulatory Visit: Payer: Self-pay

## 2019-11-21 ENCOUNTER — Encounter (HOSPITAL_COMMUNITY): Payer: Self-pay

## 2019-11-21 DIAGNOSIS — N184 Chronic kidney disease, stage 4 (severe): Secondary | ICD-10-CM | POA: Diagnosis not present

## 2019-11-21 DIAGNOSIS — D631 Anemia in chronic kidney disease: Secondary | ICD-10-CM | POA: Diagnosis not present

## 2019-11-21 LAB — RENAL FUNCTION PANEL
Albumin: 3.1 g/dL — ABNORMAL LOW (ref 3.5–5.0)
Anion gap: 11 (ref 5–15)
BUN: 48 mg/dL — ABNORMAL HIGH (ref 8–23)
CO2: 23 mmol/L (ref 22–32)
Calcium: 8.9 mg/dL (ref 8.9–10.3)
Chloride: 103 mmol/L (ref 98–111)
Creatinine, Ser: 3.22 mg/dL — ABNORMAL HIGH (ref 0.61–1.24)
GFR calc Af Amer: 22 mL/min — ABNORMAL LOW (ref >60)
GFR calc non Af Amer: 19 mL/min — ABNORMAL LOW (ref >60)
Glucose, Bld: 176 mg/dL — ABNORMAL HIGH (ref 70–99)
Phosphorus: 4.5 mg/dL (ref 2.5–4.6)
Potassium: 4.2 mmol/L (ref 3.5–5.1)
Sodium: 137 mmol/L (ref 135–145)

## 2019-11-21 LAB — CBC
HCT: 29.4 % — ABNORMAL LOW (ref 39.0–52.0)
Hemoglobin: 9.6 g/dL — ABNORMAL LOW (ref 13.0–17.0)
MCH: 29.6 pg (ref 26.0–34.0)
MCHC: 32.7 g/dL (ref 30.0–36.0)
MCV: 90.7 fL (ref 80.0–100.0)
Platelets: 179 10*3/uL (ref 150–400)
RBC: 3.24 MIL/uL — ABNORMAL LOW (ref 4.22–5.81)
RDW: 16 % — ABNORMAL HIGH (ref 11.5–15.5)
WBC: 6.4 10*3/uL (ref 4.0–10.5)
nRBC: 0 % (ref 0.0–0.2)

## 2019-11-21 LAB — PROTEIN / CREATININE RATIO, URINE
Creatinine, Urine: 65.28 mg/dL
Protein Creatinine Ratio: 2.86 mg/mg{Cre} — ABNORMAL HIGH (ref 0.00–0.15)
Total Protein, Urine: 187 mg/dL

## 2019-11-21 LAB — POCT HEMOGLOBIN-HEMACUE: Hemoglobin: 9.9 g/dL — ABNORMAL LOW (ref 13.0–17.0)

## 2019-11-21 MED ORDER — EPOETIN ALFA-EPBX 4000 UNIT/ML IJ SOLN
INTRAMUSCULAR | Status: AC
  Filled 2019-11-21: qty 1

## 2019-11-21 MED ORDER — EPOETIN ALFA-EPBX 4000 UNIT/ML IJ SOLN
4000.0000 [IU] | Freq: Once | INTRAMUSCULAR | Status: AC
  Administered 2019-11-21: 4000 [IU] via SUBCUTANEOUS

## 2019-11-21 NOTE — Progress Notes (Signed)
Danny [name] labs from 11/21/2019 Results for [name], [name] (MRN [id number]) as of 11/21/2019 15:36     Ref. Range 11/21/2019 09:59 11/21/2019 10:00 11/21/2019 10:08  Sodium Latest Ref Range: 135 - 145 mmol/L 137    Potassium Latest Ref Range: 3.5 - 5.1 mmol/L 4.2    Chloride Latest Ref Range: 98 - 111 mmol/L 103    CO2 Latest Ref Range: 22 - 32 mmol/L 23    Glucose Latest Ref Range: 70 - 99 mg/dL 176 (H)    BUN Latest Ref Range: 8 - 23 mg/dL 48 (H)    Creatinine Latest Ref Range: 0.61 - 1.24 mg/dL 3.22 (H)    Calcium Latest Ref Range: 8.9 - 10.3 mg/dL 8.9    Anion gap Latest Ref Range: 5 - 15  11    Phosphorus Latest Ref Range: 2.5 - 4.6 mg/dL 4.5    Albumin Latest Ref Range: 3.5 - 5.0 g/dL 3.1 (L)    GFR, Est Non African American Latest Ref Range: >60 mL/min 19 (L)    GFR, Est African American Latest Ref Range: >60 mL/min 22 (L)    WBC Latest Ref Range: 4.0 - 10.5 K/uL 6.4    RBC Latest Ref Range: 4.22 - 5.81 MIL/uL 3.24 (L)    Hemoglobin Latest Ref Range: 13.0 - 17.0 g/dL 9.6 (L)  9.9 (L)  HCT Latest Ref Range: 39.0 - 52.0 % 29.4 (L)    MCV Latest Ref Range: 80.0 - 100.0 fL 90.7    MCH Latest Ref Range: 26.0 - 34.0 pg 29.6    MCHC Latest Ref Range: 30.0 - 36.0 g/dL 32.7    RDW Latest Ref Range: 11.5 - 15.5 % 16.0 (H)    Platelets Latest Ref Range: 150 - 400 K/uL 179    nRBC Latest Ref Range: 0.0 - 0.2 % 0.0    Total Protein, Urine Latest Units: mg/dL  187   Protein Creatinine Ratio Latest Ref Range: 0.00 - 0.15 mg/mgCre  2.86 (H)   Creatinine, Urine Latest Units: mg/dL  65.28

## 2019-11-25 DIAGNOSIS — R338 Other retention of urine: Secondary | ICD-10-CM | POA: Diagnosis not present

## 2019-11-27 DIAGNOSIS — D631 Anemia in chronic kidney disease: Secondary | ICD-10-CM | POA: Diagnosis not present

## 2019-11-27 DIAGNOSIS — E1122 Type 2 diabetes mellitus with diabetic chronic kidney disease: Secondary | ICD-10-CM | POA: Diagnosis not present

## 2019-11-27 DIAGNOSIS — R809 Proteinuria, unspecified: Secondary | ICD-10-CM | POA: Diagnosis not present

## 2019-11-27 DIAGNOSIS — E211 Secondary hyperparathyroidism, not elsewhere classified: Secondary | ICD-10-CM | POA: Diagnosis not present

## 2019-11-27 DIAGNOSIS — N170 Acute kidney failure with tubular necrosis: Secondary | ICD-10-CM | POA: Diagnosis not present

## 2019-11-27 DIAGNOSIS — I5032 Chronic diastolic (congestive) heart failure: Secondary | ICD-10-CM | POA: Diagnosis not present

## 2019-11-27 DIAGNOSIS — E1129 Type 2 diabetes mellitus with other diabetic kidney complication: Secondary | ICD-10-CM | POA: Diagnosis not present

## 2019-11-27 DIAGNOSIS — N189 Chronic kidney disease, unspecified: Secondary | ICD-10-CM | POA: Diagnosis not present

## 2019-12-01 ENCOUNTER — Ambulatory Visit: Payer: Medicare HMO

## 2019-12-02 ENCOUNTER — Ambulatory Visit: Payer: Medicare HMO

## 2019-12-03 ENCOUNTER — Ambulatory Visit: Payer: Medicare HMO

## 2019-12-05 ENCOUNTER — Encounter (HOSPITAL_COMMUNITY)
Admission: RE | Admit: 2019-12-05 | Discharge: 2019-12-05 | Disposition: A | Discharge status: Home / Self Care | Payer: Medicare HMO | Source: Ambulatory Visit | Attending: Nephrology | Admitting: Nephrology

## 2019-12-05 ENCOUNTER — Encounter (HOSPITAL_COMMUNITY): Payer: Self-pay

## 2019-12-05 ENCOUNTER — Other Ambulatory Visit: Payer: Self-pay

## 2019-12-05 DIAGNOSIS — N184 Chronic kidney disease, stage 4 (severe): Secondary | ICD-10-CM | POA: Diagnosis not present

## 2019-12-05 DIAGNOSIS — D631 Anemia in chronic kidney disease: Secondary | ICD-10-CM | POA: Insufficient documentation

## 2019-12-05 HISTORY — DX: Anemia, unspecified: D64.9

## 2019-12-05 HISTORY — DX: Chronic kidney disease, stage 4 (severe): N18.4

## 2019-12-05 LAB — POCT HEMOGLOBIN-HEMACUE: Hemoglobin: 9.4 g/dL — ABNORMAL LOW (ref 13.0–17.0)

## 2019-12-05 MED ORDER — EPOETIN ALFA-EPBX 4000 UNIT/ML IJ SOLN
4000.0000 [IU] | Freq: Once | INTRAMUSCULAR | Status: AC
  Administered 2019-12-05: 4000 [IU] via SUBCUTANEOUS
  Filled 2019-12-05: qty 1

## 2019-12-09 DIAGNOSIS — E11621 Type 2 diabetes mellitus with foot ulcer: Secondary | ICD-10-CM | POA: Diagnosis not present

## 2019-12-09 DIAGNOSIS — Z87891 Personal history of nicotine dependence: Secondary | ICD-10-CM | POA: Diagnosis not present

## 2019-12-09 DIAGNOSIS — I509 Heart failure, unspecified: Secondary | ICD-10-CM | POA: Diagnosis not present

## 2019-12-09 DIAGNOSIS — I130 Hypertensive heart and chronic kidney disease with heart failure and stage 1 through stage 4 chronic kidney disease, or unspecified chronic kidney disease: Secondary | ICD-10-CM | POA: Diagnosis not present

## 2019-12-09 DIAGNOSIS — Z794 Long term (current) use of insulin: Secondary | ICD-10-CM | POA: Diagnosis not present

## 2019-12-09 DIAGNOSIS — E11622 Type 2 diabetes mellitus with other skin ulcer: Secondary | ICD-10-CM | POA: Diagnosis not present

## 2019-12-09 DIAGNOSIS — L97309 Non-pressure chronic ulcer of unspecified ankle with unspecified severity: Secondary | ICD-10-CM | POA: Diagnosis not present

## 2019-12-09 DIAGNOSIS — L97519 Non-pressure chronic ulcer of other part of right foot with unspecified severity: Secondary | ICD-10-CM | POA: Diagnosis not present

## 2019-12-09 DIAGNOSIS — N184 Chronic kidney disease, stage 4 (severe): Secondary | ICD-10-CM | POA: Diagnosis not present

## 2019-12-09 DIAGNOSIS — E1122 Type 2 diabetes mellitus with diabetic chronic kidney disease: Secondary | ICD-10-CM | POA: Diagnosis not present

## 2019-12-09 DIAGNOSIS — E1140 Type 2 diabetes mellitus with diabetic neuropathy, unspecified: Secondary | ICD-10-CM | POA: Diagnosis not present

## 2019-12-10 ENCOUNTER — Ambulatory Visit (INDEPENDENT_AMBULATORY_CARE_PROVIDER_SITE_OTHER): Payer: Medicare HMO | Admitting: Urology

## 2019-12-10 ENCOUNTER — Encounter: Payer: Self-pay | Admitting: Urology

## 2019-12-10 ENCOUNTER — Other Ambulatory Visit: Payer: Self-pay

## 2019-12-10 VITALS — BP 157/73 | HR 73 | Temp 98.2°F | Ht 73.0 in | Wt 290.0 lb

## 2019-12-10 DIAGNOSIS — R339 Retention of urine, unspecified: Secondary | ICD-10-CM

## 2019-12-10 MED ORDER — CIPROFLOXACIN HCL 500 MG PO TABS
500.0000 mg | ORAL_TABLET | Freq: Once | ORAL | Status: AC
  Administered 2019-12-10: 500 mg via ORAL

## 2019-12-10 NOTE — Patient Instructions (Signed)
Transurethral Resection of the Prostate Transurethral resection of the prostate (TURP) is the removal (resection) of part of the gland that produces semen (prostate gland). This procedure is done to treat benign prostatic hyperplasia (BPH). BPH is an abnormal, noncancerous (benign) increase in the number of cells that make up the prostate tissue. BPH causes the prostate to get bigger. The enlarged prostate can push against or block the tube that drains urine from the bladder out of the body (urethra). BPH can affect normal urine flow by causing bladder infections, difficulty controlling bladder function, and difficulty emptying the bladder.  The goal of TURP is to remove enough prostate tissue to allow for a normal flow of urine. The procedure will allow you to empty your bladder more completely when you urinate so that you can urinate less often. In a transurethral resection, a thin telescope with a light, a tiny camera, and an electric cutting edge (resectoscope) is passed through the urethra and into the prostate. The opening of the urethra is at the end of the penis. Tell a health care provider about:  Any allergies you have.  All medicines you are taking, including vitamins, herbs, eye drops, creams, and over-the-counter medicines.  Any problems you or family members have had with anesthetic medicines.  Any blood disorders you have.  Any surgeries you have had.  Any medical conditions you have.  Any prostate infections you have had. What are the risks? Generally, this is a safe procedure. However, problems may occur, including:  Infection.  Bleeding.  Allergic reactions to medicines.  Damage to other structures or organs, such as: ? The urethra. ? The bladder. ? Muscles that surround the prostate.  Difficulty getting an erection.  Inability to control when you urinate (incontinence).  Scarring, which may cause problems with urine flow. What happens before the  procedure? Medicines Ask your health care provider about:  Changing or stopping your regular medicines. This is especially important if you are taking diabetes medicines or blood thinners.  Taking medicines such as aspirin and ibuprofen. These medicines can thin your blood. Do not take these medicines unless your health care provider tells you to take them.  Taking over-the-counter medicines, vitamins, herbs, and supplements. Eating and drinking Follow instructions from your health care provider about eating and drinking, which may include:  8 hours before the procedure - stop eating heavy meals or foods, such as meat, fried foods, or fatty foods.  6 hours before the procedure - stop eating light meals or foods, such as toast or cereal.  6 hours before the procedure - stop drinking milk or drinks that contain milk.  2 hours before the procedure - stop drinking clear liquids. Staying hydrated Follow instructions from your health care provider about hydration, which may include:  Up to 2 hours before the procedure - you may continue to drink clear liquids, such as water, clear fruit juice, black coffee, and plain tea. General instructions  You may have a physical exam.  You may have a blood or urine sample taken.  Ask your health care provider what steps will be taken to help prevent infection. These may include: ? Washing skin with a germ-killing soap. ? Taking antibiotic medicine.  Plan to have someone take you home from the hospital or clinic. You may not be able to drive for up to 10 days after your procedure.  Plan to have a responsible adult care for you for at least 24 hours after you leave the hospital or   clinic. This is important. What happens during the procedure?   An IV will be inserted into one of your veins.  You will be given one or more of the following: ? A medicine to help you relax (sedative). ? A medicine to make you fall asleep (general anesthetic). ? A  medicine that is injected into your spine to numb the area below and slightly above the injection site (spinal anesthetic).  Your legs will be placed in foot rests (stirrups) so that your legs are apart and your knees are bent.  The resectoscope will be passed through your urethra to your prostate.  Parts of your prostate will be resected using the cutting edge of the resectoscope.  The resectoscope will be removed.  A small, thin tube (catheter) will be passed through your urethra and into your bladder. The catheter will drain urine into a bag outside of your body. ? Fluid may be passed through the catheter to keep the catheter open. The procedure may vary among health care providers and hospitals. What happens after the procedure?  Your blood pressure, heart rate, breathing rate, and blood oxygen level will be monitored until you leave the hospital or clinic.  You may continue to receive fluids and medicines through an IV.  You may have some pain. Pain medicine will be available to help you.  You will have a catheter draining your urine. ? You may have blood in your urine. Your catheter may be kept in until your urine is clear. ? Your urinary drainage will be monitored. If necessary, your bladder may be rinsed out (irrigated) through your catheter.  You will be encouraged to walk around as soon as possible.  You may have to wear compression stockings. These stockings help prevent blood clots and reduce swelling in your legs.  Do not drive for 24 hours if you were given a sedative during your procedure. Summary  Transurethral resection of the prostate (TURP) is the removal (resection) of part of the gland that produces semen (prostate gland).  The goal of this procedure is to remove enough prostate tissue to allow for a normal flow of urine.  Follow instructions from your health care provider about taking medicines and about eating and drinking before the procedure. This  information is not intended to replace advice given to you by your health care provider. Make sure you discuss any questions you have with your health care provider. Document Revised: 10/02/2018 Document Reviewed: 03/13/2018 Elsevier Patient Education  2020 [employer]  

## 2019-12-10 NOTE — Progress Notes (Signed)
Cath Change/ Replacement  Patient is present today for a catheter change due to urinary retention.  53ml of water was removed from the balloon, a 16FR foley cath was removed with out difficulty.  Patient was cleaned and prepped in a sterile fashion with betadine. A 16 FR foley cath was replaced into the bladder no complications were noted Urine return was noted 142ml and urine was yellow in color. The balloon was filled with 60ml of sterile water. A leg bag was attached for drainage.  A night bag was also given to the patient and patient was given instruction on how to change from one bag to another. Patient was given proper instruction on catheter care.    Catheter placed post cysto performed by MD  Performed by: Gibson Ramp, RN  Follow up: pt will be scheduled for surgery.

## 2019-12-10 NOTE — Progress Notes (Signed)

## 2019-12-10 NOTE — Progress Notes (Signed)
12/10/2019 11:15 AM   Danny [name] [birth year] [id number]  Referring provider: Celene Squibb, MD [address] [name],  [name] [address]  Urinary retention  HPI: Danny [name] is a 65EH here for followup for urianry retention. He underwent UDS on 6/1 which showed 400cc bladder capacity 31-33cm H2O max detruso contraxction. PVR 400cc   PMH: Past Medical History:  Diagnosis Date  . Anemia   . Arthritis   . CKD (chronic kidney disease), stage IV ([place])   . Diabetes mellitus without complication ([place])   . Foot ulcer ([place])   . Hypertension   . Urinary retention     Surgical History: Past Surgical History:  Procedure Laterality Date  . ANKLE SURGERY    . CHOLECYSTECTOMY    . FOOT SURGERY      Home Medications:  Allergies as of 12/10/2019      Reactions   Dust Mite Extract Itching, Other (See Comments)   Unknown reaction-potential shortness of breath   Prednisone Nausea And Vomiting   Rocephin [ceftriaxone] Nausea And Vomiting      Medication List       Accurate as of December 10, 2019 11:15 AM. If you have any questions, ask your nurse or doctor.        Accu-Chek SmartView test strip Generic drug: glucose blood 1 each by Other route as needed.   acetaminophen 325 MG tablet Commonly known as: TYLENOL Take 650 mg by mouth as needed for mild pain or moderate pain.   allopurinol 300 MG tablet Commonly known as: ZYLOPRIM Take 1 tablet (300 mg total) by mouth daily.   amLODipine 5 MG tablet Commonly known as: NORVASC Take 1 tablet (5 mg total) by mouth at bedtime.   amLODipine 5 MG tablet Commonly known as: NORVASC Take by mouth.   amLODipine 10 MG tablet Commonly known as: NORVASC   amoxicillin-clavulanate 875-125 MG tablet Commonly known as: AUGMENTIN   amoxicillin-clavulanate 875-125 MG tablet Commonly known as: AUGMENTIN Take by mouth.   ascorbic acid 500 MG tablet Commonly known as: VITAMIN C Take 1,000 mg by mouth daily.   aspirin 325 MG  tablet Take by mouth.   Brodalumab 210 MG/1.5ML Sosy Inject 210 mg into the skin every 14 (fourteen) days.   Cholecalciferol 50 MCG (2000 UT) Caps Take by mouth.   Darbepoetin Alfa 150 MCG/0.3ML Sosy injection Commonly known as: ARANESP Inject 0.3 mLs (150 mcg total) into the skin every Saturday at 6 PM.   Droplet Insulin Syringe 31G X 5/16" 1 ML Misc Generic drug: Insulin Syringe-Needle U-100   ECZEMA MOISTURIZING EX Apply 1 application topically daily as needed (applied to legs).   feeding supplement (PRO-STAT SUGAR FREE 64) Liqd Take 30 mLs by mouth 2 (two) times daily between meals.   Fish Oil 1000 MG Caps Take by mouth.   fluticasone 50 MCG/ACT nasal spray Commonly known as: FLONASE Place 2 sprays into both nostrils daily.   folic acid 1 MG tablet Commonly known as: FOLVITE Take 1 tablet (1 mg total) by mouth daily.   glycopyrrolate 2 MG tablet Commonly known as: ROBINUL Take by mouth.   guaiFENesin 600 MG 12 hr tablet Commonly known as: MUCINEX Take 1 tablet (600 mg total) by mouth 2 (two) times daily.   hydrochlorothiazide 25 MG tablet Commonly known as: HYDRODIURIL Take by mouth.   insulin aspart protamine- aspart (70-30) 100 UNIT/ML injection Commonly known as: NOVOLOG MIX 70/30 Inject 0.08 mLs (8 Units total) into the skin 3 (  three) times daily.   lisinopril 20 MG tablet Commonly known as: ZESTRIL Take by mouth.   loratadine 10 MG tablet Commonly known as: CLARITIN Take by mouth.   Melatonin 1 MG Caps Take 1 mg by mouth as needed. For insomnia   metoprolol succinate 100 MG 24 hr tablet Commonly known as: TOPROL-XL Take 1 tablet (100 mg total) by mouth every evening.   mirabegron ER 25 MG Tb24 tablet Commonly known as: MYRBETRIQ Take 1 tablet (25 mg total) by mouth daily.   NovoLIN 70/30 ReliOn (70-30) 100 UNIT/ML injection Generic drug: insulin NPH-regular Human   nystatin powder Generic drug: nystatin   omeprazole 40 MG  capsule Commonly known as: PRILOSEC Take by mouth.   pravastatin 80 MG tablet Commonly known as: PRAVACHOL Take 1 tablet (80 mg total) by mouth every evening.   Retacrit 4000 UNIT/ML injection Generic drug: epoetin alfa-epbx 4,000 Units every 14 (fourteen) days.   Santyl ointment Generic drug: collagenase Ointment; 250 unit/gram; topical  Special Instructions: Apply to wounds per treatment orders.   silver nitrate applicators 68-12 % applicator ; topical  Special Instructions: Apply to rolled borders of wound to left plantar foot daily as needed for epithelized borders. Once A Day - PRN PRN 1   sulfamethoxazole-trimethoprim 800-160 MG tablet Commonly known as: BACTRIM DS   tamsulosin 0.4 MG Caps capsule Commonly known as: FLOMAX Take 1 capsule (0.4 mg total) by mouth daily after supper.   torsemide 20 MG tablet Commonly known as: DEMADEX Take 2 tablets (40 mg total) by mouth 2 (two) times daily.   verapamil 120 MG tablet Commonly known as: CALAN Take by mouth.   vitamin B-12 250 MCG tablet Commonly known as: CYANOCOBALAMIN Take by mouth.       Allergies:  Allergies  Allergen Reactions  . Dust Mite Extract Itching and Other (See Comments)    Unknown reaction-potential shortness of breath  . Prednisone Nausea And Vomiting  . Rocephin [Ceftriaxone] Nausea And Vomiting    Family History: No family history on file.  Social History:  reports that he has never smoked. He has never used smokeless tobacco. He reports that he does not drink alcohol and does not use drugs.  ROS: All other review of systems were reviewed and are negative except what is noted above in HPI  Physical Exam: BP (!) 157/73   Pulse 73   Temp 98.2 F (36.8 C)   Ht 6\' 1"  (1.854 m)   Wt 290 lb (131.5 kg)   BMI 38.26 kg/m   Constitutional:  Alert and oriented, No acute distress. HEENT: [place] AT, moist mucus membranes.  Trachea midline, no masses. Cardiovascular: No clubbing, cyanosis,  or edema. Respiratory: Normal respiratory effort, no increased work of breathing. GI: Abdomen is soft, nontender, nondistended, no abdominal masses GU: No CVA tenderness.  Lymph: No cervical or inguinal lymphadenopathy. Skin: No rashes, bruises or suspicious lesions. Neurologic: Grossly intact, no focal deficits, moving all 4 extremities. Psychiatric: Normal mood and affect.  Laboratory Data: Lab Results  Component Value Date   WBC 6.4 11/21/2019   HGB 9.4 (L) 12/05/2019   HCT 29.4 (L) 11/21/2019   MCV 90.7 11/21/2019   PLT 179 11/21/2019    Lab Results  Component Value Date   CREATININE 3.22 (H) 11/21/2019    No results found for: PSA  No results found for: TESTOSTERONE  Lab Results  Component Value Date   HGBA1C 7.0 (H) 07/07/2019    Urinalysis    Component Value  Date/Time   COLORURINE YELLOW 07/07/2019 1644   APPEARANCEUR CLOUDY (A) 07/07/2019 1644   LABSPEC 1.010 07/07/2019 1644   PHURINE 5.0 07/07/2019 1644   GLUCOSEU NEGATIVE 07/07/2019 1644   HGBUR SMALL (A) 07/07/2019 1644   BILIRUBINUR NEGATIVE 07/07/2019 [place] 07/07/2019 1644   PROTEINUR 100 (A) 07/07/2019 1644   NITRITE NEGATIVE 07/07/2019 1644   LEUKOCYTESUR MODERATE (A) 07/07/2019 1644    Lab Results  Component Value Date   BACTERIA MANY (A) 07/07/2019    Pertinent Imaging:  No results found for this or any previous visit.  No results found for this or any previous visit.  No results found for this or any previous visit.  No results found for this or any previous visit.  Results for orders placed during the hospital encounter of 07/07/19  US RENAL  Narrative CLINICAL DATA:  Acute kidney injury.  EXAM: RENAL / URINARY TRACT ULTRASOUND COMPLETE  COMPARISON:  Renal ultrasound 02/24/2019  FINDINGS: Right Kidney:  Renal measurements: 10.9 x 5.4 x 5.8 cm = volume: 182 mL. No hydronephrosis. Mildly increased renal cortical echogenicity. Redemonstrated exophytic  simple appearing interpolar cyst measuring 2.4 x 2.4 x 2.5 cm.  Left Kidney:  Renal measurements: 11.8 x 6.1 x 4.9 cm = volume: 184 mL. No hydronephrosis. Mildly increased renal cortical echogenicity.  Bladder:  The bladder is collapsed around a Foley catheter.  Other:  Small volume pelvic ascites.  IMPRESSION: Increased renal cortical echogenicity bilaterally, suggestive of chronic renal parenchymal disease.  2.5 cm simple appearing right renal cyst.  No hydronephrosis.  Bladder collapsed around Foley catheter.  Small volume pelvic ascites.   Electronically Signed By: Kellie Simmering DO On: 07/08/2019 09:25  No results found for this or any previous visit.  No results found for this or any previous visit.  No results found for this or any previous visit.      Cystoscopy Procedure Note  Patient identification was confirmed, informed consent was obtained, and patient was prepped using Betadine solution.  Lidocaine jelly was administered per urethral meatus.     Pre-Procedure: - Inspection reveals a normal caliber ureteral meatus.  Procedure: The flexible cystoscope was introduced without difficulty - No urethral strictures/lesions are present. - Enlarged prostate bilobar hyperplasia - Normal bladder neck - Bilateral ureteral orifices identified - Bladder mucosa  reveals no ulcers, tumors, or lesions - No bladder stones - No trabeculation  Retroflexion shows no intravesical prostatic protrustion   Post-Procedure: - Patient tolerated the procedure well   Assessment & Plan:    1. Urinary retention -We discussed indwelling foley, CIC, TURP, Urolift, rezum and after disucssing the options the patient elects for TURP. Risks/benefits/alternaitves discussed - ciprofloxacin (CIPRO) tablet 500 mg   No follow-ups on file.  Nicolette Bang, MD  [facility] [place]

## 2019-12-10 NOTE — Progress Notes (Signed)
Medical clearance request sent to Dr. Wende Neighbors via fax for upcoming TURP per Dr. Alyson Ingles request.

## 2019-12-22 ENCOUNTER — Encounter (HOSPITAL_COMMUNITY)
Admission: RE | Admit: 2019-12-22 | Discharge: 2019-12-22 | Disposition: A | Discharge status: Home / Self Care | Payer: Medicare HMO | Source: Ambulatory Visit | Attending: Nephrology | Admitting: Nephrology

## 2019-12-22 ENCOUNTER — Encounter (HOSPITAL_COMMUNITY): Payer: Self-pay

## 2019-12-22 ENCOUNTER — Other Ambulatory Visit: Payer: Self-pay

## 2019-12-22 DIAGNOSIS — N184 Chronic kidney disease, stage 4 (severe): Secondary | ICD-10-CM | POA: Diagnosis not present

## 2019-12-22 DIAGNOSIS — D631 Anemia in chronic kidney disease: Secondary | ICD-10-CM | POA: Diagnosis not present

## 2019-12-22 LAB — POCT HEMOGLOBIN-HEMACUE: Hemoglobin: 9.4 g/dL — ABNORMAL LOW (ref 13.0–17.0)

## 2019-12-22 MED ORDER — EPOETIN ALFA-EPBX 4000 UNIT/ML IJ SOLN
INTRAMUSCULAR | Status: AC
  Filled 2019-12-22: qty 1

## 2019-12-22 MED ORDER — EPOETIN ALFA-EPBX 4000 UNIT/ML IJ SOLN
4000.0000 [IU] | Freq: Once | INTRAMUSCULAR | Status: AC
  Administered 2019-12-22: 4000 [IU] via SUBCUTANEOUS

## 2019-12-23 DIAGNOSIS — L97319 Non-pressure chronic ulcer of right ankle with unspecified severity: Secondary | ICD-10-CM | POA: Diagnosis not present

## 2019-12-23 DIAGNOSIS — E11622 Type 2 diabetes mellitus with other skin ulcer: Secondary | ICD-10-CM | POA: Diagnosis not present

## 2019-12-23 DIAGNOSIS — M19071 Primary osteoarthritis, right ankle and foot: Secondary | ICD-10-CM | POA: Diagnosis not present

## 2019-12-23 DIAGNOSIS — I83013 Varicose veins of right lower extremity with ulcer of ankle: Secondary | ICD-10-CM | POA: Diagnosis not present

## 2019-12-23 DIAGNOSIS — E11621 Type 2 diabetes mellitus with foot ulcer: Secondary | ICD-10-CM | POA: Diagnosis not present

## 2019-12-23 DIAGNOSIS — R600 Localized edema: Secondary | ICD-10-CM | POA: Diagnosis not present

## 2019-12-24 ENCOUNTER — Ambulatory Visit: Payer: Medicare HMO | Admitting: Urology

## 2019-12-30 DIAGNOSIS — N184 Chronic kidney disease, stage 4 (severe): Secondary | ICD-10-CM | POA: Diagnosis not present

## 2019-12-30 DIAGNOSIS — L97519 Non-pressure chronic ulcer of other part of right foot with unspecified severity: Secondary | ICD-10-CM | POA: Diagnosis not present

## 2019-12-30 DIAGNOSIS — E1122 Type 2 diabetes mellitus with diabetic chronic kidney disease: Secondary | ICD-10-CM | POA: Diagnosis not present

## 2019-12-30 DIAGNOSIS — Z87891 Personal history of nicotine dependence: Secondary | ICD-10-CM | POA: Diagnosis not present

## 2019-12-30 DIAGNOSIS — L97309 Non-pressure chronic ulcer of unspecified ankle with unspecified severity: Secondary | ICD-10-CM | POA: Diagnosis not present

## 2019-12-30 DIAGNOSIS — Z794 Long term (current) use of insulin: Secondary | ICD-10-CM | POA: Diagnosis not present

## 2019-12-30 DIAGNOSIS — I509 Heart failure, unspecified: Secondary | ICD-10-CM | POA: Diagnosis not present

## 2019-12-30 DIAGNOSIS — E11622 Type 2 diabetes mellitus with other skin ulcer: Secondary | ICD-10-CM | POA: Diagnosis not present

## 2019-12-30 DIAGNOSIS — E1140 Type 2 diabetes mellitus with diabetic neuropathy, unspecified: Secondary | ICD-10-CM | POA: Diagnosis not present

## 2019-12-30 DIAGNOSIS — I130 Hypertensive heart and chronic kidney disease with heart failure and stage 1 through stage 4 chronic kidney disease, or unspecified chronic kidney disease: Secondary | ICD-10-CM | POA: Diagnosis not present

## 2019-12-31 DIAGNOSIS — R070 Pain in throat: Secondary | ICD-10-CM | POA: Diagnosis not present

## 2019-12-31 DIAGNOSIS — Z712 Person consulting for explanation of examination or test findings: Secondary | ICD-10-CM | POA: Diagnosis not present

## 2019-12-31 DIAGNOSIS — L89513 Pressure ulcer of right ankle, stage 3: Secondary | ICD-10-CM | POA: Diagnosis not present

## 2019-12-31 DIAGNOSIS — Z0000 Encounter for general adult medical examination without abnormal findings: Secondary | ICD-10-CM | POA: Diagnosis not present

## 2019-12-31 DIAGNOSIS — J329 Chronic sinusitis, unspecified: Secondary | ICD-10-CM | POA: Diagnosis not present

## 2019-12-31 DIAGNOSIS — L89512 Pressure ulcer of right ankle, stage 2: Secondary | ICD-10-CM | POA: Diagnosis not present

## 2019-12-31 DIAGNOSIS — E11649 Type 2 diabetes mellitus with hypoglycemia without coma: Secondary | ICD-10-CM | POA: Diagnosis not present

## 2019-12-31 DIAGNOSIS — L97909 Non-pressure chronic ulcer of unspecified part of unspecified lower leg with unspecified severity: Secondary | ICD-10-CM | POA: Diagnosis not present

## 2019-12-31 DIAGNOSIS — E559 Vitamin D deficiency, unspecified: Secondary | ICD-10-CM | POA: Diagnosis not present

## 2020-01-05 ENCOUNTER — Encounter (HOSPITAL_COMMUNITY): Payer: Medicare HMO

## 2020-01-05 ENCOUNTER — Encounter (HOSPITAL_COMMUNITY): Admission: RE | Admit: 2020-01-05 | Payer: Medicare HMO | Source: Ambulatory Visit

## 2020-01-06 DIAGNOSIS — Z87891 Personal history of nicotine dependence: Secondary | ICD-10-CM | POA: Diagnosis not present

## 2020-01-06 DIAGNOSIS — E1122 Type 2 diabetes mellitus with diabetic chronic kidney disease: Secondary | ICD-10-CM | POA: Diagnosis not present

## 2020-01-06 DIAGNOSIS — I130 Hypertensive heart and chronic kidney disease with heart failure and stage 1 through stage 4 chronic kidney disease, or unspecified chronic kidney disease: Secondary | ICD-10-CM | POA: Diagnosis not present

## 2020-01-06 DIAGNOSIS — E1140 Type 2 diabetes mellitus with diabetic neuropathy, unspecified: Secondary | ICD-10-CM | POA: Diagnosis not present

## 2020-01-06 DIAGNOSIS — L97311 Non-pressure chronic ulcer of right ankle limited to breakdown of skin: Secondary | ICD-10-CM | POA: Diagnosis not present

## 2020-01-06 DIAGNOSIS — N184 Chronic kidney disease, stage 4 (severe): Secondary | ICD-10-CM | POA: Diagnosis not present

## 2020-01-06 DIAGNOSIS — Z794 Long term (current) use of insulin: Secondary | ICD-10-CM | POA: Diagnosis not present

## 2020-01-06 DIAGNOSIS — L97519 Non-pressure chronic ulcer of other part of right foot with unspecified severity: Secondary | ICD-10-CM | POA: Diagnosis not present

## 2020-01-06 DIAGNOSIS — I509 Heart failure, unspecified: Secondary | ICD-10-CM | POA: Diagnosis not present

## 2020-01-06 DIAGNOSIS — E11622 Type 2 diabetes mellitus with other skin ulcer: Secondary | ICD-10-CM | POA: Diagnosis not present

## 2020-01-07 DIAGNOSIS — N184 Chronic kidney disease, stage 4 (severe): Secondary | ICD-10-CM | POA: Diagnosis not present

## 2020-01-07 DIAGNOSIS — E782 Mixed hyperlipidemia: Secondary | ICD-10-CM | POA: Diagnosis not present

## 2020-01-07 DIAGNOSIS — I739 Peripheral vascular disease, unspecified: Secondary | ICD-10-CM | POA: Diagnosis not present

## 2020-01-07 DIAGNOSIS — E1122 Type 2 diabetes mellitus with diabetic chronic kidney disease: Secondary | ICD-10-CM | POA: Diagnosis not present

## 2020-01-07 DIAGNOSIS — L409 Psoriasis, unspecified: Secondary | ICD-10-CM | POA: Diagnosis not present

## 2020-01-07 DIAGNOSIS — I129 Hypertensive chronic kidney disease with stage 1 through stage 4 chronic kidney disease, or unspecified chronic kidney disease: Secondary | ICD-10-CM | POA: Diagnosis not present

## 2020-01-07 DIAGNOSIS — M109 Gout, unspecified: Secondary | ICD-10-CM | POA: Diagnosis not present

## 2020-01-07 DIAGNOSIS — E1165 Type 2 diabetes mellitus with hyperglycemia: Secondary | ICD-10-CM | POA: Diagnosis not present

## 2020-01-07 DIAGNOSIS — D631 Anemia in chronic kidney disease: Secondary | ICD-10-CM | POA: Diagnosis not present

## 2020-01-12 NOTE — Patient Instructions (Addendum)
Itzael Liptak  01/12/2020     @PREFPERIOPPHARMACY @   Your procedure is scheduled on 01/15/2020.  Report to [name] at 11:00 A.M.  Call this number if you have problems the morning of surgery:  [phone]   Remember:  Do not eat or drink after midnight.     Take these medicines the morning of surgery with A SIP OF WATER : Norvasc, Flonase, Metoprolol, Myrbetriq and Demadex.  Please 1/2 your insulin dosage the night before surgery.  No diabetic medication the am of surgery.    Do not wear jewelry, make-up or nail polish.  Do not wear lotions, powders, or perfumes, or deodorant.  Do not shave 48 hours prior to surgery.  Men may shave face and neck.  Do not bring valuables to the hospital.  [facility] is not responsible for any belongings or valuables.  Contacts, dentures or bridgework may not be worn into surgery.  Leave your suitcase in the car.  After surgery it may be brought to your room.  For patients admitted to the hospital, discharge time will be determined by your treatment team.  Patients discharged the day of surgery will not be allowed to drive home.   Name and phone number of your driver:   family Special instructions:  *n/a**  Please read over the following fact sheets that you were given. Care and Recovery After Surgery   Please use the Hibiclens Wash the night before surgery and the morning of surgery.  See instructions  How to Use Chlorhexidine for Bathing Chlorhexidine gluconate (CHG) is a germ-killing (antiseptic) solution that is used to clean the skin. It can get rid of the bacteria that normally live on the skin and can keep them away for about 24 hours. To clean your skin with CHG, you may be given:  A CHG solution to use in the shower or as part of a sponge bath.  A prepackaged cloth that contains CHG. Cleaning your skin with CHG may help lower the risk for infection:  While you are staying in the intensive care unit of the hospital.  If you  have a vascular access, such as a central line, to provide short-term or long-term access to your veins.  If you have a catheter to drain urine from your bladder.  If you are on a ventilator. A ventilator is a machine that helps you breathe by moving air in and out of your lungs.  After surgery. What are the risks? Risks of using CHG include:  A skin reaction.  Hearing loss, if CHG gets in your ears.  Eye injury, if CHG gets in your eyes and is not rinsed out.  The CHG product catching fire. Make sure that you avoid smoking and flames after applying CHG to your skin. Do not use CHG:  If you have a chlorhexidine allergy or have previously reacted to chlorhexidine.  On babies younger than 63 months of age. How to use CHG solution  Use CHG only as told by your health care provider, and follow the instructions on the label.  Use the full amount of CHG as directed. Usually, this is one bottle. During a shower Follow these steps when using CHG solution during a shower (unless your health care provider gives you different instructions): 1. Start the shower. 2. Use your normal soap and shampoo to wash your face and hair. 3. Turn off the shower or move out of the shower stream. 4. Pour the CHG onto a clean  washcloth. Do not use any type of brush or rough-edged sponge. 5. Starting at your neck, lather your body down to your toes. Make sure you follow these instructions: ? If you will be having surgery, pay special attention to the part of your body where you will be having surgery. Scrub this area for at least 1 minute. ? Do not use CHG on your head or face. If the solution gets into your ears or eyes, rinse them well with water. ? Avoid your genital area. ? Avoid any areas of skin that have broken skin, cuts, or scrapes. ? Scrub your back and under your arms. Make sure to wash skin folds. 6. Let the lather sit on your skin for 1-2 minutes or as long as told by your health care  provider. 7. Thoroughly rinse your entire body in the shower. Make sure that all body creases and crevices are rinsed well. 8. Dry off with a clean towel. Do not put any substances on your body afterward--such as powder, lotion, or perfume--unless you are told to do so by your health care provider. Only use lotions that are recommended by the manufacturer. 9. Put on clean clothes or pajamas. 10. If it is the night before your surgery, sleep in clean sheets.  During a sponge bath Follow these steps when using CHG solution during a sponge bath (unless your health care provider gives you different instructions): 1. Use your normal soap and shampoo to wash your face and hair. 2. Pour the CHG onto a clean washcloth. 3. Starting at your neck, lather your body down to your toes. Make sure you follow these instructions: ? If you will be having surgery, pay special attention to the part of your body where you will be having surgery. Scrub this area for at least 1 minute. ? Do not use CHG on your head or face. If the solution gets into your ears or eyes, rinse them well with water. ? Avoid your genital area. ? Avoid any areas of skin that have broken skin, cuts, or scrapes. ? Scrub your back and under your arms. Make sure to wash skin folds. 4. Let the lather sit on your skin for 1-2 minutes or as long as told by your health care provider. 5. Using a different clean, wet washcloth, thoroughly rinse your entire body. Make sure that all body creases and crevices are rinsed well. 6. Dry off with a clean towel. Do not put any substances on your body afterward--such as powder, lotion, or perfume--unless you are told to do so by your health care provider. Only use lotions that are recommended by the manufacturer. 7. Put on clean clothes or pajamas. 8. If it is the night before your surgery, sleep in clean sheets. How to use CHG prepackaged cloths  Only use CHG cloths as told by your health care provider, and  follow the instructions on the label.  Use the CHG cloth on clean, dry skin.  Do not use the CHG cloth on your head or face unless your health care provider tells you to.  When washing with the CHG cloth: ? Avoid your genital area. ? Avoid any areas of skin that have broken skin, cuts, or scrapes. Before surgery Follow these steps when using a CHG cloth to clean before surgery (unless your health care provider gives you different instructions): 1. Using the CHG cloth, vigorously scrub the part of your body where you will be having surgery. Scrub using a back-and-forth motion for  3 minutes. The area on your body should be completely wet with CHG when you are done scrubbing. 2. Do not rinse. Discard the cloth and let the area air-dry. Do not put any substances on the area afterward, such as powder, lotion, or perfume. 3. Put on clean clothes or pajamas. 4. If it is the night before your surgery, sleep in clean sheets.  For general bathing Follow these steps when using CHG cloths for general bathing (unless your health care provider gives you different instructions). 1. Use a separate CHG cloth for each area of your body. Make sure you wash between any folds of skin and between your fingers and toes. Wash your body in the following order, switching to a new cloth after each step: ? The front of your neck, shoulders, and chest. ? Both of your arms, under your arms, and your hands. ? Your stomach and groin area, avoiding the genitals. ? Your right leg and foot. ? Your left leg and foot. ? The back of your neck, your back, and your buttocks. 2. Do not rinse. Discard the cloth and let the area air-dry. Do not put any substances on your body afterward--such as powder, lotion, or perfume--unless you are told to do so by your health care provider. Only use lotions that are recommended by the manufacturer. 3. Put on clean clothes or pajamas. Contact a health care provider if:  Your skin gets  irritated after scrubbing.  You have questions about using your solution or cloth. Get help right away if:  Your eyes become very red or swollen.  Your eyes itch badly.  Your skin itches badly and is red or swollen.  Your hearing changes.  You have trouble seeing.  You have swelling or tingling in your mouth or throat.  You have trouble breathing.  You swallow any chlorhexidine. Summary  Chlorhexidine gluconate (CHG) is a germ-killing (antiseptic) solution that is used to clean the skin. Cleaning your skin with CHG may help to lower your risk for infection.  You may be given CHG to use for bathing. It may be in a bottle or in a prepackaged cloth to use on your skin. Carefully follow your health care provider's instructions and the instructions on the product label.  Do not use CHG if you have a chlorhexidine allergy.  Contact your health care provider if your skin gets irritated after scrubbing. This information is not intended to replace advice given to you by your health care provider. Make sure you discuss any questions you have with your health care provider. Document Revised: 08/29/2018 Document Reviewed: 05/10/2017 Elsevier Patient Education  [place] Anesthesia, Adult General anesthesia is the use of medicines to make a person "go to sleep" (unconscious) for a medical procedure. General anesthesia must be used for certain procedures, and is often recommended for procedures that:  Last a long time.  Require you to be still or in an unusual position.  Are major and can cause blood loss. The medicines used for general anesthesia are called general anesthetics. As well as making you unconscious for a certain amount of time, these medicines:  Prevent pain.  Control your blood pressure.  Relax your muscles. Tell a health care provider about:  Any allergies you have.  All medicines you are taking, including vitamins, herbs, eye drops, creams,  and over-the-counter medicines.  Any problems you or family members have had with anesthetic medicines.  Types of anesthetics you have had in the  past.  Any blood disorders you have.  Any surgeries you have had.  Any medical conditions you have.  Any recent upper respiratory, chest, or ear infections.  Any history of: ? Heart or lung conditions, such as heart failure, sleep apnea, asthma, or chronic obstructive pulmonary disease (COPD). ? Armed forces logistics/support/administrative officer. ? Depression or anxiety.  Any tobacco or drug use, including marijuana or alcohol use.  Whether you are pregnant or may be pregnant. What are the risks? Generally, this is a safe procedure. However, problems may occur, including:  Allergic reaction.  Lung and heart problems.  Inhaling food or liquid from the stomach into the lungs (aspiration).  Nerve injury.  Dental injury.  Air in the bloodstream, which can lead to stroke.  Extreme agitation or confusion (delirium) when you wake up from the anesthetic.  Waking up during your procedure and being unable to move. This is rare. These problems are more likely to develop if you are having a major surgery or if you have an advanced or serious medical condition. You can prevent some of these complications by answering all of your health care provider's questions thoroughly and by following all instructions before your procedure. General anesthesia can cause side effects, including:  Nausea or vomiting.  A sore throat from the breathing tube.  Hoarseness.  Wheezing or coughing.  Shaking chills.  Tiredness.  Body aches.  Anxiety.  Sleepiness or drowsiness.  Confusion or agitation. What happens before the procedure? Staying hydrated Follow instructions from your health care provider about hydration, which may include:  Up to 2 hours before the procedure - you may continue to drink clear liquids, such as water, clear fruit juice, black coffee, and plain  tea.  Eating and drinking restrictions Follow instructions from your health care provider about eating and drinking, which may include:  8 hours before the procedure - stop eating heavy meals or foods such as meat, fried foods, or fatty foods.  6 hours before the procedure - stop eating light meals or foods, such as toast or cereal.  6 hours before the procedure - stop drinking milk or drinks that contain milk.  2 hours before the procedure - stop drinking clear liquids. Medicines Ask your health care provider about:  Changing or stopping your regular medicines. This is especially important if you are taking diabetes medicines or blood thinners.  Taking medicines such as aspirin and ibuprofen. These medicines can thin your blood. Do not take these medicines unless your health care provider tells you to take them.  Taking over-the-counter medicines, vitamins, herbs, and supplements. Do not take these during the week before your procedure unless your health care provider approves them. General instructions  Starting 3-6 weeks before the procedure, do not use any products that contain nicotine or tobacco, such as cigarettes and e-cigarettes. If you need help quitting, ask your health care provider.  If you brush your teeth on the morning of the procedure, make sure to spit out all of the toothpaste.  Tell your health care provider if you become ill or develop a cold, cough, or fever.  If instructed by your health care provider, bring your sleep apnea device with you on the day of your surgery (if applicable).  Ask your health care provider if you will be going home the same day, the following day, or after a longer hospital stay. ? Plan to have someone take you home from the hospital or clinic. ? Plan to have a responsible adult care for  you for at least 24 hours after you leave the hospital or clinic. This is important. What happens during the procedure?   You will be given  anesthetics through both of the following: ? A mask placed over your nose and mouth. ? An IV in one of your veins.  You may receive a medicine to help you relax (sedative).  After you are unconscious, a breathing tube may be inserted down your throat to help you breathe. This will be removed before you wake up.  An anesthesia specialist will stay with you throughout your procedure. He or she will: ? Keep you comfortable and safe by continuing to give you medicines and adjusting the amount of medicine that you get. ? Monitor your blood pressure, pulse, and oxygen levels to make sure that the anesthetics do not cause any problems. The procedure may vary among health care providers and hospitals. What happens after the procedure?  Your blood pressure, temperature, heart rate, breathing rate, and blood oxygen level will be monitored until the medicines you were given have worn off.  You will wake up in a recovery area. You may wake up slowly.  If you feel anxious or agitated, you may be given medicine to help you calm down.  If you will be going home the same day, your health care provider may check to make sure you can walk, drink, and urinate.  Your health care provider will treat any pain or side effects you have before you go home.  Do not drive for 24 hours if you were given a sedative. Summary  General anesthesia is used to keep you still and prevent pain during a procedure.  It is important to tell your health care provider about your medical history and any surgeries you have had, and previous experience with anesthesia.  Follow your health care provider's instructions about when to stop eating, drinking, or taking certain medicines before your procedure.  Plan to have someone take you home from the hospital or clinic. This information is not intended to replace advice given to you by your health care provider. Make sure you discuss any questions you have with your health care  provider. Document Revised: 10/30/2017 Document Reviewed: 01/26/2017 Elsevier Patient Education  [place].  Transurethral Resection of the Prostate Transurethral resection of the prostate (TURP) is the removal (resection) of part of the gland that produces semen (prostate gland). This procedure is done to treat benign prostatic hyperplasia (BPH). BPH is an abnormal, noncancerous (benign) increase in the number of cells that make up the prostate tissue. BPH causes the prostate to get bigger. The enlarged prostate can push against or block the tube that drains urine from the bladder out of the body (urethra). BPH can affect normal urine flow by causing bladder infections, difficulty controlling bladder function, and difficulty emptying the bladder.  The goal of TURP is to remove enough prostate tissue to allow for a normal flow of urine. The procedure will allow you to empty your bladder more completely when you urinate so that you can urinate less often. In a transurethral resection, a thin telescope with a light, a tiny camera, and an electric cutting edge (resectoscope) is passed through the urethra and into the prostate. The opening of the urethra is at the end of the penis. Tell a health care provider about:  Any allergies you have.  All medicines you are taking, including vitamins, herbs, eye drops, creams, and over-the-counter medicines.  Any problems you or  family members have had with anesthetic medicines.  Any blood disorders you have.  Any surgeries you have had.  Any medical conditions you have.  Any prostate infections you have had. What are the risks? Generally, this is a safe procedure. However, problems may occur, including:  Infection.  Bleeding.  Allergic reactions to medicines.  Damage to other structures or organs, such as: ? The urethra. ? The bladder. ? Muscles that surround the prostate.  Difficulty getting an erection.  Inability to control when  you urinate (incontinence).  Scarring, which may cause problems with urine flow. What happens before the procedure? Medicines Ask your health care provider about:  Changing or stopping your regular medicines. This is especially important if you are taking diabetes medicines or blood thinners.  Taking medicines such as aspirin and ibuprofen. These medicines can thin your blood. Do not take these medicines unless your health care provider tells you to take them.  Taking over-the-counter medicines, vitamins, herbs, and supplements. Eating and drinking Follow instructions from your health care provider about eating and drinking, which may include:  8 hours before the procedure - stop eating heavy meals or foods, such as meat, fried foods, or fatty foods.  6 hours before the procedure - stop eating light meals or foods, such as toast or cereal.  6 hours before the procedure - stop drinking milk or drinks that contain milk.  2 hours before the procedure - stop drinking clear liquids. Staying hydrated Follow instructions from your health care provider about hydration, which may include:  Up to 2 hours before the procedure - you may continue to drink clear liquids, such as water, clear fruit juice, black coffee, and plain tea. General instructions  You may have a physical exam.  You may have a blood or urine sample taken.  Ask your health care provider what steps will be taken to help prevent infection. These may include: ? Washing skin with a germ-killing soap. ? Taking antibiotic medicine.  Plan to have someone take you home from the hospital or clinic. You may not be able to drive for up to 10 days after your procedure.  Plan to have a responsible adult care for you for at least 24 hours after you leave the hospital or clinic. This is important. What happens during the procedure?   An IV will be inserted into one of your veins.  You will be given one or more of the  following: ? A medicine to help you relax (sedative). ? A medicine to make you fall asleep (general anesthetic). ? A medicine that is injected into your spine to numb the area below and slightly above the injection site (spinal anesthetic).  Your legs will be placed in foot rests (stirrups) so that your legs are apart and your knees are bent.  The resectoscope will be passed through your urethra to your prostate.  Parts of your prostate will be resected using the cutting edge of the resectoscope.  The resectoscope will be removed.  A small, thin tube (catheter) will be passed through your urethra and into your bladder. The catheter will drain urine into a bag outside of your body. ? Fluid may be passed through the catheter to keep the catheter open. The procedure may vary among health care providers and hospitals. What happens after the procedure?  Your blood pressure, heart rate, breathing rate, and blood oxygen level will be monitored until you leave the hospital or clinic.  You may continue to receive  fluids and medicines through an IV.  You may have some pain. Pain medicine will be available to help you.  You will have a catheter draining your urine. ? You may have blood in your urine. Your catheter may be kept in until your urine is clear. ? Your urinary drainage will be monitored. If necessary, your bladder may be rinsed out (irrigated) through your catheter.  You will be encouraged to walk around as soon as possible.  You may have to wear compression stockings. These stockings help prevent blood clots and reduce swelling in your legs.  Do not drive for 24 hours if you were given a sedative during your procedure. Summary  Transurethral resection of the prostate (TURP) is the removal (resection) of part of the gland that produces semen (prostate gland).  The goal of this procedure is to remove enough prostate tissue to allow for a normal flow of urine.  Follow instructions  from your health care provider about taking medicines and about eating and drinking before the procedure. This information is not intended to replace advice given to you by your health care provider. Make sure you discuss any questions you have with your health care provider. Document Revised: 10/02/2018 Document Reviewed: 03/13/2018 Elsevier Patient Education  [place].

## 2020-01-13 ENCOUNTER — Other Ambulatory Visit (HOSPITAL_COMMUNITY)
Admission: RE | Admit: 2020-01-13 | Discharge: 2020-01-13 | Disposition: A | Discharge status: Home / Self Care | Payer: Medicare HMO | Source: Ambulatory Visit | Attending: Urology | Admitting: Urology

## 2020-01-13 ENCOUNTER — Encounter (HOSPITAL_COMMUNITY): Payer: Self-pay

## 2020-01-13 ENCOUNTER — Other Ambulatory Visit: Payer: Self-pay

## 2020-01-13 ENCOUNTER — Encounter (HOSPITAL_COMMUNITY)
Admission: RE | Admit: 2020-01-13 | Discharge: 2020-01-13 | Disposition: A | Discharge status: Home / Self Care | Payer: Medicare HMO | Source: Ambulatory Visit | Attending: Nephrology | Admitting: Nephrology

## 2020-01-13 ENCOUNTER — Encounter (HOSPITAL_COMMUNITY)
Admission: RE | Admit: 2020-01-13 | Discharge: 2020-01-13 | Disposition: A | Discharge status: Home / Self Care | Payer: Medicare HMO | Source: Ambulatory Visit | Attending: Urology | Admitting: Urology

## 2020-01-13 DIAGNOSIS — N184 Chronic kidney disease, stage 4 (severe): Secondary | ICD-10-CM | POA: Diagnosis not present

## 2020-01-13 DIAGNOSIS — Z20822 Contact with and (suspected) exposure to covid-19: Secondary | ICD-10-CM | POA: Diagnosis not present

## 2020-01-13 DIAGNOSIS — D631 Anemia in chronic kidney disease: Secondary | ICD-10-CM | POA: Insufficient documentation

## 2020-01-13 DIAGNOSIS — Z01812 Encounter for preprocedural laboratory examination: Secondary | ICD-10-CM | POA: Diagnosis not present

## 2020-01-13 LAB — BASIC METABOLIC PANEL
Anion gap: 12 (ref 5–15)
BUN: 49 mg/dL — ABNORMAL HIGH (ref 8–23)
CO2: 22 mmol/L (ref 22–32)
Calcium: 8.7 mg/dL — ABNORMAL LOW (ref 8.9–10.3)
Chloride: 104 mmol/L (ref 98–111)
Creatinine, Ser: 3.63 mg/dL — ABNORMAL HIGH (ref 0.61–1.24)
GFR calc Af Amer: 19 mL/min — ABNORMAL LOW (ref >60)
GFR calc non Af Amer: 16 mL/min — ABNORMAL LOW (ref >60)
Glucose, Bld: 157 mg/dL — ABNORMAL HIGH (ref 70–99)
Potassium: 3.8 mmol/L (ref 3.5–5.1)
Sodium: 138 mmol/L (ref 135–145)

## 2020-01-13 LAB — CBC WITH DIFFERENTIAL/PLATELET
Abs Immature Granulocytes: 0.02 10*3/uL (ref 0.00–0.07)
Basophils Absolute: 0 10*3/uL (ref 0.0–0.1)
Basophils Relative: 0 %
Eosinophils Absolute: 0.3 10*3/uL (ref 0.0–0.5)
Eosinophils Relative: 4 %
HCT: 30 % — ABNORMAL LOW (ref 39.0–52.0)
Hemoglobin: 9.7 g/dL — ABNORMAL LOW (ref 13.0–17.0)
Immature Granulocytes: 0 %
Lymphocytes Relative: 10 %
Lymphs Abs: 0.8 10*3/uL (ref 0.7–4.0)
MCH: 28.4 pg (ref 26.0–34.0)
MCHC: 32.3 g/dL (ref 30.0–36.0)
MCV: 87.7 fL (ref 80.0–100.0)
Monocytes Absolute: 0.5 10*3/uL (ref 0.1–1.0)
Monocytes Relative: 6 %
Neutro Abs: 6.6 10*3/uL (ref 1.7–7.7)
Neutrophils Relative %: 80 %
Platelets: 152 10*3/uL (ref 150–400)
RBC: 3.42 MIL/uL — ABNORMAL LOW (ref 4.22–5.81)
RDW: 15.9 % — ABNORMAL HIGH (ref 11.5–15.5)
WBC: 8.2 10*3/uL (ref 4.0–10.5)
nRBC: 0 % (ref 0.0–0.2)

## 2020-01-13 LAB — HEMOGLOBIN A1C
Hgb A1c MFr Bld: 7.2 % — ABNORMAL HIGH (ref 4.8–5.6)
Mean Plasma Glucose: 159.94 mg/dL

## 2020-01-13 LAB — SARS CORONAVIRUS 2 (TAT 6-24 HRS): SARS Coronavirus 2: NEGATIVE

## 2020-01-13 MED ORDER — EPOETIN ALFA-EPBX 4000 UNIT/ML IJ SOLN
INTRAMUSCULAR | Status: AC
  Filled 2020-01-13: qty 1

## 2020-01-13 MED ORDER — EPOETIN ALFA-EPBX 4000 UNIT/ML IJ SOLN
4000.0000 [IU] | Freq: Once | INTRAMUSCULAR | Status: AC
  Administered 2020-01-13: 4000 [IU] via SUBCUTANEOUS

## 2020-01-14 LAB — POCT HEMOGLOBIN-HEMACUE: Hemoglobin: 9.8 g/dL — ABNORMAL LOW (ref 13.0–17.0)

## 2020-01-14 NOTE — Pre-Procedure Instructions (Signed)
[id number] routed to PCP. 

## 2020-01-15 ENCOUNTER — Encounter (HOSPITAL_COMMUNITY): Payer: Self-pay | Admitting: Urology

## 2020-01-15 ENCOUNTER — Ambulatory Visit (HOSPITAL_COMMUNITY): Payer: Medicare HMO | Admitting: Anesthesiology

## 2020-01-15 ENCOUNTER — Encounter (HOSPITAL_COMMUNITY)
Admission: RE | Disposition: A | Discharge status: Home / Self Care | Payer: Self-pay | Source: Home / Self Care | Attending: Urology

## 2020-01-15 ENCOUNTER — Other Ambulatory Visit: Payer: Self-pay

## 2020-01-15 ENCOUNTER — Observation Stay (HOSPITAL_COMMUNITY)
Admission: RE | Admit: 2020-01-15 | Discharge: 2020-01-16 | Disposition: A | Discharge status: Home / Self Care | Payer: Medicare HMO | Attending: Urology | Admitting: Urology

## 2020-01-15 DIAGNOSIS — E1122 Type 2 diabetes mellitus with diabetic chronic kidney disease: Secondary | ICD-10-CM | POA: Diagnosis not present

## 2020-01-15 DIAGNOSIS — D649 Anemia, unspecified: Secondary | ICD-10-CM | POA: Diagnosis present

## 2020-01-15 DIAGNOSIS — N320 Bladder-neck obstruction: Secondary | ICD-10-CM

## 2020-01-15 DIAGNOSIS — R339 Retention of urine, unspecified: Secondary | ICD-10-CM | POA: Diagnosis not present

## 2020-01-15 DIAGNOSIS — N400 Enlarged prostate without lower urinary tract symptoms: Secondary | ICD-10-CM | POA: Diagnosis not present

## 2020-01-15 DIAGNOSIS — N189 Chronic kidney disease, unspecified: Secondary | ICD-10-CM | POA: Diagnosis not present

## 2020-01-15 DIAGNOSIS — I129 Hypertensive chronic kidney disease with stage 1 through stage 4 chronic kidney disease, or unspecified chronic kidney disease: Secondary | ICD-10-CM | POA: Diagnosis not present

## 2020-01-15 DIAGNOSIS — N184 Chronic kidney disease, stage 4 (severe): Secondary | ICD-10-CM | POA: Insufficient documentation

## 2020-01-15 DIAGNOSIS — I509 Heart failure, unspecified: Secondary | ICD-10-CM | POA: Diagnosis not present

## 2020-01-15 DIAGNOSIS — N401 Enlarged prostate with lower urinary tract symptoms: Secondary | ICD-10-CM

## 2020-01-15 DIAGNOSIS — I130 Hypertensive heart and chronic kidney disease with heart failure and stage 1 through stage 4 chronic kidney disease, or unspecified chronic kidney disease: Secondary | ICD-10-CM | POA: Diagnosis not present

## 2020-01-15 DIAGNOSIS — R338 Other retention of urine: Secondary | ICD-10-CM

## 2020-01-15 DIAGNOSIS — N138 Other obstructive and reflux uropathy: Secondary | ICD-10-CM | POA: Diagnosis present

## 2020-01-15 HISTORY — PX: TRANSURETHRAL RESECTION OF PROSTATE: SHX73

## 2020-01-15 LAB — TYPE AND SCREEN
ABO/RH(D): O POS
Antibody Screen: NEGATIVE

## 2020-01-15 LAB — GLUCOSE, CAPILLARY
Glucose-Capillary: 89 mg/dL (ref 70–99)
Glucose-Capillary: 155 mg/dL — ABNORMAL HIGH (ref 70–99)
Glucose-Capillary: 130 mg/dL — ABNORMAL HIGH (ref 70–99)

## 2020-01-15 LAB — ABO/RH: ABO/RH(D): O POS

## 2020-01-15 SURGERY — TURP (TRANSURETHRAL RESECTION OF PROSTATE)
Anesthesia: General

## 2020-01-15 MED ORDER — OXYCODONE-ACETAMINOPHEN 5-325 MG PO TABS: 1.0000 | ORAL_TABLET | ORAL | Status: DC | PRN

## 2020-01-15 MED ORDER — MIDAZOLAM HCL 2 MG/2ML IJ SOLN
INTRAMUSCULAR | Status: AC
  Filled 2020-01-15: qty 2

## 2020-01-15 MED ORDER — ONDANSETRON HCL 4 MG/2ML IJ SOLN
INTRAMUSCULAR | Status: DC | PRN
  Administered 2020-01-15: 4 mg via INTRAVENOUS

## 2020-01-15 MED ORDER — MIDAZOLAM HCL 5 MG/5ML IJ SOLN
INTRAMUSCULAR | Status: DC | PRN
  Administered 2020-01-15: 1 mg via INTRAVENOUS

## 2020-01-15 MED ORDER — LIDOCAINE 2% (20 MG/ML) 5 ML SYRINGE
INTRAMUSCULAR | Status: AC
  Filled 2020-01-15: qty 5

## 2020-01-15 MED ORDER — SODIUM CHLORIDE 0.9 % IR SOLN
Status: DC | PRN
  Administered 2020-01-15: 3000 mL

## 2020-01-15 MED ORDER — DIPHENHYDRAMINE HCL 12.5 MG/5ML PO ELIX: 12.5000 mg | ORAL_SOLUTION | Freq: Four times a day (QID) | ORAL | Status: DC | PRN

## 2020-01-15 MED ORDER — ZOLPIDEM TARTRATE 5 MG PO TABS: 5.0000 mg | ORAL_TABLET | Freq: Every evening | ORAL | Status: DC | PRN

## 2020-01-15 MED ORDER — PROMETHAZINE HCL 25 MG/ML IJ SOLN: 6.2500 mg | INTRAMUSCULAR | Status: DC | PRN

## 2020-01-15 MED ORDER — LIDOCAINE 2% (20 MG/ML) 5 ML SYRINGE
INTRAMUSCULAR | Status: DC | PRN
  Administered 2020-01-15: 80 mg via INTRAVENOUS

## 2020-01-15 MED ORDER — LACTATED RINGERS IV SOLN
Freq: Once | INTRAVENOUS | Status: AC
  Administered 2020-01-15: 1000 mL via INTRAVENOUS

## 2020-01-15 MED ORDER — SUGAMMADEX SODIUM 500 MG/5ML IV SOLN
INTRAVENOUS | Status: DC | PRN
Start: 2020-01-15 — End: 2020-01-15
  Administered 2020-01-15: 500 mg via INTRAVENOUS

## 2020-01-15 MED ORDER — METOPROLOL SUCCINATE ER 50 MG PO TB24
100.0000 mg | ORAL_TABLET | Freq: Every evening | ORAL | Status: DC
  Filled 2020-01-15: qty 2

## 2020-01-15 MED ORDER — PROPOFOL 10 MG/ML IV BOLUS
INTRAVENOUS | Status: AC
  Filled 2020-01-15: qty 20

## 2020-01-15 MED ORDER — ROCURONIUM BROMIDE 10 MG/ML (PF) SYRINGE
PREFILLED_SYRINGE | INTRAVENOUS | Status: DC | PRN
  Administered 2020-01-15: 50 mg via INTRAVENOUS

## 2020-01-15 MED ORDER — MEPERIDINE HCL 50 MG/ML IJ SOLN: 6.2500 mg | INTRAMUSCULAR | Status: DC | PRN

## 2020-01-15 MED ORDER — ASPIRIN 325 MG PO TABS
325.0000 mg | ORAL_TABLET | Freq: Every day | ORAL | Status: DC
  Administered 2020-01-15: 325 mg via ORAL
  Filled 2020-01-15: qty 1

## 2020-01-15 MED ORDER — DEXTROSE 50 % IV SOLN
50.0000 mL | Freq: Once | INTRAVENOUS | Status: AC
  Administered 2020-01-15: 50 mL via INTRAVENOUS

## 2020-01-15 MED ORDER — FENTANYL CITRATE (PF) 100 MCG/2ML IJ SOLN
INTRAMUSCULAR | Status: AC
  Filled 2020-01-15: qty 2

## 2020-01-15 MED ORDER — TORSEMIDE 20 MG PO TABS
20.0000 mg | ORAL_TABLET | Freq: Two times a day (BID) | ORAL | Status: DC
  Administered 2020-01-15 – 2020-01-16 (×2): 20 mg via ORAL
  Filled 2020-01-15 (×2): qty 1

## 2020-01-15 MED ORDER — ONDANSETRON HCL 4 MG/2ML IJ SOLN
INTRAMUSCULAR | Status: AC
  Filled 2020-01-15: qty 2

## 2020-01-15 MED ORDER — SODIUM CHLORIDE 0.9 % IV SOLN: INTRAVENOUS | Status: DC

## 2020-01-15 MED ORDER — CHLORHEXIDINE GLUCONATE 0.12 % MT SOLN
15.0000 mL | Freq: Once | OROMUCOSAL | Status: AC
  Administered 2020-01-15: 15 mL via OROMUCOSAL
  Filled 2020-01-15: qty 15

## 2020-01-15 MED ORDER — ONDANSETRON HCL 4 MG/2ML IJ SOLN: 4.0000 mg | INTRAMUSCULAR | Status: DC | PRN

## 2020-01-15 MED ORDER — DIPHENHYDRAMINE HCL 50 MG/ML IJ SOLN: 12.5000 mg | Freq: Four times a day (QID) | INTRAMUSCULAR | Status: DC | PRN

## 2020-01-15 MED ORDER — SUGAMMADEX SODIUM 500 MG/5ML IV SOLN
INTRAVENOUS | Status: AC
  Filled 2020-01-15: qty 5

## 2020-01-15 MED ORDER — PROPOFOL 10 MG/ML IV BOLUS
INTRAVENOUS | Status: DC | PRN
  Administered 2020-01-15: 150 mg via INTRAVENOUS

## 2020-01-15 MED ORDER — DEXTROSE 50 % IV SOLN
INTRAVENOUS | Status: AC
  Filled 2020-01-15: qty 50

## 2020-01-15 MED ORDER — FENTANYL CITRATE (PF) 100 MCG/2ML IJ SOLN
INTRAMUSCULAR | Status: DC | PRN
  Administered 2020-01-15: 50 ug via INTRAVENOUS

## 2020-01-15 MED ORDER — BELLADONNA ALKALOIDS-OPIUM 16.2-60 MG RE SUPP: 1.0000 | Freq: Four times a day (QID) | RECTAL | Status: DC | PRN

## 2020-01-15 MED ORDER — HYDROMORPHONE HCL 1 MG/ML IJ SOLN: 0.2500 mg | INTRAMUSCULAR | Status: DC | PRN

## 2020-01-15 MED ORDER — SODIUM CHLORIDE 0.9 % IR SOLN
3000.0000 mL | Status: DC
  Administered 2020-01-15: 3000 mL

## 2020-01-15 MED ORDER — ORAL CARE MOUTH RINSE: 15.0000 mL | Freq: Once | OROMUCOSAL | Status: AC

## 2020-01-15 MED ORDER — AMLODIPINE BESYLATE 5 MG PO TABS
10.0000 mg | ORAL_TABLET | Freq: Every day | ORAL | Status: DC
  Administered 2020-01-15 – 2020-01-16 (×2): 10 mg via ORAL
  Filled 2020-01-15 (×2): qty 2

## 2020-01-15 MED ORDER — HYDROMORPHONE HCL 1 MG/ML IJ SOLN: 0.5000 mg | INTRAMUSCULAR | Status: DC | PRN

## 2020-01-15 MED ORDER — GENTAMICIN SULFATE 40 MG/ML IJ SOLN
5.0000 mg/kg | INTRAVENOUS | Status: AC
  Administered 2020-01-15: 500 mg via INTRAVENOUS
  Filled 2020-01-15: qty 12.5

## 2020-01-15 MED ORDER — ACETAMINOPHEN 325 MG PO TABS: 650.0000 mg | ORAL_TABLET | ORAL | Status: DC | PRN

## 2020-01-15 MED ORDER — LACTATED RINGERS IV SOLN: INTRAVENOUS | Status: DC | PRN

## 2020-01-15 MED ORDER — PRAVASTATIN SODIUM 40 MG PO TABS
80.0000 mg | ORAL_TABLET | Freq: Every evening | ORAL | Status: DC
  Administered 2020-01-15: 80 mg via ORAL
  Filled 2020-01-15: qty 2

## 2020-01-15 MED ORDER — WATER FOR IRRIGATION, STERILE IR SOLN
Status: DC | PRN
  Administered 2020-01-15: 500 mL

## 2020-01-15 MED ORDER — DEXAMETHASONE SODIUM PHOSPHATE 10 MG/ML IJ SOLN
INTRAMUSCULAR | Status: AC
  Filled 2020-01-15: qty 1

## 2020-01-15 SURGICAL SUPPLY — 22 items
BAG DRAIN URO TABLE W/ADPT NS (BAG) ×2 IMPLANT
BAG URINE DRAIN TURP 4L (OSTOMY) ×2 IMPLANT
CATH FOLEY 3WAY 30CC 22F (CATHETERS) ×4 IMPLANT
CLOTH BEACON ORANGE TIMEOUT ST (SAFETY) ×2 IMPLANT
ELECT REM PT RETURN 9FT ADLT (ELECTROSURGICAL) ×2
ELECTRODE REM PT RTRN 9FT ADLT (ELECTROSURGICAL) ×1 IMPLANT
GLOVE BIO SURGEON STRL SZ8 (GLOVE) ×2 IMPLANT
GLOVE BIOGEL PI IND STRL 7.0 (GLOVE) ×2 IMPLANT
GLOVE BIOGEL PI INDICATOR 7.0 (GLOVE) ×2
GOWN STRL REUS W/TWL LRG LVL3 (GOWN DISPOSABLE) ×2 IMPLANT
GOWN STRL REUS W/TWL XL LVL3 (GOWN DISPOSABLE) ×2 IMPLANT
IV NS IRRIG 3000ML ARTHROMATIC (IV SOLUTION) ×14 IMPLANT
KIT TURNOVER CYSTO (KITS) ×2 IMPLANT
LOOP CUT BIPOLAR 24F LRG (ELECTROSURGICAL) ×2 IMPLANT
MANIFOLD NEPTUNE II (INSTRUMENTS) ×2 IMPLANT
PACK CYSTO (CUSTOM PROCEDURE TRAY) ×2 IMPLANT
PAD ARMBOARD 7.5X6 YLW CONV (MISCELLANEOUS) ×2 IMPLANT
SYR 30ML LL (SYRINGE) ×2 IMPLANT
SYR TOOMEY IRRIG 70ML (MISCELLANEOUS) ×2
SYRINGE TOOMEY IRRIG 70ML (MISCELLANEOUS) ×1 IMPLANT
TOWEL OR 17X26 4PK STRL BLUE (TOWEL DISPOSABLE) ×2 IMPLANT
WATER STERILE IRR 500ML POUR (IV SOLUTION) ×2 IMPLANT

## 2020-01-15 NOTE — Anesthesia Postprocedure Evaluation (Signed)
Anesthesia Post Note  Patient: Danny [name]  Procedure(s) Performed: TRANSURETHRAL RESECTION OF THE PROSTATE (TURP)  with General anesthesia and spinal (N/A )  Patient location during evaluation: PACU Anesthesia Type: General Level of consciousness: awake and alert, oriented and patient cooperative Pain management: satisfactory to patient Respiratory status: spontaneous breathing and respiratory function stable Cardiovascular status: blood pressure returned to baseline and stable Postop Assessment: no headache, no backache and no apparent nausea or vomiting Anesthetic complications: no   No complications documented.   Last Vitals:  Vitals:   01/15/20 1127 01/15/20 1350  BP: (!) 140/74 (!) 124/63  Pulse: 60 54  Resp: 20 17  Temp: 36.6 C (P) 36.4 C  SpO2: 95% (!) 87%    Last Pain:  Vitals:   01/15/20 1127  TempSrc: Oral  PainSc: 0-No pain                 Iyani Dresner

## 2020-01-15 NOTE — Op Note (Signed)
Preoperative diagnosis: BPH  Postoperative diagnosis: BPH  Procedure: 1 cystoscopy 2. Transurethral resection of the prostate  Attending: Nicolette Bang  Anesthesia: General  Estimated blood loss: Minimal  Drains: 22 French foley  Specimens: 1. Prostate Chips  Antibiotics: Gentamicin  Findings: Bilobar prostate enlargement. Ureteral orifices in normal anatomic location.   Indications: Patient is a 64 year old male with a history of BPH and urinary retention.  After discussing treatment options, they decided proceed with transurethral resection of the prostate.  Procedure her in detail: The patient was brought to the operating room and a brief timeout was done to ensure correct patient, correct procedure, correct site.  General anesthesia was administered patient was placed in dorsal lithotomy position.  Their genitalia was then prepped and draped in usual sterile fashion.  A rigid 49 French cystoscope was passed in the urethra and the bladder.  Bladder was inspected and we noted no masses or lesions.  the ureteral orifices were in the normal orthotopic locations. removed the cystoscope and placed a resectoscope into the bladder. We then turned our attention to the prostate resection. Using the bipolar resectoscope we started at the 12 oclock position on the left lobe and resection to the 6 o'clock position from the bladder neck to the verumontanum. We then did the same resection of the right lobe. Once the resection was complete we then cauterized individual bleeders. We then removed the prostate chips and sent them for pathology.  We then re-inspected the prostatic fossa and found no residual bleeding.  the bladder was then drained, a 22 French foley was placed and this concluded the procedure which was well tolerated by patient.  Complications: None  Condition: Stable, extubated, transferred to PACU  Plan: Patient is admitted overnight with continuous bladder irrigation. If their  urine is clear tomorrow they will be discharged home and followup in 5 days for foley catheter removal and pathology discussion.

## 2020-01-15 NOTE — Transfer of Care (Signed)
Immediate Anesthesia Transfer of Care Note  Patient: Kentrell Hallahan  Procedure(s) Performed: TRANSURETHRAL RESECTION OF THE PROSTATE (TURP)  with General anesthesia and spinal (N/A )  Patient Location: PACU  Anesthesia Type:General  Level of Consciousness: awake, alert , oriented and patient cooperative  Airway & Oxygen Therapy: Patient Spontanous Breathing and Patient connected to face mask oxygen  Post-op Assessment: Report given to RN and Post -op Vital signs reviewed and stable  Post vital signs: Reviewed and stable  Last Vitals:  Vitals Value Taken Time  BP 124/63 01/15/20 1350  Temp    Pulse 51 01/15/20 1352  Resp 15 01/15/20 1352  SpO2 100 % 01/15/20 1352  Vitals shown include unvalidated device data.  Last Pain:  Vitals:   01/15/20 1127  TempSrc: Oral  PainSc: 0-No pain      Patients Stated Pain Goal: 8 (24/93/24 1991)  Complications: No complications documented.

## 2020-01-15 NOTE — Anesthesia Preprocedure Evaluation (Addendum)
Anesthesia Evaluation  Patient identified by MRN, date of birth, ID band Patient awake    Reviewed: Allergy & Precautions, NPO status , Patient's Chart, lab work & pertinent test results, reviewed documented beta blocker date and time   History of Anesthesia Complications Negative for: history of anesthetic complications  Airway Mallampati: III  TM Distance: >3 FB Neck ROM: Full    Dental  (+) Edentulous Upper, Missing, Dental Advisory Given, Poor Dentition,    Pulmonary shortness of breath and with exertion, sleep apnea (snoring) ,    Pulmonary exam normal breath sounds clear to auscultation       Cardiovascular Exercise Tolerance: Poor hypertension, Pt. on medications and Pt. on home beta blockers +CHF and + DOE  Normal cardiovascular exam Rhythm:Regular Rate:Normal  1. The left ventricle has hyperdynamic systolic function, with an  ejection fraction of >65%. The cavity size was normal. There is mildly  increased left ventricular wall thickness. Possible restrictive diastolic  filling pattern. No evidence of left  ventricular regional wall motion abnormalities.  2. The right ventricle has low normal systolic function. The cavity was  normal. There is no increase in right ventricular wall thickness. Right  ventricular systolic pressure is severely elevated with an estimated  pressure of 63.0 mmHg.  3. Left atrial size was moderately dilated.  4. The aortic valve is tricuspid. Moderate aortic annular calcification  noted.  5. The mitral valve is grossly normal. There is mild mitral annular  calcification present.  6. The tricuspid valve is grossly normal.  7. The aorta is normal unless otherwise noted.  8. The inferior vena cava was dilated in size with <50% respiratory  variability.    Neuro/Psych  Neuromuscular disease (diabetic neuropathy)    GI/Hepatic negative GI ROS,   Endo/Other  diabetes, Well Controlled,  Type 2, Insulin DependentMorbid obesity  Renal/GU Renal Insufficiency, CRF and ARFRenal disease     Musculoskeletal  (+) Arthritis ,   Abdominal   Peds  Hematology  (+) Blood dyscrasia, anemia ,   Anesthesia Other Findings   Reproductive/Obstetrics                          Anesthesia Physical Anesthesia Plan  ASA: IV  Anesthesia Plan: General   Post-op Pain Management:    Induction: Intravenous  PONV Risk Score and Plan: 4 or greater and Ondansetron and Metaclopromide  Airway Management Planned: Oral ETT  Additional Equipment:   Intra-op Plan:   Post-operative Plan: Extubation in OR and Possible Post-op intubation/ventilation  Informed Consent: I have reviewed the patients History and Physical, chart, labs and discussed the procedure including the risks, benefits and alternatives for the proposed anesthesia with the patient or authorized representative who has indicated his/her understanding and acceptance.     Dental advisory given  Plan Discussed with: CRNA and Surgeon  Anesthesia Plan Comments:         Anesthesia Quick Evaluation

## 2020-01-15 NOTE — Discharge Instructions (Signed)
Transurethral Resection of the Prostate, Care After This sheet gives you information about how to care for yourself after your procedure. Your health care provider may also give you more specific instructions. If you have problems or questions, contact your health care provider. What can I expect after the procedure? After the procedure, it is common to have:  Mild pain in your lower abdomen.  Soreness or mild discomfort in your penis from having the catheter inserted during the procedure.  A feeling of urgency when you need to urinate.  A small amount of blood in your urine. You may notice some small blood clots in your urine. These are normal. Follow these instructions at home: Medicines  Take over-the-counter and prescription medicines only as told by your health care provider.  If you were prescribed an antibiotic medicine, take it as told by your health care provider. Do not stop taking the antibiotic even if you start to feel better.  Ask your health care provider if the medicine prescribed to you: ? Requires you to avoid driving or using heavy machinery. ? Can cause constipation. You may need to take actions to prevent or treat constipation, such as:  Take over-the-counter or prescription medicines.  Eat foods that are high in fiber, such as fresh fruits and vegetables, whole grains, and beans.  Limit foods that are high in fat and processed sugars, such as fried or sweet foods.  Do not drive for 24 hours if you were given a sedative during your procedure. Activity   Return to your normal activities as told by your health care provider. Ask your health care provider what activities are safe for you.  Do not lift anything that is heavier than 10 lb (4.5 kg), or the limit that you are told, for 3 weeks after the procedure or until your health care provider says that it is safe.  Avoid intense physical activity for as long as told by your health care provider.  Avoid  sitting for a long time without moving. Get up and move around one or more times every few hours. This helps to prevent blood clots. You may increase your physical activity gradually as you start to feel better. Lifestyle  Do not drink alcohol for as long as told by your health care provider. This is especially important if you are taking prescription pain medicines.  Do not engage in sexual activity until your health care provider says that you can do this. General instructions   Do not take baths, swim, or use a hot tub until your health care provider approves.  Drink enough fluid to keep your urine pale yellow.  Urinate as soon as you feel the need to. Do not try to hold your urine for long periods of time.  If your health care provider approves, you may take a stool softener for 2-3 weeks to prevent you from straining to have a bowel movement.  Wear compression stockings as told by your health care provider. These stockings help to prevent blood clots and reduce swelling in your legs.  Keep all follow-up visits as told by your health care provider. This is important. Contact a health care provider if you have:  Difficulty urinating.  A fever.  Pain that gets worse or does not improve with medicine.  Blood in your urine that does not go away after 1 week of resting and drinking more fluids.  Swelling in your penis or testicles. Get help right away if:  You are unable   to urinate.  You are having more blood clots in your urine instead of fewer.  You have: ? Large blood clots. ? A lot of blood in your urine. ? Pain in your back or lower abdomen. ? Pain or swelling in your legs. ? Chills and you are shaking. ? Difficulty breathing or shortness of breath. Summary  After the procedure, it is common to have a small amount of blood in your urine.  Avoid heavy lifting and intense physical activity for as long as told by your health care provider.  Urinate as soon as you  feel the need to. Do not try to hold your urine for long periods of time.  Keep all follow-up visits as told by your health care provider. This is important. This information is not intended to replace advice given to you by your health care provider. Make sure you discuss any questions you have with your health care provider. Document Revised: 10/02/2018 Document Reviewed: 03/13/2018 Elsevier Patient Education  2020 Elsevier Inc.  

## 2020-01-15 NOTE — H&P (Signed)
Urology Admission H&P  Chief Complaint: urinary retention  History of Present Illness: Danny [name] is a 64yo with a hx of BPH with urinary retention here for TURP. He failed multiple voiding trials. He has failed medical therapy  Past Medical History:  Diagnosis Date  . Anemia   . Arthritis   . CKD (chronic kidney disease), stage IV ([place])   . Diabetes mellitus without complication ([place])   . Foot ulcer ([place])   . Hypertension   . Urinary retention    Past Surgical History:  Procedure Laterality Date  . ANKLE SURGERY Right   . CHOLECYSTECTOMY    . FOOT SURGERY Right     Home Medications:  Current Facility-Administered Medications  Medication Dose Route Frequency Provider Last Rate Last Admin  . chlorhexidine (PERIDEX) 0.12 % solution 15 mL  15 mL Mouth/Throat Once Denese Killings, MD       Or  . MEDLINE mouth rinse  15 mL Mouth Rinse Once Battula, Rajamani C, MD      . gentamicin (GARAMYCIN) 500 mg in dextrose 5 % 100 mL IVPB  5 mg/kg (Adjusted) Intravenous 30 min Pre-Op Anabelen Kaminsky, [name], MD      . lactated ringers infusion   Intravenous Once Battula, Rajamani C, MD      . sodium chloride irrigation 0.9 %    PRN Alyson Ingles [name], MD   3,000 mL at 01/15/20 1148  . sodium chloride irrigation 0.9 %    PRN Cleon Gustin, MD   3,000 mL at 01/15/20 1148  . water for irrigation, sterile for irrigation SOLN    PRN Cleon Gustin, MD   500 mL at 01/15/20 1147   Allergies:  Allergies  Allergen Reactions  . Dust Mite Extract Itching and Other (See Comments)    Unknown reaction-potential shortness of breath  . Prednisone Nausea And Vomiting  . Rocephin [Ceftriaxone] Nausea And Vomiting    History reviewed. No pertinent family history. Social History:  reports that he has never smoked. He has never used smokeless tobacco. He reports that he does not drink alcohol and does not use drugs.  Review of Systems  Genitourinary: Positive for difficulty urinating.  All other  systems reviewed and are negative.   Physical Exam:  Vital signs in last 24 hours: Temp:  [97.8 F (36.6 C)] 97.8 F (36.6 C) (07/22 1127) Pulse Rate:  [60] 60 (07/22 1127) Resp:  [20] 20 (07/22 1127) BP: (140)/(74) 140/74 (07/22 1127) SpO2:  [95 %] 95 % (07/22 1127) Physical Exam Constitutional:      Appearance: Normal appearance.  HENT:     Head: Normocephalic and atraumatic.     Nose: Nose normal.  Eyes:     Extraocular Movements: Extraocular movements intact.     Pupils: Pupils are equal, round, and reactive to light.  Cardiovascular:     Rate and Rhythm: Normal rate and regular rhythm.  Pulmonary:     Effort: Pulmonary effort is normal. No respiratory distress.  Abdominal:     General: Abdomen is flat. There is no distension.  Musculoskeletal:        General: No swelling. Normal range of motion.     Cervical back: Normal range of motion and neck supple.  Skin:    General: Skin is warm and dry.  Neurological:     General: No focal deficit present.     Mental Status: He is alert and oriented to person, place, and time.  Psychiatric:  Mood and Affect: Mood normal.        Behavior: Behavior normal.        Thought Content: Thought content normal.        Judgment: Judgment normal.     Laboratory Data:  Results for orders placed or performed during the hospital encounter of 01/15/20 (from the past 24 hour(s))  Glucose, capillary     Status: None   Collection Time: 01/15/20 11:19 AM  Result Value Ref Range   Glucose-Capillary 89 70 - 99 mg/dL   Recent Results (from the past 240 hour(s))  SARS CORONAVIRUS 2 (TAT 6-24 HRS) Nasopharyngeal Nasopharyngeal Swab     Status: None   Collection Time: 01/13/20 11:46 AM   Specimen: Nasopharyngeal Swab  Result Value Ref Range Status   SARS Coronavirus 2 NEGATIVE NEGATIVE Final    Comment: (NOTE) SARS-CoV-2 target nucleic acids are NOT DETECTED.  The SARS-CoV-2 RNA is generally detectable in upper and  lower respiratory specimens during the acute phase of infection. Negative results do not preclude SARS-CoV-2 infection, do not rule out co-infections with other pathogens, and should not be used as the sole basis for treatment or other patient management decisions. Negative results must be combined with clinical observations, patient history, and epidemiological information. The expected result is Negative.  Fact Sheet for Patients: SugarRoll.be  Fact Sheet for Healthcare Providers: https://www.woods-mathews.com/  This test is not yet approved or cleared by the [place] FDA and  has been authorized for detection and/or diagnosis of SARS-CoV-2 by FDA under an Emergency Use Authorization (EUA). This EUA will remain  in effect (meaning this test can be used) for the duration of the COVID-19 declaration under Se ction 564(b)(1) of the Act, 21 U.S.C. section 360bbb-3(b)(1), unless the authorization is terminated or revoked sooner.  Performed at [facility], [place] [address], [place], [place] [address]    Creatinine: Recent Labs    01/13/20 1211  CREATININE 3.63*   Baseline Creatinine: unknwon  Impression/Assessment:  64yo with BPH with urinary retention  Plan:  The risks/benefits/alteranitives to TURP was explained to the patient and he understands and wishes to proceed with surgery  Nicolette Bang 01/15/2020, 11:53 AM

## 2020-01-16 ENCOUNTER — Telehealth: Payer: Self-pay | Admitting: Urology

## 2020-01-16 ENCOUNTER — Encounter (HOSPITAL_COMMUNITY): Payer: Self-pay | Admitting: Urology

## 2020-01-16 DIAGNOSIS — R339 Retention of urine, unspecified: Secondary | ICD-10-CM | POA: Diagnosis not present

## 2020-01-16 DIAGNOSIS — I129 Hypertensive chronic kidney disease with stage 1 through stage 4 chronic kidney disease, or unspecified chronic kidney disease: Secondary | ICD-10-CM | POA: Diagnosis not present

## 2020-01-16 DIAGNOSIS — N400 Enlarged prostate without lower urinary tract symptoms: Secondary | ICD-10-CM | POA: Diagnosis not present

## 2020-01-16 DIAGNOSIS — D649 Anemia, unspecified: Secondary | ICD-10-CM | POA: Diagnosis present

## 2020-01-16 DIAGNOSIS — E1122 Type 2 diabetes mellitus with diabetic chronic kidney disease: Secondary | ICD-10-CM | POA: Diagnosis not present

## 2020-01-16 DIAGNOSIS — N184 Chronic kidney disease, stage 4 (severe): Secondary | ICD-10-CM | POA: Diagnosis not present

## 2020-01-16 LAB — BASIC METABOLIC PANEL
Anion gap: 7 (ref 5–15)
BUN: 50 mg/dL — ABNORMAL HIGH (ref 8–23)
CO2: 25 mmol/L (ref 22–32)
Calcium: 8.3 mg/dL — ABNORMAL LOW (ref 8.9–10.3)
Chloride: 108 mmol/L (ref 98–111)
Creatinine, Ser: 3.59 mg/dL — ABNORMAL HIGH (ref 0.61–1.24)
GFR calc Af Amer: 19 mL/min — ABNORMAL LOW (ref >60)
GFR calc non Af Amer: 17 mL/min — ABNORMAL LOW (ref >60)
Glucose, Bld: 152 mg/dL — ABNORMAL HIGH (ref 70–99)
Potassium: 4 mmol/L (ref 3.5–5.1)
Sodium: 140 mmol/L (ref 135–145)

## 2020-01-16 LAB — CBC
HCT: 27.2 % — ABNORMAL LOW (ref 39.0–52.0)
Hemoglobin: 8.7 g/dL — ABNORMAL LOW (ref 13.0–17.0)
MCH: 28.4 pg (ref 26.0–34.0)
MCHC: 32 g/dL (ref 30.0–36.0)
MCV: 88.9 fL (ref 80.0–100.0)
Platelets: 127 10*3/uL — ABNORMAL LOW (ref 150–400)
RBC: 3.06 MIL/uL — ABNORMAL LOW (ref 4.22–5.81)
RDW: 16 % — ABNORMAL HIGH (ref 11.5–15.5)
WBC: 7.8 10*3/uL (ref 4.0–10.5)
nRBC: 0 % (ref 0.0–0.2)

## 2020-01-16 LAB — GLUCOSE, CAPILLARY: Glucose-Capillary: 131 mg/dL — ABNORMAL HIGH (ref 70–99)

## 2020-01-16 LAB — SURGICAL PATHOLOGY

## 2020-01-16 MED ORDER — TRAMADOL HCL 50 MG PO TABS: 50.0000 mg | ORAL_TABLET | Freq: Four times a day (QID) | ORAL | 0 refills | Status: DC | PRN

## 2020-01-16 MED ORDER — INSULIN ASPART 100 UNIT/ML ~~LOC~~ SOLN
0.0000 [IU] | Freq: Three times a day (TID) | SUBCUTANEOUS | Status: DC
  Administered 2020-01-16: 2 [IU] via SUBCUTANEOUS

## 2020-01-16 MED ORDER — NYSTATIN 100000 UNIT/GM EX OINT
TOPICAL_OINTMENT | Freq: Two times a day (BID) | CUTANEOUS | Status: DC
  Filled 2020-01-16: qty 15

## 2020-01-16 NOTE — Telephone Encounter (Signed)
Pt called with no answer. Message left to return call to office. New number given

## 2020-01-16 NOTE — Telephone Encounter (Signed)
Pt left VM to question whether or not he needs to flush his line and stop taking his Asprin at night. Would like for a nurse to call him back.

## 2020-01-16 NOTE — Telephone Encounter (Signed)
Pt. Wants to know if he needs to keep flushing cath like before sx and if he needs to stop taking his aspirin at night for a while.

## 2020-01-16 NOTE — Discharge Summary (Signed)
Physician Discharge Summary  Patient ID: Danny [name] MRN: [id number] DOB/AGE: 66-04-1956 66 y.o.  Admit date: 01/15/2020 Discharge date: 01/16/2020  Admission Diagnoses:  Benign localized hyperplasia of prostate with urinary retention  Discharge Diagnoses:  Principal Problem:   Benign localized hyperplasia of prostate with urinary retention Active Problems:   CKD (chronic kidney disease)   Chronic anemia   Past Medical History:  Diagnosis Date  . Anemia   . Arthritis   . CKD (chronic kidney disease), stage IV ([place])   . Diabetes mellitus without complication ([place])   . Foot ulcer ([place])   . Hypertension   . Urinary retention     Surgeries: Procedure(s): TRANSURETHRAL RESECTION OF THE PROSTATE (TURP)  with General anesthesia and spinal on 01/15/2020   Consultants (if any):   Discharged Condition: Improved  Hospital Course: Danny [name] is an 64 y.o. male who was admitted 01/15/2020 with a diagnosis of Benign localized hyperplasia of prostate with urinary retention and went to the operating room on 01/15/2020 and underwent the above named procedures.  His urine is clear this morning.  His Hgb is minimally decreased from baseline and his Cr is stable.   He will be discharged home with the foley.   He was given perioperative antibiotics:  Anti-infectives (From admission, onward)   Start     Dose/Rate Route Frequency Ordered Stop   01/15/20 1040  gentamicin (GARAMYCIN) 500 mg in dextrose 5 % 100 mL IVPB        5 mg/kg  100.2 kg (Adjusted) 112.5 mL/hr over 60 Minutes Intravenous 30 min pre-op 01/15/20 1040 01/15/20 1220    .  He was given sequential compression devices for DVT prophylaxis.  He benefited maximally from the hospital stay and there were no complications.    Recent vital signs:  Vitals:   01/15/20 2021 01/16/20 0522  BP:  (!) 138/61  Pulse:  66  Resp:  20  Temp:  98.5 F (36.9 C)  SpO2: 98% 100%    Recent laboratory studies:  Lab Results  Component Value  Date   HGB 8.7 (L) 01/16/2020   HGB 9.8 (L) 01/13/2020   HGB 9.7 (L) 01/13/2020   Lab Results  Component Value Date   WBC 7.8 01/16/2020   PLT 127 (L) 01/16/2020   No results found for: INR Lab Results  Component Value Date   NA 140 01/16/2020   K 4.0 01/16/2020   CL 108 01/16/2020   CO2 25 01/16/2020   BUN 50 (H) 01/16/2020   CREATININE 3.59 (H) 01/16/2020   GLUCOSE 152 (H) 01/16/2020    Discharge Medications:   Allergies as of 01/16/2020      Reactions   Dust Mite Extract Itching, Other (See Comments)   Unknown reaction-potential shortness of breath   Prednisone Nausea And Vomiting   Rocephin [ceftriaxone] Nausea And Vomiting      Medication List    STOP taking these medications   tamsulosin 0.4 MG Caps capsule Commonly known as: FLOMAX     TAKE these medications   Accu-Chek SmartView test strip Generic drug: glucose blood 1 each by Other route as needed.   acetaminophen 500 MG tablet Commonly known as: TYLENOL Take 1,000 mg by mouth every 6 (six) hours as needed for moderate pain or headache.   ALLERGY EYE DROPS OP Place 1 drop into both eyes daily as needed (allergies/dry eyes).   amLODipine 5 MG tablet Commonly known as: NORVASC Take 1 tablet (5 mg total) by mouth at bedtime.  amLODipine 10 MG tablet Commonly known as: NORVASC Take 10 mg by mouth daily.   amoxicillin-clavulanate 875-125 MG tablet Commonly known as: AUGMENTIN Take 1 tablet by mouth 2 (two) times daily.   aspirin 325 MG tablet Take 325 mg by mouth at bedtime.   B-12 2500 MCG Tabs Take 2,500 mcg by mouth daily.   cholecalciferol 25 MCG (1000 UNIT) tablet Commonly known as: VITAMIN D3 Take 1,000 Units by mouth daily.   Darbepoetin Alfa 150 MCG/0.3ML Sosy injection Commonly known as: ARANESP Inject 0.3 mLs (150 mcg total) into the skin every Saturday at 6 PM. What changed: when to take this   diphenhydramine-acetaminophen 25-500 MG Tabs tablet Commonly known as: TYLENOL  PM Take 1 tablet by mouth at bedtime as needed (sleep).   Droplet Insulin Syringe 31G X 5/16" 1 ML Misc Generic drug: Insulin Syringe-Needle U-100   FISH OIL PO Take 2,400 mg by mouth 2 (two) times daily.   fluticasone 50 MCG/ACT nasal spray Commonly known as: FLONASE Place 2 sprays into both nostrils daily as needed for allergies.   folic acid 1 MG tablet Commonly known as: FOLVITE Take 1 tablet (1 mg total) by mouth daily.   insulin aspart protamine- aspart (70-30) 100 UNIT/ML injection Commonly known as: NOVOLOG MIX 70/30 Inject 0.08 mLs (8 Units total) into the skin 3 (three) times daily.   Magnesium 100 MG Caps Take 100 mg by mouth daily.   metoprolol succinate 100 MG 24 hr tablet Commonly known as: TOPROL-XL Take 1 tablet (100 mg total) by mouth every evening.   mirabegron ER 25 MG Tb24 tablet Commonly known as: MYRBETRIQ Take 1 tablet (25 mg total) by mouth daily.   multivitamin with minerals Tabs tablet Take 1 tablet by mouth daily.   NovoLIN 70/30 ReliOn (70-30) 100 UNIT/ML injection Generic drug: insulin NPH-regular Human Inject 20-100 Units into the skin in the morning and at bedtime.   pravastatin 80 MG tablet Commonly known as: PRAVACHOL Take 1 tablet (80 mg total) by mouth every evening.   Retacrit 4000 UNIT/ML injection Generic drug: epoetin alfa-epbx Inject 4,000 Units into the skin every 14 (fourteen) days.   torsemide 20 MG tablet Commonly known as: DEMADEX Take 2 tablets (40 mg total) by mouth 2 (two) times daily. What changed: how much to take   traMADol 50 MG tablet Commonly known as: Ultram Take 1 tablet (50 mg total) by mouth every 6 (six) hours as needed.   Vitamin C 500 MG Chew Chew 1,000 mg by mouth daily.       Diagnostic Studies: No results found.  Disposition: Discharge disposition: 01-Home or Self Care       Discharge Instructions    Discharge patient   Complete by: As directed    Discharge disposition: 01-Home or  Self Care   Discharge patient date: 01/16/2020   Discontinue IV   Complete by: As directed        Follow-up Information    [name], [name], MD. Call on 01/22/2020.   Specialty: Urology Why: voiding trial Contact information: [address] [place] [address] [phone]                Signed: Irine Seal 01/16/2020, 8:25 AM

## 2020-01-20 DIAGNOSIS — L97309 Non-pressure chronic ulcer of unspecified ankle with unspecified severity: Secondary | ICD-10-CM | POA: Diagnosis not present

## 2020-01-20 DIAGNOSIS — E785 Hyperlipidemia, unspecified: Secondary | ICD-10-CM | POA: Diagnosis not present

## 2020-01-20 DIAGNOSIS — E11622 Type 2 diabetes mellitus with other skin ulcer: Secondary | ICD-10-CM | POA: Diagnosis not present

## 2020-01-20 DIAGNOSIS — I130 Hypertensive heart and chronic kidney disease with heart failure and stage 1 through stage 4 chronic kidney disease, or unspecified chronic kidney disease: Secondary | ICD-10-CM | POA: Diagnosis not present

## 2020-01-20 DIAGNOSIS — L97519 Non-pressure chronic ulcer of other part of right foot with unspecified severity: Secondary | ICD-10-CM | POA: Diagnosis not present

## 2020-01-20 DIAGNOSIS — Z87891 Personal history of nicotine dependence: Secondary | ICD-10-CM | POA: Diagnosis not present

## 2020-01-20 DIAGNOSIS — E11621 Type 2 diabetes mellitus with foot ulcer: Secondary | ICD-10-CM | POA: Diagnosis not present

## 2020-01-20 DIAGNOSIS — E1140 Type 2 diabetes mellitus with diabetic neuropathy, unspecified: Secondary | ICD-10-CM | POA: Diagnosis not present

## 2020-01-20 DIAGNOSIS — N184 Chronic kidney disease, stage 4 (severe): Secondary | ICD-10-CM | POA: Diagnosis not present

## 2020-01-20 DIAGNOSIS — E1122 Type 2 diabetes mellitus with diabetic chronic kidney disease: Secondary | ICD-10-CM | POA: Diagnosis not present

## 2020-01-20 DIAGNOSIS — I509 Heart failure, unspecified: Secondary | ICD-10-CM | POA: Diagnosis not present

## 2020-01-20 NOTE — Telephone Encounter (Signed)
He does not need to flush the catheter

## 2020-01-21 NOTE — Telephone Encounter (Signed)
Left message for pt. He is scheduled to come in tomorrow.

## 2020-01-22 ENCOUNTER — Observation Stay (HOSPITAL_COMMUNITY)
Admission: EM | Admit: 2020-01-22 | Discharge: 2020-01-23 | Disposition: A | Discharge status: Home / Self Care | Payer: Medicare HMO | Attending: Urology | Admitting: Urology

## 2020-01-22 ENCOUNTER — Other Ambulatory Visit: Payer: Self-pay

## 2020-01-22 ENCOUNTER — Ambulatory Visit: Payer: Medicare HMO | Admitting: Urology

## 2020-01-22 ENCOUNTER — Emergency Department (HOSPITAL_COMMUNITY): Payer: Medicare HMO

## 2020-01-22 ENCOUNTER — Encounter (HOSPITAL_COMMUNITY): Payer: Self-pay | Admitting: Emergency Medicine

## 2020-01-22 DIAGNOSIS — I130 Hypertensive heart and chronic kidney disease with heart failure and stage 1 through stage 4 chronic kidney disease, or unspecified chronic kidney disease: Secondary | ICD-10-CM | POA: Diagnosis not present

## 2020-01-22 DIAGNOSIS — Z7982 Long term (current) use of aspirin: Secondary | ICD-10-CM | POA: Diagnosis not present

## 2020-01-22 DIAGNOSIS — R52 Pain, unspecified: Secondary | ICD-10-CM | POA: Diagnosis not present

## 2020-01-22 DIAGNOSIS — N3289 Other specified disorders of bladder: Secondary | ICD-10-CM

## 2020-01-22 DIAGNOSIS — N390 Urinary tract infection, site not specified: Secondary | ICD-10-CM | POA: Insufficient documentation

## 2020-01-22 DIAGNOSIS — E1122 Type 2 diabetes mellitus with diabetic chronic kidney disease: Secondary | ICD-10-CM | POA: Diagnosis not present

## 2020-01-22 DIAGNOSIS — R338 Other retention of urine: Secondary | ICD-10-CM | POA: Diagnosis not present

## 2020-01-22 DIAGNOSIS — K4020 Bilateral inguinal hernia, without obstruction or gangrene, not specified as recurrent: Secondary | ICD-10-CM | POA: Diagnosis not present

## 2020-01-22 DIAGNOSIS — R310 Gross hematuria: Secondary | ICD-10-CM | POA: Diagnosis not present

## 2020-01-22 DIAGNOSIS — D649 Anemia, unspecified: Secondary | ICD-10-CM | POA: Diagnosis not present

## 2020-01-22 DIAGNOSIS — Z20822 Contact with and (suspected) exposure to covid-19: Secondary | ICD-10-CM | POA: Insufficient documentation

## 2020-01-22 DIAGNOSIS — N200 Calculus of kidney: Secondary | ICD-10-CM | POA: Diagnosis not present

## 2020-01-22 DIAGNOSIS — I509 Heart failure, unspecified: Secondary | ICD-10-CM | POA: Insufficient documentation

## 2020-01-22 DIAGNOSIS — R1030 Lower abdominal pain, unspecified: Secondary | ICD-10-CM

## 2020-01-22 DIAGNOSIS — R339 Retention of urine, unspecified: Secondary | ICD-10-CM

## 2020-01-22 DIAGNOSIS — E1165 Type 2 diabetes mellitus with hyperglycemia: Secondary | ICD-10-CM | POA: Diagnosis not present

## 2020-01-22 DIAGNOSIS — R319 Hematuria, unspecified: Secondary | ICD-10-CM | POA: Diagnosis not present

## 2020-01-22 DIAGNOSIS — Z794 Long term (current) use of insulin: Secondary | ICD-10-CM | POA: Diagnosis not present

## 2020-01-22 DIAGNOSIS — R1084 Generalized abdominal pain: Secondary | ICD-10-CM | POA: Diagnosis not present

## 2020-01-22 DIAGNOSIS — I10 Essential (primary) hypertension: Secondary | ICD-10-CM | POA: Diagnosis not present

## 2020-01-22 DIAGNOSIS — N184 Chronic kidney disease, stage 4 (severe): Secondary | ICD-10-CM | POA: Diagnosis not present

## 2020-01-22 DIAGNOSIS — A499 Bacterial infection, unspecified: Secondary | ICD-10-CM | POA: Diagnosis present

## 2020-01-22 DIAGNOSIS — Z79899 Other long term (current) drug therapy: Secondary | ICD-10-CM | POA: Insufficient documentation

## 2020-01-22 DIAGNOSIS — I700 Atherosclerosis of aorta: Secondary | ICD-10-CM | POA: Diagnosis not present

## 2020-01-22 LAB — URINALYSIS, ROUTINE W REFLEX MICROSCOPIC

## 2020-01-22 LAB — COMPREHENSIVE METABOLIC PANEL
ALT: 37 U/L (ref 0–44)
AST: 28 U/L (ref 15–41)
Albumin: 3.3 g/dL — ABNORMAL LOW (ref 3.5–5.0)
Alkaline Phosphatase: 103 U/L (ref 38–126)
Anion gap: 9 (ref 5–15)
BUN: 49 mg/dL — ABNORMAL HIGH (ref 8–23)
CO2: 21 mmol/L — ABNORMAL LOW (ref 22–32)
Calcium: 8.8 mg/dL — ABNORMAL LOW (ref 8.9–10.3)
Chloride: 104 mmol/L (ref 98–111)
Creatinine, Ser: 3.77 mg/dL — ABNORMAL HIGH (ref 0.61–1.24)
GFR calc Af Amer: 18 mL/min — ABNORMAL LOW (ref >60)
GFR calc non Af Amer: 16 mL/min — ABNORMAL LOW (ref >60)
Glucose, Bld: 396 mg/dL — ABNORMAL HIGH (ref 70–99)
Potassium: 4.6 mmol/L (ref 3.5–5.1)
Sodium: 134 mmol/L — ABNORMAL LOW (ref 135–145)
Total Bilirubin: 1 mg/dL (ref 0.3–1.2)
Total Protein: 7.4 g/dL (ref 6.5–8.1)

## 2020-01-22 LAB — SAMPLE TO BLOOD BANK

## 2020-01-22 LAB — CBC WITH DIFFERENTIAL/PLATELET
Abs Immature Granulocytes: 0.04 10*3/uL (ref 0.00–0.07)
Basophils Absolute: 0 10*3/uL (ref 0.0–0.1)
Basophils Relative: 0 %
Eosinophils Absolute: 0.1 10*3/uL (ref 0.0–0.5)
Eosinophils Relative: 1 %
HCT: 27 % — ABNORMAL LOW (ref 39.0–52.0)
Hemoglobin: 8.9 g/dL — ABNORMAL LOW (ref 13.0–17.0)
Immature Granulocytes: 0 %
Lymphocytes Relative: 4 %
Lymphs Abs: 0.4 10*3/uL — ABNORMAL LOW (ref 0.7–4.0)
MCH: 28.3 pg (ref 26.0–34.0)
MCHC: 33 g/dL (ref 30.0–36.0)
MCV: 86 fL (ref 80.0–100.0)
Monocytes Absolute: 0.3 10*3/uL (ref 0.1–1.0)
Monocytes Relative: 3 %
Neutro Abs: 9.1 10*3/uL — ABNORMAL HIGH (ref 1.7–7.7)
Neutrophils Relative %: 92 %
Platelets: 168 10*3/uL (ref 150–400)
RBC: 3.14 MIL/uL — ABNORMAL LOW (ref 4.22–5.81)
RDW: 15.9 % — ABNORMAL HIGH (ref 11.5–15.5)
WBC: 10 10*3/uL (ref 4.0–10.5)
nRBC: 0 % (ref 0.0–0.2)

## 2020-01-22 LAB — SARS CORONAVIRUS 2 BY RT PCR (HOSPITAL ORDER, PERFORMED IN ~~LOC~~ HOSPITAL LAB): SARS Coronavirus 2: NEGATIVE

## 2020-01-22 LAB — URINALYSIS, MICROSCOPIC (REFLEX): RBC / HPF: >50 RBC/hpf (ref 0–5)

## 2020-01-22 LAB — LIPASE, BLOOD: Lipase: 47 U/L (ref 11–51)

## 2020-01-22 MED ORDER — TORSEMIDE 20 MG PO TABS
20.0000 mg | ORAL_TABLET | Freq: Two times a day (BID) | ORAL | Status: DC
  Administered 2020-01-22 – 2020-01-23 (×2): 20 mg via ORAL
  Filled 2020-01-22 (×8): qty 1

## 2020-01-22 MED ORDER — OXYCODONE-ACETAMINOPHEN 5-325 MG PO TABS: 1.0000 | ORAL_TABLET | ORAL | Status: DC | PRN

## 2020-01-22 MED ORDER — SODIUM CHLORIDE 0.9 % IV SOLN
1.0000 g | INTRAVENOUS | Status: DC
  Administered 2020-01-23: 1 g via INTRAVENOUS
  Filled 2020-01-22 (×4): qty 1

## 2020-01-22 MED ORDER — SODIUM CHLORIDE 0.9 % IV SOLN
2.0000 g | Freq: Once | INTRAVENOUS | Status: AC
  Administered 2020-01-22: 2 g via INTRAVENOUS
  Filled 2020-01-22: qty 2

## 2020-01-22 MED ORDER — ONDANSETRON HCL 4 MG/2ML IJ SOLN: 4.0000 mg | INTRAMUSCULAR | Status: DC | PRN

## 2020-01-22 MED ORDER — SODIUM CHLORIDE 0.9 % IV SOLN: 2.0000 g | Freq: Two times a day (BID) | INTRAVENOUS | Status: DC

## 2020-01-22 MED ORDER — BELLADONNA ALKALOIDS-OPIUM 16.2-60 MG RE SUPP: 1.0000 | Freq: Four times a day (QID) | RECTAL | Status: DC | PRN

## 2020-01-22 MED ORDER — ONDANSETRON HCL 4 MG/2ML IJ SOLN
4.0000 mg | Freq: Once | INTRAMUSCULAR | Status: AC
  Administered 2020-01-22: 4 mg via INTRAVENOUS
  Filled 2020-01-22: qty 2

## 2020-01-22 MED ORDER — METOPROLOL SUCCINATE ER 25 MG PO TB24
100.0000 mg | ORAL_TABLET | Freq: Every evening | ORAL | Status: DC
  Administered 2020-01-22: 100 mg via ORAL
  Filled 2020-01-22: qty 4

## 2020-01-22 MED ORDER — AMLODIPINE BESYLATE 5 MG PO TABS
10.0000 mg | ORAL_TABLET | Freq: Every day | ORAL | Status: DC
  Administered 2020-01-22 – 2020-01-23 (×2): 10 mg via ORAL
  Filled 2020-01-22 (×2): qty 2

## 2020-01-22 MED ORDER — MORPHINE SULFATE (PF) 4 MG/ML IV SOLN
4.0000 mg | Freq: Once | INTRAVENOUS | Status: AC
  Administered 2020-01-22: 4 mg via INTRAVENOUS
  Filled 2020-01-22: qty 1

## 2020-01-22 MED ORDER — SODIUM CHLORIDE 0.9 % IR SOLN
3000.0000 mL | Status: DC
  Administered 2020-01-22 (×2): 3000 mL

## 2020-01-22 MED ORDER — ZOLPIDEM TARTRATE 5 MG PO TABS: 5.0000 mg | ORAL_TABLET | Freq: Every evening | ORAL | Status: DC | PRN

## 2020-01-22 MED ORDER — DIPHENHYDRAMINE HCL 12.5 MG/5ML PO ELIX: 12.5000 mg | ORAL_SOLUTION | Freq: Four times a day (QID) | ORAL | Status: DC | PRN

## 2020-01-22 MED ORDER — PRAVASTATIN SODIUM 40 MG PO TABS
80.0000 mg | ORAL_TABLET | Freq: Every evening | ORAL | Status: DC
  Administered 2020-01-22: 80 mg via ORAL
  Filled 2020-01-22 (×4): qty 2

## 2020-01-22 MED ORDER — FLUTICASONE PROPIONATE 50 MCG/ACT NA SUSP
2.0000 | Freq: Every day | NASAL | Status: DC | PRN
  Administered 2020-01-22: 2 via NASAL
  Filled 2020-01-22: qty 16

## 2020-01-22 MED ORDER — HYDROMORPHONE HCL 1 MG/ML IJ SOLN: 0.5000 mg | INTRAMUSCULAR | Status: DC | PRN

## 2020-01-22 MED ORDER — SODIUM CHLORIDE 0.9 % IR SOLN
3000.0000 mL | Status: DC
  Administered 2020-01-22: 3000 mL

## 2020-01-22 MED ORDER — DIPHENHYDRAMINE HCL 50 MG/ML IJ SOLN: 12.5000 mg | Freq: Four times a day (QID) | INTRAMUSCULAR | Status: DC | PRN

## 2020-01-22 MED ORDER — SODIUM CHLORIDE 0.9 % IV SOLN: INTRAVENOUS | Status: DC

## 2020-01-22 NOTE — ED Notes (Signed)
Dr Alyson Ingles at bedside.

## 2020-01-22 NOTE — H&P (Signed)
Urology Admission H&P  Chief Complaint: Gross hematuria  History of Present Illness: Mr Danny [name] is a 64yo with a hx of BPH s/p TURP 1 week ago who presented to the ER this morning with gross hematuria and a nondraining foley catheter. He was discharged 1 week ago after TURP with clear urine and his urine was clear until last night. He has been doing prn foley flushes. Starting this morning his catheter stopped draining. Currently he is in severe suprpapubic pain. He had a low grade temperature 99 last night. No other associated symptoms. CT renal Study from today shows a distended bladder with significant clot burden. UA is concerning for infection  Past Medical History:  Diagnosis Date  . Anemia   . Arthritis   . CKD (chronic kidney disease), stage IV ([place])   . Diabetes mellitus without complication ([place])   . Foot ulcer ([place])   . Hypertension   . Urinary retention    Past Surgical History:  Procedure Laterality Date  . ANKLE SURGERY Right   . CHOLECYSTECTOMY    . FOOT SURGERY Right   . TRANSURETHRAL RESECTION OF PROSTATE N/A 01/15/2020   Procedure: TRANSURETHRAL RESECTION OF THE PROSTATE (TURP)  with General anesthesia and spinal;  Surgeon: Cleon Gustin, MD;  Location: [facility];  Service: Urology;  Laterality: N/A;    Home Medications:  Current Facility-Administered Medications  Medication Dose Route Frequency Provider Last Rate Last Admin  . 0.9 %  sodium chloride infusion   Intravenous Continuous Cleon Gustin, MD 10 mL/hr at 01/22/20 1515 New Bag at 01/22/20 1515  . amLODipine (NORVASC) tablet 10 mg  10 mg Oral Daily Cleon Gustin, MD   10 mg at 01/22/20 1518  . [START ON 01/23/2020] ceFEPIme (MAXIPIME) 1 g in sodium chloride 0.9 % 100 mL IVPB  1 g Intravenous Q24H Diva Lemberger, [name], MD      . diphenhydrAMINE (BENADRYL) injection 12.5 mg  12.5 mg Intravenous Q6H PRN Gerson Fauth, [name], MD       Or  . diphenhydrAMINE (BENADRYL) 12.5 MG/5ML elixir 12.5 mg  12.5 mg  Oral Q6H PRN Tewana Bohlen, [name], MD      . fluticasone (FLONASE) 50 MCG/ACT nasal spray 2 spray  2 spray Each Nare Daily PRN Vedh Ptacek, [name], MD      . HYDROmorphone (DILAUDID) injection 0.5-1 mg  0.5-1 mg Intravenous Q2H PRN Tymira Horkey, [name], MD      . metoprolol succinate (TOPROL-XL) 24 hr tablet 100 mg  100 mg Oral QPM Ramsha Lonigro, [name], MD      . ondansetron (ZOFRAN) injection 4 mg  4 mg Intravenous Q4H PRN Yeng Perz, [name], MD      . opium-belladonna (B&O) suppository 16.2-60mg   1 suppository Rectal Q6H PRN Cleon Gustin, MD      . oxyCODONE-acetaminophen (PERCOCET/ROXICET) 5-325 MG per tablet 1-2 tablet  1-2 tablet Oral Q4H PRN Cleon Gustin, MD      . pravastatin (PRAVACHOL) tablet 80 mg  80 mg Oral QPM Tykesha Konicki, [name], MD      . sodium chloride irrigation 0.9 % 3,000 mL  3,000 mL Irrigation Continuous Long, Wonda Olds, MD   3,000 mL at 01/22/20 1140  . sodium chloride irrigation 0.9 % 3,000 mL  3,000 mL Irrigation Continuous Orie Baxendale, [name], MD      . torsemide (DEMADEX) tablet 20 mg  20 mg Oral BID Alyson Ingles [name], MD      . zolpidem [facility]) tablet 5  mg  5 mg Oral QHS PRN Alyson Ingles [name], MD       Current Outpatient Medications  Medication Sig Dispense Refill Last Dose  . ACCU-CHEK SMARTVIEW test strip 1 each by Other route as needed.      Marland Kitchen acetaminophen (TYLENOL) 500 MG tablet Take 1,000 mg by mouth every 6 (six) hours as needed for moderate pain or headache.     Marland Kitchen amLODipine (NORVASC) 10 MG tablet Take 10 mg by mouth daily.    01/21/2020 at Unknown time  . Ascorbic Acid (VITAMIN C) 500 MG CHEW Chew 1,000 mg by mouth daily.   01/21/2020 at Unknown time  . aspirin 325 MG tablet Take 325 mg by mouth at bedtime.    01/21/2020 at 0800  . cholecalciferol (VITAMIN D3) 25 MCG (1000 UNIT) tablet Take 1,000 Units by mouth daily.   01/21/2020 at Unknown time  . Darbepoetin Alfa (ARANESP) 150 MCG/0.3ML SOSY injection Inject 0.3 mLs (150 mcg total) into the  skin every Saturday at 6 PM. (Patient taking differently: Inject 150 mcg into the skin every 14 (fourteen) days. )     . DROPLET INSULIN SYRINGE 31G X 5/16" 1 ML MISC      . fluticasone (FLONASE) 50 MCG/ACT nasal spray Place 2 sprays into both nostrils daily as needed for allergies.      . Ketotifen Fumarate (ALLERGY EYE DROPS OP) Place 1 drop into both eyes daily as needed (allergies/dry eyes).     . Magnesium 100 MG CAPS Take 100 mg by mouth daily.    01/21/2020 at Unknown time  . metoprolol succinate (TOPROL-XL) 100 MG 24 hr tablet Take 1 tablet (100 mg total) by mouth every evening. 30 tablet 0 01/21/2020 at 2000  . Multiple Vitamin (MULTIVITAMIN WITH MINERALS) TABS tablet Take 1 tablet by mouth daily.   01/21/2020 at Unknown time  . NOVOLIN 70/30 RELION (70-30) 100 UNIT/ML injection Inject 20-100 Units into the skin in the morning and at bedtime.    01/21/2020 at Unknown time  . Omega-3 Fatty Acids (FISH OIL PO) Take 2,400 mg by mouth 2 (two) times daily.   01/21/2020 at Unknown time  . pravastatin (PRAVACHOL) 80 MG tablet Take 1 tablet (80 mg total) by mouth every evening. 30 tablet 0 01/21/2020 at Unknown time  . torsemide (DEMADEX) 20 MG tablet Take 2 tablets (40 mg total) by mouth 2 (two) times daily. (Patient taking differently: Take 20 mg by mouth 2 (two) times daily. )   01/21/2020 at Unknown time  . traMADol (ULTRAM) 50 MG tablet Take 1 tablet (50 mg total) by mouth every 6 (six) hours as needed. 15 tablet 0 01/22/2020 at Unknown time  . vitamin B-12 (CYANOCOBALAMIN) 1000 MCG tablet Take 1,000 mcg by mouth daily.   01/21/2020 at Unknown time  . amLODipine (NORVASC) 5 MG tablet Take 1 tablet (5 mg total) by mouth at bedtime. (Patient not taking: Reported on 01/05/2020)   Not Taking at Unknown time  . amoxicillin-clavulanate (AUGMENTIN) 875-125 MG tablet Take 1 tablet by mouth 2 (two) times daily. (Patient not taking: Reported on 01/22/2020)   Completed Course at Unknown time  . epoetin alfa-epbx  (RETACRIT) 4000 UNIT/ML injection Inject 4,000 Units into the skin every 14 (fourteen) days.  (Patient not taking: Reported on 01/22/2020)   Not Taking at Unknown time  . folic acid (FOLVITE) 1 MG tablet Take 1 tablet (1 mg total) by mouth daily. (Patient not taking: Reported on 01/05/2020) 30 tablet 0   .  insulin aspart protamine- aspart (NOVOLOG MIX 70/30) (70-30) 100 UNIT/ML injection Inject 0.08 mLs (8 Units total) into the skin 3 (three) times daily. (Patient not taking: Reported on 01/05/2020)   Not Taking at Unknown time  . mirabegron ER (MYRBETRIQ) 25 MG TB24 tablet Take 1 tablet (25 mg total) by mouth daily. (Patient not taking: Reported on 01/05/2020) 30 tablet 1 Not Taking at Unknown time   Allergies:  Allergies  Allergen Reactions  . Dust Mite Extract Itching and Other (See Comments)    Unknown reaction-potential shortness of breath  . Prednisone Nausea And Vomiting  . Rocephin [Ceftriaxone] Nausea And Vomiting    History reviewed. No pertinent family history. Social History:  reports that he has never smoked. He has never used smokeless tobacco. He reports that he does not drink alcohol and does not use drugs.  Review of Systems  Genitourinary: Positive for difficulty urinating and hematuria.  All other systems reviewed and are negative.   Physical Exam:  Vital signs in last 24 hours: Temp:  [97.3 F (36.3 C)] 97.3 F (36.3 C) (07/29 0740) Pulse Rate:  [59-70] 59 (07/29 1404) Resp:  [12] 12 (07/29 0740) BP: (126-179)/(67-73) 142/67 (07/29 1400) SpO2:  [88 %-100 %] 93 % (07/29 1404) Weight:  [129.3 kg] 129.3 kg (07/29 0738) Physical Exam Constitutional:      Appearance: Normal appearance.  HENT:     Head: Normocephalic and atraumatic.     Nose: Nose normal.  Eyes:     Extraocular Movements: Extraocular movements intact.     Pupils: Pupils are equal, round, and reactive to light.  Cardiovascular:     Rate and Rhythm: Normal rate and regular rhythm.  Pulmonary:      Effort: Pulmonary effort is normal. No respiratory distress.  Abdominal:     General: Abdomen is flat. There is no distension.  Genitourinary:    Penis: Normal.      Testes: Normal.  Musculoskeletal:        General: No swelling. Normal range of motion.     Cervical back: Normal range of motion and neck supple.  Skin:    General: Skin is warm and dry.  Neurological:     General: No focal deficit present.     Mental Status: He is alert and oriented to person, place, and time.  Psychiatric:        Mood and Affect: Mood normal.        Behavior: Behavior normal.        Thought Content: Thought content normal.        Judgment: Judgment normal.     Laboratory Data:  Results for orders placed or performed during the hospital encounter of 01/22/20 (from the past 24 hour(s))  Comprehensive metabolic panel     Status: Abnormal   Collection Time: 01/22/20  8:14 AM  Result Value Ref Range   Sodium 134 (L) 135 - 145 mmol/L   Potassium 4.6 3.5 - 5.1 mmol/L   Chloride 104 98 - 111 mmol/L   CO2 21 (L) 22 - 32 mmol/L   Glucose, Bld 396 (H) 70 - 99 mg/dL   BUN 49 (H) 8 - 23 mg/dL   Creatinine, Ser 3.77 (H) 0.61 - 1.24 mg/dL   Calcium 8.8 (L) 8.9 - 10.3 mg/dL   Total Protein 7.4 6.5 - 8.1 g/dL   Albumin 3.3 (L) 3.5 - 5.0 g/dL   AST 28 15 - 41 U/L   ALT 37 0 - 44 U/L  Alkaline Phosphatase 103 38 - 126 U/L   Total Bilirubin 1.0 0.3 - 1.2 mg/dL   GFR calc non Af Amer 16 (L) >60 mL/min   GFR calc Af Amer 18 (L) >60 mL/min   Anion gap 9 5 - 15  Lipase, blood     Status: None   Collection Time: 01/22/20  8:14 AM  Result Value Ref Range   Lipase 47 11 - 51 U/L  CBC with Differential     Status: Abnormal   Collection Time: 01/22/20  8:14 AM  Result Value Ref Range   WBC 10.0 4.0 - 10.5 K/uL   RBC 3.14 (L) 4.22 - 5.81 MIL/uL   Hemoglobin 8.9 (L) 13.0 - 17.0 g/dL   HCT 27.0 (L) 39 - 52 %   MCV 86.0 80.0 - 100.0 fL   MCH 28.3 26.0 - 34.0 pg   MCHC 33.0 30.0 - 36.0 g/dL   RDW 15.9 (H) 11.5  - 15.5 %   Platelets 168 150 - 400 K/uL   nRBC 0.0 0.0 - 0.2 %   Neutrophils Relative % 92 %   Neutro Abs 9.1 (H) 1.7 - 7.7 K/uL   Lymphocytes Relative 4 %   Lymphs Abs 0.4 (L) 0.7 - 4.0 K/uL   Monocytes Relative 3 %   Monocytes Absolute 0.3 0 - 1 K/uL   Eosinophils Relative 1 %   Eosinophils Absolute 0.1 0 - 0 K/uL   Basophils Relative 0 %   Basophils Absolute 0.0 0 - 0 K/uL   Immature Granulocytes 0 %   Abs Immature Granulocytes 0.04 0.00 - 0.07 K/uL  Urinalysis, Routine w reflex microscopic     Status: Abnormal   Collection Time: 01/22/20  8:52 AM  Result Value Ref Range   Color, Urine RED (A) YELLOW   APPearance TURBID (A) CLEAR   Specific Gravity, Urine  1.005 - 1.030    TEST NOT REPORTED DUE TO COLOR INTERFERENCE OF URINE PIGMENT   pH  5.0 - 8.0    TEST NOT REPORTED DUE TO COLOR INTERFERENCE OF URINE PIGMENT   Glucose, UA (A) NEGATIVE mg/dL    TEST NOT REPORTED DUE TO COLOR INTERFERENCE OF URINE PIGMENT   Hgb urine dipstick (A) NEGATIVE    TEST NOT REPORTED DUE TO COLOR INTERFERENCE OF URINE PIGMENT   Bilirubin Urine (A) NEGATIVE    TEST NOT REPORTED DUE TO COLOR INTERFERENCE OF URINE PIGMENT   Ketones, ur (A) NEGATIVE mg/dL    TEST NOT REPORTED DUE TO COLOR INTERFERENCE OF URINE PIGMENT   Protein, ur (A) NEGATIVE mg/dL    TEST NOT REPORTED DUE TO COLOR INTERFERENCE OF URINE PIGMENT   Nitrite (A) NEGATIVE    TEST NOT REPORTED DUE TO COLOR INTERFERENCE OF URINE PIGMENT   Leukocytes,Ua (A) NEGATIVE    TEST NOT REPORTED DUE TO COLOR INTERFERENCE OF URINE PIGMENT  Urinalysis, Microscopic (reflex)     Status: Abnormal   Collection Time: 01/22/20  8:52 AM  Result Value Ref Range   RBC / HPF >50 0 - 5 RBC/hpf   WBC, UA 6-10 0 - 5 WBC/hpf   Bacteria, UA RARE (A) NONE SEEN   Squamous Epithelial / LPF 0-5 0 - 5  SARS Coronavirus 2 by RT PCR (hospital order, performed in [place] hospital lab) Nasopharyngeal Nasopharyngeal Swab     Status: None   Collection Time: 01/22/20   9:51 AM   Specimen: Nasopharyngeal Swab  Result Value Ref Range   SARS Coronavirus 2  NEGATIVE NEGATIVE   Recent Results (from the past 240 hour(s))  SARS CORONAVIRUS 2 (TAT 6-24 HRS) Nasopharyngeal Nasopharyngeal Swab     Status: None   Collection Time: 01/13/20 11:46 AM   Specimen: Nasopharyngeal Swab  Result Value Ref Range Status   SARS Coronavirus 2 NEGATIVE NEGATIVE Final    Comment: (NOTE) SARS-CoV-2 target nucleic acids are NOT DETECTED.  The SARS-CoV-2 RNA is generally detectable in upper and lower respiratory specimens during the acute phase of infection. Negative results do not preclude SARS-CoV-2 infection, do not rule out co-infections with other pathogens, and should not be used as the sole basis for treatment or other patient management decisions. Negative results must be combined with clinical observations, patient history, and epidemiological information. The expected result is Negative.  Fact Sheet for Patients: SugarRoll.be  Fact Sheet for Healthcare Providers: https://www.woods-mathews.com/  This test is not yet approved or cleared by the [place] FDA and  has been authorized for detection and/or diagnosis of SARS-CoV-2 by FDA under an Emergency Use Authorization (EUA). This EUA will remain  in effect (meaning this test can be used) for the duration of the COVID-19 declaration under Se ction 564(b)(1) of the Act, 21 U.S.C. section 360bbb-3(b)(1), unless the authorization is terminated or revoked sooner.  Performed at [facility], [address], [place], [place] [address]   SARS Coronavirus 2 by RT PCR (hospital order, performed in [facility] hospital lab) Nasopharyngeal Nasopharyngeal Swab     Status: None   Collection Time: 01/22/20  9:51 AM   Specimen: Nasopharyngeal Swab  Result Value Ref Range Status   SARS Coronavirus 2 NEGATIVE NEGATIVE Final    Comment: (NOTE) SARS-CoV-2 target nucleic acids  are NOT DETECTED.  The SARS-CoV-2 RNA is generally detectable in upper and lower respiratory specimens during the acute phase of infection. The lowest concentration of SARS-CoV-2 viral copies this assay can detect is 250 copies / mL. A negative result does not preclude SARS-CoV-2 infection and should not be used as the sole basis for treatment or other patient management decisions.  A negative result may occur with improper specimen collection / handling, submission of specimen other than nasopharyngeal swab, presence of viral mutation(s) within the areas targeted by this assay, and inadequate number of viral copies (<250 copies / mL). A negative result must be combined with clinical observations, patient history, and epidemiological information.  Fact Sheet for Patients:   StrictlyIdeas.no  Fact Sheet for Healthcare Providers: BankingDealers.co.za  This test is not yet approved or  cleared by the [place] FDA and has been authorized for detection and/or diagnosis of SARS-CoV-2 by FDA under an Emergency Use Authorization (EUA).  This EUA will remain in effect (meaning this test can be used) for the duration of the COVID-19 declaration under Section 564(b)(1) of the Act, 21 U.S.C. section 360bbb-3(b)(1), unless the authorization is terminated or revoked sooner.  Performed at [facility], [address] [place], [place] [address]    Creatinine: Recent Labs    01/16/20 1884 01/22/20 0814  CREATININE 3.59* 3.77*   Baseline Creatinine: 3.6  Impression/Assessment:  64yo with BPH and clot retention  Plan:  1. The patient's foley catheter was exchanged for a 24 french hematuria catheter and 800cc of clot was irrigated from the bladder. He was started on CBI and urine has cleared. He will be admitted overnight for continued CBI 2. We will start cefepime for presumed UTI.   Nicolette Bang 01/22/2020, 3:57 PM

## 2020-01-22 NOTE — ED Triage Notes (Signed)
Per EMS, pt had "prostate procedure a few days ago" and reports last night around 11pm started having pain and increased blood in urinary catheter. Pt also reports increased BLE weakness.

## 2020-01-22 NOTE — ED Provider Notes (Signed)
Emergency Department Provider Note   I have reviewed the triage vital signs and the nursing notes.   HISTORY  Chief Complaint Weakness   HPI Danny [name] is a 64 y.o. male with past medical history reviewed below presents emergency department with sudden worsening lower abdominal pain in the setting of recent TURP with Dr. Alyson Ingles. Patient is currently POD 7 and has had a uneventful postoperative course until last night.  Patient states that his hematuria gradually improved after surgery but returned yesterday.  He flushed his catheter without pain or other discomfort.  He states he went to sit on the couch and then had sudden severe pain in his lower abdomen which is been continued throughout the night. No radiation of symptoms or clear modifying factors. He denies any fevers but does note feeling warm yesterday.  He has not had shaking chills or diaphoresis.  No chest pain or shortness of breath.  No upper abdomen or back pain.  He continues to put out gross hematuria in his Foley.   Past Medical History:  Diagnosis Date  . Anemia   . Arthritis   . CKD (chronic kidney disease), stage IV ([place])   . Diabetes mellitus without complication ([place])   . Foot ulcer ([place])   . Hypertension   . Urinary retention     Patient Active Problem List   Diagnosis Date Noted  . UTI (urinary tract infection), bacterial 01/22/2020  . Chronic anemia 01/16/2020  . Benign localized hyperplasia of prostate with urinary retention 01/15/2020  . AKI (acute kidney injury) ([place]) 07/08/2019  . Hypoxia 07/08/2019  . Stage I pressure ulcer of right ankle 07/08/2019  . Edema of both lower extremities   . Anasarca 03/23/2019  . Bladder outlet obstruction 03/23/2019  . Yeast infection of the skin 03/16/2019  . Diabetic ulcer of left foot ([place]) 03/15/2019  . Diabetic ulcer of right ankle ([place]) 03/15/2019  . Hypertension associated with stage 3 chronic kidney disease due to type 2 diabetes mellitus ([place])  03/09/2019  . Controlled type 2 diabetes mellitus with stage 3 chronic kidney disease, with Autumm Hattery-term current use of insulin ([place]) 03/09/2019  . Dyslipidemia associated with type 2 diabetes mellitus ([place]) 03/09/2019  . Chronic gout due to renal impairment without tophus 03/09/2019  . Emphysematous cystitis 03/05/2019  . Bilateral hydronephrosis   . Bilateral cellulitis of lower leg 03/03/2019  . Urinary retention 03/03/2019  . Klebsiella Cystitis 03/03/2019  . UTI due to Klebsiella species 03/03/2019  . Plantar ulcer of left foot ([place]) 03/03/2019  . Acute on chronic diastolic (congestive) heart failure ([place]) 02/25/2019  . Acute respiratory failure with hypoxia ([place]) 02/25/2019  . Acute renal failure superimposed on stage 3 chronic kidney disease ([place]) 02/24/2019  . Congestive heart failure ([place]) 02/24/2019  . Dyspnea 02/23/2019  . Essential hypertension 02/23/2019  . Diabetes mellitus ([place]) 02/23/2019  . CKD (chronic kidney disease) 02/23/2019  . Psoriasis 02/23/2019    Past Surgical History:  Procedure Laterality Date  . ANKLE SURGERY Right   . CHOLECYSTECTOMY    . FOOT SURGERY Right   . TRANSURETHRAL RESECTION OF PROSTATE N/A 01/15/2020   Procedure: TRANSURETHRAL RESECTION OF THE PROSTATE (TURP)  with General anesthesia and spinal;  Surgeon: Cleon Gustin, MD;  Location: [facility];  Service: Urology;  Laterality: N/A;    Allergies Dust mite extract, Prednisone, and Rocephin [ceftriaxone]  History reviewed. No pertinent family history.  Social History Social History   Tobacco Use  . Smoking status: Never  Smoker  . Smokeless tobacco: Never Used  Vaping Use  . Vaping Use: Never used  Substance Use Topics  . Alcohol use: No  . Drug use: No    Review of Systems  Constitutional: No fever/chills Eyes: No visual changes. ENT: No sore throat. Cardiovascular: Denies chest pain. Respiratory: Denies shortness of breath. Gastrointestinal: Positive lower abdominal pain.   No nausea, no vomiting.  No diarrhea.  No constipation. Genitourinary: Positive gross hematuria.  Musculoskeletal: Negative for back pain. Skin: Negative for rash. Neurological: Negative for headaches, focal weakness or numbness.  10-point ROS otherwise negative.  ____________________________________________   PHYSICAL EXAM:  VITAL SIGNS: ED Triage Vitals  Enc Vitals Group     BP 01/22/20 0740 (!) 179/71     Pulse Rate 01/22/20 0740 69     Resp 01/22/20 0740 12     Temp 01/22/20 0740 (!) 97.3 F (36.3 C)     Temp Source 01/22/20 0740 Oral     SpO2 01/22/20 0740 95 %     Weight 01/22/20 0738 (!) 285 lb (129.3 kg)     Height 01/22/20 0738 6\' 1"  (1.854 m)   Constitutional: Alert and oriented. Well appearing and in no acute distress. Eyes: Conjunctivae are normal.  Head: Atraumatic. Nose: No congestion/rhinnorhea. Mouth/Throat: Mucous membranes are moist.   Neck: No stridor.  Cardiovascular: Normal rate, regular rhythm. Good peripheral circulation. Grossly normal heart sounds.   Respiratory: Normal respiratory effort.  No retractions. Lungs CTAB. Gastrointestinal: Soft with mild tenderness in the lower abdomen. No distention.  Genitourinary: Foley cath in place with watery hematuria in the bag.  Musculoskeletal: No gross deformities of extremities. Neurologic:  Normal speech and language.  Skin:  Skin is warm, dry and intact. No rash noted.   ____________________________________________   LABS (all labs ordered are listed, but only abnormal results are displayed)  Labs Reviewed  COMPREHENSIVE METABOLIC PANEL - Abnormal; Notable for the following components:      Result Value   Sodium 134 (*)    CO2 21 (*)    Glucose, Bld 396 (*)    BUN 49 (*)    Creatinine, Ser 3.77 (*)    Calcium 8.8 (*)    Albumin 3.3 (*)    GFR calc non Af Amer 16 (*)    GFR calc Af Amer 18 (*)    All other components within normal limits  CBC WITH DIFFERENTIAL/PLATELET - Abnormal; Notable  for the following components:   RBC 3.14 (*)    Hemoglobin 8.9 (*)    HCT 27.0 (*)    RDW 15.9 (*)    Neutro Abs 9.1 (*)    Lymphs Abs 0.4 (*)    All other components within normal limits  URINALYSIS, ROUTINE W REFLEX MICROSCOPIC - Abnormal; Notable for the following components:   Color, Urine RED (*)    APPearance TURBID (*)    Glucose, UA   (*)    Value: TEST NOT REPORTED DUE TO COLOR INTERFERENCE OF URINE PIGMENT   Hgb urine dipstick   (*)    Value: TEST NOT REPORTED DUE TO COLOR INTERFERENCE OF URINE PIGMENT   Bilirubin Urine   (*)    Value: TEST NOT REPORTED DUE TO COLOR INTERFERENCE OF URINE PIGMENT   Ketones, ur   (*)    Value: TEST NOT REPORTED DUE TO COLOR INTERFERENCE OF URINE PIGMENT   Protein, ur   (*)    Value: TEST NOT REPORTED DUE TO COLOR INTERFERENCE OF URINE PIGMENT  Nitrite   (*)    Value: TEST NOT REPORTED DUE TO COLOR INTERFERENCE OF URINE PIGMENT   Leukocytes,Ua   (*)    Value: TEST NOT REPORTED DUE TO COLOR INTERFERENCE OF URINE PIGMENT   All other components within normal limits  URINALYSIS, MICROSCOPIC (REFLEX) - Abnormal; Notable for the following components:   Bacteria, UA RARE (*)    All other components within normal limits  SARS CORONAVIRUS 2 BY RT PCR (HOSPITAL ORDER, [facility])  URINE CULTURE  LIPASE, BLOOD  SAMPLE TO BLOOD BANK   ____________________________________________  RADIOLOGY  CT Renal Stone Study  Result Date: 01/22/2020 CLINICAL DATA:  Flank pain. EXAM: CT ABDOMEN AND PELVIS WITHOUT CONTRAST TECHNIQUE: Multidetector CT imaging of the abdomen and pelvis was performed following the standard protocol without IV contrast. COMPARISON:  February 28, 2019 FINDINGS: Lower chest: Very mild atelectasis is seen within the bilateral lung bases. Small bilateral pleural effusions are seen, right greater than left. Hepatobiliary: No focal liver abnormality is seen. Status post cholecystectomy. No biliary dilatation.  Pancreas: Unremarkable. No pancreatic ductal dilatation or surrounding inflammatory changes. Spleen: Normal in size without focal abnormality. Adrenals/Urinary Tract: The right adrenal gland is normal in appearance. A 3.4 cm x 3.1 cm low-attenuation left adrenal mass is seen. Kidneys are normal in size. A 2.7 cm exophytic cyst is seen along the anterolateral aspect of the mid right kidney. A subcentimeter left renal cyst is noted. A stable, approximately 1.0 cm ill-defined mildly hyperdense focus is seen within the anteromedial aspect of the mid left kidney (axial CT image 40, CT series number 2). 2 mm and 3 mm nonobstructing renal stones are seen within the right kidney. Additional 2 mm nonobstructing renal stones are seen within the lower pole of the left kidney. A moderate to marked amount of heterogeneous hyperdense material is seen within the dependent portion of a moderately distended urinary bladder. A mild amount of intraluminal air is seen as well as the distal tip and insufflator bulb of a Foley catheter. A mild amount of inflammatory fat stranding is seen anterior to the urinary bladder. No urinary bladder wall thickening is noted. Stomach/Bowel: Stomach is within normal limits. The appendix is not clearly identified. No evidence of bowel wall thickening, distention, or inflammatory changes. Vascular/Lymphatic: There is mild to moderate severity calcification of the abdominal aorta and bilateral common iliac arteries. No enlarged abdominal or pelvic lymph nodes. Reproductive: There is very mild enlargement of the prostate gland. Other: A 2.7 cm x 1.8 cm fat containing right inguinal hernia is seen. An additional 4.5 cm x 4.1 cm fat containing left inguinal hernia is noted. No free fluid is seen. Musculoskeletal: Mild subcutaneous inflammatory fat stranding is seen along the midline of the anterior pelvic wall. Moderate severity multilevel degenerative changes are noted throughout the lumbar spine with  vacuum disc phenomenon seen at the levels of L2-L3 and L3-L4. IMPRESSION: 1. Moderate to marked amount of heterogeneous hyperdense material within the dependent portion of a moderately distended urinary bladder. While this is suspicious for hemorrhagic material, the presence of an underlying neoplastic process cannot be excluded. Correlation with cystoscopy is recommended. 2. Bilateral nonobstructing renal stones. 3. Stable low-attenuation left adrenal mass which may represent an adrenal adenoma. 4. Small bilateral pleural effusions, right greater than left. 5. Bilateral fat containing inguinal hernias. 6. Mild to moderate severity calcification of the abdominal aorta and bilateral common iliac arteries. Aortic Atherosclerosis (ICD10-I70.0). Electronically Signed   By: Hoover Browns  [place] M.D.   On: 01/22/2020 09:40    ____________________________________________   PROCEDURES  Procedure(s) performed:   Procedures  None  ____________________________________________   INITIAL IMPRESSION / ASSESSMENT AND PLAN / ED COURSE  Pertinent labs & imaging results that were available during my care of the patient were reviewed by me and considered in my medical decision making (see chart for details).   Patient presents to the emergency department for evaluation of lower abdominal pain with hematuria.  Bladder scan on arrival shows greater than 700 mL of urine in the bladder.  Plan to irrigate the Foley catheter with possible clots blocking the catheter.  Patient is 7 days postop and having some subjective fevers yesterday.  Plan for labs and CT imaging.   Attempted to flush the foley without success. Concern for acute urinary retention. Will replace foley cath and reassess.   10:00 AM   discussed the case with radiology after review of the CT imaging.  Also discussed case with Dr. Junious Silk with Urology.  He recommends attempting to flush the current catheter and if unsuccessful could place a 20 [place]  two-way catheter for further irrigation. Discussed with patient who is in agreement with plan.   Discussed patient's case with Urology, Dr. Alyson Ingles to request admission. Patient and family (if present) updated with plan. Care transferred to Urology service.  I reviewed all nursing notes, vitals, pertinent old records, EKGs, labs, imaging (as available).  ____________________________________________  FINAL CLINICAL IMPRESSION(S) / ED DIAGNOSES  Final diagnoses:  Urinary retention  Gross hematuria     MEDICATIONS GIVEN DURING THIS VISIT:  Medications  sodium chloride irrigation 0.9 % 3,000 mL (0 mLs Irrigation Stopped 01/22/20 1710)  sodium chloride irrigation 0.9 % 3,000 mL ( Irrigation Restarted 01/23/20 0635)  0.9 %  sodium chloride infusion ( Intravenous Restarted 01/23/20 0635)  oxyCODONE-acetaminophen (PERCOCET/ROXICET) 5-325 MG per tablet 1-2 tablet (has no administration in time range)  HYDROmorphone (DILAUDID) injection 0.5-1 mg (has no administration in time range)  opium-belladonna (B&O) suppository 16.2-60mg  (has no administration in time range)  zolpidem (AMBIEN) tablet 5 mg (has no administration in time range)  diphenhydrAMINE (BENADRYL) injection 12.5 mg (has no administration in time range)    Or  diphenhydrAMINE (BENADRYL) 12.5 MG/5ML elixir 12.5 mg (has no administration in time range)  ondansetron (ZOFRAN) injection 4 mg (has no administration in time range)  amLODipine (NORVASC) tablet 10 mg (10 mg Oral Given 01/22/20 1518)  fluticasone (FLONASE) 50 MCG/ACT nasal spray 2 spray (2 sprays Each Nare Given 01/22/20 1757)  metoprolol succinate (TOPROL-XL) 24 hr tablet 100 mg (100 mg Oral Given 01/22/20 1756)  pravastatin (PRAVACHOL) tablet 80 mg (80 mg Oral Given 01/22/20 1756)  torsemide (DEMADEX) tablet 20 mg (20 mg Oral Given 01/22/20 1756)  ceFEPIme (MAXIPIME) 1 g in sodium chloride 0.9 % 100 mL IVPB (has no administration in time range)  morphine 4 MG/ML injection 4 mg  (4 mg Intravenous Given 01/22/20 0825)  ondansetron (ZOFRAN) injection 4 mg (4 mg Intravenous Given 01/22/20 0824)  morphine 4 MG/ML injection 4 mg (4 mg Intravenous Given 01/22/20 1000)  ceFEPIme (MAXIPIME) 2 g in sodium chloride 0.9 % 100 mL IVPB (0 g Intravenous Stopped 01/22/20 1547)    Note:  This document was prepared using Dragon voice recognition software and may include unintentional dictation errors.  Nanda Quinton, MD, [facility]    Janeth Terry, [place], MD 01/23/20 [phone]

## 2020-01-23 DIAGNOSIS — R310 Gross hematuria: Secondary | ICD-10-CM

## 2020-01-23 DIAGNOSIS — N3289 Other specified disorders of bladder: Secondary | ICD-10-CM

## 2020-01-23 DIAGNOSIS — R1030 Lower abdominal pain, unspecified: Secondary | ICD-10-CM

## 2020-01-23 LAB — URINE CULTURE: Culture: NO GROWTH

## 2020-01-23 MED ORDER — CEFDINIR 300 MG PO CAPS: 300.0000 mg | ORAL_CAPSULE | Freq: Every day | ORAL | 0 refills | Status: DC

## 2020-01-23 NOTE — Discharge Instructions (Signed)
Indwelling Urinary Catheter Care, Adult An indwelling urinary catheter is a thin tube that is put into your bladder. The tube helps to drain pee (urine) out of your body. The tube goes in through your urethra. Your urethra is where pee comes out of your body. Your pee will come out through the catheter, then it will go into a bag (drainage bag). Take good care of your catheter so it will work well. How to wear your catheter and bag Supplies needed  Sticky tape (adhesive tape) or a leg strap.  Alcohol wipe or soap and water (if you use tape).  A clean towel (if you use tape).  Large overnight bag.  Smaller bag (leg bag). Wearing your catheter Attach your catheter to your leg with tape or a leg strap.  Make sure the catheter is not pulled tight.  If a leg strap gets wet, take it off and put on a dry strap.  If you use tape to hold the bag on your leg: 1. Use an alcohol wipe or soap and water to wash your skin where the tape made it sticky before. 2. Use a clean towel to pat-dry that skin. 3. Use new tape to make the bag stay on your leg. Wearing your bags You should have been given a large overnight bag.  You may wear the overnight bag in the day or night.  Always have the overnight bag lower than your bladder.  Do not let the bag touch the floor.  Before you go to sleep, put a clean plastic bag in a wastebasket. Then hang the overnight bag inside the wastebasket. You should also have a smaller leg bag that fits under your clothes.  Always wear the leg bag below your knee.  Do not wear your leg bag at night. How to care for your skin and catheter Supplies needed  A clean washcloth.  Water and mild soap.  A clean towel. Caring for your skin and catheter      Clean the skin around your catheter every day: 1. Wash your hands with soap and water. 2. Wet a clean washcloth in warm water and mild soap. 3. Clean the skin around your urethra.  If you are  male:  Gently spread the folds of skin around your vagina (labia).  With the washcloth in your other hand, wipe the inner side of your labia on each side. Wipe from front to back.  If you are male:  Pull back any skin that covers the end of your penis (foreskin).  With the washcloth in your other hand, wipe your penis in small circles. Start wiping at the tip of your penis, then move away from the catheter.  Move the foreskin back in place, if needed. 4. With your free hand, hold the catheter close to where it goes into your body.  Keep holding the catheter during cleaning so it does not get pulled out. 5. With the washcloth in your other hand, clean the catheter.  Only wipe downward on the catheter.  Do not wipe upward toward your body. Doing this may push germs into your urethra and cause infection. 6. Use a clean towel to pat-dry the catheter and the skin around it. Make sure to wipe off all soap. 7. Wash your hands with soap and water.  Shower every day. Do not take baths.  Do not use cream, ointment, or lotion on the area where the catheter goes into your body, unless your doctor tells you   to.  Do not use powders, sprays, or lotions on your genital area.  Check your skin around the catheter every day for signs of infection. Check for: ? Redness, swelling, or pain. ? Fluid or blood. ? Warmth. ? Pus or a bad smell. How to empty the bag Supplies needed  Rubbing alcohol.  Gauze pad or cotton ball.  Tape or a leg strap. Emptying the bag Pour the pee out of your bag when it is ?- full, or at least 2-3 times a day. Do this for your overnight bag and your leg bag. 1. Wash your hands with soap and water. 2. Separate (detach) the bag from your leg. 3. Hold the bag over the toilet or a clean pail. Keep the bag lower than your hips and bladder. This is so the pee (urine) does not go back into the tube. 4. Open the pour spout. It is at the bottom of the bag. 5. Empty the  pee into the toilet or pail. Do not let the pour spout touch any surface. 6. Put rubbing alcohol on a gauze pad or cotton ball. 7. Use the gauze pad or cotton ball to clean the pour spout. 8. Close the pour spout. 9. Attach the bag to your leg with tape or a leg strap. 10. Wash your hands with soap and water. Follow instructions for cleaning the drainage bag:  From the product maker.  As told by your doctor. How to change the bag Supplies needed  Alcohol wipes.  A clean bag.  Tape or a leg strap. Changing the bag Replace your bag when it starts to leak, smell bad, or look dirty. 1. Wash your hands with soap and water. 2. Separate the dirty bag from your leg. 3. Pinch the catheter with your fingers so that pee does not spill out. 4. Separate the catheter tube from the bag tube where these tubes connect (at the connection valve). Do not let the tubes touch any surface. 5. Clean the end of the catheter tube with an alcohol wipe. Use a different alcohol wipe to clean the end of the bag tube. 6. Connect the catheter tube to the tube of the clean bag. 7. Attach the clean bag to your leg with tape or a leg strap. Do not make the bag tight on your leg. 8. Wash your hands with soap and water. General rules   Never pull on your catheter. Never try to take it out. Doing that can hurt you.  Always wash your hands before and after you touch your catheter or bag. Use a mild, fragrance-free soap. If you do not have soap and water, use hand sanitizer.  Always make sure there are no twists or bends (kinks) in the catheter tube.  Always make sure there are no leaks in the catheter or bag.  Drink enough fluid to keep your pee pale yellow.  Do not take baths, swim, or use a hot tub.  If you are male, wipe from front to back after you poop (have a bowel movement). Contact a doctor if:  Your pee is cloudy.  Your pee smells worse than usual.  Your catheter gets clogged.  Your catheter  leaks.  Your bladder feels full. Get help right away if:  You have redness, swelling, or pain where the catheter goes into your body.  You have fluid, blood, pus, or a bad smell coming from the area where the catheter goes into your body.  Your skin feels warm where   the catheter goes into your body.  You have a fever.  You have pain in your: ? Belly (abdomen). ? Legs. ? Lower back. ? Bladder.  You see blood in the catheter.  Your pee is pink or red.  You feel sick to your stomach (nauseous).  You throw up (vomit).  You have chills.  Your pee is not draining into the bag.  Your catheter gets pulled out. Summary  An indwelling urinary catheter is a thin tube that is placed into the bladder to help drain pee (urine) out of the body.  The catheter is placed into the part of the body that drains pee from the bladder (urethra).  Taking good care of your catheter will keep it working properly and help prevent problems.  Always wash your hands before and after touching your catheter or bag.  Never pull on your catheter or try to take it out.   Hold the daily aspirin for the next 2 weeks.   This information is not intended to replace advice given to you by your health care provider. Make sure you discuss any questions you have with your health care provider. Document Revised: 10/04/2018 Document Reviewed: 01/26/2017 Elsevier Patient Education  [place].

## 2020-01-23 NOTE — ED Notes (Signed)
Pt reports that he does not have a ride

## 2020-01-23 NOTE — Discharge Summary (Signed)
Physician Discharge Summary  Patient ID: Danny [name] MRN: [id number] DOB/AGE: 66-Nov-1957 66 y.o.  Admit date: 01/22/2020 Discharge date: 01/23/2020  Admission Diagnoses:  Clot retention of urine  Discharge Diagnoses:  Principal Problem:   Clot retention of urine Active Problems:   Acute on chronic anemia   UTI (urinary tract infection), bacterial   Past Medical History:  Diagnosis Date  . Anemia   . Arthritis   . CKD (chronic kidney disease), stage IV ([place])   . Diabetes mellitus without complication ([place])   . Foot ulcer ([place])   . Hypertension   . Urinary retention     Surgeries:  on * No surgery found *   Consultants (if any):   Discharged Condition: Improved  Hospital Course: Danny [name] is an 64 y.o. male who was admitted 01/22/2020 with a diagnosis of Clot retention of urine and had a large bore 3 way foley placed for irrigation of clot.  His urine is clear this morning.  His Hgb was down from baseline to 8.7 on admission and 8.9 at f/u but he is generally in the low 9's.   He will be discharged with the foley.  He was given perioperative antibiotics:  Anti-infectives (From admission, onward)   Start     Dose/Rate Route Frequency Ordered Stop   01/23/20 1400  ceFEPIme (MAXIPIME) 1 g in sodium chloride 0.9 % 100 mL IVPB     Discontinue     1 g 200 mL/hr over 30 Minutes Intravenous Every 24 hours 01/22/20 1436     01/23/20 0000  cefdinir (OMNICEF) 300 MG capsule     Discontinue     300 mg Oral Daily 01/23/20 0824     01/22/20 1445  ceFEPIme (MAXIPIME) 2 g in sodium chloride 0.9 % 100 mL IVPB        2 g 200 mL/hr over 30 Minutes Intravenous  Once 01/22/20 1436 01/22/20 1547   01/22/20 1400  ceFEPIme (MAXIPIME) 2 g in sodium chloride 0.9 % 100 mL IVPB  Status:  Discontinued        2 g 200 mL/hr over 30 Minutes Intravenous Every 12 hours 01/22/20 1354 01/22/20 1436    .   He benefited maximally from the hospital stay and there were no complications.    Recent vital  signs:  Vitals:   01/23/20 0710 01/23/20 0711  BP: (!) 124/95 (!) 124/95  Pulse: 59 61  Resp:  18  Temp:  98 F (36.7 C)  SpO2: 92% 93%    Recent laboratory studies:  Lab Results  Component Value Date   HGB 8.9 (L) 01/22/2020   HGB 8.7 (L) 01/16/2020   HGB 9.8 (L) 01/13/2020   Lab Results  Component Value Date   WBC 10.0 01/22/2020   PLT 168 01/22/2020   No results found for: INR Lab Results  Component Value Date   NA 134 (L) 01/22/2020   K 4.6 01/22/2020   CL 104 01/22/2020   CO2 21 (L) 01/22/2020   BUN 49 (H) 01/22/2020   CREATININE 3.77 (H) 01/22/2020   GLUCOSE 396 (H) 01/22/2020    Discharge Medications:   Allergies as of 01/23/2020      Reactions   Dust Mite Extract Itching, Other (See Comments)   Unknown reaction-potential shortness of breath   Prednisone Nausea And Vomiting   Rocephin [ceftriaxone] Nausea And Vomiting      Medication List    STOP taking these medications   amoxicillin-clavulanate 875-125 MG tablet Commonly known  as: AUGMENTIN     TAKE these medications   Accu-Chek SmartView test strip Generic drug: glucose blood 1 each by Other route as needed.   acetaminophen 500 MG tablet Commonly known as: TYLENOL Take 1,000 mg by mouth every 6 (six) hours as needed for moderate pain or headache.   ALLERGY EYE DROPS OP Place 1 drop into both eyes daily as needed (allergies/dry eyes).   amLODipine 5 MG tablet Commonly known as: NORVASC Take 1 tablet (5 mg total) by mouth at bedtime.   amLODipine 10 MG tablet Commonly known as: NORVASC Take 10 mg by mouth daily.   aspirin 325 MG tablet Take 325 mg by mouth at bedtime.   cefdinir 300 MG capsule Commonly known as: OMNICEF Take 1 capsule (300 mg total) by mouth daily.   cholecalciferol 25 MCG (1000 UNIT) tablet Commonly known as: VITAMIN D3 Take 1,000 Units by mouth daily.   Darbepoetin Alfa 150 MCG/0.3ML Sosy injection Commonly known as: ARANESP Inject 0.3 mLs (150 mcg total)  into the skin every Saturday at 6 PM. What changed: when to take this   Droplet Insulin Syringe 31G X 5/16" 1 ML Misc Generic drug: Insulin Syringe-Needle U-100   FISH OIL PO Take 2,400 mg by mouth 2 (two) times daily.   fluticasone 50 MCG/ACT nasal spray Commonly known as: FLONASE Place 2 sprays into both nostrils daily as needed for allergies.   folic acid 1 MG tablet Commonly known as: FOLVITE Take 1 tablet (1 mg total) by mouth daily.   insulin aspart protamine- aspart (70-30) 100 UNIT/ML injection Commonly known as: NOVOLOG MIX 70/30 Inject 0.08 mLs (8 Units total) into the skin 3 (three) times daily.   Magnesium 100 MG Caps Take 100 mg by mouth daily.   metoprolol succinate 100 MG 24 hr tablet Commonly known as: TOPROL-XL Take 1 tablet (100 mg total) by mouth every evening.   mirabegron ER 25 MG Tb24 tablet Commonly known as: MYRBETRIQ Take 1 tablet (25 mg total) by mouth daily.   multivitamin with minerals Tabs tablet Take 1 tablet by mouth daily.   NovoLIN 70/30 ReliOn (70-30) 100 UNIT/ML injection Generic drug: insulin NPH-regular Human Inject 20-100 Units into the skin in the morning and at bedtime.   pravastatin 80 MG tablet Commonly known as: PRAVACHOL Take 1 tablet (80 mg total) by mouth every evening.   Retacrit 4000 UNIT/ML injection Generic drug: epoetin alfa-epbx Inject 4,000 Units into the skin every 14 (fourteen) days.   torsemide 20 MG tablet Commonly known as: DEMADEX Take 2 tablets (40 mg total) by mouth 2 (two) times daily. What changed: how much to take   traMADol 50 MG tablet Commonly known as: Ultram Take 1 tablet (50 mg total) by mouth every 6 (six) hours as needed.   vitamin B-12 1000 MCG tablet Commonly known as: CYANOCOBALAMIN Take 1,000 mcg by mouth daily.   Vitamin C 500 MG Chew Chew 1,000 mg by mouth daily.       Diagnostic Studies: CT Renal Stone Study  Result Date: 01/22/2020 CLINICAL DATA:  Flank pain. EXAM: CT  ABDOMEN AND PELVIS WITHOUT CONTRAST TECHNIQUE: Multidetector CT imaging of the abdomen and pelvis was performed following the standard protocol without IV contrast. COMPARISON:  February 28, 2019 FINDINGS: Lower chest: Very mild atelectasis is seen within the bilateral lung bases. Small bilateral pleural effusions are seen, right greater than left. Hepatobiliary: No focal liver abnormality is seen. Status post cholecystectomy. No biliary dilatation. Pancreas: Unremarkable. No pancreatic ductal  dilatation or surrounding inflammatory changes. Spleen: Normal in size without focal abnormality. Adrenals/Urinary Tract: The right adrenal gland is normal in appearance. A 3.4 cm x 3.1 cm low-attenuation left adrenal mass is seen. Kidneys are normal in size. A 2.7 cm exophytic cyst is seen along the anterolateral aspect of the mid right kidney. A subcentimeter left renal cyst is noted. A stable, approximately 1.0 cm ill-defined mildly hyperdense focus is seen within the anteromedial aspect of the mid left kidney (axial CT image 40, CT series number 2). 2 mm and 3 mm nonobstructing renal stones are seen within the right kidney. Additional 2 mm nonobstructing renal stones are seen within the lower pole of the left kidney. A moderate to marked amount of heterogeneous hyperdense material is seen within the dependent portion of a moderately distended urinary bladder. A mild amount of intraluminal air is seen as well as the distal tip and insufflator bulb of a Foley catheter. A mild amount of inflammatory fat stranding is seen anterior to the urinary bladder. No urinary bladder wall thickening is noted. Stomach/Bowel: Stomach is within normal limits. The appendix is not clearly identified. No evidence of bowel wall thickening, distention, or inflammatory changes. Vascular/Lymphatic: There is mild to moderate severity calcification of the abdominal aorta and bilateral common iliac arteries. No enlarged abdominal or pelvic lymph  nodes. Reproductive: There is very mild enlargement of the prostate gland. Other: A 2.7 cm x 1.8 cm fat containing right inguinal hernia is seen. An additional 4.5 cm x 4.1 cm fat containing left inguinal hernia is noted. No free fluid is seen. Musculoskeletal: Mild subcutaneous inflammatory fat stranding is seen along the midline of the anterior pelvic wall. Moderate severity multilevel degenerative changes are noted throughout the lumbar spine with vacuum disc phenomenon seen at the levels of L2-L3 and L3-L4. IMPRESSION: 1. Moderate to marked amount of heterogeneous hyperdense material within the dependent portion of a moderately distended urinary bladder. While this is suspicious for hemorrhagic material, the presence of an underlying neoplastic process cannot be excluded. Correlation with cystoscopy is recommended. 2. Bilateral nonobstructing renal stones. 3. Stable low-attenuation left adrenal mass which may represent an adrenal adenoma. 4. Small bilateral pleural effusions, right greater than left. 5. Bilateral fat containing inguinal hernias. 6. Mild to moderate severity calcification of the abdominal aorta and bilateral common iliac arteries. Aortic Atherosclerosis (ICD10-I70.0). Electronically Signed   By: Virgina Norfolk M.D.   On: 01/22/2020 09:40    Disposition: Discharge disposition: 01-Home or [facility] Follow up.   Specialty: Urology Why: Please call for a f/u appointment for next week for catheter removal.  Contact information: [place] [phone]               Signed: Irine Seal 01/23/2020, 8:30 AM

## 2020-01-27 ENCOUNTER — Encounter (HOSPITAL_COMMUNITY)
Admission: RE | Admit: 2020-01-27 | Discharge: 2020-01-27 | Disposition: A | Discharge status: Home / Self Care | Payer: Medicare HMO | Source: Ambulatory Visit | Attending: Nephrology | Admitting: Nephrology

## 2020-01-27 ENCOUNTER — Encounter (HOSPITAL_COMMUNITY): Payer: Self-pay

## 2020-01-27 ENCOUNTER — Other Ambulatory Visit: Payer: Self-pay

## 2020-01-27 DIAGNOSIS — D631 Anemia in chronic kidney disease: Secondary | ICD-10-CM | POA: Diagnosis not present

## 2020-01-27 DIAGNOSIS — N184 Chronic kidney disease, stage 4 (severe): Secondary | ICD-10-CM | POA: Diagnosis not present

## 2020-01-27 LAB — POCT HEMOGLOBIN-HEMACUE: Hemoglobin: 7.2 g/dL — ABNORMAL LOW (ref 13.0–17.0)

## 2020-01-27 MED ORDER — EPOETIN ALFA-EPBX 4000 UNIT/ML IJ SOLN
4000.0000 [IU] | Freq: Once | INTRAMUSCULAR | Status: AC
  Administered 2020-01-27: 4000 [IU] via SUBCUTANEOUS
  Filled 2020-01-27: qty 1

## 2020-02-03 ENCOUNTER — Ambulatory Visit (INDEPENDENT_AMBULATORY_CARE_PROVIDER_SITE_OTHER): Payer: Medicare HMO | Admitting: Urology

## 2020-02-03 ENCOUNTER — Other Ambulatory Visit: Payer: Self-pay

## 2020-02-03 VITALS — BP 148/63 | HR 73 | Temp 98.0°F | Ht 73.0 in | Wt 285.0 lb

## 2020-02-03 DIAGNOSIS — R339 Retention of urine, unspecified: Secondary | ICD-10-CM

## 2020-02-03 NOTE — Progress Notes (Signed)
Urological Symptom Review  Patient is experiencing the following symptoms: none   Review of Systems  Gastrointestinal (upper)  : Negative for upper GI symptoms  Gastrointestinal (lower) : Negative for lower GI symptoms  Constitutional : Negative for symptoms  Skin: Negative for skin symptoms  Eyes: Blurred vision  Ear/Nose/Throat : Negative for Ear/Nose/Throat symptoms  Hematologic/Lymphatic: Negative for Hematologic/Lymphatic symptoms  Cardiovascular : Leg swelling  Respiratory : Negative for respiratory symptoms  Endocrine: Negative for endocrine symptoms  Musculoskeletal: Joint pain  Neurological: Negative for neurological symptoms  Psychologic: Negative for psychiatric symptoms

## 2020-02-05 ENCOUNTER — Other Ambulatory Visit: Payer: Self-pay

## 2020-02-05 ENCOUNTER — Ambulatory Visit (INDEPENDENT_AMBULATORY_CARE_PROVIDER_SITE_OTHER): Payer: Medicare HMO

## 2020-02-05 DIAGNOSIS — R339 Retention of urine, unspecified: Secondary | ICD-10-CM

## 2020-02-05 NOTE — Progress Notes (Signed)
Fill and Pull Catheter Removal  Patient is present today for a catheter removal.  Patient was cleaned and prepped in a sterile fashion 57ml of sterile water/ saline was instilled into the bladder when the patient bladder began to spasm. Dr. Alyson Ingles made aware.  Orders given to remove catheter. 1ml of water was then drained from the balloon.  A 24FR foley cath was removed from the bladder no complications were noted .  Patient was then given some time to void on their own.  Patient began to involuntarily void  From penis. Pt informed to return this afternoon for PVR.Marland Kitchen  Patient tolerated well.  Performed by: Danny Coffield, LPN     Pt returned this afternoon stating that he has not voided since leaving office this morning inspite of drinking plenty of fluids. PVR shows pt was empty. Pt asked if his brief was wet and he stated yes along with his pants. Pt informed that he is voiding and is incontinent. Pt brief noted to be heavily soiled in office.   Follow up/ Additional notes: 1 month with PVR

## 2020-02-06 ENCOUNTER — Telehealth: Payer: Self-pay | Admitting: Urology

## 2020-02-06 NOTE — Telephone Encounter (Signed)
Pt called and asked for a nurse to call him regarding urinary issue.

## 2020-02-06 NOTE — Telephone Encounter (Signed)
Pt called today with complaints of incontinence. Pt was seen in the office yesterday for a voiding trial post TURP. Completed voiding trial without difficulty. Pt since the removal of the catheter he is frequently incontinent of urine and his brief is staying wet. Instructed pt to do timed voiding every 2 hours to help with the incontinence. Pt voiced understanding.   Pt also requested a condom cath to help with incontinence. Order request sent to [employer] to supply pt with condom cath during this period of incontinence.

## 2020-02-10 ENCOUNTER — Encounter (HOSPITAL_COMMUNITY): Payer: Self-pay

## 2020-02-10 ENCOUNTER — Other Ambulatory Visit: Payer: Self-pay

## 2020-02-10 ENCOUNTER — Encounter (HOSPITAL_COMMUNITY)
Admission: RE | Admit: 2020-02-10 | Discharge: 2020-02-10 | Disposition: A | Discharge status: Home / Self Care | Payer: Medicare HMO | Source: Ambulatory Visit | Attending: Nephrology | Admitting: Nephrology

## 2020-02-10 DIAGNOSIS — N184 Chronic kidney disease, stage 4 (severe): Secondary | ICD-10-CM | POA: Diagnosis not present

## 2020-02-10 DIAGNOSIS — D631 Anemia in chronic kidney disease: Secondary | ICD-10-CM | POA: Diagnosis not present

## 2020-02-10 LAB — CBC
HCT: 25.8 % — ABNORMAL LOW (ref 39.0–52.0)
Hemoglobin: 8.2 g/dL — ABNORMAL LOW (ref 13.0–17.0)
MCH: 27.8 pg (ref 26.0–34.0)
MCHC: 31.8 g/dL (ref 30.0–36.0)
MCV: 87.5 fL (ref 80.0–100.0)
Platelets: 245 10*3/uL (ref 150–400)
RBC: 2.95 MIL/uL — ABNORMAL LOW (ref 4.22–5.81)
RDW: 16.4 % — ABNORMAL HIGH (ref 11.5–15.5)
WBC: 8.5 10*3/uL (ref 4.0–10.5)
nRBC: 0 % (ref 0.0–0.2)

## 2020-02-10 LAB — RENAL FUNCTION PANEL
Albumin: 3 g/dL — ABNORMAL LOW (ref 3.5–5.0)
Anion gap: 11 (ref 5–15)
BUN: 47 mg/dL — ABNORMAL HIGH (ref 8–23)
CO2: 22 mmol/L (ref 22–32)
Calcium: 8.7 mg/dL — ABNORMAL LOW (ref 8.9–10.3)
Chloride: 106 mmol/L (ref 98–111)
Creatinine, Ser: 3.86 mg/dL — ABNORMAL HIGH (ref 0.61–1.24)
GFR calc Af Amer: 18 mL/min — ABNORMAL LOW (ref >60)
GFR calc non Af Amer: 15 mL/min — ABNORMAL LOW (ref >60)
Glucose, Bld: 115 mg/dL — ABNORMAL HIGH (ref 70–99)
Phosphorus: 4.3 mg/dL (ref 2.5–4.6)
Potassium: 4 mmol/L (ref 3.5–5.1)
Sodium: 139 mmol/L (ref 135–145)

## 2020-02-10 LAB — POCT HEMOGLOBIN-HEMACUE: Hemoglobin: 8.1 g/dL — ABNORMAL LOW (ref 13.0–17.0)

## 2020-02-10 MED ORDER — EPOETIN ALFA-EPBX 4000 UNIT/ML IJ SOLN
INTRAMUSCULAR | Status: AC
  Filled 2020-02-10: qty 1

## 2020-02-10 MED ORDER — EPOETIN ALFA-EPBX 4000 UNIT/ML IJ SOLN
4000.0000 [IU] | Freq: Once | INTRAMUSCULAR | Status: AC
  Administered 2020-02-10: 4000 [IU] via SUBCUTANEOUS

## 2020-02-11 ENCOUNTER — Ambulatory Visit: Payer: Medicare HMO | Admitting: Urology

## 2020-02-17 NOTE — Progress Notes (Signed)
Appointment rescheduled.

## 2020-02-19 DIAGNOSIS — N189 Chronic kidney disease, unspecified: Secondary | ICD-10-CM | POA: Diagnosis not present

## 2020-02-19 DIAGNOSIS — R809 Proteinuria, unspecified: Secondary | ICD-10-CM | POA: Diagnosis not present

## 2020-02-19 DIAGNOSIS — E1129 Type 2 diabetes mellitus with other diabetic kidney complication: Secondary | ICD-10-CM | POA: Diagnosis not present

## 2020-02-19 DIAGNOSIS — D631 Anemia in chronic kidney disease: Secondary | ICD-10-CM | POA: Diagnosis not present

## 2020-02-19 DIAGNOSIS — E1122 Type 2 diabetes mellitus with diabetic chronic kidney disease: Secondary | ICD-10-CM | POA: Diagnosis not present

## 2020-02-19 DIAGNOSIS — I5032 Chronic diastolic (congestive) heart failure: Secondary | ICD-10-CM | POA: Diagnosis not present

## 2020-02-19 DIAGNOSIS — I129 Hypertensive chronic kidney disease with stage 1 through stage 4 chronic kidney disease, or unspecified chronic kidney disease: Secondary | ICD-10-CM | POA: Diagnosis not present

## 2020-02-20 ENCOUNTER — Telehealth: Payer: Self-pay

## 2020-02-20 NOTE — Telephone Encounter (Signed)
Pt called asking if can resume his 81mg  ASA Per Dr. Alyson Ingles pt is able to resume 81mg  ASA. Pt called and notified.   Pt also states he still is having issues with incontinence post cath removal and surgery. Kegel exercise mailed to pt.

## 2020-02-23 ENCOUNTER — Encounter (HOSPITAL_COMMUNITY): Payer: Self-pay | Admitting: Urology

## 2020-02-24 ENCOUNTER — Encounter (HOSPITAL_COMMUNITY)
Admission: RE | Admit: 2020-02-24 | Discharge: 2020-02-24 | Disposition: A | Discharge status: Home / Self Care | Payer: Medicare HMO | Source: Ambulatory Visit | Attending: Nephrology | Admitting: Nephrology

## 2020-02-24 ENCOUNTER — Other Ambulatory Visit: Payer: Self-pay

## 2020-02-24 ENCOUNTER — Encounter (HOSPITAL_COMMUNITY): Payer: Self-pay

## 2020-02-24 DIAGNOSIS — N184 Chronic kidney disease, stage 4 (severe): Secondary | ICD-10-CM | POA: Diagnosis not present

## 2020-02-24 DIAGNOSIS — D631 Anemia in chronic kidney disease: Secondary | ICD-10-CM | POA: Diagnosis not present

## 2020-02-24 LAB — POCT HEMOGLOBIN-HEMACUE: Hemoglobin: 8.8 g/dL — ABNORMAL LOW (ref 13.0–17.0)

## 2020-02-24 MED ORDER — EPOETIN ALFA-EPBX 10000 UNIT/ML IJ SOLN
4000.0000 [IU] | Freq: Once | INTRAMUSCULAR | Status: AC
  Administered 2020-02-24: 4000 [IU] via SUBCUTANEOUS

## 2020-02-24 MED ORDER — EPOETIN ALFA-EPBX 2000 UNIT/ML IJ SOLN
INTRAMUSCULAR | Status: AC
  Filled 2020-02-24: qty 2

## 2020-02-26 ENCOUNTER — Other Ambulatory Visit: Payer: Self-pay | Admitting: *Deleted

## 2020-02-26 DIAGNOSIS — N184 Chronic kidney disease, stage 4 (severe): Secondary | ICD-10-CM

## 2020-03-05 ENCOUNTER — Ambulatory Visit: Payer: Medicare HMO | Admitting: Urology

## 2020-03-09 ENCOUNTER — Other Ambulatory Visit: Payer: Self-pay

## 2020-03-09 ENCOUNTER — Encounter (HOSPITAL_COMMUNITY): Payer: Self-pay

## 2020-03-09 ENCOUNTER — Encounter (HOSPITAL_COMMUNITY)
Admission: RE | Admit: 2020-03-09 | Discharge: 2020-03-09 | Disposition: A | Discharge status: Home / Self Care | Payer: Medicare HMO | Source: Ambulatory Visit | Attending: Nephrology | Admitting: Nephrology

## 2020-03-09 DIAGNOSIS — N185 Chronic kidney disease, stage 5: Secondary | ICD-10-CM | POA: Diagnosis not present

## 2020-03-09 DIAGNOSIS — D631 Anemia in chronic kidney disease: Secondary | ICD-10-CM | POA: Insufficient documentation

## 2020-03-09 LAB — POCT HEMOGLOBIN-HEMACUE: Hemoglobin: 8.6 g/dL — ABNORMAL LOW (ref 13.0–17.0)

## 2020-03-09 MED ORDER — EPOETIN ALFA-EPBX 10000 UNIT/ML IJ SOLN: 4000.0000 [IU] | Freq: Once | INTRAMUSCULAR | Status: DC

## 2020-03-09 MED ORDER — EPOETIN ALFA-EPBX 2000 UNIT/ML IJ SOLN
2000.0000 [IU] | Freq: Once | INTRAMUSCULAR | Status: AC
  Administered 2020-03-09: 2000 [IU] via SUBCUTANEOUS

## 2020-03-09 MED ORDER — EPOETIN ALFA-EPBX 2000 UNIT/ML IJ SOLN
INTRAMUSCULAR | Status: AC
  Filled 2020-03-09: qty 2

## 2020-03-10 ENCOUNTER — Encounter: Payer: Self-pay | Admitting: Urology

## 2020-03-10 ENCOUNTER — Ambulatory Visit (INDEPENDENT_AMBULATORY_CARE_PROVIDER_SITE_OTHER): Payer: Medicare HMO | Admitting: Urology

## 2020-03-10 ENCOUNTER — Telehealth: Payer: Self-pay

## 2020-03-10 VITALS — BP 178/79 | HR 71

## 2020-03-10 DIAGNOSIS — N138 Other obstructive and reflux uropathy: Secondary | ICD-10-CM

## 2020-03-10 DIAGNOSIS — R339 Retention of urine, unspecified: Secondary | ICD-10-CM

## 2020-03-10 DIAGNOSIS — N401 Enlarged prostate with lower urinary tract symptoms: Secondary | ICD-10-CM | POA: Diagnosis not present

## 2020-03-10 LAB — URINALYSIS, ROUTINE W REFLEX MICROSCOPIC
Bilirubin, UA: NEGATIVE
Ketones, UA: NEGATIVE
Nitrite, UA: NEGATIVE
Specific Gravity, UA: 1.02 (ref 1.005–1.030)
Urobilinogen, Ur: 0.2 mg/dL (ref 0.2–1.0)
pH, UA: 7 (ref 5.0–7.5)

## 2020-03-10 LAB — MICROSCOPIC EXAMINATION
Bacteria, UA: NONE SEEN
Renal Epithel, UA: NONE SEEN /hpf

## 2020-03-10 NOTE — Patient Instructions (Signed)

## 2020-03-10 NOTE — Progress Notes (Signed)
03/10/2020 11:32 AM   Danny [name] Apr 10, 1956 [id number]  Referring provider: Celene Squibb, MD [address],  [place] [address] Followup BPH  HPI: Danny [name] is a [phone] here for followup for BPH with Urinary retention. PVR 15cc. His stream is strong. No nocturia. His torsemide is now 3x per day which increased his urinary frequency. No dysuria or hematuria   PMH: Past Medical History:  Diagnosis Date  . Anemia   . Arthritis   . CKD (chronic kidney disease), stage IV ([place])   . Diabetes mellitus without complication ([place])   . Foot ulcer ([place])   . Hypertension   . Urinary retention     Surgical History: Past Surgical History:  Procedure Laterality Date  . ANKLE SURGERY Right   . CHOLECYSTECTOMY    . FOOT SURGERY Right   . TRANSURETHRAL RESECTION OF PROSTATE N/A 01/15/2020   Procedure: TRANSURETHRAL RESECTION OF THE PROSTATE (TURP)  with General anesthesia and spinal;  Surgeon: Cleon Gustin, MD;  Location: [facility];  Service: Urology;  Laterality: N/A;    Home Medications:  Allergies as of 03/10/2020      Reactions   Dust Mite Extract Itching, Other (See Comments)   Unknown reaction-potential shortness of breath   Prednisone Nausea And Vomiting   Rocephin [ceftriaxone] Nausea And Vomiting      Medication List       Accurate as of March 10, 2020 11:32 AM. If you have any questions, ask your nurse or doctor.        Accu-Chek SmartView test strip Generic drug: glucose blood 1 each by Other route as needed.   acetaminophen 500 MG tablet Commonly known as: TYLENOL Take 1,000 mg by mouth every 6 (six) hours as needed for moderate pain or headache.   ALLERGY EYE DROPS OP Place 1 drop into both eyes daily as needed (allergies/dry eyes).   amLODipine 10 MG tablet Commonly known as: NORVASC Take 10 mg by mouth daily.   aspirin 325 MG tablet Take 325 mg by mouth at bedtime.   cefdinir 300 MG capsule Commonly known as: OMNICEF Take 1 capsule  (300 mg total) by mouth daily.   cholecalciferol 25 MCG (1000 UNIT) tablet Commonly known as: VITAMIN D3 Take 1,000 Units by mouth daily.   Darbepoetin Alfa 150 MCG/0.3ML Sosy injection Commonly known as: ARANESP Inject 0.3 mLs (150 mcg total) into the skin every Saturday at 6 PM. What changed: when to take this   Droplet Insulin Syringe 31G X 5/16" 1 ML Misc Generic drug: Insulin Syringe-Needle U-100   FISH OIL PO Take 2,400 mg by mouth 2 (two) times daily.   fluticasone 50 MCG/ACT nasal spray Commonly known as: FLONASE Place 2 sprays into both nostrils daily as needed for allergies.   Magnesium 100 MG Caps Take 100 mg by mouth daily.   metoprolol succinate 100 MG 24 hr tablet Commonly known as: TOPROL-XL Take 1 tablet (100 mg total) by mouth every evening.   mirabegron ER 25 MG Tb24 tablet Commonly known as: MYRBETRIQ Take 1 tablet (25 mg total) by mouth daily.   multivitamin with minerals Tabs tablet Take 1 tablet by mouth daily.   NovoLIN 70/30 ReliOn (70-30) 100 UNIT/ML injection Generic drug: insulin NPH-regular Human Inject 20-100 Units into the skin in the morning and at bedtime.   pravastatin 80 MG tablet Commonly known as: PRAVACHOL Take 1 tablet (80 mg total) by mouth every evening.   Retacrit 4000 UNIT/ML injection Generic  drug: epoetin alfa-epbx 4,000 Units every 14 (fourteen) days.   torsemide 20 MG tablet Commonly known as: DEMADEX Take by mouth. What changed: Another medication with the same name was removed. Continue taking this medication, and follow the directions you see here.   traMADol 50 MG tablet Commonly known as: Ultram Take 1 tablet (50 mg total) by mouth every 6 (six) hours as needed.   vitamin B-12 1000 MCG tablet Commonly known as: CYANOCOBALAMIN Take 1,000 mcg by mouth daily.   Vitamin C 500 MG Chew Chew 1,000 mg by mouth daily.       Allergies:  Allergies  Allergen Reactions  . Dust Mite Extract Itching and Other  (See Comments)    Unknown reaction-potential shortness of breath  . Prednisone Nausea And Vomiting  . Rocephin [Ceftriaxone] Nausea And Vomiting    Family History: No family history on file.  Social History:  reports that he has never smoked. He has never used smokeless tobacco. He reports that he does not drink alcohol and does not use drugs.  ROS: All other review of systems were reviewed and are negative except what is noted above in HPI  Physical Exam: BP (!) 178/79   Pulse 71   Constitutional:  Alert and oriented, No acute distress. HEENT: Trail AT, moist mucus membranes.  Trachea midline, no masses. Cardiovascular: No clubbing, cyanosis, or edema. Respiratory: Normal respiratory effort, no increased work of breathing. GI: Abdomen is soft, nontender, nondistended, no abdominal masses GU: No CVA tenderness.  Lymph: No cervical or inguinal lymphadenopathy. Skin: No rashes, bruises or suspicious lesions. Neurologic: Grossly intact, no focal deficits, moving all 4 extremities. Psychiatric: Normal mood and affect.  Laboratory Data: Lab Results  Component Value Date   WBC 8.5 02/10/2020   HGB 8.6 (L) 03/09/2020   HCT 25.8 (L) 02/10/2020   MCV 87.5 02/10/2020   PLT 245 02/10/2020    Lab Results  Component Value Date   CREATININE 3.86 (H) 02/10/2020    No results found for: PSA  No results found for: TESTOSTERONE  Lab Results  Component Value Date   HGBA1C 7.2 (H) 01/13/2020    Urinalysis    Component Value Date/Time   COLORURINE RED (A) 01/22/2020 0852   APPEARANCEUR TURBID (A) 01/22/2020 0852   LABSPEC  01/22/2020 0852    TEST NOT REPORTED DUE TO COLOR INTERFERENCE OF URINE PIGMENT   PHURINE  01/22/2020 0852    TEST NOT REPORTED DUE TO COLOR INTERFERENCE OF URINE PIGMENT   GLUCOSEU (A) 01/22/2020 0852    TEST NOT REPORTED DUE TO COLOR INTERFERENCE OF URINE PIGMENT   HGBUR (A) 01/22/2020 0852    TEST NOT REPORTED DUE TO COLOR INTERFERENCE OF URINE PIGMENT    BILIRUBINUR (A) 01/22/2020 0852    TEST NOT REPORTED DUE TO COLOR INTERFERENCE OF URINE PIGMENT   KETONESUR (A) 01/22/2020 0852    TEST NOT REPORTED DUE TO COLOR INTERFERENCE OF URINE PIGMENT   PROTEINUR (A) 01/22/2020 0852    TEST NOT REPORTED DUE TO COLOR INTERFERENCE OF URINE PIGMENT   NITRITE (A) 01/22/2020 0852    TEST NOT REPORTED DUE TO COLOR INTERFERENCE OF URINE PIGMENT   LEUKOCYTESUR (A) 01/22/2020 0852    TEST NOT REPORTED DUE TO COLOR INTERFERENCE OF URINE PIGMENT    Lab Results  Component Value Date   BACTERIA RARE (A) 01/22/2020    Pertinent Imaging: No results found for this or any previous visit.  No results found for this or any previous visit.  No results found for this or any previous visit.  No results found for this or any previous visit.  Results for orders placed during the hospital encounter of 07/07/19  US RENAL  Narrative CLINICAL DATA:  Acute kidney injury.  EXAM: RENAL / URINARY TRACT ULTRASOUND COMPLETE  COMPARISON:  Renal ultrasound 02/24/2019  FINDINGS: Right Kidney:  Renal measurements: 10.9 x 5.4 x 5.8 cm = volume: 182 mL. No hydronephrosis. Mildly increased renal cortical echogenicity. Redemonstrated exophytic simple appearing interpolar cyst measuring 2.4 x 2.4 x 2.5 cm.  Left Kidney:  Renal measurements: 11.8 x 6.1 x 4.9 cm = volume: 184 mL. No hydronephrosis. Mildly increased renal cortical echogenicity.  Bladder:  The bladder is collapsed around a Foley catheter.  Other:  Small volume pelvic ascites.  IMPRESSION: Increased renal cortical echogenicity bilaterally, suggestive of chronic renal parenchymal disease.  2.5 cm simple appearing right renal cyst.  No hydronephrosis.  Bladder collapsed around Foley catheter.  Small volume pelvic ascites.   Electronically Signed By: Kellie Simmering DO On: 07/08/2019 09:25  No results found for this or any previous visit.  No results found for this or any previous  visit.  Results for orders placed during the hospital encounter of 01/22/20  CT Renal Stone Study  Narrative CLINICAL DATA:  Flank pain.  EXAM: CT ABDOMEN AND PELVIS WITHOUT CONTRAST  TECHNIQUE: Multidetector CT imaging of the abdomen and pelvis was performed following the standard protocol without IV contrast.  COMPARISON:  February 28, 2019  FINDINGS: Lower chest: Very mild atelectasis is seen within the bilateral lung bases.  Small bilateral pleural effusions are seen, right greater than left.  Hepatobiliary: No focal liver abnormality is seen. Status post cholecystectomy. No biliary dilatation.  Pancreas: Unremarkable. No pancreatic ductal dilatation or surrounding inflammatory changes.  Spleen: Normal in size without focal abnormality.  Adrenals/Urinary Tract: The right adrenal gland is normal in appearance. A 3.4 cm x 3.1 cm low-attenuation left adrenal mass is seen. Kidneys are normal in size. A 2.7 cm exophytic cyst is seen along the anterolateral aspect of the mid right kidney. A subcentimeter left renal cyst is noted. A stable, approximately 1.0 cm ill-defined mildly hyperdense focus is seen within the anteromedial aspect of the mid left kidney (axial CT image 40, CT series number 2). 2 mm and 3 mm nonobstructing renal stones are seen within the right kidney. Additional 2 mm nonobstructing renal stones are seen within the lower pole of the left kidney.  A moderate to marked amount of heterogeneous hyperdense material is seen within the dependent portion of a moderately distended urinary bladder. A mild amount of intraluminal air is seen as well as the distal tip and insufflator bulb of a Foley catheter. A mild amount of inflammatory fat stranding is seen anterior to the urinary bladder. No urinary bladder wall thickening is noted.  Stomach/Bowel: Stomach is within normal limits. The appendix is not clearly identified. No evidence of bowel wall  thickening, distention, or inflammatory changes.  Vascular/Lymphatic: There is mild to moderate severity calcification of the abdominal aorta and bilateral common iliac arteries. No enlarged abdominal or pelvic lymph nodes.  Reproductive: There is very mild enlargement of the prostate gland.  Other: A 2.7 cm x 1.8 cm fat containing right inguinal hernia is seen. An additional 4.5 cm x 4.1 cm fat containing left inguinal hernia is noted.  No free fluid is seen.  Musculoskeletal: Mild subcutaneous inflammatory fat stranding is seen along the midline of the anterior pelvic wall.  Moderate severity multilevel degenerative changes are noted throughout the lumbar spine with vacuum disc phenomenon seen at the levels of L2-L3 and L3-L4.  IMPRESSION: 1. Moderate to marked amount of heterogeneous hyperdense material within the dependent portion of a moderately distended urinary bladder. While this is suspicious for hemorrhagic material, the presence of an underlying neoplastic process cannot be excluded. Correlation with cystoscopy is recommended. 2. Bilateral nonobstructing renal stones. 3. Stable low-attenuation left adrenal mass which may represent an adrenal adenoma. 4. Small bilateral pleural effusions, right greater than left. 5. Bilateral fat containing inguinal hernias. 6. Mild to moderate severity calcification of the abdominal aorta and bilateral common iliac arteries.  Aortic Atherosclerosis (ICD10-I70.0).   Electronically Signed By: Virgina Norfolk M.D. On: 01/22/2020 09:40   Assessment & Plan:    1. Urinary retention -resolved  2. Benign prostatic hyperplasia with urinary obstruction RTC 3 months with PVR   No follow-ups on file.  Nicolette Bang, MD  [facility] [place]

## 2020-03-10 NOTE — Progress Notes (Signed)
Pt here today for bladder scan. Bladder was scanned and 15 was visualized.     Urological Symptom Review  Patient is experiencing the following symptoms: Frequent urination Leakage of urine   Review of Systems  Gastrointestinal (upper)  : Negative for upper GI symptoms  Gastrointestinal (lower) : Negative for lower GI symptoms  Constitutional : Negative for symptoms  Skin: Negative for skin symptoms  Eyes: Negative for eye symptoms  Ear/Nose/Throat : Negative for Ear/Nose/Throat symptoms  Hematologic/Lymphatic: Negative for Hematologic/Lymphatic symptoms  Cardiovascular : Negative for cardiovascular symptoms  Respiratory : Negative for respiratory symptoms  Endocrine: Negative for endocrine symptoms  Musculoskeletal: Negative for musculoskeletal symptoms  Neurological: Negative for neurological symptoms  Psychologic: Negative for psychiatric symptoms

## 2020-03-10 NOTE — Addendum Note (Signed)
Addended by: Valentina Lucks on: 03/10/2020 11:52 AM   Modules accepted: Orders

## 2020-03-10 NOTE — Telephone Encounter (Signed)
Patient called saying his Kidney Dr. Referred him to see Dr. Donnetta Hutching in [place].  He is still recovering from Prostate surgery and is feeling very week.  He will try to see Dr. Donnetta Hutching in a month or so when he recovers.   Thurston Hole., LPN

## 2020-03-23 ENCOUNTER — Other Ambulatory Visit: Payer: Self-pay

## 2020-03-23 ENCOUNTER — Encounter (HOSPITAL_COMMUNITY): Payer: Self-pay

## 2020-03-23 ENCOUNTER — Other Ambulatory Visit (HOSPITAL_COMMUNITY): Payer: Medicare HMO

## 2020-03-23 ENCOUNTER — Emergency Department (HOSPITAL_COMMUNITY): Payer: Medicare HMO

## 2020-03-23 ENCOUNTER — Encounter (HOSPITAL_COMMUNITY)
Admission: RE | Admit: 2020-03-23 | Discharge: 2020-03-23 | Disposition: A | Discharge status: Home / Self Care | Payer: Medicare HMO | Source: Ambulatory Visit | Attending: Nephrology | Admitting: Nephrology

## 2020-03-23 ENCOUNTER — Inpatient Hospital Stay (HOSPITAL_COMMUNITY)
Admission: EM | Admit: 2020-03-23 | Discharge: 2020-03-31 | DRG: 291 | Disposition: A | Discharge status: Skilled Nursing Facility | Payer: Medicare HMO | Attending: Internal Medicine | Admitting: Internal Medicine

## 2020-03-23 DIAGNOSIS — Z20822 Contact with and (suspected) exposure to covid-19: Secondary | ICD-10-CM | POA: Diagnosis not present

## 2020-03-23 DIAGNOSIS — Z8249 Family history of ischemic heart disease and other diseases of the circulatory system: Secondary | ICD-10-CM

## 2020-03-23 DIAGNOSIS — I517 Cardiomegaly: Secondary | ICD-10-CM | POA: Diagnosis not present

## 2020-03-23 DIAGNOSIS — I5032 Chronic diastolic (congestive) heart failure: Secondary | ICD-10-CM | POA: Diagnosis present

## 2020-03-23 DIAGNOSIS — Z833 Family history of diabetes mellitus: Secondary | ICD-10-CM | POA: Diagnosis not present

## 2020-03-23 DIAGNOSIS — R338 Other retention of urine: Secondary | ICD-10-CM | POA: Diagnosis present

## 2020-03-23 DIAGNOSIS — R0602 Shortness of breath: Secondary | ICD-10-CM | POA: Diagnosis not present

## 2020-03-23 DIAGNOSIS — N401 Enlarged prostate with lower urinary tract symptoms: Secondary | ICD-10-CM | POA: Diagnosis present

## 2020-03-23 DIAGNOSIS — R069 Unspecified abnormalities of breathing: Secondary | ICD-10-CM | POA: Diagnosis not present

## 2020-03-23 DIAGNOSIS — R339 Retention of urine, unspecified: Secondary | ICD-10-CM | POA: Diagnosis not present

## 2020-03-23 DIAGNOSIS — D631 Anemia in chronic kidney disease: Secondary | ICD-10-CM | POA: Diagnosis present

## 2020-03-23 DIAGNOSIS — N390 Urinary tract infection, site not specified: Secondary | ICD-10-CM | POA: Diagnosis present

## 2020-03-23 DIAGNOSIS — N184 Chronic kidney disease, stage 4 (severe): Secondary | ICD-10-CM | POA: Diagnosis present

## 2020-03-23 DIAGNOSIS — Z888 Allergy status to other drugs, medicaments and biological substances status: Secondary | ICD-10-CM

## 2020-03-23 DIAGNOSIS — R109 Unspecified abdominal pain: Secondary | ICD-10-CM | POA: Diagnosis not present

## 2020-03-23 DIAGNOSIS — J90 Pleural effusion, not elsewhere classified: Secondary | ICD-10-CM | POA: Diagnosis not present

## 2020-03-23 DIAGNOSIS — E1122 Type 2 diabetes mellitus with diabetic chronic kidney disease: Secondary | ICD-10-CM | POA: Diagnosis not present

## 2020-03-23 DIAGNOSIS — R0902 Hypoxemia: Secondary | ICD-10-CM

## 2020-03-23 DIAGNOSIS — Z6838 Body mass index (BMI) 38.0-38.9, adult: Secondary | ICD-10-CM | POA: Diagnosis not present

## 2020-03-23 DIAGNOSIS — I5033 Acute on chronic diastolic (congestive) heart failure: Secondary | ICD-10-CM

## 2020-03-23 DIAGNOSIS — I130 Hypertensive heart and chronic kidney disease with heart failure and stage 1 through stage 4 chronic kidney disease, or unspecified chronic kidney disease: Secondary | ICD-10-CM | POA: Diagnosis not present

## 2020-03-23 DIAGNOSIS — L89514 Pressure ulcer of right ankle, stage 4: Secondary | ICD-10-CM | POA: Diagnosis not present

## 2020-03-23 DIAGNOSIS — J9601 Acute respiratory failure with hypoxia: Secondary | ICD-10-CM | POA: Diagnosis not present

## 2020-03-23 DIAGNOSIS — Z23 Encounter for immunization: Secondary | ICD-10-CM

## 2020-03-23 DIAGNOSIS — M6281 Muscle weakness (generalized): Secondary | ICD-10-CM | POA: Diagnosis not present

## 2020-03-23 DIAGNOSIS — N189 Chronic kidney disease, unspecified: Secondary | ICD-10-CM | POA: Diagnosis present

## 2020-03-23 DIAGNOSIS — N138 Other obstructive and reflux uropathy: Secondary | ICD-10-CM | POA: Diagnosis not present

## 2020-03-23 DIAGNOSIS — Z7401 Bed confinement status: Secondary | ICD-10-CM | POA: Diagnosis not present

## 2020-03-23 DIAGNOSIS — I10 Essential (primary) hypertension: Secondary | ICD-10-CM

## 2020-03-23 DIAGNOSIS — R1084 Generalized abdominal pain: Secondary | ICD-10-CM | POA: Diagnosis not present

## 2020-03-23 DIAGNOSIS — E1169 Type 2 diabetes mellitus with other specified complication: Secondary | ICD-10-CM | POA: Diagnosis not present

## 2020-03-23 DIAGNOSIS — E1121 Type 2 diabetes mellitus with diabetic nephropathy: Secondary | ICD-10-CM | POA: Diagnosis present

## 2020-03-23 DIAGNOSIS — Z881 Allergy status to other antibiotic agents status: Secondary | ICD-10-CM | POA: Diagnosis not present

## 2020-03-23 DIAGNOSIS — Z794 Long term (current) use of insulin: Secondary | ICD-10-CM

## 2020-03-23 DIAGNOSIS — Z7982 Long term (current) use of aspirin: Secondary | ICD-10-CM

## 2020-03-23 DIAGNOSIS — D649 Anemia, unspecified: Secondary | ICD-10-CM | POA: Diagnosis not present

## 2020-03-23 DIAGNOSIS — L8990 Pressure ulcer of unspecified site, unspecified stage: Secondary | ICD-10-CM | POA: Diagnosis present

## 2020-03-23 DIAGNOSIS — R52 Pain, unspecified: Secondary | ICD-10-CM | POA: Diagnosis not present

## 2020-03-23 DIAGNOSIS — E669 Obesity, unspecified: Secondary | ICD-10-CM | POA: Diagnosis not present

## 2020-03-23 DIAGNOSIS — Z79899 Other long term (current) drug therapy: Secondary | ICD-10-CM

## 2020-03-23 DIAGNOSIS — E1165 Type 2 diabetes mellitus with hyperglycemia: Secondary | ICD-10-CM | POA: Diagnosis not present

## 2020-03-23 DIAGNOSIS — E1140 Type 2 diabetes mellitus with diabetic neuropathy, unspecified: Secondary | ICD-10-CM | POA: Diagnosis not present

## 2020-03-23 DIAGNOSIS — J9690 Respiratory failure, unspecified, unspecified whether with hypoxia or hypercapnia: Secondary | ICD-10-CM | POA: Diagnosis present

## 2020-03-23 DIAGNOSIS — E11649 Type 2 diabetes mellitus with hypoglycemia without coma: Secondary | ICD-10-CM | POA: Diagnosis present

## 2020-03-23 DIAGNOSIS — E785 Hyperlipidemia, unspecified: Secondary | ICD-10-CM | POA: Diagnosis not present

## 2020-03-23 DIAGNOSIS — E119 Type 2 diabetes mellitus without complications: Secondary | ICD-10-CM

## 2020-03-23 DIAGNOSIS — G8929 Other chronic pain: Secondary | ICD-10-CM | POA: Diagnosis present

## 2020-03-23 DIAGNOSIS — R600 Localized edema: Secondary | ICD-10-CM

## 2020-03-23 LAB — COMPREHENSIVE METABOLIC PANEL
ALT: 43 U/L (ref 0–44)
AST: 22 U/L (ref 15–41)
Albumin: 3.3 g/dL — ABNORMAL LOW (ref 3.5–5.0)
Alkaline Phosphatase: 113 U/L (ref 38–126)
Anion gap: 8 (ref 5–15)
BUN: 62 mg/dL — ABNORMAL HIGH (ref 8–23)
CO2: 23 mmol/L (ref 22–32)
Calcium: 8.8 mg/dL — ABNORMAL LOW (ref 8.9–10.3)
Chloride: 106 mmol/L (ref 98–111)
Creatinine, Ser: 3.91 mg/dL — ABNORMAL HIGH (ref 0.61–1.24)
GFR calc Af Amer: 17 mL/min — ABNORMAL LOW (ref >60)
GFR calc non Af Amer: 15 mL/min — ABNORMAL LOW (ref >60)
Glucose, Bld: 275 mg/dL — ABNORMAL HIGH (ref 70–99)
Potassium: 4.7 mmol/L (ref 3.5–5.1)
Sodium: 137 mmol/L (ref 135–145)
Total Bilirubin: 1.5 mg/dL — ABNORMAL HIGH (ref 0.3–1.2)
Total Protein: 7.4 g/dL (ref 6.5–8.1)

## 2020-03-23 LAB — CBC WITH DIFFERENTIAL/PLATELET
Abs Immature Granulocytes: 0.05 10*3/uL (ref 0.00–0.07)
Basophils Absolute: 0 10*3/uL (ref 0.0–0.1)
Basophils Relative: 0 %
Eosinophils Absolute: 0.2 10*3/uL (ref 0.0–0.5)
Eosinophils Relative: 2 %
HCT: 27.4 % — ABNORMAL LOW (ref 39.0–52.0)
Hemoglobin: 8.5 g/dL — ABNORMAL LOW (ref 13.0–17.0)
Immature Granulocytes: 1 %
Lymphocytes Relative: 8 %
Lymphs Abs: 0.7 10*3/uL (ref 0.7–4.0)
MCH: 27.8 pg (ref 26.0–34.0)
MCHC: 31 g/dL (ref 30.0–36.0)
MCV: 89.5 fL (ref 80.0–100.0)
Monocytes Absolute: 0.4 10*3/uL (ref 0.1–1.0)
Monocytes Relative: 5 %
Neutro Abs: 7.6 10*3/uL (ref 1.7–7.7)
Neutrophils Relative %: 84 %
Platelets: 194 10*3/uL (ref 150–400)
RBC: 3.06 MIL/uL — ABNORMAL LOW (ref 4.22–5.81)
RDW: 18.6 % — ABNORMAL HIGH (ref 11.5–15.5)
WBC: 9 10*3/uL (ref 4.0–10.5)
nRBC: 0 % (ref 0.0–0.2)

## 2020-03-23 LAB — URINALYSIS, ROUTINE W REFLEX MICROSCOPIC
Bacteria, UA: NONE SEEN
Bilirubin Urine: NEGATIVE
Glucose, UA: >=500 mg/dL — AB
Ketones, ur: NEGATIVE mg/dL
Nitrite: NEGATIVE
Protein, ur: >=300 mg/dL — AB
RBC / HPF: >50 RBC/hpf — ABNORMAL HIGH (ref 0–5)
Specific Gravity, Urine: 1.01 (ref 1.005–1.030)
pH: 5 (ref 5.0–8.0)

## 2020-03-23 LAB — RESPIRATORY PANEL BY RT PCR (FLU A&B, COVID)
Influenza A by PCR: NEGATIVE
Influenza B by PCR: NEGATIVE
SARS Coronavirus 2 by RT PCR: NEGATIVE

## 2020-03-23 LAB — BRAIN NATRIURETIC PEPTIDE: B Natriuretic Peptide: 351 pg/mL — ABNORMAL HIGH (ref 0.0–100.0)

## 2020-03-23 MED ORDER — PRAVASTATIN SODIUM 40 MG PO TABS
80.0000 mg | ORAL_TABLET | Freq: Every evening | ORAL | Status: DC
  Administered 2020-03-24 – 2020-03-30 (×7): 80 mg via ORAL
  Filled 2020-03-23 (×2): qty 2
  Filled 2020-03-23 (×2): qty 1
  Filled 2020-03-23 (×2): qty 2
  Filled 2020-03-23: qty 1
  Filled 2020-03-23 (×3): qty 2

## 2020-03-23 MED ORDER — VITAMIN B-12 1000 MCG PO TABS
1000.0000 ug | ORAL_TABLET | Freq: Every day | ORAL | Status: DC
  Administered 2020-03-24 – 2020-03-31 (×8): 1000 ug via ORAL
  Filled 2020-03-23 (×8): qty 1

## 2020-03-23 MED ORDER — ALBUTEROL SULFATE (2.5 MG/3ML) 0.083% IN NEBU: 2.5000 mg | INHALATION_SOLUTION | Freq: Four times a day (QID) | RESPIRATORY_TRACT | Status: DC | PRN

## 2020-03-23 MED ORDER — TRAMADOL HCL 50 MG PO TABS
50.0000 mg | ORAL_TABLET | Freq: Four times a day (QID) | ORAL | Status: DC | PRN
  Administered 2020-03-24 – 2020-03-26 (×2): 50 mg via ORAL
  Filled 2020-03-23 (×3): qty 1

## 2020-03-23 MED ORDER — MAGNESIUM 100 MG PO CAPS: 100.0000 mg | ORAL_CAPSULE | Freq: Every day | ORAL | Status: DC

## 2020-03-23 MED ORDER — FUROSEMIDE 10 MG/ML IJ SOLN
20.0000 mg | Freq: Once | INTRAMUSCULAR | Status: AC
  Administered 2020-03-23: 20 mg via INTRAVENOUS
  Filled 2020-03-23: qty 2

## 2020-03-23 MED ORDER — FLUTICASONE PROPIONATE 50 MCG/ACT NA SUSP
2.0000 | Freq: Every day | NASAL | Status: DC | PRN
  Administered 2020-03-24: 2 via NASAL
  Filled 2020-03-23 (×2): qty 16

## 2020-03-23 MED ORDER — HYDRALAZINE HCL 20 MG/ML IJ SOLN: 5.0000 mg | INTRAMUSCULAR | Status: DC | PRN

## 2020-03-23 MED ORDER — CIPROFLOXACIN IN D5W 400 MG/200ML IV SOLN
400.0000 mg | INTRAVENOUS | Status: DC
  Administered 2020-03-23 – 2020-03-24 (×2): 400 mg via INTRAVENOUS
  Filled 2020-03-23 (×2): qty 200

## 2020-03-23 MED ORDER — HEPARIN SODIUM (PORCINE) 5000 UNIT/ML IJ SOLN
5000.0000 [IU] | Freq: Three times a day (TID) | INTRAMUSCULAR | Status: DC
  Administered 2020-03-23 – 2020-03-31 (×19): 5000 [IU] via SUBCUTANEOUS
  Filled 2020-03-23 (×20): qty 1

## 2020-03-23 MED ORDER — FUROSEMIDE 10 MG/ML IJ SOLN
40.0000 mg | Freq: Two times a day (BID) | INTRAMUSCULAR | Status: DC
  Administered 2020-03-23 – 2020-03-25 (×4): 40 mg via INTRAVENOUS
  Filled 2020-03-23 (×4): qty 4

## 2020-03-23 MED ORDER — INSULIN ASPART 100 UNIT/ML ~~LOC~~ SOLN
0.0000 [IU] | Freq: Three times a day (TID) | SUBCUTANEOUS | Status: DC
  Administered 2020-03-24: 3 [IU] via SUBCUTANEOUS
  Administered 2020-03-24: 2 [IU] via SUBCUTANEOUS
  Administered 2020-03-24 – 2020-03-25 (×2): 3 [IU] via SUBCUTANEOUS
  Administered 2020-03-25 (×2): 2 [IU] via SUBCUTANEOUS
  Administered 2020-03-26: 3 [IU] via SUBCUTANEOUS
  Administered 2020-03-26: 2 [IU] via SUBCUTANEOUS
  Administered 2020-03-26: 3 [IU] via SUBCUTANEOUS
  Administered 2020-03-27: 2 [IU] via SUBCUTANEOUS
  Administered 2020-03-27: 5 [IU] via SUBCUTANEOUS
  Administered 2020-03-27 – 2020-03-28 (×2): 3 [IU] via SUBCUTANEOUS
  Administered 2020-03-28: 2 [IU] via SUBCUTANEOUS
  Administered 2020-03-28: 3 [IU] via SUBCUTANEOUS
  Administered 2020-03-29 (×2): 2 [IU] via SUBCUTANEOUS
  Administered 2020-03-30: 3 [IU] via SUBCUTANEOUS
  Administered 2020-03-30 – 2020-03-31 (×3): 1 [IU] via SUBCUTANEOUS
  Administered 2020-03-31: 2 [IU] via SUBCUTANEOUS
  Filled 2020-03-23 (×3): qty 1

## 2020-03-23 MED ORDER — METOPROLOL SUCCINATE ER 50 MG PO TB24
100.0000 mg | ORAL_TABLET | Freq: Every evening | ORAL | Status: DC
  Administered 2020-03-23 – 2020-03-30 (×7): 100 mg via ORAL
  Filled 2020-03-23 (×3): qty 2
  Filled 2020-03-23: qty 4
  Filled 2020-03-23 (×2): qty 2
  Filled 2020-03-23: qty 4
  Filled 2020-03-23: qty 2

## 2020-03-23 MED ORDER — AMLODIPINE BESYLATE 5 MG PO TABS
10.0000 mg | ORAL_TABLET | Freq: Every day | ORAL | Status: DC
  Administered 2020-03-23 – 2020-03-25 (×3): 10 mg via ORAL
  Filled 2020-03-23 (×3): qty 2

## 2020-03-23 MED ORDER — KETOTIFEN FUMARATE 0.025 % OP SOLN
1.0000 [drp] | Freq: Every day | OPHTHALMIC | Status: DC | PRN
  Filled 2020-03-23: qty 5

## 2020-03-23 NOTE — H&P (Signed)
History and Physical    Danny [name] QHU:[id number] DOB: 09-21-1955 DOA: 03/23/2020  PCP: Celene Squibb, MD Patient coming from: home  Chief Complaint: lower abd pain and SOB.   HPI: Danny [name] is a 64 y.o. male with medical history significant of CKD IV, DM, HTN, Urinary retention, BPH s/p TURP, anemia of chronic disease.   Pt complaining of intermittetn back and lower abd pain for 2 wks. Increasing in intensity and frequency. Urine has turned red and brown for the past week. Deniea dysuria, frequency, fever, myalgia. Since TURP in July pt has not needed to self catheterize. Over the past day pt endorses decreased UOP (150cc total on day of admission). Increasing LE swelling over this period of time as well. Pt also reporting intermittent cough, nasal congestion, rinorrhea, and sore throat and SOB, DOE,  that started 3 wks ago. No COVID vaccine. Denies, loss of taste or smell. Pt does endorse missing some doses of diuretic recewntly  ED Course: Pt hjypoxic on admissoin to ED to 78% on RA. No  Home O2 requirement.   Review of Systems: As per HPI otherwise all other systems reviewed and are negative  Ambulatory Status:no restrictions  Past Medical History:  Diagnosis Date  . Anemia   . Arthritis   . CKD (chronic kidney disease), stage IV ([place])   . Diabetes mellitus without complication ([place])   . Foot ulcer ([place])   . Hypertension   . Urinary retention     Past Surgical History:  Procedure Laterality Date  . ANKLE SURGERY Right   . CHOLECYSTECTOMY    . FOOT SURGERY Right   . TRANSURETHRAL RESECTION OF PROSTATE N/A 01/15/2020   Procedure: TRANSURETHRAL RESECTION OF THE PROSTATE (TURP)  with General anesthesia and spinal;  Surgeon: Cleon Gustin, MD;  Location: [facility];  Service: Urology;  Laterality: N/A;    Social History   Socioeconomic History  . Marital status: Divorced    Spouse name: Not on file  . Number of children: Not on file  . Years of education: Not on file  .  Highest education level: Not on file  Occupational History  . Not on file  Tobacco Use  . Smoking status: Never Smoker  . Smokeless tobacco: Never Used  Vaping Use  . Vaping Use: Never used  Substance and Sexual Activity  . Alcohol use: No  . Drug use: No  . Sexual activity: Not Currently  Other Topics Concern  . Not on file  Social History Narrative  . Not on file   Social Determinants of Health   Financial Resource Strain:   . Difficulty of Paying Living Expenses: Not on file  Food Insecurity:   . Worried About Charity fundraiser in the Last Year: Not on file  . Ran Out of Food in the Last Year: Not on file  Transportation Needs:   . Lack of Transportation (Medical): Not on file  . Lack of Transportation (Non-Medical): Not on file  Physical Activity:   . Days of Exercise per Week: Not on file  . Minutes of Exercise per Session: Not on file  Stress:   . Feeling of Stress : Not on file  Social Connections:   . Frequency of Communication with Friends and Family: Not on file  . Frequency of Social Gatherings with Friends and Family: Not on file  . Attends Religious Services: Not on file  . Active Member of Clubs or Organizations: Not on file  . Attends Club  or Organization Meetings: Not on file  . Marital Status: Not on file  Intimate Partner Violence:   . Fear of Current or Ex-Partner: Not on file  . Emotionally Abused: Not on file  . Physically Abused: Not on file  . Sexually Abused: Not on file    Allergies  Allergen Reactions  . Dust Mite Extract Itching and Other (See Comments)    Unknown reaction-potential shortness of breath  . Prednisone Nausea And Vomiting  . Rocephin [Ceftriaxone] Nausea And Vomiting    Family History  Problem Relation Age of Onset  . Diabetes Mother   . Heart attack Mother   . Heart attack Father   . Diabetes Brother       Prior to Admission medications   Medication Sig Start Date End Date Taking? Authorizing Provider   ACCU-CHEK SMARTVIEW test strip 1 each by Other route as needed.  05/15/19   [provider]  acetaminophen (TYLENOL) 500 MG tablet Take 1,000 mg by mouth every 6 (six) hours as needed for moderate pain or headache.    [provider]  amLODipine (NORVASC) 10 MG tablet Take 10 mg by mouth daily.  10/14/19   [provider]  Ascorbic Acid (VITAMIN C) 500 MG CHEW Chew 1,000 mg by mouth daily.    [provider]  aspirin 325 MG tablet Take 325 mg by mouth at bedtime.     [provider]  cefdinir (OMNICEF) 300 MG capsule Take 1 capsule (300 mg total) by mouth daily. 01/23/20   Irine Seal, MD  cholecalciferol (VITAMIN D3) 25 MCG (1000 UNIT) tablet Take 1,000 Units by mouth daily.    [provider]  Darbepoetin Alfa (ARANESP) 150 MCG/0.3ML SOSY injection Inject 0.3 mLs (150 mcg total) into the skin every Saturday at 6 PM. Patient taking differently: Inject 150 mcg into the skin every 14 (fourteen) days.  07/19/19   Barton Dubois, MD  DROPLET INSULIN SYRINGE 31G X 5/16" 1 ML [place]  10/29/19   [provider]  epoetin alfa-epbx (RETACRIT) 4000 UNIT/ML injection 4,000 Units every 14 (fourteen) days.    Bhutani, Manpreet S, MD  fluticasone (FLONASE) 50 MCG/ACT nasal spray Place 2 sprays into both nostrils daily as needed for allergies.  02/11/19   [provider]  Ketotifen Fumarate (ALLERGY EYE DROPS OP) Place 1 drop into both eyes daily as needed (allergies/dry eyes).    [provider]  Magnesium 100 MG CAPS Take 100 mg by mouth daily.     [provider]  metoprolol succinate (TOPROL-XL) 100 MG 24 hr tablet Take 1 tablet (100 mg total) by mouth every evening. 03/20/19   Gerlene Fee, NP  mirabegron ER (MYRBETRIQ) 25 MG TB24 tablet Take 1 tablet (25 mg total) by mouth daily. 07/02/19   [name], [name], MD  Multiple Vitamin (MULTIVITAMIN WITH MINERALS) TABS tablet Take 1 tablet by mouth daily.    [provider]  NOVOLIN 70/30 RELION (70-30) 100 UNIT/ML injection Inject 20-100 Units into the skin in the morning and at bedtime.  08/13/19   [provider]  Omega-3 Fatty Acids (FISH OIL PO) Take 2,400 mg by mouth 2 (two) times daily.    [provider]  pravastatin (PRAVACHOL) 80 MG tablet Take 1 tablet (80 mg total) by mouth every evening. 03/20/19   Gerlene Fee, NP  torsemide (DEMADEX) 20 MG tablet Take by mouth. 02/19/20 04/19/20  [provider]  traMADol (ULTRAM) 50 MG tablet Take 1  tablet (50 mg total) by mouth every 6 (six) hours as needed. 01/16/20 01/15/21  Cleon Gustin, MD  vitamin B-12 (CYANOCOBALAMIN) 1000 MCG tablet Take 1,000 mcg by mouth daily.    [provider]    Physical Exam: Vitals:   03/23/20 1800 03/23/20 1830 03/23/20 1846 03/23/20 1846  BP: (!) 172/80 (!) 168/85    Pulse:  66 (!) 58   Resp: 11 15 19    Temp:    (!) 97.4 F (36.3 C)  TempSrc:    Oral  SpO2:  91% 97%   Weight:      Height:         General: Mildly distressed. Resting in bed.  Eyes: PERRL, EOMI, normal lids, iris ENT:  grossly normal hearing, lips & tongue, dry mm Neck:  no LAD, masses or thyromegaly Cardiovascular: faint heart sounds. 2-3+ LE edema.  RRR,  Respiratory: diminished breath sounds in bases. Increased effort on 3L Abdomen:  soft, ntnd, NABS Skin:  no rash or induration seen on limited exam Musculoskeletal:  grossly normal tone BUE/BLE, good ROM, no bony abnormality Psychiatric:  grossly normal mood and affect, speech fluent and appropriate, AOx3 Neurologic:  CN 2-12 grossly intact, moves all extremities in coordinated fashion, sensation intact  Labs on Admission: I have personally reviewed following labs and imaging studies  CBC: Recent Labs  Lab 03/23/20 1205  WBC 9.0  NEUTROABS 7.6  HGB 8.5*  HCT 27.4*  MCV 89.5  PLT 938   Basic Metabolic Panel: Recent Labs  Lab 03/23/20 1205  NA 137  K 4.7  CL 106  CO2 23   GLUCOSE 275*  BUN 62*  CREATININE 3.91*  CALCIUM 8.8*   GFR: Estimated Creatinine Clearance: 26.4 mL/min (A) (by C-G formula based on SCr of 3.91 mg/dL (H)). Liver Function Tests: Recent Labs  Lab 03/23/20 1205  AST 22  ALT 43  ALKPHOS 113  BILITOT 1.5*  PROT 7.4  ALBUMIN 3.3*   No results for input(s): LIPASE, AMYLASE in the last 168 hours. No results for input(s): AMMONIA in the last 168 hours. Coagulation Profile: No results for input(s): INR, PROTIME in the last 168 hours. Cardiac Enzymes: No results for input(s): CKTOTAL, CKMB, CKMBINDEX, TROPONINI in the last 168 hours. BNP (last 3 results) No results for input(s): PROBNP in the last 8760 hours. HbA1C: No results for input(s): HGBA1C in the last 72 hours. CBG: No results for input(s): GLUCAP in the last 168 hours. Lipid Profile: No results for input(s): CHOL, HDL, LDLCALC, TRIG, CHOLHDL, LDLDIRECT in the last 72 hours. Thyroid Function Tests: No results for input(s): TSH, T4TOTAL, FREET4, T3FREE, THYROIDAB in the last 72 hours. Anemia Panel: No results for input(s): VITAMINB12, FOLATE, FERRITIN, TIBC, IRON, RETICCTPCT in the last 72 hours. Urine analysis:    Component Value Date/Time   COLORURINE YELLOW 03/23/2020 1622   APPEARANCEUR CLEAR 03/23/2020 1622   APPEARANCEUR Clear 03/10/2020 1152   LABSPEC 1.010 03/23/2020 1622   PHURINE 5.0 03/23/2020 1622   GLUCOSEU >=500 (A) 03/23/2020 1622   HGBUR LARGE (A) 03/23/2020 1622   BILIRUBINUR NEGATIVE 03/23/2020 1622   BILIRUBINUR Negative 03/10/2020 1152   KETONESUR NEGATIVE 03/23/2020 1622   PROTEINUR >=300 (A) 03/23/2020 1622   NITRITE NEGATIVE 03/23/2020 1622   LEUKOCYTESUR TRACE (A) 03/23/2020 1622    Creatinine Clearance: Estimated Creatinine Clearance: 26.4 mL/min (A) (by C-G formula based on SCr of 3.91 mg/dL (H)).  Sepsis Labs: @LABRCNTIP (procalcitonin:4,lacticidven:4) ) Recent Results (from the past 240 hour(s))  Respiratory Panel  by RT PCR (Flu  A&B, Covid) - Nasopharyngeal Swab     Status: None   Collection Time: 03/23/20  2:25 PM   Specimen: Nasopharyngeal Swab  Result Value Ref Range Status   SARS Coronavirus 2 by RT PCR NEGATIVE NEGATIVE Final    Comment: (NOTE) SARS-CoV-2 target nucleic acids are NOT DETECTED.  The SARS-CoV-2 RNA is generally detectable in upper respiratoy specimens during the acute phase of infection. The lowest concentration of SARS-CoV-2 viral copies this assay can detect is 131 copies/mL. A negative result does not preclude SARS-Cov-2 infection and should not be used as the sole basis for treatment or other patient management decisions. A negative result may occur with  improper specimen collection/handling, submission of specimen other than nasopharyngeal swab, presence of viral mutation(s) within the areas targeted by this assay, and inadequate number of viral copies (<131 copies/mL). A negative result must be combined with clinical observations, patient history, and epidemiological information. The expected result is Negative.  Fact Sheet for Patients:  PinkCheek.be  Fact Sheet for Healthcare Providers:  GravelBags.it  This test is no t yet approved or cleared by the [place] FDA and  has been authorized for detection and/or diagnosis of SARS-CoV-2 by FDA under an Emergency Use Authorization (EUA). This EUA will remain  in effect (meaning this test can be used) for the duration of the COVID-19 declaration under Section 564(b)(1) of the Act, 21 U.S.C. section 360bbb-3(b)(1), unless the authorization is terminated or revoked sooner.     Influenza A by PCR NEGATIVE NEGATIVE Final   Influenza B by PCR NEGATIVE NEGATIVE Final    Comment: (NOTE) The Xpert Xpress SARS-CoV-2/FLU/RSV assay is intended as an aid in  the diagnosis of influenza from Nasopharyngeal swab specimens and  should not be used as a sole basis for treatment. Nasal  washings and  aspirates are unacceptable for Xpert Xpress SARS-CoV-2/FLU/RSV  testing.  Fact Sheet for Patients: PinkCheek.be  Fact Sheet for Healthcare Providers: GravelBags.it  This test is not yet approved or cleared by the [place] FDA and  has been authorized for detection and/or diagnosis of SARS-CoV-2 by  FDA under an Emergency Use Authorization (EUA). This EUA will remain  in effect (meaning this test can be used) for the duration of the  Covid-19 declaration under Section 564(b)(1) of the Act, 21  U.S.C. section 360bbb-3(b)(1), unless the authorization is  terminated or revoked. Performed at [facility], [address], [place], [place] [address]      Radiological Exams on Admission: CT Chest Wo Contrast  Result Date: 03/23/2020 CLINICAL DATA:  Respiratory failure. Shortness of breath. Lower abdominal pain. Symptoms for 3 weeks. EXAM: CT CHEST WITHOUT CONTRAST TECHNIQUE: Multidetector CT imaging of the chest was performed following the standard protocol without IV contrast. COMPARISON:  Plain film earlier today. No prior chest CT. A CT of the abdomen of 01/22/2020 is reviewed. FINDINGS: Cardiovascular: Aortic and branch vessel atherosclerosis. Mild cardiomegaly, without pericardial effusion. Multivessel coronary artery atherosclerosis. Pulmonary artery enlargement, outflow tract 3.9 cm Mediastinum/Nodes: Multiple small middle mediastinal nodes. None pathologic by size criteria. Hilar regions poorly evaluated without intravenous contrast. Lungs/Pleura: Small, right larger than left pleural effusions. Minimal motion degradation. Right lower lobe dependent atelectasis. Areas of minimal posterior right upper lobe dependent atelectasis. Mild patchy ground-glass opacity with areas of mild smooth septal thickening. Scattered calcified and noncalcified pulmonary nodules. Noncalcified nodules including within the right upper lobe  at 3 mm on 57/4, 3 mm on 61/4. Upper Abdomen: Moderate  to marked cirrhosis. Cholecystectomy. Normal imaged portions of the spleen, stomach, pancreas, right adrenal gland, kidneys. 2.9 cm low-density left adrenal nodule, technically indeterminate based on density measurements today, but on the order of 7-9 HU on 01/22/2020, most consistent with an adenoma. Musculoskeletal: Cervical and thoracic spondylosis. IMPRESSION: 1. Constellation of findings, including bilateral pleural effusions, smooth septal thickening, and scattered mild ground-glass, which are most consistent with fluid overload, likely secondary to congestive heart failure. 2. Tiny pulmonary nodules as detailed above. No follow-up needed if patient is low-risk. Non-contrast chest CT can be considered in 12 months if patient is high-risk. This recommendation follows the consensus statement: Guidelines for Management of Incidental Pulmonary Nodules Detected on CT Images: From the Fleischner Society 2017; Radiology 2017; 284:228-243. 3. Cirrhosis. 4. Left adrenal adenoma. 5. Coronary artery atherosclerosis. Aortic Atherosclerosis (ICD10-I70.0). 6. Pulmonary artery enlargement suggests pulmonary arterial hypertension. Electronically Signed   By: Abigail Miyamoto M.D.   On: 03/23/2020 17:49   DG Chest Port 1 View  Result Date: 03/23/2020 CLINICAL DATA:  Shortness of breath.  Abdominal pain. EXAM: PORTABLE CHEST 1 VIEW COMPARISON:  07/09/2019. FINDINGS: Stable cardiomegaly. Bilateral pulmonary infiltrates/edema, improved from prior exam. Small bilateral pleural effusions again noted. No pneumothorax. IMPRESSION: Stable cardiomegaly. Bilateral pulmonary infiltrates/edema, improved from prior exam. Small bilateral pleural effusions again noted. Electronically Signed   By: Marcello Moores  Register   On: 03/23/2020 11:14    EKG: Independently reviewed. Brady, No ACS.   Assessment/Plan Active Problems:   Essential hypertension   Diabetes mellitus (HCC)   CKD  (chronic kidney disease)   Acute on chronic diastolic (congestive) heart failure (HCC)   Acute respiratory failure with hypoxia (HCC)   Dyslipidemia associated with type 2 diabetes mellitus (HCC)   Edema of both lower extremities   Benign prostatic hyperplasia with urinary obstruction   Respiratory failure (HCC)   Acute Respiratory failure: Likely combination of decompensated dCHF and possible recent viral pulmonary process (onset 3 wks ago). Acitve respiratory negtive for covid, RSV, influenza. Denies COVID vaccine or prior infection. - Treatment of CHF fluid overload as below - COVID Nucleocapsid AB (latent effects of COVID may be at play even if current/active infeciton has passed) - unable to find test at time of admission.  - Albuterol PRN - Wean O2 as able  Acute on Chronic dCHF: Echo from 01/2019 w/ EF 73% and diastolic dysfunction. Pt has missed doses of diuretic recently. Feeling SOB and DOE. - IV Lasix 80 BID - Strict I/O  Hematuria/Retention: Ancitipate infeciton - PT states this feels exactly like his other infections. Unlikley to have gross hematuria in conjunction w/ abd and back pain. Pt w/ 450 cc on I/O cath that he was unable to void. Pt reports no problems until recently w/ voiding after TURP in 12/2019.  - UCX then [facility] in am if needed - consider adding foley to allow bladder to decompress if continues to need I/O - I/O for the time being.  - Hold hom Myrbetriq  CKD IV: Cr at baseline 3.6. Anticipate elevation during active diuresis - BMP in am - Continue Aranesp  Anemia: secondayr to rneal ifailure.. At baseline - CBC in am - Continue EPO Q14 days if needed.   HTN: - continue Norvasc, Metop - Hydralazine PRN  DM: on 70/30 at home - SSI  HLD: - continue statin  Chronic pain: - ontinue Tramadol   DVT prophylaxis: Hep  Code Status: full  Family Communication: none  Disposition Plan: pending improvement  in respiratory status  Consults  called: None  Admission status: Inpt    Waldemar Dickens MD [facility]  If 7PM-7AM, please contact night-coverage [email or web address] Password [facility]  03/23/2020, 7:30 PM

## 2020-03-23 NOTE — ED Notes (Signed)
Pt states he has not received the covid vaccines.

## 2020-03-23 NOTE — ED Triage Notes (Signed)
Pt presents to ED via [facility] for SOB and lower abdominal pain. Pt sats 85% on RA upon EMS arrival. Pt states been SOB and lower abdominal x 3 weeks. Pt denies O2 use at home. Pt placed on 3L

## 2020-03-23 NOTE — ED Provider Notes (Signed)
[facility] EMERGENCY DEPARTMENT Provider Note   CSN: [id number] Arrival date & time: 03/23/20  1031     History Chief Complaint  Patient presents with  . Shortness of Breath    Danny [name] is a 64 y.o. male with history of BPH, urinary retention previous chronic foley s/p TURP 12/2019, CKD, anemia, HTN, DM, CHF EF 65 %, pleural effusions presents for lower abdominal pain for a couple of weeks. He is concerned about a bladder infection.  Patient noted to be hypoxic by EMS 85% and 78% here in ED on RA. States he has been short of breath for 3 weeks which has also worsened worsened, worse with movement and activity.  states he had similar symptoms January 2021 when he got admitted for a bladder infection and congestive heart failure.     He endorses intermittent lower abdominal and lower back pain for 2 weeks. This is worse with walking and after eating.  His urine has been darker, brown with red tinge for the last 1 week.  No dysuria. States after his prostate surgery 12/2019 by Dr Alyson Ingles he has no longer needed a foley catheter but has noticed decreased urine out put today. Used a urinal at home and approximately urinated 150 cc of urine so far today. Has felt warm but doesn't think he has had a true fever. No chills. No nausea, vomiting, changes in BM. Admits to non compliance with fluid pills for a couple of days.    Patient states "I think I caught a little virus". Reports intermittent cough with white phlegm that is worse at night.  Has associated nasal congestion, rhinorrhea and left sided sore throat that is mild.  Initially thought it was his allergies. Has been using flonase with relief in nasal congestion.  Thinks his cough is worse at night because he turns on the heat at night, feels better with the [facility] on.  Unvaccinated for [place].  Unknown exposures to COVID. No CP. Has been Sob for 3 weeks, worse with exertion and activity.  Does not use oxygen at home.  Needed oxygen at last  admission in January but states he was weaned off of it.  No leg swelling. Missed a few days of fluid pills and urinating less, as above. No changes in smell or taste.  No blood thinners, only aspirin.  HPI     Past Medical History:  Diagnosis Date  . Anemia   . Arthritis   . CKD (chronic kidney disease), stage IV ([place])   . Diabetes mellitus without complication ([place])   . Foot ulcer ([place])   . Hypertension   . Urinary retention     Patient Active Problem List   Diagnosis Date Noted  . Respiratory failure ([place]) 03/23/2020  . UTI (urinary tract infection), bacterial 01/22/2020  . Acute on chronic anemia 01/16/2020  . Benign prostatic hyperplasia with urinary obstruction 01/15/2020  . AKI (acute kidney injury) ([place]) 07/08/2019  . Hypoxia 07/08/2019  . Stage I pressure ulcer of right ankle 07/08/2019  . Edema of both lower extremities   . Anasarca 03/23/2019  . Bladder outlet obstruction 03/23/2019  . Yeast infection of the skin 03/16/2019  . Diabetic ulcer of left foot ([place]) 03/15/2019  . Diabetic ulcer of right ankle ([place]) 03/15/2019  . Hypertension associated with stage 3 chronic kidney disease due to type 2 diabetes mellitus ([place]) 03/09/2019  . Controlled type 2 diabetes mellitus with stage 3 chronic kidney disease, with long-term current use of insulin ([place])  03/09/2019  . Dyslipidemia associated with type 2 diabetes mellitus ([place]) 03/09/2019  . Chronic gout due to renal impairment without tophus 03/09/2019  . Emphysematous cystitis 03/05/2019  . Bilateral hydronephrosis   . Bilateral cellulitis of lower leg 03/03/2019  . Urinary retention 03/03/2019  . Klebsiella Cystitis 03/03/2019  . UTI due to Klebsiella species 03/03/2019  . Plantar ulcer of left foot ([place]) 03/03/2019  . Acute on chronic diastolic (congestive) heart failure ([place]) 02/25/2019  . Acute respiratory failure with hypoxia ([place]) 02/25/2019  . Acute renal failure superimposed on stage 3 chronic kidney disease  ([place]) 02/24/2019  . Congestive heart failure ([place]) 02/24/2019  . Dyspnea 02/23/2019  . Essential hypertension 02/23/2019  . Diabetes mellitus ([place]) 02/23/2019  . CKD (chronic kidney disease) 02/23/2019  . Psoriasis 02/23/2019    Past Surgical History:  Procedure Laterality Date  . ANKLE SURGERY Right   . CHOLECYSTECTOMY    . FOOT SURGERY Right   . TRANSURETHRAL RESECTION OF PROSTATE N/A 01/15/2020   Procedure: TRANSURETHRAL RESECTION OF THE PROSTATE (TURP)  with General anesthesia and spinal;  Surgeon: Cleon Gustin, MD;  Location: [facility];  Service: Urology;  Laterality: N/A;       No family history on file.  Social History   Tobacco Use  . Smoking status: Never Smoker  . Smokeless tobacco: Never Used  Vaping Use  . Vaping Use: Never used  Substance Use Topics  . Alcohol use: No  . Drug use: No    Home Medications Prior to Admission medications   Medication Sig Start Date End Date Taking? Authorizing Provider  ACCU-CHEK SMARTVIEW test strip 1 each by Other route as needed.  05/15/19   [provider]  acetaminophen (TYLENOL) 500 MG tablet Take 1,000 mg by mouth every 6 (six) hours as needed for moderate pain or headache.    [provider]  amLODipine (NORVASC) 10 MG tablet Take 10 mg by mouth daily.  10/14/19   [provider]  Ascorbic Acid (VITAMIN C) 500 MG CHEW Chew 1,000 mg by mouth daily.    [provider]  aspirin 325 MG tablet Take 325 mg by mouth at bedtime.     [provider]  cefdinir (OMNICEF) 300 MG capsule Take 1 capsule (300 mg total) by mouth daily. 01/23/20   Irine Seal, MD  cholecalciferol (VITAMIN D3) 25 MCG (1000 UNIT) tablet Take 1,000 Units by mouth daily.    [provider]  Darbepoetin Alfa (ARANESP) 150 MCG/0.3ML SOSY injection Inject 0.3 mLs (150 mcg total) into the skin every Saturday at 6 PM. Patient taking differently: Inject 150 mcg into the skin every 14 (fourteen) days.  07/19/19    Barton Dubois, MD  DROPLET INSULIN SYRINGE 31G X 5/16" 1 ML [place]  10/29/19   [provider]  epoetin alfa-epbx (RETACRIT) 4000 UNIT/ML injection 4,000 Units every 14 (fourteen) days.    Bhutani, Manpreet S, MD  fluticasone (FLONASE) 50 MCG/ACT nasal spray Place 2 sprays into both nostrils daily as needed for allergies.  02/11/19   [provider]  Ketotifen Fumarate (ALLERGY EYE DROPS OP) Place 1 drop into both eyes daily as needed (allergies/dry eyes).    [provider]  Magnesium 100 MG CAPS Take 100 mg by mouth daily.     [provider]  metoprolol succinate (TOPROL-XL) 100 MG 24 hr tablet Take 1 tablet (100 mg total) by mouth every evening. 03/20/19   Gerlene Fee, NP  mirabegron ER (MYRBETRIQ) 25  MG TB24 tablet Take 1 tablet (25 mg total) by mouth daily. 07/02/19   [name], [name], MD  Multiple Vitamin (MULTIVITAMIN WITH MINERALS) TABS tablet Take 1 tablet by mouth daily.    [provider]  NOVOLIN 70/30 RELION (70-30) 100 UNIT/ML injection Inject 20-100 Units into the skin in the morning and at bedtime.  08/13/19   [provider]  Omega-3 Fatty Acids (FISH OIL PO) Take 2,400 mg by mouth 2 (two) times daily.    [provider]  pravastatin (PRAVACHOL) 80 MG tablet Take 1 tablet (80 mg total) by mouth every evening. 03/20/19   Gerlene Fee, NP  torsemide (DEMADEX) 20 MG tablet Take by mouth. 02/19/20 04/19/20  [provider]  traMADol (ULTRAM) 50 MG tablet Take 1 tablet (50 mg total) by mouth every 6 (six) hours as needed. 01/16/20 01/15/21  Cleon Gustin, MD  vitamin B-12 (CYANOCOBALAMIN) 1000 MCG tablet Take 1,000 mcg by mouth daily.    [provider]    Allergies    Dust mite extract, Prednisone, and Rocephin [ceftriaxone]  Review of Systems   Review of Systems  HENT: Positive for congestion, postnasal drip, rhinorrhea and sore throat.   Respiratory: Positive for cough and shortness of  breath.   Gastrointestinal: Positive for abdominal pain.  Genitourinary: Positive for decreased urine volume and difficulty urinating.  Musculoskeletal: Positive for back pain.  All other systems reviewed and are negative.   Physical Exam Updated Vital Signs BP (!) 168/85   Pulse (!) 58   Temp (!) 97.4 F (36.3 C) (Oral)   Resp 19   Ht 6\' 1"  (1.854 m)   Wt 131.5 kg   SpO2 97%   BMI 38.26 kg/m   Physical Exam Vitals and nursing note reviewed.  Constitutional:      Appearance: He is well-developed.     Comments: Chronically ill appearing, pale. Non toxic however   HENT:     Head: Normocephalic and atraumatic.     Nose: Nose normal.     Mouth/Throat:     Pharynx: No posterior oropharyngeal erythema.     Comments: Lips are dry. Mild erythema tonsillar pillars bilaterally. No edema or exudates or lesions of tonsils, uvula. No trismus. Tolerating secretions. Normal sublingual space. Normal phonation.  Eyes:     Conjunctiva/sclera: Conjunctivae normal.  Neck:     Comments: No anterior/lateral neck tenderness. No cervical LAD. Trachea midline  Cardiovascular:     Rate and Rhythm: Normal rate and regular rhythm.     Heart sounds: Normal heart sounds.     Comments: Trace edema pretibial, symmetric. No calf tenderness.  Pulmonary:     Breath sounds: Examination of the right-lower field reveals decreased breath sounds. Examination of the left-lower field reveals decreased breath sounds. Decreased breath sounds present.     Comments: SpO2  90% on RA at rest, patient has mild increased work of breathing on RA. Improves with 3 L [place]. Diminished air sounds in lower lobes left greater than right. No wheezing, no crackles.  Abdominal:     General: Bowel sounds are normal.     Palpations: Abdomen is soft.     Tenderness: There is abdominal tenderness.     Comments: Protuberant/obese abdomen.  Mild suprapubic and LLQ abdominal tenderness with deep palpation.  No obvious suprapubic  distention.  No CVA tenderness bilaterally   Musculoskeletal:        General: Normal range of motion.     Cervical back: Normal  range of motion.  Skin:    General: Skin is warm and dry.     Capillary Refill: Capillary refill takes less than 2 seconds.  Neurological:     Mental Status: He is alert.  Psychiatric:        Behavior: Behavior normal.     ED Results / Procedures / Treatments   Labs (all labs ordered are listed, but only abnormal results are displayed) Labs Reviewed  CBC WITH DIFFERENTIAL/PLATELET - Abnormal; Notable for the following components:      Result Value   RBC 3.06 (*)    Hemoglobin 8.5 (*)    HCT 27.4 (*)    RDW 18.6 (*)    All other components within normal limits  COMPREHENSIVE METABOLIC PANEL - Abnormal; Notable for the following components:   Glucose, Bld 275 (*)    BUN 62 (*)    Creatinine, Ser 3.91 (*)    Calcium 8.8 (*)    Albumin 3.3 (*)    Total Bilirubin 1.5 (*)    GFR calc non Af Amer 15 (*)    GFR calc Af Amer 17 (*)    All other components within normal limits  URINALYSIS, ROUTINE W REFLEX MICROSCOPIC - Abnormal; Notable for the following components:   Glucose, UA >=500 (*)    Hgb urine dipstick LARGE (*)    Protein, ur >=300 (*)    Leukocytes,Ua TRACE (*)    RBC / HPF >50 (*)    All other components within normal limits  BRAIN NATRIURETIC PEPTIDE - Abnormal; Notable for the following components:   B Natriuretic Peptide 351.0 (*)    All other components within normal limits  RESPIRATORY PANEL BY RT PCR (FLU A&B, COVID)  URINE CULTURE    EKG EKG Interpretation  Date/Time:  Tuesday March 23 2020 10:50:27 EDT Ventricular Rate:  56 PR Interval:    QRS Duration: 80 QT Interval:  450 QTC Calculation: 434 R Axis:   87 Text Interpretation: Junctional rhythm Abnormal ECG No significant change since last tracing Confirmed by Dorie Rank [phone]) on 03/23/2020 11:04:00 AM   Radiology CT Chest Wo Contrast  Result Date:  03/23/2020 CLINICAL DATA:  Respiratory failure. Shortness of breath. Lower abdominal pain. Symptoms for 3 weeks. EXAM: CT CHEST WITHOUT CONTRAST TECHNIQUE: Multidetector CT imaging of the chest was performed following the standard protocol without IV contrast. COMPARISON:  Plain film earlier today. No prior chest CT. A CT of the abdomen of 01/22/2020 is reviewed. FINDINGS: Cardiovascular: Aortic and branch vessel atherosclerosis. Mild cardiomegaly, without pericardial effusion. Multivessel coronary artery atherosclerosis. Pulmonary artery enlargement, outflow tract 3.9 cm Mediastinum/Nodes: Multiple small middle mediastinal nodes. None pathologic by size criteria. Hilar regions poorly evaluated without intravenous contrast. Lungs/Pleura: Small, right larger than left pleural effusions. Minimal motion degradation. Right lower lobe dependent atelectasis. Areas of minimal posterior right upper lobe dependent atelectasis. Mild patchy ground-glass opacity with areas of mild smooth septal thickening. Scattered calcified and noncalcified pulmonary nodules. Noncalcified nodules including within the right upper lobe at 3 mm on 57/4, 3 mm on 61/4. Upper Abdomen: Moderate to marked cirrhosis. Cholecystectomy. Normal imaged portions of the spleen, stomach, pancreas, right adrenal gland, kidneys. 2.9 cm low-density left adrenal nodule, technically indeterminate based on density measurements today, but on the order of 7-9 HU on 01/22/2020, most consistent with an adenoma. Musculoskeletal: Cervical and thoracic spondylosis. IMPRESSION: 1. Constellation of findings, including bilateral pleural effusions, smooth septal thickening, and scattered mild ground-glass, which are most consistent with fluid overload,  likely secondary to congestive heart failure. 2. Tiny pulmonary nodules as detailed above. No follow-up needed if patient is low-risk. Non-contrast chest CT can be considered in 12 months if patient is high-risk. This  recommendation follows the consensus statement: Guidelines for Management of Incidental Pulmonary Nodules Detected on CT Images: From the Fleischner Society 2017; Radiology 2017; 284:228-243. 3. Cirrhosis. 4. Left adrenal adenoma. 5. Coronary artery atherosclerosis. Aortic Atherosclerosis (ICD10-I70.0). 6. Pulmonary artery enlargement suggests pulmonary arterial hypertension. Electronically Signed   By: Abigail Miyamoto M.D.   On: 03/23/2020 17:49   DG Chest Port 1 View  Result Date: 03/23/2020 CLINICAL DATA:  Shortness of breath.  Abdominal pain. EXAM: PORTABLE CHEST 1 VIEW COMPARISON:  07/09/2019. FINDINGS: Stable cardiomegaly. Bilateral pulmonary infiltrates/edema, improved from prior exam. Small bilateral pleural effusions again noted. No pneumothorax. IMPRESSION: Stable cardiomegaly. Bilateral pulmonary infiltrates/edema, improved from prior exam. Small bilateral pleural effusions again noted. Electronically Signed   By: Marcello Moores  Register   On: 03/23/2020 11:14    Procedures .Critical Care Performed by: Kinnie Feil, PA-C Authorized by: Kinnie Feil, PA-C   Critical care provider statement:    Critical care time (minutes):  45   Critical care was necessary to treat or prevent imminent or life-threatening deterioration of the following conditions:  Respiratory failure   Critical care was time spent personally by me on the following activities:  Discussions with consultants, evaluation of patient's response to treatment, examination of patient, ordering and performing treatments and interventions, ordering and review of laboratory studies, ordering and review of radiographic studies, pulse oximetry, re-evaluation of patient's condition, obtaining history from patient or surrogate, review of old charts and development of treatment plan with patient or surrogate   I assumed direction of critical care for this patient from another provider in my specialty: no     (including critical care  time)  Medications Ordered in ED Medications  furosemide (LASIX) injection 20 mg (20 mg Intravenous Given 03/23/20 1635)    ED Course  I have reviewed the triage vital signs and the nursing notes.  Pertinent labs & imaging results that were available during my care of the patient were reviewed by me and considered in my medical decision making (see chart for details).  Clinical Course as of Mar 23 1852  Tue Mar 23, 2020  1405 Hemoglobin(!): 8.5 [CG]  1405 Creatinine(!): 3.91 [CG]  1405 GFR, Est Non African American(!): 15 [CG]  1406 IMPRESSION: Stable cardiomegaly. Bilateral pulmonary infiltrates/edema, improved from prior exam. Small bilateral pleural effusions again noted.  DG Chest Port 1 View [CG]  1406 Junctional rhythm Abnormal ECG No significant change since last tracing Confirmed by Dorie Rank [phone]) on 03/23/2020 11:04:00 AM  ED EKG [CG]  1417 Has urinated once today in a urinal approx 150 cc    [CG]  1418 Has not taken fluid pills for "a couple of days"   [CG]  1605 Re-evaluated patient. States he cannot void. Bladder scan reveals 400 cc.  States he has not drank water or taken his fluid pills in a few days   [CG]  1650 Leukocytes,Ua(!): TRACE [CG]  1650 RBC / HPF(!): >50 [CG]  1650 WBC, UA: 21-50 [CG]  1650 Bacteria, UA: NONE SEEN [CG]  1651 Glucose, UA(!): >=500 [CG]  1813 IMPRESSION: 1. Constellation of findings, including bilateral pleural effusions, smooth septal thickening, and scattered mild ground-glass, which are most consistent with fluid overload, likely secondary to congestive heart failure. 2. Tiny pulmonary nodules as detailed above. No  follow-up needed if patient is low-risk. Non-contrast chest CT can be considered in 12 months if patient is high-risk. This recommendation follows the consensus statement: Guidelines for Management of Incidental Pulmonary Nodules Detected on CT Images: From the Fleischner Society 2017; Radiology 2017; 284:228-243. 3.  Cirrhosis. 4. Left adrenal adenoma. 5. Coronary artery atherosclerosis. Aortic Atherosclerosis (ICD10-I70.0). 6. Pulmonary artery enlargement suggests pulmonary arterial hypertension.  CT Chest Wo Contrast [CG]    Clinical Course User Index [CG] Kinnie Feil, PA-C   MDM Rules/Calculators/A&P                          Patient's EMR, triage nursing notes reviewed to assist with history and MDM.  64 yo M presents with lower abdominal and back pain, darker urine and decreased UOP for 1-2 weeks as well as shortness of breath, cough, sore throat, congestion for 3 weeks.  Hypoxic 85% on RA. Does not use oxygen at home.    History of TURP July 2021, has been voiding without a Foley since but urine output has decreased. Patient unable to void in ER, 400 cc in bladder scan, needed in and out for UA. History of HFpEF EF 65% on last echo. On torsemide 40 mg BID at home but non compliant for "a couple of days" in setting of minimal UOP today.    Ddx includes UTI vs recurrent post renal obstruction, non compliant with diuretics leading to acute CHF. He reports URI symptoms, unvaccinated, COVID, influenza PNA also a possibility.  ER work up initiated in triage including labs. I have ordered BNP, COVID, UA, urine culture.   1650 I have personally visualized and interpreted ER work up  Anemia at baseline.  Creatinine 3.91 and GFR 15, around his baseline.  I&O urine not convincing for infection.  Will send for culture.  CXR with known pleural effusion, improved from previous. BNP 351, slightly higher than base line 100-200.   Surprisingly COVID is negative.  Influenza A&B negative. No leukocytosis.   Patient re-evaluated, no significant clinical decline. Remains hypoxic requiring 3 L [place].   Patient shared with EDP Zackowski.  Concern for PE is low as he has no CP.  Highest suspicion clinically is for mild/early CHF decompensation or subclinical PNA. Will obtain CT chest without contrast given  creatinine/GFR that may pick up pulmonary edema, infiltrate, etc.    Ordered 20 mg lasix.   Anticipate admission for hypoxia, pending CT chest.   1835: CT chest reveals findings most consistent with fluid overload.  This fits clinical picture. Patient has voided approx 500 cc UOP today. May need urology consult once admitted. Gentle diuresis given GFR/creatinine. Spoke to hospitalist Dr Marily Memos who will admit patient.  Final Clinical Impression(s) / ED Diagnoses Final diagnoses:  Hypoxia  Urinary retention    Rx / DC Orders ED Discharge Orders    None       Arlean Hopping 03/23/20 1853    Dorie Rank, MD 03/24/20 1300

## 2020-03-24 DIAGNOSIS — R0902 Hypoxemia: Secondary | ICD-10-CM

## 2020-03-24 LAB — COMPREHENSIVE METABOLIC PANEL
ALT: 36 U/L (ref 0–44)
AST: 19 U/L (ref 15–41)
Albumin: 3.1 g/dL — ABNORMAL LOW (ref 3.5–5.0)
Alkaline Phosphatase: 102 U/L (ref 38–126)
Anion gap: 9 (ref 5–15)
BUN: 64 mg/dL — ABNORMAL HIGH (ref 8–23)
CO2: 22 mmol/L (ref 22–32)
Calcium: 8.7 mg/dL — ABNORMAL LOW (ref 8.9–10.3)
Chloride: 105 mmol/L (ref 98–111)
Creatinine, Ser: 3.94 mg/dL — ABNORMAL HIGH (ref 0.61–1.24)
GFR calc Af Amer: 17 mL/min — ABNORMAL LOW (ref >60)
GFR calc non Af Amer: 15 mL/min — ABNORMAL LOW (ref >60)
Glucose, Bld: 208 mg/dL — ABNORMAL HIGH (ref 70–99)
Potassium: 4.6 mmol/L (ref 3.5–5.1)
Sodium: 136 mmol/L (ref 135–145)
Total Bilirubin: 1.2 mg/dL (ref 0.3–1.2)
Total Protein: 7.2 g/dL (ref 6.5–8.1)

## 2020-03-24 LAB — URINE CULTURE: Culture: NO GROWTH

## 2020-03-24 LAB — PHOSPHORUS: Phosphorus: 5.2 mg/dL — ABNORMAL HIGH (ref 2.5–4.6)

## 2020-03-24 LAB — CBC
HCT: 26.4 % — ABNORMAL LOW (ref 39.0–52.0)
Hemoglobin: 8.2 g/dL — ABNORMAL LOW (ref 13.0–17.0)
MCH: 28 pg (ref 26.0–34.0)
MCHC: 31.1 g/dL (ref 30.0–36.0)
MCV: 90.1 fL (ref 80.0–100.0)
Platelets: 181 10*3/uL (ref 150–400)
RBC: 2.93 MIL/uL — ABNORMAL LOW (ref 4.22–5.81)
RDW: 18.7 % — ABNORMAL HIGH (ref 11.5–15.5)
WBC: 8.7 10*3/uL (ref 4.0–10.5)
nRBC: 0 % (ref 0.0–0.2)

## 2020-03-24 LAB — GLUCOSE, CAPILLARY: Glucose-Capillary: 199 mg/dL — ABNORMAL HIGH (ref 70–99)

## 2020-03-24 LAB — HEMOGLOBIN A1C
Hgb A1c MFr Bld: 7.1 % — ABNORMAL HIGH (ref 4.8–5.6)
Mean Plasma Glucose: 157.07 mg/dL

## 2020-03-24 LAB — CBG MONITORING, ED
Glucose-Capillary: 180 mg/dL — ABNORMAL HIGH (ref 70–99)
Glucose-Capillary: 250 mg/dL — ABNORMAL HIGH (ref 70–99)
Glucose-Capillary: 201 mg/dL — ABNORMAL HIGH (ref 70–99)

## 2020-03-24 LAB — MAGNESIUM: Magnesium: 2.4 mg/dL (ref 1.7–2.4)

## 2020-03-24 LAB — HIV ANTIBODY (ROUTINE TESTING W REFLEX): HIV Screen 4th Generation wRfx: NONREACTIVE

## 2020-03-24 MED ORDER — MAGNESIUM OXIDE 400 (241.3 MG) MG PO TABS
200.0000 mg | ORAL_TABLET | Freq: Every day | ORAL | Status: DC
  Administered 2020-03-24 – 2020-03-31 (×8): 200 mg via ORAL
  Filled 2020-03-24 (×8): qty 1

## 2020-03-24 NOTE — ED Notes (Signed)
Pt eating breakfast at this time.  

## 2020-03-24 NOTE — Progress Notes (Signed)
PROGRESS NOTE    Danny [name]  GYJ:[id number] DOB: [birth year] DOA: 03/23/2020 PCP: Celene Squibb, MD    Chief Complaint  Patient presents with  . Shortness of Breath    Brief Narrative:  As per H&P written by Dr. Marily Memos on 03/23/20 Danny [name] is a 64 y.o. male with medical history significant of CKD IV, DM, HTN, Urinary retention, BPH s/p TURP, anemia of chronic disease.   Pt complaining of intermittetn back and lower abd pain for 2 wks. Increasing in intensity and frequency. Urine has turned red and brown for the past week. Deniea dysuria, frequency, fever, myalgia. Since TURP in July pt has not needed to self catheterize. Over the past day pt endorses decreased UOP (150cc total on day of admission). Increasing LE swelling over this period of time as well. Pt also reporting intermittent cough, nasal congestion, rinorrhea, and sore throat and SOB, DOE,  that started 3 wks ago. No COVID vaccine. Denies, loss of taste or smell. Pt does endorse missing some doses of diuretic recewntly  ED Course: Pt hjypoxic on admissoin to ED to 78% on RA. No  Home O2 requirement.    Assessment & Plan: 1-acute respiratory failure with hypoxia: Secondary to acute on chronic diastolic heart failure and bronchitis. -Slowly improving and requiring less oxygen supplementation at this time -Still complaining of orthopnea and shortness of breath -Continue to follow daily weights, low-sodium diet IV diuresis -Continue to follow strict I's and O's -Patient reports no having a full compliance with adjusted dose of diuretics as recommended by his nephrologist; it was making impossible for him to leave the house due to constant urination. -Continue supportive care and follow clinical response. -Close monitoring of renal function and electrolytes will be provided. -Continue telemetry monitoring.  2-Essential hypertension -Stable -Continue current antihypertensive regimen -Follow heart healthy diet.  3-Diabetes  mellitus (HCC) with nephropathy -A1C 7.1 -continue SSI -will follow CBG and adjust hypoglycemic regimen  4-CKD (chronic kidney disease) stage 4 -Cr and GFR essentially at his baseline (with anticipated fluctuation) -will follow trend closely -continue diuresis -maintain adequate hydration -minimize nephrotoxic agents  5-Dyslipidemia associated with type 2 diabetes mellitus ([place]) -continue statins   6-presumed UTI -will continue rocephin -follow urine culture results  7-Benign prostatic hyperplasia with urinary obstruction -status post prostate surgery -denying retention symptoms currently  8-anemia of chronic disease -no signs of overt bleeding -will follow Hgb trend -currently 8.2 -will give retacrit infusion.   DVT prophylaxis: heparin  Code Status: Full Code Family Communication: no family at bedside; patient expressed he will update family himself. Disposition:   Status is: Inpatient  Dispo: The patient is from: home              Anticipated d/c is to: home               Anticipated d/c date is: 2 days or so.              Patient currently is not medically stable for discharge currently; still complaining of shortness of breath, complaining of orthopnea and with signs of mild fluid overload.  Patient also requiring oxygen supplementation (which is new for him).  Continue IV diuresis, continue treatment for UTI.  Continue to follow clinical response.     Consultants:   None   Procedures:  See below for x-ray reports.   Antimicrobials:  Ceftriaxone.   Subjective: Still complaining of orthopnea and shortness of breath; using 2-3 L nasal cannula supplementation.  No fever,  no chest pain, no nausea or vomiting.  Objective: Vitals:   03/24/20 1415 03/24/20 1430 03/24/20 1500 03/24/20 1530  BP:  139/72 (!) 143/66 (!) 143/66  Pulse: (!) 58  (!) 54 66  Resp: 19 16 12 16   Temp:    (!) 97 F (36.1 C)  TempSrc:    Oral  SpO2: 92%  91% 98%  Weight:       Height:        Intake/Output Summary (Last 24 hours) at 03/24/2020 1539 Last data filed at 03/23/2020 1626 Gross per 24 hour  Intake --  Output 500 ml  Net -500 ml   Filed Weights   03/23/20 1046  Weight: 131.5 kg    Examination:  General exam: Afebrile, no chest pain, no nausea, no vomiting.  Reports breathing is a slightly better.  Still complaining of orthopnea and shortness of breath on exertion. Respiratory system: Fine crackles at the bases; no wheezing.  Patient with 2-3 L nasal cannula supplementation at this time.  No using accessory muscles. Cardiovascular system: RRR, unable to properly assess JVD with body habitus; no rubs, no gallops.  Trace-1+ edema bilaterally. Gastrointestinal system: Abdomen is obese, nondistended, soft and nontender. No organomegaly or masses felt. Normal bowel sounds heard. Central nervous system: Alert and oriented. No focal neurological deficits. Extremities: No cyanosis or clubbing. Skin: No rashes, no petechiae. Psychiatry: Judgement and insight appear normal. Mood & affect appropriate.     Data Reviewed: I have personally reviewed following labs and imaging studies  CBC: Recent Labs  Lab 03/23/20 1205 03/24/20 0550  WBC 9.0 8.7  NEUTROABS 7.6  --   HGB 8.5* 8.2*  HCT 27.4* 26.4*  MCV 89.5 90.1  PLT 194 607    Basic Metabolic Panel: Recent Labs  Lab 03/23/20 1205 03/24/20 0550  NA 137 136  K 4.7 4.6  CL 106 105  CO2 23 22  GLUCOSE 275* 208*  BUN 62* 64*  CREATININE 3.91* 3.94*  CALCIUM 8.8* 8.7*  MG  --  2.4  PHOS  --  5.2*    GFR: Estimated Creatinine Clearance: 26.2 mL/min (A) (by C-G formula based on SCr of 3.94 mg/dL (H)).  Liver Function Tests: Recent Labs  Lab 03/23/20 1205 03/24/20 0550  AST 22 19  ALT 43 36  ALKPHOS 113 102  BILITOT 1.5* 1.2  PROT 7.4 7.2  ALBUMIN 3.3* 3.1*    CBG: Recent Labs  Lab 03/24/20 0757 03/24/20 1137  GLUCAP 180* 250*     Recent Results (from the past 240  hour(s))  Respiratory Panel by RT PCR (Flu A&B, Covid) - Nasopharyngeal Swab     Status: None   Collection Time: 03/23/20  2:25 PM   Specimen: Nasopharyngeal Swab  Result Value Ref Range Status   SARS Coronavirus 2 by RT PCR NEGATIVE NEGATIVE Final    Comment: (NOTE) SARS-CoV-2 target nucleic acids are NOT DETECTED.  The SARS-CoV-2 RNA is generally detectable in upper respiratoy specimens during the acute phase of infection. The lowest concentration of SARS-CoV-2 viral copies this assay can detect is 131 copies/mL. A negative result does not preclude SARS-Cov-2 infection and should not be used as the sole basis for treatment or other patient management decisions. A negative result may occur with  improper specimen collection/handling, submission of specimen other than nasopharyngeal swab, presence of viral mutation(s) within the areas targeted by this assay, and inadequate number of viral copies (<131 copies/mL). A negative result must be combined with clinical observations, patient  history, and epidemiological information. The expected result is Negative.  Fact Sheet for Patients:  PinkCheek.be  Fact Sheet for Healthcare Providers:  GravelBags.it  This test is no t yet approved or cleared by the [place] FDA and  has been authorized for detection and/or diagnosis of SARS-CoV-2 by FDA under an Emergency Use Authorization (EUA). This EUA will remain  in effect (meaning this test can be used) for the duration of the COVID-19 declaration under Section 564(b)(1) of the Act, 21 U.S.C. section 360bbb-3(b)(1), unless the authorization is terminated or revoked sooner.     Influenza A by PCR NEGATIVE NEGATIVE Final   Influenza B by PCR NEGATIVE NEGATIVE Final    Comment: (NOTE) The Xpert Xpress SARS-CoV-2/FLU/RSV assay is intended as an aid in  the diagnosis of influenza from Nasopharyngeal swab specimens and  should not  be used as a sole basis for treatment. Nasal washings and  aspirates are unacceptable for Xpert Xpress SARS-CoV-2/FLU/RSV  testing.  Fact Sheet for Patients: PinkCheek.be  Fact Sheet for Healthcare Providers: GravelBags.it  This test is not yet approved or cleared by the [place] FDA and  has been authorized for detection and/or diagnosis of SARS-CoV-2 by  FDA under an Emergency Use Authorization (EUA). This EUA will remain  in effect (meaning this test can be used) for the duration of the  Covid-19 declaration under Section 564(b)(1) of the Act, 21  U.S.C. section 360bbb-3(b)(1), unless the authorization is  terminated or revoked. Performed at [facility], [address], [place], [place] [address]      Radiology Studies: CT Chest Wo Contrast  Result Date: 03/23/2020 CLINICAL DATA:  Respiratory failure. Shortness of breath. Lower abdominal pain. Symptoms for 3 weeks. EXAM: CT CHEST WITHOUT CONTRAST TECHNIQUE: Multidetector CT imaging of the chest was performed following the standard protocol without IV contrast. COMPARISON:  Plain film earlier today. No prior chest CT. A CT of the abdomen of 01/22/2020 is reviewed. FINDINGS: Cardiovascular: Aortic and branch vessel atherosclerosis. Mild cardiomegaly, without pericardial effusion. Multivessel coronary artery atherosclerosis. Pulmonary artery enlargement, outflow tract 3.9 cm Mediastinum/Nodes: Multiple small middle mediastinal nodes. None pathologic by size criteria. Hilar regions poorly evaluated without intravenous contrast. Lungs/Pleura: Small, right larger than left pleural effusions. Minimal motion degradation. Right lower lobe dependent atelectasis. Areas of minimal posterior right upper lobe dependent atelectasis. Mild patchy ground-glass opacity with areas of mild smooth septal thickening. Scattered calcified and noncalcified pulmonary nodules. Noncalcified nodules  including within the right upper lobe at 3 mm on 57/4, 3 mm on 61/4. Upper Abdomen: Moderate to marked cirrhosis. Cholecystectomy. Normal imaged portions of the spleen, stomach, pancreas, right adrenal gland, kidneys. 2.9 cm low-density left adrenal nodule, technically indeterminate based on density measurements today, but on the order of 7-9 HU on 01/22/2020, most consistent with an adenoma. Musculoskeletal: Cervical and thoracic spondylosis. IMPRESSION: 1. Constellation of findings, including bilateral pleural effusions, smooth septal thickening, and scattered mild ground-glass, which are most consistent with fluid overload, likely secondary to congestive heart failure. 2. Tiny pulmonary nodules as detailed above. No follow-up needed if patient is low-risk. Non-contrast chest CT can be considered in 12 months if patient is high-risk. This recommendation follows the consensus statement: Guidelines for Management of Incidental Pulmonary Nodules Detected on CT Images: From the Fleischner Society 2017; Radiology 2017; 284:228-243. 3. Cirrhosis. 4. Left adrenal adenoma. 5. Coronary artery atherosclerosis. Aortic Atherosclerosis (ICD10-I70.0). 6. Pulmonary artery enlargement suggests pulmonary arterial hypertension. Electronically Signed   By: Adria Devon.D.  On: 03/23/2020 17:49   DG Chest Port 1 View  Result Date: 03/23/2020 CLINICAL DATA:  Shortness of breath.  Abdominal pain. EXAM: PORTABLE CHEST 1 VIEW COMPARISON:  07/09/2019. FINDINGS: Stable cardiomegaly. Bilateral pulmonary infiltrates/edema, improved from prior exam. Small bilateral pleural effusions again noted. No pneumothorax. IMPRESSION: Stable cardiomegaly. Bilateral pulmonary infiltrates/edema, improved from prior exam. Small bilateral pleural effusions again noted. Electronically Signed   By: [place]   On: 03/23/2020 11:14    Scheduled Meds: . amLODipine  10 mg Oral Daily  . furosemide  40 mg Intravenous BID  . heparin  5,000 Units  Subcutaneous Q8H  . insulin aspart  0-9 Units Subcutaneous TID WC  . magnesium oxide  200 mg Oral Daily  . metoprolol succinate  100 mg Oral QPM  . pravastatin  80 mg Oral QPM  . vitamin B-12  1,000 mcg Oral Daily   Continuous Infusions: . ciprofloxacin Stopped (03/23/20 2333)     LOS: 1 day    Time spent: 30 minutes    Barton Dubois, MD [facility]   To contact the attending provider between 7A-7P or the covering provider during after hours 7P-7A, please log into the web site [email or web address] and access using universal [place] password for that web site. If you do not have the password, please call the hospital operator.  03/24/2020, 3:39 PM

## 2020-03-24 NOTE — ED Notes (Signed)
Pt called out needing to use restroom. Pt's o2 was 87% and he could hardly hold his head up. I offered to get him a bed pain and he stated he could not use one. I told him he had to try because he could not stand earlier last night. Pt told me he was not using a bedpan and I informed him that was the only option at this time. Pt told me to get out of his room. Pt will not keep his oxygen on or pulse ox.

## 2020-03-24 NOTE — TOC Initial Note (Signed)
Transition of Care [facility]) - Initial/Assessment Note    Patient Details  Name: Danny [name] MRN: [id number] Date of Birth: 12/17/1955  [facility] CM/SW Contact:    Iona Beard, [place] Phone Number: 03/24/2020, 1:10 PM  Clinical Narrative:                 Pt admitted for essential lower abd pain and SOB. Pt high risk for readmission. TOC spoke with pt to complete assessment. Pt stated he lives alone, drives himself, and normally can complete ADL's without assistance. Pt has familial support though they do not live in the area. Pt has walker, rollator, wheelchair, and cane at home. Pt states that he will be agreeable to either SNF or HH. Pt has previously been to rehab at [facility] and [place]. Pt also states he has used [facility] but it did not last long due to complication with the RN. TOC informed pt that we will follow up once we have received PT eval and recommendations. TOC to follow.   CSW completed CHF screen with pt. CSW asked pt if he knew about CHF, pt states he has had it and knew about it. Pt states he does not weigh himself daily because he has trouble standing up straight and seeing weight. Pt states that he does try to eat a heart healthy diet. Pt takes admits to taking medications as prescribed. Pt states he does not have a cardiologist. Pt asked what cardiologist was, CSW informed pt this was a heart doctor.   Expected Discharge Plan: [place] Barriers to Discharge: Continued Medical Work up   Patient Goals and CMS Choice Patient states their goals for this hospitalization and ongoing recovery are:: Agreeable to SNF if needed or HH. CMS Medicare.gov Compare Post Acute Care list provided to:: Patient Choice offered to / list presented to : NA  Expected Discharge Plan and Services Expected Discharge Plan: [place] Choice: NA (Waiting for PT to evaluate pt.) Living arrangements for the past 2  months: Apartment                 DME Arranged: N/A DME Agency: NA       HH Arranged: NA HH Agency: NA        Prior Living Arrangements/Services Living arrangements for the past 2 months: Apartment Lives with:: Self Patient language and need for interpreter reviewed:: Yes Do you feel safe going back to the place where you live?: Yes          Current home services: DME (Cane, walker, rollator, and wheelchair.) Criminal Activity/Legal Involvement Pertinent to Current Situation/Hospitalization: No - Comment as needed  Activities of Daily Living      Permission Sought/Granted                  Emotional Assessment Appearance:: Appears stated age Attitude/Demeanor/Rapport: Engaged Affect (typically observed): Accepting Orientation: : Oriented to Place, Oriented to Self, Oriented to  Time, Oriented to Situation Alcohol / Substance Use: Not Applicable Psych Involvement: No (comment)  Admission diagnosis:  Respiratory failure ([place]) [J96.90] Patient Active Problem List   Diagnosis Date Noted  . Respiratory failure ([place]) 03/23/2020  . UTI (urinary tract infection), bacterial 01/22/2020  . Acute on chronic anemia 01/16/2020  . Benign prostatic hyperplasia with urinary obstruction 01/15/2020  . AKI (acute kidney injury) ([place]) 07/08/2019  . Hypoxia 07/08/2019  . Stage I pressure ulcer  of right ankle 07/08/2019  . Edema of both lower extremities   . Anasarca 03/23/2019  . Bladder outlet obstruction 03/23/2019  . Yeast infection of the skin 03/16/2019  . Diabetic ulcer of left foot ([place]) 03/15/2019  . Diabetic ulcer of right ankle ([place]) 03/15/2019  . Hypertension associated with stage 3 chronic kidney disease due to type 2 diabetes mellitus ([place]) 03/09/2019  . Controlled type 2 diabetes mellitus with stage 3 chronic kidney disease, with long-term current use of insulin ([place]) 03/09/2019  . Dyslipidemia associated with type 2 diabetes mellitus ([place]) 03/09/2019  . Chronic  gout due to renal impairment without tophus 03/09/2019  . Emphysematous cystitis 03/05/2019  . Bilateral hydronephrosis   . Bilateral cellulitis of lower leg 03/03/2019  . Urinary retention 03/03/2019  . Klebsiella Cystitis 03/03/2019  . UTI due to Klebsiella species 03/03/2019  . Plantar ulcer of left foot ([place]) 03/03/2019  . Acute on chronic diastolic (congestive) heart failure ([place]) 02/25/2019  . Acute respiratory failure with hypoxia ([place]) 02/25/2019  . Acute renal failure superimposed on stage 3 chronic kidney disease ([place]) 02/24/2019  . Congestive heart failure ([place]) 02/24/2019  . Dyspnea 02/23/2019  . Essential hypertension 02/23/2019  . Diabetes mellitus ([place]) 02/23/2019  . CKD (chronic kidney disease) 02/23/2019  . Psoriasis 02/23/2019   PCP:  Celene Squibb, MD Pharmacy:   [facility]  [address], [place] - [place] [address] [phone] [place] [address] Phone: [phone] Fax: [phone]     Social Determinants of Health (SDOH) Interventions    Readmission Risk Interventions Readmission Risk Prevention Plan 03/24/2020 07/11/2019 03/05/2019  Transportation Screening Complete Complete Complete  PCP or Specialist Appt within 3-5 Days Not Complete - Not Complete  Not Complete comments - - Going to facility and will be seen by facility medical director.  [place] or Home Care Consult Complete - Complete  Social Work Consult for Recovery Care Planning/Counseling Complete - Complete  Palliative Care Screening Not Applicable - Not Applicable  Medication Review (RN Care Manager) Complete Complete Complete  PCP or Specialist appointment within 3-5 days of discharge - Not Complete -  [place] or Home Care Consult - Complete -  SW Recovery Care/Counseling Consult - Complete -  Palliative Care Screening - Not Complete -  [place] - Complete -  Some recent data might be hidden

## 2020-03-24 NOTE — ED Notes (Signed)
Report called to [facility] 312.

## 2020-03-25 LAB — BASIC METABOLIC PANEL
Anion gap: 8 (ref 5–15)
BUN: 67 mg/dL — ABNORMAL HIGH (ref 8–23)
CO2: 23 mmol/L (ref 22–32)
Calcium: 8.4 mg/dL — ABNORMAL LOW (ref 8.9–10.3)
Chloride: 105 mmol/L (ref 98–111)
Creatinine, Ser: 4.21 mg/dL — ABNORMAL HIGH (ref 0.61–1.24)
GFR calc Af Amer: 16 mL/min — ABNORMAL LOW (ref >60)
GFR calc non Af Amer: 14 mL/min — ABNORMAL LOW (ref >60)
Glucose, Bld: 159 mg/dL — ABNORMAL HIGH (ref 70–99)
Potassium: 4.2 mmol/L (ref 3.5–5.1)
Sodium: 136 mmol/L (ref 135–145)

## 2020-03-25 LAB — GLUCOSE, CAPILLARY
Glucose-Capillary: 153 mg/dL — ABNORMAL HIGH (ref 70–99)
Glucose-Capillary: 234 mg/dL — ABNORMAL HIGH (ref 70–99)
Glucose-Capillary: 177 mg/dL — ABNORMAL HIGH (ref 70–99)
Glucose-Capillary: 176 mg/dL — ABNORMAL HIGH (ref 70–99)

## 2020-03-25 MED ORDER — CIPROFLOXACIN HCL 250 MG PO TABS: 500.0000 mg | ORAL_TABLET | Freq: Every day | ORAL | Status: DC

## 2020-03-25 MED ORDER — EPOETIN ALFA 4000 UNIT/ML IJ SOLN
4000.0000 [IU] | Freq: Once | INTRAMUSCULAR | Status: AC
  Administered 2020-03-25: 4000 [IU] via SUBCUTANEOUS
  Filled 2020-03-25: qty 1

## 2020-03-25 MED ORDER — AMLODIPINE BESYLATE 5 MG PO TABS
7.5000 mg | ORAL_TABLET | Freq: Every day | ORAL | Status: DC
  Administered 2020-03-26 – 2020-03-31 (×6): 7.5 mg via ORAL
  Filled 2020-03-25 (×6): qty 2

## 2020-03-25 MED ORDER — EPOETIN ALFA-EPBX 4000 UNIT/ML IJ SOLN
4000.0000 [IU] | Freq: Once | INTRAMUSCULAR | Status: DC
  Filled 2020-03-25: qty 1

## 2020-03-25 MED ORDER — INFLUENZA VAC A&B SA ADJ QUAD 0.5 ML IM PRSY
0.5000 mL | PREFILLED_SYRINGE | INTRAMUSCULAR | Status: AC
  Administered 2020-03-26: 0.5 mL via INTRAMUSCULAR
  Filled 2020-03-25: qty 0.5

## 2020-03-25 MED ORDER — FUROSEMIDE 10 MG/ML IJ SOLN
40.0000 mg | Freq: Every day | INTRAMUSCULAR | Status: DC
  Administered 2020-03-26 – 2020-03-29 (×4): 40 mg via INTRAVENOUS
  Filled 2020-03-25 (×4): qty 4

## 2020-03-25 NOTE — Plan of Care (Signed)

## 2020-03-25 NOTE — Progress Notes (Signed)
PROGRESS NOTE    Danny [name]  CBJ:[id number] DOB: Jul 26, 1955 DOA: 03/23/2020 PCP: Celene Squibb, MD    Chief Complaint  Patient presents with  . Shortness of Breath    Brief Narrative:  As per H&P written by Dr. Marily Memos on 03/23/20 Danny [name] is a 64 y.o. male with medical history significant of CKD IV, DM, HTN, Urinary retention, BPH s/p TURP, anemia of chronic disease.   Pt complaining of intermittetn back and lower abd pain for 2 wks. Increasing in intensity and frequency. Urine has turned red and brown for the past week. Deniea dysuria, frequency, fever, myalgia. Since TURP in July pt has not needed to self catheterize. Over the past day pt endorses decreased UOP (150cc total on day of admission). Increasing LE swelling over this period of time as well. Pt also reporting intermittent cough, nasal congestion, rinorrhea, and sore throat and SOB, DOE,  that started 3 wks ago. No COVID vaccine. Denies, loss of taste or smell. Pt does endorse missing some doses of diuretic recewntly  ED Course: Pt hjypoxic on admissoin to ED to 78% on RA. No  Home O2 requirement.    Assessment & Plan: 1-acute respiratory failure with hypoxia: Secondary to acute on chronic diastolic heart failure and bronchitis. -Slowly improving and requiring less oxygen supplementation at this time -Still complaining of orthopnea and shortness of breath -Continue to follow daily weights, low-sodium diet and IV diuresis -Continue to follow strict I's and O's -Patient reports no having a full compliance with adjusted dose of diuretics as recommended by his nephrologist; it was making impossible for him to leave the house due to constant urination. -Continue supportive care and follow clinical response. -Close monitoring of renal function and electrolytes will be provided. -Continue telemetry monitoring.  2-Essential hypertension -Stable -Continue current antihypertensive regimen -Follow heart healthy  diet.  3-Diabetes mellitus (HCC) with nephropathy -A1C 7.1 -continue SSI -will follow CBG and adjust hypoglycemic regimen  4-CKD (chronic kidney disease) stage 4 -Cr and GFR essentially at his baseline (with anticipated fluctuation) -will follow trend closely -continue diuresis -maintain adequate hydration -minimize nephrotoxic agents  5-Dyslipidemia associated with type 2 diabetes mellitus ([place]) -continue statins   6-presumed UTI -will continue rocephin -follow urine culture results  7-Benign prostatic hyperplasia with urinary obstruction -status post prostate surgery -denying retention symptoms currently  8-anemia of chronic disease -no signs of overt bleeding -will follow Hgb trend -currently 8.2 -will give retacrit infusion.  9-obesity class 2 -Body mass index is 38.8 kg/m. -Low calorie diet, portion control and increase physical activity discussed with patient.   DVT prophylaxis: heparin  Code Status: Full Code Family Communication: no family at bedside; patient expressed he will update family himself. Disposition:   Status is: Inpatient  Dispo: The patient is from: home              Anticipated d/c is to: home               Anticipated d/c date is: 2 days or so.              Patient currently is not medically stable for discharge currently; still complaining of shortness of breath, complaining of orthopnea and with signs of mild fluid overload.  Patient also requiring oxygen supplementation (which is new for him).  Continue IV diuresis, dose adjusted based on his renal function.  No growth or microorganism isolated on patient's urine culture.  Will discontinue antibiotics.     Consultants:   None  Procedures:  See below for x-ray reports.   Antimicrobials:  Ceftriaxone.   Subjective: Requiring 2 L nasal cannula supplementation; no fever, no chest pain, no nausea, no vomiting.  Patient expressed feeling short of breath with activity and is still  having signs of fluid overload.  Objective: Vitals:   03/24/20 1530 03/24/20 1932 03/25/20 0300 03/25/20 1501  BP: (!) 143/66 (!) 116/51 (!) 154/67 (!) 145/66  Pulse: 66 (!) 58 63 (!) 58  Resp: 16 18 18 18   Temp: (!) 97 F (36.1 C) 97.7 F (36.5 C) 98.1 F (36.7 C) 97.9 F (36.6 C)  TempSrc: Oral Oral Oral Oral  SpO2: 98% 96% 97% 97%  Weight:   133.4 kg   Height:        Intake/Output Summary (Last 24 hours) at 03/25/2020 1538 Last data filed at 03/25/2020 0177 Gross per 24 hour  Intake 650 ml  Output 1600 ml  Net -950 ml   Filed Weights   03/23/20 1046 03/25/20 0300  Weight: 131.5 kg 133.4 kg    Examination: General exam: Alert, awake, oriented x 3; reports breathing is getting better; no complaining of orthopnea.  Still with significant physical deconditioning, feeling tired and short of breath with activity.  Using 2 L nasal cannula supplementation. Respiratory system: Scattered rhonchi, no wheezing, no using accessory muscles; decreased breath sounds at the bases. Cardiovascular system: No rubs, no gallops, unable to properly assess JVD due to body habitus.  Regular rate and rhythm. Gastrointestinal system: Abdomen is obese, nondistended, soft and nontender. No organomegaly or masses felt. Normal bowel sounds heard. Central nervous system: Alert and oriented. No focal neurological deficits. Extremities: No cyanosis or clubbing; trace to 1+ edema appreciated bilaterally. Skin: No petechiae; stage IV chronic wound in his right ankle; no active discharge or purulence.  Wound was present on admission. Psychiatry: Judgement and insight appear normal. Mood & affect appropriate.    Data Reviewed: I have personally reviewed following labs and imaging studies  CBC: Recent Labs  Lab 03/23/20 1205 03/24/20 0550  WBC 9.0 8.7  NEUTROABS 7.6  --   HGB 8.5* 8.2*  HCT 27.4* 26.4*  MCV 89.5 90.1  PLT 194 939    Basic Metabolic Panel: Recent Labs  Lab 03/23/20 1205  03/24/20 0550 03/25/20 0623  NA 137 136 136  K 4.7 4.6 4.2  CL 106 105 105  CO2 23 22 23   GLUCOSE 275* 208* 159*  BUN 62* 64* 67*  CREATININE 3.91* 3.94* 4.21*  CALCIUM 8.8* 8.7* 8.4*  MG  --  2.4  --   PHOS  --  5.2*  --     GFR: Estimated Creatinine Clearance: 24.7 mL/min (A) (by C-G formula based on SCr of 4.21 mg/dL (H)).  Liver Function Tests: Recent Labs  Lab 03/23/20 1205 03/24/20 0550  AST 22 19  ALT 43 36  ALKPHOS 113 102  BILITOT 1.5* 1.2  PROT 7.4 7.2  ALBUMIN 3.3* 3.1*    CBG: Recent Labs  Lab 03/24/20 1137 03/24/20 1800 03/24/20 2204 03/25/20 0744 03/25/20 1141  GLUCAP 250* 201* 199* 153* 234*     Recent Results (from the past 240 hour(s))  Respiratory Panel by RT PCR (Flu A&B, Covid) - Nasopharyngeal Swab     Status: None   Collection Time: 03/23/20  2:25 PM   Specimen: Nasopharyngeal Swab  Result Value Ref Range Status   SARS Coronavirus 2 by RT PCR NEGATIVE NEGATIVE Final    Comment: (NOTE) SARS-CoV-2 target nucleic acids  are NOT DETECTED.  The SARS-CoV-2 RNA is generally detectable in upper respiratoy specimens during the acute phase of infection. The lowest concentration of SARS-CoV-2 viral copies this assay can detect is 131 copies/mL. A negative result does not preclude SARS-Cov-2 infection and should not be used as the sole basis for treatment or other patient management decisions. A negative result may occur with  improper specimen collection/handling, submission of specimen other than nasopharyngeal swab, presence of viral mutation(s) within the areas targeted by this assay, and inadequate number of viral copies (<131 copies/mL). A negative result must be combined with clinical observations, patient history, and epidemiological information. The expected result is Negative.  Fact Sheet for Patients:  PinkCheek.be  Fact Sheet for Healthcare Providers:   GravelBags.it  This test is no t yet approved or cleared by the [place] FDA and  has been authorized for detection and/or diagnosis of SARS-CoV-2 by FDA under an Emergency Use Authorization (EUA). This EUA will remain  in effect (meaning this test can be used) for the duration of the COVID-19 declaration under Section 564(b)(1) of the Act, 21 U.S.C. section 360bbb-3(b)(1), unless the authorization is terminated or revoked sooner.     Influenza A by PCR NEGATIVE NEGATIVE Final   Influenza B by PCR NEGATIVE NEGATIVE Final    Comment: (NOTE) The Xpert Xpress SARS-CoV-2/FLU/RSV assay is intended as an aid in  the diagnosis of influenza from Nasopharyngeal swab specimens and  should not be used as a sole basis for treatment. Nasal washings and  aspirates are unacceptable for Xpert Xpress SARS-CoV-2/FLU/RSV  testing.  Fact Sheet for Patients: PinkCheek.be  Fact Sheet for Healthcare Providers: GravelBags.it  This test is not yet approved or cleared by the [place] FDA and  has been authorized for detection and/or diagnosis of SARS-CoV-2 by  FDA under an Emergency Use Authorization (EUA). This EUA will remain  in effect (meaning this test can be used) for the duration of the  Covid-19 declaration under Section 564(b)(1) of the Act, 21  U.S.C. section 360bbb-3(b)(1), unless the authorization is  terminated or revoked. Performed at [facility], [address], [place], [place] [address]   Urine culture     Status: None   Collection Time: 03/23/20  4:22 PM   Specimen: Urine, Random  Result Value Ref Range Status   Specimen Description   Final    URINE, RANDOM Performed at [facility], [address] [place], [place] [address]    Special Requests   Final    NONE Performed at [facility], [address], [place], [place] [address]    Culture   Final    NO GROWTH Performed at [facility], [place] [address], [place], [place] [address]    Report Status 03/24/2020 FINAL  Final     Radiology Studies: CT Chest Wo Contrast  Result Date: 03/23/2020 CLINICAL DATA:  Respiratory failure. Shortness of breath. Lower abdominal pain. Symptoms for 3 weeks. EXAM: CT CHEST WITHOUT CONTRAST TECHNIQUE: Multidetector CT imaging of the chest was performed following the standard protocol without IV contrast. COMPARISON:  Plain film earlier today. No prior chest CT. A CT of the abdomen of 01/22/2020 is reviewed. FINDINGS: Cardiovascular: Aortic and branch vessel atherosclerosis. Mild cardiomegaly, without pericardial effusion. Multivessel coronary artery atherosclerosis. Pulmonary artery enlargement, outflow tract 3.9 cm Mediastinum/Nodes: Multiple small middle mediastinal nodes. None pathologic by size criteria. Hilar regions poorly evaluated without intravenous contrast. Lungs/Pleura: Small, right larger than left pleural effusions. Minimal motion degradation.  Right lower lobe dependent atelectasis. Areas of minimal posterior right upper lobe dependent atelectasis. Mild patchy ground-glass opacity with areas of mild smooth septal thickening. Scattered calcified and noncalcified pulmonary nodules. Noncalcified nodules including within the right upper lobe at 3 mm on 57/4, 3 mm on 61/4. Upper Abdomen: Moderate to marked cirrhosis. Cholecystectomy. Normal imaged portions of the spleen, stomach, pancreas, right adrenal gland, kidneys. 2.9 cm low-density left adrenal nodule, technically indeterminate based on density measurements today, but on the order of 7-9 HU on 01/22/2020, most consistent with an adenoma. Musculoskeletal: Cervical and thoracic spondylosis. IMPRESSION: 1. Constellation of findings, including bilateral pleural effusions, smooth septal thickening, and scattered mild ground-glass, which are most consistent with fluid overload, likely secondary to congestive heart failure. 2. Tiny pulmonary  nodules as detailed above. No follow-up needed if patient is low-risk. Non-contrast chest CT can be considered in 12 months if patient is high-risk. This recommendation follows the consensus statement: Guidelines for Management of Incidental Pulmonary Nodules Detected on CT Images: From the Fleischner Society 2017; Radiology 2017; 284:228-243. 3. Cirrhosis. 4. Left adrenal adenoma. 5. Coronary artery atherosclerosis. Aortic Atherosclerosis (ICD10-I70.0). 6. Pulmonary artery enlargement suggests pulmonary arterial hypertension. Electronically Signed   By: Abigail Miyamoto M.D.   On: 03/23/2020 17:49    Scheduled Meds: . amLODipine  10 mg Oral Daily  . ciprofloxacin  500 mg Oral QHS  . furosemide  40 mg Intravenous BID  . heparin  5,000 Units Subcutaneous Q8H  . [START ON 03/26/2020] influenza vaccine adjuvanted  0.5 mL Intramuscular Tomorrow-1000  . insulin aspart  0-9 Units Subcutaneous TID WC  . magnesium oxide  200 mg Oral Daily  . metoprolol succinate  100 mg Oral QPM  . pravastatin  80 mg Oral QPM  . vitamin B-12  1,000 mcg Oral Daily   Continuous Infusions:    LOS: 2 days    Time spent: 30 minutes    Barton Dubois, MD [facility]   To contact the attending provider between 7A-7P or the covering provider during after hours 7P-7A, please log into the web site [email or web address] and access using universal [place] password for that web site. If you do not have the password, please call the hospital operator.  03/25/2020, 3:38 PM

## 2020-03-25 NOTE — Plan of Care (Signed)
  Problem: Education: Goal: Knowledge of General Education information will improve Description: Including pain rating scale, medication(s)/side effects and non-pharmacologic comfort measures Outcome: Progressing   Problem: Clinical Measurements: Goal: Cardiovascular complication will be avoided Outcome: Progressing   Problem: Clinical Measurements: Goal: Respiratory complications will improve Outcome: Progressing   Problem: Elimination: Goal: Will not experience complications related to bowel motility Outcome: Progressing   Problem: Coping: Goal: Level of anxiety will decrease Outcome: Progressing   Problem: Nutrition: Goal: Adequate nutrition will be maintained Outcome: Progressing   Problem: Skin Integrity: Goal: Risk for impaired skin integrity will decrease Outcome: Progressing   Problem: Safety: Goal: Ability to remain free from injury will improve Outcome: Progressing   Problem: Pain Managment: Goal: General experience of comfort will improve Outcome: Progressing

## 2020-03-25 NOTE — Consult Note (Addendum)
[facility] Nurse Consult Note: Reason for Consult: Consult requested for right ankle wound.  Performed remotely after review of progress notes and photos in the EMR.  Wound type: Chronic stage 4 pressure injury Pressure Injury POA: Yes Wound bed: Wound is 90% red, 10% eschar to wound edges, dry crusted callous areas to wound edges Dressing procedure/placement/frequency: Topical treatment orders provided for bedside nurses to perform daily as follows to promote healing and provide antimicrobial benifits: Cleanse the right ankle with NS Q day, then pat dry.  Apply a piece of Aquacel Kellie Simmering # [phone]) over wound and cover with foam dressing.  (Change the foam dressing Q 3 days or PRN soiling.)  Moisten previous dressing with NS to assist with removal. Please re-consult if further assistance is needed.  Thank-you,  Julien Girt MSN, [place], [place], [place], [place]

## 2020-03-25 NOTE — Consult Note (Signed)
WOC Nurse Consult Note: Consult requested for right ankle.  Secure chat message sent to the primary team; " No one on our team is available on the [place] today; pleae enter a photo into the EMR if possible and I will review and perform a remote consult for topical treatment of the right ankle wound."  [phone] MSN, RN, [place], [place], [place]

## 2020-03-26 LAB — BASIC METABOLIC PANEL
Anion gap: 7 (ref 5–15)
BUN: 68 mg/dL — ABNORMAL HIGH (ref 8–23)
CO2: 24 mmol/L (ref 22–32)
Calcium: 8.5 mg/dL — ABNORMAL LOW (ref 8.9–10.3)
Chloride: 106 mmol/L (ref 98–111)
Creatinine, Ser: 4.15 mg/dL — ABNORMAL HIGH (ref 0.61–1.24)
GFR calc Af Amer: 16 mL/min — ABNORMAL LOW (ref >60)
GFR calc non Af Amer: 14 mL/min — ABNORMAL LOW (ref >60)
Glucose, Bld: 151 mg/dL — ABNORMAL HIGH (ref 70–99)
Potassium: 4.3 mmol/L (ref 3.5–5.1)
Sodium: 137 mmol/L (ref 135–145)

## 2020-03-26 LAB — GLUCOSE, CAPILLARY
Glucose-Capillary: 180 mg/dL — ABNORMAL HIGH (ref 70–99)
Glucose-Capillary: 205 mg/dL — ABNORMAL HIGH (ref 70–99)
Glucose-Capillary: 217 mg/dL — ABNORMAL HIGH (ref 70–99)
Glucose-Capillary: 198 mg/dL — ABNORMAL HIGH (ref 70–99)

## 2020-03-26 NOTE — Progress Notes (Signed)
PROGRESS NOTE    Epifanio Labrador  TSV:[id number] DOB: 11/22/1955 DOA: 03/23/2020 PCP: Celene Squibb, MD    Chief Complaint  Patient presents with  . Shortness of Breath    Brief Narrative:  As per H&P written by Dr. Marily Memos on 03/23/20 Patricia Perales is a 64 y.o. male with medical history significant of CKD IV, DM, HTN, Urinary retention, BPH s/p TURP, anemia of chronic disease.   Pt complaining of intermittetn back and lower abd pain for 2 wks. Increasing in intensity and frequency. Urine has turned red and brown for the past week. Deniea dysuria, frequency, fever, myalgia. Since TURP in July pt has not needed to self catheterize. Over the past day pt endorses decreased UOP (150cc total on day of admission). Increasing LE swelling over this period of time as well. Pt also reporting intermittent cough, nasal congestion, rinorrhea, and sore throat and SOB, DOE,  that started 3 wks ago. No COVID vaccine. Denies, loss of taste or smell. Pt does endorse missing some doses of diuretic recewntly  ED Course: Pt hjypoxic on admissoin to ED to 78% on RA. No  Home O2 requirement.    Assessment & Plan: 1-acute respiratory failure with hypoxia: Secondary to acute on chronic diastolic heart failure and bronchitis. -Slowly improving and requiring less oxygen supplementation at this time -Still complaining of orthopnea and shortness of breath -Continue to follow daily weights, low-sodium diet and IV diuresis -Continue to follow strict I's and O's -Patient reports no having a full compliance with adjusted dose of diuretics as recommended by his nephrologist; it was making impossible for him to leave the house due to constant urination. -Continue supportive care and follow clinical response. -Close monitoring of renal function and electrolytes will be provided. -Continue telemetry monitoring.  2-Essential hypertension -Stable -Continue current antihypertensive regimen -Follow heart healthy  diet.  3-Diabetes mellitus (HCC) with nephropathy -A1C 7.1 -continue SSI -will follow CBG and adjust hypoglycemic regimen  4-CKD (chronic kidney disease) stage 4 -Cr and GFR essentially at his baseline (with anticipated fluctuation) -continue to closely follow Cr Trend while actively diuresing him. -maintain adequate hydration -minimize nephrotoxic agents  5-Dyslipidemia associated with type 2 diabetes mellitus ([place]) -continue statins   6-presumed UTI -will continue rocephin -follow urine culture results  7-Benign prostatic hyperplasia with urinary obstruction -status post prostate surgery -denying retention symptoms currently  8-anemia of chronic disease -no signs of overt bleeding -will follow Hgb trend -follow Hgb in am. -will give retacrit infusion.  9-obesity class 2 -Body mass index is 38.8 kg/m. -Low calorie diet, portion control and increase physical activity discussed with patient.   DVT prophylaxis: heparin  Code Status: Full Code Family Communication: no family at bedside; patient expressed he will update family himself. Disposition:   Status is: Inpatient  Dispo: The patient is from: home              Anticipated d/c is to: home               Anticipated d/c date is: 1 day.              Patient currently is not medically stable for discharge currently; still complaining of shortness of breath, complaining of orthopnea and with signs of mild fluid overload.  Patient also requiring oxygen supplementation (which is new for him).  Continue IV diuresis, dose adjusted based on his renal function.  No growth or microorganism isolated on patient's urine culture.  Will discontinue antibiotics.  Consultants:   None   Procedures:  See below for x-ray reports.   Antimicrobials:  Ceftriaxone stopped on 03/25/20.   Subjective: Reports breathing continued to improve; denies fever, chest pain, nausea, vomiting and orthopnea at this point.  Continues to  have some dyspnea on exertion and is requiring 1 L nasal cannula supplementation.  Objective: Vitals:   03/25/20 0300 03/25/20 1501 03/25/20 2100 03/26/20 0500  BP: (!) 154/67 (!) 145/66 (!) 157/71 (!) 162/75  Pulse: 63 (!) 58 67 68  Resp: 18 18 20 20   Temp: 98.1 F (36.7 C) 97.9 F (36.6 C) 98.6 F (37 C) 98.6 F (37 C)  TempSrc: Oral Oral  Oral  SpO2: 97% 97% 93% 90%  Weight: 133.4 kg     Height:        Intake/Output Summary (Last 24 hours) at 03/26/2020 1227 Last data filed at 03/26/2020 0900 Gross per 24 hour  Intake 240 ml  Output 850 ml  Net -610 ml   Filed Weights   03/23/20 1046 03/25/20 0300  Weight: 131.5 kg 133.4 kg    Examination: General exam: Alert, awake, oriented x 3; patient reports breathing continued to improve; denies orthopnea but is still short of breath with activity and using 1 L nasal cannula supplementation.  No fever, no dysuria, no hematuria, no chest pain or abdominal discomfort. Respiratory system: Decreased breath sounds at the bases, no wheezing, no using accessory muscles; 1 L nasal cannula supplementation in place. Cardiovascular system:RRR. No rubs or gallops. Unable to properly assess JVD with body habitus. Gastrointestinal system: Abdomen is obese, nondistended, soft and nontender. No organomegaly or masses felt. Normal bowel sounds heard. Central nervous system: Alert and oriented. No focal neurological deficits. Extremities: No cyanosis or clubbing; trace edema bilaterally. Skin: No rashes, no petechiae; stage IV chronic wound on his right ankle with clean dressings in place; no signs of superimposed infection.  (This wound was present on admission). Psychiatry: Judgement and insight appear normal. Mood & affect appropriate.    Data Reviewed: I have personally reviewed following labs and imaging studies  CBC: Recent Labs  Lab 03/23/20 1205 03/24/20 0550  WBC 9.0 8.7  NEUTROABS 7.6  --   HGB 8.5* 8.2*  HCT 27.4* 26.4*  MCV 89.5  90.1  PLT 194 409    Basic Metabolic Panel: Recent Labs  Lab 03/23/20 1205 03/24/20 0550 03/25/20 0623 03/26/20 0322  NA 137 136 136 137  K 4.7 4.6 4.2 4.3  CL 106 105 105 106  CO2 23 22 23 24   GLUCOSE 275* 208* 159* 151*  BUN 62* 64* 67* 68*  CREATININE 3.91* 3.94* 4.21* 4.15*  CALCIUM 8.8* 8.7* 8.4* 8.5*  MG  --  2.4  --   --   PHOS  --  5.2*  --   --     GFR: Estimated Creatinine Clearance: 25.1 mL/min (A) (by C-G formula based on SCr of 4.15 mg/dL (H)).  Liver Function Tests: Recent Labs  Lab 03/23/20 1205 03/24/20 0550  AST 22 19  ALT 43 36  ALKPHOS 113 102  BILITOT 1.5* 1.2  PROT 7.4 7.2  ALBUMIN 3.3* 3.1*    CBG: Recent Labs  Lab 03/25/20 1141 03/25/20 1627 03/25/20 2201 03/26/20 0753 03/26/20 1124  GLUCAP 234* 177* 176* 180* 205*     Recent Results (from the past 240 hour(s))  Respiratory Panel by RT PCR (Flu A&B, Covid) - Nasopharyngeal Swab     Status: None   Collection Time: 03/23/20  2:25 PM   Specimen: Nasopharyngeal Swab  Result Value Ref Range Status   SARS Coronavirus 2 by RT PCR NEGATIVE NEGATIVE Final    Comment: (NOTE) SARS-CoV-2 target nucleic acids are NOT DETECTED.  The SARS-CoV-2 RNA is generally detectable in upper respiratoy specimens during the acute phase of infection. The lowest concentration of SARS-CoV-2 viral copies this assay can detect is 131 copies/mL. A negative result does not preclude SARS-Cov-2 infection and should not be used as the sole basis for treatment or other patient management decisions. A negative result may occur with  improper specimen collection/handling, submission of specimen other than nasopharyngeal swab, presence of viral mutation(s) within the areas targeted by this assay, and inadequate number of viral copies (<131 copies/mL). A negative result must be combined with clinical observations, patient history, and epidemiological information. The expected result is Negative.  Fact Sheet for  Patients:  PinkCheek.be  Fact Sheet for Healthcare Providers:  GravelBags.it  This test is no t yet approved or cleared by the [place] FDA and  has been authorized for detection and/or diagnosis of SARS-CoV-2 by FDA under an Emergency Use Authorization (EUA). This EUA will remain  in effect (meaning this test can be used) for the duration of the COVID-19 declaration under Section 564(b)(1) of the Act, 21 U.S.C. section 360bbb-3(b)(1), unless the authorization is terminated or revoked sooner.     Influenza A by PCR NEGATIVE NEGATIVE Final   Influenza B by PCR NEGATIVE NEGATIVE Final    Comment: (NOTE) The Xpert Xpress SARS-CoV-2/FLU/RSV assay is intended as an aid in  the diagnosis of influenza from Nasopharyngeal swab specimens and  should not be used as a sole basis for treatment. Nasal washings and  aspirates are unacceptable for Xpert Xpress SARS-CoV-2/FLU/RSV  testing.  Fact Sheet for Patients: PinkCheek.be  Fact Sheet for Healthcare Providers: GravelBags.it  This test is not yet approved or cleared by the [place] FDA and  has been authorized for detection and/or diagnosis of SARS-CoV-2 by  FDA under an Emergency Use Authorization (EUA). This EUA will remain  in effect (meaning this test can be used) for the duration of the  Covid-19 declaration under Section 564(b)(1) of the Act, 21  U.S.C. section 360bbb-3(b)(1), unless the authorization is  terminated or revoked. Performed at [facility], [address], [place], [place] [address]   Urine culture     Status: None   Collection Time: 03/23/20  4:22 PM   Specimen: Urine, Random  Result Value Ref Range Status   Specimen Description   Final    URINE, RANDOM Performed at [facility], [address], [place], [place] [address]    Special Requests   Final    NONE Performed at [facility], [address], [place], [place] [address]    Culture   Final    NO GROWTH Performed at [facility], [place] [address], [place], [place] [address]    Report Status 03/24/2020 FINAL  Final     Radiology Studies: No results found.  Scheduled Meds: . amLODipine  7.5 mg Oral Daily  . furosemide  40 mg Intravenous Daily  . heparin  5,000 Units Subcutaneous Q8H  . insulin aspart  0-9 Units Subcutaneous TID WC  . magnesium oxide  200 mg Oral Daily  . metoprolol succinate  100 mg Oral QPM  . pravastatin  80 mg Oral QPM  . vitamin B-12  1,000 mcg Oral Daily   Continuous Infusions:    LOS:  3 days    Time spent: 30 minutes    [place] Dubois, MD [facility]   To contact the attending provider between 7A-7P or the covering provider during after hours 7P-7A, please log into the web site [email or web address] and access using universal [place] password for that web site. If you do not have the password, please call the hospital operator.  03/26/2020, 12:27 PM

## 2020-03-26 NOTE — Care Management Important Message (Signed)
Important Message  Patient Details  Name: Danny [name] MRN: [id number] Date of Birth: Mar 21, 1956   Medicare Important Message Given:  Yes     [name] 03/26/2020, 1:22 PM

## 2020-03-27 LAB — BASIC METABOLIC PANEL
Anion gap: 8 (ref 5–15)
BUN: 68 mg/dL — ABNORMAL HIGH (ref 8–23)
CO2: 25 mmol/L (ref 22–32)
Calcium: 8.4 mg/dL — ABNORMAL LOW (ref 8.9–10.3)
Chloride: 105 mmol/L (ref 98–111)
Creatinine, Ser: 4.09 mg/dL — ABNORMAL HIGH (ref 0.61–1.24)
GFR calc Af Amer: 16 mL/min — ABNORMAL LOW (ref >60)
GFR calc non Af Amer: 14 mL/min — ABNORMAL LOW (ref >60)
Glucose, Bld: 156 mg/dL — ABNORMAL HIGH (ref 70–99)
Potassium: 4.4 mmol/L (ref 3.5–5.1)
Sodium: 138 mmol/L (ref 135–145)

## 2020-03-27 LAB — GLUCOSE, CAPILLARY
Glucose-Capillary: 151 mg/dL — ABNORMAL HIGH (ref 70–99)
Glucose-Capillary: 270 mg/dL — ABNORMAL HIGH (ref 70–99)
Glucose-Capillary: 235 mg/dL — ABNORMAL HIGH (ref 70–99)
Glucose-Capillary: 191 mg/dL — ABNORMAL HIGH (ref 70–99)

## 2020-03-27 NOTE — Progress Notes (Signed)
PROGRESS NOTE    Danny [name]  VOJ:[id number] DOB: [birth year] DOA: 03/23/2020 PCP: Celene Squibb, MD    Chief Complaint  Patient presents with  . Shortness of Breath    Brief Narrative:  As per H&P written by Dr. Marily Memos on 03/23/20 Danny [name] is a 64 y.o. male with medical history significant of CKD IV, DM, HTN, Urinary retention, BPH s/p TURP, anemia of chronic disease.   Pt complaining of intermittetn back and lower abd pain for 2 wks. Increasing in intensity and frequency. Urine has turned red and brown for the past week. Deniea dysuria, frequency, fever, myalgia. Since TURP in July pt has not needed to self catheterize. Over the past day pt endorses decreased UOP (150cc total on day of admission). Increasing LE swelling over this period of time as well. Pt also reporting intermittent cough, nasal congestion, rinorrhea, and sore throat and SOB, DOE,  that started 3 wks ago. No COVID vaccine. Denies, loss of taste or smell. Pt does endorse missing some doses of diuretic recewntly  ED Course: Pt hjypoxic on admissoin to ED to 78% on RA. No  Home O2 requirement.    Assessment & Plan: 1-acute respiratory failure with hypoxia: Secondary to acute on chronic diastolic heart failure and bronchitis. -Slowly improving and requiring less oxygen supplementation at this time -Still complaining of orthopnea and shortness of breath -Continue to follow daily weights, low-sodium diet and IV diuresis -Continue to follow strict I's and O's -Patient reports no having a full compliance with adjusted dose of diuretics as recommended by his nephrologist; it was making impossible for him to leave the house due to constant urination. -Will assess for home O2 desaturation screening -Physical therapy evaluation to determine safest pathway at time of discharge. -Continue supportive care and follow clinical response. -Close monitoring of renal function and electrolytes will be provided. -Continue telemetry  monitoring.  2-Essential hypertension -Stable -Continue current antihypertensive regimen -Follow heart healthy diet.  3-Diabetes mellitus (HCC) with nephropathy -A1C 7.1 -continue SSI -will follow CBG and adjust hypoglycemic regimen  4-CKD (chronic kidney disease) stage 4 -Cr and GFR essentially at his baseline (with anticipated fluctuation) -continue to closely follow Cr Trend while actively diuresing him. -maintain adequate hydration -minimize nephrotoxic agents  5-Dyslipidemia associated with type 2 diabetes mellitus ([place]) -continue statins   6-presumed UTI -will continue rocephin -follow urine culture results  7-Benign prostatic hyperplasia with urinary obstruction -status post prostate surgery -denying retention symptoms currently  8-anemia of chronic disease -no signs of overt bleeding -will follow Hgb trend -follow Hgb in am. -will give retacrit infusion.  9-obesity class 2 -Body mass index is 38.02 kg/m. -Low calorie diet, portion control and increase physical activity discussed with patient.   DVT prophylaxis: heparin  Code Status: Full Code Family Communication: no family at bedside; patient expressed he will update family himself. Disposition:   Status is: Inpatient  Dispo: The patient is from: home              Anticipated d/c is to: home               Anticipated d/c date is: 1 day.              Patient currently is not medically stable for discharge currently; still complaining of shortness of breath, complaining of orthopnea and with signs of mild fluid overload.  Patient also requiring oxygen supplementation (which is new for him).  Continue IV diuresis, dose adjusted based on his renal function.  No growth or microorganism isolated on patient's urine culture.  Will ask for physical therapy evaluation and assess for home O2 desaturation screening.     Consultants:   None   Procedures:  See below for x-ray reports.   Antimicrobials:   Ceftriaxone stopped on 03/25/20.   Subjective: Still short of breath with activity; using 1 L nasal cannula supplementation in place.  Reports no frank orthopnea.  Feeling weak and deconditioned.  Objective: Vitals:   03/26/20 1415 03/26/20 2132 03/27/20 0553 03/27/20 0655  BP: (!) 146/57 (!) 157/73 (!) 155/75   Pulse: 73 71 63   Resp:  19 18   Temp: 98.4 F (36.9 C) 98.6 F (37 C) (!) 97.4 F (36.3 C)   TempSrc:  Oral Oral   SpO2: 93% 97% 95%   Weight:    130.7 kg  Height:        Intake/Output Summary (Last 24 hours) at 03/27/2020 1557 Last data filed at 03/27/2020 1300 Gross per 24 hour  Intake 680 ml  Output 1300 ml  Net -620 ml   Filed Weights   03/23/20 1046 03/25/20 0300 03/27/20 0655  Weight: 131.5 kg 133.4 kg 130.7 kg    Examination: General exam: Alert, awake, oriented x 3, on examination he still with 1 L of oxygen in place.  Reports feeling short of breath with activity and feeling weak and deconditioned.  No frank orthopnea reported. Respiratory system: Decreased breath sounds at the bases, no chest pain, no nausea, no vomiting. Cardiovascular system: Unable to properly assess JVD with body habitus; no rubs, no gallops, RRR. Gastrointestinal system: Abdomen is obese, nondistended, soft and nontender. No organomegaly or masses felt. Normal bowel sounds heard. Central nervous system: Alert and oriented. No focal neurological deficits. Extremities: No cyanosis or clubbing.  Trace edema appreciated bilaterally. Skin: No rashes, no petechiae.  Stage IV chronic wound on his right ankle with no signs of superimposed infection appreciated.  Clean dressings in place. Psychiatry: Judgement and insight appear normal. Mood & affect appropriate.    Data Reviewed: I have personally reviewed following labs and imaging studies  CBC: Recent Labs  Lab 03/23/20 1205 03/24/20 0550  WBC 9.0 8.7  NEUTROABS 7.6  --   HGB 8.5* 8.2*  HCT 27.4* 26.4*  MCV 89.5 90.1  PLT 194  270    Basic Metabolic Panel: Recent Labs  Lab 03/23/20 1205 03/24/20 0550 03/25/20 0623 03/26/20 0322 03/27/20 0658  NA 137 136 136 137 138  K 4.7 4.6 4.2 4.3 4.4  CL 106 105 105 106 105  CO2 23 22 23 24 25   GLUCOSE 275* 208* 159* 151* 156*  BUN 62* 64* 67* 68* 68*  CREATININE 3.91* 3.94* 4.21* 4.15* 4.09*  CALCIUM 8.8* 8.7* 8.4* 8.5* 8.4*  MG  --  2.4  --   --   --   PHOS  --  5.2*  --   --   --     GFR: Estimated Creatinine Clearance: 25.2 mL/min (A) (by C-G formula based on SCr of 4.09 mg/dL (H)).  Liver Function Tests: Recent Labs  Lab 03/23/20 1205 03/24/20 0550  AST 22 19  ALT 43 36  ALKPHOS 113 102  BILITOT 1.5* 1.2  PROT 7.4 7.2  ALBUMIN 3.3* 3.1*    CBG: Recent Labs  Lab 03/26/20 1124 03/26/20 1631 03/26/20 2149 03/27/20 0744 03/27/20 1128  GLUCAP 205* 217* 198* 151* 270*     Recent Results (from the past 240 hour(s))  Respiratory Panel  by RT PCR (Flu A&B, Covid) - Nasopharyngeal Swab     Status: None   Collection Time: 03/23/20  2:25 PM   Specimen: Nasopharyngeal Swab  Result Value Ref Range Status   SARS Coronavirus 2 by RT PCR NEGATIVE NEGATIVE Final    Comment: (NOTE) SARS-CoV-2 target nucleic acids are NOT DETECTED.  The SARS-CoV-2 RNA is generally detectable in upper respiratoy specimens during the acute phase of infection. The lowest concentration of SARS-CoV-2 viral copies this assay can detect is 131 copies/mL. A negative result does not preclude SARS-Cov-2 infection and should not be used as the sole basis for treatment or other patient management decisions. A negative result may occur with  improper specimen collection/handling, submission of specimen other than nasopharyngeal swab, presence of viral mutation(s) within the areas targeted by this assay, and inadequate number of viral copies (<131 copies/mL). A negative result must be combined with clinical observations, patient history, and epidemiological information.  The expected result is Negative.  Fact Sheet for Patients:  PinkCheek.be  Fact Sheet for Healthcare Providers:  GravelBags.it  This test is no t yet approved or cleared by the [place] FDA and  has been authorized for detection and/or diagnosis of SARS-CoV-2 by FDA under an Emergency Use Authorization (EUA). This EUA will remain  in effect (meaning this test can be used) for the duration of the COVID-19 declaration under Section 564(b)(1) of the Act, 21 U.S.C. section 360bbb-3(b)(1), unless the authorization is terminated or revoked sooner.     Influenza A by PCR NEGATIVE NEGATIVE Final   Influenza B by PCR NEGATIVE NEGATIVE Final    Comment: (NOTE) The Xpert Xpress SARS-CoV-2/FLU/RSV assay is intended as an aid in  the diagnosis of influenza from Nasopharyngeal swab specimens and  should not be used as a sole basis for treatment. Nasal washings and  aspirates are unacceptable for Xpert Xpress SARS-CoV-2/FLU/RSV  testing.  Fact Sheet for Patients: PinkCheek.be  Fact Sheet for Healthcare Providers: GravelBags.it  This test is not yet approved or cleared by the [place] FDA and  has been authorized for detection and/or diagnosis of SARS-CoV-2 by  FDA under an Emergency Use Authorization (EUA). This EUA will remain  in effect (meaning this test can be used) for the duration of the  Covid-19 declaration under Section 564(b)(1) of the Act, 21  U.S.C. section 360bbb-3(b)(1), unless the authorization is  terminated or revoked. Performed at [facility], [address], [place], [place] [address]   Urine culture     Status: None   Collection Time: 03/23/20  4:22 PM   Specimen: Urine, Random  Result Value Ref Range Status   Specimen Description   Final    URINE, RANDOM Performed at [facility], [address] [place], [place] [address]    Special  Requests   Final    NONE Performed at [facility], [address], [place], [place] [address]    Culture   Final    NO GROWTH Performed at [facility], [place] [address], [place],  [address]    Report Status 03/24/2020 FINAL  Final     Radiology Studies: No results found.  Scheduled Meds: . amLODipine  7.5 mg Oral Daily  . furosemide  40 mg Intravenous Daily  . heparin  5,000 Units Subcutaneous Q8H  . insulin aspart  0-9 Units Subcutaneous TID WC  . magnesium oxide  200 mg Oral Daily  . metoprolol succinate  100 mg Oral QPM  . pravastatin  80 mg Oral QPM  . vitamin B-12  1,000 mcg Oral Daily   Continuous Infusions:    LOS: 4 days    Time spent: 30 minutes    [place] Dubois, MD [facility]   To contact the attending provider between 7A-7P or the covering provider during after hours 7P-7A, please log into the web site [email or web address] and access using universal [place] password for that web site. If you do not have the password, please call the hospital operator.  03/27/2020, 3:57 PM

## 2020-03-28 LAB — BASIC METABOLIC PANEL
Anion gap: 10 (ref 5–15)
BUN: 69 mg/dL — ABNORMAL HIGH (ref 8–23)
CO2: 25 mmol/L (ref 22–32)
Calcium: 8.6 mg/dL — ABNORMAL LOW (ref 8.9–10.3)
Chloride: 102 mmol/L (ref 98–111)
Creatinine, Ser: 4.01 mg/dL — ABNORMAL HIGH (ref 0.61–1.24)
GFR calc Af Amer: 17 mL/min — ABNORMAL LOW (ref >60)
GFR calc non Af Amer: 15 mL/min — ABNORMAL LOW (ref >60)
Glucose, Bld: 225 mg/dL — ABNORMAL HIGH (ref 70–99)
Potassium: 4.4 mmol/L (ref 3.5–5.1)
Sodium: 137 mmol/L (ref 135–145)

## 2020-03-28 LAB — GLUCOSE, CAPILLARY
Glucose-Capillary: 204 mg/dL — ABNORMAL HIGH (ref 70–99)
Glucose-Capillary: 233 mg/dL — ABNORMAL HIGH (ref 70–99)
Glucose-Capillary: 198 mg/dL — ABNORMAL HIGH (ref 70–99)
Glucose-Capillary: 212 mg/dL — ABNORMAL HIGH (ref 70–99)

## 2020-03-28 NOTE — Progress Notes (Signed)
PROGRESS NOTE    Danny [name]  ZOX:[id number] DOB: 12-31-1955 DOA: 03/23/2020 PCP: Celene Squibb, MD    Chief Complaint  Patient presents with  . Shortness of Breath    Brief Narrative:  As per H&P written by Dr. Marily Memos on 03/23/20 Danny [name] is a 64 y.o. male with medical history significant of CKD IV, DM, HTN, Urinary retention, BPH s/p TURP, anemia of chronic disease.   Pt complaining of intermittetn back and lower abd pain for 2 wks. Increasing in intensity and frequency. Urine has turned red and brown for the past week. Deniea dysuria, frequency, fever, myalgia. Since TURP in July pt has not needed to self catheterize. Over the past day pt endorses decreased UOP (150cc total on day of admission). Increasing LE swelling over this period of time as well. Pt also reporting intermittent cough, nasal congestion, rinorrhea, and sore throat and SOB, DOE,  that started 3 wks ago. No COVID vaccine. Denies, loss of taste or smell. Pt does endorse missing some doses of diuretic recewntly  ED Course: Pt hjypoxic on admissoin to ED to 78% on RA. No  Home O2 requirement.    Assessment & Plan: 1-acute respiratory failure with hypoxia: Secondary to acute on chronic diastolic heart failure and bronchitis. -Slowly improving and requiring less oxygen supplementation at this time -Still complaining of orthopnea and shortness of breath -Continue to follow daily weights, low-sodium diet and IV diuresis -Continue to follow strict I's and O's -Patient reports no having a full compliance with adjusted dose of diuretics as recommended by his nephrologist; it was making impossible for him to leave the house due to constant urination. -Will assess for home O2 desaturation screening -Physical therapy evaluation to determine safest pathway at time of discharge. -Continue supportive care and follow clinical response. -Continue closely monitoring renal function and electrolytes while providing  diuresis. -Continue telemetry monitoring.  2-Essential hypertension -Stable -Continue current antihypertensive regimen -Follow heart healthy diet.  3-Diabetes mellitus ([place]) with nephropathy -A1C 7.1 -continue SSI -will follow CBG and adjust hypoglycemic regimen as required  4-CKD (chronic kidney disease) stage 4 -Cr and GFR essentially at his baseline (with anticipated fluctuation) -continue to closely follow Cr Trend while actively diuresing him. -maintain adequate hydration -Continue to minimize nephrotoxic agents -Creatinine 4.01 currently.  5-Dyslipidemia associated with type 2 diabetes mellitus ([place]) -continue statins   6-presumed UTI -will continue rocephin -follow urine culture results  7-Benign prostatic hyperplasia with urinary obstruction -status post prostate surgery -denying retention symptoms currently  8-anemia of chronic disease -no signs of overt bleeding -will follow Hgb trend -follow Hgb in am. -will give retacrit infusion.  9-obesity class 2 -Body mass index is 38.02 kg/m. -Low calorie diet, portion control and increase physical activity discussed with patient.   DVT prophylaxis: heparin  Code Status: Full Code Family Communication: no family at bedside; patient expressed he will update family himself. Disposition:   Status is: Inpatient  Dispo: The patient is from: home              Anticipated d/c is to: home               Anticipated d/c date is: 1 day.              Patient currently is not medically stable for discharge currently; still complaining of shortness of breath, complaining of orthopnea and with signs of mild fluid overload.  Patient also requiring oxygen supplementation (which is new for him).  Continue IV diuresis,  dose adjusted based on his renal function.  No growth or microorganism isolated on patient's urine culture.  Will ask for physical therapy evaluation and assess for home O2 desaturation screening.     Consultants:    None   Procedures:  See below for x-ray reports.   Antimicrobials:  Ceftriaxone stopped on 03/25/20.   Subjective: Patient reports shortness of breath with activity, using 1 L nasal cannula supplementation.  Weak and deconditioned; expressed interest on rehabilitation at a skilled facility.  Objective: Vitals:   03/27/20 1500 03/27/20 1658 03/27/20 2022 03/28/20 1427  BP: (!) 147/70 (!) 147/70 138/70 (!) 155/63  Pulse: 65 65 66 64  Resp: 19 19 20 18   Temp: 97.8 F (36.6 C) 97.8 F (36.6 C) 98 F (36.7 C)   TempSrc: Oral Oral Oral   SpO2: 98% 98% 95% 97%  Weight:      Height:        Intake/Output Summary (Last 24 hours) at 03/28/2020 1709 Last data filed at 03/28/2020 1528 Gross per 24 hour  Intake 720 ml  Output --  Net 720 ml   Filed Weights   03/23/20 1046 03/25/20 0300 03/27/20 0655  Weight: 131.5 kg 133.4 kg 130.7 kg    Examination: General exam: Alert, awake, oriented x 3; feeling short of breath with activity and denying chest pain.  Currently requiring 1 L nasal cannula supplementation.  Overall feels better than at time of admission. Respiratory system: Decreased breath sounds on the bases; no using accessory muscles.  No wheezing.  1 L nasal cannula supplementation in place. Cardiovascular system:RRR. No murmurs, rubs, gallops.  Unable to properly assess for JVD. Gastrointestinal system: Abdomen is obese, nondistended, soft and nontender. No organomegaly or masses felt. Normal bowel sounds heard. Central nervous system: Alert and oriented. No focal neurological deficits. Extremities: No cyanosis or clubbing; trace edema appreciated bilaterally. Skin: No rashes, no petechiae.  Right lower extremity with stage IV chronic wound on his ankle; no signs of superimposed infection appreciated. Psychiatry: Judgement and insight appear normal. Mood & affect appropriate.    Data Reviewed: I have personally reviewed following labs and imaging studies  CBC: Recent  Labs  Lab 03/23/20 1205 03/24/20 0550  WBC 9.0 8.7  NEUTROABS 7.6  --   HGB 8.5* 8.2*  HCT 27.4* 26.4*  MCV 89.5 90.1  PLT 194 294    Basic Metabolic Panel: Recent Labs  Lab 03/24/20 0550 03/25/20 0623 03/26/20 0322 03/27/20 0658 03/28/20 0622  NA 136 136 137 138 137  K 4.6 4.2 4.3 4.4 4.4  CL 105 105 106 105 102  CO2 22 23 24 25 25   GLUCOSE 208* 159* 151* 156* 225*  BUN 64* 67* 68* 68* 69*  CREATININE 3.94* 4.21* 4.15* 4.09* 4.01*  CALCIUM 8.7* 8.4* 8.5* 8.4* 8.6*  MG 2.4  --   --   --   --   PHOS 5.2*  --   --   --   --     GFR: Estimated Creatinine Clearance: 25.7 mL/min (A) (by C-G formula based on SCr of 4.01 mg/dL (H)).  Liver Function Tests: Recent Labs  Lab 03/23/20 1205 03/24/20 0550  AST 22 19  ALT 43 36  ALKPHOS 113 102  BILITOT 1.5* 1.2  PROT 7.4 7.2  ALBUMIN 3.3* 3.1*    CBG: Recent Labs  Lab 03/27/20 1608 03/27/20 2102 03/28/20 0727 03/28/20 1130 03/28/20 1638  GLUCAP 235* 191* 204* 233* 198*     Recent Results (from the  past 240 hour(s))  Respiratory Panel by RT PCR (Flu A&B, Covid) - Nasopharyngeal Swab     Status: None   Collection Time: 03/23/20  2:25 PM   Specimen: Nasopharyngeal Swab  Result Value Ref Range Status   SARS Coronavirus 2 by RT PCR NEGATIVE NEGATIVE Final    Comment: (NOTE) SARS-CoV-2 target nucleic acids are NOT DETECTED.  The SARS-CoV-2 RNA is generally detectable in upper respiratoy specimens during the acute phase of infection. The lowest concentration of SARS-CoV-2 viral copies this assay can detect is 131 copies/mL. A negative result does not preclude SARS-Cov-2 infection and should not be used as the sole basis for treatment or other patient management decisions. A negative result may occur with  improper specimen collection/handling, submission of specimen other than nasopharyngeal swab, presence of viral mutation(s) within the areas targeted by this assay, and inadequate number of viral  copies (<131 copies/mL). A negative result must be combined with clinical observations, patient history, and epidemiological information. The expected result is Negative.  Fact Sheet for Patients:  PinkCheek.be  Fact Sheet for Healthcare Providers:  GravelBags.it  This test is no t yet approved or cleared by the [place] FDA and  has been authorized for detection and/or diagnosis of SARS-CoV-2 by FDA under an Emergency Use Authorization (EUA). This EUA will remain  in effect (meaning this test can be used) for the duration of the COVID-19 declaration under Section 564(b)(1) of the Act, 21 U.S.C. section 360bbb-3(b)(1), unless the authorization is terminated or revoked sooner.     Influenza A by PCR NEGATIVE NEGATIVE Final   Influenza B by PCR NEGATIVE NEGATIVE Final    Comment: (NOTE) The Xpert Xpress SARS-CoV-2/FLU/RSV assay is intended as an aid in  the diagnosis of influenza from Nasopharyngeal swab specimens and  should not be used as a sole basis for treatment. Nasal washings and  aspirates are unacceptable for Xpert Xpress SARS-CoV-2/FLU/RSV  testing.  Fact Sheet for Patients: PinkCheek.be  Fact Sheet for Healthcare Providers: GravelBags.it  This test is not yet approved or cleared by the [place] FDA and  has been authorized for detection and/or diagnosis of SARS-CoV-2 by  FDA under an Emergency Use Authorization (EUA). This EUA will remain  in effect (meaning this test can be used) for the duration of the  Covid-19 declaration under Section 564(b)(1) of the Act, 21  U.S.C. section 360bbb-3(b)(1), unless the authorization is  terminated or revoked. Performed at [facility], [address], [place], [place] [address]   Urine culture     Status: None   Collection Time: 03/23/20  4:22 PM   Specimen: Urine, Random  Result Value Ref Range Status    Specimen Description   Final    URINE, RANDOM Performed at [facility], [address] [place], [place] [address]    Special Requests   Final    NONE Performed at [facility], [address] [place], [place] [address]    Culture   Final    NO GROWTH Performed at [facility], [place] [address], [place], [place] [address]    Report Status 03/24/2020 FINAL  Final     Radiology Studies: No results found.  Scheduled Meds: . amLODipine  7.5 mg Oral Daily  . furosemide  40 mg Intravenous Daily  . heparin  5,000 Units Subcutaneous Q8H  . insulin aspart  0-9 Units Subcutaneous TID WC  . magnesium oxide  200 mg Oral Daily  . metoprolol succinate  100 mg  Oral QPM  . pravastatin  80 mg Oral QPM  . vitamin B-12  1,000 mcg Oral Daily   Continuous Infusions:    LOS: 5 days    Time spent: 30 minutes    Barton Dubois, MD [facility]   To contact the attending provider between 7A-7P or the covering provider during after hours 7P-7A, please log into the web site [email or web address] and access using universal [place] password for that web site. If you do not have the password, please call the hospital operator.  03/28/2020, 5:09 PM

## 2020-03-28 NOTE — Evaluation (Signed)
Physical Therapy Evaluation Patient Details Name: Danny [name] MRN: [id number] DOB: 21-Apr-1956 Today's Date: 03/28/2020   History of Present Illness  Pt complaining of intermittetn back and lower abd pain for 2 wks. Increasing in intensity and frequency. Urine has turned red and brown for the past week. Deniea dysuria, frequency, fever, myalgia. Since TURP in July pt has not needed to self catheterize. Over the past day pt endorses decreased UOP (150cc total on day of admission). Increasing LE swelling over this period of time as well. Pt also reporting intermittent cough, nasal congestion, rinorrhea, and sore throat and SOB, DOE,  that started 3 wks ago. No COVID vaccine. Denies, loss of taste or smell. Pt does endorse missing some doses of diuretic recewntly  Clinical Impression  Pt fatigued after ambulating 20 ft which is not enough to get around in his apartment.  PT may benefit from SNF to Ensure safety at home as pt live alone, if this is not possible pt will need HH      Follow Up Recommendations SNF;Home health PT;Outpatient PT    Equipment Recommendations  None recommended by PT    Recommendations for Other Services   snf; HH    Precautions / Restrictions Precautions Precautions: None Restrictions Weight Bearing Restrictions: No      Mobility  Bed Mobility               General bed mobility comments: PT was sitting in chair when therapist came in; pt states he was able to get into the chair on his won  Transfers   Equipment used: Conservation officer, nature (2 wheeled)                Ambulation/Gait Ambulation/Gait assistance: Modified independent (Device/Increase time) Gait Distance (Feet): 20 Feet (pt fatigued) Assistive device: Rolling walker (2 wheeled) Gait Pattern/deviations: Decreased step length - right;Decreased step length - left     General Gait Details: Pt uses forearm on walker states this is how he does it at home.  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        Extremity/Trunk Assessment        Lower Extremity Assessment Lower Extremity Assessment: Overall WFL for tasks assessed       Communication   Communication: No difficulties  Cognition Arousal/Alertness: Awake/alert   Overall Cognitive Status: Within Functional Limits for tasks assessed                                           Exercises General Exercises - Lower Extremity Ankle Circles/Pumps: Both;10 reps;Seated Long Arc Quad: Both;10 reps;Seated Hip ABduction/ADduction: Both;10 reps;Seated Hip Flexion/Marching: Both;10 reps   Assessment/Plan    PT Assessment  Patient needs continued PT services  PT Problem List Decreased strength;Decreased activity tolerance;Obesity;Pain       PT Treatment Interventions Gait training;Therapeutic activities;Therapeutic exercise    PT Goals (Current goals can be found in the Care Plan section)  Acute Rehab PT Goals Patient Stated Goal: To be able to get up and walk again PT Goal Formulation: With patient Time For Goal Achievement: 03/31/20    Frequency Min 4X/week   Barriers to discharge    lives alone        AM-PAC PT "6 Clicks" Mobility  Outcome Measure Help needed turning from your back to your side while in a flat bed without using  bedrails?: A Little Help needed moving from lying on your back to sitting on the side of a flat bed without using bedrails?: A Little Help needed moving to and from a bed to a chair (including a wheelchair)?: None Help needed standing up from a chair using your arms (e.g., wheelchair or bedside chair)?: A Little Help needed to walk in hospital room?: A Little Help needed climbing 3-5 steps with a railing? : A Lot 6 Click Score: 18    End of Session Equipment Utilized During Treatment: Gait belt Activity Tolerance: Patient limited by fatigue Patient left: in chair Nurse Communication: Mobility status PT Visit Diagnosis: Unsteadiness on feet (R26.81);Muscle weakness (generalized) (M62.81);Difficulty in walking, not elsewhere classified (R26.2)    Time: 1110-1145 PT Time Calculation (min) (ACUTE ONLY): 35 min   Charges:   PT Evaluation $PT Eval Low Complexity: 1 Low PT Treatments $Therapeutic Exercise: 8-22 mins          Rayetta Humphrey, PT CLT [phone] 03/28/2020, 11:41 AM

## 2020-03-28 NOTE — Plan of Care (Signed)
PT to be able to ambulate x 60 ft with RW without fatigue  3 days

## 2020-03-29 LAB — GLUCOSE, CAPILLARY
Glucose-Capillary: 118 mg/dL — ABNORMAL HIGH (ref 70–99)
Glucose-Capillary: 188 mg/dL — ABNORMAL HIGH (ref 70–99)
Glucose-Capillary: 228 mg/dL — ABNORMAL HIGH (ref 70–99)

## 2020-03-29 MED ORDER — TORSEMIDE 20 MG PO TABS
40.0000 mg | ORAL_TABLET | Freq: Every day | ORAL | Status: DC
  Administered 2020-03-30 – 2020-03-31 (×2): 40 mg via ORAL
  Filled 2020-03-29 (×2): qty 2

## 2020-03-29 NOTE — NC FL2 (Signed)
[place] MEDICAID FL2 LEVEL OF CARE SCREENING TOOL     IDENTIFICATION  Patient Name: Danny [name] Birthdate: 01/27/1956 Sex: male Admission Date (Current Location): 03/23/2020  [facility] and [place] Number:  [employer] and Address:  [name], [place]      Provider Number: [id number]  Attending Physician Name and Address:  Barton Dubois, MD  Relative Name and Phone Number:       Current Level of Care: Hospital Recommended Level of Care: [name]:    Date Approved/Denied:   PASRR Number: [id number]  Discharge Plan: SNF    Current Diagnoses: Patient Active Problem List   Diagnosis Date Noted  . Respiratory failure ([place]) 03/23/2020  . UTI (urinary tract infection), bacterial 01/22/2020  . Acute on chronic anemia 01/16/2020  . Benign prostatic hyperplasia with urinary obstruction 01/15/2020  . AKI (acute kidney injury) ([place]) 07/08/2019  . Hypoxia 07/08/2019  . Pressure injury of skin 07/08/2019  . Edema of both lower extremities   . Anasarca 03/23/2019  . Bladder outlet obstruction 03/23/2019  . Yeast infection of the skin 03/16/2019  . Diabetic ulcer of left foot ([place]) 03/15/2019  . Diabetic ulcer of right ankle ([place]) 03/15/2019  . Hypertension associated with stage 3 chronic kidney disease due to type 2 diabetes mellitus ([place]) 03/09/2019  . Controlled type 2 diabetes mellitus with stage 3 chronic kidney disease, with long-term current use of insulin ([place]) 03/09/2019  . Dyslipidemia associated with type 2 diabetes mellitus ([place]) 03/09/2019  . Chronic gout due to renal impairment without tophus 03/09/2019  . Emphysematous cystitis 03/05/2019  . Bilateral hydronephrosis   . Bilateral cellulitis of lower leg 03/03/2019  . Urinary retention 03/03/2019  . Klebsiella Cystitis 03/03/2019  . UTI due to Klebsiella species 03/03/2019  . Plantar ulcer of left foot ([place]) 03/03/2019  .  Acute on chronic diastolic (congestive) heart failure ([place]) 02/25/2019  . Acute respiratory failure with hypoxia ([place]) 02/25/2019  . Acute renal failure superimposed on stage 3 chronic kidney disease ([place]) 02/24/2019  . Congestive heart failure ([place]) 02/24/2019  . Dyspnea 02/23/2019  . Essential hypertension 02/23/2019  . Diabetes mellitus ([place]) 02/23/2019  . CKD (chronic kidney disease) 02/23/2019  . Psoriasis 02/23/2019    Orientation RESPIRATION BLADDER Height & Weight     Self, Time, Place, Situation  O2 (2 L) External catheter Weight: 287 lb 14.7 oz (130.6 kg) Height:  6\' 1"  (185.4 cm)  BEHAVIORAL SYMPTOMS/MOOD NEUROLOGICAL BOWEL NUTRITION STATUS      Continent Diet (Heart healthy. See d/c summary for updates.)  AMBULATORY STATUS COMMUNICATION OF NEEDS Skin   Limited Assist Verbally Other (Comment), PU Stage and Appropriate Care (Cellulitis right ankle, blister to right groin, contact dermatitis to groin)   PU Stage 2 Dressing:  (To sacrum with foam dressing changes PRN.)   PU Stage 4 Dressing:  (To right anke with foam dressing changes every 3 days.)               Personal Care Assistance Level of Assistance  Bathing, Dressing, Feeding Bathing Assistance: Limited assistance Feeding assistance: Limited assistance Dressing Assistance: Limited assistance     Functional Limitations Info  Sight, Speech, Hearing Sight Info: Adequate Hearing Info: Adequate Speech Info: Adequate    SPECIAL CARE FACTORS FREQUENCY  PT (By licensed PT)     PT Frequency: daily              Contractures  Additional Factors Info  Code Status, Allergies, Insulin Sliding Scale Code Status Info: Full code Allergies Info: Dust mite extract, Prednisone, Rocephin (ceftriaxone)           Current Medications (03/29/2020):  This is the current hospital active medication list Current Facility-Administered Medications  Medication Dose Route Frequency Provider Last Rate Last Admin  .  albuterol (PROVENTIL) (2.5 MG/3ML) 0.083% nebulizer solution 2.5 mg  2.5 mg Nebulization Q6H PRN Waldemar Dickens, MD      . amLODipine (NORVASC) tablet 7.5 mg  7.5 mg Oral Daily Barton Dubois, MD   7.5 mg at 03/29/20 0846  . fluticasone (FLONASE) 50 MCG/ACT nasal spray 2 spray  2 spray Each Nare Daily PRN Waldemar Dickens, MD   2 spray at 03/24/20 2338  . furosemide (LASIX) injection 40 mg  40 mg Intravenous Daily Barton Dubois, MD   40 mg at 03/29/20 0845  . heparin injection 5,000 Units  5,000 Units Subcutaneous Q8H Waldemar Dickens, MD   5,000 Units at 03/29/20 [phone]  . insulin aspart (novoLOG) injection 0-9 Units  0-9 Units Subcutaneous TID WC Waldemar Dickens, MD   2 Units at 03/28/20 1651  . ketotifen (ZADITOR) 0.025 % ophthalmic solution 1 drop  1 drop Both Eyes Daily PRN Waldemar Dickens, MD      . magnesium oxide (MAG-OX) tablet 200 mg  200 mg Oral Daily Barton Dubois, MD   200 mg at 03/29/20 0846  . metoprolol succinate (TOPROL-XL) 24 hr tablet 100 mg  100 mg Oral QPM Waldemar Dickens, MD   100 mg at 03/28/20 1651  . pravastatin (PRAVACHOL) tablet 80 mg  80 mg Oral QPM Waldemar Dickens, MD   80 mg at 03/28/20 1651  . traMADol (ULTRAM) tablet 50 mg  50 mg Oral Q6H PRN Waldemar Dickens, MD   50 mg at 03/26/20 2015  . vitamin B-12 (CYANOCOBALAMIN) tablet 1,000 mcg  1,000 mcg Oral Daily Waldemar Dickens, MD   1,000 mcg at 03/29/20 4801     Discharge Medications: Please see discharge summary for a list of discharge medications.  Relevant Imaging Results:  Relevant Lab Results:   Additional Information SSN: [id number]. Pt has not had COVID vaccines.  Salome Arnt, LCSW

## 2020-03-29 NOTE — TOC Progression Note (Signed)
Transition of Care [facility]) - Progression Note    Patient Details  Name: Danny [name] MRN: [id number] Date of Birth: January 25, 1956  [facility] CM/SW Contact  Salome Arnt, [place] Phone Number: 03/29/2020, 11:07 AM  Clinical Narrative:  LCSW discussed d/c plan with pt and discussed PT recommendations. Pt requesting SNF in [facility]. LCSW started SNF auth with [employer] (ref #: [id number]). Will initiate bed search.     Expected Discharge Plan: [place] Barriers to Discharge: Continued Medical Work up  Expected Discharge Plan and Services Expected Discharge Plan: [place] Choice: NA (Waiting for PT to evaluate pt.) Living arrangements for the past 2 months: Apartment                 DME Arranged: N/A DME Agency: NA       HH Arranged: NA HH Agency: NA         Social Determinants of Health (SDOH) Interventions    Readmission Risk Interventions Readmission Risk Prevention Plan 03/24/2020 07/11/2019 03/05/2019  Transportation Screening Complete Complete Complete  PCP or Specialist Appt within 3-5 Days Not Complete - Not Complete  Not Complete comments - - Going to facility and will be seen by facility medical director.  [place] or Home Care Consult Complete - Complete  Social Work Consult for Recovery Care Planning/Counseling Complete - Complete  Palliative Care Screening Not Applicable - Not Applicable  Medication Review (RN Care Manager) Complete Complete Complete  PCP or Specialist appointment within 3-5 days of discharge - Not Complete -  [place] or Home Care Consult - Complete -  SW Recovery Care/Counseling Consult - Complete -  Palliative Care Screening - Not Complete -  [place] - Complete -  Some recent data might be hidden

## 2020-03-29 NOTE — Care Management Important Message (Signed)
Important Message  Patient Details  Name: Danny [name] MRN: [id number] Date of Birth: 10-31-1955   Medicare Important Message Given:  Yes     [name] 03/29/2020, 11:48 AM

## 2020-03-29 NOTE — Progress Notes (Signed)
PROGRESS NOTE    Danny [name]  GYF:[id number] DOB: [birth year] DOA: 03/23/2020 PCP: Celene Squibb, MD    Chief Complaint  Patient presents with  . Shortness of Breath    Brief Narrative:  As per H&P written by Dr. Marily Memos on 03/23/20 Danny [name] is a 64 y.o. male with medical history significant of CKD IV, DM, HTN, Urinary retention, BPH s/p TURP, anemia of chronic disease.   Pt complaining of intermittetn back and lower abd pain for 2 wks. Increasing in intensity and frequency. Urine has turned red and brown for the past week. Deniea dysuria, frequency, fever, myalgia. Since TURP in July pt has not needed to self catheterize. Over the past day pt endorses decreased UOP (150cc total on day of admission). Increasing LE swelling over this period of time as well. Pt also reporting intermittent cough, nasal congestion, rinorrhea, and sore throat and SOB, DOE,  that started 3 wks ago. No COVID vaccine. Denies, loss of taste or smell. Pt does endorse missing some doses of diuretic recewntly  ED Course: Pt hjypoxic on admissoin to ED to 78% on RA. No  Home O2 requirement.    Assessment & Plan: 1-acute respiratory failure with hypoxia: Secondary to acute on chronic diastolic heart failure and bronchitis. -Slowly improving and requiring less oxygen supplementation at this time -Still complaining of orthopnea and shortness of breath -Continue to follow daily weights, low-sodium diet and transition to oral adjusted dose of demadex. -Continue to follow strict I's and O's -Patient reports no having a full compliance with adjusted dose of diuretics as recommended by his nephrologist; it was making impossible for him to leave the house due to constant urination. -Will continue slowly weaning off oxygen supplementation, still using 1L [place] with exertion. -Physical therapy evaluation to determine safest pathway at time of discharge. -Continue supportive care and follow clinical response. -Continue closely  monitoring renal function and electrolytes while providing diuresis. -Continue telemetry monitoring.  2-Essential hypertension -Stable -Continue current antihypertensive regimen -Follow heart healthy diet.  3-Diabetes mellitus (HCC) with nephropathy -A1C 7.1 -continue SSI -will follow CBG and adjust hypoglycemic regimen as required  4-CKD (chronic kidney disease) stage 4 -Cr and GFR essentially at his baseline (with anticipated fluctuation) -continue to closely follow Cr Trend while actively diuresing him. -maintain adequate hydration -Continue to minimize nephrotoxic agents -repeat BMET in am  5-Dyslipidemia associated with type 2 diabetes mellitus ([place]) -continue statins   6-presumed UTI -will continue rocephin -follow urine culture results  7-Benign prostatic hyperplasia with urinary obstruction -status post prostate surgery -denying retention symptoms currently  8-anemia of chronic disease -no signs of overt bleeding -will follow Hgb trend -follow Hgb in am. -received retacrit infusion. -will continue aranesp infusions as an outpatient.   9-obesity class 2 -Body mass index is 37.99 kg/m. -Low calorie diet, portion control and increase physical activity discussed with patient.   DVT prophylaxis: heparin  Code Status: Full Code Family Communication: no family at bedside; patient expressed he will update family himself. Disposition:   Status is: Inpatient  Dispo: The patient is from: home              Anticipated d/c is to: home               Anticipated d/c date is: 1 day.              Patient currently is medically stable for discharge; still complaining of shortness of mild breath with activity (but much improved); no orthopnea  and intermittently using 1L [place] supplementation. Will transition diuretics to oral regimen; continue to follow renal function and electrolytes.  Patient to be discharged to skilled nursing facility in the next 24 to 48 hours once  insurance is authorization available.       Consultants:   None   Procedures:  See below for x-ray reports.   Antimicrobials:  Ceftriaxone stopped on 03/25/20.   Subjective: Patient reports shortness of breath with activity but much better overall.  Still using 1 L nasal cannula supplementation and with main complaints of generalized weakness and deconditioning.  Objective: Vitals:   03/28/20 2145 03/29/20 0500 03/29/20 0629 03/29/20 1300  BP: (!) 161/71  (!) 147/59 (!) 155/59  Pulse: 69  64 64  Resp:      Temp: 98.7 F (37.1 C)  98.6 F (37 C) 98.6 F (37 C)  TempSrc: Oral  Oral Oral  SpO2: 96%  100% 100%  Weight:  130.6 kg    Height:        Intake/Output Summary (Last 24 hours) at 03/29/2020 1851 Last data filed at 03/29/2020 1851 Gross per 24 hour  Intake 720 ml  Output 1800 ml  Net -1080 ml   Filed Weights   03/25/20 0300 03/27/20 0655 03/29/20 0500  Weight: 133.4 kg 130.7 kg 130.6 kg    Examination: General exam: Alert, awake, oriented x 3; reports no chest pain, no nausea, no vomiting.  Still using 1 L nasal cannula supplementation mainly on exertion.  Feels that his breathing continued to improve and is just complaining of generalized weakness and deconditioning. Respiratory system: Decreased breath sounds at the bases; no frank crackles, no wheezing, no using accessory muscles. Cardiovascular system:RRR. No murmurs, rubs, gallops.  Unable to properly assess JVD with body habitus. Gastrointestinal system: Abdomen is obese, nondistended, soft and nontender. No organomegaly or masses felt. Normal bowel sounds heard. Central nervous system: Alert and oriented. No focal neurological deficits. Extremities: No cyanosis or clubbing; trace edema appreciated bilaterally. Skin: No rashes, no petechiae.  Right lower extremity with a stage IV chronic wound on his ankle; no signs of superimposed infection appreciated. Psychiatry: Judgement and insight appear normal.  Mood & affect appropriate.     Data Reviewed: I have personally reviewed following labs and imaging studies  CBC: Recent Labs  Lab 03/23/20 1205 03/24/20 0550  WBC 9.0 8.7  NEUTROABS 7.6  --   HGB 8.5* 8.2*  HCT 27.4* 26.4*  MCV 89.5 90.1  PLT 194 268    Basic Metabolic Panel: Recent Labs  Lab 03/24/20 0550 03/25/20 0623 03/26/20 0322 03/27/20 0658 03/28/20 0622  NA 136 136 137 138 137  K 4.6 4.2 4.3 4.4 4.4  CL 105 105 106 105 102  CO2 22 23 24 25 25   GLUCOSE 208* 159* 151* 156* 225*  BUN 64* 67* 68* 68* 69*  CREATININE 3.94* 4.21* 4.15* 4.09* 4.01*  CALCIUM 8.7* 8.4* 8.5* 8.4* 8.6*  MG 2.4  --   --   --   --   PHOS 5.2*  --   --   --   --     GFR: Estimated Creatinine Clearance: 25.7 mL/min (A) (by C-G formula based on SCr of 4.01 mg/dL (H)).  Liver Function Tests: Recent Labs  Lab 03/23/20 1205 03/24/20 0550  AST 22 19  ALT 43 36  ALKPHOS 113 102  BILITOT 1.5* 1.2  PROT 7.4 7.2  ALBUMIN 3.3* 3.1*    CBG: Recent Labs  Lab 03/28/20 1130  03/28/20 1638 03/28/20 2143 03/29/20 0806 03/29/20 1104  GLUCAP 233* 198* 212* 118* 188*     Recent Results (from the past 240 hour(s))  Respiratory Panel by RT PCR (Flu A&B, Covid) - Nasopharyngeal Swab     Status: None   Collection Time: 03/23/20  2:25 PM   Specimen: Nasopharyngeal Swab  Result Value Ref Range Status   SARS Coronavirus 2 by RT PCR NEGATIVE NEGATIVE Final    Comment: (NOTE) SARS-CoV-2 target nucleic acids are NOT DETECTED.  The SARS-CoV-2 RNA is generally detectable in upper respiratoy specimens during the acute phase of infection. The lowest concentration of SARS-CoV-2 viral copies this assay can detect is 131 copies/mL. A negative result does not preclude SARS-Cov-2 infection and should not be used as the sole basis for treatment or other patient management decisions. A negative result may occur with  improper specimen collection/handling, submission of specimen other than  nasopharyngeal swab, presence of viral mutation(s) within the areas targeted by this assay, and inadequate number of viral copies (<131 copies/mL). A negative result must be combined with clinical observations, patient history, and epidemiological information. The expected result is Negative.  Fact Sheet for Patients:  PinkCheek.be  Fact Sheet for Healthcare Providers:  GravelBags.it  This test is no t yet approved or cleared by the [place] FDA and  has been authorized for detection and/or diagnosis of SARS-CoV-2 by FDA under an Emergency Use Authorization (EUA). This EUA will remain  in effect (meaning this test can be used) for the duration of the COVID-19 declaration under Section 564(b)(1) of the Act, 21 U.S.C. section 360bbb-3(b)(1), unless the authorization is terminated or revoked sooner.     Influenza A by PCR NEGATIVE NEGATIVE Final   Influenza B by PCR NEGATIVE NEGATIVE Final    Comment: (NOTE) The Xpert Xpress SARS-CoV-2/FLU/RSV assay is intended as an aid in  the diagnosis of influenza from Nasopharyngeal swab specimens and  should not be used as a sole basis for treatment. Nasal washings and  aspirates are unacceptable for Xpert Xpress SARS-CoV-2/FLU/RSV  testing.  Fact Sheet for Patients: PinkCheek.be  Fact Sheet for Healthcare Providers: GravelBags.it  This test is not yet approved or cleared by the [place] FDA and  has been authorized for detection and/or diagnosis of SARS-CoV-2 by  FDA under an Emergency Use Authorization (EUA). This EUA will remain  in effect (meaning this test can be used) for the duration of the  Covid-19 declaration under Section 564(b)(1) of the Act, 21  U.S.C. section 360bbb-3(b)(1), unless the authorization is  terminated or revoked. Performed at [facility], [address], [place], [place] [address]    Urine culture     Status: None   Collection Time: 03/23/20  4:22 PM   Specimen: Urine, Random  Result Value Ref Range Status   Specimen Description   Final    URINE, RANDOM Performed at [facility], [address] [place], [place] [address]    Special Requests   Final    NONE Performed at [facility], [address] [place], [place] [address]    Culture   Final    NO GROWTH Performed at [facility], [place] [address], [place], [place] [address]    Report Status 03/24/2020 FINAL  Final     Radiology Studies: No results found.  Scheduled Meds: . amLODipine  7.5 mg Oral Daily  . heparin  5,000 Units Subcutaneous Q8H  . insulin aspart  0-9 Units Subcutaneous TID WC  .  magnesium oxide  200 mg Oral Daily  . metoprolol succinate  100 mg Oral QPM  . pravastatin  80 mg Oral QPM  . [START ON 03/30/2020] torsemide  40 mg Oral Daily  . vitamin B-12  1,000 mcg Oral Daily   Continuous Infusions:    LOS: 6 days    Time spent: 30 minutes    Barton Dubois, MD [facility]   To contact the attending provider between 7A-7P or the covering provider during after hours 7P-7A, please log into the web site [email or web address] and access using universal [place] password for that web site. If you do not have the password, please call the hospital operator.  03/29/2020, 6:51 PM

## 2020-03-29 NOTE — TOC Progression Note (Signed)
Transition of Care [facility]) - Progression Note    Patient Details  Name: Danny [name] MRN: [id number] Date of Birth: [birth year]  Transition of Care [facility]) CM/SW Contact  Salome Arnt, [place] Phone Number: 03/29/2020, 12:34 PM  Clinical Narrative: Pt accepted bed offer at [facility] received call from [facility] that pt is not managed by them. Debbie at [facility] aware and will start auth. MD updated.       Expected Discharge Plan: [place] Barriers to Discharge: Continued Medical Work up  Expected Discharge Plan and Services Expected Discharge Plan: [place] Choice: NA (Waiting for PT to evaluate pt.) Living arrangements for the past 2 months: Apartment                 DME Arranged: N/A DME Agency: NA       HH Arranged: NA HH Agency: NA         Social Determinants of Health (SDOH) Interventions    Readmission Risk Interventions Readmission Risk Prevention Plan 03/24/2020 07/11/2019 03/05/2019  Transportation Screening Complete Complete Complete  PCP or Specialist Appt within 3-5 Days Not Complete - Not Complete  Not Complete comments - - Going to facility and will be seen by facility medical director.  [place] or Home Care Consult Complete - Complete  Social Work Consult for Recovery Care Planning/Counseling Complete - Complete  Palliative Care Screening Not Applicable - Not Applicable  Medication Review (RN Care Manager) Complete Complete Complete  PCP or Specialist appointment within 3-5 days of discharge - Not Complete -  [place] or Home Care Consult - Complete -  SW Recovery Care/Counseling Consult - Complete -  Palliative Care Screening - Not Complete -  [place] - Complete -  Some recent data might be hidden

## 2020-03-30 DIAGNOSIS — L89514 Pressure ulcer of right ankle, stage 4: Secondary | ICD-10-CM

## 2020-03-30 DIAGNOSIS — E669 Obesity, unspecified: Secondary | ICD-10-CM

## 2020-03-30 LAB — GLUCOSE, CAPILLARY
Glucose-Capillary: 147 mg/dL — ABNORMAL HIGH (ref 70–99)
Glucose-Capillary: 225 mg/dL — ABNORMAL HIGH (ref 70–99)
Glucose-Capillary: 131 mg/dL — ABNORMAL HIGH (ref 70–99)

## 2020-03-30 LAB — BASIC METABOLIC PANEL
Anion gap: 8 (ref 5–15)
BUN: 64 mg/dL — ABNORMAL HIGH (ref 8–23)
CO2: 26 mmol/L (ref 22–32)
Calcium: 8.5 mg/dL — ABNORMAL LOW (ref 8.9–10.3)
Chloride: 103 mmol/L (ref 98–111)
Creatinine, Ser: 4.07 mg/dL — ABNORMAL HIGH (ref 0.61–1.24)
GFR calc Af Amer: 17 mL/min — ABNORMAL LOW (ref >60)
GFR calc non Af Amer: 14 mL/min — ABNORMAL LOW (ref >60)
Glucose, Bld: 145 mg/dL — ABNORMAL HIGH (ref 70–99)
Potassium: 4.1 mmol/L (ref 3.5–5.1)
Sodium: 137 mmol/L (ref 135–145)

## 2020-03-30 LAB — CBC
HCT: 26.2 % — ABNORMAL LOW (ref 39.0–52.0)
Hemoglobin: 8.1 g/dL — ABNORMAL LOW (ref 13.0–17.0)
MCH: 26.8 pg (ref 26.0–34.0)
MCHC: 30.9 g/dL (ref 30.0–36.0)
MCV: 86.8 fL (ref 80.0–100.0)
Platelets: 187 10*3/uL (ref 150–400)
RBC: 3.02 MIL/uL — ABNORMAL LOW (ref 4.22–5.81)
RDW: 17.9 % — ABNORMAL HIGH (ref 11.5–15.5)
WBC: 8.5 10*3/uL (ref 4.0–10.5)
nRBC: 0 % (ref 0.0–0.2)

## 2020-03-30 LAB — SARS CORONAVIRUS 2 BY RT PCR (HOSPITAL ORDER, PERFORMED IN ~~LOC~~ HOSPITAL LAB): SARS Coronavirus 2: NEGATIVE

## 2020-03-30 MED ORDER — KETOTIFEN FUMARATE 0.025 % OP SOLN: 1.0000 [drp] | Freq: Every day | OPHTHALMIC | Status: DC | PRN

## 2020-03-30 MED ORDER — TORSEMIDE 20 MG PO TABS: ORAL_TABLET | ORAL | Status: DC

## 2020-03-30 MED ORDER — IPRATROPIUM-ALBUTEROL 20-100 MCG/ACT IN AERS: 1.0000 | INHALATION_SPRAY | Freq: Four times a day (QID) | RESPIRATORY_TRACT | Status: DC | PRN

## 2020-03-30 NOTE — Discharge Summary (Signed)
Physician Discharge Summary  Danny [name] PPJ:[id number] DOB: Nov 01, 1955 DOA: 03/23/2020  PCP: Celene Squibb, MD  Admit date: 03/23/2020 Discharge date: 03/30/2020  Time spent: 35 minutes  Recommendations for Outpatient Follow-up:  1. Repeat basic metabolic panel to follow electrolytes and renal function 2. Reassess blood pressure will adjust antihypertensive regimen as needed 3. Continue to closely follow patient volume status and further adjust diuretic regimen as required 4. Continue weaning off oxygen supplementation as tolerated 5. Physical therapy rehabilitation as per the skilled nursing facility protocol.   Discharge Diagnoses:  Active Problems:   Essential hypertension   Diabetes mellitus ([place])   CKD (chronic kidney disease)   Acute on chronic diastolic (congestive) heart failure (HCC)   Acute respiratory failure with hypoxia (HCC)   Dyslipidemia associated with type 2 diabetes mellitus (HCC)   Pressure injury of skin   Edema of both lower extremities   Benign prostatic hyperplasia with urinary obstruction   Respiratory failure ([place])   Class 2 obesity   Discharge Condition: Stable and improved.  Patient discharged to [facility] for further care and rehabilitation.  Instructed to keep outpatient appointment with nephrology service and to follow-up with PCP in 10 days after discharge from nursing home.  CODE STATUS: Full code  Diet recommendation: Heart healthy, modified carbohydrate and low-sodium diet.  Filed Weights   03/27/20 0655 03/29/20 0500 03/30/20 0432  Weight: 130.7 kg 130.6 kg 127.1 kg    History of present illness:  As per H&P written by Dr. Marily Memos on 03/23/20 Danny [name] a 64 y.o.malewith medical history significant ofCKD IV, DM, HTN, Urinary retention, BPH s/p TURP, anemia of chronic disease.   Pt complaining of intermittetn back and lower abd pain for 2 wks. Increasing in intensity and frequency. Urine has turned red and brown for the  past week. Deniea dysuria, frequency, fever, myalgia. Since TURP in July pt has not needed to self catheterize. Over the past day pt endorses decreased UOP (150cc total on day of admission).Increasing LE swelling over this period of time as well.Pt also reporting intermittent cough, nasal congestion, rinorrhea, and sore throat and SOB, DOE, that started 3 wks ago. No COVID vaccine. Denies, loss of taste or smell. Pt does endorse missing some doses of diuretic recewntly  ED Course:Pt hjypoxic on admissoin to ED to 78% on RA. No Home O2 requirement.   Hospital Course:  1-acute respiratory failure with hypoxia: Secondary to acute on chronic diastolic heart failure and bronchitis. -Slowly improving and requiring less oxygen supplementation at this time -Reports no orthopnea, speaking in full sentences and just feeling slightly short of breath with activity, oxygen supplementation in place maintaining adequate saturation. -Continue to follow daily weights, low-sodium diet and  adjusted dose of diuretics at time of discharge. -Patient reports no having a full compliance with adjusted dose of diuretics as recommended by his nephrologist; as it was making impossible for him to leave the house due to constant urination. -Will continue slowly weaning off oxygen supplementation, still using 1-2 L [place] with exertion. -Physical therapy has evaluated patient and is recommending skilled nursing facility for rehabilitation and conditioning.  2-Essential hypertension -Stable and well-controlled at discharge. -Continue current antihypertensive regimen -Follow heart healthy diet.  3-Diabetes mellitus ([place]) with nephropathy -A1C 7.1 -Overall stable -Will resume home hypoglycemic regimen -Instructed to follow modified carbohydrate diet.  4-CKD (chronic kidney disease) stage 4 -Cr and GFR essentially at his baseline (with anticipated fluctuation) -Patient advised to follow renal/low-sodium  diet. -maintain adequate hydration -  Continue to minimize the use of nephrotoxic agents -repeat BMET follow-up visit to reassess renal function and stability  5-Dyslipidemia associated with type 2 diabetes mellitus ([place]) -continue statins  -Heart healthy diet has been encouraged.  6-presumed UTI at time of admission -UTI rule out -Antibiotics discontinued after 1 dose -No growth on urine cultures.  7-Benign prostatic hyperplasia with urinary obstruction -status post prostate surgery -denying retention symptoms currently -No hematuria.  8-anemia of chronic disease -no signs of overt bleeding -follow Hgb intermittently -received retacrit infusion during hospitalization. -will continue aranesp infusions as an outpatient as previously instructed.   9-obesity class 2 -Body mass index is 37.99 kg/m. -Low calorie diet, portion control and increase physical activity discussed with patient.  Procedures:  See below for x-ray reports  Consultations:  None  Discharge Exam: Vitals:   03/29/20 2119 03/30/20 0432  BP: (!) 157/68 (!) 152/73  Pulse: 66 63  Resp: 18 18  Temp: 98.7 F (37.1 C) 98.4 F (36.9 C)  SpO2: 100% 100%    General: Afebrile, no chest pain, no nausea, no vomiting.  Still requiring 1-2 L nasal supplementation (at rest and on exertion) to maintain adequate saturation.  Reports no orthopnea, speaking in full sentences and except for feeling weak and deconditioned feels ready for discharge. Cardiovascular: No fine crackles, no using accessory muscles, no wheezing.  Positive scattered rhonchi. Respiratory: S1 and S2, no rubs, no gallops, difficult to assess JVD with body habitus. Abdomen: Obese, soft, nontender, distended, positive bowel sounds Extremities/skin: No cyanosis or clubbing.  Trace edema appreciated bilaterally.  Patient with positive right ankle chronic wound on his lateral aspect, no signs of superimposed infection.  Stage IV.  Clean dressings in  place  Discharge Instructions   Discharge Instructions    (HEART FAILURE PATIENTS) Call MD:  Anytime you have any of the following symptoms: 1) 3 pound weight gain in 24 hours or 5 pounds in 1 week 2) shortness of breath, with or without a dry hacking cough 3) swelling in the hands, feet or stomach 4) if you have to sleep on extra pillows at night in order to breathe.   Complete by: As directed    Diet - low sodium heart healthy   Complete by: As directed    Discharge instructions   Complete by: As directed    Follow heart healthy, modified carbohydrate and low-sodium diet Take medication as prescribed Maintain adequate hydration Please check weight on daily basis Arrange follow-up with PCP in 10 days Follow-up with nephrology service as previously instructed.   Discharge wound care:   Complete by: As directed    Cleans the right ankle with normal saline on daily basis, then pat dry.  Apply a piece of Aquacel over the wound and cover with foam dressing.  Change wound dressings every 3 days or as needed for soiling.  Moisten previous dressing with normal saline to assist with removal.     Allergies as of 03/30/2020      Reactions   Dust Mite Extract Itching, Other (See Comments)   Unknown reaction-potential shortness of breath   Prednisone Nausea And Vomiting   Rocephin [ceftriaxone] Nausea And Vomiting      Medication List    STOP taking these medications   aspirin 325 MG tablet   mirabegron ER 25 MG Tb24 tablet Commonly known as: MYRBETRIQ   traMADol 50 MG tablet Commonly known as: Ultram     TAKE these medications   Accu-Chek SmartView test strip Generic drug:  glucose blood 1 each by Other route as needed.   acetaminophen 500 MG tablet Commonly known as: TYLENOL Take 1,000 mg by mouth every 6 (six) hours as needed for moderate pain or headache.   amLODipine 10 MG tablet Commonly known as: NORVASC Take 10 mg by mouth daily.   cholecalciferol 25 MCG (1000 UNIT)  tablet Commonly known as: VITAMIN D3 Take 1,000 Units by mouth daily.   Darbepoetin Alfa 150 MCG/0.3ML Sosy injection Commonly known as: ARANESP Inject 0.3 mLs (150 mcg total) into the skin every Saturday at 6 PM.   FISH OIL PO Take 2,400 mg by mouth 2 (two) times daily.   fluticasone 50 MCG/ACT nasal spray Commonly known as: FLONASE Place 2 sprays into both nostrils daily as needed for allergies.   Ipratropium-Albuterol 20-100 MCG/ACT Aers respimat Commonly known as: COMBIVENT Inhale 1 puff into the lungs every 6 (six) hours as needed for wheezing or shortness of breath.   ketotifen 0.025 % ophthalmic solution Commonly known as: Allergy Eye Drops Place 1 drop into both eyes daily as needed (allergies/dry eyes). What changed: medication strength   Magnesium 100 MG Caps Take 100 mg by mouth daily.   metoprolol succinate 100 MG 24 hr tablet Commonly known as: TOPROL-XL Take 1 tablet (100 mg total) by mouth every evening.   multivitamin with minerals Tabs tablet Take 1 tablet by mouth daily.   NovoLIN 70/30 ReliOn (70-30) 100 UNIT/ML injection Generic drug: insulin NPH-regular Human Inject 20-100 Units into the skin in the morning and at bedtime.   pravastatin 80 MG tablet Commonly known as: PRAVACHOL Take 1 tablet (80 mg total) by mouth every evening.   torsemide 20 MG tablet Commonly known as: DEMADEX Take 2 tablets by mouth daily; okay to use extra 20 mg daily if patient gain more than 3 pounds overnight or more than 5 pounds in a week. What changed:   how to take this  additional instructions   vitamin B-12 1000 MCG tablet Commonly known as: CYANOCOBALAMIN Take 1,000 mcg by mouth daily.   Vitamin C 500 MG Chew Chew 1,000 mg by mouth daily.            Durable Medical Equipment  (From admission, onward)         Start     Ordered   03/30/20 1244  For home use only DME oxygen  Once       Question Answer Comment  Length of Need 12 Months   Mode or  (Route) Nasal cannula   Liters per Minute 2   Frequency Continuous (stationary and portable oxygen unit needed)   Oxygen conserving device Yes   Oxygen delivery system Gas      03/30/20 1246           Discharge Care Instructions  (From admission, onward)         Start     Ordered   03/30/20 0000  Discharge wound care:       Comments: Cleans the right ankle with normal saline on daily basis, then pat dry.  Apply a piece of Aquacel over the wound and cover with foam dressing.  Change wound dressings every 3 days or as needed for soiling.  Moisten previous dressing with normal saline to assist with removal.   03/30/20 1253         Allergies  Allergen Reactions  . Dust Mite Extract Itching and Other (See Comments)    Unknown reaction-potential shortness of breath  . Prednisone  Nausea And Vomiting  . Rocephin [Ceftriaxone] Nausea And Vomiting    Contact information for follow-up providers    Celene Squibb, MD. Schedule an appointment as soon as possible for a visit in 10 day(s).   Specialty: Internal Medicine Contact information: [place] [facility] [address] [phone]        Liana Gerold, MD. Schedule an appointment as soon as possible for a visit on 04/02/2020.   Specialty: Nephrology Contact information: [address] [address] [phone]            Contact information for after-discharge care    [facility] .   Service: Skilled Nursing Contact information: [address] [place] [phone]                  The results of significant diagnostics from this hospitalization (including imaging, microbiology, ancillary and laboratory) are listed below for reference.    Significant Diagnostic Studies: CT Chest Wo Contrast  Result Date: 03/23/2020 CLINICAL DATA:  Respiratory failure. Shortness of breath. Lower abdominal pain. Symptoms  for 3 weeks. EXAM: CT CHEST WITHOUT CONTRAST TECHNIQUE: Multidetector CT imaging of the chest was performed following the standard protocol without IV contrast. COMPARISON:  Plain film earlier today. No prior chest CT. A CT of the abdomen of 01/22/2020 is reviewed. FINDINGS: Cardiovascular: Aortic and branch vessel atherosclerosis. Mild cardiomegaly, without pericardial effusion. Multivessel coronary artery atherosclerosis. Pulmonary artery enlargement, outflow tract 3.9 cm Mediastinum/Nodes: Multiple small middle mediastinal nodes. None pathologic by size criteria. Hilar regions poorly evaluated without intravenous contrast. Lungs/Pleura: Small, right larger than left pleural effusions. Minimal motion degradation. Right lower lobe dependent atelectasis. Areas of minimal posterior right upper lobe dependent atelectasis. Mild patchy ground-glass opacity with areas of mild smooth septal thickening. Scattered calcified and noncalcified pulmonary nodules. Noncalcified nodules including within the right upper lobe at 3 mm on 57/4, 3 mm on 61/4. Upper Abdomen: Moderate to marked cirrhosis. Cholecystectomy. Normal imaged portions of the spleen, stomach, pancreas, right adrenal gland, kidneys. 2.9 cm low-density left adrenal nodule, technically indeterminate based on density measurements today, but on the order of 7-9 HU on 01/22/2020, most consistent with an adenoma. Musculoskeletal: Cervical and thoracic spondylosis. IMPRESSION: 1. Constellation of findings, including bilateral pleural effusions, smooth septal thickening, and scattered mild ground-glass, which are most consistent with fluid overload, likely secondary to congestive heart failure. 2. Tiny pulmonary nodules as detailed above. No follow-up needed if patient is low-risk. Non-contrast chest CT can be considered in 12 months if patient is high-risk. This recommendation follows the consensus statement: Guidelines for Management of Incidental Pulmonary Nodules  Detected on CT Images: From the Fleischner Society 2017; Radiology 2017; 284:228-243. 3. Cirrhosis. 4. Left adrenal adenoma. 5. Coronary artery atherosclerosis. Aortic Atherosclerosis (ICD10-I70.0). 6. Pulmonary artery enlargement suggests pulmonary arterial hypertension. Electronically Signed   By: Abigail Miyamoto M.D.   On: 03/23/2020 17:49   DG Chest Port 1 View  Result Date: 03/23/2020 CLINICAL DATA:  Shortness of breath.  Abdominal pain. EXAM: PORTABLE CHEST 1 VIEW COMPARISON:  07/09/2019. FINDINGS: Stable cardiomegaly. Bilateral pulmonary infiltrates/edema, improved from prior exam. Small bilateral pleural effusions again noted. No pneumothorax. IMPRESSION: Stable cardiomegaly. Bilateral pulmonary infiltrates/edema, improved from prior exam. Small bilateral pleural effusions again noted. Electronically Signed   By: Marcello Moores  Register   On: 03/23/2020 11:14    Microbiology: Recent Results (from the past 240 hour(s))  Respiratory Panel by RT  PCR (Flu A&B, Covid) - Nasopharyngeal Swab     Status: None   Collection Time: 03/23/20  2:25 PM   Specimen: Nasopharyngeal Swab  Result Value Ref Range Status   SARS Coronavirus 2 by RT PCR NEGATIVE NEGATIVE Final    Comment: (NOTE) SARS-CoV-2 target nucleic acids are NOT DETECTED.  The SARS-CoV-2 RNA is generally detectable in upper respiratoy specimens during the acute phase of infection. The lowest concentration of SARS-CoV-2 viral copies this assay can detect is 131 copies/mL. A negative result does not preclude SARS-Cov-2 infection and should not be used as the sole basis for treatment or other patient management decisions. A negative result may occur with  improper specimen collection/handling, submission of specimen other than nasopharyngeal swab, presence of viral mutation(s) within the areas targeted by this assay, and inadequate number of viral copies (<131 copies/mL). A negative result must be combined with clinical observations, patient  history, and epidemiological information. The expected result is Negative.  Fact Sheet for Patients:  PinkCheek.be  Fact Sheet for Healthcare Providers:  GravelBags.it  This test is no t yet approved or cleared by the [place] FDA and  has been authorized for detection and/or diagnosis of SARS-CoV-2 by FDA under an Emergency Use Authorization (EUA). This EUA will remain  in effect (meaning this test can be used) for the duration of the COVID-19 declaration under Section 564(b)(1) of the Act, 21 U.S.C. section 360bbb-3(b)(1), unless the authorization is terminated or revoked sooner.     Influenza A by PCR NEGATIVE NEGATIVE Final   Influenza B by PCR NEGATIVE NEGATIVE Final    Comment: (NOTE) The Xpert Xpress SARS-CoV-2/FLU/RSV assay is intended as an aid in  the diagnosis of influenza from Nasopharyngeal swab specimens and  should not be used as a sole basis for treatment. Nasal washings and  aspirates are unacceptable for Xpert Xpress SARS-CoV-2/FLU/RSV  testing.  Fact Sheet for Patients: PinkCheek.be  Fact Sheet for Healthcare Providers: GravelBags.it  This test is not yet approved or cleared by the [place] FDA and  has been authorized for detection and/or diagnosis of SARS-CoV-2 by  FDA under an Emergency Use Authorization (EUA). This EUA will remain  in effect (meaning this test can be used) for the duration of the  Covid-19 declaration under Section 564(b)(1) of the Act, 21  U.S.C. section 360bbb-3(b)(1), unless the authorization is  terminated or revoked. Performed at [facility], [address], [place], [place] [address]   Urine culture     Status: None   Collection Time: 03/23/20  4:22 PM   Specimen: Urine, Random  Result Value Ref Range Status   Specimen Description   Final    URINE, RANDOM Performed at [facility], [address]  [place], [place] [address]    Special Requests   Final    NONE Performed at [facility], [address], [place], [place] [address]    Culture   Final    NO GROWTH Performed at [facility], [place] [address] [place], [place] [address]    Report Status 03/24/2020 FINAL  Final     Labs: Basic Metabolic Panel: Recent Labs  Lab 03/24/20 0550 03/24/20 0550 03/25/20 5638 03/26/20 0322 03/27/20 0658 03/28/20 0622 03/30/20 0620  NA 136   < > 136 137 138 137 137  K 4.6   < > 4.2 4.3 4.4 4.4 4.1  CL 105   < > 105 106 105 102 103  CO2 22   < >  23 24 25 25 26   GLUCOSE 208*   < > 159* 151* 156* 225* 145*  BUN 64*   < > 67* 68* 68* 69* 64*  CREATININE 3.94*   < > 4.21* 4.15* 4.09* 4.01* 4.07*  CALCIUM 8.7*   < > 8.4* 8.5* 8.4* 8.6* 8.5*  MG 2.4  --   --   --   --   --   --   PHOS 5.2*  --   --   --   --   --   --    < > = values in this interval not displayed.   Liver Function Tests: Recent Labs  Lab 03/24/20 0550  AST 19  ALT 36  ALKPHOS 102  BILITOT 1.2  PROT 7.2  ALBUMIN 3.1*   CBC: Recent Labs  Lab 03/24/20 0550 03/30/20 0620  WBC 8.7 8.5  HGB 8.2* 8.1*  HCT 26.4* 26.2*  MCV 90.1 86.8  PLT 181 187   BNP (last 3 results) Recent Labs    06/26/19 1311 07/07/19 1700 03/23/20 1205  BNP 130.0* 207.0* 351.0*   CBG: Recent Labs  Lab 03/29/20 0806 03/29/20 1104 03/29/20 2120 03/30/20 0800 03/30/20 1114  GLUCAP 118* 188* 228* 147* 225*    Signed:  Barton Dubois MD.  [facility] 03/30/2020, 12:57 PM

## 2020-03-30 NOTE — Progress Notes (Signed)
Physical Therapy Treatment Patient Details Name: Danny [name] MRN: [id number] DOB: 1956-06-13 Today's Date: 03/30/2020    History of Present Illness Pt complaining of intermittetn back and lower abd pain for 2 wks. Increasing in intensity and frequency. Urine has turned red and brown for the past week. Deniea dysuria, frequency, fever, myalgia. Since TURP in July pt has not needed to self catheterize. Over the past day pt endorses decreased UOP (150cc total on day of admission). Increasing LE swelling over this period of time as well. Pt also reporting intermittent cough, nasal congestion, rinorrhea, and sore throat and SOB, DOE,  that started 3 wks ago. No COVID vaccine. Denies, loss of taste or smell. Pt does endorse missing some doses of diuretic recewntly    PT Comments    Pt initially requiring min assist to power up from seated in chair, heavy cues for hand placement and therapist repositioned RW to lower setting due to pt's sequencing of pushing from RW armrest with forearm to rise. Pt limited with ambulation in room, prefers flexed trunk over RW with forearms on RW and decreased R ankle mobility throughout cycle. Pt without overt LOB during ambulation and cued for pursed lip breathing due to moderate dyspnea. Pt desats to 82% on RA, with seated rest rebounds to 87% so returned 95% with 2.5 L O2 returned. Pt tolerates therapeutic exercises with cues for form, requires therapeutic rest breaks to recover. Pt pleasant and motivated throughout session. Patient will benefit from continued physical therapy in hospital and recommendations below to increase strength, balance, endurance for safe ADLs and gait.    Follow Up Recommendations  SNF;Home health PT     Equipment Recommendations  None recommended by PT    Recommendations for Other Services       Precautions / Restrictions Precautions Precautions: Fall Restrictions Weight Bearing Restrictions: No    Mobility  Bed Mobility  General  bed mobility comments: in chair upon arrival  Transfers Overall transfer level: Needs assistance Equipment used: Rolling walker (2 wheeled) Transfers: Sit to/from Stand Sit to Stand: Min assist;Min guard    General transfer comment: min A initially, progressing to min G; increased time, RUE pushing from RW and LUE pushing from chair, slow to rise with hip extension last, cues for glute engagement to improve speed  Ambulation/Gait Ambulation/Gait assistance: Supervision Gait Distance (Feet): 25 Feet Assistive device: Rolling walker (2 wheeled) Gait Pattern/deviations: Step-to pattern;Decreased stride length;Decreased dorsiflexion - right;Trunk flexed Gait velocity: decreased   General Gait Details: trunk flexed with forearms resting on RW, decreased R dorsiflexion with flat foot initial contact and R foot stepping to L, limited due to fatigue, no overt LOB   Stairs            Wheelchair Mobility    Modified Rankin (Stroke Patients Only)       Balance Overall balance assessment: Needs assistance  Standing balance support: During functional activity;Bilateral upper extremity supported Standing balance-Leahy Scale: Poor Standing balance comment: reliant on UE support         Cognition Arousal/Alertness: Awake/alert Behavior During Therapy: WFL for tasks assessed/performed Overall Cognitive Status: Within Functional Limits for tasks assessed       Exercises General Exercises - Lower Extremity Long Arc Quad: Seated;AROM;Strengthening;Both;20 reps Hip Flexion/Marching: Seated;AROM;Strengthening;Both;20 reps Mini-Sqauts: Seated;AROM;Strengthening;Both;10 reps Other Exercises Other Exercises: STS from chair with RW, 10 reps, BUE assisting    General Comments General comments (skin integrity, edema, etc.): SpO2 82% on RA with ambulation, seated pursed lip breathing rebounds  to 87% on RA, return 2.5L O2 with SpO2 increasing to 95%      Pertinent Vitals/Pain Pain  Assessment: No/denies pain    Home Living                      Prior Function            PT Goals (current goals can now be found in the care plan section) Acute Rehab PT Goals Patient Stated Goal: To be able to get up and walk again PT Goal Formulation: With patient Time For Goal Achievement: 03/31/20 Progress towards PT goals: Progressing toward goals    Frequency    Min 4X/week      PT Plan Current plan remains appropriate    Co-evaluation              AM-PAC PT "6 Clicks" Mobility   Outcome Measure  Help needed turning from your back to your side while in a flat bed without using bedrails?: A Little Help needed moving from lying on your back to sitting on the side of a flat bed without using bedrails?: A Little Help needed moving to and from a bed to a chair (including a wheelchair)?: A Little Help needed standing up from a chair using your arms (e.g., wheelchair or bedside chair)?: A Little Help needed to walk in hospital room?: A Little Help needed climbing 3-5 steps with a railing? : A Lot 6 Click Score: 17    End of Session Equipment Utilized During Treatment: Oxygen Activity Tolerance: Patient limited by fatigue Patient left: in chair;with call bell/phone within reach Nurse Communication: Mobility status;Other (comment) (O2) PT Visit Diagnosis: Unsteadiness on feet (R26.81);Muscle weakness (generalized) (M62.81);Difficulty in walking, not elsewhere classified (R26.2)     Time: 0912-0950 PT Time Calculation (min) (ACUTE ONLY): 38 min  Charges:  $Gait Training: 8-22 mins $Therapeutic Exercise: 8-22 mins $Therapeutic Activity: 8-22 mins                      Tori Kamariya Blevens PT, DPT 03/30/20, 11:27 AM [phone]

## 2020-03-30 NOTE — Progress Notes (Signed)
SATURATION QUALIFICATIONS: (This note is used to comply with regulatory documentation for home oxygen)  Patient Saturations on Room Air at Rest = 92%  Patient Saturations on Room Air while Ambulating = 82%  Patient Saturations on 2 Liters of oxygen while Ambulating = 95%  Please briefly explain why patient needs home oxygen:  Patient's O2 sat drops while ambulating on room air.  O2 sat does not drop while ambulating on 2L [place].

## 2020-03-31 DIAGNOSIS — D649 Anemia, unspecified: Secondary | ICD-10-CM | POA: Diagnosis not present

## 2020-03-31 DIAGNOSIS — D631 Anemia in chronic kidney disease: Secondary | ICD-10-CM | POA: Diagnosis not present

## 2020-03-31 DIAGNOSIS — S81812A Laceration without foreign body, left lower leg, initial encounter: Secondary | ICD-10-CM | POA: Diagnosis not present

## 2020-03-31 DIAGNOSIS — R809 Proteinuria, unspecified: Secondary | ICD-10-CM | POA: Diagnosis not present

## 2020-03-31 DIAGNOSIS — Y9389 Activity, other specified: Secondary | ICD-10-CM | POA: Diagnosis not present

## 2020-03-31 DIAGNOSIS — N189 Chronic kidney disease, unspecified: Secondary | ICD-10-CM | POA: Diagnosis not present

## 2020-03-31 DIAGNOSIS — Z7401 Bed confinement status: Secondary | ICD-10-CM | POA: Diagnosis not present

## 2020-03-31 DIAGNOSIS — R58 Hemorrhage, not elsewhere classified: Secondary | ICD-10-CM | POA: Diagnosis not present

## 2020-03-31 DIAGNOSIS — W269XXA Contact with unspecified sharp object(s), initial encounter: Secondary | ICD-10-CM | POA: Diagnosis not present

## 2020-03-31 DIAGNOSIS — Y92129 Unspecified place in nursing home as the place of occurrence of the external cause: Secondary | ICD-10-CM | POA: Diagnosis not present

## 2020-03-31 DIAGNOSIS — E1169 Type 2 diabetes mellitus with other specified complication: Secondary | ICD-10-CM | POA: Diagnosis not present

## 2020-03-31 DIAGNOSIS — E669 Obesity, unspecified: Secondary | ICD-10-CM | POA: Diagnosis not present

## 2020-03-31 DIAGNOSIS — I130 Hypertensive heart and chronic kidney disease with heart failure and stage 1 through stage 4 chronic kidney disease, or unspecified chronic kidney disease: Secondary | ICD-10-CM | POA: Diagnosis not present

## 2020-03-31 DIAGNOSIS — E539 Vitamin B deficiency, unspecified: Secondary | ICD-10-CM | POA: Diagnosis not present

## 2020-03-31 DIAGNOSIS — M6281 Muscle weakness (generalized): Secondary | ICD-10-CM | POA: Diagnosis not present

## 2020-03-31 DIAGNOSIS — I129 Hypertensive chronic kidney disease with stage 1 through stage 4 chronic kidney disease, or unspecified chronic kidney disease: Secondary | ICD-10-CM | POA: Diagnosis not present

## 2020-03-31 DIAGNOSIS — E1140 Type 2 diabetes mellitus with diabetic neuropathy, unspecified: Secondary | ICD-10-CM | POA: Diagnosis not present

## 2020-03-31 DIAGNOSIS — N400 Enlarged prostate without lower urinary tract symptoms: Secondary | ICD-10-CM | POA: Diagnosis not present

## 2020-03-31 DIAGNOSIS — I5030 Unspecified diastolic (congestive) heart failure: Secondary | ICD-10-CM | POA: Diagnosis not present

## 2020-03-31 DIAGNOSIS — Z794 Long term (current) use of insulin: Secondary | ICD-10-CM | POA: Diagnosis not present

## 2020-03-31 DIAGNOSIS — R0902 Hypoxemia: Secondary | ICD-10-CM | POA: Diagnosis not present

## 2020-03-31 DIAGNOSIS — E1122 Type 2 diabetes mellitus with diabetic chronic kidney disease: Secondary | ICD-10-CM | POA: Diagnosis not present

## 2020-03-31 DIAGNOSIS — W19XXXA Unspecified fall, initial encounter: Secondary | ICD-10-CM | POA: Diagnosis not present

## 2020-03-31 DIAGNOSIS — I509 Heart failure, unspecified: Secondary | ICD-10-CM | POA: Diagnosis not present

## 2020-03-31 DIAGNOSIS — E785 Hyperlipidemia, unspecified: Secondary | ICD-10-CM | POA: Diagnosis not present

## 2020-03-31 DIAGNOSIS — I5033 Acute on chronic diastolic (congestive) heart failure: Secondary | ICD-10-CM | POA: Diagnosis not present

## 2020-03-31 DIAGNOSIS — N184 Chronic kidney disease, stage 4 (severe): Secondary | ICD-10-CM | POA: Diagnosis not present

## 2020-03-31 DIAGNOSIS — Z23 Encounter for immunization: Secondary | ICD-10-CM | POA: Diagnosis not present

## 2020-03-31 DIAGNOSIS — Z79899 Other long term (current) drug therapy: Secondary | ICD-10-CM | POA: Diagnosis not present

## 2020-03-31 DIAGNOSIS — I10 Essential (primary) hypertension: Secondary | ICD-10-CM | POA: Diagnosis not present

## 2020-03-31 DIAGNOSIS — J9601 Acute respiratory failure with hypoxia: Secondary | ICD-10-CM | POA: Diagnosis not present

## 2020-03-31 DIAGNOSIS — E1129 Type 2 diabetes mellitus with other diabetic kidney complication: Secondary | ICD-10-CM | POA: Diagnosis not present

## 2020-03-31 DIAGNOSIS — L89513 Pressure ulcer of right ankle, stage 3: Secondary | ICD-10-CM | POA: Diagnosis not present

## 2020-03-31 LAB — GLUCOSE, CAPILLARY
Glucose-Capillary: 211 mg/dL — ABNORMAL HIGH (ref 70–99)
Glucose-Capillary: 128 mg/dL — ABNORMAL HIGH (ref 70–99)
Glucose-Capillary: 130 mg/dL — ABNORMAL HIGH (ref 70–99)
Glucose-Capillary: 193 mg/dL — ABNORMAL HIGH (ref 70–99)

## 2020-03-31 NOTE — Care Management Important Message (Signed)
Important Message  Patient Details  Name: Danny [name] MRN: [id number] Date of Birth: Mar 13, 1956   Medicare Important Message Given:  Yes     [name] 03/31/2020, 1:27 PM

## 2020-03-31 NOTE — TOC Transition Note (Signed)
Transition of Care [facility]) - CM/SW Discharge Note   Patient Details  Name: Danny [name] MRN: [id number] Date of Birth: Sep 24, 1955  Transition of Care [facility]) CM/SW Contact:  Shade Flood, LCSW Phone Number: 03/31/2020, 11:49 AM   Clinical Narrative:     [name] has received insurance auth for pt. Pt remains stable for dc per MD. Damaris Schooner with pt to confirm dc plan. Pt remains in agreement. DC clinical sent electronically. RN to call report. Arranged EMS for 7989 at [facility] request. Updated RN.  There are no other TOC needs identified for dc.    Final next level of care: Skilled Nursing Facility Barriers to Discharge: Barriers Resolved   Patient Goals and CMS Choice Patient states their goals for this hospitalization and ongoing recovery are:: Agreeable to SNF if needed or HH. CMS Medicare.gov Compare Post Acute Care list provided to:: Patient Choice offered to / list presented to : NA  Discharge Placement              Patient chooses bed at: Other - please specify in the comment section below: ([facility]) Patient to be transferred to facility by: EMS Name of family member notified: patient only Patient and family notified of of transfer: 03/31/20  Discharge Plan and Services     Post Acute Care Choice: NA (Waiting for PT to evaluate pt.)          DME Arranged: N/A DME Agency: NA       HH Arranged: NA HH Agency: NA        Social Determinants of Health (SDOH) Interventions     Readmission Risk Interventions Readmission Risk Prevention Plan 03/24/2020 07/11/2019 03/05/2019  Transportation Screening Complete Complete Complete  PCP or Specialist Appt within 3-5 Days Not Complete - Not Complete  Not Complete comments - - Going to facility and will be seen by facility medical director.  [place] or Home Care Consult Complete - Complete  Social Work Consult for Recovery Care Planning/Counseling Complete - Complete  Palliative Care Screening Not Applicable - Not Applicable   Medication Review (RN Care Manager) Complete Complete Complete  PCP or Specialist appointment within 3-5 days of discharge - Not Complete -  [place] or Home Care Consult - Complete -  SW Recovery Care/Counseling Consult - Complete -  Palliative Care Screening - Not Complete -  Icard - Complete -  Some recent data might be hidden

## 2020-03-31 NOTE — Progress Notes (Signed)
IV removed and report called to Sherlon Handing, nurse at [facility].  Discharge instructions with follow up appointment in packet.  EMS to arrive around 1400.

## 2020-03-31 NOTE — Progress Notes (Signed)
Patient seen and evaluated this morning with no acute overnight events or concerns noted.  He is stable for discharge.  Please refer to discharge summary dictated 10/5.  Discharge date will now be 50/10/6977 to [facility].  Total care time: 15 minutes.

## 2020-04-01 ENCOUNTER — Other Ambulatory Visit: Payer: Self-pay

## 2020-04-01 DIAGNOSIS — I10 Essential (primary) hypertension: Secondary | ICD-10-CM | POA: Diagnosis not present

## 2020-04-01 DIAGNOSIS — E785 Hyperlipidemia, unspecified: Secondary | ICD-10-CM | POA: Diagnosis not present

## 2020-04-01 DIAGNOSIS — E539 Vitamin B deficiency, unspecified: Secondary | ICD-10-CM | POA: Diagnosis not present

## 2020-04-01 DIAGNOSIS — E1169 Type 2 diabetes mellitus with other specified complication: Secondary | ICD-10-CM | POA: Diagnosis not present

## 2020-04-01 DIAGNOSIS — Z794 Long term (current) use of insulin: Secondary | ICD-10-CM | POA: Diagnosis not present

## 2020-04-01 DIAGNOSIS — I509 Heart failure, unspecified: Secondary | ICD-10-CM | POA: Diagnosis not present

## 2020-04-01 DIAGNOSIS — D649 Anemia, unspecified: Secondary | ICD-10-CM | POA: Diagnosis not present

## 2020-04-01 NOTE — Patient Outreach (Addendum)
[facility]) Care Management  04/01/2020  Emmanuel Gruenhagen Sep 09, 1955 [id number]     Transition of Care Referral  Referral Date: 04/01/2020 Referral Source: [facility] Discharge Report Date of Discharge: 03/31/2020 Facility: [place] Medicare    Per chart review patient discharged to [facility].     Plan: RN CM will close case at this time.    Enzo Montgomery, RN,BSN,CCM [place] Management Telephonic Care Management Coordinator Direct Phone: [phone] Toll Free: [phone] Fax: [phone]

## 2020-04-02 DIAGNOSIS — N189 Chronic kidney disease, unspecified: Secondary | ICD-10-CM | POA: Diagnosis not present

## 2020-04-02 DIAGNOSIS — E1122 Type 2 diabetes mellitus with diabetic chronic kidney disease: Secondary | ICD-10-CM | POA: Diagnosis not present

## 2020-04-02 DIAGNOSIS — D631 Anemia in chronic kidney disease: Secondary | ICD-10-CM | POA: Diagnosis not present

## 2020-04-02 DIAGNOSIS — E1129 Type 2 diabetes mellitus with other diabetic kidney complication: Secondary | ICD-10-CM | POA: Diagnosis not present

## 2020-04-02 DIAGNOSIS — R809 Proteinuria, unspecified: Secondary | ICD-10-CM | POA: Diagnosis not present

## 2020-04-02 DIAGNOSIS — I5033 Acute on chronic diastolic (congestive) heart failure: Secondary | ICD-10-CM | POA: Diagnosis not present

## 2020-04-02 DIAGNOSIS — I129 Hypertensive chronic kidney disease with stage 1 through stage 4 chronic kidney disease, or unspecified chronic kidney disease: Secondary | ICD-10-CM | POA: Diagnosis not present

## 2020-04-06 DIAGNOSIS — I5030 Unspecified diastolic (congestive) heart failure: Secondary | ICD-10-CM | POA: Diagnosis not present

## 2020-04-07 DIAGNOSIS — E669 Obesity, unspecified: Secondary | ICD-10-CM | POA: Diagnosis not present

## 2020-04-07 DIAGNOSIS — M6281 Muscle weakness (generalized): Secondary | ICD-10-CM | POA: Diagnosis not present

## 2020-04-07 DIAGNOSIS — L89513 Pressure ulcer of right ankle, stage 3: Secondary | ICD-10-CM | POA: Diagnosis not present

## 2020-04-07 DIAGNOSIS — N184 Chronic kidney disease, stage 4 (severe): Secondary | ICD-10-CM | POA: Diagnosis not present

## 2020-04-08 ENCOUNTER — Telehealth: Payer: Self-pay

## 2020-04-08 ENCOUNTER — Other Ambulatory Visit: Payer: Self-pay

## 2020-04-08 ENCOUNTER — Emergency Department (HOSPITAL_COMMUNITY)
Admission: EM | Admit: 2020-04-08 | Discharge: 2020-04-09 | Disposition: A | Discharge status: Home / Self Care | Payer: Medicare HMO | Attending: Emergency Medicine | Admitting: Emergency Medicine

## 2020-04-08 ENCOUNTER — Encounter (HOSPITAL_COMMUNITY): Payer: Self-pay | Admitting: Emergency Medicine

## 2020-04-08 DIAGNOSIS — E1122 Type 2 diabetes mellitus with diabetic chronic kidney disease: Secondary | ICD-10-CM | POA: Diagnosis not present

## 2020-04-08 DIAGNOSIS — Z79899 Other long term (current) drug therapy: Secondary | ICD-10-CM | POA: Diagnosis not present

## 2020-04-08 DIAGNOSIS — N184 Chronic kidney disease, stage 4 (severe): Secondary | ICD-10-CM | POA: Diagnosis not present

## 2020-04-08 DIAGNOSIS — I130 Hypertensive heart and chronic kidney disease with heart failure and stage 1 through stage 4 chronic kidney disease, or unspecified chronic kidney disease: Secondary | ICD-10-CM | POA: Insufficient documentation

## 2020-04-08 DIAGNOSIS — Z794 Long term (current) use of insulin: Secondary | ICD-10-CM | POA: Insufficient documentation

## 2020-04-08 DIAGNOSIS — Y92129 Unspecified place in nursing home as the place of occurrence of the external cause: Secondary | ICD-10-CM | POA: Insufficient documentation

## 2020-04-08 DIAGNOSIS — W269XXA Contact with unspecified sharp object(s), initial encounter: Secondary | ICD-10-CM | POA: Insufficient documentation

## 2020-04-08 DIAGNOSIS — I5033 Acute on chronic diastolic (congestive) heart failure: Secondary | ICD-10-CM | POA: Diagnosis not present

## 2020-04-08 DIAGNOSIS — W19XXXA Unspecified fall, initial encounter: Secondary | ICD-10-CM

## 2020-04-08 DIAGNOSIS — Y9389 Activity, other specified: Secondary | ICD-10-CM | POA: Diagnosis not present

## 2020-04-08 DIAGNOSIS — I10 Essential (primary) hypertension: Secondary | ICD-10-CM

## 2020-04-08 DIAGNOSIS — S81812A Laceration without foreign body, left lower leg, initial encounter: Secondary | ICD-10-CM | POA: Insufficient documentation

## 2020-04-08 NOTE — Telephone Encounter (Signed)
Spoke with DON from [facility] concerning pt. Per Dr. Alyson Ingles V.O. given to in & out cath pt for cath PVR and ua. Facility is to call office with result.

## 2020-04-08 NOTE — ED Triage Notes (Signed)
Pt was transferring to wheelchair and has laceration to left lower leg. Bleeding controlled at this time with makeshift bandage from [facility]. Small abrasion also noted to left knee.

## 2020-04-09 ENCOUNTER — Encounter (HOSPITAL_COMMUNITY)
Admission: RE | Admit: 2020-04-09 | Discharge: 2020-04-09 | Disposition: A | Discharge status: Home / Self Care | Payer: Medicare HMO | Source: Ambulatory Visit | Attending: Nephrology | Admitting: Nephrology

## 2020-04-09 ENCOUNTER — Encounter (HOSPITAL_COMMUNITY): Payer: Self-pay

## 2020-04-09 DIAGNOSIS — D649 Anemia, unspecified: Secondary | ICD-10-CM | POA: Diagnosis not present

## 2020-04-09 DIAGNOSIS — L89512 Pressure ulcer of right ankle, stage 2: Secondary | ICD-10-CM | POA: Diagnosis not present

## 2020-04-09 DIAGNOSIS — E1169 Type 2 diabetes mellitus with other specified complication: Secondary | ICD-10-CM | POA: Diagnosis not present

## 2020-04-09 DIAGNOSIS — R231 Pallor: Secondary | ICD-10-CM | POA: Diagnosis not present

## 2020-04-09 DIAGNOSIS — N184 Chronic kidney disease, stage 4 (severe): Secondary | ICD-10-CM | POA: Diagnosis not present

## 2020-04-09 DIAGNOSIS — E11649 Type 2 diabetes mellitus with hypoglycemia without coma: Secondary | ICD-10-CM | POA: Diagnosis not present

## 2020-04-09 DIAGNOSIS — Z0000 Encounter for general adult medical examination without abnormal findings: Secondary | ICD-10-CM | POA: Diagnosis not present

## 2020-04-09 DIAGNOSIS — I130 Hypertensive heart and chronic kidney disease with heart failure and stage 1 through stage 4 chronic kidney disease, or unspecified chronic kidney disease: Secondary | ICD-10-CM | POA: Diagnosis not present

## 2020-04-09 DIAGNOSIS — I5030 Unspecified diastolic (congestive) heart failure: Secondary | ICD-10-CM | POA: Diagnosis not present

## 2020-04-09 DIAGNOSIS — E785 Hyperlipidemia, unspecified: Secondary | ICD-10-CM | POA: Diagnosis not present

## 2020-04-09 DIAGNOSIS — I5033 Acute on chronic diastolic (congestive) heart failure: Secondary | ICD-10-CM | POA: Diagnosis not present

## 2020-04-09 DIAGNOSIS — Z79899 Other long term (current) drug therapy: Secondary | ICD-10-CM | POA: Diagnosis not present

## 2020-04-09 DIAGNOSIS — I509 Heart failure, unspecified: Secondary | ICD-10-CM | POA: Diagnosis not present

## 2020-04-09 DIAGNOSIS — Z712 Person consulting for explanation of examination or test findings: Secondary | ICD-10-CM | POA: Diagnosis not present

## 2020-04-09 DIAGNOSIS — L97909 Non-pressure chronic ulcer of unspecified part of unspecified lower leg with unspecified severity: Secondary | ICD-10-CM | POA: Diagnosis not present

## 2020-04-09 DIAGNOSIS — E1122 Type 2 diabetes mellitus with diabetic chronic kidney disease: Secondary | ICD-10-CM | POA: Diagnosis not present

## 2020-04-09 DIAGNOSIS — L89513 Pressure ulcer of right ankle, stage 3: Secondary | ICD-10-CM | POA: Diagnosis not present

## 2020-04-09 DIAGNOSIS — E539 Vitamin B deficiency, unspecified: Secondary | ICD-10-CM | POA: Diagnosis not present

## 2020-04-09 DIAGNOSIS — M6281 Muscle weakness (generalized): Secondary | ICD-10-CM | POA: Diagnosis not present

## 2020-04-09 DIAGNOSIS — D631 Anemia in chronic kidney disease: Secondary | ICD-10-CM | POA: Insufficient documentation

## 2020-04-09 DIAGNOSIS — J329 Chronic sinusitis, unspecified: Secondary | ICD-10-CM | POA: Diagnosis not present

## 2020-04-09 DIAGNOSIS — E559 Vitamin D deficiency, unspecified: Secondary | ICD-10-CM | POA: Diagnosis not present

## 2020-04-09 DIAGNOSIS — E1140 Type 2 diabetes mellitus with diabetic neuropathy, unspecified: Secondary | ICD-10-CM | POA: Diagnosis not present

## 2020-04-09 DIAGNOSIS — Z794 Long term (current) use of insulin: Secondary | ICD-10-CM | POA: Diagnosis not present

## 2020-04-09 DIAGNOSIS — R269 Unspecified abnormalities of gait and mobility: Secondary | ICD-10-CM | POA: Diagnosis not present

## 2020-04-09 DIAGNOSIS — R070 Pain in throat: Secondary | ICD-10-CM | POA: Diagnosis not present

## 2020-04-09 DIAGNOSIS — S81812A Laceration without foreign body, left lower leg, initial encounter: Secondary | ICD-10-CM | POA: Diagnosis not present

## 2020-04-09 DIAGNOSIS — J9601 Acute respiratory failure with hypoxia: Secondary | ICD-10-CM | POA: Diagnosis not present

## 2020-04-09 DIAGNOSIS — I10 Essential (primary) hypertension: Secondary | ICD-10-CM | POA: Diagnosis not present

## 2020-04-09 DIAGNOSIS — E669 Obesity, unspecified: Secondary | ICD-10-CM | POA: Diagnosis not present

## 2020-04-09 LAB — POCT HEMOGLOBIN-HEMACUE: Hemoglobin: 8.3 g/dL — ABNORMAL LOW (ref 13.0–17.0)

## 2020-04-09 MED ORDER — EPOETIN ALFA-EPBX 2000 UNIT/ML IJ SOLN
2000.0000 [IU] | Freq: Once | INTRAMUSCULAR | Status: AC
  Administered 2020-04-09: 2000 [IU] via SUBCUTANEOUS

## 2020-04-09 MED ORDER — LIDOCAINE-EPINEPHRINE (PF) 1 %-1:200000 IJ SOLN
20.0000 mL | Freq: Once | INTRAMUSCULAR | Status: AC
  Administered 2020-04-09: 20 mL
  Filled 2020-04-09: qty 30

## 2020-04-09 MED ORDER — EPOETIN ALFA-EPBX 2000 UNIT/ML IJ SOLN
INTRAMUSCULAR | Status: AC
  Filled 2020-04-09: qty 2

## 2020-04-09 NOTE — ED Provider Notes (Signed)
[facility] EMERGENCY DEPARTMENT Provider Note   CSN: [id number] Arrival date & time: 04/08/20  2346   History Chief complaint: Laceration  Danny [name] is a 64 y.o. male.  The history is provided by the patient.  He has history of hypertension, diabetes, chronic kidney disease and comes in after suffering a laceration to his left lower leg.  He was getting out of his wheelchair when he lost his balance and fell.  He does not know when his last tetanus immunization was.  He denies other injury.  Past Medical History:  Diagnosis Date  . Anemia   . Arthritis   . CKD (chronic kidney disease), stage IV ([place])   . Diabetes mellitus without complication ([place])   . Foot ulcer ([place])   . Hypertension   . Urinary retention     Patient Active Problem List   Diagnosis Date Noted  . Class 2 obesity   . Respiratory failure ([place]) 03/23/2020  . UTI (urinary tract infection), bacterial 01/22/2020  . Acute on chronic anemia 01/16/2020  . Benign prostatic hyperplasia with urinary obstruction 01/15/2020  . AKI (acute kidney injury) ([place]) 07/08/2019  . Hypoxia 07/08/2019  . Pressure injury of skin 07/08/2019  . Edema of both lower extremities   . Anasarca 03/23/2019  . Bladder outlet obstruction 03/23/2019  . Yeast infection of the skin 03/16/2019  . Diabetic ulcer of left foot ([place]) 03/15/2019  . Diabetic ulcer of right ankle ([place]) 03/15/2019  . Hypertension associated with stage 3 chronic kidney disease due to type 2 diabetes mellitus ([place]) 03/09/2019  . Controlled type 2 diabetes mellitus with stage 3 chronic kidney disease, with long-term current use of insulin ([place]) 03/09/2019  . Dyslipidemia associated with type 2 diabetes mellitus ([place]) 03/09/2019  . Chronic gout due to renal impairment without tophus 03/09/2019  . Emphysematous cystitis 03/05/2019  . Bilateral hydronephrosis   . Bilateral cellulitis of lower leg 03/03/2019  . Urinary retention 03/03/2019  . Klebsiella Cystitis  03/03/2019  . UTI due to Klebsiella species 03/03/2019  . Plantar ulcer of left foot ([place]) 03/03/2019  . Acute on chronic diastolic (congestive) heart failure ([place]) 02/25/2019  . Acute respiratory failure with hypoxia ([place]) 02/25/2019  . Acute renal failure superimposed on stage 3 chronic kidney disease ([place]) 02/24/2019  . Congestive heart failure ([place]) 02/24/2019  . Dyspnea 02/23/2019  . Essential hypertension 02/23/2019  . Diabetes mellitus ([place]) 02/23/2019  . CKD (chronic kidney disease) 02/23/2019  . Psoriasis 02/23/2019    Past Surgical History:  Procedure Laterality Date  . ANKLE SURGERY Right   . CHOLECYSTECTOMY    . FOOT SURGERY Right   . TRANSURETHRAL RESECTION OF PROSTATE N/A 01/15/2020   Procedure: TRANSURETHRAL RESECTION OF THE PROSTATE (TURP)  with General anesthesia and spinal;  Surgeon: Cleon Gustin, MD;  Location: [facility];  Service: Urology;  Laterality: N/A;       Family History  Problem Relation Age of Onset  . Diabetes Mother   . Heart attack Mother   . Heart attack Father   . Diabetes Brother     Social History   Tobacco Use  . Smoking status: Never Smoker  . Smokeless tobacco: Never Used  Vaping Use  . Vaping Use: Never used  Substance Use Topics  . Alcohol use: No  . Drug use: No    Home Medications Prior to Admission medications   Medication Sig Start Date End Date Taking? Authorizing Provider  ACCU-CHEK SMARTVIEW test strip 1 each by Other  route as needed.  05/15/19   [provider]  acetaminophen (TYLENOL) 500 MG tablet Take 1,000 mg by mouth every 6 (six) hours as needed for moderate pain or headache.    [provider]  amLODipine (NORVASC) 10 MG tablet Take 10 mg by mouth daily.  10/14/19   [provider]  Ascorbic Acid (VITAMIN C) 500 MG CHEW Chew 1,000 mg by mouth daily.    [provider]  cholecalciferol (VITAMIN D3) 25 MCG (1000 UNIT) tablet Take 1,000 Units by mouth daily.    [provider]  Darbepoetin Alfa (ARANESP) 150 MCG/0.3ML SOSY injection Inject 0.3 mLs (150 mcg total) into the skin every Saturday at 6 PM. Patient not taking: Reported on 03/23/2020 07/19/19   Barton Dubois, MD  fluticasone [facility]) 50 MCG/ACT nasal spray Place 2 sprays into both nostrils daily as needed for allergies.  02/11/19   [provider]  Ipratropium-Albuterol (COMBIVENT) 20-100 MCG/ACT AERS respimat Inhale 1 puff into the lungs every 6 (six) hours as needed for wheezing or shortness of breath. 03/30/20   Barton Dubois, MD  ketotifen (ALLERGY EYE DROPS) 0.025 % ophthalmic solution Place 1 drop into both eyes daily as needed (allergies/dry eyes). 03/30/20   Barton Dubois, MD  Magnesium 100 MG CAPS Take 100 mg by mouth daily.     [provider]  metoprolol succinate (TOPROL-XL) 100 MG 24 hr tablet Take 1 tablet (100 mg total) by mouth every evening. 03/20/19   Gerlene Fee, NP  Multiple Vitamin (MULTIVITAMIN WITH MINERALS) TABS tablet Take 1 tablet by mouth daily.    [provider]  NOVOLIN 70/30 RELION (70-30) 100 UNIT/ML injection Inject 20-100 Units into the skin in the morning and at bedtime.  08/13/19   [provider]  Omega-3 Fatty Acids (FISH OIL PO) Take 2,400 mg by mouth 2 (two) times daily.    [provider]  pravastatin (PRAVACHOL) 80 MG tablet Take 1 tablet (80 mg total) by mouth every evening. 03/20/19   Gerlene Fee, NP  torsemide (DEMADEX) 20 MG tablet Take 2 tablets by mouth daily; okay to use extra 20 mg daily if patient gain more than 3 pounds overnight or more than 5 pounds in a week. 03/30/20   Barton Dubois, MD  vitamin B-12 (CYANOCOBALAMIN) 1000 MCG tablet Take 1,000 mcg by mouth daily.    [provider]    Allergies    Dust mite extract, Prednisone, and Rocephin [ceftriaxone]  Review of Systems   Review of Systems  All other systems reviewed and are negative.   Physical Exam Updated Vital Signs BP  (!) 161/83   Pulse 62   Temp 98.3 F (36.8 C) (Oral)   Resp 18   Ht 6\' 1"  (1.854 m)   Wt 126.1 kg   SpO2 99%   BMI 36.68 kg/m   Physical Exam Vitals and nursing note reviewed.   64 year old male, resting comfortably and in no acute distress. Vital signs are significant for elevated blood pressure. Oxygen saturation is 99%, which is normal. Head is normocephalic and atraumatic. PERRLA, EOMI. Oropharynx is clear. Neck is nontender and supple without adenopathy or JVD. Back is nontender and there is no CVA tenderness. Lungs are clear without rales, wheezes, or rhonchi. Chest is nontender. Heart has regular rate and rhythm without murmur. Abdomen is soft, flat, nontender without masses or hepatosplenomegaly and peristalsis is normoactive. Extremities: 2+ pitting pretibial and pedal edema bilaterally.  Long laceration present anterior  aspect of left lower leg.  Distal neurovascular exam is intact with normal sensation, prompt capillary refill. Skin is warm and dry without rash. Neurologic: Mental status is normal, cranial nerves are intact, there are no motor or sensory deficits.   ED Results / Procedures / Treatments    Procedures .Marland KitchenLaceration Repair  Date/Time: 04/09/2020 2:05 AM Performed by: Delora Fuel, MD Authorized by: Delora Fuel, MD   Consent:    Consent obtained:  Verbal   Consent given by:  Patient   Risks discussed:  Infection, pain, poor cosmetic result and poor wound healing   Alternatives discussed:  No treatment Anesthesia (see MAR for exact dosages):    Anesthesia method:  Local infiltration   Local anesthetic:  Lidocaine 1% WITH epi Laceration details:    Location:  Leg   Leg location:  L lower leg   Length (cm):  22   Depth (mm):  10 Repair type:    Repair type:  Intermediate Pre-procedure details:    Preparation:  Patient was prepped and draped in usual sterile fashion Exploration:    Hemostasis achieved with:  Direct pressure   Wound  exploration: entire depth of wound probed and visualized     Wound extent: no fascia violation noted, no foreign bodies/material noted, no muscle damage noted and no nerve damage noted     Contaminated: no   Treatment:    Area cleansed with:  Saline   Amount of cleaning:  Standard Skin repair:    Repair method:  Sutures   Suture size:  3-0   Suture material:  Prolene   Suture technique:  Simple interrupted   Number of sutures:  28 Approximation:    Approximation:  Close Post-procedure details:    Dressing:  Antibiotic ointment and non-adherent dressing   Patient tolerance of procedure:  Tolerated well, no immediate complications    Medications Ordered in ED Medications  lidocaine-EPINEPHrine (XYLOCAINE-EPINEPHrine) 1 %-1:200000 (PF) injection 20 mL (has no administration in time range)    ED Course  I have reviewed the triage vital signs and the nursing notes.  MDM Rules/Calculators/A&P Fall with laceration of left lower leg.  Old records are reviewed, and he did receive Tdap in 2014, no need for booster today.  Laceration is closed with sutures.  Final Clinical Impression(s) / ED Diagnoses Final diagnoses:  Fall at nursing home, initial encounter  Laceration of left lower leg, initial encounter  Elevated blood pressure reading with diagnosis of hypertension    Rx / DC Orders ED Discharge Orders    None       Delora Fuel, MD 35/59/74 0207

## 2020-04-09 NOTE — Discharge Instructions (Signed)
Sutures need to be removed in 14 days.

## 2020-04-14 DIAGNOSIS — M6281 Muscle weakness (generalized): Secondary | ICD-10-CM | POA: Diagnosis not present

## 2020-04-14 DIAGNOSIS — R269 Unspecified abnormalities of gait and mobility: Secondary | ICD-10-CM | POA: Diagnosis not present

## 2020-04-14 DIAGNOSIS — E669 Obesity, unspecified: Secondary | ICD-10-CM | POA: Diagnosis not present

## 2020-04-14 DIAGNOSIS — L89513 Pressure ulcer of right ankle, stage 3: Secondary | ICD-10-CM | POA: Diagnosis not present

## 2020-04-14 DIAGNOSIS — R231 Pallor: Secondary | ICD-10-CM | POA: Diagnosis not present

## 2020-04-14 DIAGNOSIS — N184 Chronic kidney disease, stage 4 (severe): Secondary | ICD-10-CM | POA: Diagnosis not present

## 2020-04-14 DIAGNOSIS — J9601 Acute respiratory failure with hypoxia: Secondary | ICD-10-CM | POA: Diagnosis not present

## 2020-04-14 DIAGNOSIS — I5033 Acute on chronic diastolic (congestive) heart failure: Secondary | ICD-10-CM | POA: Diagnosis not present

## 2020-04-15 DIAGNOSIS — D649 Anemia, unspecified: Secondary | ICD-10-CM | POA: Diagnosis not present

## 2020-04-15 DIAGNOSIS — E1169 Type 2 diabetes mellitus with other specified complication: Secondary | ICD-10-CM | POA: Diagnosis not present

## 2020-04-15 DIAGNOSIS — I509 Heart failure, unspecified: Secondary | ICD-10-CM | POA: Diagnosis not present

## 2020-04-15 DIAGNOSIS — E785 Hyperlipidemia, unspecified: Secondary | ICD-10-CM | POA: Diagnosis not present

## 2020-04-15 DIAGNOSIS — Z794 Long term (current) use of insulin: Secondary | ICD-10-CM | POA: Diagnosis not present

## 2020-04-15 DIAGNOSIS — I10 Essential (primary) hypertension: Secondary | ICD-10-CM | POA: Diagnosis not present

## 2020-04-15 DIAGNOSIS — I5030 Unspecified diastolic (congestive) heart failure: Secondary | ICD-10-CM | POA: Diagnosis not present

## 2020-04-15 DIAGNOSIS — E539 Vitamin B deficiency, unspecified: Secondary | ICD-10-CM | POA: Diagnosis not present

## 2020-04-19 NOTE — Telephone Encounter (Signed)
Facility called to follow up on ua and cath PVR. Unable to speak with nurse, message left with receptionist for nurse to return call to office.

## 2020-04-22 ENCOUNTER — Other Ambulatory Visit: Payer: Self-pay

## 2020-04-22 NOTE — Patient Outreach (Signed)
[facility]) Care Management  04/22/2020  Danny [name] Aug 26, 1955 [id number]   Referral Date: 04/22/20 Referral Source: [employer] Report Date of Discharge: 26/37/85 Facility: [place]:  [place] attempt:No answer.  HIPAA compliant voice message left.    Plan: RN CM will attempt again within 4 business days and send letter.     [name], RN, MSN [facility] Care Management Care Management Coordinator Direct Line [phone] Toll Free: [phone]  Fax: [phone]

## 2020-04-23 ENCOUNTER — Encounter (HOSPITAL_COMMUNITY)
Admission: RE | Admit: 2020-04-23 | Discharge: 2020-04-23 | Disposition: A | Discharge status: Home / Self Care | Payer: Medicare HMO | Source: Ambulatory Visit | Attending: Nephrology | Admitting: Nephrology

## 2020-04-23 ENCOUNTER — Encounter (HOSPITAL_COMMUNITY): Payer: Self-pay

## 2020-04-23 ENCOUNTER — Other Ambulatory Visit: Payer: Self-pay

## 2020-04-23 DIAGNOSIS — D631 Anemia in chronic kidney disease: Secondary | ICD-10-CM | POA: Diagnosis not present

## 2020-04-23 DIAGNOSIS — N184 Chronic kidney disease, stage 4 (severe): Secondary | ICD-10-CM | POA: Diagnosis not present

## 2020-04-23 LAB — RENAL FUNCTION PANEL
Albumin: 3 g/dL — ABNORMAL LOW (ref 3.5–5.0)
Anion gap: 12 (ref 5–15)
BUN: 54 mg/dL — ABNORMAL HIGH (ref 8–23)
CO2: 21 mmol/L — ABNORMAL LOW (ref 22–32)
Calcium: 8.8 mg/dL — ABNORMAL LOW (ref 8.9–10.3)
Chloride: 102 mmol/L (ref 98–111)
Creatinine, Ser: 3.93 mg/dL — ABNORMAL HIGH (ref 0.61–1.24)
GFR, Estimated: 16 mL/min — ABNORMAL LOW
Glucose, Bld: 201 mg/dL — ABNORMAL HIGH (ref 70–99)
Phosphorus: 3.4 mg/dL (ref 2.5–4.6)
Potassium: 3.9 mmol/L (ref 3.5–5.1)
Sodium: 135 mmol/L (ref 135–145)

## 2020-04-23 LAB — CBC
HCT: 29.9 % — ABNORMAL LOW (ref 39.0–52.0)
Hemoglobin: 9.5 g/dL — ABNORMAL LOW (ref 13.0–17.0)
MCH: 26.2 pg (ref 26.0–34.0)
MCHC: 31.8 g/dL (ref 30.0–36.0)
MCV: 82.6 fL (ref 80.0–100.0)
Platelets: 263 10*3/uL (ref 150–400)
RBC: 3.62 MIL/uL — ABNORMAL LOW (ref 4.22–5.81)
RDW: 16.7 % — ABNORMAL HIGH (ref 11.5–15.5)
WBC: 9 10*3/uL (ref 4.0–10.5)
nRBC: 0 % (ref 0.0–0.2)

## 2020-04-23 LAB — IRON AND TIBC
Iron: 41 ug/dL — ABNORMAL LOW (ref 45–182)
Saturation Ratios: 13 % — ABNORMAL LOW (ref 17.9–39.5)
TIBC: 306 ug/dL (ref 250–450)
UIBC: 265 ug/dL

## 2020-04-23 LAB — POCT HEMOGLOBIN-HEMACUE: Hemoglobin: 9.4 g/dL — ABNORMAL LOW (ref 13.0–17.0)

## 2020-04-23 LAB — VITAMIN D 25 HYDROXY (VIT D DEFICIENCY, FRACTURES): Vit D, 25-Hydroxy: 29.96 ng/mL — ABNORMAL LOW (ref 30–100)

## 2020-04-23 LAB — PROTEIN / CREATININE RATIO, URINE
Creatinine, Urine: 70.19 mg/dL
Protein Creatinine Ratio: 1.72 mg/mg{Cre} — ABNORMAL HIGH (ref 0.00–0.15)
Total Protein, Urine: 121 mg/dL

## 2020-04-23 MED ORDER — EPOETIN ALFA-EPBX 10000 UNIT/ML IJ SOLN: 4000.0000 [IU] | Freq: Once | INTRAMUSCULAR | Status: DC

## 2020-04-23 MED ORDER — EPOETIN ALFA-EPBX 2000 UNIT/ML IJ SOLN
INTRAMUSCULAR | Status: AC
  Filled 2020-04-23: qty 2

## 2020-04-23 MED ORDER — EPOETIN ALFA-EPBX 2000 UNIT/ML IJ SOLN
2000.0000 [IU] | Freq: Once | INTRAMUSCULAR | Status: AC
  Administered 2020-04-23: 2000 [IU] via SUBCUTANEOUS

## 2020-04-23 NOTE — Telephone Encounter (Signed)
Facility called stating UA was normal. No cath PVR done.

## 2020-04-23 NOTE — Patient Outreach (Signed)
Danny [name] [facility]) Care Management  04/23/2020  [name] [birth year] [id number]   Referral Date: 04/22/20 Referral Source: [employer] Report Date of Discharge: 15/50/27 Facility: [place]:  [place] attempt:No answer.  HIPAA compliant voice message left.    Plan: RN CM will attempt again within 4 business days.  [name], RN, MSN [place] Management Care Management Coordinator Direct Line [phone] Cell [phone] Toll Free: [phone]  Fax: [phone]

## 2020-04-24 DIAGNOSIS — S81812D Laceration without foreign body, left lower leg, subsequent encounter: Secondary | ICD-10-CM | POA: Diagnosis not present

## 2020-04-24 DIAGNOSIS — R269 Unspecified abnormalities of gait and mobility: Secondary | ICD-10-CM | POA: Diagnosis not present

## 2020-04-24 LAB — PARATHYROID HORMONE, INTACT (NO CA): PTH: 50 pg/mL (ref 15–65)

## 2020-04-26 ENCOUNTER — Other Ambulatory Visit: Payer: Self-pay

## 2020-04-26 NOTE — Patient Outreach (Signed)
[facility]) Care Management  04/26/2020  Danny [name] May 18, 1956 [id number]   Referral Date:04/22/20 Referral Source:[employer] Report Date of VSYVGCYOY:[birth year] [name] attempt:No answer. HIPAA compliant voice message left.   Plan: RN CM willattempt again in the month of November.   [name], RN, MSN [place] Management Care Management Coordinator Direct Line [phone] Cell [phone] Toll Free: [phone]  Fax: [phone]

## 2020-04-28 ENCOUNTER — Ambulatory Visit: Payer: Self-pay

## 2020-04-28 DIAGNOSIS — I5032 Chronic diastolic (congestive) heart failure: Secondary | ICD-10-CM | POA: Diagnosis not present

## 2020-04-28 DIAGNOSIS — R809 Proteinuria, unspecified: Secondary | ICD-10-CM | POA: Diagnosis not present

## 2020-04-28 DIAGNOSIS — N185 Chronic kidney disease, stage 5: Secondary | ICD-10-CM | POA: Diagnosis not present

## 2020-04-28 DIAGNOSIS — D631 Anemia in chronic kidney disease: Secondary | ICD-10-CM | POA: Diagnosis not present

## 2020-04-28 DIAGNOSIS — D509 Iron deficiency anemia, unspecified: Secondary | ICD-10-CM | POA: Diagnosis not present

## 2020-04-28 DIAGNOSIS — I129 Hypertensive chronic kidney disease with stage 1 through stage 4 chronic kidney disease, or unspecified chronic kidney disease: Secondary | ICD-10-CM | POA: Diagnosis not present

## 2020-04-28 DIAGNOSIS — E559 Vitamin D deficiency, unspecified: Secondary | ICD-10-CM | POA: Diagnosis not present

## 2020-04-28 DIAGNOSIS — E1129 Type 2 diabetes mellitus with other diabetic kidney complication: Secondary | ICD-10-CM | POA: Diagnosis not present

## 2020-04-28 DIAGNOSIS — E1122 Type 2 diabetes mellitus with diabetic chronic kidney disease: Secondary | ICD-10-CM | POA: Diagnosis not present

## 2020-05-07 ENCOUNTER — Encounter (HOSPITAL_COMMUNITY)
Admission: RE | Admit: 2020-05-07 | Discharge: 2020-05-07 | Disposition: A | Discharge status: Home / Self Care | Payer: Medicare HMO | Source: Ambulatory Visit | Attending: Nephrology | Admitting: Nephrology

## 2020-05-07 ENCOUNTER — Encounter (HOSPITAL_COMMUNITY): Payer: Self-pay

## 2020-05-07 ENCOUNTER — Other Ambulatory Visit: Payer: Self-pay

## 2020-05-07 DIAGNOSIS — N189 Chronic kidney disease, unspecified: Secondary | ICD-10-CM | POA: Insufficient documentation

## 2020-05-07 DIAGNOSIS — D631 Anemia in chronic kidney disease: Secondary | ICD-10-CM | POA: Insufficient documentation

## 2020-05-07 LAB — POCT HEMOGLOBIN-HEMACUE: Hemoglobin: 9 g/dL — ABNORMAL LOW (ref 13.0–17.0)

## 2020-05-07 MED ORDER — EPOETIN ALFA-EPBX 2000 UNIT/ML IJ SOLN
2000.0000 [IU] | Freq: Once | INTRAMUSCULAR | Status: AC
  Administered 2020-05-07: 2000 [IU] via SUBCUTANEOUS

## 2020-05-08 MED ORDER — EPOETIN ALFA-EPBX 2000 UNIT/ML IJ SOLN
INTRAMUSCULAR | Status: AC
  Filled 2020-05-08: qty 2

## 2020-05-13 ENCOUNTER — Other Ambulatory Visit: Payer: Self-pay

## 2020-05-13 NOTE — Patient Outreach (Signed)
[facility]) Care Management  05/13/2020  Danny [name] 1956-02-02 [id number]  Referral Date:04/22/20 Referral Source:[employer] Report Date of DPOEUMPNT:61/44/31 [place] attempt:No answer. HIPAA compliant voice message left.   Plan: RN CM willclose case.  [name], RN, MSN [place] Management Care Management Coordinator Direct Line [phone] Cell [phone] Toll Free: [phone]  Fax: [phone]

## 2020-05-14 DIAGNOSIS — Z125 Encounter for screening for malignant neoplasm of prostate: Secondary | ICD-10-CM | POA: Diagnosis not present

## 2020-05-14 DIAGNOSIS — S31501A Unspecified open wound of unspecified external genital organs, male, initial encounter: Secondary | ICD-10-CM | POA: Diagnosis not present

## 2020-05-14 DIAGNOSIS — L89513 Pressure ulcer of right ankle, stage 3: Secondary | ICD-10-CM | POA: Diagnosis not present

## 2020-05-14 DIAGNOSIS — E11649 Type 2 diabetes mellitus with hypoglycemia without coma: Secondary | ICD-10-CM | POA: Diagnosis not present

## 2020-05-14 DIAGNOSIS — E559 Vitamin D deficiency, unspecified: Secondary | ICD-10-CM | POA: Diagnosis not present

## 2020-05-14 DIAGNOSIS — J329 Chronic sinusitis, unspecified: Secondary | ICD-10-CM | POA: Diagnosis not present

## 2020-05-14 DIAGNOSIS — B372 Candidiasis of skin and nail: Secondary | ICD-10-CM | POA: Diagnosis not present

## 2020-05-14 DIAGNOSIS — R269 Unspecified abnormalities of gait and mobility: Secondary | ICD-10-CM | POA: Diagnosis not present

## 2020-05-14 DIAGNOSIS — L97909 Non-pressure chronic ulcer of unspecified part of unspecified lower leg with unspecified severity: Secondary | ICD-10-CM | POA: Diagnosis not present

## 2020-05-14 DIAGNOSIS — Z0000 Encounter for general adult medical examination without abnormal findings: Secondary | ICD-10-CM | POA: Diagnosis not present

## 2020-05-14 DIAGNOSIS — L89512 Pressure ulcer of right ankle, stage 2: Secondary | ICD-10-CM | POA: Diagnosis not present

## 2020-05-14 DIAGNOSIS — Z712 Person consulting for explanation of examination or test findings: Secondary | ICD-10-CM | POA: Diagnosis not present

## 2020-05-14 DIAGNOSIS — S81812D Laceration without foreign body, left lower leg, subsequent encounter: Secondary | ICD-10-CM | POA: Diagnosis not present

## 2020-05-14 DIAGNOSIS — R070 Pain in throat: Secondary | ICD-10-CM | POA: Diagnosis not present

## 2020-05-19 DIAGNOSIS — L409 Psoriasis, unspecified: Secondary | ICD-10-CM | POA: Diagnosis not present

## 2020-05-19 DIAGNOSIS — I739 Peripheral vascular disease, unspecified: Secondary | ICD-10-CM | POA: Diagnosis not present

## 2020-05-19 DIAGNOSIS — Z0001 Encounter for general adult medical examination with abnormal findings: Secondary | ICD-10-CM | POA: Diagnosis not present

## 2020-05-19 DIAGNOSIS — E1122 Type 2 diabetes mellitus with diabetic chronic kidney disease: Secondary | ICD-10-CM | POA: Diagnosis not present

## 2020-05-19 DIAGNOSIS — M109 Gout, unspecified: Secondary | ICD-10-CM | POA: Diagnosis not present

## 2020-05-19 DIAGNOSIS — D631 Anemia in chronic kidney disease: Secondary | ICD-10-CM | POA: Diagnosis not present

## 2020-05-19 DIAGNOSIS — E782 Mixed hyperlipidemia: Secondary | ICD-10-CM | POA: Diagnosis not present

## 2020-05-19 DIAGNOSIS — N184 Chronic kidney disease, stage 4 (severe): Secondary | ICD-10-CM | POA: Diagnosis not present

## 2020-05-19 DIAGNOSIS — I129 Hypertensive chronic kidney disease with stage 1 through stage 4 chronic kidney disease, or unspecified chronic kidney disease: Secondary | ICD-10-CM | POA: Diagnosis not present

## 2020-05-19 DIAGNOSIS — E1165 Type 2 diabetes mellitus with hyperglycemia: Secondary | ICD-10-CM | POA: Diagnosis not present

## 2020-05-24 ENCOUNTER — Encounter (HOSPITAL_COMMUNITY)
Admission: RE | Admit: 2020-05-24 | Discharge: 2020-05-24 | Disposition: A | Discharge status: Home / Self Care | Payer: Medicare HMO | Source: Ambulatory Visit | Attending: Nephrology | Admitting: Nephrology

## 2020-05-24 ENCOUNTER — Other Ambulatory Visit: Payer: Self-pay

## 2020-05-24 DIAGNOSIS — N189 Chronic kidney disease, unspecified: Secondary | ICD-10-CM | POA: Diagnosis not present

## 2020-05-24 DIAGNOSIS — D631 Anemia in chronic kidney disease: Secondary | ICD-10-CM | POA: Diagnosis not present

## 2020-05-24 LAB — RENAL FUNCTION PANEL
Albumin: 3 g/dL — ABNORMAL LOW (ref 3.5–5.0)
Anion gap: 8 (ref 5–15)
BUN: 50 mg/dL — ABNORMAL HIGH (ref 8–23)
CO2: 22 mmol/L (ref 22–32)
Calcium: 8.5 mg/dL — ABNORMAL LOW (ref 8.9–10.3)
Chloride: 107 mmol/L (ref 98–111)
Creatinine, Ser: 3.72 mg/dL — ABNORMAL HIGH (ref 0.61–1.24)
GFR, Estimated: 17 mL/min — ABNORMAL LOW (ref >60)
Glucose, Bld: 179 mg/dL — ABNORMAL HIGH (ref 70–99)
Phosphorus: 4.9 mg/dL — ABNORMAL HIGH (ref 2.5–4.6)
Potassium: 4 mmol/L (ref 3.5–5.1)
Sodium: 137 mmol/L (ref 135–145)

## 2020-05-24 LAB — CBC
HCT: 26.3 % — ABNORMAL LOW (ref 39.0–52.0)
Hemoglobin: 8.3 g/dL — ABNORMAL LOW (ref 13.0–17.0)
MCH: 26.3 pg (ref 26.0–34.0)
MCHC: 31.6 g/dL (ref 30.0–36.0)
MCV: 83.5 fL (ref 80.0–100.0)
Platelets: 211 10*3/uL (ref 150–400)
RBC: 3.15 MIL/uL — ABNORMAL LOW (ref 4.22–5.81)
RDW: 18 % — ABNORMAL HIGH (ref 11.5–15.5)
WBC: 8.5 10*3/uL (ref 4.0–10.5)
nRBC: 0 % (ref 0.0–0.2)

## 2020-05-24 LAB — IRON AND TIBC
Iron: 46 ug/dL (ref 45–182)
Saturation Ratios: 16 % — ABNORMAL LOW (ref 17.9–39.5)
TIBC: 294 ug/dL (ref 250–450)
UIBC: 248 ug/dL

## 2020-05-24 LAB — POCT HEMOGLOBIN-HEMACUE: Hemoglobin: 8.6 g/dL — ABNORMAL LOW (ref 13.0–17.0)

## 2020-05-24 MED ORDER — EPOETIN ALFA-EPBX 2000 UNIT/ML IJ SOLN
2000.0000 [IU] | Freq: Once | INTRAMUSCULAR | Status: AC
  Administered 2020-05-24: 2000 [IU] via SUBCUTANEOUS

## 2020-05-24 MED ORDER — EPOETIN ALFA-EPBX 2000 UNIT/ML IJ SOLN
INTRAMUSCULAR | Status: AC
  Filled 2020-05-24: qty 2

## 2020-05-25 ENCOUNTER — Encounter (HOSPITAL_COMMUNITY)
Admission: RE | Admit: 2020-05-25 | Discharge: 2020-05-25 | Disposition: A | Discharge status: Home / Self Care | Payer: Medicare HMO | Source: Ambulatory Visit | Attending: Nephrology | Admitting: Nephrology

## 2020-05-25 DIAGNOSIS — N189 Chronic kidney disease, unspecified: Secondary | ICD-10-CM | POA: Diagnosis not present

## 2020-05-25 DIAGNOSIS — D631 Anemia in chronic kidney disease: Secondary | ICD-10-CM | POA: Diagnosis not present

## 2020-05-25 LAB — PROTEIN / CREATININE RATIO, URINE
Creatinine, Urine: 49.97 mg/dL
Protein Creatinine Ratio: 2.86 mg/mg{Cre} — ABNORMAL HIGH (ref 0.00–0.15)
Total Protein, Urine: 143 mg/dL

## 2020-06-02 DIAGNOSIS — I5032 Chronic diastolic (congestive) heart failure: Secondary | ICD-10-CM | POA: Diagnosis not present

## 2020-06-02 DIAGNOSIS — R809 Proteinuria, unspecified: Secondary | ICD-10-CM | POA: Diagnosis not present

## 2020-06-02 DIAGNOSIS — D509 Iron deficiency anemia, unspecified: Secondary | ICD-10-CM | POA: Diagnosis not present

## 2020-06-02 DIAGNOSIS — E1129 Type 2 diabetes mellitus with other diabetic kidney complication: Secondary | ICD-10-CM | POA: Diagnosis not present

## 2020-06-02 DIAGNOSIS — N189 Chronic kidney disease, unspecified: Secondary | ICD-10-CM | POA: Diagnosis not present

## 2020-06-02 DIAGNOSIS — I129 Hypertensive chronic kidney disease with stage 1 through stage 4 chronic kidney disease, or unspecified chronic kidney disease: Secondary | ICD-10-CM | POA: Diagnosis not present

## 2020-06-02 DIAGNOSIS — E1122 Type 2 diabetes mellitus with diabetic chronic kidney disease: Secondary | ICD-10-CM | POA: Diagnosis not present

## 2020-06-02 DIAGNOSIS — R972 Elevated prostate specific antigen [PSA]: Secondary | ICD-10-CM | POA: Diagnosis not present

## 2020-06-02 DIAGNOSIS — D631 Anemia in chronic kidney disease: Secondary | ICD-10-CM | POA: Diagnosis not present

## 2020-06-07 ENCOUNTER — Encounter (HOSPITAL_COMMUNITY)
Admission: RE | Admit: 2020-06-07 | Discharge: 2020-06-07 | Disposition: A | Discharge status: Home / Self Care | Payer: Medicare HMO | Source: Ambulatory Visit | Attending: Nephrology | Admitting: Nephrology

## 2020-06-07 ENCOUNTER — Other Ambulatory Visit: Payer: Self-pay

## 2020-06-07 ENCOUNTER — Encounter (HOSPITAL_COMMUNITY): Payer: Self-pay

## 2020-06-07 DIAGNOSIS — D631 Anemia in chronic kidney disease: Secondary | ICD-10-CM | POA: Diagnosis not present

## 2020-06-07 DIAGNOSIS — N184 Chronic kidney disease, stage 4 (severe): Secondary | ICD-10-CM | POA: Insufficient documentation

## 2020-06-07 LAB — POCT HEMOGLOBIN-HEMACUE: Hemoglobin: 8.5 g/dL — ABNORMAL LOW (ref 13.0–17.0)

## 2020-06-07 MED ORDER — EPOETIN ALFA-EPBX 10000 UNIT/ML IJ SOLN: 4000.0000 [IU] | Freq: Once | INTRAMUSCULAR | Status: DC

## 2020-06-07 MED ORDER — EPOETIN ALFA-EPBX 2000 UNIT/ML IJ SOLN
2000.0000 [IU] | Freq: Once | INTRAMUSCULAR | Status: AC
  Administered 2020-06-07: 2000 [IU] via SUBCUTANEOUS

## 2020-06-07 MED ORDER — EPOETIN ALFA-EPBX 2000 UNIT/ML IJ SOLN
INTRAMUSCULAR | Status: AC
  Filled 2020-06-07: qty 2

## 2020-06-09 ENCOUNTER — Other Ambulatory Visit: Payer: Self-pay

## 2020-06-09 ENCOUNTER — Ambulatory Visit (INDEPENDENT_AMBULATORY_CARE_PROVIDER_SITE_OTHER): Payer: Medicare HMO | Admitting: Urology

## 2020-06-09 VITALS — BP 165/77 | HR 89 | Temp 98.2°F | Ht 73.0 in | Wt 282.0 lb

## 2020-06-09 DIAGNOSIS — R339 Retention of urine, unspecified: Secondary | ICD-10-CM | POA: Diagnosis not present

## 2020-06-09 DIAGNOSIS — N401 Enlarged prostate with lower urinary tract symptoms: Secondary | ICD-10-CM | POA: Diagnosis not present

## 2020-06-09 DIAGNOSIS — N3001 Acute cystitis with hematuria: Secondary | ICD-10-CM

## 2020-06-09 DIAGNOSIS — N138 Other obstructive and reflux uropathy: Secondary | ICD-10-CM

## 2020-06-09 LAB — MICROSCOPIC EXAMINATION
Epithelial Cells (non renal): NONE SEEN /hpf (ref 0–10)
Renal Epithel, UA: NONE SEEN /hpf

## 2020-06-09 LAB — URINALYSIS, ROUTINE W REFLEX MICROSCOPIC
Bilirubin, UA: NEGATIVE
Ketones, UA: NEGATIVE
Nitrite, UA: NEGATIVE
Specific Gravity, UA: 1.02 (ref 1.005–1.030)
Urobilinogen, Ur: 0.2 mg/dL (ref 0.2–1.0)
pH, UA: 7 (ref 5.0–7.5)

## 2020-06-09 LAB — BLADDER SCAN AMB NON-IMAGING: Scan Result: 98

## 2020-06-09 MED ORDER — NITROFURANTOIN MONOHYD MACRO 100 MG PO CAPS: 100.0000 mg | ORAL_CAPSULE | Freq: Two times a day (BID) | ORAL | 0 refills | Status: DC

## 2020-06-09 NOTE — Progress Notes (Signed)
06/09/2020 11:41 AM   Danny [name] 03-12-1956 [id number]  Referring provider: Celene Squibb, MD [address] [name],  [place] [address]  followup urinary retention and BPH  HPI: Danny [name] is a 57FM here for followup for BPH and Urinary retention. He underwent TURP 12/2019. PVR 98cc. PSa increased to 7.2 this year. He denies any significant LUTS. No hematuria or dysuria.   PMH: Past Medical History:  Diagnosis Date  . Anemia   . Arthritis   . CKD (chronic kidney disease), stage IV ([place])   . Diabetes mellitus without complication ([place])   . Foot ulcer ([place])   . Hypertension   . Urinary retention     Surgical History: Past Surgical History:  Procedure Laterality Date  . ANKLE SURGERY Right   . CHOLECYSTECTOMY    . FOOT SURGERY Right   . TRANSURETHRAL RESECTION OF PROSTATE N/A 01/15/2020   Procedure: TRANSURETHRAL RESECTION OF THE PROSTATE (TURP)  with General anesthesia and spinal;  Surgeon: Cleon Gustin, MD;  Location: [facility];  Service: Urology;  Laterality: N/A;    Home Medications:  Allergies as of 06/09/2020      Reactions   Dust Mite Extract Itching, Other (See Comments)   Unknown reaction-potential shortness of breath   Prednisone Nausea And Vomiting   Rocephin [ceftriaxone] Nausea And Vomiting      Medication List       Accurate as of June 09, 2020 11:41 AM. If you have any questions, ask your nurse or doctor.        Accu-Chek SmartView test strip Generic drug: glucose blood 1 each by Other route as needed.   acetaminophen 500 MG tablet Commonly known as: TYLENOL Take 1,000 mg by mouth every 6 (six) hours as needed for moderate pain or headache.   amLODipine 10 MG tablet Commonly known as: NORVASC Take 10 mg by mouth daily.   cholecalciferol 25 MCG (1000 UNIT) tablet Commonly known as: VITAMIN D3 Take 1,000 Units by mouth daily.   Darbepoetin Alfa 150 MCG/0.3ML Sosy injection Commonly known as: ARANESP Inject 0.3 mLs (150 mcg  total) into the skin every Saturday at 6 PM.   FISH OIL PO Take 2,400 mg by mouth 2 (two) times daily.   fluticasone 50 MCG/ACT nasal spray Commonly known as: FLONASE Place 2 sprays into both nostrils daily as needed for allergies.   Ipratropium-Albuterol 20-100 MCG/ACT Aers respimat Commonly known as: COMBIVENT Inhale 1 puff into the lungs every 6 (six) hours as needed for wheezing or shortness of breath.   ketotifen 0.025 % ophthalmic solution Commonly known as: Allergy Eye Drops Place 1 drop into both eyes daily as needed (allergies/dry eyes).   Magnesium 100 MG Caps Take 100 mg by mouth daily.   metoprolol succinate 100 MG 24 hr tablet Commonly known as: TOPROL-XL Take 1 tablet (100 mg total) by mouth every evening.   multivitamin with minerals Tabs tablet Take 1 tablet by mouth daily.   NovoLIN 70/30 ReliOn (70-30) 100 UNIT/ML injection Generic drug: insulin NPH-regular Human Inject 20-100 Units into the skin in the morning and at bedtime.   pravastatin 80 MG tablet Commonly known as: PRAVACHOL Take 1 tablet (80 mg total) by mouth every evening.   Retacrit 4000 UNIT/ML injection Generic drug: epoetin alfa-epbx 4,000 Units every 14 (fourteen) days.   torsemide 20 MG tablet Commonly known as: DEMADEX Take 2 tablets by mouth daily; okay to use extra 20 mg daily if patient gain more than 3  pounds overnight or more than 5 pounds in a week.   vitamin B-12 1000 MCG tablet Commonly known as: CYANOCOBALAMIN Take 1,000 mcg by mouth daily.   Vitamin C 500 MG Chew Chew 1,000 mg by mouth daily.       Allergies:  Allergies  Allergen Reactions  . Dust Mite Extract Itching and Other (See Comments)    Unknown reaction-potential shortness of breath  . Prednisone Nausea And Vomiting  . Rocephin [Ceftriaxone] Nausea And Vomiting    Family History: Family History  Problem Relation Age of Onset  . Diabetes Mother   . Heart attack Mother   . Heart attack Father   .  Diabetes Brother     Social History:  reports that he has never smoked. He has never used smokeless tobacco. He reports that he does not drink alcohol and does not use drugs.  ROS: All other review of systems were reviewed and are negative except what is noted above in HPI  Physical Exam: BP (!) 165/77   Pulse 89   Temp 98.2 F (36.8 C)   Ht 6\' 1"  (1.854 m)   Wt 282 lb (127.9 kg)   BMI 37.21 kg/m   Constitutional:  Alert and oriented, No acute distress. HEENT: [place] AT, moist mucus membranes.  Trachea midline, no masses. Cardiovascular: No clubbing, cyanosis, or edema. Respiratory: Normal respiratory effort, no increased work of breathing. GI: Abdomen is soft, nontender, nondistended, no abdominal masses GU: No CVA tenderness.  Lymph: No cervical or inguinal lymphadenopathy. Skin: No rashes, bruises or suspicious lesions. Neurologic: Grossly intact, no focal deficits, moving all 4 extremities. Psychiatric: Normal mood and affect.  Laboratory Data: Lab Results  Component Value Date   WBC 8.5 05/24/2020   HGB 8.5 (L) 06/07/2020   HCT 26.3 (L) 05/24/2020   MCV 83.5 05/24/2020   PLT 211 05/24/2020    Lab Results  Component Value Date   CREATININE 3.72 (H) 05/24/2020    No results found for: PSA  No results found for: TESTOSTERONE  Lab Results  Component Value Date   HGBA1C 7.1 (H) 03/24/2020    Urinalysis    Component Value Date/Time   COLORURINE YELLOW 03/23/2020 1622   APPEARANCEUR Clear 06/09/2020 1118   LABSPEC 1.010 03/23/2020 1622   PHURINE 5.0 03/23/2020 1622   GLUCOSEU 1+ (A) 06/09/2020 1118   HGBUR LARGE (A) 03/23/2020 1622   BILIRUBINUR Negative 06/09/2020 1118   KETONESUR NEGATIVE 03/23/2020 1622   PROTEINUR 3+ (A) 06/09/2020 1118   PROTEINUR >=300 (A) 03/23/2020 1622   NITRITE Negative 06/09/2020 1118   NITRITE NEGATIVE 03/23/2020 1622   LEUKOCYTESUR Trace (A) 06/09/2020 1118   LEUKOCYTESUR TRACE (A) 03/23/2020 1622    Lab Results   Component Value Date   LABMICR See below: 06/09/2020   WBCUA 6-10 (A) 06/09/2020   LABEPIT None seen 06/09/2020   MUCUS Present 06/09/2020   BACTERIA Few 06/09/2020    Pertinent Imaging:  No results found for this or any previous visit.  No results found for this or any previous visit.  No results found for this or any previous visit.  No results found for this or any previous visit.  Results for orders placed during the hospital encounter of 07/07/19  US RENAL  Narrative CLINICAL DATA:  Acute kidney injury.  EXAM: RENAL / URINARY TRACT ULTRASOUND COMPLETE  COMPARISON:  Renal ultrasound 02/24/2019  FINDINGS: Right Kidney:  Renal measurements: 10.9 x 5.4 x 5.8 cm = volume: 182 mL. No hydronephrosis. Mildly  increased renal cortical echogenicity. Redemonstrated exophytic simple appearing interpolar cyst measuring 2.4 x 2.4 x 2.5 cm.  Left Kidney:  Renal measurements: 11.8 x 6.1 x 4.9 cm = volume: 184 mL. No hydronephrosis. Mildly increased renal cortical echogenicity.  Bladder:  The bladder is collapsed around a Foley catheter.  Other:  Small volume pelvic ascites.  IMPRESSION: Increased renal cortical echogenicity bilaterally, suggestive of chronic renal parenchymal disease.  2.5 cm simple appearing right renal cyst.  No hydronephrosis.  Bladder collapsed around Foley catheter.  Small volume pelvic ascites.   Electronically Signed By: Kellie Simmering DO On: 07/08/2019 09:25  No results found for this or any previous visit.  No results found for this or any previous visit.  Results for orders placed during the hospital encounter of 01/22/20  CT Renal Stone Study  Narrative CLINICAL DATA:  Flank pain.  EXAM: CT ABDOMEN AND PELVIS WITHOUT CONTRAST  TECHNIQUE: Multidetector CT imaging of the abdomen and pelvis was performed following the standard protocol without IV contrast.  COMPARISON:  February 28, 2019  FINDINGS: Lower chest: Very  mild atelectasis is seen within the bilateral lung bases.  Small bilateral pleural effusions are seen, right greater than left.  Hepatobiliary: No focal liver abnormality is seen. Status post cholecystectomy. No biliary dilatation.  Pancreas: Unremarkable. No pancreatic ductal dilatation or surrounding inflammatory changes.  Spleen: Normal in size without focal abnormality.  Adrenals/Urinary Tract: The right adrenal gland is normal in appearance. A 3.4 cm x 3.1 cm low-attenuation left adrenal mass is seen. Kidneys are normal in size. A 2.7 cm exophytic cyst is seen along the anterolateral aspect of the mid right kidney. A subcentimeter left renal cyst is noted. A stable, approximately 1.0 cm ill-defined mildly hyperdense focus is seen within the anteromedial aspect of the mid left kidney (axial CT image 40, CT series number 2). 2 mm and 3 mm nonobstructing renal stones are seen within the right kidney. Additional 2 mm nonobstructing renal stones are seen within the lower pole of the left kidney.  A moderate to marked amount of heterogeneous hyperdense material is seen within the dependent portion of a moderately distended urinary bladder. A mild amount of intraluminal air is seen as well as the distal tip and insufflator bulb of a Foley catheter. A mild amount of inflammatory fat stranding is seen anterior to the urinary bladder. No urinary bladder wall thickening is noted.  Stomach/Bowel: Stomach is within normal limits. The appendix is not clearly identified. No evidence of bowel wall thickening, distention, or inflammatory changes.  Vascular/Lymphatic: There is mild to moderate severity calcification of the abdominal aorta and bilateral common iliac arteries. No enlarged abdominal or pelvic lymph nodes.  Reproductive: There is very mild enlargement of the prostate gland.  Other: A 2.7 cm x 1.8 cm fat containing right inguinal hernia is seen. An additional 4.5 cm x 4.1 cm  fat containing left inguinal hernia is noted.  No free fluid is seen.  Musculoskeletal: Mild subcutaneous inflammatory fat stranding is seen along the midline of the anterior pelvic wall.  Moderate severity multilevel degenerative changes are noted throughout the lumbar spine with vacuum disc phenomenon seen at the levels of L2-L3 and L3-L4.  IMPRESSION: 1. Moderate to marked amount of heterogeneous hyperdense material within the dependent portion of a moderately distended urinary bladder. While this is suspicious for hemorrhagic material, the presence of an underlying neoplastic process cannot be excluded. Correlation with cystoscopy is recommended. 2. Bilateral nonobstructing renal stones. 3. Stable low-attenuation  left adrenal mass which may represent an adrenal adenoma. 4. Small bilateral pleural effusions, right greater than left. 5. Bilateral fat containing inguinal hernias. 6. Mild to moderate severity calcification of the abdominal aorta and bilateral common iliac arteries.  Aortic Atherosclerosis (ICD10-I70.0).   Electronically Signed By: Virgina Norfolk M.D. On: 01/22/2020 09:40   Assessment & Plan:    1. Urinary retention -resolved - BLADDER SCAN AMB NON-IMAGING - Urinalysis, Routine w reflex microscopic  2. Benign prostatic hyperplasia with urinary obstruction -resolved  3. Acute cystitis -urine for culture, will call with results -macrobid 100mg  BID for 7 days   No follow-ups on file.  Nicolette Bang, MD  [facility] [place]

## 2020-06-09 NOTE — Patient Instructions (Signed)

## 2020-06-09 NOTE — Progress Notes (Signed)
Bladder Scan Patient can void: 98 ml Performed By: Kamariah Fruchter,lpn    Urological Symptom Review  Patient is experiencing the following symptoms: Frequent urination Burning/pain with urination Leakage of urine   Review of Systems  Gastrointestinal (upper)  : Negative for upper GI symptoms  Gastrointestinal (lower) : Constipation  Constitutional : Negative for symptoms  Skin: Negative for skin symptoms  Eyes: Blurred vision  Ear/Nose/Throat : Sinus problems  Hematologic/Lymphatic: Negative for Hematologic/Lymphatic symptoms  Cardiovascular : Negative for cardiovascular symptoms  Respiratory : Negative for respiratory symptoms  Endocrine: Negative for endocrine symptoms  Musculoskeletal: Negative for musculoskeletal symptoms  Neurological: Negative for neurological symptoms  Psychologic: Negative for psychiatric symptoms

## 2020-06-14 ENCOUNTER — Encounter: Payer: Self-pay | Admitting: Urology

## 2020-06-16 ENCOUNTER — Telehealth: Payer: Self-pay

## 2020-06-16 DIAGNOSIS — R339 Retention of urine, unspecified: Secondary | ICD-10-CM

## 2020-06-16 DIAGNOSIS — N390 Urinary tract infection, site not specified: Secondary | ICD-10-CM

## 2020-06-16 LAB — URINE CULTURE

## 2020-06-16 MED ORDER — CEFUROXIME AXETIL 500 MG PO TABS: 500.0000 mg | ORAL_TABLET | Freq: Two times a day (BID) | ORAL | 0 refills | Status: AC

## 2020-06-16 NOTE — Telephone Encounter (Signed)
-----   Message from Cleon Gustin, MD sent at 06/16/2020 11:33 AM EST ----- Ceftin 500mg  BID for 7 days ----- Message ----- From: Iris Pert, LPN Sent: 41/59/7331   8:35 AM EST To: Cleon Gustin, MD  Resistant to Macrobid started on 12/15 Please review

## 2020-06-16 NOTE — Telephone Encounter (Signed)
Pt called and made aware

## 2020-06-22 ENCOUNTER — Other Ambulatory Visit: Payer: Self-pay

## 2020-06-22 ENCOUNTER — Encounter (HOSPITAL_COMMUNITY)
Admission: RE | Admit: 2020-06-22 | Discharge: 2020-06-22 | Disposition: A | Discharge status: Home / Self Care | Payer: Medicare HMO | Source: Ambulatory Visit | Attending: Nephrology | Admitting: Nephrology

## 2020-06-22 ENCOUNTER — Encounter (HOSPITAL_COMMUNITY): Payer: Self-pay

## 2020-06-22 DIAGNOSIS — N184 Chronic kidney disease, stage 4 (severe): Secondary | ICD-10-CM | POA: Diagnosis not present

## 2020-06-22 DIAGNOSIS — D631 Anemia in chronic kidney disease: Secondary | ICD-10-CM | POA: Diagnosis not present

## 2020-06-22 LAB — POCT HEMOGLOBIN-HEMACUE: Hemoglobin: 8.1 g/dL — ABNORMAL LOW (ref 13.0–17.0)

## 2020-06-22 MED ORDER — EPOETIN ALFA-EPBX 2000 UNIT/ML IJ SOLN
2000.0000 [IU] | Freq: Once | INTRAMUSCULAR | Status: AC
  Administered 2020-06-22: 2000 [IU] via SUBCUTANEOUS
  Filled 2020-06-22: qty 1

## 2020-06-23 ENCOUNTER — Telehealth: Payer: Self-pay

## 2020-06-23 NOTE — Telephone Encounter (Signed)
-----   Message from Cleon Gustin, MD sent at 06/22/2020 11:09 AM EST ----- Monural 3g x 2 doses 2 days apart ----- Message ----- From: Iris Pert, LPN Sent: 44/65/2076   5:28 PM EST To: Cleon Gustin, MD  Pt has allergy to Rocephin [Ceftriaxone] reaction is nausea/vomiting. Shall I proceed?  ----- Message ----- From: Cleon Gustin, MD Sent: 06/16/2020  11:33 AM EST To: Iris Pert, LPN  Ceftin 500mg  BID for 7 days ----- Message ----- From: Iris Pert, LPN Sent: 19/15/5027   8:35 AM EST To: Cleon Gustin, MD  Resistant to Macrobid started on 12/15 Please review

## 2020-06-23 NOTE — Telephone Encounter (Signed)
Pt was started on Ceftin 12/22 and pt is to remain on Ceftin and not start Monaural per Dr. Alyson Ingles

## 2020-07-06 ENCOUNTER — Encounter (HOSPITAL_COMMUNITY)
Admission: RE | Admit: 2020-07-06 | Discharge: 2020-07-06 | Disposition: A | Discharge status: Home / Self Care | Payer: Medicare HMO | Source: Ambulatory Visit | Attending: Nephrology | Admitting: Nephrology

## 2020-07-06 ENCOUNTER — Other Ambulatory Visit: Payer: Self-pay

## 2020-07-06 DIAGNOSIS — N184 Chronic kidney disease, stage 4 (severe): Secondary | ICD-10-CM | POA: Diagnosis not present

## 2020-07-06 LAB — RENAL FUNCTION PANEL
Albumin: 2.3 g/dL — ABNORMAL LOW (ref 3.5–5.0)
Anion gap: 9 (ref 5–15)
BUN: 49 mg/dL — ABNORMAL HIGH (ref 8–23)
CO2: 20 mmol/L — ABNORMAL LOW (ref 22–32)
Calcium: 8.7 mg/dL — ABNORMAL LOW (ref 8.9–10.3)
Chloride: 105 mmol/L (ref 98–111)
Creatinine, Ser: 3.34 mg/dL — ABNORMAL HIGH (ref 0.61–1.24)
GFR, Estimated: 20 mL/min — ABNORMAL LOW (ref >60)
Glucose, Bld: 243 mg/dL — ABNORMAL HIGH (ref 70–99)
Phosphorus: 4.5 mg/dL (ref 2.5–4.6)
Potassium: 3.9 mmol/L (ref 3.5–5.1)
Sodium: 134 mmol/L — ABNORMAL LOW (ref 135–145)

## 2020-07-06 LAB — IRON AND TIBC
Iron: 32 ug/dL — ABNORMAL LOW (ref 45–182)
Saturation Ratios: 16 % — ABNORMAL LOW (ref 17.9–39.5)
TIBC: 203 ug/dL — ABNORMAL LOW (ref 250–450)
UIBC: 171 ug/dL

## 2020-07-06 LAB — CBC
HCT: 27.1 % — ABNORMAL LOW (ref 39.0–52.0)
Hemoglobin: 8.6 g/dL — ABNORMAL LOW (ref 13.0–17.0)
MCH: 25 pg — ABNORMAL LOW (ref 26.0–34.0)
MCHC: 31.7 g/dL (ref 30.0–36.0)
MCV: 78.8 fL — ABNORMAL LOW (ref 80.0–100.0)
Platelets: 296 10*3/uL (ref 150–400)
RBC: 3.44 MIL/uL — ABNORMAL LOW (ref 4.22–5.81)
RDW: 17.9 % — ABNORMAL HIGH (ref 11.5–15.5)
WBC: 8.7 10*3/uL (ref 4.0–10.5)
nRBC: 0 % (ref 0.0–0.2)

## 2020-07-06 LAB — POCT HEMOGLOBIN-HEMACUE: Hemoglobin: 8.4 g/dL — ABNORMAL LOW (ref 13.0–17.0)

## 2020-07-06 LAB — FERRITIN: Ferritin: 169 ng/mL (ref 24–336)

## 2020-07-06 LAB — PROTEIN / CREATININE RATIO, URINE
Creatinine, Urine: 52.52 mg/dL
Protein Creatinine Ratio: 5.65 mg/mg{Cre} — ABNORMAL HIGH (ref 0.00–0.15)
Total Protein, Urine: 297 mg/dL

## 2020-07-06 MED ORDER — EPOETIN ALFA-EPBX 2000 UNIT/ML IJ SOLN
2000.0000 [IU] | Freq: Once | INTRAMUSCULAR | Status: AC
  Administered 2020-07-06: 2000 [IU] via SUBCUTANEOUS

## 2020-07-06 MED ORDER — EPOETIN ALFA-EPBX 2000 UNIT/ML IJ SOLN
INTRAMUSCULAR | Status: AC
  Filled 2020-07-06: qty 2

## 2020-07-14 DIAGNOSIS — E872 Acidosis: Secondary | ICD-10-CM | POA: Diagnosis not present

## 2020-07-14 DIAGNOSIS — D631 Anemia in chronic kidney disease: Secondary | ICD-10-CM | POA: Diagnosis not present

## 2020-07-14 DIAGNOSIS — D509 Iron deficiency anemia, unspecified: Secondary | ICD-10-CM | POA: Diagnosis not present

## 2020-07-14 DIAGNOSIS — I5032 Chronic diastolic (congestive) heart failure: Secondary | ICD-10-CM | POA: Diagnosis not present

## 2020-07-14 DIAGNOSIS — E871 Hypo-osmolality and hyponatremia: Secondary | ICD-10-CM | POA: Diagnosis not present

## 2020-07-14 DIAGNOSIS — N189 Chronic kidney disease, unspecified: Secondary | ICD-10-CM | POA: Diagnosis not present

## 2020-07-14 DIAGNOSIS — I129 Hypertensive chronic kidney disease with stage 1 through stage 4 chronic kidney disease, or unspecified chronic kidney disease: Secondary | ICD-10-CM | POA: Diagnosis not present

## 2020-07-14 DIAGNOSIS — E1122 Type 2 diabetes mellitus with diabetic chronic kidney disease: Secondary | ICD-10-CM | POA: Diagnosis not present

## 2020-07-20 ENCOUNTER — Other Ambulatory Visit (HOSPITAL_COMMUNITY): Payer: Medicare HMO

## 2020-07-20 ENCOUNTER — Encounter (HOSPITAL_COMMUNITY)
Admission: RE | Admit: 2020-07-20 | Discharge: 2020-07-20 | Disposition: A | Discharge status: Home / Self Care | Payer: Medicare HMO | Source: Ambulatory Visit | Attending: Nephrology | Admitting: Nephrology

## 2020-07-20 ENCOUNTER — Inpatient Hospital Stay (HOSPITAL_COMMUNITY): Admission: RE | Admit: 2020-07-20 | Payer: Medicare HMO | Source: Ambulatory Visit

## 2020-07-30 NOTE — Progress Notes (Signed)
CARDIOLOGY CONSULT NOTE       Patient ID: Mung Rinker MRN: [id number] DOB/AGE: 12-14-1955 65 y.o.  Admit date: (Not on file) Referring Physician: Nevada Crane Primary Physician: Celene Squibb, MD Primary Cardiologist: New Reason for Consultation: CHF  Active Problems:   * No active hospital problems. *   HPI:  65 y.o. referred by Dr Nevada Crane for CHF. History of DM, HTN, CKD Has prostatism and had TURP July 2021  Still with some incontinence issues Post TURP hospitalized with volume overload 03/30/20 urinary retention Required self cath, more dyspnea and decreased UO. Diagnosed with diastolic CHF and bronchitis He was  D/c on 67 mg of Torsemide daily as well as 2L oxygen CT showed pleural effusions, cirrhosis and dilated PA He was COvID negative. No BNP done Baseline Cr around 4 most recently 3.34 on 07/06/20  Last TTE done 02/24/19 EF >65% mild LVH ? Restrictive filling Estimated PA 63 mmHg  He has been barely ambulatory since his prostate surgery Needs more rehab Using wheel chair and walker Chronic LE edema but urinates a lot with diuretics   ROS All other systems reviewed and negative except as noted above  Past Medical History:  Diagnosis Date  . Anemia   . Arthritis   . CKD (chronic kidney disease), stage IV ([place])   . Diabetes mellitus without complication ([place])   . Foot ulcer ([place])   . Hypertension   . Urinary retention     Family History  Problem Relation Age of Onset  . Diabetes Mother   . Heart attack Mother   . Heart attack Father   . Diabetes Brother     Social History   Socioeconomic History  . Marital status: Divorced    Spouse name: Not on file  . Number of children: Not on file  . Years of education: Not on file  . Highest education level: Not on file  Occupational History  . Not on file  Tobacco Use  . Smoking status: Never Smoker  . Smokeless tobacco: Never Used  Vaping Use  . Vaping Use: Never used  Substance and Sexual Activity  . Alcohol use: No   . Drug use: No  . Sexual activity: Not Currently  Other Topics Concern  . Not on file  Social History Narrative  . Not on file   Social Determinants of Health   Financial Resource Strain: Not on file  Food Insecurity: Not on file  Transportation Needs: Not on file  Physical Activity: Not on file  Stress: Not on file  Social Connections: Not on file  Intimate Partner Violence: Not on file    Past Surgical History:  Procedure Laterality Date  . ANKLE SURGERY Right   . CHOLECYSTECTOMY    . FOOT SURGERY Right   . TRANSURETHRAL RESECTION OF PROSTATE N/A 01/15/2020   Procedure: TRANSURETHRAL RESECTION OF THE PROSTATE (TURP)  with General anesthesia and spinal;  Surgeon: Cleon Gustin, MD;  Location: [facility];  Service: Urology;  Laterality: N/A;      Current Outpatient Medications:  .  ACCU-CHEK SMARTVIEW test strip, 1 each by Other route as needed. , Disp: , Rfl:  .  acetaminophen (TYLENOL) 500 MG tablet, Take 1,000 mg by mouth every 6 (six) hours as needed for moderate pain or headache., Disp: , Rfl:  .  amLODipine (NORVASC) 10 MG tablet, Take 10 mg by mouth daily. , Disp: , Rfl:  .  Ascorbic Acid (VITAMIN C) 500 MG CHEW, Chew 1,000 mg by  mouth daily., Disp: , Rfl:  .  cholecalciferol (VITAMIN D3) 25 MCG (1000 UNIT) tablet, Take 1,000 Units by mouth daily., Disp: , Rfl:  .  epoetin alfa-epbx (RETACRIT) 4000 UNIT/ML injection, 4,000 Units every 14 (fourteen) days., Disp: , Rfl:  .  ketotifen (ALLERGY EYE DROPS) 0.025 % ophthalmic solution, Place 1 drop into both eyes daily as needed (allergies/dry eyes)., Disp: , Rfl:  .  Magnesium 100 MG CAPS, Take 100 mg by mouth daily. , Disp: , Rfl:  .  metoprolol succinate (TOPROL-XL) 100 MG 24 hr tablet, Take 1 tablet (100 mg total) by mouth every evening., Disp: 30 tablet, Rfl: 0 .  Multiple Vitamin (MULTIVITAMIN WITH MINERALS) TABS tablet, Take 1 tablet by mouth daily., Disp: , Rfl:  .  NOVOLIN 70/30 RELION (70-30) 100 UNIT/ML injection,  Inject 20-100 Units into the skin in the morning and at bedtime. , Disp: , Rfl:  .  Omega-3 Fatty Acids (FISH OIL PO), Take 2,400 mg by mouth 2 (two) times daily., Disp: , Rfl:  .  pravastatin (PRAVACHOL) 80 MG tablet, Take 1 tablet (80 mg total) by mouth every evening., Disp: 30 tablet, Rfl: 0 .  torsemide (DEMADEX) 20 MG tablet, Take 2 tablets by mouth daily; okay to use extra 20 mg daily if patient gain more than 3 pounds overnight or more than 5 pounds in a week., Disp: , Rfl:  .  vitamin B-12 (CYANOCOBALAMIN) 1000 MCG tablet, Take 1,000 mcg by mouth daily., Disp: , Rfl:  No current facility-administered medications for this visit.  Facility-Administered Medications Ordered in Other Visits:  .  0.9 %  sodium chloride infusion, , Intravenous, Once, Bhutani, Manpreet S, MD .  iron sucrose (VENOFER) 200 mg in sodium chloride 0.9 % 100 mL IVPB, 200 mg, Intravenous, Once, Bhutani, Manpreet S, MD    Physical Exam: Blood pressure (!) 142/70, pulse 71, height 6\' 1"  (1.854 m), weight 131 kg, SpO2 91 %.   Affect appropriate Chronically ill white male in wheel chair  HEENT: normal Neck supple with no adenopathy JVP normal no bruits no thyromegaly Lungs clear with no wheezing and good diaphragmatic motion Heart:  S1/S2 no murmur, no rub, gallop or click PMI normal Abdomen: benighn, BS positve, no tenderness, no AAA no bruit.  No HSM or HJR Distal pulses intact with no bruits Plus 2 brawny chronic edema with some skin breakdown  Neuro non-focal Skin warm and dry No muscular weakness   Labs:   Lab Results  Component Value Date   WBC 7.4 08/05/2020   HGB 7.7 (L) 08/05/2020   HCT 24.7 (L) 08/05/2020   MCV 84.0 08/05/2020   PLT 212 08/05/2020    Recent Labs  Lab 08/05/20 1320  NA 134*  K 3.9  CL 105  CO2 20*  BUN 62*  CREATININE 3.42*  CALCIUM 8.6*  GLUCOSE 259*   Lab Results  Component Value Date   TROPONINI <0.03 01/20/2018    Lab Results  Component Value Date   CHOL  95 02/26/2019   CHOL 151 12/26/2017   Lab Results  Component Value Date   HDL 26 (L) 02/26/2019   HDL 32 (L) 12/26/2017   Lab Results  Component Value Date   LDLCALC 46 02/26/2019   LDLCALC 101 (H) 12/26/2017   Lab Results  Component Value Date   TRIG 113 02/26/2019   TRIG 91 12/26/2017   Lab Results  Component Value Date   CHOLHDL 3.7 02/26/2019   CHOLHDL 4.7 12/26/2017  No results found for: LDLDIRECT    Radiology: No results found.  EKG: SR rate 56 normal 03/23/20    ASSESSMENT AND PLAN:   1. CHF:  I think this is mainly HTN, DM and renal failure leading to volume overload not primarily and cardiac issue will order lexiscan myovue to r/o CAD as he is likely headed to dialysis and would be nice to know low risk scan Before this  2. DM:  Discussed low carb diet.  Target hemoglobin A1c is 6.5 or less.  Continue current medications. 3. CRF:  Needs to f/u with nephrology would seem to be nearing need for dialysis given symptomatic volume overload  4. HTN:  Well controlled.  Continue current medications and low sodium Dash type diet.   5. Anemia:  Due to renal failure on aranesp and iron shots here at [facility] Will have one today   Lexiscan myovue F/U PRN   Signed: Jenkins Rouge 08/09/2020, 10:04 AM

## 2020-08-02 ENCOUNTER — Encounter (HOSPITAL_COMMUNITY): Admission: RE | Admit: 2020-08-02 | Payer: Medicare HMO | Source: Ambulatory Visit

## 2020-08-03 ENCOUNTER — Encounter (HOSPITAL_COMMUNITY): Payer: Self-pay

## 2020-08-03 ENCOUNTER — Encounter (HOSPITAL_COMMUNITY)
Admission: RE | Admit: 2020-08-03 | Discharge: 2020-08-03 | Disposition: A | Discharge status: Home / Self Care | Payer: Medicare HMO | Source: Ambulatory Visit | Attending: Nephrology | Admitting: Nephrology

## 2020-08-03 ENCOUNTER — Other Ambulatory Visit: Payer: Self-pay

## 2020-08-03 DIAGNOSIS — D631 Anemia in chronic kidney disease: Secondary | ICD-10-CM | POA: Diagnosis not present

## 2020-08-03 DIAGNOSIS — Z20822 Contact with and (suspected) exposure to covid-19: Secondary | ICD-10-CM | POA: Insufficient documentation

## 2020-08-03 DIAGNOSIS — N184 Chronic kidney disease, stage 4 (severe): Secondary | ICD-10-CM | POA: Diagnosis present

## 2020-08-03 LAB — POCT HEMOGLOBIN-HEMACUE: Hemoglobin: 7.5 g/dL — ABNORMAL LOW (ref 13.0–17.0)

## 2020-08-03 MED ORDER — SODIUM CHLORIDE 0.9 % IV SOLN
200.0000 mg | Freq: Once | INTRAVENOUS | Status: AC
  Administered 2020-08-03: 200 mg via INTRAVENOUS
  Filled 2020-08-03: qty 10

## 2020-08-03 MED ORDER — SODIUM CHLORIDE 0.9 % IV SOLN: Freq: Once | INTRAVENOUS | Status: AC

## 2020-08-03 MED ORDER — EPOETIN ALFA-EPBX 2000 UNIT/ML IJ SOLN
2000.0000 [IU] | Freq: Once | INTRAMUSCULAR | Status: AC
  Administered 2020-08-03: 2000 [IU] via SUBCUTANEOUS

## 2020-08-03 MED ORDER — EPOETIN ALFA-EPBX 2000 UNIT/ML IJ SOLN
2000.0000 [IU] | Freq: Once | INTRAMUSCULAR | Status: AC
Start: 2020-08-03 — End: 2020-08-03
  Administered 2020-08-03: 2000 [IU] via SUBCUTANEOUS

## 2020-08-03 MED ORDER — EPOETIN ALFA-EPBX 2000 UNIT/ML IJ SOLN
INTRAMUSCULAR | Status: AC
  Filled 2020-08-03: qty 2

## 2020-08-04 ENCOUNTER — Encounter (HOSPITAL_COMMUNITY)
Admission: RE | Admit: 2020-08-04 | Discharge: 2020-08-04 | Disposition: A | Discharge status: Home / Self Care | Payer: Medicare HMO | Source: Ambulatory Visit | Attending: Nephrology | Admitting: Nephrology

## 2020-08-04 MED ORDER — SODIUM CHLORIDE 0.9 % IV SOLN: Freq: Once | INTRAVENOUS | Status: DC

## 2020-08-04 MED ORDER — SODIUM CHLORIDE 0.9 % IV SOLN
200.0000 mg | Freq: Once | INTRAVENOUS | Status: DC
  Filled 2020-08-04: qty 10

## 2020-08-05 ENCOUNTER — Encounter (HOSPITAL_COMMUNITY)
Admission: RE | Admit: 2020-08-05 | Discharge: 2020-08-05 | Disposition: A | Discharge status: Home / Self Care | Payer: Medicare HMO | Source: Ambulatory Visit | Attending: Nephrology | Admitting: Nephrology

## 2020-08-05 ENCOUNTER — Other Ambulatory Visit: Payer: Self-pay

## 2020-08-05 ENCOUNTER — Encounter (HOSPITAL_COMMUNITY): Payer: Self-pay

## 2020-08-05 DIAGNOSIS — N184 Chronic kidney disease, stage 4 (severe): Secondary | ICD-10-CM | POA: Diagnosis not present

## 2020-08-05 DIAGNOSIS — D631 Anemia in chronic kidney disease: Secondary | ICD-10-CM | POA: Diagnosis not present

## 2020-08-05 DIAGNOSIS — Z20822 Contact with and (suspected) exposure to covid-19: Secondary | ICD-10-CM | POA: Diagnosis not present

## 2020-08-05 LAB — RENAL FUNCTION PANEL
Albumin: 2.6 g/dL — ABNORMAL LOW (ref 3.5–5.0)
Anion gap: 9 (ref 5–15)
BUN: 62 mg/dL — ABNORMAL HIGH (ref 8–23)
CO2: 20 mmol/L — ABNORMAL LOW (ref 22–32)
Calcium: 8.6 mg/dL — ABNORMAL LOW (ref 8.9–10.3)
Chloride: 105 mmol/L (ref 98–111)
Creatinine, Ser: 3.42 mg/dL — ABNORMAL HIGH (ref 0.61–1.24)
GFR, Estimated: 19 mL/min — ABNORMAL LOW (ref >60)
Glucose, Bld: 259 mg/dL — ABNORMAL HIGH (ref 70–99)
Phosphorus: 4.7 mg/dL — ABNORMAL HIGH (ref 2.5–4.6)
Potassium: 3.9 mmol/L (ref 3.5–5.1)
Sodium: 134 mmol/L — ABNORMAL LOW (ref 135–145)

## 2020-08-05 LAB — CBC
HCT: 24.7 % — ABNORMAL LOW (ref 39.0–52.0)
Hemoglobin: 7.7 g/dL — ABNORMAL LOW (ref 13.0–17.0)
MCH: 26.2 pg (ref 26.0–34.0)
MCHC: 31.2 g/dL (ref 30.0–36.0)
MCV: 84 fL (ref 80.0–100.0)
Platelets: 212 10*3/uL (ref 150–400)
RBC: 2.94 MIL/uL — ABNORMAL LOW (ref 4.22–5.81)
RDW: 20.7 % — ABNORMAL HIGH (ref 11.5–15.5)
WBC: 7.4 10*3/uL (ref 4.0–10.5)
nRBC: 0 % (ref 0.0–0.2)

## 2020-08-05 MED ORDER — SODIUM CHLORIDE 0.9 % IV SOLN
200.0000 mg | Freq: Once | INTRAVENOUS | Status: AC
  Administered 2020-08-05: 200 mg via INTRAVENOUS
  Filled 2020-08-05: qty 10

## 2020-08-05 MED ORDER — SODIUM CHLORIDE 0.9 % IV SOLN: Freq: Once | INTRAVENOUS | Status: AC

## 2020-08-06 ENCOUNTER — Encounter (HOSPITAL_COMMUNITY): Payer: Self-pay

## 2020-08-06 ENCOUNTER — Encounter (HOSPITAL_COMMUNITY)
Admission: RE | Admit: 2020-08-06 | Discharge: 2020-08-06 | Disposition: A | Discharge status: Home / Self Care | Payer: Medicare HMO | Source: Ambulatory Visit | Attending: Nephrology | Admitting: Nephrology

## 2020-08-06 DIAGNOSIS — N184 Chronic kidney disease, stage 4 (severe): Secondary | ICD-10-CM | POA: Diagnosis not present

## 2020-08-06 DIAGNOSIS — Z20822 Contact with and (suspected) exposure to covid-19: Secondary | ICD-10-CM | POA: Diagnosis not present

## 2020-08-06 DIAGNOSIS — D631 Anemia in chronic kidney disease: Secondary | ICD-10-CM | POA: Diagnosis not present

## 2020-08-06 MED ORDER — SODIUM CHLORIDE 0.9 % IV SOLN
200.0000 mg | Freq: Once | INTRAVENOUS | Status: AC
  Administered 2020-08-06: 200 mg via INTRAVENOUS
  Filled 2020-08-06: qty 10

## 2020-08-06 MED ORDER — SODIUM CHLORIDE 0.9 % IV SOLN: Freq: Once | INTRAVENOUS | Status: AC

## 2020-08-09 ENCOUNTER — Encounter (HOSPITAL_COMMUNITY): Payer: Self-pay

## 2020-08-09 ENCOUNTER — Ambulatory Visit: Payer: Medicare HMO | Admitting: Cardiovascular Disease

## 2020-08-09 ENCOUNTER — Encounter: Payer: Self-pay | Admitting: *Deleted

## 2020-08-09 ENCOUNTER — Encounter: Payer: Self-pay | Admitting: Cardiovascular Disease

## 2020-08-09 ENCOUNTER — Encounter (HOSPITAL_COMMUNITY)
Admission: RE | Admit: 2020-08-09 | Discharge: 2020-08-09 | Disposition: A | Discharge status: Home / Self Care | Payer: Medicare HMO | Source: Ambulatory Visit | Attending: Nephrology | Admitting: Nephrology

## 2020-08-09 ENCOUNTER — Other Ambulatory Visit: Payer: Self-pay

## 2020-08-09 VITALS — BP 142/70 | HR 71 | Ht 73.0 in | Wt 288.8 lb

## 2020-08-09 DIAGNOSIS — N184 Chronic kidney disease, stage 4 (severe): Secondary | ICD-10-CM | POA: Diagnosis not present

## 2020-08-09 DIAGNOSIS — I5033 Acute on chronic diastolic (congestive) heart failure: Secondary | ICD-10-CM

## 2020-08-09 DIAGNOSIS — D631 Anemia in chronic kidney disease: Secondary | ICD-10-CM | POA: Diagnosis not present

## 2020-08-09 DIAGNOSIS — Z20822 Contact with and (suspected) exposure to covid-19: Secondary | ICD-10-CM | POA: Diagnosis not present

## 2020-08-09 LAB — PROTEIN / CREATININE RATIO, URINE
Creatinine, Urine: 45.76 mg/dL
Protein Creatinine Ratio: 4.57 mg/mg{Cre} — ABNORMAL HIGH (ref 0.00–0.15)
Total Protein, Urine: 209 mg/dL

## 2020-08-09 MED ORDER — SODIUM CHLORIDE 0.9 % IV SOLN
200.0000 mg | Freq: Once | INTRAVENOUS | Status: AC
  Administered 2020-08-09: 200 mg via INTRAVENOUS
  Filled 2020-08-09: qty 10

## 2020-08-09 MED ORDER — SODIUM CHLORIDE 0.9 % IV SOLN: Freq: Once | INTRAVENOUS | Status: AC

## 2020-08-09 NOTE — Patient Instructions (Signed)
Medication Instructions:  Your physician recommends that you continue on your current medications as directed. Please refer to the Current Medication list given to you today.  *If you need a refill on your cardiac medications before your next appointment, please call your pharmacy*   Lab Work: NONE  If you have labs (blood work) drawn today and your tests are completely normal, you will receive your results only by: . MyChart Message (if you have MyChart) OR . A paper copy in the mail If you have any lab test that is abnormal or we need to change your treatment, we will call you to review the results.   Testing/Procedures: Your physician has requested that you have a lexiscan myoview. For further information please visit [email or web address]. Please follow instruction sheet, as given.  Follow-Up: At [facility], you and your health needs are our priority.  As part of our continuing mission to provide you with exceptional heart care, we have created designated Provider Care Teams.  These Care Teams include your primary Cardiologist (physician) and Advanced Practice Providers (APPs -  Physician Assistants and Nurse Practitioners) who all work together to provide you with the care you need, when you need it.  We recommend signing up for the patient portal called "MyChart".  Sign up information is provided on this After Visit Summary.  MyChart is used to connect with patients for Virtual Visits (Telemedicine).  Patients are able to view lab/test results, encounter notes, upcoming appointments, etc.  Non-urgent messages can be sent to your provider as well.   To learn more about what you can do with MyChart, go to [email or web address].    Your next appointment:    As Needed   The format for your next appointment:   In Person  Provider:   Peter Nishan, MD   Other Instructions Thank you for choosing [facility]!    

## 2020-08-10 ENCOUNTER — Encounter (HOSPITAL_COMMUNITY)
Admission: RE | Admit: 2020-08-10 | Discharge: 2020-08-10 | Disposition: A | Discharge status: Home / Self Care | Payer: Medicare HMO | Source: Ambulatory Visit | Attending: Nephrology | Admitting: Nephrology

## 2020-08-10 MED ORDER — SODIUM CHLORIDE 0.9 % IV SOLN: Freq: Once | INTRAVENOUS | Status: DC

## 2020-08-10 MED ORDER — SODIUM CHLORIDE 0.9 % IV SOLN
200.0000 mg | Freq: Once | INTRAVENOUS | Status: DC
  Filled 2020-08-10: qty 10

## 2020-08-12 DIAGNOSIS — Z20822 Contact with and (suspected) exposure to covid-19: Secondary | ICD-10-CM | POA: Diagnosis not present

## 2020-08-12 DIAGNOSIS — D509 Iron deficiency anemia, unspecified: Secondary | ICD-10-CM | POA: Diagnosis not present

## 2020-08-12 DIAGNOSIS — N189 Chronic kidney disease, unspecified: Secondary | ICD-10-CM | POA: Diagnosis not present

## 2020-08-12 DIAGNOSIS — E1122 Type 2 diabetes mellitus with diabetic chronic kidney disease: Secondary | ICD-10-CM | POA: Diagnosis not present

## 2020-08-12 DIAGNOSIS — I129 Hypertensive chronic kidney disease with stage 1 through stage 4 chronic kidney disease, or unspecified chronic kidney disease: Secondary | ICD-10-CM | POA: Diagnosis not present

## 2020-08-12 DIAGNOSIS — D631 Anemia in chronic kidney disease: Secondary | ICD-10-CM | POA: Diagnosis not present

## 2020-08-12 DIAGNOSIS — I5032 Chronic diastolic (congestive) heart failure: Secondary | ICD-10-CM | POA: Diagnosis not present

## 2020-08-16 ENCOUNTER — Encounter (HOSPITAL_COMMUNITY): Payer: Medicare HMO

## 2020-08-17 ENCOUNTER — Encounter (HOSPITAL_COMMUNITY): Payer: Self-pay

## 2020-08-17 ENCOUNTER — Other Ambulatory Visit: Payer: Self-pay

## 2020-08-17 ENCOUNTER — Encounter (HOSPITAL_COMMUNITY)
Admission: RE | Admit: 2020-08-17 | Discharge: 2020-08-17 | Disposition: A | Discharge status: Home / Self Care | Payer: Medicare HMO | Source: Ambulatory Visit | Attending: Nephrology | Admitting: Nephrology

## 2020-08-17 DIAGNOSIS — Z20822 Contact with and (suspected) exposure to covid-19: Secondary | ICD-10-CM | POA: Diagnosis not present

## 2020-08-17 DIAGNOSIS — N184 Chronic kidney disease, stage 4 (severe): Secondary | ICD-10-CM | POA: Diagnosis not present

## 2020-08-17 DIAGNOSIS — D631 Anemia in chronic kidney disease: Secondary | ICD-10-CM | POA: Diagnosis not present

## 2020-08-17 LAB — RENAL FUNCTION PANEL
Albumin: 2.8 g/dL — ABNORMAL LOW (ref 3.5–5.0)
Anion gap: 9 (ref 5–15)
BUN: 45 mg/dL — ABNORMAL HIGH (ref 8–23)
CO2: 20 mmol/L — ABNORMAL LOW (ref 22–32)
Calcium: 8.8 mg/dL — ABNORMAL LOW (ref 8.9–10.3)
Chloride: 108 mmol/L (ref 98–111)
Creatinine, Ser: 3.69 mg/dL — ABNORMAL HIGH (ref 0.61–1.24)
GFR, Estimated: 17 mL/min — ABNORMAL LOW (ref >60)
Glucose, Bld: 198 mg/dL — ABNORMAL HIGH (ref 70–99)
Phosphorus: 4.3 mg/dL (ref 2.5–4.6)
Potassium: 4.4 mmol/L (ref 3.5–5.1)
Sodium: 137 mmol/L (ref 135–145)

## 2020-08-17 LAB — IRON AND TIBC
Iron: 48 ug/dL (ref 45–182)
Saturation Ratios: 17 % — ABNORMAL LOW (ref 17.9–39.5)
TIBC: 280 ug/dL (ref 250–450)
UIBC: 232 ug/dL

## 2020-08-17 LAB — CBC
HCT: 25.4 % — ABNORMAL LOW (ref 39.0–52.0)
Hemoglobin: 7.9 g/dL — ABNORMAL LOW (ref 13.0–17.0)
MCH: 27.3 pg (ref 26.0–34.0)
MCHC: 31.1 g/dL (ref 30.0–36.0)
MCV: 87.9 fL (ref 80.0–100.0)
Platelets: 196 10*3/uL (ref 150–400)
RBC: 2.89 MIL/uL — ABNORMAL LOW (ref 4.22–5.81)
RDW: 21.2 % — ABNORMAL HIGH (ref 11.5–15.5)
WBC: 8 10*3/uL (ref 4.0–10.5)
nRBC: 0 % (ref 0.0–0.2)

## 2020-08-17 LAB — FERRITIN: Ferritin: 400 ng/mL — ABNORMAL HIGH (ref 24–336)

## 2020-08-17 LAB — SARS CORONAVIRUS 2 (TAT 6-24 HRS): SARS Coronavirus 2: NEGATIVE

## 2020-08-17 LAB — POCT HEMOGLOBIN-HEMACUE: Hemoglobin: 8 g/dL — ABNORMAL LOW (ref 13.0–17.0)

## 2020-08-17 MED ORDER — EPOETIN ALFA-EPBX 10000 UNIT/ML IJ SOLN
8000.0000 [IU] | Freq: Once | INTRAMUSCULAR | Status: AC
  Administered 2020-08-17: 8000 [IU] via SUBCUTANEOUS

## 2020-08-17 MED ORDER — EPOETIN ALFA-EPBX 2000 UNIT/ML IJ SOLN: 2000.0000 [IU] | Freq: Once | INTRAMUSCULAR | Status: DC

## 2020-08-17 MED ORDER — EPOETIN ALFA 4000 UNIT/ML IJ SOLN
INTRAMUSCULAR | Status: AC
  Filled 2020-08-17: qty 2

## 2020-08-17 MED ORDER — SODIUM CHLORIDE 0.9 % IV SOLN: Freq: Once | INTRAVENOUS | Status: AC

## 2020-08-17 MED ORDER — EPOETIN ALFA-EPBX 10000 UNIT/ML IJ SOLN
INTRAMUSCULAR | Status: AC
  Filled 2020-08-17: qty 1

## 2020-08-17 MED ORDER — SODIUM CHLORIDE 0.9 % IV SOLN
200.0000 mg | Freq: Once | INTRAVENOUS | Status: AC
  Administered 2020-08-17: 200 mg via INTRAVENOUS
  Filled 2020-08-17: qty 10

## 2020-08-17 NOTE — Progress Notes (Signed)
Patient stated to me that he was supposed to get a covid test "sometime", as advised by Dr Theador Hawthorne, however he has not done so.  Has felt "very bad" over the last week and has had to cancel appointments due to the fact that he is so weak and has had a dry cough that keeps him up at night.  Spoke to Tracy in Dr. Toya Smothers office to make her aware.  She stated that [name] was advised to contact Dr. Nevada Crane, his PCP and have a covid test done last Friday or Saturday.  Patient declines following instruction.  Verbal order obtained from Dr Theador Hawthorne for COVID test and was performed by myself.  Called [facility] to advise of situation and that patient needed to cancel upcoming testing for tomorrow until further notice.  Advised patient to go straight home and await test results.  Verbalized understanding.

## 2020-08-18 ENCOUNTER — Encounter (HOSPITAL_COMMUNITY): Payer: Medicare HMO

## 2020-08-20 DIAGNOSIS — D509 Iron deficiency anemia, unspecified: Secondary | ICD-10-CM | POA: Diagnosis not present

## 2020-08-20 DIAGNOSIS — I5032 Chronic diastolic (congestive) heart failure: Secondary | ICD-10-CM | POA: Diagnosis not present

## 2020-08-20 DIAGNOSIS — N189 Chronic kidney disease, unspecified: Secondary | ICD-10-CM | POA: Diagnosis not present

## 2020-08-20 DIAGNOSIS — E1122 Type 2 diabetes mellitus with diabetic chronic kidney disease: Secondary | ICD-10-CM | POA: Diagnosis not present

## 2020-08-20 DIAGNOSIS — D631 Anemia in chronic kidney disease: Secondary | ICD-10-CM | POA: Diagnosis not present

## 2020-08-20 DIAGNOSIS — I129 Hypertensive chronic kidney disease with stage 1 through stage 4 chronic kidney disease, or unspecified chronic kidney disease: Secondary | ICD-10-CM | POA: Diagnosis not present

## 2020-08-31 ENCOUNTER — Encounter (HOSPITAL_COMMUNITY)
Admission: RE | Admit: 2020-08-31 | Discharge: 2020-08-31 | Disposition: A | Discharge status: Home / Self Care | Payer: Medicare HMO | Source: Ambulatory Visit | Attending: Nephrology | Admitting: Nephrology

## 2020-08-31 ENCOUNTER — Encounter (HOSPITAL_COMMUNITY): Payer: Self-pay

## 2020-08-31 ENCOUNTER — Other Ambulatory Visit: Payer: Self-pay

## 2020-08-31 DIAGNOSIS — D631 Anemia in chronic kidney disease: Secondary | ICD-10-CM | POA: Diagnosis not present

## 2020-08-31 DIAGNOSIS — N184 Chronic kidney disease, stage 4 (severe): Secondary | ICD-10-CM | POA: Diagnosis present

## 2020-08-31 LAB — POCT HEMOGLOBIN-HEMACUE: Hemoglobin: 8 g/dL — ABNORMAL LOW (ref 13.0–17.0)

## 2020-08-31 MED ORDER — EPOETIN ALFA-EPBX 10000 UNIT/ML IJ SOLN
8000.0000 [IU] | Freq: Once | INTRAMUSCULAR | Status: AC
  Administered 2020-08-31: 8000 [IU] via SUBCUTANEOUS
  Filled 2020-08-31: qty 1

## 2020-09-03 DIAGNOSIS — E1129 Type 2 diabetes mellitus with other diabetic kidney complication: Secondary | ICD-10-CM | POA: Diagnosis not present

## 2020-09-03 DIAGNOSIS — E1122 Type 2 diabetes mellitus with diabetic chronic kidney disease: Secondary | ICD-10-CM | POA: Diagnosis not present

## 2020-09-03 DIAGNOSIS — R809 Proteinuria, unspecified: Secondary | ICD-10-CM | POA: Diagnosis not present

## 2020-09-03 DIAGNOSIS — I5032 Chronic diastolic (congestive) heart failure: Secondary | ICD-10-CM | POA: Diagnosis not present

## 2020-09-03 DIAGNOSIS — N189 Chronic kidney disease, unspecified: Secondary | ICD-10-CM | POA: Diagnosis not present

## 2020-09-03 DIAGNOSIS — D631 Anemia in chronic kidney disease: Secondary | ICD-10-CM | POA: Diagnosis not present

## 2020-09-03 DIAGNOSIS — D509 Iron deficiency anemia, unspecified: Secondary | ICD-10-CM | POA: Diagnosis not present

## 2020-09-03 DIAGNOSIS — I129 Hypertensive chronic kidney disease with stage 1 through stage 4 chronic kidney disease, or unspecified chronic kidney disease: Secondary | ICD-10-CM | POA: Diagnosis not present

## 2020-09-10 ENCOUNTER — Other Ambulatory Visit: Payer: Self-pay

## 2020-09-10 ENCOUNTER — Encounter: Payer: Self-pay | Admitting: Urology

## 2020-09-10 ENCOUNTER — Ambulatory Visit (INDEPENDENT_AMBULATORY_CARE_PROVIDER_SITE_OTHER): Payer: Medicare HMO | Admitting: Urology

## 2020-09-10 VITALS — BP 168/93 | HR 81 | Temp 97.6°F

## 2020-09-10 DIAGNOSIS — N138 Other obstructive and reflux uropathy: Secondary | ICD-10-CM

## 2020-09-10 DIAGNOSIS — R339 Retention of urine, unspecified: Secondary | ICD-10-CM

## 2020-09-10 DIAGNOSIS — R972 Elevated prostate specific antigen [PSA]: Secondary | ICD-10-CM | POA: Diagnosis not present

## 2020-09-10 DIAGNOSIS — N401 Enlarged prostate with lower urinary tract symptoms: Secondary | ICD-10-CM | POA: Diagnosis not present

## 2020-09-10 LAB — URINALYSIS, ROUTINE W REFLEX MICROSCOPIC
Bilirubin, UA: NEGATIVE
Ketones, UA: NEGATIVE
Leukocytes,UA: NEGATIVE
Nitrite, UA: NEGATIVE
Specific Gravity, UA: 1.02 (ref 1.005–1.030)
Urobilinogen, Ur: 0.2 mg/dL (ref 0.2–1.0)
pH, UA: 7 (ref 5.0–7.5)

## 2020-09-10 LAB — MICROSCOPIC EXAMINATION
Bacteria, UA: NONE SEEN
Epithelial Cells (non renal): NONE SEEN /hpf (ref 0–10)
Renal Epithel, UA: NONE SEEN /hpf
WBC, UA: NONE SEEN /hpf (ref 0–5)

## 2020-09-10 LAB — BLADDER SCAN AMB NON-IMAGING: Scan Result: 48

## 2020-09-10 NOTE — Patient Instructions (Signed)

## 2020-09-10 NOTE — Progress Notes (Signed)
09/10/2020 11:25 AM   Danny [name] 1956/03/12 [id number]  Referring provider: Celene Squibb, MD [address] [name],  [place] [address]  followup BPH  HPI: [name] is a 65yo here for followup for BPH and urinary retention. Since last visit his GFR has continued to decrease and last creatinine was 3.7. Per patient is going to see a vascular surgeon for dialysis access. He continue to be fatigue and weak. He has chronic anemia and is scheduled to see hematology. He has a good stream.  He has mild LUTS since his TURP.    PMH: Past Medical History:  Diagnosis Date  . Anemia   . Arthritis   . CKD (chronic kidney disease), stage IV ([place])   . Diabetes mellitus without complication ([place])   . Foot ulcer ([place])   . Hypertension   . Urinary retention     Surgical History: Past Surgical History:  Procedure Laterality Date  . ANKLE SURGERY Right   . CHOLECYSTECTOMY    . FOOT SURGERY Right   . TRANSURETHRAL RESECTION OF PROSTATE N/A 01/15/2020   Procedure: TRANSURETHRAL RESECTION OF THE PROSTATE (TURP)  with General anesthesia and spinal;  Surgeon: Cleon Gustin, MD;  Location: [facility];  Service: Urology;  Laterality: N/A;    Home Medications:  Allergies as of 09/10/2020      Reactions   Dust Mite Extract Itching, Other (See Comments)   Unknown reaction-potential shortness of breath   Prednisone Nausea And Vomiting   Rocephin [ceftriaxone] Nausea And Vomiting      Medication List       Accurate as of September 10, 2020 11:25 AM. If you have any questions, ask your nurse or doctor.        Accu-Chek SmartView test strip Generic drug: glucose blood 1 each by Other route as needed.   acetaminophen 500 MG tablet Commonly known as: TYLENOL Take 1,000 mg by mouth every 6 (six) hours as needed for moderate pain or headache.   amLODipine 10 MG tablet Commonly known as: NORVASC Take 10 mg by mouth daily.   aspirin 325 MG tablet Take 325 mg by mouth daily.    cholecalciferol 25 MCG (1000 UNIT) tablet Commonly known as: VITAMIN D3 Take 1,000 Units by mouth daily.   epoetin alfa-epbx 10000 UNIT/ML injection Commonly known as: RETACRIT 8,000 Units every 14 (fourteen) days.   FISH OIL PO Take 2,400 mg by mouth 2 (two) times daily.   hydrALAZINE 100 MG tablet Commonly known as: APRESOLINE Take by mouth.   ketotifen 0.025 % ophthalmic solution Commonly known as: Allergy Eye Drops Place 1 drop into both eyes daily as needed (allergies/dry eyes).   Magnesium 100 MG Caps Take 100 mg by mouth daily.   metoprolol succinate 100 MG 24 hr tablet Commonly known as: TOPROL-XL Take 1 tablet (100 mg total) by mouth every evening.   multivitamin with minerals Tabs tablet Take 1 tablet by mouth daily.   NovoLIN 70/30 ReliOn (70-30) 100 UNIT/ML injection Generic drug: insulin NPH-regular Human Inject 20-100 Units into the skin in the morning and at bedtime.   pravastatin 80 MG tablet Commonly known as: PRAVACHOL Take 1 tablet (80 mg total) by mouth every evening.   torsemide 20 MG tablet Commonly known as: DEMADEX Take 2 tablets by mouth daily; okay to use extra 20 mg daily if patient gain more than 3 pounds overnight or more than 5 pounds in a week.   vitamin B-12 1000 MCG tablet Commonly  known as: CYANOCOBALAMIN Take 1,000 mcg by mouth daily.   Vitamin C 500 MG Chew Chew 1,000 mg by mouth daily.   Zinc 30 MG Caps Take by mouth.       Allergies:  Allergies  Allergen Reactions  . Dust Mite Extract Itching and Other (See Comments)    Unknown reaction-potential shortness of breath  . Prednisone Nausea And Vomiting  . Rocephin [Ceftriaxone] Nausea And Vomiting    Family History: Family History  Problem Relation Age of Onset  . Diabetes Mother   . Heart attack Mother   . Heart attack Father   . Diabetes Brother     Social History:  reports that he has never smoked. He has never used smokeless tobacco. He reports that he  does not drink alcohol and does not use drugs.  ROS: All other review of systems were reviewed and are negative except what is noted above in HPI  Physical Exam: BP (!) 168/93   Pulse 81   Temp 97.6 F (36.4 C)   Constitutional:  Alert and oriented, No acute distress. HEENT: [place] AT, moist mucus membranes.  Trachea midline, no masses. Cardiovascular: No clubbing, cyanosis, or edema. Respiratory: Normal respiratory effort, no increased work of breathing. GI: Abdomen is soft, nontender, nondistended, no abdominal masses GU: No CVA tenderness.  Lymph: No cervical or inguinal lymphadenopathy. Skin: No rashes, bruises or suspicious lesions. Neurologic: Grossly intact, no focal deficits, moving all 4 extremities. Psychiatric: Normal mood and affect.  Laboratory Data: Lab Results  Component Value Date   WBC 8.0 08/17/2020   HGB 8.0 (L) 08/31/2020   HCT 25.4 (L) 08/17/2020   MCV 87.9 08/17/2020   PLT 196 08/17/2020    Lab Results  Component Value Date   CREATININE 3.69 (H) 08/17/2020    No results found for: PSA  No results found for: TESTOSTERONE  Lab Results  Component Value Date   HGBA1C 7.1 (H) 03/24/2020    Urinalysis    Component Value Date/Time   COLORURINE YELLOW 03/23/2020 1622   APPEARANCEUR Clear 06/09/2020 1118   LABSPEC 1.010 03/23/2020 1622   PHURINE 5.0 03/23/2020 1622   GLUCOSEU 1+ (A) 06/09/2020 1118   HGBUR LARGE (A) 03/23/2020 1622   BILIRUBINUR Negative 06/09/2020 1118   [place] 03/23/2020 1622   PROTEINUR 3+ (A) 06/09/2020 1118   PROTEINUR >=300 (A) 03/23/2020 1622   NITRITE Negative 06/09/2020 1118   NITRITE NEGATIVE 03/23/2020 1622   LEUKOCYTESUR Trace (A) 06/09/2020 1118   LEUKOCYTESUR TRACE (A) 03/23/2020 1622    Lab Results  Component Value Date   LABMICR See below: 06/09/2020   WBCUA 6-10 (A) 06/09/2020   LABEPIT None seen 06/09/2020   MUCUS Present 06/09/2020   BACTERIA Few 06/09/2020    Pertinent Imaging:  No  results found for this or any previous visit.  No results found for this or any previous visit.  No results found for this or any previous visit.  No results found for this or any previous visit.  Results for orders placed during the hospital encounter of 07/07/19  US RENAL  Narrative CLINICAL DATA:  Acute kidney injury.  EXAM: RENAL / URINARY TRACT ULTRASOUND COMPLETE  COMPARISON:  Renal ultrasound 02/24/2019  FINDINGS: Right Kidney:  Renal measurements: 10.9 x 5.4 x 5.8 cm = volume: 182 mL. No hydronephrosis. Mildly increased renal cortical echogenicity. Redemonstrated exophytic simple appearing interpolar cyst measuring 2.4 x 2.4 x 2.5 cm.  Left Kidney:  Renal measurements: 11.8 x 6.1 x 4.9  cm = volume: 184 mL. No hydronephrosis. Mildly increased renal cortical echogenicity.  Bladder:  The bladder is collapsed around a Foley catheter.  Other:  Small volume pelvic ascites.  IMPRESSION: Increased renal cortical echogenicity bilaterally, suggestive of chronic renal parenchymal disease.  2.5 cm simple appearing right renal cyst.  No hydronephrosis.  Bladder collapsed around Foley catheter.  Small volume pelvic ascites.   Electronically Signed By: Kellie Simmering DO On: 07/08/2019 09:25  No results found for this or any previous visit.  No results found for this or any previous visit.  Results for orders placed during the hospital encounter of 01/22/20  CT Renal Stone Study  Narrative CLINICAL DATA:  Flank pain.  EXAM: CT ABDOMEN AND PELVIS WITHOUT CONTRAST  TECHNIQUE: Multidetector CT imaging of the abdomen and pelvis was performed following the standard protocol without IV contrast.  COMPARISON:  February 28, 2019  FINDINGS: Lower chest: Very mild atelectasis is seen within the bilateral lung bases.  Small bilateral pleural effusions are seen, right greater than left.  Hepatobiliary: No focal liver abnormality is seen. Status  post cholecystectomy. No biliary dilatation.  Pancreas: Unremarkable. No pancreatic ductal dilatation or surrounding inflammatory changes.  Spleen: Normal in size without focal abnormality.  Adrenals/Urinary Tract: The right adrenal gland is normal in appearance. A 3.4 cm x 3.1 cm low-attenuation left adrenal mass is seen. Kidneys are normal in size. A 2.7 cm exophytic cyst is seen along the anterolateral aspect of the mid right kidney. A subcentimeter left renal cyst is noted. A stable, approximately 1.0 cm ill-defined mildly hyperdense focus is seen within the anteromedial aspect of the mid left kidney (axial CT image 40, CT series number 2). 2 mm and 3 mm nonobstructing renal stones are seen within the right kidney. Additional 2 mm nonobstructing renal stones are seen within the lower pole of the left kidney.  A moderate to marked amount of heterogeneous hyperdense material is seen within the dependent portion of a moderately distended urinary bladder. A mild amount of intraluminal air is seen as well as the distal tip and insufflator bulb of a Foley catheter. A mild amount of inflammatory fat stranding is seen anterior to the urinary bladder. No urinary bladder wall thickening is noted.  Stomach/Bowel: Stomach is within normal limits. The appendix is not clearly identified. No evidence of bowel wall thickening, distention, or inflammatory changes.  Vascular/Lymphatic: There is mild to moderate severity calcification of the abdominal aorta and bilateral common iliac arteries. No enlarged abdominal or pelvic lymph nodes.  Reproductive: There is very mild enlargement of the prostate gland.  Other: A 2.7 cm x 1.8 cm fat containing right inguinal hernia is seen. An additional 4.5 cm x 4.1 cm fat containing left inguinal hernia is noted.  No free fluid is seen.  Musculoskeletal: Mild subcutaneous inflammatory fat stranding is seen along the midline of the anterior pelvic  wall.  Moderate severity multilevel degenerative changes are noted throughout the lumbar spine with vacuum disc phenomenon seen at the levels of L2-L3 and L3-L4.  IMPRESSION: 1. Moderate to marked amount of heterogeneous hyperdense material within the dependent portion of a moderately distended urinary bladder. While this is suspicious for hemorrhagic material, the presence of an underlying neoplastic process cannot be excluded. Correlation with cystoscopy is recommended. 2. Bilateral nonobstructing renal stones. 3. Stable low-attenuation left adrenal mass which may represent an adrenal adenoma. 4. Small bilateral pleural effusions, right greater than left. 5. Bilateral fat containing inguinal hernias. 6. Mild to moderate  severity calcification of the abdominal aorta and bilateral common iliac arteries.  Aortic Atherosclerosis (ICD10-I70.0).   Electronically Signed By: Virgina Norfolk M.D. On: 01/22/2020 09:40   Assessment & Plan:    1. Urinary retention -resolved  - Urinalysis, Routine w reflex microscopic  2. Benign prostatic hyperplasia with urinary obstruction -RTC 6 months with PVR   No follow-ups on file.  Nicolette Bang, MD  [facility] [place]

## 2020-09-10 NOTE — Progress Notes (Signed)
Urological Symptom Review  Patient is experiencing the following symptoms: Frequent urination   Review of Systems  Gastrointestinal (upper)  : Negative for upper GI symptoms  Gastrointestinal (lower) : Negative for lower GI symptoms  Constitutional : fatigue  Skin: Negative for skin symptoms  Eyes: Negative for eye symptoms  Ear/Nose/Throat : Negative for Ear/Nose/Throat symptoms  Hematologic/Lymphatic: Negative for Hematologic/Lymphatic symptoms  Cardiovascular : Negative for cardiovascular symptoms  Respiratory : Negative for respiratory symptoms  Endocrine: Negative for endocrine symptoms  Musculoskeletal: Negative for musculoskeletal symptoms  Neurological: Negative for neurological symptoms  Psychologic: Negative for psychiatric symptoms

## 2020-09-13 ENCOUNTER — Encounter (HOSPITAL_COMMUNITY): Payer: Self-pay

## 2020-09-13 ENCOUNTER — Inpatient Hospital Stay (HOSPITAL_COMMUNITY): Payer: Medicare HMO | Attending: Hematology | Admitting: Hematology

## 2020-09-13 ENCOUNTER — Other Ambulatory Visit: Payer: Self-pay

## 2020-09-13 ENCOUNTER — Inpatient Hospital Stay (HOSPITAL_COMMUNITY): Payer: Medicare HMO

## 2020-09-13 ENCOUNTER — Encounter (HOSPITAL_COMMUNITY)
Admission: RE | Admit: 2020-09-13 | Discharge: 2020-09-13 | Disposition: A | Discharge status: Home / Self Care | Payer: Medicare HMO | Source: Ambulatory Visit | Attending: Nephrology | Admitting: Nephrology

## 2020-09-13 VITALS — BP 173/87 | HR 88 | Temp 96.8°F | Resp 20 | Wt 278.8 lb

## 2020-09-13 DIAGNOSIS — D631 Anemia in chronic kidney disease: Secondary | ICD-10-CM | POA: Diagnosis not present

## 2020-09-13 DIAGNOSIS — N184 Chronic kidney disease, stage 4 (severe): Secondary | ICD-10-CM | POA: Diagnosis not present

## 2020-09-13 DIAGNOSIS — E1122 Type 2 diabetes mellitus with diabetic chronic kidney disease: Secondary | ICD-10-CM | POA: Diagnosis not present

## 2020-09-13 DIAGNOSIS — D649 Anemia, unspecified: Secondary | ICD-10-CM | POA: Insufficient documentation

## 2020-09-13 LAB — CBC WITH DIFFERENTIAL/PLATELET
Abs Immature Granulocytes: 0.03 10*3/uL (ref 0.00–0.07)
Basophils Absolute: 0 10*3/uL (ref 0.0–0.1)
Basophils Relative: 1 %
Eosinophils Absolute: 0.3 10*3/uL (ref 0.0–0.5)
Eosinophils Relative: 3 %
HCT: 28.5 % — ABNORMAL LOW (ref 39.0–52.0)
Hemoglobin: 8.9 g/dL — ABNORMAL LOW (ref 13.0–17.0)
Immature Granulocytes: 0 %
Lymphocytes Relative: 8 %
Lymphs Abs: 0.6 10*3/uL — ABNORMAL LOW (ref 0.7–4.0)
MCH: 27.4 pg (ref 26.0–34.0)
MCHC: 31.2 g/dL (ref 30.0–36.0)
MCV: 87.7 fL (ref 80.0–100.0)
Monocytes Absolute: 0.4 10*3/uL (ref 0.1–1.0)
Monocytes Relative: 5 %
Neutro Abs: 6.8 10*3/uL (ref 1.7–7.7)
Neutrophils Relative %: 83 %
Platelets: 222 10*3/uL (ref 150–400)
RBC: 3.25 MIL/uL — ABNORMAL LOW (ref 4.22–5.81)
RDW: 18.8 % — ABNORMAL HIGH (ref 11.5–15.5)
WBC: 8.2 10*3/uL (ref 4.0–10.5)
nRBC: 0 % (ref 0.0–0.2)

## 2020-09-13 LAB — DIRECT ANTIGLOBULIN TEST (NOT AT ARMC)
DAT, IgG: NEGATIVE
DAT, complement: NEGATIVE

## 2020-09-13 LAB — VITAMIN B12: Vitamin B-12: 1695 pg/mL — ABNORMAL HIGH (ref 180–914)

## 2020-09-13 LAB — RETICULOCYTES
Immature Retic Fract: 20.1 % — ABNORMAL HIGH (ref 2.3–15.9)
RBC.: 3.21 MIL/uL — ABNORMAL LOW (ref 4.22–5.81)
Retic Count, Absolute: 54.6 10*3/uL (ref 19.0–186.0)
Retic Ct Pct: 1.7 % (ref 0.4–3.1)

## 2020-09-13 LAB — POCT HEMOGLOBIN-HEMACUE: Hemoglobin: 8.4 g/dL — ABNORMAL LOW (ref 13.0–17.0)

## 2020-09-13 LAB — FOLATE: Folate: 32.8 ng/mL (ref >5.9)

## 2020-09-13 LAB — TSH: TSH: 1.599 u[IU]/mL (ref 0.350–4.500)

## 2020-09-13 LAB — LACTATE DEHYDROGENASE: LDH: 146 U/L (ref 98–192)

## 2020-09-13 MED ORDER — EPOETIN ALFA-EPBX 10000 UNIT/ML IJ SOLN: 8000.0000 [IU] | Freq: Once | INTRAMUSCULAR | Status: AC

## 2020-09-13 MED ORDER — EPOETIN ALFA-EPBX 10000 UNIT/ML IJ SOLN
INTRAMUSCULAR | Status: AC
  Administered 2020-09-13: 8000 [IU] via SUBCUTANEOUS
  Filled 2020-09-13: qty 1

## 2020-09-13 NOTE — Progress Notes (Signed)
[place], [place] [address]   CLINIC:  Medical Oncology/Hematology  Patient Care Team: Celene Squibb, MD as PCP - General (Internal Medicine)  CHIEF COMPLAINTS/PURPOSE OF CONSULTATION:  Evaluation of anemia due to CKD  HISTORY OF PRESENTING ILLNESS:  Danny [name] 65 y.o. male is here because of anemia due to CKD, at the request of Dr. Ulice Bold.  Today he reports feeling fair. He reports having low energy which has been for some time now. He reports that in July 2021 he developed blood clots in his bladder a week after having a prostate surgery and his energy levels have been low since. He has never had blood transfusions previously. He notes having blood due to hemorrhoids when he is constipated and takes a stool softener along with an iron tablet. He denies having any recent infections, F/C, night sweats or weight loss. He denies having history of MI's or CVA's. He has a history of cracked right ankle in his 20's which he did not know was fractured.  He lives at home alone. 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 He is able to do all of his chores and ADL's and is able to drive. His sister has some kind of cancer. He has never had a colonoscopy.   MEDICAL HISTORY:  Past Medical History:  Diagnosis Date  . Anemia   . Arthritis   . CKD (chronic kidney disease), stage IV ([place])   . Diabetes mellitus without complication ([place])   . Foot ulcer ([place])   . Hypertension   . Urinary retention     SURGICAL HISTORY: Past Surgical History:  Procedure Laterality Date  . ANKLE SURGERY Right   . CHOLECYSTECTOMY    . FOOT SURGERY Right   . TRANSURETHRAL RESECTION OF PROSTATE N/A 01/15/2020   Procedure: TRANSURETHRAL RESECTION OF THE PROSTATE (TURP)  with General anesthesia and spinal;  Surgeon: Cleon Gustin, MD;  Location: [facility];  Service: Urology;  Laterality: N/A;    SOCIAL HISTORY: Social History   Socioeconomic History   . Marital status: Divorced    Spouse name: Not on file  . Number of children: Not on file  . Years of education: Not on file  . Highest education level: Not on file  Occupational History  . Not on file  Tobacco Use  . Smoking status: Never Smoker  . Smokeless tobacco: Never Used  Vaping Use  . Vaping Use: Never used  Substance and Sexual Activity  . Alcohol use: No  . Drug use: No  . Sexual activity: Not Currently  Other Topics Concern  . Not on file  Social History Narrative  . Not on file   Social Determinants of Health   Financial Resource Strain: Low Risk   . Difficulty of Paying Living Expenses: Not very hard  Food Insecurity: No Food Insecurity  . Worried About Charity fundraiser in the Last Year: Never true  . Ran Out of Food in the Last Year: Never true  Transportation Needs: No Transportation Needs  . Lack of Transportation (Medical): No  . Lack of Transportation (Non-Medical): No  Physical Activity: Inactive  . Days of Exercise per Week: 0 days  . Minutes of Exercise per Session: 0 min  Stress: No Stress Concern Present  . Feeling of Stress : Only a little  Social Connections: Not on file  Intimate Partner Violence: Not At Risk  . Fear of Current  or Ex-Partner: No  . Emotionally Abused: No  . Physically Abused: No  . Sexually Abused: No    FAMILY HISTORY: Family History  Problem Relation Age of Onset  . Diabetes Mother   . Heart attack Mother   . Heart attack Father   . Diabetes Brother     ALLERGIES:  is allergic to dust mite extract, prednisone, and rocephin [ceftriaxone].  MEDICATIONS:  Current Outpatient Medications  Medication Sig Dispense Refill  . ACCU-CHEK SMARTVIEW test strip 1 each by Other route as needed.     Marland Kitchen acetaminophen (TYLENOL) 500 MG tablet Take 1,000 mg by mouth every 6 (six) hours as needed for moderate pain or headache.    Marland Kitchen amLODipine (NORVASC) 10 MG tablet Take 10 mg by mouth daily.     . Ascorbic Acid (VITAMIN C) 500  MG CHEW Chew 1,000 mg by mouth daily.    Marland Kitchen aspirin 325 MG tablet Take 325 mg by mouth daily.    . cholecalciferol (VITAMIN D3) 25 MCG (1000 UNIT) tablet Take 1,000 Units by mouth daily.    Marland Kitchen epoetin alfa-epbx (RETACRIT) 77824 UNIT/ML injection 8,000 Units every 14 (fourteen) days.    . ferrous sulfate 325 (65 FE) MG tablet Take 325 mg by mouth 2 (two) times daily with a meal.    . hydrALAZINE (APRESOLINE) 100 MG tablet Take by mouth.    Marland Kitchen ketotifen (ALLERGY EYE DROPS) 0.025 % ophthalmic solution Place 1 drop into both eyes daily as needed (allergies/dry eyes).    . Magnesium 100 MG CAPS Take 100 mg by mouth daily.     . metoprolol succinate (TOPROL-XL) 100 MG 24 hr tablet Take 1 tablet (100 mg total) by mouth every evening. 30 tablet 0  . Multiple Vitamin (MULTIVITAMIN WITH MINERALS) TABS tablet Take 1 tablet by mouth daily.    Marland Kitchen NOVOLIN 70/30 RELION (70-30) 100 UNIT/ML injection Inject 20-100 Units into the skin in the morning and at bedtime.     . Omega-3 Fatty Acids (FISH OIL PO) Take 2,400 mg by mouth 2 (two) times daily.    . pravastatin (PRAVACHOL) 80 MG tablet Take 1 tablet (80 mg total) by mouth every evening. 30 tablet 0  . torsemide (DEMADEX) 20 MG tablet Take 2 tablets by mouth daily; okay to use extra 20 mg daily if patient gain more than 3 pounds overnight or more than 5 pounds in a week.    . vitamin B-12 (CYANOCOBALAMIN) 1000 MCG tablet Take 1,000 mcg by mouth daily.    . Zinc 30 MG CAPS Take by mouth.     No current facility-administered medications for this visit.    REVIEW OF SYSTEMS:   Review of Systems  Constitutional: Positive for appetite change (50%) and fatigue (50%). Negative for chills, diaphoresis, fever and unexpected weight change.  Gastrointestinal: Positive for constipation (2-3 BM's per week).  Neurological: Positive for headaches.  All other systems reviewed and are negative.    PHYSICAL EXAMINATION: ECOG PERFORMANCE STATUS: 2 - Symptomatic, <50%  confined to bed  Vitals:   09/13/20 1337  BP: (!) 173/87  Pulse: 88  Resp: 20  Temp: (!) 96.8 F (36 C)  SpO2: 98%   Filed Weights   09/13/20 1337  Weight: 278 lb 12.8 oz (126.5 kg)   Physical Exam Vitals reviewed.  Constitutional:      Appearance: Normal appearance. He is obese.     Comments: In wheelchair  Cardiovascular:     Rate and Rhythm: Normal rate and  regular rhythm.     Pulses: Normal pulses.     Heart sounds: Normal heart sounds.  Pulmonary:     Effort: Pulmonary effort is normal.     Breath sounds: Normal breath sounds.  Chest:  Breasts:     Right: No axillary adenopathy.     Left: No axillary adenopathy.    Abdominal:     Palpations: Abdomen is soft. There is no mass.     Tenderness: There is no abdominal tenderness.     Hernia: No hernia is present.  Musculoskeletal:     Right lower leg: Edema (chronic lymphedema) present.     Left lower leg: Edema (chronic lymphedema) present.  Lymphadenopathy:     Upper Body:     Right upper body: No axillary or pectoral adenopathy.     Left upper body: No axillary or pectoral adenopathy.  Neurological:     General: No focal deficit present.     Mental Status: He is alert and oriented to person, place, and time.  Psychiatric:        Mood and Affect: Mood normal.        Behavior: Behavior normal.      LABORATORY DATA:  I have reviewed the data as listed Recent Results (from the past 2160 hour(s))  Urinalysis, Routine w reflex microscopic     Status: Abnormal   Collection Time: 09/10/20 11:22 AM  Result Value Ref Range   Specific Gravity, UA 1.020 1.005 - 1.030   pH, UA 7.0 5.0 - 7.5   Color, UA Yellow Yellow   Appearance Ur Clear Clear   Leukocytes,UA Negative Negative   Protein,UA 3+ (A) Negative/Trace   Glucose, UA 1+ (A) Negative   Ketones, UA Negative Negative   RBC, UA 1+ (A) Negative   Bilirubin, UA Negative Negative   Urobilinogen, Ur 0.2 0.2 - 1.0 mg/dL   Nitrite, UA Negative Negative    Microscopic Examination See below:   Microscopic Examination     Status: Abnormal   Collection Time: 09/10/20 11:22 AM   Urine  Result Value Ref Range   WBC, UA None seen 0 - 5 /hpf   RBC 3-10 (A) 0 - 2 /hpf   Epithelial Cells (non renal) None seen 0 - 10 /hpf   Renal Epithel, UA None seen None seen /hpf   Mucus, UA Present Not Estab.   Bacteria, UA None seen None seen/Few  BLADDER SCAN AMB NON-IMAGING     Status: None   Collection Time: 09/10/20 11:32 AM  Result Value Ref Range   Scan Result 48   Hemoglobin-hemacue, POC     Status: Abnormal   Collection Time: 09/13/20 12:15 PM  Result Value Ref Range   Hemoglobin 8.4 (L) 13.0 - 17.0 g/dL    RADIOGRAPHIC STUDIES: I have personally reviewed the radiological images as listed and agreed with the findings in the report. No results found.  ASSESSMENT:  1.  Anemia due to chronic kidney disease: -Seen at the request of Dr. Theador Hawthorne for normocytic anemia. -Patient receiving Retacrit 4000 units every 2 weeks started 10/10/2019, dose increased to 8000 units every 2 weeks on 08/17/2020. -Last Venofer, 5 infusions from 08/03/2020 through 08/17/2020. -No prior history of transfusion.  Never had colonoscopy. -He reports occasional rectal bleeding when he is constipated.  He took iron pills in the past which caused constipation.  2.  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  Quit smoking in 1988. -Sister had cancer, patient  does not know the type.  3.  CKD: -Stage IV, since 2013, secondary to diabetes. -Renal ultrasound on 07/08/2019 shows increased renal cortical echogenicity bilaterally, suggestive of chronic renal parenchymal disease.  2.5 cm simple right renal cyst with no hydronephrosis.   PLAN:  1.  Normocytic anemia secondary to CKD: -I have reviewed labs over the past couple of years and records sent from Dr. Toya Smothers office. -Last ferritin was 400, percent saturation 17 on 08/17/2020. -Hemoglobin  between 8 and 9 since Retacrit started. -We will repeat his CBC today and check for other nutritional deficiencies including R10, folic acid, methylmalonic acid, copper levels.  We will check for hemolysis with LDH, haptoglobin, reticulocyte count and direct Coombs test.  We will follow up on PTH levels from Dr. Toya Smothers office. -We will also check for plasma cell disorders with SPEP, immunofixation and free light chains. -We will check stool for occult blood. -If the above tests are nonconclusive, would recommend bone marrow aspiration and biopsy.    All questions were answered. The patient knows to call the clinic with any problems, questions or concerns.  [name], MD 09/13/20 2:21 PM  [place] [phone]   I, Milinda Antis, am acting as a scribe for Dr. Sanda Linger.  I, [name] MD, have reviewed the above documentation for accuracy and completeness, and I agree with the above.

## 2020-09-13 NOTE — Patient Instructions (Addendum)
[place] at [facility] Discharge Instructions  You were seen and examined today by Dr. Delton Coombes. Dr. Delton Coombes is a hematologist, meaning he specializes in blood disorders. Dr. Delton Coombes discussed your past medical history, family history of blood disorders/cancers and the events that led to you being here today.  You were referred to Dr. Delton Coombes by Dr. Theador Hawthorne for anemia that has persisted despite Dr. Toya Smothers treatment. Dr. Delton Coombes has recommended additional lab work to see there is an identifiable cause for the anemia in your blood work. We will start with blood work and see if there is any need for a bone marrow biopsy.  Follow-up as scheduled.   Thank you for choosing [place] at [facility] to provide your oncology and hematology care.  To afford each patient quality time with our provider, please arrive at least 15 minutes before your scheduled appointment time.   If you have a lab appointment with the [facility] please come in thru the Main Entrance and check in at the main information desk.  You need to re-schedule your appointment should you arrive 10 or more minutes late.  We strive to give you quality time with our providers, and arriving late affects you and other patients whose appointments are after yours.  Also, if you no show three or more times for appointments you may be dismissed from the clinic at the providers discretion.     Again, thank you for choosing [facility].  Our hope is that these requests will decrease the amount of time that you wait before being seen by our physicians.       _____________________________________________________________  Should you have questions after your visit to [facility], please contact our office at [phone] and follow the prompts.  Our office hours are 8:00 a.m. and 4:30 p.m. Monday - Friday.  Please note that voicemails left after 4:00 p.m.  may not be returned until the following business day.  We are closed weekends and major holidays.  You do have access to a nurse 24-7, just call the main number to the clinic [phone] and do not press any options, hold on the line and a nurse will answer the phone.    For prescription refill requests, have your pharmacy contact our office and allow 72 hours.    Due to Covid, you will need to wear a mask upon entering the hospital. If you do not have a mask, a mask will be given to you at the Main Entrance upon arrival. For doctor visits, patients may have 1 support person age 54 or older with them. For treatment visits, patients can not have anyone with them due to social distancing guidelines and our immunocompromised population.

## 2020-09-14 ENCOUNTER — Ambulatory Visit (HOSPITAL_COMMUNITY): Payer: Medicare HMO

## 2020-09-14 LAB — KAPPA/LAMBDA LIGHT CHAINS
Kappa free light chain: 186.6 mg/L — ABNORMAL HIGH (ref 3.3–19.4)
Kappa, lambda light chain ratio: 1.14 (ref 0.26–1.65)
Lambda free light chains: 163.5 mg/L — ABNORMAL HIGH (ref 5.7–26.3)

## 2020-09-14 LAB — HAPTOGLOBIN: Haptoglobin: 211 mg/dL (ref 32–363)

## 2020-09-15 LAB — PROTEIN ELECTROPHORESIS, SERUM
A/G Ratio: 0.7 (ref 0.7–1.7)
Albumin ELP: 3 g/dL (ref 2.9–4.4)
Alpha-1-Globulin: 0.2 g/dL (ref 0.0–0.4)
Alpha-2-Globulin: 1 g/dL (ref 0.4–1.0)
Beta Globulin: 1.1 g/dL (ref 0.7–1.3)
Gamma Globulin: 2.1 g/dL — ABNORMAL HIGH (ref 0.4–1.8)
Globulin, Total: 4.3 g/dL — ABNORMAL HIGH (ref 2.2–3.9)
Total Protein ELP: 7.3 g/dL (ref 6.0–8.5)

## 2020-09-15 LAB — IMMUNOFIXATION ELECTROPHORESIS
IgA: 372 mg/dL (ref 61–437)
IgG (Immunoglobin G), Serum: 1931 mg/dL — ABNORMAL HIGH (ref 603–1613)
IgM (Immunoglobulin M), Srm: 474 mg/dL — ABNORMAL HIGH (ref 20–172)
Total Protein ELP: 7.4 g/dL (ref 6.0–8.5)

## 2020-09-15 LAB — METHYLMALONIC ACID, SERUM: Methylmalonic Acid, Quantitative: 721 nmol/L — ABNORMAL HIGH (ref 0–378)

## 2020-09-16 LAB — COPPER, SERUM: Copper: 126 ug/dL (ref 69–132)

## 2020-09-26 ENCOUNTER — Emergency Department (HOSPITAL_COMMUNITY): Payer: Medicare HMO

## 2020-09-26 ENCOUNTER — Other Ambulatory Visit: Payer: Self-pay

## 2020-09-26 ENCOUNTER — Encounter (HOSPITAL_COMMUNITY): Payer: Self-pay

## 2020-09-26 ENCOUNTER — Emergency Department (HOSPITAL_COMMUNITY)
Admission: EM | Admit: 2020-09-26 | Discharge: 2020-09-26 | Disposition: A | Discharge status: Home / Self Care | Payer: Medicare HMO | Attending: Emergency Medicine | Admitting: Emergency Medicine

## 2020-09-26 DIAGNOSIS — R531 Weakness: Secondary | ICD-10-CM

## 2020-09-26 DIAGNOSIS — I5033 Acute on chronic diastolic (congestive) heart failure: Secondary | ICD-10-CM | POA: Insufficient documentation

## 2020-09-26 DIAGNOSIS — I10 Essential (primary) hypertension: Secondary | ICD-10-CM | POA: Diagnosis not present

## 2020-09-26 DIAGNOSIS — N184 Chronic kidney disease, stage 4 (severe): Secondary | ICD-10-CM | POA: Diagnosis not present

## 2020-09-26 DIAGNOSIS — J90 Pleural effusion, not elsewhere classified: Secondary | ICD-10-CM | POA: Diagnosis not present

## 2020-09-26 DIAGNOSIS — D649 Anemia, unspecified: Secondary | ICD-10-CM | POA: Insufficient documentation

## 2020-09-26 DIAGNOSIS — E08621 Diabetes mellitus due to underlying condition with foot ulcer: Secondary | ICD-10-CM | POA: Diagnosis not present

## 2020-09-26 DIAGNOSIS — Z20822 Contact with and (suspected) exposure to covid-19: Secondary | ICD-10-CM | POA: Insufficient documentation

## 2020-09-26 DIAGNOSIS — R5381 Other malaise: Secondary | ICD-10-CM | POA: Diagnosis not present

## 2020-09-26 DIAGNOSIS — D638 Anemia in other chronic diseases classified elsewhere: Secondary | ICD-10-CM | POA: Diagnosis not present

## 2020-09-26 DIAGNOSIS — Z7982 Long term (current) use of aspirin: Secondary | ICD-10-CM | POA: Insufficient documentation

## 2020-09-26 DIAGNOSIS — L97519 Non-pressure chronic ulcer of other part of right foot with unspecified severity: Secondary | ICD-10-CM | POA: Diagnosis not present

## 2020-09-26 DIAGNOSIS — M7989 Other specified soft tissue disorders: Secondary | ICD-10-CM | POA: Diagnosis not present

## 2020-09-26 DIAGNOSIS — I517 Cardiomegaly: Secondary | ICD-10-CM | POA: Diagnosis not present

## 2020-09-26 DIAGNOSIS — E1122 Type 2 diabetes mellitus with diabetic chronic kidney disease: Secondary | ICD-10-CM | POA: Insufficient documentation

## 2020-09-26 DIAGNOSIS — Z79899 Other long term (current) drug therapy: Secondary | ICD-10-CM | POA: Diagnosis not present

## 2020-09-26 DIAGNOSIS — I509 Heart failure, unspecified: Secondary | ICD-10-CM | POA: Diagnosis not present

## 2020-09-26 DIAGNOSIS — I130 Hypertensive heart and chronic kidney disease with heart failure and stage 1 through stage 4 chronic kidney disease, or unspecified chronic kidney disease: Secondary | ICD-10-CM | POA: Insufficient documentation

## 2020-09-26 DIAGNOSIS — Z743 Need for continuous supervision: Secondary | ICD-10-CM | POA: Diagnosis not present

## 2020-09-26 DIAGNOSIS — J811 Chronic pulmonary edema: Secondary | ICD-10-CM | POA: Diagnosis not present

## 2020-09-26 LAB — BLOOD GAS, ARTERIAL
Acid-base deficit: 4.5 mmol/L — ABNORMAL HIGH (ref 0.0–2.0)
Bicarbonate: 20.4 mmol/L (ref 20.0–28.0)
FIO2: 21
O2 Saturation: 80.8 %
Patient temperature: 36.1
pCO2 arterial: 36.7 mmHg (ref 32.0–48.0)
pH, Arterial: 7.354 (ref 7.350–7.450)
pO2, Arterial: 46.3 mmHg — ABNORMAL LOW (ref 83.0–108.0)

## 2020-09-26 LAB — CBC WITH DIFFERENTIAL/PLATELET
Abs Immature Granulocytes: 0.02 10*3/uL (ref 0.00–0.07)
Basophils Absolute: 0.1 10*3/uL (ref 0.0–0.1)
Basophils Relative: 1 %
Eosinophils Absolute: 0.4 10*3/uL (ref 0.0–0.5)
Eosinophils Relative: 5 %
HCT: 27.3 % — ABNORMAL LOW (ref 39.0–52.0)
Hemoglobin: 8.7 g/dL — ABNORMAL LOW (ref 13.0–17.0)
Immature Granulocytes: 0 %
Lymphocytes Relative: 9 %
Lymphs Abs: 0.7 10*3/uL (ref 0.7–4.0)
MCH: 27.7 pg (ref 26.0–34.0)
MCHC: 31.9 g/dL (ref 30.0–36.0)
MCV: 86.9 fL (ref 80.0–100.0)
Monocytes Absolute: 0.6 10*3/uL (ref 0.1–1.0)
Monocytes Relative: 7 %
Neutro Abs: 6.1 10*3/uL (ref 1.7–7.7)
Neutrophils Relative %: 78 %
Platelets: 190 10*3/uL (ref 150–400)
RBC: 3.14 MIL/uL — ABNORMAL LOW (ref 4.22–5.81)
RDW: 18.1 % — ABNORMAL HIGH (ref 11.5–15.5)
WBC: 7.8 10*3/uL (ref 4.0–10.5)
nRBC: 0 % (ref 0.0–0.2)

## 2020-09-26 LAB — COMPREHENSIVE METABOLIC PANEL
ALT: 39 U/L (ref 0–44)
AST: 26 U/L (ref 15–41)
Albumin: 2.7 g/dL — ABNORMAL LOW (ref 3.5–5.0)
Alkaline Phosphatase: 109 U/L (ref 38–126)
Anion gap: 9 (ref 5–15)
BUN: 46 mg/dL — ABNORMAL HIGH (ref 8–23)
CO2: 19 mmol/L — ABNORMAL LOW (ref 22–32)
Calcium: 8.6 mg/dL — ABNORMAL LOW (ref 8.9–10.3)
Chloride: 110 mmol/L (ref 98–111)
Creatinine, Ser: 3.96 mg/dL — ABNORMAL HIGH (ref 0.61–1.24)
GFR, Estimated: 16 mL/min — ABNORMAL LOW (ref >60)
Glucose, Bld: 181 mg/dL — ABNORMAL HIGH (ref 70–99)
Potassium: 4.5 mmol/L (ref 3.5–5.1)
Sodium: 138 mmol/L (ref 135–145)
Total Bilirubin: 0.7 mg/dL (ref 0.3–1.2)
Total Protein: 7.3 g/dL (ref 6.5–8.1)

## 2020-09-26 LAB — URINALYSIS, ROUTINE W REFLEX MICROSCOPIC
Bacteria, UA: NONE SEEN
Bilirubin Urine: NEGATIVE
Glucose, UA: 150 mg/dL — AB
Ketones, ur: NEGATIVE mg/dL
Leukocytes,Ua: NEGATIVE
Nitrite: NEGATIVE
Protein, ur: >=300 mg/dL — AB
Specific Gravity, Urine: 1.01 (ref 1.005–1.030)
pH: 6 (ref 5.0–8.0)

## 2020-09-26 LAB — PHOSPHORUS: Phosphorus: 4.3 mg/dL (ref 2.5–4.6)

## 2020-09-26 LAB — RESP PANEL BY RT-PCR (FLU A&B, COVID) ARPGX2
Influenza A by PCR: NEGATIVE
Influenza B by PCR: NEGATIVE
SARS Coronavirus 2 by RT PCR: NEGATIVE

## 2020-09-26 LAB — MAGNESIUM: Magnesium: 1.8 mg/dL (ref 1.7–2.4)

## 2020-09-26 MED ORDER — DOXYCYCLINE HYCLATE 100 MG PO CAPS: 100.0000 mg | ORAL_CAPSULE | Freq: Two times a day (BID) | ORAL | 0 refills | Status: DC

## 2020-09-26 MED ORDER — DOXYCYCLINE HYCLATE 100 MG PO TABS
100.0000 mg | ORAL_TABLET | Freq: Once | ORAL | Status: AC
  Administered 2020-09-26: 100 mg via ORAL
  Filled 2020-09-26: qty 1

## 2020-09-26 MED ORDER — FUROSEMIDE 10 MG/ML IJ SOLN
40.0000 mg | INTRAMUSCULAR | Status: AC
  Administered 2020-09-26: 40 mg via INTRAVENOUS
  Filled 2020-09-26: qty 4

## 2020-09-26 NOTE — Discharge Instructions (Addendum)
Your testing today has been reassuring, thankfully you do not have pneumonia or urinary infection  Your kidneys are at their baseline function, they are not great but they are working.  You do not have a urinary infection or pneumonia, I want you to start taking doxycycline twice a day to treat for a possible skin infection in your legs and I would also like for you to see a podiatrist in the office.  I will give you their phone number above.  Please call in the morning for an appointment

## 2020-09-26 NOTE — ED Provider Notes (Signed)
[facility] Provider Note   CSN: [id number] Arrival date & time: 09/26/20  0930     History Chief Complaint  Patient presents with  . Weakness    Danny [name] is a 65 y.o. male.  HPI   This patient is a 65 year old male, he has known chronic kidney disease stage IV, he is a diabetic, he has hypertension and unfortunately the patient does have some lower extremity ulcers especially on the right foot.  He was recently seen in the office on March 21, he has anemia that is related to the chronic kidney disease and is treated by Dr. Delton Coombes for that.  The patient presents to the hospital with a complaint of generalized weakness.  He reports that ever since he had hemorrhagic cystitis a year ago and had a irrigation of his bladder done he has been more anemic and more weak but over the last several days it seems to have worsened.  The patient reports that his general state of health is that he is able to get out of bed by himself, he uses a walker or a wheelchair to get around, he still drives a car.  He reports increasing weakness that left him in a wheelchair all night long, he could not get out of the wheelchair because of weakness but finally this morning was able to transition to a recliner.  Further history obtained from paramedics report that the patient was generally weak but had no significant abnormal vital signs and had a normal level of alertness.  Per the office notes from March 21 the patient was referred to the oncology clinic from his nephrologist Dr. Theador Hawthorne, the anemia is considered normocytic, he is receiving Retacrit 4000 units every 2 weeks, this was increased to 8000 units every 2 weeks on February 22.  He is also getting iron infusions recently on February 22.  Has never had to have a blood transfusion  Past Medical History:  Diagnosis Date  . Anemia   . Arthritis   . CKD (chronic kidney disease), stage IV ([place])   . Diabetes mellitus without  complication ([place])   . Foot ulcer ([place])   . Hypertension   . Urinary retention     Patient Active Problem List   Diagnosis Date Noted  . Normocytic anemia 09/13/2020  . Class 2 obesity   . Respiratory failure ([place]) 03/23/2020  . UTI (urinary tract infection), bacterial 01/22/2020  . Acute on chronic anemia 01/16/2020  . Benign prostatic hyperplasia with urinary obstruction 01/15/2020  . AKI (acute kidney injury) ([place]) 07/08/2019  . Hypoxia 07/08/2019  . Pressure injury of skin 07/08/2019  . Edema of both lower extremities   . Anasarca 03/23/2019  . Bladder outlet obstruction 03/23/2019  . Yeast infection of the skin 03/16/2019  . Diabetic ulcer of left foot ([place]) 03/15/2019  . Diabetic ulcer of right ankle ([place]) 03/15/2019  . Hypertension associated with stage 3 chronic kidney disease due to type 2 diabetes mellitus ([place]) 03/09/2019  . Controlled type 2 diabetes mellitus with stage 3 chronic kidney disease, with long-term current use of insulin ([place]) 03/09/2019  . Dyslipidemia associated with type 2 diabetes mellitus ([place]) 03/09/2019  . Chronic gout due to renal impairment without tophus 03/09/2019  . Emphysematous cystitis 03/05/2019  . Bilateral hydronephrosis   . Bilateral cellulitis of lower leg 03/03/2019  . Urinary retention 03/03/2019  . Klebsiella Cystitis 03/03/2019  . UTI due to Klebsiella species 03/03/2019  . Plantar ulcer of left foot (  [place]) 03/03/2019  . Acute on chronic diastolic (congestive) heart failure ([place]) 02/25/2019  . Acute respiratory failure with hypoxia ([place]) 02/25/2019  . Acute renal failure superimposed on stage 3 chronic kidney disease ([place]) 02/24/2019  . Congestive heart failure ([place]) 02/24/2019  . Dyspnea 02/23/2019  . Essential hypertension 02/23/2019  . Diabetes mellitus ([place]) 02/23/2019  . CKD (chronic kidney disease) 02/23/2019  . Psoriasis 02/23/2019    Past Surgical History:  Procedure Laterality Date  . ANKLE SURGERY Right   .  CHOLECYSTECTOMY    . FOOT SURGERY Right   . TRANSURETHRAL RESECTION OF PROSTATE N/A 01/15/2020   Procedure: TRANSURETHRAL RESECTION OF THE PROSTATE (TURP)  with General anesthesia and spinal;  Surgeon: Cleon Gustin, MD;  Location: [facility];  Service: Urology;  Laterality: N/A;       Family History  Problem Relation Age of Onset  . Diabetes Mother   . Heart attack Mother   . Heart attack Father   . Diabetes Brother     Social History   Tobacco Use  . Smoking status: Never Smoker  . Smokeless tobacco: Never Used  Vaping Use  . Vaping Use: Never used  Substance Use Topics  . Alcohol use: No  . Drug use: No    Home Medications Prior to Admission medications   Medication Sig Start Date End Date Taking? Authorizing Provider  doxycycline (VIBRAMYCIN) 100 MG capsule Take 1 capsule (100 mg total) by mouth 2 (two) times daily. 09/26/20  Yes Noemi Chapel, MD  ACCU-CHEK SMARTVIEW test strip 1 each by Other route as needed.  05/15/19   [provider]  acetaminophen (TYLENOL) 500 MG tablet Take 1,000 mg by mouth every 6 (six) hours as needed for moderate pain or headache.    [provider]  amLODipine (NORVASC) 10 MG tablet Take 10 mg by mouth daily.  10/14/19   [provider]  Ascorbic Acid (VITAMIN C) 500 MG CHEW Chew 1,000 mg by mouth daily.    [provider]  aspirin 325 MG tablet Take 325 mg by mouth daily.    [provider]  cholecalciferol (VITAMIN D3) 25 MCG (1000 UNIT) tablet Take 1,000 Units by mouth daily.    [provider]  epoetin alfa-epbx (RETACRIT) 54650 UNIT/ML injection 8,000 Units every 14 (fourteen) days. 08/17/20   Liana Gerold, MD  ferrous sulfate 325 (65 FE) MG tablet Take 325 mg by mouth 2 (two) times daily with a meal.    [provider]  hydrALAZINE (APRESOLINE) 100 MG tablet Take by mouth. 09/03/20 09/03/21  [provider]  ketotifen (ALLERGY EYE DROPS) 0.025 % ophthalmic  solution Place 1 drop into both eyes daily as needed (allergies/dry eyes). 03/30/20   Barton Dubois, MD  Magnesium 100 MG CAPS Take 100 mg by mouth daily.     [provider]  metoprolol succinate (TOPROL-XL) 100 MG 24 hr tablet Take 1 tablet (100 mg total) by mouth every evening. 03/20/19   Gerlene Fee, NP  Multiple Vitamin (MULTIVITAMIN WITH MINERALS) TABS tablet Take 1 tablet by mouth daily.    [provider]  NOVOLIN 70/30 RELION (70-30) 100 UNIT/ML injection Inject 20-100 Units into the skin in the morning and at bedtime.  08/13/19   [provider]  Omega-3 Fatty Acids (FISH OIL PO) Take 2,400 mg by mouth 2 (two) times daily.    [provider]  pravastatin (PRAVACHOL) 80 MG tablet Take 1 tablet (80 mg total) by mouth every  evening. 03/20/19   Gerlene Fee, NP  torsemide (DEMADEX) 20 MG tablet Take 2 tablets by mouth daily; okay to use extra 20 mg daily if patient gain more than 3 pounds overnight or more than 5 pounds in a week. 03/30/20   Barton Dubois, MD  vitamin B-12 (CYANOCOBALAMIN) 1000 MCG tablet Take 1,000 mcg by mouth daily.    [provider]  Zinc 30 MG CAPS Take by mouth.    [provider]    Allergies    Dust mite extract, Prednisone, and Rocephin [ceftriaxone]  Review of Systems   Review of Systems  All other systems reviewed and are negative.   Physical Exam Updated Vital Signs BP (!) 149/86 (BP Location: Left Arm)   Pulse (!) 104   Resp (!) 22   Ht 1.854 m (6\' 1" )   Wt 126.5 kg   SpO2 95%   BMI 36.79 kg/m   Physical Exam Vitals and nursing note reviewed.  Constitutional:      General: He is not in acute distress.    Appearance: He is well-developed.     Comments: Generally edematous and chronically ill-appearing mildly weak  HENT:     Head: Normocephalic and atraumatic.     Nose: Nose normal.     Mouth/Throat:     Mouth: Mucous membranes are moist.     Pharynx: No oropharyngeal exudate.   Eyes:     General: No scleral icterus.       Right eye: No discharge.        Left eye: No discharge.     Conjunctiva/sclera: Conjunctivae normal.     Pupils: Pupils are equal, round, and reactive to light.  Neck:     Thyroid: No thyromegaly.     Vascular: No JVD.  Cardiovascular:     Rate and Rhythm: Normal rate and regular rhythm.     Heart sounds: Normal heart sounds. No murmur heard. No friction rub. No gallop.   Pulmonary:     Effort: Pulmonary effort is normal. No respiratory distress.     Breath sounds: Normal breath sounds. No wheezing or rales.  Abdominal:     General: Bowel sounds are normal. There is no distension.     Palpations: Abdomen is soft. There is no mass.     Tenderness: There is no abdominal tenderness.     Comments: Very high body mass index, rotund abdomen, nontender and very soft, no edema on the abdominal wall  Genitourinary:    Comments: Genitals examined, penis scrotum and testicles appear normal, there is some yeast infection in the bilateral inguinal creases Musculoskeletal:        General: Normal range of motion.     Cervical back: Normal range of motion and neck supple.     Comments: The patient has significant edema of his bilateral lower extremities with what appears to be stasis dermatitis, the skin is red and warm to the touch from the skin just below the knee through the feet.  There is significant ulcerations to the right foot, there is an open wound on the top of the foot at the crease at the dorsal crease.  He also has a wound over the lateral malleolus of the right foot.  Lymphadenopathy:     Cervical: No cervical adenopathy.  Skin:    General: Skin is warm and dry.     Findings: Erythema and rash present.  Neurological:     Mental Status: He is alert.  Coordination: Coordination normal.     Comments: The patient is generally weak but able to perform commands.  He is able to lift both legs off the bed the left side seems stronger than  the right side of the legs, the bilateral arms have normal strength, cranial nerves are normal  Psychiatric:        Behavior: Behavior normal.     ED Results / Procedures / Treatments   Labs (all labs ordered are listed, but only abnormal results are displayed) Labs Reviewed  CBC WITH DIFFERENTIAL/PLATELET - Abnormal; Notable for the following components:      Result Value   RBC 3.14 (*)    Hemoglobin 8.7 (*)    HCT 27.3 (*)    RDW 18.1 (*)    All other components within normal limits  COMPREHENSIVE METABOLIC PANEL - Abnormal; Notable for the following components:   CO2 19 (*)    Glucose, Bld 181 (*)    BUN 46 (*)    Creatinine, Ser 3.96 (*)    Calcium 8.6 (*)    Albumin 2.7 (*)    GFR, Estimated 16 (*)    All other components within normal limits  URINALYSIS, ROUTINE W REFLEX MICROSCOPIC - Abnormal; Notable for the following components:   Glucose, UA 150 (*)    Hgb urine dipstick SMALL (*)    Protein, ur >=300 (*)    All other components within normal limits  BLOOD GAS, ARTERIAL - Abnormal; Notable for the following components:   pO2, Arterial 46.3 (*)    Acid-base deficit 4.5 (*)    All other components within normal limits  RESP PANEL BY RT-PCR (FLU A&B, COVID) ARPGX2  URINE CULTURE  MAGNESIUM  PHOSPHORUS    EKG EKG Interpretation  Date/Time:  Sunday September 26 2020 10:35:56 EDT Ventricular Rate:  85 PR Interval:  109 QRS Duration: 95 QT Interval:  363 QTC Calculation: 432 R Axis:   69 Text Interpretation: Age not entered, assumed to be  65 years old for purpose of ECG interpretation Sinus rhythm Ventricular premature complex Short PR interval Confirmed by Noemi Chapel [phone]) on 09/26/2020 11:01:59 AM   Radiology DG Ankle Complete Right  Result Date: 09/26/2020 CLINICAL DATA:  Weakness and bilateral lower extremity swelling since last night. No reported injury. EXAM: RIGHT ANKLE - COMPLETE 3+ VIEW COMPARISON:  06/27/2018 right ankle radiographs FINDINGS:  Intact appearing screw in the proximal right fifth metatarsal. Intact staples in lateral right calcaneus. No acute osseous fracture. Advanced productive right ankle osteoarthritis, similar. No suspicious focal osseous lesions. No acute osseous erosions. IMPRESSION: Advanced productive right ankle osteoarthritis, similar. No evidence of hardware complication. No acute osseous abnormality in the right ankle. Electronically Signed   By: Ilona Sorrel M.D.   On: 09/26/2020 10:45   DG Chest Port 1 View  Result Date: 09/26/2020 CLINICAL DATA:  Weakness, bilateral lower extremity swelling since last night EXAM: PORTABLE CHEST 1 VIEW COMPARISON:  03/23/2020 chest radiograph. FINDINGS: Stable cardiomediastinal silhouette with mild to moderate cardiomegaly. No pneumothorax. Probable trace bilateral pleural effusions. Borderline mild pulmonary edema. No acute consolidative airspace disease. IMPRESSION: 1. Borderline mild congestive heart failure. 2. Probable trace bilateral pleural effusions. Electronically Signed   By: Ilona Sorrel M.D.   On: 09/26/2020 10:40    Procedures Procedures   Medications Ordered in ED Medications  doxycycline (VIBRA-TABS) tablet 100 mg (has no administration in time range)  furosemide (LASIX) injection 40 mg (40 mg Intravenous Given 09/26/20 1140)  ED Course  I have reviewed the triage vital signs and the nursing notes.  Pertinent labs & imaging results that were available during my care of the patient were reviewed by me and considered in my medical decision making (see chart for details).    MDM Rules/Calculators/A&P                          The patient is generally weak, he does not appear to be toxic by any means however he does have rash on his bilateral lower extremities, this could be just a cyst dermatitis, he will need labs to see if he has a leukocytosis or worsening anemia or worsening renal function.  He states he is making plenty of urine without dysuria and he is  not coughing or feeling short of breath.  Certainly the increasing weakness does suggest some type of decompensation.  Labs pending, I will also x-ray his chest and his right ankle.  He is supposed to be taking insulin but did not take it this morning, he takes hydralazine, metoprolol, pravastatin and torsemide.  Thankfully the patient's labs are reassuring, he has chronic renal dysfunction and seems to be at his baseline.  He also has what appears to be clean urine, clean chest x-ray, the ankle x-ray does not show any infiltrative bony abnormalities but I do suspect there is some level of skin infection thus doxycycline has been added.  He is not febrile, he is not tachycardic, he is well-appearing and was able to ambulate to the bathroom, he feels better with more energy than he did on arrival.  He is stable for discharge, podiatry follow-up given.    Final Clinical Impression(s) / ED Diagnoses Final diagnoses:  Generalized weakness  Chronic anemia    Rx / DC Orders ED Discharge Orders         Ordered    doxycycline (VIBRAMYCIN) 100 MG capsule  2 times daily        09/26/20 1356           Noemi Chapel, MD 09/26/20 1400

## 2020-09-26 NOTE — ED Notes (Signed)
Pt assisted to sitting position, able to provide urine specimen. Pt repositioned back in bed. Provided warm blanket. Updated him on POC. Bed low and locked, call bell in reach. No additional questions at this time.

## 2020-09-26 NOTE — ED Triage Notes (Signed)
Patient states that he felt weak last night and regained some of his strength this am around 0600 but is concerned that it's related to low hgb. States that he is due his injection for anemia tomorrow. Denies pain, SOB.

## 2020-09-27 ENCOUNTER — Ambulatory Visit (HOSPITAL_COMMUNITY): Payer: Medicare HMO | Admitting: Hematology

## 2020-09-27 ENCOUNTER — Encounter (HOSPITAL_COMMUNITY): Admission: RE | Admit: 2020-09-27 | Payer: Medicare HMO | Source: Ambulatory Visit

## 2020-09-28 LAB — URINE CULTURE

## 2020-10-05 ENCOUNTER — Encounter (HOSPITAL_COMMUNITY)
Admission: RE | Admit: 2020-10-05 | Discharge: 2020-10-05 | Disposition: A | Discharge status: Home / Self Care | Payer: Medicare HMO | Source: Ambulatory Visit | Attending: Nephrology | Admitting: Nephrology

## 2020-10-13 ENCOUNTER — Inpatient Hospital Stay (HOSPITAL_COMMUNITY): Payer: Medicare HMO | Admitting: Hematology

## 2020-10-25 ENCOUNTER — Emergency Department (HOSPITAL_COMMUNITY): Payer: Medicare HMO

## 2020-10-25 ENCOUNTER — Encounter (HOSPITAL_COMMUNITY): Payer: Self-pay

## 2020-10-25 ENCOUNTER — Other Ambulatory Visit: Payer: Self-pay

## 2020-10-25 ENCOUNTER — Inpatient Hospital Stay (HOSPITAL_COMMUNITY)
Admission: EM | Admit: 2020-10-25 | Discharge: 2020-11-05 | DRG: 603 | Disposition: A | Discharge status: Skilled Nursing Facility | Payer: Medicare HMO | Attending: Internal Medicine | Admitting: Internal Medicine

## 2020-10-25 DIAGNOSIS — Z7982 Long term (current) use of aspirin: Secondary | ICD-10-CM

## 2020-10-25 DIAGNOSIS — Z9119 Patient's noncompliance with other medical treatment and regimen: Secondary | ICD-10-CM

## 2020-10-25 DIAGNOSIS — N179 Acute kidney failure, unspecified: Secondary | ICD-10-CM

## 2020-10-25 DIAGNOSIS — I130 Hypertensive heart and chronic kidney disease with heart failure and stage 1 through stage 4 chronic kidney disease, or unspecified chronic kidney disease: Secondary | ICD-10-CM | POA: Diagnosis present

## 2020-10-25 DIAGNOSIS — E785 Hyperlipidemia, unspecified: Secondary | ICD-10-CM | POA: Diagnosis present

## 2020-10-25 DIAGNOSIS — E8770 Fluid overload, unspecified: Secondary | ICD-10-CM | POA: Diagnosis not present

## 2020-10-25 DIAGNOSIS — Z743 Need for continuous supervision: Secondary | ICD-10-CM | POA: Diagnosis not present

## 2020-10-25 DIAGNOSIS — L97319 Non-pressure chronic ulcer of right ankle with unspecified severity: Secondary | ICD-10-CM | POA: Diagnosis not present

## 2020-10-25 DIAGNOSIS — K5900 Constipation, unspecified: Secondary | ICD-10-CM | POA: Diagnosis present

## 2020-10-25 DIAGNOSIS — Z91199 Patient's noncompliance with other medical treatment and regimen due to unspecified reason: Secondary | ICD-10-CM

## 2020-10-25 DIAGNOSIS — L03119 Cellulitis of unspecified part of limb: Secondary | ICD-10-CM | POA: Diagnosis present

## 2020-10-25 DIAGNOSIS — L89510 Pressure ulcer of right ankle, unstageable: Secondary | ICD-10-CM | POA: Diagnosis present

## 2020-10-25 DIAGNOSIS — Z8249 Family history of ischemic heart disease and other diseases of the circulatory system: Secondary | ICD-10-CM

## 2020-10-25 DIAGNOSIS — R609 Edema, unspecified: Secondary | ICD-10-CM | POA: Diagnosis not present

## 2020-10-25 DIAGNOSIS — I878 Other specified disorders of veins: Secondary | ICD-10-CM

## 2020-10-25 DIAGNOSIS — E871 Hypo-osmolality and hyponatremia: Secondary | ICD-10-CM | POA: Diagnosis not present

## 2020-10-25 DIAGNOSIS — D631 Anemia in chronic kidney disease: Secondary | ICD-10-CM | POA: Diagnosis present

## 2020-10-25 DIAGNOSIS — I872 Venous insufficiency (chronic) (peripheral): Secondary | ICD-10-CM | POA: Diagnosis present

## 2020-10-25 DIAGNOSIS — L97309 Non-pressure chronic ulcer of unspecified ankle with unspecified severity: Secondary | ICD-10-CM

## 2020-10-25 DIAGNOSIS — Z20822 Contact with and (suspected) exposure to covid-19: Secondary | ICD-10-CM | POA: Diagnosis present

## 2020-10-25 DIAGNOSIS — Z993 Dependence on wheelchair: Secondary | ICD-10-CM

## 2020-10-25 DIAGNOSIS — L97318 Non-pressure chronic ulcer of right ankle with other specified severity: Secondary | ICD-10-CM | POA: Diagnosis present

## 2020-10-25 DIAGNOSIS — L03116 Cellulitis of left lower limb: Secondary | ICD-10-CM | POA: Diagnosis present

## 2020-10-25 DIAGNOSIS — N2581 Secondary hyperparathyroidism of renal origin: Secondary | ICD-10-CM | POA: Diagnosis present

## 2020-10-25 DIAGNOSIS — Z881 Allergy status to other antibiotic agents status: Secondary | ICD-10-CM

## 2020-10-25 DIAGNOSIS — E1169 Type 2 diabetes mellitus with other specified complication: Secondary | ICD-10-CM | POA: Diagnosis present

## 2020-10-25 DIAGNOSIS — Z794 Long term (current) use of insulin: Secondary | ICD-10-CM

## 2020-10-25 DIAGNOSIS — E1165 Type 2 diabetes mellitus with hyperglycemia: Secondary | ICD-10-CM | POA: Diagnosis not present

## 2020-10-25 DIAGNOSIS — Z6839 Body mass index (BMI) 39.0-39.9, adult: Secondary | ICD-10-CM

## 2020-10-25 DIAGNOSIS — E11622 Type 2 diabetes mellitus with other skin ulcer: Secondary | ICD-10-CM | POA: Diagnosis present

## 2020-10-25 DIAGNOSIS — I509 Heart failure, unspecified: Secondary | ICD-10-CM | POA: Diagnosis not present

## 2020-10-25 DIAGNOSIS — M1990 Unspecified osteoarthritis, unspecified site: Secondary | ICD-10-CM | POA: Diagnosis present

## 2020-10-25 DIAGNOSIS — M1A30X0 Chronic gout due to renal impairment, unspecified site, without tophus (tophi): Secondary | ICD-10-CM | POA: Diagnosis present

## 2020-10-25 DIAGNOSIS — Z833 Family history of diabetes mellitus: Secondary | ICD-10-CM

## 2020-10-25 DIAGNOSIS — N138 Other obstructive and reflux uropathy: Secondary | ICD-10-CM

## 2020-10-25 DIAGNOSIS — M19071 Primary osteoarthritis, right ankle and foot: Secondary | ICD-10-CM | POA: Diagnosis present

## 2020-10-25 DIAGNOSIS — Z79899 Other long term (current) drug therapy: Secondary | ICD-10-CM

## 2020-10-25 DIAGNOSIS — L03115 Cellulitis of right lower limb: Principal | ICD-10-CM | POA: Diagnosis present

## 2020-10-25 DIAGNOSIS — E1122 Type 2 diabetes mellitus with diabetic chronic kidney disease: Secondary | ICD-10-CM | POA: Diagnosis present

## 2020-10-25 DIAGNOSIS — E1121 Type 2 diabetes mellitus with diabetic nephropathy: Secondary | ICD-10-CM

## 2020-10-25 DIAGNOSIS — Z888 Allergy status to other drugs, medicaments and biological substances status: Secondary | ICD-10-CM

## 2020-10-25 DIAGNOSIS — L98499 Non-pressure chronic ulcer of skin of other sites with unspecified severity: Secondary | ICD-10-CM | POA: Diagnosis not present

## 2020-10-25 DIAGNOSIS — N184 Chronic kidney disease, stage 4 (severe): Secondary | ICD-10-CM | POA: Diagnosis present

## 2020-10-25 DIAGNOSIS — L0390 Cellulitis, unspecified: Secondary | ICD-10-CM | POA: Diagnosis not present

## 2020-10-25 DIAGNOSIS — M7989 Other specified soft tissue disorders: Secondary | ICD-10-CM | POA: Diagnosis not present

## 2020-10-25 DIAGNOSIS — I10 Essential (primary) hypertension: Secondary | ICD-10-CM | POA: Diagnosis not present

## 2020-10-25 DIAGNOSIS — W19XXXA Unspecified fall, initial encounter: Secondary | ICD-10-CM | POA: Diagnosis not present

## 2020-10-25 DIAGNOSIS — M86171 Other acute osteomyelitis, right ankle and foot: Secondary | ICD-10-CM | POA: Diagnosis not present

## 2020-10-25 DIAGNOSIS — M869 Osteomyelitis, unspecified: Secondary | ICD-10-CM

## 2020-10-25 DIAGNOSIS — N183 Chronic kidney disease, stage 3 unspecified: Secondary | ICD-10-CM

## 2020-10-25 DIAGNOSIS — I5032 Chronic diastolic (congestive) heart failure: Secondary | ICD-10-CM | POA: Diagnosis present

## 2020-10-25 DIAGNOSIS — E440 Moderate protein-calorie malnutrition: Secondary | ICD-10-CM | POA: Diagnosis present

## 2020-10-25 DIAGNOSIS — I493 Ventricular premature depolarization: Secondary | ICD-10-CM | POA: Diagnosis present

## 2020-10-25 LAB — CBC WITH DIFFERENTIAL/PLATELET
Abs Immature Granulocytes: 0.02 10*3/uL (ref 0.00–0.07)
Basophils Absolute: 0 10*3/uL (ref 0.0–0.1)
Basophils Relative: 1 %
Eosinophils Absolute: 0.3 10*3/uL (ref 0.0–0.5)
Eosinophils Relative: 4 %
HCT: 28 % — ABNORMAL LOW (ref 39.0–52.0)
Hemoglobin: 8.9 g/dL — ABNORMAL LOW (ref 13.0–17.0)
Immature Granulocytes: 0 %
Lymphocytes Relative: 8 %
Lymphs Abs: 0.6 10*3/uL — ABNORMAL LOW (ref 0.7–4.0)
MCH: 27.1 pg (ref 26.0–34.0)
MCHC: 31.8 g/dL (ref 30.0–36.0)
MCV: 85.1 fL (ref 80.0–100.0)
Monocytes Absolute: 0.5 10*3/uL (ref 0.1–1.0)
Monocytes Relative: 7 %
Neutro Abs: 6 10*3/uL (ref 1.7–7.7)
Neutrophils Relative %: 80 %
Platelets: 188 10*3/uL (ref 150–400)
RBC: 3.29 MIL/uL — ABNORMAL LOW (ref 4.22–5.81)
RDW: 16.9 % — ABNORMAL HIGH (ref 11.5–15.5)
WBC: 7.4 10*3/uL (ref 4.0–10.5)
nRBC: 0 % (ref 0.0–0.2)

## 2020-10-25 LAB — URINALYSIS, ROUTINE W REFLEX MICROSCOPIC
Bacteria, UA: NONE SEEN
Bilirubin Urine: NEGATIVE
Glucose, UA: 150 mg/dL — AB
Ketones, ur: NEGATIVE mg/dL
Leukocytes,Ua: NEGATIVE
Nitrite: NEGATIVE
Protein, ur: >=300 mg/dL — AB
Specific Gravity, Urine: 1.01 (ref 1.005–1.030)
pH: 6 (ref 5.0–8.0)

## 2020-10-25 LAB — COMPREHENSIVE METABOLIC PANEL
ALT: 26 U/L (ref 0–44)
AST: 23 U/L (ref 15–41)
Albumin: 2.8 g/dL — ABNORMAL LOW (ref 3.5–5.0)
Alkaline Phosphatase: 131 U/L — ABNORMAL HIGH (ref 38–126)
Anion gap: 8 (ref 5–15)
BUN: 47 mg/dL — ABNORMAL HIGH (ref 8–23)
CO2: 22 mmol/L (ref 22–32)
Calcium: 8.6 mg/dL — ABNORMAL LOW (ref 8.9–10.3)
Chloride: 107 mmol/L (ref 98–111)
Creatinine, Ser: 3.79 mg/dL — ABNORMAL HIGH (ref 0.61–1.24)
GFR, Estimated: 17 mL/min — ABNORMAL LOW (ref >60)
Glucose, Bld: 151 mg/dL — ABNORMAL HIGH (ref 70–99)
Potassium: 4.2 mmol/L (ref 3.5–5.1)
Sodium: 137 mmol/L (ref 135–145)
Total Bilirubin: 0.7 mg/dL (ref 0.3–1.2)
Total Protein: 7.2 g/dL (ref 6.5–8.1)

## 2020-10-25 LAB — SEDIMENTATION RATE: Sed Rate: 69 mm/hr — ABNORMAL HIGH (ref 0–16)

## 2020-10-25 LAB — LACTIC ACID, PLASMA
Lactic Acid, Venous: 0.9 mmol/L (ref 0.5–1.9)
Lactic Acid, Venous: 0.8 mmol/L (ref 0.5–1.9)

## 2020-10-25 LAB — BRAIN NATRIURETIC PEPTIDE: B Natriuretic Peptide: 351 pg/mL — ABNORMAL HIGH (ref 0.0–100.0)

## 2020-10-25 LAB — C-REACTIVE PROTEIN: CRP: 3.4 mg/dL — ABNORMAL HIGH (ref <1.0)

## 2020-10-25 LAB — CBG MONITORING, ED: Glucose-Capillary: 148 mg/dL — ABNORMAL HIGH (ref 70–99)

## 2020-10-25 MED ORDER — VANCOMYCIN HCL IN DEXTROSE 1-5 GM/200ML-% IV SOLN
1000.0000 mg | Freq: Once | INTRAVENOUS | Status: AC
  Administered 2020-10-25: 1000 mg via INTRAVENOUS
  Filled 2020-10-25: qty 200

## 2020-10-25 MED ORDER — VANCOMYCIN HCL 750 MG/150ML IV SOLN
750.0000 mg | INTRAVENOUS | Status: DC
  Filled 2020-10-25: qty 150

## 2020-10-25 NOTE — ED Provider Notes (Signed)
[facility] EMERGENCY DEPARTMENT Provider Note   CSN: [id number] Arrival date & time: 10/25/20  1159     History Chief Complaint  Patient presents with  . Leg Swelling    Danny [name] is a 65 y.o. male with PMHx HTN, Diabetes, CKD stage IV who presents to the ED today via EMS with complaint of bilateral leg swelling/wounds to BLEs for the past several weeks.  States that he was seen in the ED on 04/03 for generalized weakness.  At that time the provider who saw him thought that he had cellulitis of his bilateral lower extremities was started on doxycycline.  He states he finished the entire course however he lives by himself and has continued to be generally weak in his legs causing him to be unable to go to his PCP or wound clinic for his legs. Pt also complains of SOB however states it is no worse than his normal baseline. No complaint of chest pain, fevers, chills, or any other associated symptoms.   The history is provided by the patient and medical records.       Past Medical History:  Diagnosis Date  . Anemia   . Arthritis   . CKD (chronic kidney disease), stage IV ([place])   . Diabetes mellitus without complication ([place])   . Foot ulcer ([place])   . Hypertension   . Urinary retention     Patient Active Problem List   Diagnosis Date Noted  . Normocytic anemia 09/13/2020  . Class 2 obesity   . Respiratory failure ([place]) 03/23/2020  . UTI (urinary tract infection), bacterial 01/22/2020  . Acute on chronic anemia 01/16/2020  . Benign prostatic hyperplasia with urinary obstruction 01/15/2020  . AKI (acute kidney injury) ([place]) 07/08/2019  . Hypoxia 07/08/2019  . Pressure injury of skin 07/08/2019  . Edema of both lower extremities   . Anasarca 03/23/2019  . Bladder outlet obstruction 03/23/2019  . Yeast infection of the skin 03/16/2019  . Diabetic ulcer of left foot ([place]) 03/15/2019  . Diabetic ulcer of right ankle ([place]) 03/15/2019  . Hypertension associated with stage 3  chronic kidney disease due to type 2 diabetes mellitus ([place]) 03/09/2019  . Controlled type 2 diabetes mellitus with stage 3 chronic kidney disease, with long-term current use of insulin ([place]) 03/09/2019  . Dyslipidemia associated with type 2 diabetes mellitus ([place]) 03/09/2019  . Chronic gout due to renal impairment without tophus 03/09/2019  . Emphysematous cystitis 03/05/2019  . Bilateral hydronephrosis   . Bilateral cellulitis of lower leg 03/03/2019  . Urinary retention 03/03/2019  . Klebsiella Cystitis 03/03/2019  . UTI due to Klebsiella species 03/03/2019  . Plantar ulcer of left foot ([place]) 03/03/2019  . Acute on chronic diastolic (congestive) heart failure ([place]) 02/25/2019  . Acute respiratory failure with hypoxia ([place]) 02/25/2019  . Acute renal failure superimposed on stage 3 chronic kidney disease ([place]) 02/24/2019  . Congestive heart failure ([place]) 02/24/2019  . Dyspnea 02/23/2019  . Essential hypertension 02/23/2019  . Diabetes mellitus ([place]) 02/23/2019  . CKD (chronic kidney disease) 02/23/2019  . Psoriasis 02/23/2019    Past Surgical History:  Procedure Laterality Date  . ANKLE SURGERY Right   . CHOLECYSTECTOMY    . FOOT SURGERY Right   . TRANSURETHRAL RESECTION OF PROSTATE N/A 01/15/2020   Procedure: TRANSURETHRAL RESECTION OF THE PROSTATE (TURP)  with General anesthesia and spinal;  Surgeon: Cleon Gustin, MD;  Location: [facility];  Service: Urology;  Laterality: N/A;  Family History  Problem Relation Age of Onset  . Diabetes Mother   . Heart attack Mother   . Heart attack Father   . Diabetes Brother     Social History   Tobacco Use  . Smoking status: Never Smoker  . Smokeless tobacco: Never Used  Vaping Use  . Vaping Use: Never used  Substance Use Topics  . Alcohol use: No  . Drug use: No    Home Medications Prior to Admission medications   Medication Sig Start Date End Date Taking? Authorizing Provider  ACCU-CHEK SMARTVIEW test strip 1 each  by Other route as needed.  05/15/19   [provider]  acetaminophen (TYLENOL) 500 MG tablet Take 1,000 mg by mouth every 6 (six) hours as needed for moderate pain or headache.    [provider]  amLODipine (NORVASC) 10 MG tablet Take 10 mg by mouth daily.  10/14/19   [provider]  Ascorbic Acid (VITAMIN C) 500 MG CHEW Chew 1,000 mg by mouth daily.    [provider]  aspirin 325 MG tablet Take 325 mg by mouth daily.    [provider]  cholecalciferol (VITAMIN D3) 25 MCG (1000 UNIT) tablet Take 1,000 Units by mouth daily.    [provider]  doxycycline (VIBRAMYCIN) 100 MG capsule Take 1 capsule (100 mg total) by mouth 2 (two) times daily. 09/26/20   Noemi Chapel, MD  epoetin alfa-epbx (RETACRIT) 93570 UNIT/ML injection 8,000 Units every 14 (fourteen) days. 08/17/20   Liana Gerold, MD  ferrous sulfate 325 (65 FE) MG tablet Take 325 mg by mouth 2 (two) times daily with a meal.    [provider]  hydrALAZINE (APRESOLINE) 100 MG tablet Take by mouth. 09/03/20 09/03/21  [provider]  ketotifen (ALLERGY EYE DROPS) 0.025 % ophthalmic solution Place 1 drop into both eyes daily as needed (allergies/dry eyes). 03/30/20   Barton Dubois, MD  Magnesium 100 MG CAPS Take 100 mg by mouth daily.     [provider]  metoprolol succinate (TOPROL-XL) 100 MG 24 hr tablet Take 1 tablet (100 mg total) by mouth every evening. 03/20/19   Gerlene Fee, NP  Multiple Vitamin (MULTIVITAMIN WITH MINERALS) TABS tablet Take 1 tablet by mouth daily.    [provider]  NOVOLIN 70/30 RELION (70-30) 100 UNIT/ML injection Inject 20-100 Units into the skin in the morning and at bedtime.  08/13/19   [provider]  Omega-3 Fatty Acids (FISH OIL PO) Take 2,400 mg by mouth 2 (two) times daily.    [provider]  pravastatin (PRAVACHOL) 80 MG tablet Take 1 tablet (80 mg total) by mouth every evening. 03/20/19   Gerlene Fee, NP  torsemide (DEMADEX) 20 MG tablet Take 2 tablets by mouth daily; okay to use extra 20 mg daily if patient gain more than 3 pounds overnight or more than 5 pounds in a week. 03/30/20   Barton Dubois, MD  vitamin B-12 (CYANOCOBALAMIN) 1000 MCG tablet Take 1,000 mcg by mouth daily.    [provider]  Zinc 30 MG CAPS Take by mouth.    [provider]    Allergies    Dust mite extract, Prednisone, and Rocephin [ceftriaxone]  Review of Systems   Review of Systems  Constitutional: Negative for chills and fever.  Respiratory: Positive for shortness of breath (baseline).   Cardiovascular: Positive for leg swelling.  Skin: Positive for color change (redness) and wound.  All other systems reviewed  and are negative.   Physical Exam Updated Vital Signs BP 128/84   Pulse (!) 106   Temp 98 F (36.7 C) (Oral)   Resp 18   Ht 6\' 1"  (1.854 m)   Wt 126.5 kg   SpO2 94%   BMI 36.79 kg/m   Physical Exam Vitals and nursing note reviewed.  Constitutional:      Appearance: He is not ill-appearing or diaphoretic.  HENT:     Head: Normocephalic and atraumatic.  Eyes:     Conjunctiva/sclera: Conjunctivae normal.  Cardiovascular:     Rate and Rhythm: Regular rhythm. Tachycardia present.  Pulmonary:     Effort: Pulmonary effort is normal.     Breath sounds: Normal breath sounds. No wheezing, rhonchi or rales.  Abdominal:     Palpations: Abdomen is soft.     Tenderness: There is no abdominal tenderness.  Musculoskeletal:     Cervical back: Neck supple.     Comments: See photos below. 2+ pitting edema to BLEs with chronic venous stasis. Erythema is present with mild increased warmth to the touch. Socks were initially on feet with drainage noted to the socks and crusting around the cuff of the soak; had to be peeled off of feet. Ulceration noted to right lateral malleolus with surrounding fungal infection/foul odor.   Skin:    General: Skin is warm and dry.   Neurological:     Mental Status: He is alert.          ED Results / Procedures / Treatments   Labs (all labs ordered are listed, but only abnormal results are displayed) Labs Reviewed  COMPREHENSIVE METABOLIC PANEL - Abnormal; Notable for the following components:      Result Value   Glucose, Bld 151 (*)    BUN 47 (*)    Creatinine, Ser 3.79 (*)    Calcium 8.6 (*)    Albumin 2.8 (*)    Alkaline Phosphatase 131 (*)    GFR, Estimated 17 (*)    All other components within normal limits  CBC WITH DIFFERENTIAL/PLATELET - Abnormal; Notable for the following components:   RBC 3.29 (*)    Hemoglobin 8.9 (*)    HCT 28.0 (*)    RDW 16.9 (*)    Lymphs Abs 0.6 (*)    All other components within normal limits  URINALYSIS, ROUTINE W REFLEX MICROSCOPIC - Abnormal; Notable for the following components:   Glucose, UA 150 (*)    Hgb urine dipstick SMALL (*)    Protein, ur >=300 (*)    All other components within normal limits  BRAIN NATRIURETIC PEPTIDE - Abnormal; Notable for the following components:   B Natriuretic Peptide 351.0 (*)    All other components within normal limits  CBG MONITORING, ED - Abnormal; Notable for the following components:   Glucose-Capillary 148 (*)    All other components within normal limits  SARS CORONAVIRUS 2 (TAT 6-24 HRS)  AEROBIC CULTURE W GRAM STAIN (SUPERFICIAL SPECIMEN)  CULTURE, BLOOD (ROUTINE X 2)  CULTURE, BLOOD (ROUTINE X 2)  AEROBIC CULTURE W GRAM STAIN (SUPERFICIAL SPECIMEN)  LACTIC ACID, PLASMA  LACTIC ACID, PLASMA  SEDIMENTATION RATE  C-REACTIVE PROTEIN    EKG None  Radiology DG Chest 2 View  Result Date: 10/25/2020 CLINICAL DATA:  CHF with leg swelling and ulcer to right lateral malleolus. EXAM: CHEST - 2 VIEW COMPARISON:  09/26/20 FINDINGS: There is mild cardiac enlargement, stable from previous exam. No pleural effusion or interstitial edema identified. No airspace  densities. IMPRESSION: No acute findings.  No evidence for  congestive heart failure. Electronically Signed   By: Kerby Moors M.D.   On: 10/25/2020 14:21   DG Ankle Complete Right  Result Date: 10/25/2020 CLINICAL DATA:  CHF, lower extremity swelling, lateral malleolar ulcer with concern for infection, no reported injury EXAM: RIGHT ANKLE - COMPLETE 3+ VIEW COMPARISON:  09/26/2020 right ankle radiographs FINDINGS: Diffuse right ankle soft tissue swelling. Small soft tissue defect at the lateral malleolus compatible with reported ulcer. Subtle loss of cortical line at the lateral distal margin of the lateral malleolus suspicious for early bony erosion. No fracture. No subluxation. Severe tibiotalar osteoarthritis. Intact staples at the lateral aspect of the calcaneus. Partially visualized intact screw at the base of fifth metatarsal. No focal osseous lesions. IMPRESSION: Diffuse right ankle soft tissue swelling. Small soft tissue defect at the lateral malleolus compatible with reported ulcer. Subtle loss of cortical line at the lateral distal margin of the lateral malleolus, suspicious for early bony erosion due to osteomyelitis. Consider MRI of the right ankle without IV contrast for further evaluation as clinically warranted. Electronically Signed   By: Ilona Sorrel M.D.   On: 10/25/2020 14:24    Procedures Procedures   Medications Ordered in ED Medications  vancomycin (VANCOCIN) IVPB 1000 mg/200 mL premix (0 mg Intravenous Stopped 10/25/20 1736)    ED Course  I have reviewed the triage vital signs and the nursing notes.  Pertinent labs & imaging results that were available during my care of the patient were reviewed by me and considered in my medical decision making (see chart for details).    MDM Rules/Calculators/A&P                          65 year old male who presents to the ED today complaining of continued bilateral leg swelling and wounds to his legs for the past several weeks.  Seen in the ED on 04/03 for same and treated with outpatient  doxycycline which she finished without relief.  Has been unable to go to PCP or wound care appointments due to generalized weakness.  On arrival to the ED today via EMS patient is initially tachycardic.  Remainder vitals stable.  Nontoxic appearing.  He has socks on his feet during exam that have had to be peeled off.  There is crusting as well as drainage that has likely been present for several weeks attached to the socks.  He has chronic venous stasis with erythema and slight increased warmth to touch to his bilateral lower extremities.  He does have an ulceration to the right lateral malleolus with surrounding fungal infection today.  Patient states he applied a cream and then placed socks which have not been removed in several weeks again.  She is diabetic.  Concern for worsening cellulitis versus possible osteo of the right ankle at this time.  Will work-up for same.   CBC without leukocytosis.  Hemoglobin stable at 8.9. CMP with a stable creatinine 3.79.  BUN also stable at 47.  Glucose 151, bicarb within normal limits at 22.  No gap. Lactic acid within normal limits at 0.9.  Had added on chest x-ray as well as BNP with history of CHF.  BNP stable at 351.  Chest x-ray without any acute pulmonary vascular congestion.  Xray of ankle: IMPRESSION:  Diffuse right ankle soft tissue swelling. Small soft tissue defect  at the lateral malleolus compatible with reported ulcer. Subtle loss  of cortical line at the lateral distal margin of the lateral  malleolus, suspicious for early bony erosion due to osteomyelitis.  Consider MRI of the right ankle without IV contrast for further  evaluation as clinically warranted.   Started pt on vancomycin with concern for osteomyelitis. Discussed case with [facility] Dr. Denton Brick who recommends MRI at this time to determine if pt requires admission here or at [facility]. Would like to be re-paged once MRI is complete.   Mri negative for osteo. Initial report on the  first image of the MRI; full report to be placed in the morning by [facility] rad.   Discussed case with Dr. Clearence Ped who agrees to accept patient for admission.   This note was prepared using Dragon voice recognition software and may include unintentional dictation errors due to the inherent limitations of voice recognition software.  Final Clinical Impression(s) / ED Diagnoses Final diagnoses:  Cellulitis of lower extremity, unspecified laterality  Diabetic ulcer of ankle ([place])  Osteomyelitis of right ankle, unspecified type [facility])    Rx / DC Orders ED Discharge Orders    None       Eustaquio Maize, PA-C 10/25/20 2047    Margette Fast, MD 10/26/20 1047

## 2020-10-25 NOTE — ED Notes (Signed)
PT given urinal and curtains placed beside bed.

## 2020-10-25 NOTE — ED Notes (Signed)
Pt in sitting up in bed, pt reports 2/10 leg pain, states that he doesn't need anything for pain at this time. Pt requests a warm blanket, blanket given.

## 2020-10-25 NOTE — ED Triage Notes (Signed)
Patient here for evaluation of wounds and swelling to bilateral lower extremities. Patient from home. Reports chronic SHOB upon exertion.

## 2020-10-25 NOTE — Progress Notes (Signed)
Pharmacy Antibiotic Note  Breccan Galant is a 65 y.o. male admitted on 10/25/2020 with cellulitis with possible osteomyelitis.  Pharmacy has been consulted for vancomycin dosing.  Plan: Vancomycin 1000mg  in ED then 750mg  IV Q24H. Goal AUC 400-550.  Expected AUC 535.  SCr 3.79.  Height: 6\' 1"  (185.4 cm) Weight: 126.5 kg (278 lb 14.1 oz) IBW/kg (Calculated) : 79.9  Temp (24hrs), Avg:97.8 F (36.6 C), Min:97.5 F (36.4 C), Max:98 F (36.7 C)  Recent Labs  Lab 10/25/20 1327 10/25/20 1519  WBC 7.4  --   CREATININE 3.79*  --   LATICACIDVEN 0.9 0.8    Estimated Creatinine Clearance: 26.4 mL/min (A) (by C-G formula based on SCr of 3.79 mg/dL (H)).    Allergies  Allergen Reactions  . Dust Mite Extract Itching and Other (See Comments)    Unknown reaction-potential shortness of breath  . Prednisone Nausea And Vomiting  . Rocephin [Ceftriaxone] Nausea And Vomiting    Thank you for allowing pharmacy to be a part of this patient's care.  Wynona Neat, PharmD, BCPS  10/25/2020 11:49 PM

## 2020-10-26 ENCOUNTER — Encounter (HOSPITAL_COMMUNITY): Payer: Self-pay | Admitting: Family Medicine

## 2020-10-26 DIAGNOSIS — L97319 Non-pressure chronic ulcer of right ankle with unspecified severity: Secondary | ICD-10-CM | POA: Diagnosis not present

## 2020-10-26 DIAGNOSIS — Z91199 Patient's noncompliance with other medical treatment and regimen due to unspecified reason: Secondary | ICD-10-CM

## 2020-10-26 DIAGNOSIS — N183 Chronic kidney disease, stage 3 unspecified: Secondary | ICD-10-CM | POA: Diagnosis not present

## 2020-10-26 DIAGNOSIS — Z9119 Patient's noncompliance with other medical treatment and regimen: Secondary | ICD-10-CM | POA: Diagnosis not present

## 2020-10-26 DIAGNOSIS — E440 Moderate protein-calorie malnutrition: Secondary | ICD-10-CM | POA: Diagnosis not present

## 2020-10-26 DIAGNOSIS — E1122 Type 2 diabetes mellitus with diabetic chronic kidney disease: Secondary | ICD-10-CM

## 2020-10-26 DIAGNOSIS — N1832 Chronic kidney disease, stage 3b: Secondary | ICD-10-CM

## 2020-10-26 DIAGNOSIS — L03115 Cellulitis of right lower limb: Secondary | ICD-10-CM | POA: Diagnosis not present

## 2020-10-26 DIAGNOSIS — L988 Other specified disorders of the skin and subcutaneous tissue: Secondary | ICD-10-CM | POA: Diagnosis not present

## 2020-10-26 DIAGNOSIS — N179 Acute kidney failure, unspecified: Secondary | ICD-10-CM

## 2020-10-26 DIAGNOSIS — I878 Other specified disorders of veins: Secondary | ICD-10-CM

## 2020-10-26 DIAGNOSIS — Z794 Long term (current) use of insulin: Secondary | ICD-10-CM

## 2020-10-26 LAB — CBC
HCT: 27 % — ABNORMAL LOW (ref 39.0–52.0)
Hemoglobin: 8.6 g/dL — ABNORMAL LOW (ref 13.0–17.0)
MCH: 27.4 pg (ref 26.0–34.0)
MCHC: 31.9 g/dL (ref 30.0–36.0)
MCV: 86 fL (ref 80.0–100.0)
Platelets: 171 10*3/uL (ref 150–400)
RBC: 3.14 MIL/uL — ABNORMAL LOW (ref 4.22–5.81)
RDW: 16.9 % — ABNORMAL HIGH (ref 11.5–15.5)
WBC: 7.2 10*3/uL (ref 4.0–10.5)
nRBC: 0 % (ref 0.0–0.2)

## 2020-10-26 LAB — COMPREHENSIVE METABOLIC PANEL
ALT: 22 U/L (ref 0–44)
AST: 20 U/L (ref 15–41)
Albumin: 2.6 g/dL — ABNORMAL LOW (ref 3.5–5.0)
Alkaline Phosphatase: 124 U/L (ref 38–126)
Anion gap: 7 (ref 5–15)
BUN: 47 mg/dL — ABNORMAL HIGH (ref 8–23)
CO2: 23 mmol/L (ref 22–32)
Calcium: 8.6 mg/dL — ABNORMAL LOW (ref 8.9–10.3)
Chloride: 108 mmol/L (ref 98–111)
Creatinine, Ser: 3.8 mg/dL — ABNORMAL HIGH (ref 0.61–1.24)
GFR, Estimated: 17 mL/min — ABNORMAL LOW (ref >60)
Glucose, Bld: 124 mg/dL — ABNORMAL HIGH (ref 70–99)
Potassium: 4.1 mmol/L (ref 3.5–5.1)
Sodium: 138 mmol/L (ref 135–145)
Total Bilirubin: 0.9 mg/dL (ref 0.3–1.2)
Total Protein: 6.8 g/dL (ref 6.5–8.1)

## 2020-10-26 LAB — HEMOGLOBIN A1C
Hgb A1c MFr Bld: 7.6 % — ABNORMAL HIGH (ref 4.8–5.6)
Mean Plasma Glucose: 171.42 mg/dL

## 2020-10-26 LAB — GLUCOSE, CAPILLARY
Glucose-Capillary: 118 mg/dL — ABNORMAL HIGH (ref 70–99)
Glucose-Capillary: 130 mg/dL — ABNORMAL HIGH (ref 70–99)
Glucose-Capillary: 146 mg/dL — ABNORMAL HIGH (ref 70–99)
Glucose-Capillary: 148 mg/dL — ABNORMAL HIGH (ref 70–99)
Glucose-Capillary: 163 mg/dL — ABNORMAL HIGH (ref 70–99)

## 2020-10-26 LAB — SARS CORONAVIRUS 2 (TAT 6-24 HRS): SARS Coronavirus 2: NEGATIVE

## 2020-10-26 LAB — MAGNESIUM: Magnesium: 2 mg/dL (ref 1.7–2.4)

## 2020-10-26 LAB — IRON AND TIBC
Iron: 30 ug/dL — ABNORMAL LOW (ref 45–182)
Saturation Ratios: 11 % — ABNORMAL LOW (ref 17.9–39.5)
TIBC: 263 ug/dL (ref 250–450)
UIBC: 233 ug/dL

## 2020-10-26 MED ORDER — INSULIN ASPART 100 UNIT/ML IJ SOLN: 0.0000 [IU] | Freq: Every day | INTRAMUSCULAR | Status: DC

## 2020-10-26 MED ORDER — SILVER NITRATE-POT NITRATE 75-25 % EX MISC
1.0000 "application " | Freq: Once | CUTANEOUS | Status: AC
  Administered 2020-10-26: 1 via TOPICAL
  Filled 2020-10-26: qty 10

## 2020-10-26 MED ORDER — HYDRALAZINE HCL 25 MG PO TABS
100.0000 mg | ORAL_TABLET | Freq: Three times a day (TID) | ORAL | Status: DC
  Administered 2020-10-26 – 2020-11-05 (×32): 100 mg via ORAL
  Filled 2020-10-26 (×32): qty 4

## 2020-10-26 MED ORDER — ACETAMINOPHEN 325 MG PO TABS
650.0000 mg | ORAL_TABLET | Freq: Four times a day (QID) | ORAL | Status: DC | PRN
  Administered 2020-11-05: 650 mg via ORAL
  Filled 2020-10-26: qty 2

## 2020-10-26 MED ORDER — LINEZOLID 600 MG PO TABS
600.0000 mg | ORAL_TABLET | Freq: Two times a day (BID) | ORAL | Status: DC
  Administered 2020-10-26 – 2020-10-27 (×3): 600 mg via ORAL
  Filled 2020-10-26 (×7): qty 1

## 2020-10-26 MED ORDER — PRAVASTATIN SODIUM 40 MG PO TABS
80.0000 mg | ORAL_TABLET | Freq: Every evening | ORAL | Status: DC
  Administered 2020-10-26 – 2020-11-05 (×11): 80 mg via ORAL
  Filled 2020-10-26 (×11): qty 2

## 2020-10-26 MED ORDER — VITAMIN D 25 MCG (1000 UNIT) PO TABS
1000.0000 [IU] | ORAL_TABLET | Freq: Every day | ORAL | Status: DC
  Administered 2020-10-26 – 2020-11-05 (×11): 1000 [IU] via ORAL
  Filled 2020-10-26 (×11): qty 1

## 2020-10-26 MED ORDER — HYDRALAZINE HCL 50 MG PO TABS
100.0000 mg | ORAL_TABLET | Freq: Three times a day (TID) | ORAL | Status: DC
  Filled 2020-10-26 (×7): qty 2

## 2020-10-26 MED ORDER — COLLAGENASE 250 UNIT/GM EX OINT
TOPICAL_OINTMENT | Freq: Every day | CUTANEOUS | Status: DC
  Filled 2020-10-26: qty 30

## 2020-10-26 MED ORDER — TORSEMIDE 20 MG PO TABS: 20.0000 mg | ORAL_TABLET | Freq: Every day | ORAL | Status: DC

## 2020-10-26 MED ORDER — HYDROCERIN EX CREA
TOPICAL_CREAM | Freq: Every day | CUTANEOUS | Status: DC
  Filled 2020-10-26: qty 113

## 2020-10-26 MED ORDER — ONDANSETRON HCL 4 MG/2ML IJ SOLN
4.0000 mg | Freq: Four times a day (QID) | INTRAMUSCULAR | Status: DC | PRN
  Administered 2020-10-28: 4 mg via INTRAVENOUS
  Filled 2020-10-26 (×2): qty 2

## 2020-10-26 MED ORDER — VITAMIN C 500 MG PO TABS
1000.0000 mg | ORAL_TABLET | Freq: Every day | ORAL | Status: DC
  Administered 2020-10-26 – 2020-11-05 (×11): 1000 mg via ORAL
  Filled 2020-10-26 (×25): qty 2

## 2020-10-26 MED ORDER — EPOETIN ALFA-EPBX 10000 UNIT/ML IJ SOLN: 8000.0000 [IU] | INTRAMUSCULAR | Status: DC

## 2020-10-26 MED ORDER — SODIUM CHLORIDE 0.9 % IV SOLN: INTRAVENOUS | Status: AC

## 2020-10-26 MED ORDER — AMLODIPINE BESYLATE 5 MG PO TABS
10.0000 mg | ORAL_TABLET | Freq: Every day | ORAL | Status: DC
  Administered 2020-10-26 – 2020-10-31 (×6): 10 mg via ORAL
  Filled 2020-10-26 (×6): qty 2

## 2020-10-26 MED ORDER — GLUCERNA SHAKE PO LIQD
237.0000 mL | Freq: Three times a day (TID) | ORAL | Status: DC
  Administered 2020-10-26 – 2020-11-03 (×21): 237 mL via ORAL

## 2020-10-26 MED ORDER — HEPARIN SODIUM (PORCINE) 5000 UNIT/ML IJ SOLN
5000.0000 [IU] | Freq: Three times a day (TID) | INTRAMUSCULAR | Status: DC
  Administered 2020-10-26 – 2020-11-05 (×32): 5000 [IU] via SUBCUTANEOUS
  Filled 2020-10-26 (×33): qty 1

## 2020-10-26 MED ORDER — OXYCODONE HCL 5 MG PO TABS: 5.0000 mg | ORAL_TABLET | ORAL | Status: DC | PRN

## 2020-10-26 MED ORDER — NYSTATIN 100000 UNIT/GM EX POWD
Freq: Three times a day (TID) | CUTANEOUS | Status: DC
  Administered 2020-10-26: 1 via TOPICAL
  Filled 2020-10-26 (×4): qty 15

## 2020-10-26 MED ORDER — TORSEMIDE 20 MG PO TABS
20.0000 mg | ORAL_TABLET | Freq: Every day | ORAL | Status: DC
  Administered 2020-10-27 – 2020-10-28 (×2): 20 mg via ORAL
  Filled 2020-10-26 (×2): qty 1

## 2020-10-26 MED ORDER — METOPROLOL SUCCINATE ER 50 MG PO TB24
100.0000 mg | ORAL_TABLET | Freq: Every evening | ORAL | Status: DC
  Administered 2020-10-26 – 2020-10-30 (×5): 100 mg via ORAL
  Filled 2020-10-26 (×6): qty 2

## 2020-10-26 MED ORDER — EPOETIN ALFA-EPBX 10000 UNIT/ML IJ SOLN
8000.0000 [IU] | INTRAMUSCULAR | Status: DC
  Administered 2020-10-26: 8000 [IU] via SUBCUTANEOUS
  Filled 2020-10-26: qty 1

## 2020-10-26 MED ORDER — EPOETIN ALFA-EPBX 10000 UNIT/ML IJ SOLN
8000.0000 [IU] | INTRAMUSCULAR | Status: DC
Start: 2020-10-26 — End: 2020-10-26
  Filled 2020-10-26: qty 1

## 2020-10-26 MED ORDER — INSULIN ASPART 100 UNIT/ML IJ SOLN
0.0000 [IU] | Freq: Three times a day (TID) | INTRAMUSCULAR | Status: DC
  Administered 2020-10-26 – 2020-10-27 (×4): 2 [IU] via SUBCUTANEOUS
  Administered 2020-10-27: 3 [IU] via SUBCUTANEOUS
  Administered 2020-10-28: 2 [IU] via SUBCUTANEOUS
  Administered 2020-10-28 – 2020-10-29 (×2): 3 [IU] via SUBCUTANEOUS
  Administered 2020-10-29 – 2020-11-02 (×4): 2 [IU] via SUBCUTANEOUS
  Administered 2020-11-02 – 2020-11-03 (×2): 3 [IU] via SUBCUTANEOUS
  Administered 2020-11-03 – 2020-11-04 (×2): 2 [IU] via SUBCUTANEOUS
  Administered 2020-11-04: 3 [IU] via SUBCUTANEOUS
  Administered 2020-11-05: 2 [IU] via SUBCUTANEOUS

## 2020-10-26 MED ORDER — ACETAMINOPHEN 650 MG RE SUPP: 650.0000 mg | Freq: Four times a day (QID) | RECTAL | Status: DC | PRN

## 2020-10-26 MED ORDER — INSULIN DETEMIR 100 UNIT/ML ~~LOC~~ SOLN
20.0000 [IU] | Freq: Every day | SUBCUTANEOUS | Status: DC
  Administered 2020-10-26 – 2020-11-04 (×11): 20 [IU] via SUBCUTANEOUS
  Filled 2020-10-26 (×14): qty 0.2

## 2020-10-26 MED ORDER — ONDANSETRON HCL 4 MG PO TABS
4.0000 mg | ORAL_TABLET | Freq: Four times a day (QID) | ORAL | Status: DC | PRN
  Administered 2020-11-05: 4 mg via ORAL
  Filled 2020-10-26: qty 1

## 2020-10-26 MED ORDER — ASPIRIN 325 MG PO TABS
325.0000 mg | ORAL_TABLET | Freq: Every day | ORAL | Status: DC
  Administered 2020-10-26 – 2020-11-05 (×11): 325 mg via ORAL
  Filled 2020-10-26 (×12): qty 1

## 2020-10-26 NOTE — TOC Initial Note (Signed)
Transition of Care [facility]) - Initial/Assessment Note    Patient Details  Name: Danny [name] MRN: [id number] Date of Birth: 1955-12-13  [facility]) CM/SW Contact:    Boneta Lucks, RN Phone Number: 10/26/2020, 3:55 PM  Clinical Narrative:    Patient admitted with cellulitis. PT recommended CIR. They did not approve due to insurance. TOC offered to send out for SNF bed offers. Patient states he has to go back to his apartment or he will lose it. He wants home health and states he will work with them this time. He will reach out to his sister for assistance.  Marjory Lies with [facility] sent into office for approval. TOC to follow.              Expected Discharge Plan: [place] Barriers to Discharge: Continued Medical Work up   Patient Goals and CMS Choice Patient states their goals for this hospitalization and ongoing recovery are:: to go home with home health. CMS Medicare.gov Compare Post Acute Care list provided to:: Patient Choice offered to / list presented to : Patient  Expected Discharge Plan and Services Expected Discharge Plan: [place]    Living arrangements for the past 2 months: Apartment                  HH Arranged: RN,PT [place] Agency: [facility] (now [facility]) Date [place]: 10/26/20 Time [place]: 4098 Representative spoke with at [place]: Verdie Shire  Prior Living Arrangements/Services Living arrangements for the past 2 months: Apartment Lives with:: Self   Do you feel safe going back to the place where you live?: Yes      Need for Family Participation in Patient Care: Yes (Comment) Care giver support system in place?: Yes (comment)   Criminal Activity/Legal Involvement Pertinent to Current Situation/Hospitalization: No - Comment as needed  Activities of Daily Living Home Assistive Devices/Equipment: [place] chair without back,Grab bars in shower,Grab bars around  toilet,Dentures (specify type),CBG Meter ADL Screening (condition at time of admission) Patient's cognitive ability adequate to safely complete daily activities?: Yes Is the patient deaf or have difficulty hearing?: No Does the patient have difficulty seeing, even when wearing glasses/contacts?: No Does the patient have difficulty concentrating, remembering, or making decisions?: No Patient able to express need for assistance with ADLs?: Yes Does the patient have difficulty dressing or bathing?: Yes Independently performs ADLs?: Yes (appropriate for developmental age) Does the patient have difficulty walking or climbing stairs?: Yes Weakness of Legs: Both Weakness of Arms/Hands: None  Permission Sought/Granted     Emotional Assessment     Affect (typically observed): Accepting,Pleasant Orientation: : Oriented to Self,Oriented to Place,Oriented to  Time,Oriented to Situation Alcohol / Substance Use: Not Applicable Psych Involvement: No (comment)  Admission diagnosis:  Cellulitis [L03.90] Diabetic ulcer of ankle ([place]) [J19.147, L97.309] Cellulitis of lower extremity, unspecified laterality [L03.119] Osteomyelitis of right ankle, unspecified type [facility]) [M86.9] Patient Active Problem List   Diagnosis Date Noted  . Moderate protein-calorie malnutrition ([place]) 10/26/2020  . Medical non-compliance 10/26/2020  . Cellulitis 10/25/2020  . Normocytic anemia 09/13/2020  . Class 2 obesity   . Respiratory failure ([place]) 03/23/2020  . UTI (urinary tract infection), bacterial 01/22/2020  . Acute on chronic anemia 01/16/2020  . Benign prostatic hyperplasia with urinary obstruction 01/15/2020  . AKI (acute kidney injury) ([place]) 07/08/2019  . Hypoxia 07/08/2019  . Pressure injury of skin 07/08/2019  . Edema of both  lower extremities   . Anasarca 03/23/2019  . Bladder outlet obstruction 03/23/2019  . Yeast infection of the skin 03/16/2019  . Diabetic ulcer of left foot ([place]) 03/15/2019  .  Diabetic ulcer of right ankle ([place]) 03/15/2019  . Hypertension associated with stage 3 chronic kidney disease due to type 2 diabetes mellitus ([place]) 03/09/2019  . Controlled type 2 diabetes mellitus with stage 3 chronic kidney disease, with long-term current use of insulin ([place]) 03/09/2019  . Dyslipidemia associated with type 2 diabetes mellitus ([place]) 03/09/2019  . Chronic gout due to renal impairment without tophus 03/09/2019  . Emphysematous cystitis 03/05/2019  . Bilateral hydronephrosis   . Bilateral cellulitis of lower leg 03/03/2019  . Urinary retention 03/03/2019  . Klebsiella Cystitis 03/03/2019  . UTI due to Klebsiella species 03/03/2019  . Plantar ulcer of left foot ([place]) 03/03/2019  . Acute on chronic diastolic (congestive) heart failure ([place]) 02/25/2019  . Acute respiratory failure with hypoxia ([place]) 02/25/2019  . Acute renal failure superimposed on stage 3 chronic kidney disease ([place]) 02/24/2019  . Congestive heart failure ([place]) 02/24/2019  . Dyspnea 02/23/2019  . Essential hypertension 02/23/2019  . Diabetes mellitus ([place]) 02/23/2019  . CKD (chronic kidney disease) 02/23/2019  . Psoriasis 02/23/2019   PCP:  Celene Squibb, MD Pharmacy:   [place], [place] - [phone] [place] [address] [phone] [place] [address] Phone: [phone] Fax: [phone]    Readmission Risk Interventions Readmission Risk Prevention Plan 03/24/2020 07/11/2019 03/05/2019  Transportation Screening Complete Complete Complete  PCP or Specialist Appt within 3-5 Days Not Complete - Not Complete  Not Complete comments - - Going to facility and will be seen by facility medical director.  [place] or Home Care Consult Complete - Complete  Social Work Consult for Recovery Care Planning/Counseling Complete - Complete  Palliative Care Screening Not Applicable - Not Applicable  Medication Review (RN Care Manager) Complete Complete Complete  PCP or Specialist appointment within 3-5 days  of discharge - Not Complete -  [place] or Home Care Consult - Complete -  SW Recovery Care/Counseling Consult - Complete -  Palliative Care Screening - Not Complete -  [place] - Complete -  Some recent data might be hidden

## 2020-10-26 NOTE — NC FL2 (Signed)
[place] LEVEL OF CARE SCREENING TOOL     IDENTIFICATION  Patient Name: Danny [name] Birthdate: 06-Apr-1956 Sex: male Admission Date (Current Location): 10/25/2020  [facility] and [place] Number:  [employer] and Address:         Provider Number: [id number]  Attending Physician Name and Address:  Deatra James, MD  Relative Name and Phone Number:  Hulan Amato - sister - [phone]    Current Level of Care: Hospital Recommended Level of Care: [place] Prior Approval Number:    Date Approved/Denied:   PASRR Number: [id number]  Discharge Plan: SNF    Current Diagnoses: Patient Active Problem List   Diagnosis Date Noted  . Moderate protein-calorie malnutrition ([place]) 10/26/2020  . Medical non-compliance 10/26/2020  . Cellulitis 10/25/2020  . Normocytic anemia 09/13/2020  . Class 2 obesity   . Respiratory failure ([place]) 03/23/2020  . UTI (urinary tract infection), bacterial 01/22/2020  . Acute on chronic anemia 01/16/2020  . Benign prostatic hyperplasia with urinary obstruction 01/15/2020  . AKI (acute kidney injury) ([place]) 07/08/2019  . Hypoxia 07/08/2019  . Pressure injury of skin 07/08/2019  . Edema of both lower extremities   . Anasarca 03/23/2019  . Bladder outlet obstruction 03/23/2019  . Yeast infection of the skin 03/16/2019  . Diabetic ulcer of left foot ([place]) 03/15/2019  . Diabetic ulcer of right ankle ([place]) 03/15/2019  . Hypertension associated with stage 3 chronic kidney disease due to type 2 diabetes mellitus ([place]) 03/09/2019  . Controlled type 2 diabetes mellitus with stage 3 chronic kidney disease, with long-term current use of insulin ([place]) 03/09/2019  . Dyslipidemia associated with type 2 diabetes mellitus ([place]) 03/09/2019  . Chronic gout due to renal impairment without tophus 03/09/2019  . Emphysematous cystitis 03/05/2019  . Bilateral hydronephrosis   . Bilateral cellulitis of lower leg 03/03/2019  .  Urinary retention 03/03/2019  . Klebsiella Cystitis 03/03/2019  . UTI due to Klebsiella species 03/03/2019  . Plantar ulcer of left foot ([place]) 03/03/2019  . Acute on chronic diastolic (congestive) heart failure ([place]) 02/25/2019  . Acute respiratory failure with hypoxia ([place]) 02/25/2019  . Acute renal failure superimposed on stage 3 chronic kidney disease ([place]) 02/24/2019  . Congestive heart failure ([place]) 02/24/2019  . Dyspnea 02/23/2019  . Essential hypertension 02/23/2019  . Diabetes mellitus ([place]) 02/23/2019  . CKD (chronic kidney disease) 02/23/2019  . Psoriasis 02/23/2019    Orientation RESPIRATION BLADDER Height & Weight     Self,Time,Situation,Place  Normal Continent Weight: 126.5 kg Height:  6\' 1"  (185.4 cm)  BEHAVIORAL SYMPTOMS/MOOD NEUROLOGICAL BOWEL NUTRITION STATUS      Continent Diet (See DC summary)  AMBULATORY STATUS COMMUNICATION OF NEEDS Skin   Extensive Assist Verbally Other (Comment) (Right lower cellulitis)                       Personal Care Assistance Level of Assistance  Bathing,Dressing,Feeding Bathing Assistance: Maximum assistance Feeding assistance: Limited assistance Dressing Assistance: Maximum assistance     Functional Limitations Info  Sight,Hearing,Speech Sight Info: Adequate Hearing Info: Adequate Speech Info: Adequate    SPECIAL CARE FACTORS FREQUENCY  PT (By licensed PT)     PT Frequency: 5 times              Contractures Contractures Info: Not present    Additional Factors Info  Code Status,Allergies Code Status Info: FULL Allergies Info: Dust, prednisone, rocephin  Current Medications (10/26/2020):  This is the current hospital active medication list Current Facility-Administered Medications  Medication Dose Route Frequency Provider Last Rate Last Admin  . 0.9 %  sodium chloride infusion   Intravenous Continuous Skipper Cliche A, MD 50 mL/hr at 10/26/20 1153 New Bag at 10/26/20 1153  . acetaminophen  (TYLENOL) tablet 650 mg  650 mg Oral Q6H PRN Zierle-Ghosh, Asia B, DO       Or  . acetaminophen (TYLENOL) suppository 650 mg  650 mg Rectal Q6H PRN Zierle-Ghosh, Asia B, DO      . amLODipine (NORVASC) tablet 10 mg  10 mg Oral Daily Zierle-Ghosh, Asia B, DO   10 mg at 10/26/20 0831  . aspirin tablet 325 mg  325 mg Oral Daily Zierle-Ghosh, Asia B, DO   325 mg at 10/26/20 0831  . cholecalciferol (VITAMIN D3) tablet 1,000 Units  1,000 Units Oral Daily Zierle-Ghosh, Asia B, DO   1,000 Units at 10/26/20 0831  . collagenase (SANTYL) ointment   Topical Daily Zierle-Ghosh, Asia B, DO      . epoetin alfa-epbx (RETACRIT) injection 8,000 Units  8,000 Units Subcutaneous Q14 Days Skipper Cliche A, MD   8,000 Units at 10/26/20 1246  . feeding supplement (GLUCERNA SHAKE) (GLUCERNA SHAKE) liquid 237 mL  237 mL Oral TID BM Zierle-Ghosh, Asia B, DO   237 mL at 10/26/20 0837  . heparin injection 5,000 Units  5,000 Units Subcutaneous Q8H Zierle-Ghosh, Asia B, DO   5,000 Units at 10/26/20 1529  . hydrALAZINE (APRESOLINE) tablet 100 mg  100 mg Oral TID Skipper Cliche A, MD   100 mg at 10/26/20 1153  . hydrocerin (EUCERIN) cream   Topical Daily Zierle-Ghosh, Asia B, DO      . insulin aspart (novoLOG) injection 0-15 Units  0-15 Units Subcutaneous TID WC Zierle-Ghosh, Asia B, DO   2 Units at 10/26/20 1232  . insulin aspart (novoLOG) injection 0-5 Units  0-5 Units Subcutaneous QHS Zierle-Ghosh, Asia B, DO      . insulin detemir (LEVEMIR) injection 20 Units  20 Units Subcutaneous QHS Zierle-Ghosh, Asia B, DO   20 Units at 10/26/20 0135  . linezolid (ZYVOX) tablet 600 mg  600 mg Oral Q12H Shahmehdi, Seyed A, MD   600 mg at 10/26/20 1232  . metoprolol succinate (TOPROL-XL) 24 hr tablet 100 mg  100 mg Oral QPM Zierle-Ghosh, Asia B, DO      . nystatin (MYCOSTATIN/NYSTOP) topical powder   Topical TID Zierle-Ghosh, Asia B, DO   1 application at 36/64/40 0134  . ondansetron (ZOFRAN) tablet 4 mg  4 mg Oral Q6H PRN Zierle-Ghosh,  Asia B, DO       Or  . ondansetron (ZOFRAN) injection 4 mg  4 mg Intravenous Q6H PRN Zierle-Ghosh, Asia B, DO      . oxyCODONE (Oxy IR/ROXICODONE) immediate release tablet 5 mg  5 mg Oral Q4H PRN Zierle-Ghosh, Asia B, DO      . pravastatin (PRAVACHOL) tablet 80 mg  80 mg Oral QPM Zierle-Ghosh, Asia B, DO      . [START ON 10/27/2020] torsemide (DEMADEX) tablet 20 mg  20 mg Oral Daily Shahmehdi, Seyed A, MD      . vitamin C (ASCORBIC ACID) tablet 1,000 mg  1,000 mg Oral Daily Zierle-Ghosh, Asia B, DO   1,000 mg at 10/26/20 0831     Discharge Medications: Please see discharge summary for a list of discharge medications.  Relevant Imaging Results:  Relevant Lab Results:   Additional  Information ZJ#[id number]  Boneta Lucks, RN

## 2020-10-26 NOTE — Progress Notes (Signed)
Rehab Admissions Coordinator Note:  Patient was screened by Cleatrice Burke for appropriateness for an Inpatient Acute Rehab Consult per therapy recommendations. Noted patient with [facility]. Per payor trends, insurance unlikely to approve inpatient acute rehab for this diagnosis of BLE cellulitis. Other rehab venues need to be pursued.Cleatrice Burke RN MSN 10/26/2020, 2:12 PM  I can be reached at [phone].

## 2020-10-26 NOTE — Evaluation (Signed)
Physical Therapy Evaluation Patient Details Name: Danny [name] MRN: [id number] DOB: [birth year] Today's Date: 10/26/2020   History of Present Illness  Danny [name] is a 65 y.o. male presents with c/o leg swelling and weakness, venous stasis changes to legs for years. R ankle MRI negaitve for osteomyelitis. PMH: HTN, diabetes, CKD, anemia, TURP 12/2019    Clinical Impression  Pt admitted with above diagnosis. At baseline, pt transferring and mobilizing around home in w/c independently, sister completing household chores intermittently. Pt currently unable to perform STS rep with therapist, RN stated pt able to stand earlier this morning with them. Pt able to scoot laterally on EOB, but no drop arm recliner or BSC available so used STEDY for stand pivot transfer. Pt pleasant, very motivated, currently requiring 24hr assist, but states no family support at home due to recent "falling out" with sister. Pt would benefit from OT eval for ADLs and CIR for strengthening prior to d/c home alone. Pt currently with functional limitations due to the deficits listed below (see PT Problem List). Pt will benefit from skilled PT to increase their independence and safety with mobility to allow discharge to the venue listed below.       Follow Up Recommendations CIR     Equipment Recommendations  None recommended by PT    Recommendations for Other Services OT consult     Precautions / Restrictions Precautions Precautions: Fall Precaution Comments: wounds Restrictions Weight Bearing Restrictions: No      Mobility  Bed Mobility  General bed mobility comments: sitting EOB upon arrival    Transfers Overall transfer level: Needs assistance Equipment used: Rolling walker (2 wheeled) Transfers: Sit to/from Stand    General transfer comment: attempted STS with max A+1 x5 attempts but unsuccesful; no drop arm recliner or BSC available for attempted slide/lateral scoot transfer so completed stand pivot transfer  with STEDY  Ambulation/Gait  General Gait Details: w/c baseline  Stairs            Wheelchair Mobility    Modified Rankin (Stroke Patients Only)       Balance Overall balance assessment: Needs assistance   Sitting balance-Leahy Scale: Good Sitting balance - Comments: seated EOB        Pertinent Vitals/Pain Pain Assessment: No/denies pain    Home Living Family/patient expects to be discharged to:: Private residence Living Arrangements: Alone Available Help at Discharge:  (was sister, unsure now?) Type of Home: Apartment Home Access: Ramped entrance     Home Layout: One level Home Equipment: Walker - 4 wheels;Shower seat;Bedside commode;Walker - 2 wheels;Wheelchair - manual Additional Comments: Pt's sister previously assisting, but "had a falling out yesterday" so no longer can rely on her to assist    Prior Function Level of Independence: Independent with assistive device(s)  Comments: pt reports independent with transfers, bathing, dressing, fixing small meals, using manual w/c in the home, uses [employer] for transportation. Pt reports sister was doing occasional household chores and grocery shopping; he will now order groceries online.     Hand Dominance   Dominant Hand: Right    Extremity/Trunk Assessment   Upper Extremity Assessment Upper Extremity Assessment: Overall WFL for tasks assessed    Lower Extremity Assessment Lower Extremity Assessment: Generalized weakness (venous stasis changes to BLE, RN notified of draining wound on RLE)    Cervical / Trunk Assessment Cervical / Trunk Assessment: Normal  Communication   Communication: No difficulties  Cognition Arousal/Alertness: Awake/alert Behavior During Therapy: WFL for tasks assessed/performed Overall Cognitive Status:  Within Functional Limits for tasks assessed    General Comments: Pt pleasant, telling jokes throughout session, motivated to regain independence.      General Comments       Exercises     Assessment/Plan    PT Assessment Patient needs continued PT services  PT Problem List Decreased strength;Decreased range of motion;Decreased activity tolerance;Decreased balance;Decreased mobility;Decreased knowledge of use of DME;Obesity       PT Treatment Interventions DME instruction;Gait training;Functional mobility training;Therapeutic activities;Therapeutic exercise;Balance training;Neuromuscular re-education;Patient/family education    PT Goals (Current goals can be found in the Care Plan section)  Acute Rehab PT Goals Patient Stated Goal: "return home" PT Goal Formulation: With patient Time For Goal Achievement: 11/09/20 Potential to Achieve Goals: Good    Frequency Min 3X/week   Barriers to discharge        Co-evaluation               AM-PAC PT "6 Clicks" Mobility  Outcome Measure Help needed turning from your back to your side while in a flat bed without using bedrails?: A Lot Help needed moving from lying on your back to sitting on the side of a flat bed without using bedrails?: A Lot Help needed moving to and from a bed to a chair (including a wheelchair)?: Total Help needed standing up from a chair using your arms (e.g., wheelchair or bedside chair)?: Total Help needed to walk in hospital room?: Total Help needed climbing 3-5 steps with a railing? : Total 6 Click Score: 8    End of Session Equipment Utilized During Treatment: Gait belt Activity Tolerance: Patient tolerated treatment well Patient left: in chair;with call bell/phone within reach Nurse Communication: Mobility status;Other (comment) (RLE wound draining) PT Visit Diagnosis: Unsteadiness on feet (R26.81);Other abnormalities of gait and mobility (R26.89);Muscle weakness (generalized) (M62.81)    Time: 2633-3545 PT Time Calculation (min) (ACUTE ONLY): 48 min   Charges:   PT Evaluation $PT Eval Moderate Complexity: 1 Mod PT Treatments $Therapeutic Activity: 8-22 mins          Tori Jovannie Ulibarri PT, DPT 10/26/20, 1:32 PM

## 2020-10-26 NOTE — Consult Note (Signed)
[facility] Nurse Consult Note: Reason for Consult:Bilateral LE wounds.  Unstageable Pressure In jury to right lateral malleolus, bilateral anterior LE with stasis dermatitis, L>R Wound type:Venous insufficiency, pressure, infection Pressure Injury POA: Yes Measurement: Bedside RN to measure pressure injury to right lateral malleolus and wound on left anterior LE (pretibial) prior to placement of first dressing change today and document on Nursing Flow Sheet (LxWxD in cm) Wound bed: Left LE with dried serum (partial thickness), right lateral malleolus with black eschar. See photos taken and uploaded to EMR on Admission. Drainage (amount, consistency, odor) Patient reported serous weeping bilaterally at home. With legs elevated this is less. Right lateral malleolus is dry. Periwound: Anterior LEs are with dried serum. Right lateral malleolus with circular peeling around Unstageable pressure injury Dressing procedure/placement/frequency:  Topical care guidance is provided for Nursing today for the LEs using collagenase to the right lateral malleolus Unstageable pressure injury and xeroform gauze to the anterior left pretibial areas.  Both legs are to be washed with soap and water daily, rinsed and moisturized using Eucerin cream once daily prior to wound care. Dressings will be secured using Kerlix roll gauze topped with 6-inch ACE bandages for mild compression. Pressure redistribution heel boots will be used for pressure injury prevention, correction of lateral rotation and prevention of crossing of legs. A sacral foam dressing and a pressure redistribution chair cushion are provided for pressure injury prevention.  Recommendations today are a lower extremity workup including ABIs, xray of the right lateral malleolus to determine if there is infection or bony involvement and referral to an outpatient wound care center of his choosing for follow care of his lower extremities up to an including compression hosiery  or a removable compression device such as CircAid. If unable to manage care and transportation at home, may require a short stay at a rehab facility for strength recovery and wound care.  [facility] nursing team will not follow, but will remain available to this patient, the nursing and medical teams.  Please re-consult if needed. Thanks, Maudie Flakes, MSN, RN, [place], Arther Abbott  Pager# [phone]

## 2020-10-26 NOTE — Consult Note (Signed)
[facility] Consult  Reason for Consult: Right ankle lateral malleolus wound  Referring Physician: Dr. Broady Shelter   Chief Complaint    Leg Swelling      HPI: Danny [name] is a 65 y.o. male with HTN, DM, CKD, CHF, Anemia and a a chronic non healing right lateral ankle wound that he comes into the hospital for wrosening leg swelling and weakness. He has known venous stasis changes to his legs and some history of ulceration. He has worn compression socks in the past. He says he has had a wound on the right ankle for going on 7+ years. He does report it has been healed at one point. He says he has not walked in over a year and has been putting [employer] on the ulcer. He has had prior wound care at the [facility] in [place] and some wound care with PT on [address] in the past.     Past Medical History:  Diagnosis Date  . Anemia   . Arthritis   . CKD (chronic kidney disease), stage IV ([place])   . Diabetes mellitus without complication ([place])   . Foot ulcer ([place])   . Hypertension   . Urinary retention     Past Surgical History:  Procedure Laterality Date  . ANKLE SURGERY Right   . CHOLECYSTECTOMY    . FOOT SURGERY Right   . TRANSURETHRAL RESECTION OF PROSTATE N/A 01/15/2020   Procedure: TRANSURETHRAL RESECTION OF THE PROSTATE (TURP)  with General anesthesia and spinal;  Surgeon: Cleon Gustin, MD;  Location: [facility];  Service: Urology;  Laterality: N/A;    Family History  Problem Relation Age of Onset  . Diabetes Mother   . Heart attack Mother   . Heart attack Father   . Diabetes Brother     Social History   Tobacco Use  . Smoking status: Never Smoker  . Smokeless tobacco: Never Used  Vaping Use  . Vaping Use: Never used  Substance Use Topics  . Alcohol use: No  . Drug use: No    Medications:  I have reviewed the patient's current medications. Prior to Admission:  Medications Prior to Admission  Medication Sig Dispense Refill Last Dose  .  acetaminophen (TYLENOL) 500 MG tablet Take 1,000 mg by mouth every 6 (six) hours as needed for moderate pain or headache.     Marland Kitchen amLODipine (NORVASC) 10 MG tablet Take 10 mg by mouth daily.    10/25/2020 at Unknown time  . Ascorbic Acid (VITAMIN C) 500 MG CHEW Chew 1,000 mg by mouth daily.   10/25/2020 at Unknown time  . aspirin 325 MG tablet Take 325 mg by mouth daily.   10/25/2020 at Unknown time  . cholecalciferol (VITAMIN D3) 25 MCG (1000 UNIT) tablet Take 1,000 Units by mouth daily.   10/25/2020 at Unknown time  . epoetin alfa-epbx (RETACRIT) 67341 UNIT/ML injection 8,000 Units every 14 (fourteen) days.     . ferrous sulfate 325 (65 FE) MG tablet Take 325 mg by mouth 2 (two) times daily with a meal.   Past Month at Unknown time  . hydrALAZINE (APRESOLINE) 100 MG tablet Take 100 mg by mouth 2 (two) times daily.   10/25/2020 at Unknown time  . Magnesium 100 MG CAPS Take 100 mg by mouth daily.    10/25/2020 at Unknown time  . metoprolol succinate (TOPROL-XL) 100 MG 24 hr tablet Take 1 tablet (100 mg total) by mouth every evening. 30 tablet 0 Past Week at  Unknown time  . Multiple Vitamin (MULTIVITAMIN WITH MINERALS) TABS tablet Take 1 tablet by mouth daily.   10/25/2020 at Unknown time  . NOVOLIN 70/30 RELION (70-30) 100 UNIT/ML injection Inject 20-100 Units into the skin in the morning and at bedtime.    10/25/2020 at Unknown time  . Omega-3 Fatty Acids (FISH OIL PO) Take 2,400 mg by mouth 2 (two) times daily.   10/25/2020 at Unknown time  . pravastatin (PRAVACHOL) 80 MG tablet Take 1 tablet (80 mg total) by mouth every evening. 30 tablet 0 Past Week at Unknown time  . torsemide (DEMADEX) 20 MG tablet Take 2 tablets by mouth daily; okay to use extra 20 mg daily if patient gain more than 3 pounds overnight or more than 5 pounds in a week.   10/25/2020 at Unknown time  . vitamin B-12 (CYANOCOBALAMIN) 1000 MCG tablet Take 1,000 mcg by mouth daily.   10/25/2020 at Unknown time  . vitamin E 1000 UNIT capsule Take 1,000 Units  by mouth daily.   10/25/2020 at Unknown time  . Zinc 30 MG CAPS Take 1 capsule by mouth daily.   10/25/2020 at Unknown time  . doxycycline (VIBRAMYCIN) 100 MG capsule Take 1 capsule (100 mg total) by mouth 2 (two) times daily. (Patient not taking: No sig reported) 20 capsule 0 Completed Course at Unknown time  . ketotifen (ALLERGY EYE DROPS) 0.025 % ophthalmic solution Place 1 drop into both eyes daily as needed (allergies/dry eyes). (Patient not taking: Reported on 10/26/2020)   Not Taking at Unknown time   Scheduled: . amLODipine  10 mg Oral Daily  . aspirin  325 mg Oral Daily  . cholecalciferol  1,000 Units Oral Daily  . collagenase   Topical Daily  . epoetin alfa-epbx  8,000 Units Subcutaneous Q14 Days  . feeding supplement (GLUCERNA SHAKE)  237 mL Oral TID BM  . heparin  5,000 Units Subcutaneous Q8H  . hydrALAZINE  100 mg Oral TID  . hydrocerin   Topical Daily  . insulin aspart  0-15 Units Subcutaneous TID WC  . insulin aspart  0-5 Units Subcutaneous QHS  . insulin detemir  20 Units Subcutaneous QHS  . linezolid  600 mg Oral Q12H  . metoprolol succinate  100 mg Oral QPM  . nystatin   Topical TID  . pravastatin  80 mg Oral QPM  . [START ON 10/27/2020] torsemide  20 mg Oral Daily  . vitamin C  1,000 mg Oral Daily   Continuous: . sodium chloride 50 mL/hr at 10/26/20 1153   UDJ:SHFWYOVZCHYIF **OR** acetaminophen, ondansetron **OR** ondansetron (ZOFRAN) IV, oxyCODONE  Allergies  Allergen Reactions  . Dust Mite Extract Itching and Other (See Comments)    Unknown reaction-potential shortness of breath  . Prednisone Nausea And Vomiting  . Rocephin [Ceftriaxone] Nausea And Vomiting     ROS:  A comprehensive review of systems was negative except for: Constitutional: positive for fatigue and weakness Musculoskeletal: positive for swelling in legs, chronic venous stasis changes Neurological: positive for neuropathy peripherally  Blood pressure (!) 148/85, pulse 84, temperature (!) 97.5  F (36.4 C), temperature source Oral, resp. rate 17, height 6\' 1"  (1.854 m), weight 126.5 kg, SpO2 95 %. Physical Exam Vitals reviewed.  Constitutional:      Appearance: He is obese.  HENT:     Head: Normocephalic.     Nose: Nose normal.  Eyes:     Extraocular Movements: Extraocular movements intact.  Cardiovascular:     Rate and Rhythm: Normal  rate.  Pulmonary:     Effort: Pulmonary effort is normal.  Abdominal:     General: There is no distension.     Palpations: Abdomen is soft.     Tenderness: There is no abdominal tenderness.  Musculoskeletal:        General: Swelling present. Normal range of motion.     Right lower leg: Edema present.     Left lower leg: Edema present.     Comments: Venous stasis disease and skin blistering, flaking bilateral lower extremities, right lateral malleolus ulcer with chronic changes, firm discolored base, verrucous like patchy tissue; foot almost looks club like in appearance and is rolled lateral  Skin:    General: Skin is warm.  Neurological:     General: No focal deficit present.     Mental Status: He is alert and oriented to person, place, and time.  Psychiatric:        Mood and Affect: Mood normal.        Behavior: Behavior normal.        Thought Content: Thought content normal.        Judgment: Judgment normal.      After biopsy/ tissue culture-      Results: Results for orders placed or performed during the hospital encounter of 10/25/20 (from the past 48 hour(s))  Comprehensive metabolic panel     Status: Abnormal   Collection Time: 10/25/20  1:27 PM  Result Value Ref Range   Sodium 137 135 - 145 mmol/L   Potassium 4.2 3.5 - 5.1 mmol/L   Chloride 107 98 - 111 mmol/L   CO2 22 22 - 32 mmol/L   Glucose, Bld 151 (H) 70 - 99 mg/dL    Comment: Glucose reference range applies only to samples taken after fasting for at least 8 hours.   BUN 47 (H) 8 - 23 mg/dL   Creatinine, Ser 3.79 (H) 0.61 - 1.24 mg/dL   Calcium 8.6 (L) 8.9 -  10.3 mg/dL   Total Protein 7.2 6.5 - 8.1 g/dL   Albumin 2.8 (L) 3.5 - 5.0 g/dL   AST 23 15 - 41 U/L   ALT 26 0 - 44 U/L   Alkaline Phosphatase 131 (H) 38 - 126 U/L   Total Bilirubin 0.7 0.3 - 1.2 mg/dL   GFR, Estimated 17 (L) >60 mL/min    Comment: (NOTE) Calculated using the CKD-EPI Creatinine Equation (2021)    Anion gap 8 5 - 15    Comment: Performed at [facility], [address], [place], [place] [address]  CBC with Differential     Status: Abnormal   Collection Time: 10/25/20  1:27 PM  Result Value Ref Range   WBC 7.4 4.0 - 10.5 K/uL   RBC 3.29 (L) 4.22 - 5.81 MIL/uL   Hemoglobin 8.9 (L) 13.0 - 17.0 g/dL   HCT 28.0 (L) 39.0 - 52.0 %   MCV 85.1 80.0 - 100.0 fL   MCH 27.1 26.0 - 34.0 pg   MCHC 31.8 30.0 - 36.0 g/dL   RDW 16.9 (H) 11.5 - 15.5 %   Platelets 188 150 - 400 K/uL   nRBC 0.0 0.0 - 0.2 %   Neutrophils Relative % 80 %   Neutro Abs 6.0 1.7 - 7.7 K/uL   Lymphocytes Relative 8 %   Lymphs Abs 0.6 (L) 0.7 - 4.0 K/uL   Monocytes Relative 7 %   Monocytes Absolute 0.5 0.1 - 1.0 K/uL   Eosinophils Relative 4 %  Eosinophils Absolute 0.3 0.0 - 0.5 K/uL   Basophils Relative 1 %   Basophils Absolute 0.0 0.0 - 0.1 K/uL   Immature Granulocytes 0 %   Abs Immature Granulocytes 0.02 0.00 - 0.07 K/uL    Comment: Performed at [facility], [address] [place], [place] [address]  Lactic acid, plasma     Status: None   Collection Time: 10/25/20  1:27 PM  Result Value Ref Range   Lactic Acid, Venous 0.9 0.5 - 1.9 mmol/L    Comment: Performed at [facility], [address], [place], [place] [address]  Brain natriuretic peptide     Status: Abnormal   Collection Time: 10/25/20  1:27 PM  Result Value Ref Range   B Natriuretic Peptide 351.0 (H) 0.0 - 100.0 pg/mL    Comment: Performed at [facility], [address] [place], [place] [address]  Iron and TIBC     Status: Abnormal   Collection Time: 10/25/20  1:27 PM  Result Value Ref Range   Iron 30 (L) 45 - 182 ug/dL   TIBC 263  250 - 450 ug/dL   Saturation Ratios 11 (L) 17.9 - 39.5 %   UIBC 233 ug/dL    Comment: Performed at [facility], [address] [place], [place] [address]  Urinalysis, Routine w reflex microscopic Urine, Clean Catch     Status: Abnormal   Collection Time: 10/25/20  3:16 PM  Result Value Ref Range   Color, Urine YELLOW YELLOW   APPearance CLEAR CLEAR   Specific Gravity, Urine 1.010 1.005 - 1.030   pH 6.0 5.0 - 8.0   Glucose, UA 150 (A) NEGATIVE mg/dL   Hgb urine dipstick SMALL (A) NEGATIVE   Bilirubin Urine NEGATIVE NEGATIVE   Ketones, ur NEGATIVE NEGATIVE mg/dL   Protein, ur >=300 (A) NEGATIVE mg/dL   Nitrite NEGATIVE NEGATIVE   Leukocytes,Ua NEGATIVE NEGATIVE   RBC / HPF 6-10 0 - 5 RBC/hpf   WBC, UA 0-5 0 - 5 WBC/hpf   Bacteria, UA NONE SEEN NONE SEEN   Squamous Epithelial / LPF 0-5 0 - 5   Mucus PRESENT     Comment: Performed at [facility], [address], [place], [place] [address]  Lactic acid, plasma     Status: None   Collection Time: 10/25/20  3:19 PM  Result Value Ref Range   Lactic Acid, Venous 0.8 0.5 - 1.9 mmol/L    Comment: Performed at [facility], [address], [place], [place] [address]  POC CBG, ED     Status: Abnormal   Collection Time: 10/25/20  3:20 PM  Result Value Ref Range   Glucose-Capillary 148 (H) 70 - 99 mg/dL    Comment: Glucose reference range applies only to samples taken after fasting for at least 8 hours.  Aerobic Culture w Gram Stain (superficial specimen)     Status: None (Preliminary result)   Collection Time: 10/25/20  3:29 PM   Specimen: Skin, Cyst/Tag/Debridement; Wound  Result Value Ref Range   Specimen Description      SKIN Performed at [facility], [place] [address] [place], [place] [address]    Special Requests      NONE Performed at [facility], [address] [place], [place] [address]    Gram Stain      RARE WBC PRESENT, PREDOMINANTLY PMN RARE GRAM POSITIVE COCCI IN PAIRS RARE GRAM NEGATIVE RODS    Culture       CULTURE REINCUBATED FOR BETTER GROWTH Performed  at [facility], [place] [address], [place], [place] [address]    Report Status PENDING   Culture, blood (routine x 2)     Status: None (Preliminary result)   Collection Time: 10/25/20  3:59 PM   Specimen: BLOOD  Result Value Ref Range   Specimen Description BLOOD LEFT ARM    Special Requests      BOTTLES DRAWN AEROBIC AND ANAEROBIC Blood Culture adequate volume   Culture      NO GROWTH < 24 HOURS Performed at [facility], [address] [name], [place] [address]    Report Status PENDING   Culture, blood (routine x 2)     Status: None (Preliminary result)   Collection Time: 10/25/20  3:59 PM   Specimen: BLOOD  Result Value Ref Range   Specimen Description BLOOD RIGHT ARM    Special Requests      BOTTLES DRAWN AEROBIC AND ANAEROBIC Blood Culture results may not be optimal due to an excessive volume of blood received in culture bottles   Culture      NO GROWTH < 24 HOURS Performed at [facility], [address], [place], [place] [address]    Report Status PENDING   SARS CORONAVIRUS 2 (TAT 6-24 HRS) Nasopharyngeal Nasopharyngeal Swab     Status: None   Collection Time: 10/25/20  4:30 PM   Specimen: Nasopharyngeal Swab  Result Value Ref Range   SARS Coronavirus 2 NEGATIVE NEGATIVE    Comment: (NOTE) SARS-CoV-2 target nucleic acids are NOT DETECTED.  The SARS-CoV-2 RNA is generally detectable in upper and lower respiratory specimens during the acute phase of infection. Negative results do not preclude SARS-CoV-2 infection, do not rule out co-infections with other pathogens, and should not be used as the sole basis for treatment or other patient management decisions. Negative results must be combined with clinical observations, patient history, and epidemiological information. The expected result is Negative.  Fact Sheet for Patients: SugarRoll.be  Fact Sheet for Healthcare  Providers: https://www.woods-mathews.com/  This test is not yet approved or cleared by the [place] FDA and  has been authorized for detection and/or diagnosis of SARS-CoV-2 by FDA under an Emergency Use Authorization (EUA). This EUA will remain  in effect (meaning this test can be used) for the duration of the COVID-19 declaration under Se ction 564(b)(1) of the Act, 21 U.S.C. section 360bbb-3(b)(1), unless the authorization is terminated or revoked sooner.  Performed at [facility], [place] [address] [place], [place] [address]   Aerobic Culture w Gram Stain (superficial specimen)     Status: None (Preliminary result)   Collection Time: 10/25/20  5:10 PM   Specimen: Ankle; Wound  Result Value Ref Range   Specimen Description      ANKLE Performed at [facility], [address], [place], [place] [address]    Special Requests      NONE Performed at [facility], [address] [place], [place] [address]    Gram Stain      RARE WBC PRESENT, PREDOMINANTLY PMN MODERATE GRAM POSITIVE COCCI IN PAIRS IN CLUSTERS MODERATE GRAM NEGATIVE RODS RARE GRAM POSITIVE RODS    Culture      CULTURE REINCUBATED FOR BETTER GROWTH Performed at [facility], [place] [address], [place], [place] [address]    Report Status PENDING   Sedimentation rate     Status: Abnormal   Collection Time: 10/25/20  9:28 PM  Result Value Ref Range   Sed Rate 69 (  H) 0 - 16 mm/hr    Comment: Performed at [facility], [address], [place], [place] [address]  C-reactive protein     Status: Abnormal   Collection Time: 10/25/20  9:28 PM  Result Value Ref Range   CRP 3.4 (H) <1.0 mg/dL    Comment: Performed at [facility], [address] [place], [place] [address]  Glucose, capillary     Status: Abnormal   Collection Time: 10/26/20  1:27 AM  Result Value Ref Range   Glucose-Capillary 118 (H) 70 - 99 mg/dL    Comment: Glucose reference range applies only to samples taken after fasting for  at least 8 hours.  Comprehensive metabolic panel     Status: Abnormal   Collection Time: 10/26/20  5:30 AM  Result Value Ref Range   Sodium 138 135 - 145 mmol/L   Potassium 4.1 3.5 - 5.1 mmol/L   Chloride 108 98 - 111 mmol/L   CO2 23 22 - 32 mmol/L   Glucose, Bld 124 (H) 70 - 99 mg/dL    Comment: Glucose reference range applies only to samples taken after fasting for at least 8 hours.   BUN 47 (H) 8 - 23 mg/dL   Creatinine, Ser 3.80 (H) 0.61 - 1.24 mg/dL   Calcium 8.6 (L) 8.9 - 10.3 mg/dL   Total Protein 6.8 6.5 - 8.1 g/dL   Albumin 2.6 (L) 3.5 - 5.0 g/dL   AST 20 15 - 41 U/L   ALT 22 0 - 44 U/L   Alkaline Phosphatase 124 38 - 126 U/L   Total Bilirubin 0.9 0.3 - 1.2 mg/dL   GFR, Estimated 17 (L) >60 mL/min    Comment: (NOTE) Calculated using the CKD-EPI Creatinine Equation (2021)    Anion gap 7 5 - 15    Comment: Performed at [facility], [address] [place], [place] [address]  Magnesium     Status: None   Collection Time: 10/26/20  5:30 AM  Result Value Ref Range   Magnesium 2.0 1.7 - 2.4 mg/dL    Comment: Performed at [facility], [address], [place], [place] [address]  CBC     Status: Abnormal   Collection Time: 10/26/20  5:30 AM  Result Value Ref Range   WBC 7.2 4.0 - 10.5 K/uL   RBC 3.14 (L) 4.22 - 5.81 MIL/uL   Hemoglobin 8.6 (L) 13.0 - 17.0 g/dL   HCT 27.0 (L) 39.0 - 52.0 %   MCV 86.0 80.0 - 100.0 fL   MCH 27.4 26.0 - 34.0 pg   MCHC 31.9 30.0 - 36.0 g/dL   RDW 16.9 (H) 11.5 - 15.5 %   Platelets 171 150 - 400 K/uL   nRBC 0.0 0.0 - 0.2 %    Comment: Performed at [facility], [address], [place], [place] [address]  Hemoglobin A1c     Status: Abnormal   Collection Time: 10/26/20  5:30 AM  Result Value Ref Range   Hgb A1c MFr Bld 7.6 (H) 4.8 - 5.6 %    Comment: (NOTE) Pre diabetes:          5.7%-6.4%  Diabetes:              >6.4%  Glycemic control for   <7.0% adults with diabetes    Mean Plasma Glucose 171.42 mg/dL    Comment: Performed at [facility], [place] [address] [place], [place] [address]  Glucose, capillary     Status: Abnormal  Collection Time: 10/26/20  7:42 AM  Result Value Ref Range   Glucose-Capillary 130 (H) 70 - 99 mg/dL    Comment: Glucose reference range applies only to samples taken after fasting for at least 8 hours.  Glucose, capillary     Status: Abnormal   Collection Time: 10/26/20 11:57 AM  Result Value Ref Range   Glucose-Capillary 146 (H) 70 - 99 mg/dL    Comment: Glucose reference range applies only to samples taken after fasting for at least 8 hours.  Glucose, capillary     Status: Abnormal   Collection Time: 10/26/20  4:13 PM  Result Value Ref Range   Glucose-Capillary 148 (H) 70 - 99 mg/dL    Comment: Glucose reference range applies only to samples taken after fasting for at least 8 hours.   Comment 1 Notify RN    Comment 2 Document in Chart     DG Chest 2 View  Result Date: 10/25/2020 CLINICAL DATA:  CHF with leg swelling and ulcer to right lateral malleolus. EXAM: CHEST - 2 VIEW COMPARISON:  09/26/20 FINDINGS: There is mild cardiac enlargement, stable from previous exam. No pleural effusion or interstitial edema identified. No airspace densities. IMPRESSION: No acute findings.  No evidence for congestive heart failure. Electronically Signed   By: Kerby Moors M.D.   On: 10/25/2020 14:21   DG Ankle Complete Right  Result Date: 10/25/2020 CLINICAL DATA:  CHF, lower extremity swelling, lateral malleolar ulcer with concern for infection, no reported injury EXAM: RIGHT ANKLE - COMPLETE 3+ VIEW COMPARISON:  09/26/2020 right ankle radiographs FINDINGS: Diffuse right ankle soft tissue swelling. Small soft tissue defect at the lateral malleolus compatible with reported ulcer. Subtle loss of cortical line at the lateral distal margin of the lateral malleolus suspicious for early bony erosion. No fracture. No subluxation. Severe tibiotalar osteoarthritis. Intact staples at the lateral aspect of the  calcaneus. Partially visualized intact screw at the base of fifth metatarsal. No focal osseous lesions. IMPRESSION: Diffuse right ankle soft tissue swelling. Small soft tissue defect at the lateral malleolus compatible with reported ulcer. Subtle loss of cortical line at the lateral distal margin of the lateral malleolus, suspicious for early bony erosion due to osteomyelitis. Consider MRI of the right ankle without IV contrast for further evaluation as clinically warranted. Electronically Signed   By: Ilona Sorrel M.D.   On: 10/25/2020 14:24   MR ANKLE RIGHT WO CONTRAST  Result Date: 10/26/2020 CLINICAL DATA:  Lateral right ankle ulceration EXAM: MRI OF THE RIGHT ANKLE WITHOUT CONTRAST TECHNIQUE: Multiplanar, multisequence MR imaging of the ankle was performed. No intravenous contrast was administered. COMPARISON:  X-ray 10/25/2020 FINDINGS: TENDONS Peroneal: Tendinosis with longitudinal split tear of the infra-malleolar aspect of the peroneus brevis tendon. Intact peroneus longus tendon. No tenosynovitis. Posteromedial: Intact tibialis posterior, flexor hallucis longus and flexor digitorum longus tendons. Anterior: Intact tibialis anterior, extensor hallucis longus and extensor digitorum longus tendons. Achilles: Intact. Plantar Fascia: Intact. LIGAMENTS Lateral: Anterior talofibular ligament and calcaneofibular ligaments are not clearly seen, and likely chronically torn. Posterior talofibular ligament is heterogeneous but intact. Intact anterior and posterior tibiofibular ligaments. Medial: Deltoid ligament appears chronically torn. Portions of the spring ligament complex are ill-defined. CARTILAGE Ankle Joint: Severe tibiotalar osteoarthritis with full-thickness cartilage loss most pronounced anteriorly. No joint effusion. Prominent osseous densities along the posterior aspect of the tibiotalar and subtalar joints, largest measuring up to 2.4 cm. Subtalar Joints/Sinus Tarsi: Moderate arthropathy of the  posterior subtalar joint. No effusion. Preservation of the  anatomic fat within the sinus tarsi. Bones: No acute fracture or dislocation. No bony erosion or cortical destruction. No marrow edema. Well healed calcaneal osteotomy. Metallic susceptibility artifact from hardware within the fifth metatarsal. Other: Soft tissue swelling with superficial ulceration overlying the lateral malleolus. No organized fluid collection. Diffuse edema-like signal of the visualized foot and lower leg musculature. There is also fatty atrophy of the visualized musculature compatible with chronic denervation. IMPRESSION: 1. Soft tissue swelling with superficial ulceration overlying the lateral malleolus. No organized fluid collection. No evidence of osteomyelitis. 2. Severe tibiotalar osteoarthritis with prominent osseous densities along the posterior aspect of the tibiotalar and subtalar joints, largest measuring up to 2.4 cm. 3. Fatty atrophy and edema throughout the visualized foot and lower leg musculature, likely representing a combination of denervation changes and/or myositis. Findings are in agreement with the preliminary report provided by Dr. Francoise Ceo. Preliminary report was called to the ordering provider Eustaquio Maize, PA at 5:54 p.m. on 10/25/2020 by Dr. Francoise Ceo. Electronically Signed   By: Davina Poke D.O.   On: 10/26/2020 08:32   Diagnosis: Non healing right lateral ankle wound  Procedure: Excisional punch biopsy of nonhealing wound  Description: The right ankle was prepped with betadine. Using a punch biopsy 74mm disposable tool, multiple areas of tissue were excised attempting to get full thickness biopsies in a variety of locations on the wound including the necrotic edge. The biopsies were sent for culture of the tissue and for surgical pathology evaluation.  Hemostasis was achieved with pressure and silver nitrate. The RNs then placed the dressing per the Wound RN.   Assessment & Plan:  Cohen Boettner is a  65 y.o. male with a nonhealing right lateral ankle wound with a very odd appearance. This wound could look like this from maceration and the salve but also could be related to fungus or even a squamous cell cancer from the chronicity.  We discussed biopsy and tissue culture. We discussed the risk of bleeding, infection, and finding something like cancer.  His foot is misshapen and deformed. He has not walked in some time.  We discussed that he may ultimately need an amputation given the wound and his foot deformity. He does not see super interested in this at this time.   - Biopsies sent to pathology and micro - Agree with ABI to assess ability to heal wound and an intervention  - Wound care per the Wound RN instructions   All questions were answered to the satisfaction of the patient.    Virl Cagey 10/26/2020, 5:34 PM

## 2020-10-26 NOTE — Progress Notes (Signed)
Dr. Constance Haw at bedside to evaluate leg wounds and to do biopsy of wound on right lateral ankle.

## 2020-10-26 NOTE — Progress Notes (Signed)
PROGRESS NOTE    Patient: Danny [name]                            PCP: Celene Squibb, MD                    DOB: 1956/01/02            DOA: 10/25/2020 FBP:[id number]             DOS: 10/26/2020, 11:48 AM   LOS: 0 days   Date of Service: The patient was seen and examined on 10/26/2020  Subjective:   The patient was seen and examined this morning. Stable at this time. Still complaining of : Lower extremity erythema edema Otherwise no issues overnight .  Brief Narrative:    Danny [name]  is a 65 y.o. male, with history of hypertension, diabetes mellitus type 2, CKD, anemia, and ...  ED with a chief complaint of leg swelling and weakness.  Patient reports that he has had venous stasis changes of his legs for years.  Edema erythema right lateral malleolus open wound and drainage... Treated with doxycycline in April with no improvement.  Also complaining of significant weakness in his legs unable to ambulate.  h/o prostate procedure in July 2021.   In the ED Temp 97.5, heart rate 81, respiratory rate 16-18, blood pressure 132/76, satting at 97% No leukocytosis with white blood cell count of 7.4, hemoglobin 8.9 Chemistry panel reveals an elevated BUN and creatinine  47 and 3.79 Glucose is 151 UA is not indicative of UTI,  CRP is 3.4, sed rate is 69 COVID negative  Blood cultures pending Official read of the MR ankle pending-ED provider reports the preliminary read was no concern for osteomyelitis EKG shows a heart rate of 97, QTc 477, PVCs Chest x-ray is negative Alk phos is elevated 131     Assessment & Plan:   Principal Problem:   Cellulitis Active Problems:   Controlled type 2 diabetes mellitus with stage 3 chronic kidney disease, with long-term current use of insulin (HCC)   Moderate protein-calorie malnutrition (HCC)   Medical non-compliance    1. Bilateral lower extremity cellulitis -ankle open wound, chronic venous stasis  1. Likely a component of cellulitis and a  component of venous stasis contributing to the open wounds on patient's leg 2. Wound care consulted, also general surgery consulted for evaluation possible debridement 3. Patient was initiated on vancomycin, due to acute on chronic kidney disease, switch to linezolid today..  Reviewed no signs of osteomyelitis 4. Came to the ER earlier in April and was started on doxycycline, reports taking all of it but then having no way to follow-up with any provider secondary to transportation barriers, now legs worsening 5. Vancomycin started in the ED... Discontinued 10/26/2020 6. Blood cultures >>>> 7. MRI ankle official result pending, ED provider reports that the radiologist gave her preliminary report of no osteomyelitis suspected 8. CRP 3.4, ESR 69 9. Continue to monitor  2. Fall and generalized weakness 1. Mechanical fall no LOC, did not hit head 2. Consult PT 3. We will entertain possible inpatient rehab once stable  3. DMII -uncontrolled 1. CBG QA CHS, SSI coverage, 2. Takes 70/30 20-100 units at home 3. Continue Lantus 20 units 4. A1c: 7.6 5. Carb modified diet 4. Moderate protein cal malnutrition 1. Albumin 2.8 2. Counseled on the importance of nutrient dense diet 3. Glucerna shakes 4. Continue to  monitor 5. Medical non compliance 1. TOC consulted for possible PT at home?  Any assistance with transportation for medical appointments? 6.  Acute on chronic kidney disease stage IIIb -Trend BUN/creatinine closely, -Avoid nephrotoxin, will discontinue vancomycin -BUN/creatinine mildly elevated from baseline BUN 47 creatinine 3.8 (baseline creatinine 3.6)         ----------------------------------------------------------------------------------------------------------------------------------------------- Nutritional status:  The patient's BMI is: Body mass index is 36.79 kg/m. I agree with the assessment and plan as outlined  Skin Assessment: I have examined the patient's skin  and I agree with the wound assessment as performed by wound care team As outlined belowe: Pressure Injury 03/24/20 Sacrum Medial Stage 2 -  Partial thickness loss of dermis presenting as a shallow open injury with a red, pink wound bed without slough. (Active)  03/24/20 2200  Location: Sacrum  Location Orientation: Medial  Staging: Stage 2 -  Partial thickness loss of dermis presenting as a shallow open injury with a red, pink wound bed without slough.  Wound Description (Comments):   Present on Admission: Yes     ---------------------------------------------------------------------------------------------------------------------------------------------------- Cultures; Blood Cultures x 2 >> NGT   Antimicrobials: 5-22 vancomycin IV >> till 10/26/2020 10/26/2020 IV linezolid   Consultants: Wound care, general surgery   ------------------------------------------------------------------------------------------------------------------------------------------------  DVT prophylaxis:  SCD/Compression stockings and Heparin SQ Code Status:   Code Status: Full Code  Family Communication: No family member present at bedside- attempt will be made to update daily The above findings and plan of care has been discussed with patient (and family)  in detail,  they expressed understanding and agreement of above. -Advance care planning has been discussed.   Admission status:   Status is: Observation  The patient remains OBS appropriate and will d/c before 2 midnights.  Dispo: The patient is from: Home              Anticipated d/c is to: Home with HH in 2-3 days               Patient currently is not medically stable to d/c.   Difficult to place patient No      Level of care: Med-Surg   Procedures:   No admission procedures for hospital encounter.    Antimicrobials:  Anti-infectives (From admission, onward)   Start     Dose/Rate Route Frequency Ordered Stop   10/26/20 1200   vancomycin (VANCOREADY) IVPB 750 mg/150 mL  Status:  Discontinued        750 mg 150 mL/hr over 60 Minutes Intravenous Every 24 hours 10/25/20 2351 10/26/20 0948   10/26/20 1100  linezolid (ZYVOX) tablet 600 mg        600 mg Oral Every 12 hours 10/26/20 1009     10/25/20 1530  vancomycin (VANCOCIN) IVPB 1000 mg/200 mL premix        1,000 mg 200 mL/hr over 60 Minutes Intravenous  Once 10/25/20 1527 10/25/20 1736       Medication:  . amLODipine  10 mg Oral Daily  . aspirin  325 mg Oral Daily  . cholecalciferol  1,000 Units Oral Daily  . collagenase   Topical Daily  . [START ON 11/09/2020] epoetin alfa-epbx  8,000 Units Subcutaneous Q14 Days  . feeding supplement (GLUCERNA SHAKE)  237 mL Oral TID BM  . heparin  5,000 Units Subcutaneous Q8H  . hydrALAZINE  100 mg Oral TID  . hydrocerin   Topical Daily  . insulin aspart  0-15 Units Subcutaneous TID WC  . insulin aspart  0-5  Units Subcutaneous QHS  . insulin detemir  20 Units Subcutaneous QHS  . linezolid  600 mg Oral Q12H  . metoprolol succinate  100 mg Oral QPM  . nystatin   Topical TID  . pravastatin  80 mg Oral QPM  . [START ON 10/27/2020] torsemide  20 mg Oral Daily  . vitamin C  1,000 mg Oral Daily    acetaminophen **OR** acetaminophen, ondansetron **OR** ondansetron (ZOFRAN) IV, oxyCODONE   Objective:   Vitals:   10/26/20 0343 10/26/20 0829 10/26/20 0900 10/26/20 1143  BP: (!) 166/88 (!) 153/86 (!) 170/94 (!) 147/86  Pulse: (!) 108  (!) 109 78  Resp: 20  18 20   Temp: 97.6 F (36.4 C)  98 F (36.7 C) 98.2 F (36.8 C)  TempSrc: Oral  Oral Oral  SpO2: 96% 96% 95% 97%  Weight:      Height:        Intake/Output Summary (Last 24 hours) at 10/26/2020 1148 Last data filed at 10/26/2020 0900 Gross per 24 hour  Intake 240 ml  Output 700 ml  Net -460 ml   Filed Weights   10/25/20 1211  Weight: 126.5 kg     Examination:   Physical Exam  Constitution:  Alert, cooperative, no distress,  Appears calm and comfortable   Psychiatric: Normal and stable mood and affect, cognition intact,   HEENT: Normocephalic, PERRL, otherwise with in Normal limits  Chest:Chest symmetric Cardio vascular:  S1/S2, RRR, No murmure, No Rubs or Gallops  pulmonary: Clear to auscultation bilaterally, respirations unlabored, negative wheezes / crackles Abdomen: Soft, non-tender, non-distended, bowel sounds,no masses, no organomegaly Muscular skeletal: Limited exam - in bed, able to move all 4 extremities, Normal strength,  Neuro: CNII-XII intact. , normal motor and sensation, reflexes intact  Extremities: No pitting edema lower extremities, +2 pulses  Skin: Dry, warm to touch, negative for any Rashes, No open wounds Wounds: per nursing documentation Pressure Injury 03/24/20 Sacrum Medial Stage 2 -  Partial thickness loss of dermis presenting as a shallow open injury with a red, pink wound bed without slough. (Active)  03/24/20 2200  Location: Sacrum  Location Orientation: Medial  Staging: Stage 2 -  Partial thickness loss of dermis presenting as a shallow open injury with a red, pink wound bed without slough.  Wound Description (Comments):   Present on Admission: Yes    ------------------------------------------------------------------------------------------------------------------------------------------    LABs:  CBC Latest Ref Rng & Units 10/26/2020 10/25/2020 09/26/2020  WBC 4.0 - 10.5 K/uL 7.2 7.4 7.8  Hemoglobin 13.0 - 17.0 g/dL 8.6(L) 8.9(L) 8.7(L)  Hematocrit 39.0 - 52.0 % 27.0(L) 28.0(L) 27.3(L)  Platelets 150 - 400 K/uL 171 188 190   CMP Latest Ref Rng & Units 10/26/2020 10/25/2020 09/26/2020  Glucose 70 - 99 mg/dL 124(H) 151(H) 181(H)  BUN 8 - 23 mg/dL 47(H) 47(H) 46(H)  Creatinine 0.61 - 1.24 mg/dL 3.80(H) 3.79(H) 3.96(H)  Sodium 135 - 145 mmol/L 138 137 138  Potassium 3.5 - 5.1 mmol/L 4.1 4.2 4.5  Chloride 98 - 111 mmol/L 108 107 110  CO2 22 - 32 mmol/L 23 22 19(L)  Calcium 8.9 - 10.3 mg/dL 8.6(L) 8.6(L) 8.6(L)   Total Protein 6.5 - 8.1 g/dL 6.8 7.2 7.3  Total Bilirubin 0.3 - 1.2 mg/dL 0.9 0.7 0.7  Alkaline Phos 38 - 126 U/L 124 131(H) 109  AST 15 - 41 U/L 20 23 26   ALT 0 - 44 U/L 22 26 39       Micro Results Recent Results (from  the past 240 hour(s))  Aerobic Culture w Gram Stain (superficial specimen)     Status: None (Preliminary result)   Collection Time: 10/25/20  3:29 PM   Specimen: Skin, Cyst/Tag/Debridement; Wound  Result Value Ref Range Status   Specimen Description   Final    SKIN Performed at [facility] [address] [place], [place] [address]    Special Requests   Final    NONE Performed at [facility], [address], [place], [place] [address]    Gram Stain PENDING  Incomplete   Culture   Final    CULTURE REINCUBATED FOR BETTER GROWTH Performed at [facility], [place] [address], [place], [place] [address]    Report Status PENDING  Incomplete  Culture, blood (routine x 2)     Status: None (Preliminary result)   Collection Time: 10/25/20  3:59 PM   Specimen: BLOOD  Result Value Ref Range Status   Specimen Description BLOOD LEFT ARM  Final   Special Requests   Final    BOTTLES DRAWN AEROBIC AND ANAEROBIC Blood Culture adequate volume   Culture   Final    NO GROWTH < 24 HOURS Performed at [facility], [address] [place], [place] [address]    Report Status PENDING  Incomplete  Culture, blood (routine x 2)     Status: None (Preliminary result)   Collection Time: 10/25/20  3:59 PM   Specimen: BLOOD  Result Value Ref Range Status   Specimen Description BLOOD RIGHT ARM  Final   Special Requests   Final    BOTTLES DRAWN AEROBIC AND ANAEROBIC Blood Culture results may not be optimal due to an excessive volume of blood received in culture bottles   Culture   Final    NO GROWTH < 24 HOURS Performed at [facility], [address], [place], [place] [address]    Report Status PENDING  Incomplete  SARS CORONAVIRUS 2 (TAT 6-24 HRS) Nasopharyngeal  Nasopharyngeal Swab     Status: None   Collection Time: 10/25/20  4:30 PM   Specimen: Nasopharyngeal Swab  Result Value Ref Range Status   SARS Coronavirus 2 NEGATIVE NEGATIVE Final    Comment: (NOTE) SARS-CoV-2 target nucleic acids are NOT DETECTED.  The SARS-CoV-2 RNA is generally detectable in upper and lower respiratory specimens during the acute phase of infection. Negative results do not preclude SARS-CoV-2 infection, do not rule out co-infections with other pathogens, and should not be used as the sole basis for treatment or other patient management decisions. Negative results must be combined with clinical observations, patient history, and epidemiological information. The expected result is Negative.  Fact Sheet for Patients: SugarRoll.be  Fact Sheet for Healthcare Providers: https://www.woods-mathews.com/  This test is not yet approved or cleared by the [place] FDA and  has been authorized for detection and/or diagnosis of SARS-CoV-2 by FDA under an Emergency Use Authorization (EUA). This EUA will remain  in effect (meaning this test can be used) for the duration of the COVID-19 declaration under Se ction 564(b)(1) of the Act, 21 U.S.C. section 360bbb-3(b)(1), unless the authorization is terminated or revoked sooner.  Performed at [facility], [place] [address], [place], [place] [address]   Aerobic Culture w Gram Stain (superficial specimen)     Status: None (Preliminary result)   Collection Time: 10/25/20  5:10 PM   Specimen: Ankle; Wound  Result Value Ref Range Status   Specimen Description   Final    ANKLE  Performed at [facility], [address] [place], [place] [address]    Special Requests   Final    NONE Performed at [facility], [address], [place], [place] [address]    Gram Stain PENDING  Incomplete   Culture   Final    CULTURE REINCUBATED FOR BETTER GROWTH Performed at [facility],  [place] [address] [place], [place] [address]    Report Status PENDING  Incomplete    Radiology Reports DG Chest 2 View  Result Date: 10/25/2020 CLINICAL DATA:  CHF with leg swelling and ulcer to right lateral malleolus. EXAM: CHEST - 2 VIEW COMPARISON:  09/26/20 FINDINGS: There is mild cardiac enlargement, stable from previous exam. No pleural effusion or interstitial edema identified. No airspace densities. IMPRESSION: No acute findings.  No evidence for congestive heart failure. Electronically Signed   By: Kerby Moors M.D.   On: 10/25/2020 14:21   DG Ankle Complete Right  Result Date: 10/25/2020 CLINICAL DATA:  CHF, lower extremity swelling, lateral malleolar ulcer with concern for infection, no reported injury EXAM: RIGHT ANKLE - COMPLETE 3+ VIEW COMPARISON:  09/26/2020 right ankle radiographs FINDINGS: Diffuse right ankle soft tissue swelling. Small soft tissue defect at the lateral malleolus compatible with reported ulcer. Subtle loss of cortical line at the lateral distal margin of the lateral malleolus suspicious for early bony erosion. No fracture. No subluxation. Severe tibiotalar osteoarthritis. Intact staples at the lateral aspect of the calcaneus. Partially visualized intact screw at the base of fifth metatarsal. No focal osseous lesions. IMPRESSION: Diffuse right ankle soft tissue swelling. Small soft tissue defect at the lateral malleolus compatible with reported ulcer. Subtle loss of cortical line at the lateral distal margin of the lateral malleolus, suspicious for early bony erosion due to osteomyelitis. Consider MRI of the right ankle without IV contrast for further evaluation as clinically warranted. Electronically Signed   By: Ilona Sorrel M.D.   On: 10/25/2020 14:24   MR ANKLE RIGHT WO CONTRAST  Result Date: 10/26/2020 CLINICAL DATA:  Lateral right ankle ulceration EXAM: MRI OF THE RIGHT ANKLE WITHOUT CONTRAST TECHNIQUE: Multiplanar, multisequence MR imaging of the ankle was performed. No  intravenous contrast was administered. COMPARISON:  X-ray 10/25/2020 FINDINGS: TENDONS Peroneal: Tendinosis with longitudinal split tear of the infra-malleolar aspect of the peroneus brevis tendon. Intact peroneus longus tendon. No tenosynovitis. Posteromedial: Intact tibialis posterior, flexor hallucis longus and flexor digitorum longus tendons. Anterior: Intact tibialis anterior, extensor hallucis longus and extensor digitorum longus tendons. Achilles: Intact. Plantar Fascia: Intact. LIGAMENTS Lateral: Anterior talofibular ligament and calcaneofibular ligaments are not clearly seen, and likely chronically torn. Posterior talofibular ligament is heterogeneous but intact. Intact anterior and posterior tibiofibular ligaments. Medial: Deltoid ligament appears chronically torn. Portions of the spring ligament complex are ill-defined. CARTILAGE Ankle Joint: Severe tibiotalar osteoarthritis with full-thickness cartilage loss most pronounced anteriorly. No joint effusion. Prominent osseous densities along the posterior aspect of the tibiotalar and subtalar joints, largest measuring up to 2.4 cm. Subtalar Joints/Sinus Tarsi: Moderate arthropathy of the posterior subtalar joint. No effusion. Preservation of the anatomic fat within the sinus tarsi. Bones: No acute fracture or dislocation. No bony erosion or cortical destruction. No marrow edema. Well healed calcaneal osteotomy. Metallic susceptibility artifact from hardware within the fifth metatarsal. Other: Soft tissue swelling with superficial ulceration overlying the lateral malleolus. No organized fluid collection. Diffuse edema-like signal of the visualized foot and lower leg musculature. There is also fatty atrophy of the visualized musculature compatible with chronic denervation. IMPRESSION:  1. Soft tissue swelling with superficial ulceration overlying the lateral malleolus. No organized fluid collection. No evidence of osteomyelitis. 2. Severe tibiotalar  osteoarthritis with prominent osseous densities along the posterior aspect of the tibiotalar and subtalar joints, largest measuring up to 2.4 cm. 3. Fatty atrophy and edema throughout the visualized foot and lower leg musculature, likely representing a combination of denervation changes and/or myositis. Findings are in agreement with the preliminary report provided by Dr. Francoise Ceo. Preliminary report was called to the ordering provider Eustaquio Maize, PA at 5:54 p.m. on 10/25/2020 by Dr. Francoise Ceo. Electronically Signed   By: Davina Poke D.O.   On: 10/26/2020 08:32    SIGNED: Deatra James, MD, FHM. [facility],  Pager (please use [email or web address] to page/text) Please use Epic Secure Chat for non-urgent communication (7AM-7PM)  If 7PM-7AM, please contact night-coverage [email or web address], 10/26/2020, 11:48 AM

## 2020-10-26 NOTE — H&P (Signed)
TRH H&P    Patient Demographics:    Danny [name], is a 65 y.o. male  MRN: [id number]  DOB - 23-Jun-1956  Admit Date - 10/25/2020  Referring MD/NP/PA: Long  Outpatient Primary MD for the patient is Celene Squibb, MD  Patient coming from: [facility]  Chief complaint- Cellulitis   HPI:    Danny [name]  is a 65 y.o. male, with history of hypertension, diabetes mellitus type 2, CKD, anemia, and more presents the ED with a chief complaint of leg swelling and weakness.  Patient reports that he has had venous stasis changes of his legs for years, can remember when it started getting worse.  He reports he was diagnosed with Sital this in the beginning of April and sent home with doxycycline.  He reports that he ran out of doxycycline after taking the full prescription.  He reports that he was fatigued, and had generalized weakness which prevented him from being able to go to any follow-up appointment for the cellulitis.  He reports that he can transport him and he can go on his own.  Today he fell when transitioning from recliner to wheelchair.  He reports he can get up off the ground so EMS was called to get him up.  They saw his legs and advised that he come into the ED.  Patient reports that his legs have been draining purulent and serous drainage.  He cleans his legs with which hazel at home but has not done that in a couple days.  He reports that his socks were crossed into his legs when he came into the ED.  He reports his left leg has more pain in his right leg, but his right leg has more weakness in his left leg.  This commendation has been going on for 1 or 2 years.  He denies any fevers.  Admits to constipation and dysuria.  Other than this, history is Contorted.  Patient reports that he has had fatigue and generalized weakness since a prostate procedure in July 2021.  He reports a hospitalization in August of last year as well, that  made his fatigue and generalized weakness worse.  It sounds as though he has been just deconditioned since those 2 hospitalizations.  He reports that since then he has tried home health and home physical therapy.  He is reluctant to try home physical therapy again because he was not happy with the physical therapist that was coming to his home before.  He reports that that was with [facility] and he would not do physical therapy with them again.  In the ED Temp 97.5, heart rate 81, respiratory rate 16-18, blood pressure 132/76, satting at 97% No leukocytosis with white blood cell count of 7.4, hemoglobin 8.9 Chemistry panel reveals an elevated BUN and creatinine that are close to his baseline at 47 and 3.79 Glucose is 151 UA is not indicative of UTI CRP is 3.4, sed rate is 69 COVID pending Blood cultures pending Official read of the MR ankle pending-ED provider reports the preliminary  read was no concern for osteomyelitis EKG shows a heart rate of 97, QTc 477, PVCs Chest x-ray is negative Alk phos is elevated 131 Admission was requested to antibiotics and wound care to patient.  Does not have reliable means to follow-up outpatient    Review of systems:    In addition to the HPI above,  No Fever-chills, No Headache, No changes with Vision or hearing, No problems swallowing food or Liquids, No Chest pain, Cough or Shortness of Breath, No Abdominal pain, No Nausea or Vomiting, admits to chronic constipation No Blood in stool or Urine, No dysuria, No new skin rashes or bruises, No new weakness, tingling, numbness in any extremity, No recent weight gain or loss, No polyuria, polydypsia or polyphagia, No significant Mental Stressors.  All other systems reviewed and are negative.    Past History of the following :    Past Medical History:  Diagnosis Date  . Anemia   . Arthritis   . CKD (chronic kidney disease), stage IV ([place])   . Diabetes mellitus without complication ([place])    . Foot ulcer ([place])   . Hypertension   . Urinary retention       Past Surgical History:  Procedure Laterality Date  . ANKLE SURGERY Right   . CHOLECYSTECTOMY    . FOOT SURGERY Right   . TRANSURETHRAL RESECTION OF PROSTATE N/A 01/15/2020   Procedure: TRANSURETHRAL RESECTION OF THE PROSTATE (TURP)  with General anesthesia and spinal;  Surgeon: Cleon Gustin, MD;  Location: [facility];  Service: Urology;  Laterality: N/A;      Social History:      Social History   Tobacco Use  . Smoking status: Never Smoker  . Smokeless tobacco: Never Used  Substance Use Topics  . Alcohol use: No       Family History :     Family History  Problem Relation Age of Onset  . Diabetes Mother   . Heart attack Mother   . Heart attack Father   . Diabetes Brother       Home Medications:   Prior to Admission medications   Medication Sig Start Date End Date Taking? Authorizing Provider  ACCU-CHEK SMARTVIEW test strip 1 each by Other route as needed.  05/15/19   [provider]  acetaminophen (TYLENOL) 500 MG tablet Take 1,000 mg by mouth every 6 (six) hours as needed for moderate pain or headache.    [provider]  amLODipine (NORVASC) 10 MG tablet Take 10 mg by mouth daily.  10/14/19   [provider]  Ascorbic Acid (VITAMIN C) 500 MG CHEW Chew 1,000 mg by mouth daily.    [provider]  aspirin 325 MG tablet Take 325 mg by mouth daily.    [provider]  cholecalciferol (VITAMIN D3) 25 MCG (1000 UNIT) tablet Take 1,000 Units by mouth daily.    [provider]  doxycycline (VIBRAMYCIN) 100 MG capsule Take 1 capsule (100 mg total) by mouth 2 (two) times daily. 09/26/20   Noemi Chapel, MD  epoetin alfa-epbx (RETACRIT) 98921 UNIT/ML injection 8,000 Units every 14 (fourteen) days. 08/17/20   Liana Gerold, MD  ferrous sulfate 325 (65 FE) MG tablet Take 325 mg by mouth 2 (two) times daily with a meal.    [provider]   hydrALAZINE (APRESOLINE) 100 MG tablet Take by mouth. 09/03/20 09/03/21  [provider]  ketotifen (ALLERGY EYE DROPS) 0.025 % ophthalmic solution Place 1 drop into both eyes daily  as needed (allergies/dry eyes). 03/30/20   Barton Dubois, MD  Magnesium 100 MG CAPS Take 100 mg by mouth daily.     [provider]  metoprolol succinate (TOPROL-XL) 100 MG 24 hr tablet Take 1 tablet (100 mg total) by mouth every evening. 03/20/19   Gerlene Fee, NP  Multiple Vitamin (MULTIVITAMIN WITH MINERALS) TABS tablet Take 1 tablet by mouth daily.    [provider]  NOVOLIN 70/30 RELION (70-30) 100 UNIT/ML injection Inject 20-100 Units into the skin in the morning and at bedtime.  08/13/19   [provider]  Omega-3 Fatty Acids (FISH OIL PO) Take 2,400 mg by mouth 2 (two) times daily.    [provider]  pravastatin (PRAVACHOL) 80 MG tablet Take 1 tablet (80 mg total) by mouth every evening. 03/20/19   Gerlene Fee, NP  torsemide (DEMADEX) 20 MG tablet Take 2 tablets by mouth daily; okay to use extra 20 mg daily if patient gain more than 3 pounds overnight or more than 5 pounds in a week. 03/30/20   Barton Dubois, MD  vitamin B-12 (CYANOCOBALAMIN) 1000 MCG tablet Take 1,000 mcg by mouth daily.    [provider]  Zinc 30 MG CAPS Take by mouth.    [provider]     Allergies:     Allergies  Allergen Reactions  . Dust Mite Extract Itching and Other (See Comments)    Unknown reaction-potential shortness of breath  . Prednisone Nausea And Vomiting  . Rocephin [Ceftriaxone] Nausea And Vomiting     Physical Exam:   Vitals  Blood pressure (!) 147/80, pulse 92, temperature 98 F (36.7 C), resp. rate 20, height _0  (1.854 m), weight 126.5 kg, SpO2 100 %.  1.  General: Patient lying supine in bed, chronically ill-appearing, no acute distress  2. Psychiatric: Mood and behavior normal for situation, alert and oriented x3, cooperative  with exam  3. Neurologic: Face is symmetric, speech and language are normal, oriented x3, no focal deficit on limited exam  4. HEENMT:  Head is atraumatic, normocephalic, pupils are reactive to light, neck is supple, trachea is midline, mucous membranes are moist  5. Respiratory : Lungs are clear to auscultation bilaterally without increased work of breathing, no rhonchi, rales, wheezes, no cyanosis  6. Cardiovascular : Heart rate is normal, rhythm is regular, no murmurs rubs or gallops, pitting edema present to knee, venous stasis changes of the lower extremities present  7. Gastrointestinal:  Abdomen is soft, nondistended, nontender to palpation, no palpable masses  8. Skin:  Legs have venous stasis changes bilaterally, right lateral malleolus has a diabetic ulcer with necrotic center, granulation tissue, surrounding cellulitis, crusting, serous drainage at time of exam  9.Musculoskeletal:  Right lateral malleolus ulcer, no calf tenderness, decrease sensitivity in the lower extremities bilaterally from neuropathy    Data Review:    CBC Recent Labs  Lab 10/25/20 1327  WBC 7.4  HGB 8.9*  HCT 28.0*  PLT 188  MCV 85.1  MCH 27.1  MCHC 31.8  RDW 16.9*  LYMPHSABS 0.6*  MONOABS 0.5  EOSABS 0.3  BASOSABS 0.0   ------------------------------------------------------------------------------------------------------------------  Results for orders placed or performed during the hospital encounter of 10/25/20 (from the past 48 hour(s))  Comprehensive metabolic panel     Status: Abnormal   Collection Time: 10/25/20  1:27 PM  Result Value Ref Range   Sodium 137 135 - 145 mmol/L   Potassium 4.2 3.5 - 5.1 mmol/L   Chloride  107 98 - 111 mmol/L   CO2 22 22 - 32 mmol/L   Glucose, Bld 151 (H) 70 - 99 mg/dL    Comment: Glucose reference range applies only to samples taken after fasting for at least 8 hours.   BUN 47 (H) 8 - 23 mg/dL   Creatinine, Ser 3.79 (H) 0.61 - 1.24 mg/dL    Calcium 8.6 (L) 8.9 - 10.3 mg/dL   Total Protein 7.2 6.5 - 8.1 g/dL   Albumin 2.8 (L) 3.5 - 5.0 g/dL   AST 23 15 - 41 U/L   ALT 26 0 - 44 U/L   Alkaline Phosphatase 131 (H) 38 - 126 U/L   Total Bilirubin 0.7 0.3 - 1.2 mg/dL   GFR, Estimated 17 (L) >60 mL/min    Comment: (NOTE) Calculated using the CKD-EPI Creatinine Equation (2021)    Anion gap 8 5 - 15    Comment: Performed at [facility], [address], [place], [place] [address]  CBC with Differential     Status: Abnormal   Collection Time: 10/25/20  1:27 PM  Result Value Ref Range   WBC 7.4 4.0 - 10.5 K/uL   RBC 3.29 (L) 4.22 - 5.81 MIL/uL   Hemoglobin 8.9 (L) 13.0 - 17.0 g/dL   HCT 28.0 (L) 39.0 - 52.0 %   MCV 85.1 80.0 - 100.0 fL   MCH 27.1 26.0 - 34.0 pg   MCHC 31.8 30.0 - 36.0 g/dL   RDW 16.9 (H) 11.5 - 15.5 %   Platelets 188 150 - 400 K/uL   nRBC 0.0 0.0 - 0.2 %   Neutrophils Relative % 80 %   Neutro Abs 6.0 1.7 - 7.7 K/uL   Lymphocytes Relative 8 %   Lymphs Abs 0.6 (L) 0.7 - 4.0 K/uL   Monocytes Relative 7 %   Monocytes Absolute 0.5 0.1 - 1.0 K/uL   Eosinophils Relative 4 %   Eosinophils Absolute 0.3 0.0 - 0.5 K/uL   Basophils Relative 1 %   Basophils Absolute 0.0 0.0 - 0.1 K/uL   Immature Granulocytes 0 %   Abs Immature Granulocytes 0.02 0.00 - 0.07 K/uL    Comment: Performed at [facility], [address] [place], [place] [address]  Lactic acid, plasma     Status: None   Collection Time: 10/25/20  1:27 PM  Result Value Ref Range   Lactic Acid, Venous 0.9 0.5 - 1.9 mmol/L    Comment: Performed at [facility], [address] [place], [place] [address]  Brain natriuretic peptide     Status: Abnormal   Collection Time: 10/25/20  1:27 PM  Result Value Ref Range   B Natriuretic Peptide 351.0 (H) 0.0 - 100.0 pg/mL    Comment: Performed at [facility], [address], [place], [place] [address]  Urinalysis, Routine w reflex microscopic Urine, Clean Catch     Status: Abnormal   Collection Time: 10/25/20  3:16  PM  Result Value Ref Range   Color, Urine YELLOW YELLOW   APPearance CLEAR CLEAR   Specific Gravity, Urine 1.010 1.005 - 1.030   pH 6.0 5.0 - 8.0   Glucose, UA 150 (A) NEGATIVE mg/dL   Hgb urine dipstick SMALL (A) NEGATIVE   Bilirubin Urine NEGATIVE NEGATIVE   Ketones, ur NEGATIVE NEGATIVE mg/dL   Protein, ur >=300 (A) NEGATIVE mg/dL   Nitrite NEGATIVE NEGATIVE   Leukocytes,Ua NEGATIVE NEGATIVE   RBC / HPF 6-10 0 - 5 RBC/hpf   WBC, UA 0-5 0 -  5 WBC/hpf   Bacteria, UA NONE SEEN NONE SEEN   Squamous Epithelial / LPF 0-5 0 - 5   Mucus PRESENT     Comment: Performed at [facility], [address] [place], [place] [address]  Lactic acid, plasma     Status: None   Collection Time: 10/25/20  3:19 PM  Result Value Ref Range   Lactic Acid, Venous 0.8 0.5 - 1.9 mmol/L    Comment: Performed at [facility], [address], [place], [place] [address]  POC CBG, ED     Status: Abnormal   Collection Time: 10/25/20  3:20 PM  Result Value Ref Range   Glucose-Capillary 148 (H) 70 - 99 mg/dL    Comment: Glucose reference range applies only to samples taken after fasting for at least 8 hours.  Aerobic Culture w Gram Stain (superficial specimen)     Status: None (Preliminary result)   Collection Time: 10/25/20  3:29 PM   Specimen: Skin, Cyst/Tag/Debridement; Wound  Result Value Ref Range   Specimen Description      SKIN Performed at [facility], [place] [address], [place], [place] [address]    Special Requests      NONE Performed at [facility], [address] [place], [place] [address]    Gram Stain PENDING    Culture PENDING    Report Status PENDING   SARS CORONAVIRUS 2 (TAT 6-24 HRS) Nasopharyngeal Nasopharyngeal Swab     Status: None   Collection Time: 10/25/20  4:30 PM   Specimen: Nasopharyngeal Swab  Result Value Ref Range   SARS Coronavirus 2 NEGATIVE NEGATIVE    Comment: (NOTE) SARS-CoV-2 target nucleic acids are NOT DETECTED.  The SARS-CoV-2 RNA is generally detectable in  upper and lower respiratory specimens during the acute phase of infection. Negative results do not preclude SARS-CoV-2 infection, do not rule out co-infections with other pathogens, and should not be used as the sole basis for treatment or other patient management decisions. Negative results must be combined with clinical observations, patient history, and epidemiological information. The expected result is Negative.  Fact Sheet for Patients: SugarRoll.be  Fact Sheet for Healthcare Providers: https://www.woods-mathews.com/  This test is not yet approved or cleared by the [place] FDA and  has been authorized for detection and/or diagnosis of SARS-CoV-2 by FDA under an Emergency Use Authorization (EUA). This EUA will remain  in effect (meaning this test can be used) for the duration of the COVID-19 declaration under Se ction 564(b)(1) of the Act, 21 U.S.C. section 360bbb-3(b)(1), unless the authorization is terminated or revoked sooner.  Performed at [facility], [place] [address], [place], [place] [address]   Sedimentation rate     Status: Abnormal   Collection Time: 10/25/20  9:28 PM  Result Value Ref Range   Sed Rate 69 (H) 0 - 16 mm/hr    Comment: Performed at [facility], [address], [place], [place] [address]  C-reactive protein     Status: Abnormal   Collection Time: 10/25/20  9:28 PM  Result Value Ref Range   CRP 3.4 (H) <1.0 mg/dL    Comment: Performed at [facility], [address], [place], [place] [address]  Glucose, capillary     Status: Abnormal   Collection Time: 10/26/20  1:27 AM  Result Value Ref Range   Glucose-Capillary 118 (H) 70 - 99 mg/dL    Comment: Glucose reference range applies only to samples taken after fasting for at least 8 hours.  Chemistries  Recent Labs  Lab 10/25/20 1327  NA 137  K 4.2  CL 107  CO2 22  GLUCOSE 151*  BUN 47*  CREATININE 3.79*  CALCIUM 8.6*  AST 23  ALT 26   ALKPHOS 131*  BILITOT 0.7   ------------------------------------------------------------------------------------------------------------------  ------------------------------------------------------------------------------------------------------------------ GFR: Estimated Creatinine Clearance: 26.4 mL/min (A) (by C-G formula based on SCr of 3.79 mg/dL (H)). Liver Function Tests: Recent Labs  Lab 10/25/20 1327  AST 23  ALT 26  ALKPHOS 131*  BILITOT 0.7  PROT 7.2  ALBUMIN 2.8*   No results for input(s): LIPASE, AMYLASE in the last 168 hours. No results for input(s): AMMONIA in the last 168 hours. Coagulation Profile: No results for input(s): INR, PROTIME in the last 168 hours. Cardiac Enzymes: No results for input(s): CKTOTAL, CKMB, CKMBINDEX, TROPONINI in the last 168 hours. BNP (last 3 results) No results for input(s): PROBNP in the last 8760 hours. HbA1C: No results for input(s): HGBA1C in the last 72 hours. CBG: Recent Labs  Lab 10/25/20 1520 10/26/20 0127  GLUCAP 148* 118*   Lipid Profile: No results for input(s): CHOL, HDL, LDLCALC, TRIG, CHOLHDL, LDLDIRECT in the last 72 hours. Thyroid Function Tests: No results for input(s): TSH, T4TOTAL, FREET4, T3FREE, THYROIDAB in the last 72 hours. Anemia Panel: No results for input(s): VITAMINB12, FOLATE, FERRITIN, TIBC, IRON, RETICCTPCT in the last 72 hours.  --------------------------------------------------------------------------------------------------------------- Urine analysis:    Component Value Date/Time   COLORURINE YELLOW 10/25/2020 1516   APPEARANCEUR CLEAR 10/25/2020 1516   APPEARANCEUR Clear 09/10/2020 1122   LABSPEC 1.010 10/25/2020 1516   PHURINE 6.0 10/25/2020 1516   GLUCOSEU 150 (A) 10/25/2020 1516   HGBUR SMALL (A) 10/25/2020 1516   BILIRUBINUR NEGATIVE 10/25/2020 1516   BILIRUBINUR Negative 09/10/2020 1122   KETONESUR NEGATIVE 10/25/2020 1516   PROTEINUR >=300 (A) 10/25/2020 1516   NITRITE  NEGATIVE 10/25/2020 1516   LEUKOCYTESUR NEGATIVE 10/25/2020 1516      Imaging Results:    DG Chest 2 View  Result Date: 10/25/2020 CLINICAL DATA:  CHF with leg swelling and ulcer to right lateral malleolus. EXAM: CHEST - 2 VIEW COMPARISON:  09/26/20 FINDINGS: There is mild cardiac enlargement, stable from previous exam. No pleural effusion or interstitial edema identified. No airspace densities. IMPRESSION: No acute findings.  No evidence for congestive heart failure. Electronically Signed   By: Kerby Moors M.D.   On: 10/25/2020 14:21   DG Ankle Complete Right  Result Date: 10/25/2020 CLINICAL DATA:  CHF, lower extremity swelling, lateral malleolar ulcer with concern for infection, no reported injury EXAM: RIGHT ANKLE - COMPLETE 3+ VIEW COMPARISON:  09/26/2020 right ankle radiographs FINDINGS: Diffuse right ankle soft tissue swelling. Small soft tissue defect at the lateral malleolus compatible with reported ulcer. Subtle loss of cortical line at the lateral distal margin of the lateral malleolus suspicious for early bony erosion. No fracture. No subluxation. Severe tibiotalar osteoarthritis. Intact staples at the lateral aspect of the calcaneus. Partially visualized intact screw at the base of fifth metatarsal. No focal osseous lesions. IMPRESSION: Diffuse right ankle soft tissue swelling. Small soft tissue defect at the lateral malleolus compatible with reported ulcer. Subtle loss of cortical line at the lateral distal margin of the lateral malleolus, suspicious for early bony erosion due to osteomyelitis. Consider MRI of the right ankle without IV contrast for further evaluation as clinically warranted. Electronically Signed   By: Ilona Sorrel M.D.   On: 10/25/2020 14:24       Assessment & Plan:  Principal Problem:   Cellulitis Active Problems:   Controlled type 2 diabetes mellitus with stage 3 chronic kidney disease, with long-term current use of insulin (HCC)   Moderate protein-calorie  malnutrition (HCC)   Medical non-compliance   1. Cellulitis  1. Likely a component of cellulitis and a component of venous stasis contributing to the open wounds on patient's leg 2. Came to the ER earlier in April and was started on doxycycline, reports taking all of it but then having no way to follow-up with any provider secondary to transportation barriers, now legs worsening 3. Vancomycin started in the ED 4. Continue vancomycin 5. Blood cultures pending 6. MRI ankle official result pending, ED provider reports that the radiologist gave her preliminary report of no osteomyelitis suspected 7. CRP 3.4, ESR 69 8. Wound care consult 9. Continue to monitor 2. Fall and generalized weakness 1. Mechanical fall no LOC, did not hit head 2. Consult PT 3. DMII 1. Blood sugar 151 in the ED 2. Takes 70/30 20-100 units at home 3. Continue Lantus 20 units 4. Sliding scale coverage 5. Carb modified diet 4. Moderate protein cal malnutrition 1. Albumin 2.8 2. Counseled on the importance of nutrient dense diet 3. Glucerna shakes 4. Continue to monitor 5. Medical non compliance 1. TOC consulted for possible PT at home?  Any assistance with transportation for medical appointments? 6.    DVT Prophylaxis-Heparin- SCDs   AM Labs Ordered, also please review Full Orders  Family Communication:  no family at bedside status.  Code Status: Full  Admission status: Observation Time spent in minutes : [place]

## 2020-10-26 NOTE — Plan of Care (Signed)
  Problem: Acute Rehab PT Goals(only PT should resolve) Goal: Pt Will Go Supine/Side To Sit Outcome: Progressing Flowsheets (Taken 10/26/2020 1344) Pt will go Supine/Side to Sit: with minimal assist Goal: Patient Will Transfer Sit To/From Stand Outcome: Progressing Flowsheets (Taken 10/26/2020 1344) Patient will transfer sit to/from stand:  with moderate assist  with +2 Goal: Pt Will Transfer Bed To Chair/Chair To Bed Outcome: Progressing Flowsheets (Taken 10/26/2020 1344) Pt will Transfer Bed to Chair/Chair to Bed:  with mod assist  with +2 Goal: Pt/caregiver will Perform Home Exercise Program Outcome: Progressing Flowsheets (Taken 10/26/2020 1344) Pt/caregiver will Perform Home Exercise Program:  For increased ROM  For increased strengthening  For improved balance  With Supervision, verbal cues required/provided   Talbot Grumbling PT, DPT 10/26/20, 1:45 PM

## 2020-10-27 ENCOUNTER — Other Ambulatory Visit (HOSPITAL_COMMUNITY): Payer: Self-pay

## 2020-10-27 ENCOUNTER — Telehealth (HOSPITAL_COMMUNITY): Payer: Medicare HMO | Admitting: Physician Assistant

## 2020-10-27 ENCOUNTER — Telehealth (HOSPITAL_COMMUNITY): Payer: Self-pay | Admitting: Physician Assistant

## 2020-10-27 DIAGNOSIS — D631 Anemia in chronic kidney disease: Secondary | ICD-10-CM

## 2020-10-27 DIAGNOSIS — L97319 Non-pressure chronic ulcer of right ankle with unspecified severity: Secondary | ICD-10-CM

## 2020-10-27 DIAGNOSIS — N184 Chronic kidney disease, stage 4 (severe): Secondary | ICD-10-CM

## 2020-10-27 DIAGNOSIS — L97309 Non-pressure chronic ulcer of unspecified ankle with unspecified severity: Secondary | ICD-10-CM

## 2020-10-27 DIAGNOSIS — Z9119 Patient's noncompliance with other medical treatment and regimen: Secondary | ICD-10-CM | POA: Diagnosis not present

## 2020-10-27 DIAGNOSIS — L03119 Cellulitis of unspecified part of limb: Secondary | ICD-10-CM

## 2020-10-27 DIAGNOSIS — E11622 Type 2 diabetes mellitus with other skin ulcer: Secondary | ICD-10-CM

## 2020-10-27 DIAGNOSIS — I878 Other specified disorders of veins: Secondary | ICD-10-CM | POA: Diagnosis not present

## 2020-10-27 LAB — BASIC METABOLIC PANEL
Anion gap: 7 (ref 5–15)
BUN: 48 mg/dL — ABNORMAL HIGH (ref 8–23)
CO2: 21 mmol/L — ABNORMAL LOW (ref 22–32)
Calcium: 8.3 mg/dL — ABNORMAL LOW (ref 8.9–10.3)
Chloride: 109 mmol/L (ref 98–111)
Creatinine, Ser: 3.81 mg/dL — ABNORMAL HIGH (ref 0.61–1.24)
GFR, Estimated: 17 mL/min — ABNORMAL LOW (ref >60)
Glucose, Bld: 85 mg/dL (ref 70–99)
Potassium: 4.1 mmol/L (ref 3.5–5.1)
Sodium: 137 mmol/L (ref 135–145)

## 2020-10-27 LAB — CBC
HCT: 26.6 % — ABNORMAL LOW (ref 39.0–52.0)
Hemoglobin: 8.5 g/dL — ABNORMAL LOW (ref 13.0–17.0)
MCH: 27.2 pg (ref 26.0–34.0)
MCHC: 32 g/dL (ref 30.0–36.0)
MCV: 85 fL (ref 80.0–100.0)
Platelets: 186 10*3/uL (ref 150–400)
RBC: 3.13 MIL/uL — ABNORMAL LOW (ref 4.22–5.81)
RDW: 16.7 % — ABNORMAL HIGH (ref 11.5–15.5)
WBC: 6.8 10*3/uL (ref 4.0–10.5)
nRBC: 0 % (ref 0.0–0.2)

## 2020-10-27 LAB — GLUCOSE, CAPILLARY
Glucose-Capillary: 82 mg/dL (ref 70–99)
Glucose-Capillary: 125 mg/dL — ABNORMAL HIGH (ref 70–99)
Glucose-Capillary: 163 mg/dL — ABNORMAL HIGH (ref 70–99)
Glucose-Capillary: 136 mg/dL — ABNORMAL HIGH (ref 70–99)

## 2020-10-27 LAB — AEROBIC CULTURE W GRAM STAIN (SUPERFICIAL SPECIMEN)

## 2020-10-27 MED ORDER — CEFAZOLIN SODIUM-DEXTROSE 1-4 GM-%(50ML) IV SOLR
1.0000 g | Freq: Two times a day (BID) | INTRAVENOUS | Status: DC
  Administered 2020-10-27 – 2020-11-05 (×18): 1 g via INTRAVENOUS
  Filled 2020-10-27 (×27): qty 50

## 2020-10-27 MED ORDER — SODIUM CHLORIDE 0.9 % IV SOLN: 1.0000 g | Freq: Two times a day (BID) | INTRAVENOUS | Status: DC

## 2020-10-27 MED ORDER — DOXYCYCLINE HYCLATE 100 MG PO TABS
100.0000 mg | ORAL_TABLET | Freq: Two times a day (BID) | ORAL | Status: DC
  Administered 2020-10-27 – 2020-11-05 (×18): 100 mg via ORAL
  Filled 2020-10-27 (×18): qty 1

## 2020-10-27 NOTE — Progress Notes (Signed)
Physical Therapy Treatment Patient Details Name: Danny [name] MRN: [id number] DOB: 07/02/1955 Today's Date: 10/27/2020    History of Present Illness Danny [name] is a 65 y.o. male presents with c/o leg swelling and weakness, venous stasis changes to legs for years. R ankle MRI negaitve for osteomyelitis. PMH: HTN, diabetes, CKD, anemia, TURP 12/2019    PT Comments    Pt states he has not been using his compression garments as they were lost the last time he was in the hospital.   Follow Up Recommendations   SNF     Equipment Recommendations    none    Recommendations for Other Services  OT     Precautions / Restrictions      Mobility  Bed Mobility Overal bed mobility: Needs Assistance                  Transfers Overall transfer level: Needs assistance     Sit to Stand: Mod assist; completed 3 times to improve LE strength.  Pt stays bent over his walker but this is normal for him.  Max standing ability 20 seconds                     Cognition    normal                                          Exercises General Exercises - Lower Extremity Ankle Circles/Pumps: Both;Seated;10 reps Long Arc Quad: Both;10 reps Hip ABduction/ADduction: 10 reps;Seated Toe Raises: 10 reps;Seated Marching x 10        Pertinent Vitals/Pain Pain Assessment: No/denies pain       Prior Function     up until 4 months ago pt was I with wheelchair able to drive, get w/c into the car to get himself to and from MD appointments.        PT Goals (current goals can now be found in the care plan section)      Frequency     TIW      PT Plan  Continue with strengthening and functional activity to improve mobility status           End of Session               Time: 3419-6222 PT Time Calculation (min) (ACUTE ONLY): 35 min  Charges:  $Therapeutic Exercise: 8-22 mins $Therapeutic Activity: 8-22 mins                      Rayetta Humphrey, PT  CLT [phone] 10/27/2020, 11:03 AM

## 2020-10-27 NOTE — Progress Notes (Signed)
Late entry: Wound care provided to pt per order of wound care nurse.Both legs cleaned with warm water and soap, was able to remove a lot of dried exudate and skin from bilateral lower legs and feet. Pt tolerated well. Right lateral malleolus wound measures 4cm x 4 cm x 0.5 cm, fresh bleeding noted from area as MD Constance Haw has just performed punch skin biopsy. Santyl cream applied to this area only. Remainder of leg coated with eucerin cream. Abd dsg applied over lateral malleolus wound and secured with kerlix wrap to entire lower leg and 6" ace applied for compression.  Left anterior pretibial wound covered with xerofoam guaze. This area measures 2.5cm x 2 cm. Pt has three additional areas on left lower leg: Left lower anterior wound 1cm x 0.5 cm Left lower medial wound 4cm x 2 cm Left knee with scabbed area 1.5cm x 0.75cm  These areas were also with xerofoam gauze to fit. Remainder of left lower leg was coated in eucerin cream. Xerofoam secured with kerlix wrap and then lower leg wrapped with 6 inch ace wrap for compression.

## 2020-10-27 NOTE — Telephone Encounter (Signed)
Patient was unable to make his appointment today, as he is currently hospitalized with cellulitis.  He was seen as an initial consult by Dr. Katragadda on 09/13/2020 due to anemia of CKD that was refractory to Retacrit.  Patient has been receiving Retacrit 8000 units every 2 weeks with Dr. Bhutani, but CBC remains < 9.0.    Work-up ordered by Dr. Katragadda was significant for serum immunofixation showing IgG lambda but SPEP was negative for M-spike.  Free light chain ratio normal at 1.14, but with elevated lambda free light chains 163.5 and elevated kappa light chains 186.6.  Stool cards have not yet been completed.  Patient also has signs of B12 deficiency with elevated methylmalonic acid 721, elevated B12 1695 likely an acute phase reactant and not a true marker the patient's usable B12 levels.  We will schedule the patient for 24-hour urine including UPEP and urine IFE.  Patient encouraged to complete stool cards and return these to the lab as soon as possible.  Patient instructed to call our office when he has been released from the hospital to schedule an in person visit.  We will plan on giving the patient B12 injection at his next visit, and also start him on 1 mg oral vitamin B12 each day.  He does still have some room to increase his dose of Retacrit via Dr. Bhutani.  If anemia remains refractory to these changes, we will likely proceed with bone marrow biopsy.  Patient was called on 10/27/20 at 5:28 PM to communicate the above.   

## 2020-10-27 NOTE — Progress Notes (Signed)
Venous stasis ulcers,wounds noted to lower legs bilateral. Wounds cleaned with soap and water,rinsed and moisturized using Eucerin cream, prior wound care. Dressings used were Kerlix roll gauze,cover with Ace Bandages. Will continue to monitor patient.

## 2020-10-27 NOTE — Progress Notes (Signed)
PROGRESS NOTE  Danny [name] YPP:[id number] DOB: 11/26/1955 DOA: 10/25/2020 PCP: Celene Squibb, MD  Brief History:  65 y/o  with a history of hypertension, diabetes mellitus type 2, CKD stage III, psoriasis, venous stasis dermatitis, lymphedema presenting with 2-week history of his legs edema, pain, erythema and becoming weak.  He reports he was placed on a 2 week course of doxy in the beginning of April with some improvement, but his legs have worsened since finishing doxy 1-2 weeks prior to this admission.  He states he is WC bound and has not ambulated since July 2021.  He reports his legs have been draining yellow clear and cloudy fluid.  In the ED Temp 97.5, heart rate 81, respiratory rate 16-18, blood pressure 132/76, satting at 97% No leukocytosis with white blood cell count of 7.4, hemoglobin 8.9 Chemistry panel reveals an elevated BUN and creatinine that are close to his baseline at 47 and 3.79 Glucose is 151 UA is not indicative of UTI CRP is 3.4, sed rate is 69 COVID neg Blood cultures pending Official read of the MR ankle pending-ED provider reports the preliminary read was no concern for osteomyelitis EKG shows a heart rate of 97, QTc 477, PVCs Chest x-ray is negative Alk phos is elevated 131 Admission was requested to antibiotics and wound care to patient.  Does not have reliable means to follow-up outpatient  Assessment/Plan: 1. Bilateral lower extremity cellulitis -ankle open wound, chronic venous stasisdermatitis 1. Likely a component of cellulitis and a component of venous stasis contributing to the open wounds on patient's leg;  Failed outpatient antibiogics 2. Wound care consulted, also general surgery consulted for evaluation possible debridement.  Appreciate Dr. Pamelia Hoit taken.  Patient not interested in surgery or amputation 3. Patient was initiated on vancomycin, due to acute on chronic kidney disease, switch to linezolid initially.  4. Came to the ER  earlier in April and was started on doxycycline, reports taking all of it but then having no way to follow-up with any provider secondary to transportation barriers, now legs worsening 5. Vancomycin started in the ED... Discontinued 10/26/2020 6. Blood cultures >>>> 7. MRI ankle --no abscess, no osteomyelitis.  Fatty atrophy of visualized musculature = chronic devervation 8. CRP 3.4, ESR 69 9. D/c zyvox.  Start cefazolin and   2. Fall and generalized weakness 1. Mechanical fall no LOC, did not hit head 2. Consult PT-->SNF-->pt refuses 3. We will entertain possible inpatient rehab once stable  3. DMII -uncontrolled 1. CBG QA CHS, SSI coverage, 2. Takes 70/30 20-100 unitsat home 3. Continue Lantus 20 units 4. A1c: 7.6 5. Carb modified diet 4. Moderate protein cal malnutrition 1. Albumin 2.8 2. Counseled on the importance of nutrient dense diet 3. Glucerna shakes 4. Continue to monitor 5. Medical non compliance 1. TOC consulted for possible PT at home?Any assistance with transportation for medical appointments? 6.  CKD 4 -Trend BUN/creatinine closely, -baseline creatinine 3.7-4.0      Status is: Observation  The patient will require care spanning > 2 midnights and should be moved to inpatient because: IV treatments appropriate due to intensity of illness or inability to take PO  Dispo: The patient is from: Home              Anticipated d/c is to: Home              Patient currently is not medically stable to d/c.   Difficult to place  patient no        Family Communication:  no Family at bedside  Consultants:  none  Code Status:  FULL  DVT Prophylaxis:  [place] Heparin    Procedures: As Listed in Progress Note Above  Antibiotics: zyvox 5/3>>5/4     Subjective: Patient denies fevers, chills, headache, chest pain, dyspnea, nausea, vomiting, diarrhea, abdominal pain, dysuria, hematuria, hematochezia, and melena.   Objective: Vitals:   10/26/20 2204  10/27/20 0316 10/27/20 0602 10/27/20 1321  BP: (!) 152/80  127/78 131/71  Pulse: 75  71 83  Resp: _0 Temp: 98.1 F (36.7 C)  98.8 F (37.1 C) 98.7 F (37.1 C)  TempSrc: Oral   Oral  SpO2: 97%  97% 97%  Weight:  126.8 kg    Height:        Intake/Output Summary (Last 24 hours) at 10/27/2020 1706 Last data filed at 10/27/2020 1300 Gross per 24 hour  Intake 600 ml  Output 2500 ml  Net -1900 ml   Weight change: 0.3 kg Exam:   General:  Pt is alert, follows commands appropriately, not in acute distress  HEENT: No icterus, No thrush, No neck mass, [place]/AT  Cardiovascular: RRR, S1/S2, no rubs, no gallops  Respiratory: CTA bilaterally, no wheezing, no crackles, no rhonchi  Abdomen: Soft/+BS, non tender, non distended, no guarding  Extremities: 2+ LEedema--bulky dressing bilateral legs   Data Reviewed: I have personally reviewed following labs and imaging studies Basic Metabolic Panel: Recent Labs  Lab 10/25/20 1327 10/26/20 0530 10/27/20 0455  NA 137 138 137  K 4.2 4.1 4.1  CL 107 108 109  CO2 22 23 21*  GLUCOSE 151* 124* 85  BUN 47* 47* 48*  CREATININE 3.79* 3.80* 3.81*  CALCIUM 8.6* 8.6* 8.3*  MG  --  2.0  --    Liver Function Tests: Recent Labs  Lab 10/25/20 1327 10/26/20 0530  AST 23 20  ALT 26 22  ALKPHOS 131* 124  BILITOT 0.7 0.9  PROT 7.2 6.8  ALBUMIN 2.8* 2.6*   No results for input(s): LIPASE, AMYLASE in the last 168 hours. No results for input(s): AMMONIA in the last 168 hours. Coagulation Profile: No results for input(s): INR, PROTIME in the last 168 hours. CBC: Recent Labs  Lab 10/25/20 1327 10/26/20 0530 10/27/20 0455  WBC 7.4 7.2 6.8  NEUTROABS 6.0  --   --   HGB 8.9* 8.6* 8.5*  HCT 28.0* 27.0* 26.6*  MCV 85.1 86.0 85.0  PLT 188 171 186   Cardiac Enzymes: No results for input(s): CKTOTAL, CKMB, CKMBINDEX, TROPONINI in the last 168 hours. BNP: Invalid input(s): POCBNP CBG: Recent Labs  Lab 10/26/20 1613 10/26/20 2013  10/27/20 0720 10/27/20 1114 10/27/20 1612  GLUCAP 148* 163* 82 125* 163*   HbA1C: Recent Labs    10/26/20 0530  HGBA1C 7.6*   Urine analysis:    Component Value Date/Time   COLORURINE YELLOW 10/25/2020 1516   APPEARANCEUR CLEAR 10/25/2020 1516   APPEARANCEUR Clear 09/10/2020 1122   LABSPEC 1.010 10/25/2020 1516   PHURINE 6.0 10/25/2020 1516   GLUCOSEU 150 (A) 10/25/2020 1516   HGBUR SMALL (A) 10/25/2020 1516   BILIRUBINUR NEGATIVE 10/25/2020 1516   BILIRUBINUR Negative 09/10/2020 1122   KETONESUR NEGATIVE 10/25/2020 1516   PROTEINUR >=300 (A) 10/25/2020 1516   NITRITE NEGATIVE 10/25/2020 1516   LEUKOCYTESUR NEGATIVE 10/25/2020 1516   Sepsis Labs: _1 (procalcitonin:4,lacticidven:4) ) Recent Results (from the past 240 hour(s))  Aerobic Culture w Gram Stain (  superficial specimen)     Status: Abnormal   Collection Time: 10/25/20  3:29 PM   Specimen: Skin, Cyst/Tag/Debridement; Wound  Result Value Ref Range Status   Specimen Description   Final    SKIN Performed at [facility] [address], [place], [place] [address]    Special Requests   Final    NONE Performed at [facility], [address], [place], [place] [address]    Gram Stain   Final    RARE WBC PRESENT, PREDOMINANTLY PMN RARE GRAM POSITIVE COCCI IN PAIRS RARE GRAM NEGATIVE RODS Performed at [facility], [place] [address] [place], [place] [address]    Culture MULTIPLE ORGANISMS PRESENT, NONE PREDOMINANT (A)  Final   Report Status 10/27/2020 FINAL  Final  Culture, blood (routine x 2)     Status: None (Preliminary result)   Collection Time: 10/25/20  3:59 PM   Specimen: BLOOD  Result Value Ref Range Status   Specimen Description BLOOD LEFT ARM  Final   Special Requests   Final    BOTTLES DRAWN AEROBIC AND ANAEROBIC Blood Culture adequate volume   Culture   Final    NO GROWTH 2 DAYS Performed at [facility], [address] [place], [place] [address]    Report Status PENDING   Incomplete  Culture, blood (routine x 2)     Status: None (Preliminary result)   Collection Time: 10/25/20  3:59 PM   Specimen: BLOOD  Result Value Ref Range Status   Specimen Description BLOOD RIGHT ARM  Final   Special Requests   Final    BOTTLES DRAWN AEROBIC AND ANAEROBIC Blood Culture results may not be optimal due to an excessive volume of blood received in culture bottles   Culture   Final    NO GROWTH 2 DAYS Performed at [facility], [address] [place], [place] [address]    Report Status PENDING  Incomplete  SARS CORONAVIRUS 2 (Pansie Guggisberg 6-24 HRS) Nasopharyngeal Nasopharyngeal Swab     Status: None   Collection Time: 10/25/20  4:30 PM   Specimen: Nasopharyngeal Swab  Result Value Ref Range Status   SARS Coronavirus 2 NEGATIVE NEGATIVE Final    Comment: (NOTE) SARS-CoV-2 target nucleic acids are NOT DETECTED.  The SARS-CoV-2 RNA is generally detectable in upper and lower respiratory specimens during the acute phase of infection. Negative results do not preclude SARS-CoV-2 infection, do not rule out co-infections with other pathogens, and should not be used as the sole basis for treatment or other patient management decisions. Negative results must be combined with clinical observations, patient history, and epidemiological information. The expected result is Negative.  Fact Sheet for Patients: SugarRoll.be  Fact Sheet for Healthcare Providers: https://www.woods-mathews.com/  This test is not yet approved or cleared by the [place] FDA and  has been authorized for detection and/or diagnosis of SARS-CoV-2 by FDA under an Emergency Use Authorization (EUA). This EUA will remain  in effect (meaning this test can be used) for the duration of the COVID-19 declaration under Se ction 564(b)(1) of the Act, 21 U.S.C. section 360bbb-3(b)(1), unless the authorization is terminated or revoked sooner.  Performed at [place], [place] [address], [place], [place] [address]   Aerobic Culture w Gram Stain (superficial specimen)     Status: None (Preliminary result)   Collection Time: 10/25/20  5:10 PM   Specimen: Ankle; Wound  Result Value Ref Range Status   Specimen Description   Final  ANKLE Performed at [facility], [address], [place], [place] [address]    Special Requests   Final    NONE Performed at [facility], [address], [place], [place] [address]    Gram Stain   Final    RARE WBC PRESENT, PREDOMINANTLY PMN MODERATE GRAM POSITIVE COCCI IN PAIRS IN CLUSTERS MODERATE GRAM NEGATIVE RODS RARE GRAM POSITIVE RODS    Culture   Final    CULTURE REINCUBATED FOR BETTER GROWTH Performed at [facility], [place] [address], [place], [place] [address]    Report Status PENDING  Incomplete  Aerobic/Anaerobic Culture w Gram Stain (surgical/deep wound)     Status: None (Preliminary result)   Collection Time: 10/26/20  3:59 PM   Specimen: Tissue  Result Value Ref Range Status   Specimen Description TISSUE RIGHT ANKLE  Final   Special Requests NONE  Final   Gram Stain   Final    MODERATE WBC PRESENT,BOTH PMN AND MONONUCLEAR FEW SQUAMOUS EPITHELIAL CELLS PRESENT ABUNDANT GRAM NEGATIVE RODS MODERATE GRAM POSITIVE COCCI IN PAIRS MODERATE GRAM POSITIVE RODS    Culture   Final    TOO YOUNG TO READ Performed at [facility], [place] [address] [place], [place] [address]    Report Status PENDING  Incomplete     Scheduled Meds: . amLODipine  10 mg Oral Daily  . aspirin  325 mg Oral Daily  . cholecalciferol  1,000 Units Oral Daily  . collagenase   Topical Daily  . epoetin alfa-epbx  8,000 Units Subcutaneous Q14 Days  . feeding supplement (GLUCERNA SHAKE)  237 mL Oral TID BM  . heparin  5,000 Units Subcutaneous Q8H  . hydrALAZINE  100 mg Oral TID  . hydrocerin   Topical Daily  . insulin aspart  0-15 Units Subcutaneous TID WC  . insulin aspart  0-5 Units Subcutaneous QHS  . insulin detemir  20  Units Subcutaneous QHS  . linezolid  600 mg Oral Q12H  . metoprolol succinate  100 mg Oral QPM  . nystatin   Topical TID  . pravastatin  80 mg Oral QPM  . torsemide  20 mg Oral Daily  . vitamin C  1,000 mg Oral Daily   Continuous Infusions:  Procedures/Studies: DG Chest 2 View  Result Date: 10/25/2020 CLINICAL DATA:  CHF with leg swelling and ulcer to right lateral malleolus. EXAM: CHEST - 2 VIEW COMPARISON:  09/26/20 FINDINGS: There is mild cardiac enlargement, stable from previous exam. No pleural effusion or interstitial edema identified. No airspace densities. IMPRESSION: No acute findings.  No evidence for congestive heart failure. Electronically Signed   By: Kerby Moors M.D.   On: 10/25/2020 14:21   DG Ankle Complete Right  Result Date: 10/25/2020 CLINICAL DATA:  CHF, lower extremity swelling, lateral malleolar ulcer with concern for infection, no reported injury EXAM: RIGHT ANKLE - COMPLETE 3+ VIEW COMPARISON:  09/26/2020 right ankle radiographs FINDINGS: Diffuse right ankle soft tissue swelling. Small soft tissue defect at the lateral malleolus compatible with reported ulcer. Subtle loss of cortical line at the lateral distal margin of the lateral malleolus suspicious for early bony erosion. No fracture. No subluxation. Severe tibiotalar osteoarthritis. Intact staples at the lateral aspect of the calcaneus. Partially visualized intact screw at the base of fifth metatarsal. No focal osseous lesions. IMPRESSION: Diffuse right ankle soft tissue swelling. Small soft tissue defect at the lateral malleolus compatible with reported ulcer. Subtle loss of cortical line at the lateral distal margin of  the lateral malleolus, suspicious for early bony erosion due to osteomyelitis. Consider MRI of the right ankle without IV contrast for further evaluation as clinically warranted. Electronically Signed   By: Ilona Sorrel M.D.   On: 10/25/2020 14:24   MR ANKLE RIGHT WO CONTRAST  Result Date:  10/26/2020 CLINICAL DATA:  Lateral right ankle ulceration EXAM: MRI OF THE RIGHT ANKLE WITHOUT CONTRAST TECHNIQUE: Multiplanar, multisequence MR imaging of the ankle was performed. No intravenous contrast was administered. COMPARISON:  X-ray 10/25/2020 FINDINGS: TENDONS Peroneal: Tendinosis with longitudinal split tear of the infra-malleolar aspect of the peroneus brevis tendon. Intact peroneus longus tendon. No tenosynovitis. Posteromedial: Intact tibialis posterior, flexor hallucis longus and flexor digitorum longus tendons. Anterior: Intact tibialis anterior, extensor hallucis longus and extensor digitorum longus tendons. Achilles: Intact. Plantar Fascia: Intact. LIGAMENTS Lateral: Anterior talofibular ligament and calcaneofibular ligaments are not clearly seen, and likely chronically torn. Posterior talofibular ligament is heterogeneous but intact. Intact anterior and posterior tibiofibular ligaments. Medial: Deltoid ligament appears chronically torn. Portions of the spring ligament complex are ill-defined. CARTILAGE Ankle Joint: Severe tibiotalar osteoarthritis with full-thickness cartilage loss most pronounced anteriorly. No joint effusion. Prominent osseous densities along the posterior aspect of the tibiotalar and subtalar joints, largest measuring up to 2.4 cm. Subtalar Joints/Sinus Tarsi: Moderate arthropathy of the posterior subtalar joint. No effusion. Preservation of the anatomic fat within the sinus tarsi. Bones: No acute fracture or dislocation. No bony erosion or cortical destruction. No marrow edema. Well healed calcaneal osteotomy. Metallic susceptibility artifact from hardware within the fifth metatarsal. Other: Soft tissue swelling with superficial ulceration overlying the lateral malleolus. No organized fluid collection. Diffuse edema-like signal of the visualized foot and lower leg musculature. There is also fatty atrophy of the visualized musculature compatible with chronic denervation.  IMPRESSION: 1. Soft tissue swelling with superficial ulceration overlying the lateral malleolus. No organized fluid collection. No evidence of osteomyelitis. 2. Severe tibiotalar osteoarthritis with prominent osseous densities along the posterior aspect of the tibiotalar and subtalar joints, largest measuring up to 2.4 cm. 3. Fatty atrophy and edema throughout the visualized foot and lower leg musculature, likely representing a combination of denervation changes and/or myositis. Findings are in agreement with the preliminary report provided by Dr. Francoise Ceo. Preliminary report was called to the ordering provider Eustaquio Maize, PA at 5:54 p.m. on 10/25/2020 by Dr. Francoise Ceo. Electronically Signed   By: Davina Poke D.O.   On: 10/26/2020 08:32    Orson Eva, DO  [facility]  If 7PM-7AM, please contact night-coverage [email or web address] Password [email or web address] 10/27/2020, 5:06 PM   LOS: 0 days

## 2020-10-27 NOTE — Addendum Note (Signed)
Addended by: Tarri Abernethy on: 10/27/2020 05:41 PM   Modules accepted: Orders

## 2020-10-27 NOTE — Progress Notes (Signed)
[facility]  Wounds changed today by RN. Culture with growth.  Will follow.  Curlene Labrum, MD

## 2020-10-28 DIAGNOSIS — L988 Other specified disorders of the skin and subcutaneous tissue: Secondary | ICD-10-CM | POA: Diagnosis not present

## 2020-10-28 DIAGNOSIS — M869 Osteomyelitis, unspecified: Secondary | ICD-10-CM | POA: Diagnosis not present

## 2020-10-28 DIAGNOSIS — I878 Other specified disorders of veins: Secondary | ICD-10-CM | POA: Diagnosis not present

## 2020-10-28 DIAGNOSIS — L97319 Non-pressure chronic ulcer of right ankle with unspecified severity: Secondary | ICD-10-CM | POA: Diagnosis not present

## 2020-10-28 DIAGNOSIS — L89510 Pressure ulcer of right ankle, unstageable: Secondary | ICD-10-CM | POA: Diagnosis present

## 2020-10-28 DIAGNOSIS — E1122 Type 2 diabetes mellitus with diabetic chronic kidney disease: Secondary | ICD-10-CM | POA: Diagnosis not present

## 2020-10-28 DIAGNOSIS — N184 Chronic kidney disease, stage 4 (severe): Secondary | ICD-10-CM

## 2020-10-28 DIAGNOSIS — D649 Anemia, unspecified: Secondary | ICD-10-CM | POA: Diagnosis not present

## 2020-10-28 DIAGNOSIS — E1129 Type 2 diabetes mellitus with other diabetic kidney complication: Secondary | ICD-10-CM | POA: Diagnosis not present

## 2020-10-28 DIAGNOSIS — E785 Hyperlipidemia, unspecified: Secondary | ICD-10-CM | POA: Diagnosis present

## 2020-10-28 DIAGNOSIS — L03115 Cellulitis of right lower limb: Secondary | ICD-10-CM | POA: Diagnosis not present

## 2020-10-28 DIAGNOSIS — L0390 Cellulitis, unspecified: Secondary | ICD-10-CM | POA: Diagnosis not present

## 2020-10-28 DIAGNOSIS — I872 Venous insufficiency (chronic) (peripheral): Secondary | ICD-10-CM | POA: Diagnosis present

## 2020-10-28 DIAGNOSIS — Z9119 Patient's noncompliance with other medical treatment and regimen: Secondary | ICD-10-CM | POA: Diagnosis not present

## 2020-10-28 DIAGNOSIS — L97318 Non-pressure chronic ulcer of right ankle with other specified severity: Secondary | ICD-10-CM | POA: Diagnosis not present

## 2020-10-28 DIAGNOSIS — I130 Hypertensive heart and chronic kidney disease with heart failure and stage 1 through stage 4 chronic kidney disease, or unspecified chronic kidney disease: Secondary | ICD-10-CM | POA: Diagnosis not present

## 2020-10-28 DIAGNOSIS — E1169 Type 2 diabetes mellitus with other specified complication: Secondary | ICD-10-CM | POA: Diagnosis present

## 2020-10-28 DIAGNOSIS — Z20822 Contact with and (suspected) exposure to covid-19: Secondary | ICD-10-CM | POA: Diagnosis not present

## 2020-10-28 DIAGNOSIS — E11622 Type 2 diabetes mellitus with other skin ulcer: Secondary | ICD-10-CM | POA: Diagnosis not present

## 2020-10-28 DIAGNOSIS — M7989 Other specified soft tissue disorders: Secondary | ICD-10-CM | POA: Diagnosis not present

## 2020-10-28 DIAGNOSIS — N179 Acute kidney failure, unspecified: Secondary | ICD-10-CM | POA: Diagnosis not present

## 2020-10-28 DIAGNOSIS — M1A30X0 Chronic gout due to renal impairment, unspecified site, without tophus (tophi): Secondary | ICD-10-CM | POA: Diagnosis present

## 2020-10-28 DIAGNOSIS — Z993 Dependence on wheelchair: Secondary | ICD-10-CM | POA: Diagnosis not present

## 2020-10-28 DIAGNOSIS — N183 Chronic kidney disease, stage 3 unspecified: Secondary | ICD-10-CM | POA: Diagnosis not present

## 2020-10-28 DIAGNOSIS — D631 Anemia in chronic kidney disease: Secondary | ICD-10-CM | POA: Diagnosis present

## 2020-10-28 DIAGNOSIS — E871 Hypo-osmolality and hyponatremia: Secondary | ICD-10-CM | POA: Diagnosis not present

## 2020-10-28 DIAGNOSIS — E440 Moderate protein-calorie malnutrition: Secondary | ICD-10-CM | POA: Diagnosis not present

## 2020-10-28 DIAGNOSIS — Z6839 Body mass index (BMI) 39.0-39.9, adult: Secondary | ICD-10-CM | POA: Diagnosis not present

## 2020-10-28 DIAGNOSIS — M1990 Unspecified osteoarthritis, unspecified site: Secondary | ICD-10-CM | POA: Diagnosis present

## 2020-10-28 DIAGNOSIS — I5032 Chronic diastolic (congestive) heart failure: Secondary | ICD-10-CM | POA: Diagnosis not present

## 2020-10-28 DIAGNOSIS — L97519 Non-pressure chronic ulcer of other part of right foot with unspecified severity: Secondary | ICD-10-CM | POA: Diagnosis not present

## 2020-10-28 DIAGNOSIS — N2581 Secondary hyperparathyroidism of renal origin: Secondary | ICD-10-CM | POA: Diagnosis not present

## 2020-10-28 DIAGNOSIS — L03116 Cellulitis of left lower limb: Secondary | ICD-10-CM | POA: Diagnosis present

## 2020-10-28 DIAGNOSIS — I4891 Unspecified atrial fibrillation: Secondary | ICD-10-CM | POA: Diagnosis not present

## 2020-10-28 DIAGNOSIS — L03119 Cellulitis of unspecified part of limb: Secondary | ICD-10-CM | POA: Diagnosis present

## 2020-10-28 DIAGNOSIS — E1121 Type 2 diabetes mellitus with diabetic nephropathy: Secondary | ICD-10-CM | POA: Diagnosis not present

## 2020-10-28 DIAGNOSIS — I129 Hypertensive chronic kidney disease with stage 1 through stage 4 chronic kidney disease, or unspecified chronic kidney disease: Secondary | ICD-10-CM | POA: Diagnosis not present

## 2020-10-28 DIAGNOSIS — L97309 Non-pressure chronic ulcer of unspecified ankle with unspecified severity: Secondary | ICD-10-CM | POA: Diagnosis not present

## 2020-10-28 LAB — CBC
HCT: 26.6 % — ABNORMAL LOW (ref 39.0–52.0)
Hemoglobin: 8.6 g/dL — ABNORMAL LOW (ref 13.0–17.0)
MCH: 27.6 pg (ref 26.0–34.0)
MCHC: 32.3 g/dL (ref 30.0–36.0)
MCV: 85.3 fL (ref 80.0–100.0)
Platelets: 180 10*3/uL (ref 150–400)
RBC: 3.12 MIL/uL — ABNORMAL LOW (ref 4.22–5.81)
RDW: 17.1 % — ABNORMAL HIGH (ref 11.5–15.5)
WBC: 6.6 10*3/uL (ref 4.0–10.5)
nRBC: 0 % (ref 0.0–0.2)

## 2020-10-28 LAB — AEROBIC CULTURE W GRAM STAIN (SUPERFICIAL SPECIMEN)

## 2020-10-28 LAB — GLUCOSE, CAPILLARY
Glucose-Capillary: 76 mg/dL (ref 70–99)
Glucose-Capillary: 127 mg/dL — ABNORMAL HIGH (ref 70–99)
Glucose-Capillary: 140 mg/dL — ABNORMAL HIGH (ref 70–99)
Glucose-Capillary: 190 mg/dL — ABNORMAL HIGH (ref 70–99)
Glucose-Capillary: 159 mg/dL — ABNORMAL HIGH (ref 70–99)

## 2020-10-28 LAB — BASIC METABOLIC PANEL
Anion gap: 8 (ref 5–15)
BUN: 49 mg/dL — ABNORMAL HIGH (ref 8–23)
CO2: 22 mmol/L (ref 22–32)
Calcium: 8.5 mg/dL — ABNORMAL LOW (ref 8.9–10.3)
Chloride: 106 mmol/L (ref 98–111)
Creatinine, Ser: 3.93 mg/dL — ABNORMAL HIGH (ref 0.61–1.24)
GFR, Estimated: 16 mL/min — ABNORMAL LOW (ref >60)
Glucose, Bld: 82 mg/dL (ref 70–99)
Potassium: 4.2 mmol/L (ref 3.5–5.1)
Sodium: 136 mmol/L (ref 135–145)

## 2020-10-28 LAB — C-REACTIVE PROTEIN: CRP: 4.5 mg/dL — ABNORMAL HIGH (ref <1.0)

## 2020-10-28 NOTE — TOC Progression Note (Addendum)
Transition of Care [facility]) - Progression Note    Patient Details  Name: Danny [name] MRN: [id number] Date of Birth: 1956-05-12  [facility] CM/SW Contact  Boneta Lucks, RN Phone Number: 10/28/2020, 4:11 PM  Clinical Narrative:   TOC still working on trying to [employer] for Home health RN. [place] [facility] denied. Patient will benefit from home health and Out Patient wound care.  He and his sister are not talking now, so he has not help at home and no transportation. TOC scheduled Out patient wound care appointment at [facility] for May 10th at [place], added to AVS and wrote appointment on paper and took to patients room. [facility] is closed now, will leave handoff to schedule [facility] for patient. Also gave patient the number to call for transportation. TOC to follow.   Addendum : Orders faxed to outpatient  Expected Discharge Plan: [place] Barriers to Discharge: Continued Medical Work up  Expected Discharge Plan and Services Expected Discharge Plan: [place]

## 2020-10-28 NOTE — Progress Notes (Addendum)
PROGRESS NOTE  Danny [name] YDX:[id number] DOB: May 25, 1956 DOA: 10/25/2020 PCP: Celene Squibb, MD Brief History:  65 y/o with a history of hypertension, diabetes mellitus type 2, CKD stage III, psoriasis, venous stasis dermatitis, lymphedema presenting with 2-week history of his legs edema, pain, erythema and becoming weak.  He reports he was placed on a 2 week course of doxy in the beginning of April with some improvement, but his legs have worsened since finishing doxy 1-2 weeks prior to this admission.  He states he is WC bound and has not ambulated since July 2021.  He reports his legs have been draining yellow clear and cloudy fluid.  In the ED Temp 97.5, heart rate 81, respiratory rate 16-18, blood pressure 132/76, satting at 97% No leukocytosis with white blood cell count of 7.4, hemoglobin 8.9 Chemistry panel reveals an elevated BUN and creatinine that are close to his baseline at 47 and 3.79 Glucose is 151 UA is not indicative of UTI CRP is 3.4, sed rate is 69 COVID neg Blood cultures pending Official read of the MR ankle pending-ED provider reports the preliminary read was no concern for osteomyelitis EKG shows a heart rate of 97, QTc 477, PVCs Chest x-ray is negative Alk phos is elevated 131 Admission was requested to antibiotics and wound care to patient. Does not have reliable meansto follow-up outpatient  Assessment/Plan: 1. Bilateral lower extremity cellulitis-ankle open wound, chronic venous stasisdermatitis 1. Likely a component of cellulitis and a component of venous stasis contributing to the open wounds on patient's leg;  Failed outpatient antibiogics 2. Wound care consulted, also general surgery consulted for evaluation possible debridement.  Appreciate Dr. Pamelia Hoit taken.  Patient not interested in surgery or amputation 3. Patient was initiated on vancomycin, due to acute on chronic kidney disease, switch to linezolid initially.  4. Came to the ER  earlier in April and was started on doxycycline, reports taking all of it but then having no way to follow-up with any provider secondary to transportation barriers, now legs worsening 5. Vancomycin started in the ED.Marland KitchenMarland KitchenDiscontinued 10/26/2020 6. Blood cultures>>>>neg to date 7. MRI ankle --no abscess, no osteomyelitis.  Fatty atrophy of visualized musculature = chronic devervation 8. CRP 3.4, ESR 69 9. D/c zyvox.  Start cefazolin and doxy 5/4 10. Appreciate general surgery consult-->bx and culture done 5/3 11. SW/TOC assisting with setting up transport with [facility] and appointment with outpatient wound care clinic  2. Fall and generalized weakness 1. Mechanical fall no LOC, did not hit head 2. Consult PT-->SNF-->pt refuses  3. DMII-uncontrolled 1. CBG QA CHS, SSI coverage, 2. Takes 70/30 20-100 unitsat home 3. Continue Lantus 20 units 4. A1c:7.6 5. Carb modified diet 4. Moderate protein cal malnutrition 1. Albumin 2.8 2. Counseled on the importance of nutrient dense diet 3. Glucerna shakes 4. Continue to monitor 5. Medical non compliance 1. TOC consulted for possible PT at home?Any assistance with transportation for medical appointments? 6.CKD 4 -Trend BUN/creatinine closely, -baseline creatinine 3.7-4.0 7.  Class 2 Obesity -BMI 36.91 -lifestyle modification      Status is: inpatient  The patient will require care spanning > 2 midnights and should be moved to inpatient because: IV treatments appropriate due to intensity of illness or inability to take PO  Dispo: The patient is from: Home  Anticipated d/c is to: Home  Patient currently is not medically stable to d/c.  Difficult to place patient no        Family Communication:  no Family at bedside  Consultants:  none  Code Status:  FULL  DVT Prophylaxis:  [place] Heparin    Procedures: As Listed in Progress Note Above  Antibiotics: zyvox  5/3>>5/4 Cefazolin 5/4>> Doxy 5/4>>  Subjective: Patient denies fevers, chills, headache, chest pain, dyspnea, nausea, vomiting, diarrhea, abdominal pain, dysuria, hematuria,    Objective: Vitals:   10/28/20 0327 10/28/20 0500 10/28/20 1205 10/28/20 1257  BP: 140/70  130/78 119/70  Pulse: 82  83 83  Resp: 20  20 20   Temp: 98.2 F (36.8 C)   98 F (36.7 C)  TempSrc:    Oral  SpO2: 95%  97% 99%  Weight:  126.9 kg    Height:        Intake/Output Summary (Last 24 hours) at 10/28/2020 1509 Last data filed at 10/28/2020 1255 Gross per 24 hour  Intake 700 ml  Output 1050 ml  Net -350 ml   Weight change: 0.1 kg Exam:   General:  Pt is alert, follows commands appropriately, not in acute distress  HEENT: No icterus, No thrush, No neck mass, [place]/AT  Cardiovascular: RRR, S1/S2, no rubs, no gallops  Respiratory: CTA bilaterally, no wheezing, no crackles, no rhonchi  Abdomen: Soft/+BS, non tender, non distended, no guarding  Extremities: 1+LE edema, bulky dressing bilateral   Data Reviewed: I have personally reviewed following labs and imaging studies Basic Metabolic Panel: Recent Labs  Lab 10/25/20 1327 10/26/20 0530 10/27/20 0455 10/28/20 0542  NA 137 138 137 136  K 4.2 4.1 4.1 4.2  CL 107 108 109 106  CO2 22 23 21* 22  GLUCOSE 151* 124* 85 82  BUN 47* 47* 48* 49*  CREATININE 3.79* 3.80* 3.81* 3.93*  CALCIUM 8.6* 8.6* 8.3* 8.5*  MG  --  2.0  --   --    Liver Function Tests: Recent Labs  Lab 10/25/20 1327 10/26/20 0530  AST 23 20  ALT 26 22  ALKPHOS 131* 124  BILITOT 0.7 0.9  PROT 7.2 6.8  ALBUMIN 2.8* 2.6*   No results for input(s): LIPASE, AMYLASE in the last 168 hours. No results for input(s): AMMONIA in the last 168 hours. Coagulation Profile: No results for input(s): INR, PROTIME in the last 168 hours. CBC: Recent Labs  Lab 10/25/20 1327 10/26/20 0530 10/27/20 0455 10/28/20 0542  WBC 7.4 7.2 6.8 6.6  NEUTROABS 6.0  --   --   --   HGB 8.9*  8.6* 8.5* 8.6*  HCT 28.0* 27.0* 26.6* 26.6*  MCV 85.1 86.0 85.0 85.3  PLT 188 171 186 180   Cardiac Enzymes: No results for input(s): CKTOTAL, CKMB, CKMBINDEX, TROPONINI in the last 168 hours. BNP: Invalid input(s): POCBNP CBG: Recent Labs  Lab 10/27/20 1612 10/27/20 2134 10/28/20 0800 10/28/20 1108 10/28/20 1207  GLUCAP 163* 136* 76 127* 140*   HbA1C: Recent Labs    10/26/20 0530  HGBA1C 7.6*   Urine analysis:    Component Value Date/Time   COLORURINE YELLOW 10/25/2020 1516   APPEARANCEUR CLEAR 10/25/2020 1516   APPEARANCEUR Clear 09/10/2020 1122   LABSPEC 1.010 10/25/2020 1516   PHURINE 6.0 10/25/2020 1516   GLUCOSEU 150 (A) 10/25/2020 1516   HGBUR SMALL (A) 10/25/2020 1516   BILIRUBINUR NEGATIVE 10/25/2020 1516   BILIRUBINUR Negative 09/10/2020 [place] 10/25/2020 1516   PROTEINUR >=300 (A) 10/25/2020 1516   NITRITE NEGATIVE 10/25/2020 1516   LEUKOCYTESUR NEGATIVE  10/25/2020 1516   Sepsis Labs: @LABRCNTIP (procalcitonin:4,lacticidven:4) ) Recent Results (from the past 240 hour(s))  Aerobic Culture w Gram Stain (superficial specimen)     Status: Abnormal   Collection Time: 10/25/20  3:29 PM   Specimen: Skin, Cyst/Tag/Debridement; Wound  Result Value Ref Range Status   Specimen Description   Final    SKIN Performed at [facility] [address] [place], [place] [address]    Special Requests   Final    NONE Performed at [facility], [address] [place], [place] [address]    Gram Stain   Final    RARE WBC PRESENT, PREDOMINANTLY PMN RARE GRAM POSITIVE COCCI IN PAIRS RARE GRAM NEGATIVE RODS Performed at [facility], [place] [address], [place], [place] [address]    Culture MULTIPLE ORGANISMS PRESENT, NONE PREDOMINANT (A)  Final   Report Status 10/27/2020 FINAL  Final  Culture, blood (routine x 2)     Status: None (Preliminary result)   Collection Time: 10/25/20  3:59 PM   Specimen: BLOOD  Result Value Ref Range Status    Specimen Description BLOOD LEFT ARM  Final   Special Requests   Final    BOTTLES DRAWN AEROBIC AND ANAEROBIC Blood Culture adequate volume   Culture   Final    NO GROWTH 2 DAYS Performed at [facility], [address] [place], [place] [address]    Report Status PENDING  Incomplete  Culture, blood (routine x 2)     Status: None (Preliminary result)   Collection Time: 10/25/20  3:59 PM   Specimen: BLOOD  Result Value Ref Range Status   Specimen Description BLOOD RIGHT ARM  Final   Special Requests   Final    BOTTLES DRAWN AEROBIC AND ANAEROBIC Blood Culture results may not be optimal due to an excessive volume of blood received in culture bottles   Culture   Final    NO GROWTH 2 DAYS Performed at [facility], [address], [place], [place] [address]    Report Status PENDING  Incomplete  SARS CORONAVIRUS 2 (Kalven Ganim 6-24 HRS) Nasopharyngeal Nasopharyngeal Swab     Status: None   Collection Time: 10/25/20  4:30 PM   Specimen: Nasopharyngeal Swab  Result Value Ref Range Status   SARS Coronavirus 2 NEGATIVE NEGATIVE Final    Comment: (NOTE) SARS-CoV-2 target nucleic acids are NOT DETECTED.  The SARS-CoV-2 RNA is generally detectable in upper and lower respiratory specimens during the acute phase of infection. Negative results do not preclude SARS-CoV-2 infection, do not rule out co-infections with other pathogens, and should not be used as the sole basis for treatment or other patient management decisions. Negative results must be combined with clinical observations, patient history, and epidemiological information. The expected result is Negative.  Fact Sheet for Patients: SugarRoll.be  Fact Sheet for Healthcare Providers: https://www.woods-mathews.com/  This test is not yet approved or cleared by the [place] FDA and  has been authorized for detection and/or diagnosis of SARS-CoV-2 by FDA under an Emergency Use Authorization  (EUA). This EUA will remain  in effect (meaning this test can be used) for the duration of the COVID-19 declaration under Se ction 564(b)(1) of the Act, 21 U.S.C. section 360bbb-3(b)(1), unless the authorization is terminated or revoked sooner.  Performed at [facility], [place] [address] [place], [place] [address]   Aerobic Culture w Gram Stain (superficial specimen)     Status: Abnormal   Collection Time: 10/25/20  5:10 PM  Specimen: Ankle; Wound  Result Value Ref Range Status   Specimen Description   Final    ANKLE Performed at [facility], [address], [place], [place] [address]    Special Requests   Final    NONE Performed at [place] [place], [place] [address]    Gram Stain   Final    RARE WBC PRESENT, PREDOMINANTLY PMN MODERATE GRAM POSITIVE COCCI IN PAIRS IN CLUSTERS MODERATE GRAM NEGATIVE RODS RARE GRAM POSITIVE RODS Performed at [facility], [place] [address] [place], [place] [address]    Culture MULTIPLE ORGANISMS PRESENT, NONE PREDOMINANT (A)  Final   Report Status 10/28/2020 FINAL  Final  Aerobic/Anaerobic Culture w Gram Stain (surgical/deep wound)     Status: None (Preliminary result)   Collection Time: 10/26/20  3:59 PM   Specimen: Tissue  Result Value Ref Range Status   Specimen Description TISSUE RIGHT ANKLE  Final   Special Requests NONE  Final   Gram Stain   Final    MODERATE WBC PRESENT,BOTH PMN AND MONONUCLEAR FEW SQUAMOUS EPITHELIAL CELLS PRESENT ABUNDANT GRAM NEGATIVE RODS MODERATE GRAM POSITIVE COCCI IN PAIRS MODERATE GRAM POSITIVE RODS Performed at [facility], [place] [address] [place], [place] [address]    Culture   Final    CULTURE REINCUBATED FOR BETTER GROWTH NO ANAEROBES ISOLATED; CULTURE IN PROGRESS FOR 5 DAYS    Report Status PENDING  Incomplete     Scheduled Meds: . amLODipine  10 mg Oral Daily  . aspirin  325 mg Oral Daily  . cholecalciferol  1,000 Units Oral Daily  . collagenase   Topical  Daily  . doxycycline  100 mg Oral Q12H  . epoetin alfa-epbx  8,000 Units Subcutaneous Q14 Days  . feeding supplement (GLUCERNA SHAKE)  237 mL Oral TID BM  . heparin  5,000 Units Subcutaneous Q8H  . hydrALAZINE  100 mg Oral TID  . hydrocerin   Topical Daily  . insulin aspart  0-15 Units Subcutaneous TID WC  . insulin aspart  0-5 Units Subcutaneous QHS  . insulin detemir  20 Units Subcutaneous QHS  . metoprolol succinate  100 mg Oral QPM  . nystatin   Topical TID  . pravastatin  80 mg Oral QPM  . torsemide  20 mg Oral Daily  . vitamin C  1,000 mg Oral Daily   Continuous Infusions: .  ceFAZolin (ANCEF) IV 1 g (10/28/20 1017)    Procedures/Studies: DG Chest 2 View  Result Date: 10/25/2020 CLINICAL DATA:  CHF with leg swelling and ulcer to right lateral malleolus. EXAM: CHEST - 2 VIEW COMPARISON:  09/26/20 FINDINGS: There is mild cardiac enlargement, stable from previous exam. No pleural effusion or interstitial edema identified. No airspace densities. IMPRESSION: No acute findings.  No evidence for congestive heart failure. Electronically Signed   By: Kerby Moors M.D.   On: 10/25/2020 14:21   DG Ankle Complete Right  Result Date: 10/25/2020 CLINICAL DATA:  CHF, lower extremity swelling, lateral malleolar ulcer with concern for infection, no reported injury EXAM: RIGHT ANKLE - COMPLETE 3+ VIEW COMPARISON:  09/26/2020 right ankle radiographs FINDINGS: Diffuse right ankle soft tissue swelling. Small soft tissue defect at the lateral malleolus compatible with reported ulcer. Subtle loss of cortical line at the lateral distal margin of the lateral malleolus suspicious for early bony erosion. No fracture. No subluxation. Severe tibiotalar osteoarthritis. Intact staples at the lateral aspect of the calcaneus. Partially visualized intact screw at the base  of fifth metatarsal. No focal osseous lesions. IMPRESSION: Diffuse right ankle soft tissue swelling. Small soft tissue defect at the lateral  malleolus compatible with reported ulcer. Subtle loss of cortical line at the lateral distal margin of the lateral malleolus, suspicious for early bony erosion due to osteomyelitis. Consider MRI of the right ankle without IV contrast for further evaluation as clinically warranted. Electronically Signed   By: Ilona Sorrel M.D.   On: 10/25/2020 14:24   MR ANKLE RIGHT WO CONTRAST  Result Date: 10/26/2020 CLINICAL DATA:  Lateral right ankle ulceration EXAM: MRI OF THE RIGHT ANKLE WITHOUT CONTRAST TECHNIQUE: Multiplanar, multisequence MR imaging of the ankle was performed. No intravenous contrast was administered. COMPARISON:  X-ray 10/25/2020 FINDINGS: TENDONS Peroneal: Tendinosis with longitudinal split tear of the infra-malleolar aspect of the peroneus brevis tendon. Intact peroneus longus tendon. No tenosynovitis. Posteromedial: Intact tibialis posterior, flexor hallucis longus and flexor digitorum longus tendons. Anterior: Intact tibialis anterior, extensor hallucis longus and extensor digitorum longus tendons. Achilles: Intact. Plantar Fascia: Intact. LIGAMENTS Lateral: Anterior talofibular ligament and calcaneofibular ligaments are not clearly seen, and likely chronically torn. Posterior talofibular ligament is heterogeneous but intact. Intact anterior and posterior tibiofibular ligaments. Medial: Deltoid ligament appears chronically torn. Portions of the spring ligament complex are ill-defined. CARTILAGE Ankle Joint: Severe tibiotalar osteoarthritis with full-thickness cartilage loss most pronounced anteriorly. No joint effusion. Prominent osseous densities along the posterior aspect of the tibiotalar and subtalar joints, largest measuring up to 2.4 cm. Subtalar Joints/Sinus Tarsi: Moderate arthropathy of the posterior subtalar joint. No effusion. Preservation of the anatomic fat within the sinus tarsi. Bones: No acute fracture or dislocation. No bony erosion or cortical destruction. No marrow edema. Well  healed calcaneal osteotomy. Metallic susceptibility artifact from hardware within the fifth metatarsal. Other: Soft tissue swelling with superficial ulceration overlying the lateral malleolus. No organized fluid collection. Diffuse edema-like signal of the visualized foot and lower leg musculature. There is also fatty atrophy of the visualized musculature compatible with chronic denervation. IMPRESSION: 1. Soft tissue swelling with superficial ulceration overlying the lateral malleolus. No organized fluid collection. No evidence of osteomyelitis. 2. Severe tibiotalar osteoarthritis with prominent osseous densities along the posterior aspect of the tibiotalar and subtalar joints, largest measuring up to 2.4 cm. 3. Fatty atrophy and edema throughout the visualized foot and lower leg musculature, likely representing a combination of denervation changes and/or myositis. Findings are in agreement with the preliminary report provided by Dr. Francoise Ceo. Preliminary report was called to the ordering provider Eustaquio Maize, PA at 5:54 p.m. on 10/25/2020 by Dr. Francoise Ceo. Electronically Signed   By: Davina Poke D.O.   On: 10/26/2020 08:32    Orson Eva, DO  [facility]  If 7PM-7AM, please contact night-coverage [email or web address] Password [email or web address] 10/28/2020, 3:09 PM   LOS: 0 days

## 2020-10-28 NOTE — Progress Notes (Addendum)
[facility]  No real options for dressing changes at home. Definitely at risk for amputation in the risk given wounds, arthritis/ foot rotation/ deformity.   Would recommend wound care with the outpatient wound team and referral to Dr. Sharol Given for additional recommendations in the longterm regarding his foot.  Biopsy pending. Will call with results if he is discharged.   Curlene Labrum, MD [facility] [address] [place], [place] [address] [phone] (office)

## 2020-10-28 NOTE — Progress Notes (Signed)
Wound care completed on bilateral legs. Wounds cleaned with soap and water. Eucerin cream used per order. Pt tolerated well.

## 2020-10-29 DIAGNOSIS — L03119 Cellulitis of unspecified part of limb: Secondary | ICD-10-CM | POA: Diagnosis not present

## 2020-10-29 DIAGNOSIS — E11622 Type 2 diabetes mellitus with other skin ulcer: Secondary | ICD-10-CM | POA: Diagnosis not present

## 2020-10-29 DIAGNOSIS — Z9119 Patient's noncompliance with other medical treatment and regimen: Secondary | ICD-10-CM | POA: Diagnosis not present

## 2020-10-29 DIAGNOSIS — N184 Chronic kidney disease, stage 4 (severe): Secondary | ICD-10-CM | POA: Diagnosis not present

## 2020-10-29 DIAGNOSIS — I878 Other specified disorders of veins: Secondary | ICD-10-CM

## 2020-10-29 LAB — CBC
HCT: 26.1 % — ABNORMAL LOW (ref 39.0–52.0)
Hemoglobin: 8.3 g/dL — ABNORMAL LOW (ref 13.0–17.0)
MCH: 27.5 pg (ref 26.0–34.0)
MCHC: 31.8 g/dL (ref 30.0–36.0)
MCV: 86.4 fL (ref 80.0–100.0)
Platelets: 174 10*3/uL (ref 150–400)
RBC: 3.02 MIL/uL — ABNORMAL LOW (ref 4.22–5.81)
RDW: 17.2 % — ABNORMAL HIGH (ref 11.5–15.5)
WBC: 7.1 10*3/uL (ref 4.0–10.5)
nRBC: 0 % (ref 0.0–0.2)

## 2020-10-29 LAB — BASIC METABOLIC PANEL
Anion gap: 6 (ref 5–15)
BUN: 52 mg/dL — ABNORMAL HIGH (ref 8–23)
CO2: 21 mmol/L — ABNORMAL LOW (ref 22–32)
Calcium: 8.3 mg/dL — ABNORMAL LOW (ref 8.9–10.3)
Chloride: 109 mmol/L (ref 98–111)
Creatinine, Ser: 4.29 mg/dL — ABNORMAL HIGH (ref 0.61–1.24)
GFR, Estimated: 14 mL/min — ABNORMAL LOW (ref >60)
Glucose, Bld: 106 mg/dL — ABNORMAL HIGH (ref 70–99)
Potassium: 4.7 mmol/L (ref 3.5–5.1)
Sodium: 136 mmol/L (ref 135–145)

## 2020-10-29 LAB — GLUCOSE, CAPILLARY
Glucose-Capillary: 95 mg/dL (ref 70–99)
Glucose-Capillary: 153 mg/dL — ABNORMAL HIGH (ref 70–99)
Glucose-Capillary: 146 mg/dL — ABNORMAL HIGH (ref 70–99)
Glucose-Capillary: 136 mg/dL — ABNORMAL HIGH (ref 70–99)

## 2020-10-29 LAB — SURGICAL PATHOLOGY

## 2020-10-29 MED ORDER — ALUM & MAG HYDROXIDE-SIMETH 200-200-20 MG/5ML PO SUSP
30.0000 mL | Freq: Once | ORAL | Status: AC
  Administered 2020-10-29: 30 mL via ORAL
  Filled 2020-10-29: qty 30

## 2020-10-29 MED ORDER — LIDOCAINE VISCOUS HCL 2 % MT SOLN: 15.0000 mL | Freq: Once | OROMUCOSAL | Status: DC

## 2020-10-29 NOTE — Progress Notes (Addendum)
PROGRESS NOTE  Danny [name] IYM:[id number] DOB: 1955/11/11 DOA: 10/25/2020 PCP: Celene Squibb, MD  Brief History: 47 y/owith a history of hypertension, diabetes mellitus type 2, CKD stage III, psoriasis, venous stasis dermatitis, lymphedema presenting with 2-weekhistory of his legs edema, pain, erythema and becoming weak. He reports he was placed on a 2 week course of doxy in the beginning of April with some improvement, but his legs have worsened since finishing doxy 1-2 weeks prior to this admission. He states he is WC bound and has not ambulated since July 2021. He reports his legs have been draining yellow clear and cloudy fluid.  In the ED Temp 97.5, heart rate 81, respiratory rate 16-18, blood pressure 132/76, satting at 97% No leukocytosis with white blood cell count of 7.4, hemoglobin 8.9 Chemistry panel reveals an elevated BUN and creatinine that are close to his baseline at 47 and 3.79 Glucose is 151 UA is not indicative of UTI CRP is 3.4, sed rate is 69 COVIDneg Blood cultures pending Official read of the MR ankle pending-ED provider reports the preliminary read was no concern for osteomyelitis EKG shows a heart rate of 97, QTc 477, PVCs Chest x-ray is negative Alk phos is elevated 131 Admission was requested to antibiotics and wound care to patient. Does not have reliable meansto follow-up outpatient  Assessment/Plan: Bilateral lower extremity cellulitis-ankle open wound, chronic venous stasisdermatitis 1. Likely a component of cellulitis and a component of venous stasis contributing to the open wounds on patient's leg; Failed outpatient antibiogics 2. Wound care consulted, also general surgery consulted for evaluation possible debridement. Appreciate Dr. Pamelia Hoit taken. Patient not interested in surgery or amputation 3. Patient was initiated on vancomycin, due to acute on chronic kidney disease, switch to linezolidinitially. 4. Came to the ER  earlier in April and was started on doxycycline, reports taking all of it but then having no way to follow-up with any provider secondary to transportation barriers, now legs worsening 5. Vancomycin started in the ED.Marland KitchenMarland KitchenDiscontinued 10/26/2020 6. Blood cultures>>>>neg to date 7. MRI ankle--no abscess, no osteomyelitis. Fatty atrophy of visualized musculature = chronic devervation 8. CRP 3.4, ESR 69 9. D/c zyvox. Start cefazolin and doxy 5/4 10. Appreciate general surgery consult-->bx and culture done 5/3 11. SW/TOC assisting with setting up transport with [facility] and appointment with outpatient wound care clinic 12. 10/29/20--I feel most changes noted in photos below are attributable to venous stasis dermatitis  New Onset Atrial fibrillation -personally reviewed EKG--Afib, nonspecific T wave change -rate controlled -CHADS-VASc = 5 (CHF, HTN, Age, DM, ASVD) -continue metoprolol succinate -start apixaban  Fall and Generalized weakness 1. Mechanical fall no LOC, did not hit head 2. Consult PT-->SNF-->pt refuses  DMII-uncontrolled with hyperglycemia 1. CBG QA CHS, SSI coverage, 2. Takes 70/30 20-100 unitsat home 3. Continue Lantus 20 units 4. A1c:7.6 5. Carb modified diet Moderate protein cal malnutrition 1. Albumin 2.8 2. Counseled on the importance of nutrient dense diet 3. Glucerna shakes 4. Continue to monitor Medical non compliance 1. TOC consulted for possible PT at home?Any assistance with transportation for medical appointments? CKD 4 -Trend BUN/creatinine closely, -baseline creatinine 3.7-4.0  Class 2 Obesity -BMI 36.91 -lifestyle modification      Status is: inpatient  The patient will require care spanning > 2 midnights and should be moved to inpatient because:IV treatments appropriate due to intensity of illness or inability to take PO  Dispo: The patient is from:Home Anticipated d/c is HW:KGSU Patient  currently is  not medically stable to d/c. Difficult to place patient no        Family Communication:noFamily at bedside  Consultants:none  Code Status: FULL  DVT Prophylaxis: [place] Heparin    Procedures: As Listed in Progress Note Above  Antibiotics: zyvox 5/3>>5/4 Cefazolin 5/4>> Doxy 5/4>>     Subjective Patient denies fevers, chills, headache, chest pain, dyspnea, nausea, vomiting, diarrhea, abdominal pain, dysuria, hematuria,    Objective: Vitals:   10/28/20 1205 10/28/20 1257 10/29/20 0521 10/29/20 1434  BP: 130/78 119/70 119/74 117/75  Pulse: 83 83 100 93  Resp: 20 20 18 20   Temp:  98 F (36.7 C) 98.2 F (36.8 C) 97.9 F (36.6 C)  TempSrc:  Oral Oral Oral  SpO2: 97% 99% 94% 91%  Weight:      Height:        Intake/Output Summary (Last 24 hours) at 10/29/2020 1554 Last data filed at 10/29/2020 0900 Gross per 24 hour  Intake 440 ml  Output 700 ml  Net -260 ml   Weight change:  Exam:   General:  Pt is alert, follows commands appropriately, not in acute distress  HEENT: No icterus, No thrush, No neck mass, [place]/AT  Cardiovascular: RRR, S1/S2, no rubs, no gallops  Respiratory: CTA bilaterally, no wheezing, no crackles, no rhonchi  Abdomen: Soft/+BS, non tender, non distended, no guarding  Extremities:1+LE edema with mostly nonblanchable erythema below knee       Data Reviewed: I have personally reviewed following labs and imaging studies Basic Metabolic Panel: Recent Labs  Lab 10/25/20 1327 10/26/20 0530 10/27/20 0455 10/28/20 0542 10/29/20 0606  NA 137 138 137 136 136  K 4.2 4.1 4.1 4.2 4.7  CL 107 108 109 106 109  CO2 22 23 21* 22 21*  GLUCOSE 151* 124* 85 82 106*  BUN 47* 47* 48* 49* 52*  CREATININE 3.79* 3.80* 3.81* 3.93* 4.29*  CALCIUM 8.6* 8.6* 8.3* 8.5* 8.3*  MG  --  2.0  --   --   --    Liver Function Tests: Recent Labs    Lab 10/25/20 1327 10/26/20 0530  AST 23 20  ALT 26 22  ALKPHOS 131* 124   BILITOT 0.7 0.9  PROT 7.2 6.8  ALBUMIN 2.8* 2.6*   No results for input(s): LIPASE, AMYLASE in the last 168 hours. No results for input(s): AMMONIA in the last 168 hours. Coagulation Profile: No results for input(s): INR, PROTIME in the last 168 hours. CBC: Recent Labs  Lab 10/25/20 1327 10/26/20 0530 10/27/20 0455 10/28/20 0542 10/29/20 0606  WBC 7.4 7.2 6.8 6.6 7.1  NEUTROABS 6.0  --   --   --   --   HGB 8.9* 8.6* 8.5* 8.6* 8.3*  HCT 28.0* 27.0* 26.6* 26.6* 26.1*  MCV 85.1 86.0 85.0 85.3 86.4  PLT 188 171 186 180 174   Cardiac Enzymes: No results for input(s): CKTOTAL, CKMB, CKMBINDEX, TROPONINI in the last 168 hours. BNP: Invalid input(s): POCBNP CBG: Recent Labs  Lab 10/28/20 1207 10/28/20 1639 10/28/20 2031 10/29/20 0745 10/29/20 1117  GLUCAP 140* 190* 159* 95 153*   HbA1C: No results for input(s): HGBA1C in the last 72 hours. Urine analysis:    Component Value Date/Time   COLORURINE YELLOW 10/25/2020 [place] 10/25/2020 1516   APPEARANCEUR Clear 09/10/2020 1122   LABSPEC 1.010 10/25/2020 1516   PHURINE 6.0 10/25/2020 1516   GLUCOSEU 150 (A) 10/25/2020 1516   HGBUR SMALL (A) 10/25/2020 1516  BILIRUBINUR NEGATIVE 10/25/2020 1516   BILIRUBINUR Negative 09/10/2020 1122   KETONESUR NEGATIVE 10/25/2020 1516   PROTEINUR >=300 (A) 10/25/2020 1516   NITRITE NEGATIVE 10/25/2020 1516   LEUKOCYTESUR NEGATIVE 10/25/2020 1516   Sepsis Labs: @LABRCNTIP (procalcitonin:4,lacticidven:4) ) Recent Results (from the past 240 hour(s))  Aerobic Culture w Gram Stain (superficial specimen)     Status: Abnormal   Collection Time: 10/25/20  3:29 PM   Specimen: Skin, Cyst/Tag/Debridement; Wound  Result Value Ref Range Status   Specimen Description   Final    SKIN Performed at [place] [address] [place], [place] [address]    Special Requests   Final    NONE Performed at [facility], [address], [place], [place] [address]    Gram  Stain   Final    RARE WBC PRESENT, PREDOMINANTLY PMN RARE GRAM POSITIVE COCCI IN PAIRS RARE GRAM NEGATIVE RODS Performed at [facility], [place] [address] [place], [place] [address]    Culture MULTIPLE ORGANISMS PRESENT, NONE PREDOMINANT (A)  Final   Report Status 10/27/2020 FINAL  Final  Culture, blood (routine x 2)     Status: None (Preliminary result)   Collection Time: 10/25/20  3:59 PM   Specimen: BLOOD  Result Value Ref Range Status   Specimen Description BLOOD LEFT ARM  Final   Special Requests   Final    BOTTLES DRAWN AEROBIC AND ANAEROBIC Blood Culture adequate volume   Culture   Final    NO GROWTH 4 DAYS Performed at [facility], [address], [place], [place] [address]    Report Status PENDING  Incomplete  Culture, blood (routine x 2)     Status: None (Preliminary result)   Collection Time: 10/25/20  3:59 PM   Specimen: BLOOD  Result Value Ref Range Status   Specimen Description BLOOD RIGHT ARM  Final   Special Requests   Final    BOTTLES DRAWN AEROBIC AND ANAEROBIC Blood Culture results may not be optimal due to an excessive volume of blood received in culture bottles   Culture   Final    NO GROWTH 4 DAYS Performed at [facility], [address], [place], [place] [address]    Report Status PENDING  Incomplete  SARS CORONAVIRUS 2 (Mubashir Mallek 6-24 HRS) Nasopharyngeal Nasopharyngeal Swab     Status: None   Collection Time: 10/25/20  4:30 PM   Specimen: Nasopharyngeal Swab  Result Value Ref Range Status   SARS Coronavirus 2 NEGATIVE NEGATIVE Final    Comment: (NOTE) SARS-CoV-2 target nucleic acids are NOT DETECTED.  The SARS-CoV-2 RNA is generally detectable in upper and lower respiratory specimens during the acute phase of infection. Negative results do not preclude SARS-CoV-2 infection, do not rule out co-infections with other pathogens, and should not be used as the sole basis for treatment or other patient management decisions. Negative results must be  combined with clinical observations, patient history, and epidemiological information. The expected result is Negative.  Fact Sheet for Patients: SugarRoll.be  Fact Sheet for Healthcare Providers: https://www.woods-mathews.com/  This test is not yet approved or cleared by the [place] FDA and  has been authorized for detection and/or diagnosis of SARS-CoV-2 by FDA under an Emergency Use Authorization (EUA). This EUA will remain  in effect (meaning this test can be used) for the duration of the COVID-19 declaration under Se ction 564(b)(1) of the Act, 21 U.S.C. section 360bbb-3(b)(1), unless the authorization is terminated or revoked sooner.  Performed at [facility], [place] [address], [place], [place] [address]   Aerobic Culture w Gram Stain (superficial specimen)     Status: Abnormal   Collection Time: 10/25/20  5:10 PM   Specimen: Ankle; Wound  Result Value Ref Range Status   Specimen Description   Final    ANKLE Performed at [facility], [address], [place], [place] [address]    Special Requests   Final    NONE Performed at [place] [place], [place] [address]    Gram Stain   Final    RARE WBC PRESENT, PREDOMINANTLY PMN MODERATE GRAM POSITIVE COCCI IN PAIRS IN CLUSTERS MODERATE GRAM NEGATIVE RODS RARE GRAM POSITIVE RODS Performed at [facility], [place] [address] [place], [place] [address]    Culture MULTIPLE ORGANISMS PRESENT, NONE PREDOMINANT (A)  Final   Report Status 10/28/2020 FINAL  Final  Fungus Culture With Stain     Status: None (Preliminary result)   Collection Time: 10/26/20  3:59 PM   Specimen: Ankle  Result Value Ref Range Status   Fungus Stain Final report  Final    Comment: (NOTE) Performed At: [facility] [place], [place] [id number] Rush Farmer MD [id number]    Fungus (Mycology) Culture PENDING  Incomplete   Fungal Source ANKLE  Final     Comment: WOUND Performed at [facility], [address] [place], [place] [address]   Aerobic/Anaerobic Culture w Gram Stain (surgical/deep wound)     Status: Abnormal (Preliminary result)   Collection Time: 10/26/20  3:59 PM   Specimen: Tissue  Result Value Ref Range Status   Specimen Description TISSUE RIGHT ANKLE  Final   Special Requests NONE  Final   Gram Stain   Final    MODERATE WBC PRESENT,BOTH PMN AND MONONUCLEAR FEW SQUAMOUS EPITHELIAL CELLS PRESENT ABUNDANT GRAM NEGATIVE RODS MODERATE GRAM POSITIVE COCCI IN PAIRS MODERATE GRAM POSITIVE RODS Performed at [facility], [place] [address], [place], [place] [address]    Culture (A)  Final    MULTIPLE ORGANISMS PRESENT, NONE PREDOMINANT NO ANAEROBES ISOLATED; CULTURE IN PROGRESS FOR 5 DAYS    Report Status PENDING  Incomplete  Fungus Culture Result     Status: None   Collection Time: 10/26/20  3:59 PM  Result Value Ref Range Status   Result 1 Comment  Final    Comment: (NOTE) KOH/Calcofluor preparation:  no fungus observed. Performed At: [facility] [place], [place] [id number] Rush Farmer MD DJ:[id number]      Scheduled Meds: . amLODipine  10 mg Oral Daily  . aspirin  325 mg Oral Daily  . cholecalciferol  1,000 Units Oral Daily  . collagenase   Topical Daily  . doxycycline  100 mg Oral Q12H  . epoetin alfa-epbx  8,000 Units Subcutaneous Q14 Days  . feeding supplement (GLUCERNA SHAKE)  237 mL Oral TID BM  . heparin  5,000 Units Subcutaneous Q8H  . hydrALAZINE  100 mg Oral TID  . hydrocerin   Topical Daily  . insulin aspart  0-15 Units Subcutaneous TID WC  . insulin aspart  0-5 Units Subcutaneous QHS  . insulin detemir  20 Units Subcutaneous QHS  . metoprolol succinate  100 mg Oral QPM  . nystatin   Topical TID  . pravastatin  80 mg Oral QPM  . vitamin C  1,000 mg Oral Daily   Continuous Infusions: .  ceFAZolin (ANCEF) IV 1 g (10/29/20 1014)  Procedures/Studies: DG Chest 2  View  Result Date: 10/25/2020 CLINICAL DATA:  CHF with leg swelling and ulcer to right lateral malleolus. EXAM: CHEST - 2 VIEW COMPARISON:  09/26/20 FINDINGS: There is mild cardiac enlargement, stable from previous exam. No pleural effusion or interstitial edema identified. No airspace densities. IMPRESSION: No acute findings.  No evidence for congestive heart failure. Electronically Signed   By: Kerby Moors M.D.   On: 10/25/2020 14:21   DG Ankle Complete Right  Result Date: 10/25/2020 CLINICAL DATA:  CHF, lower extremity swelling, lateral malleolar ulcer with concern for infection, no reported injury EXAM: RIGHT ANKLE - COMPLETE 3+ VIEW COMPARISON:  09/26/2020 right ankle radiographs FINDINGS: Diffuse right ankle soft tissue swelling. Small soft tissue defect at the lateral malleolus compatible with reported ulcer. Subtle loss of cortical line at the lateral distal margin of the lateral malleolus suspicious for early bony erosion. No fracture. No subluxation. Severe tibiotalar osteoarthritis. Intact staples at the lateral aspect of the calcaneus. Partially visualized intact screw at the base of fifth metatarsal. No focal osseous lesions. IMPRESSION: Diffuse right ankle soft tissue swelling. Small soft tissue defect at the lateral malleolus compatible with reported ulcer. Subtle loss of cortical line at the lateral distal margin of the lateral malleolus, suspicious for early bony erosion due to osteomyelitis. Consider MRI of the right ankle without IV contrast for further evaluation as clinically warranted. Electronically Signed   By: Ilona Sorrel M.D.   On: 10/25/2020 14:24   MR ANKLE RIGHT WO CONTRAST  Result Date: 10/26/2020 CLINICAL DATA:  Lateral right ankle ulceration EXAM: MRI OF THE RIGHT ANKLE WITHOUT CONTRAST TECHNIQUE: Multiplanar, multisequence MR imaging of the ankle was performed. No intravenous contrast was administered. COMPARISON:  X-ray 10/25/2020 FINDINGS: TENDONS Peroneal: Tendinosis with  longitudinal split tear of the infra-malleolar aspect of the peroneus brevis tendon. Intact peroneus longus tendon. No tenosynovitis. Posteromedial: Intact tibialis posterior, flexor hallucis longus and flexor digitorum longus tendons. Anterior: Intact tibialis anterior, extensor hallucis longus and extensor digitorum longus tendons. Achilles: Intact. Plantar Fascia: Intact. LIGAMENTS Lateral: Anterior talofibular ligament and calcaneofibular ligaments are not clearly seen, and likely chronically torn. Posterior talofibular ligament is heterogeneous but intact. Intact anterior and posterior tibiofibular ligaments. Medial: Deltoid ligament appears chronically torn. Portions of the spring ligament complex are ill-defined. CARTILAGE Ankle Joint: Severe tibiotalar osteoarthritis with full-thickness cartilage loss most pronounced anteriorly. No joint effusion. Prominent osseous densities along the posterior aspect of the tibiotalar and subtalar joints, largest measuring up to 2.4 cm. Subtalar Joints/Sinus Tarsi: Moderate arthropathy of the posterior subtalar joint. No effusion. Preservation of the anatomic fat within the sinus tarsi. Bones: No acute fracture or dislocation. No bony erosion or cortical destruction. No marrow edema. Well healed calcaneal osteotomy. Metallic susceptibility artifact from hardware within the fifth metatarsal. Other: Soft tissue swelling with superficial ulceration overlying the lateral malleolus. No organized fluid collection. Diffuse edema-like signal of the visualized foot and lower leg musculature. There is also fatty atrophy of the visualized musculature compatible with chronic denervation. IMPRESSION: 1. Soft tissue swelling with superficial ulceration overlying the lateral malleolus. No organized fluid collection. No evidence of osteomyelitis. 2. Severe tibiotalar osteoarthritis with prominent osseous densities along the posterior aspect of the tibiotalar and subtalar joints, largest  measuring up to 2.4 cm. 3. Fatty atrophy and edema throughout the visualized foot and lower leg musculature, likely representing a combination of denervation changes and/or myositis. Findings are in agreement with the preliminary report provided by Dr. Francoise Ceo. Preliminary report was  called to the ordering provider Eustaquio Maize, PA at 5:54 p.m. on 10/25/2020 by Dr. Francoise Ceo. Electronically Signed   By: Davina Poke D.O.   On: 10/26/2020 08:32    Orson Eva, DO  [facility]  If 7PM-7AM, please contact night-coverage [email or web address] Password [email or web address] 10/29/2020, 3:54 PM   LOS: 1 day

## 2020-10-29 NOTE — Care Management Important Message (Signed)
Important Message  Patient Details  Name: Danny [name] MRN: [id number] Date of Birth: August 14, 1955   Medicare Important Message Given:  Yes     [name] 10/29/2020, 11:32 AM

## 2020-10-29 NOTE — Progress Notes (Signed)
[facility]  No fungus on culture. Multiple organisms consistent with chronic wound. Pathology pending.        Skin with less scaling and dry areas. Redness likely from venous stasis, has been on the antibiotics for cellulitis, no drainage. Right malleolus wound still macerated on edges but improving.   Continue wound care per Wound RN. Says he can do santyl at home and wrapping. New Afib so not going home today.  Discussed with Dr. Carles Collet.  Curlene Labrum, MD [facility] [address] [place], [place] [address] [phone] (office)

## 2020-10-29 NOTE — Plan of Care (Signed)
  Problem: Education: Goal: Knowledge of General Education information will improve Description: Including pain rating scale, medication(s)/side effects and non-pharmacologic comfort measures Outcome: Progressing   Problem: Health Behavior/Discharge Planning: Goal: Ability to manage health-related needs will improve Outcome: Not Progressing   Problem: Clinical Measurements: Goal: Ability to maintain clinical measurements within normal limits will improve Outcome: Not Progressing Goal: Will remain free from infection Outcome: Not Progressing Goal: Diagnostic test results will improve Outcome: Not Progressing Goal: Respiratory complications will improve Outcome: Progressing Goal: Cardiovascular complication will be avoided Outcome: Not Progressing   Problem: Activity: Goal: Risk for activity intolerance will decrease Outcome: Not Progressing   Problem: Nutrition: Goal: Adequate nutrition will be maintained Outcome: Progressing   Problem: Coping: Goal: Level of anxiety will decrease Outcome: Progressing   Problem: Elimination: Goal: Will not experience complications related to bowel motility Outcome: Progressing Goal: Will not experience complications related to urinary retention Outcome: Progressing   Problem: Pain Managment: Goal: General experience of comfort will improve Outcome: Progressing   Problem: Safety: Goal: Ability to remain free from injury will improve Outcome: Progressing   Problem: Skin Integrity: Goal: Risk for impaired skin integrity will decrease Outcome: Not Progressing   Problem: Clinical Measurements: Goal: Ability to avoid or minimize complications of infection will improve Outcome: Not Progressing   Problem: Skin Integrity: Goal: Skin integrity will improve Outcome: Not Progressing

## 2020-10-29 NOTE — TOC Progression Note (Addendum)
Transition of Care [facility]) - Progression Note    Patient Details  Name: Danny [name] MRN: [id number] Date of Birth: 1955-09-13  Transition of Care [facility]) CM/SW Contact  Natasha Bence, LCSW Phone Number: 10/29/2020, 5:13 PM  Clinical Narrative:   [facility] reported that they are unable to transport patient on 11/02/2020. CSW placed transport referral with [facility]. [facility] transportation agreeable to transport patient to out patient wound care. CSW contacted, [facility], and [facility] for [facility]. [facility] and [facility] not able to provide [facility] services. TOC to follow.   Expected Discharge Plan: [place] Barriers to Discharge: Continued Medical Work up  Expected Discharge Plan and Services Expected Discharge Plan: [place] arrangements for the past 2 months: Apartment                           HH Arranged: RN,PT [place]: [facility]) Date [place]: 10/26/20 Time [place]: [place] Representative spoke with at [place]: [place] ([place]) Interventions    Readmission Risk Interventions Readmission Risk Prevention Plan 03/24/2020 07/11/2019 03/05/2019  Transportation Screening Complete Complete Complete  PCP or Specialist Appt within 3-5 Days Not Complete - Not Complete  Not Complete comments - - Going to facility and will be seen by facility medical director.  [place] or Home Care Consult Complete - Complete  Social Work Consult for Recovery Care Planning/Counseling Complete - Complete  Palliative Care Screening Not Applicable - Not Applicable  Medication Review (RN Care Manager) Complete Complete Complete  PCP or Specialist appointment within 3-5 days of discharge - Not Complete -  [place] or Home Care Consult - Complete -  SW Recovery Care/Counseling Consult - Complete -  Palliative Care Screening - Not Complete -  [place] - Complete -   Some recent data might be hidden

## 2020-10-30 ENCOUNTER — Inpatient Hospital Stay (HOSPITAL_COMMUNITY): Payer: Medicare HMO

## 2020-10-30 DIAGNOSIS — N184 Chronic kidney disease, stage 4 (severe): Secondary | ICD-10-CM | POA: Diagnosis not present

## 2020-10-30 DIAGNOSIS — E11622 Type 2 diabetes mellitus with other skin ulcer: Secondary | ICD-10-CM | POA: Diagnosis not present

## 2020-10-30 DIAGNOSIS — L03119 Cellulitis of unspecified part of limb: Secondary | ICD-10-CM | POA: Diagnosis not present

## 2020-10-30 DIAGNOSIS — I4891 Unspecified atrial fibrillation: Secondary | ICD-10-CM | POA: Diagnosis not present

## 2020-10-30 DIAGNOSIS — Z9119 Patient's noncompliance with other medical treatment and regimen: Secondary | ICD-10-CM | POA: Diagnosis not present

## 2020-10-30 LAB — BASIC METABOLIC PANEL
Anion gap: 8 (ref 5–15)
BUN: 59 mg/dL — ABNORMAL HIGH (ref 8–23)
CO2: 21 mmol/L — ABNORMAL LOW (ref 22–32)
Calcium: 8.4 mg/dL — ABNORMAL LOW (ref 8.9–10.3)
Chloride: 105 mmol/L (ref 98–111)
Creatinine, Ser: 4.73 mg/dL — ABNORMAL HIGH (ref 0.61–1.24)
GFR, Estimated: 13 mL/min — ABNORMAL LOW (ref >60)
Glucose, Bld: 88 mg/dL (ref 70–99)
Potassium: 4.8 mmol/L (ref 3.5–5.1)
Sodium: 134 mmol/L — ABNORMAL LOW (ref 135–145)

## 2020-10-30 LAB — CULTURE, BLOOD (ROUTINE X 2)
Culture: NO GROWTH
Culture: NO GROWTH
Special Requests: ADEQUATE

## 2020-10-30 LAB — ECHOCARDIOGRAM COMPLETE
Area-P 1/2: 2.96 cm2
Height: 73 in
S' Lateral: 3.08 cm
Weight: 4476.22 oz

## 2020-10-30 LAB — CBC
HCT: 27 % — ABNORMAL LOW (ref 39.0–52.0)
Hemoglobin: 8.6 g/dL — ABNORMAL LOW (ref 13.0–17.0)
MCH: 27.5 pg (ref 26.0–34.0)
MCHC: 31.9 g/dL (ref 30.0–36.0)
MCV: 86.3 fL (ref 80.0–100.0)
Platelets: 205 10*3/uL (ref 150–400)
RBC: 3.13 MIL/uL — ABNORMAL LOW (ref 4.22–5.81)
RDW: 17.1 % — ABNORMAL HIGH (ref 11.5–15.5)
WBC: 6.7 10*3/uL (ref 4.0–10.5)
nRBC: 0 % (ref 0.0–0.2)

## 2020-10-30 LAB — GLUCOSE, CAPILLARY
Glucose-Capillary: 71 mg/dL (ref 70–99)
Glucose-Capillary: 126 mg/dL — ABNORMAL HIGH (ref 70–99)
Glucose-Capillary: 123 mg/dL — ABNORMAL HIGH (ref 70–99)
Glucose-Capillary: 142 mg/dL — ABNORMAL HIGH (ref 70–99)

## 2020-10-30 LAB — TROPONIN I (HIGH SENSITIVITY): Troponin I (High Sensitivity): 12 ng/L (ref <18)

## 2020-10-30 LAB — TSH: TSH: 2.028 u[IU]/mL (ref 0.350–4.500)

## 2020-10-30 MED ORDER — SILVER NITRATE-POT NITRATE 75-25 % EX MISC
1.0000 "application " | Freq: Once | CUTANEOUS | Status: AC
  Administered 2020-10-30: 1 via TOPICAL
  Filled 2020-10-30: qty 10

## 2020-10-30 MED ORDER — SALINE SPRAY 0.65 % NA SOLN
1.0000 | NASAL | Status: DC | PRN
  Administered 2020-10-30: 1 via NASAL
  Filled 2020-10-30: qty 44

## 2020-10-30 NOTE — Consult Note (Signed)
[name] Admit Date: 10/25/2020 10/30/2020 Rexene Agent Requesting Physician:  Tat MD  Reason for Consult:  AoCKD4, Hypervolemia HPI:  54M admitted on 5/2 after presenting with progressive discomforting leg edema with erythema and weakness.  Past history includes CKD4 followed by Dr. Theador Hawthorne with [facility] likely from DKD, hypertension, chronic venous stasis dermatitis/lymphedema, DM2.  Patient admitted and has been treated for bilateral venous stasis/cellulitis, initially with vancomycin and then transitioned to linezolid and eventually cefazolin/doxycycline.   He also has been seen by general surgery and been biopsied.  His legs are wrapped.    Trend in creatinine is as follows: Creatinine, Ser (mg/dL)  Date Value  10/30/2020 4.73 (H)  10/29/2020 4.29 (H)  10/28/2020 3.93 (H)  10/27/2020 3.81 (H)  10/26/2020 3.80 (H)  10/25/2020 3.79 (H)  09/26/2020 3.96 (H)  08/17/2020 3.69 (H)  08/05/2020 3.42 (H)  07/06/2020 3.34 (H)  ] Patient denies use of NSAIDs prior to admission.  No other clear nephrotoxic exposures.  No imaging during this admission.  Urinalysis with 4+ proteinuria, 6-10 erythrocytes per high-powered field, no pyuria.  TTE today with LVEF of 55 to 60%, moderate left ventricular hypertrophy, indeterminate findings for diastolic dysfunction.  Right ventricular systolic function is mildly reduced and there is severe elevation of the pulmonary arterial systolic pressure.  He has developed new onset atrial fibrillation during this admission.  Patient currently without complaints of nausea/vomiting, anorexia.  Urine output has been suboptimal given his volume status.  He was previously on diuretics but these have been held.  I/Os: I/O last 3 completed shifts: In: 1200 [P.O.:1200] Out: 950 [Urine:950]   ROS NSAIDS: No exposure identified IV Contrast no exposure identified TMP/SMX no exposure identified Hypotension not present Balance of 12 systems is negative w/ exceptions  as above  PMH  Past Medical History:  Diagnosis Date  . Anemia   . Arthritis   . CKD (chronic kidney disease), stage IV ([place])   . Diabetes mellitus without complication ([place])   . Foot ulcer ([place])   . Hypertension   . Urinary retention    PSH  Past Surgical History:  Procedure Laterality Date  . ANKLE SURGERY Right   . CHOLECYSTECTOMY    . FOOT SURGERY Right   . TRANSURETHRAL RESECTION OF PROSTATE N/A 01/15/2020   Procedure: TRANSURETHRAL RESECTION OF THE PROSTATE (TURP)  with General anesthesia and spinal;  Surgeon: Cleon Gustin, MD;  Location: [facility];  Service: Urology;  Laterality: N/A;   FH  Family History  Problem Relation Age of Onset  . Diabetes Mother   . Heart attack Mother   . Heart attack Father   . Diabetes Brother    SH  reports that he has never smoked. He has never used smokeless tobacco. He reports that he does not drink alcohol and does not use drugs. Allergies  Allergies  Allergen Reactions  . Dust Mite Extract Itching and Other (See Comments)    Unknown reaction-potential shortness of breath  . Prednisone Nausea And Vomiting  . Rocephin [Ceftriaxone] Nausea And Vomiting   Home medications Prior to Admission medications   Medication Sig Start Date End Date Taking? Authorizing Provider  acetaminophen (TYLENOL) 500 MG tablet Take 1,000 mg by mouth every 6 (six) hours as needed for moderate pain or headache.   Yes [provider]  amLODipine (NORVASC) 10 MG tablet Take 10 mg by mouth daily.  10/14/19  Yes [provider]  Ascorbic Acid (VITAMIN C) 500 MG CHEW Chew 1,000  mg by mouth daily.   Yes [provider]  aspirin 325 MG tablet Take 325 mg by mouth daily.   Yes [provider]  cholecalciferol (VITAMIN D3) 25 MCG (1000 UNIT) tablet Take 1,000 Units by mouth daily.   Yes [provider]  epoetin alfa-epbx (RETACRIT) 06269 UNIT/ML injection 8,000 Units every 14 (fourteen) days. 08/17/20  Yes Bhutani,  Manpreet S, MD  ferrous sulfate 325 (65 FE) MG tablet Take 325 mg by mouth 2 (two) times daily with a meal.   Yes [provider]  hydrALAZINE (APRESOLINE) 100 MG tablet Take 100 mg by mouth 2 (two) times daily. 09/03/20 09/03/21 Yes [provider]  Magnesium 100 MG CAPS Take 100 mg by mouth daily.    Yes [provider]  metoprolol succinate (TOPROL-XL) 100 MG 24 hr tablet Take 1 tablet (100 mg total) by mouth every evening. 03/20/19  Yes Gerlene Fee, NP  Multiple Vitamin (MULTIVITAMIN WITH MINERALS) TABS tablet Take 1 tablet by mouth daily.   Yes [provider]  NOVOLIN 70/30 RELION (70-30) 100 UNIT/ML injection Inject 20-100 Units into the skin in the morning and at bedtime.  08/13/19  Yes [provider]  Omega-3 Fatty Acids (FISH OIL PO) Take 2,400 mg by mouth 2 (two) times daily.   Yes [provider]  pravastatin (PRAVACHOL) 80 MG tablet Take 1 tablet (80 mg total) by mouth every evening. 03/20/19  Yes Gerlene Fee, NP  torsemide (DEMADEX) 20 MG tablet Take 2 tablets by mouth daily; okay to use extra 20 mg daily if patient gain more than 3 pounds overnight or more than 5 pounds in a week. 03/30/20  Yes Barton Dubois, MD  vitamin B-12 (CYANOCOBALAMIN) 1000 MCG tablet Take 1,000 mcg by mouth daily.   Yes [provider]  vitamin E 1000 UNIT capsule Take 1,000 Units by mouth daily.   Yes [provider]  Zinc 30 MG CAPS Take 1 capsule by mouth daily.   Yes [provider]  doxycycline (VIBRAMYCIN) 100 MG capsule Take 1 capsule (100 mg total) by mouth 2 (two) times daily. Patient not taking: No sig reported 09/26/20   Noemi Chapel, MD  ketotifen (ALLERGY EYE DROPS) 0.025 % ophthalmic solution Place 1 drop into both eyes daily as needed (allergies/dry eyes). Patient not taking: Reported on 10/26/2020 03/30/20   Barton Dubois, MD    Current Medications Scheduled Meds: . amLODipine  10 mg Oral Daily  . aspirin   325 mg Oral Daily  . cholecalciferol  1,000 Units Oral Daily  . collagenase   Topical Daily  . doxycycline  100 mg Oral Q12H  . epoetin alfa-epbx  8,000 Units Subcutaneous Q14 Days  . feeding supplement (GLUCERNA SHAKE)  237 mL Oral TID BM  . heparin  5,000 Units Subcutaneous Q8H  . hydrALAZINE  100 mg Oral TID  . hydrocerin   Topical Daily  . insulin aspart  0-15 Units Subcutaneous TID WC  . insulin aspart  0-5 Units Subcutaneous QHS  . insulin detemir  20 Units Subcutaneous QHS  . metoprolol succinate  100 mg Oral QPM  . nystatin   Topical TID  . pravastatin  80 mg Oral QPM  . vitamin C  1,000 mg Oral Daily   Continuous Infusions: .  ceFAZolin (ANCEF) IV 1 g (10/30/20 1139)   PRN Meds:.acetaminophen **OR** acetaminophen, ondansetron **OR** ondansetron (ZOFRAN) IV, oxyCODONE, sodium chloride  CBC Recent Labs  Lab 10/25/20 1327 10/26/20 0530 10/28/20 0542  10/29/20 0606 10/30/20 0534  WBC 7.4   < > 6.6 7.1 6.7  NEUTROABS 6.0  --   --   --   --   HGB 8.9*   < > 8.6* 8.3* 8.6*  HCT 28.0*   < > 26.6* 26.1* 27.0*  MCV 85.1   < > 85.3 86.4 86.3  PLT 188   < > 180 174 205   < > = values in this interval not displayed.   Basic Metabolic Panel Recent Labs  Lab 10/25/20 1327 10/26/20 0530 10/27/20 0455 10/28/20 0542 10/29/20 0606 10/30/20 0534  NA 137 138 137 136 136 134*  K 4.2 4.1 4.1 4.2 4.7 4.8  CL 107 108 109 106 109 105  CO2 22 23 21* 22 21* 21*  GLUCOSE 151* 124* 85 82 106* 88  BUN 47* 47* 48* 49* 52* 59*  CREATININE 3.79* 3.80* 3.81* 3.93* 4.29* 4.73*  CALCIUM 8.6* 8.6* 8.3* 8.5* 8.3* 8.4*    Physical Exam  Blood pressure 115/71, pulse 81, temperature 97.9 F (36.6 C), temperature source Oral, resp. rate 19, height 6\' 1"  (1.854 m), weight 126.9 kg, SpO2 100 %. GEN: Chronically ill-appearing, NAD ENT: NCAT EYES: EOMI CV: Regular, normal S1 and S2 PULM: Diminished in the bases, no wheezing ABD: Soft, nontender, bowel sounds present SKIN: Lateral erythema  surrounding his leg wrappings/[place] boots EXT: 3+ edema extending into the proximal thighs and in the sacral area   Assessment 80M with progressive proteinuric CKD, stage IV, admitted with bilateral worsening lower extremity edema with accompanying cellulitis/dermatitis and progressive edema into the trunk.  I think the change in kidney function over the past 24 hours might be still within his usual range, I am not sure how much of an acute component there is here.  He certainly needs his volume status improved with at all possible, but I like to see his kidney function level off first.  I do not think we need a renal ultrasound unless things continue to worsen.  I think that is medication management has been reasonable to try to avoid any further injury.  1. CKD 4, proteinuria, progressive, presumed secondary to DKD, followed by Theador Hawthorne at [facility] 2. Cardiac lower extremity edema/venous stasis with dermatitis, question superinfection on antibiotics; presumably etiology is related to #1 and component of cardiac disease 3. New onset atrial fibrillation, per TRH, rate control, not yet on anticoagulation 4. Progressive debility, patient has declined SNF 5. Anemia, stable, transfuse as needed, can consider ESA therapy as an outpatient  Plan 1. Largely as above, I think we need to readdress diuretics and will do so once kidney function is stable, probably benefit from a few days of parenteral diuretics to improve overall status and his legs. 2. We will follow-up tomorrow by lab review and discussed with TRH, or any other concerns we will be happy to see the patient, we will see the patient certainly on Monday. 3. Daily weights, Daily Renal Panel, Strict I/Os, Avoid nephrotoxins (NSAIDs, judicious IV Contrast)    Rexene Agent  10/30/2020, 7:26 PM

## 2020-10-30 NOTE — Progress Notes (Signed)
  Echocardiogram 2D Echocardiogram has been performed.  Danny [name] 10/30/2020, 3:40 PM

## 2020-10-30 NOTE — Progress Notes (Signed)
PROGRESS NOTE  Danny [name] JYN:[id number] DOB: December 25, 1955 DOA: 10/25/2020 PCP: Celene Squibb, MD   Brief History: 7 y/owith a history of hypertension, diabetes mellitus type 2, CKD stage III, psoriasis, venous stasis dermatitis, lymphedema presenting with 2-weekhistory of his legs edema, pain, erythema and becoming weak. He reports he was placed on a 2 week course of doxy in the beginning of April with some improvement, but his legs have worsened since finishing doxy 1-2 weeks prior to this admission. He states he is WC bound and has not ambulated since July 2021. He reports his legs have been draining yellow clear and cloudy fluid.  In the ED Temp 97.5, heart rate 81, respiratory rate 16-18, blood pressure 132/76, satting at 97% No leukocytosis with white blood cell count of 7.4, hemoglobin 8.9 Chemistry panel reveals an elevated BUN and creatinine that are close to his baseline at 47 and 3.79 Glucose is 151 UA is not indicative of UTI CRP is 3.4, sed rate is 69 COVIDneg Blood cultures pending Official read of the MR ankle pending-ED provider reports the preliminary read was no concern for osteomyelitis EKG shows a heart rate of 97, QTc 477, PVCs Chest x-ray is negative Alk phos is elevated 131 Admission was requested to antibiotics and wound care to patient. Does not have reliable meansto follow-up outpatient  Assessment/Plan: Bilateral lower extremity cellulitis-ankle open wound, chronic venous stasisdermatitis 1. Likely a component of cellulitis and a component of venous stasis contributing to the open wounds on patient's leg; Failed outpatient antibiogics 2. Wound care consulted, also general surgery consulted for evaluation possible debridement. Appreciate Dr. Pamelia Hoit taken. Patient not interested in surgery or amputation 3. Patient was initiated on vancomycin, due to acute on chronic kidney disease, switch to linezolidinitially. 4. Came to the  ER earlier in April and was started on doxycycline, reports taking all of it but then having no way to follow-up with any provider secondary to transportation barriers, now legs worsening 5. Vancomycin started in the ED.Marland KitchenMarland KitchenDiscontinued 10/26/2020 6. Blood cultures>>>>neg to date 7. MRI ankle--no abscess, no osteomyelitis. Fatty atrophy of visualized musculature = chronic devervation 8. CRP 3.4, ESR 69 9. D/c zyvox. Start cefazolin anddoxy 5/4 10. Appreciate general surgery consult-->bx and culture done 5/3 11. SW/TOC assisting with setting up transport with [facility] and appointment with outpatient wound care clinic 12. Right heel repeat biopsy by Dr. Constance Haw b/c cannot r/o SCC on prior bx on 10/26/20  New Onset Atrial fibrillation -personally reviewed EKG--Afib, nonspecific T wave change -rate controlled -CHADS-VASc = 5 (CHF, HTN, Age, DM, ASVD) -continue metoprolol succinate -start apixaban 5/8 if no further procedures planned -Echo -TSH 2.028  Fall and Generalized weakness 1. Mechanical fall no LOC, did not hit head 2. Consult PT-->SNF-->pt refuses  DMII-uncontrolled with hyperglycemia 1. CBG QA CHS, SSI coverage, 2. Takes 70/30 20-100 unitsat home 3. Continue Lantus 20 units 4. A1c:7.6 5. Carb modified diet Moderate protein cal malnutrition 1. Albumin 2.8 2. Counseled on the importance of nutrient dense diet 3. Glucerna shakes 4. Continue to monitor Medical non compliance 1. TOC consulted for possible PT at home?Any assistance with transportation for medical appointments? Acute on chronic CKD 4 -Trend BUN/creatinine closely, -baseline creatinine 3.7-4.0 -serum creatinine up to 4.8 -renal consulted  Class 2 Obesity -BMI 36.91 -lifestyle modification  Atypical Chest pain -had CP 10/30/20 -personally reviewed EKG--sinus, nonspecific T wave change -troponin = 12>>     Status QM:VHQIONGEX  The patient  will require care spanning > 2  midnights and should be moved to inpatient because:IV treatments appropriate due to intensity of illness or inability to take PO  Dispo: The patient is from:Home Anticipated d/c is PH:KFEX Patient currently is not medically stable to d/c. Difficult to place patient no        Family Communication:noFamily at bedside  Consultants:none  Code Status: FULL  DVT Prophylaxis: [place] Heparin    Procedures: As Listed in Progress Note Above  Antibiotics: zyvox 5/3>>5/4 Cefazolin 5/4>> Doxy 5/4>>   Subjective: Patient had cp lasting 10 min during foot biopsy.  Has some DOE.  Denies cough, hemoptysis, n/v/d, abd pain  Objective: Vitals:   10/29/20 1434 10/29/20 2148 10/30/20 0631 10/30/20 1345  BP: 117/75 125/60 120/63 115/71  Pulse: 93 82 84 81  Resp: 20 17 19 19   Temp: 97.9 F (36.6 C) (!) 97.3 F (36.3 C) 99.6 F (37.6 C) 97.9 F (36.6 C)  TempSrc: Oral Oral Oral Oral  SpO2: 91% 94% 92% 100%  Weight:      Height:        Intake/Output Summary (Last 24 hours) at 10/30/2020 1601 Last data filed at 10/30/2020 1300 Gross per 24 hour  Intake 720 ml  Output 850 ml  Net -130 ml   Weight change:  Exam:   General:  Pt is alert, follows commands appropriately, not in acute distress  HEENT: No icterus, No thrush, No neck mass, [place]/AT  Cardiovascular: RRR, S1/S2, no rubs, no gallops  Respiratory: bibasilar crackles. No wheeze  Abdomen: Soft/+BS, non tender, non distended, no guarding  Extremities: 2+ LE edema, No lymphangitis, No petechiae, No rashes, no synovitis   Data Reviewed: I have personally reviewed following labs and imaging studies Basic Metabolic Panel: Recent Labs  Lab 10/26/20 0530 10/27/20 0455 10/28/20 0542 10/29/20 0606 10/30/20 0534  NA 138 137 136 136 134*  K 4.1 4.1 4.2 4.7 4.8  CL 108 109 106 109 105  CO2 23 21* 22 21* 21*  GLUCOSE 124* 85 82 106* 88  BUN 47* 48* 49* 52* 59*   CREATININE 3.80* 3.81* 3.93* 4.29* 4.73*  CALCIUM 8.6* 8.3* 8.5* 8.3* 8.4*  MG 2.0  --   --   --   --    Liver Function Tests: Recent Labs  Lab 10/25/20 1327 10/26/20 0530  AST 23 20  ALT 26 22  ALKPHOS 131* 124  BILITOT 0.7 0.9  PROT 7.2 6.8  ALBUMIN 2.8* 2.6*   No results for input(s): LIPASE, AMYLASE in the last 168 hours. No results for input(s): AMMONIA in the last 168 hours. Coagulation Profile: No results for input(s): INR, PROTIME in the last 168 hours. CBC: Recent Labs  Lab 10/25/20 1327 10/26/20 0530 10/27/20 0455 10/28/20 0542 10/29/20 0606 10/30/20 0534  WBC 7.4 7.2 6.8 6.6 7.1 6.7  NEUTROABS 6.0  --   --   --   --   --   HGB 8.9* 8.6* 8.5* 8.6* 8.3* 8.6*  HCT 28.0* 27.0* 26.6* 26.6* 26.1* 27.0*  MCV 85.1 86.0 85.0 85.3 86.4 86.3  PLT 188 171 186 180 174 205   Cardiac Enzymes: No results for input(s): CKTOTAL, CKMB, CKMBINDEX, TROPONINI in the last 168 hours. BNP: Invalid input(s): POCBNP CBG: Recent Labs  Lab 10/29/20 1117 10/29/20 1622 10/29/20 2147 10/30/20 0731 10/30/20 1107  GLUCAP 153* 146* 136* 71 126*   HbA1C: No results for input(s): HGBA1C in the last 72 hours. Urine analysis:    Component Value Date/Time  COLORURINE YELLOW 10/25/2020 [place] 10/25/2020 1516   APPEARANCEUR Clear 09/10/2020 1122   LABSPEC 1.010 10/25/2020 1516   PHURINE 6.0 10/25/2020 1516   GLUCOSEU 150 (A) 10/25/2020 1516   HGBUR SMALL (A) 10/25/2020 1516   BILIRUBINUR NEGATIVE 10/25/2020 1516   BILIRUBINUR Negative 09/10/2020 1122   KETONESUR NEGATIVE 10/25/2020 1516   PROTEINUR >=300 (A) 10/25/2020 1516   NITRITE NEGATIVE 10/25/2020 1516   LEUKOCYTESUR NEGATIVE 10/25/2020 1516   Sepsis Labs: @LABRCNTIP (procalcitonin:4,lacticidven:4) ) Recent Results (from the past 240 hour(s))  Aerobic Culture w Gram Stain (superficial specimen)     Status: Abnormal   Collection Time: 10/25/20  3:29 PM   Specimen: Skin, Cyst/Tag/Debridement; Wound   Result Value Ref Range Status   Specimen Description   Final    SKIN Performed at [facility] [address] [place], [place] [address]    Special Requests   Final    NONE Performed at [facility], [address] [place], [place] [address]    Gram Stain   Final    RARE WBC PRESENT, PREDOMINANTLY PMN RARE GRAM POSITIVE COCCI IN PAIRS RARE GRAM NEGATIVE RODS Performed at [facility], [place] [address], [place], [place] [address]    Culture MULTIPLE ORGANISMS PRESENT, NONE PREDOMINANT (A)  Final   Report Status 10/27/2020 FINAL  Final  Culture, blood (routine x 2)     Status: None   Collection Time: 10/25/20  3:59 PM   Specimen: BLOOD  Result Value Ref Range Status   Specimen Description BLOOD LEFT ARM  Final   Special Requests   Final    BOTTLES DRAWN AEROBIC AND ANAEROBIC Blood Culture adequate volume   Culture   Final    NO GROWTH 5 DAYS Performed at [facility], [address] [place], [place] [address]    Report Status 10/30/2020 FINAL  Final  Culture, blood (routine x 2)     Status: None   Collection Time: 10/25/20  3:59 PM   Specimen: BLOOD  Result Value Ref Range Status   Specimen Description BLOOD RIGHT ARM  Final   Special Requests   Final    BOTTLES DRAWN AEROBIC AND ANAEROBIC Blood Culture results may not be optimal due to an excessive volume of blood received in culture bottles   Culture   Final    NO GROWTH 5 DAYS Performed at [facility], [address] [place], [place] [address]    Report Status 10/30/2020 FINAL  Final  SARS CORONAVIRUS 2 (Braxen Dobek 6-24 HRS) Nasopharyngeal Nasopharyngeal Swab     Status: None   Collection Time: 10/25/20  4:30 PM   Specimen: Nasopharyngeal Swab  Result Value Ref Range Status   SARS Coronavirus 2 NEGATIVE NEGATIVE Final    Comment: (NOTE) SARS-CoV-2 target nucleic acids are NOT DETECTED.  The SARS-CoV-2 RNA is generally detectable in upper and lower respiratory specimens during the acute phase of infection.  Negative results do not preclude SARS-CoV-2 infection, do not rule out co-infections with other pathogens, and should not be used as the sole basis for treatment or other patient management decisions. Negative results must be combined with clinical observations, patient history, and epidemiological information. The expected result is Negative.  Fact Sheet for Patients: SugarRoll.be  Fact Sheet for Healthcare Providers: https://www.woods-mathews.com/  This test is not yet approved or cleared by the [place] FDA and  has been authorized for detection and/or diagnosis of SARS-CoV-2 by FDA under an Emergency Use Authorization (EUA). This  EUA will remain  in effect (meaning this test can be used) for the duration of the COVID-19 declaration under Se ction 564(b)(1) of the Act, 21 U.S.C. section 360bbb-3(b)(1), unless the authorization is terminated or revoked sooner.  Performed at [facility], [place] [address], [place], [place] [address]   Aerobic Culture w Gram Stain (superficial specimen)     Status: Abnormal   Collection Time: 10/25/20  5:10 PM   Specimen: Ankle; Wound  Result Value Ref Range Status   Specimen Description   Final    ANKLE Performed at [facility], [address] [place], [place] [address]    Special Requests   Final    NONE Performed at [place] [place], [place] [address]    Gram Stain   Final    RARE WBC PRESENT, PREDOMINANTLY PMN MODERATE GRAM POSITIVE COCCI IN PAIRS IN CLUSTERS MODERATE GRAM NEGATIVE RODS RARE GRAM POSITIVE RODS Performed at [facility], [place] [address] [place], [place] [address]    Culture MULTIPLE ORGANISMS PRESENT, NONE PREDOMINANT (A)  Final   Report Status 10/28/2020 FINAL  Final  Fungus Culture With Stain     Status: None (Preliminary result)   Collection Time: 10/26/20  3:59 PM   Specimen: Ankle  Result Value Ref Range Status   Fungus Stain Final  report  Final    Comment: (NOTE) Performed At: [facility], [facility] [place], [place] [id number] Rush Farmer MD VX:[id number]    Fungus (Mycology) Culture PENDING  Incomplete   Fungal Source ANKLE  Final    Comment: WOUND Performed at [facility], [address] [place], [place] [address]   Aerobic/Anaerobic Culture w Gram Stain (surgical/deep wound)     Status: Abnormal (Preliminary result)   Collection Time: 10/26/20  3:59 PM   Specimen: Tissue  Result Value Ref Range Status   Specimen Description TISSUE RIGHT ANKLE  Final   Special Requests NONE  Final   Gram Stain   Final    MODERATE WBC PRESENT,BOTH PMN AND MONONUCLEAR FEW SQUAMOUS EPITHELIAL CELLS PRESENT ABUNDANT GRAM NEGATIVE RODS MODERATE GRAM POSITIVE COCCI IN PAIRS MODERATE GRAM POSITIVE RODS Performed at [facility], [name] [address] [place], [place] [address]    Culture (A)  Final    MULTIPLE ORGANISMS PRESENT, NONE PREDOMINANT NO ANAEROBES ISOLATED; CULTURE IN PROGRESS FOR 5 DAYS    Report Status PENDING  Incomplete  Fungus Culture Result     Status: None   Collection Time: 10/26/20  3:59 PM  Result Value Ref Range Status   Result 1 Comment  Final    Comment: (NOTE) KOH/Calcofluor preparation:  no fungus observed. Performed At: [facility] [place], [place] [id number] Rush Farmer MD LS:[id number]      Scheduled Meds: . amLODipine  10 mg Oral Daily  . aspirin  325 mg Oral Daily  . cholecalciferol  1,000 Units Oral Daily  . collagenase   Topical Daily  . doxycycline  100 mg Oral Q12H  . epoetin alfa-epbx  8,000 Units Subcutaneous Q14 Days  . feeding supplement (GLUCERNA SHAKE)  237 mL Oral TID BM  . heparin  5,000 Units Subcutaneous Q8H  . hydrALAZINE  100 mg Oral TID  . hydrocerin   Topical Daily  . insulin aspart  0-15 Units Subcutaneous TID WC  . insulin aspart  0-5 Units Subcutaneous QHS  . insulin detemir  20 Units Subcutaneous QHS  . metoprolol  succinate  100 mg Oral QPM  . nystatin   Topical TID  . pravastatin  80 mg Oral QPM  . vitamin C  1,000 mg Oral Daily   Continuous Infusions: .  ceFAZolin (ANCEF) IV 1 g (10/30/20 1139)    Procedures/Studies: DG Chest 2 View  Result Date: 10/25/2020 CLINICAL DATA:  CHF with leg swelling and ulcer to right lateral malleolus. EXAM: CHEST - 2 VIEW COMPARISON:  09/26/20 FINDINGS: There is mild cardiac enlargement, stable from previous exam. No pleural effusion or interstitial edema identified. No airspace densities. IMPRESSION: No acute findings.  No evidence for congestive heart failure. Electronically Signed   By: Kerby Moors M.D.   On: 10/25/2020 14:21   DG Ankle Complete Right  Result Date: 10/25/2020 CLINICAL DATA:  CHF, lower extremity swelling, lateral malleolar ulcer with concern for infection, no reported injury EXAM: RIGHT ANKLE - COMPLETE 3+ VIEW COMPARISON:  09/26/2020 right ankle radiographs FINDINGS: Diffuse right ankle soft tissue swelling. Small soft tissue defect at the lateral malleolus compatible with reported ulcer. Subtle loss of cortical line at the lateral distal margin of the lateral malleolus suspicious for early bony erosion. No fracture. No subluxation. Severe tibiotalar osteoarthritis. Intact staples at the lateral aspect of the calcaneus. Partially visualized intact screw at the base of fifth metatarsal. No focal osseous lesions. IMPRESSION: Diffuse right ankle soft tissue swelling. Small soft tissue defect at the lateral malleolus compatible with reported ulcer. Subtle loss of cortical line at the lateral distal margin of the lateral malleolus, suspicious for early bony erosion due to osteomyelitis. Consider MRI of the right ankle without IV contrast for further evaluation as clinically warranted. Electronically Signed   By: Ilona Sorrel M.D.   On: 10/25/2020 14:24   MR ANKLE RIGHT WO CONTRAST  Result Date: 10/26/2020 CLINICAL DATA:  Lateral right ankle ulceration EXAM:  MRI OF THE RIGHT ANKLE WITHOUT CONTRAST TECHNIQUE: Multiplanar, multisequence MR imaging of the ankle was performed. No intravenous contrast was administered. COMPARISON:  X-ray 10/25/2020 FINDINGS: TENDONS Peroneal: Tendinosis with longitudinal split tear of the infra-malleolar aspect of the peroneus brevis tendon. Intact peroneus longus tendon. No tenosynovitis. Posteromedial: Intact tibialis posterior, flexor hallucis longus and flexor digitorum longus tendons. Anterior: Intact tibialis anterior, extensor hallucis longus and extensor digitorum longus tendons. Achilles: Intact. Plantar Fascia: Intact. LIGAMENTS Lateral: Anterior talofibular ligament and calcaneofibular ligaments are not clearly seen, and likely chronically torn. Posterior talofibular ligament is heterogeneous but intact. Intact anterior and posterior tibiofibular ligaments. Medial: Deltoid ligament appears chronically torn. Portions of the spring ligament complex are ill-defined. CARTILAGE Ankle Joint: Severe tibiotalar osteoarthritis with full-thickness cartilage loss most pronounced anteriorly. No joint effusion. Prominent osseous densities along the posterior aspect of the tibiotalar and subtalar joints, largest measuring up to 2.4 cm. Subtalar Joints/Sinus Tarsi: Moderate arthropathy of the posterior subtalar joint. No effusion. Preservation of the anatomic fat within the sinus tarsi. Bones: No acute fracture or dislocation. No bony erosion or cortical destruction. No marrow edema. Well healed calcaneal osteotomy. Metallic susceptibility artifact from hardware within the fifth metatarsal. Other: Soft tissue swelling with superficial ulceration overlying the lateral malleolus. No organized fluid collection. Diffuse edema-like signal of the visualized foot and lower leg musculature. There is also fatty atrophy of the visualized musculature compatible with chronic denervation. IMPRESSION: 1. Soft tissue swelling with superficial ulceration  overlying the lateral malleolus. No organized fluid collection. No evidence of osteomyelitis. 2. Severe tibiotalar osteoarthritis with prominent osseous densities along the posterior aspect of the tibiotalar and subtalar joints,  largest measuring up to 2.4 cm. 3. Fatty atrophy and edema throughout the visualized foot and lower leg musculature, likely representing a combination of denervation changes and/or myositis. Findings are in agreement with the preliminary report provided by Dr. Francoise Ceo. Preliminary report was called to the ordering provider Eustaquio Maize, PA at 5:54 p.m. on 10/25/2020 by Dr. Francoise Ceo. Electronically Signed   By: Davina Poke D.O.   On: 10/26/2020 08:32    Orson Eva, DO  [facility]  If 7PM-7AM, please contact night-coverage [email or web address] Password [email or web address] 10/30/2020, 4:01 PM   LOS: 2 days

## 2020-10-30 NOTE — Progress Notes (Signed)
While assisting Dr. Constance Haw with biopsy, patient started to c/o "not feeling well." Patient reports mild chest pain, eventually describing it as "pressure." EKG obtained while Dr. Constance Haw was in room, Dr. Carles Collet made aware. After a few minutes, patient reports the chest pain was gone. Remains on tele, will continue to monitor.

## 2020-10-30 NOTE — Progress Notes (Addendum)
[facility]  FINAL MICROSCOPIC DIAGNOSIS:   A. SKIN, ANKLE, RIGHT, BIOPSY:  - Superficial fragments of atypical verrucous hyperplasia.  - See comment.   COMMENT:   Well-differentiated squamous cell carcinoma cannot be ruled out.  Additional biopsies are recommended.   Dr. Cristina Gong has reviewed this case and agrees.   Discussed with patient, agreed to additional biopsy. Discussed risk of bleeding, infection, finding cancer.   Procedure: Excisional biopsy of right ankle skin  Description: The right ankle area was prepped with betadine and the lateral malleolus wound was inspected. Two separate areas were excised with a scalpel.  Pressure was held for 10 minutes. Silver nitrate applied. The legs were redressed per the orders by the RN.  I will notify the patient of the results via phone.  He is aware of his wound care needs and follow up.   Specimen dropped off at pathology and formalin applied.   Curlene Labrum, MD [facility] [address] [place], [place] [address] [phone] (office)

## 2020-10-31 DIAGNOSIS — Z9119 Patient's noncompliance with other medical treatment and regimen: Secondary | ICD-10-CM | POA: Diagnosis not present

## 2020-10-31 DIAGNOSIS — E11622 Type 2 diabetes mellitus with other skin ulcer: Secondary | ICD-10-CM | POA: Diagnosis not present

## 2020-10-31 DIAGNOSIS — N184 Chronic kidney disease, stage 4 (severe): Secondary | ICD-10-CM | POA: Diagnosis not present

## 2020-10-31 DIAGNOSIS — L97319 Non-pressure chronic ulcer of right ankle with unspecified severity: Secondary | ICD-10-CM | POA: Diagnosis not present

## 2020-10-31 LAB — BASIC METABOLIC PANEL
Anion gap: 9 (ref 5–15)
BUN: 64 mg/dL — ABNORMAL HIGH (ref 8–23)
CO2: 21 mmol/L — ABNORMAL LOW (ref 22–32)
Calcium: 8.1 mg/dL — ABNORMAL LOW (ref 8.9–10.3)
Chloride: 103 mmol/L (ref 98–111)
Creatinine, Ser: 4.78 mg/dL — ABNORMAL HIGH (ref 0.61–1.24)
GFR, Estimated: 13 mL/min — ABNORMAL LOW (ref >60)
Glucose, Bld: 108 mg/dL — ABNORMAL HIGH (ref 70–99)
Potassium: 5 mmol/L (ref 3.5–5.1)
Sodium: 133 mmol/L — ABNORMAL LOW (ref 135–145)

## 2020-10-31 LAB — GLUCOSE, CAPILLARY
Glucose-Capillary: 97 mg/dL (ref 70–99)
Glucose-Capillary: 115 mg/dL — ABNORMAL HIGH (ref 70–99)
Glucose-Capillary: 150 mg/dL — ABNORMAL HIGH (ref 70–99)

## 2020-10-31 LAB — CBC
HCT: 26.7 % — ABNORMAL LOW (ref 39.0–52.0)
Hemoglobin: 8.4 g/dL — ABNORMAL LOW (ref 13.0–17.0)
MCH: 26.9 pg (ref 26.0–34.0)
MCHC: 31.5 g/dL (ref 30.0–36.0)
MCV: 85.6 fL (ref 80.0–100.0)
Platelets: 194 10*3/uL (ref 150–400)
RBC: 3.12 MIL/uL — ABNORMAL LOW (ref 4.22–5.81)
RDW: 16.9 % — ABNORMAL HIGH (ref 11.5–15.5)
WBC: 7 10*3/uL (ref 4.0–10.5)
nRBC: 0 % (ref 0.0–0.2)

## 2020-10-31 MED ORDER — METOPROLOL SUCCINATE ER 25 MG PO TB24
25.0000 mg | ORAL_TABLET | Freq: Every evening | ORAL | Status: DC
  Administered 2020-11-01 – 2020-11-05 (×5): 25 mg via ORAL
  Filled 2020-10-31 (×6): qty 1

## 2020-10-31 MED ORDER — FUROSEMIDE 10 MG/ML IJ SOLN
120.0000 mg | Freq: Two times a day (BID) | INTRAVENOUS | Status: DC
  Administered 2020-10-31 – 2020-11-03 (×7): 120 mg via INTRAVENOUS
  Filled 2020-10-31 (×4): qty 12
  Filled 2020-10-31: qty 10
  Filled 2020-10-31 (×4): qty 12
  Filled 2020-10-31: qty 10
  Filled 2020-10-31: qty 12

## 2020-10-31 NOTE — Progress Notes (Signed)
PROGRESS NOTE  Danny [name] QQP:[id number] DOB: Apr 04, 1956 DOA: 10/25/2020 PCP: Celene Squibb, MD  Brief History: 65 y/owith a history of hypertension, diabetes mellitus type 2, CKD stage III, psoriasis, venous stasis dermatitis, lymphedema presenting with 2-weekhistory of his legs edema, pain, erythema and becoming weak. He reports he was placed on a 2 week course of doxy in the beginning of April with some improvement, but his legs have worsened since finishing doxy 1-2 weeks prior to this admission. He states he is WC bound and has not ambulated since July 2021. He reports his legs have been draining yellow clear and cloudy fluid.  In the ED Temp 97.5, heart rate 81, respiratory rate 16-18, blood pressure 132/76, satting at 97% No leukocytosis with white blood cell count of 7.4, hemoglobin 8.9 Chemistry panel reveals an elevated BUN and creatinine that are close to his baseline at 47 and 3.79 Glucose is 151 UA is not indicative of UTI CRP is 3.4, sed rate is 69 COVIDneg Blood cultures pending Official read of the MR ankle pending-ED provider reports the preliminary read was no concern for osteomyelitis EKG shows a heart rate of 97, QTc 477, PVCs Chest x-ray is negative Alk phos is elevated 131 Admission was requested to antibiotics and wound care to patient. Does not have reliable meansto follow-up outpatient  Assessment/Plan: Bilateral lower extremity cellulitis-ankle open wound, chronic venous stasisdermatitis 1. Likely a component of cellulitis and a component of venous stasis contributing to the open wounds on patient's leg; Failed outpatient antibiotics 2. Wound care consulted, also general surgery consulted for evaluation possible debridement. Appreciate Dr. Pamelia Hoit taken. Patient not interested in surgery or amputation 3. Patient was initiated on vancomycin, due to acute on chronic kidney disease, switch to linezolidinitially. 4. Came to the ER  earlier in April and was started on doxycycline, reports taking all of it but then having no way to follow-up with any provider secondary to transportation barriers, now legs worsening 5. Vancomycin started in the ED.Marland KitchenMarland KitchenDiscontinued 10/26/2020 6. Blood cultures>>>>neg to date 7. MRI ankle--no abscess, no osteomyelitis. Fatty atrophy of visualized musculature = chronic devervation 8. CRP 3.4, ESR 69 9. D/c zyvox. Start cefazolin anddoxy 5/4 10. Appreciate general surgery consult-->bx and culture done 5/3 11. SW/TOC assisting with setting up transport with [facility] transport and appointment with outpatient wound care clinic 12. Right heel repeat biopsy by Dr. Constance Haw b/c cannot r/o SCC on prior bx on 10/26/20  New Onset Atrial fibrillation -personally reviewed EKG--Afib, nonspecific T wave change -rate controlled -CHADS-VASc = 5 (CHF, HTN, Age, DM, ASVD) -continue metoprolol succinate -start apixaban 5/8 if no further procedures planned -5/7 Echo EF 55-60%; mild decrease RV fxn, RVSP 61.5 -TSH 2.028  Fall and Generalized weakness 1. Mechanical fall no LOC, did not hit head 2. Consult PT-->SNF-->pt refuses  DMII-uncontrolledwith hyperglycemia 1. CBG QA CHS, SSI coverage, 2. Takes 70/30 20-100 unitsat home 3. Continue Lantus 20 units 4. A1c:7.6 5. Carb modified diet Moderate protein cal malnutrition 1. Albumin 2.8 2. Counseled on the importance of nutrient dense diet 3. Glucerna shakes 4. Continue to monitor Medical non compliance 1. TOC consulted for possible PT at home?Any assistance with transportation for medical appointments  Acute on chronic CKD 4 -Trend BUN/creatinine closely, -baseline creatinine 3.7-4.0 -serum creatinine up to 4.78 -renal consult appreciated-discussed with Dr. Joelyn Oms -start IV lasix  Class 2 Obesity -BMI 36.91 -lifestyle modification  Atypical Chest pain -had CP 10/30/20 -personally reviewed EKG--sinus, nonspecific  T wave  change -troponin = 12>> -resolved     Status SW:[id number]  The patient will require care spanning > 2 midnights and should be moved to inpatient because:IV treatments appropriate due to intensity of illness or inability to take PO  Dispo: The patient is from:Home Anticipated d/c is KX:[facility] Patient currently is not medically stable to d/c. Difficult to place patient no        Family Communication:noFamily at bedside  Consultants:none  Code Status: FULL  DVT Prophylaxis: [place] Heparin    Procedures: As Listed in Progress Note Above  Antibiotics: zyvox 5/3>>5/4 Cefazolin 5/4>> Doxy 5/4>>     Subjective: Patient denies fevers, chills, headache, chest pain, dyspnea, nausea, vomiting, diarrhea, abdominal pain, dysuria, hematuria, hematochezia, and melena.   Objective: Vitals:   10/30/20 2056 10/31/20 0424 10/31/20 0616 10/31/20 0619  BP: 135/77   124/79  Pulse: 81   83  Resp: 18   20  Temp: 98.4 F (36.9 C)   98.1 F (36.7 C)  TempSrc: Oral   Oral  SpO2: 99%   93%  Weight:  (!) 139 kg 135.2 kg   Height:        Intake/Output Summary (Last 24 hours) at 10/31/2020 1325 Last data filed at 10/31/2020 0600 Gross per 24 hour  Intake 440 ml  Output 450 ml  Net -10 ml   Weight change:  Exam:   General:  Pt is alert, follows commands appropriately, not in acute distress  HEENT: No icterus, No thrush, No neck mass, [place]/AT  Cardiovascular: RRR, S1/S2, no rubs, no gallops  Respiratory: bibasilar rales. No wheeze  Abdomen: Soft/+BS, non tender, non distended, no guarding  Extremities: 3+ LE edema, No lymphangitis, No petechiae, No rashes, no synovitis   Data Reviewed: I have personally reviewed following labs and imaging studies Basic Metabolic Panel: Recent Labs  Lab 10/26/20 0530 10/27/20 0455 10/28/20 0542 10/29/20 0606 10/30/20 0534 10/31/20 0419  NA 138 137 136 136 134* 133*   K 4.1 4.1 4.2 4.7 4.8 5.0  CL 108 109 106 109 105 103  CO2 23 21* 22 21* 21* 21*  GLUCOSE 124* 85 82 106* 88 108*  BUN 47* 48* 49* 52* 59* 64*  CREATININE 3.80* 3.81* 3.93* 4.29* 4.73* 4.78*  CALCIUM 8.6* 8.3* 8.5* 8.3* 8.4* 8.1*  MG 2.0  --   --   --   --   --    Liver Function Tests: Recent Labs  Lab 10/25/20 1327 10/26/20 0530  AST 23 20  ALT 26 22  ALKPHOS 131* 124  BILITOT 0.7 0.9  PROT 7.2 6.8  ALBUMIN 2.8* 2.6*   No results for input(s): LIPASE, AMYLASE in the last 168 hours. No results for input(s): AMMONIA in the last 168 hours. Coagulation Profile: No results for input(s): INR, PROTIME in the last 168 hours. CBC: Recent Labs  Lab 10/25/20 1327 10/26/20 0530 10/27/20 0455 10/28/20 0542 10/29/20 0606 10/30/20 0534 10/31/20 0419  WBC 7.4   < > 6.8 6.6 7.1 6.7 7.0  NEUTROABS 6.0  --   --   --   --   --   --   HGB 8.9*   < > 8.5* 8.6* 8.3* 8.6* 8.4*  HCT 28.0*   < > 26.6* 26.6* 26.1* 27.0* 26.7*  MCV 85.1   < > 85.0 85.3 86.4 86.3 85.6  PLT 188   < > 186 180 174 205 194   < > = values in this interval not displayed.  Cardiac Enzymes: No results for input(s): CKTOTAL, CKMB, CKMBINDEX, TROPONINI in the last 168 hours. BNP: Invalid input(s): POCBNP CBG: Recent Labs  Lab 10/30/20 1107 10/30/20 1609 10/30/20 2151 10/31/20 0723 10/31/20 1135  GLUCAP 126* 123* 142* 97 115*   HbA1C: No results for input(s): HGBA1C in the last 72 hours. Urine analysis:    Component Value Date/Time   COLORURINE YELLOW 10/25/2020 1516   APPEARANCEUR CLEAR 10/25/2020 1516   APPEARANCEUR Clear 09/10/2020 1122   LABSPEC 1.010 10/25/2020 1516   PHURINE 6.0 10/25/2020 1516   GLUCOSEU 150 (A) 10/25/2020 1516   HGBUR SMALL (A) 10/25/2020 1516   BILIRUBINUR NEGATIVE 10/25/2020 1516   BILIRUBINUR Negative 09/10/2020 1122   KETONESUR NEGATIVE 10/25/2020 1516   PROTEINUR >=300 (A) 10/25/2020 1516   NITRITE NEGATIVE 10/25/2020 1516   LEUKOCYTESUR NEGATIVE 10/25/2020 1516    Sepsis Labs: @LABRCNTIP (procalcitonin:4,lacticidven:4) ) Recent Results (from the past 240 hour(s))  Aerobic Culture w Gram Stain (superficial specimen)     Status: Abnormal   Collection Time: 10/25/20  3:29 PM   Specimen: Skin, Cyst/Tag/Debridement; Wound  Result Value Ref Range Status   Specimen Description   Final    SKIN Performed at [facility] [address], [place], [place] [address]    Special Requests   Final    NONE Performed at [facility], [address] [place], [place] [address]    Gram Stain   Final    RARE WBC PRESENT, PREDOMINANTLY PMN RARE GRAM POSITIVE COCCI IN PAIRS RARE GRAM NEGATIVE RODS Performed at [facility], [place] [address] [place], [place] [address]    Culture MULTIPLE ORGANISMS PRESENT, NONE PREDOMINANT (A)  Final   Report Status 10/27/2020 FINAL  Final  Culture, blood (routine x 2)     Status: None   Collection Time: 10/25/20  3:59 PM   Specimen: BLOOD  Result Value Ref Range Status   Specimen Description BLOOD LEFT ARM  Final   Special Requests   Final    BOTTLES DRAWN AEROBIC AND ANAEROBIC Blood Culture adequate volume   Culture   Final    NO GROWTH 5 DAYS Performed at [facility], [address] [place], [place] [address]    Report Status 10/30/2020 FINAL  Final  Culture, blood (routine x 2)     Status: None   Collection Time: 10/25/20  3:59 PM   Specimen: BLOOD  Result Value Ref Range Status   Specimen Description BLOOD RIGHT ARM  Final   Special Requests   Final    BOTTLES DRAWN AEROBIC AND ANAEROBIC Blood Culture results may not be optimal due to an excessive volume of blood received in culture bottles   Culture   Final    NO GROWTH 5 DAYS Performed at [facility], [address], [place], [place] [address]    Report Status 10/30/2020 FINAL  Final  SARS CORONAVIRUS 2 ([place] 6-24 HRS) Nasopharyngeal Nasopharyngeal Swab     Status: None   Collection Time: 10/25/20  4:30 PM   Specimen: Nasopharyngeal Swab  Result  Value Ref Range Status   SARS Coronavirus 2 NEGATIVE NEGATIVE Final    Comment: (NOTE) SARS-CoV-2 target nucleic acids are NOT DETECTED.  The SARS-CoV-2 RNA is generally detectable in upper and lower respiratory specimens during the acute phase of infection. Negative results do not preclude SARS-CoV-2 infection, do not rule out co-infections with other pathogens, and should not be used as the sole basis for treatment or other patient management decisions. Negative  results must be combined with clinical observations, patient history, and epidemiological information. The expected result is Negative.  Fact Sheet for Patients: SugarRoll.be  Fact Sheet for Healthcare Providers: https://www.woods-mathews.com/  This test is not yet approved or cleared by the [place] FDA and  has been authorized for detection and/or diagnosis of SARS-CoV-2 by FDA under an Emergency Use Authorization (EUA). This EUA will remain  in effect (meaning this test can be used) for the duration of the COVID-19 declaration under Se ction 564(b)(1) of the Act, 21 U.S.C. section 360bbb-3(b)(1), unless the authorization is terminated or revoked sooner.  Performed at [facility], [place] [address] [place], [place] [address]   Aerobic Culture w Gram Stain (superficial specimen)     Status: Abnormal   Collection Time: 10/25/20  5:10 PM   Specimen: Ankle; Wound  Result Value Ref Range Status   Specimen Description   Final    ANKLE Performed at [facility], [address], [place], [place] [address]    Special Requests   Final    NONE Performed at [facility] [place], [place] [address]    Gram Stain   Final    RARE WBC PRESENT, PREDOMINANTLY PMN MODERATE GRAM POSITIVE COCCI IN PAIRS IN CLUSTERS MODERATE GRAM NEGATIVE RODS RARE GRAM POSITIVE RODS Performed at [facility], [place] [address], [place], [place] [address]    Culture MULTIPLE  ORGANISMS PRESENT, NONE PREDOMINANT (A)  Final   Report Status 10/28/2020 FINAL  Final  Fungus Culture With Stain     Status: None (Preliminary result)   Collection Time: 10/26/20  3:59 PM   Specimen: Ankle  Result Value Ref Range Status   Fungus Stain Final report  Final    Comment: (NOTE) Performed At: [facility] [place], [place] [id number] Rush Farmer MD WF:[id number]    Fungus (Mycology) Culture PENDING  Incomplete   Fungal Source ANKLE  Final    Comment: WOUND Performed at [facility], [address], [place], [place] [address]   Aerobic/Anaerobic Culture w Gram Stain (surgical/deep wound)     Status: Abnormal (Preliminary result)   Collection Time: 10/26/20  3:59 PM   Specimen: Tissue  Result Value Ref Range Status   Specimen Description TISSUE RIGHT ANKLE  Final   Special Requests NONE  Final   Gram Stain   Final    MODERATE WBC PRESENT,BOTH PMN AND MONONUCLEAR FEW SQUAMOUS EPITHELIAL CELLS PRESENT ABUNDANT GRAM NEGATIVE RODS MODERATE GRAM POSITIVE COCCI IN PAIRS MODERATE GRAM POSITIVE RODS Performed at [facility], [place] [address] [place], [place] [address]    Culture (A)  Final    MULTIPLE ORGANISMS PRESENT, NONE PREDOMINANT NO ANAEROBES ISOLATED; CULTURE IN PROGRESS FOR 5 DAYS    Report Status PENDING  Incomplete  Fungus Culture Result     Status: None   Collection Time: 10/26/20  3:59 PM  Result Value Ref Range Status   Result 1 Comment  Final    Comment: (NOTE) KOH/Calcofluor preparation:  no fungus observed. Performed At: [facility] [place], [place] [id number] Rush Farmer MD VO:[id number]      Scheduled Meds: . amLODipine  10 mg Oral Daily  . aspirin  325 mg Oral Daily  . cholecalciferol  1,000 Units Oral Daily  . collagenase   Topical Daily  . doxycycline  100 mg Oral Q12H  . epoetin alfa-epbx  8,000 Units Subcutaneous Q14 Days  . feeding supplement (GLUCERNA  SHAKE)  237 mL Oral TID BM   . heparin  5,000 Units Subcutaneous Q8H  . hydrALAZINE  100 mg Oral TID  . hydrocerin   Topical Daily  . insulin aspart  0-15 Units Subcutaneous TID WC  . insulin aspart  0-5 Units Subcutaneous QHS  . insulin detemir  20 Units Subcutaneous QHS  . metoprolol succinate  100 mg Oral QPM  . nystatin   Topical TID  . pravastatin  80 mg Oral QPM  . vitamin C  1,000 mg Oral Daily   Continuous Infusions: .  ceFAZolin (ANCEF) IV 1 g (10/31/20 0823)  . furosemide      Procedures/Studies: DG Chest 2 View  Result Date: 10/25/2020 CLINICAL DATA:  CHF with leg swelling and ulcer to right lateral malleolus. EXAM: CHEST - 2 VIEW COMPARISON:  09/26/20 FINDINGS: There is mild cardiac enlargement, stable from previous exam. No pleural effusion or interstitial edema identified. No airspace densities. IMPRESSION: No acute findings.  No evidence for congestive heart failure. Electronically Signed   By: Kerby Moors M.D.   On: 10/25/2020 14:21   DG Ankle Complete Right  Result Date: 10/25/2020 CLINICAL DATA:  CHF, lower extremity swelling, lateral malleolar ulcer with concern for infection, no reported injury EXAM: RIGHT ANKLE - COMPLETE 3+ VIEW COMPARISON:  09/26/2020 right ankle radiographs FINDINGS: Diffuse right ankle soft tissue swelling. Small soft tissue defect at the lateral malleolus compatible with reported ulcer. Subtle loss of cortical line at the lateral distal margin of the lateral malleolus suspicious for early bony erosion. No fracture. No subluxation. Severe tibiotalar osteoarthritis. Intact staples at the lateral aspect of the calcaneus. Partially visualized intact screw at the base of fifth metatarsal. No focal osseous lesions. IMPRESSION: Diffuse right ankle soft tissue swelling. Small soft tissue defect at the lateral malleolus compatible with reported ulcer. Subtle loss of cortical line at the lateral distal margin of the lateral malleolus, suspicious for early bony erosion due to  osteomyelitis. Consider MRI of the right ankle without IV contrast for further evaluation as clinically warranted. Electronically Signed   By: Ilona Sorrel M.D.   On: 10/25/2020 14:24   MR ANKLE RIGHT WO CONTRAST  Result Date: 10/26/2020 CLINICAL DATA:  Lateral right ankle ulceration EXAM: MRI OF THE RIGHT ANKLE WITHOUT CONTRAST TECHNIQUE: Multiplanar, multisequence MR imaging of the ankle was performed. No intravenous contrast was administered. COMPARISON:  X-ray 10/25/2020 FINDINGS: TENDONS Peroneal: Tendinosis with longitudinal split tear of the infra-malleolar aspect of the peroneus brevis tendon. Intact peroneus longus tendon. No tenosynovitis. Posteromedial: Intact tibialis posterior, flexor hallucis longus and flexor digitorum longus tendons. Anterior: Intact tibialis anterior, extensor hallucis longus and extensor digitorum longus tendons. Achilles: Intact. Plantar Fascia: Intact. LIGAMENTS Lateral: Anterior talofibular ligament and calcaneofibular ligaments are not clearly seen, and likely chronically torn. Posterior talofibular ligament is heterogeneous but intact. Intact anterior and posterior tibiofibular ligaments. Medial: Deltoid ligament appears chronically torn. Portions of the spring ligament complex are ill-defined. CARTILAGE Ankle Joint: Severe tibiotalar osteoarthritis with full-thickness cartilage loss most pronounced anteriorly. No joint effusion. Prominent osseous densities along the posterior aspect of the tibiotalar and subtalar joints, largest measuring up to 2.4 cm. Subtalar Joints/Sinus Tarsi: Moderate arthropathy of the posterior subtalar joint. No effusion. Preservation of the anatomic fat within the sinus tarsi. Bones: No acute fracture or dislocation. No bony erosion or cortical destruction. No marrow edema. Well healed calcaneal osteotomy. Metallic susceptibility artifact from hardware within the fifth metatarsal. Other: Soft tissue swelling with superficial ulceration overlying  the lateral malleolus. No organized fluid collection. Diffuse edema-like signal of the visualized foot and lower leg musculature. There is also fatty atrophy of the visualized musculature compatible with chronic denervation. IMPRESSION: 1. Soft tissue swelling with superficial ulceration overlying the lateral malleolus. No organized fluid collection. No evidence of osteomyelitis. 2. Severe tibiotalar osteoarthritis with prominent osseous densities along the posterior aspect of the tibiotalar and subtalar joints, largest measuring up to 2.4 cm. 3. Fatty atrophy and edema throughout the visualized foot and lower leg musculature, likely representing a combination of denervation changes and/or myositis. Findings are in agreement with the preliminary report provided by Dr. Francoise Ceo. Preliminary report was called to the ordering provider Eustaquio Maize, PA at 5:54 p.m. on 10/25/2020 by Dr. Francoise Ceo. Electronically Signed   By: Davina Poke D.O.   On: 10/26/2020 08:32   ECHOCARDIOGRAM COMPLETE  Result Date: 10/30/2020    ECHOCARDIOGRAM REPORT   Patient Name:   TAMARCUS CONDIE Date of Exam: 10/30/2020 Medical Rec #:  [id number]   Height:       73.0 in Accession #:    [id number]  Weight:       279.8 lb Date of Birth:  09-Sep-1955    BSA:          2.482 m Patient Age:    41 years    BP:           115/71 mmHg Patient Gender: M           HR:           81 bpm. Exam Location:  [name] Procedure: 2D Echo, Cardiac Doppler and Color Doppler Indications:    I48.0 Paroxysmal atrial fibrillation  History:        Patient has prior history of Echocardiogram examinations, most                 recent 02/24/2019. Risk Factors:Hypertension and Diabetes. CKD.  Sonographer:    Jonelle Sidle Dance Referring Phys: [phone] Sorah Falkenstein  Sonographer Comments: Technically difficult study due to poor echo windows. IMPRESSIONS  1. Left ventricular ejection fraction, by estimation, is 55 to 60%. The left ventricle has normal function. Left ventricular endocardial  border not optimally defined to evaluate regional wall motion. There is moderate left ventricular hypertrophy. Left ventricular diastolic parameters are indeterminate.  2. Right ventricular systolic function is mildly reduced. The right ventricular size is moderately enlarged. There is severely elevated pulmonary artery systolic pressure. The estimated right ventricular systolic pressure is 44.9 mmHg.  3. Left atrial size was mildly dilated.  4. Right atrial size was moderately dilated.  5. The mitral valve is normal in structure. Mild mitral valve regurgitation. No evidence of mitral stenosis.  6. The aortic valve is grossly normal. Aortic valve regurgitation is not visualized. No aortic stenosis is present.  7. The inferior vena cava is dilated in size with <50% respiratory variability, suggesting right atrial pressure of 15 mmHg. FINDINGS  Left Ventricle: Left ventricular ejection fraction, by estimation, is 55 to 60%. The left ventricle has normal function. Left ventricular endocardial border not optimally defined to evaluate regional wall motion. The left ventricular internal cavity size was normal in size. There is moderate left ventricular hypertrophy. Left ventricular diastolic parameters are indeterminate. Right Ventricle: The right ventricular size is moderately enlarged. No increase in right ventricular wall thickness. Right ventricular systolic function is mildly reduced. There is severely elevated pulmonary artery systolic pressure. The tricuspid regurgitant velocity is 3.41 m/s, and with an assumed right  atrial pressure of 15 mmHg, the estimated right ventricular systolic pressure is 25.8 mmHg. Left Atrium: Left atrial size was mildly dilated. Right Atrium: Right atrial size was moderately dilated. Pericardium: There is no evidence of pericardial effusion. Mitral Valve: The mitral valve is normal in structure. Mild mitral valve regurgitation. No evidence of mitral valve stenosis. Tricuspid Valve: The  tricuspid valve is normal in structure. Tricuspid valve regurgitation is not demonstrated. No evidence of tricuspid stenosis. Aortic Valve: The aortic valve is grossly normal. Aortic valve regurgitation is not visualized. No aortic stenosis is present. Pulmonic Valve: The pulmonic valve was normal in structure. Pulmonic valve regurgitation is not visualized. No evidence of pulmonic stenosis. Aorta: The aortic root is normal in size and structure. Venous: The inferior vena cava is dilated in size with less than 50% respiratory variability, suggesting right atrial pressure of 15 mmHg. IAS/Shunts: The interatrial septum was not well visualized.  LEFT VENTRICLE PLAX 2D LVIDd:         4.67 cm  Diastology LVIDs:         3.08 cm  LV e' medial:    6.57 cm/s LV PW:         1.67 cm  LV E/e' medial:  21.6 LV IVS:        1.37 cm  LV e' lateral:   7.36 cm/s LVOT diam:     2.00 cm  LV E/e' lateral: 19.3 LV SV:         54 LV SV Index:   22 LVOT Area:     3.14 cm  RIGHT VENTRICLE RV Basal diam:  3.79 cm RV Mid diam:    3.04 cm RV S prime:     11.10 cm/s TAPSE (M-mode): 1.6 cm LEFT ATRIUM             Index       RIGHT ATRIUM           Index LA diam:        5.00 cm 2.01 cm/m  RA Area:     29.80 cm LA Vol (A2C):   98.6 ml 39.73 ml/m RA Volume:   108.00 ml 43.52 ml/m LA Vol (A4C):   96.5 ml 38.89 ml/m LA Biplane Vol: 99.0 ml 39.89 ml/m  AORTIC VALVE LVOT Vmax:   80.90 cm/s LVOT Vmean:  55.700 cm/s LVOT VTI:    0.172 m  AORTA Ao Root diam: 3.60 cm Ao Asc diam:  3.20 cm MITRAL VALVE                TRICUSPID VALVE MV Area (PHT): 2.96 cm     TR Peak grad:   46.5 mmHg MV Decel Time: 257 msec     TR Vmax:        341.00 cm/s MV E velocity: 142.00 cm/s                             SHUNTS                             Systemic VTI:  0.17 m                             Systemic Diam: 2.00 cm Cherlynn Kaiser MD Electronically signed by Cherlynn Kaiser MD Signature Date/Time: 10/30/2020/4:12:09 PM    Final     Orson Eva,  DO  [facility]  If 7PM-7AM, please contact night-coverage [email or web address] Password [email or web address] 10/31/2020, 1:25 PM   LOS: 3 days

## 2020-10-31 NOTE — Progress Notes (Signed)
Noted weight gain this am. Patient noted to be on torsemide at home. Notified Hospitalist no new orders at this time.

## 2020-10-31 NOTE — Progress Notes (Signed)
Chart reviewed, patient seen.  Creatinine with slight worsening overnight, K5.0, BUN 64.  0.8 L urine output.  Patient is on room air with stable vital signs.  Given profound hypervolemia leading to this admission and fairly stable GFR, will try twice daily furosemide 120 mg IV.  A list signout a list of signout,

## 2020-11-01 ENCOUNTER — Telehealth (HOSPITAL_COMMUNITY): Payer: Self-pay | Admitting: Physical Therapy

## 2020-11-01 DIAGNOSIS — I878 Other specified disorders of veins: Secondary | ICD-10-CM | POA: Diagnosis not present

## 2020-11-01 DIAGNOSIS — L03119 Cellulitis of unspecified part of limb: Secondary | ICD-10-CM | POA: Diagnosis not present

## 2020-11-01 DIAGNOSIS — N184 Chronic kidney disease, stage 4 (severe): Secondary | ICD-10-CM | POA: Diagnosis not present

## 2020-11-01 LAB — BASIC METABOLIC PANEL
Anion gap: 9 (ref 5–15)
BUN: 71 mg/dL — ABNORMAL HIGH (ref 8–23)
CO2: 21 mmol/L — ABNORMAL LOW (ref 22–32)
Calcium: 8.3 mg/dL — ABNORMAL LOW (ref 8.9–10.3)
Chloride: 104 mmol/L (ref 98–111)
Creatinine, Ser: 5.19 mg/dL — ABNORMAL HIGH (ref 0.61–1.24)
GFR, Estimated: 11 mL/min — ABNORMAL LOW (ref >60)
Glucose, Bld: 91 mg/dL (ref 70–99)
Potassium: 4.6 mmol/L (ref 3.5–5.1)
Sodium: 134 mmol/L — ABNORMAL LOW (ref 135–145)

## 2020-11-01 LAB — AEROBIC/ANAEROBIC CULTURE W GRAM STAIN (SURGICAL/DEEP WOUND)

## 2020-11-01 LAB — GLUCOSE, CAPILLARY
Glucose-Capillary: 82 mg/dL (ref 70–99)
Glucose-Capillary: 145 mg/dL — ABNORMAL HIGH (ref 70–99)
Glucose-Capillary: 110 mg/dL — ABNORMAL HIGH (ref 70–99)
Glucose-Capillary: 154 mg/dL — ABNORMAL HIGH (ref 70–99)

## 2020-11-01 LAB — CBC
HCT: 26.6 % — ABNORMAL LOW (ref 39.0–52.0)
Hemoglobin: 8.4 g/dL — ABNORMAL LOW (ref 13.0–17.0)
MCH: 26.8 pg (ref 26.0–34.0)
MCHC: 31.6 g/dL (ref 30.0–36.0)
MCV: 85 fL (ref 80.0–100.0)
Platelets: 215 10*3/uL (ref 150–400)
RBC: 3.13 MIL/uL — ABNORMAL LOW (ref 4.22–5.81)
RDW: 17.1 % — ABNORMAL HIGH (ref 11.5–15.5)
WBC: 5.9 10*3/uL (ref 4.0–10.5)
nRBC: 0 % (ref 0.0–0.2)

## 2020-11-01 LAB — MAGNESIUM: Magnesium: 2.2 mg/dL (ref 1.7–2.4)

## 2020-11-01 MED ORDER — POLYETHYLENE GLYCOL 3350 17 G PO PACK
17.0000 g | PACK | Freq: Every day | ORAL | Status: DC
  Administered 2020-11-01 – 2020-11-05 (×3): 17 g via ORAL
  Filled 2020-11-01 (×4): qty 1

## 2020-11-01 MED ORDER — SENNA 8.6 MG PO TABS
2.0000 | ORAL_TABLET | Freq: Every day | ORAL | Status: DC
  Administered 2020-11-01 – 2020-11-05 (×4): 17.2 mg via ORAL
  Filled 2020-11-01 (×5): qty 2

## 2020-11-01 MED ORDER — FUROSEMIDE 10 MG/ML IJ SOLN
INTRAMUSCULAR | Status: AC
  Filled 2020-11-01: qty 20

## 2020-11-01 NOTE — Progress Notes (Signed)
Patient ID: Danny [name], male   DOB: Oct 24, 1955, 65 y.o.   MRN: [id number] S: Feels better today.  NO new complaints O:BP 139/82   Pulse (!) 103   Temp 98.3 F (36.8 C) (Oral)   Resp 20   Ht 6\' 1"  (1.854 m)   Wt 133 kg   SpO2 95%   BMI 38.68 kg/m   Intake/Output Summary (Last 24 hours) at 11/01/2020 1012 Last data filed at 11/01/2020 0900 Gross per 24 hour  Intake 480 ml  Output 1800 ml  Net -1320 ml   Intake/Output: I/O last 3 completed shifts: In: 440 [P.O.:240; IV Piggyback:200] Out: 1850 [Urine:1850]  Intake/Output this shift:  Total I/O In: 240 [P.O.:240] Out: 400 [Urine:400] Weight change: -6 kg Gen: NAD CVS: RRR Resp: cta Abd: obese, +BS, soft, NT Ext: 2-3+ edema to presacral area  Recent Labs  Lab 10/25/20 1327 10/26/20 0530 10/27/20 0455 10/28/20 0542 10/29/20 0606 10/30/20 0534 10/31/20 0419 11/01/20 0442  NA 137 138 137 136 136 134* 133* 134*  K 4.2 4.1 4.1 4.2 4.7 4.8 5.0 4.6  CL 107 108 109 106 109 105 103 104  CO2 22 23 21* 22 21* 21* 21* 21*  GLUCOSE 151* 124* 85 82 106* 88 108* 91  BUN 47* 47* 48* 49* 52* 59* 64* 71*  CREATININE 3.79* 3.80* 3.81* 3.93* 4.29* 4.73* 4.78* 5.19*  ALBUMIN 2.8* 2.6*  --   --   --   --   --   --   CALCIUM 8.6* 8.6* 8.3* 8.5* 8.3* 8.4* 8.1* 8.3*  AST 23 20  --   --   --   --   --   --   ALT 26 22  --   --   --   --   --   --    Liver Function Tests: Recent Labs  Lab 10/25/20 1327 10/26/20 0530  AST 23 20  ALT 26 22  ALKPHOS 131* 124  BILITOT 0.7 0.9  PROT 7.2 6.8  ALBUMIN 2.8* 2.6*   No results for input(s): LIPASE, AMYLASE in the last 168 hours. No results for input(s): AMMONIA in the last 168 hours. CBC: Recent Labs  Lab 10/25/20 1327 10/26/20 0530 10/28/20 0542 10/29/20 0606 10/30/20 0534 10/31/20 0419 11/01/20 0442  WBC 7.4   < > 6.6 7.1 6.7 7.0 5.9  NEUTROABS 6.0  --   --   --   --   --   --   HGB 8.9*   < > 8.6* 8.3* 8.6* 8.4* 8.4*  HCT 28.0*   < > 26.6* 26.1* 27.0* 26.7* 26.6*  MCV 85.1   <  > 85.3 86.4 86.3 85.6 85.0  PLT 188   < > 180 174 205 194 215   < > = values in this interval not displayed.   Cardiac Enzymes: No results for input(s): CKTOTAL, CKMB, CKMBINDEX, TROPONINI in the last 168 hours. CBG: Recent Labs  Lab 10/30/20 2151 10/31/20 0723 10/31/20 1135 10/31/20 2041 11/01/20 0716  GLUCAP 142* 97 115* 150* 82    Iron Studies: No results for input(s): IRON, TIBC, TRANSFERRIN, FERRITIN in the last 72 hours. Studies/Results: ECHOCARDIOGRAM COMPLETE  Result Date: 10/30/2020    ECHOCARDIOGRAM REPORT   Patient Name:   Danny [name] Date of Exam: 10/30/2020 Medical Rec #:  [id number]   Height:       73.0 in Accession #:    [id number]  Weight:       279.8 lb Date of Birth:  March 20, 1956    BSA:          2.482 m Patient Age:    35 years    BP:           115/71 mmHg Patient Gender: M           HR:           81 bpm. Exam Location:  [name] Procedure: 2D Echo, Cardiac Doppler and Color Doppler Indications:    I48.0 Paroxysmal atrial fibrillation  History:        Patient has prior history of Echocardiogram examinations, most                 recent 02/24/2019. Risk Factors:Hypertension and Diabetes. CKD.  Sonographer:    Jonelle Sidle Dance Referring Phys: [phone] DAVID TAT  Sonographer Comments: Technically difficult study due to poor echo windows. IMPRESSIONS  1. Left ventricular ejection fraction, by estimation, is 55 to 60%. The left ventricle has normal function. Left ventricular endocardial border not optimally defined to evaluate regional wall motion. There is moderate left ventricular hypertrophy. Left ventricular diastolic parameters are indeterminate.  2. Right ventricular systolic function is mildly reduced. The right ventricular size is moderately enlarged. There is severely elevated pulmonary artery systolic pressure. The estimated right ventricular systolic pressure is 16.0 mmHg.  3. Left atrial size was mildly dilated.  4. Right atrial size was moderately dilated.  5. The mitral valve is  normal in structure. Mild mitral valve regurgitation. No evidence of mitral stenosis.  6. The aortic valve is grossly normal. Aortic valve regurgitation is not visualized. No aortic stenosis is present.  7. The inferior vena cava is dilated in size with <50% respiratory variability, suggesting right atrial pressure of 15 mmHg. FINDINGS  Left Ventricle: Left ventricular ejection fraction, by estimation, is 55 to 60%. The left ventricle has normal function. Left ventricular endocardial border not optimally defined to evaluate regional wall motion. The left ventricular internal cavity size was normal in size. There is moderate left ventricular hypertrophy. Left ventricular diastolic parameters are indeterminate. Right Ventricle: The right ventricular size is moderately enlarged. No increase in right ventricular wall thickness. Right ventricular systolic function is mildly reduced. There is severely elevated pulmonary artery systolic pressure. The tricuspid regurgitant velocity is 3.41 m/s, and with an assumed right atrial pressure of 15 mmHg, the estimated right ventricular systolic pressure is 10.9 mmHg. Left Atrium: Left atrial size was mildly dilated. Right Atrium: Right atrial size was moderately dilated. Pericardium: There is no evidence of pericardial effusion. Mitral Valve: The mitral valve is normal in structure. Mild mitral valve regurgitation. No evidence of mitral valve stenosis. Tricuspid Valve: The tricuspid valve is normal in structure. Tricuspid valve regurgitation is not demonstrated. No evidence of tricuspid stenosis. Aortic Valve: The aortic valve is grossly normal. Aortic valve regurgitation is not visualized. No aortic stenosis is present. Pulmonic Valve: The pulmonic valve was normal in structure. Pulmonic valve regurgitation is not visualized. No evidence of pulmonic stenosis. Aorta: The aortic root is normal in size and structure. Venous: The inferior vena cava is dilated in size with less than  50% respiratory variability, suggesting right atrial pressure of 15 mmHg. IAS/Shunts: The interatrial septum was not well visualized.  LEFT VENTRICLE PLAX 2D LVIDd:         4.67 cm  Diastology LVIDs:         3.08 cm  LV e' medial:    6.57 cm/s LV PW:  1.67 cm  LV E/e' medial:  21.6 LV IVS:        1.37 cm  LV e' lateral:   7.36 cm/s LVOT diam:     2.00 cm  LV E/e' lateral: 19.3 LV SV:         54 LV SV Index:   22 LVOT Area:     3.14 cm  RIGHT VENTRICLE RV Basal diam:  3.79 cm RV Mid diam:    3.04 cm RV S prime:     11.10 cm/s TAPSE (M-mode): 1.6 cm LEFT ATRIUM             Index       RIGHT ATRIUM           Index LA diam:        5.00 cm 2.01 cm/m  RA Area:     29.80 cm LA Vol (A2C):   98.6 ml 39.73 ml/m RA Volume:   108.00 ml 43.52 ml/m LA Vol (A4C):   96.5 ml 38.89 ml/m LA Biplane Vol: 99.0 ml 39.89 ml/m  AORTIC VALVE LVOT Vmax:   80.90 cm/s LVOT Vmean:  55.700 cm/s LVOT VTI:    0.172 m  AORTA Ao Root diam: 3.60 cm Ao Asc diam:  3.20 cm MITRAL VALVE                TRICUSPID VALVE MV Area (PHT): 2.96 cm     TR Peak grad:   46.5 mmHg MV Decel Time: 257 msec     TR Vmax:        341.00 cm/s MV E velocity: 142.00 cm/s                             SHUNTS                             Systemic VTI:  0.17 m                             Systemic Diam: 2.00 cm Cherlynn Kaiser MD Electronically signed by Cherlynn Kaiser MD Signature Date/Time: 10/30/2020/4:12:09 PM    Final    . aspirin  325 mg Oral Daily  . cholecalciferol  1,000 Units Oral Daily  . collagenase   Topical Daily  . doxycycline  100 mg Oral Q12H  . epoetin alfa-epbx  8,000 Units Subcutaneous Q14 Days  . feeding supplement (GLUCERNA SHAKE)  237 mL Oral TID BM  . heparin  5,000 Units Subcutaneous Q8H  . hydrALAZINE  100 mg Oral TID  . hydrocerin   Topical Daily  . insulin aspart  0-15 Units Subcutaneous TID WC  . insulin aspart  0-5 Units Subcutaneous QHS  . insulin detemir  20 Units Subcutaneous QHS  . metoprolol succinate  25 mg Oral QPM   . nystatin   Topical TID  . pravastatin  80 mg Oral QPM  . vitamin C  1,000 mg Oral Daily    BMET    Component Value Date/Time   NA 134 (L) 11/01/2020 0442   K 4.6 11/01/2020 0442   CL 104 11/01/2020 0442   CO2 21 (L) 11/01/2020 0442   GLUCOSE 91 11/01/2020 0442   BUN 71 (H) 11/01/2020 0442   CREATININE 5.19 (H) 11/01/2020 0442   CALCIUM 8.3 (L) 11/01/2020 0442   CALCIUM 8.8 03/13/2019 0700  GFRNONAA 11 (L) 11/01/2020 0442   GFRAA 17 (L) 03/30/2020 0620   CBC    Component Value Date/Time   WBC 5.9 11/01/2020 0442   RBC 3.13 (L) 11/01/2020 0442   HGB 8.4 (L) 11/01/2020 0442   HCT 26.6 (L) 11/01/2020 0442   PLT 215 11/01/2020 0442   MCV 85.0 11/01/2020 0442   MCH 26.8 11/01/2020 0442   MCHC 31.6 11/01/2020 0442   RDW 17.1 (H) 11/01/2020 0442   LYMPHSABS 0.6 (L) 10/25/2020 1327   MONOABS 0.5 10/25/2020 1327   EOSABS 0.3 10/25/2020 1327   BASOSABS 0.0 10/25/2020 1327    Assessment per Dr. Joelyn Oms: [phone] with progressive proteinuric CKD, stage IV, admitted with bilateral worsening lower extremity edema with accompanying cellulitis/dermatitis and progressive edema into the trunk.  I think the change in kidney function over the past 24 hours might be still within his usual range, I am not sure how much of an acute component there is here.  He certainly needs his volume status improved with at all possible, but I like to see his kidney function level off first.  I do not think we need a renal ultrasound unless things continue to worsen.  I think that is medication management has been reasonable to try to avoid any further injury.   Assessment/Plan:  1. AKI/CKD stage IV- pt with significant proteinuria and progressive CKD thought to be due to DM.  Followed by Dr. Theador Hawthorne at [facility] and had been referred to [facility] in March, however he did not keep appointment.  Baseline Scr 2.7-3.96.  BUN/Cr have been climbing with diuresis and likely component of cardiorenal syndrome.  Discussed  possible need to initiate HD during this hospitalization.  Will continue with IV lasix and follow UOP and BUN/Cr.  Currently without uremic symptoms. 2. Bilateral lower extremity cellulitis with open wound on ankle and chronic venous stasis dermatitis.  Wound care following.  Biopsy taken and started on IV abx. 3. New onset atrial fibrillation - on metoprolol and apixaban per primary svc 4. DM - per primary 5. MOrbid obesity 6. Moderate protein malnutrition - per primary. 7. Anemia of CKD - transfuse prn and will likely require ESA.  [name], MD [employer] [phone]

## 2020-11-01 NOTE — Progress Notes (Signed)
PROGRESS NOTE  Danny [name] YQM:[id number] DOB: [birth year] DOA: 10/25/2020 PCP: Celene Squibb, MD  Brief History: 65 y/owith a history of hypertension, diabetes mellitus type 2, CKD stage III, psoriasis, venous stasis dermatitis, lymphedema presenting with 2-weekhistory of his legs edema, pain, erythema and becoming weak. He reports he was placed on a 2 week course of doxy in the beginning of April with some improvement, but his legs have worsened since finishing doxy 1-2 weeks prior to this admission. He states he is WC bound and has not ambulated since July 2021. He reports his legs have been draining yellow clear and cloudy fluid.  In the ED Temp 97.5, heart rate 81, respiratory rate 16-18, blood pressure 132/76, satting at 97% No leukocytosis with white blood cell count of 7.4, hemoglobin 8.9 Chemistry panel reveals an elevated BUN and creatinine that are close to his baseline at 47 and 3.79 Glucose is 151 UA is not indicative of UTI CRP is 3.4, sed rate is 69 COVIDneg Blood cultures pending Official read of the MR ankle pending-ED provider reports the preliminary read was no concern for osteomyelitis EKG shows a heart rate of 97, QTc 477, PVCs Chest x-ray is negative Alk phos is elevated 131 Admission was requested to antibiotics and wound care to patient. Does not have reliable meansto follow-up outpatient  Assessment/Plan: Bilateral lower extremity cellulitis-ankle open wound, chronic venous stasisdermatitis 1. Likely a component of cellulitis and a component of venous stasis contributing to the open wounds on patient's leg; Failed outpatient antibiotics 2. Wound care consulted, also general surgery consulted for evaluation possible debridement. Appreciate Dr. Pamelia Hoit taken. Patient not interested in surgery or amputation 3. Patient was initiated on vancomycin, due to acute on chronic kidney disease, switch to linezolidinitially. 4. Came to the ER  earlier in April and was started on doxycycline, reports taking all of it but then having no way to follow-up with any provider secondary to transportation barriers, now legs worsening 5. Vancomycin started in the ED.Marland KitchenMarland KitchenDiscontinued 10/26/2020 6. Blood cultures>>>>neg to date 7. MRI ankle--no abscess, no osteomyelitis. Fatty atrophy of visualized musculature = chronic devervation 8. CRP 3.4, ESR 69 9. D/c zyvox. Start cefazolin anddoxy 5/4 10. Appreciate general surgery consult-->bx and culture done 5/3 11. SW/TOC assisting with setting up transport with [facility]and appointment with outpatient wound care clinic 12. Right heel repeat biopsy by Dr. Constance Haw b/c cannot r/o SCC on prior bx on 10/26/20  New Onset Atrial fibrillation -personally reviewed EKG--Afib, nonspecific T wave change -rate controlled -CHADS-VASc = 5 (CHF, HTN, Age, DM, ASVD) -continue metoprolol succinate -start apixabanif no further procedures planned -5/7 Echo EF 55-60%; mild decrease RV fxn, RVSP 61.5 -TSH 2.028  Acute on chronicCKD 4 -Trend BUN/creatinine--uptrending -appear volume overloaded -baseline creatinine 3.7-4.0 -serum creatinine up to 4.78>>5.19 -renal consult appreciated-discussed with Dr. Joelyn Oms -continue IV lasix -suspect he may to need to be initiated on HD this hospitalization  Fall and Generalized weakness 1. Mechanical fall no LOC, did not hit head 2. Consult PT-->SNF-->pt refuses  DMII-uncontrolledwith hyperglycemia 1. CBG QA CHS, SSI coverage, 2. Takes 70/30 20-100 unitsat home 3. Continue Levemir 20 units 4. A1c:7.6 5. Carb modified diet Moderate protein cal malnutrition 1. Albumin 2.8 2. Counseled on the importance of nutrient dense diet 3. Glucerna shakes 4. Continue to monitor Medical non compliance 1. TOC consulted for possible PT at home?Any assistance with transportation for medical appointments    Class 2 Obesity -BMI 36.91 -lifestyle  modification  Atypical Chest pain -had CP 10/30/20 -personally reviewed EKG--sinus, nonspecific T wave change -troponin = 12>> -resolved     Status WU:[id number]  The patient will require care spanning > 2 midnights and should be moved to inpatient because:IV treatments appropriate due to intensity of illness or inability to take PO  Dispo: The patient is from:Home Anticipated d/c is GN:FAOZ Patient currently is not medically stable to d/c. Difficult to place patient no        Family Communication:noFamily at bedside  Consultants:none  Code Status: FULL  DVT Prophylaxis: [place] Heparin    Procedures: As Listed in Progress Note Above  Antibiotics: zyvox 5/3>>5/4 Cefazolin 5/4>> Doxy 5/4>>    Subjective: Patient denies fevers, chills, headache, chest pain, dyspnea, nausea, vomiting, diarrhea, abdominal pain, dysuria, hematuria, hematochezia, and melena.   Objective: Vitals:   10/31/20 2043 11/01/20 0423 11/01/20 0500 11/01/20 0944  BP: (!) 147/84 126/71  139/82  Pulse: 82 (!) 103    Resp: 16 20    Temp: 97.7 F (36.5 C) 98.3 F (36.8 C)    TempSrc: Oral Oral    SpO2: 96% 95%    Weight:   133 kg   Height:        Intake/Output Summary (Last 24 hours) at 11/01/2020 1119 Last data filed at 11/01/2020 0900 Gross per 24 hour  Intake 480 ml  Output 1800 ml  Net -1320 ml   Weight change: -6 kg Exam:   General:  Pt is alert, follows commands appropriately, not in acute distress  HEENT: No icterus, No thrush, No neck mass, [place]/AT  Cardiovascular: IRRR, S1/S2, no rubs, no gallops  Respiratory: bibasilar crackles. No wheeze  Abdomen: Soft/+BS, non tender, non distended, no guarding  Extremities: 2+LE edema, No lymphangitis, No petechiae, No rashes, no synovitis   Data Reviewed: I have personally reviewed following labs and imaging studies Basic Metabolic Panel: Recent Labs  Lab  10/26/20 0530 10/27/20 0455 10/28/20 0542 10/29/20 0606 10/30/20 0534 10/31/20 0419 11/01/20 0442  NA 138   < > 136 136 134* 133* 134*  K 4.1   < > 4.2 4.7 4.8 5.0 4.6  CL 108   < > 106 109 105 103 104  CO2 23   < > 22 21* 21* 21* 21*  GLUCOSE 124*   < > 82 106* 88 108* 91  BUN 47*   < > 49* 52* 59* 64* 71*  CREATININE 3.80*   < > 3.93* 4.29* 4.73* 4.78* 5.19*  CALCIUM 8.6*   < > 8.5* 8.3* 8.4* 8.1* 8.3*  MG 2.0  --   --   --   --   --  2.2   < > = values in this interval not displayed.   Liver Function Tests: Recent Labs  Lab 10/25/20 1327 10/26/20 0530  AST 23 20  ALT 26 22  ALKPHOS 131* 124  BILITOT 0.7 0.9  PROT 7.2 6.8  ALBUMIN 2.8* 2.6*   No results for input(s): LIPASE, AMYLASE in the last 168 hours. No results for input(s): AMMONIA in the last 168 hours. Coagulation Profile: No results for input(s): INR, PROTIME in the last 168 hours. CBC: Recent Labs  Lab 10/25/20 1327 10/26/20 0530 10/28/20 0542 10/29/20 0606 10/30/20 0534 10/31/20 0419 11/01/20 0442  WBC 7.4   < > 6.6 7.1 6.7 7.0 5.9  NEUTROABS 6.0  --   --   --   --   --   --   HGB 8.9*   < >  8.6* 8.3* 8.6* 8.4* 8.4*  HCT 28.0*   < > 26.6* 26.1* 27.0* 26.7* 26.6*  MCV 85.1   < > 85.3 86.4 86.3 85.6 85.0  PLT 188   < > 180 174 205 194 215   < > = values in this interval not displayed.   Cardiac Enzymes: No results for input(s): CKTOTAL, CKMB, CKMBINDEX, TROPONINI in the last 168 hours. BNP: Invalid input(s): POCBNP CBG: Recent Labs  Lab 10/30/20 2151 10/31/20 0723 10/31/20 1135 10/31/20 2041 11/01/20 0716  GLUCAP 142* 97 115* 150* 82   HbA1C: No results for input(s): HGBA1C in the last 72 hours. Urine analysis:    Component Value Date/Time   COLORURINE YELLOW 10/25/2020 1516   APPEARANCEUR CLEAR 10/25/2020 1516   APPEARANCEUR Clear 09/10/2020 1122   LABSPEC 1.010 10/25/2020 1516   PHURINE 6.0 10/25/2020 1516   GLUCOSEU 150 (A) 10/25/2020 1516   HGBUR SMALL (A) 10/25/2020 1516    BILIRUBINUR NEGATIVE 10/25/2020 1516   BILIRUBINUR Negative 09/10/2020 1122   KETONESUR NEGATIVE 10/25/2020 1516   PROTEINUR >=300 (A) 10/25/2020 1516   NITRITE NEGATIVE 10/25/2020 1516   LEUKOCYTESUR NEGATIVE 10/25/2020 1516   Sepsis Labs: _0 (procalcitonin:4,lacticidven:4) ) Recent Results (from the past 240 hour(s))  Aerobic Culture w Gram Stain (superficial specimen)     Status: Abnormal   Collection Time: 10/25/20  3:29 PM   Specimen: Skin, Cyst/Tag/Debridement; Wound  Result Value Ref Range Status   Specimen Description   Final    SKIN Performed at [facility] [address] [place], [place] [address]    Special Requests   Final    NONE Performed at [facility], [address] [place], [place] [address]    Gram Stain   Final    RARE WBC PRESENT, PREDOMINANTLY PMN RARE GRAM POSITIVE COCCI IN PAIRS RARE GRAM NEGATIVE RODS Performed at [facility], [place] [address] [place], [place] [address]    Culture MULTIPLE ORGANISMS PRESENT, NONE PREDOMINANT (A)  Final   Report Status 10/27/2020 FINAL  Final  Culture, blood (routine x 2)     Status: None   Collection Time: 10/25/20  3:59 PM   Specimen: BLOOD  Result Value Ref Range Status   Specimen Description BLOOD LEFT ARM  Final   Special Requests   Final    BOTTLES DRAWN AEROBIC AND ANAEROBIC Blood Culture adequate volume   Culture   Final    NO GROWTH 5 DAYS Performed at [facility], [address], [place], [place] [address]    Report Status 10/30/2020 FINAL  Final  Culture, blood (routine x 2)     Status: None   Collection Time: 10/25/20  3:59 PM   Specimen: BLOOD  Result Value Ref Range Status   Specimen Description BLOOD RIGHT ARM  Final   Special Requests   Final    BOTTLES DRAWN AEROBIC AND ANAEROBIC Blood Culture results may not be optimal due to an excessive volume of blood received in culture bottles   Culture   Final    NO GROWTH 5 DAYS Performed at [facility], [address]  [place], [place] [address]    Report Status 10/30/2020 FINAL  Final  SARS CORONAVIRUS 2 (Jamine [place] 6-24 HRS) Nasopharyngeal Nasopharyngeal Swab     Status: None   Collection Time: 10/25/20  4:30 PM   Specimen: Nasopharyngeal Swab  Result Value Ref Range Status   SARS Coronavirus 2 NEGATIVE NEGATIVE Final    Comment: (NOTE) SARS-CoV-2 target nucleic acids  are NOT DETECTED.  The SARS-CoV-2 RNA is generally detectable in upper and lower respiratory specimens during the acute phase of infection. Negative results do not preclude SARS-CoV-2 infection, do not rule out co-infections with other pathogens, and should not be used as the sole basis for treatment or other patient management decisions. Negative results must be combined with clinical observations, patient history, and epidemiological information. The expected result is Negative.  Fact Sheet for Patients: SugarRoll.be  Fact Sheet for Healthcare Providers: https://www.woods-mathews.com/  This test is not yet approved or cleared by the [place] FDA and  has been authorized for detection and/or diagnosis of SARS-CoV-2 by FDA under an Emergency Use Authorization (EUA). This EUA will remain  in effect (meaning this test can be used) for the duration of the COVID-19 declaration under Se ction 564(b)(1) of the Act, 21 U.S.C. section 360bbb-3(b)(1), unless the authorization is terminated or revoked sooner.  Performed at [facility], [place] [address] [place], [place] [address]   Aerobic Culture w Gram Stain (superficial specimen)     Status: Abnormal   Collection Time: 10/25/20  5:10 PM   Specimen: Ankle; Wound  Result Value Ref Range Status   Specimen Description   Final    ANKLE Performed at [facility], [address], [place], [place] [address]    Special Requests   Final    NONE Performed at [place] [place], [place] [address]    Gram Stain   Final    RARE WBC  PRESENT, PREDOMINANTLY PMN MODERATE GRAM POSITIVE COCCI IN PAIRS IN CLUSTERS MODERATE GRAM NEGATIVE RODS RARE GRAM POSITIVE RODS Performed at [facility], [place] [address] [place], [place] [address]    Culture MULTIPLE ORGANISMS PRESENT, NONE PREDOMINANT (A)  Final   Report Status 10/28/2020 FINAL  Final  Fungus Culture With Stain     Status: None (Preliminary result)   Collection Time: 10/26/20  3:59 PM   Specimen: Ankle  Result Value Ref Range Status   Fungus Stain Final report  Final    Comment: (NOTE) Performed At: [facility] [place], [place] [id number] Rush Farmer MD OY:[id number]    Fungus (Mycology) Culture PENDING  Incomplete   Fungal Source ANKLE  Final    Comment: WOUND Performed at [facility], [address] [place], [place] [address]   Aerobic/Anaerobic Culture w Gram Stain (surgical/deep wound)     Status: Abnormal (Preliminary result)   Collection Time: 10/26/20  3:59 PM   Specimen: Tissue  Result Value Ref Range Status   Specimen Description TISSUE RIGHT ANKLE  Final   Special Requests NONE  Final   Gram Stain   Final    MODERATE WBC PRESENT,BOTH PMN AND MONONUCLEAR FEW SQUAMOUS EPITHELIAL CELLS PRESENT ABUNDANT GRAM NEGATIVE RODS MODERATE GRAM POSITIVE COCCI IN PAIRS MODERATE GRAM POSITIVE RODS Performed at [facility], [place] [address], [place], [place] [address]    Culture (A)  Final    MULTIPLE ORGANISMS PRESENT, NONE PREDOMINANT NO ANAEROBES ISOLATED; CULTURE IN PROGRESS FOR 5 DAYS    Report Status PENDING  Incomplete  Fungus Culture Result     Status: None   Collection Time: 10/26/20  3:59 PM  Result Value Ref Range Status   Result 1 Comment  Final    Comment: (NOTE) KOH/Calcofluor preparation:  no fungus observed. Performed At: [facility] [place], [place] [id number] Rush Farmer MD ML:[id number]      Scheduled  Meds: . aspirin  325 mg Oral Daily  . cholecalciferol  1,000  Units Oral Daily  . collagenase   Topical Daily  . doxycycline  100 mg Oral Q12H  . epoetin alfa-epbx  8,000 Units Subcutaneous Q14 Days  . feeding supplement (GLUCERNA SHAKE)  237 mL Oral TID BM  . heparin  5,000 Units Subcutaneous Q8H  . hydrALAZINE  100 mg Oral TID  . hydrocerin   Topical Daily  . insulin aspart  0-15 Units Subcutaneous TID WC  . insulin aspart  0-5 Units Subcutaneous QHS  . insulin detemir  20 Units Subcutaneous QHS  . metoprolol succinate  25 mg Oral QPM  . nystatin   Topical TID  . pravastatin  80 mg Oral QPM  . vitamin C  1,000 mg Oral Daily   Continuous Infusions: .  ceFAZolin (ANCEF) IV 1 g (11/01/20 1109)  . furosemide 120 mg (11/01/20 0944)    Procedures/Studies: DG Chest 2 View  Result Date: 10/25/2020 CLINICAL DATA:  CHF with leg swelling and ulcer to right lateral malleolus. EXAM: CHEST - 2 VIEW COMPARISON:  09/26/20 FINDINGS: There is mild cardiac enlargement, stable from previous exam. No pleural effusion or interstitial edema identified. No airspace densities. IMPRESSION: No acute findings.  No evidence for congestive heart failure. Electronically Signed   By: Kerby Moors M.D.   On: 10/25/2020 14:21   DG Ankle Complete Right  Result Date: 10/25/2020 CLINICAL DATA:  CHF, lower extremity swelling, lateral malleolar ulcer with concern for infection, no reported injury EXAM: RIGHT ANKLE - COMPLETE 3+ VIEW COMPARISON:  09/26/2020 right ankle radiographs FINDINGS: Diffuse right ankle soft tissue swelling. Small soft tissue defect at the lateral malleolus compatible with reported ulcer. Subtle loss of cortical line at the lateral distal margin of the lateral malleolus suspicious for early bony erosion. No fracture. No subluxation. Severe tibiotalar osteoarthritis. Intact staples at the lateral aspect of the calcaneus. Partially visualized intact screw at the base of fifth metatarsal. No focal osseous lesions. IMPRESSION: Diffuse right ankle soft tissue swelling.  Small soft tissue defect at the lateral malleolus compatible with reported ulcer. Subtle loss of cortical line at the lateral distal margin of the lateral malleolus, suspicious for early bony erosion due to osteomyelitis. Consider MRI of the right ankle without IV contrast for further evaluation as clinically warranted. Electronically Signed   By: Ilona Sorrel M.D.   On: 10/25/2020 14:24   MR ANKLE RIGHT WO CONTRAST  Result Date: 10/26/2020 CLINICAL DATA:  Lateral right ankle ulceration EXAM: MRI OF THE RIGHT ANKLE WITHOUT CONTRAST TECHNIQUE: Multiplanar, multisequence MR imaging of the ankle was performed. No intravenous contrast was administered. COMPARISON:  X-ray 10/25/2020 FINDINGS: TENDONS Peroneal: Tendinosis with longitudinal split tear of the infra-malleolar aspect of the peroneus brevis tendon. Intact peroneus longus tendon. No tenosynovitis. Posteromedial: Intact tibialis posterior, flexor hallucis longus and flexor digitorum longus tendons. Anterior: Intact tibialis anterior, extensor hallucis longus and extensor digitorum longus tendons. Achilles: Intact. Plantar Fascia: Intact. LIGAMENTS Lateral: Anterior talofibular ligament and calcaneofibular ligaments are not clearly seen, and likely chronically torn. Posterior talofibular ligament is heterogeneous but intact. Intact anterior and posterior tibiofibular ligaments. Medial: Deltoid ligament appears chronically torn. Portions of the spring ligament complex are ill-defined. CARTILAGE Ankle Joint: Severe tibiotalar osteoarthritis with full-thickness cartilage loss most pronounced anteriorly. No joint effusion. Prominent osseous densities along the posterior aspect of the tibiotalar and subtalar joints, largest measuring up to 2.4 cm. Subtalar Joints/Sinus Tarsi: Moderate arthropathy of the posterior subtalar joint.  No effusion. Preservation of the anatomic fat within the sinus tarsi. Bones: No acute fracture or dislocation. No bony erosion or cortical  destruction. No marrow edema. Well healed calcaneal osteotomy. Metallic susceptibility artifact from hardware within the fifth metatarsal. Other: Soft tissue swelling with superficial ulceration overlying the lateral malleolus. No organized fluid collection. Diffuse edema-like signal of the visualized foot and lower leg musculature. There is also fatty atrophy of the visualized musculature compatible with chronic denervation. IMPRESSION: 1. Soft tissue swelling with superficial ulceration overlying the lateral malleolus. No organized fluid collection. No evidence of osteomyelitis. 2. Severe tibiotalar osteoarthritis with prominent osseous densities along the posterior aspect of the tibiotalar and subtalar joints, largest measuring up to 2.4 cm. 3. Fatty atrophy and edema throughout the visualized foot and lower leg musculature, likely representing a combination of denervation changes and/or myositis. Findings are in agreement with the preliminary report provided by Dr. Francoise Ceo. Preliminary report was called to the ordering provider Eustaquio Maize, PA at 5:54 p.m. on 10/25/2020 by Dr. Francoise Ceo. Electronically Signed   By: Davina Poke D.O.   On: 10/26/2020 08:32   ECHOCARDIOGRAM COMPLETE  Result Date: 10/30/2020    ECHOCARDIOGRAM REPORT   Patient Name:   JAMAREON SHIMEL Date of Exam: 10/30/2020 Medical Rec #:  [id number]   Height:       73.0 in Accession #:    [id number]  Weight:       279.8 lb Date of Birth:  [birth year]    BSA:          2.482 m Patient Age:    36 years    BP:           115/71 mmHg Patient Gender: M           HR:           81 bpm. Exam Location:  [name] Procedure: 2D Echo, Cardiac Doppler and Color Doppler Indications:    I48.0 Paroxysmal atrial fibrillation  History:        Patient has prior history of Echocardiogram examinations, most                 recent 02/24/2019. Risk Factors:Hypertension and Diabetes. CKD.  Sonographer:    Jonelle Sidle Dance Referring Phys: [phone]    Sonographer  Comments: Technically difficult study due to poor echo windows. IMPRESSIONS  1. Left ventricular ejection fraction, by estimation, is 55 to 60%. The left ventricle has normal function. Left ventricular endocardial border not optimally defined to evaluate regional wall motion. There is moderate left ventricular hypertrophy. Left ventricular diastolic parameters are indeterminate.  2. Right ventricular systolic function is mildly reduced. The right ventricular size is moderately enlarged. There is severely elevated pulmonary artery systolic pressure. The estimated right ventricular systolic pressure is 01.0 mmHg.  3. Left atrial size was mildly dilated.  4. Right atrial size was moderately dilated.  5. The mitral valve is normal in structure. Mild mitral valve regurgitation. No evidence of mitral stenosis.  6. The aortic valve is grossly normal. Aortic valve regurgitation is not visualized. No aortic stenosis is present.  7. The inferior vena cava is dilated in size with <50% respiratory variability, suggesting right atrial pressure of 15 mmHg. FINDINGS  Left Ventricle: Left ventricular ejection fraction, by estimation, is 55 to 60%. The left ventricle has normal function. Left ventricular endocardial border not optimally defined to evaluate regional wall motion. The left ventricular internal cavity size was normal in size. There is moderate left ventricular hypertrophy. Left  ventricular diastolic parameters are indeterminate. Right Ventricle: The right ventricular size is moderately enlarged. No increase in right ventricular wall thickness. Right ventricular systolic function is mildly reduced. There is severely elevated pulmonary artery systolic pressure. The tricuspid regurgitant velocity is 3.41 m/s, and with an assumed right atrial pressure of 15 mmHg, the estimated right ventricular systolic pressure is 01.7 mmHg. Left Atrium: Left atrial size was mildly dilated. Right Atrium: Right atrial size was moderately  dilated. Pericardium: There is no evidence of pericardial effusion. Mitral Valve: The mitral valve is normal in structure. Mild mitral valve regurgitation. No evidence of mitral valve stenosis. Tricuspid Valve: The tricuspid valve is normal in structure. Tricuspid valve regurgitation is not demonstrated. No evidence of tricuspid stenosis. Aortic Valve: The aortic valve is grossly normal. Aortic valve regurgitation is not visualized. No aortic stenosis is present. Pulmonic Valve: The pulmonic valve was normal in structure. Pulmonic valve regurgitation is not visualized. No evidence of pulmonic stenosis. Aorta: The aortic root is normal in size and structure. Venous: The inferior vena cava is dilated in size with less than 50% respiratory variability, suggesting right atrial pressure of 15 mmHg. IAS/Shunts: The interatrial septum was not well visualized.  LEFT VENTRICLE PLAX 2D LVIDd:         4.67 cm  Diastology LVIDs:         3.08 cm  LV e' medial:    6.57 cm/s LV PW:         1.67 cm  LV E/e' medial:  21.6 LV IVS:        1.37 cm  LV e' lateral:   7.36 cm/s LVOT diam:     2.00 cm  LV E/e' lateral: 19.3 LV SV:         54 LV SV Index:   22 LVOT Area:     3.14 cm  RIGHT VENTRICLE RV Basal diam:  3.79 cm RV Mid diam:    3.04 cm RV S prime:     11.10 cm/s TAPSE (M-mode): 1.6 cm LEFT ATRIUM             Index       RIGHT ATRIUM           Index LA diam:        5.00 cm 2.01 cm/m  RA Area:     29.80 cm LA Vol (A2C):   98.6 ml 39.73 ml/m RA Volume:   108.00 ml 43.52 ml/m LA Vol (A4C):   96.5 ml 38.89 ml/m LA Biplane Vol: 99.0 ml 39.89 ml/m  AORTIC VALVE LVOT Vmax:   80.90 cm/s LVOT Vmean:  55.700 cm/s LVOT VTI:    0.172 m  AORTA Ao Root diam: 3.60 cm Ao Asc diam:  3.20 cm MITRAL VALVE                TRICUSPID VALVE MV Area (PHT): 2.96 cm     TR Peak grad:   46.5 mmHg MV Decel Time: 257 msec     TR Vmax:        341.00 cm/s MV E velocity: 142.00 cm/s                             SHUNTS                             Systemic  VTI:  0.17 m  Systemic Diam: 2.00 cm Cherlynn Kaiser MD Electronically signed by Cherlynn Kaiser MD Signature Date/Time: 10/30/2020/4:12:09 PM    Final     Orson Eva, DO  [facility]  If 7PM-7AM, please contact night-coverage [email or web address] Password [email or web address] 11/01/2020, 11:19 AM   LOS: 4 days

## 2020-11-01 NOTE — TOC Progression Note (Signed)
Transition of Care [facility]) - Progression Note    Patient Details  Name: Danny [name] MRN: [id number] Date of Birth: [birth year]  Transition of Care [facility]) CM/SW Contact  Boneta Lucks, RN Phone Number: 11/01/2020, 3:28 PM  Clinical Narrative:    PT is coming to [facility] to say patient really needs SNF. TOC spoke with patient again. We discussed his concerns at home with landlord and paper work needing to be completed. Patient gave permission for [facility] to call his sister. She is agreeing to go get the papers and help patient any way she can.  TOC offer to assist with copies and mailing to allow patient to go to SNF.  Sent out to [place] for a bed offer. TOC to follow.               Note Details  Author Peri Jefferson, RN File Time 11/01/2020 3:27 PM  Author Type Registered Nurse Status Signed  Last Editor Peri Jefferson, RN Service [place] # [phone] Admit Date 10/23/2020

## 2020-11-01 NOTE — Progress Notes (Signed)
Physical Therapy Treatment Patient Details Name: Danny [name] MRN: [id number] DOB: 1956/03/27 Today's Date: 11/01/2020    History of Present Illness Danny [name] is a 65 y.o. male presents with c/o leg swelling and weakness, venous stasis changes to legs for years. R ankle MRI negaitve for osteomyelitis. PMH: HTN, diabetes, CKD, anemia, TURP 12/2019    PT Comments    Patient presents seated in chair and agreeable for therapy.  Patient demonstrates good return for completing BLE ROM/strengthening exercises with verbal cueing, slow labored movement and multiple attempts before able to complete a sit to stand from chair due to generalized weakness and tolerated staying up in chair after therapy.  Patient will benefit from continued physical therapy in hospital and recommended venue below to increase strength, balance, endurance for safe ADLs and gait.    Follow Up Recommendations  SNF     Equipment Recommendations  None recommended by PT    Recommendations for Other Services       Precautions / Restrictions Precautions Precautions: Fall Precaution Comments: wounds Restrictions Weight Bearing Restrictions: No    Mobility  Bed Mobility               General bed mobility comments: presents up in chair assisted by nursing staff using mechanical lift    Transfers Overall transfer level: Needs assistance Equipment used: Rolling walker (2 wheeled) Transfers: Sit to/from Stand Sit to Stand: Mod assist;Max assist         General transfer comment: slow labored movement for completing sit to stand from chair  Ambulation/Gait                 Stairs             Wheelchair Mobility    Modified Rankin (Stroke Patients Only)       Balance Overall balance assessment: Needs assistance Sitting-balance support: Feet supported;No upper extremity supported Sitting balance-Leahy Scale: Good Sitting balance - Comments: seated edge of chair   Standing balance  support: During functional activity;Bilateral upper extremity supported Standing balance-Leahy Scale: Poor Standing balance comment: using RW                            Cognition Arousal/Alertness: Awake/alert Behavior During Therapy: WFL for tasks assessed/performed Overall Cognitive Status: Within Functional Limits for tasks assessed                                        Exercises General Exercises - Lower Extremity Long Arc Quad: Seated;AROM;Strengthening;Both;10 reps Hip Flexion/Marching: Seated;AROM;Strengthening;Both;10 reps Toe Raises: Seated;AROM;Strengthening;Both;10 reps Heel Raises: Seated;AROM;Strengthening;10 reps;Both    General Comments        Pertinent Vitals/Pain Pain Assessment: No/denies pain    Home Living                      Prior Function            PT Goals (current goals can now be found in the care plan section) Acute Rehab PT Goals Patient Stated Goal: "return home" PT Goal Formulation: With patient Time For Goal Achievement: 11/09/20 Potential to Achieve Goals: Good Progress towards PT goals: Progressing toward goals    Frequency    Min 3X/week      PT Plan      Co-evaluation  AM-PAC PT "6 Clicks" Mobility   Outcome Measure  Help needed turning from your back to your side while in a flat bed without using bedrails?: A Little Help needed moving from lying on your back to sitting on the side of a flat bed without using bedrails?: A Little Help needed moving to and from a bed to a chair (including a wheelchair)?: A Lot Help needed standing up from a chair using your arms (e.g., wheelchair or bedside chair)?: A Lot Help needed to walk in hospital room?: Total Help needed climbing 3-5 steps with a railing? : Total 6 Click Score: 12    End of Session   Activity Tolerance: Patient tolerated treatment well;Patient limited by fatigue Patient left: in chair;with call bell/phone  within reach Nurse Communication: Mobility status PT Visit Diagnosis: Unsteadiness on feet (R26.81);Other abnormalities of gait and mobility (R26.89);Muscle weakness (generalized) (M62.81)     Time: 9507-2257 PT Time Calculation (min) (ACUTE ONLY): 27 min  Charges:  $Therapeutic Exercise: 8-22 mins $Therapeutic Activity: 8-22 mins                     2:57 PM, 11/01/20 Lonell Grandchild, MPT Physical Therapist with [facility] [phone] office [phone] mobile phone

## 2020-11-01 NOTE — NC FL2 (Signed)
[place] LEVEL OF CARE SCREENING TOOL     IDENTIFICATION  Patient Name: Danny [name] Birthdate: [birth year] Sex: male Admission Date (Current Location): 10/25/2020  [facility] and [place] Number:  [employer] and Address:         Provider Number: [id number]  Attending Physician Name and Address:  Orson Eva, MD  Relative Name and Phone Number:  Hulan Amato - sister - [phone]    Current Level of Care: Hospital Recommended Level of Care: [place] Prior Approval Number:    Date Approved/Denied:   PASRR Number: [id number]  Discharge Plan: SNF    Current Diagnoses: Patient Active Problem List   Diagnosis Date Noted  . Cellulitis, leg 10/28/2020  . CKD (chronic kidney disease) stage 4, GFR 15-29 ml/min (HCC) 10/28/2020  . Moderate protein-calorie malnutrition ([place]) 10/26/2020  . Medical non-compliance 10/26/2020  . Chronic ulcer of right ankle ([place])   . Venous stasis   . Cellulitis 10/25/2020  . Normocytic anemia 09/13/2020  . Class 2 obesity   . Respiratory failure ([place]) 03/23/2020  . UTI (urinary tract infection), bacterial 01/22/2020  . Acute on chronic anemia 01/16/2020  . Benign prostatic hyperplasia with urinary obstruction 01/15/2020  . AKI (acute kidney injury) ([place]) 07/08/2019  . Hypoxia 07/08/2019  . Pressure injury of skin 07/08/2019  . Edema of both lower extremities   . Anasarca 03/23/2019  . Bladder outlet obstruction 03/23/2019  . Yeast infection of the skin 03/16/2019  . Diabetic ulcer of left foot ([place]) 03/15/2019  . Diabetic ulcer of right ankle ([place]) 03/15/2019  . Hypertension associated with stage 3 chronic kidney disease due to type 2 diabetes mellitus ([place]) 03/09/2019  . Controlled type 2 diabetes mellitus with stage 3 chronic kidney disease, with long-term current use of insulin ([place]) 03/09/2019  . Dyslipidemia associated with type 2 diabetes mellitus ([place]) 03/09/2019  . Chronic gout due to renal  impairment without tophus 03/09/2019  . Emphysematous cystitis 03/05/2019  . Bilateral hydronephrosis   . Bilateral cellulitis of lower leg 03/03/2019  . Urinary retention 03/03/2019  . Klebsiella Cystitis 03/03/2019  . UTI due to Klebsiella species 03/03/2019  . Plantar ulcer of left foot ([place]) 03/03/2019  . Acute on chronic diastolic (congestive) heart failure ([place]) 02/25/2019  . Acute respiratory failure with hypoxia ([place]) 02/25/2019  . Acute renal failure superimposed on stage 3 chronic kidney disease ([place]) 02/24/2019  . Congestive heart failure ([place]) 02/24/2019  . Dyspnea 02/23/2019  . Essential hypertension 02/23/2019  . Diabetes mellitus ([place]) 02/23/2019  . CKD (chronic kidney disease) 02/23/2019  . Psoriasis 02/23/2019    Orientation RESPIRATION BLADDER Height & Weight     Self,Time,Situation,Place  Normal Continent Weight: 133 kg Height:  6\' 1"  (185.4 cm)  BEHAVIORAL SYMPTOMS/MOOD NEUROLOGICAL BOWEL NUTRITION STATUS      Continent Diet (See DC summary)  AMBULATORY STATUS COMMUNICATION OF NEEDS Skin   Extensive Assist Verbally Other (Comment) (Right lower cellulitis)                       Personal Care Assistance Level of Assistance  Bathing,Dressing,Feeding Bathing Assistance: Maximum assistance Feeding assistance: Limited assistance Dressing Assistance: Maximum assistance     Functional Limitations Info  Sight,Hearing,Speech Sight Info: Adequate Hearing Info: Adequate Speech Info: Adequate    SPECIAL CARE FACTORS FREQUENCY  PT (By licensed PT)     PT Frequency: 5 times              Contractures  Contractures Info: Not present    Additional Factors Info  Code Status,Allergies Code Status Info: FULL Allergies Info: Dust, prednisone, rocephin           Current Medications (11/01/2020):  This is the current hospital active medication list Current Facility-Administered Medications  Medication Dose Route Frequency Provider Last Rate Last Admin   . acetaminophen (TYLENOL) tablet 650 mg  650 mg Oral Q6H PRN Zierle-Ghosh, Asia B, DO       Or  . acetaminophen (TYLENOL) suppository 650 mg  650 mg Rectal Q6H PRN Zierle-Ghosh, Asia B, DO      . aspirin tablet 325 mg  325 mg Oral Daily Zierle-Ghosh, Asia B, DO   325 mg at 11/01/20 0944  . ceFAZolin (ANCEF) IVPB 1 g/50 mL premix  1 g Intravenous Therisa Doyne, MD 100 mL/hr at 11/01/20 1109 1 g at 11/01/20 1109  . cholecalciferol (VITAMIN D3) tablet 1,000 Units  1,000 Units Oral Daily Zierle-Ghosh, Asia B, DO   1,000 Units at 11/01/20 0945  . collagenase (SANTYL) ointment   Topical Daily Zierle-Ghosh, Asia B, DO   Given at 11/01/20 0945  . doxycycline (VIBRA-TABS) tablet 100 mg  100 mg Oral Therisa Doyne, MD   100 mg at 11/01/20 0945  . epoetin alfa-epbx (RETACRIT) injection 8,000 Units  8,000 Units Subcutaneous Q14 Days Skipper Cliche A, MD   8,000 Units at 10/26/20 1246  . feeding supplement (GLUCERNA SHAKE) (GLUCERNA SHAKE) liquid 237 mL  237 mL Oral TID BM Zierle-Ghosh, Asia B, DO   237 mL at 11/01/20 0945  . furosemide (LASIX) 120 mg in dextrose 5 % 50 mL IVPB  120 mg Intravenous BID Pearson Grippe B, MD 62 mL/hr at 11/01/20 0944 120 mg at 11/01/20 0944  . heparin injection 5,000 Units  5,000 Units Subcutaneous Q8H Zierle-Ghosh, Asia B, DO   5,000 Units at 11/01/20 1326  . hydrALAZINE (APRESOLINE) tablet 100 mg  100 mg Oral TID Skipper Cliche A, MD   100 mg at 11/01/20 0944  . hydrocerin (EUCERIN) cream   Topical Daily Zierle-Ghosh, Asia B, DO   Given at 11/01/20 0945  . insulin aspart (novoLOG) injection 0-15 Units  0-15 Units Subcutaneous TID WC Zierle-Ghosh, Asia B, DO   2 Units at 10/30/20 1841  . insulin aspart (novoLOG) injection 0-5 Units  0-5 Units Subcutaneous QHS Zierle-Ghosh, Asia B, DO      . insulin detemir (LEVEMIR) injection 20 Units  20 Units Subcutaneous QHS Zierle-Ghosh, Asia B, DO   20 Units at 10/31/20 2132  . metoprolol succinate (TOPROL-XL) 24 hr tablet 25 mg  25  mg Oral QPM Tat, David, MD      . nystatin (MYCOSTATIN/NYSTOP) topical powder   Topical TID Zierle-Ghosh, Asia B, DO   Given at 11/01/20 0945  . ondansetron (ZOFRAN) tablet 4 mg  4 mg Oral Q6H PRN Zierle-Ghosh, Asia B, DO       Or  . ondansetron (ZOFRAN) injection 4 mg  4 mg Intravenous Q6H PRN Zierle-Ghosh, Asia B, DO   4 mg at 10/28/20 1928  . oxyCODONE (Oxy IR/ROXICODONE) immediate release tablet 5 mg  5 mg Oral Q4H PRN Zierle-Ghosh, Asia B, DO      . polyethylene glycol (MIRALAX / GLYCOLAX) packet 17 g  17 g Oral Daily Tat, David, MD   17 g at 11/01/20 1326  . pravastatin (PRAVACHOL) tablet 80 mg  80 mg Oral QPM Zierle-Ghosh, Asia B, DO   80 mg at 10/31/20 1716  .  senna (SENOKOT) tablet 17.2 mg  2 tablet Oral Daily Tat, David, MD   17.2 mg at 11/01/20 1326  . sodium chloride (OCEAN) 0.65 % nasal spray 1 spray  1 spray Each Nare PRN Orson Eva, MD   1 spray at 10/30/20 [phone]  . vitamin C (ASCORBIC ACID) tablet 1,000 mg  1,000 mg Oral Daily Zierle-Ghosh, Asia B, DO   1,000 mg at 11/01/20 0945     Discharge Medications: Please see discharge summary for a list of discharge medications.  Relevant Imaging Results:  Relevant Lab Results:   Additional Information SS#[id number]  Boneta Lucks, RN

## 2020-11-01 NOTE — Telephone Encounter (Signed)
Pt was not d/c from Hospital per Wells Guiles she will tell patient to call us to r/s when he gets home and has been d/c from [facility].

## 2020-11-01 NOTE — TOC Progression Note (Addendum)
Transition of Care [facility]) - Progression Note    Patient Details  Name: Danny [name] MRN: [id number] Date of Birth: 11-23-1955  [facility] CM/SW Contact  Boneta Lucks, RN Phone Number: 11/01/2020, 10:04 AM  Clinical Narrative:   Cancelled Outpatient wound care appointment for May 10th, also called [facility] to cancel transportation. Patient is needing inpatient continued medical workup.

## 2020-11-02 ENCOUNTER — Ambulatory Visit (HOSPITAL_COMMUNITY): Payer: Medicare HMO | Admitting: Physical Therapy

## 2020-11-02 DIAGNOSIS — N184 Chronic kidney disease, stage 4 (severe): Secondary | ICD-10-CM | POA: Diagnosis not present

## 2020-11-02 DIAGNOSIS — I878 Other specified disorders of veins: Secondary | ICD-10-CM | POA: Diagnosis not present

## 2020-11-02 DIAGNOSIS — L03119 Cellulitis of unspecified part of limb: Secondary | ICD-10-CM | POA: Diagnosis not present

## 2020-11-02 DIAGNOSIS — Z9119 Patient's noncompliance with other medical treatment and regimen: Secondary | ICD-10-CM | POA: Diagnosis not present

## 2020-11-02 LAB — CBC
HCT: 25.6 % — ABNORMAL LOW (ref 39.0–52.0)
Hemoglobin: 8.4 g/dL — ABNORMAL LOW (ref 13.0–17.0)
MCH: 27.3 pg (ref 26.0–34.0)
MCHC: 32.8 g/dL (ref 30.0–36.0)
MCV: 83.1 fL (ref 80.0–100.0)
Platelets: 206 10*3/uL (ref 150–400)
RBC: 3.08 MIL/uL — ABNORMAL LOW (ref 4.22–5.81)
RDW: 16.9 % — ABNORMAL HIGH (ref 11.5–15.5)
WBC: 5.5 10*3/uL (ref 4.0–10.5)
nRBC: 0 % (ref 0.0–0.2)

## 2020-11-02 LAB — GLUCOSE, CAPILLARY
Glucose-Capillary: 83 mg/dL (ref 70–99)
Glucose-Capillary: 147 mg/dL — ABNORMAL HIGH (ref 70–99)
Glucose-Capillary: 173 mg/dL — ABNORMAL HIGH (ref 70–99)
Glucose-Capillary: 140 mg/dL — ABNORMAL HIGH (ref 70–99)

## 2020-11-02 LAB — RENAL FUNCTION PANEL
Albumin: 2.6 g/dL — ABNORMAL LOW (ref 3.5–5.0)
Anion gap: 9 (ref 5–15)
BUN: 74 mg/dL — ABNORMAL HIGH (ref 8–23)
CO2: 23 mmol/L (ref 22–32)
Calcium: 8.5 mg/dL — ABNORMAL LOW (ref 8.9–10.3)
Chloride: 103 mmol/L (ref 98–111)
Creatinine, Ser: 5.15 mg/dL — ABNORMAL HIGH (ref 0.61–1.24)
GFR, Estimated: 12 mL/min — ABNORMAL LOW (ref >60)
Glucose, Bld: 81 mg/dL (ref 70–99)
Phosphorus: 5.2 mg/dL — ABNORMAL HIGH (ref 2.5–4.6)
Potassium: 4.7 mmol/L (ref 3.5–5.1)
Sodium: 135 mmol/L (ref 135–145)

## 2020-11-02 NOTE — Progress Notes (Signed)
PROGRESS NOTE  Danny [name] [facility]:[id number] DOB: [birth year] DOA: 10/25/2020 PCP: Celene Squibb, MD   Brief History: 65 y/owith a history of hypertension, diabetes mellitus type 2, CKD stage III, psoriasis, venous stasis dermatitis, lymphedema presenting with 2-weekhistory of his legs edema, pain, erythema and becoming weak. He reports he was placed on a 2 week course of doxy in the beginning of April with some improvement, but his legs have worsened since finishing doxy 1-2 weeks prior to this admission. He states he is WC bound and has not ambulated since July 2021. He reports his legs have been draining yellow clear and cloudy fluid.  In the ED Temp 97.5, heart rate 81, respiratory rate 16-18, blood pressure 132/76, satting at 97% No leukocytosis with white blood cell count of 7.4, hemoglobin 8.9 Chemistry panel reveals an elevated BUN and creatinine that are close to his baseline at 47 and 3.79 Glucose is 151 UA is not indicative of UTI CRP is 3.4, sed rate is 69 COVIDneg Blood cultures neg EKG shows a heart rate of 97, QTc 477, PVCs Chest x-ray is negative Alk phos is elevated 131 Admission was requested to antibiotics and wound care to patient. Does not have reliable meansto follow-up outpatient -patient was started on abx with some clinical improvement.  He was started on IV lasix for his anasarca.  Nephrology was consulted.  His serum creatinine is slowly rising after starting on IV lasix.  Assessment/Plan: Bilateral lower extremity cellulitis-ankle open wound, chronic venous stasisdermatitis 1. Likely a component of cellulitis and a component of venous stasis contributing to the open wounds on patient's leg; Failed outpatient antibiotics 2. Wound care consulted, also general surgery consulted for evaluation possible debridement. Appreciate Dr. Pamelia Hoit taken. Patient not interested in surgery or amputation 3. Patient was initiated on vancomycin, due  to acute on chronic kidney disease, switch to linezolidinitially. 4. Came to the ER earlier in April and was started on doxycycline, reports taking all of it but then having no way to follow-up with any provider secondary to transportation barriers, now legs worsening 5. Vancomycin started in the ED.Marland KitchenMarland KitchenDiscontinued 10/26/2020 6. Blood cultures>>>>neg to date 7. MRI ankle--no abscess, no osteomyelitis. Fatty atrophy of visualized musculature = chronic devervation 8. CRP 3.4, ESR 69 9. D/c zyvox. Start cefazolin anddoxy 5/4 10. Appreciate general surgery consult-->bx and culture done 5/3 11. SW/TOC assisting with setting up transport with [facility] transportand appointment with outpatient wound care clinic 12. Right heel repeat biopsy by Dr. Constance Haw b/c cannot r/o SCC on prior bx on 10/26/20  New Onset Atrial fibrillation -personally reviewed EKG--Afib, nonspecific T wave change -rate controlled -CHADS-VASc = 5 (CHF, HTN, Age, DM, ASVD) DM, ASVD) -continue metoprolol succinate -start apixaban -5/7EchoEF 55-60%; mild decrease RV fxn, RVSP 61.5 -TSH 2.028  Acute on chronicCKD 4 -Trend BUN/creatinine--uptrending -appear volume overloaded -baseline creatinine 3.7-4.0 -serum creatinine up to 4.78>>5.19>>5.15 -renal consult appreciated-discussed with Dr. Joelyn Oms -continue IV lasix -suspect he may to need to be initiated on HD this hospitalization  Fall and Generalized weakness 1. Mechanical fall no LOC, did not hit head 2. Consult PT-->SNF  DMII-uncontrolledwith hyperglycemia 1. CBG QA CHS, SSI coverage, 2. Takes 70/30 20-100 unitsat home 3. Continue Levemir 20 units 4. A1c:7.6 5. Carb modified diet Moderate protein cal malnutrition 1. Albumin 2.8 2. Counseled on the importance of nutrient dense diet 3. Glucerna shakes 4. Continue to monitor Medical non compliance 1. TOC consulted for possible PT at home?Any assistance with transportation  for medical  appointments  Class 2 Obesity -BMI 36.91 -lifestyle modification  Atypical Chest pain -had CP 10/30/20 -personally reviewed EKG--sinus, nonspecific T wave change -troponin = 12>> -resolved     Status [id number]  The patient will require care spanning > 2 midnights and should be moved to inpatient because:IV treatments appropriate due to intensity of illness or inability to take PO  Dispo: The patient is from:Home Anticipated d/c is JW:[id number] Patient currently is not medically stable to d/c. Difficult to place patient no        Family Communication:noFamily at bedside  Consultants:none  Code Status: FULL  DVT Prophylaxis: [place] Heparin    Procedures: As Listed in Progress Note Above  Antibiotics: zyvox 5/3>>5/4 Cefazolin 5/4>> Doxy 5/4>>    Subjective: Patient denies fevers, chills, headache, chest pain, dyspnea, nausea, vomiting, diarrhea, abdominal pain, dysuria, hematuria, hematochezia, and melena.   Objective: Vitals:   11/01/20 2007 11/02/20 0447 11/02/20 0500 11/02/20 1348  BP: (!) 141/84 126/74  140/72  Pulse: 86 74  73  Resp: _0 Temp: 97.6 F (36.4 C) 98.4 F (36.9 C)  98.2 F (36.8 C)  TempSrc: Oral   Oral  SpO2: 98% 95%  98%  Weight:   (!) 136.8 kg   Height:        Intake/Output Summary (Last 24 hours) at 11/02/2020 1556 Last data filed at 11/02/2020 1510 Gross per 24 hour  Intake 1410 ml  Output 4100 ml  Net -2690 ml   Weight change: 3.8 kg Exam:   General:  Pt is alert, follows commands appropriately, not in acute distress  HEENT: No icterus, No thrush, No neck mass, [place]/AT  Cardiovascular: RRR, S1/S2, no rubs, no gallops  Respiratory: bibasilar rales. No wheeze  Abdomen: Soft/+BS, non tender, non distended, no guarding  Extremities: 2 + LE edema, No lymphangitis, No petechiae, No rashes, no synovitis   Data Reviewed: I have personally  reviewed following labs and imaging studies Basic Metabolic Panel: Recent Labs  Lab 10/29/20 0606 10/30/20 0534 10/31/20 0419 11/01/20 0442 11/02/20 0643  NA 136 134* 133* 134* 135  K 4.7 4.8 5.0 4.6 4.7  CL 109 105 103 104 103  CO2 21* 21* 21* 21* 23  GLUCOSE 106* 88 108* 91 81  BUN 52* 59* 64* 71* 74*  CREATININE 4.29* 4.73* 4.78* 5.19* 5.15*  CALCIUM 8.3* 8.4* 8.1* 8.3* 8.5*  MG  --   --   --  2.2  --   PHOS  --   --   --   --  5.2*   Liver Function Tests: Recent Labs  Lab 11/02/20 0643  ALBUMIN 2.6*   No results for input(s): LIPASE, AMYLASE in the last 168 hours. No results for input(s): AMMONIA in the last 168 hours. Coagulation Profile: No results for input(s): INR, PROTIME in the last 168 hours. CBC: Recent Labs  Lab 10/29/20 0606 10/30/20 0534 10/31/20 0419 11/01/20 0442 11/02/20 0643  WBC 7.1 6.7 7.0 5.9 5.5  HGB 8.3* 8.6* 8.4* 8.4* 8.4*  HCT 26.1* 27.0* 26.7* 26.6* 25.6*  MCV 86.4 86.3 85.6 85.0 83.1  PLT 174 205 194 215 206   Cardiac Enzymes: No results for input(s): CKTOTAL, CKMB, CKMBINDEX, TROPONINI in the last 168 hours. BNP: Invalid input(s): POCBNP CBG: Recent Labs  Lab 11/01/20 1122 11/01/20 1604 11/01/20 2031 11/02/20 0731 11/02/20 1131  GLUCAP 145* 110* 154* 83 147*   HbA1C: No results for input(s): HGBA1C in the last 72 hours. Urine analysis:  Component Value Date/Time   COLORURINE YELLOW 10/25/2020 1516   APPEARANCEUR CLEAR 10/25/2020 1516   APPEARANCEUR Clear 09/10/2020 1122   LABSPEC 1.010 10/25/2020 1516   PHURINE 6.0 10/25/2020 1516   GLUCOSEU 150 (A) 10/25/2020 1516   HGBUR SMALL (A) 10/25/2020 1516   BILIRUBINUR NEGATIVE 10/25/2020 1516   BILIRUBINUR Negative 09/10/2020 1122   KETONESUR NEGATIVE 10/25/2020 1516   PROTEINUR >=300 (A) 10/25/2020 1516   NITRITE NEGATIVE 10/25/2020 1516   LEUKOCYTESUR NEGATIVE 10/25/2020 1516   Sepsis Labs: _0 (procalcitonin:4,lacticidven:4) ) Recent Results (from the  past 240 hour(s))  Aerobic Culture w Gram Stain (superficial specimen)     Status: Abnormal   Collection Time: 10/25/20  3:29 PM   Specimen: Skin, Cyst/Tag/Debridement; Wound  Result Value Ref Range Status   Specimen Description   Final    SKIN Performed at [facility] [address], [place], [place] [address]    Special Requests   Final    NONE Performed at [facility], [address], [place], [place] [address]    Gram Stain   Final    RARE WBC PRESENT, PREDOMINANTLY PMN RARE GRAM POSITIVE COCCI IN PAIRS RARE GRAM NEGATIVE RODS Performed at [facility], [place] [address], [place], [place] [address]    Culture MULTIPLE ORGANISMS PRESENT, NONE PREDOMINANT (A)  Final   Report Status 10/27/2020 FINAL  Final  Culture, blood (routine x 2)     Status: None   Collection Time: 10/25/20  3:59 PM   Specimen: BLOOD  Result Value Ref Range Status   Specimen Description BLOOD LEFT ARM  Final   Special Requests   Final    BOTTLES DRAWN AEROBIC AND ANAEROBIC Blood Culture adequate volume   Culture   Final    NO GROWTH 5 DAYS Performed at [facility], [address] [place], [place] [address]    Report Status 10/30/2020 FINAL  Final  Culture, blood (routine x 2)     Status: None   Collection Time: 10/25/20  3:59 PM   Specimen: BLOOD  Result Value Ref Range Status   Specimen Description BLOOD RIGHT ARM  Final   Special Requests   Final    BOTTLES DRAWN AEROBIC AND ANAEROBIC Blood Culture results may not be optimal due to an excessive volume of blood received in culture bottles   Culture   Final    NO GROWTH 5 DAYS Performed at [facility], [address], [place], [place] [address]    Report Status 10/30/2020 FINAL  Final  SARS CORONAVIRUS 2 (Kaliya Shreiner 6-24 HRS) Nasopharyngeal Nasopharyngeal Swab     Status: None   Collection Time: 10/25/20  4:30 PM   Specimen: Nasopharyngeal Swab  Result Value Ref Range Status   SARS Coronavirus 2 NEGATIVE NEGATIVE Final    Comment:  (NOTE) SARS-CoV-2 target nucleic acids are NOT DETECTED.  The SARS-CoV-2 RNA is generally detectable in upper and lower respiratory specimens during the acute phase of infection. Negative results do not preclude SARS-CoV-2 infection, do not rule out co-infections with other pathogens, and should not be used as the sole basis for treatment or other patient management decisions. Negative results must be combined with clinical observations, patient history, and epidemiological information. The expected result is Negative.  Fact Sheet for Patients: SugarRoll.be  Fact Sheet for Healthcare Providers: https://www.woods-mathews.com/  This test is not yet approved or cleared by the [place] FDA and  has been authorized for detection and/or diagnosis of SARS-CoV-2 by FDA under an  Emergency Use Authorization (EUA). This EUA will remain  in effect (meaning this test can be used) for the duration of the COVID-19 declaration under Se ction 564(b)(1) of the Act, 21 U.S.C. section 360bbb-3(b)(1), unless the authorization is terminated or revoked sooner.  Performed at [facility], [place] [address] [place], [place] [address]   Aerobic Culture w Gram Stain (superficial specimen)     Status: Abnormal   Collection Time: 10/25/20  5:10 PM   Specimen: Ankle; Wound  Result Value Ref Range Status   Specimen Description   Final    ANKLE Performed at [facility], [address] [place], [place] [address]    Special Requests   Final    NONE Performed at [facility] [place], [place] [address]    Gram Stain   Final    RARE WBC PRESENT, PREDOMINANTLY PMN MODERATE GRAM POSITIVE COCCI IN PAIRS IN CLUSTERS MODERATE GRAM NEGATIVE RODS RARE GRAM POSITIVE RODS Performed at [facility], [place] [address], [place], [place] [address]    Culture MULTIPLE ORGANISMS PRESENT, NONE PREDOMINANT (A)  Final   Report Status 10/28/2020 FINAL   Final  Fungus Culture With Stain     Status: None (Preliminary result)   Collection Time: 10/26/20  3:59 PM   Specimen: Ankle  Result Value Ref Range Status   Fungus Stain Final report  Final    Comment: (NOTE) Performed At: [facility] [address] [place], [place] [id number] Rush Farmer MD OF:[id number]    Fungus (Mycology) Culture PENDING  Incomplete   Fungal Source ANKLE  Final    Comment: WOUND Performed at [facility], [address] [place], [place] [address]   Aerobic/Anaerobic Culture w Gram Stain (surgical/deep wound)     Status: Abnormal   Collection Time: 10/26/20  3:59 PM   Specimen: Tissue  Result Value Ref Range Status   Specimen Description TISSUE RIGHT ANKLE  Final   Special Requests NONE  Final   Gram Stain   Final    MODERATE WBC PRESENT,BOTH PMN AND MONONUCLEAR FEW SQUAMOUS EPITHELIAL CELLS PRESENT ABUNDANT GRAM NEGATIVE RODS MODERATE GRAM POSITIVE COCCI IN PAIRS MODERATE GRAM POSITIVE RODS    Culture (A)  Final    MULTIPLE ORGANISMS PRESENT, NONE PREDOMINANT NO GROUP A STREP (S.PYOGENES) ISOLATED NO STAPHYLOCOCCUS AUREUS ISOLATED NO ANAEROBES ISOLATED Performed at [facility], [place] [address], [place], [place] [address]    Report Status 11/01/2020 FINAL  Final  Fungus Culture Result     Status: None   Collection Time: 10/26/20  3:59 PM  Result Value Ref Range Status   Result 1 Comment  Final    Comment: (NOTE) KOH/Calcofluor preparation:  no fungus observed. Performed At: [facility] [place], [place] [id number] Rush Farmer MD QP:[id number]      Scheduled Meds: . aspirin  325 mg Oral Daily  . cholecalciferol  1,000 Units Oral Daily  . collagenase   Topical Daily  . doxycycline  100 mg Oral Q12H  . epoetin alfa-epbx  8,000 Units Subcutaneous Q14 Days  . feeding supplement (GLUCERNA SHAKE)  237 mL Oral TID BM  . heparin  5,000 Units Subcutaneous Q8H  . hydrALAZINE  100 mg Oral TID  . hydrocerin    Topical Daily  . insulin aspart  0-15 Units Subcutaneous TID WC  . insulin aspart  0-5 Units Subcutaneous QHS  . insulin detemir  20 Units Subcutaneous QHS  . metoprolol succinate  25 mg Oral QPM  . nystatin   Topical TID  . polyethylene glycol  17 g Oral Daily  . pravastatin  80 mg Oral QPM  . senna  2 tablet Oral Daily  . vitamin C  1,000 mg Oral Daily   Continuous Infusions: .  ceFAZolin (ANCEF) IV 1 g (11/02/20 1015)  . furosemide 120 mg (11/02/20 0848)    Procedures/Studies: DG Chest 2 View  Result Date: 10/25/2020 CLINICAL DATA:  CHF with leg swelling and ulcer to right lateral malleolus. EXAM: CHEST - 2 VIEW COMPARISON:  09/26/20 FINDINGS: There is mild cardiac enlargement, stable from previous exam. No pleural effusion or interstitial edema identified. No airspace densities. IMPRESSION: No acute findings.  No evidence for congestive heart failure. Electronically Signed   By: Kerby Moors M.D.   On: 10/25/2020 14:21   DG Ankle Complete Right  Result Date: 10/25/2020 CLINICAL DATA:  CHF, lower extremity swelling, lateral malleolar ulcer with concern for infection, no reported injury EXAM: RIGHT ANKLE - COMPLETE 3+ VIEW COMPARISON:  09/26/2020 right ankle radiographs FINDINGS: Diffuse right ankle soft tissue swelling. Small soft tissue defect at the lateral malleolus compatible with reported ulcer. Subtle loss of cortical line at the lateral distal margin of the lateral malleolus suspicious for early bony erosion. No fracture. No subluxation. Severe tibiotalar osteoarthritis. Intact staples at the lateral aspect of the calcaneus. Partially visualized intact screw at the base of fifth metatarsal. No focal osseous lesions. IMPRESSION: Diffuse right ankle soft tissue swelling. Small soft tissue defect at the lateral malleolus compatible with reported ulcer. Subtle loss of cortical line at the lateral distal margin of the lateral malleolus, suspicious for early bony erosion due to  osteomyelitis. Consider MRI of the right ankle without IV contrast for further evaluation as clinically warranted. Electronically Signed   By: Ilona Sorrel M.D.   On: 10/25/2020 14:24   MR ANKLE RIGHT WO CONTRAST  Result Date: 10/26/2020 CLINICAL DATA:  Lateral right ankle ulceration EXAM: MRI OF THE RIGHT ANKLE WITHOUT CONTRAST TECHNIQUE: Multiplanar, multisequence MR imaging of the ankle was performed. No intravenous contrast was administered. COMPARISON:  X-ray 10/25/2020 FINDINGS: TENDONS Peroneal: Tendinosis with longitudinal split tear of the infra-malleolar aspect of the peroneus brevis tendon. Intact peroneus longus tendon. No tenosynovitis. Posteromedial: Intact tibialis posterior, flexor hallucis longus and flexor digitorum longus tendons. Anterior: Intact tibialis anterior, extensor hallucis longus and extensor digitorum longus tendons. Achilles: Intact. Plantar Fascia: Intact. LIGAMENTS Lateral: Anterior talofibular ligament and calcaneofibular ligaments are not clearly seen, and likely chronically torn. Posterior talofibular ligament is heterogeneous but intact. Intact anterior and posterior tibiofibular ligaments. Medial: Deltoid ligament appears chronically torn. Portions of the spring ligament complex are ill-defined. CARTILAGE Ankle Joint: Severe tibiotalar osteoarthritis with full-thickness cartilage loss most pronounced anteriorly. No joint effusion. Prominent osseous densities along the posterior aspect of the tibiotalar and subtalar joints, largest measuring up to 2.4 cm. Subtalar Joints/Sinus Tarsi: Moderate arthropathy of the posterior subtalar joint. No effusion. Preservation of the anatomic fat within the sinus tarsi. Bones: No acute fracture or dislocation. No bony erosion or cortical destruction. No marrow edema. Well healed calcaneal osteotomy. Metallic susceptibility artifact from hardware within the fifth metatarsal. Other: Soft tissue swelling with superficial ulceration overlying  the lateral malleolus. No organized fluid collection. Diffuse edema-like signal of the visualized foot and lower leg musculature. There is also fatty atrophy of the visualized musculature compatible with chronic denervation. IMPRESSION: 1. Soft tissue swelling with superficial ulceration overlying the lateral malleolus. No organized  fluid collection. No evidence of osteomyelitis. 2. Severe tibiotalar osteoarthritis with prominent osseous densities along the posterior aspect of the tibiotalar and subtalar joints, largest measuring up to 2.4 cm. 3. Fatty atrophy and edema throughout the visualized foot and lower leg musculature, likely representing a combination of denervation changes and/or myositis. Findings are in agreement with the preliminary report provided by Dr. Francoise Ceo. Preliminary report was called to the ordering provider Eustaquio Maize, PA at 5:54 p.m. on 10/25/2020 by Dr. Francoise Ceo. Electronically Signed   By: Davina Poke D.O.   On: 10/26/2020 08:32   ECHOCARDIOGRAM COMPLETE  Result Date: 10/30/2020    ECHOCARDIOGRAM REPORT   Patient Name:   BRIANA NEWMAN Date of Exam: 10/30/2020 Medical Rec #:  [id number]   Height:       73.0 in Accession #:    [id number]  Weight:       279.8 lb Date of Birth:  10/30/1955    BSA:          2.482 m Patient Age:    65 years    BP:           115/71 mmHg Patient Gender: M           HR:           81 bpm. Exam Location:  [name] Procedure: 2D Echo, Cardiac Doppler and Color Doppler Indications:    I48.0 Paroxysmal atrial fibrillation  History:        Patient has prior history of Echocardiogram examinations, most                 recent 02/24/2019. Risk Factors:Hypertension and Diabetes. CKD.  Sonographer:    Jonelle Sidle Dance Referring Phys: [phone] Merrick Maggio  Sonographer Comments: Technically difficult study due to poor echo windows. IMPRESSIONS  1. Left ventricular ejection fraction, by estimation, is 55 to 60%. The left ventricle has normal function. Left ventricular endocardial  border not optimally defined to evaluate regional wall motion. There is moderate left ventricular hypertrophy. Left ventricular diastolic parameters are indeterminate.  2. Right ventricular systolic function is mildly reduced. The right ventricular size is moderately enlarged. There is severely elevated pulmonary artery systolic pressure. The estimated right ventricular systolic pressure is 49.4 mmHg.  3. Left atrial size was mildly dilated.  4. Right atrial size was moderately dilated.  5. The mitral valve is normal in structure. Mild mitral valve regurgitation. No evidence of mitral stenosis.  6. The aortic valve is grossly normal. Aortic valve regurgitation is not visualized. No aortic stenosis is present.  7. The inferior vena cava is dilated in size with <50% respiratory variability, suggesting right atrial pressure of 15 mmHg. FINDINGS  Left Ventricle: Left ventricular ejection fraction, by estimation, is 55 to 60%. The left ventricle has normal function. Left ventricular endocardial border not optimally defined to evaluate regional wall motion. The left ventricular internal cavity size was normal in size. There is moderate left ventricular hypertrophy. Left ventricular diastolic parameters are indeterminate. Right Ventricle: The right ventricular size is moderately enlarged. No increase in right ventricular wall thickness. Right ventricular systolic function is mildly reduced. There is severely elevated pulmonary artery systolic pressure. The tricuspid regurgitant velocity is 3.41 m/s, and with an assumed right atrial pressure of 15 mmHg, the estimated right ventricular systolic pressure is 49.6 mmHg. Left Atrium: Left atrial size was mildly dilated. Right Atrium: Right atrial size was moderately dilated. Pericardium: There is no evidence of pericardial effusion. Mitral Valve: The mitral valve is normal  in structure. Mild mitral valve regurgitation. No evidence of mitral valve stenosis. Tricuspid Valve: The  tricuspid valve is normal in structure. Tricuspid valve regurgitation is not demonstrated. No evidence of tricuspid stenosis. Aortic Valve: The aortic valve is grossly normal. Aortic valve regurgitation is not visualized. No aortic stenosis is present. Pulmonic Valve: The pulmonic valve was normal in structure. Pulmonic valve regurgitation is not visualized. No evidence of pulmonic stenosis. Aorta: The aortic root is normal in size and structure. Venous: The inferior vena cava is dilated in size with less than 50% respiratory variability, suggesting right atrial pressure of 15 mmHg. IAS/Shunts: The interatrial septum was not well visualized.  LEFT VENTRICLE PLAX 2D LVIDd:         4.67 cm  Diastology LVIDs:         3.08 cm  LV e' medial:    6.57 cm/s LV PW:         1.67 cm  LV E/e' medial:  21.6 LV IVS:        1.37 cm  LV e' lateral:   7.36 cm/s LVOT diam:     2.00 cm  LV E/e' lateral: 19.3 LV SV:         54 LV SV Index:   22 LVOT Area:     3.14 cm  RIGHT VENTRICLE RV Basal diam:  3.79 cm RV Mid diam:    3.04 cm RV S prime:     11.10 cm/s TAPSE (M-mode): 1.6 cm LEFT ATRIUM             Index       RIGHT ATRIUM           Index LA diam:        5.00 cm 2.01 cm/m  RA Area:     29.80 cm LA Vol (A2C):   98.6 ml 39.73 ml/m RA Volume:   108.00 ml 43.52 ml/m LA Vol (A4C):   96.5 ml 38.89 ml/m LA Biplane Vol: 99.0 ml 39.89 ml/m  AORTIC VALVE LVOT Vmax:   80.90 cm/s LVOT Vmean:  55.700 cm/s LVOT VTI:    0.172 m  AORTA Ao Root diam: 3.60 cm Ao Asc diam:  3.20 cm MITRAL VALVE                TRICUSPID VALVE MV Area (PHT): 2.96 cm     TR Peak grad:   46.5 mmHg MV Decel Time: 257 msec     TR Vmax:        341.00 cm/s MV E velocity: 142.00 cm/s                             SHUNTS                             Systemic VTI:  0.17 m                             Systemic Diam: 2.00 cm Cherlynn Kaiser MD Electronically signed by Cherlynn Kaiser MD Signature Date/Time: 10/30/2020/4:12:09 PM    Final     Orson Eva, DO  [facility]  If 7PM-7AM, please contact night-coverage [email or web address] Password [email or web address] 11/02/2020, 3:56 PM   LOS: 5 days

## 2020-11-02 NOTE — Progress Notes (Signed)
Patient ID: Danny [name], male   DOB: 1955/07/12, 65 y.o.   MRN: [id number] S: Feels better today O:BP 126/74 (BP Location: Left Arm)   Pulse 74   Temp 98.4 F (36.9 C)   Resp 20   Ht 6\' 1"  (1.854 m)   Wt (!) 136.8 kg   SpO2 95%   BMI 39.79 kg/m   Intake/Output Summary (Last 24 hours) at 11/02/2020 1030 Last data filed at 11/02/2020 0900 Gross per 24 hour  Intake 1664 ml  Output 2800 ml  Net -1136 ml   Intake/Output: I/O last 3 completed shifts: In: 1424 [P.O.:1000; IV Piggyback:424] Out: 4600 [Urine:4600]  Intake/Output this shift:  Total I/O In: 480 [P.O.:480] Out: -  Weight change: 3.8 kg Gen: NAD CVS: RRR Resp: cta Abd: +BS, soft, NT, obese Ext:2+ edema to presacrum  Recent Labs  Lab 10/27/20 0455 10/28/20 0542 10/29/20 0606 10/30/20 0534 10/31/20 0419 11/01/20 0442 11/02/20 0643  NA 137 136 136 134* 133* 134* 135  K 4.1 4.2 4.7 4.8 5.0 4.6 4.7  CL 109 106 109 105 103 104 103  CO2 21* 22 21* 21* 21* 21* 23  GLUCOSE 85 82 106* 88 108* 91 81  BUN 48* 49* 52* 59* 64* 71* 74*  CREATININE 3.81* 3.93* 4.29* 4.73* 4.78* 5.19* 5.15*  ALBUMIN  --   --   --   --   --   --  2.6*  CALCIUM 8.3* 8.5* 8.3* 8.4* 8.1* 8.3* 8.5*  PHOS  --   --   --   --   --   --  5.2*   Liver Function Tests: Recent Labs  Lab 11/02/20 0643  ALBUMIN 2.6*   No results for input(s): LIPASE, AMYLASE in the last 168 hours. No results for input(s): AMMONIA in the last 168 hours. CBC: Recent Labs  Lab 10/29/20 0606 10/30/20 0534 10/31/20 0419 11/01/20 0442 11/02/20 0643  WBC 7.1 6.7 7.0 5.9 5.5  HGB 8.3* 8.6* 8.4* 8.4* 8.4*  HCT 26.1* 27.0* 26.7* 26.6* 25.6*  MCV 86.4 86.3 85.6 85.0 83.1  PLT 174 205 194 215 206   Cardiac Enzymes: No results for input(s): CKTOTAL, CKMB, CKMBINDEX, TROPONINI in the last 168 hours. CBG: Recent Labs  Lab 10/31/20 2041 11/01/20 0716 11/01/20 1122 11/01/20 1604 11/01/20 2031  GLUCAP 150* 82 145* 110* 154*    Iron Studies: No results for  input(s): IRON, TIBC, TRANSFERRIN, FERRITIN in the last 72 hours. Studies/Results: No results found. Marland Kitchen aspirin  325 mg Oral Daily  . cholecalciferol  1,000 Units Oral Daily  . collagenase   Topical Daily  . doxycycline  100 mg Oral Q12H  . epoetin alfa-epbx  8,000 Units Subcutaneous Q14 Days  . feeding supplement (GLUCERNA SHAKE)  237 mL Oral TID BM  . heparin  5,000 Units Subcutaneous Q8H  . hydrALAZINE  100 mg Oral TID  . hydrocerin   Topical Daily  . insulin aspart  0-15 Units Subcutaneous TID WC  . insulin aspart  0-5 Units Subcutaneous QHS  . insulin detemir  20 Units Subcutaneous QHS  . metoprolol succinate  25 mg Oral QPM  . nystatin   Topical TID  . polyethylene glycol  17 g Oral Daily  . pravastatin  80 mg Oral QPM  . senna  2 tablet Oral Daily  . vitamin C  1,000 mg Oral Daily    BMET    Component Value Date/Time   NA 135 11/02/2020 0643   K 4.7 11/02/2020 1224  CL 103 11/02/2020 0643   CO2 23 11/02/2020 0643   GLUCOSE 81 11/02/2020 0643   BUN 74 (H) 11/02/2020 0643   CREATININE 5.15 (H) 11/02/2020 0643   CALCIUM 8.5 (L) 11/02/2020 0643   CALCIUM 8.8 03/13/2019 0700   GFRNONAA 12 (L) 11/02/2020 0643   GFRAA 17 (L) 03/30/2020 0620   CBC    Component Value Date/Time   WBC 5.5 11/02/2020 0643   RBC 3.08 (L) 11/02/2020 0643   HGB 8.4 (L) 11/02/2020 0643   HCT 25.6 (L) 11/02/2020 0643   PLT 206 11/02/2020 0643   MCV 83.1 11/02/2020 0643   MCH 27.3 11/02/2020 0643   MCHC 32.8 11/02/2020 0643   RDW 16.9 (H) 11/02/2020 0643   LYMPHSABS 0.6 (L) 10/25/2020 1327   MONOABS 0.5 10/25/2020 1327   EOSABS 0.3 10/25/2020 1327   BASOSABS 0.0 10/25/2020 1327    Assessment/Plan:  1. AKI/CKD stage IV- pt with significant proteinuria and progressive CKD thought to be due to DM.  Followed by Dr. Theador Hawthorne at [facility] and had been referred to [facility] in March, however he did not keep appointment.  Baseline Scr 2.7-3.96.  BUN/Cr have been climbing with diuresis and likely component  of cardiorenal syndrome.  Discussed possible need to initiate HD during this hospitalization.   1. Will continue with IV lasix and follow UOP and BUN/Cr which are stable  2. Currently without uremic symptoms. 3. Continue to follow for signs of uremia 4. Will need AVF/AVG placed as soon as possible given progressive CKD 2. Bilateral lower extremity cellulitis with open wound on ankle and chronic venous stasis dermatitis.  Wound care following.  Biopsy taken and started on IV abx. 3. New onset atrial fibrillation - on metoprolol and apixaban per primary svc 4. DM - per primary 5. MOrbid obesity 6. Moderate protein malnutrition - per primary. 7. Anemia of CKD - transfuse prn and will likely require ESA.   [name], MD [employer] [phone]

## 2020-11-02 NOTE — TOC Progression Note (Signed)
Transition of Care [facility]) - Progression Note    Patient Details  Name: Danny [name] MRN: [id number] Date of Birth: 12-31-1955  Transition of [facility]) CM/SW Contact  Boneta Lucks, RN Phone Number: 11/02/2020, 10:13 AM  Clinical Narrative:   [name] made a bed offer,patient is accepting. [name] will start insurance authorization with [employer]. DC plan for Thursday pending renal functions.    Expected Discharge Plan: Skilled Nursing Facility Barriers to Discharge: Continued Medical Work up  Expected Discharge Plan and Services Expected Discharge Plan: [place] arrangements for the past 2 months: Apartment       [place]: RN,PT [place]: [facility] (now [facility]) Date [place]: 10/26/20 Time [place]: [place] Representative spoke with at [place]: Verdie Shire   Readmission Risk Interventions Readmission Risk Prevention Plan 03/24/2020 07/11/2019 03/05/2019  Transportation Screening Complete Complete Complete  PCP or Specialist Appt within 3-5 Days Not Complete - Not Complete  Not Complete comments - - Going to facility and will be seen by facility medical director.  [place] or Home Care Consult Complete - Complete  Social Work Consult for Recovery Care Planning/Counseling Complete - Complete  Palliative Care Screening Not Applicable - Not Applicable  Medication Review (RN Care Manager) Complete Complete Complete  PCP or Specialist appointment within 3-5 days of discharge - Not Complete -  [place] or Home Care Consult - Complete -  SW Recovery Care/Counseling Consult - Complete -  Palliative Care Screening - Not Complete -  [place] - Complete -  Some recent data might be hidden

## 2020-11-02 NOTE — Progress Notes (Signed)
Physical Therapy Treatment Patient Details Name: Shen[place]ah Vandergriff MRN: [id number] DOB: 06/15/1956 Today's Date: 11/02/2020    History of Present Illness Ramie Erman is a 65 y.o. male presents with c/o leg swelling and weakness, venous stasis changes to legs for years. R ankle MRI negaitve for osteomyelitis. PMH: HTN, diabetes, CKD, anemia, TURP 12/2019    PT Comments    Patient presents up in chair (assisted by nursing staff using RW with bed elevated) and agreeable for therapy.  Patient demonstrates increased tolerance/endurance for completing BLE exercises while seated in chair, able to complete sit to stands x 3 trials, stood for up to 3 minutes before having to sit due to fatigue.  Patient unable to take steps using RW due weakness and having to rest right forearm on RW when standing.  Patient tolerated staying up in chair after therapy.  Patient will benefit from continued physical therapy in hospital and recommended venue below to increase strength, balance, endurance for safe ADLs and gait.   Follow Up Recommendations  SNF     Equipment Recommendations  None recommended by PT    Recommendations for Other Services       Precautions / Restrictions Precautions Precautions: Fall Precaution Comments: wounds Restrictions Weight Bearing Restrictions: No    Mobility  Bed Mobility               General bed mobility comments: Presents in chair, assisted by nursing staff using RW    Transfers Overall transfer level: Needs assistance Equipment used: Rolling walker (2 wheeled) Transfers: Sit to/from Stand Sit to Stand: Mod assist         General transfer comment: Tolerated sit to stands x 3  Ambulation/Gait                 Stairs             Wheelchair Mobility    Modified Rankin (Stroke Patients Only)       Balance Overall balance assessment: Needs assistance Sitting-balance support: Feet supported;No upper extremity supported Sitting balance-Leahy  Scale: Good Sitting balance - Comments: seated edge of chair   Standing balance support: During functional activity;Bilateral upper extremity supported Standing balance-Leahy Scale: Poor Standing balance comment: fair/poor using RW, has to lean on walker with right forearm when standing                            Cognition Arousal/Alertness: Awake/alert Behavior During Therapy: WFL for tasks assessed/performed Overall Cognitive Status: Within Functional Limits for tasks assessed                                        Exercises General Exercises - Lower Extremity Long Arc Quad: Seated;AROM;Strengthening;Both;10 reps Hip Flexion/Marching: Seated;AROM;Strengthening;Both;10 reps Toe Raises: Seated;AROM;Strengthening;Both;Other reps (comment) (30 reps) Heel Raises: Seated;AROM;Right;Other reps (comment) (30 reps)    General Comments        Pertinent Vitals/Pain Pain Assessment: No/denies pain    Home Living                      Prior Function            PT Goals (current goals can now be found in the care plan section) Acute Rehab PT Goals Patient Stated Goal: "return home" PT Goal Formulation: With patient Time For Goal Achievement: 11/09/20 Potential to Achieve  Goals: Good Progress towards PT goals: Progressing toward goals    Frequency    Min 3X/week      PT Plan Current plan remains appropriate    Co-evaluation              AM-PAC PT "6 Clicks" Mobility   Outcome Measure  Help needed turning from your back to your side while in a flat bed without using bedrails?: A Little Help needed moving from lying on your back to sitting on the side of a flat bed without using bedrails?: A Little Help needed moving to and from a bed to a chair (including a wheelchair)?: A Lot Help needed standing up from a chair using your arms (e.g., wheelchair or bedside chair)?: A Lot Help needed to walk in hospital room?: Total Help needed  climbing 3-5 steps with a railing? : Total 6 Click Score: 12    End of Session   Activity Tolerance: Patient tolerated treatment well;Patient limited by fatigue Patient left: in chair;with call bell/phone within reach Nurse Communication: Mobility status PT Visit Diagnosis: Unsteadiness on feet (R26.81);Other abnormalities of gait and mobility (R26.89);Muscle weakness (generalized) (M62.81)     Time: 2947-6546 PT Time Calculation (min) (ACUTE ONLY): 33 min  Charges:  $Therapeutic Exercise: 8-22 mins $Therapeutic Activity: 8-22 mins                     2:01 PM, 11/02/20 Lonell Grandchild, MPT Physical Therapist with [facility]: [place] [phone] office [phone] mobile phone

## 2020-11-03 DIAGNOSIS — M869 Osteomyelitis, unspecified: Secondary | ICD-10-CM

## 2020-11-03 DIAGNOSIS — E440 Moderate protein-calorie malnutrition: Secondary | ICD-10-CM | POA: Diagnosis not present

## 2020-11-03 DIAGNOSIS — E1122 Type 2 diabetes mellitus with diabetic chronic kidney disease: Secondary | ICD-10-CM | POA: Diagnosis not present

## 2020-11-03 DIAGNOSIS — N179 Acute kidney failure, unspecified: Secondary | ICD-10-CM | POA: Diagnosis not present

## 2020-11-03 DIAGNOSIS — N184 Chronic kidney disease, stage 4 (severe): Secondary | ICD-10-CM | POA: Diagnosis not present

## 2020-11-03 LAB — RENAL FUNCTION PANEL
Albumin: 2.7 g/dL — ABNORMAL LOW (ref 3.5–5.0)
Anion gap: 10 (ref 5–15)
BUN: 80 mg/dL — ABNORMAL HIGH (ref 8–23)
CO2: 23 mmol/L (ref 22–32)
Calcium: 8.8 mg/dL — ABNORMAL LOW (ref 8.9–10.3)
Chloride: 102 mmol/L (ref 98–111)
Creatinine, Ser: 5.2 mg/dL — ABNORMAL HIGH (ref 0.61–1.24)
GFR, Estimated: 11 mL/min — ABNORMAL LOW (ref >60)
Glucose, Bld: 88 mg/dL (ref 70–99)
Phosphorus: 5 mg/dL — ABNORMAL HIGH (ref 2.5–4.6)
Potassium: 4.1 mmol/L (ref 3.5–5.1)
Sodium: 135 mmol/L (ref 135–145)

## 2020-11-03 LAB — GLUCOSE, CAPILLARY
Glucose-Capillary: 82 mg/dL (ref 70–99)
Glucose-Capillary: 134 mg/dL — ABNORMAL HIGH (ref 70–99)
Glucose-Capillary: 172 mg/dL — ABNORMAL HIGH (ref 70–99)
Glucose-Capillary: 141 mg/dL — ABNORMAL HIGH (ref 70–99)

## 2020-11-03 MED ORDER — NEPRO/CARBSTEADY PO LIQD
237.0000 mL | Freq: Two times a day (BID) | ORAL | Status: DC
  Administered 2020-11-05: 237 mL via ORAL

## 2020-11-03 NOTE — Progress Notes (Signed)
PROGRESS NOTE  Danny [name] ZSM:[id number] DOB: 03-08-1956 DOA: 10/25/2020 PCP: Celene Squibb, MD   Brief History: 65 y/owith a history of hypertension, diabetes mellitus type 2, CKD stage III, psoriasis, venous stasis dermatitis, lymphedema presenting with 2-weekhistory of his legs edema, pain, erythema and becoming weak. He reports he was placed on a 2 week course of doxy in the beginning of April with some improvement, but his legs have worsened since finishing doxy 1-2 weeks prior to this admission. He states he is WC bound and has not ambulated since July 2021. He reports his legs have been draining yellow clear and cloudy fluid.  In the ED Temp 97.5, heart rate 81, respiratory rate 16-18, blood pressure 132/76, satting at 97% No leukocytosis with white blood cell count of 7.4, hemoglobin 8.9 Chemistry panel reveals an elevated BUN and creatinine that are close to his baseline at 47 and 3.79 Glucose is 151 UA is not indicative of UTI CRP is 3.4, sed rate is 69 COVIDneg Blood cultures neg EKG shows a heart rate of 97, QTc 477, PVCs Chest x-ray is negative Alk phos is elevated 131 Admission was requested to antibiotics and wound care to patient. Does not have reliable meansto follow-up outpatient -patient was started on abx with some clinical improvement.  He was started on IV lasix for his anasarca.  Nephrology was consulted.  His serum creatinine is slowly rising after starting on IV lasix.  Assessment/Plan: Bilateral lower extremity cellulitis-ankle open wound, chronic venous stasisdermatitis 1. Likely a component of cellulitis and a component of venous stasis contributing to the open wounds on patient's leg; Failed outpatient antibiotics 2. Wound care consulted, also general surgery consulted for evaluation possible debridement. Appreciate Dr. Pamelia Hoit taken. Patient not interested in surgery or amputation at this time.  3. Patient was initiated on  vancomycin, due to acute on chronic kidney disease, switch to linezolidinitially. 4. Came to the ER earlier in April and was started on doxycycline, reports taking all of it but then having no way to follow-up with any provider secondary to transportation barriers, now legs worsening 5. Vancomycin started in the ED.Marland KitchenMarland KitchenDiscontinued 10/26/2020 6. Blood cultures>>>>neg to date 7. MRI ankle--no abscess, no osteomyelitis. Fatty atrophy of visualized musculature = chronic devervation 8. CRP 3.4, ESR 69 9. D/c zyvox. Start cefazolin anddoxy 5/4 10. Appreciate general surgery consult-->bx and culture done 5/3 11. SW/TOC assisting with setting up transport with [facility] appointment with outpatient wound care clinic 12. Right heel repeat biopsy by Dr. Constance Haw b/c cannot r/o SCC on prior bx on 10/26/20; repeat biopsy pending. Might need outpatient follow up with terciary facility podiatrist for further eval/management.   New Onset Atrial fibrillation -personally reviewed EKG--Afib, nonspecific T wave change -rate controlled -CHADS-VASc = 5 (CHF, HTN, Age, DM, ASVD) -continue metoprolol succinate -started on apixaban -5/7EchoEF 55-60%; mild decrease RV fxn, RVSP 61.5 -TSH 2.028  Acute on chronicCKD 4 -Trend BUN/creatinine--uptrending -still appear volume overloaded -baseline creatinine 3.7-4.0 -serum creatinine up to 4.78>>5.19>>5.15>>5.20 -renal consult appreciated-discussed with Dr. Arty Baumgartner -continue current dose of IV lasix -suspect he may end on requiring HD at some point, possible during this admission.   Fall and Generalized weakness 1. Mechanical fall no LOC, did not hit head 2. Consult PT-->SNF  DMII-uncontrolledwith hyperglycemia 1. CBG QA CHS, SSI coverage, 2. Takes 70/30 20-100 unitsat home 3. Continue Levemir and SSI 4. A1c:7.6 5. Carb modified diet  Moderate protein cal malnutrition 1. Albumin 2.8 2.  Counseled on the importance of nutrient  dense diet; and appropriate intake of protein.  Will add Nepro BID. 3. Continue to monitor  Medical non compliance 1. TOC consulted for possible PT at home?Any assistance with transportation for medical appointments to be explored at time of discharge.  Class 2 Obesity -Body mass index is 39.76 kg/m. -Low calorie diet, portion control and increase physical activity has been discussed with patient.  Atypical Chest pain -had CP 10/30/20 -No further chest pain reported -EKG without acute ischemic changes -No abnormalities appreciated on telemetry -Troponin has been negative.    Status ZY:YQMGNOIBB   Dispo: The patient is from:Home Anticipated d/c is CW:UGQB Patient currently is not medically stable to d/c. Difficult to place patient no    Family Communication:noFamily at bedside  Consultants:none  Code Status: FULL  DVT Prophylaxis: [place] Heparin    Procedures: As Listed in Progress Note Above  Antibiotics: zyvox 5/3>>5/4 Cefazolin 5/4>> Doxy 5/4>>    Subjective: No chest pain, no nausea, no vomiting.  Reports feeling better and reports good urine output.    Objective: Vitals:   11/03/20 0500 11/03/20 0600 11/03/20 0722 11/03/20 1303  BP: 140/77  134/77 (!) 152/86  Pulse: 89  (!) 54 93  Resp: 20  16 18   Temp: 98.3 F (36.8 C)  97.8 F (36.6 C) 98.7 F (37.1 C)  TempSrc: Oral  Oral   SpO2:   96% 99%  Weight:  (!) 136.7 kg    Height:        Intake/Output Summary (Last 24 hours) at 11/03/2020 1833 Last data filed at 11/03/2020 1645 Gross per 24 hour  Intake 360 ml  Output 1850 ml  Net -1490 ml   Weight change: -0.1 kg  Exam: General exam: Alert, awake, oriented x 3; reports feeling better and overall breathing easier.  Good urine output has been reported.  No fever, no nausea, no vomiting. Respiratory system: Positive rhonchi bilaterally; no frank crackles, no wheezing, no using  accessory muscles. Cardiovascular system: RRR, no rubs, no gallops, unable to properly assess JVD due to body habitus. Gastrointestinal system: Abdomen is obese, nondistended, soft and nontender. No guarding; positive bowel sounds. Central nervous system: Alert and oriented. No focal neurological deficits. Extremities: No cyanosis or clubbing; 2+ edema appreciated bilaterally.  Positive erythematous changes and excessive dermatitis.  Currently clean dressings and wrap in place. Skin: No petechiae. Psychiatry: Judgement and insight appear normal. Mood & affect appropriate.    Data Reviewed: I have personally reviewed following labs and imaging studies  Basic Metabolic Panel: Recent Labs  Lab 10/30/20 0534 10/31/20 0419 11/01/20 0442 11/02/20 0643 11/03/20 0514  NA 134* 133* 134* 135 135  K 4.8 5.0 4.6 4.7 4.1  CL 105 103 104 103 102  CO2 21* 21* 21* 23 23  GLUCOSE 88 108* 91 81 88  BUN 59* 64* 71* 74* 80*  CREATININE 4.73* 4.78* 5.19* 5.15* 5.20*  CALCIUM 8.4* 8.1* 8.3* 8.5* 8.8*  MG  --   --  2.2  --   --   PHOS  --   --   --  5.2* 5.0*   Liver Function Tests: Recent Labs  Lab 11/02/20 0643 11/03/20 0514  ALBUMIN 2.6* 2.7*   CBC: Recent Labs  Lab 10/29/20 0606 10/30/20 0534 10/31/20 0419 11/01/20 0442 11/02/20 0643  WBC 7.1 6.7 7.0 5.9 5.5  HGB 8.3* 8.6* 8.4* 8.4* 8.4*  HCT 26.1* 27.0* 26.7* 26.6* 25.6*  MCV 86.4 86.3 85.6 85.0 83.1  PLT  174 205 194 215 206   CBG: Recent Labs  Lab 11/02/20 1629 11/02/20 1940 11/03/20 0714 11/03/20 1107 11/03/20 1606  GLUCAP 173* 140* 82 134* 172*   Urine analysis:    Component Value Date/Time   COLORURINE YELLOW 10/25/2020 1516   APPEARANCEUR CLEAR 10/25/2020 1516   APPEARANCEUR Clear 09/10/2020 1122   LABSPEC 1.010 10/25/2020 1516   PHURINE 6.0 10/25/2020 1516   GLUCOSEU 150 (A) 10/25/2020 1516   HGBUR SMALL (A) 10/25/2020 1516   BILIRUBINUR NEGATIVE 10/25/2020 1516   BILIRUBINUR Negative 09/10/2020 1122    KETONESUR NEGATIVE 10/25/2020 1516   PROTEINUR >=300 (A) 10/25/2020 1516   NITRITE NEGATIVE 10/25/2020 1516   LEUKOCYTESUR NEGATIVE 10/25/2020 1516   Sepsis Labs:  Recent Results (from the past 240 hour(s))  Aerobic Culture w Gram Stain (superficial specimen)     Status: Abnormal   Collection Time: 10/25/20  3:29 PM   Specimen: Skin, Cyst/Tag/Debridement; Wound  Result Value Ref Range Status   Specimen Description   Final    SKIN Performed at [facility] [address], [place], [place] [address]    Special Requests   Final    NONE Performed at [facility], [address] [place], [place] [address]    Gram Stain   Final    RARE WBC PRESENT, PREDOMINANTLY PMN RARE GRAM POSITIVE COCCI IN PAIRS RARE GRAM NEGATIVE RODS Performed at [facility], [place] [address] [place], [place] [address]    Culture MULTIPLE ORGANISMS PRESENT, NONE PREDOMINANT (A)  Final   Report Status 10/27/2020 FINAL  Final  Culture, blood (routine x 2)     Status: None   Collection Time: 10/25/20  3:59 PM   Specimen: BLOOD  Result Value Ref Range Status   Specimen Description BLOOD LEFT ARM  Final   Special Requests   Final    BOTTLES DRAWN AEROBIC AND ANAEROBIC Blood Culture adequate volume   Culture   Final    NO GROWTH 5 DAYS Performed at [facility], [address], [place], [place] [address]    Report Status 10/30/2020 FINAL  Final  Culture, blood (routine x 2)     Status: None   Collection Time: 10/25/20  3:59 PM   Specimen: BLOOD  Result Value Ref Range Status   Specimen Description BLOOD RIGHT ARM  Final   Special Requests   Final    BOTTLES DRAWN AEROBIC AND ANAEROBIC Blood Culture results may not be optimal due to an excessive volume of blood received in culture bottles   Culture   Final    NO GROWTH 5 DAYS Performed at [facility], [address] [place], [address] [address]    Report Status 10/30/2020 FINAL  Final  SARS CORONAVIRUS 2 (TAT 6-24 HRS) Nasopharyngeal  Nasopharyngeal Swab     Status: None   Collection Time: 10/25/20  4:30 PM   Specimen: Nasopharyngeal Swab  Result Value Ref Range Status   SARS Coronavirus 2 NEGATIVE NEGATIVE Final    Comment: (NOTE) SARS-CoV-2 target nucleic acids are NOT DETECTED.  The SARS-CoV-2 RNA is generally detectable in upper and lower respiratory specimens during the acute phase of infection. Negative results do not preclude SARS-CoV-2 infection, do not rule out co-infections with other pathogens, and should not be used as the sole basis for treatment or other patient management decisions. Negative results must be combined with clinical observations, patient history, and epidemiological information. The expected result is Negative.  Fact Sheet for Patients: SugarRoll.be  Fact Sheet for Healthcare Providers: https://www.woods-mathews.com/  This test is not yet approved or cleared by the [place] FDA and  has been authorized for detection and/or diagnosis of SARS-CoV-2 by FDA under an Emergency Use Authorization (EUA). This EUA will remain  in effect (meaning this test can be used) for the duration of the COVID-19 declaration under Se ction 564(b)(1) of the Act, 21 U.S.C. section 360bbb-3(b)(1), unless the authorization is terminated or revoked sooner.  Performed at [facility], [place] [address] [place], [place] [address]   Aerobic Culture w Gram Stain (superficial specimen)     Status: Abnormal   Collection Time: 10/25/20  5:10 PM   Specimen: Ankle; Wound  Result Value Ref Range Status   Specimen Description   Final    ANKLE Performed at [facility], [address], [place], [place] [address]    Special Requests   Final    NONE Performed at [place] [place], [place] [address]    Gram Stain   Final    RARE WBC PRESENT, PREDOMINANTLY PMN MODERATE GRAM POSITIVE COCCI IN PAIRS IN CLUSTERS MODERATE GRAM NEGATIVE RODS RARE  GRAM POSITIVE RODS Performed at [facility], [place] [address], [place], [place] [address]    Culture MULTIPLE ORGANISMS PRESENT, NONE PREDOMINANT (A)  Final   Report Status 10/28/2020 FINAL  Final  Fungus Culture With Stain     Status: None (Preliminary result)   Collection Time: 10/26/20  3:59 PM   Specimen: Ankle  Result Value Ref Range Status   Fungus Stain Final report  Final    Comment: (NOTE) Performed At: [facility] [address] [place], [place] [id number] Rush Farmer MD [id number]    Fungus (Mycology) Culture PENDING  Incomplete   Fungal Source ANKLE  Final    Comment: WOUND Performed at [facility], [address] [place], [place] [address]   Aerobic/Anaerobic Culture w Gram Stain (surgical/deep wound)     Status: Abnormal   Collection Time: 10/26/20  3:59 PM   Specimen: Tissue  Result Value Ref Range Status   Specimen Description TISSUE RIGHT ANKLE  Final   Special Requests NONE  Final   Gram Stain   Final    MODERATE WBC PRESENT,BOTH PMN AND MONONUCLEAR FEW SQUAMOUS EPITHELIAL CELLS PRESENT ABUNDANT GRAM NEGATIVE RODS MODERATE GRAM POSITIVE COCCI IN PAIRS MODERATE GRAM POSITIVE RODS    Culture (A)  Final    MULTIPLE ORGANISMS PRESENT, NONE PREDOMINANT NO GROUP A STREP (S.PYOGENES) ISOLATED NO STAPHYLOCOCCUS AUREUS ISOLATED NO ANAEROBES ISOLATED Performed at [facility], [place] [address], [place], [place] [address]    Report Status 11/01/2020 FINAL  Final  Fungus Culture Result     Status: None   Collection Time: 10/26/20  3:59 PM  Result Value Ref Range Status   Result 1 Comment  Final    Comment: (NOTE) KOH/Calcofluor preparation:  no fungus observed. Performed At: [facility] [place], [place] [id number] Rush Farmer MD [id number]      Scheduled Meds: . aspirin  325 mg Oral Daily  . cholecalciferol  1,000 Units Oral Daily  . collagenase   Topical Daily  . doxycycline  100 mg Oral Q12H  .  epoetin alfa-epbx  8,000 Units Subcutaneous Q14 Days  . feeding supplement (GLUCERNA SHAKE)  237 mL Oral TID BM  . heparin  5,000 Units Subcutaneous Q8H  . hydrALAZINE  100 mg Oral TID  . hydrocerin  Topical Daily  . insulin aspart  0-15 Units Subcutaneous TID WC  . insulin aspart  0-5 Units Subcutaneous QHS  . insulin detemir  20 Units Subcutaneous QHS  . metoprolol succinate  25 mg Oral QPM  . nystatin   Topical TID  . polyethylene glycol  17 g Oral Daily  . pravastatin  80 mg Oral QPM  . senna  2 tablet Oral Daily  . vitamin C  1,000 mg Oral Daily   Continuous Infusions: .  ceFAZolin (ANCEF) IV 1 g (11/03/20 1000)  . furosemide Stopped (11/03/20 0927)    Procedures/Studies: DG Chest 2 View  Result Date: 10/25/2020 CLINICAL DATA:  CHF with leg swelling and ulcer to right lateral malleolus. EXAM: CHEST - 2 VIEW COMPARISON:  09/26/20 FINDINGS: There is mild cardiac enlargement, stable from previous exam. No pleural effusion or interstitial edema identified. No airspace densities. IMPRESSION: No acute findings.  No evidence for congestive heart failure. Electronically Signed   By: Kerby Moors M.D.   On: 10/25/2020 14:21   DG Ankle Complete Right  Result Date: 10/25/2020 CLINICAL DATA:  CHF, lower extremity swelling, lateral malleolar ulcer with concern for infection, no reported injury EXAM: RIGHT ANKLE - COMPLETE 3+ VIEW COMPARISON:  09/26/2020 right ankle radiographs FINDINGS: Diffuse right ankle soft tissue swelling. Small soft tissue defect at the lateral malleolus compatible with reported ulcer. Subtle loss of cortical line at the lateral distal margin of the lateral malleolus suspicious for early bony erosion. No fracture. No subluxation. Severe tibiotalar osteoarthritis. Intact staples at the lateral aspect of the calcaneus. Partially visualized intact screw at the base of fifth metatarsal. No focal osseous lesions. IMPRESSION: Diffuse right ankle soft tissue swelling. Small soft  tissue defect at the lateral malleolus compatible with reported ulcer. Subtle loss of cortical line at the lateral distal margin of the lateral malleolus, suspicious for early bony erosion due to osteomyelitis. Consider MRI of the right ankle without IV contrast for further evaluation as clinically warranted. Electronically Signed   By: Ilona Sorrel M.D.   On: 10/25/2020 14:24   MR ANKLE RIGHT WO CONTRAST  Result Date: 10/26/2020 CLINICAL DATA:  Lateral right ankle ulceration EXAM: MRI OF THE RIGHT ANKLE WITHOUT CONTRAST TECHNIQUE: Multiplanar, multisequence MR imaging of the ankle was performed. No intravenous contrast was administered. COMPARISON:  X-ray 10/25/2020 FINDINGS: TENDONS Peroneal: Tendinosis with longitudinal split tear of the infra-malleolar aspect of the peroneus brevis tendon. Intact peroneus longus tendon. No tenosynovitis. Posteromedial: Intact tibialis posterior, flexor hallucis longus and flexor digitorum longus tendons. Anterior: Intact tibialis anterior, extensor hallucis longus and extensor digitorum longus tendons. Achilles: Intact. Plantar Fascia: Intact. LIGAMENTS Lateral: Anterior talofibular ligament and calcaneofibular ligaments are not clearly seen, and likely chronically torn. Posterior talofibular ligament is heterogeneous but intact. Intact anterior and posterior tibiofibular ligaments. Medial: Deltoid ligament appears chronically torn. Portions of the spring ligament complex are ill-defined. CARTILAGE Ankle Joint: Severe tibiotalar osteoarthritis with full-thickness cartilage loss most pronounced anteriorly. No joint effusion. Prominent osseous densities along the posterior aspect of the tibiotalar and subtalar joints, largest measuring up to 2.4 cm. Subtalar Joints/Sinus Tarsi: Moderate arthropathy of the posterior subtalar joint. No effusion. Preservation of the anatomic fat within the sinus tarsi. Bones: No acute fracture or dislocation. No bony erosion or cortical  destruction. No marrow edema. Well healed calcaneal osteotomy. Metallic susceptibility artifact from hardware within the fifth metatarsal. Other: Soft tissue swelling with superficial ulceration overlying the lateral malleolus. No organized fluid collection. Diffuse edema-like signal of  the visualized foot and lower leg musculature. There is also fatty atrophy of the visualized musculature compatible with chronic denervation. IMPRESSION: 1. Soft tissue swelling with superficial ulceration overlying the lateral malleolus. No organized fluid collection. No evidence of osteomyelitis. 2. Severe tibiotalar osteoarthritis with prominent osseous densities along the posterior aspect of the tibiotalar and subtalar joints, largest measuring up to 2.4 cm. 3. Fatty atrophy and edema throughout the visualized foot and lower leg musculature, likely representing a combination of denervation changes and/or myositis. Findings are in agreement with the preliminary report provided by Dr. Francoise Ceo. Preliminary report was called to the ordering provider Eustaquio Maize, PA at 5:54 p.m. on 10/25/2020 by Dr. Francoise Ceo. Electronically Signed   By: Davina Poke D.O.   On: 10/26/2020 08:32   ECHOCARDIOGRAM COMPLETE  Result Date: 10/30/2020    ECHOCARDIOGRAM REPORT   Patient Name:   GEOVANI TOOTLE Date of Exam: 10/30/2020 Medical Rec #:  [id number]   Height:       73.0 in Accession #:    [id number]  Weight:       279.8 lb Date of Birth:  [birth year]    BSA:          2.482 m Patient Age:    66 years    BP:           115/71 mmHg Patient Gender: M           HR:           81 bpm. Exam Location:  [name] Procedure: 2D Echo, Cardiac Doppler and Color Doppler Indications:    I48.0 Paroxysmal atrial fibrillation  History:        Patient has prior history of Echocardiogram examinations, most                 recent 02/24/2019. Risk Factors:Hypertension and Diabetes. CKD.  Sonographer:    Jonelle Sidle Dance Referring Phys: [phone] DAVID TAT  Sonographer  Comments: Technically difficult study due to poor echo windows. IMPRESSIONS  1. Left ventricular ejection fraction, by estimation, is 55 to 60%. The left ventricle has normal function. Left ventricular endocardial border not optimally defined to evaluate regional wall motion. There is moderate left ventricular hypertrophy. Left ventricular diastolic parameters are indeterminate.  2. Right ventricular systolic function is mildly reduced. The right ventricular size is moderately enlarged. There is severely elevated pulmonary artery systolic pressure. The estimated right ventricular systolic pressure is 35.0 mmHg.  3. Left atrial size was mildly dilated.  4. Right atrial size was moderately dilated.  5. The mitral valve is normal in structure. Mild mitral valve regurgitation. No evidence of mitral stenosis.  6. The aortic valve is grossly normal. Aortic valve regurgitation is not visualized. No aortic stenosis is present.  7. The inferior vena cava is dilated in size with <50% respiratory variability, suggesting right atrial pressure of 15 mmHg. FINDINGS  Left Ventricle: Left ventricular ejection fraction, by estimation, is 55 to 60%. The left ventricle has normal function. Left ventricular endocardial border not optimally defined to evaluate regional wall motion. The left ventricular internal cavity size was normal in size. There is moderate left ventricular hypertrophy. Left ventricular diastolic parameters are indeterminate. Right Ventricle: The right ventricular size is moderately enlarged. No increase in right ventricular wall thickness. Right ventricular systolic function is mildly reduced. There is severely elevated pulmonary artery systolic pressure. The tricuspid regurgitant velocity is 3.41 m/s, and with an assumed right atrial pressure of 15 mmHg, the estimated right ventricular systolic pressure  is 61.5 mmHg. Left Atrium: Left atrial size was mildly dilated. Right Atrium: Right atrial size was moderately  dilated. Pericardium: There is no evidence of pericardial effusion. Mitral Valve: The mitral valve is normal in structure. Mild mitral valve regurgitation. No evidence of mitral valve stenosis. Tricuspid Valve: The tricuspid valve is normal in structure. Tricuspid valve regurgitation is not demonstrated. No evidence of tricuspid stenosis. Aortic Valve: The aortic valve is grossly normal. Aortic valve regurgitation is not visualized. No aortic stenosis is present. Pulmonic Valve: The pulmonic valve was normal in structure. Pulmonic valve regurgitation is not visualized. No evidence of pulmonic stenosis. Aorta: The aortic root is normal in size and structure. Venous: The inferior vena cava is dilated in size with less than 50% respiratory variability, suggesting right atrial pressure of 15 mmHg. IAS/Shunts: The interatrial septum was not well visualized.  LEFT VENTRICLE PLAX 2D LVIDd:         4.67 cm  Diastology LVIDs:         3.08 cm  LV e' medial:    6.57 cm/s LV PW:         1.67 cm  LV E/e' medial:  21.6 LV IVS:        1.37 cm  LV e' lateral:   7.36 cm/s LVOT diam:     2.00 cm  LV E/e' lateral: 19.3 LV SV:         54 LV SV Index:   22 LVOT Area:     3.14 cm  RIGHT VENTRICLE RV Basal diam:  3.79 cm RV Mid diam:    3.04 cm RV S prime:     11.10 cm/s TAPSE (M-mode): 1.6 cm LEFT ATRIUM             Index       RIGHT ATRIUM           Index LA diam:        5.00 cm 2.01 cm/m  RA Area:     29.80 cm LA Vol (A2C):   98.6 ml 39.73 ml/m RA Volume:   108.00 ml 43.52 ml/m LA Vol (A4C):   96.5 ml 38.89 ml/m LA Biplane Vol: 99.0 ml 39.89 ml/m  AORTIC VALVE LVOT Vmax:   80.90 cm/s LVOT Vmean:  55.700 cm/s LVOT VTI:    0.172 m  AORTA Ao Root diam: 3.60 cm Ao Asc diam:  3.20 cm MITRAL VALVE                TRICUSPID VALVE MV Area (PHT): 2.96 cm     TR Peak grad:   46.5 mmHg MV Decel Time: 257 msec     TR Vmax:        341.00 cm/s MV E velocity: 142.00 cm/s                             SHUNTS                             Systemic  VTI:  0.17 m                             Systemic Diam: 2.00 cm Cherlynn Kaiser MD Electronically signed by Cherlynn Kaiser MD Signature Date/Time: 10/30/2020/4:12:09 PM    Final     Barton Dubois, MD  [facility]  If 7PM-7AM, please contact night-coverage  [email or web address] Password [facility] 11/03/2020, 6:33 PM   LOS: 6 days

## 2020-11-03 NOTE — Progress Notes (Signed)
Patient ID: Danny [name], male   DOB: September 20, 1955, 65 y.o.   MRN: [id number] S:No new complaints.  Feels better. O:BP 134/77 (BP Location: Right Arm)   Pulse (!) 54   Temp 97.8 F (36.6 C) (Oral)   Resp 16   Ht 6\' 1"  (1.854 m)   Wt (!) 136.7 kg   SpO2 96%   BMI 39.76 kg/m   Intake/Output Summary (Last 24 hours) at 11/03/2020 1059 Last data filed at 11/02/2020 2300 Gross per 24 hour  Intake 240 ml  Output 2450 ml  Net -2210 ml   Intake/Output: I/O last 3 completed shifts: In: 1290 [P.O.:1240; IV Piggyback:50] Out: 1696 [Urine:4900]  Intake/Output this shift:  No intake/output data recorded. Weight change: -0.1 kg Gen: NAD CVS: bradycardic at 54, no rub  Resp: CTA Abd: Obese, +BS, soft, NT Ext: 2+ edema to thighs bilaterally.  Recent Labs  Lab 10/28/20 0542 10/29/20 0606 10/30/20 0534 10/31/20 0419 11/01/20 0442 11/02/20 0643 11/03/20 0514  NA 136 136 134* 133* 134* 135 135  K 4.2 4.7 4.8 5.0 4.6 4.7 4.1  CL 106 109 105 103 104 103 102  CO2 22 21* 21* 21* 21* 23 23  GLUCOSE 82 106* 88 108* 91 81 88  BUN 49* 52* 59* 64* 71* 74* 80*  CREATININE 3.93* 4.29* 4.73* 4.78* 5.19* 5.15* 5.20*  ALBUMIN  --   --   --   --   --  2.6* 2.7*  CALCIUM 8.5* 8.3* 8.4* 8.1* 8.3* 8.5* 8.8*  PHOS  --   --   --   --   --  5.2* 5.0*   Liver Function Tests: Recent Labs  Lab 11/02/20 0643 11/03/20 0514  ALBUMIN 2.6* 2.7*   No results for input(s): LIPASE, AMYLASE in the last 168 hours. No results for input(s): AMMONIA in the last 168 hours. CBC: Recent Labs  Lab 10/29/20 0606 10/30/20 0534 10/31/20 0419 11/01/20 0442 11/02/20 0643  WBC 7.1 6.7 7.0 5.9 5.5  HGB 8.3* 8.6* 8.4* 8.4* 8.4*  HCT 26.1* 27.0* 26.7* 26.6* 25.6*  MCV 86.4 86.3 85.6 85.0 83.1  PLT 174 205 194 215 206   Cardiac Enzymes: No results for input(s): CKTOTAL, CKMB, CKMBINDEX, TROPONINI in the last 168 hours. CBG: Recent Labs  Lab 11/02/20 0731 11/02/20 1131 11/02/20 1629 11/02/20 1940 11/03/20 0714   GLUCAP 83 147* 173* 140* 82    Iron Studies: No results for input(s): IRON, TIBC, TRANSFERRIN, FERRITIN in the last 72 hours. Studies/Results: No results found. Marland Kitchen aspirin  325 mg Oral Daily  . cholecalciferol  1,000 Units Oral Daily  . collagenase   Topical Daily  . doxycycline  100 mg Oral Q12H  . epoetin alfa-epbx  8,000 Units Subcutaneous Q14 Days  . feeding supplement (GLUCERNA SHAKE)  237 mL Oral TID BM  . heparin  5,000 Units Subcutaneous Q8H  . hydrALAZINE  100 mg Oral TID  . hydrocerin   Topical Daily  . insulin aspart  0-15 Units Subcutaneous TID WC  . insulin aspart  0-5 Units Subcutaneous QHS  . insulin detemir  20 Units Subcutaneous QHS  . metoprolol succinate  25 mg Oral QPM  . nystatin   Topical TID  . polyethylene glycol  17 g Oral Daily  . pravastatin  80 mg Oral QPM  . senna  2 tablet Oral Daily  . vitamin C  1,000 mg Oral Daily    BMET    Component Value Date/Time   NA 135 11/03/2020 0514  K 4.1 11/03/2020 0514   CL 102 11/03/2020 0514   CO2 23 11/03/2020 0514   GLUCOSE 88 11/03/2020 0514   BUN 80 (H) 11/03/2020 0514   CREATININE 5.20 (H) 11/03/2020 0514   CALCIUM 8.8 (L) 11/03/2020 0514   CALCIUM 8.8 03/13/2019 0700   GFRNONAA 11 (L) 11/03/2020 0514   GFRAA 17 (L) 03/30/2020 0620   CBC    Component Value Date/Time   WBC 5.5 11/02/2020 0643   RBC 3.08 (L) 11/02/2020 0643   HGB 8.4 (L) 11/02/2020 0643   HCT 25.6 (L) 11/02/2020 0643   PLT 206 11/02/2020 0643   MCV 83.1 11/02/2020 0643   MCH 27.3 11/02/2020 0643   MCHC 32.8 11/02/2020 0643   RDW 16.9 (H) 11/02/2020 0643   LYMPHSABS 0.6 (L) 10/25/2020 1327   MONOABS 0.5 10/25/2020 1327   EOSABS 0.3 10/25/2020 1327   BASOSABS 0.0 10/25/2020 1327    Assessment/Plan:  1. AKI/CKD stage IV- pt with significant proteinuria and progressive CKD thought to be due to DM. Followed by Dr. Theador Hawthorne at [facility] and had been referred to [facility] in March, however he did not keep appointment. Baseline Scr  2.7-3.96. BUN/Cr have been climbing with diuresis and likely component of cardiorenal syndrome. Discussed possible need to initiate HD during this hospitalization.  1. Will continue with IV lasix and follow UOP and BUN/Cr which are stable 2. Currently without uremic symptoms but if BUN/Cr continue to climb will likely need to start HD during this hospitalization.  3. Continue to follow for signs of uremia 4. Will need AVF/AVG placed as soon as possible given progressive CKD 2. Bilateral lower extremity cellulitis with open wound on ankle and chronic venous stasis dermatitis. Wound care following. Biopsy taken and started on IV abx. 3. New onset atrial fibrillation - on metoprolol and apixaban per primary svc 4. DM - per primary 5. MOrbid obesity 6. Moderate protein malnutrition - per primary. 7. Anemia of CKD - transfuse prn and will likely require ESA.   Danny Potts, MD [employer] [phone]

## 2020-11-03 NOTE — Progress Notes (Signed)
PT Cancellation Note  Patient Details Name: Danny [name] MRN: [id number] DOB: 1956/02/06   Cancelled Treatment:    Reason Eval/Treat Not Completed: Other (comment) (PT declined due to nausea)   Rayetta Humphrey, PT CLT [phone] 11/03/2020, 3:32 PM

## 2020-11-03 NOTE — Progress Notes (Signed)
[facility]   Patient with chronic wound on right heel, varus abnormality of the heel, osteoarthritis. Repeat biopsied not back.   Discussed with podiatry and recommended Dekarlos Dial DPM at [facility] for further evaluation.  Will call patient with results of pathology if he is discharged. Continue wound care. Patient reports he thinks he is going to SNF.   Curlene Labrum, MD [facility] [address] [place], [place] [phone] (office)

## 2020-11-04 DIAGNOSIS — E1122 Type 2 diabetes mellitus with diabetic chronic kidney disease: Secondary | ICD-10-CM | POA: Diagnosis not present

## 2020-11-04 DIAGNOSIS — E440 Moderate protein-calorie malnutrition: Secondary | ICD-10-CM | POA: Diagnosis not present

## 2020-11-04 DIAGNOSIS — N184 Chronic kidney disease, stage 4 (severe): Secondary | ICD-10-CM | POA: Diagnosis not present

## 2020-11-04 DIAGNOSIS — N179 Acute kidney failure, unspecified: Secondary | ICD-10-CM | POA: Diagnosis not present

## 2020-11-04 LAB — RENAL FUNCTION PANEL
Albumin: 2.7 g/dL — ABNORMAL LOW (ref 3.5–5.0)
Anion gap: 10 (ref 5–15)
BUN: 85 mg/dL — ABNORMAL HIGH (ref 8–23)
CO2: 23 mmol/L (ref 22–32)
Calcium: 8.6 mg/dL — ABNORMAL LOW (ref 8.9–10.3)
Chloride: 100 mmol/L (ref 98–111)
Creatinine, Ser: 5.5 mg/dL — ABNORMAL HIGH (ref 0.61–1.24)
GFR, Estimated: 11 mL/min — ABNORMAL LOW (ref >60)
Glucose, Bld: 114 mg/dL — ABNORMAL HIGH (ref 70–99)
Phosphorus: 5.1 mg/dL — ABNORMAL HIGH (ref 2.5–4.6)
Potassium: 3.8 mmol/L (ref 3.5–5.1)
Sodium: 133 mmol/L — ABNORMAL LOW (ref 135–145)

## 2020-11-04 LAB — VITAMIN B12: Vitamin B-12: 1355 pg/mL — ABNORMAL HIGH (ref 180–914)

## 2020-11-04 LAB — GLUCOSE, CAPILLARY
Glucose-Capillary: 110 mg/dL — ABNORMAL HIGH (ref 70–99)
Glucose-Capillary: 175 mg/dL — ABNORMAL HIGH (ref 70–99)
Glucose-Capillary: 141 mg/dL — ABNORMAL HIGH (ref 70–99)
Glucose-Capillary: 145 mg/dL — ABNORMAL HIGH (ref 70–99)
Glucose-Capillary: 138 mg/dL — ABNORMAL HIGH (ref 70–99)

## 2020-11-04 MED ORDER — ALBUMIN HUMAN 25 % IV SOLN
25.0000 g | Freq: Four times a day (QID) | INTRAVENOUS | Status: AC
Start: 2020-11-04 — End: 2020-11-04
  Administered 2020-11-04 (×2): 25 g via INTRAVENOUS
  Filled 2020-11-04 (×2): qty 100

## 2020-11-04 MED ORDER — CYANOCOBALAMIN 1000 MCG/ML IJ SOLN
1000.0000 ug | Freq: Once | INTRAMUSCULAR | Status: AC
  Administered 2020-11-04: 1000 ug via INTRAMUSCULAR
  Filled 2020-11-04: qty 1

## 2020-11-04 MED ORDER — TORSEMIDE 20 MG PO TABS
50.0000 mg | ORAL_TABLET | Freq: Two times a day (BID) | ORAL | Status: DC
  Administered 2020-11-04 – 2020-11-05 (×4): 50 mg via ORAL
  Filled 2020-11-04 (×4): qty 3

## 2020-11-04 NOTE — Progress Notes (Addendum)
[facility] NEPHROLOGY PROGRESS NOTE  Assessment/ Plan: Pt is a 65 y.o. yo male  With HTN, DM2, chronic venous stasis dermatitis/lymphedema, anemia, CKD stage IV admitted with lower extremity cellulitis failed outpatient antibiotics, seen as a consultation for AKI on CKD.  #Acute kidney injury on CKD stage IV with significant proteinuria and progressive CKD thought to be due to diabetic nephropathy and CHF/fluid overload.  Followed by Dr. Theador Hawthorne at [facility] and had been referred to vascular surgeon for permanent access, however he did not keep appointment.  Baseline creatinine seems to be around 2.7-3.96.  He received IV Lasix with decent urine output (total negative by 8.2 L) .  BUN and a creatinine level trending up.  He looks comfortable without any uremic symptoms.  The lower extremity edema looks like chronic and mainly contributed by venous stasis/lymphedema.  I discussed about dialysis and placement of permanent access.  He would like to discuss this with his primary nephrologist after discharge from the hospital. He doesn't want dialysis now. Fortunately, there is no absolute indication for urgent dialysis.  I will discontinue IV Lasix and switch to oral torsemide 50 mg twice daily.  Ordered 2 doses of IV albumin to increase intravascular volume.  Repeat renal panel in the morning.    #Bilateral lower extremity cellulitis, chronic venous stasis dermatitis and lymphedema: Seen by wound care, general surgery and had biopsy done.  Currently on IV antibiotics.  #New onset A. fib: On metoprolol and Eliquis.  # Anemia of CKD and chronic inflammatory state: Received Epogen.  No iron because of infection.  # HTN/volume: Blood pressure acceptable, volume management as above.  #Hyponatremia, hypervolemic: On loop diuretics as above.  #CKD-MBD: On oral vitamin D.  Monitor calcium phosphorus level.  #Morbid obesity  Discussed with RN and the primary team.  Subjective: Seen and  examined at bedside.  Urine output recorded 1 L.  Weight is not changed significantly therefore I doubt on accuracy especially he is negative by 8.2 L since admission.  Denies nausea, vomiting, dysgeusia, chest pain, shortness of breath.  Reports chronic fatigue.   Objective Vital signs in last 24 hours: Vitals:   11/03/20 1303 11/03/20 2151 11/04/20 0419 11/04/20 0500  BP: (!) 152/86 (!) 115/58 126/81   Pulse: 93 70 (!) 55   Resp: 18 20 19    Temp: 98.7 F (37.1 C) 97.8 F (36.6 C) 97.6 F (36.4 C)   TempSrc:  Oral    SpO2: 99% 98% 98%   Weight:    (!) 136.7 kg  Height:       Weight change: 0 kg  Intake/Output Summary (Last 24 hours) at 11/04/2020 0916 Last data filed at 11/03/2020 1645 Gross per 24 hour  Intake 360 ml  Output 1050 ml  Net -690 ml       Labs: Basic Metabolic Panel: Recent Labs  Lab 11/02/20 0643 11/03/20 0514 11/04/20 0522  NA 135 135 133*  K 4.7 4.1 3.8  CL 103 102 100  CO2 23 23 23   GLUCOSE 81 88 114*  BUN 74* 80* 85*  CREATININE 5.15* 5.20* 5.50*  CALCIUM 8.5* 8.8* 8.6*  PHOS 5.2* 5.0* 5.1*   Liver Function Tests: Recent Labs  Lab 11/02/20 0643 11/03/20 0514 11/04/20 0522  ALBUMIN 2.6* 2.7* 2.7*   No results for input(s): LIPASE, AMYLASE in the last 168 hours. No results for input(s): AMMONIA in the last 168 hours. CBC: Recent Labs  Lab 10/29/20 0606 10/30/20 0534 10/31/20 0419 11/01/20 0442 11/02/20  0643  WBC 7.1 6.7 7.0 5.9 5.5  HGB 8.3* 8.6* 8.4* 8.4* 8.4*  HCT 26.1* 27.0* 26.7* 26.6* 25.6*  MCV 86.4 86.3 85.6 85.0 83.1  PLT 174 205 194 215 206   Cardiac Enzymes: No results for input(s): CKTOTAL, CKMB, CKMBINDEX, TROPONINI in the last 168 hours. CBG: Recent Labs  Lab 11/03/20 0714 11/03/20 1107 11/03/20 1606 11/03/20 2153 11/04/20 0714  GLUCAP 82 134* 172* 141* 110*    Iron Studies: No results for input(s): IRON, TIBC, TRANSFERRIN, FERRITIN in the last 72 hours. Studies/Results: No results  found.  Medications: Infusions: . albumin human    .  ceFAZolin (ANCEF) IV 1 g (11/03/20 2153)    Scheduled Medications: . aspirin  325 mg Oral Daily  . cholecalciferol  1,000 Units Oral Daily  . collagenase   Topical Daily  . doxycycline  100 mg Oral Q12H  . epoetin alfa-epbx  8,000 Units Subcutaneous Q14 Days  . feeding supplement (NEPRO CARB STEADY)  237 mL Oral BID BM  . heparin  5,000 Units Subcutaneous Q8H  . hydrALAZINE  100 mg Oral TID  . hydrocerin   Topical Daily  . insulin aspart  0-15 Units Subcutaneous TID WC  . insulin aspart  0-5 Units Subcutaneous QHS  . insulin detemir  20 Units Subcutaneous QHS  . metoprolol succinate  25 mg Oral QPM  . nystatin   Topical TID  . polyethylene glycol  17 g Oral Daily  . pravastatin  80 mg Oral QPM  . senna  2 tablet Oral Daily  . torsemide  50 mg Oral BID  . vitamin C  1,000 mg Oral Daily    have reviewed scheduled and prn medications.  Physical Exam: General:NAD, comfortable Heart:RRR, s1s2 nl Lungs:clear b/l, no crackle Abdomen:soft, Non-tender, non-distended Extremities: Bilateral lower extremities has chronic dermatitis, dressing applied and some edema. Dialysis Access: None  Danny [name] 11/04/2020,9:16 AM  LOS: 7 days

## 2020-11-04 NOTE — Progress Notes (Signed)
PROGRESS NOTE  Danny [name] KJZ:[id number] DOB: [birth year] DOA: 10/25/2020 PCP: Celene Squibb, MD   Brief History: 65 y/owith a history of hypertension, diabetes mellitus type 2, CKD stage III, psoriasis, venous stasis dermatitis, lymphedema presenting with 2-weekhistory of his legs edema, pain, erythema and becoming weak. He reports he was placed on a 2 week course of doxy in the beginning of April with some improvement, but his legs have worsened since finishing doxy 1-2 weeks prior to this admission. He states he is WC bound and has not ambulated since July 2021. He reports his legs have been draining yellow clear and cloudy fluid.  In the ED Temp 97.5, heart rate 81, respiratory rate 16-18, blood pressure 132/76, satting at 97% No leukocytosis with white blood cell count of 7.4, hemoglobin 8.9 Chemistry panel reveals an elevated BUN and creatinine that are close to his baseline at 47 and 3.79 Glucose is 151 UA is not indicative of UTI CRP is 3.4, sed rate is 69 COVIDneg Blood cultures neg EKG shows a heart rate of 97, QTc 477, PVCs Chest x-ray is negative Alk phos is elevated 131 Admission was requested to antibiotics and wound care to patient. Does not have reliable meansto follow-up outpatient -patient was started on abx with some clinical improvement.  He was started on IV lasix for his anasarca.  Nephrology was consulted.  His serum creatinine is slowly rising after starting on IV lasix.  Assessment/Plan: Bilateral lower extremity cellulitis-ankle open wound, chronic venous stasisdermatitis 1. Likely a component of cellulitis and a component of venous stasis contributing to the open wounds on patient's leg; Failed outpatient antibiotics 2. Wound care consulted, also general surgery consulted for evaluation possible debridement. Appreciate Dr. Pamelia Hoit taken. Patient not interested in surgery or amputation at this time.  3. Patient was initiated on  vancomycin, due to acute on chronic kidney disease, switch to linezolidinitially. 4. Came to the ER earlier in April and was started on doxycycline, reports taking all of it but then having no way to follow-up with any provider secondary to transportation barriers, now legs worsening 5. Vancomycin started in the ED.Marland KitchenMarland KitchenDiscontinued 10/26/2020 6. Blood cultures>>>>neg to date 7. MRI ankle--no abscess, no osteomyelitis. Fatty atrophy of visualized musculature = chronic devervation 8. CRP 3.4, ESR 69 9. D/c zyvox. Start cefazolin anddoxy 5/4 10. Appreciate general surgery consult-->bx and culture done 5/3 11. SW/TOC assisting with setting up transport with [facility] appointment with outpatient wound care clinic 12. Right heel repeat biopsy by Dr. Constance Haw b/c cannot r/o SCC on prior bx on 10/26/20; repeat biopsy pending. Might need outpatient follow up with terciary facility podiatrist for further eval/management.   New Onset Atrial fibrillation -personally reviewed EKG--Afib, nonspecific T wave change -rate controlled -CHADS-VASc = 5 (CHF, HTN, 65, DM, ASVD) -continue metoprolol succinate -started on apixaban -5/7EchoEF 55-60%; mild decrease RV fxn, RVSP 61.5 -TSH 2.028  Acute on chronicCKD 4 -Trend BUN/creatinine--uptrending -still appear volume overloaded -baseline creatinine 3.7-4.0 -serum creatinine up to 4.78>>5.19>>5.15>>5.20>>5.50 -renal consult appreciated-discussed with Dr. Carolin Sicks -Following recommendations by nephrology service IV Lasix has been discontinued, albumin infusion x2 will be given and patient started on Demadex. -Patient expressed interest in dialysis initiation during hospitalization at this time.  Fall and Generalized weakness 1. Mechanical fall no LOC, did not hit head 2. Consult PT-->SNF  DMII-uncontrolledwith hyperglycemia 1. CBG QA CHS, SSI coverage, 2. Takes 70/30 20-100 unitsat home 3. Continue Levemir and  SSI 4. A1c:7.6 5.  Carb modified diet  Moderate protein cal malnutrition 1. Albumin 2.8 2. Counseled on the importance of nutrient dense diet; and appropriate intake of protein.  Will add Nepro BID. 3. Continue to monitor  Medical non compliance 1. TOC consulted for possible PT at home?Any assistance with transportation for medical appointments to be explored at time of discharge.  Class 2 Obesity -Body mass index is 39.76 kg/m. -Low calorie diet, portion control and increase physical activity has been discussed with patient.  Atypical Chest pain -had CP 10/30/20 -No further chest pain reported -EKG without acute ischemic changes -No abnormalities appreciated on telemetry -Troponin has been negative.    Status FE:[id number]   Dispo: The patient is from:Home Anticipated d/c is IT:GPQD Patient currently is not medically stable to d/c. Difficult to place patient no    Family Communication:noFamily at bedside  Consultants:none  Code Status: FULL  DVT Prophylaxis: [place] Heparin    Procedures: As Listed in Progress Note Above  Antibiotics: zyvox 5/3>>5/4 Cefazolin 5/4>>5/12 Doxy 5/4>>5/12    Subjective: No chest pain, no nausea, no vomiting.  Continues to have good urine output respiratory is feeling tired, fatigued and just not feeling right.  After long discussion with me and thinking about how recommendations by nephrology service is interested in initiating dialysis.   Objective: Vitals:   11/03/20 2151 11/04/20 0419 11/04/20 0500 11/04/20 1356  BP: (!) 115/58 126/81  130/67  Pulse: 70 (!) 55  (!) 50  Resp: 20 19  18   Temp: 97.8 F (36.6 C) 97.6 F (36.4 C)  97.8 F (36.6 C)  TempSrc: Oral   Oral  SpO2: 98% 98%  98%  Weight:   (!) 136.7 kg   Height:        Intake/Output Summary (Last 24 hours) at 11/04/2020 1453 Last data filed at 11/03/2020 1645 Gross per 24 hour  Intake --  Output  625 ml  Net -625 ml   Weight change: 0 kg  Exam: General exam: Alert, awake, oriented x 3; reports continued to have good urine output; no nausea, no vomiting, no fever, no chest pain or significant shortness of breath at this time.  Still with signs of fluid overload on examination.  Patient expressed feeling weak, easily tired/fatigue and just not feeling right. Respiratory system: No using accessory muscle, positive rhonchi bilaterally, no requiring oxygen supplementation.  Good saturation on room air Cardiovascular system:RRR. No rubs or gallops; unable to properly assess JVD with body habitus. Gastrointestinal system: Abdomen is obese, nondistended, soft and nontender. No organomegaly or masses felt. Normal bowel sounds heard. Central nervous system: Alert and oriented. No focal neurological deficits. Extremities: No cyanosis or clubbing; 2+ edema appreciated bilaterally.  Positive wrapping therapy in place.  Clean dressings. Skin: No petechiae. Psychiatry: Judgement and insight appear normal. Mood & affect appropriate.    Data Reviewed: I have personally reviewed following labs and imaging studies  Basic Metabolic Panel: Recent Labs  Lab 10/31/20 0419 11/01/20 0442 11/02/20 0643 11/03/20 0514 11/04/20 0522  NA 133* 134* 135 135 133*  K 5.0 4.6 4.7 4.1 3.8  CL 103 104 103 102 100  CO2 21* 21* 23 23 23   GLUCOSE 108* 91 81 88 114*  BUN 64* 71* 74* 80* 85*  CREATININE 4.78* 5.19* 5.15* 5.20* 5.50*  CALCIUM 8.1* 8.3* 8.5* 8.8* 8.6*  MG  --  2.2  --   --   --   PHOS  --   --  5.2* 5.0* 5.1*   Liver Function  Tests: Recent Labs  Lab 11/02/20 0643 11/03/20 0514 11/04/20 0522  ALBUMIN 2.6* 2.7* 2.7*   CBC: Recent Labs  Lab 10/29/20 0606 10/30/20 0534 10/31/20 0419 11/01/20 0442 11/02/20 0643  WBC 7.1 6.7 7.0 5.9 5.5  HGB 8.3* 8.6* 8.4* 8.4* 8.4*  HCT 26.1* 27.0* 26.7* 26.6* 25.6*  MCV 86.4 86.3 85.6 85.0 83.1  PLT 174 205 194 215 206   CBG: Recent Labs  Lab  11/03/20 1107 11/03/20 1606 11/03/20 2153 11/04/20 0714 11/04/20 1104  GLUCAP 134* 172* 141* 110* 175*   Urine analysis:    Component Value Date/Time   COLORURINE YELLOW 10/25/2020 [place] 10/25/2020 1516   APPEARANCEUR Clear 09/10/2020 1122   LABSPEC 1.010 10/25/2020 1516   PHURINE 6.0 10/25/2020 1516   GLUCOSEU 150 (A) 10/25/2020 1516   HGBUR SMALL (A) 10/25/2020 1516   BILIRUBINUR NEGATIVE 10/25/2020 1516   BILIRUBINUR Negative 09/10/2020 1122   KETONESUR NEGATIVE 10/25/2020 1516   PROTEINUR >=300 (A) 10/25/2020 1516   NITRITE NEGATIVE 10/25/2020 1516   LEUKOCYTESUR NEGATIVE 10/25/2020 1516   Sepsis Labs:  Recent Results (from the past 240 hour(s))  Aerobic Culture w Gram Stain (superficial specimen)     Status: Abnormal   Collection Time: 10/25/20  3:29 PM   Specimen: Skin, Cyst/Tag/Debridement; Wound  Result Value Ref Range Status   Specimen Description   Final    SKIN Performed at [facility] [address], [place], [place] [address]    Special Requests   Final    NONE Performed at [facility], [address], [place], [place] [address]    Gram Stain   Final    RARE WBC PRESENT, PREDOMINANTLY PMN RARE GRAM POSITIVE COCCI IN PAIRS RARE GRAM NEGATIVE RODS Performed at [facility], [place] [address], [place], [place] [address]    Culture MULTIPLE ORGANISMS PRESENT, NONE PREDOMINANT (A)  Final   Report Status 10/27/2020 FINAL  Final  Culture, blood (routine x 2)     Status: None   Collection Time: 10/25/20  3:59 PM   Specimen: BLOOD  Result Value Ref Range Status   Specimen Description BLOOD LEFT ARM  Final   Special Requests   Final    BOTTLES DRAWN AEROBIC AND ANAEROBIC Blood Culture adequate volume   Culture   Final    NO GROWTH 5 DAYS Performed at [facility], [address] [place] [address] [place], [place] [address]    Report Status 10/30/2020 FINAL  Final  Culture, blood (routine x 2)     Status: None   Collection Time: 10/25/20   3:59 PM   Specimen: BLOOD  Result Value Ref Range Status   Specimen Description BLOOD RIGHT ARM  Final   Special Requests   Final    BOTTLES DRAWN AEROBIC AND ANAEROBIC Blood Culture results may not be optimal due to an excessive volume of blood received in culture bottles   Culture   Final    NO GROWTH 5 DAYS Performed at [facility], [address] [place], [place] [address]    Report Status 10/30/2020 FINAL  Final  SARS CORONAVIRUS 2 (TAT 6-24 HRS) Nasopharyngeal Nasopharyngeal Swab     Status: None   Collection Time: 10/25/20  4:30 PM   Specimen: Nasopharyngeal Swab  Result Value Ref Range Status   SARS Coronavirus 2 NEGATIVE NEGATIVE Final    Comment: (NOTE) SARS-CoV-2 target nucleic acids are NOT DETECTED.  The SARS-CoV-2 RNA is generally detectable in upper and lower respiratory  specimens during the acute phase of infection. Negative results do not preclude SARS-CoV-2 infection, do not rule out co-infections with other pathogens, and should not be used as the sole basis for treatment or other patient management decisions. Negative results must be combined with clinical observations, patient history, and epidemiological information. The expected result is Negative.  Fact Sheet for Patients: SugarRoll.be  Fact Sheet for Healthcare Providers: https://www.woods-mathews.com/  This test is not yet approved or cleared by the [place] FDA and  has been authorized for detection and/or diagnosis of SARS-CoV-2 by FDA under an Emergency Use Authorization (EUA). This EUA will remain  in effect (meaning this test can be used) for the duration of the COVID-19 declaration under Se ction 564(b)(1) of the Act, 21 U.S.C. section 360bbb-3(b)(1), unless the authorization is terminated or revoked sooner.  Performed at [facility], [place] [address], [place], [place] [address]   Aerobic Culture w Gram Stain (superficial specimen)      Status: Abnormal   Collection Time: 10/25/20  5:10 PM   Specimen: Ankle; Wound  Result Value Ref Range Status   Specimen Description   Final    ANKLE Performed at [facility], [address], [place], [place] [address]    Special Requests   Final    NONE Performed at [facility] [place], [place] [address]    Gram Stain   Final    RARE WBC PRESENT, PREDOMINANTLY PMN MODERATE GRAM POSITIVE COCCI IN PAIRS IN CLUSTERS MODERATE GRAM NEGATIVE RODS RARE GRAM POSITIVE RODS Performed at [facility], [place] [address], [place], [place] [address]    Culture MULTIPLE ORGANISMS PRESENT, NONE PREDOMINANT (A)  Final   Report Status 10/28/2020 FINAL  Final  Fungus Culture With Stain     Status: None (Preliminary result)   Collection Time: 10/26/20  3:59 PM   Specimen: Ankle  Result Value Ref Range Status   Fungus Stain Final report  Final    Comment: (NOTE) Performed At: [facility] [address] [place], [place] [id number] Rush Farmer MD OZ:[id number]    Fungus (Mycology) Culture PENDING  Incomplete   Fungal Source ANKLE  Final    Comment: WOUND Performed at [facility], [address] [place], [place] [address]   Aerobic/Anaerobic Culture w Gram Stain (surgical/deep wound)     Status: Abnormal   Collection Time: 10/26/20  3:59 PM   Specimen: Tissue  Result Value Ref Range Status   Specimen Description TISSUE RIGHT ANKLE  Final   Special Requests NONE  Final   Gram Stain   Final    MODERATE WBC PRESENT,BOTH PMN AND MONONUCLEAR FEW SQUAMOUS EPITHELIAL CELLS PRESENT ABUNDANT GRAM NEGATIVE RODS MODERATE GRAM POSITIVE COCCI IN PAIRS MODERATE GRAM POSITIVE RODS    Culture (A)  Final    MULTIPLE ORGANISMS PRESENT, NONE PREDOMINANT NO GROUP A STREP (S.PYOGENES) ISOLATED NO STAPHYLOCOCCUS AUREUS ISOLATED NO ANAEROBES ISOLATED Performed at [facility], [place] [address], [place], [place] [address]    Report Status 11/01/2020 FINAL  Final  Fungus  Culture Result     Status: None   Collection Time: 10/26/20  3:59 PM  Result Value Ref Range Status   Result 1 Comment  Final    Comment: (NOTE) KOH/Calcofluor preparation:  no fungus observed. Performed At: [facility] [place], [place] [id number] Rush Farmer MD CZ:[id number]      Scheduled Meds: . aspirin  325 mg Oral Daily  . cholecalciferol  1,000 Units Oral Daily  . collagenase   Topical Daily  . doxycycline  100 mg Oral Q12H  . epoetin alfa-epbx  8,000 Units Subcutaneous Q14 Days  . feeding supplement (NEPRO CARB STEADY)  237 mL Oral BID BM  . heparin  5,000 Units Subcutaneous Q8H  . hydrALAZINE  100 mg Oral TID  . hydrocerin   Topical Daily  . insulin aspart  0-15 Units Subcutaneous TID WC  . insulin aspart  0-5 Units Subcutaneous QHS  . insulin detemir  20 Units Subcutaneous QHS  . metoprolol succinate  25 mg Oral QPM  . nystatin   Topical TID  . polyethylene glycol  17 g Oral Daily  . pravastatin  80 mg Oral QPM  . senna  2 tablet Oral Daily  . torsemide  50 mg Oral BID  . vitamin C  1,000 mg Oral Daily   Continuous Infusions: . albumin human 25 g (11/04/20 1040)  .  ceFAZolin (ANCEF) IV 1 g (11/04/20 0934)    Procedures/Studies: DG Chest 2 View  Result Date: 10/25/2020 CLINICAL DATA:  CHF with leg swelling and ulcer to right lateral malleolus. EXAM: CHEST - 2 VIEW COMPARISON:  09/26/20 FINDINGS: There is mild cardiac enlargement, stable from previous exam. No pleural effusion or interstitial edema identified. No airspace densities. IMPRESSION: No acute findings.  No evidence for congestive heart failure. Electronically Signed   By: Kerby Moors M.D.   On: 10/25/2020 14:21   DG Ankle Complete Right  Result Date: 10/25/2020 CLINICAL DATA:  CHF, lower extremity swelling, lateral malleolar ulcer with concern for infection, no reported injury EXAM: RIGHT ANKLE - COMPLETE 3+ VIEW COMPARISON:  09/26/2020 right ankle radiographs FINDINGS: Diffuse  right ankle soft tissue swelling. Small soft tissue defect at the lateral malleolus compatible with reported ulcer. Subtle loss of cortical line at the lateral distal margin of the lateral malleolus suspicious for early bony erosion. No fracture. No subluxation. Severe tibiotalar osteoarthritis. Intact staples at the lateral aspect of the calcaneus. Partially visualized intact screw at the base of fifth metatarsal. No focal osseous lesions. IMPRESSION: Diffuse right ankle soft tissue swelling. Small soft tissue defect at the lateral malleolus compatible with reported ulcer. Subtle loss of cortical line at the lateral distal margin of the lateral malleolus, suspicious for early bony erosion due to osteomyelitis. Consider MRI of the right ankle without IV contrast for further evaluation as clinically warranted. Electronically Signed   By: Ilona Sorrel M.D.   On: 10/25/2020 14:24   MR ANKLE RIGHT WO CONTRAST  Result Date: 10/26/2020 CLINICAL DATA:  Lateral right ankle ulceration EXAM: MRI OF THE RIGHT ANKLE WITHOUT CONTRAST TECHNIQUE: Multiplanar, multisequence MR imaging of the ankle was performed. No intravenous contrast was administered. COMPARISON:  X-ray 10/25/2020 FINDINGS: TENDONS Peroneal: Tendinosis with longitudinal split tear of the infra-malleolar aspect of the peroneus brevis tendon. Intact peroneus longus tendon. No tenosynovitis. Posteromedial: Intact tibialis posterior, flexor hallucis longus and flexor digitorum longus tendons. Anterior: Intact tibialis anterior, extensor hallucis longus and extensor digitorum longus tendons. Achilles: Intact. Plantar Fascia: Intact. LIGAMENTS Lateral: Anterior talofibular ligament and calcaneofibular ligaments are not clearly seen, and likely chronically torn. Posterior talofibular ligament is heterogeneous but intact. Intact anterior and posterior tibiofibular ligaments. Medial: Deltoid ligament appears chronically torn. Portions of the spring ligament complex are  ill-defined. CARTILAGE Ankle Joint: Severe tibiotalar osteoarthritis with full-thickness cartilage loss most pronounced anteriorly. No joint effusion. Prominent osseous densities along the posterior aspect of the tibiotalar and subtalar joints, largest  measuring up to 2.4 cm. Subtalar Joints/Sinus Tarsi: Moderate arthropathy of the posterior subtalar joint. No effusion. Preservation of the anatomic fat within the sinus tarsi. Bones: No acute fracture or dislocation. No bony erosion or cortical destruction. No marrow edema. Well healed calcaneal osteotomy. Metallic susceptibility artifact from hardware within the fifth metatarsal. Other: Soft tissue swelling with superficial ulceration overlying the lateral malleolus. No organized fluid collection. Diffuse edema-like signal of the visualized foot and lower leg musculature. There is also fatty atrophy of the visualized musculature compatible with chronic denervation. IMPRESSION: 1. Soft tissue swelling with superficial ulceration overlying the lateral malleolus. No organized fluid collection. No evidence of osteomyelitis. 2. Severe tibiotalar osteoarthritis with prominent osseous densities along the posterior aspect of the tibiotalar and subtalar joints, largest measuring up to 2.4 cm. 3. Fatty atrophy and edema throughout the visualized foot and lower leg musculature, likely representing a combination of denervation changes and/or myositis. Findings are in agreement with the preliminary report provided by Dr. Francoise Ceo. Preliminary report was called to the ordering provider Eustaquio Maize, PA at 5:54 p.m. on 10/25/2020 by Dr. Francoise Ceo. Electronically Signed   By: Davina Poke D.O.   On: 10/26/2020 08:32   ECHOCARDIOGRAM COMPLETE  Result Date: 10/30/2020    ECHOCARDIOGRAM REPORT   Patient Name:   JOVEN MOM Date of Exam: 10/30/2020 Medical Rec #:  [id number]   Height:       73.0 in Accession #:    [id number]  Weight:       279.8 lb Date of Birth:  Feb 09, 1956    BSA:           2.482 m Patient Age:    65 years    BP:           115/71 mmHg Patient Gender: M           HR:           81 bpm. Exam Location:  [name] Procedure: 2D Echo, Cardiac Doppler and Color Doppler Indications:    I48.0 Paroxysmal atrial fibrillation  History:        Patient has prior history of Echocardiogram examinations, most                 recent 02/24/2019. Risk Factors:Hypertension and Diabetes. CKD.  Sonographer:    Jonelle Sidle Dance Referring Phys: [phone] DAVID TAT  Sonographer Comments: Technically difficult study due to poor echo windows. IMPRESSIONS  1. Left ventricular ejection fraction, by estimation, is 55 to 60%. The left ventricle has normal function. Left ventricular endocardial border not optimally defined to evaluate regional wall motion. There is moderate left ventricular hypertrophy. Left ventricular diastolic parameters are indeterminate.  2. Right ventricular systolic function is mildly reduced. The right ventricular size is moderately enlarged. There is severely elevated pulmonary artery systolic pressure. The estimated right ventricular systolic pressure is 29.1 mmHg.  3. Left atrial size was mildly dilated.  4. Right atrial size was moderately dilated.  5. The mitral valve is normal in structure. Mild mitral valve regurgitation. No evidence of mitral stenosis.  6. The aortic valve is grossly normal. Aortic valve regurgitation is not visualized. No aortic stenosis is present.  7. The inferior vena cava is dilated in size with <50% respiratory variability, suggesting right atrial pressure of 15 mmHg. FINDINGS  Left Ventricle: Left ventricular ejection fraction, by estimation, is 55 to 60%. The left ventricle has normal function. Left ventricular endocardial border not optimally defined to evaluate regional wall motion. The left ventricular  internal cavity size was normal in size. There is moderate left ventricular hypertrophy. Left ventricular diastolic parameters are indeterminate. Right  Ventricle: The right ventricular size is moderately enlarged. No increase in right ventricular wall thickness. Right ventricular systolic function is mildly reduced. There is severely elevated pulmonary artery systolic pressure. The tricuspid regurgitant velocity is 3.41 m/s, and with an assumed right atrial pressure of 15 mmHg, the estimated right ventricular systolic pressure is 50.5 mmHg. Left Atrium: Left atrial size was mildly dilated. Right Atrium: Right atrial size was moderately dilated. Pericardium: There is no evidence of pericardial effusion. Mitral Valve: The mitral valve is normal in structure. Mild mitral valve regurgitation. No evidence of mitral valve stenosis. Tricuspid Valve: The tricuspid valve is normal in structure. Tricuspid valve regurgitation is not demonstrated. No evidence of tricuspid stenosis. Aortic Valve: The aortic valve is grossly normal. Aortic valve regurgitation is not visualized. No aortic stenosis is present. Pulmonic Valve: The pulmonic valve was normal in structure. Pulmonic valve regurgitation is not visualized. No evidence of pulmonic stenosis. Aorta: The aortic root is normal in size and structure. Venous: The inferior vena cava is dilated in size with less than 50% respiratory variability, suggesting right atrial pressure of 15 mmHg. IAS/Shunts: The interatrial septum was not well visualized.  LEFT VENTRICLE PLAX 2D LVIDd:         4.67 cm  Diastology LVIDs:         3.08 cm  LV e' medial:    6.57 cm/s LV PW:         1.67 cm  LV E/e' medial:  21.6 LV IVS:        1.37 cm  LV e' lateral:   7.36 cm/s LVOT diam:     2.00 cm  LV E/e' lateral: 19.3 LV SV:         54 LV SV Index:   22 LVOT Area:     3.14 cm  RIGHT VENTRICLE RV Basal diam:  3.79 cm RV Mid diam:    3.04 cm RV S prime:     11.10 cm/s TAPSE (M-mode): 1.6 cm LEFT ATRIUM             Index       RIGHT ATRIUM           Index LA diam:        5.00 cm 2.01 cm/m  RA Area:     29.80 cm LA Vol (A2C):   98.6 ml 39.73 ml/m RA  Volume:   108.00 ml 43.52 ml/m LA Vol (A4C):   96.5 ml 38.89 ml/m LA Biplane Vol: 99.0 ml 39.89 ml/m  AORTIC VALVE LVOT Vmax:   80.90 cm/s LVOT Vmean:  55.700 cm/s LVOT VTI:    0.172 m  AORTA Ao Root diam: 3.60 cm Ao Asc diam:  3.20 cm MITRAL VALVE                TRICUSPID VALVE MV Area (PHT): 2.96 cm     TR Peak grad:   46.5 mmHg MV Decel Time: 257 msec     TR Vmax:        341.00 cm/s MV E velocity: 142.00 cm/s                             SHUNTS  Systemic VTI:  0.17 m                             Systemic Diam: 2.00 cm Cherlynn Kaiser MD Electronically signed by Cherlynn Kaiser MD Signature Date/Time: 10/30/2020/4:12:09 PM    Final     Barton Dubois, MD  [facility]  If 7PM-7AM, please contact night-coverage [email or web address] Password [email or web address] 11/04/2020, 2:53 PM   LOS: 7 days

## 2020-11-05 DIAGNOSIS — E1129 Type 2 diabetes mellitus with other diabetic kidney complication: Secondary | ICD-10-CM | POA: Diagnosis not present

## 2020-11-05 DIAGNOSIS — D649 Anemia, unspecified: Secondary | ICD-10-CM | POA: Diagnosis not present

## 2020-11-05 DIAGNOSIS — E1122 Type 2 diabetes mellitus with diabetic chronic kidney disease: Secondary | ICD-10-CM | POA: Diagnosis not present

## 2020-11-05 DIAGNOSIS — M6281 Muscle weakness (generalized): Secondary | ICD-10-CM | POA: Diagnosis not present

## 2020-11-05 DIAGNOSIS — D472 Monoclonal gammopathy: Secondary | ICD-10-CM | POA: Diagnosis not present

## 2020-11-05 DIAGNOSIS — R2681 Unsteadiness on feet: Secondary | ICD-10-CM | POA: Diagnosis not present

## 2020-11-05 DIAGNOSIS — N183 Chronic kidney disease, stage 3 unspecified: Secondary | ICD-10-CM | POA: Diagnosis not present

## 2020-11-05 DIAGNOSIS — L03119 Cellulitis of unspecified part of limb: Secondary | ICD-10-CM | POA: Diagnosis not present

## 2020-11-05 DIAGNOSIS — L03115 Cellulitis of right lower limb: Secondary | ICD-10-CM | POA: Diagnosis not present

## 2020-11-05 DIAGNOSIS — D631 Anemia in chronic kidney disease: Secondary | ICD-10-CM | POA: Diagnosis not present

## 2020-11-05 DIAGNOSIS — N179 Acute kidney failure, unspecified: Secondary | ICD-10-CM | POA: Diagnosis not present

## 2020-11-05 DIAGNOSIS — M869 Osteomyelitis, unspecified: Secondary | ICD-10-CM

## 2020-11-05 DIAGNOSIS — I129 Hypertensive chronic kidney disease with stage 1 through stage 4 chronic kidney disease, or unspecified chronic kidney disease: Secondary | ICD-10-CM | POA: Diagnosis not present

## 2020-11-05 DIAGNOSIS — I878 Other specified disorders of veins: Secondary | ICD-10-CM | POA: Diagnosis not present

## 2020-11-05 DIAGNOSIS — E1121 Type 2 diabetes mellitus with diabetic nephropathy: Secondary | ICD-10-CM

## 2020-11-05 DIAGNOSIS — I5033 Acute on chronic diastolic (congestive) heart failure: Secondary | ICD-10-CM | POA: Diagnosis not present

## 2020-11-05 DIAGNOSIS — R5381 Other malaise: Secondary | ICD-10-CM | POA: Diagnosis not present

## 2020-11-05 DIAGNOSIS — L89513 Pressure ulcer of right ankle, stage 3: Secondary | ICD-10-CM | POA: Diagnosis not present

## 2020-11-05 DIAGNOSIS — R279 Unspecified lack of coordination: Secondary | ICD-10-CM | POA: Diagnosis not present

## 2020-11-05 DIAGNOSIS — I10 Essential (primary) hypertension: Secondary | ICD-10-CM | POA: Diagnosis not present

## 2020-11-05 DIAGNOSIS — N184 Chronic kidney disease, stage 4 (severe): Secondary | ICD-10-CM | POA: Diagnosis not present

## 2020-11-05 DIAGNOSIS — R41841 Cognitive communication deficit: Secondary | ICD-10-CM | POA: Diagnosis not present

## 2020-11-05 DIAGNOSIS — I509 Heart failure, unspecified: Secondary | ICD-10-CM | POA: Diagnosis not present

## 2020-11-05 DIAGNOSIS — E11622 Type 2 diabetes mellitus with other skin ulcer: Secondary | ICD-10-CM | POA: Diagnosis not present

## 2020-11-05 DIAGNOSIS — E1169 Type 2 diabetes mellitus with other specified complication: Secondary | ICD-10-CM | POA: Diagnosis not present

## 2020-11-05 DIAGNOSIS — R2689 Other abnormalities of gait and mobility: Secondary | ICD-10-CM | POA: Diagnosis not present

## 2020-11-05 DIAGNOSIS — D508 Other iron deficiency anemias: Secondary | ICD-10-CM | POA: Diagnosis not present

## 2020-11-05 DIAGNOSIS — R600 Localized edema: Secondary | ICD-10-CM | POA: Diagnosis not present

## 2020-11-05 DIAGNOSIS — J9601 Acute respiratory failure with hypoxia: Secondary | ICD-10-CM | POA: Diagnosis not present

## 2020-11-05 DIAGNOSIS — Z79899 Other long term (current) drug therapy: Secondary | ICD-10-CM | POA: Diagnosis not present

## 2020-11-05 DIAGNOSIS — E440 Moderate protein-calorie malnutrition: Secondary | ICD-10-CM | POA: Diagnosis not present

## 2020-11-05 DIAGNOSIS — E669 Obesity, unspecified: Secondary | ICD-10-CM | POA: Diagnosis not present

## 2020-11-05 LAB — RENAL FUNCTION PANEL
Albumin: 3 g/dL — ABNORMAL LOW (ref 3.5–5.0)
Anion gap: 15 (ref 5–15)
BUN: 83 mg/dL — ABNORMAL HIGH (ref 8–23)
CO2: 25 mmol/L (ref 22–32)
Calcium: 8.8 mg/dL — ABNORMAL LOW (ref 8.9–10.3)
Chloride: 95 mmol/L — ABNORMAL LOW (ref 98–111)
Creatinine, Ser: 5.2 mg/dL — ABNORMAL HIGH (ref 0.61–1.24)
GFR, Estimated: 11 mL/min — ABNORMAL LOW (ref >60)
Glucose, Bld: 91 mg/dL (ref 70–99)
Phosphorus: 5.4 mg/dL — ABNORMAL HIGH (ref 2.5–4.6)
Potassium: 3.6 mmol/L (ref 3.5–5.1)
Sodium: 135 mmol/L (ref 135–145)

## 2020-11-05 LAB — GLUCOSE, CAPILLARY
Glucose-Capillary: 125 mg/dL — ABNORMAL HIGH (ref 70–99)
Glucose-Capillary: 126 mg/dL — ABNORMAL HIGH (ref 70–99)
Glucose-Capillary: 114 mg/dL — ABNORMAL HIGH (ref 70–99)

## 2020-11-05 LAB — SURGICAL PATHOLOGY

## 2020-11-05 MED ORDER — DOXYCYCLINE HYCLATE 100 MG PO CAPS: 100.0000 mg | ORAL_CAPSULE | Freq: Two times a day (BID) | ORAL | Status: AC

## 2020-11-05 MED ORDER — HYDROCERIN EX CREA: TOPICAL_CREAM | CUTANEOUS | 0 refills | Status: AC

## 2020-11-05 MED ORDER — SEVELAMER CARBONATE 800 MG PO TABS
800.0000 mg | ORAL_TABLET | Freq: Three times a day (TID) | ORAL | Status: DC
  Administered 2020-11-05 (×3): 800 mg via ORAL
  Filled 2020-11-05 (×2): qty 1

## 2020-11-05 MED ORDER — TORSEMIDE 20 MG PO TABS: 50.0000 mg | ORAL_TABLET | Freq: Two times a day (BID) | ORAL | Status: DC

## 2020-11-05 MED ORDER — COLLAGENASE 250 UNIT/GM EX OINT
TOPICAL_OINTMENT | CUTANEOUS | 0 refills | Status: DC
Start: 2020-11-05 — End: 2021-09-05

## 2020-11-05 MED ORDER — NOVOLIN 70/30 RELION (70-30) 100 UNIT/ML ~~LOC~~ SUSP: 30.0000 [IU] | Freq: Two times a day (BID) | SUBCUTANEOUS | Status: DC

## 2020-11-05 MED ORDER — SEVELAMER CARBONATE 800 MG PO TABS: 800.0000 mg | ORAL_TABLET | Freq: Three times a day (TID) | ORAL | Status: DC

## 2020-11-05 MED ORDER — HYDRALAZINE HCL 100 MG PO TABS: 100.0000 mg | ORAL_TABLET | Freq: Three times a day (TID) | ORAL | Status: DC

## 2020-11-05 MED ORDER — METOPROLOL SUCCINATE ER 25 MG PO TB24: 25.0000 mg | ORAL_TABLET | Freq: Every evening | ORAL | Status: DC

## 2020-11-05 MED ORDER — NEPRO/CARBSTEADY PO LIQD: 237.0000 mL | Freq: Two times a day (BID) | ORAL | 0 refills | Status: DC

## 2020-11-05 NOTE — TOC Transition Note (Signed)
Transition of Care [facility]) - CM/SW Discharge Note   Patient Details  Name: Danny [name] MRN: [id number] Date of Birth: 1956/03/04  Transition of Care [facility]) CM/SW Contact:  Boneta Lucks, RN Phone Number: 11/05/2020, 1:07 PM   Clinical Narrative:   Patient is medically to discharge to [facility]. Debbie provided number for report and room number. RN to call report.  Insurance authorization received. TOC updated his sister, printed medical necessity.  Will sent DC summary in the hub and call EMS when RN is ready.   Final next level of care: Skilled Nursing Facility Barriers to Discharge: Barriers Resolved  Patient Goals and CMS Choice Patient states their goals for this hospitalization and ongoing recovery are:: to go to SNF. CMS Medicare.gov Compare Post Acute Care list provided to:: Patient Choice offered to / list presented to : Patient  Discharge Placement           Patient to be transferred to facility by: EMS Name of family member notified: Sister Patient and family notified of of transfer: 11/05/20  Discharge Plan and Services      HH Arranged: RN,PT [place] Agency: [facility] Date [place]: 10/26/20 Time [place]: [place] Representative spoke with at [place]: [place]   Readmission Risk Interventions Readmission Risk Prevention Plan 11/05/2020 03/24/2020 07/11/2019  Transportation Screening - Complete Complete  PCP or Specialist Appt within 3-5 Days Complete Not Complete -  Not Complete comments - - -  [place]RI or [place] - Complete -  Social Work Consult for [place] Planning/Counseling - Complete -  [place] - Not Applicable -  Medication Review Press photographer) - Complete Complete  PCP or Specialist appointment within 3-5 days of discharge - - Not Complete  [place]RI or [place] - - Complete  SW Recovery Care/Counseling Consult - - Complete  Palliative Care Screening - - Not  Complete  [place] - - Complete  Some recent data might be hidden

## 2020-11-05 NOTE — Care Management Important Message (Signed)
Important Message  Patient Details  Name: Danny [name] MRN: [id number] Date of Birth: [birth year]   Medicare Important Message Given:  Yes     [name] 11/05/2020, 12:56 PM

## 2020-11-05 NOTE — Discharge Summary (Signed)
Physician Discharge Summary  Danny [name] HWK:[id number] DOB: 1955/07/17 DOA: 10/25/2020  PCP: Celene Squibb, MD  Admit date: 10/25/2020 Discharge date: 11/05/2020  Time spent: 35 minutes  Recommendations for Outpatient Follow-up:  1. Repeat renal function panel to follow electrolytes stability and Cr trend  2. Follow final results of wound biopsy.   Discharge Diagnoses:  Principal Problem:   Cellulitis Active Problems:   Acute renal failure superimposed on stage 4 chronic kidney disease (HCC)   Controlled type 2 diabetes mellitus with stage 3 chronic kidney disease, with long-term current use of insulin (HCC)   Type 2 diabetes with nephropathy (HCC)   Diabetic ulcer of ankle (HCC)   Moderate protein-calorie malnutrition (HCC)   Medical non-compliance   Chronic ulcer of right ankle (HCC)   Venous stasis   Cellulitis, leg   CKD (chronic kidney disease) stage 4, GFR 15-29 ml/min (HCC)   Osteomyelitis of right ankle (HCC)   Discharge Condition: stable and improved. Discharge to SNF for further care and rehabilitation. Will follow up with PCP and nephrology service as an outpatient   Code status: Full code.  Diet recommendation: modified carb diet and heart healthy diet.  Filed Weights   11/03/20 0600 11/04/20 0500 11/05/20 0500  Weight: (!) 136.7 kg (!) 136.7 kg 124.4 kg    History of present illness:  65 y/owith a history of hypertension, diabetes mellitus type 2, CKD stage III, psoriasis, venous stasis dermatitis, lymphedema presenting with 2-weekhistory of his legs edema, pain, erythema and becoming weak. He reports he was placed on a 2 week course of doxy in the beginning of April with some improvement, but his legs have worsened since finishing doxy 1-2 weeks prior to this admission. He states he is WC bound and has not ambulated since July 2021. He reports his legs have been draining yellow clear and cloudy fluid.  In the ED Temp 97.5, heart rate 81, respiratory rate  16-18, blood pressure 132/76, satting at 97% No leukocytosis with white blood cell count of 7.4, hemoglobin 8.9 Chemistry panel reveals an elevated BUN and creatinine that are close to his baseline at 47 and 3.79 Glucose is 151 UA is not indicative of UTI CRP is 3.4, sed rate is 69 COVIDneg Blood cultures neg EKG shows a heart rate of 97, QTc 477, PVCs Chest x-ray is negative Alk phos is elevated 131 Admission was requested to antibiotics and wound care to patient. Does not have reliable meansto follow-up outpatient -patient was started on abx with some clinical improvement.  He was started on IV lasix for his anasarca.  Nephrology was consulted.  His serum creatinine is slowly rising after starting on IV lasix.  Hospital Course:  Bilateral lower extremity cellulitis-ankle open wound, chronic venous stasisdermatitis 1. Likely a component of cellulitis and a component of venous stasis contributing to the open wounds on patient's leg; Failed outpatient antibiotics. 2. Wound care consulted, also general surgery consulted for evaluation possible debridement. Appreciate Dr. Pamelia Hoit taken. Patient not interested in surgery or amputation at this time.  3. Vancomycin started in the ED.Marland KitchenMarland KitchenDiscontinued on 10/26/2020 4. Blood cultures>>>>neg to date 5. MRI ankle--no abscess, no osteomyelitis. Fatty atrophy of visualized musculature = chronic devervation 6. CRP 3.4, ESR 69 7. D/c zyvox. Started on cefazolin IV and subsequently discharge on doxycycline  8. Appreciate general surgery consult-->bx and culture done 5/3 9. SW/TOC assisting with setting up transport with [facility]and appointment with outpatient wound care clinic 10. Right heel repeat biopsy by Dr. Constance Haw  b/c cannot r/o SCC on prior bx on 10/26/20; repeat biopsy final results pending. Might need outpatient follow up with terciary facility podiatrist for further eval/management.   New Onset Atrial  fibrillation -personally reviewed EKG--Afib, nonspecific T wave change -rate controlled -CHADS-VASc = 5 (CHF, HTN, Age, DM, ASVD) -continue metoprolol succinate -started on apixaban -5/7EchoEF 55-60%; mild decrease RV fxn, RVSP 61.5 -TSH 2.028  Secondary hyperparathyroidism  -started on renvela  Acute on chronicCKD 4 -Trend BUN/creatinine--uptrending -still appear volume overloaded -baseline creatinine 3.7-4.0 -serum creatinine up to 4.78>>5.19>>5.15>>5.20>>5.50>>5.20 -renal consult appreciated-discussed with Dr. Carolin Sicks -Following recommendations by nephrology service IV Lasix has been discontinued, albumin infusion x2 will given and patient started on Demadex. -Patient will follow up with Primary nephrologist    Fall and Generalized weakness 1. Mechanical fall no LOC, did not hit head 2. Consult PT-->SNF  DMII-uncontrolledwith hyperglycemia 1. CBG QA CHS, SSI coverage, 2. Resume home hypoglycemic 70/30 3. A1c:7.6 4. Continue Carb modified diet  Moderate protein cal malnutrition 1. Albumin 2.8 on admission.  2. Counseled on the importance of nutrient dense diet; and appropriate intake of protein.  Will add Nepro BID. 3. Continue to follow albumin trend   Medical non compliance 1. TOC consulted for possible PT at home?Any assistance with transportation for medical appointments to be explored at time of discharge. 2. Discharge to SNF for rehabilitation and conditioning.  Class 2 Obesity -Body mass index is 39.76 kg/m. -Low calorie diet, portion control and increase physical activity has been discussed with patient.  Atypical Chest pain -had CP 10/30/20 -No further chest pain reported -EKG without acute ischemic changes -No abnormalities appreciated on telemetry -Troponin has been negative. -continue metoprolol and aspirin  Procedures:  See below for X-ray reports.   Skin biopsy   Consultations:  General surgery  Nephrology   Discharge  Exam: Vitals:   11/04/20 2129 11/05/20 0501  BP: 129/84 (!) 113/47  Pulse:  (!) 109  Resp:  19  Temp:  98.2 F (36.8 C)  SpO2:  97%    General: Good urine output, no chest pain, no nausea, no vomiting, reports feeling much better today. Cardiovascular: Rate controlled, no rubs, no gallops, no JVD on exam. Respiratory: Good air movement bilaterally; no Accessory muscles.  Good oxygen saturation on room air. Abdomen: Obese, soft, nontender, distended, positive bowel sounds. Extremities: No cyanosis or clubbing.  Clean dressings in place.  Some chronic venous stasis to dermatitis and improvement in swelling and erythema appreciated. Skin: No petechiae,.  As mentioned above chronic excessive dermatitis, 1+ lymphedema open right ankle lateral malleolus chronic wound without purulent discharge.  Discharge Instructions   Discharge Instructions    Ambulatory referral to Wound Clinic   Complete by: As directed    Please eval and treat chronic leg wounds/venous stasis.  Will need UNNA boot   Diet - low sodium heart healthy   Complete by: As directed    Discharge instructions   Complete by: As directed    Take medications as prescribed Maintain adequate hydration Follow up with nephrology as instructed Follow heart healthy/low sodium and modified carbohydrates diet   Discharge wound care:   Complete by: As directed    Wound care to right lateral malleolus: Wash legs with soap and water, rinse and pat dry. Apply Eucerin cream to legs and feet.  Apply collagenase daily in a 1/8 inch layer to right lateral malleolus. Top with saline moistened gauze 2 x 2, then 4 x 4's.   Wound Care of bilateral LE's:  Wash legs with soap and water, rinse and pat dry. Apply Eucerin cream to legs and feet, do not apply between toes. Please cover areas of dry, scabbed tissue with folded xeroform gauze topped with ABD pads. Secure from just below knees with kerlix roll gauze wraps. Top kerlix with 6 inch ACE  bandage wrapped in a similar manner. Place feet into Prevalon boots while on bed or chair.     Allergies as of 11/05/2020      Reactions   Dust Mite Extract Itching, Other (See Comments)   Unknown reaction-potential shortness of breath   Prednisone Nausea And Vomiting   Rocephin [ceftriaxone] Nausea And Vomiting      Medication List    STOP taking these medications   amLODipine 10 MG tablet Commonly known as: NORVASC   ketotifen 0.025 % ophthalmic solution Commonly known as: Allergy Eye Drops   Magnesium 100 MG Caps     TAKE these medications   acetaminophen 500 MG tablet Commonly known as: TYLENOL Take 1,000 mg by mouth every 6 (six) hours as needed for moderate pain or headache.   aspirin 325 MG tablet Take 325 mg by mouth daily.   cholecalciferol 25 MCG (1000 UNIT) tablet Commonly known as: VITAMIN D3 Take 1,000 Units by mouth daily.   collagenase ointment Commonly known as: SANTYL Apply to right lateral malleolus once daily; apply in a 1/8 inch layer and top with saline moistened gauze 2X2.   doxycycline 100 MG capsule Commonly known as: VIBRAMYCIN Take 1 capsule (100 mg total) by mouth 2 (two) times daily for 10 days.   epoetin alfa-epbx 10000 UNIT/ML injection Commonly known as: RETACRIT 8,000 Units every 14 (fourteen) days.   feeding supplement (NEPRO CARB STEADY) Liqd Take 237 mLs by mouth 2 (two) times daily between meals.   ferrous sulfate 325 (65 FE) MG tablet Take 325 mg by mouth 2 (two) times daily with a meal.   FISH OIL PO Take 2,400 mg by mouth 2 (two) times daily.   hydrALAZINE 100 MG tablet Commonly known as: APRESOLINE Take 1 tablet (100 mg total) by mouth 3 (three) times daily. What changed: when to take this   hydrocerin Crea Apply to bilateral lower extremities after washing with soap and water, rinsing and patting dry daily.   metoprolol succinate 25 MG 24 hr tablet Commonly known as: TOPROL-XL Take 1 tablet (25 mg total) by  mouth every evening. What changed:   medication strength  how much to take   multivitamin with minerals Tabs tablet Take 1 tablet by mouth daily.   NovoLIN 70/30 ReliOn (70-30) 100 UNIT/ML injection Generic drug: insulin NPH-regular Human Inject 30 Units into the skin in the morning and at bedtime. What changed: how much to take   pravastatin 80 MG tablet Commonly known as: PRAVACHOL Take 1 tablet (80 mg total) by mouth every evening.   sevelamer carbonate 800 MG tablet Commonly known as: RENVELA Take 1 tablet (800 mg total) by mouth 3 (three) times daily with meals.   torsemide 20 MG tablet Commonly known as: DEMADEX Take 2.5 tablets (50 mg total) by mouth 2 (two) times daily. What changed:   how much to take  how to take this  when to take this  additional instructions   vitamin B-12 1000 MCG tablet Commonly known as: CYANOCOBALAMIN Take 1,000 mcg by mouth daily.   Vitamin C 500 MG Chew Chew 1,000 mg by mouth daily.   vitamin E 1000 UNIT capsule Take 1,000 Units   by mouth daily.   Zinc 30 MG Caps Take 1 capsule by mouth daily.            Discharge Care Instructions  (From admission, onward)         Start     Ordered   11/05/20 0000  Discharge wound care:       Comments: Wound care to right lateral malleolus: Wash legs with soap and water, rinse and pat dry. Apply Eucerin cream to legs and feet.  Apply collagenase daily in a 1/8 inch layer to right lateral malleolus. Top with saline moistened gauze 2 x 2, then 4 x 4's.   Wound Care of bilateral LE's: Wash legs with soap and water, rinse and pat dry. Apply Eucerin cream to legs and feet, do not apply between toes. Please cover areas of dry, scabbed tissue with folded xeroform gauze topped with ABD pads. Secure from just below knees with kerlix roll gauze wraps. Top kerlix with 6 inch ACE bandage wrapped in a similar manner. Place feet into Prevalon boots while on bed or chair.   11/05/20 1245          Allergies  Allergen Reactions  . Dust Mite Extract Itching and Other (See Comments)    Unknown reaction-potential shortness of breath  . Prednisone Nausea And Vomiting  . Rocephin [Ceftriaxone] Nausea And Vomiting    Contact information for follow-up providers    Hall, John Z, MD. Schedule an appointment as soon as possible for a visit in 2 week(s).   Specialty: Internal Medicine Why: after discharge from SNF Contact information: [address] [place] [address] [phone]        Bhutani, Manpreet S, MD. Schedule an appointment as soon as possible for a visit in 10 day(s).   Specialty: Nephrology Contact information: [address] [place] [address] [phone]            Contact information for after-discharge care    Destination    [facility] .   Service: Skilled Nursing Contact information: [address] [place] [address] [phone]                  The results of significant diagnostics from this hospitalization (including imaging, microbiology, ancillary and laboratory) are listed below for reference.    Significant Diagnostic Studies: DG Chest 2 View  Result Date: 10/25/2020 CLINICAL DATA:  CHF with leg swelling and ulcer to right lateral malleolus. EXAM: CHEST - 2 VIEW COMPARISON:  09/26/20 FINDINGS: There is mild cardiac enlargement, stable from previous exam. No pleural effusion or interstitial edema identified. No airspace densities. IMPRESSION: No acute findings.  No evidence for congestive heart failure. Electronically Signed   By: Taylor  Stroud M.D.   On: 10/25/2020 14:21   DG Ankle Complete Right  Result Date: 10/25/2020 CLINICAL DATA:  CHF, lower extremity swelling, lateral malleolar ulcer with concern for infection, no reported injury EXAM: RIGHT ANKLE - COMPLETE 3+ VIEW COMPARISON:  09/26/2020 right ankle radiographs FINDINGS: Diffuse right ankle soft tissue swelling.  Small soft tissue defect at the lateral malleolus compatible with reported ulcer. Subtle loss of cortical line at the lateral distal margin of the lateral malleolus suspicious for early bony erosion. No fracture. No subluxation. Severe tibiotalar osteoarthritis. Intact staples at the lateral aspect of the calcaneus. Partially visualized intact screw at the base of fifth metatarsal. No focal osseous lesions. IMPRESSION: Diffuse right ankle soft tissue swelling. Small soft tissue   defect at the lateral malleolus compatible with reported ulcer. Subtle loss of cortical line at the lateral distal margin of the lateral malleolus, suspicious for early bony erosion due to osteomyelitis. Consider MRI of the right ankle without IV contrast for further evaluation as clinically warranted. Electronically Signed   By: Jason A Poff M.D.   On: 10/25/2020 14:24   MR ANKLE RIGHT WO CONTRAST  Result Date: 10/26/2020 CLINICAL DATA:  Lateral right ankle ulceration EXAM: MRI OF THE RIGHT ANKLE WITHOUT CONTRAST TECHNIQUE: Multiplanar, multisequence MR imaging of the ankle was performed. No intravenous contrast was administered. COMPARISON:  X-ray 10/25/2020 FINDINGS: TENDONS Peroneal: Tendinosis with longitudinal split tear of the infra-malleolar aspect of the peroneus brevis tendon. Intact peroneus longus tendon. No tenosynovitis. Posteromedial: Intact tibialis posterior, flexor hallucis longus and flexor digitorum longus tendons. Anterior: Intact tibialis anterior, extensor hallucis longus and extensor digitorum longus tendons. Achilles: Intact. Plantar Fascia: Intact. LIGAMENTS Lateral: Anterior talofibular ligament and calcaneofibular ligaments are not clearly seen, and likely chronically torn. Posterior talofibular ligament is heterogeneous but intact. Intact anterior and posterior tibiofibular ligaments. Medial: Deltoid ligament appears chronically torn. Portions of the spring ligament complex are ill-defined. CARTILAGE Ankle  Joint: Severe tibiotalar osteoarthritis with full-thickness cartilage loss most pronounced anteriorly. No joint effusion. Prominent osseous densities along the posterior aspect of the tibiotalar and subtalar joints, largest measuring up to 2.4 cm. Subtalar Joints/Sinus Tarsi: Moderate arthropathy of the posterior subtalar joint. No effusion. Preservation of the anatomic fat within the sinus tarsi. Bones: No acute fracture or dislocation. No bony erosion or cortical destruction. No marrow edema. Well healed calcaneal osteotomy. Metallic susceptibility artifact from hardware within the fifth metatarsal. Other: Soft tissue swelling with superficial ulceration overlying the lateral malleolus. No organized fluid collection. Diffuse edema-like signal of the visualized foot and lower leg musculature. There is also fatty atrophy of the visualized musculature compatible with chronic denervation. IMPRESSION: 1. Soft tissue swelling with superficial ulceration overlying the lateral malleolus. No organized fluid collection. No evidence of osteomyelitis. 2. Severe tibiotalar osteoarthritis with prominent osseous densities along the posterior aspect of the tibiotalar and subtalar joints, largest measuring up to 2.4 cm. 3. Fatty atrophy and edema throughout the visualized foot and lower leg musculature, likely representing a combination of denervation changes and/or myositis. Findings are in agreement with the preliminary report provided by Dr. Fujinaga. Preliminary report was called to the ordering provider Margaux Venter, PA at 5:54 p.m. on 10/25/2020 by Dr. Fujinaga. Electronically Signed   By: Nicholas  Plundo D.O.   On: 10/26/2020 08:32   ECHOCARDIOGRAM COMPLETE  Result Date: 10/30/2020    ECHOCARDIOGRAM REPORT   Patient Name:   Tamon Berk Date of Exam: 10/30/2020 Medical Rec #:  [id number]   Height:       73.0 in Accession #:    [id number]  Weight:       279.8 lb Date of Birth:  12/20/1955    BSA:          2.482 m Patient  Age:    65 years    BP:           115/71 mmHg Patient Gender: M           HR:           81 bpm. Exam Location:  [facility] Procedure: 2D Echo, Cardiac Doppler and Color Doppler Indications:    I48.0 Paroxysmal atrial fibrillation  History:        Patient has prior history of Echocardiogram examinations, most                   recent 02/24/2019. Risk Factors:Hypertension and Diabetes. CKD.  Sonographer:    Tiffany Dance Referring Phys: [id number] DAVID TAT  Sonographer Comments: Technically difficult study due to poor echo windows. IMPRESSIONS  1. Left ventricular ejection fraction, by estimation, is 55 to 60%. The left ventricle has normal function. Left ventricular endocardial border not optimally defined to evaluate regional wall motion. There is moderate left ventricular hypertrophy. Left ventricular diastolic parameters are indeterminate.  2. Right ventricular systolic function is mildly reduced. The right ventricular size is moderately enlarged. There is severely elevated pulmonary artery systolic pressure. The estimated right ventricular systolic pressure is 61.5 mmHg.  3. Left atrial size was mildly dilated.  4. Right atrial size was moderately dilated.  5. The mitral valve is normal in structure. Mild mitral valve regurgitation. No evidence of mitral stenosis.  6. The aortic valve is grossly normal. Aortic valve regurgitation is not visualized. No aortic stenosis is present.  7. The inferior vena cava is dilated in size with <50% respiratory variability, suggesting right atrial pressure of 15 mmHg. FINDINGS  Left Ventricle: Left ventricular ejection fraction, by estimation, is 55 to 60%. The left ventricle has normal function. Left ventricular endocardial border not optimally defined to evaluate regional wall motion. The left ventricular internal cavity size was normal in size. There is moderate left ventricular hypertrophy. Left ventricular diastolic parameters are indeterminate. Right Ventricle: The right  ventricular size is moderately enlarged. No increase in right ventricular wall thickness. Right ventricular systolic function is mildly reduced. There is severely elevated pulmonary artery systolic pressure. The tricuspid regurgitant velocity is 3.41 m/s, and with an assumed right atrial pressure of 15 mmHg, the estimated right ventricular systolic pressure is 61.5 mmHg. Left Atrium: Left atrial size was mildly dilated. Right Atrium: Right atrial size was moderately dilated. Pericardium: There is no evidence of pericardial effusion. Mitral Valve: The mitral valve is normal in structure. Mild mitral valve regurgitation. No evidence of mitral valve stenosis. Tricuspid Valve: The tricuspid valve is normal in structure. Tricuspid valve regurgitation is not demonstrated. No evidence of tricuspid stenosis. Aortic Valve: The aortic valve is grossly normal. Aortic valve regurgitation is not visualized. No aortic stenosis is present. Pulmonic Valve: The pulmonic valve was normal in structure. Pulmonic valve regurgitation is not visualized. No evidence of pulmonic stenosis. Aorta: The aortic root is normal in size and structure. Venous: The inferior vena cava is dilated in size with less than 50% respiratory variability, suggesting right atrial pressure of 15 mmHg. IAS/Shunts: The interatrial septum was not well visualized.  LEFT VENTRICLE PLAX 2D LVIDd:         4.67 cm  Diastology LVIDs:         3.08 cm  LV e' medial:    6.57 cm/s LV PW:         1.67 cm  LV E/e' medial:  21.6 LV IVS:        1.37 cm  LV e' lateral:   7.36 cm/s LVOT diam:     2.00 cm  LV E/e' lateral: 19.3 LV SV:         54 LV SV Index:   22 LVOT Area:     3.14 cm  RIGHT VENTRICLE RV Basal diam:  3.79 cm RV Mid diam:    3.04 cm RV S prime:     11.10 cm/s TAPSE (M-mode): 1.6 cm LEFT ATRIUM             Index       RIGHT ATRIUM             Index LA diam:        5.00 cm 2.01 cm/m  RA Area:     29.80 cm LA Vol (A2C):   98.6 ml 39.73 ml/m RA Volume:   108.00 ml  43.52 ml/m LA Vol (A4C):   96.5 ml 38.89 ml/m LA Biplane Vol: 99.0 ml 39.89 ml/m  AORTIC VALVE LVOT Vmax:   80.90 cm/s LVOT Vmean:  55.700 cm/s LVOT VTI:    0.172 m  AORTA Ao Root diam: 3.60 cm Ao Asc diam:  3.20 cm MITRAL VALVE                TRICUSPID VALVE MV Area (PHT): 2.96 cm     TR Peak grad:   46.5 mmHg MV Decel Time: 257 msec     TR Vmax:        341.00 cm/s MV E velocity: 142.00 cm/s                             SHUNTS                             Systemic VTI:  0.17 m                             Systemic Diam: 2.00 cm Gayatri Acharya MD Electronically signed by Gayatri Acharya MD Signature Date/Time: 10/30/2020/4:12:09 PM    Final     Microbiology: Recent Results (from the past 240 hour(s))  Fungus Culture With Stain     Status: None (Preliminary result)   Collection Time: 10/26/20  3:59 PM   Specimen: Ankle  Result Value Ref Range Status   Fungus Stain Final report  Final    Comment: (NOTE) Performed At: [facility] [address] [place], [place] [id number] Nagendra Sanjai MD Ph:[phone]    Fungus (Mycology) Culture PENDING  Incomplete   Fungal Source ANKLE  Final    Comment: WOUND Performed at [facility], [address] [place], [place] [address]   Aerobic/Anaerobic Culture w Gram Stain (surgical/deep wound)     Status: Abnormal   Collection Time: 10/26/20  3:59 PM   Specimen: Tissue  Result Value Ref Range Status   Specimen Description TISSUE RIGHT ANKLE  Final   Special Requests NONE  Final   Gram Stain   Final    MODERATE WBC PRESENT,BOTH PMN AND MONONUCLEAR FEW SQUAMOUS EPITHELIAL CELLS PRESENT ABUNDANT GRAM NEGATIVE RODS MODERATE GRAM POSITIVE COCCI IN PAIRS MODERATE GRAM POSITIVE RODS    Culture (A)  Final    MULTIPLE ORGANISMS PRESENT, NONE PREDOMINANT NO GROUP A STREP (S.PYOGENES) ISOLATED NO STAPHYLOCOCCUS AUREUS ISOLATED NO ANAEROBES ISOLATED Performed at [facility], [address] , [place] [address]    Report Status  11/01/2020 FINAL  Final  Fungus Culture Result     Status: None   Collection Time: 10/26/20  3:59 PM  Result Value Ref Range Status   Result 1 Comment  Final    Comment: (NOTE) KOH/Calcofluor preparation:  no fungus observed. Performed At: [facility] [address] [place],  [id number] Nagendra Sanjai MD Ph:[phone]      Labs: Basic Metabolic Panel: Recent Labs  Lab 11/01/20 0442 11/02/20 0643 11/03/20 0514 11/04/20 0522 11/05/20 0522  NA 134* 135 135 133* 135  K 4.6 4.7 4.1 3.8 3.6  CL 104 103 102 100   95*  CO2 21* _0 GLUCOSE 91 81 88 114* 91  BUN 71* 74* 80* 85* 83*  CREATININE 5.19* 5.15* 5.20* 5.50* 5.20*  CALCIUM 8.3* 8.5* 8.8* 8.6* 8.8*  MG 2.2  --   --   --   --   PHOS  --  5.2* 5.0* 5.1* 5.4*   Liver Function Tests: Recent Labs  Lab 11/02/20 0643 11/03/20 0514 11/04/20 0522 11/05/20 0522  ALBUMIN 2.6* 2.7* 2.7* 3.0*   CBC: Recent Labs  Lab 10/30/20 0534 10/31/20 0419 11/01/20 0442 11/02/20 0643  WBC 6.7 7.0 5.9 5.5  HGB 8.6* 8.4* 8.4* 8.4*  HCT 27.0* 26.7* 26.6* 25.6*  MCV 86.3 85.6 85.0 83.1  PLT 205 194 215 206   BNP (last 3 results) Recent Labs    03/23/20 1205 10/25/20 1327  BNP 351.0* 351.0*    CBG: Recent Labs  Lab 11/04/20 1104 11/04/20 1616 11/04/20 2013 11/04/20 2125 11/05/20 1123  GLUCAP 175* 141* 145* 138* 125*    Signed:  Barton Dubois MD.  [facility] 11/05/2020, 12:57 PM

## 2020-11-05 NOTE — Progress Notes (Addendum)
[facility] NEPHROLOGY PROGRESS NOTE  Assessment/ Plan: Pt is a 65 y.o. yo male  With HTN, DM2, chronic venous stasis dermatitis/lymphedema, anemia, CKD stage IV admitted with lower extremity cellulitis failed outpatient antibiotics, seen as a consultation for AKI on CKD.  #Acute kidney injury on CKD stage IV with significant proteinuria and progressive CKD thought to be due to diabetic nephropathy and CHF/fluid overload.  Followed by Dr. Theador Hawthorne at [facility] and had been referred to vascular surgeon for permanent access, however he did not keep appointment.  Baseline creatinine seems to be around 2.7-3.96.  He received IV Lasix with good response, net fluid negative by 10.8 L. Switched to oral torsemide 50 mg twice a day with excellent urine output of 2.5 L in 24 hours.  BUN and creatinine level is stable.  Patient reports feeling great without any uremic symptoms.  No need to start dialysis in the hospital, as patient is hesitant to do so as well.  I think he can wait until permanent access is placed before starting dialysis, needs vascular referral. Recommend to discharge on current oral diuretics.  I have called patient's nephrologist Dr. Theador Hawthorne this morning to discuss above.  #Bilateral lower extremity cellulitis, chronic venous stasis dermatitis and lymphedema: Seen by wound care, general surgery and had biopsy done.  Antibiotics per primary team.  #New onset A. fib: On metoprolol and Eliquis.  # Anemia of CKD and chronic inflammatory state: Received Epogen.  No iron because of infection.  He receives ESA injection as outpatient.  # HTN/volume: Blood pressure acceptable, volume management as above.  #Hyponatremia, hypervolemic: On loop diuretics as above.  #CKD-MBD: On oral vitamin D.  Monitor calcium phosphorus level.  Start Renvela.  #Morbid obesity  Ok to discharge.  He will follow with Dr. Theador Hawthorne as outpatient.  Recommend to check renal panel in a week.  Discussed with  primary team. Please call us with question.  Subjective: Seen and examined at bedside.  Urine output 2.5 L in 24 hours.  He reports good night sleep.  This morning is sitting on chair with good mood.  Denies nausea, vomiting, dysgeusia, chest pain, shortness of breath.  Objective Vital signs in last 24 hours: Vitals:   11/04/20 2011 11/04/20 2129 11/05/20 0500 11/05/20 0501  BP: 122/71 129/84  (!) 113/47  Pulse: (!) 109   (!) 109  Resp: 20   19  Temp: 98 F (36.7 C)   98.2 F (36.8 C)  TempSrc: Oral   Oral  SpO2: 98%   97%  Weight:   124.4 kg   Height:       Weight change: -12.3 kg  Intake/Output Summary (Last 24 hours) at 11/05/2020 0905 Last data filed at 11/05/2020 0834 Gross per 24 hour  Intake 708 ml  Output 3280 ml  Net -2572 ml       Labs: Basic Metabolic Panel: Recent Labs  Lab 11/03/20 0514 11/04/20 0522 11/05/20 0522  NA 135 133* 135  K 4.1 3.8 3.6  CL 102 100 95*  CO2 23 23 25   GLUCOSE 88 114* 91  BUN 80* 85* 83*  CREATININE 5.20* 5.50* 5.20*  CALCIUM 8.8* 8.6* 8.8*  PHOS 5.0* 5.1* 5.4*   Liver Function Tests: Recent Labs  Lab 11/03/20 0514 11/04/20 0522 11/05/20 0522  ALBUMIN 2.7* 2.7* 3.0*   No results for input(s): LIPASE, AMYLASE in the last 168 hours. No results for input(s): AMMONIA in the last 168 hours. CBC: Recent Labs  Lab 10/30/20 0534  10/31/20 0419 11/01/20 0442 11/02/20 0643  WBC 6.7 7.0 5.9 5.5  HGB 8.6* 8.4* 8.4* 8.4*  HCT 27.0* 26.7* 26.6* 25.6*  MCV 86.3 85.6 85.0 83.1  PLT 205 194 215 206   Cardiac Enzymes: No results for input(s): CKTOTAL, CKMB, CKMBINDEX, TROPONINI in the last 168 hours. CBG: Recent Labs  Lab 11/04/20 0714 11/04/20 1104 11/04/20 1616 11/04/20 2013 11/04/20 2125  GLUCAP 110* 175* 141* 145* 138*    Iron Studies: No results for input(s): IRON, TIBC, TRANSFERRIN, FERRITIN in the last 72 hours. Studies/Results: No results found.  Medications: Infusions: .  ceFAZolin (ANCEF) IV Stopped  (11/04/20 2211)    Scheduled Medications: . aspirin  325 mg Oral Daily  . cholecalciferol  1,000 Units Oral Daily  . collagenase   Topical Daily  . doxycycline  100 mg Oral Q12H  . epoetin alfa-epbx  8,000 Units Subcutaneous Q14 Days  . feeding supplement (NEPRO CARB STEADY)  237 mL Oral BID BM  . heparin  5,000 Units Subcutaneous Q8H  . hydrALAZINE  100 mg Oral TID  . hydrocerin   Topical Daily  . insulin aspart  0-15 Units Subcutaneous TID WC  . insulin aspart  0-5 Units Subcutaneous QHS  . insulin detemir  20 Units Subcutaneous QHS  . metoprolol succinate  25 mg Oral QPM  . nystatin   Topical TID  . polyethylene glycol  17 g Oral Daily  . pravastatin  80 mg Oral QPM  . senna  2 tablet Oral Daily  . torsemide  50 mg Oral BID  . vitamin C  1,000 mg Oral Daily    have reviewed scheduled and prn medications.  Physical Exam: General:NAD, comfortable Heart:RRR, s1s2 nl, no rub Lungs: Clear b/l, no crackle Abdomen:soft, Non-tender, non-distended Extremities: Bilateral lower extremities has chronic dermatitis, dressing applied and some edema. Dialysis Access: None  [name] 11/05/2020,9:05 AM  LOS: 8 days

## 2020-11-05 NOTE — Progress Notes (Signed)
Pt out with EMS at 2010.

## 2020-11-08 DIAGNOSIS — R600 Localized edema: Secondary | ICD-10-CM | POA: Diagnosis not present

## 2020-11-08 DIAGNOSIS — I10 Essential (primary) hypertension: Secondary | ICD-10-CM | POA: Diagnosis not present

## 2020-11-08 DIAGNOSIS — Z79899 Other long term (current) drug therapy: Secondary | ICD-10-CM | POA: Diagnosis not present

## 2020-11-08 DIAGNOSIS — E1169 Type 2 diabetes mellitus with other specified complication: Secondary | ICD-10-CM | POA: Diagnosis not present

## 2020-11-09 DIAGNOSIS — I509 Heart failure, unspecified: Secondary | ICD-10-CM | POA: Diagnosis not present

## 2020-11-09 DIAGNOSIS — R600 Localized edema: Secondary | ICD-10-CM | POA: Diagnosis not present

## 2020-11-09 DIAGNOSIS — R5381 Other malaise: Secondary | ICD-10-CM | POA: Diagnosis not present

## 2020-11-10 DIAGNOSIS — L89513 Pressure ulcer of right ankle, stage 3: Secondary | ICD-10-CM | POA: Diagnosis not present

## 2020-11-10 DIAGNOSIS — M6281 Muscle weakness (generalized): Secondary | ICD-10-CM | POA: Diagnosis not present

## 2020-11-10 DIAGNOSIS — E669 Obesity, unspecified: Secondary | ICD-10-CM | POA: Diagnosis not present

## 2020-11-10 DIAGNOSIS — N184 Chronic kidney disease, stage 4 (severe): Secondary | ICD-10-CM | POA: Diagnosis not present

## 2020-11-11 ENCOUNTER — Other Ambulatory Visit (HOSPITAL_COMMUNITY): Payer: Self-pay

## 2020-11-11 MED ORDER — LACTULOSE 20 GM/30ML PO SOLN: 20.0000 g | Freq: Every day | ORAL | 1 refills | Status: DC

## 2020-11-11 NOTE — Telephone Encounter (Signed)
Call received from patient stating that he has not been able to get a stool sample and has had no BM since Sunday. Dr. Delton Coombes made aware. Lactulose prescription given. Patient instructed on how to take medication via VM. Instructed the patient to call back with any questions or concerns

## 2020-11-12 ENCOUNTER — Ambulatory Visit (HOSPITAL_COMMUNITY): Payer: Medicare HMO | Admitting: Physician Assistant

## 2020-11-15 ENCOUNTER — Other Ambulatory Visit (HOSPITAL_COMMUNITY): Payer: Self-pay | Admitting: Physician Assistant

## 2020-11-15 DIAGNOSIS — N184 Chronic kidney disease, stage 4 (severe): Secondary | ICD-10-CM

## 2020-11-15 DIAGNOSIS — D631 Anemia in chronic kidney disease: Secondary | ICD-10-CM

## 2020-11-15 NOTE — Progress Notes (Signed)
Can we call and notify patient that the latest biopsies showed now cancer just reactive hyperplasia that can mimic cancer.  He still needs to be referred to [facility] Dr. Sheran Lawless Dial DPM after my discussion with Dr. Caprice Beaver given the chronic wound. Can you get him referred over there if he wants.

## 2020-11-16 ENCOUNTER — Inpatient Hospital Stay (HOSPITAL_COMMUNITY): Payer: Medicare HMO

## 2020-11-16 ENCOUNTER — Other Ambulatory Visit: Payer: Self-pay

## 2020-11-16 ENCOUNTER — Inpatient Hospital Stay (HOSPITAL_COMMUNITY): Payer: Medicare HMO | Attending: Hematology | Admitting: Physician Assistant

## 2020-11-16 ENCOUNTER — Ambulatory Visit (HOSPITAL_COMMUNITY)
Admission: RE | Admit: 2020-11-16 | Discharge: 2020-11-16 | Disposition: A | Discharge status: Home / Self Care | Payer: Medicare HMO | Source: Ambulatory Visit | Attending: Physician Assistant | Admitting: Physician Assistant

## 2020-11-16 VITALS — BP 144/80 | HR 52 | Temp 96.8°F | Resp 18

## 2020-11-16 DIAGNOSIS — D631 Anemia in chronic kidney disease: Secondary | ICD-10-CM

## 2020-11-16 DIAGNOSIS — N184 Chronic kidney disease, stage 4 (severe): Secondary | ICD-10-CM | POA: Diagnosis not present

## 2020-11-16 DIAGNOSIS — D508 Other iron deficiency anemias: Secondary | ICD-10-CM | POA: Diagnosis not present

## 2020-11-16 DIAGNOSIS — D472 Monoclonal gammopathy: Secondary | ICD-10-CM | POA: Diagnosis not present

## 2020-11-16 LAB — CBC WITH DIFFERENTIAL/PLATELET
Abs Immature Granulocytes: 0.02 10*3/uL (ref 0.00–0.07)
Basophils Absolute: 0.1 10*3/uL (ref 0.0–0.1)
Basophils Relative: 1 %
Eosinophils Absolute: 0.4 10*3/uL (ref 0.0–0.5)
Eosinophils Relative: 6 %
HCT: 28.4 % — ABNORMAL LOW (ref 39.0–52.0)
Hemoglobin: 9.1 g/dL — ABNORMAL LOW (ref 13.0–17.0)
Immature Granulocytes: 0 %
Lymphocytes Relative: 10 %
Lymphs Abs: 0.8 10*3/uL (ref 0.7–4.0)
MCH: 27.1 pg (ref 26.0–34.0)
MCHC: 32 g/dL (ref 30.0–36.0)
MCV: 84.5 fL (ref 80.0–100.0)
Monocytes Absolute: 0.6 10*3/uL (ref 0.1–1.0)
Monocytes Relative: 8 %
Neutro Abs: 6 10*3/uL (ref 1.7–7.7)
Neutrophils Relative %: 75 %
Platelets: 232 10*3/uL (ref 150–400)
RBC: 3.36 MIL/uL — ABNORMAL LOW (ref 4.22–5.81)
RDW: 16.7 % — ABNORMAL HIGH (ref 11.5–15.5)
WBC: 7.9 10*3/uL (ref 4.0–10.5)
nRBC: 0 % (ref 0.0–0.2)

## 2020-11-16 LAB — COMPREHENSIVE METABOLIC PANEL
ALT: 12 U/L (ref 0–44)
AST: 24 U/L (ref 15–41)
Albumin: 3 g/dL — ABNORMAL LOW (ref 3.5–5.0)
Alkaline Phosphatase: 107 U/L (ref 38–126)
Anion gap: 11 (ref 5–15)
BUN: 72 mg/dL — ABNORMAL HIGH (ref 8–23)
CO2: 25 mmol/L (ref 22–32)
Calcium: 8.9 mg/dL (ref 8.9–10.3)
Chloride: 100 mmol/L (ref 98–111)
Creatinine, Ser: 4.66 mg/dL — ABNORMAL HIGH (ref 0.61–1.24)
GFR, Estimated: 13 mL/min — ABNORMAL LOW (ref >60)
Glucose, Bld: 171 mg/dL — ABNORMAL HIGH (ref 70–99)
Potassium: 3.1 mmol/L — ABNORMAL LOW (ref 3.5–5.1)
Sodium: 136 mmol/L (ref 135–145)
Total Bilirubin: 0.8 mg/dL (ref 0.3–1.2)
Total Protein: 7.7 g/dL (ref 6.5–8.1)

## 2020-11-16 LAB — IRON AND TIBC
Iron: 37 ug/dL — ABNORMAL LOW (ref 45–182)
Saturation Ratios: 13 % — ABNORMAL LOW (ref 17.9–39.5)
TIBC: 275 ug/dL (ref 250–450)
UIBC: 238 ug/dL

## 2020-11-16 LAB — LACTATE DEHYDROGENASE: LDH: 130 U/L (ref 98–192)

## 2020-11-16 LAB — FERRITIN: Ferritin: 171 ng/mL (ref 24–336)

## 2020-11-16 NOTE — Progress Notes (Signed)
[name], [name] [address]   CLINIC:  Medical Oncology/Hematology  PCP:  Celene Squibb, MD [place] [address] [phone]   REASON FOR VISIT:  Follow-up for MGUS and anemia of CKD  CURRENT THERAPY: Retacrit 8000 units every 2 weeks  INTERVAL HISTORY:  D[name] 65 y.o. male returns for routine follow-up of his CKD-related anemia and MGUS.  He was last seen by Dr. Delton Coombes on 09/13/2020.  Patient was initially referred from Dr. Theador Hawthorne due to CKD anemia that was not improving despite getting Retacrit 8000 units every 2 weeks.  Work-up for other causes of anemia shows underlying MGUS, with serum IFE showing IgG lambda monoclonal gammopathy, although no M spike was found on SPEP.  Lambda free light chains elevated at 163.5, kappa light chains elevated at 186.6 with normal light chain ratio.  B12 deficiency was evidenced by elevated methylmalonic acid 721.    At today's visit, patient reports feeling fairly well.  He was recently hospitalized for lower extremity cellulitis, but has been recovering at the [facility].  He reports that he is regaining his strength and hopes to be discharged to his home in the next week.  He has not noticed any recent bleeding such as epistaxis, hematuria, hematochezia, or melena.  He does note a scant aount of blood on the tissue paper when he wipes after a constipated bowel movement, but he denies any gross rectal hemorrhage or melena. He denies recent chest pain on exertion, shortness of breath on minimal exertion, pre-syncopal episodes, or palpitations. He denies any pica and eats a variety of diet. The patient denies over the counter NSAID ingestion. He is not taking on antiplatelets agents.  He does not take any PPI or acid-blocking medications.  He was recently started on Eliquis during recent hospitalization due to new onset atrial fibrillation. The patient was prescribed oral iron  supplements and he takes two 325 mg tablets daily (started at SNF on 11/06/2020), was also started on B12 1 mg daily (11/07/2020 at SNF).  He denies any new bone pain or recent fractures. He denies any B symptoms.   No new neurologic symptoms such as tinnitus, new-onset hearing loss, blurred vision, headache, or dizziness.  He has chronic numbness or tingling in hands or feet secondary to diabetic neuropathy. No history thromboembolic events. No new masses or lymphadenopathy per his report.   He has not yet seen his nephrologist after his recent hospitalization and acute kidney injury.  He reports that his last dose of Retacrit was in April 2022 due to transportation issues and hospitalizations.  He has 75% energy and 100% appetite. He endorses that he is maintaining a stable weight.   REVIEW OF SYSTEMS:  Review of Systems  Constitutional: Positive for fatigue (75%). Negative for appetite change, chills, diaphoresis, fever and unexpected weight change.  HENT:   Negative for lump/mass and nosebleeds.   Eyes: Negative for eye problems.  Respiratory: Negative for cough, hemoptysis and shortness of breath.   Cardiovascular: Negative for chest pain, leg swelling and palpitations.  Gastrointestinal: Positive for constipation. Negative for abdominal pain, blood in stool, diarrhea, nausea and vomiting.  Genitourinary: Negative for hematuria.   Skin: Negative.   Neurological: Negative for dizziness, headaches and light-headedness.  Hematological: Does not bruise/bleed easily.      PAST MEDICAL/SURGICAL HISTORY:  Past Medical History:  Diagnosis Date  . Anemia   . Arthritis   . CKD (chronic  kidney disease), stage IV ([place])   . Diabetes mellitus without complication ([place])   . Foot ulcer ([place])   . Hypertension   . Urinary retention    Past Surgical History:  Procedure Laterality Date  . ANKLE SURGERY Right   . CHOLECYSTECTOMY    . FOOT SURGERY Right   . TRANSURETHRAL RESECTION OF PROSTATE  N/A 01/15/2020   Procedure: TRANSURETHRAL RESECTION OF THE PROSTATE (TURP)  with General anesthesia and spinal;  Surgeon: Cleon Gustin, MD;  Location: [facility];  Service: Urology;  Laterality: N/A;     SOCIAL HISTORY:  Social History   Socioeconomic History  . Marital status: Divorced    Spouse name: Not on file  . Number of children: Not on file  . Years of education: Not on file  . Highest education level: Not on file  Occupational History  . Not on file  Tobacco Use  . Smoking status: Never Smoker  . Smokeless tobacco: Never Used  Vaping Use  . Vaping Use: Never used  Substance and Sexual Activity  . Alcohol use: No  . Drug use: No  . Sexual activity: Not Currently  Other Topics Concern  . Not on file  Social History Narrative  . Not on file   Social Determinants of Health   Financial Resource Strain: Low Risk   . Difficulty of Paying Living Expenses: Not very hard  Food Insecurity: No Food Insecurity  . Worried About Charity fundraiser in the Last Year: Never true  . Ran Out of Food in the Last Year: Never true  Transportation Needs: No Transportation Needs  . Lack of Transportation (Medical): No  . Lack of Transportation (Non-Medical): No  Physical Activity: Inactive  . Days of Exercise per Week: 0 days  . Minutes of Exercise per Session: 0 min  Stress: No Stress Concern Present  . Feeling of Stress : Only a little  Social Connections: Not on file  Intimate Partner Violence: Not At Risk  . Fear of Current or Ex-Partner: No  . Emotionally Abused: No  . Physically Abused: No  . Sexually Abused: No    FAMILY HISTORY:  Family History  Problem Relation Age of Onset  . Diabetes Mother   . Heart attack Mother   . Heart attack Father   . Diabetes Brother     CURRENT MEDICATIONS:  Outpatient Encounter Medications as of 11/16/2020  Medication Sig  . acetaminophen (TYLENOL) 500 MG tablet Take 1,000 mg by mouth every 6 (six) hours as needed for moderate  pain or headache.  . Ascorbic Acid (VITAMIN C) 500 MG CHEW Chew 1,000 mg by mouth daily.  Marland Kitchen aspirin 325 MG tablet Take 325 mg by mouth daily.  . cholecalciferol (VITAMIN D3) 25 MCG (1000 UNIT) tablet Take 1,000 Units by mouth daily.  . collagenase (SANTYL) ointment Apply to right lateral malleolus once daily; apply in a 1/8 inch layer and top with saline moistened gauze 2X2.  . [EXPIRED] doxycycline (VIBRAMYCIN) 100 MG capsule Take 1 capsule (100 mg total) by mouth 2 (two) times daily for 10 days.  Marland Kitchen epoetin alfa-epbx (RETACRIT) 64158 UNIT/ML injection 8,000 Units every 14 (fourteen) days.  . ferrous sulfate 325 (65 FE) MG tablet Take 325 mg by mouth 2 (two) times daily with a meal.  . hydrALAZINE (APRESOLINE) 100 MG tablet Take 1 tablet (100 mg total) by mouth 3 (three) times daily.  . hydrocerin (EUCERIN) CREA Apply to bilateral lower extremities after washing with soap and  water, rinsing and patting dry daily.  . Lactulose 20 GM/30ML SOLN Take 30 mLs (20 g total) by mouth daily.  . metoprolol succinate (TOPROL-XL) 25 MG 24 hr tablet Take 1 tablet (25 mg total) by mouth every evening.  . Multiple Vitamin (MULTIVITAMIN WITH MINERALS) TABS tablet Take 1 tablet by mouth daily.  Marland Kitchen NOVOLIN 70/30 RELION (70-30) 100 UNIT/ML injection Inject 30 Units into the skin in the morning and at bedtime.  . Nutritional Supplements (FEEDING SUPPLEMENT, NEPRO CARB STEADY,) LIQD Take 237 mLs by mouth 2 (two) times daily between meals.  . Omega-3 Fatty Acids (FISH OIL PO) Take 2,400 mg by mouth 2 (two) times daily.  . pravastatin (PRAVACHOL) 80 MG tablet Take 1 tablet (80 mg total) by mouth every evening.  . sevelamer carbonate (RENVELA) 800 MG tablet Take 1 tablet (800 mg total) by mouth 3 (three) times daily with meals.  . torsemide (DEMADEX) 20 MG tablet Take 2.5 tablets (50 mg total) by mouth 2 (two) times daily.  . vitamin B-12 (CYANOCOBALAMIN) 1000 MCG tablet Take 1,000 mcg by mouth daily.  . vitamin E 1000  UNIT capsule Take 1,000 Units by mouth daily.  . Zinc 30 MG CAPS Take 1 capsule by mouth daily.   No facility-administered encounter medications on file as of 11/16/2020.    ALLERGIES:  Allergies  Allergen Reactions  . Dust Mite Extract Itching and Other (See Comments)    Unknown reaction-potential shortness of breath  . Prednisone Nausea And Vomiting  . Rocephin [Ceftriaxone] Nausea And Vomiting     PHYSICAL EXAM:  ECOG PERFORMANCE STATUS: 2 - Symptomatic, <50% confined to bed  There were no vitals filed for this visit. There were no vitals filed for this visit. Physical Exam Constitutional:      Appearance: Normal appearance. He is obese.     Comments: Presents in wheelchair  HENT:     Head: Normocephalic and atraumatic.     Mouth/Throat:     Mouth: Mucous membranes are moist.  Eyes:     Extraocular Movements: Extraocular movements intact.     Pupils: Pupils are equal, round, and reactive to light.  Cardiovascular:     Rate and Rhythm: Normal rate. Rhythm irregularly irregular.     Pulses: Normal pulses.     Heart sounds: Normal heart sounds.  Pulmonary:     Effort: Pulmonary effort is normal.     Breath sounds: Normal breath sounds.  Abdominal:     General: Bowel sounds are normal.     Palpations: Abdomen is soft.     Tenderness: There is no abdominal tenderness.  Musculoskeletal:        General: No swelling.     Right lower leg: No edema.     Left lower leg: No edema.  Lymphadenopathy:     Cervical: No cervical adenopathy.  Skin:    General: Skin is warm and dry.  Neurological:     General: No focal deficit present.     Mental Status: He is alert and oriented to person, place, and time.  Psychiatric:        Mood and Affect: Mood normal.        Behavior: Behavior normal.      LABORATORY DATA:  I have reviewed the labs as listed.  CBC    Component Value Date/Time   WBC 5.5 11/02/2020 0643   RBC 3.08 (L) 11/02/2020 0643   HGB 8.4 (L) 11/02/2020 0643    HCT 25.6 (L) 11/02/2020 3570  PLT 206 11/02/2020 0643   MCV 83.1 11/02/2020 0643   MCH 27.3 11/02/2020 0643   MCHC 32.8 11/02/2020 0643   RDW 16.9 (H) 11/02/2020 0643   LYMPHSABS 0.6 (L) 10/25/2020 1327   MONOABS 0.5 10/25/2020 1327   EOSABS 0.3 10/25/2020 1327   BASOSABS 0.0 10/25/2020 1327   CMP Latest Ref Rng & Units 11/05/2020 11/04/2020 11/03/2020  Glucose 70 - 99 mg/dL 91 114(H) 88  BUN 8 - 23 mg/dL 83(H) 85(H) 80(H)  Creatinine 0.61 - 1.24 mg/dL 5.20(H) 5.50(H) 5.20(H)  Sodium 135 - 145 mmol/L 135 133(L) 135  Potassium 3.5 - 5.1 mmol/L 3.6 3.8 4.1  Chloride 98 - 111 mmol/L 95(L) 100 102  CO2 22 - 32 mmol/L _0 Calcium 8.9 - 10.3 mg/dL 8.8(L) 8.6(L) 8.8(L)  Total Protein 6.5 - 8.1 g/dL - - -  Total Bilirubin 0.3 - 1.2 mg/dL - - -  Alkaline Phos 38 - 126 U/L - - -  AST 15 - 41 U/L - - -  ALT 0 - 44 U/L - - -    DIAGNOSTIC IMAGING:  I have independently reviewed the relevant imaging and discussed with the patient.   ASSESSMENT: 1.  Normocytic anemia - Multifactorial etiology related to CKD stage IV and relative iron deficiency - Has been receiving Retacrit via nephrologist, initially receiving 4,000 units every 2 weeks started 10/10/2019, dose increased to 8,000 units every 2 weeks on 08/17/2020 - Hgb remains < 9.0 despite Retacrit - Was started on Eliquis during recent hospitalization due to new onset atrial fibrillation - denies gross rectal hemorrhage or melena - Labs today (11/16/2020) showed slightly improved Hgb 9.1 with MCV 84.5; creatinine 4.66 with GFR 13 - Relative iron deficiency (11/16/2020) with normal ferritin 171 and low serum iron 37, low iron saturation 13%; suspect ferritin falsely elevated due to CKD and chronic disease - No prior history of transfusion.  Has never had an EGD or colonoscopy. - Negative work-up for hemolysis (normal LDH, haptoglobin, reticulocyte count, direct [place]) - Nutritional deficiency work-up showed B12 deficiency (see below),  but normal folic acid and copper  - Recently started on iron by SNF - taking 325 mg daily since 11/06/2020  2.  Monoclonal gammopathy of unknown significance, under work-up - Work-up for other causes of anemia above revealed immunofixation (09/13/2020) with IgG lambda monoclonal protein; no M spike evident on SPEP; elevated lambda light chains 163.5, elevated kappa light chains 186.6, normal light chain ratio 1.14  3.  B12 deficiency - Evidenced by elevated methylmalonic acid 721, although B12 elevated at 1355 - Recently started on B12 by SNF - taking 1 mg daily cyanocobalamin since 11/07/2020  4.  CKD: -Stage IV, since 2013, secondary to diabetes. -Renal ultrasound on 07/08/2019 shows increased renal cortical echogenicity bilaterally, suggestive of chronic renal parenchymal disease.  2.5 cm simple right renal cyst with no hydronephrosis. - Acute kidney injury during her most recent hospital stay with creatinine up to 5.50  5.  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  Quit smoking in 1988. -Sister had cancer, patient does not know the type.   PLAN:  1.  Normocytic anemia - Patient encouraged to complete previously ordered stool cards x3 - Labs today (11/16/2020) showed slightly improved Hgb 9.1 with MCV 84.5; creatinine 4.66 with GFR 13 - Relative iron deficiency (11/16/2020) with normal ferritin 171 and low serum iron 37, low iron saturation 13%; suspect ferritin falsely elevated due to CKD and chronic disease -  We will coordinate with nephrologist (Dr. Theador Hawthorne) for ongoing Retacrit injections -patient can be slowly titrated up to maximum dose of 12,000 units/week for goal Hgb > 10.0 - If Hgb remains low and refractory to treatment, will consider proceeding with bone marrow biopsy - Continue trial of oral iron at SNF - will check ferritin and iron panel in 3 months and consider IV iron if numbers are not improved or if patient is unable to tolerate oral iron  supplement  2.  Monoclonal gammopathy of unknown significance, under work-up - We will check 24-hour urine with total protein, UPEP, and urine IFE - Check skeletal survey  3.  B12 deficiency - Continue trial of oral B12 at SNF - Will recheck B12 and methylmalonic acid in 3 months and consider B12 injections if levels are not improved  4.  CKD stage IV - Patient encouraged to follow-up with nephrologist as soon as possible due to acute kidney injury versus progression of chronic kidney disease during most recent hospital stay   PLAN SUMMARY & DISPOSITION:  - Check stool cards and 24-hour urine - Obtain skeletal survey - Repeat CBC and follow-up in 2 weeks to discuss results and next steps - We will coordinate ongoing Retacrit injections with nephrology office  All questions were answered. The patient knows to call the clinic with any problems, questions or concerns.  Medical decision making: Moderate (2+ chronic problems not at goal and under work-up, review of prior tests, ordering new tests)  Time spent on visit: I spent 30 minutes counseling the patient face to face. The total time spent in the appointment was 40 minutes and more than 50% was on counseling.   [name], PA-C  11/16/20 8:47 AM

## 2020-11-16 NOTE — Patient Instructions (Addendum)
[place] at [facility] Discharge Instructions  You were seen today by Tarri Abernethy PA-C for your anemia and abnormal protein (MGUS).    LABS: Complete stool cards x 3 PLUS 24-hour urine study and return them to the [facility] as soon as possible.   OTHER TESTS: Skeletal survey (whole body Xray) today  MEDICATIONS: Continue ferrous sulfate (iron) and cyanocobalamin (B12) supplements.  FOLLOW-UP APPOINTMENT: Phone visit next week to discuss results and next steps.   Thank you for choosing [place] at [facility] to provide your oncology and hematology care.  To afford each patient quality time with our provider, please arrive at least 15 minutes before your scheduled appointment time.   If you have a lab appointment with the [facility] [place] please come in thru the Main Entrance and check in at the main information desk.  You need to re-schedule your appointment should you arrive 10 or more minutes late.  We strive to give you quality time with our providers, and arriving late affects you and other patients whose appointments are after yours.  Also, if you no show three or more times for appointments you may be dismissed from the clinic at the providers discretion.     Again, thank you for choosing [facility].  Our hope is that these requests will decrease the amount of time that you wait before being seen by our physicians.       _____________________________________________________________  Should you have questions after your visit to [facility], please contact our office at [phone] and follow the prompts.  Our office hours are 8:00 a.m. and 4:30 p.m. Monday - Friday.  Please note that voicemails left after 4:00 p.m. may not be returned until the following business day.  We are closed weekends and major holidays.  You do have access to a nurse 24-7, just call the main number to the clinic  [phone] and do not press any options, hold on the line and a nurse will answer the phone.    For prescription refill requests, have your pharmacy contact our office and allow 72 hours.    Due to Covid, you will need to wear a mask upon entering the hospital. If you do not have a mask, a mask will be given to you at the Main Entrance upon arrival. For doctor visits, patients may have 1 support person age 67 or older with them. For treatment visits, patients can not have anyone with them due to social distancing guidelines and our immunocompromised population.

## 2020-11-17 DIAGNOSIS — E669 Obesity, unspecified: Secondary | ICD-10-CM | POA: Diagnosis not present

## 2020-11-17 DIAGNOSIS — M6281 Muscle weakness (generalized): Secondary | ICD-10-CM | POA: Diagnosis not present

## 2020-11-17 DIAGNOSIS — N184 Chronic kidney disease, stage 4 (severe): Secondary | ICD-10-CM | POA: Diagnosis not present

## 2020-11-17 DIAGNOSIS — L89513 Pressure ulcer of right ankle, stage 3: Secondary | ICD-10-CM | POA: Diagnosis not present

## 2020-11-19 DIAGNOSIS — I10 Essential (primary) hypertension: Secondary | ICD-10-CM | POA: Diagnosis not present

## 2020-11-19 DIAGNOSIS — D649 Anemia, unspecified: Secondary | ICD-10-CM | POA: Diagnosis not present

## 2020-11-19 DIAGNOSIS — I5030 Unspecified diastolic (congestive) heart failure: Secondary | ICD-10-CM | POA: Diagnosis not present

## 2020-11-19 DIAGNOSIS — E1169 Type 2 diabetes mellitus with other specified complication: Secondary | ICD-10-CM | POA: Diagnosis not present

## 2020-11-19 DIAGNOSIS — E539 Vitamin B deficiency, unspecified: Secondary | ICD-10-CM | POA: Diagnosis not present

## 2020-11-19 DIAGNOSIS — I509 Heart failure, unspecified: Secondary | ICD-10-CM | POA: Diagnosis not present

## 2020-11-19 DIAGNOSIS — E785 Hyperlipidemia, unspecified: Secondary | ICD-10-CM | POA: Diagnosis not present

## 2020-11-19 DIAGNOSIS — Z794 Long term (current) use of insulin: Secondary | ICD-10-CM | POA: Diagnosis not present

## 2020-11-24 LAB — FUNGUS CULTURE WITH STAIN

## 2020-11-24 LAB — FUNGUS CULTURE RESULT

## 2020-11-24 LAB — FUNGAL ORGANISM REFLEX

## 2020-11-25 ENCOUNTER — Telehealth (HOSPITAL_COMMUNITY): Payer: Medicare HMO | Admitting: Physician Assistant

## 2020-11-28 DIAGNOSIS — R531 Weakness: Secondary | ICD-10-CM | POA: Diagnosis not present

## 2020-11-28 DIAGNOSIS — W19XXXA Unspecified fall, initial encounter: Secondary | ICD-10-CM | POA: Diagnosis not present

## 2020-11-29 ENCOUNTER — Emergency Department (HOSPITAL_COMMUNITY): Payer: Medicare HMO

## 2020-11-29 ENCOUNTER — Encounter (HOSPITAL_COMMUNITY): Payer: Self-pay

## 2020-11-29 ENCOUNTER — Emergency Department (HOSPITAL_COMMUNITY)
Admission: EM | Admit: 2020-11-29 | Discharge: 2020-11-30 | Disposition: A | Discharge status: Home / Self Care | Payer: Medicare HMO | Attending: Emergency Medicine | Admitting: Emergency Medicine

## 2020-11-29 ENCOUNTER — Other Ambulatory Visit: Payer: Self-pay

## 2020-11-29 DIAGNOSIS — E1121 Type 2 diabetes mellitus with diabetic nephropathy: Secondary | ICD-10-CM | POA: Diagnosis not present

## 2020-11-29 DIAGNOSIS — Z794 Long term (current) use of insulin: Secondary | ICD-10-CM | POA: Diagnosis not present

## 2020-11-29 DIAGNOSIS — R42 Dizziness and giddiness: Secondary | ICD-10-CM | POA: Diagnosis present

## 2020-11-29 DIAGNOSIS — Z79899 Other long term (current) drug therapy: Secondary | ICD-10-CM | POA: Insufficient documentation

## 2020-11-29 DIAGNOSIS — J9811 Atelectasis: Secondary | ICD-10-CM | POA: Diagnosis not present

## 2020-11-29 DIAGNOSIS — R531 Weakness: Secondary | ICD-10-CM

## 2020-11-29 DIAGNOSIS — Z20822 Contact with and (suspected) exposure to covid-19: Secondary | ICD-10-CM | POA: Diagnosis not present

## 2020-11-29 DIAGNOSIS — I130 Hypertensive heart and chronic kidney disease with heart failure and stage 1 through stage 4 chronic kidney disease, or unspecified chronic kidney disease: Secondary | ICD-10-CM | POA: Diagnosis not present

## 2020-11-29 DIAGNOSIS — I517 Cardiomegaly: Secondary | ICD-10-CM | POA: Diagnosis not present

## 2020-11-29 DIAGNOSIS — N184 Chronic kidney disease, stage 4 (severe): Secondary | ICD-10-CM | POA: Diagnosis not present

## 2020-11-29 DIAGNOSIS — R609 Edema, unspecified: Secondary | ICD-10-CM | POA: Insufficient documentation

## 2020-11-29 DIAGNOSIS — I5033 Acute on chronic diastolic (congestive) heart failure: Secondary | ICD-10-CM | POA: Diagnosis not present

## 2020-11-29 DIAGNOSIS — E1122 Type 2 diabetes mellitus with diabetic chronic kidney disease: Secondary | ICD-10-CM | POA: Diagnosis not present

## 2020-11-29 DIAGNOSIS — Z87891 Personal history of nicotine dependence: Secondary | ICD-10-CM | POA: Insufficient documentation

## 2020-11-29 DIAGNOSIS — Z743 Need for continuous supervision: Secondary | ICD-10-CM | POA: Diagnosis not present

## 2020-11-29 DIAGNOSIS — Z043 Encounter for examination and observation following other accident: Secondary | ICD-10-CM | POA: Diagnosis not present

## 2020-11-29 DIAGNOSIS — I10 Essential (primary) hypertension: Secondary | ICD-10-CM | POA: Diagnosis not present

## 2020-11-29 DIAGNOSIS — M4186 Other forms of scoliosis, lumbar region: Secondary | ICD-10-CM | POA: Diagnosis not present

## 2020-11-29 DIAGNOSIS — Z7982 Long term (current) use of aspirin: Secondary | ICD-10-CM | POA: Insufficient documentation

## 2020-11-29 DIAGNOSIS — M47816 Spondylosis without myelopathy or radiculopathy, lumbar region: Secondary | ICD-10-CM | POA: Diagnosis not present

## 2020-11-29 LAB — CBC WITH DIFFERENTIAL/PLATELET
Abs Immature Granulocytes: 0.06 10*3/uL (ref 0.00–0.07)
Basophils Absolute: 0.1 10*3/uL (ref 0.0–0.1)
Basophils Relative: 1 %
Eosinophils Absolute: 0.3 10*3/uL (ref 0.0–0.5)
Eosinophils Relative: 3 %
HCT: 25.9 % — ABNORMAL LOW (ref 39.0–52.0)
Hemoglobin: 8.5 g/dL — ABNORMAL LOW (ref 13.0–17.0)
Immature Granulocytes: 1 %
Lymphocytes Relative: 9 %
Lymphs Abs: 0.7 10*3/uL (ref 0.7–4.0)
MCH: 27.3 pg (ref 26.0–34.0)
MCHC: 32.8 g/dL (ref 30.0–36.0)
MCV: 83.3 fL (ref 80.0–100.0)
Monocytes Absolute: 0.5 10*3/uL (ref 0.1–1.0)
Monocytes Relative: 7 %
Neutro Abs: 6.4 10*3/uL (ref 1.7–7.7)
Neutrophils Relative %: 79 %
Platelets: 171 10*3/uL (ref 150–400)
RBC: 3.11 MIL/uL — ABNORMAL LOW (ref 4.22–5.81)
RDW: 16.9 % — ABNORMAL HIGH (ref 11.5–15.5)
WBC: 8 10*3/uL (ref 4.0–10.5)
nRBC: 0 % (ref 0.0–0.2)

## 2020-11-29 LAB — URINALYSIS, ROUTINE W REFLEX MICROSCOPIC
Bacteria, UA: NONE SEEN
Bilirubin Urine: NEGATIVE
Glucose, UA: >=500 mg/dL — AB
Hgb urine dipstick: NEGATIVE
Ketones, ur: NEGATIVE mg/dL
Leukocytes,Ua: NEGATIVE
Nitrite: NEGATIVE
Protein, ur: 100 mg/dL — AB
Specific Gravity, Urine: 1.008 (ref 1.005–1.030)
pH: 7 (ref 5.0–8.0)

## 2020-11-29 LAB — LIPASE, BLOOD: Lipase: 57 U/L — ABNORMAL HIGH (ref 11–51)

## 2020-11-29 LAB — COMPREHENSIVE METABOLIC PANEL
ALT: 23 U/L (ref 0–44)
AST: 27 U/L (ref 15–41)
Albumin: 2.8 g/dL — ABNORMAL LOW (ref 3.5–5.0)
Alkaline Phosphatase: 115 U/L (ref 38–126)
Anion gap: 8 (ref 5–15)
BUN: 84 mg/dL — ABNORMAL HIGH (ref 8–23)
CO2: 25 mmol/L (ref 22–32)
Calcium: 8.2 mg/dL — ABNORMAL LOW (ref 8.9–10.3)
Chloride: 100 mmol/L (ref 98–111)
Creatinine, Ser: 5.06 mg/dL — ABNORMAL HIGH (ref 0.61–1.24)
GFR, Estimated: 12 mL/min — ABNORMAL LOW (ref >60)
Glucose, Bld: 238 mg/dL — ABNORMAL HIGH (ref 70–99)
Potassium: 4.4 mmol/L (ref 3.5–5.1)
Sodium: 133 mmol/L — ABNORMAL LOW (ref 135–145)
Total Bilirubin: 0.8 mg/dL (ref 0.3–1.2)
Total Protein: 7.8 g/dL (ref 6.5–8.1)

## 2020-11-29 LAB — LACTIC ACID, PLASMA
Lactic Acid, Venous: 1.6 mmol/L (ref 0.5–1.9)
Lactic Acid, Venous: 1.2 mmol/L (ref 0.5–1.9)

## 2020-11-29 LAB — BRAIN NATRIURETIC PEPTIDE: B Natriuretic Peptide: 308 pg/mL — ABNORMAL HIGH (ref 0.0–100.0)

## 2020-11-29 MED ORDER — INSULIN ASPART PROT & ASPART (70-30 MIX) 100 UNIT/ML ~~LOC~~ SUSP
30.0000 [IU] | Freq: Two times a day (BID) | SUBCUTANEOUS | Status: DC
  Administered 2020-11-30: 30 [IU] via SUBCUTANEOUS
  Filled 2020-11-29: qty 10

## 2020-11-29 MED ORDER — ACETAMINOPHEN 500 MG PO TABS: 1000.0000 mg | ORAL_TABLET | Freq: Four times a day (QID) | ORAL | Status: DC | PRN

## 2020-11-29 MED ORDER — METOPROLOL SUCCINATE ER 25 MG PO TB24
25.0000 mg | ORAL_TABLET | Freq: Every evening | ORAL | Status: DC
  Administered 2020-11-29: 25 mg via ORAL
  Filled 2020-11-29: qty 1

## 2020-11-29 MED ORDER — VITAMIN D 25 MCG (1000 UNIT) PO TABS
1000.0000 [IU] | ORAL_TABLET | Freq: Every day | ORAL | Status: DC
  Administered 2020-11-29 – 2020-11-30 (×2): 1000 [IU] via ORAL
  Filled 2020-11-29 (×2): qty 1

## 2020-11-29 MED ORDER — VITAMIN B-12 1000 MCG PO TABS
1000.0000 ug | ORAL_TABLET | Freq: Every day | ORAL | Status: DC
  Administered 2020-11-29 – 2020-11-30 (×2): 1000 ug via ORAL
  Filled 2020-11-29 (×2): qty 1

## 2020-11-29 MED ORDER — ASPIRIN 325 MG PO TABS
325.0000 mg | ORAL_TABLET | Freq: Every day | ORAL | Status: DC
  Administered 2020-11-29 – 2020-11-30 (×2): 325 mg via ORAL
  Filled 2020-11-29 (×2): qty 1

## 2020-11-29 MED ORDER — HYDRALAZINE HCL 25 MG PO TABS
ORAL_TABLET | ORAL | Status: AC
  Filled 2020-11-29: qty 4

## 2020-11-29 MED ORDER — HYDRALAZINE HCL 25 MG PO TABS
100.0000 mg | ORAL_TABLET | Freq: Three times a day (TID) | ORAL | Status: DC
  Administered 2020-11-29 – 2020-11-30 (×2): 100 mg via ORAL
  Filled 2020-11-29 (×2): qty 2
  Filled 2020-11-29: qty 4
  Filled 2020-11-29 (×3): qty 2

## 2020-11-29 MED ORDER — SODIUM CHLORIDE 0.9 % IV BOLUS
1000.0000 mL | Freq: Once | INTRAVENOUS | Status: AC
  Administered 2020-11-29: 1000 mL via INTRAVENOUS

## 2020-11-29 MED ORDER — PRAVASTATIN SODIUM 40 MG PO TABS
80.0000 mg | ORAL_TABLET | Freq: Every evening | ORAL | Status: DC
  Administered 2020-11-29: 80 mg via ORAL
  Filled 2020-11-29: qty 2

## 2020-11-29 MED ORDER — TORSEMIDE 20 MG PO TABS
50.0000 mg | ORAL_TABLET | Freq: Two times a day (BID) | ORAL | Status: DC
  Administered 2020-11-29 – 2020-11-30 (×2): 50 mg via ORAL
  Filled 2020-11-29 (×2): qty 3

## 2020-11-29 MED ORDER — VITAMIN C 500 MG PO CHEW: 1000.0000 mg | CHEWABLE_TABLET | Freq: Every day | ORAL | Status: DC

## 2020-11-29 NOTE — ED Provider Notes (Signed)
[facility] EMERGENCY DEPARTMENT Provider Note   CSN: [id number] Arrival date & time: 11/29/20  1738     History Chief Complaint  Patient presents with  . Weakness    Danny [name] is a 65 y.o. male.  HPI Patient with multiple medical issues including CKD, swelling, obesity presents with concern for falls, weakness.  Weakness is generalized.  He notes that he was hospitalized about a month ago, spent 2 weeks in rehab, and now has been attempting to function at home for the past 10 days.  Over the past 2 days in particular the patient has had generalized weakness, has had falls, with inability to get himself from the floor, requiring EMS notification twice.  During the most recent fall he fell on his backside, did not strike his head, had no loss of consciousness, but since that time he has had pain in his buttocks.  No weakness in his lower extremities.  He does note that he has increasing swelling in both legs, with ongoing weeping.  No fever.    Past Medical History:  Diagnosis Date  . Anemia   . Arthritis   . CKD (chronic kidney disease), stage IV ([place])   . Diabetes mellitus without complication ([place])   . Foot ulcer ([place])   . Hypertension   . Urinary retention     Patient Active Problem List   Diagnosis Date Noted  . Osteomyelitis of right ankle ([place])   . Cellulitis, leg 10/28/2020  . CKD (chronic kidney disease) stage 4, GFR 15-29 ml/min (HCC) 10/28/2020  . Moderate protein-calorie malnutrition ([place]) 10/26/2020  . Medical non-compliance 10/26/2020  . Chronic ulcer of right ankle ([place])   . Venous stasis   . Cellulitis 10/25/2020  . Normocytic anemia 09/13/2020  . Class 2 obesity   . Respiratory failure ([place]) 03/23/2020  . UTI (urinary tract infection), bacterial 01/22/2020  . Acute on chronic anemia 01/16/2020  . Benign prostatic hyperplasia with urinary obstruction 01/15/2020  . AKI (acute kidney injury) ([place]) 07/08/2019  . Hypoxia 07/08/2019  . Pressure injury of  skin 07/08/2019  . Edema of both lower extremities   . Anasarca 03/23/2019  . Bladder outlet obstruction 03/23/2019  . Yeast infection of the skin 03/16/2019  . Diabetic ulcer of left foot ([place]) 03/15/2019  . Diabetic ulcer of ankle ([place]) 03/15/2019  . Hypertension associated with stage 3 chronic kidney disease due to type 2 diabetes mellitus ([place]) 03/09/2019  . Controlled type 2 diabetes mellitus with stage 3 chronic kidney disease, with long-term current use of insulin ([place]) 03/09/2019  . Type 2 diabetes with nephropathy ([place]) 03/09/2019  . Chronic gout due to renal impairment without tophus 03/09/2019  . Emphysematous cystitis 03/05/2019  . Bilateral hydronephrosis   . Bilateral cellulitis of lower leg 03/03/2019  . Urinary retention 03/03/2019  . Klebsiella Cystitis 03/03/2019  . UTI due to Klebsiella species 03/03/2019  . Plantar ulcer of left foot ([place]) 03/03/2019  . Acute on chronic diastolic (congestive) heart failure ([place]) 02/25/2019  . Acute respiratory failure with hypoxia ([place]) 02/25/2019  . Acute renal failure superimposed on stage 4 chronic kidney disease ([place]) 02/24/2019  . Congestive heart failure ([place]) 02/24/2019  . Dyspnea 02/23/2019  . Essential hypertension 02/23/2019  . Diabetes mellitus ([place]) 02/23/2019  . CKD (chronic kidney disease) 02/23/2019  . Psoriasis 02/23/2019    Past Surgical History:  Procedure Laterality Date  . ANKLE SURGERY Right   . CHOLECYSTECTOMY    . FOOT SURGERY Right   .  TRANSURETHRAL RESECTION OF PROSTATE N/A 01/15/2020   Procedure: TRANSURETHRAL RESECTION OF THE PROSTATE (TURP)  with General anesthesia and spinal;  Surgeon: Cleon Gustin, MD;  Location: [facility];  Service: Urology;  Laterality: N/A;       Family History  Problem Relation Age of Onset  . Diabetes Mother   . Heart attack Mother   . Heart attack Father   . Diabetes Brother     Social History   Tobacco Use  . Smoking status: Former Research scientist (life sciences)  . Smokeless  tobacco: Never Used  Vaping Use  . Vaping Use: Never used  Substance Use Topics  . Alcohol use: Not Currently  . Drug use: Never    Home Medications Prior to Admission medications   Medication Sig Start Date End Date Taking? Authorizing Provider  acetaminophen (TYLENOL) 500 MG tablet Take 1,000 mg by mouth every 6 (six) hours as needed for moderate pain or headache.   Yes [provider]  Ascorbic Acid (VITAMIN C) 500 MG CHEW Chew 1,000 mg by mouth daily.   Yes [provider]  aspirin 325 MG tablet Take 325 mg by mouth daily.   Yes [provider]  cholecalciferol (VITAMIN D3) 25 MCG (1000 UNIT) tablet Take 1,000 Units by mouth daily.   Yes [provider]  collagenase (SANTYL) ointment Apply to right lateral malleolus once daily; apply in a 1/8 inch layer and top with saline moistened gauze 2X2. 11/05/20  Yes Barton Dubois, MD  hydrALAZINE (APRESOLINE) 100 MG tablet Take 1 tablet (100 mg total) by mouth 3 (three) times daily. 11/05/20 11/05/21 Yes Barton Dubois, MD  metoprolol succinate (TOPROL-XL) 25 MG 24 hr tablet Take 1 tablet (25 mg total) by mouth every evening. 11/05/20  Yes Barton Dubois, MD  Multiple Vitamin (MULTIVITAMIN WITH MINERALS) TABS tablet Take 1 tablet by mouth daily.   Yes [provider]  NOVOLIN 70/30 RELION (70-30) 100 UNIT/ML injection Inject 30 Units into the skin in the morning and at bedtime. 11/05/20  Yes Barton Dubois, MD  Nutritional Supplements (FEEDING SUPPLEMENT, NEPRO CARB STEADY,) LIQD Take 237 mLs by mouth 2 (two) times daily between meals. 11/05/20  Yes Barton Dubois, MD  Omega-3 Fatty Acids (FISH OIL PO) Take 2,400 mg by mouth 2 (two) times daily.   Yes [provider]  pravastatin (PRAVACHOL) 80 MG tablet Take 1 tablet (80 mg total) by mouth every evening. 03/20/19  Yes Gerlene Fee, NP  torsemide (DEMADEX) 20 MG tablet Take 2.5 tablets (50 mg total) by mouth 2 (two) times daily. Patient taking  differently: Take 60 mg by mouth daily. 11/05/20  Yes Barton Dubois, MD  vitamin B-12 (CYANOCOBALAMIN) 1000 MCG tablet Take 1,000 mcg by mouth daily.   Yes [provider]  vitamin E 1000 UNIT capsule Take 1,000 Units by mouth daily.   Yes [provider]  Zinc 30 MG CAPS Take 1 capsule by mouth daily.   Yes [provider]  epoetin alfa-epbx (RETACRIT) 83382 UNIT/ML injection 8,000 Units every 14 (fourteen) days. Patient not taking: Reported on 11/29/2020 08/17/20   Liana Gerold, MD  ferrous sulfate 325 (65 FE) MG tablet Take 325 mg by mouth 2 (two) times daily with a meal. Patient not taking: Reported on 11/29/2020    [provider]  Lactulose 20 GM/30ML SOLN Take 30 mLs (20 g total) by mouth daily. Patient not taking: No sig reported 11/11/20   [name], MD  sevelamer carbonate (RENVELA) 800 MG tablet  Take 1 tablet (800 mg total) by mouth 3 (three) times daily with meals. Patient not taking: No sig reported 11/05/20   Barton Dubois, MD    Allergies    Dust mite extract, Prednisone, and Rocephin [ceftriaxone]  Review of Systems   Review of Systems  Constitutional:       Per HPI, otherwise negative  HENT:       Per HPI, otherwise negative  Respiratory:       Per HPI, otherwise negative  Cardiovascular:       Per HPI, otherwise negative  Gastrointestinal: Negative for vomiting.  Endocrine:       Negative aside from HPI  Genitourinary:       Neg aside from HPI   Musculoskeletal:       Per HPI, otherwise negative  Skin: Positive for color change and wound.  Neurological: Positive for weakness. Negative for syncope.    Physical Exam Updated Vital Signs BP (!) 128/56   Pulse 82   Temp 98.3 F (36.8 C) (Oral)   Resp 15   Ht 6\' 1"  (1.854 m)   Wt 122.5 kg   SpO2 94%   BMI 35.62 kg/m   Physical Exam Vitals and nursing note reviewed.  Constitutional:      General: He is not in acute distress.    Appearance: He is  well-developed. He is obese. He is ill-appearing. He is not toxic-appearing.  HENT:     Head: Normocephalic and atraumatic.  Eyes:     Conjunctiva/sclera: Conjunctivae normal.  Cardiovascular:     Rate and Rhythm: Normal rate and regular rhythm.  Pulmonary:     Effort: Pulmonary effort is normal. No respiratory distress.     Breath sounds: No stridor.  Abdominal:     General: There is no distension.  Musculoskeletal:     Right lower leg: Edema present.     Left lower leg: Edema present.  Skin:    General: Skin is warm and dry.     Comments: Weeping edema both lower extremities, no substantial erythema.  Neurological:     Mental Status: He is alert and oriented to person, place, and time.  Psychiatric:        Mood and Affect: Mood normal.     ED Results / Procedures / Treatments   Labs (all labs ordered are listed, but only abnormal results are displayed) Labs Reviewed  COMPREHENSIVE METABOLIC PANEL - Abnormal; Notable for the following components:      Result Value   Sodium 133 (*)    Glucose, Bld 238 (*)    BUN 84 (*)    Creatinine, Ser 5.06 (*)    Calcium 8.2 (*)    Albumin 2.8 (*)    GFR, Estimated 12 (*)    All other components within normal limits  LIPASE, BLOOD - Abnormal; Notable for the following components:   Lipase 57 (*)    All other components within normal limits  BRAIN NATRIURETIC PEPTIDE - Abnormal; Notable for the following components:   B Natriuretic Peptide 308.0 (*)    All other components within normal limits  CBC WITH DIFFERENTIAL/PLATELET - Abnormal; Notable for the following components:   RBC 3.11 (*)    Hemoglobin 8.5 (*)    HCT 25.9 (*)    RDW 16.9 (*)    All other components within normal limits  SARS CORONAVIRUS 2 (TAT 6-24 HRS)  LACTIC ACID, PLASMA  LACTIC ACID, PLASMA  URINALYSIS, ROUTINE W REFLEX MICROSCOPIC  EKG None  Radiology No results found.  Procedures Procedures   Medications Ordered in ED Medications   acetaminophen (TYLENOL) tablet 1,000 mg (has no administration in time range)  aspirin tablet 325 mg (has no administration in time range)  cholecalciferol (VITAMIN D3) tablet 1,000 Units (has no administration in time range)  hydrALAZINE (APRESOLINE) tablet 100 mg (has no administration in time range)  metoprolol succinate (TOPROL-XL) 24 hr tablet 25 mg (has no administration in time range)  insulin aspart protamine- aspart (NOVOLOG MIX 70/30) injection 30 Units (has no administration in time range)  pravastatin (PRAVACHOL) tablet 80 mg (has no administration in time range)  torsemide (DEMADEX) tablet 50 mg (has no administration in time range)  vitamin B-12 (CYANOCOBALAMIN) tablet 1,000 mcg (has no administration in time range)  sodium chloride 0.9 % bolus 1,000 mL (1,000 mLs Intravenous New Bag/Given 11/29/20 1835)    ED Course  I have reviewed the triage vital signs and the nursing notes.  Pertinent labs & imaging results that were available during my care of the patient were reviewed by me and considered in my medical decision making (see chart for details).  Obese elderly male with multiple medical issues including CKD, chronic bilateral lower extremity edema presents with weakness sufficient to cause him to not be able to give self off the floor after multiple falls in the past 2 days.  Patient is awake and alert, does move all extremity spontaneously, has no evidence for bacteremia, sepsis.  He does have evidence for worsening chronic kidney disease, fluid retention, but no evidence for acute, rather gradual progression of his processes. No negation for hospital admission, but given the patient's statement that he cannot care for himself at home a social work consult has been placed, home meds have been ordered, and the patient will be awaiting assistance from social work colleagues for disposition. Final Clinical Impression(s) / ED Diagnoses Final diagnoses:  Weakness      Carmin Muskrat, MD 11/29/20 1934

## 2020-11-29 NOTE — ED Notes (Signed)
Pt verbalized he uses a wheelchair at home and that is what he is falling from.

## 2020-11-29 NOTE — ED Triage Notes (Signed)
Pt stated he has been having increased weakness for weeks. He fell x 2 yesterday and called EMS and had to crawl to door. Bilateral legs have abrasions from this per pt. Pt fell again at midnight onto his right knee. The only pain pt c/o is buttock pain. Old wound noted to right ankle dressed in soiled dressing. Redness and edema noted to bilateral legs. Pt lives alone

## 2020-11-30 DIAGNOSIS — R531 Weakness: Secondary | ICD-10-CM | POA: Diagnosis not present

## 2020-11-30 DIAGNOSIS — Z743 Need for continuous supervision: Secondary | ICD-10-CM | POA: Diagnosis not present

## 2020-11-30 LAB — SARS CORONAVIRUS 2 (TAT 6-24 HRS): SARS Coronavirus 2: NEGATIVE

## 2020-11-30 LAB — CBG MONITORING, ED: Glucose-Capillary: 157 mg/dL — ABNORMAL HIGH (ref 70–99)

## 2020-11-30 NOTE — ED Notes (Signed)
Pt soiled bed. Pt cleaned and new brief applied. Pt food tray given. Stretcher locked and lowest position. Call bell in reach.

## 2020-11-30 NOTE — Progress Notes (Signed)
    Durable Medical Equipment  (From admission, onward)         Start     Ordered   11/30/20 0000  For home use only DME Hospital bed       Question Answer Comment  Length of Need Lifetime   The above medical condition requires: Patient requires the ability to reposition frequently   Bed type Semi-electric   Trapeze Bar Yes      11/30/20 1340

## 2020-11-30 NOTE — Discharge Instructions (Signed)
It was our pleasure to provide your ER care today - we hope that you feel better.  We have made home health referral - they should be contacting you and starting services very soon. Discuss with your doctor and home health agency whether need for aide, nurse, and home health social worker (if future SNF placement is desired).  You may also consider other home health services such as [facility] for additional help and support.   Follow up with primary care doctor in one week.  Return to ER if worse, new symptoms, fevers, new or severe pain, trouble breathing, fainting, or other concern.

## 2020-11-30 NOTE — ED Provider Notes (Addendum)
Emergency Medicine Observation Re-evaluation Note  Jaelan Rasheed is a 65 y.o. male, seen on rounds today.  Pt initially presented to the ED for complaints of general weakness, deconditioning. Pt s/p brief SNF/rehab stay, and difficulty managing at home.  TOC evaluation is pending.   Physical Exam  BP (!) 164/87   Pulse 85   Temp 98.3 F (36.8 C) (Oral)   Resp 15   Ht 1.854 m (6\' 1" )   Wt 122.5 kg   SpO2 99%   BMI 35.62 kg/m  Physical Exam General: awake, nad.  Cardiac: regular rate Lungs: breathing comfortably   ED Course / MDM  EKG:   I have reviewed the labs performed to date as well as medications administered while in observation.  Recent changes in the last 24 hours include TOC consultation.   Plan  Current plan is for [facility] evaluation - ?SNF/rehab vs HH.     Lajean Saver, MD 11/30/20 0830   TOC has reassessed, spoken with patient, and indicates currently plan is for home health services, OT and PT. States HH, pt and pcp will decide on expansion of those services if need.    Pt currently appears stable for d/c.      Lajean Saver, MD 11/30/20 1254

## 2020-11-30 NOTE — ED Notes (Signed)
[facility]) has been called to scheduled transport back home.

## 2020-11-30 NOTE — Evaluation (Signed)
Physical Therapy Evaluation Patient Details Name: Danny [name] MRN: [id number] DOB: 1955-07-06 Today's Date: 11/30/2020   History of Present Illness  Danny [name] is a 65 y.o. male presents with generalized weakness, recent falls at home. Xray: no evidence of an acute lumbar spine fractures. PMH: HTN, diabetes, CKD, anemia, TURP 12/2019, CHF  Clinical Impression  Pt admitted with above diagnosis. Pt with recent SNF stay, reports very productive and able to d/c home, but became progressively weaker and feels like it is related to hemoglobin and B12. Pt also states recent falls due to personal errors, leaning on w/c causing it to slide backward and forgetting to don socks prior to transfer causing him to slide. Pt currently requiring min A to laterally scoot up to [facility], min-mod A with bed mobility, and unable to perform STS from elevated bed with max A +1. Pt in agreement with SNF for strengthening prior to return home. Pt with sister and brother in law able to assist if needed intermittently. Pt currently with functional limitations due to the deficits listed below (see PT Problem List). Pt will benefit from skilled PT to increase their independence and safety with mobility to allow discharge to the venue listed below.       Follow Up Recommendations SNF    Equipment Recommendations  None recommended by PT    Recommendations for Other Services       Precautions / Restrictions Precautions Precautions: Fall Restrictions Weight Bearing Restrictions: No      Mobility  Bed Mobility Overal bed mobility: Needs Assistance Bed Mobility: Supine to Sit;Sit to Supine  Supine to sit: Min assist Sit to supine: Mod assist   General bed mobility comments: pt pulling on PT's hand to pull trunk to uprighting and scoot out to EOB; mod A to lift BLE back into bed    Transfers Overall transfer level: Needs assistance   Transfers: Lateral/Scoot Transfers   Lateral/Scoot Transfers: Min assist General  transfer comment: unable to perform STS with max A +1 despite multiple attempts; pt laterally scoots up to [facility] with therapist blockign BLE and providing min A to scoot  Ambulation/Gait  General Gait Details: using w/c at baseline  Stairs            Wheelchair Mobility    Modified Rankin (Stroke Patients Only)       Balance Overall balance assessment: Needs assistance;History of Falls Sitting-balance support: Feet supported Sitting balance-Leahy Scale: Fair Sitting balance - Comments: seated EOB        Pertinent Vitals/Pain Pain Assessment: Faces Faces Pain Scale: Hurts little more Pain Location: generalized with movement Pain Intervention(s): Limited activity within patient's tolerance;Monitored during session    [place] expects to be discharged to:: Skilled nursing facility  Additional Comments: Pt reports recently home from SNF, progressively weakening resulting in falls at home. Pt has sister at home to assist as needed.    Prior Function Level of Independence: Needs assistance               Hand Dominance   Dominant Hand: Right    Extremity/Trunk Assessment   Upper Extremity Assessment Upper Extremity Assessment: Overall WFL for tasks assessed    Lower Extremity Assessment Lower Extremity Assessment: Generalized weakness (AROM WNL, strength 3/5, bil LE wounds noted to anterior shin and ankle)    Cervical / Trunk Assessment Cervical / Trunk Assessment: Kyphotic  Communication   Communication: No difficulties  Cognition Arousal/Alertness: Awake/alert Behavior During Therapy: WFL for tasks assessed/performed Overall  Cognitive Status: Within Functional Limits for tasks assessed     General Comments      Exercises     Assessment/Plan    PT Assessment Patient needs continued PT services  PT Problem List Decreased strength;Decreased activity tolerance;Decreased balance;Decreased mobility;Decreased knowledge of use of  DME;Decreased safety awareness;Obesity;Decreased skin integrity;Pain       PT Treatment Interventions DME instruction;Gait training;Functional mobility training;Therapeutic activities;Therapeutic exercise;Balance training;Neuromuscular re-education;Patient/family education;Wheelchair mobility training    PT Goals (Current goals can be found in the Care Plan section)  Acute Rehab PT Goals Patient Stated Goal: agreeable to SNF PT Goal Formulation: With patient Time For Goal Achievement: 12/14/20 Potential to Achieve Goals: Good    Frequency Min 2X/week   Barriers to discharge        Co-evaluation               AM-PAC PT "6 Clicks" Mobility  Outcome Measure Help needed turning from your back to your side while in a flat bed without using bedrails?: A Little Help needed moving from lying on your back to sitting on the side of a flat bed without using bedrails?: A Little Help needed moving to and from a bed to a chair (including a wheelchair)?: Total Help needed standing up from a chair using your arms (e.g., wheelchair or bedside chair)?: Total Help needed to walk in hospital room?: Total Help needed climbing 3-5 steps with a railing? : Total 6 Click Score: 10    End of Session Equipment Utilized During Treatment: Gait belt Activity Tolerance: Patient tolerated treatment well Patient left: in bed;with call bell/phone within reach Nurse Communication: Mobility status PT Visit Diagnosis: Unsteadiness on feet (R26.81);Other abnormalities of gait and mobility (R26.89);Muscle weakness (generalized) (M62.81);History of falling (Z91.81)    Time: 1000-1038 PT Time Calculation (min) (ACUTE ONLY): 38 min   Charges:   PT Evaluation $PT Eval Moderate Complexity: 1 Mod PT Treatments $Therapeutic Activity: 8-22 mins         Tori Stormie Ventola PT, DPT 11/30/20, 11:04 AM

## 2020-11-30 NOTE — Plan of Care (Signed)
  Problem: Acute Rehab PT Goals(only PT should resolve) Goal: Pt Will Go Supine/Side To Sit Outcome: Progressing Flowsheets (Taken 11/30/2020 1108) Pt will go Supine/Side to Sit: with min guard assist Goal: Pt Will Go Sit To Supine/Side Outcome: Progressing Flowsheets (Taken 11/30/2020 1108) Pt will go Sit to Supine/Side: with min guard assist Goal: Patient Will Transfer Sit To/From Stand Outcome: Progressing Flowsheets (Taken 11/30/2020 1108) Patient will transfer sit to/from stand: with moderate assist Goal: Pt Will Transfer Bed To Chair/Chair To Bed Outcome: Progressing Flowsheets (Taken 11/30/2020 1108) Pt will Transfer Bed to Chair/Chair to Bed: with mod assist Goal: Pt/caregiver will Perform Home Exercise Program Outcome: Progressing Flowsheets (Taken 11/30/2020 1108) Pt/caregiver will Perform Home Exercise Program:  For increased ROM  For increased strengthening  For improved balance  Independently   Danny [name] PT, DPT 11/30/20, 11:08 AM

## 2020-11-30 NOTE — Progress Notes (Addendum)
CSW spoke with pt about his interest in SNF. Pt stats that he does not feel as if he would be able to participate in therapy because of his weakness and back pain. CSW explained that if pt does not participate in therapy he will be discharged from the facility because his insurance will not cover SNF if he is not doing his therapies. CSW confirmed that pt only used 14 of his SNF days. CSW explained to pt that only alternate plan would be home with [facility] services. Pt states that he is agreeable to going home with [facility] services. CSW spoke to Sarah with [facility] who accepts referral for [facility] PT and OT. CSW also provided pt with resources on how to contact companies and search for a lift recliner as we cannot set that up for pt from the hospital. Resources were placed in pts bag as requested by pt in room. HH orders have been placed.   CSW spoke with pts sister about pt. CSW explained plan and that it would be beneficial for family and pt to look into speaking with the department of social services to work on a long term plan for pt. Pts sister is thankful and understanding of this information. CSW also explained that long term placement is done from the community and not from the hospital. CSW also explained that it would be beneficial to look into applying pt for Medicaid as he does not currently have a payor source for long term care.   CSW to send pts referral to the financial counselor to see if they can review pts chart and if he could be eligible for any assistance programs. TOC signing off.   CSW updated that pt is requesting a hospital bed for home. CSW asked that MD put in orders. CSW spoke to [place] with [employer] who will look over and accept referral. TOC signing off.

## 2020-12-01 DIAGNOSIS — M869 Osteomyelitis, unspecified: Secondary | ICD-10-CM | POA: Diagnosis not present

## 2020-12-01 DIAGNOSIS — E11621 Type 2 diabetes mellitus with foot ulcer: Secondary | ICD-10-CM | POA: Diagnosis not present

## 2020-12-02 ENCOUNTER — Ambulatory Visit (HOSPITAL_COMMUNITY): Payer: Medicare HMO | Admitting: Physician Assistant

## 2020-12-02 ENCOUNTER — Inpatient Hospital Stay (HOSPITAL_COMMUNITY): Payer: Medicare HMO

## 2020-12-02 ENCOUNTER — Other Ambulatory Visit (HOSPITAL_COMMUNITY): Payer: Medicare HMO

## 2020-12-02 DIAGNOSIS — N184 Chronic kidney disease, stage 4 (severe): Secondary | ICD-10-CM | POA: Diagnosis not present

## 2020-12-02 DIAGNOSIS — I130 Hypertensive heart and chronic kidney disease with heart failure and stage 1 through stage 4 chronic kidney disease, or unspecified chronic kidney disease: Secondary | ICD-10-CM | POA: Diagnosis not present

## 2020-12-02 DIAGNOSIS — I872 Venous insufficiency (chronic) (peripheral): Secondary | ICD-10-CM | POA: Diagnosis not present

## 2020-12-02 DIAGNOSIS — E1122 Type 2 diabetes mellitus with diabetic chronic kidney disease: Secondary | ICD-10-CM | POA: Diagnosis not present

## 2020-12-02 DIAGNOSIS — L89312 Pressure ulcer of right buttock, stage 2: Secondary | ICD-10-CM | POA: Diagnosis not present

## 2020-12-02 DIAGNOSIS — L97312 Non-pressure chronic ulcer of right ankle with fat layer exposed: Secondary | ICD-10-CM | POA: Diagnosis not present

## 2020-12-02 DIAGNOSIS — E1151 Type 2 diabetes mellitus with diabetic peripheral angiopathy without gangrene: Secondary | ICD-10-CM | POA: Diagnosis not present

## 2020-12-02 DIAGNOSIS — N401 Enlarged prostate with lower urinary tract symptoms: Secondary | ICD-10-CM | POA: Diagnosis not present

## 2020-12-02 DIAGNOSIS — I5032 Chronic diastolic (congestive) heart failure: Secondary | ICD-10-CM | POA: Diagnosis not present

## 2020-12-02 DIAGNOSIS — E11622 Type 2 diabetes mellitus with other skin ulcer: Secondary | ICD-10-CM | POA: Diagnosis not present

## 2020-12-03 ENCOUNTER — Other Ambulatory Visit (HOSPITAL_COMMUNITY): Payer: Medicare HMO

## 2020-12-03 ENCOUNTER — Ambulatory Visit (HOSPITAL_COMMUNITY): Payer: Medicare HMO | Admitting: Physician Assistant

## 2020-12-03 DIAGNOSIS — N184 Chronic kidney disease, stage 4 (severe): Secondary | ICD-10-CM | POA: Diagnosis not present

## 2020-12-03 DIAGNOSIS — E1122 Type 2 diabetes mellitus with diabetic chronic kidney disease: Secondary | ICD-10-CM | POA: Diagnosis not present

## 2020-12-03 DIAGNOSIS — L97312 Non-pressure chronic ulcer of right ankle with fat layer exposed: Secondary | ICD-10-CM | POA: Diagnosis not present

## 2020-12-03 DIAGNOSIS — I872 Venous insufficiency (chronic) (peripheral): Secondary | ICD-10-CM | POA: Diagnosis not present

## 2020-12-03 DIAGNOSIS — E1151 Type 2 diabetes mellitus with diabetic peripheral angiopathy without gangrene: Secondary | ICD-10-CM | POA: Diagnosis not present

## 2020-12-03 DIAGNOSIS — I130 Hypertensive heart and chronic kidney disease with heart failure and stage 1 through stage 4 chronic kidney disease, or unspecified chronic kidney disease: Secondary | ICD-10-CM | POA: Diagnosis not present

## 2020-12-03 DIAGNOSIS — I5032 Chronic diastolic (congestive) heart failure: Secondary | ICD-10-CM | POA: Diagnosis not present

## 2020-12-03 DIAGNOSIS — E11622 Type 2 diabetes mellitus with other skin ulcer: Secondary | ICD-10-CM | POA: Diagnosis not present

## 2020-12-03 DIAGNOSIS — L89312 Pressure ulcer of right buttock, stage 2: Secondary | ICD-10-CM | POA: Diagnosis not present

## 2020-12-03 DIAGNOSIS — N401 Enlarged prostate with lower urinary tract symptoms: Secondary | ICD-10-CM | POA: Diagnosis not present

## 2020-12-05 NOTE — Progress Notes (Addendum)
[facility] [place], [place] [address]   CLINIC:  Medical Oncology/Hematology  PCP:  Celene Squibb, MD [place] [address] [phone]   REASON FOR VISIT:  Follow-up for MGUS and anemia of CKD  CURRENT THERAPY: Retacrit 8000 units every 2 weeks  INTERVAL HISTORY:  Danny [name] 65 y.o. male returns for routine follow-up of his CKD-related anemia and MGUS.  He was last seen by Tarri Abernethy PA-C on 11/16/2020.  Since his last visit, the patient reports feeling somewhat poor.  He was discharged from SNF but did have an emergency department visit on 11/29/2020 due to generalized weakness.  He was discharged home with plans for further home health and evaluation with his PCP.  He continues to have severe fatigue at today's visit.  He is nearly unable to stand and is having difficulties completing his ADLs.  He has become wheelchair dependent since leaving SNF.  He denies any new bone pain or recent fractures. He denies any B symptoms, nausea, vomiting, or diarrhea.  No new neurologic symptoms such as tinnitus, new-onset hearing loss, blurred vision, headache, or dizziness.  Denies any new numbness or tingling in hands or feet, but admits to chronic neuropathy related to his diabetes.  He stopped taking Eliquis for his Afib, reports that somebody told him to take his aspirin instead.  He has not noticed any recent bleeding such as epistaxis, hematuria, hematemesis, melena, or hematochezia.  Denies recent chest pain on exertion, shortness of breath on minimal exertion, pre-syncopal episodes, or palpitations.  No new masses or lymphadenopathy per his report.  Patient reports appetite at 100% and energy level at 10%.  He is maintaining stable weight at this time.  He is no longer taking ferrous sulfate twice daily and vitamin B12 1 mg daily.  Danny [name] is noted to have a continued elevation in his creatinine, most recent creatinine 5.06  (11/29/2020).  However, he has not yet followed up with nephrology since March 2022.  He was strongly encouraged at our last visit to make an appointment with his nephrologist, but this has apparently not yet happened.  I have reiterated the importance of this at today's visit. He also has not seen his PCP since hat visit.  He has 10% energy and 100% appetite. He endorses that he is maintaining a stable weight.    REVIEW OF SYSTEMS:  Review of Systems  Constitutional:  Positive for fatigue (severe). Negative for appetite change, chills, diaphoresis, fever and unexpected weight change.  HENT:   Negative for lump/mass and nosebleeds.   Eyes:  Negative for eye problems.  Respiratory:  Negative for cough, hemoptysis and shortness of breath.   Cardiovascular:  Positive for leg swelling. Negative for chest pain and palpitations.  Gastrointestinal:  Positive for constipation. Negative for abdominal pain, blood in stool, diarrhea, nausea and vomiting.  Genitourinary:  Positive for frequency (taking diuretics). Negative for hematuria.   Skin: Negative.   Neurological:  Positive for headaches (occasional) and numbness (chronic diabetic peripheral neuropathy). Negative for dizziness and light-headedness.  Hematological:  Does not bruise/bleed easily.  Psychiatric/Behavioral:  Positive for sleep disturbance.      PAST MEDICAL/SURGICAL HISTORY:  Past Medical History:  Diagnosis Date   Anemia    Arthritis    CKD (chronic kidney disease), stage IV (HCC)    Diabetes mellitus without complication ([place])    Foot ulcer ([place])    Hypertension    Urinary retention  Past Surgical History:  Procedure Laterality Date   ANKLE SURGERY Right    CHOLECYSTECTOMY     FOOT SURGERY Right    TRANSURETHRAL RESECTION OF PROSTATE N/A 01/15/2020   Procedure: TRANSURETHRAL RESECTION OF THE PROSTATE (TURP)  with General anesthesia and spinal;  Surgeon: Cleon Gustin, MD;  Location: [facility];  Service: Urology;   Laterality: N/A;     SOCIAL HISTORY:  Social History   Socioeconomic History   Marital status: Divorced    Spouse name: Not on file   Number of children: Not on file   Years of education: Not on file   Highest education level: Not on file  Occupational History   Not on file  Tobacco Use   Smoking status: Former    Pack years: 0.00   Smokeless tobacco: Never  Vaping Use   Vaping Use: Never used  Substance and Sexual Activity   Alcohol use: Not Currently   Drug use: Never   Sexual activity: Not Currently  Other Topics Concern   Not on file  Social History Narrative   Not on file   Social Determinants of Health   Financial Resource Strain: Low Risk    Difficulty of Paying Living Expenses: Not very hard  Food Insecurity: No Food Insecurity   Worried About Running Out of Food in the Last Year: Never true   [place] in the Last Year: Never true  Transportation Needs: No Transportation Needs   Lack of Transportation (Medical): No   Lack of Transportation (Non-Medical): No  Physical Activity: Inactive   Days of Exercise per Week: 0 days   Minutes of Exercise per Session: 0 min  Stress: No Stress Concern Present   Feeling of Stress : Only a little  Social Connections: Not on file  Intimate Partner Violence: Not At Risk   Fear of Current or Ex-Partner: No   Emotionally Abused: No   Physically Abused: No   Sexually Abused: No    FAMILY HISTORY:  Family History  Problem Relation Age of Onset   Diabetes Mother    Heart attack Mother    Heart attack Father    Diabetes Brother     CURRENT MEDICATIONS:  Outpatient Encounter Medications as of 12/06/2020  Medication Sig Note   acetaminophen (TYLENOL) 500 MG tablet Take 1,000 mg by mouth every 6 (six) hours as needed for moderate pain or headache.    Ascorbic Acid (VITAMIN C) 500 MG CHEW Chew 1,000 mg by mouth daily.    aspirin 325 MG tablet Take 325 mg by mouth daily.    cholecalciferol (VITAMIN D3) 25 MCG  (1000 UNIT) tablet Take 1,000 Units by mouth daily.    collagenase (SANTYL) ointment Apply to right lateral malleolus once daily; apply in a 1/8 inch layer and top with saline moistened gauze 2X2.    epoetin alfa-epbx (RETACRIT) 24580 UNIT/ML injection 8,000 Units every 14 (fourteen) days. (Patient not taking: Reported on 11/29/2020) 11/29/2020: Pt could not continue due to not being able to come to recieve   ferrous sulfate 325 (65 FE) MG tablet Take 325 mg by mouth 2 (two) times daily with a meal. (Patient not taking: Reported on 11/29/2020)    hydrALAZINE (APRESOLINE) 100 MG tablet Take 1 tablet (100 mg total) by mouth 3 (three) times daily.    Lactulose 20 GM/30ML SOLN Take 30 mLs (20 g total) by mouth daily. (Patient not taking: No sig reported)    metoprolol succinate (TOPROL-XL) 25 MG 24  hr tablet Take 1 tablet (25 mg total) by mouth every evening.    Multiple Vitamin (MULTIVITAMIN WITH MINERALS) TABS tablet Take 1 tablet by mouth daily.    NOVOLIN 70/30 RELION (70-30) 100 UNIT/ML injection Inject 30 Units into the skin in the morning and at bedtime.    Nutritional Supplements (FEEDING SUPPLEMENT, NEPRO CARB STEADY,) LIQD Take 237 mLs by mouth 2 (two) times daily between meals.    Omega-3 Fatty Acids (FISH OIL PO) Take 2,400 mg by mouth 2 (two) times daily.    pravastatin (PRAVACHOL) 80 MG tablet Take 1 tablet (80 mg total) by mouth every evening.    sevelamer carbonate (RENVELA) 800 MG tablet Take 1 tablet (800 mg total) by mouth 3 (three) times daily with meals. (Patient not taking: No sig reported)    torsemide (DEMADEX) 20 MG tablet Take 2.5 tablets (50 mg total) by mouth 2 (two) times daily. (Patient taking differently: Take 60 mg by mouth daily.) 11/29/2020: Pt states he is suppose to take 125m but just picked it up so he took 60 mg yesturday.   vitamin B-12 (CYANOCOBALAMIN) 1000 MCG tablet Take 1,000 mcg by mouth daily.    vitamin E 1000 UNIT capsule Take 1,000 Units by mouth daily.    Zinc 30  MG CAPS Take 1 capsule by mouth daily.    No facility-administered encounter medications on file as of 12/06/2020.    ALLERGIES:  Allergies  Allergen Reactions   Dust Mite Extract Itching and Other (See Comments)    Unknown reaction-potential shortness of breath   Prednisone Nausea And Vomiting   Rocephin [Ceftriaxone] Nausea And Vomiting     PHYSICAL EXAM:  ECOG PERFORMANCE STATUS: 3 - Symptomatic, >50% confined to bed Pale, weak, W/C, irreg, left leg red/edema  There were no vitals filed for this visit. There were no vitals filed for this visit. Physical Exam Constitutional:      Appearance: Normal appearance. He is obese.     Comments: Weak-appearing; presents in wheelchair  HENT:     Head: Normocephalic and atraumatic.     Mouth/Throat:     Mouth: Mucous membranes are moist.  Eyes:     Extraocular Movements: Extraocular movements intact.     Pupils: Pupils are equal, round, and reactive to light.     Comments: Conjunctival palor  Cardiovascular:     Rate and Rhythm: Normal rate. Rhythm irregularly irregular.     Pulses: Normal pulses.     Heart sounds: Normal heart sounds.  Pulmonary:     Effort: Pulmonary effort is normal.     Breath sounds: Normal breath sounds.     Comments: Dry cough Abdominal:     General: Bowel sounds are normal.     Palpations: Abdomen is soft.     Tenderness: There is no abdominal tenderness.  Musculoskeletal:        General: No swelling.     Right lower leg: Edema present.     Left lower leg: Edema present.  Lymphadenopathy:     Cervical: No cervical adenopathy.  Skin:    General: Skin is warm and dry.     Coloration: Skin is pale.     Findings: Erythema (left leg) present.  Neurological:     General: No focal deficit present.     Mental Status: He is alert and oriented to person, place, and time.  Psychiatric:        Mood and Affect: Mood normal.  Behavior: Behavior normal.     LABORATORY DATA:  I have reviewed the  labs as listed.  CBC    Component Value Date/Time   WBC 8.0 11/29/2020 1840   RBC 3.11 (L) 11/29/2020 1840   HGB 8.5 (L) 11/29/2020 1840   HCT 25.9 (L) 11/29/2020 1840   PLT 171 11/29/2020 1840   MCV 83.3 11/29/2020 1840   MCH 27.3 11/29/2020 1840   MCHC 32.8 11/29/2020 1840   RDW 16.9 (H) 11/29/2020 1840   LYMPHSABS 0.7 11/29/2020 1840   MONOABS 0.5 11/29/2020 1840   EOSABS 0.3 11/29/2020 1840   BASOSABS 0.1 11/29/2020 1840   CMP Latest Ref Rng & Units 11/29/2020 11/16/2020 11/05/2020  Glucose 70 - 99 mg/dL 238(H) 171(H) 91  BUN 8 - 23 mg/dL 84(H) 72(H) 83(H)  Creatinine 0.61 - 1.24 mg/dL 5.06(H) 4.66(H) 5.20(H)  Sodium 135 - 145 mmol/L 133(L) 136 135  Potassium 3.5 - 5.1 mmol/L 4.4 3.1(L) 3.6  Chloride 98 - 111 mmol/L 100 100 95(L)  CO2 22 - 32 mmol/L _0 Calcium 8.9 - 10.3 mg/dL 8.2(L) 8.9 8.8(L)  Total Protein 6.5 - 8.1 g/dL 7.8 7.7 -  Total Bilirubin 0.3 - 1.2 mg/dL 0.8 0.8 -  Alkaline Phos 38 - 126 U/L 115 107 -  AST 15 - 41 U/L 27 24 -  ALT 0 - 44 U/L 23 12 -    DIAGNOSTIC IMAGING:  I have independently reviewed the relevant imaging and discussed with the patient.  ASSESSMENT: 1.  Normocytic anemia - Multifactorial etiology related to CKD stage IV and relative iron deficiency - Has been receiving Retacrit via nephrologist, initially receiving 4,000 units every 2 weeks started 10/10/2019, dose increased to 8,000 units every 2 weeks on 08/17/2020 - Hgb remains < 9.0 despite Retacrit - Was started on Eliquis during recent hospitalization due to new onset atrial fibrillation, but is now only taking aspirin 81 mg - denies gross rectal hemorrhage or melena - Relative iron deficiency (11/16/2020) with normal ferritin 171 and low serum iron 37, low iron saturation 13%; suspect ferritin falsely elevated due to CKD and chronic disease - No prior history of transfusion.  Has never had an EGD or colonoscopy. - Negative work-up for hemolysis (normal LDH, haptoglobin,  reticulocyte count, direct [place]) - Nutritional deficiency work-up showed B12 deficiency (see below), but normal folic acid and copper - Patient stopped taking oral iron supplement due to constipation - Stool cards x3 were negative for occult blood - CBC today (12/06/2020) shows Hgb 8.0/MCV 84.3   2.  Monoclonal gammopathy of unknown significance, under work-up - Work-up for other causes of anemia above revealed immunofixation (09/13/2020) with IgG lambda monoclonal protein; no M spike evident on SPEP; elevated lambda light chains 163.5, elevated kappa light chains 186.6, normal light chain ratio 1.14 - Skeletal survey (11/16/2020): No definite lytic destruction or lucency noted in the visualized skeleton - No definite CRAB features: Patient does have a markedly elevated creatinine due to longstanding CKD and poorly controlled diabetes/hypertension, Hgb < 10.0 thought to be due to anemia of CKD - 24-hour urine ordered with UPEP and IFE is pending   3.  B12 deficiency - Evidenced by elevated methylmalonic acid 721, although B12 elevated at 1355 (likely reactive) - He was taking B12 pills at home, but there were not helping   4.  CKD stage IV/V, possible progression to ESRD -Stage IV, since 2013, secondary to diabetes. -Renal ultrasound on 07/08/2019 shows increased renal cortical echogenicity bilaterally, suggestive  of chronic renal parenchymal disease.  2.5 cm simple right renal cyst with no hydronephrosis. - Acute kidney injury during most recent hospital stay with creatinine up to 5.50 - Most recent creatinine 5.06 (11/29/2020) - Patient has not seen nephrologist since March 2022  5. Extreme fatigue   -Suspect multifactorial etiology related to CKD 5/ESRD, anemia, physical deconditioning, B12 deficiency, and multiple other chronic comorbidities - Patient does not want to go to ER, states that "last time they did not do anything except check labs and discharge me more money" - Says he will "try  to manage" with ADLs at home - Has home health services set up at home   PLAN:  1.  Normocytic anemia - Patient encouraged to complete previously ordered stool cards x3 - Labs today show Hgb 8.0 - I discussed with his nephrologist (Dr. Theador Hawthorne), and we will take over Retacrit injection here at the hematology clinic - will start with Retacrit 10,000 units weekly (can be slowly titrated up to reach goal Hgb > 10.0)  - If Hgb remains low and refractory to treatment, will consider proceeding with bone marrow biopsy - Will check ferritin and iron panel in 3 months and consider IV iron if numbers are not improved or if patient is unable to tolerate oral iron supplement   2.  Monoclonal gammopathy of unknown significance, under work-up - 24-hour urine ordered at last appointment is currently pending - No definite CRAB features: Patient does have a markedly elevated creatinine due to longstanding CKD and poorly controlled diabetes/hypertension, Hgb < 10.0 thought to be due to anemia of CKD  - Consider bone marrow biopsy if patient has significant urine M spike or if lambda free light chains continue to increase; may also consider bone marrow biopsy if Hgb does not improve with increased dose of Retacrit  - Repeat MGUS panel in 3 months   3.  B12 deficiency - Patient did not feel the B12 pills were helping - We will start patient on monthly B12 injections due to severe fatigue and laboratory evidence of B12 deficiency (i.e. elevated methylmalonic acid) - Will recheck B12 and methylmalonic acid in 3 months and consider B12 injections if levels are not improved   4.  CKD stage IV/V, possible progression to ESRD - Patient encouraged to follow-up with nephrologist ASAP due to acute kidney injury versus progression of chronic kidney disease to ESRD during most recent hospital stay   5.  Social history: Patient seems to have difficulty following up with appropriate specialists and keeping up with his high  burden of chronic disease.  I will reach out to our case manager to see if she has any recommendations or resources to assist.  I have also reiterated to the patient the EXTREME IMPORTANCE of following up with his PCP and nephrologist ASAP   PLAN SUMMARY & DISPOSITION: -Weekly Retacrit 10,000 units (with weekly CBC) - Monthly B12 injection - Repeat lab panel in 4 weeks - RTC in 4 weeks - Referral to case management  All questions were answered. The patient knows to call the clinic with any problems, questions or concerns.  Medical decision making: Moderate  Time spent on visit: I spent 30 minutes counseling the patient face to face. The total time spent in the appointment was 40 minutes and more than 50% was on counseling.   [name], PA-C  12/06/20 5:17 PM   ADDENDUM: Stool cards x3 were previously reported as "negative," but corrected results were updated by the  lab on 12/07/2020, showing stool occult blood x3 were POSITIVE FOR OCCULT BLOOD.  In light of this finding, there is suspicion for possible occult GI bleed.  Patient has already stopped Eliquis, as above, but remains on aspirin.  Patient will be referred to gastroenterology for hopeful EGD/colonoscopy to investigate possible gastrointestinal sources of blood loss.  Additionally, we will check CBC, ferritin and iron panel at the patient's lab draw this Friday.  Patient will be called with these updated results and changes in his treatment plan.

## 2020-12-06 ENCOUNTER — Other Ambulatory Visit: Payer: Self-pay

## 2020-12-06 ENCOUNTER — Inpatient Hospital Stay (HOSPITAL_COMMUNITY): Payer: Medicare HMO | Attending: Hematology

## 2020-12-06 ENCOUNTER — Inpatient Hospital Stay (HOSPITAL_BASED_OUTPATIENT_CLINIC_OR_DEPARTMENT_OTHER): Payer: Medicare HMO | Admitting: Physician Assistant

## 2020-12-06 VITALS — BP 123/66 | HR 93 | Temp 96.9°F | Resp 18

## 2020-12-06 DIAGNOSIS — D472 Monoclonal gammopathy: Secondary | ICD-10-CM | POA: Diagnosis not present

## 2020-12-06 DIAGNOSIS — I4891 Unspecified atrial fibrillation: Secondary | ICD-10-CM | POA: Insufficient documentation

## 2020-12-06 DIAGNOSIS — D508 Other iron deficiency anemias: Secondary | ICD-10-CM

## 2020-12-06 DIAGNOSIS — R5383 Other fatigue: Secondary | ICD-10-CM | POA: Insufficient documentation

## 2020-12-06 DIAGNOSIS — D631 Anemia in chronic kidney disease: Secondary | ICD-10-CM

## 2020-12-06 DIAGNOSIS — E538 Deficiency of other specified B group vitamins: Secondary | ICD-10-CM | POA: Insufficient documentation

## 2020-12-06 DIAGNOSIS — N184 Chronic kidney disease, stage 4 (severe): Secondary | ICD-10-CM

## 2020-12-06 LAB — CBC WITH DIFFERENTIAL/PLATELET
Abs Immature Granulocytes: 0.03 10*3/uL (ref 0.00–0.07)
Basophils Absolute: 0 10*3/uL (ref 0.0–0.1)
Basophils Relative: 0 %
Eosinophils Absolute: 0.3 10*3/uL (ref 0.0–0.5)
Eosinophils Relative: 3 %
HCT: 24.7 % — ABNORMAL LOW (ref 39.0–52.0)
Hemoglobin: 8 g/dL — ABNORMAL LOW (ref 13.0–17.0)
Immature Granulocytes: 0 %
Lymphocytes Relative: 7 %
Lymphs Abs: 0.6 10*3/uL — ABNORMAL LOW (ref 0.7–4.0)
MCH: 27.3 pg (ref 26.0–34.0)
MCHC: 32.4 g/dL (ref 30.0–36.0)
MCV: 84.3 fL (ref 80.0–100.0)
Monocytes Absolute: 0.5 10*3/uL (ref 0.1–1.0)
Monocytes Relative: 6 %
Neutro Abs: 7.1 10*3/uL (ref 1.7–7.7)
Neutrophils Relative %: 84 %
Platelets: 245 10*3/uL (ref 150–400)
RBC: 2.93 MIL/uL — ABNORMAL LOW (ref 4.22–5.81)
RDW: 16.8 % — ABNORMAL HIGH (ref 11.5–15.5)
WBC: 8.6 10*3/uL (ref 4.0–10.5)
nRBC: 0 % (ref 0.0–0.2)

## 2020-12-06 MED ORDER — CYANOCOBALAMIN 1000 MCG/ML IJ SOLN
1000.0000 ug | Freq: Once | INTRAMUSCULAR | Status: AC
  Administered 2020-12-06: 1000 ug via INTRAMUSCULAR
  Filled 2020-12-06: qty 1

## 2020-12-06 NOTE — Progress Notes (Signed)
Danny [name] presents today for injection per the provider's orders.  Vitamin B12 administration without incident; injection site WNL; see MAR for injection details.  Patient tolerated procedure well and without incident.  No questions or complaints noted at this time. Patient discharged by wheelchair in stable condition.

## 2020-12-06 NOTE — Patient Instructions (Signed)
[place] at [facility] Discharge Instructions  You were seen today by Tarri Abernethy PA-C for your anemia.  Your anemia is due to your kidney failure, but we are going to start giving you weekly Retacrit shots to try to improve your blood counts.   We will also give you monthly B12 injections.  LABS: You will need to get labs once a week before your Retacrit injections (same day)   MEDICATIONS:  - B12 injections once a month - Retacrit injections once a week  FOLLOW-UP APPOINTMENT:  - Follow up with our clinic in 4 weeks - It is also EXTREMELY important that you follow up with your primary care doctor and kidney doctor AS SOON AS POSSIBLE.   Thank you for choosing [place] at [facility] to provide your oncology and hematology care.  To afford each patient quality time with our provider, please arrive at least 15 minutes before your scheduled appointment time.   If you have a lab appointment with the [facility] please come in thru the Main Entrance and check in at the main information desk.  You need to re-schedule your appointment should you arrive 10 or more minutes late.  We strive to give you quality time with our providers, and arriving late affects you and other patients whose appointments are after yours.  Also, if you no show three or more times for appointments you may be dismissed from the clinic at the providers discretion.     Again, thank you for choosing [facility].  Our hope is that these requests will decrease the amount of time that you wait before being seen by our physicians.       _____________________________________________________________  Should you have questions after your visit to [facility], please contact our office at [phone] and follow the prompts.  Our office hours are 8:00 a.m. and 4:30 p.m. Monday - Friday.  Please note that voicemails left after 4:00 p.m. may not be  returned until the following business day.  We are closed weekends and major holidays.  You do have access to a nurse 24-7, just call the main number to the clinic [phone] and do not press any options, hold on the line and a nurse will answer the phone.    For prescription refill requests, have your pharmacy contact our office and allow 72 hours.    Due to Covid, you will need to wear a mask upon entering the hospital. If you do not have a mask, a mask will be given to you at the Main Entrance upon arrival. For doctor visits, patients may have 1 support person age 59 or older with them. For treatment visits, patients can not have anyone with them due to social distancing guidelines and our immunocompromised population.

## 2020-12-07 ENCOUNTER — Inpatient Hospital Stay (HOSPITAL_COMMUNITY): Payer: Medicare HMO | Admitting: General Practice

## 2020-12-07 ENCOUNTER — Other Ambulatory Visit (HOSPITAL_COMMUNITY): Payer: Self-pay | Admitting: *Deleted

## 2020-12-07 ENCOUNTER — Telehealth (HOSPITAL_COMMUNITY): Payer: Self-pay | Admitting: *Deleted

## 2020-12-07 ENCOUNTER — Encounter (HOSPITAL_COMMUNITY): Payer: Self-pay | Admitting: General Practice

## 2020-12-07 DIAGNOSIS — N401 Enlarged prostate with lower urinary tract symptoms: Secondary | ICD-10-CM | POA: Diagnosis not present

## 2020-12-07 DIAGNOSIS — L89312 Pressure ulcer of right buttock, stage 2: Secondary | ICD-10-CM | POA: Diagnosis not present

## 2020-12-07 DIAGNOSIS — L97312 Non-pressure chronic ulcer of right ankle with fat layer exposed: Secondary | ICD-10-CM | POA: Diagnosis not present

## 2020-12-07 DIAGNOSIS — I5032 Chronic diastolic (congestive) heart failure: Secondary | ICD-10-CM | POA: Diagnosis not present

## 2020-12-07 DIAGNOSIS — I872 Venous insufficiency (chronic) (peripheral): Secondary | ICD-10-CM | POA: Diagnosis not present

## 2020-12-07 DIAGNOSIS — E1122 Type 2 diabetes mellitus with diabetic chronic kidney disease: Secondary | ICD-10-CM | POA: Diagnosis not present

## 2020-12-07 DIAGNOSIS — I130 Hypertensive heart and chronic kidney disease with heart failure and stage 1 through stage 4 chronic kidney disease, or unspecified chronic kidney disease: Secondary | ICD-10-CM | POA: Diagnosis not present

## 2020-12-07 DIAGNOSIS — N184 Chronic kidney disease, stage 4 (severe): Secondary | ICD-10-CM | POA: Diagnosis not present

## 2020-12-07 DIAGNOSIS — E1151 Type 2 diabetes mellitus with diabetic peripheral angiopathy without gangrene: Secondary | ICD-10-CM | POA: Diagnosis not present

## 2020-12-07 DIAGNOSIS — E11622 Type 2 diabetes mellitus with other skin ulcer: Secondary | ICD-10-CM | POA: Diagnosis not present

## 2020-12-07 LAB — OCCULT BLOOD X 1 CARD TO LAB, STOOL
Fecal Occult Bld: POSITIVE — AB
Fecal Occult Bld: POSITIVE — AB
Fecal Occult Bld: POSITIVE — AB

## 2020-12-07 NOTE — Telephone Encounter (Signed)
Pt was called and updated of results and change in treatment plan. Pt verbalized understanding and voiced no questions or concerns at this time. Pt was advised to call the clinic if he had questions.

## 2020-12-07 NOTE — Progress Notes (Addendum)
[facility] CSW Progress Notes  Referral received from [facility], concerned that patient has not followed up w PCP and/or nephrologist.  Called patient, no answer.  Called and spoke w sister Hendricks Limes.  She lives in [place] and is not able to help on ongoing basis.  States "somebody at the hospital has been helping him."  Clarified that referrals made by hospital social worker were to home health for PT and OT.  Patient was also given information about how to get a lift chair.  Family was also asked to help him apply for Medicaid as this would allow him to get access to long term SNF placement.  Per sister, issues w non adherence to upcoming appointments relate to inability to ride in a car.  He cannot walk at this point, uses a wheelchair exclusively.  He needs wheelchair [facility] transport at this point.  Until recently, he was able to get into a car from a wheelchair.  He has fallen several times at home and EMS was called.  Per sister "they are working on some kind of wheelchair transport that costs $4."  She did not know what this might be or who is "working on it."  Sister reports that patient often sleeps during the day and is up at night.  He does not hear the phone during the day. She will try to call him and get him to call CSW.    He is currently active w [facility] for PT and OT (spoke w representative Clarise Cruz   [phone]).  If they receive a referral, they can provide a HH SW.  HH Sw can help facilitate application for Medicaid and search for long term placement if needed.    Called [facility] - he is current w them for wheelchair transport.  He scheduled his own transport to [facility] appt yesterday 6/13.  They need 72 hours notice for scheduling.  He will pay $4/ride for [place] appts, $6/ride for [facility] rides.  CSW will talk w scheduler to determine whether to set ride for upcoming [facility] appointments.  Updated sister on transportation resources and [facility]  referrals.  Patient called, he is very unsure about his ability to "do all these appointments."  States he needs to have several days rest.  Also states that he needs several hours in the morning to get ready "I cannot do any early morning appointments."  Also aware that he needs wheelchair transport and can get this via [facility] if scheduled in advance.  He is skeptical about "all these tests the doctors want me to do."  Had one visit w cardiologist at [facility], did not go back as he was asked to have a "stress test."  In general, he voices that caring for himself is very difficult and he has very limited energy.  He states he has applied for Medicaid in the past, has been denied in the past as $100 over income.  He was encouraged to recontact DSS and explore his options.  He was agreeable to a referral to the [facility], given his multiple health concerns and psychosocial issues.  [facility] will submit that referral and agency will reach out to him.  [facility] team was asked to place referral for [facility] and make referral to [facility] for additional care management support.  Edwyna Shell, LCSW Clinical Social Worker Phone:  [phone]

## 2020-12-07 NOTE — Addendum Note (Signed)
Addended by: Tarri Abernethy on: 12/07/2020 09:14 AM   Modules accepted: Orders

## 2020-12-08 ENCOUNTER — Other Ambulatory Visit (HOSPITAL_COMMUNITY): Payer: Self-pay | Admitting: Physician Assistant

## 2020-12-08 DIAGNOSIS — N184 Chronic kidney disease, stage 4 (severe): Secondary | ICD-10-CM | POA: Diagnosis not present

## 2020-12-08 DIAGNOSIS — L89312 Pressure ulcer of right buttock, stage 2: Secondary | ICD-10-CM | POA: Diagnosis not present

## 2020-12-08 DIAGNOSIS — E1122 Type 2 diabetes mellitus with diabetic chronic kidney disease: Secondary | ICD-10-CM | POA: Diagnosis not present

## 2020-12-08 DIAGNOSIS — N186 End stage renal disease: Secondary | ICD-10-CM

## 2020-12-08 DIAGNOSIS — N401 Enlarged prostate with lower urinary tract symptoms: Secondary | ICD-10-CM | POA: Diagnosis not present

## 2020-12-08 DIAGNOSIS — I5032 Chronic diastolic (congestive) heart failure: Secondary | ICD-10-CM | POA: Diagnosis not present

## 2020-12-08 DIAGNOSIS — L97312 Non-pressure chronic ulcer of right ankle with fat layer exposed: Secondary | ICD-10-CM | POA: Diagnosis not present

## 2020-12-08 DIAGNOSIS — E11622 Type 2 diabetes mellitus with other skin ulcer: Secondary | ICD-10-CM | POA: Diagnosis not present

## 2020-12-08 DIAGNOSIS — D631 Anemia in chronic kidney disease: Secondary | ICD-10-CM

## 2020-12-08 DIAGNOSIS — I130 Hypertensive heart and chronic kidney disease with heart failure and stage 1 through stage 4 chronic kidney disease, or unspecified chronic kidney disease: Secondary | ICD-10-CM | POA: Diagnosis not present

## 2020-12-08 DIAGNOSIS — I872 Venous insufficiency (chronic) (peripheral): Secondary | ICD-10-CM | POA: Diagnosis not present

## 2020-12-08 DIAGNOSIS — E1151 Type 2 diabetes mellitus with diabetic peripheral angiopathy without gangrene: Secondary | ICD-10-CM | POA: Diagnosis not present

## 2020-12-08 LAB — UPEP/UIFE/LIGHT CHAINS/TP, 24-HR UR
% BETA, Urine: 20.7 %
ALPHA 1 URINE: 5.2 %
Albumin, U: 49.1 %
Alpha 2, Urine: 5.8 %
Free Kappa Lt Chains,Ur: 234.85 mg/L — ABNORMAL HIGH (ref 1.17–86.46)
Free Kappa/Lambda Ratio: 2.49 (ref 1.83–14.26)
Free Lambda Lt Chains,Ur: 94.15 mg/L — ABNORMAL HIGH (ref 0.27–15.21)
GAMMA GLOBULIN URINE: 19.3 %
Total Protein, Urine-Ur/day: 2488 mg/24 hr — ABNORMAL HIGH (ref 30–150)
Total Protein, Urine: 138.2 mg/dL
Total Volume: 1800

## 2020-12-08 NOTE — Progress Notes (Addendum)
We are continuing to work as a multidisciplinary team to address the health needs of Mr. Heard.  He has stated that he will not be able to come to the office every week for CBC and Retacrit injections, so we are coordinating with his pre-existing home health services to check weekly CBC and give Retacrit 10,000 units if Hgb is < 11.0.  I will still see [name] for office follow-up in 1 month.    He has been told once again of his need to see his PCP, nephrologist, and also gastroenterologist (for evaluation of possible occult GI bleed) as soon as possible.  Referral has been sent for home health social work and home health RN to help assist with this.  Messages have been left by our nursing staff with Dr. Juel Burrow office, and are awaiting callback.  Patient has also been recommended to go to the ER, which she refused.  He has been instructed to call EMS if he experiences any new or worsening symptoms.  Prescription for Retacrit 10,000 units per week has been sent to pharmacy.  Home health RN will administer.

## 2020-12-09 ENCOUNTER — Other Ambulatory Visit (HOSPITAL_COMMUNITY): Payer: Self-pay | Admitting: Physician Assistant

## 2020-12-09 ENCOUNTER — Telehealth (HOSPITAL_COMMUNITY): Payer: Self-pay | Admitting: *Deleted

## 2020-12-09 ENCOUNTER — Encounter (HOSPITAL_COMMUNITY): Payer: Self-pay | Admitting: *Deleted

## 2020-12-09 ENCOUNTER — Encounter (HOSPITAL_COMMUNITY): Payer: Self-pay | Admitting: Hematology

## 2020-12-09 DIAGNOSIS — L89312 Pressure ulcer of right buttock, stage 2: Secondary | ICD-10-CM | POA: Diagnosis not present

## 2020-12-09 DIAGNOSIS — N184 Chronic kidney disease, stage 4 (severe): Secondary | ICD-10-CM | POA: Diagnosis not present

## 2020-12-09 DIAGNOSIS — L97312 Non-pressure chronic ulcer of right ankle with fat layer exposed: Secondary | ICD-10-CM | POA: Diagnosis not present

## 2020-12-09 DIAGNOSIS — E1151 Type 2 diabetes mellitus with diabetic peripheral angiopathy without gangrene: Secondary | ICD-10-CM | POA: Diagnosis not present

## 2020-12-09 DIAGNOSIS — I5032 Chronic diastolic (congestive) heart failure: Secondary | ICD-10-CM | POA: Diagnosis not present

## 2020-12-09 DIAGNOSIS — N186 End stage renal disease: Secondary | ICD-10-CM

## 2020-12-09 DIAGNOSIS — E1122 Type 2 diabetes mellitus with diabetic chronic kidney disease: Secondary | ICD-10-CM | POA: Diagnosis not present

## 2020-12-09 DIAGNOSIS — D631 Anemia in chronic kidney disease: Secondary | ICD-10-CM

## 2020-12-09 DIAGNOSIS — E11622 Type 2 diabetes mellitus with other skin ulcer: Secondary | ICD-10-CM | POA: Diagnosis not present

## 2020-12-09 DIAGNOSIS — I130 Hypertensive heart and chronic kidney disease with heart failure and stage 1 through stage 4 chronic kidney disease, or unspecified chronic kidney disease: Secondary | ICD-10-CM | POA: Diagnosis not present

## 2020-12-09 DIAGNOSIS — N401 Enlarged prostate with lower urinary tract symptoms: Secondary | ICD-10-CM | POA: Diagnosis not present

## 2020-12-09 DIAGNOSIS — I872 Venous insufficiency (chronic) (peripheral): Secondary | ICD-10-CM | POA: Diagnosis not present

## 2020-12-09 MED ORDER — RETACRIT 10000 UNIT/ML IJ SOLN: 10000.0000 [IU] | INTRAMUSCULAR | 3 refills | Status: DC

## 2020-12-09 NOTE — Progress Notes (Signed)
Due to financial constraints, patient was unable to obtain Retacrit for outpatient [facility] RN to administer at home.  We will proceed with original plan for weekly CBC and Retacrit here in the clinic.

## 2020-12-09 NOTE — Addendum Note (Signed)
Addended by: Tarri Abernethy on: 12/09/2020 04:26 PM   Modules accepted: Orders

## 2020-12-09 NOTE — Telephone Encounter (Signed)
Received tc from patient to discuss home health.  They have been in touch with him and will see him tomorrow for labs and will administer Retacrit on Monday if needed.  States he has someone that can pick up Retacrit at [facility] for him so that [facility] can give it.  Made aware of follow up on July 12th with labs at 12:00.  Verbalized understanding.

## 2020-12-09 NOTE — Addendum Note (Signed)
Addended by: Tarri Abernethy on: 12/09/2020 12:02 PM   Modules accepted: Orders

## 2020-12-10 ENCOUNTER — Ambulatory Visit (HOSPITAL_COMMUNITY): Payer: Medicare HMO

## 2020-12-10 ENCOUNTER — Other Ambulatory Visit (HOSPITAL_COMMUNITY): Payer: Medicare HMO

## 2020-12-10 ENCOUNTER — Other Ambulatory Visit (HOSPITAL_COMMUNITY)
Admission: RE | Admit: 2020-12-10 | Discharge: 2020-12-10 | Disposition: A | Discharge status: Home / Self Care | Payer: Medicare HMO | Source: Other Acute Inpatient Hospital | Attending: Internal Medicine | Admitting: Internal Medicine

## 2020-12-10 ENCOUNTER — Telehealth (HOSPITAL_COMMUNITY): Payer: Self-pay | Admitting: *Deleted

## 2020-12-10 ENCOUNTER — Other Ambulatory Visit (HOSPITAL_COMMUNITY): Payer: Self-pay | Admitting: *Deleted

## 2020-12-10 DIAGNOSIS — I872 Venous insufficiency (chronic) (peripheral): Secondary | ICD-10-CM | POA: Diagnosis not present

## 2020-12-10 DIAGNOSIS — L89312 Pressure ulcer of right buttock, stage 2: Secondary | ICD-10-CM | POA: Diagnosis not present

## 2020-12-10 DIAGNOSIS — N185 Chronic kidney disease, stage 5: Secondary | ICD-10-CM | POA: Diagnosis not present

## 2020-12-10 DIAGNOSIS — I5032 Chronic diastolic (congestive) heart failure: Secondary | ICD-10-CM | POA: Insufficient documentation

## 2020-12-10 DIAGNOSIS — E11622 Type 2 diabetes mellitus with other skin ulcer: Secondary | ICD-10-CM | POA: Diagnosis not present

## 2020-12-10 DIAGNOSIS — I130 Hypertensive heart and chronic kidney disease with heart failure and stage 1 through stage 4 chronic kidney disease, or unspecified chronic kidney disease: Secondary | ICD-10-CM | POA: Diagnosis not present

## 2020-12-10 DIAGNOSIS — D631 Anemia in chronic kidney disease: Secondary | ICD-10-CM

## 2020-12-10 DIAGNOSIS — N186 End stage renal disease: Secondary | ICD-10-CM

## 2020-12-10 DIAGNOSIS — N184 Chronic kidney disease, stage 4 (severe): Secondary | ICD-10-CM | POA: Insufficient documentation

## 2020-12-10 DIAGNOSIS — N401 Enlarged prostate with lower urinary tract symptoms: Secondary | ICD-10-CM | POA: Diagnosis not present

## 2020-12-10 DIAGNOSIS — E1122 Type 2 diabetes mellitus with diabetic chronic kidney disease: Secondary | ICD-10-CM | POA: Diagnosis not present

## 2020-12-10 DIAGNOSIS — I10 Essential (primary) hypertension: Secondary | ICD-10-CM | POA: Diagnosis not present

## 2020-12-10 DIAGNOSIS — L97312 Non-pressure chronic ulcer of right ankle with fat layer exposed: Secondary | ICD-10-CM | POA: Diagnosis not present

## 2020-12-10 DIAGNOSIS — E1151 Type 2 diabetes mellitus with diabetic peripheral angiopathy without gangrene: Secondary | ICD-10-CM | POA: Diagnosis not present

## 2020-12-10 LAB — CBC WITH DIFFERENTIAL/PLATELET
Abs Immature Granulocytes: 0.02 10*3/uL (ref 0.00–0.07)
Basophils Absolute: 0 10*3/uL (ref 0.0–0.1)
Basophils Relative: 1 %
Eosinophils Absolute: 0.3 10*3/uL (ref 0.0–0.5)
Eosinophils Relative: 4 %
HCT: 25.7 % — ABNORMAL LOW (ref 39.0–52.0)
Hemoglobin: 8.3 g/dL — ABNORMAL LOW (ref 13.0–17.0)
Immature Granulocytes: 0 %
Lymphocytes Relative: 9 %
Lymphs Abs: 0.8 10*3/uL (ref 0.7–4.0)
MCH: 26.8 pg (ref 26.0–34.0)
MCHC: 32.3 g/dL (ref 30.0–36.0)
MCV: 82.9 fL (ref 80.0–100.0)
Monocytes Absolute: 0.6 10*3/uL (ref 0.1–1.0)
Monocytes Relative: 6 %
Neutro Abs: 7.1 10*3/uL (ref 1.7–7.7)
Neutrophils Relative %: 80 %
Platelets: 265 10*3/uL (ref 150–400)
RBC: 3.1 MIL/uL — ABNORMAL LOW (ref 4.22–5.81)
RDW: 17.3 % — ABNORMAL HIGH (ref 11.5–15.5)
WBC: 8.8 10*3/uL (ref 4.0–10.5)
nRBC: 0 % (ref 0.0–0.2)

## 2020-12-10 LAB — IRON AND TIBC
Iron: 33 ug/dL — ABNORMAL LOW (ref 45–182)
Saturation Ratios: 11 % — ABNORMAL LOW (ref 17.9–39.5)
TIBC: 301 ug/dL (ref 250–450)
UIBC: 268 ug/dL

## 2020-12-10 LAB — FERRITIN: Ferritin: 147 ng/mL (ref 24–336)

## 2020-12-10 NOTE — Telephone Encounter (Signed)
Spoke with Mr Lundstrom to advise of lab appointment on Tuesday at 12:30.  Unable to afford Retacrit at home as it is $400.  Will continue to come here for injections.  Labs drawn today per home health.  Tarri Abernethy to follow up on results and is aware.

## 2020-12-13 ENCOUNTER — Ambulatory Visit (HOSPITAL_COMMUNITY): Payer: Medicare HMO

## 2020-12-13 ENCOUNTER — Other Ambulatory Visit (HOSPITAL_COMMUNITY): Payer: Medicare HMO

## 2020-12-13 DIAGNOSIS — E1122 Type 2 diabetes mellitus with diabetic chronic kidney disease: Secondary | ICD-10-CM | POA: Diagnosis not present

## 2020-12-13 DIAGNOSIS — E1151 Type 2 diabetes mellitus with diabetic peripheral angiopathy without gangrene: Secondary | ICD-10-CM | POA: Diagnosis not present

## 2020-12-13 DIAGNOSIS — L97312 Non-pressure chronic ulcer of right ankle with fat layer exposed: Secondary | ICD-10-CM | POA: Diagnosis not present

## 2020-12-13 DIAGNOSIS — L89312 Pressure ulcer of right buttock, stage 2: Secondary | ICD-10-CM | POA: Diagnosis not present

## 2020-12-13 DIAGNOSIS — N401 Enlarged prostate with lower urinary tract symptoms: Secondary | ICD-10-CM | POA: Diagnosis not present

## 2020-12-13 DIAGNOSIS — N184 Chronic kidney disease, stage 4 (severe): Secondary | ICD-10-CM | POA: Diagnosis not present

## 2020-12-13 DIAGNOSIS — I5032 Chronic diastolic (congestive) heart failure: Secondary | ICD-10-CM | POA: Diagnosis not present

## 2020-12-13 DIAGNOSIS — E11622 Type 2 diabetes mellitus with other skin ulcer: Secondary | ICD-10-CM | POA: Diagnosis not present

## 2020-12-13 DIAGNOSIS — I872 Venous insufficiency (chronic) (peripheral): Secondary | ICD-10-CM | POA: Diagnosis not present

## 2020-12-13 DIAGNOSIS — I130 Hypertensive heart and chronic kidney disease with heart failure and stage 1 through stage 4 chronic kidney disease, or unspecified chronic kidney disease: Secondary | ICD-10-CM | POA: Diagnosis not present

## 2020-12-14 ENCOUNTER — Other Ambulatory Visit: Payer: Self-pay

## 2020-12-14 ENCOUNTER — Encounter (HOSPITAL_COMMUNITY): Payer: Self-pay | Admitting: General Practice

## 2020-12-14 ENCOUNTER — Inpatient Hospital Stay (HOSPITAL_COMMUNITY): Payer: Medicare HMO

## 2020-12-14 ENCOUNTER — Other Ambulatory Visit (HOSPITAL_COMMUNITY): Payer: Medicare HMO

## 2020-12-14 ENCOUNTER — Ambulatory Visit (HOSPITAL_COMMUNITY): Payer: Medicare HMO

## 2020-12-14 VITALS — BP 128/84 | HR 92 | Temp 97.0°F | Resp 20

## 2020-12-14 DIAGNOSIS — D631 Anemia in chronic kidney disease: Secondary | ICD-10-CM

## 2020-12-14 DIAGNOSIS — N184 Chronic kidney disease, stage 4 (severe): Secondary | ICD-10-CM | POA: Diagnosis not present

## 2020-12-14 DIAGNOSIS — E538 Deficiency of other specified B group vitamins: Secondary | ICD-10-CM | POA: Diagnosis not present

## 2020-12-14 DIAGNOSIS — D472 Monoclonal gammopathy: Secondary | ICD-10-CM | POA: Diagnosis not present

## 2020-12-14 DIAGNOSIS — I4891 Unspecified atrial fibrillation: Secondary | ICD-10-CM | POA: Diagnosis not present

## 2020-12-14 DIAGNOSIS — R5383 Other fatigue: Secondary | ICD-10-CM | POA: Diagnosis not present

## 2020-12-14 LAB — IRON AND TIBC
Iron: 42 ug/dL — ABNORMAL LOW (ref 45–182)
Saturation Ratios: 15 % — ABNORMAL LOW (ref 17.9–39.5)
TIBC: 286 ug/dL (ref 250–450)
UIBC: 244 ug/dL

## 2020-12-14 LAB — SAMPLE TO BLOOD BANK

## 2020-12-14 LAB — CBC WITH DIFFERENTIAL/PLATELET
Abs Immature Granulocytes: 0.03 10*3/uL (ref 0.00–0.07)
Basophils Absolute: 0.1 10*3/uL (ref 0.0–0.1)
Basophils Relative: 1 %
Eosinophils Absolute: 0.3 10*3/uL (ref 0.0–0.5)
Eosinophils Relative: 4 %
HCT: 25.9 % — ABNORMAL LOW (ref 39.0–52.0)
Hemoglobin: 8.2 g/dL — ABNORMAL LOW (ref 13.0–17.0)
Immature Granulocytes: 0 %
Lymphocytes Relative: 7 %
Lymphs Abs: 0.6 10*3/uL — ABNORMAL LOW (ref 0.7–4.0)
MCH: 27 pg (ref 26.0–34.0)
MCHC: 31.7 g/dL (ref 30.0–36.0)
MCV: 85.2 fL (ref 80.0–100.0)
Monocytes Absolute: 0.6 10*3/uL (ref 0.1–1.0)
Monocytes Relative: 7 %
Neutro Abs: 6.5 10*3/uL (ref 1.7–7.7)
Neutrophils Relative %: 81 %
Platelets: 217 10*3/uL (ref 150–400)
RBC: 3.04 MIL/uL — ABNORMAL LOW (ref 4.22–5.81)
RDW: 17.6 % — ABNORMAL HIGH (ref 11.5–15.5)
WBC: 8 10*3/uL (ref 4.0–10.5)
nRBC: 0 % (ref 0.0–0.2)

## 2020-12-14 LAB — FERRITIN: Ferritin: 139 ng/mL (ref 24–336)

## 2020-12-14 MED ORDER — EPOETIN ALFA-EPBX 10000 UNIT/ML IJ SOLN
10000.0000 [IU] | Freq: Once | INTRAMUSCULAR | Status: AC
  Administered 2020-12-14: 10000 [IU] via SUBCUTANEOUS
  Filled 2020-12-14: qty 1

## 2020-12-14 NOTE — Patient Instructions (Signed)
[facility]  Discharge Instructions: Thank you for choosing [facility] to provide your oncology and hematology care.  If you have a lab appointment with the Cancer Center, please come in thru the Main Entrance and check in at the main information desk.    We strive to give you quality time with your provider. You may need to reschedule your appointment if you arrive late (15 or more minutes).  Arriving late affects you and other patients whose appointments are after yours.  Also, if you miss three or more appointments without notifying the office, you may be dismissed from the clinic at the provider's discretion.      For prescription refill requests, have your pharmacy contact our office and allow 72 hours for refills to be completed.        Items with * indicate a potential emergency and should be followed up as soon as possible or go to the Emergency Department if any problems should occur.  Please show the CHEMOTHERAPY ALERT CARD or IMMUNOTHERAPY ALERT CARD at check-in to the Emergency Department and triage nurse.  Should you have questions after your visit or need to cancel or reschedule your appointment, please contact [facility] [phone]  and follow the prompts.  Office hours are 8:00 a.m. to 4:30 p.m. Monday - Friday. Please note that voicemails left after 4:00 p.m. may not be returned until the following business day.  We are closed weekends and major holidays. You have access to a nurse at all times for urgent questions. Please call the main number to the clinic [phone] and follow the prompts.  For any non-urgent questions, you may also contact your provider using MyChart. We now offer e-Visits for anyone 18 and older to request care online for non-urgent symptoms. For details visit [email or web address].   Also download the MyChart app! Go to the app store, search "MyChart", open the app, select [facility], and log in with your  MyChart username and password.  Due to Covid, a mask is required upon entering the hospital/clinic. If you do not have a mask, one will be given to you upon arrival. For doctor visits, patients may have 1 support person aged 18 or older with them. For treatment visits, patients cannot have anyone with them due to current Covid guidelines and our immunocompromised population.  

## 2020-12-14 NOTE — Progress Notes (Signed)
Patient tolerated Retacrit 10,000 unit injection with no complaints voiced.  Site clean and dry with no bruising or swelling noted.  No complaints of pain.  Discharged with vital signs stable and no signs or symptoms of distress noted.

## 2020-12-14 NOTE — Progress Notes (Signed)
[facility] CSW Progress Notes  Met w patient in exam room at [facility].  He is currently receiving [place] RN services from [facility].  Also reports that he has seen a HH OT (possibly PT).  Per patient, his main complaint is having enough energy for his ADLs at home - he lives alone in a ground floor apartment.  He ambulates primarily in a wheelchair, the apartment is not especially handicap accessible and the standard size chair just fits through door openings and similar.  He does have a ramp from parking lot to sidewalk and another ramp from sidewalk to his front door.  However, he needs to self propel his wheelchair up both ramps in order to access wheelchair Lucianne Lei transport offered by [facility].  He uses both his feet to shuffle the chair as well as his arms to turn the wheels - the process is exhausting for him.  He says one of the [facility] providers has said they will look into a referral for a power wheelchair but he is unsure where this stands.  He has a shower chair, but no transfer bench for the shower.  He has been reluctant to bath (other than using wipes) due to fear of falling in transferring from wheelchair to shower chair as there is a gap.  He describes a challenging transfer from wheelchair to bed which involves lowering the bed to same height as wheelchair and removing an arm bracket from wheelchair so he can slide from chair to bed.  Transfers are especially difficult and fatiguing.  Patient does live alone and has no nearby family members to help him.  Sometimes he misses medical appointments because he is simply too fatigued to get out of his apartment in order to access wheelchair [place] transport.    CSW and patient discussed "right sizing" his living situation so what he needs to do is consistent with the energy he has available.  He admits that living alone is expending all his energy with little/no opportunity to regroup. He felt "much better" at his last hospital discharge and a B-12 shot -  this allowed him to hope that he could perhaps regain his strength/energy and return to his independent living situation.  He did discharge himself from SNF rehab at [facility] as he thought he would begin to regain energy after returning home - this did not happen as he had hoped.  CSW and patient discussed the option of exploring assisted living or family care home as an alternative to living on his own.  He is considering this possibliity. He was encouraged to call DSS to talk about his options for Special Assistance Medicaid (in addition to his Medicare) which would fund placement in assisted living.    CSW will talk w [facility] rep Alex Gardener about patients need for a [facility] SW, as well as the DME (power wheelchair) he has requested.  He also stated his current manual wheelchair has a decaying seat and damage to the armrests - he believes the chair is 67 years old.  He would prefer pursuing a power wheelchair as he does need the extra help getting around vs getting another manual wheelchair.  Encouraged patient to think about his options for living situation support, will talk again next week to further pursue.  Edwyna Shell, LCSW Clinical Social Worker Phone:  [phone]

## 2020-12-15 ENCOUNTER — Encounter (INDEPENDENT_AMBULATORY_CARE_PROVIDER_SITE_OTHER): Payer: Self-pay | Admitting: *Deleted

## 2020-12-15 DIAGNOSIS — I130 Hypertensive heart and chronic kidney disease with heart failure and stage 1 through stage 4 chronic kidney disease, or unspecified chronic kidney disease: Secondary | ICD-10-CM | POA: Diagnosis not present

## 2020-12-15 DIAGNOSIS — L97312 Non-pressure chronic ulcer of right ankle with fat layer exposed: Secondary | ICD-10-CM | POA: Diagnosis not present

## 2020-12-15 DIAGNOSIS — I5032 Chronic diastolic (congestive) heart failure: Secondary | ICD-10-CM | POA: Diagnosis not present

## 2020-12-15 DIAGNOSIS — N184 Chronic kidney disease, stage 4 (severe): Secondary | ICD-10-CM | POA: Diagnosis not present

## 2020-12-15 DIAGNOSIS — E11622 Type 2 diabetes mellitus with other skin ulcer: Secondary | ICD-10-CM | POA: Diagnosis not present

## 2020-12-15 DIAGNOSIS — I872 Venous insufficiency (chronic) (peripheral): Secondary | ICD-10-CM | POA: Diagnosis not present

## 2020-12-15 DIAGNOSIS — L89312 Pressure ulcer of right buttock, stage 2: Secondary | ICD-10-CM | POA: Diagnosis not present

## 2020-12-15 DIAGNOSIS — N401 Enlarged prostate with lower urinary tract symptoms: Secondary | ICD-10-CM | POA: Diagnosis not present

## 2020-12-15 DIAGNOSIS — E1151 Type 2 diabetes mellitus with diabetic peripheral angiopathy without gangrene: Secondary | ICD-10-CM | POA: Diagnosis not present

## 2020-12-15 DIAGNOSIS — E1122 Type 2 diabetes mellitus with diabetic chronic kidney disease: Secondary | ICD-10-CM | POA: Diagnosis not present

## 2020-12-16 ENCOUNTER — Encounter: Payer: Self-pay | Admitting: General Practice

## 2020-12-16 DIAGNOSIS — E11622 Type 2 diabetes mellitus with other skin ulcer: Secondary | ICD-10-CM | POA: Diagnosis not present

## 2020-12-16 DIAGNOSIS — N401 Enlarged prostate with lower urinary tract symptoms: Secondary | ICD-10-CM | POA: Diagnosis not present

## 2020-12-16 DIAGNOSIS — I5032 Chronic diastolic (congestive) heart failure: Secondary | ICD-10-CM | POA: Diagnosis not present

## 2020-12-16 DIAGNOSIS — E1122 Type 2 diabetes mellitus with diabetic chronic kidney disease: Secondary | ICD-10-CM | POA: Diagnosis not present

## 2020-12-16 DIAGNOSIS — L89312 Pressure ulcer of right buttock, stage 2: Secondary | ICD-10-CM | POA: Diagnosis not present

## 2020-12-16 DIAGNOSIS — E1151 Type 2 diabetes mellitus with diabetic peripheral angiopathy without gangrene: Secondary | ICD-10-CM | POA: Diagnosis not present

## 2020-12-16 DIAGNOSIS — L97312 Non-pressure chronic ulcer of right ankle with fat layer exposed: Secondary | ICD-10-CM | POA: Diagnosis not present

## 2020-12-16 DIAGNOSIS — I130 Hypertensive heart and chronic kidney disease with heart failure and stage 1 through stage 4 chronic kidney disease, or unspecified chronic kidney disease: Secondary | ICD-10-CM | POA: Diagnosis not present

## 2020-12-16 DIAGNOSIS — I872 Venous insufficiency (chronic) (peripheral): Secondary | ICD-10-CM | POA: Diagnosis not present

## 2020-12-16 DIAGNOSIS — N184 Chronic kidney disease, stage 4 (severe): Secondary | ICD-10-CM | POA: Diagnosis not present

## 2020-12-16 NOTE — Progress Notes (Signed)
[facility] CSW Progress Notes  Secure staff message from [place], [place].  Says their PT/OT team has contacted supplier about evaluation for power wheelchair.  Also notes that their staff reports sister is helping patient complete application for Medicaid.  HH is willing to help him find ALF/FCH placement if he desires this help.  Edwyna Shell, LCSW Clinical Social Worker Phone:  [phone]

## 2020-12-17 ENCOUNTER — Other Ambulatory Visit (HOSPITAL_COMMUNITY): Payer: Medicare HMO

## 2020-12-17 ENCOUNTER — Ambulatory Visit (HOSPITAL_COMMUNITY): Payer: Medicare HMO

## 2020-12-17 DIAGNOSIS — N184 Chronic kidney disease, stage 4 (severe): Secondary | ICD-10-CM | POA: Diagnosis not present

## 2020-12-17 DIAGNOSIS — I130 Hypertensive heart and chronic kidney disease with heart failure and stage 1 through stage 4 chronic kidney disease, or unspecified chronic kidney disease: Secondary | ICD-10-CM | POA: Diagnosis not present

## 2020-12-17 DIAGNOSIS — L97312 Non-pressure chronic ulcer of right ankle with fat layer exposed: Secondary | ICD-10-CM | POA: Diagnosis not present

## 2020-12-17 DIAGNOSIS — N401 Enlarged prostate with lower urinary tract symptoms: Secondary | ICD-10-CM | POA: Diagnosis not present

## 2020-12-17 DIAGNOSIS — E1122 Type 2 diabetes mellitus with diabetic chronic kidney disease: Secondary | ICD-10-CM | POA: Diagnosis not present

## 2020-12-17 DIAGNOSIS — E11622 Type 2 diabetes mellitus with other skin ulcer: Secondary | ICD-10-CM | POA: Diagnosis not present

## 2020-12-17 DIAGNOSIS — I872 Venous insufficiency (chronic) (peripheral): Secondary | ICD-10-CM | POA: Diagnosis not present

## 2020-12-17 DIAGNOSIS — I5032 Chronic diastolic (congestive) heart failure: Secondary | ICD-10-CM | POA: Diagnosis not present

## 2020-12-17 DIAGNOSIS — E1151 Type 2 diabetes mellitus with diabetic peripheral angiopathy without gangrene: Secondary | ICD-10-CM | POA: Diagnosis not present

## 2020-12-17 DIAGNOSIS — L89312 Pressure ulcer of right buttock, stage 2: Secondary | ICD-10-CM | POA: Diagnosis not present

## 2020-12-20 ENCOUNTER — Other Ambulatory Visit (HOSPITAL_COMMUNITY): Payer: Medicare HMO

## 2020-12-20 ENCOUNTER — Ambulatory Visit (HOSPITAL_COMMUNITY): Payer: Medicare HMO

## 2020-12-20 DIAGNOSIS — N184 Chronic kidney disease, stage 4 (severe): Secondary | ICD-10-CM | POA: Diagnosis not present

## 2020-12-20 DIAGNOSIS — N401 Enlarged prostate with lower urinary tract symptoms: Secondary | ICD-10-CM | POA: Diagnosis not present

## 2020-12-20 DIAGNOSIS — E1122 Type 2 diabetes mellitus with diabetic chronic kidney disease: Secondary | ICD-10-CM | POA: Diagnosis not present

## 2020-12-20 DIAGNOSIS — E11622 Type 2 diabetes mellitus with other skin ulcer: Secondary | ICD-10-CM | POA: Diagnosis not present

## 2020-12-20 DIAGNOSIS — L97312 Non-pressure chronic ulcer of right ankle with fat layer exposed: Secondary | ICD-10-CM | POA: Diagnosis not present

## 2020-12-20 DIAGNOSIS — E1151 Type 2 diabetes mellitus with diabetic peripheral angiopathy without gangrene: Secondary | ICD-10-CM | POA: Diagnosis not present

## 2020-12-20 DIAGNOSIS — I5032 Chronic diastolic (congestive) heart failure: Secondary | ICD-10-CM | POA: Diagnosis not present

## 2020-12-20 DIAGNOSIS — L89312 Pressure ulcer of right buttock, stage 2: Secondary | ICD-10-CM | POA: Diagnosis not present

## 2020-12-20 DIAGNOSIS — I872 Venous insufficiency (chronic) (peripheral): Secondary | ICD-10-CM | POA: Diagnosis not present

## 2020-12-20 DIAGNOSIS — I130 Hypertensive heart and chronic kidney disease with heart failure and stage 1 through stage 4 chronic kidney disease, or unspecified chronic kidney disease: Secondary | ICD-10-CM | POA: Diagnosis not present

## 2020-12-21 ENCOUNTER — Ambulatory Visit (HOSPITAL_COMMUNITY): Payer: Medicare HMO

## 2020-12-21 ENCOUNTER — Inpatient Hospital Stay (HOSPITAL_COMMUNITY): Payer: Medicare HMO | Admitting: General Practice

## 2020-12-21 ENCOUNTER — Other Ambulatory Visit (HOSPITAL_COMMUNITY): Payer: Medicare HMO | Admitting: General Practice

## 2020-12-21 ENCOUNTER — Inpatient Hospital Stay (HOSPITAL_COMMUNITY): Payer: Medicare HMO

## 2020-12-21 ENCOUNTER — Other Ambulatory Visit (HOSPITAL_COMMUNITY): Payer: Medicare HMO

## 2020-12-21 DIAGNOSIS — E538 Deficiency of other specified B group vitamins: Secondary | ICD-10-CM

## 2020-12-21 DIAGNOSIS — N185 Chronic kidney disease, stage 5: Secondary | ICD-10-CM | POA: Diagnosis not present

## 2020-12-21 DIAGNOSIS — E1122 Type 2 diabetes mellitus with diabetic chronic kidney disease: Secondary | ICD-10-CM | POA: Diagnosis not present

## 2020-12-21 DIAGNOSIS — I5032 Chronic diastolic (congestive) heart failure: Secondary | ICD-10-CM | POA: Diagnosis not present

## 2020-12-21 DIAGNOSIS — N189 Chronic kidney disease, unspecified: Secondary | ICD-10-CM | POA: Diagnosis not present

## 2020-12-21 DIAGNOSIS — D631 Anemia in chronic kidney disease: Secondary | ICD-10-CM | POA: Diagnosis not present

## 2020-12-21 DIAGNOSIS — D509 Iron deficiency anemia, unspecified: Secondary | ICD-10-CM | POA: Diagnosis not present

## 2020-12-21 DIAGNOSIS — I129 Hypertensive chronic kidney disease with stage 1 through stage 4 chronic kidney disease, or unspecified chronic kidney disease: Secondary | ICD-10-CM | POA: Diagnosis not present

## 2020-12-21 NOTE — Progress Notes (Addendum)
[facility] CSW Progress Notes  CSW called [facility] as they are working w patient re his needs.  Per [employer], [facility] reports that sister is helping him w Medicaid application and they have talked w him about options for handicap accessible and assisted living types of situations.  HH rep will call CSW back w a progress report based on their [facility] OT and PT assessments.  Patient called back - he was at nephrologist this morning.  Was told that he may need dialysis and he will have another appointment in one month to discuss.  He is having a visit from [facility] today in order to learn more about Assisted Living options.  He continues to have [facility] services from [facility].  [facility] has arranged and evaluation for a power wheelchair in the home next Tuesday 12/28/20.  He also has spoken w family who are encouraging him to think about an assisted living facility in [facility] which would be closer to family.  He is torn as he would like to remain close to his current medical care providers.  He is aware that his current lack of energy limits his ability to remain in his current apartment.    Edwyna Shell, LCSW Clinical Social Worker Phone:  [phone]

## 2020-12-23 ENCOUNTER — Telehealth: Payer: Self-pay | Admitting: General Practice

## 2020-12-23 DIAGNOSIS — I130 Hypertensive heart and chronic kidney disease with heart failure and stage 1 through stage 4 chronic kidney disease, or unspecified chronic kidney disease: Secondary | ICD-10-CM | POA: Diagnosis not present

## 2020-12-23 DIAGNOSIS — I872 Venous insufficiency (chronic) (peripheral): Secondary | ICD-10-CM | POA: Diagnosis not present

## 2020-12-23 DIAGNOSIS — L89312 Pressure ulcer of right buttock, stage 2: Secondary | ICD-10-CM | POA: Diagnosis not present

## 2020-12-23 DIAGNOSIS — N184 Chronic kidney disease, stage 4 (severe): Secondary | ICD-10-CM | POA: Diagnosis not present

## 2020-12-23 DIAGNOSIS — E1151 Type 2 diabetes mellitus with diabetic peripheral angiopathy without gangrene: Secondary | ICD-10-CM | POA: Diagnosis not present

## 2020-12-23 DIAGNOSIS — E11622 Type 2 diabetes mellitus with other skin ulcer: Secondary | ICD-10-CM | POA: Diagnosis not present

## 2020-12-23 DIAGNOSIS — L97312 Non-pressure chronic ulcer of right ankle with fat layer exposed: Secondary | ICD-10-CM | POA: Diagnosis not present

## 2020-12-23 DIAGNOSIS — I5032 Chronic diastolic (congestive) heart failure: Secondary | ICD-10-CM | POA: Diagnosis not present

## 2020-12-23 DIAGNOSIS — E1122 Type 2 diabetes mellitus with diabetic chronic kidney disease: Secondary | ICD-10-CM | POA: Diagnosis not present

## 2020-12-23 DIAGNOSIS — N401 Enlarged prostate with lower urinary tract symptoms: Secondary | ICD-10-CM | POA: Diagnosis not present

## 2020-12-23 NOTE — Telephone Encounter (Signed)
[facility] CSW Progress Notes  Sister Hulan Amato returned my call.  She has tried to help patient w Medicaid application, but at this point patient needs to complete his part of the application.  States patient is thinking over his options and considering the challenge of moving out of his apartment should he choose to enter assisted living.  Family is willing to help as they are able, but other family members have health problems which will limit their ability to help.  Affirmed progress as patient is now willing to consider another living situation which would provide more help to him.   Edwyna Shell, LCSW Clinical Social Worker Phone:  [phone]

## 2020-12-24 ENCOUNTER — Ambulatory Visit (HOSPITAL_COMMUNITY): Payer: Medicare HMO

## 2020-12-24 ENCOUNTER — Other Ambulatory Visit (HOSPITAL_COMMUNITY): Payer: Medicare HMO

## 2020-12-24 DIAGNOSIS — N401 Enlarged prostate with lower urinary tract symptoms: Secondary | ICD-10-CM | POA: Diagnosis not present

## 2020-12-24 DIAGNOSIS — N184 Chronic kidney disease, stage 4 (severe): Secondary | ICD-10-CM | POA: Diagnosis not present

## 2020-12-24 DIAGNOSIS — E11622 Type 2 diabetes mellitus with other skin ulcer: Secondary | ICD-10-CM | POA: Diagnosis not present

## 2020-12-24 DIAGNOSIS — E1151 Type 2 diabetes mellitus with diabetic peripheral angiopathy without gangrene: Secondary | ICD-10-CM | POA: Diagnosis not present

## 2020-12-24 DIAGNOSIS — L97312 Non-pressure chronic ulcer of right ankle with fat layer exposed: Secondary | ICD-10-CM | POA: Diagnosis not present

## 2020-12-24 DIAGNOSIS — I130 Hypertensive heart and chronic kidney disease with heart failure and stage 1 through stage 4 chronic kidney disease, or unspecified chronic kidney disease: Secondary | ICD-10-CM | POA: Diagnosis not present

## 2020-12-24 DIAGNOSIS — I872 Venous insufficiency (chronic) (peripheral): Secondary | ICD-10-CM | POA: Diagnosis not present

## 2020-12-24 DIAGNOSIS — L89312 Pressure ulcer of right buttock, stage 2: Secondary | ICD-10-CM | POA: Diagnosis not present

## 2020-12-24 DIAGNOSIS — I5032 Chronic diastolic (congestive) heart failure: Secondary | ICD-10-CM | POA: Diagnosis not present

## 2020-12-24 DIAGNOSIS — E1122 Type 2 diabetes mellitus with diabetic chronic kidney disease: Secondary | ICD-10-CM | POA: Diagnosis not present

## 2020-12-27 ENCOUNTER — Other Ambulatory Visit (HOSPITAL_COMMUNITY)
Admission: RE | Admit: 2020-12-27 | Discharge: 2020-12-27 | Disposition: A | Discharge status: Home / Self Care | Payer: Medicare HMO | Source: Other Acute Inpatient Hospital | Attending: Dermatology | Admitting: Dermatology

## 2020-12-27 DIAGNOSIS — E1122 Type 2 diabetes mellitus with diabetic chronic kidney disease: Secondary | ICD-10-CM | POA: Diagnosis not present

## 2020-12-27 DIAGNOSIS — I5032 Chronic diastolic (congestive) heart failure: Secondary | ICD-10-CM | POA: Diagnosis not present

## 2020-12-27 DIAGNOSIS — D631 Anemia in chronic kidney disease: Secondary | ICD-10-CM | POA: Insufficient documentation

## 2020-12-27 DIAGNOSIS — E1151 Type 2 diabetes mellitus with diabetic peripheral angiopathy without gangrene: Secondary | ICD-10-CM | POA: Diagnosis not present

## 2020-12-27 DIAGNOSIS — N401 Enlarged prostate with lower urinary tract symptoms: Secondary | ICD-10-CM | POA: Diagnosis not present

## 2020-12-27 DIAGNOSIS — I130 Hypertensive heart and chronic kidney disease with heart failure and stage 1 through stage 4 chronic kidney disease, or unspecified chronic kidney disease: Secondary | ICD-10-CM | POA: Diagnosis not present

## 2020-12-27 DIAGNOSIS — N186 End stage renal disease: Secondary | ICD-10-CM | POA: Insufficient documentation

## 2020-12-27 DIAGNOSIS — N184 Chronic kidney disease, stage 4 (severe): Secondary | ICD-10-CM | POA: Diagnosis not present

## 2020-12-27 DIAGNOSIS — L89312 Pressure ulcer of right buttock, stage 2: Secondary | ICD-10-CM | POA: Diagnosis not present

## 2020-12-27 DIAGNOSIS — E11622 Type 2 diabetes mellitus with other skin ulcer: Secondary | ICD-10-CM | POA: Diagnosis not present

## 2020-12-27 DIAGNOSIS — L97312 Non-pressure chronic ulcer of right ankle with fat layer exposed: Secondary | ICD-10-CM | POA: Diagnosis not present

## 2020-12-27 DIAGNOSIS — I872 Venous insufficiency (chronic) (peripheral): Secondary | ICD-10-CM | POA: Diagnosis not present

## 2020-12-27 LAB — CBC WITH DIFFERENTIAL/PLATELET
Abs Immature Granulocytes: 0.03 10*3/uL (ref 0.00–0.07)
Basophils Absolute: 0 10*3/uL (ref 0.0–0.1)
Basophils Relative: 1 %
Eosinophils Absolute: 0.3 10*3/uL (ref 0.0–0.5)
Eosinophils Relative: 4 %
HCT: 26 % — ABNORMAL LOW (ref 39.0–52.0)
Hemoglobin: 8.4 g/dL — ABNORMAL LOW (ref 13.0–17.0)
Immature Granulocytes: 0 %
Lymphocytes Relative: 10 %
Lymphs Abs: 0.8 10*3/uL (ref 0.7–4.0)
MCH: 27.4 pg (ref 26.0–34.0)
MCHC: 32.3 g/dL (ref 30.0–36.0)
MCV: 84.7 fL (ref 80.0–100.0)
Monocytes Absolute: 0.5 10*3/uL (ref 0.1–1.0)
Monocytes Relative: 7 %
Neutro Abs: 5.9 10*3/uL (ref 1.7–7.7)
Neutrophils Relative %: 78 %
Platelets: 230 10*3/uL (ref 150–400)
RBC: 3.07 MIL/uL — ABNORMAL LOW (ref 4.22–5.81)
RDW: 18.8 % — ABNORMAL HIGH (ref 11.5–15.5)
WBC: 7.6 10*3/uL (ref 4.0–10.5)
nRBC: 0 % (ref 0.0–0.2)

## 2020-12-27 LAB — IRON AND TIBC
Iron: 37 ug/dL — ABNORMAL LOW (ref 45–182)
Saturation Ratios: 12 % — ABNORMAL LOW (ref 17.9–39.5)
TIBC: 305 ug/dL (ref 250–450)
UIBC: 268 ug/dL

## 2020-12-27 LAB — FERRITIN: Ferritin: 126 ng/mL (ref 24–336)

## 2020-12-28 ENCOUNTER — Other Ambulatory Visit (HOSPITAL_COMMUNITY): Payer: Medicare HMO

## 2020-12-28 ENCOUNTER — Ambulatory Visit (HOSPITAL_COMMUNITY): Payer: Medicare HMO

## 2020-12-28 ENCOUNTER — Inpatient Hospital Stay (HOSPITAL_COMMUNITY): Payer: Medicare HMO

## 2020-12-28 DIAGNOSIS — L89312 Pressure ulcer of right buttock, stage 2: Secondary | ICD-10-CM | POA: Diagnosis not present

## 2020-12-28 DIAGNOSIS — E1151 Type 2 diabetes mellitus with diabetic peripheral angiopathy without gangrene: Secondary | ICD-10-CM | POA: Diagnosis not present

## 2020-12-28 DIAGNOSIS — I130 Hypertensive heart and chronic kidney disease with heart failure and stage 1 through stage 4 chronic kidney disease, or unspecified chronic kidney disease: Secondary | ICD-10-CM | POA: Diagnosis not present

## 2020-12-28 DIAGNOSIS — L97312 Non-pressure chronic ulcer of right ankle with fat layer exposed: Secondary | ICD-10-CM | POA: Diagnosis not present

## 2020-12-28 DIAGNOSIS — E1122 Type 2 diabetes mellitus with diabetic chronic kidney disease: Secondary | ICD-10-CM | POA: Diagnosis not present

## 2020-12-28 DIAGNOSIS — E11622 Type 2 diabetes mellitus with other skin ulcer: Secondary | ICD-10-CM | POA: Diagnosis not present

## 2020-12-28 DIAGNOSIS — I5032 Chronic diastolic (congestive) heart failure: Secondary | ICD-10-CM | POA: Diagnosis not present

## 2020-12-28 DIAGNOSIS — N401 Enlarged prostate with lower urinary tract symptoms: Secondary | ICD-10-CM | POA: Diagnosis not present

## 2020-12-28 DIAGNOSIS — I872 Venous insufficiency (chronic) (peripheral): Secondary | ICD-10-CM | POA: Diagnosis not present

## 2020-12-28 DIAGNOSIS — N184 Chronic kidney disease, stage 4 (severe): Secondary | ICD-10-CM | POA: Diagnosis not present

## 2020-12-29 ENCOUNTER — Inpatient Hospital Stay (HOSPITAL_COMMUNITY): Payer: Medicare HMO | Attending: Hematology

## 2020-12-29 ENCOUNTER — Other Ambulatory Visit (HOSPITAL_COMMUNITY): Payer: Medicare HMO

## 2020-12-29 ENCOUNTER — Other Ambulatory Visit: Payer: Self-pay

## 2020-12-29 ENCOUNTER — Encounter (HOSPITAL_COMMUNITY): Payer: Self-pay

## 2020-12-29 VITALS — BP 137/80 | HR 87 | Temp 96.8°F | Resp 18

## 2020-12-29 DIAGNOSIS — D631 Anemia in chronic kidney disease: Secondary | ICD-10-CM | POA: Diagnosis not present

## 2020-12-29 DIAGNOSIS — E538 Deficiency of other specified B group vitamins: Secondary | ICD-10-CM

## 2020-12-29 DIAGNOSIS — N184 Chronic kidney disease, stage 4 (severe): Secondary | ICD-10-CM | POA: Diagnosis not present

## 2020-12-29 MED ORDER — EPOETIN ALFA-EPBX 10000 UNIT/ML IJ SOLN
10000.0000 [IU] | Freq: Once | INTRAMUSCULAR | Status: AC
  Administered 2020-12-29: 10000 [IU] via SUBCUTANEOUS
  Filled 2020-12-29: qty 1

## 2020-12-29 NOTE — Patient Instructions (Signed)
[name]  Discharge Instructions: Thank you for choosing [facility] to provide your oncology and hematology care.  If you have a lab appointment with the [facility], please come in thru the Main Entrance and check in at the main information desk.  Wear comfortable clothing and clothing appropriate for easy access to any Portacath or PICC line.   We strive to give you quality time with your provider. You may need to reschedule your appointment if you arrive late (15 or more minutes).  Arriving late affects you and other patients whose appointments are after yours.  Also, if you miss three or more appointments without notifying the office, you may be dismissed from the clinic at the provider's discretion.      For prescription refill requests, have your pharmacy contact our office and allow 72 hours for refills to be completed.    Today you received Retacrit. Return as scheduled.   To help prevent nausea and vomiting after your treatment, we encourage you to take your nausea medication as directed.  BELOW ARE SYMPTOMS THAT SHOULD BE REPORTED IMMEDIATELY: *FEVER GREATER THAN 100.4 F (38 C) OR HIGHER *CHILLS OR SWEATING *NAUSEA AND VOMITING THAT IS NOT CONTROLLED WITH YOUR NAUSEA MEDICATION *UNUSUAL SHORTNESS OF BREATH *UNUSUAL BRUISING OR BLEEDING *URINARY PROBLEMS (pain or burning when urinating, or frequent urination) *BOWEL PROBLEMS (unusual diarrhea, constipation, pain near the anus) TENDERNESS IN MOUTH AND THROAT WITH OR WITHOUT PRESENCE OF ULCERS (sore throat, sores in mouth, or a toothache) UNUSUAL RASH, SWELLING OR PAIN  UNUSUAL VAGINAL DISCHARGE OR ITCHING   Items with * indicate a potential emergency and should be followed up as soon as possible or go to the Emergency Department if any problems should occur.  Please show the CHEMOTHERAPY ALERT CARD or IMMUNOTHERAPY ALERT CARD at check-in to the Emergency Department and triage nurse.  Should you  have questions after your visit or need to cancel or reschedule your appointment, please contact [facility] [phone]  and follow the prompts.  Office hours are 8:00 a.m. to 4:30 p.m. Monday - Friday. Please note that voicemails left after 4:00 p.m. may not be returned until the following business day.  We are closed weekends and major holidays. You have access to a nurse at all times for urgent questions. Please call the main number to the clinic [phone] and follow the prompts.  For any non-urgent questions, you may also contact your provider using MyChart. We now offer e-Visits for anyone 39 and older to request care online for non-urgent symptoms. For details visit [email or web address].   Also download the MyChart app! Go to the app store, search "MyChart", open the app, select , and log in with your MyChart username and password.  Due to Covid, a mask is required upon entering the hospital/clinic. If you do not have a mask, one will be given to you upon arrival. For doctor visits, patients may have 1 support person aged 60 or older with them. For treatment visits, patients cannot have anyone with them due to current Covid guidelines and our immunocompromised population.

## 2020-12-29 NOTE — Progress Notes (Signed)
Patient presents today for Retacrit injection. Hemoglobin reviewed prior to administration, 8.4 on 12/27/20.  VSS tolerated without incident or complaint. See MAR for details. Patient stable during and after injection. Patient discharged in satisfactory condition with no s/s of distress noted.

## 2020-12-30 DIAGNOSIS — E1151 Type 2 diabetes mellitus with diabetic peripheral angiopathy without gangrene: Secondary | ICD-10-CM | POA: Diagnosis not present

## 2020-12-30 DIAGNOSIS — I872 Venous insufficiency (chronic) (peripheral): Secondary | ICD-10-CM | POA: Diagnosis not present

## 2020-12-30 DIAGNOSIS — I130 Hypertensive heart and chronic kidney disease with heart failure and stage 1 through stage 4 chronic kidney disease, or unspecified chronic kidney disease: Secondary | ICD-10-CM | POA: Diagnosis not present

## 2020-12-30 DIAGNOSIS — E1122 Type 2 diabetes mellitus with diabetic chronic kidney disease: Secondary | ICD-10-CM | POA: Diagnosis not present

## 2020-12-30 DIAGNOSIS — I5032 Chronic diastolic (congestive) heart failure: Secondary | ICD-10-CM | POA: Diagnosis not present

## 2020-12-30 DIAGNOSIS — E11622 Type 2 diabetes mellitus with other skin ulcer: Secondary | ICD-10-CM | POA: Diagnosis not present

## 2020-12-30 DIAGNOSIS — N401 Enlarged prostate with lower urinary tract symptoms: Secondary | ICD-10-CM | POA: Diagnosis not present

## 2020-12-30 DIAGNOSIS — L97312 Non-pressure chronic ulcer of right ankle with fat layer exposed: Secondary | ICD-10-CM | POA: Diagnosis not present

## 2020-12-30 DIAGNOSIS — N184 Chronic kidney disease, stage 4 (severe): Secondary | ICD-10-CM | POA: Diagnosis not present

## 2020-12-30 DIAGNOSIS — L89312 Pressure ulcer of right buttock, stage 2: Secondary | ICD-10-CM | POA: Diagnosis not present

## 2020-12-31 ENCOUNTER — Ambulatory Visit (HOSPITAL_COMMUNITY): Payer: Medicare HMO | Admitting: Physician Assistant

## 2020-12-31 ENCOUNTER — Ambulatory Visit (HOSPITAL_COMMUNITY): Payer: Medicare HMO

## 2020-12-31 ENCOUNTER — Other Ambulatory Visit (HOSPITAL_COMMUNITY): Payer: Medicare HMO

## 2020-12-31 DIAGNOSIS — E11622 Type 2 diabetes mellitus with other skin ulcer: Secondary | ICD-10-CM | POA: Diagnosis not present

## 2020-12-31 DIAGNOSIS — M869 Osteomyelitis, unspecified: Secondary | ICD-10-CM | POA: Diagnosis not present

## 2020-12-31 DIAGNOSIS — E11621 Type 2 diabetes mellitus with foot ulcer: Secondary | ICD-10-CM | POA: Diagnosis not present

## 2020-12-31 DIAGNOSIS — E1122 Type 2 diabetes mellitus with diabetic chronic kidney disease: Secondary | ICD-10-CM | POA: Diagnosis not present

## 2020-12-31 DIAGNOSIS — I5032 Chronic diastolic (congestive) heart failure: Secondary | ICD-10-CM | POA: Diagnosis not present

## 2020-12-31 DIAGNOSIS — N401 Enlarged prostate with lower urinary tract symptoms: Secondary | ICD-10-CM | POA: Diagnosis not present

## 2020-12-31 DIAGNOSIS — L89312 Pressure ulcer of right buttock, stage 2: Secondary | ICD-10-CM | POA: Diagnosis not present

## 2020-12-31 DIAGNOSIS — L97312 Non-pressure chronic ulcer of right ankle with fat layer exposed: Secondary | ICD-10-CM | POA: Diagnosis not present

## 2020-12-31 DIAGNOSIS — I872 Venous insufficiency (chronic) (peripheral): Secondary | ICD-10-CM | POA: Diagnosis not present

## 2020-12-31 DIAGNOSIS — I130 Hypertensive heart and chronic kidney disease with heart failure and stage 1 through stage 4 chronic kidney disease, or unspecified chronic kidney disease: Secondary | ICD-10-CM | POA: Diagnosis not present

## 2020-12-31 DIAGNOSIS — E1151 Type 2 diabetes mellitus with diabetic peripheral angiopathy without gangrene: Secondary | ICD-10-CM | POA: Diagnosis not present

## 2020-12-31 DIAGNOSIS — N184 Chronic kidney disease, stage 4 (severe): Secondary | ICD-10-CM | POA: Diagnosis not present

## 2021-01-03 ENCOUNTER — Other Ambulatory Visit: Payer: Self-pay

## 2021-01-03 ENCOUNTER — Emergency Department (HOSPITAL_COMMUNITY): Payer: Medicare HMO

## 2021-01-03 ENCOUNTER — Emergency Department (HOSPITAL_COMMUNITY)
Admission: EM | Admit: 2021-01-03 | Discharge: 2021-01-04 | Disposition: A | Discharge status: Home / Self Care | Payer: Medicare HMO | Attending: Emergency Medicine | Admitting: Emergency Medicine

## 2021-01-03 DIAGNOSIS — L97312 Non-pressure chronic ulcer of right ankle with fat layer exposed: Secondary | ICD-10-CM | POA: Diagnosis not present

## 2021-01-03 DIAGNOSIS — J40 Bronchitis, not specified as acute or chronic: Secondary | ICD-10-CM | POA: Insufficient documentation

## 2021-01-03 DIAGNOSIS — Z20822 Contact with and (suspected) exposure to covid-19: Secondary | ICD-10-CM | POA: Insufficient documentation

## 2021-01-03 DIAGNOSIS — Z79899 Other long term (current) drug therapy: Secondary | ICD-10-CM | POA: Insufficient documentation

## 2021-01-03 DIAGNOSIS — R609 Edema, unspecified: Secondary | ICD-10-CM | POA: Diagnosis not present

## 2021-01-03 DIAGNOSIS — I5033 Acute on chronic diastolic (congestive) heart failure: Secondary | ICD-10-CM | POA: Diagnosis not present

## 2021-01-03 DIAGNOSIS — N184 Chronic kidney disease, stage 4 (severe): Secondary | ICD-10-CM | POA: Insufficient documentation

## 2021-01-03 DIAGNOSIS — Z87891 Personal history of nicotine dependence: Secondary | ICD-10-CM | POA: Diagnosis not present

## 2021-01-03 DIAGNOSIS — Z794 Long term (current) use of insulin: Secondary | ICD-10-CM | POA: Diagnosis not present

## 2021-01-03 DIAGNOSIS — R52 Pain, unspecified: Secondary | ICD-10-CM | POA: Diagnosis not present

## 2021-01-03 DIAGNOSIS — E1122 Type 2 diabetes mellitus with diabetic chronic kidney disease: Secondary | ICD-10-CM | POA: Diagnosis not present

## 2021-01-03 DIAGNOSIS — I130 Hypertensive heart and chronic kidney disease with heart failure and stage 1 through stage 4 chronic kidney disease, or unspecified chronic kidney disease: Secondary | ICD-10-CM | POA: Diagnosis not present

## 2021-01-03 DIAGNOSIS — N401 Enlarged prostate with lower urinary tract symptoms: Secondary | ICD-10-CM | POA: Diagnosis not present

## 2021-01-03 DIAGNOSIS — Z28310 Unvaccinated for covid-19: Secondary | ICD-10-CM | POA: Diagnosis not present

## 2021-01-03 DIAGNOSIS — R0602 Shortness of breath: Secondary | ICD-10-CM | POA: Diagnosis not present

## 2021-01-03 DIAGNOSIS — I5032 Chronic diastolic (congestive) heart failure: Secondary | ICD-10-CM | POA: Diagnosis not present

## 2021-01-03 DIAGNOSIS — E1151 Type 2 diabetes mellitus with diabetic peripheral angiopathy without gangrene: Secondary | ICD-10-CM | POA: Diagnosis not present

## 2021-01-03 DIAGNOSIS — R0902 Hypoxemia: Secondary | ICD-10-CM | POA: Diagnosis not present

## 2021-01-03 DIAGNOSIS — Z7982 Long term (current) use of aspirin: Secondary | ICD-10-CM | POA: Insufficient documentation

## 2021-01-03 DIAGNOSIS — R000 Tachycardia, unspecified: Secondary | ICD-10-CM | POA: Diagnosis not present

## 2021-01-03 DIAGNOSIS — I872 Venous insufficiency (chronic) (peripheral): Secondary | ICD-10-CM | POA: Diagnosis not present

## 2021-01-03 DIAGNOSIS — E11622 Type 2 diabetes mellitus with other skin ulcer: Secondary | ICD-10-CM | POA: Diagnosis not present

## 2021-01-03 DIAGNOSIS — I517 Cardiomegaly: Secondary | ICD-10-CM | POA: Diagnosis not present

## 2021-01-03 DIAGNOSIS — L89312 Pressure ulcer of right buttock, stage 2: Secondary | ICD-10-CM | POA: Diagnosis not present

## 2021-01-03 LAB — CBC WITH DIFFERENTIAL/PLATELET
Abs Immature Granulocytes: 0.03 10*3/uL (ref 0.00–0.07)
Basophils Absolute: 0 10*3/uL (ref 0.0–0.1)
Basophils Relative: 1 %
Eosinophils Absolute: 0.2 10*3/uL (ref 0.0–0.5)
Eosinophils Relative: 3 %
HCT: 26.4 % — ABNORMAL LOW (ref 39.0–52.0)
Hemoglobin: 8.4 g/dL — ABNORMAL LOW (ref 13.0–17.0)
Immature Granulocytes: 0 %
Lymphocytes Relative: 7 %
Lymphs Abs: 0.6 10*3/uL — ABNORMAL LOW (ref 0.7–4.0)
MCH: 27.3 pg (ref 26.0–34.0)
MCHC: 31.8 g/dL (ref 30.0–36.0)
MCV: 85.7 fL (ref 80.0–100.0)
Monocytes Absolute: 0.4 10*3/uL (ref 0.1–1.0)
Monocytes Relative: 5 %
Neutro Abs: 6.7 10*3/uL (ref 1.7–7.7)
Neutrophils Relative %: 84 %
Platelets: 266 10*3/uL (ref 150–400)
RBC: 3.08 MIL/uL — ABNORMAL LOW (ref 4.22–5.81)
RDW: 19.5 % — ABNORMAL HIGH (ref 11.5–15.5)
WBC: 8.1 10*3/uL (ref 4.0–10.5)
nRBC: 0 % (ref 0.0–0.2)

## 2021-01-03 LAB — BASIC METABOLIC PANEL
Anion gap: 8 (ref 5–15)
BUN: 68 mg/dL — ABNORMAL HIGH (ref 8–23)
CO2: 23 mmol/L (ref 22–32)
Calcium: 8.3 mg/dL — ABNORMAL LOW (ref 8.9–10.3)
Chloride: 104 mmol/L (ref 98–111)
Creatinine, Ser: 4.99 mg/dL — ABNORMAL HIGH (ref 0.61–1.24)
GFR, Estimated: 12 mL/min — ABNORMAL LOW (ref >60)
Glucose, Bld: 157 mg/dL — ABNORMAL HIGH (ref 70–99)
Potassium: 4.5 mmol/L (ref 3.5–5.1)
Sodium: 135 mmol/L (ref 135–145)

## 2021-01-03 LAB — BLOOD GAS, VENOUS
Acid-base deficit: 1.4 mmol/L (ref 0.0–2.0)
Bicarbonate: 22.6 mmol/L (ref 20.0–28.0)
FIO2: 21
O2 Saturation: 85.6 %
Patient temperature: 36.6
pCO2, Ven: 43.5 mmHg — ABNORMAL LOW (ref 44.0–60.0)
pH, Ven: 7.349 (ref 7.250–7.430)
pO2, Ven: 52 mmHg — ABNORMAL HIGH (ref 32.0–45.0)

## 2021-01-03 LAB — RESP PANEL BY RT-PCR (FLU A&B, COVID) ARPGX2
Influenza A by PCR: NEGATIVE
Influenza B by PCR: NEGATIVE
SARS Coronavirus 2 by RT PCR: NEGATIVE

## 2021-01-03 LAB — BRAIN NATRIURETIC PEPTIDE: B Natriuretic Peptide: 480 pg/mL — ABNORMAL HIGH (ref 0.0–100.0)

## 2021-01-03 MED ORDER — FLUTICASONE PROPIONATE HFA 110 MCG/ACT IN AERO
2.0000 | INHALATION_SPRAY | Freq: Two times a day (BID) | RESPIRATORY_TRACT | Status: DC
  Filled 2021-01-03: qty 12

## 2021-01-03 MED ORDER — ALBUTEROL SULFATE (2.5 MG/3ML) 0.083% IN NEBU
INHALATION_SOLUTION | RESPIRATORY_TRACT | Status: AC
  Filled 2021-01-03: qty 12

## 2021-01-03 MED ORDER — ALBUTEROL SULFATE HFA 108 (90 BASE) MCG/ACT IN AERS
2.0000 | INHALATION_SPRAY | Freq: Once | RESPIRATORY_TRACT | Status: AC
  Administered 2021-01-03: 2 via RESPIRATORY_TRACT
  Filled 2021-01-03: qty 6.7

## 2021-01-03 MED ORDER — BUDESONIDE 0.25 MG/2ML IN SUSP: 0.2500 mg | Freq: Two times a day (BID) | RESPIRATORY_TRACT | Status: DC

## 2021-01-03 MED ORDER — FLUTICASONE PROPIONATE HFA 110 MCG/ACT IN AERO: 2.0000 | INHALATION_SPRAY | Freq: Two times a day (BID) | RESPIRATORY_TRACT | Status: DC

## 2021-01-03 MED ORDER — PREDNISONE 50 MG PO TABS
60.0000 mg | ORAL_TABLET | Freq: Once | ORAL | Status: DC
  Filled 2021-01-03: qty 1

## 2021-01-03 MED ORDER — ALBUTEROL (5 MG/ML) CONTINUOUS INHALATION SOLN: 10.0000 mg/h | INHALATION_SOLUTION | Freq: Once | RESPIRATORY_TRACT | Status: DC

## 2021-01-03 MED ORDER — ALBUTEROL SULFATE (2.5 MG/3ML) 0.083% IN NEBU
10.0000 mg | INHALATION_SOLUTION | Freq: Once | RESPIRATORY_TRACT | Status: AC
  Administered 2021-01-03: 10 mg via RESPIRATORY_TRACT

## 2021-01-03 NOTE — ED Provider Notes (Signed)
[facility] EMERGENCY DEPARTMENT Provider Note   CSN: [id number] Arrival date & time: 01/03/21  1621     History Chief Complaint  Patient presents with   Shortness of Breath    Danny [name] is a 65 y.o. male.  HPI Patient with history of stage V chronic kidney disease, diabetes, anemia and heart failure presenting for shortness of breath.  He reports shortness of breath for at least a week with ongoing cough for several months, producing white sputum.  He has been told by his nephrologist that he needs dialysis.  Apparently plans are in the works for placing a dialysis catheter.  He denies fever, chills, chest pain, new leg pain, or known weight gain.  He was transferred by EMS who found him hypoxic at 87% on room air at home.  With nasal cannula supplementation oxygenation improved to 97%.  Patient has chronic lower extremity "cellulitis," which is being treated with wraps.  Home health applied new wraps today.  He does not have a history of bronchitis or COPD.  He has not had COVID vaccines.  There are no other known active modifying factors.    Past Medical History:  Diagnosis Date   Anemia    Arthritis    CKD (chronic kidney disease), stage IV ([place])    Diabetes mellitus without complication ([place])    Foot ulcer ([place])    Hypertension    Urinary retention     Patient Active Problem List   Diagnosis Date Noted   B12 deficiency 12/06/2020   Osteomyelitis of right ankle (HCC)    Cellulitis, leg 10/28/2020   CKD (chronic kidney disease) stage 4, GFR 15-29 ml/min ([place]) 10/28/2020   Moderate protein-calorie malnutrition ([place]) 10/26/2020   Medical non-compliance 10/26/2020   Chronic ulcer of right ankle ([place])    Venous stasis    Cellulitis 10/25/2020   Normocytic anemia 09/13/2020   Class 2 obesity    Respiratory failure ([place]) 03/23/2020   UTI (urinary tract infection), bacterial 01/22/2020   Acute on chronic anemia 01/16/2020   Benign prostatic hyperplasia with urinary  obstruction 01/15/2020   AKI (acute kidney injury) ([place]) 07/08/2019   Hypoxia 07/08/2019   Pressure injury of skin 07/08/2019   Edema of both lower extremities    Anasarca 03/23/2019   Bladder outlet obstruction 03/23/2019   Yeast infection of the skin 03/16/2019   Diabetic ulcer of left foot ([place]) 03/15/2019   Diabetic ulcer of ankle ([place]) 03/15/2019   Hypertension associated with stage 3 chronic kidney disease due to type 2 diabetes mellitus ([place]) 03/09/2019   Controlled type 2 diabetes mellitus with stage 3 chronic kidney disease, with long-term current use of insulin ([place]) 03/09/2019   Type 2 diabetes with nephropathy ([place]) 03/09/2019   Chronic gout due to renal impairment without tophus 03/09/2019   Emphysematous cystitis 03/05/2019   Bilateral hydronephrosis    Bilateral cellulitis of lower leg 03/03/2019   Urinary retention 03/03/2019   Klebsiella Cystitis 03/03/2019   UTI due to Klebsiella species 03/03/2019   Plantar ulcer of left foot ([place]) 03/03/2019   Acute on chronic diastolic (congestive) heart failure ([place]) 02/25/2019   Acute respiratory failure with hypoxia ([place]) 02/25/2019   Acute renal failure superimposed on stage 4 chronic kidney disease ([place]) 02/24/2019   Congestive heart failure ([place]) 02/24/2019   Dyspnea 02/23/2019   Essential hypertension 02/23/2019   Diabetes mellitus ([place]) 02/23/2019   CKD (chronic kidney disease) 02/23/2019   Psoriasis 02/23/2019    Past Surgical History:  Procedure Laterality Date   ANKLE SURGERY Right    CHOLECYSTECTOMY     FOOT SURGERY Right    TRANSURETHRAL RESECTION OF PROSTATE N/A 01/15/2020   Procedure: TRANSURETHRAL RESECTION OF THE PROSTATE (TURP)  with General anesthesia and spinal;  Surgeon: Cleon Gustin, MD;  Location: [facility];  Service: Urology;  Laterality: N/A;       Family History  Problem Relation Age of Onset   Diabetes Mother    Heart attack Mother    Heart attack Father    Diabetes Brother      Social History   Tobacco Use   Smoking status: Former    Pack years: 0.00   Smokeless tobacco: Never  Vaping Use   Vaping Use: Never used  Substance Use Topics   Alcohol use: Not Currently   Drug use: Never    Home Medications Prior to Admission medications   Medication Sig Start Date End Date Taking? Authorizing Provider  acetaminophen (TYLENOL) 500 MG tablet Take 1,000 mg by mouth every 6 (six) hours as needed for moderate pain or headache.   Yes [provider]  amLODipine (NORVASC) 10 MG tablet Take 10 mg by mouth daily.   Yes [provider]  amoxicillin-clavulanate (AUGMENTIN) 875-125 MG tablet Take 1 tablet by mouth 2 (two) times daily. One po bid x 7 days 01/04/21  Yes Daleen Bo, MD  Ascorbic Acid (VITAMIN C) 500 MG CHEW Chew 1,000 mg by mouth daily.   Yes [provider]  aspirin 325 MG tablet Take 325 mg by mouth daily.   Yes [provider]  cholecalciferol (VITAMIN D3) 25 MCG (1000 UNIT) tablet Take 1,000 Units by mouth daily.   Yes [provider]  collagenase (SANTYL) ointment Apply to right lateral malleolus once daily; apply in a 1/8 inch layer and top with saline moistened gauze 2X2. 11/05/20  Yes Barton Dubois, MD  hydrALAZINE (APRESOLINE) 100 MG tablet Take 1 tablet (100 mg total) by mouth 3 (three) times daily. Patient taking differently: Take 100 mg by mouth 2 (two) times daily. 11/05/20 11/05/21 Yes Barton Dubois, MD  metoprolol succinate (TOPROL-XL) 25 MG 24 hr tablet Take 1 tablet (25 mg total) by mouth every evening. 11/05/20  Yes Barton Dubois, MD  NOVOLIN 70/30 RELION (70-30) 100 UNIT/ML injection Inject 30 Units into the skin in the morning and at bedtime. Patient taking differently: Inject 25 Units into the skin in the morning and at bedtime. Sliding scale 11/05/20  Yes Barton Dubois, MD  Nutritional Supplements (FEEDING SUPPLEMENT, NEPRO CARB STEADY,) LIQD Take 237 mLs by mouth 2 (two) times daily between  meals. 11/05/20  Yes Barton Dubois, MD  Omega-3 Fatty Acids (FISH OIL PO) Take 2,400 mg by mouth 2 (two) times daily.   Yes [provider]  pravastatin (PRAVACHOL) 80 MG tablet Take 1 tablet (80 mg total) by mouth every evening. 03/20/19  Yes Gerlene Fee, NP  sevelamer carbonate (RENVELA) 800 MG tablet Take 1 tablet (800 mg total) by mouth 3 (three) times daily with meals. Patient taking differently: Take 800 mg by mouth 2 (two) times daily with a meal. 11/05/20  Yes Barton Dubois, MD  torsemide (DEMADEX) 20 MG tablet Take 2.5 tablets (50 mg total) by mouth 2 (two) times daily. Patient taking differently: Take 50 mg by mouth daily. 11/05/20  Yes Barton Dubois, MD  vitamin B-12 (CYANOCOBALAMIN) 1000 MCG tablet Take 1,000 mcg by mouth daily.   Yes [provider]  vitamin E 1000  UNIT capsule Take 1,000 Units by mouth daily.   Yes [provider]  Zinc 30 MG CAPS Take 1 capsule by mouth daily.   Yes [provider]  ferrous sulfate 325 (65 FE) MG tablet Take 325 mg by mouth 2 (two) times daily with a meal. Patient not taking: No sig reported    [provider]  insulin NPH-regular Human (NOVOLIN 70/30) (70-30) 100 UNIT/ML injection Inject 25 Units into the skin 2 (two) times daily with a meal. Sliding scale Patient not taking: No sig reported 09/27/16   [provider]  Lactulose 20 GM/30ML SOLN Take 30 mLs (20 g total) by mouth daily. Patient not taking: Reported on 01/03/2021 11/11/20   [name], MD  Multiple Vitamin (MULTIVITAMIN WITH MINERALS) TABS tablet Take 1 tablet by mouth daily.    [provider]  torsemide (DEMADEX) 100 MG tablet Take 50 mg by mouth 2 (two) times daily. Patient not taking: No sig reported 11/19/20   [provider]    Allergies    Dust mite extract, Prednisone, and Rocephin [ceftriaxone]  Review of Systems   Review of Systems  All other systems reviewed and are negative.  Physical  Exam Updated Vital Signs BP 137/85   Pulse (!) 108   Temp 97.8 F (36.6 C) (Oral)   Resp 13   Ht 6\' 1"  (1.854 m)   Wt 129.3 kg   SpO2 95%   BMI 37.60 kg/m   Physical Exam Vitals and nursing note reviewed.  Constitutional:      General: He is not in acute distress.    Appearance: He is well-developed. He is obese. He is not ill-appearing.  HENT:     Head: Normocephalic and atraumatic.     Right Ear: External ear normal.     Left Ear: External ear normal.  Eyes:     Conjunctiva/sclera: Conjunctivae normal.     Pupils: Pupils are equal, round, and reactive to light.  Neck:     Trachea: Phonation normal.  Cardiovascular:     Rate and Rhythm: Normal rate and regular rhythm.     Heart sounds: Normal heart sounds.  Pulmonary:     Effort: Pulmonary effort is normal.     Breath sounds: Normal breath sounds.  Abdominal:     General: There is no distension.     Palpations: Abdomen is soft.     Tenderness: There is no abdominal tenderness.  Musculoskeletal:        General: No swelling or tenderness. Normal range of motion.     Cervical back: Normal range of motion and neck supple.     Comments: No appreciable lower extremity edema.  Skin:    General: Skin is warm and dry.  Neurological:     Mental Status: He is alert and oriented to person, place, and time.     Cranial Nerves: No cranial nerve deficit.     Sensory: No sensory deficit.     Motor: No abnormal muscle tone.     Coordination: Coordination normal.  Psychiatric:        Behavior: Behavior normal.        Thought Content: Thought content normal.        Judgment: Judgment normal.    ED Results / Procedures / Treatments   Labs (all labs ordered are listed, but only abnormal results are displayed) Labs Reviewed  BLOOD GAS, VENOUS - Abnormal; Notable for the following components:      Result Value  pCO2, Ven 43.5 (*)    pO2, Ven 52.0 (*)    All other components within normal limits  BASIC METABOLIC PANEL -  Abnormal; Notable for the following components:   Glucose, Bld 157 (*)    BUN 68 (*)    Creatinine, Ser 4.99 (*)    Calcium 8.3 (*)    GFR, Estimated 12 (*)    All other components within normal limits  CBC WITH DIFFERENTIAL/PLATELET - Abnormal; Notable for the following components:   RBC 3.08 (*)    Hemoglobin 8.4 (*)    HCT 26.4 (*)    RDW 19.5 (*)    Lymphs Abs 0.6 (*)    All other components within normal limits  BRAIN NATRIURETIC PEPTIDE - Abnormal; Notable for the following components:   B Natriuretic Peptide 480.0 (*)    All other components within normal limits  RESP PANEL BY RT-PCR (FLU A&B, COVID) [id number]    EKG EKG Interpretation  Date/Time:  Monday January 03 2021 16:37:39 EDT Ventricular Rate:  81 PR Interval:  158 QRS Duration: 93 QT Interval:  408 QTC Calculation: 474 R Axis:   81 Text Interpretation: Sinus rhythm Borderline right axis deviation Nonspecific T abnormalities, lateral leads Reconfirmed by Daleen Bo [id number]) on 01/03/2021 5:56:14 PM  Radiology DG Chest Port 1 View  Result Date: 01/03/2021 CLINICAL DATA:  Shortness of breath EXAM: PORTABLE CHEST 1 VIEW COMPARISON:  November 29, 2020 FINDINGS: Stable cardiomegaly. Aortic atherosclerosis. Bibasilar opacities, favor atelectasis. No visible pleural effusion or pneumothorax. The visualized skeletal structures are unchanged. IMPRESSION: 1. Stable cardiomegaly. 2. Bibasilar opacities, favor atelectasis. 3.  Aortic Atherosclerosis (ICD10-I70.0). Electronically Signed   By: Dahlia Bailiff MD   On: 01/03/2021 18:29    Procedures Procedures   Medications Ordered in ED Medications  albuterol (PROVENTIL) (2.5 MG/3ML) 0.083% nebulizer solution (  Not Given 01/03/21 1958)  fluticasone (FLOVENT HFA) 110 MCG/ACT inhaler 2 puff (has no administration in time range)  albuterol (PROVENTIL) (2.5 MG/3ML) 0.083% nebulizer solution 10 mg (10 mg Nebulization Given 01/03/21 1919)  albuterol (VENTOLIN HFA) 108 (90 Base) MCG/ACT  inhaler 2 puff (2 puffs Inhalation Given 01/03/21 2355)    ED Course  I have reviewed the triage vital signs and the nursing notes.  Pertinent labs & imaging results that were available during my care of the patient were reviewed by me and considered in my medical decision making (see chart for details).  Clinical Course as of 01/04/21 0109  Mon Jan 03, 2021  2250 Currently oxygenation is 100% on 2 L nasal cannula.  I turned the oxygen off.  He states he feels better after being treated with 10 mg albuterol nebulizer.  He refused prednisone because it makes him nervous and causes nausea. [EW]  2328 He has been on room air for 45 minutes and his oxygen saturation is 93% on room air.  This is normal.  He is not currently having any respiratory distress. [EW]    Clinical Course User Index [EW] Daleen Bo, MD   MDM Rules/Calculators/A&P                           Patient Vitals for the past 24 hrs:  BP Temp Temp src Pulse Resp SpO2 Height Weight  01/04/21 0030 137/85 -- -- (!) 108 13 -- -- --  01/04/21 0000 117/76 -- -- (!) 111 (!) 35 95 % -- --  01/03/21 2355 -- -- -- -- -- 93 % -- --  01/03/21 2306 135/84 97.8 F (36.6 C) Oral (!) 108 (!) 21 97 % -- --  01/03/21 2230 137/81 -- -- (!) 107 (!) 23 99 % -- --  01/03/21 2130 126/63 -- -- (!) 102 13 95 % -- --  01/03/21 2113 125/73 97.6 F (36.4 C) Oral (!) 108 14 95 % -- --  01/03/21 2100 125/73 -- -- (!) 107 12 95 % -- --  01/03/21 2030 (!) 121/47 -- -- 92 16 95 % -- --  01/03/21 2000 126/75 -- -- 92 12 95 % -- --  01/03/21 1945 -- -- -- 81 14 100 % -- --  01/03/21 1930 123/66 -- -- 79 11 100 % -- --  01/03/21 1900 (!) 141/86 (!) 97.5 F (36.4 C) Oral (!) 103 15 100 % -- --  01/03/21 1845 -- -- -- 82 16 100 % -- --  01/03/21 1800 128/75 -- -- 77 12 94 % -- --  01/03/21 1755 -- -- -- 83 12 94 % -- --  01/03/21 1752 -- -- -- -- -- (!) 87 % -- --  01/03/21 1730 123/66 -- -- 83 14 90 % -- --  01/03/21 1700 133/78 -- -- 81 16 98 %  -- --  01/03/21 1656 -- -- -- -- -- 92 % -- --  01/03/21 1655 -- 97.9 F (36.6 C) Oral -- -- -- -- --  01/03/21 1636 (!) 145/82 -- -- 80 16 100 % -- --  01/03/21 1631 -- -- -- -- -- -- 6\' 1"  (1.854 m) 129.3 kg    11:29 PM Reevaluation with update and discussion. After initial assessment and treatment, an updated evaluation reveals patient states his breathing is better after the treatment.Daleen Bo   Medical Decision Making:  This patient is presenting for evaluation of shortness of breath and cough, which does require a range of treatment options, and is a complaint that involves a high risk of morbidity and mortality. The differential diagnoses include CHF, acute infection either bacterial or viral, PE. I decided to review old records, and in summary patient with known stage V kidney disease, presenting with shortness of breath and cough.  Hypoxic on room air in the field.  He is not currently on oxygen.  He has a chronic cough.  I did not additional historical information from anyone.  Clinical Laboratory Tests Ordered, included CBC, Metabolic panel, and venous gas, BMP . Review indicates normal except hemoglobin low, PCO2 low, BNP high, glucose high, BUN high, creatinine high, calcium low, GFR low. Radiologic Tests Ordered, included chest x-ray.  I independently Visualized: Radiograph images, which show atelectasis without pneumonia   Critical Interventions-clinical evaluation, laboratory testing, radiography, medication treatment, observation and reassessment.  After These Interventions, the Patient was reevaluated and was found with oxygen saturation improved to 93% after treatment.  No respiratory distress and patient reports improvement after treatment.  Mild hypoxia with sleeping, to 88% after treatment.  Patient remained stable for discharge and outpatient management.  He stated he could not get to the pharmacy quickly so I was able to obtain inhalers to send home with him,  albuterol and Pulmicort  CRITICAL CARE-no Performed by: Daleen Bo  Nursing Notes Reviewed/ Care Coordinated Applicable Imaging Reviewed Interpretation of Laboratory Data incorporated into ED treatment  The patient appears reasonably screened and/or stabilized for discharge and I doubt any other medical condition or other [facility] requiring further screening, evaluation, or treatment in the ED at this time prior to discharge.  Plan:  Home Medications-continue; Home Treatments-rest, fluid; return here if the recommended treatment, does not improve the symptoms; Recommended follow up-PCP checkup 1 week and as needed.  Follow-up with nephrologist, as scheduled     Final Clinical Impression(s) / ED Diagnoses Final diagnoses:  Bronchitis    Rx / DC Orders ED Discharge Orders          Ordered    amoxicillin-clavulanate (AUGMENTIN) 875-125 MG tablet  2 times daily        01/04/21 0107             Daleen Bo, MD 01/04/21 0109

## 2021-01-03 NOTE — ED Triage Notes (Signed)
Patient complains of cough and SHOB for one weel. 87% on RA on 6L/ 97%. Peripheral edema. Patient skipped his medications over the past couple of days. Patient denies fever. CBG 146. Hx COPD

## 2021-01-04 ENCOUNTER — Ambulatory Visit (HOSPITAL_COMMUNITY): Payer: Medicare HMO

## 2021-01-04 ENCOUNTER — Other Ambulatory Visit (HOSPITAL_COMMUNITY): Payer: Medicare HMO | Admitting: General Practice

## 2021-01-04 ENCOUNTER — Ambulatory Visit (HOSPITAL_COMMUNITY): Payer: Medicare HMO | Admitting: Physician Assistant

## 2021-01-04 ENCOUNTER — Inpatient Hospital Stay (HOSPITAL_COMMUNITY): Payer: Medicare HMO

## 2021-01-04 ENCOUNTER — Encounter (HOSPITAL_COMMUNITY): Payer: Self-pay | Admitting: General Practice

## 2021-01-04 DIAGNOSIS — E11622 Type 2 diabetes mellitus with other skin ulcer: Secondary | ICD-10-CM | POA: Diagnosis not present

## 2021-01-04 DIAGNOSIS — L97312 Non-pressure chronic ulcer of right ankle with fat layer exposed: Secondary | ICD-10-CM | POA: Diagnosis not present

## 2021-01-04 DIAGNOSIS — I130 Hypertensive heart and chronic kidney disease with heart failure and stage 1 through stage 4 chronic kidney disease, or unspecified chronic kidney disease: Secondary | ICD-10-CM | POA: Diagnosis not present

## 2021-01-04 DIAGNOSIS — L89312 Pressure ulcer of right buttock, stage 2: Secondary | ICD-10-CM | POA: Diagnosis not present

## 2021-01-04 DIAGNOSIS — E1151 Type 2 diabetes mellitus with diabetic peripheral angiopathy without gangrene: Secondary | ICD-10-CM | POA: Diagnosis not present

## 2021-01-04 DIAGNOSIS — I5032 Chronic diastolic (congestive) heart failure: Secondary | ICD-10-CM | POA: Diagnosis not present

## 2021-01-04 DIAGNOSIS — Z9181 History of falling: Secondary | ICD-10-CM | POA: Diagnosis not present

## 2021-01-04 DIAGNOSIS — N184 Chronic kidney disease, stage 4 (severe): Secondary | ICD-10-CM | POA: Diagnosis not present

## 2021-01-04 DIAGNOSIS — Z7401 Bed confinement status: Secondary | ICD-10-CM | POA: Diagnosis not present

## 2021-01-04 DIAGNOSIS — R0902 Hypoxemia: Secondary | ICD-10-CM | POA: Diagnosis not present

## 2021-01-04 DIAGNOSIS — I872 Venous insufficiency (chronic) (peripheral): Secondary | ICD-10-CM | POA: Diagnosis not present

## 2021-01-04 DIAGNOSIS — N401 Enlarged prostate with lower urinary tract symptoms: Secondary | ICD-10-CM | POA: Diagnosis not present

## 2021-01-04 DIAGNOSIS — E1122 Type 2 diabetes mellitus with diabetic chronic kidney disease: Secondary | ICD-10-CM | POA: Diagnosis not present

## 2021-01-04 MED ORDER — BUDESONIDE 180 MCG/ACT IN AEPB
2.0000 | INHALATION_SPRAY | Freq: Two times a day (BID) | RESPIRATORY_TRACT | Status: DC
  Administered 2021-01-04: 2 via RESPIRATORY_TRACT
  Filled 2021-01-04: qty 1

## 2021-01-04 MED ORDER — AMOXICILLIN-POT CLAVULANATE 875-125 MG PO TABS: 1.0000 | ORAL_TABLET | Freq: Two times a day (BID) | ORAL | 0 refills | Status: DC

## 2021-01-04 NOTE — Discharge Instructions (Addendum)
Use the albuterol inhaler 3 to 4 puffs every 3 hours as needed for trouble breathing.  Use the Pulmicort inhaler, 2 puffs, twice a day.  We sent a prescription for antibiotic to your pharmacy.  Try to get that and start taking it as soon as possible.

## 2021-01-04 NOTE — Progress Notes (Signed)
[facility] CSW Progress Notes  Call from [facility].  They are aware that he was in ED last night and their RN will make a home visit today.  They are working w patient on facility placement - he was assessed by [facility] (ALF/independent living) and found to be inappropriate for their level of care. They recommended SNF placement - patient wants to be in facility near his sister in [place].  [facility] is working w patient on this process.    Edwyna Shell, LCSW Clinical Social Worker Phone:  [phone]

## 2021-01-04 NOTE — Progress Notes (Signed)
[facility] CSW Progress Notes  Call to patient - he was in ED last night due to severe fatigue/weakness.  Reports he continues to feel weak.  Was prescribed medications on discharge from ED, these were called in to [facility].  He is unable to get to [facility] to pick them up.  Will let [facility] agency know of his need.  He is due to get dialysis catheter placed next week.  He has been assessed for electric wheelchair and this equipment is on order, no idea when he will receive it.  He is considering assisted living placement, working w the help of his [facility] agency.    Ensured he is linked w Estate manager/land agent - spoke w [facility] who has his referral.  They will attempt to connect w him today/tomorrow, can address how to pick up medications (they will get for him if there is no other alternative).  Will assess for other needs in the home including food access, transportation (using [facility] currently).    VM left for [facility] ([phone]) to ask them to connect w him re medication access and recent discharge from ED.  Edwyna Shell, LCSW Clinical Social Worker Phone:  [phone]

## 2021-01-04 NOTE — ED Notes (Signed)
Attempted to obtain fluticasone from pharmacy via the [facility]. Fluticasone is not available at this time. Attempted to obtain pulmicort from pharmacy via the [facility].

## 2021-01-06 ENCOUNTER — Ambulatory Visit (HOSPITAL_COMMUNITY): Payer: Medicare HMO

## 2021-01-06 DIAGNOSIS — L89312 Pressure ulcer of right buttock, stage 2: Secondary | ICD-10-CM | POA: Diagnosis not present

## 2021-01-06 DIAGNOSIS — E1151 Type 2 diabetes mellitus with diabetic peripheral angiopathy without gangrene: Secondary | ICD-10-CM | POA: Diagnosis not present

## 2021-01-06 DIAGNOSIS — I872 Venous insufficiency (chronic) (peripheral): Secondary | ICD-10-CM | POA: Diagnosis not present

## 2021-01-06 DIAGNOSIS — I130 Hypertensive heart and chronic kidney disease with heart failure and stage 1 through stage 4 chronic kidney disease, or unspecified chronic kidney disease: Secondary | ICD-10-CM | POA: Diagnosis not present

## 2021-01-06 DIAGNOSIS — E1122 Type 2 diabetes mellitus with diabetic chronic kidney disease: Secondary | ICD-10-CM | POA: Diagnosis not present

## 2021-01-06 DIAGNOSIS — N184 Chronic kidney disease, stage 4 (severe): Secondary | ICD-10-CM | POA: Diagnosis not present

## 2021-01-06 DIAGNOSIS — E11622 Type 2 diabetes mellitus with other skin ulcer: Secondary | ICD-10-CM | POA: Diagnosis not present

## 2021-01-06 DIAGNOSIS — L97312 Non-pressure chronic ulcer of right ankle with fat layer exposed: Secondary | ICD-10-CM | POA: Diagnosis not present

## 2021-01-06 DIAGNOSIS — N401 Enlarged prostate with lower urinary tract symptoms: Secondary | ICD-10-CM | POA: Diagnosis not present

## 2021-01-06 DIAGNOSIS — I5032 Chronic diastolic (congestive) heart failure: Secondary | ICD-10-CM | POA: Diagnosis not present

## 2021-01-07 ENCOUNTER — Other Ambulatory Visit (HOSPITAL_COMMUNITY): Payer: Medicare HMO

## 2021-01-07 ENCOUNTER — Ambulatory Visit (HOSPITAL_COMMUNITY): Payer: Medicare HMO

## 2021-01-10 ENCOUNTER — Other Ambulatory Visit (HOSPITAL_COMMUNITY)
Admission: RE | Admit: 2021-01-10 | Discharge: 2021-01-10 | Disposition: A | Discharge status: Home / Self Care | Payer: Medicare HMO | Attending: Internal Medicine | Admitting: Internal Medicine

## 2021-01-10 DIAGNOSIS — L89312 Pressure ulcer of right buttock, stage 2: Secondary | ICD-10-CM | POA: Diagnosis not present

## 2021-01-10 DIAGNOSIS — D649 Anemia, unspecified: Secondary | ICD-10-CM | POA: Insufficient documentation

## 2021-01-10 DIAGNOSIS — I132 Hypertensive heart and chronic kidney disease with heart failure and with stage 5 chronic kidney disease, or end stage renal disease: Secondary | ICD-10-CM | POA: Diagnosis not present

## 2021-01-10 DIAGNOSIS — I872 Venous insufficiency (chronic) (peripheral): Secondary | ICD-10-CM | POA: Diagnosis not present

## 2021-01-10 DIAGNOSIS — E1151 Type 2 diabetes mellitus with diabetic peripheral angiopathy without gangrene: Secondary | ICD-10-CM | POA: Diagnosis not present

## 2021-01-10 DIAGNOSIS — I130 Hypertensive heart and chronic kidney disease with heart failure and stage 1 through stage 4 chronic kidney disease, or unspecified chronic kidney disease: Secondary | ICD-10-CM | POA: Diagnosis not present

## 2021-01-10 DIAGNOSIS — E11622 Type 2 diabetes mellitus with other skin ulcer: Secondary | ICD-10-CM | POA: Diagnosis not present

## 2021-01-10 DIAGNOSIS — N184 Chronic kidney disease, stage 4 (severe): Secondary | ICD-10-CM | POA: Diagnosis not present

## 2021-01-10 DIAGNOSIS — N401 Enlarged prostate with lower urinary tract symptoms: Secondary | ICD-10-CM | POA: Diagnosis not present

## 2021-01-10 DIAGNOSIS — E1122 Type 2 diabetes mellitus with diabetic chronic kidney disease: Secondary | ICD-10-CM | POA: Diagnosis not present

## 2021-01-10 DIAGNOSIS — I5032 Chronic diastolic (congestive) heart failure: Secondary | ICD-10-CM | POA: Diagnosis not present

## 2021-01-10 DIAGNOSIS — L97312 Non-pressure chronic ulcer of right ankle with fat layer exposed: Secondary | ICD-10-CM | POA: Diagnosis not present

## 2021-01-10 LAB — CBC WITH DIFFERENTIAL/PLATELET
Abs Immature Granulocytes: 0.03 10*3/uL (ref 0.00–0.07)
Basophils Absolute: 0.1 10*3/uL (ref 0.0–0.1)
Basophils Relative: 1 %
Eosinophils Absolute: 0.3 10*3/uL (ref 0.0–0.5)
Eosinophils Relative: 4 %
HCT: 25.8 % — ABNORMAL LOW (ref 39.0–52.0)
Hemoglobin: 8.1 g/dL — ABNORMAL LOW (ref 13.0–17.0)
Immature Granulocytes: 1 %
Lymphocytes Relative: 8 %
Lymphs Abs: 0.5 10*3/uL — ABNORMAL LOW (ref 0.7–4.0)
MCH: 27.3 pg (ref 26.0–34.0)
MCHC: 31.4 g/dL (ref 30.0–36.0)
MCV: 86.9 fL (ref 80.0–100.0)
Monocytes Absolute: 0.5 10*3/uL (ref 0.1–1.0)
Monocytes Relative: 7 %
Neutro Abs: 5.2 10*3/uL (ref 1.7–7.7)
Neutrophils Relative %: 79 %
Platelets: 219 10*3/uL (ref 150–400)
RBC: 2.97 MIL/uL — ABNORMAL LOW (ref 4.22–5.81)
RDW: 20.2 % — ABNORMAL HIGH (ref 11.5–15.5)
WBC: 6.6 10*3/uL (ref 4.0–10.5)
nRBC: 0 % (ref 0.0–0.2)

## 2021-01-11 ENCOUNTER — Encounter (HOSPITAL_COMMUNITY): Payer: Self-pay | Admitting: General Practice

## 2021-01-11 NOTE — Progress Notes (Signed)
[facility] CSW Progress Notes  Call from [place] w [facility] [phone]).  She has made two visits to patient - last week, she was able to see him in person.  Yesterday, he would only come to the door.  She reports he is primarily in bed, SOB, significant difficulty ambulating.  He is now requesting SNF placement "I need some rehab."  Advised that he does not have Medicaid at this point, unclear whether [facility] or family has helped w the Medicaid application.  She would like to be connected w [facility] [place]/[facility] team in order to better coordinate care for patient's needs.  CSW will connect them.  Will also let hematology treatment team know this update.  EMT team will determine if he needs to present to ED as he is increasingly weak, SOB, fall risk.  Has fallen at home several times, requiring EMS assistance.    Edwyna Shell, LCSW Clinical Social Worker Phone:  [phone]

## 2021-01-12 ENCOUNTER — Emergency Department (HOSPITAL_COMMUNITY): Payer: Medicare HMO

## 2021-01-12 ENCOUNTER — Other Ambulatory Visit: Payer: Self-pay

## 2021-01-12 ENCOUNTER — Encounter: Payer: Medicare HMO | Admitting: Vascular Surgery

## 2021-01-12 ENCOUNTER — Encounter (HOSPITAL_COMMUNITY): Payer: Self-pay | Admitting: *Deleted

## 2021-01-12 ENCOUNTER — Inpatient Hospital Stay (HOSPITAL_COMMUNITY)
Admission: EM | Admit: 2021-01-12 | Discharge: 2021-01-27 | DRG: 291 | Disposition: A | Discharge status: Skilled Nursing Facility | Payer: Medicare HMO | Attending: Internal Medicine | Admitting: Internal Medicine

## 2021-01-12 DIAGNOSIS — N183 Chronic kidney disease, stage 3 unspecified: Secondary | ICD-10-CM | POA: Diagnosis present

## 2021-01-12 DIAGNOSIS — L97319 Non-pressure chronic ulcer of right ankle with unspecified severity: Secondary | ICD-10-CM | POA: Diagnosis present

## 2021-01-12 DIAGNOSIS — E1121 Type 2 diabetes mellitus with diabetic nephropathy: Secondary | ICD-10-CM | POA: Diagnosis not present

## 2021-01-12 DIAGNOSIS — N390 Urinary tract infection, site not specified: Secondary | ICD-10-CM | POA: Diagnosis present

## 2021-01-12 DIAGNOSIS — Z66 Do not resuscitate: Secondary | ICD-10-CM | POA: Diagnosis not present

## 2021-01-12 DIAGNOSIS — E8889 Other specified metabolic disorders: Secondary | ICD-10-CM | POA: Diagnosis present

## 2021-01-12 DIAGNOSIS — Z7189 Other specified counseling: Secondary | ICD-10-CM | POA: Diagnosis not present

## 2021-01-12 DIAGNOSIS — K5900 Constipation, unspecified: Secondary | ICD-10-CM | POA: Diagnosis not present

## 2021-01-12 DIAGNOSIS — Z79899 Other long term (current) drug therapy: Secondary | ICD-10-CM

## 2021-01-12 DIAGNOSIS — Z87891 Personal history of nicotine dependence: Secondary | ICD-10-CM

## 2021-01-12 DIAGNOSIS — Z794 Long term (current) use of insulin: Secondary | ICD-10-CM

## 2021-01-12 DIAGNOSIS — E1122 Type 2 diabetes mellitus with diabetic chronic kidney disease: Secondary | ICD-10-CM | POA: Diagnosis not present

## 2021-01-12 DIAGNOSIS — S80212A Abrasion, left knee, initial encounter: Secondary | ICD-10-CM | POA: Diagnosis present

## 2021-01-12 DIAGNOSIS — R059 Cough, unspecified: Secondary | ICD-10-CM | POA: Diagnosis not present

## 2021-01-12 DIAGNOSIS — N186 End stage renal disease: Secondary | ICD-10-CM

## 2021-01-12 DIAGNOSIS — I5032 Chronic diastolic (congestive) heart failure: Secondary | ICD-10-CM | POA: Diagnosis present

## 2021-01-12 DIAGNOSIS — I83013 Varicose veins of right lower extremity with ulcer of ankle: Secondary | ICD-10-CM | POA: Diagnosis present

## 2021-01-12 DIAGNOSIS — Z992 Dependence on renal dialysis: Secondary | ICD-10-CM | POA: Diagnosis not present

## 2021-01-12 DIAGNOSIS — Z6839 Body mass index (BMI) 39.0-39.9, adult: Secondary | ICD-10-CM | POA: Diagnosis not present

## 2021-01-12 DIAGNOSIS — N184 Chronic kidney disease, stage 4 (severe): Secondary | ICD-10-CM

## 2021-01-12 DIAGNOSIS — Z833 Family history of diabetes mellitus: Secondary | ICD-10-CM

## 2021-01-12 DIAGNOSIS — D631 Anemia in chronic kidney disease: Secondary | ICD-10-CM | POA: Diagnosis present

## 2021-01-12 DIAGNOSIS — M109 Gout, unspecified: Secondary | ICD-10-CM | POA: Diagnosis present

## 2021-01-12 DIAGNOSIS — E669 Obesity, unspecified: Secondary | ICD-10-CM | POA: Diagnosis present

## 2021-01-12 DIAGNOSIS — E119 Type 2 diabetes mellitus without complications: Secondary | ICD-10-CM

## 2021-01-12 DIAGNOSIS — E785 Hyperlipidemia, unspecified: Secondary | ICD-10-CM | POA: Diagnosis present

## 2021-01-12 DIAGNOSIS — Z8249 Family history of ischemic heart disease and other diseases of the circulatory system: Secondary | ICD-10-CM

## 2021-01-12 DIAGNOSIS — E1129 Type 2 diabetes mellitus with other diabetic kidney complication: Secondary | ICD-10-CM | POA: Diagnosis not present

## 2021-01-12 DIAGNOSIS — M1990 Unspecified osteoarthritis, unspecified site: Secondary | ICD-10-CM | POA: Diagnosis present

## 2021-01-12 DIAGNOSIS — I132 Hypertensive heart and chronic kidney disease with heart failure and with stage 5 chronic kidney disease, or end stage renal disease: Principal | ICD-10-CM | POA: Diagnosis present

## 2021-01-12 DIAGNOSIS — R0902 Hypoxemia: Secondary | ICD-10-CM | POA: Diagnosis not present

## 2021-01-12 DIAGNOSIS — G9341 Metabolic encephalopathy: Secondary | ICD-10-CM

## 2021-01-12 DIAGNOSIS — Z20822 Contact with and (suspected) exposure to covid-19: Secondary | ICD-10-CM | POA: Diagnosis not present

## 2021-01-12 DIAGNOSIS — R531 Weakness: Secondary | ICD-10-CM | POA: Diagnosis present

## 2021-01-12 DIAGNOSIS — I517 Cardiomegaly: Secondary | ICD-10-CM | POA: Diagnosis not present

## 2021-01-12 DIAGNOSIS — R339 Retention of urine, unspecified: Secondary | ICD-10-CM | POA: Diagnosis present

## 2021-01-12 DIAGNOSIS — I953 Hypotension of hemodialysis: Secondary | ICD-10-CM | POA: Diagnosis not present

## 2021-01-12 DIAGNOSIS — I878 Other specified disorders of veins: Secondary | ICD-10-CM | POA: Diagnosis present

## 2021-01-12 DIAGNOSIS — I872 Venous insufficiency (chronic) (peripheral): Secondary | ICD-10-CM | POA: Diagnosis present

## 2021-01-12 DIAGNOSIS — L03115 Cellulitis of right lower limb: Secondary | ICD-10-CM | POA: Diagnosis not present

## 2021-01-12 DIAGNOSIS — Z7982 Long term (current) use of aspirin: Secondary | ICD-10-CM

## 2021-01-12 DIAGNOSIS — J9601 Acute respiratory failure with hypoxia: Secondary | ICD-10-CM | POA: Diagnosis not present

## 2021-01-12 DIAGNOSIS — J90 Pleural effusion, not elsewhere classified: Secondary | ICD-10-CM | POA: Diagnosis not present

## 2021-01-12 DIAGNOSIS — D649 Anemia, unspecified: Secondary | ICD-10-CM | POA: Diagnosis present

## 2021-01-12 DIAGNOSIS — Z9079 Acquired absence of other genital organ(s): Secondary | ICD-10-CM

## 2021-01-12 DIAGNOSIS — J40 Bronchitis, not specified as acute or chronic: Secondary | ICD-10-CM | POA: Diagnosis present

## 2021-01-12 DIAGNOSIS — N185 Chronic kidney disease, stage 5: Secondary | ICD-10-CM | POA: Diagnosis not present

## 2021-01-12 DIAGNOSIS — I129 Hypertensive chronic kidney disease with stage 1 through stage 4 chronic kidney disease, or unspecified chronic kidney disease: Secondary | ICD-10-CM | POA: Diagnosis not present

## 2021-01-12 DIAGNOSIS — I10 Essential (primary) hypertension: Secondary | ICD-10-CM

## 2021-01-12 DIAGNOSIS — I120 Hypertensive chronic kidney disease with stage 5 chronic kidney disease or end stage renal disease: Secondary | ICD-10-CM | POA: Diagnosis not present

## 2021-01-12 DIAGNOSIS — Z751 Person awaiting admission to adequate facility elsewhere: Secondary | ICD-10-CM

## 2021-01-12 DIAGNOSIS — L03116 Cellulitis of left lower limb: Secondary | ICD-10-CM

## 2021-01-12 DIAGNOSIS — N2581 Secondary hyperparathyroidism of renal origin: Secondary | ICD-10-CM | POA: Diagnosis present

## 2021-01-12 DIAGNOSIS — I4891 Unspecified atrial fibrillation: Secondary | ICD-10-CM | POA: Diagnosis not present

## 2021-01-12 DIAGNOSIS — Z888 Allergy status to other drugs, medicaments and biological substances status: Secondary | ICD-10-CM

## 2021-01-12 DIAGNOSIS — Z881 Allergy status to other antibiotic agents status: Secondary | ICD-10-CM

## 2021-01-12 DIAGNOSIS — Z743 Need for continuous supervision: Secondary | ICD-10-CM | POA: Diagnosis not present

## 2021-01-12 DIAGNOSIS — J9811 Atelectasis: Secondary | ICD-10-CM | POA: Diagnosis not present

## 2021-01-12 HISTORY — DX: Gout, unspecified: M10.9

## 2021-01-12 HISTORY — DX: Chronic kidney disease, stage 5: N18.5

## 2021-01-12 HISTORY — DX: Unspecified diastolic (congestive) heart failure: I50.30

## 2021-01-12 LAB — RESP PANEL BY RT-PCR (FLU A&B, COVID) ARPGX2
Influenza A by PCR: NEGATIVE
Influenza B by PCR: NEGATIVE
SARS Coronavirus 2 by RT PCR: NEGATIVE

## 2021-01-12 LAB — CBC WITH DIFFERENTIAL/PLATELET
Abs Immature Granulocytes: 0.02 10*3/uL (ref 0.00–0.07)
Basophils Absolute: 0 10*3/uL (ref 0.0–0.1)
Basophils Relative: 1 %
Eosinophils Absolute: 0.3 10*3/uL (ref 0.0–0.5)
Eosinophils Relative: 5 %
HCT: 22.3 % — ABNORMAL LOW (ref 39.0–52.0)
Hemoglobin: 7 g/dL — ABNORMAL LOW (ref 13.0–17.0)
Immature Granulocytes: 0 %
Lymphocytes Relative: 9 %
Lymphs Abs: 0.7 10*3/uL (ref 0.7–4.0)
MCH: 27.6 pg (ref 26.0–34.0)
MCHC: 31.4 g/dL (ref 30.0–36.0)
MCV: 87.8 fL (ref 80.0–100.0)
Monocytes Absolute: 0.6 10*3/uL (ref 0.1–1.0)
Monocytes Relative: 8 %
Neutro Abs: 5.7 10*3/uL (ref 1.7–7.7)
Neutrophils Relative %: 77 %
Platelets: 178 10*3/uL (ref 150–400)
RBC: 2.54 MIL/uL — ABNORMAL LOW (ref 4.22–5.81)
RDW: 20.2 % — ABNORMAL HIGH (ref 11.5–15.5)
WBC: 7.4 10*3/uL (ref 4.0–10.5)
nRBC: 0 % (ref 0.0–0.2)

## 2021-01-12 LAB — COMPREHENSIVE METABOLIC PANEL
ALT: 16 U/L (ref 0–44)
AST: 18 U/L (ref 15–41)
Albumin: 2.8 g/dL — ABNORMAL LOW (ref 3.5–5.0)
Alkaline Phosphatase: 120 U/L (ref 38–126)
Anion gap: 6 (ref 5–15)
BUN: 64 mg/dL — ABNORMAL HIGH (ref 8–23)
CO2: 25 mmol/L (ref 22–32)
Calcium: 8.4 mg/dL — ABNORMAL LOW (ref 8.9–10.3)
Chloride: 104 mmol/L (ref 98–111)
Creatinine, Ser: 4.34 mg/dL — ABNORMAL HIGH (ref 0.61–1.24)
GFR, Estimated: 14 mL/min — ABNORMAL LOW (ref >60)
Glucose, Bld: 153 mg/dL — ABNORMAL HIGH (ref 70–99)
Potassium: 4.1 mmol/L (ref 3.5–5.1)
Sodium: 135 mmol/L (ref 135–145)
Total Bilirubin: 1 mg/dL (ref 0.3–1.2)
Total Protein: 7.2 g/dL (ref 6.5–8.1)

## 2021-01-12 LAB — HEMOGLOBIN A1C
Hgb A1c MFr Bld: 6.8 % — ABNORMAL HIGH (ref 4.8–5.6)
Mean Plasma Glucose: 148.46 mg/dL

## 2021-01-12 LAB — PREALBUMIN: Prealbumin: 14.1 mg/dL — ABNORMAL LOW (ref 18–38)

## 2021-01-12 LAB — TROPONIN I (HIGH SENSITIVITY)
Troponin I (High Sensitivity): 13 ng/L (ref <18)
Troponin I (High Sensitivity): 13 ng/L (ref <18)

## 2021-01-12 LAB — POC OCCULT BLOOD, ED: Fecal Occult Bld: NEGATIVE

## 2021-01-12 LAB — GLUCOSE, CAPILLARY: Glucose-Capillary: 141 mg/dL — ABNORMAL HIGH (ref 70–99)

## 2021-01-12 LAB — BRAIN NATRIURETIC PEPTIDE: B Natriuretic Peptide: 329 pg/mL — ABNORMAL HIGH (ref 0.0–100.0)

## 2021-01-12 LAB — PREPARE RBC (CROSSMATCH)

## 2021-01-12 MED ORDER — SODIUM CHLORIDE 0.9% IV SOLUTION: Freq: Once | INTRAVENOUS | Status: AC

## 2021-01-12 MED ORDER — SEVELAMER CARBONATE 800 MG PO TABS
800.0000 mg | ORAL_TABLET | Freq: Two times a day (BID) | ORAL | Status: DC
  Administered 2021-01-12 – 2021-01-27 (×28): 800 mg via ORAL
  Filled 2021-01-12 (×28): qty 1

## 2021-01-12 MED ORDER — VITAMIN B-12 1000 MCG PO TABS
1000.0000 ug | ORAL_TABLET | Freq: Every day | ORAL | Status: DC
  Administered 2021-01-12 – 2021-01-27 (×16): 1000 ug via ORAL
  Filled 2021-01-12 (×16): qty 1

## 2021-01-12 MED ORDER — ACETAMINOPHEN 650 MG RE SUPP: 650.0000 mg | Freq: Four times a day (QID) | RECTAL | Status: DC | PRN

## 2021-01-12 MED ORDER — METOPROLOL SUCCINATE ER 25 MG PO TB24
25.0000 mg | ORAL_TABLET | Freq: Every evening | ORAL | Status: DC
  Administered 2021-01-12 – 2021-01-15 (×4): 25 mg via ORAL
  Filled 2021-01-12 (×4): qty 1

## 2021-01-12 MED ORDER — HYDRALAZINE HCL 25 MG PO TABS
100.0000 mg | ORAL_TABLET | Freq: Two times a day (BID) | ORAL | Status: DC
  Administered 2021-01-12 – 2021-01-15 (×6): 100 mg via ORAL
  Filled 2021-01-12 (×8): qty 4

## 2021-01-12 MED ORDER — INSULIN ASPART PROT & ASPART (70-30 MIX) 100 UNIT/ML ~~LOC~~ SUSP
12.5000 [IU] | Freq: Two times a day (BID) | SUBCUTANEOUS | Status: DC
  Administered 2021-01-13 – 2021-01-16 (×6): 13 [IU] via SUBCUTANEOUS
  Filled 2021-01-12: qty 10

## 2021-01-12 MED ORDER — ASPIRIN 325 MG PO TABS
325.0000 mg | ORAL_TABLET | Freq: Every day | ORAL | Status: DC
  Administered 2021-01-12 – 2021-01-21 (×10): 325 mg via ORAL
  Filled 2021-01-12 (×10): qty 1

## 2021-01-12 MED ORDER — INSULIN ASPART 100 UNIT/ML IJ SOLN
0.0000 [IU] | Freq: Three times a day (TID) | INTRAMUSCULAR | Status: DC
  Administered 2021-01-13 (×2): 2 [IU] via SUBCUTANEOUS
  Administered 2021-01-13: 1 [IU] via SUBCUTANEOUS
  Administered 2021-01-15: 5 [IU] via SUBCUTANEOUS
  Administered 2021-01-15: 1 [IU] via SUBCUTANEOUS
  Administered 2021-01-15: 2 [IU] via SUBCUTANEOUS
  Administered 2021-01-16: 1 [IU] via SUBCUTANEOUS
  Administered 2021-01-16: 2 [IU] via SUBCUTANEOUS
  Administered 2021-01-16: 1 [IU] via SUBCUTANEOUS
  Administered 2021-01-17: 2 [IU] via SUBCUTANEOUS
  Administered 2021-01-21 – 2021-01-24 (×5): 1 [IU] via SUBCUTANEOUS
  Administered 2021-01-25: 2 [IU] via SUBCUTANEOUS
  Administered 2021-01-25 – 2021-01-26 (×2): 1 [IU] via SUBCUTANEOUS

## 2021-01-12 MED ORDER — ZINC SULFATE 220 (50 ZN) MG PO CAPS
220.0000 mg | ORAL_CAPSULE | Freq: Every day | ORAL | Status: DC
  Administered 2021-01-12 – 2021-01-27 (×16): 220 mg via ORAL
  Filled 2021-01-12 (×16): qty 1

## 2021-01-12 MED ORDER — AMLODIPINE BESYLATE 5 MG PO TABS
10.0000 mg | ORAL_TABLET | Freq: Every day | ORAL | Status: DC
  Administered 2021-01-13 – 2021-01-15 (×3): 10 mg via ORAL
  Filled 2021-01-12 (×4): qty 2

## 2021-01-12 MED ORDER — HEPARIN SODIUM (PORCINE) 5000 UNIT/ML IJ SOLN
5000.0000 [IU] | Freq: Three times a day (TID) | INTRAMUSCULAR | Status: DC
  Administered 2021-01-12 – 2021-01-19 (×13): 5000 [IU] via SUBCUTANEOUS
  Filled 2021-01-12 (×15): qty 1

## 2021-01-12 MED ORDER — VITAMIN E 180 MG (400 UNIT) PO CAPS
800.0000 [IU] | ORAL_CAPSULE | Freq: Every day | ORAL | Status: DC
  Administered 2021-01-12 – 2021-01-27 (×16): 800 [IU] via ORAL
  Filled 2021-01-12 (×19): qty 2

## 2021-01-12 MED ORDER — NEPRO/CARBSTEADY PO LIQD
237.0000 mL | Freq: Two times a day (BID) | ORAL | Status: DC
  Administered 2021-01-13: 237 mL via ORAL
  Filled 2021-01-12 (×2): qty 237

## 2021-01-12 MED ORDER — ONDANSETRON HCL 4 MG PO TABS: 4.0000 mg | ORAL_TABLET | Freq: Four times a day (QID) | ORAL | Status: DC | PRN

## 2021-01-12 MED ORDER — TORSEMIDE 20 MG PO TABS
50.0000 mg | ORAL_TABLET | Freq: Two times a day (BID) | ORAL | Status: DC
  Administered 2021-01-12 – 2021-01-16 (×8): 50 mg via ORAL
  Filled 2021-01-12 (×8): qty 3

## 2021-01-12 MED ORDER — HYDRALAZINE HCL 20 MG/ML IJ SOLN: 5.0000 mg | INTRAMUSCULAR | Status: DC | PRN

## 2021-01-12 MED ORDER — METHOCARBAMOL 1000 MG/10ML IJ SOLN
500.0000 mg | Freq: Four times a day (QID) | INTRAVENOUS | Status: DC | PRN
  Filled 2021-01-12: qty 5

## 2021-01-12 MED ORDER — VITAMIN D 25 MCG (1000 UNIT) PO TABS
1000.0000 [IU] | ORAL_TABLET | Freq: Every day | ORAL | Status: DC
  Administered 2021-01-12 – 2021-01-27 (×16): 1000 [IU] via ORAL
  Filled 2021-01-12 (×16): qty 1

## 2021-01-12 MED ORDER — ACETAMINOPHEN 325 MG PO TABS
650.0000 mg | ORAL_TABLET | Freq: Four times a day (QID) | ORAL | Status: DC | PRN
  Administered 2021-01-16 – 2021-01-26 (×5): 650 mg via ORAL
  Filled 2021-01-12 (×4): qty 2

## 2021-01-12 MED ORDER — AMOXICILLIN-POT CLAVULANATE 500-125 MG PO TABS
500.0000 mg | ORAL_TABLET | Freq: Two times a day (BID) | ORAL | Status: AC
  Administered 2021-01-12 – 2021-01-13 (×3): 500 mg via ORAL
  Filled 2021-01-12 (×3): qty 1

## 2021-01-12 MED ORDER — ONDANSETRON HCL 4 MG/2ML IJ SOLN: 4.0000 mg | Freq: Four times a day (QID) | INTRAMUSCULAR | Status: DC | PRN

## 2021-01-12 MED ORDER — PRAVASTATIN SODIUM 40 MG PO TABS
80.0000 mg | ORAL_TABLET | Freq: Every evening | ORAL | Status: DC
  Administered 2021-01-12 – 2021-01-26 (×14): 80 mg via ORAL
  Filled 2021-01-12 (×14): qty 2

## 2021-01-12 NOTE — ED Provider Notes (Signed)
  Face-to-face evaluation   History: The patient was brought in by EMS after a home health worker suggested he come here to be admitted for 3-day stay then placed in rehab.  EMS reported that the patient is "unable to get out of bed."  Patient's major complaint is that he has "no energy."  He states that this makes it difficult for him to even get out of bed and into his moving wheelchair.  He states that he has food in his home.  He is taking his usual prescribed medications.  He has been seeing his PCP.  He states his leg wounds were wrapped today by the home health service.  Physical exam: Obese, elderly male, who is alert.  He is angry.  No dysarthria or aphasia.  No respiratory distress.  Medical screening examination/treatment/procedure(s) were conducted as a shared visit with non-physician practitioner(s) and myself.  I personally evaluated the patient during the encounter    Daleen Bo, MD 01/13/21 1556

## 2021-01-12 NOTE — ED Notes (Signed)
Tolerating blood product well.

## 2021-01-12 NOTE — ED Triage Notes (Signed)
Pt brought in by [facility] from home with c/o generalized weakness for awhile now. Community paramedic has been visiting pt quite a few times recently and feels that pt needs rehab because he is now completely bed bound and unable to care for himself. EMS reports pt was covered in urine when they arrived at his house. EMS even reports pt is unable to get up to transfer to wheelchair and cannot even lock his door to his home any longer.

## 2021-01-12 NOTE — Progress Notes (Signed)
TOC consulted for SNF placement. CSW informed ED staff that PT will need to see pt before we can work on SNF referral. TOC to follow.

## 2021-01-12 NOTE — H&P (Signed)
History and Physical    [name] BJY:[id number] DOB: 1955/12/31 DOA: 01/12/2021  PCP: Celene Squibb, MD Patient coming from: home  Chief Complaint: weakness  HPI: Danny [name] is a 65 y.o. male with medical history significant of Anemia of Chronic disease, CKD stage 5, DM, Chronic LE ulcers from venous stasis dermatitis and DM, HTN, Urinary retention. Pt states he has been an overall physical decline for several days w/o acute complaint. On day of admission pt unable to ambulate from bed and care for self. Profound generalized weakness. Denies CP, SOB, palpitations, ABD pain, dysuria, Frequency, rash, n/v, HA, fever, Cough, hematochezia, melena. Still on Augmentin for Bronchitis that has essentially resolved per pt. Started on 01/04/21. Leg wounds (chronic) were wrapped by home health aid on 01/10/21 per pt. No change in medication recenlyt and endorses compliance.    ED Course: Objective findings below. Started on 2 units PRBC infusion  Review of Systems: As per HPI otherwise all other systems reviewed and are negative  Ambulatory Status:non-ambulatory  Past Medical History:  Diagnosis Date   Anemia    Arthritis    CKD (chronic kidney disease), stage IV (HCC)    Diabetes mellitus without complication (HCC)    Diastolic congestive heart failure (HCC)    Foot ulcer ([place])    Gout    Hypertension    Urinary retention     Past Surgical History:  Procedure Laterality Date   ANKLE SURGERY Right    CHOLECYSTECTOMY     FOOT SURGERY Right    TRANSURETHRAL RESECTION OF PROSTATE N/A 01/15/2020   Procedure: TRANSURETHRAL RESECTION OF THE PROSTATE (TURP)  with General anesthesia and spinal;  Surgeon: Cleon Gustin, MD;  Location: [facility];  Service: Urology;  Laterality: N/A;    Social History   Socioeconomic History   Marital status: Divorced    Spouse name: Not on file   Number of children: Not on file   Years of education: Not on file   Highest education level: Not on file   Occupational History   Not on file  Tobacco Use   Smoking status: Former   Smokeless tobacco: Never  Vaping Use   Vaping Use: Never used  Substance and Sexual Activity   Alcohol use: Not Currently   Drug use: Never   Sexual activity: Not Currently  Other Topics Concern   Not on file  Social History Narrative   Not on file   Social Determinants of Health   Financial Resource Strain: Low Risk    Difficulty of Paying Living Expenses: Not very hard  Food Insecurity: No Food Insecurity   Worried About Running Out of Food in the Last Year: Never true   [place] in the Last Year: Never true  Transportation Needs: No Transportation Needs   Lack of Transportation (Medical): No   Lack of Transportation (Non-Medical): No  Physical Activity: Inactive   Days of Exercise per Week: 0 days   Minutes of Exercise per Session: 0 min  Stress: No Stress Concern Present   Feeling of Stress : Only a little  Social Connections: Not on file  Intimate Partner Violence: Not At Risk   Fear of Current or Ex-Partner: No   Emotionally Abused: No   Physically Abused: No   Sexually Abused: No    Allergies  Allergen Reactions   Dust Mite Extract Itching and Other (See Comments)    Unknown reaction-potential shortness of breath   Prednisone Nausea And Vomiting  Rocephin [Ceftriaxone] Nausea And Vomiting    Family History  Problem Relation Age of Onset   Diabetes Mother    Heart attack Mother    Heart attack Father    Diabetes Brother       Prior to Admission medications   Medication Sig Start Date End Date Taking? Authorizing Provider  acetaminophen (TYLENOL) 500 MG tablet Take 1,000 mg by mouth every 6 (six) hours as needed for moderate pain or headache.    [provider]  amLODipine (NORVASC) 10 MG tablet Take 10 mg by mouth daily.    [provider]  amoxicillin-clavulanate (AUGMENTIN) 875-125 MG tablet Take 1 tablet by mouth 2 (two) times daily. One po bid x 7  days 01/04/21   Daleen Bo, MD  Ascorbic Acid (VITAMIN C) 500 MG CHEW Chew 1,000 mg by mouth daily.    [provider]  aspirin 325 MG tablet Take 325 mg by mouth daily.    [provider]  cholecalciferol (VITAMIN D3) 25 MCG (1000 UNIT) tablet Take 1,000 Units by mouth daily.    [provider]  collagenase (SANTYL) ointment Apply to right lateral malleolus once daily; apply in a 1/8 inch layer and top with saline moistened gauze 2X2. 11/05/20   Barton Dubois, MD  ferrous sulfate 325 (65 FE) MG tablet Take 325 mg by mouth 2 (two) times daily with a meal. Patient not taking: No sig reported    [provider]  hydrALAZINE (APRESOLINE) 100 MG tablet Take 1 tablet (100 mg total) by mouth 3 (three) times daily. Patient taking differently: Take 100 mg by mouth 2 (two) times daily. 11/05/20 11/05/21  Barton Dubois, MD  insulin NPH-regular Human (NOVOLIN 70/30) (70-30) 100 UNIT/ML injection Inject 25 Units into the skin 2 (two) times daily with a meal. Sliding scale Patient not taking: No sig reported 09/27/16   [provider]  Lactulose 20 GM/30ML SOLN Take 30 mLs (20 g total) by mouth daily. Patient not taking: Reported on 01/03/2021 11/11/20   [name], MD  metoprolol succinate (TOPROL-XL) 25 MG 24 hr tablet Take 1 tablet (25 mg total) by mouth every evening. 11/05/20   Barton Dubois, MD  Multiple Vitamin (MULTIVITAMIN WITH MINERALS) TABS tablet Take 1 tablet by mouth daily.    [provider]  NOVOLIN 70/30 RELION (70-30) 100 UNIT/ML injection Inject 30 Units into the skin in the morning and at bedtime. Patient taking differently: Inject 25 Units into the skin in the morning and at bedtime. Sliding scale 11/05/20   Barton Dubois, MD  Nutritional Supplements (FEEDING SUPPLEMENT, NEPRO CARB STEADY,) LIQD Take 237 mLs by mouth 2 (two) times daily between meals. 11/05/20   Barton Dubois, MD  Omega-3 Fatty Acids (FISH OIL PO) Take 2,400 mg  by mouth 2 (two) times daily.    [provider]  pravastatin (PRAVACHOL) 80 MG tablet Take 1 tablet (80 mg total) by mouth every evening. 03/20/19   Gerlene Fee, NP  sevelamer carbonate (RENVELA) 800 MG tablet Take 1 tablet (800 mg total) by mouth 3 (three) times daily with meals. Patient taking differently: Take 800 mg by mouth 2 (two) times daily with a meal. 11/05/20   Barton Dubois, MD  torsemide (DEMADEX) 100 MG tablet Take 50 mg by mouth 2 (two) times daily. Patient not taking: No sig reported 11/19/20   [provider]  torsemide (DEMADEX) 20 MG tablet Take 2.5 tablets (50 mg total) by mouth 2 (two) times daily. Patient taking  differently: Take 50 mg by mouth daily. 11/05/20   Barton Dubois, MD  vitamin B-12 (CYANOCOBALAMIN) 1000 MCG tablet Take 1,000 mcg by mouth daily.    [provider]  vitamin E 1000 UNIT capsule Take 1,000 Units by mouth daily.    [provider]  Zinc 30 MG CAPS Take 1 capsule by mouth daily.    [provider]    Physical Exam: Vitals:   01/12/21 1230 01/12/21 1300 01/12/21 1600 01/12/21 1630  BP: 127/69 127/74 127/81 129/75  Pulse: 95 90 79 76  Resp: 18 16 16 15   Temp:   97.7 F (36.5 C) 98 F (36.7 C)  TempSrc:      SpO2: 99% 95% 98% 96%  Weight:      Height:         General: Elderly and appears ill.  Eyes:  PERRL, EOMI, normal lids, iris ENT: Dry MM grossly normal hearing, lips & tongue Neck:  no LAD, masses or thyromegaly Cardiovascular:  RRR, III/VI systolic murmur. 2+ peripheral pulses  Respiratory:  CTA bilaterally, no w/r/r. Normal respiratory effort. Abdomen: Obese, soft, ntnd, NABS Skin: LE bilat wrapped in clearn dressings.  no rash or induration seen on limited exam Musculoskeletal: poor physical effort but symmetrical. no bony abnormality Psychiatric:  grossly normal mood and affect, speech fluent and appropriate, AOx3 Neurologic:  CN 2-12 grossly intact, moves all extremities in  coordinated fashion,Complete loss of sensation in LE digits.   Labs on Admission: I have personally reviewed following labs and imaging studies  CBC: Recent Labs  Lab 01/10/21 1017 01/12/21 1202  WBC 6.6 7.4  NEUTROABS 5.2 5.7  HGB 8.1* 7.0*  HCT 25.8* 22.3*  MCV 86.9 87.8  PLT 219 903   Basic Metabolic Panel: Recent Labs  Lab 01/12/21 1202  NA 135  K 4.1  CL 104  CO2 25  GLUCOSE 153*  BUN 64*  CREATININE 4.34*  CALCIUM 8.4*   GFR: Estimated Creatinine Clearance: 23.3 mL/min (A) (by C-G formula based on SCr of 4.34 mg/dL (H)). Liver Function Tests: Recent Labs  Lab 01/12/21 1202  AST 18  ALT 16  ALKPHOS 120  BILITOT 1.0  PROT 7.2  ALBUMIN 2.8*   No results for input(s): LIPASE, AMYLASE in the last 168 hours. No results for input(s): AMMONIA in the last 168 hours. Coagulation Profile: No results for input(s): INR, PROTIME in the last 168 hours. Cardiac Enzymes: No results for input(s): CKTOTAL, CKMB, CKMBINDEX, TROPONINI in the last 168 hours. BNP (last 3 results) No results for input(s): PROBNP in the last 8760 hours. HbA1C: No results for input(s): HGBA1C in the last 72 hours. CBG: No results for input(s): GLUCAP in the last 168 hours. Lipid Profile: No results for input(s): CHOL, HDL, LDLCALC, TRIG, CHOLHDL, LDLDIRECT in the last 72 hours. Thyroid Function Tests: No results for input(s): TSH, T4TOTAL, FREET4, T3FREE, THYROIDAB in the last 72 hours. Anemia Panel: No results for input(s): VITAMINB12, FOLATE, FERRITIN, TIBC, IRON, RETICCTPCT in the last 72 hours. Urine analysis:    Component Value Date/Time   COLORURINE YELLOW 11/29/2020 2134   APPEARANCEUR CLEAR 11/29/2020 2134   APPEARANCEUR Clear 09/10/2020 1122   LABSPEC 1.008 11/29/2020 2134   PHURINE 7.0 11/29/2020 2134   GLUCOSEU >=500 (A) 11/29/2020 2134   HGBUR NEGATIVE 11/29/2020 2134   BILIRUBINUR NEGATIVE 11/29/2020 2134   BILIRUBINUR Negative 09/10/2020 [place]  11/29/2020 2134   PROTEINUR 100 (A) 11/29/2020 2134   NITRITE NEGATIVE 11/29/2020 2134  LEUKOCYTESUR NEGATIVE 11/29/2020 2134    Creatinine Clearance: Estimated Creatinine Clearance: 23.3 mL/min (A) (by C-G formula based on SCr of 4.34 mg/dL (H)).  Sepsis Labs: @LABRCNTIP (procalcitonin:4,lacticidven:4) ) Recent Results (from the past 240 hour(s))  Resp Panel by RT-PCR (Flu A&B, Covid) Nasopharyngeal Swab     Status: None   Collection Time: 01/03/21  5:07 PM   Specimen: Nasopharyngeal Swab; Nasopharyngeal(NP) swabs in vial transport medium  Result Value Ref Range Status   SARS Coronavirus 2 by RT PCR NEGATIVE NEGATIVE Final    Comment: (NOTE) SARS-CoV-2 target nucleic acids are NOT DETECTED.  The SARS-CoV-2 RNA is generally detectable in upper respiratory specimens during the acute phase of infection. The lowest concentration of SARS-CoV-2 viral copies this assay can detect is 138 copies/mL. A negative result does not preclude SARS-Cov-2 infection and should not be used as the sole basis for treatment or other patient management decisions. A negative result may occur with  improper specimen collection/handling, submission of specimen other than nasopharyngeal swab, presence of viral mutation(s) within the areas targeted by this assay, and inadequate number of viral copies(<138 copies/mL). A negative result must be combined with clinical observations, patient history, and epidemiological information. The expected result is Negative.  Fact Sheet for Patients:  EntrepreneurPulse.com.au  Fact Sheet for Healthcare Providers:  IncredibleEmployment.be  This test is no t yet approved or cleared by the [place] FDA and  has been authorized for detection and/or diagnosis of SARS-CoV-2 by FDA under an Emergency Use Authorization (EUA). This EUA will remain  in effect (meaning this test can be used) for the duration of the COVID-19 declaration  under Section 564(b)(1) of the Act, 21 U.S.C.section 360bbb-3(b)(1), unless the authorization is terminated  or revoked sooner.       Influenza A by PCR NEGATIVE NEGATIVE Final   Influenza B by PCR NEGATIVE NEGATIVE Final    Comment: (NOTE) The Xpert Xpress SARS-CoV-2/FLU/RSV plus assay is intended as an aid in the diagnosis of influenza from Nasopharyngeal swab specimens and should not be used as a sole basis for treatment. Nasal washings and aspirates are unacceptable for Xpert Xpress SARS-CoV-2/FLU/RSV testing.  Fact Sheet for Patients: EntrepreneurPulse.com.au  Fact Sheet for Healthcare Providers: IncredibleEmployment.be  This test is not yet approved or cleared by the [place] FDA and has been authorized for detection and/or diagnosis of SARS-CoV-2 by FDA under an Emergency Use Authorization (EUA). This EUA will remain in effect (meaning this test can be used) for the duration of the COVID-19 declaration under Section 564(b)(1) of the Act, 21 U.S.C. section 360bbb-3(b)(1), unless the authorization is terminated or revoked.  Performed at [facility], [address] [place], [place] [address]   Resp Panel by RT-PCR (Flu A&B, Covid) Nasopharyngeal Swab     Status: None   Collection Time: 01/12/21 12:11 PM   Specimen: Nasopharyngeal Swab; Nasopharyngeal(NP) swabs in vial transport medium  Result Value Ref Range Status   SARS Coronavirus 2 by RT PCR NEGATIVE NEGATIVE Final    Comment: (NOTE) SARS-CoV-2 target nucleic acids are NOT DETECTED.  The SARS-CoV-2 RNA is generally detectable in upper respiratory specimens during the acute phase of infection. The lowest concentration of SARS-CoV-2 viral copies this assay can detect is 138 copies/mL. A negative result does not preclude SARS-Cov-2 infection and should not be used as the sole basis for treatment or other patient management decisions. A negative result may occur with  improper  specimen collection/handling, submission of specimen other than nasopharyngeal swab, presence of viral  mutation(s) within the areas targeted by this assay, and inadequate number of viral copies(<138 copies/mL). A negative result must be combined with clinical observations, patient history, and epidemiological information. The expected result is Negative.  Fact Sheet for Patients:  EntrepreneurPulse.com.au  Fact Sheet for Healthcare Providers:  IncredibleEmployment.be  This test is no t yet approved or cleared by the [place] FDA and  has been authorized for detection and/or diagnosis of SARS-CoV-2 by FDA under an Emergency Use Authorization (EUA). This EUA will remain  in effect (meaning this test can be used) for the duration of the COVID-19 declaration under Section 564(b)(1) of the Act, 21 U.S.C.section 360bbb-3(b)(1), unless the authorization is terminated  or revoked sooner.       Influenza A by PCR NEGATIVE NEGATIVE Final   Influenza B by PCR NEGATIVE NEGATIVE Final    Comment: (NOTE) The Xpert Xpress SARS-CoV-2/FLU/RSV plus assay is intended as an aid in the diagnosis of influenza from Nasopharyngeal swab specimens and should not be used as a sole basis for treatment. Nasal washings and aspirates are unacceptable for Xpert Xpress SARS-CoV-2/FLU/RSV testing.  Fact Sheet for Patients: EntrepreneurPulse.com.au  Fact Sheet for Healthcare Providers: IncredibleEmployment.be  This test is not yet approved or cleared by the [place] FDA and has been authorized for detection and/or diagnosis of SARS-CoV-2 by FDA under an Emergency Use Authorization (EUA). This EUA will remain in effect (meaning this test can be used) for the duration of the COVID-19 declaration under Section 564(b)(1) of the Act, 21 U.S.C. section 360bbb-3(b)(1), unless the authorization is terminated or revoked.  Performed at  [facility], [address], [place], [place] [address]      Radiological Exams on Admission: DG Chest Portable 1 View  Result Date: 01/12/2021 CLINICAL DATA:  Weakness. EXAM: PORTABLE CHEST 1 VIEW COMPARISON:  January 03, 2021. FINDINGS: Stable cardiomegaly. No pneumothorax or pleural effusion is noted. Stable bibasilar subsegmental atelectasis. Bony thorax is unremarkable. IMPRESSION: Stable bibasilar subsegmental atelectasis. Electronically Signed   By: Marijo Conception M.D.   On: 01/12/2021 12:11    EKG: Independently reviewed. Sinus, Tachy, No ACS  Assessment/Plan Principal Problem:   Symptomatic anemia Active Problems:   Diabetes mellitus (HCC)   Chronic diastolic CHF (congestive heart failure) (HCC)   Bilateral cellulitis of lower leg   Urinary retention   Hypertension associated with stage 3 chronic kidney disease due to type 2 diabetes mellitus (HCC)   Acute on chronic anemia    Symptomatic Acute on Chronic anemia of chronic disease: Hgb 7. Baseline 8-9. Associated w/ marked worsenign in physical condition. No overt GI source of Hgb loss. On Retacrit infusions w/o apparent improvement. EDP ordered 2 units PRBC - f/u Hgb in am - continue mgt through H/O and Renal in the outpt setting. No further workup needed at this time.   Physical deconditioning: Multifactorial - anemia, inactivity, multiple chronic/advanced disease processes. Despite pts obesity suspect protein calorie malnutrition - PT/OT - Prealbumin - CSW for placement. Pt not likely to be able to care for self at home. Of note pt has been placed at SNF in past.   CKD stage 5: Cr 4.3. Near baseline. GFR 14. Per pt scheduled for fistula placement on day of admission. Continues to make urine.  - Monitor I/O - Will consult Renal (placed through [facility]) for assistance w/ setting pt up for AVF/AVG placement again. Pt at high risk to be lost to follow up. May be ok to happen during admission - Continue renal  Vit.  DM:  -  SSI - reduce Novolog 70/30 by half.   HTN: - continue home Norvasc, Hydralazine, Metop  dCHF: No acute decompensation. Echo 10/2020 showing 55 EF and diastolic dysfunction - I/O - continue diuresis.  - fluid restricted renal diet - continue torsemide  Bilat LE wounds: chronic. Dressings placed on 7/18 and clean appearing at time of admission. On Augmentin for Bronchitis - wound care per team  Bronchitis: started on Augmentin on 7/11 by [facility] ED[facility]. Sx resolved per pt.  - complete Augmentin - last dose due 01/13/21.       DVT prophylaxis: hep  Code Status: full  Family Communication: none  Disposition Plan: pending pt and ot eval and CSW for placement and improvement in Hgb. May need AVF/AVG placement prior to Dc.   Consults called: renal  Admission status: inpt    Waldemar Dickens MD [facility]  If 7PM-7AM, please contact night-coverage [email or web address] Password [facility]  01/12/2021, 5:17 PM

## 2021-01-12 NOTE — ED Notes (Signed)
Checked male purewick still in place no urine at this time

## 2021-01-12 NOTE — ED Notes (Signed)
Repositioned bed so pt could watch tv ,dimed lights

## 2021-01-12 NOTE — ED Provider Notes (Signed)
[facility] EMERGENCY DEPARTMENT Provider Note   CSN: [id number] Arrival date & time: 01/12/21  1025     History Chief Complaint  Patient presents with   Weakness    Danny [name] is a 65 y.o. male.   Weakness Associated symptoms: no abdominal pain, no arthralgias, no chest pain, no dizziness, no dysuria, no fever, no headaches, no myalgias, no nausea, no shortness of breath and no vomiting        Danny [name] is a 65 y.o. male with past medical history of CKD, type 2 diabetes, hypertension and anemia who presents to the Emergency Department from home requesting evaluation of generalized weakness.  He states his symptoms have gradually been progressing for 3 months.  He states that he lives alone and does not have family members to help care for him.  He uses his wheelchair to move around in his home.  He states he can no longer stand to transfer from wheelchair to bed or stand to prepare meals.  He was scheduled to have a dialysis catheter placed today to initiate hemodialysis.  He states that home health nurse has been checking his hemoglobin levels and 2 days ago his hemoglobin was 8.1.  He states there has been a gradual decline in his hemoglobin.  No history of blood transfusions or known GI bleed.  Denies any excessive aspirin or ibuprofen use.  Home health is also dressing wounds to both lower legs and is currently being treated for cellulitis.  He is also requesting assistance with nursing home placement as he is no longer able to care for himself.  He denies chest pain, shortness of breath, abdominal pain, nausea or vomiting, fever or chills.   Past Medical History:  Diagnosis Date   Anemia    Arthritis    CKD (chronic kidney disease), stage IV (HCC)    Diabetes mellitus without complication ([place])    Foot ulcer ([place])    Hypertension    Urinary retention     Patient Active Problem List   Diagnosis Date Noted   B12 deficiency 12/06/2020   Osteomyelitis of right ankle (HCC)     Cellulitis, leg 10/28/2020   CKD (chronic kidney disease) stage 4, GFR 15-29 ml/min (HCC) 10/28/2020   Moderate protein-calorie malnutrition ([place]) 10/26/2020   Medical non-compliance 10/26/2020   Chronic ulcer of right ankle ([place])    Venous stasis    Cellulitis 10/25/2020   Normocytic anemia 09/13/2020   Class 2 obesity    Respiratory failure ([place]) 03/23/2020   UTI (urinary tract infection), bacterial 01/22/2020   Acute on chronic anemia 01/16/2020   Benign prostatic hyperplasia with urinary obstruction 01/15/2020   AKI (acute kidney injury) ([place]) 07/08/2019   Hypoxia 07/08/2019   Pressure injury of skin 07/08/2019   Edema of both lower extremities    Anasarca 03/23/2019   Bladder outlet obstruction 03/23/2019   Yeast infection of the skin 03/16/2019   Diabetic ulcer of left foot ([place]) 03/15/2019   Diabetic ulcer of ankle ([place]) 03/15/2019   Hypertension associated with stage 3 chronic kidney disease due to type 2 diabetes mellitus ([place]) 03/09/2019   Controlled type 2 diabetes mellitus with stage 3 chronic kidney disease, with long-term current use of insulin ([place]) 03/09/2019   Type 2 diabetes with nephropathy ([place]) 03/09/2019   Chronic gout due to renal impairment without tophus 03/09/2019   Emphysematous cystitis 03/05/2019   Bilateral hydronephrosis    Bilateral cellulitis of lower leg 03/03/2019   Urinary retention 03/03/2019  Klebsiella Cystitis 03/03/2019   UTI due to Klebsiella species 03/03/2019   Plantar ulcer of left foot ([place]) 03/03/2019   Acute on chronic diastolic (congestive) heart failure ([place]) 02/25/2019   Acute respiratory failure with hypoxia ([place]) 02/25/2019   Acute renal failure superimposed on stage 4 chronic kidney disease ([place]) 02/24/2019   Congestive heart failure ([place]) 02/24/2019   Dyspnea 02/23/2019   Essential hypertension 02/23/2019   Diabetes mellitus ([place]) 02/23/2019   CKD (chronic kidney disease) 02/23/2019   Psoriasis 02/23/2019    Past  Surgical History:  Procedure Laterality Date   ANKLE SURGERY Right    CHOLECYSTECTOMY     FOOT SURGERY Right    TRANSURETHRAL RESECTION OF PROSTATE N/A 01/15/2020   Procedure: TRANSURETHRAL RESECTION OF THE PROSTATE (TURP)  with General anesthesia and spinal;  Surgeon: Cleon Gustin, MD;  Location: [facility];  Service: Urology;  Laterality: N/A;       Family History  Problem Relation Age of Onset   Diabetes Mother    Heart attack Mother    Heart attack Father    Diabetes Brother     Social History   Tobacco Use   Smoking status: Former   Smokeless tobacco: Never  Scientific laboratory technician Use: Never used  Substance Use Topics   Alcohol use: Not Currently   Drug use: Never    Home Medications Prior to Admission medications   Medication Sig Start Date End Date Taking? Authorizing Provider  acetaminophen (TYLENOL) 500 MG tablet Take 1,000 mg by mouth every 6 (six) hours as needed for moderate pain or headache.    [provider]  amLODipine (NORVASC) 10 MG tablet Take 10 mg by mouth daily.    [provider]  amoxicillin-clavulanate (AUGMENTIN) 875-125 MG tablet Take 1 tablet by mouth 2 (two) times daily. One po bid x 7 days 01/04/21   Daleen Bo, MD  Ascorbic Acid (VITAMIN C) 500 MG CHEW Chew 1,000 mg by mouth daily.    [provider]  aspirin 325 MG tablet Take 325 mg by mouth daily.    [provider]  cholecalciferol (VITAMIN D3) 25 MCG (1000 UNIT) tablet Take 1,000 Units by mouth daily.    [provider]  collagenase (SANTYL) ointment Apply to right lateral malleolus once daily; apply in a 1/8 inch layer and top with saline moistened gauze 2X2. 11/05/20   Barton Dubois, MD  ferrous sulfate 325 (65 FE) MG tablet Take 325 mg by mouth 2 (two) times daily with a meal. Patient not taking: No sig reported    [provider]  hydrALAZINE (APRESOLINE) 100 MG tablet Take 1 tablet (100 mg total) by mouth 3 (three) times  daily. Patient taking differently: Take 100 mg by mouth 2 (two) times daily. 11/05/20 11/05/21  Barton Dubois, MD  insulin NPH-regular Human (NOVOLIN 70/30) (70-30) 100 UNIT/ML injection Inject 25 Units into the skin 2 (two) times daily with a meal. Sliding scale Patient not taking: No sig reported 09/27/16   [provider]  Lactulose 20 GM/30ML SOLN Take 30 mLs (20 g total) by mouth daily. Patient not taking: Reported on 01/03/2021 11/11/20   [name], MD  metoprolol succinate (TOPROL-XL) 25 MG 24 hr tablet Take 1 tablet (25 mg total) by mouth every evening. 11/05/20   Barton Dubois, MD  Multiple Vitamin (MULTIVITAMIN WITH MINERALS) TABS tablet Take 1 tablet by mouth daily.    [provider]  NOVOLIN 70/30 RELION (70-30) 100 UNIT/ML injection Inject  30 Units into the skin in the morning and at bedtime. Patient taking differently: Inject 25 Units into the skin in the morning and at bedtime. Sliding scale 11/05/20   Barton Dubois, MD  Nutritional Supplements (FEEDING SUPPLEMENT, NEPRO CARB STEADY,) LIQD Take 237 mLs by mouth 2 (two) times daily between meals. 11/05/20   Barton Dubois, MD  Omega-3 Fatty Acids (FISH OIL PO) Take 2,400 mg by mouth 2 (two) times daily.    [provider]  pravastatin (PRAVACHOL) 80 MG tablet Take 1 tablet (80 mg total) by mouth every evening. 03/20/19   Gerlene Fee, NP  sevelamer carbonate (RENVELA) 800 MG tablet Take 1 tablet (800 mg total) by mouth 3 (three) times daily with meals. Patient taking differently: Take 800 mg by mouth 2 (two) times daily with a meal. 11/05/20   Barton Dubois, MD  torsemide (DEMADEX) 100 MG tablet Take 50 mg by mouth 2 (two) times daily. Patient not taking: No sig reported 11/19/20   [provider]  torsemide (DEMADEX) 20 MG tablet Take 2.5 tablets (50 mg total) by mouth 2 (two) times daily. Patient taking differently: Take 50 mg by mouth daily. 11/05/20   Barton Dubois, MD  vitamin B-12  (CYANOCOBALAMIN) 1000 MCG tablet Take 1,000 mcg by mouth daily.    [provider]  vitamin E 1000 UNIT capsule Take 1,000 Units by mouth daily.    [provider]  Zinc 30 MG CAPS Take 1 capsule by mouth daily.    [provider]    Allergies    Dust mite extract, Prednisone, and Rocephin [ceftriaxone]  Review of Systems   Review of Systems  Constitutional:  Negative for chills, fatigue and fever.  HENT:  Negative for trouble swallowing.   Eyes:  Negative for visual disturbance.  Respiratory:  Negative for shortness of breath and wheezing.   Cardiovascular:  Negative for chest pain and palpitations.  Gastrointestinal:  Negative for abdominal pain, blood in stool, nausea and vomiting.  Genitourinary:  Negative for dysuria, flank pain and hematuria.  Musculoskeletal:  Negative for arthralgias, back pain, myalgias, neck pain and neck stiffness.  Skin:  Negative for color change.  Neurological:  Positive for weakness. Negative for dizziness, facial asymmetry, numbness and headaches.  Hematological:  Does not bruise/bleed easily.  Psychiatric/Behavioral:  Negative for confusion.    Physical Exam Updated Vital Signs BP 127/81   Pulse 79   Temp 97.7 F (36.5 C)   Resp 16   Ht 6\' 1"  (1.854 m)   Wt 129.3 kg   SpO2 98%   BMI 37.60 kg/m   Physical Exam Vitals and nursing note reviewed.  Constitutional:      Appearance: He is obese. He is not toxic-appearing.  HENT:     Head: Atraumatic.     Mouth/Throat:     Mouth: Mucous membranes are moist.  Eyes:     Extraocular Movements: Extraocular movements intact.     Conjunctiva/sclera: Conjunctivae normal.     Pupils: Pupils are equal, round, and reactive to light.  Cardiovascular:     Rate and Rhythm: Normal rate and regular rhythm.     Pulses: Normal pulses.  Pulmonary:     Effort: Pulmonary effort is normal.     Breath sounds: Normal breath sounds.  Abdominal:     Palpations: Abdomen is soft.      Tenderness: There is no abdominal tenderness.  Genitourinary:    Rectum: Guaiac result negative.     Comments: [place] Shark  heme-negative stool on DRE.  No palpable rectal masses.  Significant stool burden noted. Musculoskeletal:        General: No tenderness.     Cervical back: No tenderness.  Skin:    Capillary Refill: Capillary refill takes less than 2 seconds.     Comments: Abrasion of left anterior knee with Tegaderm applied.  Patient also has [place] dressings to bilateral lower extremities.  Neurological:     General: No focal deficit present.     Mental Status: He is alert.     Sensory: No sensory deficit.     Motor: No weakness.    ED Results / Procedures / Treatments   Labs (all labs ordered are listed, but only abnormal results are displayed) Labs Reviewed  COMPREHENSIVE METABOLIC PANEL - Abnormal; Notable for the following components:      Result Value   Glucose, Bld 153 (*)    BUN 64 (*)    Creatinine, Ser 4.34 (*)    Calcium 8.4 (*)    Albumin 2.8 (*)    GFR, Estimated 14 (*)    All other components within normal limits  CBC WITH DIFFERENTIAL/PLATELET - Abnormal; Notable for the following components:   RBC 2.54 (*)    Hemoglobin 7.0 (*)    HCT 22.3 (*)    RDW 20.2 (*)    All other components within normal limits  BRAIN NATRIURETIC PEPTIDE - Abnormal; Notable for the following components:   B Natriuretic Peptide 329.0 (*)    All other components within normal limits  RESP PANEL BY RT-PCR (FLU A&B, COVID) ARPGX2  POC OCCULT BLOOD, ED  TYPE AND SCREEN  PREPARE RBC (CROSSMATCH)  TROPONIN I (HIGH SENSITIVITY)  TROPONIN I (HIGH SENSITIVITY)    EKG EKG Interpretation  Date/Time:  Wednesday January 12 2021 12:02:49 EDT Ventricular Rate:  105 PR Interval:  196 QRS Duration: 92 QT Interval:  357 QTC Calculation: 472 R Axis:   74 Text Interpretation: Sinus or ectopic atrial tachycardia Low voltage, precordial leads Nonspecific T abnormalities, lateral leads since last  tracing no significant change Confirmed by Daleen Bo [phone]) on 01/12/2021 3:23:42 PM  Radiology DG Chest Portable 1 View  Result Date: 01/12/2021 CLINICAL DATA:  Weakness. EXAM: PORTABLE CHEST 1 VIEW COMPARISON:  January 03, 2021. FINDINGS: Stable cardiomegaly. No pneumothorax or pleural effusion is noted. Stable bibasilar subsegmental atelectasis. Bony thorax is unremarkable. IMPRESSION: Stable bibasilar subsegmental atelectasis. Electronically Signed   By: Marijo Conception M.D.   On: 01/12/2021 12:11    Procedures Procedures   Medications Ordered in ED Medications  0.9 %  sodium chloride infusion (Manually program via Guardrails IV Fluids) ( Intravenous New Bag/Given 01/12/21 1615)    ED Course  I have reviewed the triage vital signs and the nursing notes.  Pertinent labs & imaging results that were available during my care of the patient were reviewed by me and considered in my medical decision making (see chart for details).    MDM Rules/Calculators/A&P                           Patient here for evaluation of generalized weakness and inability to care for himself.  Has history of anemia.  On review of medical records hemoglobin 8.12 days ago.  No history of known GI bleed or prior blood transfusions.  He is requesting assistance for nursing home placement as well.  We will obtain baseline labs and chest x-ray.  He  has history of chronic kidney disease and was scheduled for dialysis shunt placement today.  On exam, patient is morbidly obese.  Abdomen is soft and nontender.  Mucous membranes are moist.  Vital signs reassuring.  Labs interpreted by me, no leukocytosis.  Hemoglobin today 7.0 this is a significant drop from 2 days ago.  He has a negative guaiac test today.  COVID-negative, EKG without acute ischemic changes, chest x-ray without acute findings.  BNP 329, improved from 9 days ago.  Chemistries show chronic renal insufficiency, creatinine somewhat improved from baseline.   Patient's progressing anemia likely cause of his generalized weakness.  Patient agreeable to blood transfusion.  He will need hospital admission, he is agreeable to plan.   CRITICAL CARE Performed by: Talia Hoheisel Total critical care time: 40 minutes Critical care time was exclusive of separately billable procedures and treating other patients. Critical care was necessary to treat or prevent imminent or life-threatening deterioration. Critical care was time spent personally by me on the following activities: development of treatment plan with patient and/or surrogate as well as nursing, discussions with consultants, evaluation of patient's response to treatment, examination of patient, obtaining history from patient or surrogate, ordering and performing treatments and interventions, ordering and review of laboratory studies, ordering and review of radiographic studies, pulse oximetry and re-evaluation of patient's condition.   Consulted [facility] hospitalist, Dr. Marily Memos who agrees to admit.     Final Clinical Impression(s) / ED Diagnoses Final diagnoses:  Symptomatic anemia    Rx / DC Orders ED Discharge Orders     None        Kem Parkinson, PA-C 01/12/21 1739    Daleen Bo, MD 01/13/21 1556

## 2021-01-13 ENCOUNTER — Encounter (HOSPITAL_COMMUNITY): Payer: Self-pay | Admitting: Hematology

## 2021-01-13 ENCOUNTER — Other Ambulatory Visit: Payer: Self-pay

## 2021-01-13 DIAGNOSIS — E1121 Type 2 diabetes mellitus with diabetic nephropathy: Secondary | ICD-10-CM | POA: Diagnosis not present

## 2021-01-13 DIAGNOSIS — I5032 Chronic diastolic (congestive) heart failure: Secondary | ICD-10-CM | POA: Diagnosis not present

## 2021-01-13 DIAGNOSIS — N185 Chronic kidney disease, stage 5: Secondary | ICD-10-CM

## 2021-01-13 DIAGNOSIS — E1122 Type 2 diabetes mellitus with diabetic chronic kidney disease: Secondary | ICD-10-CM | POA: Diagnosis not present

## 2021-01-13 DIAGNOSIS — D649 Anemia, unspecified: Secondary | ICD-10-CM | POA: Diagnosis not present

## 2021-01-13 LAB — BASIC METABOLIC PANEL
Anion gap: 9 (ref 5–15)
BUN: 61 mg/dL — ABNORMAL HIGH (ref 8–23)
CO2: 23 mmol/L (ref 22–32)
Calcium: 8.3 mg/dL — ABNORMAL LOW (ref 8.9–10.3)
Chloride: 104 mmol/L (ref 98–111)
Creatinine, Ser: 4.38 mg/dL — ABNORMAL HIGH (ref 0.61–1.24)
GFR, Estimated: 14 mL/min — ABNORMAL LOW (ref >60)
Glucose, Bld: 147 mg/dL — ABNORMAL HIGH (ref 70–99)
Potassium: 4.1 mmol/L (ref 3.5–5.1)
Sodium: 136 mmol/L (ref 135–145)

## 2021-01-13 LAB — CBC
HCT: 27 % — ABNORMAL LOW (ref 39.0–52.0)
Hemoglobin: 8.7 g/dL — ABNORMAL LOW (ref 13.0–17.0)
MCH: 28.2 pg (ref 26.0–34.0)
MCHC: 32.2 g/dL (ref 30.0–36.0)
MCV: 87.4 fL (ref 80.0–100.0)
Platelets: 201 10*3/uL (ref 150–400)
RBC: 3.09 MIL/uL — ABNORMAL LOW (ref 4.22–5.81)
RDW: 19.6 % — ABNORMAL HIGH (ref 11.5–15.5)
WBC: 8.5 10*3/uL (ref 4.0–10.5)
nRBC: 0 % (ref 0.0–0.2)

## 2021-01-13 LAB — TYPE AND SCREEN
ABO/RH(D): O POS
Antibody Screen: NEGATIVE
Unit division: 0
Unit division: 0

## 2021-01-13 LAB — BPAM RBC
Blood Product Expiration Date: 202208212359
Blood Product Expiration Date: 202208222359
ISSUE DATE / TIME: 202207201612
ISSUE DATE / TIME: 202207201910
Unit Type and Rh: 5100
Unit Type and Rh: 5100

## 2021-01-13 LAB — PHOSPHORUS: Phosphorus: 5 mg/dL — ABNORMAL HIGH (ref 2.5–4.6)

## 2021-01-13 LAB — GLUCOSE, CAPILLARY
Glucose-Capillary: 130 mg/dL — ABNORMAL HIGH (ref 70–99)
Glucose-Capillary: 151 mg/dL — ABNORMAL HIGH (ref 70–99)
Glucose-Capillary: 161 mg/dL — ABNORMAL HIGH (ref 70–99)
Glucose-Capillary: 124 mg/dL — ABNORMAL HIGH (ref 70–99)

## 2021-01-13 LAB — MAGNESIUM: Magnesium: 2.7 mg/dL — ABNORMAL HIGH (ref 1.7–2.4)

## 2021-01-13 MED ORDER — NYSTATIN 100000 UNIT/GM EX POWD
Freq: Three times a day (TID) | CUTANEOUS | Status: DC
  Administered 2021-01-17: 1 via TOPICAL
  Filled 2021-01-13 (×2): qty 15
  Filled 2021-01-13: qty 30
  Filled 2021-01-13: qty 15

## 2021-01-13 MED ORDER — HYDROMORPHONE HCL 1 MG/ML IJ SOLN
0.5000 mg | Freq: Once | INTRAMUSCULAR | Status: DC
Start: 2021-01-13 — End: 2021-01-16

## 2021-01-13 MED ORDER — CHLORHEXIDINE GLUCONATE CLOTH 2 % EX PADS
6.0000 | MEDICATED_PAD | Freq: Once | CUTANEOUS | Status: AC
  Administered 2021-01-13: 6 via TOPICAL

## 2021-01-13 MED ORDER — CEFAZOLIN IN SODIUM CHLORIDE 3-0.9 GM/100ML-% IV SOLN
3.0000 g | INTRAVENOUS | Status: AC
  Administered 2021-01-14: 3 g via INTRAVENOUS

## 2021-01-13 MED ORDER — FLUTICASONE PROPIONATE 50 MCG/ACT NA SUSP
1.0000 | Freq: Every day | NASAL | Status: DC
  Administered 2021-01-13 – 2021-01-27 (×14): 1 via NASAL
  Filled 2021-01-13 (×4): qty 16

## 2021-01-13 MED ORDER — GUAIFENESIN-DM 100-10 MG/5ML PO SYRP: 5.0000 mL | ORAL_SOLUTION | ORAL | Status: DC | PRN

## 2021-01-13 MED ORDER — CHLORHEXIDINE GLUCONATE CLOTH 2 % EX PADS: 6.0000 | MEDICATED_PAD | Freq: Once | CUTANEOUS | Status: DC

## 2021-01-13 MED ORDER — IPRATROPIUM-ALBUTEROL 0.5-2.5 (3) MG/3ML IN SOLN
3.0000 mL | Freq: Four times a day (QID) | RESPIRATORY_TRACT | Status: DC
  Administered 2021-01-13 (×2): 3 mL via RESPIRATORY_TRACT
  Filled 2021-01-13 (×2): qty 3

## 2021-01-13 NOTE — [place] FL2 (Signed)
[place] MEDICAID FL2 LEVEL OF CARE SCREENING TOOL     IDENTIFICATION  Patient Name: Danny [name] Birthdate: August 10, 1955 Sex: male Admission Date (Current Location): 01/12/2021  [facility] and [place] Number:  [employer] and Address:  [facility] [address] [place]      Provider Number: [id number]  Attending Physician Name and Address:  Barton Dubois, MD  Relative Name and Phone Number:  Hulan Amato, sister [phone]    Current Level of Care: Hospital Recommended Level of Care: [place] Prior Approval Number:    Date Approved/Denied:   PASRR Number: [id number]  Discharge Plan: SNF    Current Diagnoses: Patient Active Problem List   Diagnosis Date Noted   Symptomatic anemia 01/12/2021   B12 deficiency 12/06/2020   Osteomyelitis of right ankle (HCC)    Cellulitis, leg 10/28/2020   CKD (chronic kidney disease) stage 4, GFR 15-29 ml/min ([place]) 10/28/2020   Moderate protein-calorie malnutrition ([place]) 10/26/2020   Medical non-compliance 10/26/2020   Chronic ulcer of right ankle (HCC)    Venous stasis    Cellulitis 10/25/2020   Normocytic anemia 09/13/2020   Class 2 obesity    Respiratory failure ([place]) 03/23/2020   UTI (urinary tract infection), bacterial 01/22/2020   Acute on chronic anemia 01/16/2020   Benign prostatic hyperplasia with urinary obstruction 01/15/2020   AKI (acute kidney injury) ([place]) 07/08/2019   Hypoxia 07/08/2019   Pressure injury of skin 07/08/2019   Edema of both lower extremities    Anasarca 03/23/2019   Bladder outlet obstruction 03/23/2019   Yeast infection of the skin 03/16/2019   Diabetic ulcer of left foot ([place]) 03/15/2019   Diabetic ulcer of ankle ([place]) 03/15/2019   Hypertension associated with stage 3 chronic kidney disease due to type 2 diabetes mellitus ([place]) 03/09/2019   Controlled type 2 diabetes mellitus with stage 3 chronic kidney disease, with long-term current use of  insulin ([place]) 03/09/2019   Type 2 diabetes with nephropathy ([place]) 03/09/2019   Chronic gout due to renal impairment without tophus 03/09/2019   Emphysematous cystitis 03/05/2019   Bilateral hydronephrosis    Bilateral cellulitis of lower leg 03/03/2019   Urinary retention 03/03/2019   Klebsiella Cystitis 03/03/2019   UTI due to Klebsiella species 03/03/2019   Plantar ulcer of left foot ([place]) 03/03/2019   Chronic diastolic CHF (congestive heart failure) ([place]) 02/25/2019   Acute respiratory failure with hypoxia ([place]) 02/25/2019   Acute renal failure superimposed on stage 4 chronic kidney disease ([place]) 02/24/2019   Congestive heart failure ([place]) 02/24/2019   Dyspnea 02/23/2019   Essential hypertension 02/23/2019   Diabetes mellitus ([place]) 02/23/2019   CKD (chronic kidney disease) 02/23/2019   Psoriasis 02/23/2019    Orientation RESPIRATION BLADDER Height & Weight     Self, Time, Situation, Place  Normal External catheter Weight: 129.3 kg Height:  6\' 1"  (185.4 cm)  BEHAVIORAL SYMPTOMS/MOOD NEUROLOGICAL BOWEL NUTRITION STATUS      Continent Diet (See DC summary)  AMBULATORY STATUS COMMUNICATION OF NEEDS Skin   Extensive Assist Verbally Normal                       Personal Care Assistance Level of Assistance  Bathing, Feeding, Dressing Bathing Assistance: Maximum assistance Feeding assistance: Limited assistance Dressing Assistance: Maximum assistance     Functional Limitations Info  Sight, Hearing, Speech Sight Info: Adequate Hearing Info: Adequate Speech Info: Adequate    SPECIAL CARE FACTORS FREQUENCY  PT (By  licensed PT)     PT Frequency: 5 times a week              Contractures Contractures Info: Not present    Additional Factors Info  Code Status, Allergies Code Status Info: Full Allergies Info: Dust Mite, prednisone,recephin           Current Medications (01/13/2021):  This is the current hospital active medication list Current  Facility-Administered Medications  Medication Dose Route Frequency Provider Last Rate Last Admin   acetaminophen (TYLENOL) tablet 650 mg  650 mg Oral Q6H PRN Waldemar Dickens, MD       Or   acetaminophen (TYLENOL) suppository 650 mg  650 mg Rectal Q6H PRN Waldemar Dickens, MD       amLODipine (NORVASC) tablet 10 mg  10 mg Oral Daily Waldemar Dickens, MD   10 mg at 01/13/21 0854   amoxicillin-clavulanate (AUGMENTIN) 500-125 MG per tablet 500 mg  500 mg Oral BID Waldemar Dickens, MD   500 mg at 01/13/21 1497   aspirin tablet 325 mg  325 mg Oral Daily Waldemar Dickens, MD   325 mg at 01/13/21 0263   cholecalciferol (VITAMIN D3) tablet 1,000 Units  1,000 Units Oral Daily Waldemar Dickens, MD   1,000 Units at 01/13/21 0853   feeding supplement (NEPRO CARB STEADY) liquid 237 mL  237 mL Oral BID BM Waldemar Dickens, MD   237 mL at 01/13/21 0857   fluticasone (FLONASE) 50 MCG/ACT nasal spray 1 spray  1 spray Each Nare Daily Barton Dubois, MD       guaiFENesin-dextromethorphan (ROBITUSSIN DM) 100-10 MG/5ML syrup 5 mL  5 mL Oral Q4H PRN Barton Dubois, MD       heparin injection 5,000 Units  5,000 Units Subcutaneous Q8H Waldemar Dickens, MD   5,000 Units at 01/12/21 2153   hydrALAZINE (APRESOLINE) injection 5 mg  5 mg Intravenous Q4H PRN Waldemar Dickens, MD       hydrALAZINE (APRESOLINE) tablet 100 mg  100 mg Oral BID Waldemar Dickens, MD   100 mg at 01/13/21 0854   insulin aspart (novoLOG) injection 0-9 Units  0-9 Units Subcutaneous TID WC Waldemar Dickens, MD   2 Units at 01/13/21 1211   insulin aspart protamine- aspart (NOVOLOG MIX 70/30) injection 13 Units  13 Units Subcutaneous BID WC Waldemar Dickens, MD       ipratropium-albuterol (DUONEB) 0.5-2.5 (3) MG/3ML nebulizer solution 3 mL  3 mL Nebulization QID Barton Dubois, MD       methocarbamol (ROBAXIN) 500 mg in dextrose 5 % 50 mL IVPB  500 mg Intravenous Q6H PRN Waldemar Dickens, MD       metoprolol succinate (TOPROL-XL) 24 hr tablet 25 mg  25 mg  Oral QPM Waldemar Dickens, MD   25 mg at 01/12/21 1926   nystatin (MYCOSTATIN/NYSTOP) topical powder   Topical TID Barton Dubois, MD       ondansetron [facility]) tablet 4 mg  4 mg Oral Q6H PRN Waldemar Dickens, MD       Or   ondansetron [facility]) injection 4 mg  4 mg Intravenous Q6H PRN Waldemar Dickens, MD       pravastatin (PRAVACHOL) tablet 80 mg  80 mg Oral QPM Waldemar Dickens, MD   80 mg at 01/12/21 1927   sevelamer carbonate (RENVELA) tablet 800 mg  800 mg Oral BID WC Waldemar Dickens, MD   800 mg at  01/13/21 0853   torsemide (DEMADEX) tablet 50 mg  50 mg Oral BID Waldemar Dickens, MD   50 mg at 01/13/21 3557   vitamin B-12 (CYANOCOBALAMIN) tablet 1,000 mcg  1,000 mcg Oral Daily Waldemar Dickens, MD   1,000 mcg at 01/13/21 3220   vitamin E capsule 800 Units  800 Units Oral Daily Waldemar Dickens, MD   800 Units at 01/13/21 2542   zinc sulfate capsule 220 mg  220 mg Oral Daily Waldemar Dickens, MD   220 mg at 01/13/21 7062     Discharge Medications: Please see discharge summary for a list of discharge medications.  Relevant Imaging Results:  Relevant Lab Results:   Additional Information SS# [id number]  Boneta Lucks, RN

## 2021-01-13 NOTE — Plan of Care (Signed)
  Problem: Acute Rehab OT Goals (only OT should resolve) Goal: Pt. Will Perform Grooming Flowsheets (Taken 01/13/2021 1134) Pt Will Perform Grooming:  sitting  with set-up  with adaptive equipment Goal: Pt. Will Perform Lower Body Dressing Flowsheets (Taken 01/13/2021 1134) Pt Will Perform Lower Body Dressing:  with min assist  with adaptive equipment  sitting/lateral leans  bed level Goal: Pt. Will Transfer To Toilet Flowsheets (Taken 01/13/2021 1134) Pt Will Transfer to Toilet:  with mod assist  stand pivot transfer  bedside commode Goal: Pt/Caregiver Will Perform Home Exercise Program Flowsheets (Taken 01/13/2021 1134) Pt/caregiver will Perform Home Exercise Program:  Increased strength  Both right and left upper extremity  Independently  Vershawn Westrup OT, MOT

## 2021-01-13 NOTE — Evaluation (Signed)
Occupational Therapy Evaluation Patient Details Name: Kamilo Och MRN: [id number] DOB: 30-Nov-1955 Today's Date: 01/13/2021    History of Present Illness [name] is a 65 y.o. male with medical history significant of Anemia of Chronic disease, CKD stage 5, DM, Chronic LE ulcers from venous stasis dermatitis and DM, HTN, Urinary retention. Pt states he has been an overall physical decline for several days w/o acute complaint. On day of admission pt unable to ambulate from bed and care for self. Profound generalized weakness. Denies CP, SOB, palpitations, ABD pain, dysuria, Frequency, rash, n/v, HA, fever, Cough, hematochezia, melena. Still on Augmentin for Bronchitis that has essentially resolved per pt. Started on 01/04/21. Leg wounds (chronic) were wrapped by home health aid on 01/10/21 per pt. No change in medication recenlyt and endorses compliance.   Clinical Impression   Pt agreeable to OT/PT co-evaluation but reporting significant fatigue. Pt often reporting "I don't have the energy." Despite this pt was able to complete bed mobility to mod A with HOB elevated for supine to sit. Sit to stand attempted but with little to no success likely due to pt level of fatigue. Pt demonstrates general UE weakness and poor endurance. Pt would benefit from continued OT in the hospital and recommended venue below to increase strength, balance, and endurance for safe ADL's.     Follow Up Recommendations  SNF    Equipment Recommendations  None recommended by OT           Precautions / Restrictions Precautions Precautions: Fall Restrictions Weight Bearing Restrictions: No      Mobility Bed Mobility Overal bed mobility: Needs Assistance Bed Mobility: Supine to Sit;Sit to Supine     Supine to sit: Mod assist;HOB elevated Sit to supine: Mod assist   General bed mobility comments: Slow labored movment; pt complaining of fatigue.    Transfers Overall transfer level: Needs assistance Equipment  used: Rolling walker (2 wheeled)             General transfer comment: Attempted sit to stand but pt reported he did not have the energy to do so today. Partial stand attempted with little to no elevation from bed with therapists assisting on either side with pt using RW.    Balance Overall balance assessment: Needs assistance Sitting-balance support: No upper extremity supported;Feet supported Sitting balance-Leahy Scale: Fair Sitting balance - Comments: sitting at EOB                                   ADL either performed or assessed with clinical judgement   ADL Overall ADL's : Needs assistance/impaired                     Lower Body Dressing: Total assistance;Bed level Lower Body Dressing Details (indicate cue type and reason): Donning socks at bed level.                     Vision Baseline Vision/History:  (Pt reports he needs to have his eyes checked.) Patient Visual Report: No change from baseline                  Pertinent Vitals/Pain Pain Assessment: Faces Faces Pain Scale: Hurts a little bit Pain Location: Constipation. Pain Descriptors / Indicators: Other (Comment) (Constipation) Pain Intervention(s): Limited activity within patient's tolerance;Monitored during session;Repositioned     Hand Dominance Right   Extremity/Trunk Assessment Upper Extremity Assessment  Upper Extremity Assessment: Generalized weakness   Lower Extremity Assessment Lower Extremity Assessment: Defer to PT evaluation       Communication Communication Communication: No difficulties   Cognition Arousal/Alertness: Awake/alert Behavior During Therapy: WFL for tasks assessed/performed Overall Cognitive Status: Within Functional Limits for tasks assessed                                                      Home Living Family/patient expects to be discharged to:: Skilled nursing facility Living Arrangements: Alone Available  Help at Discharge: Family;Available PRN/intermittently Type of Home: Apartment Home Access: Ramped entrance     Home Layout: One level     Bathroom Shower/Tub: Walk-in shower         Home Equipment: Environmental consultant - 4 wheels;Shower seat;Bedside commode;Walker - 2 wheels;Wheelchair - manual          Prior Functioning/Environment Level of Independence: Needs assistance  Gait / Transfers Assistance Needed: Pt uses RW for mobility ADL's / Homemaking Assistance Needed: pt reports independent with transfers, bathing, dressing. Receives assist for IADL's            OT Problem List: Decreased strength;Decreased activity tolerance;Impaired balance (sitting and/or standing)      OT Treatment/Interventions: Self-care/ADL training;Therapeutic exercise;Energy conservation;Therapeutic activities;Patient/family education;Balance training    OT Goals(Current goals can be found in the care plan section) Acute Rehab OT Goals Patient Stated Goal: return home OT Goal Formulation: With patient Time For Goal Achievement: 01/27/21 Potential to Achieve Goals: Fair  OT Frequency: Min 2X/week               Co-evaluation PT/OT/SLP Co-Evaluation/Treatment: Yes Reason for Co-Treatment: Complexity of the patient's impairments (multi-system involvement)   OT goals addressed during session: ADL's and self-care                       End of Session Equipment Utilized During Treatment: Rolling walker  Activity Tolerance: Patient limited by fatigue Patient left: in bed;with call bell/phone within reach  OT Visit Diagnosis: Unsteadiness on feet (R26.81);Muscle weakness (generalized) (M62.81)                Time: 1950-9326 OT Time Calculation (min): 21 min Charges:  OT General Charges $OT Visit: 1 Visit OT Evaluation $OT Eval Low Complexity: 1 Low  Kolbee Stallman OT, MOT  Larey Seat 01/13/2021, 11:31 AM

## 2021-01-13 NOTE — Plan of Care (Signed)
  Problem: Acute Rehab PT Goals(only PT should resolve) Goal: Pt Will Go Supine/Side To Sit Outcome: Progressing Flowsheets (Taken 01/13/2021 1210) Pt will go Supine/Side to Sit: with minimal assist Goal: Patient Will Transfer Sit To/From Stand Outcome: Progressing Flowsheets (Taken 01/13/2021 1210) Patient will transfer sit to/from stand: with moderate assist Goal: Pt Will Transfer Bed To Chair/Chair To Bed Outcome: Progressing Flowsheets (Taken 01/13/2021 1210) Pt will Transfer Bed to Chair/Chair to Bed: with mod assist Goal: Pt Will Ambulate Outcome: Progressing Flowsheets (Taken 01/13/2021 1210) Pt will Ambulate:  10 feet  with moderate assist  with rolling walker   12:11 PM, 01/13/21 Lonell Grandchild, MPT Physical Therapist with [facility] [phone] office [phone] mobile phone

## 2021-01-13 NOTE — Progress Notes (Signed)
Patient c/o of chest pain.  Provider was notified and a EKG was obtained.  See results of EKGs. Provider was notified via page of the results.  The first EKG said critical value Abnormal AV Block Type 2.  Several other EKGs were obtained but results were not reproduced.  After the EKGs patient stated that the pain was not as bad.

## 2021-01-13 NOTE — Evaluation (Signed)
Physical Therapy Evaluation Patient Details Name: Vernell Townley MRN: [id number] DOB: June 29, 1955 Today's Date: 01/13/2021   History of Present Illness  Aleksey Newbern is a 65 y.o. male with medical history significant of Anemia of Chronic disease, CKD stage 5, DM, Chronic LE ulcers from venous stasis dermatitis and DM, HTN, Urinary retention. Pt states he has been an overall physical decline for several days w/o acute complaint. On day of admission pt unable to ambulate from bed and care for self. Profound generalized weakness. Denies CP, SOB, palpitations, ABD pain, dysuria, Frequency, rash, n/v, HA, fever, Cough, hematochezia, melena. Still on Augmentin for Bronchitis that has essentially resolved per pt. Started on 01/04/21. Leg wounds (chronic) were wrapped by home health aid on 01/10/21 per pt. No change in medication recenlyt and endorses compliance.   Clinical Impression  Patient demonstrates slow labored movement for sitting up at bedside, fair/good sitting balance, unable to stand due to BLE weakness and poor return for attempting to scoot laterally at bedside due to c/o severe weakness.  Patient put back to bed with good return for using BUE/LE for repositioning self in bed using head board.  Patient will benefit from continued physical therapy in hospital and recommended venue below to increase strength, balance, endurance for safe ADLs and gait.      Follow Up Recommendations SNF    Equipment Recommendations       Recommendations for Other Services       Precautions / Restrictions Precautions Precautions: Fall Restrictions Weight Bearing Restrictions: No      Mobility  Bed Mobility Overal bed mobility: Needs Assistance Bed Mobility: Supine to Sit;Sit to Supine     Supine to sit: Mod assist;HOB elevated Sit to supine: Mod assist   General bed mobility comments: increase time, labored movement, required Mod assist to move legs during bed mobility    Transfers Overall  transfer level: Needs assistance Equipment used: Rolling walker (2 wheeled)             General transfer comment: Attempted sit to stand but pt reported he did not have the energy to do so today. Partial stand attempted with little to no elevation from bed with therapists assisting on either side with pt using RW.  Ambulation/Gait                Stairs            Wheelchair Mobility    Modified Rankin (Stroke Patients Only)       Balance Overall balance assessment: Needs assistance Sitting-balance support: Feet supported;No upper extremity supported Sitting balance-Leahy Scale: Fair Sitting balance - Comments: sitting at EOB                                     Pertinent Vitals/Pain Pain Assessment: Faces Faces Pain Scale: Hurts a little bit Pain Location: Constipation. Pain Descriptors / Indicators: Discomfort Pain Intervention(s): Limited activity within patient's tolerance;Monitored during session    Home Living Family/patient expects to be discharged to:: Private residence Living Arrangements: Alone Available Help at Discharge: Family;Available PRN/intermittently Type of Home: Apartment Home Access: Ramped entrance     Home Layout: One level Home Equipment: Walker - 4 wheels;Shower seat;Bedside commode;Walker - 2 wheels;Wheelchair - manual      Prior Function Level of Independence: Needs assistance   Gait / Transfers Assistance Needed: Pt uses RW for mobility, uses wheelchair mostly  ADL's / Homemaking  Assistance Needed: pt reports independent with transfers, bathing, dressing. Receives assist for IADL's        Hand Dominance   Dominant Hand: Right    Extremity/Trunk Assessment   Upper Extremity Assessment Upper Extremity Assessment: Defer to OT evaluation    Lower Extremity Assessment Lower Extremity Assessment: Generalized weakness    Cervical / Trunk Assessment Cervical / Trunk Assessment: Normal  Communication    Communication: No difficulties  Cognition Arousal/Alertness: Awake/alert Behavior During Therapy: WFL for tasks assessed/performed Overall Cognitive Status: Within Functional Limits for tasks assessed                                        General Comments      Exercises     Assessment/Plan    PT Assessment Patient needs continued PT services  PT Problem List Decreased strength;Decreased activity tolerance;Decreased balance;Decreased mobility       PT Treatment Interventions Gait training;DME instruction;Functional mobility training;Therapeutic activities;Therapeutic exercise;Balance training;Patient/family education    PT Goals (Current goals can be found in the Care Plan section)  Acute Rehab PT Goals Patient Stated Goal: return home PT Goal Formulation: With patient Time For Goal Achievement: 01/27/21 Potential to Achieve Goals: Good    Frequency Min 3X/week   Barriers to discharge        Co-evaluation   Reason for Co-Treatment: Complexity of the patient's impairments (multi-system involvement)   OT goals addressed during session: ADL's and self-care       AM-PAC PT "6 Clicks" Mobility  Outcome Measure Help needed turning from your back to your side while in a flat bed without using bedrails?: A Lot Help needed moving from lying on your back to sitting on the side of a flat bed without using bedrails?: A Lot Help needed moving to and from a bed to a chair (including a wheelchair)?: Total Help needed standing up from a chair using your arms (e.g., wheelchair or bedside chair)?: Total Help needed to walk in hospital room?: Total Help needed climbing 3-5 steps with a railing? : Total 6 Click Score: 8    End of Session   Activity Tolerance: Patient tolerated treatment well;Patient limited by fatigue Patient left: in bed;with call bell/phone within reach Nurse Communication: Mobility status PT Visit Diagnosis: Unsteadiness on feet  (R26.81);Other abnormalities of gait and mobility (R26.89);Muscle weakness (generalized) (M62.81)    Time: 0315-9458 PT Time Calculation (min) (ACUTE ONLY): 23 min   Charges:   PT Evaluation $PT Eval Moderate Complexity: 1 Mod PT Treatments $Therapeutic Activity: 23-37 mins        12:08 PM, 01/13/21 Lonell Grandchild, MPT Physical Therapist with [facility] [phone] office [phone] mobile phone

## 2021-01-13 NOTE — Progress Notes (Signed)
PROGRESS NOTE    Danny [name]  DXI:[id number] DOB: Oct 25, 1955 DOA: 01/12/2021 PCP: Celene Squibb, MD   Chief Complaint  Patient presents with   Weakness    Brief admission narrative:  As per H&P written by Dr. Marily Memos on 01/12/2021 Danny [name] is a 65 y.o. male with medical history significant of Anemia of Chronic disease, CKD stage 5, DM, Chronic LE ulcers from venous stasis dermatitis and DM, HTN, Urinary retention. Pt states he has been an overall physical decline for several days w/o acute complaint. On day of admission pt unable to ambulate from bed and care for self. Profound generalized weakness. Denies CP, SOB, palpitations, ABD pain, dysuria, Frequency, rash, n/v, HA, fever, Cough, hematochezia, melena. Still on Augmentin for Bronchitis that has essentially resolved per pt. Started on 01/04/21. Leg wounds (chronic) were wrapped by home health aid on 01/10/21 per pt. No change in medication recenlyt and endorses compliance.    ED Course: Objective findings below. Started on 2 units PRBC infusion   Assessment & Plan: 1-Symptomatic anemia -And with underlying history of a stage V renal failure and anemia of chronic kidney disease. -Hemoglobin down to 7.0 causing symptomatic anemia. -2 units PRBCs have been given -Hemoglobin up to 8.7 currently -Will continue to follow hemoglobin trend -IV iron and Epogen therapy as per renal function discretion. -No overt bleeding appreciated.  2-Diabetes mellitus ([place]) -will follow CBG's and adjust hypoglycemic regimen accordingly. -continue SSI  3-Chronic diastolic CHF (congestive heart failure) (HCC) -Continue daily weights and low-sodium diet -Patient in need of dialysis initiation -Volume will be managed with dialysis treatment.  4-stage V renal failure -Patient was due for temporary catheter placement and dialysis initiation as an outpatient. -Nephrology service has been consulted and will follow recommendation -General surgery planning  for dialysis catheter placement 01/14/2021 -Follow response and further recommendations.  5-secondary hyperparathyroidism -Continue the use of Renvela -Will follow nephrology further recommendations -Follow electrolytes stability.    6-history of bronchitis/community-acquired pneumonia -Continue as needed bronchodilators -Complete oral antibiotics that he was prior to admission receiving. -Patient is afebrile and not requiring oxygen supplementation. -Antitussive/mucolytic agents has been added.  7-essential hypertension -Continue current antihypertensive agents -Heart healthy diet has been encouraged.  8-hyperlipidemia -Continue statins.   DVT prophylaxis: (Lovenox/Heparin/SCD's/anticoagulated/None (if comfort care) Code Status: (Full/Partial - specify details) Family Communication: (Specify name, relationship & date discussed. NO "discussed with patient") Disposition:   Status is: Inpatient  Remains inpatient appropriate because:IV treatments appropriate due to intensity of illness or inability to take PO  Dispo: The patient is from: Home              Anticipated d/c is to: SNF              Patient currently is not medically stable to d/c.   Difficult to place patient Yes    Consultants:  Nephrology service General surgery  Procedures:  See below for x-ray reports  Antimicrobials:  Augmentin (anticipated last dose 01/13/2021).   Subjective: Complaining of generalized weakness and deconditioning; denies chest pain.  Positive orthopnea, ongoing dry cough and spells and not feeling well.  Objective: Vitals:   01/12/21 2222 01/13/21 0559 01/13/21 1329 01/13/21 1537  BP: 122/75 (!) 143/81 (!) 141/73   Pulse: 83 (!) 109 98   Resp: 20 19 18    Temp: 97.8 F (36.6 C) 98 F (36.7 C) 98.3 F (36.8 C)   TempSrc: Temporal Oral Oral   SpO2: 93% 95% 94% 94%  Weight:  Height:        Intake/Output Summary (Last 24 hours) at 01/13/2021 1728 Last data filed at  01/13/2021 1300 Gross per 24 hour  Intake 1281.25 ml  Output 1200 ml  Net 81.25 ml   Filed Weights   01/12/21 1033  Weight: 129.3 kg    Examination:  General exam: Appears uncomfortable; slightly short of breath when laying flat, demonstrating intermittent nonproductive coughing spells and expressing feeling weak, deconditioned and severely tired.  No chest pain, no nausea or vomiting. Respiratory system: Decreased air movement at bases bilaterally; no wheezing, positive scattered rhonchi. Cardiovascular system: S1 and S2, no rubs, no gallops, no JVD. Gastrointestinal system: Abdomen is soft, nontender, nondistended, positive bowel sounds. Central nervous system: Alert and oriented. No focal neurological deficits. Extremities: Cyanosis or clubbing; Unna boots in place.  1+ edema bilaterally appreciated. Skin: No petechiae. Psychiatry: Judgement and insight appear normal. Mood & affect appropriate.     Data Reviewed: I have personally reviewed following labs and imaging studies  CBC: Recent Labs  Lab 01/10/21 1017 01/12/21 1202 01/13/21 0140  WBC 6.6 7.4 8.5  NEUTROABS 5.2 5.7  --   HGB 8.1* 7.0* 8.7*  HCT 25.8* 22.3* 27.0*  MCV 86.9 87.8 87.4  PLT 219 178 657    Basic Metabolic Panel: Recent Labs  Lab 01/12/21 1202 01/13/21 0140  NA 135 136  K 4.1 4.1  CL 104 104  CO2 25 23  GLUCOSE 153* 147*  BUN 64* 61*  CREATININE 4.34* 4.38*  CALCIUM 8.4* 8.3*  MG  --  2.7*  PHOS  --  5.0*    GFR: Estimated Creatinine Clearance: 23.1 mL/min (A) (by C-G formula based on SCr of 4.38 mg/dL (H)).  Liver Function Tests: Recent Labs  Lab 01/12/21 1202  AST 18  ALT 16  ALKPHOS 120  BILITOT 1.0  PROT 7.2  ALBUMIN 2.8*    CBG: Recent Labs  Lab 01/12/21 2053 01/13/21 0704 01/13/21 1058 01/13/21 1609  GLUCAP 141* 130* 151* 161*     Recent Results (from the past 240 hour(s))  Resp Panel by RT-PCR (Flu A&B, Covid) Nasopharyngeal Swab     Status: None    Collection Time: 01/12/21 12:11 PM   Specimen: Nasopharyngeal Swab; Nasopharyngeal(NP) swabs in vial transport medium  Result Value Ref Range Status   SARS Coronavirus 2 by RT PCR NEGATIVE NEGATIVE Final    Comment: (NOTE) SARS-CoV-2 target nucleic acids are NOT DETECTED.  The SARS-CoV-2 RNA is generally detectable in upper respiratory specimens during the acute phase of infection. The lowest concentration of SARS-CoV-2 viral copies this assay can detect is 138 copies/mL. A negative result does not preclude SARS-Cov-2 infection and should not be used as the sole basis for treatment or other patient management decisions. A negative result may occur with  improper specimen collection/handling, submission of specimen other than nasopharyngeal swab, presence of viral mutation(s) within the areas targeted by this assay, and inadequate number of viral copies(<138 copies/mL). A negative result must be combined with clinical observations, patient history, and epidemiological information. The expected result is Negative.  Fact Sheet for Patients:  EntrepreneurPulse.com.au  Fact Sheet for Healthcare Providers:  IncredibleEmployment.be  This test is no t yet approved or cleared by the [place] FDA and  has been authorized for detection and/or diagnosis of SARS-CoV-2 by FDA under an Emergency Use Authorization (EUA). This EUA will remain  in effect (meaning this test can be used) for the duration of the COVID-19 declaration under Section  564(b)(1) of the Act, 21 U.S.C.section 360bbb-3(b)(1), unless the authorization is terminated  or revoked sooner.       Influenza A by PCR NEGATIVE NEGATIVE Final   Influenza B by PCR NEGATIVE NEGATIVE Final    Comment: (NOTE) The Xpert Xpress SARS-CoV-2/FLU/RSV plus assay is intended as an aid in the diagnosis of influenza from Nasopharyngeal swab specimens and should not be used as a sole basis for treatment.  Nasal washings and aspirates are unacceptable for Xpert Xpress SARS-CoV-2/FLU/RSV testing.  Fact Sheet for Patients: EntrepreneurPulse.com.au  Fact Sheet for Healthcare Providers: IncredibleEmployment.be  This test is not yet approved or cleared by the [place] FDA and has been authorized for detection and/or diagnosis of SARS-CoV-2 by FDA under an Emergency Use Authorization (EUA). This EUA will remain in effect (meaning this test can be used) for the duration of the COVID-19 declaration under Section 564(b)(1) of the Act, 21 U.S.C. section 360bbb-3(b)(1), unless the authorization is terminated or revoked.  Performed at [facility], [address], [place], [place] [address]      Radiology Studies: DG Chest Portable 1 View  Result Date: 01/12/2021 CLINICAL DATA:  Weakness. EXAM: PORTABLE CHEST 1 VIEW COMPARISON:  January 03, 2021. FINDINGS: Stable cardiomegaly. No pneumothorax or pleural effusion is noted. Stable bibasilar subsegmental atelectasis. Bony thorax is unremarkable. IMPRESSION: Stable bibasilar subsegmental atelectasis. Electronically Signed   By: Marijo Conception M.D.   On: 01/12/2021 12:11     Scheduled Meds:  amLODipine  10 mg Oral Daily   amoxicillin-clavulanate  500 mg Oral BID   aspirin  325 mg Oral Daily   Chlorhexidine Gluconate Cloth  6 each Topical Once   And   Chlorhexidine Gluconate Cloth  6 each Topical Once   cholecalciferol  1,000 Units Oral Daily   feeding supplement (NEPRO CARB STEADY)  237 mL Oral BID BM   fluticasone  1 spray Each Nare Daily   heparin  5,000 Units Subcutaneous Q8H   hydrALAZINE  100 mg Oral BID   insulin aspart  0-9 Units Subcutaneous TID WC   insulin aspart protamine- aspart  13 Units Subcutaneous BID WC   ipratropium-albuterol  3 mL Nebulization QID   metoprolol succinate  25 mg Oral QPM   nystatin   Topical TID   pravastatin  80 mg Oral QPM   sevelamer carbonate  800 mg Oral BID WC    torsemide  50 mg Oral BID   vitamin B-12  1,000 mcg Oral Daily   vitamin E  800 Units Oral Daily   zinc sulfate  220 mg Oral Daily   Continuous Infusions:  [START ON 01/14/2021]  ceFAZolin (ANCEF) IV     methocarbamol (ROBAXIN) IV       LOS: 1 day    Time spent: 35 minutes   Barton Dubois, MD [facility]   To contact the attending provider between 7A-7P or the covering provider during after hours 7P-7A, please log into the web site [email or web address] and access using universal [place] password for that web site. If you do not have the password, please call the hospital operator.  01/13/2021, 5:28 PM

## 2021-01-13 NOTE — TOC Initial Note (Addendum)
Transition of Care [facility]) - Initial/Assessment Note    Patient Details  Name: Danny [name] MRN: [id number] Date of Birth: Jul 17, 1955  Transition of [facility]) CM/SW Contact:    Boneta Lucks, RN Phone Number: 01/13/2021, 1:33 PM  Clinical Narrative:    Patient admitted with symptomatic anemia. Patient lives at home alone and not doing well on his own.  Staying in bed, not eating, came in wet and soiled. Family lives out of town. PT is recommending SNF. Patient is agreeable, only has a few days left. FL2 sent out. TOC contacted [facility] and [facility], Development worker, community to follow patient.            Addendum : Sarah with [facility] called, patient is active with them for  RN/PT/OT.  They have also ordered a power wheelchair. However it can not be delivered to SNF. TOC to follow for discharge plan.   Expected Discharge Plan: Skilled Nursing Facility Barriers to Discharge: Continued Medical Work up   Patient Goals and CMS Choice Patient states their goals for this hospitalization and ongoing recovery are:: to get better. CMS Medicare.gov Compare Post Acute Care list provided to:: Patient    Expected Discharge Plan and Services Expected Discharge Plan: [place]      Living arrangements for the past 2 months: Apartment                     Prior Living Arrangements/Services Living arrangements for the past 2 months: Apartment Lives with:: Self Patient language and need for interpreter reviewed:: Yes        Need for Family Participation in Patient Care: Yes (Comment) Care giver support system in place?: Yes (comment) Current home services: DME Criminal Activity/Legal Involvement Pertinent to Current Situation/Hospitalization: No - Comment as needed  Activities of Daily Living Home Assistive Devices/Equipment: Wheelchair, Grab bars around toilet, Raised toilet seat with rails, Dentures (specify type) ADL Screening (condition at time of admission) Patient's  cognitive ability adequate to safely complete daily activities?: No Is the patient deaf or have difficulty hearing?: No Does the patient have difficulty seeing, even when wearing glasses/contacts?: No Does the patient have difficulty concentrating, remembering, or making decisions?: No Patient able to express need for assistance with ADLs?: Yes Does the patient have difficulty dressing or bathing?: Yes Independently performs ADLs?: No Communication: Independent Dressing (OT): Needs assistance Is this a change from baseline?: Pre-admission baseline Grooming: Needs assistance Is this a change from baseline?: Pre-admission baseline Feeding: Needs assistance Is this a change from baseline?: Pre-admission baseline Bathing: Needs assistance Is this a change from baseline?: Pre-admission baseline Toileting: Needs assistance Is this a change from baseline?: Pre-admission baseline In/Out Bed: Needs assistance Is this a change from baseline?: Pre-admission baseline Walks in Home: Dependent Is this a change from baseline?: Pre-admission baseline Does the patient have difficulty walking or climbing stairs?: Yes Weakness of Legs: Both Weakness of Arms/Hands: Both  Permission Sought/Granted     Emotional Assessment     Affect (typically observed): Pleasant Orientation: : Oriented to Self, Oriented to Place, Oriented to  Time, Oriented to Situation Alcohol / Substance Use: Not Applicable Psych Involvement: No (comment)  Admission diagnosis:  Symptomatic anemia [D64.9] Patient Active Problem List   Diagnosis Date Noted   Symptomatic anemia 01/12/2021   B12 deficiency 12/06/2020   Osteomyelitis of right ankle (HCC)    Cellulitis, leg 10/28/2020   CKD (chronic kidney disease) stage 4, GFR 15-29 ml/min (HCC) 10/28/2020   Moderate protein-calorie malnutrition (  [place]) 10/26/2020   Medical non-compliance 10/26/2020   Chronic ulcer of right ankle ([place])    Venous stasis    Cellulitis  10/25/2020   Normocytic anemia 09/13/2020   Class 2 obesity    Respiratory failure ([place]) 03/23/2020   UTI (urinary tract infection), bacterial 01/22/2020   Acute on chronic anemia 01/16/2020   Benign prostatic hyperplasia with urinary obstruction 01/15/2020   AKI (acute kidney injury) ([place]) 07/08/2019   Hypoxia 07/08/2019   Pressure injury of skin 07/08/2019   Edema of both lower extremities    Anasarca 03/23/2019   Bladder outlet obstruction 03/23/2019   Yeast infection of the skin 03/16/2019   Diabetic ulcer of left foot ([place]) 03/15/2019   Diabetic ulcer of ankle ([place]) 03/15/2019   Hypertension associated with stage 3 chronic kidney disease due to type 2 diabetes mellitus ([place]) 03/09/2019   Controlled type 2 diabetes mellitus with stage 3 chronic kidney disease, with long-term current use of insulin ([place]) 03/09/2019   Type 2 diabetes with nephropathy ([place]) 03/09/2019   Chronic gout due to renal impairment without tophus 03/09/2019   Emphysematous cystitis 03/05/2019   Bilateral hydronephrosis    Bilateral cellulitis of lower leg 03/03/2019   Urinary retention 03/03/2019   Klebsiella Cystitis 03/03/2019   UTI due to Klebsiella species 03/03/2019   Plantar ulcer of left foot ([place]) 03/03/2019   Chronic diastolic CHF (congestive heart failure) ([place]) 02/25/2019   Acute respiratory failure with hypoxia ([place]) 02/25/2019   Acute renal failure superimposed on stage 4 chronic kidney disease ([place]) 02/24/2019   Congestive heart failure ([place]) 02/24/2019   Dyspnea 02/23/2019   Essential hypertension 02/23/2019   Diabetes mellitus ([place]) 02/23/2019   CKD (chronic kidney disease) 02/23/2019   Psoriasis 02/23/2019   PCP:  Celene Squibb, MD Pharmacy:   [facility] [address], [place] - [place] [address] [place] [address] Phone: [phone] Fax: [phone]  Readmission Risk Interventions Readmission Risk Prevention Plan 01/13/2021 11/05/2020 03/24/2020   Transportation Screening Complete - Complete  PCP or Specialist Appt within 3-5 Days - Complete Not Complete  Not Complete comments - - -  HRI or [place] - - Complete  Social Work Consult for [place] Planning/Counseling - - Complete  Palliative Care Screening - - Not Applicable  Medication Review Press photographer) Complete - Complete  PCP or Specialist appointment within 3-5 days of discharge - - -  [place] or Home Care Consult Complete - -  SW Recovery Care/Counseling Consult Complete - -  Palliative Care Screening Not Complete - -  Skilled Nursing Facility Complete - -  Some recent data might be hidden

## 2021-01-13 NOTE — Progress Notes (Signed)
[facility]  OR tomorrow for tunneled dialysis catheter. Will discuss tomorrow. NPO midnight.   Curlene Labrum, MD [facility] [address] [place], [place] [address] [phone] (office)

## 2021-01-13 NOTE — Care Management Important Message (Signed)
Important Message  Patient Details  Name: Danny [name] MRN: [id number] Date of Birth: 1956/02/08   Medicare Important Message Given:  Yes     [name] 01/13/2021, 4:24 PM

## 2021-01-13 NOTE — Consult Note (Signed)
[facility]  HISTORY AND PHYSICAL  Danny [name] is an 65 y.o. male.    Chief Complaint: symptomatic anemia  HPI: Pt is a 53M with a PMH sig for HTN, HLD, CKD IV/V, DM II, chronic venous stasis in unna boots, anemia, gout, and obesity who is now seen in consultation at the request of Dr. Dyann Kief for eval and recs re: management of CKD.    Pt was supposed to have his Brooks placed today as an outpatient to start dialysis with Dr Theador Hawthorne.  He has had several days of progressive weakness, lethargy, and no energy.  Hgb was 8.1 01/10/21 and by 01/12/21 was 7.0.  Got 2 u pRBCs with Hgb up to 8.7.  Despite this, symptoms still remain--> overall he says that he's bee dwindling the last several months.   PMH: Past Medical History:  Diagnosis Date   Anemia    Arthritis    CKD (chronic kidney disease), stage V ([place])    Diabetes mellitus without complication ([place])    Diastolic congestive heart failure ([place])    Foot ulcer ([place])    Gout    Hypertension    Urinary retention    PSH: Past Surgical History:  Procedure Laterality Date   ANKLE SURGERY Right    CHOLECYSTECTOMY     FOOT SURGERY Right    TRANSURETHRAL RESECTION OF PROSTATE N/A 01/15/2020   Procedure: TRANSURETHRAL RESECTION OF THE PROSTATE (TURP)  with General anesthesia and spinal;  Surgeon: Cleon Gustin, MD;  Location: [facility];  Service: Urology;  Laterality: N/A;     Past Medical History:  Diagnosis Date   Anemia    Arthritis    CKD (chronic kidney disease), stage V (HCC)    Diabetes mellitus without complication (HCC)    Diastolic congestive heart failure (HCC)    Foot ulcer (HCC)    Gout    Hypertension    Urinary retention     Medications:  Scheduled:  amLODipine  10 mg Oral Daily   amoxicillin-clavulanate  500 mg Oral BID   aspirin  325 mg Oral Daily   cholecalciferol  1,000 Units Oral Daily   feeding supplement (NEPRO CARB STEADY)  237 mL Oral BID BM   heparin  5,000 Units Subcutaneous Q8H    hydrALAZINE  100 mg Oral BID   insulin aspart  0-9 Units Subcutaneous TID WC   insulin aspart protamine- aspart  13 Units Subcutaneous BID WC   metoprolol succinate  25 mg Oral QPM   nystatin   Topical TID   pravastatin  80 mg Oral QPM   sevelamer carbonate  800 mg Oral BID WC   torsemide  50 mg Oral BID   vitamin B-12  1,000 mcg Oral Daily   vitamin E  800 Units Oral Daily   zinc sulfate  220 mg Oral Daily    Medications Prior to Admission  Medication Sig Dispense Refill   acetaminophen (TYLENOL) 500 MG tablet Take 1,000 mg by mouth every 6 (six) hours as needed for moderate pain or headache.     amLODipine (NORVASC) 10 MG tablet Take 10 mg by mouth daily.     amoxicillin-clavulanate (AUGMENTIN) 875-125 MG tablet Take 1 tablet by mouth 2 (two) times daily. One po bid x 7 days 14 tablet 0   Ascorbic Acid (VITAMIN C) 500 MG CHEW Chew 1,000 mg by mouth daily.     aspirin 325 MG tablet Take 325 mg by mouth daily.     cholecalciferol (  VITAMIN D3) 25 MCG (1000 UNIT) tablet Take 1,000 Units by mouth daily.     collagenase (SANTYL) ointment Apply to right lateral malleolus once daily; apply in a 1/8 inch layer and top with saline moistened gauze 2X2.  0   ferrous sulfate 325 (65 FE) MG tablet Take 325 mg by mouth 2 (two) times daily with a meal. (Patient not taking: No sig reported)     hydrALAZINE (APRESOLINE) 100 MG tablet Take 1 tablet (100 mg total) by mouth 3 (three) times daily. (Patient taking differently: Take 100 mg by mouth 2 (two) times daily.)     insulin NPH-regular Human (NOVOLIN 70/30) (70-30) 100 UNIT/ML injection Inject 25 Units into the skin 2 (two) times daily with a meal. Sliding scale (Patient not taking: No sig reported)     Lactulose 20 GM/30ML SOLN Take 30 mLs (20 g total) by mouth daily. (Patient not taking: Reported on 01/03/2021) 450 mL 1   metoprolol succinate (TOPROL-XL) 25 MG 24 hr tablet Take 1 tablet (25 mg total) by mouth every evening.     Multiple Vitamin  (MULTIVITAMIN WITH MINERALS) TABS tablet Take 1 tablet by mouth daily.     NOVOLIN 70/30 RELION (70-30) 100 UNIT/ML injection Inject 30 Units into the skin in the morning and at bedtime. (Patient taking differently: Inject 25 Units into the skin in the morning and at bedtime. Sliding scale)     Nutritional Supplements (FEEDING SUPPLEMENT, NEPRO CARB STEADY,) LIQD Take 237 mLs by mouth 2 (two) times daily between meals.  0   Omega-3 Fatty Acids (FISH OIL PO) Take 2,400 mg by mouth 2 (two) times daily.     pravastatin (PRAVACHOL) 80 MG tablet Take 1 tablet (80 mg total) by mouth every evening. 30 tablet 0   sevelamer carbonate (RENVELA) 800 MG tablet Take 1 tablet (800 mg total) by mouth 3 (three) times daily with meals. (Patient taking differently: Take 800 mg by mouth 2 (two) times daily with a meal.)     torsemide (DEMADEX) 100 MG tablet Take 50 mg by mouth 2 (two) times daily. (Patient not taking: No sig reported)     torsemide (DEMADEX) 20 MG tablet Take 2.5 tablets (50 mg total) by mouth 2 (two) times daily. (Patient taking differently: Take 50 mg by mouth daily.)     vitamin B-12 (CYANOCOBALAMIN) 1000 MCG tablet Take 1,000 mcg by mouth daily.     vitamin E 1000 UNIT capsule Take 1,000 Units by mouth daily.     Zinc 30 MG CAPS Take 1 capsule by mouth daily.      ALLERGIES:   Allergies  Allergen Reactions   Dust Mite Extract Itching and Other (See Comments)    Unknown reaction-potential shortness of breath   Prednisone Nausea And Vomiting   Rocephin [Ceftriaxone] Nausea And Vomiting    FAM HX: Family History  Problem Relation Age of Onset   Diabetes Mother    Heart attack Mother    Heart attack Father    Diabetes Brother     Social History:   reports that he has quit smoking. He has never used smokeless tobacco. He reports previous alcohol use. He reports that he does not use drugs.  ROS: ROS  Blood pressure (!) 143/81, pulse (!) 109, temperature 98 F (36.7 C), temperature  source Oral, resp. rate 19, height 6\' 1"  (1.854 m), weight 129.3 kg, SpO2 95 %. PHYSICAL EXAM: Physical Exam   Results for orders placed or performed during the hospital encounter  of 01/12/21 (from the past 48 hour(s))  Comprehensive metabolic panel     Status: Abnormal   Collection Time: 01/12/21 12:02 PM  Result Value Ref Range   Sodium 135 135 - 145 mmol/L   Potassium 4.1 3.5 - 5.1 mmol/L   Chloride 104 98 - 111 mmol/L   CO2 25 22 - 32 mmol/L   Glucose, Bld 153 (H) 70 - 99 mg/dL    Comment: Glucose reference range applies only to samples taken after fasting for at least 8 hours.   BUN 64 (H) 8 - 23 mg/dL   Creatinine, Ser 4.34 (H) 0.61 - 1.24 mg/dL   Calcium 8.4 (L) 8.9 - 10.3 mg/dL   Total Protein 7.2 6.5 - 8.1 g/dL   Albumin 2.8 (L) 3.5 - 5.0 g/dL   AST 18 15 - 41 U/L   ALT 16 0 - 44 U/L   Alkaline Phosphatase 120 38 - 126 U/L   Total Bilirubin 1.0 0.3 - 1.2 mg/dL   GFR, Estimated 14 (L) >60 mL/min    Comment: (NOTE) Calculated using the CKD-EPI Creatinine Equation (2021)    Anion gap 6 5 - 15    Comment: Performed at [facility], [address] [place], [place] [address]  CBC with Differential     Status: Abnormal   Collection Time: 01/12/21 12:02 PM  Result Value Ref Range   WBC 7.4 4.0 - 10.5 K/uL   RBC 2.54 (L) 4.22 - 5.81 MIL/uL   Hemoglobin 7.0 (L) 13.0 - 17.0 g/dL   HCT 22.3 (L) 39.0 - 52.0 %   MCV 87.8 80.0 - 100.0 fL   MCH 27.6 26.0 - 34.0 pg   MCHC 31.4 30.0 - 36.0 g/dL   RDW 20.2 (H) 11.5 - 15.5 %   Platelets 178 150 - 400 K/uL   nRBC 0.0 0.0 - 0.2 %   Neutrophils Relative % 77 %   Neutro Abs 5.7 1.7 - 7.7 K/uL   Lymphocytes Relative 9 %   Lymphs Abs 0.7 0.7 - 4.0 K/uL   Monocytes Relative 8 %   Monocytes Absolute 0.6 0.1 - 1.0 K/uL   Eosinophils Relative 5 %   Eosinophils Absolute 0.3 0.0 - 0.5 K/uL   Basophils Relative 1 %   Basophils Absolute 0.0 0.0 - 0.1 K/uL   Immature Granulocytes 0 %   Abs Immature Granulocytes 0.02 0.00 - 0.07 K/uL     Comment: Performed at [facility], [address], [place], [place] [address]  Troponin I (High Sensitivity)     Status: None   Collection Time: 01/12/21 12:02 PM  Result Value Ref Range   Troponin I (High Sensitivity) 13 <18 ng/L    Comment: (NOTE) Elevated high sensitivity troponin I (hsTnI) values and significant  changes across serial measurements may suggest ACS but many other  chronic and acute conditions are known to elevate hsTnI results.  Refer to the "Links" section for chest pain algorithms and additional  guidance. Performed at [facility], [address], [place], [place] [address]   Brain natriuretic peptide     Status: Abnormal   Collection Time: 01/12/21 12:02 PM  Result Value Ref Range   B Natriuretic Peptide 329.0 (H) 0.0 - 100.0 pg/mL    Comment: Performed at [facility], [address], [place], [place] [address]  Hemoglobin A1c     Status: Abnormal   Collection Time: 01/12/21 12:02 PM  Result Value Ref Range   Hgb A1c MFr Bld 6.8 (H) 4.8 -  5.6 %    Comment: (NOTE) Pre diabetes:          5.7%-6.4%  Diabetes:              >6.4%  Glycemic control for   <7.0% adults with diabetes    Mean Plasma Glucose 148.46 mg/dL    Comment: Performed at [facility], [place] [address] [place], [place] [address]  Prealbumin     Status: Abnormal   Collection Time: 01/12/21 12:02 PM  Result Value Ref Range   Prealbumin 14.1 (L) 18 - 38 mg/dL    Comment: Performed at [facility] [address] [place], [place] [address]  Resp Panel by RT-PCR (Flu A&B, Covid) Nasopharyngeal Swab     Status: None   Collection Time: 01/12/21 12:11 PM   Specimen: Nasopharyngeal Swab; Nasopharyngeal(NP) swabs in vial transport medium  Result Value Ref Range   SARS Coronavirus 2 by RT PCR NEGATIVE NEGATIVE    Comment: (NOTE) SARS-CoV-2 target nucleic acids are NOT DETECTED.  The SARS-CoV-2 RNA is generally detectable in upper respiratory specimens during the acute phase of  infection. The lowest concentration of SARS-CoV-2 viral copies this assay can detect is 138 copies/mL. A negative result does not preclude SARS-Cov-2 infection and should not be used as the sole basis for treatment or other patient management decisions. A negative result may occur with  improper specimen collection/handling, submission of specimen other than nasopharyngeal swab, presence of viral mutation(s) within the areas targeted by this assay, and inadequate number of viral copies(<138 copies/mL). A negative result must be combined with clinical observations, patient history, and epidemiological information. The expected result is Negative.  Fact Sheet for Patients:  EntrepreneurPulse.com.au  Fact Sheet for Healthcare Providers:  IncredibleEmployment.be  This test is no t yet approved or cleared by the [place] FDA and  has been authorized for detection and/or diagnosis of SARS-CoV-2 by FDA under an Emergency Use Authorization (EUA). This EUA will remain  in effect (meaning this test can be used) for the duration of the COVID-19 declaration under Section 564(b)(1) of the Act, 21 U.S.C.section 360bbb-3(b)(1), unless the authorization is terminated  or revoked sooner.       Influenza A by PCR NEGATIVE NEGATIVE   Influenza B by PCR NEGATIVE NEGATIVE    Comment: (NOTE) The Xpert Xpress SARS-CoV-2/FLU/RSV plus assay is intended as an aid in the diagnosis of influenza from Nasopharyngeal swab specimens and should not be used as a sole basis for treatment. Nasal washings and aspirates are unacceptable for Xpert Xpress SARS-CoV-2/FLU/RSV testing.  Fact Sheet for Patients: EntrepreneurPulse.com.au  Fact Sheet for Healthcare Providers: IncredibleEmployment.be  This test is not yet approved or cleared by the [place] FDA and has been authorized for detection and/or diagnosis of SARS-CoV-2 by FDA  under an Emergency Use Authorization (EUA). This EUA will remain in effect (meaning this test can be used) for the duration of the COVID-19 declaration under Section 564(b)(1) of the Act, 21 U.S.C. section 360bbb-3(b)(1), unless the authorization is terminated or revoked.  Performed at [facility], [address] [place], [place] [address]   Troponin I (High Sensitivity)     Status: None   Collection Time: 01/12/21  1:46 PM  Result Value Ref Range   Troponin I (High Sensitivity) 13 <18 ng/L    Comment: (NOTE) Elevated high sensitivity troponin I (hsTnI) values and significant  changes across serial measurements may suggest ACS but many other  chronic and acute conditions are  known to elevate hsTnI results.  Refer to the "Links" section for chest pain algorithms and additional  guidance. Performed at [facility], [address], [place], [place] [address]   POC occult blood, ED Provider will collect     Status: None   Collection Time: 01/12/21  2:12 PM  Result Value Ref Range   Fecal Occult Bld NEGATIVE NEGATIVE  Type and screen [facility]     Status: None (Preliminary result)   Collection Time: 01/12/21  2:31 PM  Result Value Ref Range   ABO/RH(D) O POS    Antibody Screen NEG    Sample Expiration 01/15/2021,2359    Unit Number [id number]    Blood Component Type RED CELLS,LR    Unit division 00    Status of Unit ISSUED    Transfusion Status OK TO TRANSFUSE    Crossmatch Result Compatible    Unit Number [id number]    Blood Component Type RED CELLS,LR    Unit division 00    Status of Unit ISSUED    Transfusion Status OK TO TRANSFUSE    Crossmatch Result      Compatible Performed at [facility], [address], [place], [place] [address]   Prepare RBC (crossmatch)     Status: None   Collection Time: 01/12/21  2:31 PM  Result Value Ref Range   Order Confirmation      ORDER PROCESSED BY BLOOD BANK Performed at [facility], [address], [place],  [place] [address]   Glucose, capillary     Status: Abnormal   Collection Time: 01/12/21  8:53 PM  Result Value Ref Range   Glucose-Capillary 141 (H) 70 - 99 mg/dL    Comment: Glucose reference range applies only to samples taken after fasting for at least 8 hours.  Magnesium     Status: Abnormal   Collection Time: 01/13/21  1:40 AM  Result Value Ref Range   Magnesium 2.7 (H) 1.7 - 2.4 mg/dL    Comment: Performed at [facility], [address], [place], [place] [address]  Phosphorus     Status: Abnormal   Collection Time: 01/13/21  1:40 AM  Result Value Ref Range   Phosphorus 5.0 (H) 2.5 - 4.6 mg/dL    Comment: Performed at [facility], [address] [place], [place] [address]  Basic metabolic panel     Status: Abnormal   Collection Time: 01/13/21  1:40 AM  Result Value Ref Range   Sodium 136 135 - 145 mmol/L   Potassium 4.1 3.5 - 5.1 mmol/L   Chloride 104 98 - 111 mmol/L   CO2 23 22 - 32 mmol/L   Glucose, Bld 147 (H) 70 - 99 mg/dL    Comment: Glucose reference range applies only to samples taken after fasting for at least 8 hours.   BUN 61 (H) 8 - 23 mg/dL   Creatinine, Ser 4.38 (H) 0.61 - 1.24 mg/dL   Calcium 8.3 (L) 8.9 - 10.3 mg/dL   GFR, Estimated 14 (L) >60 mL/min    Comment: (NOTE) Calculated using the CKD-EPI Creatinine Equation (2021)    Anion gap 9 5 - 15    Comment: Performed at [facility], [address] [place], [place] [address]  CBC     Status: Abnormal   Collection Time: 01/13/21  1:40 AM  Result Value Ref Range   WBC 8.5 4.0 - 10.5 K/uL   RBC 3.09 (L) 4.22 - 5.81 MIL/uL   Hemoglobin 8.7 (L) 13.0 -  17.0 g/dL   HCT 27.0 (L) 39.0 - 52.0 %   MCV 87.4 80.0 - 100.0 fL   MCH 28.2 26.0 - 34.0 pg   MCHC 32.2 30.0 - 36.0 g/dL   RDW 19.6 (H) 11.5 - 15.5 %   Platelets 201 150 - 400 K/uL   nRBC 0.0 0.0 - 0.2 %    Comment: Performed at [facility], [address] [place], [place] [address]  Glucose, capillary     Status: Abnormal   Collection Time: 01/13/21  7:04 AM   Result Value Ref Range   Glucose-Capillary 130 (H) 70 - 99 mg/dL    Comment: Glucose reference range applies only to samples taken after fasting for at least 8 hours.    DG Chest Portable 1 View  Result Date: 01/12/2021 CLINICAL DATA:  Weakness. EXAM: PORTABLE CHEST 1 VIEW COMPARISON:  January 03, 2021. FINDINGS: Stable cardiomegaly. No pneumothorax or pleural effusion is noted. Stable bibasilar subsegmental atelectasis. Bony thorax is unremarkable. IMPRESSION: Stable bibasilar subsegmental atelectasis. Electronically Signed   By: Marijo Conception M.D.   On: 01/12/2021 12:11    Assessment/Plan  CKD V--> ESRD: will need to start dialysis based on OP plan and uremic symptoms.  Have contacted gen surg via page for [facility].  Will start HD based on when [facility] can be placed.  Will also need OP HD arrangements. Symptomatic anemia: will need resumption of ESA- will do aranesp HTN: reasonable control DM II: per primary Bone/ mineral: Phos 5 Venous stasis ulcer: has unna boots Dispo: admitted  Spicer, Benjamine Mola 01/13/2021, 9:57 AM

## 2021-01-14 ENCOUNTER — Inpatient Hospital Stay (HOSPITAL_COMMUNITY): Payer: Medicare HMO

## 2021-01-14 ENCOUNTER — Inpatient Hospital Stay (HOSPITAL_COMMUNITY): Payer: Medicare HMO | Admitting: Anesthesiology

## 2021-01-14 ENCOUNTER — Encounter (HOSPITAL_COMMUNITY): Payer: Self-pay | Admitting: Family Medicine

## 2021-01-14 ENCOUNTER — Encounter (HOSPITAL_COMMUNITY)
Admission: EM | Disposition: A | Discharge status: Skilled Nursing Facility | Payer: Self-pay | Source: Home / Self Care | Attending: Internal Medicine

## 2021-01-14 DIAGNOSIS — I5032 Chronic diastolic (congestive) heart failure: Secondary | ICD-10-CM | POA: Diagnosis not present

## 2021-01-14 DIAGNOSIS — N186 End stage renal disease: Secondary | ICD-10-CM | POA: Diagnosis not present

## 2021-01-14 DIAGNOSIS — D649 Anemia, unspecified: Secondary | ICD-10-CM | POA: Diagnosis not present

## 2021-01-14 DIAGNOSIS — E1122 Type 2 diabetes mellitus with diabetic chronic kidney disease: Secondary | ICD-10-CM | POA: Diagnosis not present

## 2021-01-14 DIAGNOSIS — E1121 Type 2 diabetes mellitus with diabetic nephropathy: Secondary | ICD-10-CM | POA: Diagnosis not present

## 2021-01-14 DIAGNOSIS — Z992 Dependence on renal dialysis: Secondary | ICD-10-CM

## 2021-01-14 HISTORY — PX: INSERTION OF DIALYSIS CATHETER: SHX1324

## 2021-01-14 LAB — CBC
HCT: 27.4 % — ABNORMAL LOW (ref 39.0–52.0)
Hemoglobin: 8.7 g/dL — ABNORMAL LOW (ref 13.0–17.0)
MCH: 27.7 pg (ref 26.0–34.0)
MCHC: 31.8 g/dL (ref 30.0–36.0)
MCV: 87.3 fL (ref 80.0–100.0)
Platelets: 184 10*3/uL (ref 150–400)
RBC: 3.14 MIL/uL — ABNORMAL LOW (ref 4.22–5.81)
RDW: 19.7 % — ABNORMAL HIGH (ref 11.5–15.5)
WBC: 11.6 10*3/uL — ABNORMAL HIGH (ref 4.0–10.5)
nRBC: 0 % (ref 0.0–0.2)

## 2021-01-14 LAB — GLUCOSE, CAPILLARY
Glucose-Capillary: 100 mg/dL — ABNORMAL HIGH (ref 70–99)
Glucose-Capillary: 111 mg/dL — ABNORMAL HIGH (ref 70–99)
Glucose-Capillary: 93 mg/dL (ref 70–99)
Glucose-Capillary: 81 mg/dL (ref 70–99)
Glucose-Capillary: 106 mg/dL — ABNORMAL HIGH (ref 70–99)
Glucose-Capillary: 107 mg/dL — ABNORMAL HIGH (ref 70–99)

## 2021-01-14 LAB — RENAL FUNCTION PANEL
Albumin: 2.5 g/dL — ABNORMAL LOW (ref 3.5–5.0)
Anion gap: 6 (ref 5–15)
BUN: 62 mg/dL — ABNORMAL HIGH (ref 8–23)
CO2: 25 mmol/L (ref 22–32)
Calcium: 8 mg/dL — ABNORMAL LOW (ref 8.9–10.3)
Chloride: 103 mmol/L (ref 98–111)
Creatinine, Ser: 4.78 mg/dL — ABNORMAL HIGH (ref 0.61–1.24)
GFR, Estimated: 13 mL/min — ABNORMAL LOW (ref >60)
Glucose, Bld: 116 mg/dL — ABNORMAL HIGH (ref 70–99)
Phosphorus: 5 mg/dL — ABNORMAL HIGH (ref 2.5–4.6)
Potassium: 3.9 mmol/L (ref 3.5–5.1)
Sodium: 134 mmol/L — ABNORMAL LOW (ref 135–145)

## 2021-01-14 SURGERY — INSERTION OF DIALYSIS CATHETER
Anesthesia: Monitor Anesthesia Care | Laterality: Right

## 2021-01-14 MED ORDER — SODIUM CHLORIDE (PF) 0.9 % IJ SOLN
INTRAMUSCULAR | Status: DC | PRN
  Administered 2021-01-14: 500 mL

## 2021-01-14 MED ORDER — HEPARIN SODIUM (PORCINE) 1000 UNIT/ML IJ SOLN
INTRAMUSCULAR | Status: AC
  Filled 2021-01-14: qty 4

## 2021-01-14 MED ORDER — SODIUM CHLORIDE 0.9 % IV SOLN: 100.0000 mL | INTRAVENOUS | Status: DC | PRN

## 2021-01-14 MED ORDER — LIDOCAINE HCL (PF) 1 % IJ SOLN
INTRAMUSCULAR | Status: AC
  Filled 2021-01-14: qty 60

## 2021-01-14 MED ORDER — CHLORHEXIDINE GLUCONATE CLOTH 2 % EX PADS
6.0000 | MEDICATED_PAD | Freq: Every day | CUTANEOUS | Status: DC
  Administered 2021-01-15 – 2021-01-26 (×4): 6 via TOPICAL

## 2021-01-14 MED ORDER — HEPARIN SODIUM (PORCINE) 1000 UNIT/ML DIALYSIS
1000.0000 [IU] | INTRAMUSCULAR | Status: DC | PRN
  Filled 2021-01-14: qty 1

## 2021-01-14 MED ORDER — PROPOFOL 10 MG/ML IV BOLUS
INTRAVENOUS | Status: AC
  Filled 2021-01-14: qty 20

## 2021-01-14 MED ORDER — SODIUM CHLORIDE 0.9 % IV SOLN: INTRAVENOUS | Status: DC

## 2021-01-14 MED ORDER — LIDOCAINE HCL (PF) 1 % IJ SOLN
INTRAMUSCULAR | Status: DC | PRN
  Administered 2021-01-14: 15 mL
  Administered 2021-01-14: 2 mL

## 2021-01-14 MED ORDER — CHLORHEXIDINE GLUCONATE 4 % EX LIQD
60.0000 mL | Freq: Once | CUTANEOUS | Status: DC
Start: 2021-01-15 — End: 2021-01-14

## 2021-01-14 MED ORDER — LIDOCAINE-PRILOCAINE 2.5-2.5 % EX CREA
1.0000 "application " | TOPICAL_CREAM | CUTANEOUS | Status: DC | PRN
  Filled 2021-01-14: qty 5

## 2021-01-14 MED ORDER — MIDAZOLAM HCL 2 MG/2ML IJ SOLN
INTRAMUSCULAR | Status: AC
  Filled 2021-01-14: qty 2

## 2021-01-14 MED ORDER — HEPARIN SODIUM (PORCINE) 1000 UNIT/ML IJ SOLN
INTRAMUSCULAR | Status: DC | PRN
  Administered 2021-01-14: 4000 [IU]

## 2021-01-14 MED ORDER — MIDAZOLAM HCL 2 MG/2ML IJ SOLN
INTRAMUSCULAR | Status: DC | PRN
  Administered 2021-01-14: 1 mg via INTRAVENOUS

## 2021-01-14 MED ORDER — PENTAFLUOROPROP-TETRAFLUOROETH EX AERO
1.0000 "application " | INHALATION_SPRAY | CUTANEOUS | Status: DC | PRN
  Filled 2021-01-14: qty 30

## 2021-01-14 MED ORDER — FENTANYL CITRATE (PF) 100 MCG/2ML IJ SOLN: 25.0000 ug | INTRAMUSCULAR | Status: DC | PRN

## 2021-01-14 MED ORDER — IPRATROPIUM-ALBUTEROL 0.5-2.5 (3) MG/3ML IN SOLN
3.0000 mL | RESPIRATORY_TRACT | Status: DC | PRN
  Administered 2021-01-20: 3 mL via RESPIRATORY_TRACT
  Filled 2021-01-14: qty 3

## 2021-01-14 MED ORDER — PROPOFOL 500 MG/50ML IV EMUL
INTRAVENOUS | Status: DC | PRN
  Administered 2021-01-14: 10 ug/kg/min via INTRAVENOUS

## 2021-01-14 MED ORDER — CHLORHEXIDINE GLUCONATE 4 % EX LIQD: 60.0000 mL | Freq: Once | CUTANEOUS | Status: DC

## 2021-01-14 MED ORDER — LACTATED RINGERS IV SOLN: INTRAVENOUS | Status: DC

## 2021-01-14 MED ORDER — CHLORHEXIDINE GLUCONATE 0.12 % MT SOLN
15.0000 mL | Freq: Once | OROMUCOSAL | Status: AC
  Administered 2021-01-14: 15 mL via OROMUCOSAL

## 2021-01-14 MED ORDER — ORAL CARE MOUTH RINSE: 15.0000 mL | Freq: Once | OROMUCOSAL | Status: AC

## 2021-01-14 MED ORDER — LIDOCAINE HCL (PF) 1 % IJ SOLN: 5.0000 mL | INTRAMUSCULAR | Status: DC | PRN

## 2021-01-14 MED ORDER — OXYCODONE HCL 5 MG PO TABS
5.0000 mg | ORAL_TABLET | ORAL | Status: DC | PRN
  Administered 2021-01-14 – 2021-01-16 (×3): 5 mg via ORAL
  Filled 2021-01-14 (×3): qty 1

## 2021-01-14 MED ORDER — ALTEPLASE 2 MG IJ SOLR
2.0000 mg | Freq: Once | INTRAMUSCULAR | Status: DC | PRN
  Filled 2021-01-14: qty 2

## 2021-01-14 SURGICAL SUPPLY — 39 items
APPLICATOR CHLORAPREP 10.5 ORG (MISCELLANEOUS) ×2 IMPLANT
BAG DECANTER FOR FLEXI CONT (MISCELLANEOUS) ×2 IMPLANT
BIOPATCH RED 1 DISK 7.0 (GAUZE/BANDAGES/DRESSINGS) ×2 IMPLANT
CATH PALINDROME-P 23CM W/VT (CATHETERS) ×2 IMPLANT
COVER LIGHT HANDLE STERIS (MISCELLANEOUS) ×4 IMPLANT
COVER PROBE U/S 5X48 (MISCELLANEOUS) ×2 IMPLANT
DECANTER SPIKE VIAL GLASS SM (MISCELLANEOUS) ×4 IMPLANT
DERMABOND ADVANCED (GAUZE/BANDAGES/DRESSINGS) ×1
DERMABOND ADVANCED .7 DNX12 (GAUZE/BANDAGES/DRESSINGS) ×1 IMPLANT
DRAPE C-ARM FOLDED MOBILE STRL (DRAPES) ×2 IMPLANT
DRAPE CHEST BREAST 15X10 FENES (DRAPES) ×2 IMPLANT
DRSG SORBAVIEW 3.5X5-5/16 MED (GAUZE/BANDAGES/DRESSINGS) ×2 IMPLANT
ELECT REM PT RETURN 9FT ADLT (ELECTROSURGICAL) ×2
ELECTRODE REM PT RTRN 9FT ADLT (ELECTROSURGICAL) ×1 IMPLANT
GAUZE 4X4 16PLY ~~LOC~~+RFID DBL (SPONGE) ×4 IMPLANT
GEL ULTRASOUND 20GR AQUASONIC (MISCELLANEOUS) ×2 IMPLANT
GLOVE SURG ENC MOIS LTX SZ6.5 (GLOVE) ×2 IMPLANT
GLOVE SURG UNDER POLY LF SZ6.5 (GLOVE) ×2 IMPLANT
GLOVE SURG UNDER POLY LF SZ7 (GLOVE) ×4 IMPLANT
GOWN STRL REUS W/TWL LRG LVL3 (GOWN DISPOSABLE) ×4 IMPLANT
HOVERMATT SINGLE USE (MISCELLANEOUS) ×2 IMPLANT
IV NS 500ML (IV SOLUTION) ×1
IV NS 500ML BAXH (IV SOLUTION) ×1 IMPLANT
KIT BLADEGUARD II DBL (SET/KITS/TRAYS/PACK) ×2 IMPLANT
KIT TURNOVER KIT A (KITS) ×2 IMPLANT
MARKER SKIN DUAL TIP RULER LAB (MISCELLANEOUS) ×2 IMPLANT
NEEDLE HYPO 18GX1.5 BLUNT FILL (NEEDLE) ×2 IMPLANT
NEEDLE HYPO 25X1 1.5 SAFETY (NEEDLE) ×2 IMPLANT
PACK BASIC III (CUSTOM PROCEDURE TRAY) ×1
PACK SRG BSC III STRL LF ECLPS (CUSTOM PROCEDURE TRAY) ×1 IMPLANT
PAD ARMBOARD 7.5X6 YLW CONV (MISCELLANEOUS) ×2 IMPLANT
PENCIL SMOKE EVACUATOR COATED (MISCELLANEOUS) ×2 IMPLANT
SET BASIN LINEN APH (SET/KITS/TRAYS/PACK) ×2 IMPLANT
SUT MNCRL AB 4-0 PS2 18 (SUTURE) ×2 IMPLANT
SUT SILK 2 0 FSL 18 (SUTURE) ×2 IMPLANT
SUT VIC AB 3-0 SH 27 (SUTURE) ×1
SUT VIC AB 3-0 SH 27X BRD (SUTURE) ×1 IMPLANT
SYR 10ML LL (SYRINGE) ×4 IMPLANT
SYR CONTROL 10ML LL (SYRINGE) ×2 IMPLANT

## 2021-01-14 NOTE — Progress Notes (Signed)
Operative sites addressed with floor nurse. Sandbag removed. No new increase in drng. Pt awake, alert & oriented

## 2021-01-14 NOTE — TOC Progression Note (Addendum)
Transition of Care [facility]) - Progression Note    Patient Details  Name: Danny [name] MRN: [id number] Date of Birth: 07-06-1955  Transition of Care [facility]) CM/SW Contact  Danny Lucks, RN Phone Number: 01/14/2021, 11:14 AM  Clinical Narrative:   New admission checklist faxed to [facility] in [place]. Danny [name] HD RN will draw labs. Patient is will be encourage  to sit in a chair for dialysis and have MD assess for permanent assess. TOC to follow.   Addendum :  Danny [name] called to confirm a chair time for MWF 10:45 - 12:15   Ref# [id number]. Call Danny [name] with any questions [phone]  EXT [phone].  Expected Discharge Plan: Skilled Nursing Facility Barriers to Discharge: Continued Medical Work up  Expected Discharge Plan and Services Expected Discharge Plan: [place]      Living arrangements for the past 2 months: Apartment                     Readmission Risk Interventions Readmission Risk Prevention Plan 01/13/2021 11/05/2020 03/24/2020  Transportation Screening Complete - Complete  PCP or Specialist Appt within 3-5 Days - Complete Not Complete  Not Complete comments - - -  [place] or [place] - - Complete  Social Work Consult for [name] Planning/Counseling - - Complete  Palliative Care Screening - - Not Applicable  Medication Review Press photographer) Complete - Complete  PCP or Specialist appointment within 3-5 days of discharge - - -  [place] or Home Care Consult Complete - -  SW Recovery Care/Counseling Consult Complete - -  Palliative Care Screening Not Complete - -  Skilled Nursing Facility Complete - -  Some recent data might be hidden

## 2021-01-14 NOTE — Progress Notes (Signed)
[facility]  CXR without catheter in good position and no PTX. If bleeds from exit site, 10 minutes of continuous pressure on the tunneled part of catheter, hard firm. It will stop with appropriate pressure.  Curlene Labrum, MD [facility] [address] [place], [place] [address] [phone] (office)

## 2021-01-14 NOTE — Anesthesia Postprocedure Evaluation (Signed)
Anesthesia Post Note  Patient: Tresean Mattix  Procedure(s) Performed: INSERTION OF TUNNELED DIALYSIS CATHETER RIGHT INTERNAL JUGULAR (Right)  Patient location during evaluation: PACU Anesthesia Type: MAC Level of consciousness: awake and alert and oriented Pain management: pain level controlled Vital Signs Assessment: post-procedure vital signs reviewed and stable Respiratory status: spontaneous breathing and respiratory function stable Cardiovascular status: blood pressure returned to baseline and stable Postop Assessment: no apparent nausea or vomiting Anesthetic complications: no   No notable events documented.   Last Vitals:  Vitals:   01/14/21 1445 01/14/21 1500  BP: 126/78 124/76  Pulse:  97  Resp: 20 17  Temp:    SpO2: 95% 96%    Last Pain:  Vitals:   01/14/21 1500  TempSrc:   PainSc: 0-No pain                 Kilynn Fitzsimmons C Jatavia Keltner

## 2021-01-14 NOTE — Consult Note (Signed)
[place]  Reason for Consult: End stage renal disease  Referring Physician: Dr. Hollie Salk, MD   Chief Complaint   Weakness     HPI: Danny [name] is a 65 y.o. male with worsening CKD who needs dialysis to be started. He also has a history of HTN, HLD, DM, chronic heel ulcers, and anemia. He has been in the hospital on and off in the recent months.  He was going to get an outpatient tunneled catheter but had worsening weakness and lethargy and did not make it to that appointment.  He has received 2u PRBC while in the hospital for Hgb 7.   Past Medical History:  Diagnosis Date   Anemia    Arthritis    CKD (chronic kidney disease), stage V ([place])    Diabetes mellitus without complication ([place])    Diastolic congestive heart failure (HCC)    Foot ulcer ([place])    Gout    Hypertension    Urinary retention     Past Surgical History:  Procedure Laterality Date   ANKLE SURGERY Right    CHOLECYSTECTOMY     FOOT SURGERY Right    TRANSURETHRAL RESECTION OF PROSTATE N/A 01/15/2020   Procedure: TRANSURETHRAL RESECTION OF THE PROSTATE (TURP)  with General anesthesia and spinal;  Surgeon: Cleon Gustin, MD;  Location: [facility];  Service: Urology;  Laterality: N/A;    Family History  Problem Relation Age of Onset   Diabetes Mother    Heart attack Mother    Heart attack Father    Diabetes Brother     Social History   Tobacco Use   Smoking status: Former   Smokeless tobacco: Never  Scientific laboratory technician Use: Never used  Substance Use Topics   Alcohol use: Not Currently   Drug use: Never    Medications: I have reviewed the patient's current medications. Prior to Admission:  Medications Prior to Admission  Medication Sig Dispense Refill Last Dose   acetaminophen (TYLENOL) 500 MG tablet Take 1,000 mg by mouth every 6 (six) hours as needed for moderate pain or headache.   unknown at unknown   amLODipine (NORVASC) 10 MG tablet Take 10 mg by mouth daily.    unknown   amoxicillin-clavulanate (AUGMENTIN) 875-125 MG tablet Take 1 tablet by mouth 2 (two) times daily. One po bid x 7 days (Patient taking differently: Take 1 tablet by mouth 2 (two) times daily. 7 day course prescribed on 01/04/2021) 14 tablet 0 unknown   Ascorbic Acid (VITAMIN C) 500 MG CHEW Chew 1,000 mg by mouth daily.      aspirin 325 MG tablet Take 325 mg by mouth daily.   unknown at unknown   cholecalciferol (VITAMIN D3) 25 MCG (1000 UNIT) tablet Take 1,000 Units by mouth daily.      collagenase (SANTYL) ointment Apply to right lateral malleolus once daily; apply in a 1/8 inch layer and top with saline moistened gauze 2X2.  0    hydrALAZINE (APRESOLINE) 100 MG tablet Take 1 tablet (100 mg total) by mouth 3 (three) times daily. (Patient taking differently: Take 100 mg by mouth 2 (two) times daily.)   unknown   metoprolol succinate (TOPROL-XL) 25 MG 24 hr tablet Take 1 tablet (25 mg total) by mouth every evening.   unknown   Multiple Vitamin (MULTIVITAMIN WITH MINERALS) TABS tablet Take 1 tablet by mouth daily.      NOVOLIN 70/30 RELION (70-30) 100 UNIT/ML injection Inject 30 Units into the skin  in the morning and at bedtime. (Patient taking differently: Inject 25 Units into the skin in the morning and at bedtime. Sliding scale)   unknown   Omega-3 Fatty Acids (FISH OIL PO) Take 2,400 mg by mouth 2 (two) times daily.      pravastatin (PRAVACHOL) 80 MG tablet Take 1 tablet (80 mg total) by mouth every evening. 30 tablet 0 unknown   sevelamer carbonate (RENVELA) 800 MG tablet Take 1 tablet (800 mg total) by mouth 3 (three) times daily with meals. (Patient taking differently: Take 800 mg by mouth 2 (two) times daily with a meal.)   unknown   torsemide (DEMADEX) 100 MG tablet Take 50 mg by mouth 2 (two) times daily.   unknown   vitamin B-12 (CYANOCOBALAMIN) 1000 MCG tablet Take 1,000 mcg by mouth daily.      vitamin E 1000 UNIT capsule Take 1,000 Units by mouth daily.      Zinc 30 MG CAPS Take 1  capsule by mouth daily.      ferrous sulfate 325 (65 FE) MG tablet Take 325 mg by mouth 2 (two) times daily with a meal.      Lactulose 20 GM/30ML SOLN Take 30 mLs (20 g total) by mouth daily. 450 mL 1    Nutritional Supplements (FEEDING SUPPLEMENT, NEPRO CARB STEADY,) LIQD Take 237 mLs by mouth 2 (two) times daily between meals.  0    Scheduled:  amLODipine  10 mg Oral Daily   aspirin  325 mg Oral Daily   chlorhexidine  60 mL Topical Once   And   [START ON 01/15/2021] chlorhexidine  60 mL Topical Once   Chlorhexidine Gluconate Cloth  6 each Topical Once   cholecalciferol  1,000 Units Oral Daily   feeding supplement (NEPRO CARB STEADY)  237 mL Oral BID BM   fluticasone  1 spray Each Nare Daily   heparin  5,000 Units Subcutaneous Q8H   hydrALAZINE  100 mg Oral BID    HYDROmorphone (DILAUDID) injection  0.5 mg Intravenous Once   insulin aspart  0-9 Units Subcutaneous TID WC   insulin aspart protamine- aspart  13 Units Subcutaneous BID WC   metoprolol succinate  25 mg Oral QPM   nystatin   Topical TID   pravastatin  80 mg Oral QPM   sevelamer carbonate  800 mg Oral BID WC   torsemide  50 mg Oral BID   vitamin B-12  1,000 mcg Oral Daily   vitamin E  800 Units Oral Daily   zinc sulfate  220 mg Oral Daily   Continuous:  sodium chloride 50 mL/hr at 01/14/21 1305    ceFAZolin (ANCEF) IV     lactated ringers     methocarbamol (ROBAXIN) IV     [id number] **OR** acetaminophen, guaiFENesin-dextromethorphan, hydrALAZINE, ipratropium-albuterol, methocarbamol (ROBAXIN) IV, ondansetron **OR** ondansetron (ZOFRAN) IV  Allergies  Allergen Reactions   Dust Mite Extract Itching and Other (See Comments)    Unknown reaction-potential shortness of breath   Prednisone Nausea And Vomiting   Rocephin [Ceftriaxone] Nausea And Vomiting     ROS:  A comprehensive review of systems was negative except for: Constitutional: positive for fatigue and malaise Genitourinary: positive for CKD  worsening Musculoskeletal: positive for chronic wounds, venous stasis   Blood pressure 117/76, pulse (!) 110, temperature 98.3 F (36.8 C), temperature source Oral, resp. rate 16, height 6\' 1"  (1.854 m), weight 129.3 kg, SpO2 98 %. Physical Exam Vitals reviewed.  Constitutional:      Appearance:  He is obese.  HENT:     Head: Normocephalic.     Nose: Nose normal.  Eyes:     Pupils: Pupils are equal, round, and reactive to light.  Cardiovascular:     Rate and Rhythm: Regular rhythm.  Pulmonary:     Effort: Pulmonary effort is normal.  Abdominal:     General: There is no distension.     Palpations: Abdomen is soft.     Tenderness: There is no abdominal tenderness.  Musculoskeletal:     Cervical back: Normal range of motion.  Skin:    General: Skin is warm.  Neurological:     General: No focal deficit present.     Mental Status: He is alert and oriented to person, place, and time.  Psychiatric:        Mood and Affect: Mood normal.        Behavior: Behavior normal.        Thought Content: Thought content normal.        Judgment: Judgment normal.    Results: Results for orders placed or performed during the hospital encounter of 01/12/21 (from the past 48 hour(s))  Troponin I (High Sensitivity)     Status: None   Collection Time: 01/12/21  1:46 PM  Result Value Ref Range   Troponin I (High Sensitivity) 13 <18 ng/L    Comment: (NOTE) Elevated high sensitivity troponin I (hsTnI) values and significant  changes across serial measurements may suggest ACS but many other  chronic and acute conditions are known to elevate hsTnI results.  Refer to the "Links" section for chest pain algorithms and additional  guidance. Performed at [facility], [address] [place], [place] [address]   POC occult blood, ED Provider will collect     Status: None   Collection Time: 01/12/21  2:12 PM  Result Value Ref Range   Fecal Occult Bld NEGATIVE NEGATIVE  Type and screen [facility]     Status: None   Collection Time: 01/12/21  2:31 PM  Result Value Ref Range   ABO/RH(D) O POS    Antibody Screen NEG    Sample Expiration 01/15/2021,2359    Unit Number [id number]    Blood Component Type RED CELLS,LR    Unit division 00    Status of Unit ISSUED,FINAL    Transfusion Status OK TO TRANSFUSE    Crossmatch Result      Compatible Performed at [facility], [address] [place], [place] [address]    Unit Number [id number]    Blood Component Type RED CELLS,LR    Unit division 00    Status of Unit ISSUED,FINAL    Transfusion Status OK TO TRANSFUSE    Crossmatch Result Compatible   Prepare RBC (crossmatch)     Status: None   Collection Time: 01/12/21  2:31 PM  Result Value Ref Range   Order Confirmation      ORDER PROCESSED BY BLOOD BANK Performed at [facility], [address] [place], [place] [address]   Glucose, capillary     Status: Abnormal   Collection Time: 01/12/21  8:53 PM  Result Value Ref Range   Glucose-Capillary 141 (H) 70 - 99 mg/dL    Comment: Glucose reference range applies only to samples taken after fasting for at least 8 hours.  Magnesium     Status: Abnormal   Collection Time: 01/13/21  1:40 AM  Result Value Ref Range   Magnesium 2.7 (H) 1.7 - 2.4 mg/dL  Comment: Performed at [facility], [address], [place], [place] [address]  Phosphorus     Status: Abnormal   Collection Time: 01/13/21  1:40 AM  Result Value Ref Range   Phosphorus 5.0 (H) 2.5 - 4.6 mg/dL    Comment: Performed at [facility], [address] [place], [place] [address]  Basic metabolic panel     Status: Abnormal   Collection Time: 01/13/21  1:40 AM  Result Value Ref Range   Sodium 136 135 - 145 mmol/L   Potassium 4.1 3.5 - 5.1 mmol/L   Chloride 104 98 - 111 mmol/L   CO2 23 22 - 32 mmol/L   Glucose, Bld 147 (H) 70 - 99 mg/dL    Comment: Glucose reference range applies only to samples taken after fasting for at least 8 hours.   BUN 61 (H) 8 - 23  mg/dL   Creatinine, Ser 4.38 (H) 0.61 - 1.24 mg/dL   Calcium 8.3 (L) 8.9 - 10.3 mg/dL   GFR, Estimated 14 (L) >60 mL/min    Comment: (NOTE) Calculated using the CKD-EPI Creatinine Equation (2021)    Anion gap 9 5 - 15    Comment: Performed at [facility], [address], [place], [place] [address]  CBC     Status: Abnormal   Collection Time: 01/13/21  1:40 AM  Result Value Ref Range   WBC 8.5 4.0 - 10.5 K/uL   RBC 3.09 (L) 4.22 - 5.81 MIL/uL   Hemoglobin 8.7 (L) 13.0 - 17.0 g/dL   HCT 27.0 (L) 39.0 - 52.0 %   MCV 87.4 80.0 - 100.0 fL   MCH 28.2 26.0 - 34.0 pg   MCHC 32.2 30.0 - 36.0 g/dL   RDW 19.6 (H) 11.5 - 15.5 %   Platelets 201 150 - 400 K/uL   nRBC 0.0 0.0 - 0.2 %    Comment: Performed at [facility], [address], [place], [place] [address]  Glucose, capillary     Status: Abnormal   Collection Time: 01/13/21  7:04 AM  Result Value Ref Range   Glucose-Capillary 130 (H) 70 - 99 mg/dL    Comment: Glucose reference range applies only to samples taken after fasting for at least 8 hours.  Glucose, capillary     Status: Abnormal   Collection Time: 01/13/21 10:58 AM  Result Value Ref Range   Glucose-Capillary 151 (H) 70 - 99 mg/dL    Comment: Glucose reference range applies only to samples taken after fasting for at least 8 hours.  Glucose, capillary     Status: Abnormal   Collection Time: 01/13/21  4:09 PM  Result Value Ref Range   Glucose-Capillary 161 (H) 70 - 99 mg/dL    Comment: Glucose reference range applies only to samples taken after fasting for at least 8 hours.  Glucose, capillary     Status: Abnormal   Collection Time: 01/13/21  9:08 PM  Result Value Ref Range   Glucose-Capillary 124 (H) 70 - 99 mg/dL    Comment: Glucose reference range applies only to samples taken after fasting for at least 8 hours.  Glucose, capillary     Status: Abnormal   Collection Time: 01/14/21  7:25 AM  Result Value Ref Range   Glucose-Capillary 100 (H) 70 - 99 mg/dL    Comment:  Glucose reference range applies only to samples taken after fasting for at least 8 hours.  Glucose, capillary     Status: Abnormal   Collection Time: 01/14/21 11:18 AM  Result Value Ref Range   Glucose-Capillary 111 (H) 70 - 99 mg/dL    Comment: Glucose reference range applies only to samples taken after fasting for at least 8 hours.  Glucose, capillary     Status: None   Collection Time: 01/14/21 12:28 PM  Result Value Ref Range   Glucose-Capillary 93 70 - 99 mg/dL    Comment: Glucose reference range applies only to samples taken after fasting for at least 8 hours.    No results found.   Assessment & Plan:  Danny [name] is a 65 y.o. male with worsening kidney disease and need for dialysis. I have been asked to place a tunneled catheter. We discussed risk of bleeding, infection, injury to vessels, pneumothorax and he has opted to proceed.     All questions were answered to the satisfaction of the patient.     Virl Cagey 01/14/2021, 1:11 PM

## 2021-01-14 NOTE — Op Note (Signed)
Operative Note 01/14/21   Preoperative Diagnosis: End Stage Renal Disease    Postoperative Diagnosis: Same   Procedure(s) Performed: Tunneled Dialysis Catheter Placement, Right Internal Jugular    Surgeon: Lanell Matar. Constance Haw, MD   Assistants: No qualified resident was available   Anesthesia: Monitored anesthesia care   Anesthesiologist: Dr. Charna Elizabeth     Specimens: None   Estimated Blood Loss: Minimal   Blood Replacement: None    Complications: None    Operative Findings: Normal anatomy   Indications: Danny [name] is a 65 yo with worsening renal failure that needs to start dialysis. Discussed placement of a tunneled line and risk of bleeding, infection, injury to vessels, pneumothorax.   Procedure: The patient was brought into the operating room and monitored anesthesia was induced.   The right chest and neck was prepped and draped in the usual sterile fashion.  Preoperative antibiotics were given.   An Ultrasound was used to verify that the right internal jugular vein was patent.  One percent lidocaine was used for local anesthesia.  The patient was measured and a 23 cm Palindrome dual lumen dialysis catheter.  The needles advanced into the right internal jugular vein using the Seldinger technique without difficulty.  A guidewire was then advanced into the right atrium under fluoroscopic guidance.  Ectopia was not noted.  The wire was secured.  An incision was made over the right chest and the catheter was tunneled to the neck.  The ultrasound again confirmed the wire was going into the vein only. Dilators were used over the wire to dilate the track.  An introducer and peel-away sheath were placed over the guidewire. The catheter was then inserted through the peel-away sheath and the peel-away sheath was removed.  A spot film was performed to confirm the position.  The catheter drew back and flushed easily. The lumens were packed with heparin. Hemostats were used to position the catheter in  the neck incision. The neck incision was closed with 4-0 Monocryl and Dermabond. The catheter was secured with 2-0 silk suture and a sterile Biopatch and dressing was applied.  Hemostasis was confirmed.     All tape and needle counts were correct at the end of the procedure. The patient was transferred to PACU in stable condition. A chest x-ray will be performed at that time.  Danny Labrum, MD [facility] [address] [place], [place] [address] [phone] (office)

## 2021-01-14 NOTE — Progress Notes (Signed)
  [facility] Progress Note   Assessment/ Plan:    CKD V--> ESRD: will need to start dialysis based on OP plan and uremic symptoms.  [place] for today and HD #1 tomorrow (Saturday).  Will need CLIP- I am told he will be going to [facility] and they are requesting permanent access before d/c- will need to contact [facility] for planning purposes.   Symptomatic anemia: will need resumption of ESA- will do aranesp HTN: reasonable control DM II: per primary Bone/ mineral: Phos 5 Venous stasis ulcer: has unna boots Dispo: admitted  Subjective:    Down in OR for [facility] placement- labs and charting reviewed.  Will need HD #1 tomorrow (Saturday)   Objective:   BP 117/76   Pulse (!) 110 Comment: PT pulse showed 110  Temp 98.3 F (36.8 C) (Oral)   Resp 16   Ht 6\' 1"  (1.854 m)   Wt 129.3 kg   SpO2 98%   BMI 37.60 kg/m   Physical Exam: Unavailable for exam  Labs: BMET Recent Labs  Lab 01/12/21 1202 01/13/21 0140  NA 135 136  K 4.1 4.1  CL 104 104  CO2 25 23  GLUCOSE 153* 147*  BUN 64* 61*  CREATININE 4.34* 4.38*  CALCIUM 8.4* 8.3*  PHOS  --  5.0*   CBC Recent Labs  Lab 01/10/21 1017 01/12/21 1202 01/13/21 0140  WBC 6.6 7.4 8.5  NEUTROABS 5.2 5.7  --   HGB 8.1* 7.0* 8.7*  HCT 25.8* 22.3* 27.0*  MCV 86.9 87.8 87.4  PLT 219 178 201      Medications:     [MAR Hold] amLODipine  10 mg Oral Daily   [MAR Hold] aspirin  325 mg Oral Daily   chlorhexidine  60 mL Topical Once   And   [START ON 01/15/2021] chlorhexidine  60 mL Topical Once   Chlorhexidine Gluconate Cloth  6 each Topical Once   [MAR Hold] cholecalciferol  1,000 Units Oral Daily   [MAR Hold] feeding supplement (NEPRO CARB STEADY)  237 mL Oral BID BM   [MAR Hold] fluticasone  1 spray Each Nare Daily   [MAR Hold] heparin  5,000 Units Subcutaneous Q8H   [MAR Hold] hydrALAZINE  100 mg Oral BID   [MAR Hold]  HYDROmorphone (DILAUDID) injection  0.5 mg Intravenous Once   [MAR Hold] insulin aspart  0-9  Units Subcutaneous TID WC   [MAR Hold] insulin aspart protamine- aspart  13 Units Subcutaneous BID WC   [MAR Hold] metoprolol succinate  25 mg Oral QPM   [MAR Hold] nystatin   Topical TID   [MAR Hold] pravastatin  80 mg Oral QPM   [MAR Hold] sevelamer carbonate  800 mg Oral BID WC   [MAR Hold] torsemide  50 mg Oral BID   [MAR Hold] vitamin B-12  1,000 mcg Oral Daily   [MAR Hold] vitamin E  800 Units Oral Daily   [MAR Hold] zinc sulfate  220 mg Oral Daily     Madelon Lips MD 01/14/2021, 1:34 PM

## 2021-01-14 NOTE — Transfer of Care (Signed)
Immediate Anesthesia Transfer of Care Note  Patient: Danny [name]  Procedure(s) Performed: INSERTION OF TUNNELED DIALYSIS CATHETER RIGHT INTERNAL JUGULAR (Right)  Patient Location: PACU  Anesthesia Type:MAC  Level of Consciousness: awake, alert  and oriented  Airway & Oxygen Therapy: Patient Spontanous Breathing and Patient connected to nasal cannula oxygen  Post-op Assessment: Report given to RN and Post -op Vital signs reviewed and stable  Post vital signs: Reviewed and stable  Last Vitals:  Vitals Value Taken Time  BP 120/83 01/14/21 1416  Temp    Pulse 100 01/14/21 1417  Resp 18 01/14/21 1417  SpO2 100 % 01/14/21 1417  Vitals shown include unvalidated device data.  Last Pain:  Vitals:   01/14/21 1255  TempSrc:   PainSc: 0-No pain         Complications: No notable events documented.

## 2021-01-14 NOTE — Progress Notes (Signed)
PROGRESS NOTE    Danny [name]  VQM:[id number] DOB: [birth year] DOA: 01/12/2021 PCP: Celene Squibb, MD   Chief Complaint  Patient presents with   Weakness    Brief admission narrative:  As per H&P written by Dr. Marily Memos on 01/12/2021 Danny [name] is a 65 y.o. male with medical history significant of Anemia of Chronic disease, CKD stage 5, DM, Chronic LE ulcers from venous stasis dermatitis and DM, HTN, Urinary retention. Pt states he has been an overall physical decline for several days w/o acute complaint. On day of admission pt unable to ambulate from bed and care for self. Profound generalized weakness. Denies CP, SOB, palpitations, ABD pain, dysuria, Frequency, rash, n/v, HA, fever, Cough, hematochezia, melena. Still on Augmentin for Bronchitis that has essentially resolved per pt. Started on 01/04/21. Leg wounds (chronic) were wrapped by home health aid on 01/10/21 per pt. No change in medication recenlyt and endorses compliance.    ED Course: Objective findings below. Started on 2 units PRBC infusion   Assessment & Plan: 1-Symptomatic anemia -And with underlying history of a stage V renal failure and anemia of chronic kidney disease. -Hemoglobin down to 7.0 causing symptomatic anemia. -2 units PRBCs have been given -Hemoglobin up to 8.7 and has remained stable currently -Will continue to follow hemoglobin trend -IV iron and Epogen therapy as per renal function discretion. -No overt bleeding appreciated.  2-Diabetes mellitus ([place]) -will follow CBG's and adjust hypoglycemic regimen accordingly. -continue SSI  3-Chronic diastolic CHF (congestive heart failure) (HCC) -Continue daily weights and low-sodium diet -Patient in need of dialysis initiation -Volume will be managed with dialysis treatment.  4-stage V renal failure -Patient was due for temporary catheter placement and dialysis initiation as an outpatient. -General surgery planning for dialysis catheter placement later today;  once in place we will follow nephrology service recommendation for dialysis initiation. -Continue to follow renal diet, strict I's and O's and daily weight.  5-secondary hyperparathyroidism -Continue the use of Renvela -Will follow nephrology further recommendations -Follow electrolytes stability.    6-history of bronchitis/community-acquired pneumonia -Continue as needed bronchodilators -Patient has completed oral antibiotic and is currently afebrile. -Continue as needed antitussive/vasculitic agents and the use of flutter valve. -Wean off oxygen supplementation as tolerated.  7-essential hypertension -Continue current antihypertensive agents -Heart healthy diet has been encouraged.  8-hyperlipidemia -Continue statins. -Heart healthy diet encouraged.   DVT prophylaxis: (Lovenox/Heparin/SCD's/anticoagulated/None (if comfort care) Code Status: (Full/Partial - specify details) Family Communication: (Specify name, relationship & date discussed. NO "discussed with patient") Disposition:   Status is: Inpatient  Remains inpatient appropriate because:IV treatments appropriate due to intensity of illness or inability to take PO  Dispo: The patient is from: Home              Anticipated d/c is to: SNF              Patient currently is not medically stable to d/c.   Difficult to place patient Yes    Consultants:  Nephrology service General surgery  Procedures:  See below for x-ray reports  Antimicrobials:  Augmentin last dose 01/13/2021.   Subjective: Feeling weak, tired, expressing being hungry and experiencing shortness of breath with activity.  No chest pain, no nausea, no vomiting.  Objective: Vitals:   01/14/21 1500 01/14/21 1515 01/14/21 1530 01/14/21 1549  BP: 124/76 123/83  133/84  Pulse: 97 100 (!) 103 (!) 101  Resp: 17 18 15 19   Temp:    98.2 F (36.8 C)  TempSrc:  SpO2: 96% 95% 97% 99%  Weight:      Height:        Intake/Output Summary (Last 24  hours) at 01/14/2021 1819 Last data filed at 01/14/2021 1500 Gross per 24 hour  Intake 600 ml  Output 2055 ml  Net -1455 ml   Filed Weights   01/12/21 1033  Weight: 129.3 kg    Examination: General exam: Alert, awake, oriented x 3; reports feeling weak, deconditioned and is slightly short of breath with minimal activity.  2 L nasal cannula supplementation in place. Respiratory system: Positive scattered rhonchi; decreased air movement at the bases.  No wheezing. Cardiovascular system:RRR.  No rubs, no gallops, no JVD appreciated on exam. Gastrointestinal system: Abdomen is soft, nontender, nondistended, positive bowel sounds. Central nervous system: Alert and oriented. No focal neurological deficits. Extremities: No cyanosis or clubbing.  Chronic 1+ edema appreciated bilaterally and Unna boots in place. Skin: No petechiae. Psychiatry: Judgement and insight appear normal. Mood & affect appropriate.      Data Reviewed: I have personally reviewed following labs and imaging studies  CBC: Recent Labs  Lab 01/10/21 1017 01/12/21 1202 01/13/21 0140 01/14/21 1552  WBC 6.6 7.4 8.5 11.6*  NEUTROABS 5.2 5.7  --   --   HGB 8.1* 7.0* 8.7* 8.7*  HCT 25.8* 22.3* 27.0* 27.4*  MCV 86.9 87.8 87.4 87.3  PLT 219 178 201 703    Basic Metabolic Panel: Recent Labs  Lab 01/12/21 1202 01/13/21 0140 01/14/21 1552  NA 135 136 134*  K 4.1 4.1 3.9  CL 104 104 103  CO2 25 23 25   GLUCOSE 153* 147* 116*  BUN 64* 61* 62*  CREATININE 4.34* 4.38* 4.78*  CALCIUM 8.4* 8.3* 8.0*  MG  --  2.7*  --   PHOS  --  5.0* 5.0*    GFR: Estimated Creatinine Clearance: 21.1 mL/min (A) (by C-G formula based on SCr of 4.78 mg/dL (H)).  Liver Function Tests: Recent Labs  Lab 01/12/21 1202 01/14/21 1552  AST 18  --   ALT 16  --   ALKPHOS 120  --   BILITOT 1.0  --   PROT 7.2  --   ALBUMIN 2.8* 2.5*    CBG: Recent Labs  Lab 01/14/21 0725 01/14/21 1118 01/14/21 1228 01/14/21 1419  01/14/21 1607  GLUCAP 100* 111* 93 81 106*     Recent Results (from the past 240 hour(s))  Resp Panel by RT-PCR (Flu A&B, Covid) Nasopharyngeal Swab     Status: None   Collection Time: 01/12/21 12:11 PM   Specimen: Nasopharyngeal Swab; Nasopharyngeal(NP) swabs in vial transport medium  Result Value Ref Range Status   SARS Coronavirus 2 by RT PCR NEGATIVE NEGATIVE Final    Comment: (NOTE) SARS-CoV-2 target nucleic acids are NOT DETECTED.  The SARS-CoV-2 RNA is generally detectable in upper respiratory specimens during the acute phase of infection. The lowest concentration of SARS-CoV-2 viral copies this assay can detect is 138 copies/mL. A negative result does not preclude SARS-Cov-2 infection and should not be used as the sole basis for treatment or other patient management decisions. A negative result may occur with  improper specimen collection/handling, submission of specimen other than nasopharyngeal swab, presence of viral mutation(s) within the areas targeted by this assay, and inadequate number of viral copies(<138 copies/mL). A negative result must be combined with clinical observations, patient history, and epidemiological information. The expected result is Negative.  Fact Sheet for Patients:  EntrepreneurPulse.com.au  Fact Sheet for Healthcare Providers:  IncredibleEmployment.be  This test is no t yet approved or cleared by the [place] and  has been authorized for detection and/or diagnosis of SARS-CoV-2 by FDA under an Emergency Use Authorization (EUA). This EUA will remain  in effect (meaning this test can be used) for the duration of the COVID-19 declaration under Section 564(b)(1) of the Act, 21 U.S.C.section 360bbb-3(b)(1), unless the authorization is terminated  or revoked sooner.       Influenza A by PCR NEGATIVE NEGATIVE Final   Influenza B by PCR NEGATIVE NEGATIVE Final    Comment: (NOTE) The Xpert Xpress  SARS-CoV-2/FLU/RSV plus assay is intended as an aid in the diagnosis of influenza from Nasopharyngeal swab specimens and should not be used as a sole basis for treatment. Nasal washings and aspirates are unacceptable for Xpert Xpress SARS-CoV-2/FLU/RSV testing.  Fact Sheet for Patients: EntrepreneurPulse.com.au  Fact Sheet for Healthcare Providers: IncredibleEmployment.be  This test is not yet approved or cleared by the [place] FDA and has been authorized for detection and/or diagnosis of SARS-CoV-2 by FDA under an Emergency Use Authorization (EUA). This EUA will remain in effect (meaning this test can be used) for the duration of the COVID-19 declaration under Section 564(b)(1) of the Act, 21 U.S.C. section 360bbb-3(b)(1), unless the authorization is terminated or revoked.  Performed at [facility], [address] [place],  [address]      Radiology Studies: [facility] Chest [facility] 1 View  Result Date: 01/14/2021 CLINICAL DATA:  Vascular dialysis catheter in place. EXAM: PORTABLE CHEST 1 VIEW COMPARISON:  Radiograph 01/12/2021. FINDINGS: New right internal jugular central venous catheter tip overlies the lower SVC. No pneumothorax. Stable cardiomegaly. Slight worsening right basilar aeration and blunting of the costophrenic angle, suspicious for increasing pleural effusion and atelectasis. Vascular congestion. IMPRESSION: 1. New right internal jugular central venous catheter with tip overlying the lower SVC. No pneumothorax. 2. Slight worsening right basilar aeration and blunting of the costophrenic angle, suspicious for increasing pleural effusion and atelectasis. Unchanged cardiomegaly. Electronically Signed   By: Keith Rake M.D.   On: 01/14/2021 15:06   DG C-Arm 1-60 Min-No Report  Result Date: 01/14/2021 Fluoroscopy was utilized by the requesting physician.  No radiographic interpretation.     Scheduled Meds:  amLODipine  10 mg Oral  Daily   aspirin  325 mg Oral Daily   [START ON 01/15/2021] Chlorhexidine Gluconate Cloth  6 each Topical Q0600   cholecalciferol  1,000 Units Oral Daily   feeding supplement (NEPRO CARB STEADY)  237 mL Oral BID BM   fluticasone  1 spray Each Nare Daily   heparin  5,000 Units Subcutaneous Q8H   hydrALAZINE  100 mg Oral BID    HYDROmorphone (DILAUDID) injection  0.5 mg Intravenous Once   insulin aspart  0-9 Units Subcutaneous TID WC   insulin aspart protamine- aspart  13 Units Subcutaneous BID WC   metoprolol succinate  25 mg Oral QPM   nystatin   Topical TID   pravastatin  80 mg Oral QPM   sevelamer carbonate  800 mg Oral BID WC   torsemide  50 mg Oral BID   vitamin B-12  1,000 mcg Oral Daily   vitamin E  800 Units Oral Daily   zinc sulfate  220 mg Oral Daily   Continuous Infusions:  sodium chloride     sodium chloride     methocarbamol (ROBAXIN) IV       LOS: 2 days    Time spent: 35 minutes  Barton Dubois, MD [facility]   To contact the attending provider between 7A-7P or the covering provider during after hours 7P-7A, please log into the web site [email or web address] and access using universal [place] password for that web site. If you do not have the password, please call the hospital operator.  01/14/2021, 6:19 PM

## 2021-01-14 NOTE — Anesthesia Preprocedure Evaluation (Signed)
Anesthesia Evaluation  Patient identified by MRN, date of birth, ID band Patient awake    Reviewed: Allergy & Precautions, NPO status , Patient's Chart, lab work & pertinent test results, reviewed documented beta blocker date and time   History of Anesthesia Complications Negative for: history of anesthetic complications  Airway Mallampati: III  TM Distance: >3 FB Neck ROM: Full    Dental  (+) Edentulous Upper, Missing, Dental Advisory Given, Poor Dentition,    Pulmonary shortness of breath (on oxygen on floor) and with exertion, sleep apnea (snoring) , former smoker,    Pulmonary exam normal breath sounds clear to auscultation       Cardiovascular Exercise Tolerance: Poor hypertension, Pt. on medications and Pt. on home beta blockers +CHF and + DOE  Normal cardiovascular exam Rhythm:Regular Rate:Normal  1. The left ventricle has hyperdynamic systolic function, with an  ejection fraction of >65%. The cavity size was normal. There is mildly  increased left ventricular wall thickness. Possible restrictive diastolic  filling pattern. No evidence of left  ventricular regional wall motion abnormalities.  2. The right ventricle has low normal systolic function. The cavity was  normal. There is no increase in right ventricular wall thickness. Right  ventricular systolic pressure is severely elevated with an estimated  pressure of 63.0 mmHg.  3. Left atrial size was moderately dilated.  4. The aortic valve is tricuspid. Moderate aortic annular calcification  noted.  5. The mitral valve is grossly normal. There is mild mitral annular  calcification present.  6. The tricuspid valve is grossly normal.  7. The aorta is normal unless otherwise noted.  8. The inferior vena cava was dilated in size with <50% respiratory  variability.    Neuro/Psych  Neuromuscular disease (diabetic neuropathy)    GI/Hepatic negative GI ROS, Neg  liver ROS,   Endo/Other  diabetes, Well Controlled, Type 2, Insulin DependentMorbid obesity  Renal/GU ESRFRenal disease     Musculoskeletal  (+) Arthritis ,   Abdominal   Peds  Hematology  (+) Blood dyscrasia, anemia ,   Anesthesia Other Findings   Reproductive/Obstetrics                            Anesthesia Physical  Anesthesia Plan  ASA: 4  Anesthesia Plan: General   Post-op Pain Management:    Induction: Intravenous  PONV Risk Score and Plan: 3 and Ondansetron  Airway Management Planned: Nasal Cannula, Natural Airway and Simple Face Mask  Additional Equipment:   Intra-op Plan:   Post-operative Plan: Possible Post-op intubation/ventilation  Informed Consent: I have reviewed the patients History and Physical, chart, labs and discussed the procedure including the risks, benefits and alternatives for the proposed anesthesia with the patient or authorized representative who has indicated his/her understanding and acceptance.     Dental advisory given  Plan Discussed with: CRNA and Surgeon  Anesthesia Plan Comments:         Anesthesia Quick Evaluation

## 2021-01-15 DIAGNOSIS — E1122 Type 2 diabetes mellitus with diabetic chronic kidney disease: Secondary | ICD-10-CM | POA: Diagnosis not present

## 2021-01-15 DIAGNOSIS — D649 Anemia, unspecified: Secondary | ICD-10-CM | POA: Diagnosis not present

## 2021-01-15 DIAGNOSIS — I5032 Chronic diastolic (congestive) heart failure: Secondary | ICD-10-CM | POA: Diagnosis not present

## 2021-01-15 DIAGNOSIS — E1121 Type 2 diabetes mellitus with diabetic nephropathy: Secondary | ICD-10-CM | POA: Diagnosis not present

## 2021-01-15 LAB — CBC
HCT: 25.2 % — ABNORMAL LOW (ref 39.0–52.0)
Hemoglobin: 7.7 g/dL — ABNORMAL LOW (ref 13.0–17.0)
MCH: 27.2 pg (ref 26.0–34.0)
MCHC: 30.6 g/dL (ref 30.0–36.0)
MCV: 89 fL (ref 80.0–100.0)
Platelets: 176 10*3/uL (ref 150–400)
RBC: 2.83 MIL/uL — ABNORMAL LOW (ref 4.22–5.81)
RDW: 18.9 % — ABNORMAL HIGH (ref 11.5–15.5)
WBC: 9.3 10*3/uL (ref 4.0–10.5)
nRBC: 0 % (ref 0.0–0.2)

## 2021-01-15 LAB — RENAL FUNCTION PANEL
Albumin: 2.3 g/dL — ABNORMAL LOW (ref 3.5–5.0)
Anion gap: 8 (ref 5–15)
BUN: 67 mg/dL — ABNORMAL HIGH (ref 8–23)
CO2: 26 mmol/L (ref 22–32)
Calcium: 8.1 mg/dL — ABNORMAL LOW (ref 8.9–10.3)
Chloride: 102 mmol/L (ref 98–111)
Creatinine, Ser: 4.94 mg/dL — ABNORMAL HIGH (ref 0.61–1.24)
GFR, Estimated: 12 mL/min — ABNORMAL LOW (ref >60)
Glucose, Bld: 103 mg/dL — ABNORMAL HIGH (ref 70–99)
Phosphorus: 5.8 mg/dL — ABNORMAL HIGH (ref 2.5–4.6)
Potassium: 4.3 mmol/L (ref 3.5–5.1)
Sodium: 136 mmol/L (ref 135–145)

## 2021-01-15 LAB — GLUCOSE, CAPILLARY
Glucose-Capillary: 282 mg/dL — ABNORMAL HIGH (ref 70–99)
Glucose-Capillary: 156 mg/dL — ABNORMAL HIGH (ref 70–99)
Glucose-Capillary: 136 mg/dL — ABNORMAL HIGH (ref 70–99)
Glucose-Capillary: 146 mg/dL — ABNORMAL HIGH (ref 70–99)

## 2021-01-15 MED ORDER — GLUCERNA SHAKE PO LIQD
237.0000 mL | Freq: Two times a day (BID) | ORAL | Status: DC
  Administered 2021-01-15 – 2021-01-27 (×12): 237 mL via ORAL

## 2021-01-15 MED ORDER — SODIUM CHLORIDE 0.9 % IV SOLN
250.0000 mg | Freq: Every day | INTRAVENOUS | Status: AC
  Administered 2021-01-15 – 2021-01-18 (×4): 250 mg via INTRAVENOUS
  Filled 2021-01-15 (×4): qty 20

## 2021-01-15 MED ORDER — HEPARIN SODIUM (PORCINE) 1000 UNIT/ML DIALYSIS
3800.0000 [IU] | INTRAMUSCULAR | Status: DC | PRN
  Administered 2021-01-22 – 2021-01-27 (×2): 3800 [IU] via INTRAVENOUS_CENTRAL

## 2021-01-15 MED ORDER — DARBEPOETIN ALFA 100 MCG/0.5ML IJ SOSY
100.0000 ug | PREFILLED_SYRINGE | INTRAMUSCULAR | Status: DC
  Administered 2021-01-17: 100 ug via INTRAVENOUS
  Filled 2021-01-15 (×2): qty 0.5

## 2021-01-15 MED ORDER — HEPARIN SODIUM (PORCINE) 1000 UNIT/ML IJ SOLN
INTRAMUSCULAR | Status: AC
  Administered 2021-01-15: 3800 [IU] via INTRAVENOUS_CENTRAL
  Filled 2021-01-15: qty 4

## 2021-01-15 NOTE — Progress Notes (Signed)
   01/15/21 1726  Vitals  Temp 98 F (36.7 C)  Temp Source Oral  BP (!) 108/50  BP Location Left Arm  BP Method Automatic  Patient Position (if appropriate) Lying  Pulse Rate (!) 112  Pulse Rate Source Dinamap  Resp 20  MEWS COLOR  MEWS Score Color Yellow  Oxygen Therapy  SpO2 97 %  O2 Device Nasal Cannula  O2 Flow Rate (L/min) 4 L/min  Pain Assessment  Pain Score 0  MEWS Score  MEWS Temp 0  MEWS Systolic 0  MEWS Pulse 2  MEWS RR 0  MEWS LOC 0  MEWS Score 2  Provider Notification  Provider Name/Title MD [facility]  Date Provider Notified 01/15/21  Time Provider Notified 7654  Notification Type  (Secure chat)  Notification Reason Change in status  Provider response Other (Comment) (From secure chat, continue to monitor)  Date of Provider Response 01/15/21  Time of Provider Response 1748  Note  Observations  (Patient eating dinner and watching TV.)

## 2021-01-15 NOTE — Progress Notes (Signed)
PROGRESS NOTE    Danny [name]  OHY:[id number] DOB: 01/27/1956 DOA: 01/12/2021 PCP: Celene Squibb, MD   Chief Complaint  Patient presents with   Weakness    Brief admission narrative:  As per H&P written by Dr. Marily Memos on 01/12/2021 Danny [name] is a 65 y.o. male with medical history significant of Anemia of Chronic disease, CKD stage 5, DM, Chronic LE ulcers from venous stasis dermatitis and DM, HTN, Urinary retention. Pt states he has been an overall physical decline for several days w/o acute complaint. On day of admission pt unable to ambulate from bed and care for self. Profound generalized weakness. Denies CP, SOB, palpitations, ABD pain, dysuria, Frequency, rash, n/v, HA, fever, Cough, hematochezia, melena. Still on Augmentin for Bronchitis that has essentially resolved per pt. Started on 01/04/21. Leg wounds (chronic) were wrapped by home health aid on 01/10/21 per pt. No change in medication recenlyt and endorses compliance.    ED Course: Objective findings below. Started on 2 units PRBC infusion   Assessment & Plan: 1-Symptomatic anemia -And with underlying history of a stage V renal failure and anemia of chronic kidney disease. -Hemoglobin down to 7.0 causing symptomatic anemia. -2 units PRBCs have been given -Hemoglobin up to 8.7 and has remained stable currently -Will continue to follow hemoglobin trend -IV iron and Epogen therapy as per renal function discretion. -No overt bleeding appreciated.  2-Diabetes mellitus ([place]) -will follow CBG's and adjust hypoglycemic regimen accordingly. -continue SSI  3-Chronic diastolic CHF (congestive heart failure) (HCC) -Low-sodium diet encouraged. -Patient in need of dialysis initiation -Volume to be managed with dialysis treatment. -Continue to follow daily weights and strict I's and O's.  4-stage V renal failure -Patient was due for temporary catheter placement and dialysis initiation as an outpatient. -General surgery has placed  dialysis catheter 7/22; patient will have his first dialysis treatment later today 7/23. -Continue to follow renal diet, strict I's and O's and daily weight.  5-secondary hyperparathyroidism -Continue the use of Renvela -Will follow nephrology further recommendations -Follow electrolytes stability.    6-history of bronchitis/community-acquired pneumonia -Continue as needed bronchodilators -Patient has completed oral antibiotic and is currently afebrile. -Continue as needed antitussive/vasculitic agents and the use of flutter valve. -Will continue to wean off oxygen supplementation as tolerated.  7-essential hypertension -Continue current antihypertensive agents -Heart healthy diet has been encouraged.  8-hyperlipidemia -Continue statins. -Heart healthy diet encouraged.   DVT prophylaxis: Heparin Code Status: Full code Family Communication: No family at bedside. Disposition:   Status is: Inpatient  Remains inpatient appropriate because:IV treatments appropriate due to intensity of illness or inability to take PO  Dispo: The patient is from: Home              Anticipated d/c is to: SNF              Patient currently is not medically stable to d/c.   Difficult to place patient Yes    Consultants:  Nephrology service General surgery  Procedures:  See below for x-ray reports  Antimicrobials:  Augmentin last dose 01/13/2021.   Subjective: Feeling weak and deconditioned; no chest pain, no nausea or vomiting.  Patient expressed mild orthopnea and feeling short of breath with activity.  2 L nasal cannula in place.  Objective: Vitals:   01/15/21 1615 01/15/21 1630 01/15/21 1645 01/15/21 1700  BP: (!) 106/56 119/60 124/60 118/64  Pulse: (!) 112 (!) 113 (!) 110 (!) 112  Resp:    16  Temp:  98 F (36.7 C)  TempSrc:    Oral  SpO2:    95%  Weight:    (!) 137.3 kg  Height:        Intake/Output Summary (Last 24 hours) at 01/15/2021 1723 Last data filed at 01/15/2021  1650 Gross per 24 hour  Intake 737 ml  Output 555 ml  Net 182 ml   Filed Weights   01/12/21 1033 01/15/21 1415 01/15/21 1700  Weight: 129.3 kg (!) 137.2 kg (!) 137.3 kg    Examination: General exam: Alert, awake, oriented x 3; no chest pain, no nausea or vomiting.  Reports feeling slightly short of breath and expressed orthopnea.  2 L nasal cannula in place.  In no major distress. Respiratory system: Decreased breath sounds at the bases; no using accessory muscle.  No wheezing. Cardiovascular system:RRR. No murmurs, rubs, gallops.  Unable to properly assess JVD with body habitus. Gastrointestinal system: Abdomen is obese, nondistended, soft and nontender. No organomegaly or masses felt. Normal bowel sounds heard. Central nervous system: Alert and oriented. No focal neurological deficits. Extremities: No cyanosis or clubbing; Unna boots in place.  Chronic 1+ edema bilaterally appreciated. Skin: No petechiae. Psychiatry: Judgement and insight appear normal. Mood & affect appropriate.    Data Reviewed: I have personally reviewed following labs and imaging studies  CBC: Recent Labs  Lab 01/10/21 1017 01/12/21 1202 01/13/21 0140 01/14/21 1552 01/15/21 1512  WBC 6.6 7.4 8.5 11.6* 9.3  NEUTROABS 5.2 5.7  --   --   --   HGB 8.1* 7.0* 8.7* 8.7* 7.7*  HCT 25.8* 22.3* 27.0* 27.4* 25.2*  MCV 86.9 87.8 87.4 87.3 89.0  PLT 219 178 201 184 366    Basic Metabolic Panel: Recent Labs  Lab 01/12/21 1202 01/13/21 0140 01/14/21 1552 01/15/21 1512  NA 135 136 134* 136  K 4.1 4.1 3.9 4.3  CL 104 104 103 102  CO2 25 23 25 26   GLUCOSE 153* 147* 116* 103*  BUN 64* 61* 62* 67*  CREATININE 4.34* 4.38* 4.78* 4.94*  CALCIUM 8.4* 8.3* 8.0* 8.1*  MG  --  2.7*  --   --   PHOS  --  5.0* 5.0* 5.8*    GFR: Estimated Creatinine Clearance: 21.1 mL/min (A) (by C-G formula based on SCr of 4.94 mg/dL (H)).  Liver Function Tests: Recent Labs  Lab 01/12/21 1202 01/14/21 1552 01/15/21 1512   AST 18  --   --   ALT 16  --   --   ALKPHOS 120  --   --   BILITOT 1.0  --   --   PROT 7.2  --   --   ALBUMIN 2.8* 2.5* 2.3*    CBG: Recent Labs  Lab 01/14/21 1607 01/14/21 2200 01/15/21 0730 01/15/21 1112 01/15/21 1714  GLUCAP 106* 107* 282* 156* 136*     Recent Results (from the past 240 hour(s))  Resp Panel by RT-PCR (Flu A&B, Covid) Nasopharyngeal Swab     Status: None   Collection Time: 01/12/21 12:11 PM   Specimen: Nasopharyngeal Swab; Nasopharyngeal(NP) swabs in vial transport medium  Result Value Ref Range Status   SARS Coronavirus 2 by RT PCR NEGATIVE NEGATIVE Final    Comment: (NOTE) SARS-CoV-2 target nucleic acids are NOT DETECTED.  The SARS-CoV-2 RNA is generally detectable in upper respiratory specimens during the acute phase of infection. The lowest concentration of SARS-CoV-2 viral copies this assay can detect is 138 copies/mL. A negative result does not preclude SARS-Cov-2 infection and  should not be used as the sole basis for treatment or other patient management decisions. A negative result may occur with  improper specimen collection/handling, submission of specimen other than nasopharyngeal swab, presence of viral mutation(s) within the areas targeted by this assay, and inadequate number of viral copies(<138 copies/mL). A negative result must be combined with clinical observations, patient history, and epidemiological information. The expected result is Negative.  Fact Sheet for Patients:  EntrepreneurPulse.com.au  Fact Sheet for Healthcare Providers:  IncredibleEmployment.be  This test is no t yet approved or cleared by the [place] FDA and  has been authorized for detection and/or diagnosis of SARS-CoV-2 by FDA under an Emergency Use Authorization (EUA). This EUA will remain  in effect (meaning this test can be used) for the duration of the COVID-19 declaration under Section 564(b)(1) of the Act,  21 U.S.C.section 360bbb-3(b)(1), unless the authorization is terminated  or revoked sooner.       Influenza A by PCR NEGATIVE NEGATIVE Final   Influenza B by PCR NEGATIVE NEGATIVE Final    Comment: (NOTE) The Xpert Xpress SARS-CoV-2/FLU/RSV plus assay is intended as an aid in the diagnosis of influenza from Nasopharyngeal swab specimens and should not be used as a sole basis for treatment. Nasal washings and aspirates are unacceptable for Xpert Xpress SARS-CoV-2/FLU/RSV testing.  Fact Sheet for Patients: EntrepreneurPulse.com.au  Fact Sheet for Healthcare Providers: IncredibleEmployment.be  This test is not yet approved or cleared by the [place] FDA and has been authorized for detection and/or diagnosis of SARS-CoV-2 by FDA under an Emergency Use Authorization (EUA). This EUA will remain in effect (meaning this test can be used) for the duration of the COVID-19 declaration under Section 564(b)(1) of the Act, 21 U.S.C. section 360bbb-3(b)(1), unless the authorization is terminated or revoked.  Performed at [facility], [address] [place], [place] [address]      Radiology Studies: [facility] Chest [facility] 1 View  Result Date: 01/14/2021 CLINICAL DATA:  Vascular dialysis catheter in place. EXAM: PORTABLE CHEST 1 VIEW COMPARISON:  Radiograph 01/12/2021. FINDINGS: New right internal jugular central venous catheter tip overlies the lower SVC. No pneumothorax. Stable cardiomegaly. Slight worsening right basilar aeration and blunting of the costophrenic angle, suspicious for increasing pleural effusion and atelectasis. Vascular congestion. IMPRESSION: 1. New right internal jugular central venous catheter with tip overlying the lower SVC. No pneumothorax. 2. Slight worsening right basilar aeration and blunting of the costophrenic angle, suspicious for increasing pleural effusion and atelectasis. Unchanged cardiomegaly. Electronically Signed   By: Keith Rake M.D.   On: 01/14/2021 15:06   DG C-Arm 1-60 Min-No Report  Result Date: 01/14/2021 Fluoroscopy was utilized by the requesting physician.  No radiographic interpretation.     Scheduled Meds:  amLODipine  10 mg Oral Daily   aspirin  325 mg Oral Daily   Chlorhexidine Gluconate Cloth  6 each Topical Q0600   cholecalciferol  1,000 Units Oral Daily   [START ON 01/17/2021] darbepoetin (ARANESP) injection - DIALYSIS  100 mcg Intravenous Q Mon-HD   feeding supplement (GLUCERNA SHAKE)  237 mL Oral BID BM   fluticasone  1 spray Each Nare Daily   heparin  5,000 Units Subcutaneous Q8H   hydrALAZINE  100 mg Oral BID    HYDROmorphone (DILAUDID) injection  0.5 mg Intravenous Once   insulin aspart  0-9 Units Subcutaneous TID WC   insulin aspart protamine- aspart  13 Units Subcutaneous BID WC   metoprolol succinate  25 mg Oral QPM  nystatin   Topical TID   pravastatin  80 mg Oral QPM   sevelamer carbonate  800 mg Oral BID WC   torsemide  50 mg Oral BID   vitamin B-12  1,000 mcg Oral Daily   vitamin E  800 Units Oral Daily   zinc sulfate  220 mg Oral Daily   Continuous Infusions:  sodium chloride     sodium chloride     ferric gluconate (FERRLECIT) IVPB Stopped (01/15/21 1705)   methocarbamol (ROBAXIN) IV       LOS: 3 days    Time spent: 35 minutes   Barton Dubois, MD [facility]   To contact the attending provider between 7A-7P or the covering provider during after hours 7P-7A, please log into the web site [email or web address] and access using universal [place] password for that web site. If you do not have the password, please call the hospital operator.  01/15/2021, 5:23 PM

## 2021-01-15 NOTE — Procedures (Signed)
   INITIAL HEMODIALYSIS TREATMENT NOTE:  Indications / risks / benefits of renal replacement therapy were discussed prior to obtaining consent for first ever hemodialysis treatment on 01/15/21.  We talked about need to eventually try dialyzing in a recliner.  Pt states he hasn't done much PT, that he feels "very deconditioned and weak".  HD was performed in bed today; will try to coordinate with PT on Monday and attempt HD in recliner, if possible.  "I want to get stronger, that's why I'm going to rehab", but thinks he might need Hoyer lift short-term.  Catheter entry site with some old sanguinous drainage.  Dressing changed pre-treatment; dressing clean+dry post-session. UF was limited by hypotension therefore kept even with only 55 cc removed - Dr. Moshe Cipro was notified.  HBsAg, HBsAb, and Hb Core/total Ab were collected today; currently in process.  Rockwell Alexandria, RN

## 2021-01-15 NOTE — Progress Notes (Signed)
  [name] Progress Note   Assessment/ Plan:    CKD V--> ESRD: will need to start dialysis based on OP plan and uremic symptoms.  [facility] 7/22 and HD #1 today (Saturday), plan for tx #2 Monday.  Will need CLIP- I am told he will be going to [facility] and they are requesting permanent access before d/c- will need to contact [facility] for planning purposes.   Symptomatic anemia: will need resumption of ESA- will do aranesp-  also needs iron  HTN: reasonable control- norvasc 10/hydralazine 100 and topril-  may be able to wean BP meds as volume gets under better control  DM II: per primary Bone/ mineral: Phos 5- renvela being given-  no recent PTH-  will check one  Venous stasis ulcer: has unna boots   Renal will not physically see on Sunday-  will revisit on Monday-  call with questions   Subjective:    S/p TDC placement -  some bleeding but now controlled -  he looks pretty ill    Objective:   BP 132/75   Pulse (!) 108   Temp 98.6 F (37 C) Comment: taken at 0550. Did not validate until 0600  Resp 20   Ht 6\' 1"  (1.854 m)   Wt 129.3 kg   SpO2 97%   BMI 37.60 kg/m   Physical Exam: Gen- pale, difficult to get words out, very weak Chest-  dec BS at bases-  saturated dressing from [facility] Abd-  obese, soft, non tender Ext-  pitting edema   Labs: BMET Recent Labs  Lab 01/12/21 1202 01/13/21 0140 01/14/21 1552  NA 135 136 134*  K 4.1 4.1 3.9  CL 104 104 103  CO2 25 23 25   GLUCOSE 153* 147* 116*  BUN 64* 61* 62*  CREATININE 4.34* 4.38* 4.78*  CALCIUM 8.4* 8.3* 8.0*  PHOS  --  5.0* 5.0*   CBC Recent Labs  Lab 01/10/21 1017 01/12/21 1202 01/13/21 0140 01/14/21 1552  WBC 6.6 7.4 8.5 11.6*  NEUTROABS 5.2 5.7  --   --   HGB 8.1* 7.0* 8.7* 8.7*  HCT 25.8* 22.3* 27.0* 27.4*  MCV 86.9 87.8 87.4 87.3  PLT 219 178 201 184      Medications:     amLODipine  10 mg Oral Daily   aspirin  325 mg Oral Daily   Chlorhexidine Gluconate Cloth  6 each Topical Q0600    cholecalciferol  1,000 Units Oral Daily   feeding supplement (GLUCERNA SHAKE)  237 mL Oral BID BM   fluticasone  1 spray Each Nare Daily   heparin  5,000 Units Subcutaneous Q8H   hydrALAZINE  100 mg Oral BID    HYDROmorphone (DILAUDID) injection  0.5 mg Intravenous Once   insulin aspart  0-9 Units Subcutaneous TID WC   insulin aspart protamine- aspart  13 Units Subcutaneous BID WC   metoprolol succinate  25 mg Oral QPM   nystatin   Topical TID   pravastatin  80 mg Oral QPM   sevelamer carbonate  800 mg Oral BID WC   torsemide  50 mg Oral BID   vitamin B-12  1,000 mcg Oral Daily   vitamin E  800 Units Oral Daily   zinc sulfate  220 mg Oral Daily     Eda Magnussen A Olanda Downie  01/15/2021, 11:38 AM

## 2021-01-16 DIAGNOSIS — I5032 Chronic diastolic (congestive) heart failure: Secondary | ICD-10-CM | POA: Diagnosis not present

## 2021-01-16 DIAGNOSIS — E1121 Type 2 diabetes mellitus with diabetic nephropathy: Secondary | ICD-10-CM | POA: Diagnosis not present

## 2021-01-16 DIAGNOSIS — E1122 Type 2 diabetes mellitus with diabetic chronic kidney disease: Secondary | ICD-10-CM | POA: Diagnosis not present

## 2021-01-16 DIAGNOSIS — D649 Anemia, unspecified: Secondary | ICD-10-CM | POA: Diagnosis not present

## 2021-01-16 LAB — RENAL FUNCTION PANEL
Albumin: 2.3 g/dL — ABNORMAL LOW (ref 3.5–5.0)
Anion gap: 7 (ref 5–15)
BUN: 52 mg/dL — ABNORMAL HIGH (ref 8–23)
CO2: 27 mmol/L (ref 22–32)
Calcium: 8.1 mg/dL — ABNORMAL LOW (ref 8.9–10.3)
Chloride: 100 mmol/L (ref 98–111)
Creatinine, Ser: 3.74 mg/dL — ABNORMAL HIGH (ref 0.61–1.24)
GFR, Estimated: 17 mL/min — ABNORMAL LOW (ref >60)
Glucose, Bld: 99 mg/dL (ref 70–99)
Phosphorus: 4.5 mg/dL (ref 2.5–4.6)
Potassium: 3.8 mmol/L (ref 3.5–5.1)
Sodium: 134 mmol/L — ABNORMAL LOW (ref 135–145)

## 2021-01-16 LAB — BLOOD GAS, ARTERIAL
Acid-Base Excess: 1.1 mmol/L (ref 0.0–2.0)
Bicarbonate: 25 mmol/L (ref 20.0–28.0)
FIO2: 36
O2 Saturation: 96.2 %
Patient temperature: 37
pCO2 arterial: 54.6 mmHg — ABNORMAL HIGH (ref 32.0–48.0)
pH, Arterial: 7.31 — ABNORMAL LOW (ref 7.350–7.450)
pO2, Arterial: 85 mmHg (ref 83.0–108.0)

## 2021-01-16 LAB — GLUCOSE, CAPILLARY
Glucose-Capillary: 126 mg/dL — ABNORMAL HIGH (ref 70–99)
Glucose-Capillary: 161 mg/dL — ABNORMAL HIGH (ref 70–99)
Glucose-Capillary: 124 mg/dL — ABNORMAL HIGH (ref 70–99)
Glucose-Capillary: 127 mg/dL — ABNORMAL HIGH (ref 70–99)

## 2021-01-16 LAB — HEPATITIS B SURFACE ANTIBODY, QUANTITATIVE: Hep B S AB Quant (Post): <3.1 m[IU]/mL — ABNORMAL LOW (ref >9.9)

## 2021-01-16 LAB — HEPATITIS B CORE ANTIBODY, TOTAL: Hep B Core Total Ab: NONREACTIVE

## 2021-01-16 LAB — HEPATITIS B SURFACE ANTIGEN: Hepatitis B Surface Ag: NONREACTIVE

## 2021-01-16 MED ORDER — NALOXONE HCL 0.4 MG/ML IJ SOLN
0.2000 mg | Freq: Once | INTRAMUSCULAR | Status: AC
  Administered 2021-01-16: 0.2 mg via INTRAVENOUS
  Filled 2021-01-16: qty 1

## 2021-01-16 MED ORDER — OXYCODONE HCL 5 MG PO TABS
5.0000 mg | ORAL_TABLET | Freq: Four times a day (QID) | ORAL | Status: DC | PRN
  Administered 2021-01-18 – 2021-01-26 (×9): 5 mg via ORAL
  Filled 2021-01-16 (×10): qty 1

## 2021-01-16 MED ORDER — INSULIN ASPART PROT & ASPART (70-30 MIX) 100 UNIT/ML ~~LOC~~ SUSP
10.0000 [IU] | Freq: Two times a day (BID) | SUBCUTANEOUS | Status: DC
  Administered 2021-01-17 – 2021-01-20 (×5): 10 [IU] via SUBCUTANEOUS

## 2021-01-16 MED ORDER — METOPROLOL SUCCINATE ER 25 MG PO TB24
25.0000 mg | ORAL_TABLET | Freq: Two times a day (BID) | ORAL | Status: DC
  Administered 2021-01-17 – 2021-01-27 (×21): 25 mg via ORAL
  Filled 2021-01-16 (×21): qty 1

## 2021-01-16 MED ORDER — CHLORHEXIDINE GLUCONATE CLOTH 2 % EX PADS
6.0000 | MEDICATED_PAD | Freq: Every day | CUTANEOUS | Status: DC
  Administered 2021-01-16 – 2021-01-18 (×3): 6 via TOPICAL

## 2021-01-16 MED ORDER — HYDRALAZINE HCL 25 MG PO TABS
50.0000 mg | ORAL_TABLET | Freq: Two times a day (BID) | ORAL | Status: DC
  Administered 2021-01-16 – 2021-01-17 (×3): 50 mg via ORAL
  Filled 2021-01-16 (×3): qty 2

## 2021-01-16 NOTE — Progress Notes (Signed)
PROGRESS NOTE    Danny [name]  LKG:[id number] DOB: 1955/08/11 DOA: 01/12/2021 PCP: Celene Squibb, MD   Chief Complaint  Patient presents with   Weakness    Brief admission narrative:  As per H&P written by Dr. Marily Memos on 01/12/2021 Danny [name] is a 65 y.o. male with medical history significant of Anemia of Chronic disease, CKD stage 5, DM, Chronic LE ulcers from venous stasis dermatitis and DM, HTN, Urinary retention. Pt states he has been an overall physical decline for several days w/o acute complaint. On day of admission pt unable to ambulate from bed and care for self. Profound generalized weakness. Denies CP, SOB, palpitations, ABD pain, dysuria, Frequency, rash, n/v, HA, fever, Cough, hematochezia, melena. Still on Augmentin for Bronchitis that has essentially resolved per pt. Started on 01/04/21. Leg wounds (chronic) were wrapped by home health aid on 01/10/21 per pt. No change in medication recenlyt and endorses compliance.    ED Course: Objective findings below. Started on 2 units PRBC infusion   Assessment & Plan: 1-Symptomatic anemia -And with underlying history of a stage V renal failure and anemia of chronic kidney disease. -Hemoglobin down to 7.0 causing symptomatic anemia. -2 units PRBCs have been given; posttransfusion level up to 8.7. -Hemoglobin 7.7 currently. -Will continue to follow hemoglobin trend intermittently. -Transfusion threshold is for hemoglobin less than 7. -IV iron and Epogen therapy as per renal function discretion. -No overt bleeding appreciated.  2-Diabetes mellitus ([place]) -will f continue to ollow CBG's and further adjust hypoglycemic regimen accordingly. -continue SSI  3-Chronic diastolic CHF (congestive heart failure) (HCC) -Low-sodium diet encouraged. -Patient in need of dialysis initiation -Volume to be managed with dialysis treatment. -Continue to follow daily weights and strict I's and O's.  4-stage V renal failure -Patient was due for  temporary catheter placement and dialysis initiation as an outpatient. -General surgery has placed dialysis catheter 7/22; patient received with incidence failures hemodialysis treatment initiation on 01/15/2021. -Continue to follow renal diet, strict I's and O's and daily weight. -Continue to follow nephrology service recommendations for next dialysis management  5-secondary hyperparathyroidism -Will continue the use of Renvela -Will follow nephrology further recommendations -Follow electrolytes stability.    6-history of bronchitis/community-acquired pneumonia -Continue as needed bronchodilators -Patient has completed oral antibiotic and is currently afebrile. -Continue as needed antitussive/vasculitic agents and the use of flutter valve. -Will continue to wean off oxygen supplementation as tolerated.  7-essential hypertension -BP is soft; will adjust antihypertensive regimen -HD treatment helping volume and BP -Continue heart healthy diet.  8-hyperlipidemia -Continue statins. -Heart healthy diet encouraged.   DVT prophylaxis: Heparin Code Status: Full code Family Communication: No family at bedside. Disposition:   Status is: Inpatient  Remains inpatient appropriate because:IV treatments appropriate due to intensity of illness or inability to take PO  Dispo: The patient is from: Home              Anticipated d/c is to: SNF              Patient currently is not medically stable to d/c.   Difficult to place patient Yes    Consultants:  Nephrology service General surgery  Procedures:  See below for x-ray reports  Antimicrobials:  Augmentin last dose 01/13/2021.   Subjective:  Weak and deconditioned; no overnight event.  Expressed feeling better overall.  No chest pain, no palpitations, no nausea, no vomiting.  Objective: Vitals:   01/15/21 1726 01/15/21 1818 01/15/21 1930 01/16/21 0447  BP: (!) 108/50 121/73 131/78  119/71  Pulse: (!) 112 (!) 109 (!) 108 (!) 104   Resp: 20 20 20 18   Temp: 98 F (36.7 C) 98.4 F (36.9 C) 98.9 F (37.2 C) 97.8 F (36.6 C)  TempSrc: Oral Oral Oral Oral  SpO2: 97% 98% 100% 90%  Weight:      Height:        Intake/Output Summary (Last 24 hours) at 01/16/2021 0818 Last data filed at 01/16/2021 0425 Gross per 24 hour  Intake 874.5 ml  Output 1105 ml  Net -230.5 ml   Filed Weights   01/12/21 1033 01/15/21 1415 01/15/21 1700  Weight: 129.3 kg (!) 137.2 kg (!) 137.3 kg    Examination: General exam: Alert, awake, oriented x 3; no chest pain, no nausea, no vomiting.  Patient is afebrile.  No overnight events.  Well tolerated hemodialysis treatment initiation (01/15/2021). Respiratory system: Decreased breath sounds at the bases; positive scattered rhonchi.  No wheezing. Cardiovascular system: Rate controlled, no rubs, no gallops, no murmurs. Gastrointestinal system: Abdomen is nondistended, soft and nontender. No organomegaly or masses felt. Normal bowel sounds heard. Central nervous system: Alert and oriented. No focal neurological deficits. Extremities: No cyanosis or clubbing; 1+ edema appreciated bilaterally; Unna boots in place. Skin: No petechiae. Psychiatry: Judgement and insight appear normal. Mood & affect appropriate.    Data Reviewed: I have personally reviewed following labs and imaging studies  CBC: Recent Labs  Lab 01/10/21 1017 01/12/21 1202 01/13/21 0140 01/14/21 1552 01/15/21 1512  WBC 6.6 7.4 8.5 11.6* 9.3  NEUTROABS 5.2 5.7  --   --   --   HGB 8.1* 7.0* 8.7* 8.7* 7.7*  HCT 25.8* 22.3* 27.0* 27.4* 25.2*  MCV 86.9 87.8 87.4 87.3 89.0  PLT 219 178 201 184 175    Basic Metabolic Panel: Recent Labs  Lab 01/12/21 1202 01/13/21 0140 01/14/21 1552 01/15/21 1512 01/16/21 0506  NA 135 136 134* 136 134*  K 4.1 4.1 3.9 4.3 3.8  CL 104 104 103 102 100  CO2 25 23 25 26 27   GLUCOSE 153* 147* 116* 103* 99  BUN 64* 61* 62* 67* 52*  CREATININE 4.34* 4.38* 4.78* 4.94* 3.74*  CALCIUM 8.4*  8.3* 8.0* 8.1* 8.1*  MG  --  2.7*  --   --   --   PHOS  --  5.0* 5.0* 5.8* 4.5    GFR: Estimated Creatinine Clearance: 27.9 mL/min (A) (by C-G formula based on SCr of 3.74 mg/dL (H)).  Liver Function Tests: Recent Labs  Lab 01/12/21 1202 01/14/21 1552 01/15/21 1512 01/16/21 0506  AST 18  --   --   --   ALT 16  --   --   --   ALKPHOS 120  --   --   --   BILITOT 1.0  --   --   --   PROT 7.2  --   --   --   ALBUMIN 2.8* 2.5* 2.3* 2.3*    CBG: Recent Labs  Lab 01/15/21 0730 01/15/21 1112 01/15/21 1714 01/15/21 2035 01/16/21 0717  GLUCAP 282* 156* 136* 146* 126*     Recent Results (from the past 240 hour(s))  Resp Panel by RT-PCR (Flu A&B, Covid) Nasopharyngeal Swab     Status: None   Collection Time: 01/12/21 12:11 PM   Specimen: Nasopharyngeal Swab; Nasopharyngeal(NP) swabs in vial transport medium  Result Value Ref Range Status   SARS Coronavirus 2 by RT PCR NEGATIVE NEGATIVE Final    Comment: (NOTE) SARS-CoV-2 target  nucleic acids are NOT DETECTED.  The SARS-CoV-2 RNA is generally detectable in upper respiratory specimens during the acute phase of infection. The lowest concentration of SARS-CoV-2 viral copies this assay can detect is 138 copies/mL. A negative result does not preclude SARS-Cov-2 infection and should not be used as the sole basis for treatment or other patient management decisions. A negative result may occur with  improper specimen collection/handling, submission of specimen other than nasopharyngeal swab, presence of viral mutation(s) within the areas targeted by this assay, and inadequate number of viral copies(<138 copies/mL). A negative result must be combined with clinical observations, patient history, and epidemiological information. The expected result is Negative.  Fact Sheet for Patients:  EntrepreneurPulse.com.au  Fact Sheet for Healthcare Providers:  IncredibleEmployment.be  This test is no t  yet approved or cleared by the [place] FDA and  has been authorized for detection and/or diagnosis of SARS-CoV-2 by FDA under an Emergency Use Authorization (EUA). This EUA will remain  in effect (meaning this test can be used) for the duration of the COVID-19 declaration under Section 564(b)(1) of the Act, 21 U.S.C.section 360bbb-3(b)(1), unless the authorization is terminated  or revoked sooner.       Influenza A by PCR NEGATIVE NEGATIVE Final   Influenza B by PCR NEGATIVE NEGATIVE Final    Comment: (NOTE) The Xpert Xpress SARS-CoV-2/FLU/RSV plus assay is intended as an aid in the diagnosis of influenza from Nasopharyngeal swab specimens and should not be used as a sole basis for treatment. Nasal washings and aspirates are unacceptable for Xpert Xpress SARS-CoV-2/FLU/RSV testing.  Fact Sheet for Patients: EntrepreneurPulse.com.au  Fact Sheet for Healthcare Providers: IncredibleEmployment.be  This test is not yet approved or cleared by the [place] FDA and has been authorized for detection and/or diagnosis of SARS-CoV-2 by FDA under an Emergency Use Authorization (EUA). This EUA will remain in effect (meaning this test can be used) for the duration of the COVID-19 declaration under Section 564(b)(1) of the Act, 21 U.S.C. section 360bbb-3(b)(1), unless the authorization is terminated or revoked.  Performed at [facility], [address] [place], [place] [address]      Radiology Studies: [facility] Chest [facility] 1 View  Result Date: 01/14/2021 CLINICAL DATA:  Vascular dialysis catheter in place. EXAM: PORTABLE CHEST 1 VIEW COMPARISON:  Radiograph 01/12/2021. FINDINGS: New right internal jugular central venous catheter tip overlies the lower SVC. No pneumothorax. Stable cardiomegaly. Slight worsening right basilar aeration and blunting of the costophrenic angle, suspicious for increasing pleural effusion and atelectasis. Vascular congestion.  IMPRESSION: 1. New right internal jugular central venous catheter with tip overlying the lower SVC. No pneumothorax. 2. Slight worsening right basilar aeration and blunting of the costophrenic angle, suspicious for increasing pleural effusion and atelectasis. Unchanged cardiomegaly. Electronically Signed   By: Keith Rake M.D.   On: 01/14/2021 15:06   DG C-Arm 1-60 Min-No Report  Result Date: 01/14/2021 Fluoroscopy was utilized by the requesting physician.  No radiographic interpretation.     Scheduled Meds:  amLODipine  10 mg Oral Daily   aspirin  325 mg Oral Daily   Chlorhexidine Gluconate Cloth  6 each Topical Q0600   cholecalciferol  1,000 Units Oral Daily   [START ON 01/17/2021] darbepoetin (ARANESP) injection - DIALYSIS  100 mcg Intravenous Q Mon-HD   feeding supplement (GLUCERNA SHAKE)  237 mL Oral BID BM   fluticasone  1 spray Each Nare Daily   heparin  5,000 Units Subcutaneous Q8H   hydrALAZINE  100 mg Oral  BID    HYDROmorphone (DILAUDID) injection  0.5 mg Intravenous Once   insulin aspart  0-9 Units Subcutaneous TID WC   insulin aspart protamine- aspart  13 Units Subcutaneous BID WC   metoprolol succinate  25 mg Oral QPM   nystatin   Topical TID   pravastatin  80 mg Oral QPM   sevelamer carbonate  800 mg Oral BID WC   torsemide  50 mg Oral BID   vitamin B-12  1,000 mcg Oral Daily   vitamin E  800 Units Oral Daily   zinc sulfate  220 mg Oral Daily   Continuous Infusions:  sodium chloride     sodium chloride     ferric gluconate (FERRLECIT) IVPB Stopped (01/15/21 1705)   methocarbamol (ROBAXIN) IV       LOS: 4 days    Time spent: 30 minutes   Barton Dubois, MD [facility]   To contact the attending provider between 7A-7P or the covering provider during after hours 7P-7A, please log into the web site [email or web address] and access using universal [place] password for that web site. If you do not have the password, please call the hospital  operator.  01/16/2021, 8:18 AM

## 2021-01-16 NOTE — Consult Note (Signed)
WOC Nurse Consult Note: Reason for Consult:Chronic, nonhealing full thickness right lateral malleolar wound Wound type:Venous insufficiency Pressure Injury POA: N/A Measurement:5cm x 4cm x 0.5cm Per Bedside RN. Wound FEX:MDYJW red Drainage (amount, consistency, odor) serous to serosanguinous (previous addressing was adherent to wound bed) Periwound: dried serum Dressing procedure/placement/frequency: Guidance for Bedside RN for daily wound care is provided via the Orders.  Daily cleanse with NS, pat dry. Cover wound with silver hydrofiber (Aquacel Ag+ Advantage), top with dry dressing, ABD and secure with Kerlix/paper tape.  Place foot into [employer].  May moisten with NS, as needed for removal.  Pressure injury measures are in place, turning and repositioning, sacral foam.  [place] nursing team will not follow, but will remain available to this patient, the nursing and medical teams.  Please re-consult if needed. Thanks, Maudie Flakes, MSN, RN, [place], Arther Abbott  Pager# [phone]

## 2021-01-16 NOTE — Progress Notes (Signed)
In to assist primary nurse with turn and reposition of pt. Pt noted to have large amount dried drainage on bed sheet under his right foot. Sock removed to reveal dried 4x4 guaze adhered to wound on right lateral ankle. Gauze removed and open wound measured 5 cm x 4 cm x 0.5 cm. Wound base pink with white center and layers of dried exudate on skin surrounding wound. Wound and surrounding skin cleaned with soap and water. Wet to dry 4x4 dressing applied, covered with abd pad and secured with kerlix. MD notified to request wound consult.

## 2021-01-16 NOTE — Progress Notes (Signed)
Primary nurse called this nurse to assess patient. Primary nurse stated patient was lethargic not wanting to stay awake or eat. Vital signs stable. Primary nurse paged MD. MD placed order for narcan 0.2mg  IV one time dose, ABG ordered also. This nurse gave one time dose of narcan 0.2mg  IV. MD updated. Patient is becoming more alert at this time. Vital signs stable. Will continue to monitor.

## 2021-01-17 ENCOUNTER — Encounter (HOSPITAL_COMMUNITY): Payer: Self-pay | Admitting: General Surgery

## 2021-01-17 DIAGNOSIS — D649 Anemia, unspecified: Secondary | ICD-10-CM | POA: Diagnosis not present

## 2021-01-17 DIAGNOSIS — E1122 Type 2 diabetes mellitus with diabetic chronic kidney disease: Secondary | ICD-10-CM | POA: Diagnosis not present

## 2021-01-17 DIAGNOSIS — I5032 Chronic diastolic (congestive) heart failure: Secondary | ICD-10-CM | POA: Diagnosis not present

## 2021-01-17 DIAGNOSIS — E1121 Type 2 diabetes mellitus with diabetic nephropathy: Secondary | ICD-10-CM | POA: Diagnosis not present

## 2021-01-17 LAB — RENAL FUNCTION PANEL
Albumin: 2.3 g/dL — ABNORMAL LOW (ref 3.5–5.0)
Anion gap: 8 (ref 5–15)
BUN: 62 mg/dL — ABNORMAL HIGH (ref 8–23)
CO2: 25 mmol/L (ref 22–32)
Calcium: 7.9 mg/dL — ABNORMAL LOW (ref 8.9–10.3)
Chloride: 100 mmol/L (ref 98–111)
Creatinine, Ser: 4.47 mg/dL — ABNORMAL HIGH (ref 0.61–1.24)
GFR, Estimated: 14 mL/min — ABNORMAL LOW (ref >60)
Glucose, Bld: 111 mg/dL — ABNORMAL HIGH (ref 70–99)
Phosphorus: 5.6 mg/dL — ABNORMAL HIGH (ref 2.5–4.6)
Potassium: 3.8 mmol/L (ref 3.5–5.1)
Sodium: 133 mmol/L — ABNORMAL LOW (ref 135–145)

## 2021-01-17 LAB — CBC
HCT: 25 % — ABNORMAL LOW (ref 39.0–52.0)
Hemoglobin: 7.7 g/dL — ABNORMAL LOW (ref 13.0–17.0)
MCH: 27.4 pg (ref 26.0–34.0)
MCHC: 30.8 g/dL (ref 30.0–36.0)
MCV: 89 fL (ref 80.0–100.0)
Platelets: 205 10*3/uL (ref 150–400)
RBC: 2.81 MIL/uL — ABNORMAL LOW (ref 4.22–5.81)
RDW: 18.5 % — ABNORMAL HIGH (ref 11.5–15.5)
WBC: 7 10*3/uL (ref 4.0–10.5)
nRBC: 0 % (ref 0.0–0.2)

## 2021-01-17 LAB — GLUCOSE, CAPILLARY
Glucose-Capillary: 156 mg/dL — ABNORMAL HIGH (ref 70–99)
Glucose-Capillary: 123 mg/dL — ABNORMAL HIGH (ref 70–99)
Glucose-Capillary: 119 mg/dL — ABNORMAL HIGH (ref 70–99)

## 2021-01-17 MED ORDER — HEPARIN SODIUM (PORCINE) 1000 UNIT/ML IJ SOLN
INTRAMUSCULAR | Status: AC
  Administered 2021-01-17: 3800 [IU] via INTRAVENOUS_CENTRAL
  Filled 2021-01-17: qty 4

## 2021-01-17 MED ORDER — ALBUMIN HUMAN 25 % IV SOLN: 25.0000 g | Freq: Once | INTRAVENOUS | Status: AC

## 2021-01-17 MED ORDER — ALBUMIN HUMAN 25 % IV SOLN
INTRAVENOUS | Status: AC
  Administered 2021-01-17: 25 g via INTRAVENOUS
  Filled 2021-01-17: qty 100

## 2021-01-17 MED ORDER — DARBEPOETIN ALFA 100 MCG/0.5ML IJ SOSY
PREFILLED_SYRINGE | INTRAMUSCULAR | Status: AC
  Filled 2021-01-17: qty 0.5

## 2021-01-17 NOTE — Procedures (Signed)
   HEMODIALYSIS TREATMENT NOTE:  3 hour heparin-free HD completed via RIJ TDC.  UF was limited by hypotension despite cooling of dialysate and administration of Albumin.  Net UF 1.6 liters.  All blood was returned.  No changes from pre-HD assessment.  Rockwell Alexandria, RN

## 2021-01-17 NOTE — TOC Progression Note (Signed)
Transition of Care [facility]) - Progression Note    Patient Details  Name: Danny [name] MRN: [id number] Date of Birth: 12/09/1955  Transition of Care [facility]) CM/SW Contact  Natasha Bence, LCSW Phone Number: 01/17/2021, 12:03 PM  Clinical Narrative:    CSW faxed nephology order and hep B panel to [place]. TOC to follow.    Expected Discharge Plan: Skilled Nursing Facility Barriers to Discharge: Continued Medical Work up  Expected Discharge Plan and Services Expected Discharge Plan: [place]       Living arrangements for the past 2 months: Apartment                                       Social Determinants of Health (SDOH) Interventions    Readmission Risk Interventions Readmission Risk Prevention Plan 01/13/2021 11/05/2020 03/24/2020  Transportation Screening Complete - Complete  PCP or Specialist Appt within 3-5 Days - Complete Not Complete  Not Complete comments - - -  HRI or [place] - - Complete  Social Work Consult for [place] Planning/Counseling - - Complete  Palliative Care Screening - - Not Applicable  Medication Review Press photographer) Complete - Complete  PCP or Specialist appointment within 3-5 days of discharge - - -  [place] or Home Care Consult Complete - -  SW Recovery Care/Counseling Consult Complete - -  Palliative Care Screening Not Complete - -  Skilled Nursing Facility Complete - -  Some recent data might be hidden

## 2021-01-17 NOTE — Progress Notes (Signed)
[facility] Progress Note   Assessment/ Plan:    CKD V--> ESRD: will need to start dialysis based on OP plan and uremic symptoms.  [facility] 7/22 and HD #1 7/23, plan for tx #2 Monday (today).  Will need CLIP- I am told he will be going to [facility]-  has MWF chair- will plan for HD #3 on Wednesday Symptomatic anemia: resumption of ESA- will do aranesp-  also giving iron  HTN: reasonable control- norvasc 10/hydralazine 100 and topril-  may be able to wean BP meds as volume gets under better control -  have stopped norvasc and decreased hydral so will be able to pull volume with HD DM II: per primary Bone/ mineral: Phos 5- renvela being given-  no recent PTH-  will check one - pending  Venous stasis ulcer: has unna boots Dispo-  VERY deconditioned-  I guess plan is for SNF    Subjective:    Had first HD Sat-  unable to remove volume due to low BPs-  req narcan overnight for somnolence-  very deconditioned and also has foot wound   Objective:   BP 137/65 (BP Location: Left Arm)   Pulse (!) 110   Temp 97.9 F (36.6 C) (Oral)   Resp 18   Ht 6\' 1"  (1.854 m)   Wt (!) 137.3 kg   SpO2 99%   BMI 39.94 kg/m   Physical Exam: Gen- pale, difficult to get words out, very weak Chest-  dec BS at bases-  saturated dressing from [facility] Abd-  obese, soft, non tender Ext-  pitting edema   Labs: BMET Recent Labs  Lab 01/12/21 1202 01/13/21 0140 01/14/21 1552 01/15/21 1512 01/16/21 0506  NA 135 136 134* 136 134*  K 4.1 4.1 3.9 4.3 3.8  CL 104 104 103 102 100  CO2 25 23 25 26 27   GLUCOSE 153* 147* 116* 103* 99  BUN 64* 61* 62* 67* 52*  CREATININE 4.34* 4.38* 4.78* 4.94* 3.74*  CALCIUM 8.4* 8.3* 8.0* 8.1* 8.1*  PHOS  --  5.0* 5.0* 5.8* 4.5   CBC Recent Labs  Lab 01/10/21 1017 01/12/21 1202 01/13/21 0140 01/14/21 1552 01/15/21 1512  WBC 6.6 7.4 8.5 11.6* 9.3  NEUTROABS 5.2 5.7  --   --   --   HGB 8.1* 7.0* 8.7* 8.7* 7.7*  HCT 25.8* 22.3* 27.0* 27.4* 25.2*   MCV 86.9 87.8 87.4 87.3 89.0  PLT 219 178 201 184 176      Medications:     aspirin  325 mg Oral Daily   Chlorhexidine Gluconate Cloth  6 each Topical Q0600   Chlorhexidine Gluconate Cloth  6 each Topical Q0600   cholecalciferol  1,000 Units Oral Daily   darbepoetin (ARANESP) injection - DIALYSIS  100 mcg Intravenous Q Mon-HD   feeding supplement (GLUCERNA SHAKE)  237 mL Oral BID BM   fluticasone  1 spray Each Nare Daily   heparin  5,000 Units Subcutaneous Q8H   hydrALAZINE  50 mg Oral BID   insulin aspart  0-9 Units Subcutaneous TID WC   insulin aspart protamine- aspart  10 Units Subcutaneous BID WC   metoprolol succinate  25 mg Oral Q12H   nystatin   Topical TID   pravastatin  80 mg Oral QPM   sevelamer carbonate  800 mg Oral BID WC   vitamin B-12  1,000 mcg Oral Daily   vitamin E  800 Units Oral Daily   zinc sulfate  220 mg Oral Daily  Louis Meckel  01/17/2021, 8:55 AM

## 2021-01-17 NOTE — Progress Notes (Signed)
PROGRESS NOTE    Danny [name]  YPP:[id number] DOB: 11/18/1955 DOA: 01/12/2021 PCP: Celene Squibb, MD   Chief Complaint  Patient presents with   Weakness    Brief admission narrative:  As per H&P written by Dr. Marily Memos on 01/12/2021 Danny [name] is a 65 y.o. male with medical history significant of Anemia of Chronic disease, CKD stage 5, DM, Chronic LE ulcers from venous stasis dermatitis and DM, HTN, Urinary retention. Pt states he has been an overall physical decline for several days w/o acute complaint. On day of admission pt unable to ambulate from bed and care for self. Profound generalized weakness. Denies CP, SOB, palpitations, ABD pain, dysuria, Frequency, rash, n/v, HA, fever, Cough, hematochezia, melena. Still on Augmentin for Bronchitis that has essentially resolved per pt. Started on 01/04/21. Leg wounds (chronic) were wrapped by home health aid on 01/10/21 per pt. No change in medication recenlyt and endorses compliance.    ED Course: Objective findings below. Started on 2 units PRBC infusion   Assessment & Plan: 1-Symptomatic anemia -And with underlying history of a stage V renal failure and anemia of chronic kidney disease. -Hemoglobin down to 7.0 causing symptomatic anemia. -2 units PRBCs have been given; posttransfusion level up to 8.7. -Hemoglobin 7.7 currently. -Will continue to follow hemoglobin trend intermittently. -Transfusion threshold is for hemoglobin less than 7. -IV iron and Epogen therapy as per renal function discretion. -No overt bleeding appreciated.  2-Diabetes mellitus ([place]) -will f continue to follow CBG's and further adjust hypoglycemic regimen accordingly. -continue SSI  3-Chronic diastolic CHF (congestive heart failure) (HCC) -Low-sodium diet encouraged. -Patient in need of dialysis initiation -Volume to be managed with dialysis treatment. -Continue to follow daily weights and strict I's and O's.  4-stage V renal failure -Patient was due for  temporary catheter placement and dialysis initiation as an outpatient. -General surgery has placed dialysis catheter 7/22; patient received with incidence failures hemodialysis treatment initiation on 01/15/2021. -Planning for second dialysis treatment later today 01/17/2021. -Continue to follow renal diet, strict I's and O's and daily weight. -Continue to follow nephrology service recommendations for next dialysis management  5-secondary hyperparathyroidism -Will continue the use of Renvela -Will follow nephrology further recommendations -Follow electrolytes stability.    6-history of bronchitis/community-acquired pneumonia -Continue as needed bronchodilators -Patient has completed oral antibiotic and is currently afebrile. -Continue as needed antitussive/vasculitic agents and the use of flutter valve. -Will continue to wean off oxygen supplementation as tolerated.  7-essential hypertension -BP is soft; will adjust antihypertensive regimen -HD treatment helping volume and BP -Continue heart healthy diet.  8-hyperlipidemia -Continue statins. -Heart healthy diet encouraged.   DVT prophylaxis: Heparin Code Status: Full code Family Communication: No family at bedside. Disposition:   Status is: Inpatient  Remains inpatient appropriate because:IV treatments appropriate due to intensity of illness or inability to take PO  Dispo: The patient is from: Home              Anticipated d/c is to: SNF              Patient currently is not medically stable to d/c.   Difficult to place patient Yes    Consultants:  Nephrology service General surgery  Procedures:  See below for x-ray reports  Antimicrobials:  Augmentin last dose 01/13/2021.   Subjective: Significantly weak and deconditioned; increase sleepiness.  No chest pain, no nausea, no vomiting.  Objective: Vitals:   01/17/21 1600 01/17/21 1615 01/17/21 1630 01/17/21 1645  BP: 116/68 133/69 102/67 112/71  Pulse: (!) 110  (!) 114 (!) 110 (!) 110  Resp:      Temp:      TempSrc:      SpO2:      Weight:      Height:        Intake/Output Summary (Last 24 hours) at 01/17/2021 1655 Last data filed at 01/17/2021 1500 Gross per 24 hour  Intake 792.42 ml  Output 1100 ml  Net -307.58 ml   Filed Weights   01/15/21 1415 01/15/21 1700 01/17/21 1350  Weight: (!) 137.2 kg (!) 137.3 kg (!) 136.4 kg    Examination: General exam: Alert, awake, oriented x 3; reports to be very sleepy and sometimes having difficulty keeping his self awake. Respiratory system: Positive scattered rhonchi; no wheezing, no frank crackles.  Decreased breath sounds at the bases. Cardiovascular system: Sinus tachycardia, no rubs, no gallops, unable to properly assess JVD with body habitus. Gastrointestinal system: Abdomen is nondistended, soft and nontender. No organomegaly or masses felt. Normal bowel sounds heard. Central nervous system: Alert and oriented. No focal neurological deficits. Extremities: No cyanosis or clubbing.  Trace to 1+ edema bilaterally appreciated. Skin: Chronic excessive dermatitis and nonhealing right lateral malleolar wound secondary to venous insufficiency; serosanguineous drainage appreciated to adhere to wound bed; no signs of superimposed infection. Psychiatry: Judgement and insight appear normal. Mood & affect appropriate.    Data Reviewed: I have personally reviewed following labs and imaging studies  CBC: Recent Labs  Lab 01/12/21 1202 01/13/21 0140 01/14/21 1552 01/15/21 1512 01/17/21 1409  WBC 7.4 8.5 11.6* 9.3 7.0  NEUTROABS 5.7  --   --   --   --   HGB 7.0* 8.7* 8.7* 7.7* 7.7*  HCT 22.3* 27.0* 27.4* 25.2* 25.0*  MCV 87.8 87.4 87.3 89.0 89.0  PLT 178 201 184 176 841    Basic Metabolic Panel: Recent Labs  Lab 01/13/21 0140 01/14/21 1552 01/15/21 1512 01/16/21 0506 01/17/21 1409  NA 136 134* 136 134* 133*  K 4.1 3.9 4.3 3.8 3.8  CL 104 103 102 100 100  CO2 23 25 26 27 25   GLUCOSE 147*  116* 103* 99 111*  BUN 61* 62* 67* 52* 62*  CREATININE 4.38* 4.78* 4.94* 3.74* 4.47*  CALCIUM 8.3* 8.0* 8.1* 8.1* 7.9*  MG 2.7*  --   --   --   --   PHOS 5.0* 5.0* 5.8* 4.5 5.6*    GFR: Estimated Creatinine Clearance: 23.2 mL/min (A) (by C-G formula based on SCr of 4.47 mg/dL (H)).  Liver Function Tests: Recent Labs  Lab 01/12/21 1202 01/14/21 1552 01/15/21 1512 01/16/21 0506 01/17/21 1409  AST 18  --   --   --   --   ALT 16  --   --   --   --   ALKPHOS 120  --   --   --   --   BILITOT 1.0  --   --   --   --   PROT 7.2  --   --   --   --   ALBUMIN 2.8* 2.5* 2.3* 2.3* 2.3*    CBG: Recent Labs  Lab 01/16/21 1104 01/16/21 1603 01/16/21 2059 01/17/21 0712 01/17/21 1108  GLUCAP 161* 124* 127* 156* 123*     Recent Results (from the past 240 hour(s))  Resp Panel by RT-PCR (Flu A&B, Covid) Nasopharyngeal Swab     Status: None   Collection Time: 01/12/21 12:11 PM   Specimen: Nasopharyngeal Swab; Nasopharyngeal(NP) swabs  in vial transport medium  Result Value Ref Range Status   SARS Coronavirus 2 by RT PCR NEGATIVE NEGATIVE Final    Comment: (NOTE) SARS-CoV-2 target nucleic acids are NOT DETECTED.  The SARS-CoV-2 RNA is generally detectable in upper respiratory specimens during the acute phase of infection. The lowest concentration of SARS-CoV-2 viral copies this assay can detect is 138 copies/mL. A negative result does not preclude SARS-Cov-2 infection and should not be used as the sole basis for treatment or other patient management decisions. A negative result may occur with  improper specimen collection/handling, submission of specimen other than nasopharyngeal swab, presence of viral mutation(s) within the areas targeted by this assay, and inadequate number of viral copies(<138 copies/mL). A negative result must be combined with clinical observations, patient history, and epidemiological information. The expected result is Negative.  Fact Sheet for Patients:   EntrepreneurPulse.com.au  Fact Sheet for Healthcare Providers:  IncredibleEmployment.be  This test is no t yet approved or cleared by the [place] FDA and  has been authorized for detection and/or diagnosis of SARS-CoV-2 by FDA under an Emergency Use Authorization (EUA). This EUA will remain  in effect (meaning this test can be used) for the duration of the COVID-19 declaration under Section 564(b)(1) of the Act, 21 U.S.C.section 360bbb-3(b)(1), unless the authorization is terminated  or revoked sooner.       Influenza A by PCR NEGATIVE NEGATIVE Final   Influenza B by PCR NEGATIVE NEGATIVE Final    Comment: (NOTE) The Xpert Xpress SARS-CoV-2/FLU/RSV plus assay is intended as an aid in the diagnosis of influenza from Nasopharyngeal swab specimens and should not be used as a sole basis for treatment. Nasal washings and aspirates are unacceptable for Xpert Xpress SARS-CoV-2/FLU/RSV testing.  Fact Sheet for Patients: EntrepreneurPulse.com.au  Fact Sheet for Healthcare Providers: IncredibleEmployment.be  This test is not yet approved or cleared by the [place] FDA and has been authorized for detection and/or diagnosis of SARS-CoV-2 by FDA under an Emergency Use Authorization (EUA). This EUA will remain in effect (meaning this test can be used) for the duration of the COVID-19 declaration under Section 564(b)(1) of the Act, 21 U.S.C. section 360bbb-3(b)(1), unless the authorization is terminated or revoked.  Performed at [facility], [address] [place], [place] [address]      Radiology Studies: No results found.   Scheduled Meds:  heparin sodium (porcine)       aspirin  325 mg Oral Daily   Chlorhexidine Gluconate Cloth  6 each Topical Q0600   Chlorhexidine Gluconate Cloth  6 each Topical Q0600   cholecalciferol  1,000 Units Oral Daily   Darbepoetin Alfa       darbepoetin (ARANESP)  injection - DIALYSIS  100 mcg Intravenous Q Mon-HD   feeding supplement (GLUCERNA SHAKE)  237 mL Oral BID BM   fluticasone  1 spray Each Nare Daily   heparin  5,000 Units Subcutaneous Q8H   hydrALAZINE  50 mg Oral BID   insulin aspart  0-9 Units Subcutaneous TID WC   insulin aspart protamine- aspart  10 Units Subcutaneous BID WC   metoprolol succinate  25 mg Oral Q12H   nystatin   Topical TID   pravastatin  80 mg Oral QPM   sevelamer carbonate  800 mg Oral BID WC   vitamin B-12  1,000 mcg Oral Daily   vitamin E  800 Units Oral Daily   zinc sulfate  220 mg Oral Daily   Continuous Infusions:  sodium chloride  sodium chloride     ferric gluconate (FERRLECIT) IVPB 250 mg (01/17/21 1101)   methocarbamol (ROBAXIN) IV       LOS: 5 days    Time spent: 30 minutes   Barton Dubois, MD [facility]   To contact the attending provider between 7A-7P or the covering provider during after hours 7P-7A, please log into the web site [email or web address] and access using universal [place] password for that web site. If you do not have the password, please call the hospital operator.  01/17/2021, 4:55 PM

## 2021-01-17 NOTE — Progress Notes (Signed)
   01/17/21 0915  Assess: MEWS Score  BP 134/85  Pulse Rate (!) 119  Resp 20  SpO2 100 %  O2 Device Nasal Cannula  O2 Flow Rate (L/min) 3 L/min  Assess: MEWS Score  MEWS Temp 0  MEWS Systolic 0  MEWS Pulse 2  MEWS RR 0  MEWS LOC 0  MEWS Score 2  MEWS Score Color Yellow  Treat  Pain Scale 0-10  Pain Score 2  Pain Type Acute pain  Pain Location Neck  Pain Orientation Right  Pain Onset Sudden  Pain Intervention(s) Medication (See eMAR)  Multiple Pain Sites No  Notify: Charge Nurse/RN  Name of Charge Nurse/RN Notified Audrea Muscat RN  Date Charge Nurse/RN Notified 01/17/21  Time Charge Nurse/RN Notified 0920  Notify: Provider  Provider Name/Title Dr Dyann Kief  Date Provider Notified 01/17/21  Time Provider Notified [phone]  Notification Type Page (secure chat)  Notification Reason Change in status (HR running in 120s)  Provider response Other (Comment) (take BP meds)  Date of Provider Response 01/17/21  Time of Provider Response 0932  Per MD ok to give Hydralazine and Metoprolol even though patient is scheduled for dialysis today. Charge RN informed of yellow mews. Will continue to monitor patient closely.

## 2021-01-17 NOTE — Addendum Note (Signed)
Addendum  created 01/17/21 0707 by Karna Dupes, CRNA   Charge Capture section accepted

## 2021-01-17 NOTE — Progress Notes (Addendum)
Physical Therapy Treatment Patient Details Name: Danny [name] MRN: [id number] DOB: May 24, 1956 Today's Date: 01/17/2021    History of Present Illness Danny [name] is a 65 y.o. male with medical history significant of Anemia of Chronic disease, CKD stage 5, DM, Chronic LE ulcers from venous stasis dermatitis and DM, HTN, Urinary retention. Pt states he has been an overall physical decline for several days w/o acute complaint. On day of admission pt unable to ambulate from bed and care for self. Profound generalized weakness. Denies CP, SOB, palpitations, ABD pain, dysuria, Frequency, rash, n/v, HA, fever, Cough, hematochezia, melena. Still on Augmentin for Bronchitis that has essentially resolved per pt. Started on 01/04/21. Leg wounds (chronic) were wrapped by home health aid on 01/10/21 per pt. No change in medication recenlyt and endorses compliance.    PT Comments    Patient demonstrates slow labored movement for rolling to side and sitting up from side lying position with HOB raised, initially upon sitting patient required Min assist with support at left shoulder to prevent from falling over to the left, after a few minutes patient able to maintain sitting balance supporting self with BUE.  Patient demonstrates fair/good return for completing BLE ROM/strengthening exercises requiring active assistance to complete hip raises due to weakness.  Patient unable to scoot laterally or attempt sit to stands due to c/o fatigue and weakness and required Mod/max assist to reposition when put back to bed.  Patient will benefit from continued physical therapy in hospital and recommended venue below to increase strength, balance, endurance for safe ADLs and gait.      Follow Up Recommendations  SNF     Equipment Recommendations       Recommendations for Other Services       Precautions / Restrictions Precautions Precautions: Fall Restrictions Weight Bearing Restrictions: No    Mobility  Bed  Mobility Overal bed mobility: Needs Assistance Bed Mobility: Rolling;Sidelying to Sit Rolling: Mod assist Sidelying to sit: HOB elevated;Mod assist       General bed mobility comments: increased time, labored movement, required HOB raised    Transfers                    Ambulation/Gait                 Stairs             Wheelchair Mobility    Modified Rankin (Stroke Patients Only)       Balance Overall balance assessment: Needs assistance Sitting-balance support: Feet supported;Bilateral upper extremity supported Sitting balance-Leahy Scale: Fair Sitting balance - Comments: sitting at EOB                                    Cognition Arousal/Alertness: Awake/alert Behavior During Therapy: WFL for tasks assessed/performed Overall Cognitive Status: Within Functional Limits for tasks assessed                                        Exercises General Exercises - Lower Extremity Long Arc Quad: Seated;AROM;Strengthening;Both;10 reps Hip Flexion/Marching: Seated;AAROM;Strengthening;Both;10 reps Toe Raises: Seated;AROM;Strengthening;Left;10 reps Heel Raises: Seated;AROM;Strengthening;Both;10 reps    General Comments        Pertinent Vitals/Pain Pain Assessment: No/denies pain    Home Living  Prior Function            PT Goals (current goals can now be found in the care plan section) Acute Rehab PT Goals Patient Stated Goal: return home PT Goal Formulation: With patient Time For Goal Achievement: 01/27/21 Potential to Achieve Goals: Good Progress towards PT goals: Progressing toward goals    Frequency    Min 3X/week      PT Plan Current plan remains appropriate    Co-evaluation              AM-PAC PT "6 Clicks" Mobility   Outcome Measure  Help needed turning from your back to your side while in a flat bed without using bedrails?: A Lot Help needed moving from  lying on your back to sitting on the side of a flat bed without using bedrails?: A Lot Help needed moving to and from a bed to a chair (including a wheelchair)?: Total Help needed standing up from a chair using your arms (e.g., wheelchair or bedside chair)?: Total Help needed to walk in hospital room?: Total Help needed climbing 3-5 steps with a railing? : Total 6 Click Score: 8    End of Session Equipment Utilized During Treatment: Oxygen Activity Tolerance: Patient tolerated treatment well;Patient limited by fatigue Patient left: in bed;with call bell/phone within reach Nurse Communication: Mobility status PT Visit Diagnosis: Unsteadiness on feet (R26.81);Other abnormalities of gait and mobility (R26.89);Muscle weakness (generalized) (M62.81)     Time: 1125-1150 PT Time Calculation (min) (ACUTE ONLY): 25 min  Charges:  $Therapeutic Exercise: 8-22 mins $Therapeutic Activity: 8-22 mins                     12:35 PM, 01/17/21 Lonell Grandchild, MPT Physical Therapist with [facility] [phone] office [phone] mobile phone

## 2021-01-18 ENCOUNTER — Ambulatory Visit (HOSPITAL_COMMUNITY): Payer: Medicare HMO

## 2021-01-18 ENCOUNTER — Ambulatory Visit (HOSPITAL_COMMUNITY): Payer: Medicare HMO | Admitting: Physician Assistant

## 2021-01-18 ENCOUNTER — Other Ambulatory Visit (HOSPITAL_COMMUNITY): Payer: Medicare HMO | Admitting: General Practice

## 2021-01-18 DIAGNOSIS — E1121 Type 2 diabetes mellitus with diabetic nephropathy: Secondary | ICD-10-CM | POA: Diagnosis not present

## 2021-01-18 DIAGNOSIS — I5032 Chronic diastolic (congestive) heart failure: Secondary | ICD-10-CM | POA: Diagnosis not present

## 2021-01-18 DIAGNOSIS — D649 Anemia, unspecified: Secondary | ICD-10-CM | POA: Diagnosis not present

## 2021-01-18 DIAGNOSIS — E1122 Type 2 diabetes mellitus with diabetic chronic kidney disease: Secondary | ICD-10-CM | POA: Diagnosis not present

## 2021-01-18 LAB — GLUCOSE, CAPILLARY
Glucose-Capillary: 96 mg/dL (ref 70–99)
Glucose-Capillary: 89 mg/dL (ref 70–99)
Glucose-Capillary: 77 mg/dL (ref 70–99)
Glucose-Capillary: 102 mg/dL — ABNORMAL HIGH (ref 70–99)

## 2021-01-18 LAB — PARATHYROID HORMONE, INTACT (NO CA): PTH: 71 pg/mL — ABNORMAL HIGH (ref 15–65)

## 2021-01-18 MED ORDER — CHLORHEXIDINE GLUCONATE CLOTH 2 % EX PADS
6.0000 | MEDICATED_PAD | Freq: Every day | CUTANEOUS | Status: DC
  Administered 2021-01-18 – 2021-01-21 (×2): 6 via TOPICAL

## 2021-01-18 NOTE — Plan of Care (Signed)
  Problem: Education: Goal: Knowledge of General Education information will improve Description Including pain rating scale, medication(s)/side effects and non-pharmacologic comfort measures Outcome: Progressing   

## 2021-01-18 NOTE — Progress Notes (Signed)
PROGRESS NOTE    Danny [name]  KGY:[id number] DOB: [birth year] DOA: 01/12/2021 PCP: Celene Squibb, MD   Chief Complaint  Patient presents with   Weakness    Brief admission narrative:  As per H&P written by Dr. Marily Memos on 01/12/2021 Danny [name] is a 65 y.o. male with medical history significant of Anemia of Chronic disease, CKD stage 5, DM, Chronic LE ulcers from venous stasis dermatitis and DM, HTN, Urinary retention. Pt states he has been an overall physical decline for several days w/o acute complaint. On day of admission pt unable to ambulate from bed and care for self. Profound generalized weakness. Denies CP, SOB, palpitations, ABD pain, dysuria, Frequency, rash, n/v, HA, fever, Cough, hematochezia, melena. Still on Augmentin for Bronchitis that has essentially resolved per pt. Started on 01/04/21. Leg wounds (chronic) were wrapped by home health aid on 01/10/21 per pt. No change in medication recenlyt and endorses compliance.    ED Course: Objective findings below. Started on 2 units PRBC infusion   Assessment & Plan: 1-Symptomatic anemia -And with underlying history of a stage V renal failure and anemia of chronic kidney disease. -Hemoglobin down to 7.0 causing symptomatic anemia. -2 units PRBCs have been given; posttransfusion level up to 8.7. -Hemoglobin has remained stable at 7.7 currently. -Will continue to follow hemoglobin trend intermittently. -Transfusion threshold is for hemoglobin less than 7. -IV iron and Epogen therapy as per renal function discretion. -No overt bleeding appreciated.  2-Diabetes mellitus ([place]) -will f continue to follow CBG's and further adjust hypoglycemic regimen accordingly. -continue SSI  3-Chronic diastolic CHF (congestive heart failure) (HCC) -Low-sodium diet encouraged. -Patient in need of dialysis initiation -Volume to be managed with dialysis treatment. -Continue to follow daily weights and strict I's and O's.  4-stage V renal  failure -Patient was due for temporary catheter placement and dialysis initiation as an outpatient. -General surgery has placed dialysis catheter 7/22; patient received with incidence failures hemodialysis treatment initiation on 01/15/2021. -Second dialysis treatment well-tolerated on 01/17/2021; per nephrology recommendations will receive third dialysis treatment on 01/19/2021 -Continue to follow renal diet, strict I's and O's and daily weight. -Continue to follow nephrology service recommendations for next dialysis management  5-secondary hyperparathyroidism -Will continue the use of Renvela -Will follow nephrology further recommendations -Follow electrolytes stability.    6-history of bronchitis/community-acquired pneumonia -Continue as needed bronchodilators -Patient has completed oral antibiotic and is currently afebrile. -Continue as needed antitussive/vasculitic agents and the use of flutter valve. -Will continue to wean off oxygen supplementation as tolerated.  7-essential hypertension -BP is soft; will adjust antihypertensive regimen -HD treatment helping volume and BP -Continue heart healthy diet.  8-hyperlipidemia -Continue statins. -Heart healthy diet encouraged.   DVT prophylaxis: Heparin Code Status: Full code Family Communication: No family at bedside. Disposition:   Status is: Inpatient  Remains inpatient appropriate because:IV treatments appropriate due to intensity of illness or inability to take PO  Dispo: The patient is from: Home              Anticipated d/c is to: SNF              Patient currently is not medically stable to d/c.   Difficult to place patient Yes    Consultants:  Nephrology service General surgery  Procedures:  See below for x-ray reports  Antimicrobials:  Augmentin last dose 01/13/2021.   Subjective:  No chest pain, no nausea, no vomiting; significantly weak and deconditioned.  Using 1-2 L nasal cannula supplementation.  No  overnight events; no overt bleeding.  Objective: Vitals:   01/18/21 0623 01/18/21 0848 01/18/21 0849 01/18/21 1417  BP: 125/76   117/75  Pulse: (!) 101   (!) 106  Resp: 18   18  Temp: 98.9 F (37.2 C)   (!) 97.5 F (36.4 C)  TempSrc:    Oral  SpO2: 96% (!) 81% 97% 98%  Weight:      Height:        Intake/Output Summary (Last 24 hours) at 01/18/2021 1540 Last data filed at 01/18/2021 1300 Gross per 24 hour  Intake 360 ml  Output 1600 ml  Net -1240 ml   Filed Weights   01/15/21 1415 01/15/21 1700 01/17/21 1350  Weight: (!) 137.2 kg (!) 137.3 kg (!) 136.4 kg    Examination: General exam: Alert, awake, oriented x 3; awfully weak and deconditioned; no chest pain, no nausea or vomiting.  Still using 1-2 L nasal cannula supplementation.  Slightly more interactive today but is still  reporting increased somnolence. Respiratory system: Good air movement bilaterally; no using accessory muscle. Cardiovascular system:RRR. No rubs or gallops; unable to properly assess JVD. Gastrointestinal system: Abdomen is obese, nondistended, soft and nontender. No organomegaly or masses felt. Normal bowel sounds heard. Central nervous system: Alert and oriented. No focal neurological deficits. Extremities/skin: No cyanosis or clubbing; trace to 1+ edema bilaterally appreciated.  Chronic excessive dermatitis with chronic wounds present on admission and associated to venous insufficiency; no superimposed infection seen. Psychiatry: Judgement and insight appear normal. Mood & affect appropriate.    Data Reviewed: I have personally reviewed following labs and imaging studies  CBC: Recent Labs  Lab 01/12/21 1202 01/13/21 0140 01/14/21 1552 01/15/21 1512 01/17/21 1409  WBC 7.4 8.5 11.6* 9.3 7.0  NEUTROABS 5.7  --   --   --   --   HGB 7.0* 8.7* 8.7* 7.7* 7.7*  HCT 22.3* 27.0* 27.4* 25.2* 25.0*  MCV 87.8 87.4 87.3 89.0 89.0  PLT 178 201 184 176 194    Basic Metabolic Panel: Recent Labs  Lab  01/13/21 0140 01/14/21 1552 01/15/21 1512 01/16/21 0506 01/17/21 1409  NA 136 134* 136 134* 133*  K 4.1 3.9 4.3 3.8 3.8  CL 104 103 102 100 100  CO2 23 25 26 27 25   GLUCOSE 147* 116* 103* 99 111*  BUN 61* 62* 67* 52* 62*  CREATININE 4.38* 4.78* 4.94* 3.74* 4.47*  CALCIUM 8.3* 8.0* 8.1* 8.1* 7.9*  MG 2.7*  --   --   --   --   PHOS 5.0* 5.0* 5.8* 4.5 5.6*    GFR: Estimated Creatinine Clearance: 23.2 mL/min (A) (by C-G formula based on SCr of 4.47 mg/dL (H)).  Liver Function Tests: Recent Labs  Lab 01/12/21 1202 01/14/21 1552 01/15/21 1512 01/16/21 0506 01/17/21 1409  AST 18  --   --   --   --   ALT 16  --   --   --   --   ALKPHOS 120  --   --   --   --   BILITOT 1.0  --   --   --   --   PROT 7.2  --   --   --   --   ALBUMIN 2.8* 2.5* 2.3* 2.3* 2.3*    CBG: Recent Labs  Lab 01/17/21 0712 01/17/21 1108 01/17/21 2028 01/18/21 0748 01/18/21 1159  GLUCAP 156* 123* 119* 96 89     Recent Results (from the past 240 hour(s))  Resp  Panel by RT-PCR (Flu A&B, Covid) Nasopharyngeal Swab     Status: None   Collection Time: 01/12/21 12:11 PM   Specimen: Nasopharyngeal Swab; Nasopharyngeal(NP) swabs in vial transport medium  Result Value Ref Range Status   SARS Coronavirus 2 by RT PCR NEGATIVE NEGATIVE Final    Comment: (NOTE) SARS-CoV-2 target nucleic acids are NOT DETECTED.  The SARS-CoV-2 RNA is generally detectable in upper respiratory specimens during the acute phase of infection. The lowest concentration of SARS-CoV-2 viral copies this assay can detect is 138 copies/mL. A negative result does not preclude SARS-Cov-2 infection and should not be used as the sole basis for treatment or other patient management decisions. A negative result may occur with  improper specimen collection/handling, submission of specimen other than nasopharyngeal swab, presence of viral mutation(s) within the areas targeted by this assay, and inadequate number of viral copies(<138  copies/mL). A negative result must be combined with clinical observations, patient history, and epidemiological information. The expected result is Negative.  Fact Sheet for Patients:  EntrepreneurPulse.com.au  Fact Sheet for Healthcare Providers:  IncredibleEmployment.be  This test is no t yet approved or cleared by the [place] FDA and  has been authorized for detection and/or diagnosis of SARS-CoV-2 by FDA under an Emergency Use Authorization (EUA). This EUA will remain  in effect (meaning this test can be used) for the duration of the COVID-19 declaration under Section 564(b)(1) of the Act, 21 U.S.C.section 360bbb-3(b)(1), unless the authorization is terminated  or revoked sooner.       Influenza A by PCR NEGATIVE NEGATIVE Final   Influenza B by PCR NEGATIVE NEGATIVE Final    Comment: (NOTE) The Xpert Xpress SARS-CoV-2/FLU/RSV plus assay is intended as an aid in the diagnosis of influenza from Nasopharyngeal swab specimens and should not be used as a sole basis for treatment. Nasal washings and aspirates are unacceptable for Xpert Xpress SARS-CoV-2/FLU/RSV testing.  Fact Sheet for Patients: EntrepreneurPulse.com.au  Fact Sheet for Healthcare Providers: IncredibleEmployment.be  This test is not yet approved or cleared by the [place] FDA and has been authorized for detection and/or diagnosis of SARS-CoV-2 by FDA under an Emergency Use Authorization (EUA). This EUA will remain in effect (meaning this test can be used) for the duration of the COVID-19 declaration under Section 564(b)(1) of the Act, 21 U.S.C. section 360bbb-3(b)(1), unless the authorization is terminated or revoked.  Performed at [facility], [address] [place], [place] [address]      Radiology Studies: No results found.   Scheduled Meds:  aspirin  325 mg Oral Daily   Chlorhexidine Gluconate Cloth  6 each Topical  Q0600   Chlorhexidine Gluconate Cloth  6 each Topical Q0600   Chlorhexidine Gluconate Cloth  6 each Topical Q0600   cholecalciferol  1,000 Units Oral Daily   darbepoetin (ARANESP) injection - DIALYSIS  100 mcg Intravenous Q Mon-HD   feeding supplement (GLUCERNA SHAKE)  237 mL Oral BID BM   fluticasone  1 spray Each Nare Daily   heparin  5,000 Units Subcutaneous Q8H   insulin aspart  0-9 Units Subcutaneous TID WC   insulin aspart protamine- aspart  10 Units Subcutaneous BID WC   metoprolol succinate  25 mg Oral Q12H   nystatin   Topical TID   pravastatin  80 mg Oral QPM   sevelamer carbonate  800 mg Oral BID WC   vitamin B-12  1,000 mcg Oral Daily   vitamin E  800 Units Oral Daily   zinc sulfate  220 mg Oral Daily   Continuous Infusions:  sodium chloride     sodium chloride     methocarbamol (ROBAXIN) IV       LOS: 6 days    Time spent: 30 minutes   Barton Dubois, MD [facility]   To contact the attending provider between 7A-7P or the covering provider during after hours 7P-7A, please log into the web site [email or web address] and access using universal [place] password for that web site. If you do not have the password, please call the hospital operator.  01/18/2021, 3:40 PM

## 2021-01-18 NOTE — Progress Notes (Signed)
[facility] Progress Note   Assessment/ Plan:    CKD V--> ESRD: will need to start dialysis based on OP plan and uremic symptoms.  [place] II 7/22 and HD #1 7/23, tx #2 7/25-  plan for tc #3 on 7/27.  Will need CLIP- I am told he will be going to [facility]-  has MWF chair-  Symptomatic anemia: aranesp-  also giving iron  HTN: reasonable control- norvasc 10/hydralazine 100 and topril-  may be able to wean BP meds as volume gets under better control -  have stopped norvasc and decreased hydral so will be able to pull volume with HD-  will now go ahead and stop hydralazine  DM II: per primary Bone/ mineral: Phos 5.6- renvela being given as corrected calcium is in the 9's-  no recent PTH-  will check one - pending  Venous stasis ulcer: has unna boots Dispo-  VERY deconditioned-  I guess plan is for SNF  ?     Subjective:    Had second HD yest-  removed 1600 but still hypotensive -  very weak and deconditioned but looks Danny little brighter this AM   Objective:   BP 125/76 (BP Location: Left Arm)   Pulse (!) 101   Temp 98.9 F (37.2 C)   Resp 18   Ht 6\' 1"  (1.854 m)   Wt (!) 136.4 kg   SpO2 96%   BMI 39.67 kg/m   Physical Exam: Gen- pale, difficult to get words out, very weak Chest-  dec BS at bases-  saturated dressing from [facility] Abd-  obese, soft, non tender Ext-  pitting edema   Labs: BMET Recent Labs  Lab 01/12/21 1202 01/13/21 0140 01/14/21 1552 01/15/21 1512 01/16/21 0506 01/17/21 1409  NA 135 136 134* 136 134* 133*  K 4.1 4.1 3.9 4.3 3.8 3.8  CL 104 104 103 102 100 100  CO2 25 23 25 26 27 25   GLUCOSE 153* 147* 116* 103* 99 111*  BUN 64* 61* 62* 67* 52* 62*  CREATININE 4.34* 4.38* 4.78* 4.94* 3.74* 4.47*  CALCIUM 8.4* 8.3* 8.0* 8.1* 8.1* 7.9*  PHOS  --  5.0* 5.0* 5.8* 4.5 5.6*   CBC Recent Labs  Lab 01/12/21 1202 01/13/21 0140 01/14/21 1552 01/15/21 1512 01/17/21 1409  WBC 7.4 8.5 11.6* 9.3 7.0  NEUTROABS 5.7  --   --   --   --   HGB  7.0* 8.7* 8.7* 7.7* 7.7*  HCT 22.3* 27.0* 27.4* 25.2* 25.0*  MCV 87.8 87.4 87.3 89.0 89.0  PLT 178 201 184 176 205      Medications:     aspirin  325 mg Oral Daily   Chlorhexidine Gluconate Cloth  6 each Topical Q0600   Chlorhexidine Gluconate Cloth  6 each Topical Q0600   cholecalciferol  1,000 Units Oral Daily   darbepoetin (ARANESP) injection - DIALYSIS  100 mcg Intravenous Q Mon-HD   feeding supplement (GLUCERNA SHAKE)  237 mL Oral BID BM   fluticasone  1 spray Each Nare Daily   heparin  5,000 Units Subcutaneous Q8H   hydrALAZINE  50 mg Oral BID   insulin aspart  0-9 Units Subcutaneous TID WC   insulin aspart protamine- aspart  10 Units Subcutaneous BID WC   metoprolol succinate  25 mg Oral Q12H   nystatin   Topical TID   pravastatin  80 mg Oral QPM   sevelamer carbonate  800 mg Oral BID WC   vitamin B-12  1,000 mcg  Oral Daily   vitamin E  800 Units Oral Daily   zinc sulfate  220 mg Oral Daily     Danny [name] Danny [name]  01/18/2021, 8:21 AM

## 2021-01-19 DIAGNOSIS — N186 End stage renal disease: Secondary | ICD-10-CM | POA: Diagnosis not present

## 2021-01-19 DIAGNOSIS — I5032 Chronic diastolic (congestive) heart failure: Secondary | ICD-10-CM | POA: Diagnosis not present

## 2021-01-19 DIAGNOSIS — D649 Anemia, unspecified: Secondary | ICD-10-CM | POA: Diagnosis not present

## 2021-01-19 LAB — RENAL FUNCTION PANEL
Albumin: 2.4 g/dL — ABNORMAL LOW (ref 3.5–5.0)
Anion gap: 6 (ref 5–15)
BUN: 47 mg/dL — ABNORMAL HIGH (ref 8–23)
CO2: 29 mmol/L (ref 22–32)
Calcium: 7.9 mg/dL — ABNORMAL LOW (ref 8.9–10.3)
Chloride: 99 mmol/L (ref 98–111)
Creatinine, Ser: 3.55 mg/dL — ABNORMAL HIGH (ref 0.61–1.24)
GFR, Estimated: 18 mL/min — ABNORMAL LOW (ref >60)
Glucose, Bld: 79 mg/dL (ref 70–99)
Phosphorus: 4 mg/dL (ref 2.5–4.6)
Potassium: 3.7 mmol/L (ref 3.5–5.1)
Sodium: 134 mmol/L — ABNORMAL LOW (ref 135–145)

## 2021-01-19 LAB — CBC
HCT: 26.9 % — ABNORMAL LOW (ref 39.0–52.0)
Hemoglobin: 8.1 g/dL — ABNORMAL LOW (ref 13.0–17.0)
MCH: 27 pg (ref 26.0–34.0)
MCHC: 30.1 g/dL (ref 30.0–36.0)
MCV: 89.7 fL (ref 80.0–100.0)
Platelets: 228 10*3/uL (ref 150–400)
RBC: 3 MIL/uL — ABNORMAL LOW (ref 4.22–5.81)
RDW: 18.6 % — ABNORMAL HIGH (ref 11.5–15.5)
WBC: 7.2 10*3/uL (ref 4.0–10.5)
nRBC: 0 % (ref 0.0–0.2)

## 2021-01-19 LAB — GLUCOSE, CAPILLARY
Glucose-Capillary: 87 mg/dL (ref 70–99)
Glucose-Capillary: 98 mg/dL (ref 70–99)
Glucose-Capillary: 87 mg/dL (ref 70–99)

## 2021-01-19 MED ORDER — APIXABAN 5 MG PO TABS: 5.0000 mg | ORAL_TABLET | Freq: Two times a day (BID) | ORAL | Status: DC

## 2021-01-19 MED ORDER — ALBUMIN HUMAN 25 % IV SOLN
INTRAVENOUS | Status: AC
  Administered 2021-01-19: 25 g
  Filled 2021-01-19: qty 100

## 2021-01-19 MED ORDER — APIXABAN 5 MG PO TABS
5.0000 mg | ORAL_TABLET | Freq: Two times a day (BID) | ORAL | Status: DC
  Administered 2021-01-19 – 2021-01-27 (×16): 5 mg via ORAL
  Filled 2021-01-19 (×16): qty 1

## 2021-01-19 MED ORDER — HEPARIN SODIUM (PORCINE) 1000 UNIT/ML IJ SOLN
INTRAMUSCULAR | Status: AC
  Administered 2021-01-19: 3800 [IU] via INTRAVENOUS_CENTRAL
  Filled 2021-01-19: qty 1

## 2021-01-19 NOTE — Progress Notes (Signed)
PROGRESS NOTE  Danny [name] JJH:[id number] DOB: [birth year] DOA: 01/12/2021 PCP: Celene Squibb, MD  Brief History:  As per H&P written by Dr. Marily Memos on 01/12/2021 Danny [name] is a 65 y.o. male with medical history significant of Anemia of Chronic disease, CKD stage 5, DM, Chronic LE ulcers from venous stasis dermatitis and DM, HTN, Urinary retention. Pt states he has been an overall physical decline for several days w/o acute complaint. On day of admission pt unable to ambulate from bed and care for self. Profound generalized weakness. Denies CP, SOB, palpitations, ABD pain, dysuria, Frequency, rash, n/v, HA, fever, Cough, hematochezia, melena. Still on Augmentin for Bronchitis that has essentially resolved per pt. Started on 01/04/21. Leg wounds (chronic) were wrapped by home health aid on 01/10/21 per pt. No change in medication recenlyt and endorses compliance.    ED Course: Objective findings below. Started on 2 units PRBC infusion  Assessment/Plan: stage V CKD>>New ESRD -General surgery has placed dialysis catheter 7/22; patient received with incidence failures hemodialysis treatment initiation on 01/15/2021. -Second dialysis treatment well-tolerated on 01/17/2021; per nephrology recommendations   -third dialysis treatment on 01/19/2021 -Continue to follow renal diet, strict I's and O's and daily weight. -Continue to follow nephrology service recommendations for next dialysis management  Acute Respiratory Failure with Hypoxia -due to fluid overload -stable on 2L -wean to RA  Symptomatic anemia -And with underlying history of a stage V renal failure and anemia of chronic kidney disease. -Hemoglobin down to 7.0 causing symptomatic anemia. -2 units PRBCs have been given; posttransfusion level up to 8.7. -Hemoglobin has remained stable post transfusion -Will continue to follow hemoglobin trend intermittently. -Transfusion threshold is for hemoglobin less than 7. -IV iron and Epogen  therapy as per renal function discretion. -No overt bleeding appreciated.   Diabetes mellitus type 2 -will f continue to follow CBG's and further adjust hypoglycemic regimen accordingly. -continue SSI -decrease 70/30 -7/24 A1C--6.8   Chronic diastolic CHF -Low-sodium diet encouraged. -Patient in need of dialysis initiation -Volume to be managed with dialysis treatment. -Continue to follow daily weights and strict I's and O's.   secondary hyperparathyroidism -Will continue the use of Renvela -Will follow nephrology further recommendations -Follow electrolytes stability.    essential hypertension -BP is soft -HD treatment helping volume and BP   hyperlipidemia -Continue statins. -Heart healthy diet encouraged.  Venous stasis ulcers -has Unna boots -appreciate wound care recommendations  Atrial Fibrillation, type unspecified --5/7 Echo EF 55-60%; mild decrease RV fxn, RVSP 61.5 -continue metoprolol succinate -start apixaban  Status is: Inpatient  Remains inpatient appropriate because:IV treatments appropriate due to intensity of illness or inability to take PO  Dispo: The patient is from: Home              Anticipated d/c is to: SNF              Patient currently is not medically stable to d/c.   Difficult to place patient No        Family Communication:   no Family at bedside  Consultants:  renal  Code Status:  FULL  DVT Prophylaxis:  apixaban   Procedures: As Listed in Progress Note Above  Antibiotics: None         Subjective: Patient denies fevers, chills, headache, chest pain, dyspnea, nausea, vomiting, diarrhea, abdominal pain, dysuria, hematuria, hematochezia, and melena.   Objective: Vitals:   01/19/21 1710 01/19/21 1730 01/19/21 1745 01/19/21 1800  BP:  130/80 132/78 118/77 126/78  Pulse: (!) 106 (!) 109 (!) 108 (!) 107  Resp:      Temp:      TempSrc:      SpO2:      Weight:      Height:        Intake/Output Summary (Last  24 hours) at 01/19/2021 1815 Last data filed at 01/19/2021 1300 Gross per 24 hour  Intake 480 ml  Output 450 ml  Net 30 ml   Weight change:  Exam:  General:  Pt is alert, follows commands appropriately, not in acute distress HEENT: No icterus, No thrush, No neck mass, /AT Cardiovascular: RRR, S1/S2, no rubs, no gallops Respiratory: bibasilar rales. No wheeze Abdomen: Soft/+BS, non tender, non distended, no guarding Extremities: 2 +LE edema, No lymphangitis, No petechiae, No rashes, no synovitis   Data Reviewed: I have personally reviewed following labs and imaging studies Basic Metabolic Panel: Recent Labs  Lab 01/13/21 0140 01/14/21 1552 01/15/21 1512 01/16/21 0506 01/17/21 1409  NA 136 134* 136 134* 133*  K 4.1 3.9 4.3 3.8 3.8  CL 104 103 102 100 100  CO2 23 25 26 27 25   GLUCOSE 147* 116* 103* 99 111*  BUN 61* 62* 67* 52* 62*  CREATININE 4.38* 4.78* 4.94* 3.74* 4.47*  CALCIUM 8.3* 8.0* 8.1* 8.1* 7.9*  MG 2.7*  --   --   --   --   PHOS 5.0* 5.0* 5.8* 4.5 5.6*   Liver Function Tests: Recent Labs  Lab 01/14/21 1552 01/15/21 1512 01/16/21 0506 01/17/21 1409  ALBUMIN 2.5* 2.3* 2.3* 2.3*   No results for input(s): LIPASE, AMYLASE in the last 168 hours. No results for input(s): AMMONIA in the last 168 hours. Coagulation Profile: No results for input(s): INR, PROTIME in the last 168 hours. CBC: Recent Labs  Lab 01/13/21 0140 01/14/21 1552 01/15/21 1512 01/17/21 1409  WBC 8.5 11.6* 9.3 7.0  HGB 8.7* 8.7* 7.7* 7.7*  HCT 27.0* 27.4* 25.2* 25.0*  MCV 87.4 87.3 89.0 89.0  PLT 201 184 176 205   Cardiac Enzymes: No results for input(s): CKTOTAL, CKMB, CKMBINDEX, TROPONINI in the last 168 hours. BNP: Invalid input(s): POCBNP CBG: Recent Labs  Lab 01/18/21 1607 01/18/21 2110 01/19/21 0715 01/19/21 1109 01/19/21 1609  GLUCAP 77 102* 87 98 87   HbA1C: No results for input(s): HGBA1C in the last 72 hours. Urine analysis:    Component Value Date/Time    COLORURINE YELLOW 11/29/2020 2134   APPEARANCEUR CLEAR 11/29/2020 2134   APPEARANCEUR Clear 09/10/2020 1122   LABSPEC 1.008 11/29/2020 2134   PHURINE 7.0 11/29/2020 2134   GLUCOSEU >=500 (A) 11/29/2020 2134   HGBUR NEGATIVE 11/29/2020 2134   BILIRUBINUR NEGATIVE 11/29/2020 2134   BILIRUBINUR Negative 09/10/2020 [place] 11/29/2020 2134   PROTEINUR 100 (A) 11/29/2020 2134   NITRITE NEGATIVE 11/29/2020 2134   LEUKOCYTESUR NEGATIVE 11/29/2020 2134   Sepsis Labs: @LABRCNTIP (procalcitonin:4,lacticidven:4) ) Recent Results (from the past 240 hour(s))  Resp Panel by RT-PCR (Flu A&B, Covid) Nasopharyngeal Swab     Status: None   Collection Time: 01/12/21 12:11 PM   Specimen: Nasopharyngeal Swab; Nasopharyngeal(NP) swabs in vial transport medium  Result Value Ref Range Status   SARS Coronavirus 2 by RT PCR NEGATIVE NEGATIVE Final    Comment: (NOTE) SARS-CoV-2 target nucleic acids are NOT DETECTED.  The SARS-CoV-2 RNA is generally detectable in upper respiratory specimens during the acute phase of infection. The lowest concentration of SARS-CoV-2 viral copies this  assay can detect is 138 copies/mL. A negative result does not preclude SARS-Cov-2 infection and should not be used as the sole basis for treatment or other patient management decisions. A negative result may occur with  improper specimen collection/handling, submission of specimen other than nasopharyngeal swab, presence of viral mutation(s) within the areas targeted by this assay, and inadequate number of viral copies(<138 copies/mL). A negative result must be combined with clinical observations, patient history, and epidemiological information. The expected result is Negative.  Fact Sheet for Patients:  EntrepreneurPulse.com.au  Fact Sheet for Healthcare Providers:  IncredibleEmployment.be  This test is no t yet approved or cleared by the [place] FDA and  has  been authorized for detection and/or diagnosis of SARS-CoV-2 by FDA under an Emergency Use Authorization (EUA). This EUA will remain  in effect (meaning this test can be used) for the duration of the COVID-19 declaration under Section 564(b)(1) of the Act, 21 U.S.C.section 360bbb-3(b)(1), unless the authorization is terminated  or revoked sooner.       Influenza A by PCR NEGATIVE NEGATIVE Final   Influenza B by PCR NEGATIVE NEGATIVE Final    Comment: (NOTE) The Xpert Xpress SARS-CoV-2/FLU/RSV plus assay is intended as an aid in the diagnosis of influenza from Nasopharyngeal swab specimens and should not be used as a sole basis for treatment. Nasal washings and aspirates are unacceptable for Xpert Xpress SARS-CoV-2/FLU/RSV testing.  Fact Sheet for Patients: EntrepreneurPulse.com.au  Fact Sheet for Healthcare Providers: IncredibleEmployment.be  This test is not yet approved or cleared by the [place] FDA and has been authorized for detection and/or diagnosis of SARS-CoV-2 by FDA under an Emergency Use Authorization (EUA). This EUA will remain in effect (meaning this test can be used) for the duration of the COVID-19 declaration under Section 564(b)(1) of the Act, 21 U.S.C. section 360bbb-3(b)(1), unless the authorization is terminated or revoked.  Performed at [facility], [address], [place], [place] [address]      Scheduled Meds:  aspirin  325 mg Oral Daily   Chlorhexidine Gluconate Cloth  6 each Topical Q0600   Chlorhexidine Gluconate Cloth  6 each Topical Q0600   Chlorhexidine Gluconate Cloth  6 each Topical Q0600   cholecalciferol  1,000 Units Oral Daily   darbepoetin (ARANESP) injection - DIALYSIS  100 mcg Intravenous Q Mon-HD   feeding supplement (GLUCERNA SHAKE)  237 mL Oral BID BM   fluticasone  1 spray Each Nare Daily   heparin  5,000 Units Subcutaneous Q8H   insulin aspart  0-9 Units Subcutaneous TID WC   insulin  aspart protamine- aspart  10 Units Subcutaneous BID WC   metoprolol succinate  25 mg Oral Q12H   nystatin   Topical TID   pravastatin  80 mg Oral QPM   sevelamer carbonate  800 mg Oral BID WC   vitamin B-12  1,000 mcg Oral Daily   vitamin E  800 Units Oral Daily   zinc sulfate  220 mg Oral Daily   Continuous Infusions:  sodium chloride     sodium chloride     albumin human     methocarbamol (ROBAXIN) IV      Procedures/Studies: DG Chest Port 1 View  Result Date: 01/14/2021 CLINICAL DATA:  Vascular dialysis catheter in place. EXAM: PORTABLE CHEST 1 VIEW COMPARISON:  Radiograph 01/12/2021. FINDINGS: New right internal jugular central venous catheter tip overlies the lower SVC. No pneumothorax. Stable cardiomegaly. Slight worsening right basilar aeration and blunting of the costophrenic angle, suspicious for  increasing pleural effusion and atelectasis. Vascular congestion. IMPRESSION: 1. New right internal jugular central venous catheter with tip overlying the lower SVC. No pneumothorax. 2. Slight worsening right basilar aeration and blunting of the costophrenic angle, suspicious for increasing pleural effusion and atelectasis. Unchanged cardiomegaly. Electronically Signed   By: Keith Rake M.D.   On: 01/14/2021 15:06   DG Chest Portable 1 View  Result Date: 01/12/2021 CLINICAL DATA:  Weakness. EXAM: PORTABLE CHEST 1 VIEW COMPARISON:  January 03, 2021. FINDINGS: Stable cardiomegaly. No pneumothorax or pleural effusion is noted. Stable bibasilar subsegmental atelectasis. Bony thorax is unremarkable. IMPRESSION: Stable bibasilar subsegmental atelectasis. Electronically Signed   By: Marijo Conception M.D.   On: 01/12/2021 12:11   DG Chest Port 1 View  Result Date: 01/03/2021 CLINICAL DATA:  Shortness of breath EXAM: PORTABLE CHEST 1 VIEW COMPARISON:  November 29, 2020 FINDINGS: Stable cardiomegaly. Aortic atherosclerosis. Bibasilar opacities, favor atelectasis. No visible pleural effusion or  pneumothorax. The visualized skeletal structures are unchanged. IMPRESSION: 1. Stable cardiomegaly. 2. Bibasilar opacities, favor atelectasis. 3.  Aortic Atherosclerosis (ICD10-I70.0). Electronically Signed   By: Dahlia Bailiff MD   On: 01/03/2021 18:29   DG C-Arm 1-60 Min-No Report  Result Date: 01/14/2021 Fluoroscopy was utilized by the requesting physician.  No radiographic interpretation.    Orson Eva, DO  [facility]  If 7PM-7AM, please contact night-coverage [email or web address] Password [email or web address] 01/19/2021, 6:15 PM   LOS: 7 days

## 2021-01-19 NOTE — Procedures (Signed)
   HEMODIALYSIS TREATMENT NOTE (HD#3):  HD was initiated with Albumin 25g bolus and BP remained stable throughout session.  As treatment was nearing the 3.5 hour mark we received word that the hospital elevators were out of order to return patient to 3A.  After discussion with Dr. Joelyn Oms, we decided to take advantage of the opportunity for further ultrafiltration and we extended treatment time with sequential UF only.    Total HD time 4.5 hours before switching to SEQ UF. Total run time:  5.5 hours Net fluid removed:  5.3 L     Rockwell Alexandria, RN

## 2021-01-19 NOTE — Progress Notes (Signed)
ANTICOAGULATION CONSULT NOTE - Initial Consult  Pharmacy Consult for apixaban Indication: atrial fibrillation  Allergies  Allergen Reactions   Dust Mite Extract Itching and Other (See Comments)    Unknown reaction-potential shortness of breath   Prednisone Nausea And Vomiting   Rocephin [Ceftriaxone] Nausea And Vomiting    Patient Measurements: Height: 6\' 1"  (185.4 cm) Weight: (!) 136.4 kg (300 lb 11.3 oz) IBW/kg (Calculated) : 79.9  Vital Signs: Temp: 97.9 F (36.6 C) (07/27 1705) Temp Source: Oral (07/27 1705) BP: 114/75 (07/27 1830) Pulse Rate: 112 (07/27 1830)  Labs: Recent Labs    01/17/21 1409 01/19/21 1705  HGB 7.7* 8.1*  HCT 25.0* 26.9*  PLT 205 228  CREATININE 4.47*  --     Estimated Creatinine Clearance: 23.2 mL/min (A) (by C-G formula based on SCr of 4.47 mg/dL (H)).   Medical History: Past Medical History:  Diagnosis Date   Anemia    Arthritis    CKD (chronic kidney disease), stage V (HCC)    Diabetes mellitus without complication (HCC)    Diastolic congestive heart failure (HCC)    Foot ulcer ([place])    Gout    Hypertension    Urinary retention     Medications:  Medications Prior to Admission  Medication Sig Dispense Refill Last Dose   acetaminophen (TYLENOL) 500 MG tablet Take 1,000 mg by mouth every 6 (six) hours as needed for moderate pain or headache.   unknown at unknown   amLODipine (NORVASC) 10 MG tablet Take 10 mg by mouth daily.   unknown   amoxicillin-clavulanate (AUGMENTIN) 875-125 MG tablet Take 1 tablet by mouth 2 (two) times daily. One po bid x 7 days (Patient taking differently: Take 1 tablet by mouth 2 (two) times daily. 7 day course prescribed on 01/04/2021) 14 tablet 0 unknown   Ascorbic Acid (VITAMIN C) 500 MG CHEW Chew 1,000 mg by mouth daily.      aspirin 325 MG tablet Take 325 mg by mouth daily.   unknown at unknown   cholecalciferol (VITAMIN D3) 25 MCG (1000 UNIT) tablet Take 1,000 Units by mouth daily.      collagenase  (SANTYL) ointment Apply to right lateral malleolus once daily; apply in a 1/8 inch layer and top with saline moistened gauze 2X2.  0    hydrALAZINE (APRESOLINE) 100 MG tablet Take 1 tablet (100 mg total) by mouth 3 (three) times daily. (Patient taking differently: Take 100 mg by mouth 2 (two) times daily.)   unknown   metoprolol succinate (TOPROL-XL) 25 MG 24 hr tablet Take 1 tablet (25 mg total) by mouth every evening.   unknown   Multiple Vitamin (MULTIVITAMIN WITH MINERALS) TABS tablet Take 1 tablet by mouth daily.      NOVOLIN 70/30 RELION (70-30) 100 UNIT/ML injection Inject 30 Units into the skin in the morning and at bedtime. (Patient taking differently: Inject 25 Units into the skin in the morning and at bedtime. Sliding scale)   unknown   Omega-3 Fatty Acids (FISH OIL PO) Take 2,400 mg by mouth 2 (two) times daily.      pravastatin (PRAVACHOL) 80 MG tablet Take 1 tablet (80 mg total) by mouth every evening. 30 tablet 0 unknown   sevelamer carbonate (RENVELA) 800 MG tablet Take 1 tablet (800 mg total) by mouth 3 (three) times daily with meals. (Patient taking differently: Take 800 mg by mouth 2 (two) times daily with a meal.)   unknown   torsemide (DEMADEX) 100 MG tablet Take 50  mg by mouth 2 (two) times daily.   unknown   vitamin B-12 (CYANOCOBALAMIN) 1000 MCG tablet Take 1,000 mcg by mouth daily.      vitamin E 1000 UNIT capsule Take 1,000 Units by mouth daily.      Zinc 30 MG CAPS Take 1 capsule by mouth daily.      ferrous sulfate 325 (65 FE) MG tablet Take 325 mg by mouth 2 (two) times daily with a meal.      Lactulose 20 GM/30ML SOLN Take 30 mLs (20 g total) by mouth daily. 450 mL 1    Nutritional Supplements (FEEDING SUPPLEMENT, NEPRO CARB STEADY,) LIQD Take 237 mLs by mouth 2 (two) times daily between meals.  0     Assessment: Danny [name] is a 65 y.o. male with medical history significant of Anemia of Chronic disease, CKD stage 5, and fairly new diagnosis of Afib. Looks like he was  to start Apixaban for this on a May admission, but I do not see any record of it being started prior to this admission. Will try to have med rec tech follow up to see if we can gain any more information about patient pharmacy, etc. Patient has been receiving heparin 5000 units subcutaneously every 8 hours as VTE prophylaxis, last dose at 1411. S/p 2 units of PRBC on 7/20, and hemoglobin is currently stable at 8.1. Patient is also currently taking aspirin 325 mg daily.   Goal of Therapy:  Therapeutic anticoagulation Monitor platelets by anticoagulation protocol: Yes   Plan:  Apixaban 5 mg PO BID Monitor CBC, renal function, and for signs and symptoms of bleeding F/u aspirin dose now that we are starting apixaban   Thank you for allowing [place] to participate in this patients care. Jens Som, PharmD 01/19/2021 8:43 PM  Please check [email or web address] for unit-specific pharmacy phone numbers.

## 2021-01-19 NOTE — Progress Notes (Addendum)
[facility] Progress Note   Assessment/ Plan:    CKD V--> ESRD: started dialysis based on OP plan and uremic symptoms.  [facility] 7/22 and HD #1 7/23, tx #2 7/25-  #3 today on 7/27 (shortened treatment time today given high inpatient census).  CLIP- will be going to [place]-  has MWF chair- next tx planned for Friday Symptomatic anemia: aranesp-  also giving iron. Transfuse for hgb <7  HTN: reasonable control- norvasc 10/hydralazine 100 and topril-  may be able to wean BP meds as volume gets under better control -  have stopped norvasc and hydral so will be able to pull volume with HD DM II: per primary Bone/ mineral: Phos 5.6- renvela being given as corrected calcium is in the 9's-  no recent PTH-  will check one - pending  Venous stasis ulcer: has unna boots Dispo-  deconditioned-  planning for snf?    Subjective:    No acute events. Still feels weak, denies SOB   Objective:   BP 137/87 (BP Location: Left Arm)   Pulse (!) 107   Temp (!) 97.5 F (36.4 C) (Oral)   Resp 19   Ht 6\' 1"  (1.854 m)   Wt (!) 136.4 kg   SpO2 98%   BMI 39.67 kg/m   Physical Exam: Gen- pale, very weak, NAD CV: s1s2, rrr Chest-  slightly decreased breath sounds bibasilar Abd-  obese, soft, non tender Ext-  trace pitting edema bilateral LE's Neuro: awake, alert, speech clear and coherent HD access: [facility]   Labs: BMET Recent Labs  Lab 01/12/21 1202 01/13/21 0140 01/14/21 1552 01/15/21 1512 01/16/21 0506 01/17/21 1409  NA 135 136 134* 136 134* 133*  K 4.1 4.1 3.9 4.3 3.8 3.8  CL 104 104 103 102 100 100  CO2 25 23 25 26 27 25   GLUCOSE 153* 147* 116* 103* 99 111*  BUN 64* 61* 62* 67* 52* 62*  CREATININE 4.34* 4.38* 4.78* 4.94* 3.74* 4.47*  CALCIUM 8.4* 8.3* 8.0* 8.1* 8.1* 7.9*  PHOS  --  5.0* 5.0* 5.8* 4.5 5.6*   CBC Recent Labs  Lab 01/12/21 1202 01/13/21 0140 01/14/21 1552 01/15/21 1512 01/17/21 1409  WBC 7.4 8.5 11.6* 9.3 7.0  NEUTROABS 5.7  --   --   --    --   HGB 7.0* 8.7* 8.7* 7.7* 7.7*  HCT 22.3* 27.0* 27.4* 25.2* 25.0*  MCV 87.8 87.4 87.3 89.0 89.0  PLT 178 201 184 176 205      Medications:     aspirin  325 mg Oral Daily   Chlorhexidine Gluconate Cloth  6 each Topical Q0600   Chlorhexidine Gluconate Cloth  6 each Topical Q0600   Chlorhexidine Gluconate Cloth  6 each Topical Q0600   cholecalciferol  1,000 Units Oral Daily   darbepoetin (ARANESP) injection - DIALYSIS  100 mcg Intravenous Q Mon-HD   feeding supplement (GLUCERNA SHAKE)  237 mL Oral BID BM   fluticasone  1 spray Each Nare Daily   heparin  5,000 Units Subcutaneous Q8H   insulin aspart  0-9 Units Subcutaneous TID WC   insulin aspart protamine- aspart  10 Units Subcutaneous BID WC   metoprolol succinate  25 mg Oral Q12H   nystatin   Topical TID   pravastatin  80 mg Oral QPM   sevelamer carbonate  800 mg Oral BID WC   vitamin B-12  1,000 mcg Oral Daily   vitamin E  800 Units Oral Daily   zinc  sulfate  220 mg Oral Daily     Gean Quint  01/19/2021, 9:44 AM

## 2021-01-20 DIAGNOSIS — D649 Anemia, unspecified: Secondary | ICD-10-CM | POA: Diagnosis not present

## 2021-01-20 DIAGNOSIS — N186 End stage renal disease: Secondary | ICD-10-CM | POA: Diagnosis not present

## 2021-01-20 DIAGNOSIS — G9341 Metabolic encephalopathy: Secondary | ICD-10-CM

## 2021-01-20 DIAGNOSIS — Z7189 Other specified counseling: Secondary | ICD-10-CM

## 2021-01-20 DIAGNOSIS — I5032 Chronic diastolic (congestive) heart failure: Secondary | ICD-10-CM | POA: Diagnosis not present

## 2021-01-20 LAB — AMMONIA: Ammonia: 25 umol/L (ref 9–35)

## 2021-01-20 LAB — URINALYSIS, ROUTINE W REFLEX MICROSCOPIC
Bilirubin Urine: NEGATIVE
Glucose, UA: NEGATIVE mg/dL
Ketones, ur: NEGATIVE mg/dL
Nitrite: NEGATIVE
Protein, ur: >=300 mg/dL — AB
Specific Gravity, Urine: 1.007 (ref 1.005–1.030)
WBC, UA: >50 WBC/hpf — ABNORMAL HIGH (ref 0–5)
pH: 9 — ABNORMAL HIGH (ref 5.0–8.0)

## 2021-01-20 LAB — T4, FREE: Free T4: 1.04 ng/dL (ref 0.61–1.12)

## 2021-01-20 LAB — GLUCOSE, CAPILLARY
Glucose-Capillary: 97 mg/dL (ref 70–99)
Glucose-Capillary: 94 mg/dL (ref 70–99)
Glucose-Capillary: 109 mg/dL — ABNORMAL HIGH (ref 70–99)
Glucose-Capillary: 101 mg/dL — ABNORMAL HIGH (ref 70–99)

## 2021-01-20 LAB — BLOOD GAS, VENOUS
Acid-Base Excess: 5.1 mmol/L — ABNORMAL HIGH (ref 0.0–2.0)
Bicarbonate: 28.6 mmol/L — ABNORMAL HIGH (ref 20.0–28.0)
FIO2: 28
O2 Saturation: 89 %
Patient temperature: 36.8
pCO2, Ven: 48.3 mmHg (ref 44.0–60.0)
pH, Ven: 7.405 (ref 7.250–7.430)
pO2, Ven: 54.7 mmHg — ABNORMAL HIGH (ref 32.0–45.0)

## 2021-01-20 LAB — FOLATE: Folate: 14.6 ng/mL (ref >5.9)

## 2021-01-20 LAB — TSH: TSH: 2.433 u[IU]/mL (ref 0.350–4.500)

## 2021-01-20 LAB — VITAMIN B12: Vitamin B-12: 3890 pg/mL — ABNORMAL HIGH (ref 180–914)

## 2021-01-20 MED ORDER — SODIUM CHLORIDE 0.9 % IV SOLN
1.0000 g | INTRAVENOUS | Status: DC
  Administered 2021-01-20 – 2021-01-22 (×2): 1 g via INTRAVENOUS
  Filled 2021-01-20 (×3): qty 10

## 2021-01-20 MED ORDER — INSULIN ASPART PROT & ASPART (70-30 MIX) 100 UNIT/ML ~~LOC~~ SUSP
5.0000 [IU] | Freq: Two times a day (BID) | SUBCUTANEOUS | Status: DC
  Administered 2021-01-20 – 2021-01-27 (×12): 5 [IU] via SUBCUTANEOUS

## 2021-01-20 NOTE — TOC Progression Note (Signed)
Transition of Care [facility]) - Progression Note    Patient Details  Name: Danny [name] MRN: [id number] Date of Birth: 09-Aug-1955  Transition of Care [facility]) CM/SW Contact  Boneta Lucks, RN Phone Number: 01/20/2021, 2:29 PM  Clinical Narrative:   [name] with [facility] called to confirm chair time with [facility] clinic.  Expected Discharge Plan: Skilled Nursing Facility Barriers to Discharge: Continued Medical Work up  Expected Discharge Plan and Services Expected Discharge Plan: [place]       Living arrangements for the past 2 months: Apartment                    Readmission Risk Interventions Readmission Risk Prevention Plan 01/13/2021 11/05/2020 03/24/2020  Transportation Screening Complete - Complete  PCP or Specialist Appt within 3-5 Days - Complete Not Complete  Not Complete comments - - -  HRI or [place] - - Complete  Social Work Consult for [place] Planning/Counseling - - Complete  Palliative Care Screening - - Not Applicable  Medication Review Press photographer) Complete - Complete  PCP or Specialist appointment within 3-5 days of discharge - - -  [place] or Home Care Consult Complete - -  SW Recovery Care/Counseling Consult Complete - -  Palliative Care Screening Not Complete - -  Skilled Nursing Facility Complete - -  Some recent data might be hidden

## 2021-01-20 NOTE — Progress Notes (Signed)
  [facility] Progress Note   Assessment/ Plan:    CKD V--> ESRD: started dialysis based on OP plan and uremic symptoms.  [facility] 7/22 and HD #1 7/23, tx #2 7/25-  #3 today on 7/27 (shortened treatment time today given high inpatient census).  CLIP- will be going to [place]-  has MWF chair- next tx planned for Friday Symptomatic anemia: aranesp-  also giving iron. Transfuse for hgb <7  HTN: bp acceptable, uf as tolerated with HD DM II: per primary Bone/ mineral: Phos 5.6- renvela being given as corrected calcium is in the 9's-  no recent PTH-  will check one - pending  Dysuria, pyuria: UA and ucx ordered Venous stasis ulcer: has unna boots Dispo-  deconditioned-  planning for snf?    Subjective:    No acute events. Was able to run longer on HD yesterday w/ seq, net UF 5.3L. staff had some concerns for pyuria/milky urine. Pt reported dysuria. Otherwise no other complaints.   Objective:   BP 125/72 (BP Location: Left Arm)   Pulse (!) 108 Comment: on tele box, pt is afib  Temp (!) 97.5 F (36.4 C) (Oral)   Resp 17   Ht 6\' 1"  (1.854 m)   Wt 129.1 kg   SpO2 96%   BMI 37.55 kg/m   Physical Exam: Gen- pale, very weak, NAD CV: s1s2, rrr Chest-  slightly decreased breath sounds bibasilar Abd-  obese, soft, non tender Ext-  trace pitting edema bilateral LE's Neuro: awake, alert, speech clear and coherent HD access: [facility]   Labs: BMET Recent Labs  Lab 01/14/21 1552 01/15/21 1512 01/16/21 0506 01/17/21 1409 01/19/21 1705  NA 134* 136 134* 133* 134*  K 3.9 4.3 3.8 3.8 3.7  CL 103 102 100 100 99  CO2 25 26 27 25 29   GLUCOSE 116* 103* 99 111* 79  BUN 62* 67* 52* 62* 47*  CREATININE 4.78* 4.94* 3.74* 4.47* 3.55*  CALCIUM 8.0* 8.1* 8.1* 7.9* 7.9*  PHOS 5.0* 5.8* 4.5 5.6* 4.0   CBC Recent Labs  Lab 01/14/21 1552 01/15/21 1512 01/17/21 1409 01/19/21 1705  WBC 11.6* 9.3 7.0 7.2  HGB 8.7* 7.7* 7.7* 8.1*  HCT 27.4* 25.2* 25.0* 26.9*  MCV 87.3 89.0  89.0 89.7  PLT 184 176 205 228      Medications:     apixaban  5 mg Oral BID   aspirin  325 mg Oral Daily   Chlorhexidine Gluconate Cloth  6 each Topical Q0600   Chlorhexidine Gluconate Cloth  6 each Topical Q0600   Chlorhexidine Gluconate Cloth  6 each Topical Q0600   cholecalciferol  1,000 Units Oral Daily   darbepoetin (ARANESP) injection - DIALYSIS  100 mcg Intravenous Q Mon-HD   feeding supplement (GLUCERNA SHAKE)  237 mL Oral BID BM   fluticasone  1 spray Each Nare Daily   insulin aspart  0-9 Units Subcutaneous TID WC   insulin aspart protamine- aspart  10 Units Subcutaneous BID WC   metoprolol succinate  25 mg Oral Q12H   nystatin   Topical TID   pravastatin  80 mg Oral QPM   sevelamer carbonate  800 mg Oral BID WC   vitamin B-12  1,000 mcg Oral Daily   vitamin E  800 Units Oral Daily   zinc sulfate  220 mg Oral Daily     Danny [name]  01/20/2021, 10:46 AM

## 2021-01-20 NOTE — Progress Notes (Addendum)
PROGRESS NOTE  Danny [name] YBO:[id number] DOB: 1955/07/15 DOA: 01/12/2021 PCP: Celene Squibb, MD   Brief History:  As per H&P written by Dr. Marily Memos on 01/12/2021 Danny [name] is a 65 y.o. male with medical history significant of Anemia of Chronic disease, CKD stage 5, DM, Chronic LE ulcers from venous stasis dermatitis and DM, HTN, Urinary retention. Pt states he has been an overall physical decline for several days w/o acute complaint. On day of admission pt unable to ambulate from bed and care for self. Profound generalized weakness. Denies CP, SOB, palpitations, ABD pain, dysuria, Frequency, rash, n/v, HA, fever, Cough, hematochezia, melena. Still on Augmentin for Bronchitis that has essentially resolved per pt. Started on 01/04/21. Leg wounds (chronic) were wrapped by home health aid on 01/10/21 per pt. No change in medication recenlyt and endorses compliance.    ED Course: Objective findings below. Started on 2 units PRBC infusion   Assessment/Plan: stage V CKD>>New ESRD -General surgery has placed dialysis catheter 7/22; patient received with incidence failures hemodialysis treatment initiation on 01/15/2021. -Second dialysis treatment well-tolerated on 01/17/2021; per nephrology recommendations   -third dialysis treatment on 01/19/2021 -Continue to follow renal diet, strict I's and O's and daily weight. -Continue to follow nephrology service recommendations for next dialysis management   Acute Respiratory Failure with Hypoxia -due to fluid overload -stable on 2L -wean to RA  Acute Metabolic Encephalopathy -pt has had cognitive and functional decline over last 6-12 months -multifactorial including renal failure, anemia, UTI -UA >50 WBC -check ammonia -check VBG -B12 -TSH   Symptomatic anemia -And with underlying history of a stage V renal failure and anemia of chronic kidney disease. -Hemoglobin down to 7.0 causing symptomatic anemia. -2 units PRBCs have been given;  posttransfusion Hgb remains largely stable -Hemoglobin has remained stable post transfusion -Will continue to follow hemoglobin trend intermittently. -Transfusion threshold is for hemoglobin less than 7. -IV iron and Epogen therapy as per renal function discretion. -No overt bleeding appreciated.   Diabetes mellitus type 2 -follow CBG's and further adjust hypoglycemic regimen accordingly. -continue SSI -decrease 70/30 -7/24 A1C--6.8   Chronic diastolic CHF -Low-sodium diet encouraged. -Patient in need of dialysis initiation -Volume to be managed with dialysis treatment. -Continue to follow daily weights and strict I's and O's.   secondary hyperparathyroidism -Will continue the use of Renvela -Will follow nephrology further recommendations -Follow electrolytes stability.     essential hypertension -BP is soft but improving -HD treatment helping volume and BP   hyperlipidemia -Continue statins. -Heart healthy diet encouraged.   Venous stasis ulcers -has Unna boots -appreciate wound care recommendations   Atrial Fibrillation, type unspecified --5/7 Echo EF 55-60%; mild decrease RV fxn, RVSP 61.5 -continue metoprolol succinate -start apixaban  Goals of Care -long discussion with sister -Advance care planning, including the explanation and discussion of advance directives was carried out with the patient and family.  Code status including explanations of "Full Code" and "DNR" and alternatives were discussed in detail.  Discussion of end-of-life issues including but not limited palliative care, hospice care and the concept of hospice, other end-of-life care options, power of attorney for health care decisions, living wills, and physician orders for life-sustaining treatment were also discussed with the patient and family.  Total face to face time 20 minutes. -change to DNR -if no change cognitively and functionally in next 2-3 weeks with HD and STR, then would consider  residential hospice  Status is: Inpatient   Remains inpatient appropriate because:IV treatments appropriate due to intensity of illness or inability to take PO   Dispo: The patient is from: Home              Anticipated d/c is to: SNF              Patient currently is not medically stable to d/c.              Difficult to place patient No               Family Communication:  sister and brother updated 7/28   Consultants:  renal   Code Status:  FULL   DVT Prophylaxis:  apixaban     Procedures: As Listed in Progress Note Above   Antibiotics: None    Total time spent 45 minutes.  Greater than 50% spent face to face counseling and coordinating care.     Subjective: Patient denies fevers, chills, headache, chest pain, dyspnea, nausea, vomiting, diarrhea, abdominal pain  Objective: Vitals:   01/19/21 2317 01/20/21 0900 01/20/21 1147 01/20/21 1352  BP:    129/83  Pulse: (!) 108   (!) 109  Resp:    20  Temp:    98.2 F (36.8 C)  TempSrc:    Oral  SpO2:   96% 100%  Weight:  129.1 kg    Height:        Intake/Output Summary (Last 24 hours) at 01/20/2021 1520 Last data filed at 01/20/2021 1432 Gross per 24 hour  Intake 200 ml  Output 7360 ml  Net -7160 ml   Weight change:  Exam:  General:  Pt is alert but confused, not in acute distress HEENT: No icterus, No thrush, No neck mass, [place]/AT Cardiovascular: RRR, S1/S2, no rubs, no gallops Respiratory: bibasilar crackles. No wheeze Abdomen: Soft/+BS, non tender, non distended, no guarding Extremities: 2+LE edema, No lymphangitis, No petechiae, No rashes, no synovitis   Data Reviewed: I have personally reviewed following labs and imaging studies Basic Metabolic Panel: Recent Labs  Lab 01/14/21 1552 01/15/21 1512 01/16/21 0506 01/17/21 1409 01/19/21 1705  NA 134* 136 134* 133* 134*  K 3.9 4.3 3.8 3.8 3.7  CL 103 102 100 100 99  CO2 25 26 27 25 29   GLUCOSE 116* 103* 99 111* 79  BUN 62* 67* 52* 62*  47*  CREATININE 4.78* 4.94* 3.74* 4.47* 3.55*  CALCIUM 8.0* 8.1* 8.1* 7.9* 7.9*  PHOS 5.0* 5.8* 4.5 5.6* 4.0   Liver Function Tests: Recent Labs  Lab 01/14/21 1552 01/15/21 1512 01/16/21 0506 01/17/21 1409 01/19/21 1705  ALBUMIN 2.5* 2.3* 2.3* 2.3* 2.4*   No results for input(s): LIPASE, AMYLASE in the last 168 hours. No results for input(s): AMMONIA in the last 168 hours. Coagulation Profile: No results for input(s): INR, PROTIME in the last 168 hours. CBC: Recent Labs  Lab 01/14/21 1552 01/15/21 1512 01/17/21 1409 01/19/21 1705  WBC 11.6* 9.3 7.0 7.2  HGB 8.7* 7.7* 7.7* 8.1*  HCT 27.4* 25.2* 25.0* 26.9*  MCV 87.3 89.0 89.0 89.7  PLT 184 176 205 228   Cardiac Enzymes: No results for input(s): CKTOTAL, CKMB, CKMBINDEX, TROPONINI in the last 168 hours. BNP: Invalid input(s): POCBNP CBG: Recent Labs  Lab 01/19/21 0715 01/19/21 1109 01/19/21 1609 01/20/21 0725 01/20/21 1102  GLUCAP 87 98 87 97 94   HbA1C: No results for input(s): HGBA1C in the last 72 hours. Urine analysis:    Component Value Date/Time  COLORURINE AMBER (A) 01/20/2021 1114   APPEARANCEUR TURBID (A) 01/20/2021 1114   APPEARANCEUR Clear 09/10/2020 1122   LABSPEC 1.007 01/20/2021 1114   PHURINE 9.0 (H) 01/20/2021 1114   GLUCOSEU NEGATIVE 01/20/2021 1114   HGBUR SMALL (A) 01/20/2021 1114   BILIRUBINUR NEGATIVE 01/20/2021 1114   BILIRUBINUR Negative 09/10/2020 1122   KETONESUR NEGATIVE 01/20/2021 1114   PROTEINUR >=300 (A) 01/20/2021 1114   NITRITE NEGATIVE 01/20/2021 1114   LEUKOCYTESUR LARGE (A) 01/20/2021 1114   Sepsis Labs: @LABRCNTIP (procalcitonin:4,lacticidven:4) ) Recent Results (from the past 240 hour(s))  Resp Panel by RT-PCR (Flu A&B, Covid) Nasopharyngeal Swab     Status: None   Collection Time: 01/12/21 12:11 PM   Specimen: Nasopharyngeal Swab; Nasopharyngeal(NP) swabs in vial transport medium  Result Value Ref Range Status   SARS Coronavirus 2 by RT PCR NEGATIVE NEGATIVE  Final    Comment: (NOTE) SARS-CoV-2 target nucleic acids are NOT DETECTED.  The SARS-CoV-2 RNA is generally detectable in upper respiratory specimens during the acute phase of infection. The lowest concentration of SARS-CoV-2 viral copies this assay can detect is 138 copies/mL. A negative result does not preclude SARS-Cov-2 infection and should not be used as the sole basis for treatment or other patient management decisions. A negative result may occur with  improper specimen collection/handling, submission of specimen other than nasopharyngeal swab, presence of viral mutation(s) within the areas targeted by this assay, and inadequate number of viral copies(<138 copies/mL). A negative result must be combined with clinical observations, patient history, and epidemiological information. The expected result is Negative.  Fact Sheet for Patients:  EntrepreneurPulse.com.au  Fact Sheet for Healthcare Providers:  IncredibleEmployment.be  This test is no t yet approved or cleared by the [place] FDA and  has been authorized for detection and/or diagnosis of SARS-CoV-2 by FDA under an Emergency Use Authorization (EUA). This EUA will remain  in effect (meaning this test can be used) for the duration of the COVID-19 declaration under Section 564(b)(1) of the Act, 21 U.S.C.section 360bbb-3(b)(1), unless the authorization is terminated  or revoked sooner.       Influenza A by PCR NEGATIVE NEGATIVE Final   Influenza B by PCR NEGATIVE NEGATIVE Final    Comment: (NOTE) The Xpert Xpress SARS-CoV-2/FLU/RSV plus assay is intended as an aid in the diagnosis of influenza from Nasopharyngeal swab specimens and should not be used as a sole basis for treatment. Nasal washings and aspirates are unacceptable for Xpert Xpress SARS-CoV-2/FLU/RSV testing.  Fact Sheet for Patients: EntrepreneurPulse.com.au  Fact Sheet for Healthcare  Providers: IncredibleEmployment.be  This test is not yet approved or cleared by the [place] FDA and has been authorized for detection and/or diagnosis of SARS-CoV-2 by FDA under an Emergency Use Authorization (EUA). This EUA will remain in effect (meaning this test can be used) for the duration of the COVID-19 declaration under Section 564(b)(1) of the Act, 21 U.S.C. section 360bbb-3(b)(1), unless the authorization is terminated or revoked.  Performed at [facility], [address] [place],  [address]      Scheduled Meds:  apixaban  5 mg Oral BID   aspirin  325 mg Oral Daily   Chlorhexidine Gluconate Cloth  6 each Topical Q0600   Chlorhexidine Gluconate Cloth  6 each Topical Q0600   Chlorhexidine Gluconate Cloth  6 each Topical Q0600   cholecalciferol  1,000 Units Oral Daily   darbepoetin (ARANESP) injection - DIALYSIS  100 mcg Intravenous Q Mon-HD   feeding supplement (GLUCERNA SHAKE)  237 mL  Oral BID BM   fluticasone  1 spray Each Nare Daily   insulin aspart  0-9 Units Subcutaneous TID WC   insulin aspart protamine- aspart  5 Units Subcutaneous BID WC   metoprolol succinate  25 mg Oral Q12H   nystatin   Topical TID   pravastatin  80 mg Oral QPM   sevelamer carbonate  800 mg Oral BID WC   vitamin B-12  1,000 mcg Oral Daily   vitamin E  800 Units Oral Daily   zinc sulfate  220 mg Oral Daily   Continuous Infusions:  sodium chloride     sodium chloride     methocarbamol (ROBAXIN) IV      Procedures/Studies: DG Chest Port 1 View  Result Date: 01/14/2021 CLINICAL DATA:  Vascular dialysis catheter in place. EXAM: PORTABLE CHEST 1 VIEW COMPARISON:  Radiograph 01/12/2021. FINDINGS: New right internal jugular central venous catheter tip overlies the lower SVC. No pneumothorax. Stable cardiomegaly. Slight worsening right basilar aeration and blunting of the costophrenic angle, suspicious for increasing pleural effusion and atelectasis. Vascular  congestion. IMPRESSION: 1. New right internal jugular central venous catheter with tip overlying the lower SVC. No pneumothorax. 2. Slight worsening right basilar aeration and blunting of the costophrenic angle, suspicious for increasing pleural effusion and atelectasis. Unchanged cardiomegaly. Electronically Signed   By: Keith Rake M.D.   On: 01/14/2021 15:06   DG Chest Portable 1 View  Result Date: 01/12/2021 CLINICAL DATA:  Weakness. EXAM: PORTABLE CHEST 1 VIEW COMPARISON:  January 03, 2021. FINDINGS: Stable cardiomegaly. No pneumothorax or pleural effusion is noted. Stable bibasilar subsegmental atelectasis. Bony thorax is unremarkable. IMPRESSION: Stable bibasilar subsegmental atelectasis. Electronically Signed   By: Marijo Conception M.D.   On: 01/12/2021 12:11   DG Chest Port 1 View  Result Date: 01/03/2021 CLINICAL DATA:  Shortness of breath EXAM: PORTABLE CHEST 1 VIEW COMPARISON:  November 29, 2020 FINDINGS: Stable cardiomegaly. Aortic atherosclerosis. Bibasilar opacities, favor atelectasis. No visible pleural effusion or pneumothorax. The visualized skeletal structures are unchanged. IMPRESSION: 1. Stable cardiomegaly. 2. Bibasilar opacities, favor atelectasis. 3.  Aortic Atherosclerosis (ICD10-I70.0). Electronically Signed   By: Dahlia Bailiff MD   On: 01/03/2021 18:29   DG C-Arm 1-60 Min-No Report  Result Date: 01/14/2021 Fluoroscopy was utilized by the requesting physician.  No radiographic interpretation.    Orson Eva, DO  [facility]  If 7PM-7AM, please contact night-coverage [email or web address] Password [email or web address] 01/20/2021, 3:20 PM   LOS: 8 days

## 2021-01-20 NOTE — Progress Notes (Signed)
DNR bracelet placed on patients left wrist

## 2021-01-20 NOTE — Progress Notes (Signed)
Patient more confused than this morning, talking about " collecting 5 people". Urine was obtained per order by Dr. Candiss Norse . Did make Dr. Carles Collet aware of patient increased confusion .

## 2021-01-20 NOTE — Plan of Care (Signed)

## 2021-01-21 DIAGNOSIS — D649 Anemia, unspecified: Secondary | ICD-10-CM | POA: Diagnosis not present

## 2021-01-21 DIAGNOSIS — G9341 Metabolic encephalopathy: Secondary | ICD-10-CM | POA: Diagnosis not present

## 2021-01-21 DIAGNOSIS — N186 End stage renal disease: Secondary | ICD-10-CM | POA: Diagnosis not present

## 2021-01-21 DIAGNOSIS — I5032 Chronic diastolic (congestive) heart failure: Secondary | ICD-10-CM | POA: Diagnosis not present

## 2021-01-21 LAB — GLUCOSE, CAPILLARY
Glucose-Capillary: 106 mg/dL — ABNORMAL HIGH (ref 70–99)
Glucose-Capillary: 135 mg/dL — ABNORMAL HIGH (ref 70–99)
Glucose-Capillary: 126 mg/dL — ABNORMAL HIGH (ref 70–99)
Glucose-Capillary: 90 mg/dL (ref 70–99)

## 2021-01-21 NOTE — Care Management Important Message (Signed)
Important Message  Patient Details  Name: Danny [name] MRN: [id number] Date of Birth: 06/14/1956   Medicare Important Message Given:  Yes     [name] 01/21/2021, 11:50 AM

## 2021-01-21 NOTE — TOC Progression Note (Signed)
Transition of Care [facility]) - Progression Note    Patient Details  Name: Danny [name] MRN: [id number] Date of Birth: 1956-06-21  Transition of Care [facility]) CM/SW Contact  Boneta Lucks, RN Phone Number: 01/21/2021, 12:20 PM  Clinical Narrative:   [place] Octave confirmed a chair time for [facility] facility.  TTS chair arrive at 6:45AM for treatment at 7AM.  MD/ RN updated.  HD rescheduled for tomorrow to get him on TTS schedule. Debbie with [facility] updated. TOC waiting to confirm INS AUTH expiration with Debbie.  Expected Discharge Plan: Skilled Nursing Facility Barriers to Discharge: Continued Medical Work up  Expected Discharge Plan and Services Expected Discharge Plan: [place]      Living arrangements for the past 2 months: Apartment                     Readmission Risk Interventions Readmission Risk Prevention Plan 01/13/2021 11/05/2020 03/24/2020  Transportation Screening Complete - Complete  PCP or Specialist Appt within 3-5 Days - Complete Not Complete  Not Complete comments - - -  HRI or [place] - - Complete  Social Work Consult for [place] Planning/Counseling - - Complete  Palliative Care Screening - - Not Applicable  Medication Review [employer] photographer) Complete - Complete  PCP or Specialist appointment within 3-5 days of discharge - - -  [place] or Home Care Consult Complete - -  SW Recovery Care/Counseling Consult Complete - -  Palliative Care Screening Not Complete - -  Skilled Nursing Facility Complete - -  Some recent data might be hidden

## 2021-01-21 NOTE — Progress Notes (Signed)
PROGRESS NOTE  Danny [name] GLO:[id number] DOB: [birth year] DOA: 01/12/2021 PCP: Celene Squibb, MD  Brief History:  As per H&P written by Dr. Marily Memos on 01/12/2021 Danny [name] is a 65 y.o. male with medical history significant of Anemia of Chronic disease, CKD stage 5, DM, Chronic LE ulcers from venous stasis dermatitis and DM, HTN, Urinary retention. Pt states he has been an overall physical decline for several days w/o acute complaint. On day of admission pt unable to ambulate from bed and care for self. Profound generalized weakness. Denies CP, SOB, palpitations, ABD pain, dysuria, Frequency, rash, n/v, HA, fever, Cough, hematochezia, melena. Still on Augmentin for Bronchitis that has essentially resolved per pt. Started on 01/04/21. Leg wounds (chronic) were wrapped by home health aid on 01/10/21 per pt. No change in medication recenlyt and endorses compliance.    ED Course: Objective findings below. Started on 2 units PRBC infusion   Assessment/Plan: stage V CKD>>New ESRD -General surgery has placed dialysis catheter 7/22; -hemodialysis treatment initiation on 01/15/2021. -Second dialysis treatment well-tolerated on 01/17/2021; per nephrology recommendations   -third dialysis treatment on 01/19/2021 -next HD on 7/29 -Continue to follow renal diet, strict I's and O's and daily weight. -Continue to follow nephrology service recommendations for next dialysis management   Acute Respiratory Failure with Hypoxia -due to fluid overload -stable on 2L -wean to RA   Acute Metabolic Encephalopathy -pt has had cognitive and functional decline over last 6-12 months -multifactorial including renal failure, anemia, UTI -UA >50 WBC -check ammonia--25 -check VBG--7.405/48/54 on 2L -B12--3890 -TSH--2.433 -continue empiric ceftriaxone pending cultures   Symptomatic anemia -And with underlying history of a stage V renal failure and anemia of chronic kidney disease. -Hemoglobin down to 7.0  causing symptomatic anemia. -2 units PRBCs have been given; posttransfusion Hgb remains largely stable -Hemoglobin has remained stable post transfusion -Will continue to follow hemoglobin trend intermittently. -Transfusion threshold is for hemoglobin less than 7. -IV iron and Epogen therapy as per renal function discretion. -No overt bleeding appreciated.   Diabetes mellitus type 2 -follow CBG's and further adjust hypoglycemic regimen accordingly. -continue SSI -decreased 70/30 -7/24 A1C--6.8   Chronic diastolic CHF -Low-sodium diet encouraged. -Patient in need of dialysis initiation -Volume to be managed with dialysis treatment. -Continue to follow daily weights and strict I's and O's.   secondary hyperparathyroidism -Will continue the use of Renvela -Will follow nephrology further recommendations -Follow electrolytes stability.     essential hypertension -BP is soft but improving -HD treatment helping volume and BP   hyperlipidemia -Continue statins. -Heart healthy diet encouraged.   Venous stasis ulcers -has Unna boots -appreciate wound care recommendations>>> Daily cleanse with NS, pat dry. Cover wound with silver hydrofiber (Aquacel Ag+ Advantage), top with dry dressing, ABD and secure with Kerlix/paper tape.  Place foot into [employer].  May moisten with NS, as needed for removal   Atrial Fibrillation, type unspecified --5/7 Echo EF 55-60%; mild decrease RV fxn, RVSP 61.5 -continue metoprolol succinate -continue apixaban   Goals of Care -long discussion with sister and brother 7/28 -Advance care planning, including the explanation and discussion of advance directives was carried out with the patient and family.  Code status including explanations of "Full Code" and "DNR" and alternatives were discussed in detail.  Discussion of end-of-life issues including but not limited palliative care, hospice care and the concept of hospice, other end-of-life care options,  power of attorney for health care decisions, living  wills, and physician orders for life-sustaining treatment were also discussed with the patient and family.  Total face to face time 20 minutes. -change to DNR -if no change cognitively and functionally in next 2-3 weeks with HD and STR, then would consider residential hospice       Status is: Inpatient   Remains inpatient appropriate because:IV treatments appropriate due to intensity of illness or inability to take PO   Dispo: The patient is from: Home              Anticipated d/c is to: SNF              Patient currently is not medically stable to d/c.              Difficult to place patient No               Family Communication:  sister updated 7/29   Consultants:  renal   Code Status:  FULL   DVT Prophylaxis:  apixaban     Procedures: As Listed in Progress Note Above   Antibiotics: Ceftriaxone 7/28>>      Subjective: Patient denies fevers, chills, headache, chest pain, dyspnea, nausea, vomiting, diarrhea, abdominal pain, dysuria, hematuria, hematochezia, and melena.   Objective: Vitals:   01/20/21 2042 01/21/21 0400 01/21/21 0442 01/21/21 1414  BP: 124/84  135/68 132/68  Pulse: (!) 109  90 (!) 110  Resp: 20  17   Temp: 97.6 F (36.4 C)  98.1 F (36.7 C) 98.7 F (37.1 C)  TempSrc: Oral  Oral Oral  SpO2: 100%  98% 99%  Weight:  129.8 kg    Height:        Intake/Output Summary (Last 24 hours) at 01/21/2021 1741 Last data filed at 01/21/2021 0900 Gross per 24 hour  Intake 120 ml  Output 550 ml  Net -430 ml   Weight change:  Exam:  General:  Pt is alert, follows commands appropriately, not in acute distress HEENT: No icterus, No thrush, No neck mass, /AT Cardiovascular: IRRR, S1/S2, no rubs, no gallops Respiratory: bibasilar crackles. No wheeze Abdomen: Soft/+BS, non tender, non distended, no guarding Extremities: 2+ LE edema, No lymphangitis, No petechiae, No rashes, no synovitis   Data  Reviewed: I have personally reviewed following labs and imaging studies Basic Metabolic Panel: Recent Labs  Lab 01/15/21 1512 01/16/21 0506 01/17/21 1409 01/19/21 1705  NA 136 134* 133* 134*  K 4.3 3.8 3.8 3.7  CL 102 100 100 99  CO2 26 27 25 29   GLUCOSE 103* 99 111* 79  BUN 67* 52* 62* 47*  CREATININE 4.94* 3.74* 4.47* 3.55*  CALCIUM 8.1* 8.1* 7.9* 7.9*  PHOS 5.8* 4.5 5.6* 4.0   Liver Function Tests: Recent Labs  Lab 01/15/21 1512 01/16/21 0506 01/17/21 1409 01/19/21 1705  ALBUMIN 2.3* 2.3* 2.3* 2.4*   No results for input(s): LIPASE, AMYLASE in the last 168 hours. Recent Labs  Lab 01/20/21 1535  AMMONIA 25   Coagulation Profile: No results for input(s): INR, PROTIME in the last 168 hours. CBC: Recent Labs  Lab 01/15/21 1512 01/17/21 1409 01/19/21 1705  WBC 9.3 7.0 7.2  HGB 7.7* 7.7* 8.1*  HCT 25.2* 25.0* 26.9*  MCV 89.0 89.0 89.7  PLT 176 205 228   Cardiac Enzymes: No results for input(s): CKTOTAL, CKMB, CKMBINDEX, TROPONINI in the last 168 hours. BNP: Invalid input(s): POCBNP CBG: Recent Labs  Lab 01/20/21 1614 01/20/21 2042 01/21/21 0728 01/21/21 1118 01/21/21 1613  GLUCAP 109* 101*  106* 135* 126*   HbA1C: No results for input(s): HGBA1C in the last 72 hours. Urine analysis:    Component Value Date/Time   COLORURINE AMBER (A) 01/20/2021 1114   APPEARANCEUR TURBID (A) 01/20/2021 1114   APPEARANCEUR Clear 09/10/2020 1122   LABSPEC 1.007 01/20/2021 1114   PHURINE 9.0 (H) 01/20/2021 1114   GLUCOSEU NEGATIVE 01/20/2021 1114   HGBUR SMALL (A) 01/20/2021 1114   BILIRUBINUR NEGATIVE 01/20/2021 1114   BILIRUBINUR Negative 09/10/2020 1122   KETONESUR NEGATIVE 01/20/2021 1114   PROTEINUR >=300 (A) 01/20/2021 1114   NITRITE NEGATIVE 01/20/2021 1114   LEUKOCYTESUR LARGE (A) 01/20/2021 1114   Sepsis Labs: @LABRCNTIP (procalcitonin:4,lacticidven:4) ) Recent Results (from the past 240 hour(s))  Resp Panel by RT-PCR (Flu A&B, Covid)  Nasopharyngeal Swab     Status: None   Collection Time: 01/12/21 12:11 PM   Specimen: Nasopharyngeal Swab; Nasopharyngeal(NP) swabs in vial transport medium  Result Value Ref Range Status   SARS Coronavirus 2 by RT PCR NEGATIVE NEGATIVE Final    Comment: (NOTE) SARS-CoV-2 target nucleic acids are NOT DETECTED.  The SARS-CoV-2 RNA is generally detectable in upper respiratory specimens during the acute phase of infection. The lowest concentration of SARS-CoV-2 viral copies this assay can detect is 138 copies/mL. A negative result does not preclude SARS-Cov-2 infection and should not be used as the sole basis for treatment or other patient management decisions. A negative result may occur with  improper specimen collection/handling, submission of specimen other than nasopharyngeal swab, presence of viral mutation(s) within the areas targeted by this assay, and inadequate number of viral copies(<138 copies/mL). A negative result must be combined with clinical observations, patient history, and epidemiological information. The expected result is Negative.  Fact Sheet for Patients:  EntrepreneurPulse.com.au  Fact Sheet for Healthcare Providers:  IncredibleEmployment.be  This test is no t yet approved or cleared by the [place] FDA and  has been authorized for detection and/or diagnosis of SARS-CoV-2 by FDA under an Emergency Use Authorization (EUA). This EUA will remain  in effect (meaning this test can be used) for the duration of the COVID-19 declaration under Section 564(b)(1) of the Act, 21 U.S.C.section 360bbb-3(b)(1), unless the authorization is terminated  or revoked sooner.       Influenza A by PCR NEGATIVE NEGATIVE Final   Influenza B by PCR NEGATIVE NEGATIVE Final    Comment: (NOTE) The Xpert Xpress SARS-CoV-2/FLU/RSV plus assay is intended as an aid in the diagnosis of influenza from Nasopharyngeal swab specimens and should not be  used as a sole basis for treatment. Nasal washings and aspirates are unacceptable for Xpert Xpress SARS-CoV-2/FLU/RSV testing.  Fact Sheet for Patients: EntrepreneurPulse.com.au  Fact Sheet for Healthcare Providers: IncredibleEmployment.be  This test is not yet approved or cleared by the [place] FDA and has been authorized for detection and/or diagnosis of SARS-CoV-2 by FDA under an Emergency Use Authorization (EUA). This EUA will remain in effect (meaning this test can be used) for the duration of the COVID-19 declaration under Section 564(b)(1) of the Act, 21 U.S.C. section 360bbb-3(b)(1), unless the authorization is terminated or revoked.  Performed at [facility], [address], [place], [place] [address]      Scheduled Meds:  apixaban  5 mg Oral BID   Chlorhexidine Gluconate Cloth  6 each Topical Q0600   Chlorhexidine Gluconate Cloth  6 each Topical Q0600   Chlorhexidine Gluconate Cloth  6 each Topical Q0600   cholecalciferol  1,000 Units Oral Daily   darbepoetin (ARANESP)  injection - DIALYSIS  100 mcg Intravenous Q Mon-HD   feeding supplement (GLUCERNA SHAKE)  237 mL Oral BID BM   fluticasone  1 spray Each Nare Daily   insulin aspart  0-9 Units Subcutaneous TID WC   insulin aspart protamine- aspart  5 Units Subcutaneous BID WC   metoprolol succinate  25 mg Oral Q12H   nystatin   Topical TID   pravastatin  80 mg Oral QPM   sevelamer carbonate  800 mg Oral BID WC   vitamin B-12  1,000 mcg Oral Daily   vitamin E  800 Units Oral Daily   zinc sulfate  220 mg Oral Daily   Continuous Infusions:  sodium chloride     sodium chloride     cefTRIAXone (ROCEPHIN)  IV 1 g (01/20/21 1725)   methocarbamol (ROBAXIN) IV      Procedures/Studies: DG Chest Port 1 View  Result Date: 01/14/2021 CLINICAL DATA:  Vascular dialysis catheter in place. EXAM: PORTABLE CHEST 1 VIEW COMPARISON:  Radiograph 01/12/2021. FINDINGS: New right internal  jugular central venous catheter tip overlies the lower SVC. No pneumothorax. Stable cardiomegaly. Slight worsening right basilar aeration and blunting of the costophrenic angle, suspicious for increasing pleural effusion and atelectasis. Vascular congestion. IMPRESSION: 1. New right internal jugular central venous catheter with tip overlying the lower SVC. No pneumothorax. 2. Slight worsening right basilar aeration and blunting of the costophrenic angle, suspicious for increasing pleural effusion and atelectasis. Unchanged cardiomegaly. Electronically Signed   By: Keith Rake M.D.   On: 01/14/2021 15:06   DG Chest Portable 1 View  Result Date: 01/12/2021 CLINICAL DATA:  Weakness. EXAM: PORTABLE CHEST 1 VIEW COMPARISON:  January 03, 2021. FINDINGS: Stable cardiomegaly. No pneumothorax or pleural effusion is noted. Stable bibasilar subsegmental atelectasis. Bony thorax is unremarkable. IMPRESSION: Stable bibasilar subsegmental atelectasis. Electronically Signed   By: Marijo Conception M.D.   On: 01/12/2021 12:11   DG Chest Port 1 View  Result Date: 01/03/2021 CLINICAL DATA:  Shortness of breath EXAM: PORTABLE CHEST 1 VIEW COMPARISON:  November 29, 2020 FINDINGS: Stable cardiomegaly. Aortic atherosclerosis. Bibasilar opacities, favor atelectasis. No visible pleural effusion or pneumothorax. The visualized skeletal structures are unchanged. IMPRESSION: 1. Stable cardiomegaly. 2. Bibasilar opacities, favor atelectasis. 3.  Aortic Atherosclerosis (ICD10-I70.0). Electronically Signed   By: Dahlia Bailiff MD   On: 01/03/2021 18:29   DG C-Arm 1-60 Min-No Report  Result Date: 01/14/2021 Fluoroscopy was utilized by the requesting physician.  No radiographic interpretation.    Orson Eva, DO  [facility]  If 7PM-7AM, please contact night-coverage [email or web address] Password [email or web address] 01/21/2021, 5:41 PM   LOS: 9 days

## 2021-01-21 NOTE — Progress Notes (Signed)
Physical Therapy Treatment Patient Details Name: Danny [name] MRN: [id number] DOB: November 26, 1955 Today's Date: 01/21/2021    History of Present Illness Danny [name] is a 65 y.o. male with medical history significant of Anemia of Chronic disease, CKD stage 5, DM, Chronic LE ulcers from venous stasis dermatitis and DM, HTN, Urinary retention. Pt states he has been an overall physical decline for several days w/o acute complaint. On day of admission pt unable to ambulate from bed and care for self. Profound generalized weakness. Denies CP, SOB, palpitations, ABD pain, dysuria, Frequency, rash, n/v, HA, fever, Cough, hematochezia, melena. Still on Augmentin for Bronchitis that has essentially resolved per pt. Started on 01/04/21. Leg wounds (chronic) were wrapped by home health aid on 01/10/21 per pt. No change in medication recenlyt and endorses compliance.    PT Comments    Patient demonstrates slow labored movement for sitting up at bedside requiring Mod assist to move RLE and fair/good return for using bed rail to help pull self to sitting with HOB raised.  Patient had difficulty maintaining sitting balance when initially sitting up, but improved after a few minutes and able to keep trunk in midline while completing BLE exercises supporting self with BUE.  Patient limited for sitting up at bedside due to c/o fatigue demonstrates fair/good return for using BUE and LLE to help reposition self when put back to bed.  Patient will benefit from continued physical therapy in hospital and recommended venue below to increase strength, balance, endurance for safe ADLs and gait.     Follow Up Recommendations  SNF     Equipment Recommendations       Recommendations for Other Services       Precautions / Restrictions Precautions Precautions: Fall Restrictions Weight Bearing Restrictions: No    Mobility  Bed Mobility Overal bed mobility: Needs Assistance Bed Mobility: Supine to Sit;Sit to Supine      Supine to sit: Mod assist;HOB elevated Sit to supine: Mod assist   General bed mobility comments: slow labored movement requiring assistance to move RLE, HOB and use of bed rail    Transfers                    Ambulation/Gait                 Stairs             Wheelchair Mobility    Modified Rankin (Stroke Patients Only)       Balance Overall balance assessment: Needs assistance Sitting-balance support: Feet supported;No upper extremity supported Sitting balance-Leahy Scale: Fair Sitting balance - Comments: sitting at EOB                                    Cognition Arousal/Alertness: Awake/alert Behavior During Therapy: WFL for tasks assessed/performed Overall Cognitive Status: Within Functional Limits for tasks assessed                                        Exercises General Exercises - Lower Extremity Long Arc Quad: Seated;AROM;Strengthening;Both;10 reps Toe Raises: Seated;AROM;Strengthening;Left;10 reps Heel Raises: Seated;AROM;Strengthening;Both;10 reps    General Comments        Pertinent Vitals/Pain Pain Assessment: Faces Faces Pain Scale: Hurts little more Pain Location: RLE with pressure, movement Pain Descriptors / Indicators: Sore Pain Intervention(s): Limited  activity within patient's tolerance;Monitored during session;Repositioned    Home Living                      Prior Function            PT Goals (current goals can now be found in the care plan section) Acute Rehab PT Goals Patient Stated Goal: return home PT Goal Formulation: With patient Time For Goal Achievement: 01/27/21 Potential to Achieve Goals: Good Progress towards PT goals: Progressing toward goals    Frequency    Min 3X/week      PT Plan Current plan remains appropriate    Co-evaluation              AM-PAC PT "6 Clicks" Mobility   Outcome Measure  Help needed turning from your back to your  side while in a flat bed without using bedrails?: A Lot Help needed moving from lying on your back to sitting on the side of a flat bed without using bedrails?: A Lot Help needed moving to and from a bed to a chair (including a wheelchair)?: Total Help needed standing up from a chair using your arms (e.g., wheelchair or bedside chair)?: Total Help needed to walk in hospital room?: Total Help needed climbing 3-5 steps with a railing? : Total 6 Click Score: 8    End of Session Equipment Utilized During Treatment: Oxygen Activity Tolerance: Patient tolerated treatment well;Patient limited by fatigue Patient left: in bed;with call bell/phone within reach Nurse Communication: Mobility status PT Visit Diagnosis: Unsteadiness on feet (R26.81);Other abnormalities of gait and mobility (R26.89);Muscle weakness (generalized) (M62.81)     Time: 0109-3235 PT Time Calculation (min) (ACUTE ONLY): 20 min  Charges:  $Therapeutic Exercise: 8-22 mins $Therapeutic Activity: 8-22 mins                     2:00 PM, 01/21/21 Lonell Grandchild, MPT Physical Therapist with [facility] [phone] office [phone] mobile phone

## 2021-01-21 NOTE — Progress Notes (Addendum)
  [facility] Progress Note   Assessment/ Plan:    CKD V--> ESRD: started dialysis based on OP plan and uremic symptoms.  [place] 7/22 and HD #1 7/23, tx #2 7/25-  #3 7/27. CLIP- will be going to [facility]-  has MWF chair- next tx planned for today Symptomatic anemia: aranesp-  also giving iron. Transfuse for hgb <7  HTN: bp acceptable, uf as tolerated with HD DM II: per primary Bone/ mineral: Phos 5.6- renvela being given as corrected calcium is in the 9's-  no recent PTH-  will check one - pending  Dysuria, pyuria: afebrile, UA and ucx ordered Venous stasis ulcer: has unna boots Dispo-  deconditioned-  planning for snf?  Will likely not be seen over the weekend, please call with questions/concerns.   Subjective:    No acute events. Reports dysuria improved. Feels slightly better today. No complaints.   Objective:   BP 135/68 (BP Location: Left Arm)   Pulse 90   Temp 98.1 F (36.7 C) (Oral)   Resp 17   Ht 6\' 1"  (1.854 m)   Wt 129.8 kg   SpO2 98%   BMI 37.75 kg/m   Physical Exam: Gen- pale, very weak, NAD CV: s1s2, rrr Chest- normal wob Abd-  obese, soft, non tender Ext-  trace pitting edema bilateral LE's Neuro: awake, alert, speech clear and coherent HD access: [facility]   Labs: BMET Recent Labs  Lab 01/14/21 1552 01/15/21 1512 01/16/21 0506 01/17/21 1409 01/19/21 1705  NA 134* 136 134* 133* 134*  K 3.9 4.3 3.8 3.8 3.7  CL 103 102 100 100 99  CO2 25 26 27 25 29   GLUCOSE 116* 103* 99 111* 79  BUN 62* 67* 52* 62* 47*  CREATININE 4.78* 4.94* 3.74* 4.47* 3.55*  CALCIUM 8.0* 8.1* 8.1* 7.9* 7.9*  PHOS 5.0* 5.8* 4.5 5.6* 4.0   CBC Recent Labs  Lab 01/14/21 1552 01/15/21 1512 01/17/21 1409 01/19/21 1705  WBC 11.6* 9.3 7.0 7.2  HGB 8.7* 7.7* 7.7* 8.1*  HCT 27.4* 25.2* 25.0* 26.9*  MCV 87.3 89.0 89.0 89.7  PLT 184 176 205 228      Medications:     apixaban  5 mg Oral BID   aspirin  325 mg Oral Daily   Chlorhexidine Gluconate  Cloth  6 each Topical Q0600   Chlorhexidine Gluconate Cloth  6 each Topical Q0600   Chlorhexidine Gluconate Cloth  6 each Topical Q0600   cholecalciferol  1,000 Units Oral Daily   darbepoetin (ARANESP) injection - DIALYSIS  100 mcg Intravenous Q Mon-HD   feeding supplement (GLUCERNA SHAKE)  237 mL Oral BID BM   fluticasone  1 spray Each Nare Daily   insulin aspart  0-9 Units Subcutaneous TID WC   insulin aspart protamine- aspart  5 Units Subcutaneous BID WC   metoprolol succinate  25 mg Oral Q12H   nystatin   Topical TID   pravastatin  80 mg Oral QPM   sevelamer carbonate  800 mg Oral BID WC   vitamin B-12  1,000 mcg Oral Daily   vitamin E  800 Units Oral Daily   zinc sulfate  220 mg Oral Daily     Katerine Morua  01/21/2021, 10:46 AM

## 2021-01-22 DIAGNOSIS — N186 End stage renal disease: Secondary | ICD-10-CM | POA: Diagnosis not present

## 2021-01-22 DIAGNOSIS — Z7189 Other specified counseling: Secondary | ICD-10-CM | POA: Diagnosis not present

## 2021-01-22 DIAGNOSIS — G9341 Metabolic encephalopathy: Secondary | ICD-10-CM | POA: Diagnosis not present

## 2021-01-22 DIAGNOSIS — D649 Anemia, unspecified: Secondary | ICD-10-CM | POA: Diagnosis not present

## 2021-01-22 LAB — RENAL FUNCTION PANEL
Albumin: 2.4 g/dL — ABNORMAL LOW (ref 3.5–5.0)
Anion gap: 5 (ref 5–15)
BUN: 48 mg/dL — ABNORMAL HIGH (ref 8–23)
CO2: 31 mmol/L (ref 22–32)
Calcium: 8.1 mg/dL — ABNORMAL LOW (ref 8.9–10.3)
Chloride: 100 mmol/L (ref 98–111)
Creatinine, Ser: 3.43 mg/dL — ABNORMAL HIGH (ref 0.61–1.24)
GFR, Estimated: 19 mL/min — ABNORMAL LOW (ref >60)
Glucose, Bld: 129 mg/dL — ABNORMAL HIGH (ref 70–99)
Phosphorus: 3.7 mg/dL (ref 2.5–4.6)
Potassium: 3.8 mmol/L (ref 3.5–5.1)
Sodium: 136 mmol/L (ref 135–145)

## 2021-01-22 LAB — CBC
HCT: 28.1 % — ABNORMAL LOW (ref 39.0–52.0)
Hemoglobin: 8.7 g/dL — ABNORMAL LOW (ref 13.0–17.0)
MCH: 27.8 pg (ref 26.0–34.0)
MCHC: 31 g/dL (ref 30.0–36.0)
MCV: 89.8 fL (ref 80.0–100.0)
Platelets: 266 10*3/uL (ref 150–400)
RBC: 3.13 MIL/uL — ABNORMAL LOW (ref 4.22–5.81)
RDW: 19.2 % — ABNORMAL HIGH (ref 11.5–15.5)
WBC: 7.4 10*3/uL (ref 4.0–10.5)
nRBC: 0 % (ref 0.0–0.2)

## 2021-01-22 LAB — GLUCOSE, CAPILLARY
Glucose-Capillary: 108 mg/dL — ABNORMAL HIGH (ref 70–99)
Glucose-Capillary: 100 mg/dL — ABNORMAL HIGH (ref 70–99)
Glucose-Capillary: 89 mg/dL (ref 70–99)
Glucose-Capillary: 123 mg/dL — ABNORMAL HIGH (ref 70–99)

## 2021-01-22 LAB — PTH, INTACT AND CALCIUM
Calcium, Total (PTH): 8.6 mg/dL (ref 8.6–10.2)
PTH: 42 pg/mL (ref 15–65)

## 2021-01-22 MED ORDER — POLYETHYLENE GLYCOL 3350 17 G PO PACK
17.0000 g | PACK | Freq: Every day | ORAL | Status: DC
  Administered 2021-01-22 – 2021-01-27 (×5): 17 g via ORAL
  Filled 2021-01-22 (×7): qty 1

## 2021-01-22 MED ORDER — ALBUMIN HUMAN 25 % IV SOLN
INTRAVENOUS | Status: AC
  Administered 2021-01-22: 25 g
  Filled 2021-01-22: qty 100

## 2021-01-22 NOTE — TOC Progression Note (Signed)
Transition of Care [facility]) - Progression Note    Patient Details  Name: Danny [name] MRN: [id number] Date of Birth: 1955-12-13  Transition of Care [facility]) CM/SW Contact  Natasha Bence, LCSW Phone Number: 01/22/2021, 3:18 PM  Clinical Narrative:    CSW contacted Debbie with [facility] to inquire about Debbie's agreeableness to take patient. Debbie reported that they are now able to provide transportation to dialysis TTS, but [name] reported that they no longer have male bed availability and will not be able to take patient until Monday. Josem Kaufmann has been extended. TOC to follow.   Expected Discharge Plan: Skilled Nursing Facility Barriers to Discharge: Continued Medical Work up  Expected Discharge Plan and Services Expected Discharge Plan: [place]       Living arrangements for the past 2 months: Apartment                                       Social Determinants of Health (SDOH) Interventions    Readmission Risk Interventions Readmission Risk Prevention Plan 01/13/2021 11/05/2020 03/24/2020  Transportation Screening Complete - Complete  PCP or Specialist Appt within 3-5 Days - Complete Not Complete  Not Complete comments - - -  [place]RI or [place] - - Complete  Social Work Consult for [place] Planning/Counseling - - Complete  Palliative Care Screening - - Not Applicable  Medication Review [employer] photographer) Complete - Complete  PCP or Specialist appointment within 3-5 days of discharge - - -  [place] or Home Care Consult Complete - -  SW Recovery Care/Counseling Consult Complete - -  Palliative Care Screening Not Complete - -  Skilled Nursing Facility Complete - -  Some recent data might be hidden

## 2021-01-22 NOTE — Procedures (Addendum)
   HEMODIALYSIS TREATMENT NOTE:  Pt has been assigned a TTS outpatient HD schedule with [facility] therefore  HD was rescheduled from Friday to Saturday to start this rotation.   3.5 hour heparin-free HD completed via right IJ TDC.  Catheter dressing was changed; entry site is unremarkable.  Goal met:  3 liters removed with administration of Albumin 25g x1.  All blood was returned.  Rockwell Alexandria, RN

## 2021-01-22 NOTE — Progress Notes (Addendum)
PROGRESS NOTE  Danny [name] RFF:[id number] DOB: April 22, 1956 DOA: 01/12/2021 PCP: Celene Squibb, MD   Brief History:  As per H&P written by Dr. Marily Memos on 01/12/2021 Danny [name] is a 65 y.o. male with medical history significant of Anemia of Chronic disease, CKD stage 5, DM, Chronic LE ulcers from venous stasis dermatitis and DM, HTN, Urinary retention. Pt states he has been an overall physical decline for several days w/o acute complaint. On day of admission pt unable to ambulate from bed and care for self. Profound generalized weakness. Denies CP, SOB, palpitations, ABD pain, dysuria, Frequency, rash, n/v, HA, fever, Cough, hematochezia, melena. Still on Augmentin for Bronchitis that has essentially resolved per pt. Started on 01/04/21. Leg wounds (chronic) were wrapped by home health aid on 01/10/21 per pt. No change in medication recenlyt and endorses compliance.    ED Course: Objective findings below. Started on 2 units PRBC infusion   Assessment/Plan: stage V CKD>>New ESRD -General surgery has placed dialysis catheter 7/22; -hemodialysis treatment initiation on 01/15/2021. -Second dialysis treatment well-tolerated on 01/17/2021; per nephrology recommendations   -third dialysis treatment on 01/19/2021 -had HD on 7/30 -Continue to follow renal diet, strict I's and O's and daily weight. -Continue to follow nephrology service recommendations for next dialysis management   Acute Respiratory Failure with Hypoxia -due to fluid overload -stable on 2L -wean to RA for saturatino > 35%   Acute Metabolic Encephalopathy -pt has had cognitive and functional decline over last 6-12 months -multifactorial including renal failure, anemia, UTI -UA >50 WBC -check ammonia--25 -check VBG--7.405/48/54 on 2L -B12--3890 -TSH--2.433 -continue empiric ceftriaxone pending cultures -mental status improving on ceftriaxone  Acute UTI -7/28 UA >50WBC -7/28 urine culture Morganella -continue ceftriaxone  pending cultures    Symptomatic anemia -And with underlying history of a stage V renal failure and anemia of chronic kidney disease. -Hemoglobin down to 7.0 causing symptomatic anemia. -2 units PRBCs have been given; posttransfusion Hgb remains largely stable -Hemoglobin has remained stable post transfusion -Will continue to follow hemoglobin trend intermittently. -Transfusion threshold is for hemoglobin less than 7. -IV iron and Epogen therapy as per renal function discretion. -No overt bleeding appreciated.   Diabetes mellitus type 2 -follow CBG's and further adjust hypoglycemic regimen accordingly. -continue SSI -decreased 70/30 -7/24 A1C--6.8   Chronic diastolic CHF -Low-sodium diet encouraged. -Patient in need of dialysis initiation -Volume to be managed with dialysis treatment. -Continue to follow daily weights and strict I's and O's.   secondary hyperparathyroidism -continue the use of Renvela - follow nephrology further recommendations -Follow electrolytes stability.     essential hypertension -BP is soft but improving -HD treatment helping volume and BP   hyperlipidemia -Continue statins. -Heart healthy diet encouraged.   Venous stasis ulcers -has Unna boots -appreciate wound care recommendations>>> Daily cleanse with NS, pat dry. Cover wound with silver hydrofiber (Aquacel Ag+ Advantage), top with dry dressing, ABD and secure with Kerlix/paper tape.  Place foot into [employer].  May moisten with NS, as needed for removal   Atrial Fibrillation, type unspecified --5/7 Echo EF 55-60%; mild decrease RV fxn, RVSP 61.5 -continue metoprolol succinate -continue apixaban   Goals of Care -long discussion with sister and brother 7/28 -Advance care planning, including the explanation and discussion of advance directives was carried out with the patient and family.  Code status including explanations of "Full Code" and "DNR" and alternatives were discussed in detail.   Discussion of end-of-life issues including  but not limited palliative care, hospice care and the concept of hospice, other end-of-life care options, power of attorney for health care decisions, living wills, and physician orders for life-sustaining treatment were also discussed with the patient and family.  Total face to face time 20 minutes. -change to DNR -if no change cognitively and functionally in next 2-3 weeks with HD and STR, then would consider residential hospice       Status is: Inpatient   Remains inpatient appropriate because:IV treatments appropriate due to intensity of illness or inability to take PO   Dispo: The patient is from: Home              Anticipated d/c is to: SNF              Patient currently is not medically stable to d/c.              Difficult to place patient No               Family Communication: brother and sister updated 7/30   Consultants:  renal   Code Status:  FULL   DVT Prophylaxis:  apixaban     Procedures: As Listed in Progress Note Above   Antibiotics: Ceftriaxone 7/28>>     Subjective: Patient denies fevers, chills, headache, chest pain, dyspnea, nausea, vomiting, diarrhea, abdominal pain, dysuria, hematuria, hematochezia, and melena.   Objective: Vitals:   01/22/21 1400 01/22/21 1430 01/22/21 1445 01/22/21 1500  BP: 121/68 127/77 130/74 135/77  Pulse: 98 (!) 106 (!) 101 100  Resp:    18  Temp:    98.1 F (36.7 C)  TempSrc:    Oral  SpO2:    100%  Weight:      Height:        Intake/Output Summary (Last 24 hours) at 01/22/2021 1727 Last data filed at 01/22/2021 1514 Gross per 24 hour  Intake 935 ml  Output 3808 ml  Net -2873 ml   Weight change:  Exam:  General:  Pt is alert, follows commands appropriately, not in acute distress HEENT: No icterus, No thrush, No neck mass, [place]/AT Cardiovascular: RRR, S1/S2, no rubs, no gallops Respiratory: bibasilar crackles. No wheeze Abdomen: Soft/+BS, non tender, non  distended, no guarding Extremities: 2 + LE edema, No lymphangitis, No petechiae, No rashes, no synovitis   Data Reviewed: I have personally reviewed following labs and imaging studies Basic Metabolic Panel: Recent Labs  Lab 01/16/21 0506 01/17/21 1409 01/19/21 1705 01/21/21 0630 01/22/21 1122  NA 134* 133* 134*  --  136  K 3.8 3.8 3.7  --  3.8  CL 100 100 99  --  100  CO2 27 25 29   --  31  GLUCOSE 99 111* 79  --  129*  BUN 52* 62* 47*  --  48*  CREATININE 3.74* 4.47* 3.55*  --  3.43*  CALCIUM 8.1* 7.9* 7.9* 8.6 8.1*  PHOS 4.5 5.6* 4.0  --  3.7   Liver Function Tests: Recent Labs  Lab 01/16/21 0506 01/17/21 1409 01/19/21 1705 01/22/21 1122  ALBUMIN 2.3* 2.3* 2.4* 2.4*   No results for input(s): LIPASE, AMYLASE in the last 168 hours. Recent Labs  Lab 01/20/21 1535  AMMONIA 25   Coagulation Profile: No results for input(s): INR, PROTIME in the last 168 hours. CBC: Recent Labs  Lab 01/17/21 1409 01/19/21 1705 01/22/21 1122  WBC 7.0 7.2 7.4  HGB 7.7* 8.1* 8.7*  HCT 25.0* 26.9* 28.1*  MCV 89.0 89.7 89.8  PLT 205 228 266   Cardiac Enzymes: No results for input(s): CKTOTAL, CKMB, CKMBINDEX, TROPONINI in the last 168 hours. BNP: Invalid input(s): POCBNP CBG: Recent Labs  Lab 01/21/21 1613 01/21/21 2104 01/22/21 0748 01/22/21 1357 01/22/21 1619  GLUCAP 126* 90 108* 100* 89   HbA1C: No results for input(s): HGBA1C in the last 72 hours. Urine analysis:    Component Value Date/Time   COLORURINE AMBER (A) 01/20/2021 1114   APPEARANCEUR TURBID (A) 01/20/2021 1114   APPEARANCEUR Clear 09/10/2020 1122   LABSPEC 1.007 01/20/2021 1114   PHURINE 9.0 (H) 01/20/2021 1114   GLUCOSEU NEGATIVE 01/20/2021 1114   HGBUR SMALL (A) 01/20/2021 1114   BILIRUBINUR NEGATIVE 01/20/2021 1114   BILIRUBINUR Negative 09/10/2020 1122   KETONESUR NEGATIVE 01/20/2021 1114   PROTEINUR >=300 (A) 01/20/2021 1114   NITRITE NEGATIVE 01/20/2021 1114   LEUKOCYTESUR LARGE (A)  01/20/2021 1114   Sepsis Labs: @LABRCNTIP (procalcitonin:4,lacticidven:4) ) Recent Results (from the past 240 hour(s))  Urine Culture     Status: Abnormal (Preliminary result)   Collection Time: 01/20/21 11:14 AM   Specimen: Urine, Clean Catch  Result Value Ref Range Status   Specimen Description   Final    URINE, CLEAN CATCH Performed at [facility], [address], [place], [place] [address]    Special Requests   Final    NONE Performed at [facility], [address] [place], [place] [address]    Culture (A)  Final    >=100,000 COLONIES/mL MORGANELLA MORGANII SUSCEPTIBILITIES TO FOLLOW Performed at [facility], [place] [address] [place], [place] [address]    Report Status PENDING  Incomplete     Scheduled Meds:  apixaban  5 mg Oral BID   Chlorhexidine Gluconate Cloth  6 each Topical Q0600   cholecalciferol  1,000 Units Oral Daily   darbepoetin (ARANESP) injection - DIALYSIS  100 mcg Intravenous Q Mon-HD   feeding supplement (GLUCERNA SHAKE)  237 mL Oral BID BM   fluticasone  1 spray Each Nare Daily   insulin aspart  0-9 Units Subcutaneous TID WC   insulin aspart protamine- aspart  5 Units Subcutaneous BID WC   metoprolol succinate  25 mg Oral Q12H   nystatin   Topical TID   polyethylene glycol  17 g Oral Daily   pravastatin  80 mg Oral QPM   sevelamer carbonate  800 mg Oral BID WC   vitamin B-12  1,000 mcg Oral Daily   vitamin E  800 Units Oral Daily   zinc sulfate  220 mg Oral Daily   Continuous Infusions:  sodium chloride     sodium chloride     cefTRIAXone (ROCEPHIN)  IV Stopped (01/22/21 1430)   methocarbamol (ROBAXIN) IV      Procedures/Studies: DG Chest Port 1 View  Result Date: 01/14/2021 CLINICAL DATA:  Vascular dialysis catheter in place. EXAM: PORTABLE CHEST 1 VIEW COMPARISON:  Radiograph 01/12/2021. FINDINGS: New right internal jugular central venous catheter tip overlies the lower SVC. No pneumothorax. Stable cardiomegaly. Slight worsening  right basilar aeration and blunting of the costophrenic angle, suspicious for increasing pleural effusion and atelectasis. Vascular congestion. IMPRESSION: 1. New right internal jugular central venous catheter with tip overlying the lower SVC. No pneumothorax. 2. Slight worsening right basilar aeration and blunting of the costophrenic angle, suspicious for increasing pleural effusion and atelectasis. Unchanged cardiomegaly. Electronically Signed   By: Keith Rake M.D.   On: 01/14/2021 15:06   DG Chest Portable 1 View  Result Date: 01/12/2021  CLINICAL DATA:  Weakness. EXAM: PORTABLE CHEST 1 VIEW COMPARISON:  January 03, 2021. FINDINGS: Stable cardiomegaly. No pneumothorax or pleural effusion is noted. Stable bibasilar subsegmental atelectasis. Bony thorax is unremarkable. IMPRESSION: Stable bibasilar subsegmental atelectasis. Electronically Signed   By: Marijo Conception M.D.   On: 01/12/2021 12:11   DG Chest Port 1 View  Result Date: 01/03/2021 CLINICAL DATA:  Shortness of breath EXAM: PORTABLE CHEST 1 VIEW COMPARISON:  November 29, 2020 FINDINGS: Stable cardiomegaly. Aortic atherosclerosis. Bibasilar opacities, favor atelectasis. No visible pleural effusion or pneumothorax. The visualized skeletal structures are unchanged. IMPRESSION: 1. Stable cardiomegaly. 2. Bibasilar opacities, favor atelectasis. 3.  Aortic Atherosclerosis (ICD10-I70.0). Electronically Signed   By: Dahlia Bailiff MD   On: 01/03/2021 18:29   DG C-Arm 1-60 Min-No Report  Result Date: 01/14/2021 Fluoroscopy was utilized by the requesting physician.  No radiographic interpretation.    Orson Eva, DO  [facility]  If 7PM-7AM, please contact night-coverage [email or web address] Password [email or web address] 01/22/2021, 5:27 PM   LOS: 10 days

## 2021-01-23 DIAGNOSIS — D649 Anemia, unspecified: Secondary | ICD-10-CM | POA: Diagnosis not present

## 2021-01-23 DIAGNOSIS — I5032 Chronic diastolic (congestive) heart failure: Secondary | ICD-10-CM | POA: Diagnosis not present

## 2021-01-23 DIAGNOSIS — G9341 Metabolic encephalopathy: Secondary | ICD-10-CM | POA: Diagnosis not present

## 2021-01-23 DIAGNOSIS — N186 End stage renal disease: Secondary | ICD-10-CM | POA: Diagnosis not present

## 2021-01-23 LAB — GLUCOSE, CAPILLARY
Glucose-Capillary: 118 mg/dL — ABNORMAL HIGH (ref 70–99)
Glucose-Capillary: 137 mg/dL — ABNORMAL HIGH (ref 70–99)
Glucose-Capillary: 113 mg/dL — ABNORMAL HIGH (ref 70–99)
Glucose-Capillary: 145 mg/dL — ABNORMAL HIGH (ref 70–99)

## 2021-01-23 LAB — URINE CULTURE: Culture: >=100000 — AB

## 2021-01-23 MED ORDER — SODIUM CHLORIDE 0.9 % IV SOLN
2.0000 g | INTRAVENOUS | Status: DC
  Administered 2021-01-23 – 2021-01-25 (×3): 2 g via INTRAVENOUS
  Filled 2021-01-23 (×3): qty 2

## 2021-01-23 NOTE — Progress Notes (Signed)
PROGRESS NOTE  Danny [name] XBD:[id number] DOB: [birth year] DOA: 01/12/2021 PCP: Celene Squibb, MD  Brief History:  As per H&P written by Dr. Marily Memos on 01/12/2021 Danny [name] is a 65 y.o. male with medical history significant of Anemia of Chronic disease, CKD stage 5, DM, Chronic LE ulcers from venous stasis dermatitis and DM, HTN, Urinary retention. Pt states he has been an overall physical decline for several days w/o acute complaint. On day of admission pt unable to ambulate from bed and care for self. Profound generalized weakness. Denies CP, SOB, palpitations, ABD pain, dysuria, Frequency, rash, n/v, HA, fever, Cough, hematochezia, melena. Still on Augmentin for Bronchitis that has essentially resolved per pt. Started on 01/04/21. Leg wounds (chronic) were wrapped by home health aid on 01/10/21 per pt. No change in medication recenlyt and endorses compliance.    ED Course: Objective findings below. Started on 2 units PRBC infusion   Assessment/Plan: stage V CKD>>New ESRD -General surgery has placed dialysis catheter 7/22; -hemodialysis treatment initiation on 01/15/2021. -Second dialysis treatment well-tolerated on 01/17/2021 -third dialysis treatment on 01/19/2021 -had HD on 7/30 -Continue to follow renal diet, strict I's and O's and daily weight. -Continue to follow nephrology service recommendations for next dialysis management   Acute Respiratory Failure with Hypoxia -due to fluid overload -stable on 2L -wean to RA for saturatino > 83%   Acute Metabolic Encephalopathy -pt has had cognitive and functional decline over last 6-12 months -multifactorial including renal failure, anemia, UTI -UA >50 WBC -check ammonia--25 -check VBG--7.405/48/54 on 2L -B12--3890 -TSH--2.433 -started empiric ceftriaxone pending cultures>>cefepime -mental status improving   Acute UTI -7/28 UA >50WBC -7/28 urine culture Morganella -discontinue ceftriaxone -start cefepime   Symptomatic  anemia -And with underlying history of a stage V renal failure and anemia of chronic kidney disease. -Hemoglobin down to 7.0 causing symptomatic anemia. -2 units PRBCs have been given; posttransfusion Hgb remains largely stable -Hemoglobin has remained stable post transfusion -Will continue to follow hemoglobin trend intermittently. -Transfusion threshold is for hemoglobin less than 7. -IV iron and Epogen therapy as per renal function discretion. -No overt bleeding appreciated.   Diabetes mellitus type 2 -follow CBG's and further adjust hypoglycemic regimen accordingly. -continue SSI -decreased 70/30 -7/24 A1C--6.8   Chronic diastolic CHF -Low-sodium diet encouraged. -Patient in need of dialysis initiation -Volume to be managed with dialysis treatment. -Continue to follow daily weights and strict I's and O's.   secondary hyperparathyroidism -continue the use of Renvela - follow nephrology further recommendations -Follow electrolytes stability.     essential hypertension -BP is soft but improving -HD treatment helping volume and BP   hyperlipidemia -Continue statins. -Heart healthy diet encouraged.   Venous stasis ulcers -has Unna boots -appreciate wound care recommendations>>> Daily cleanse with NS, pat dry. Cover wound with silver hydrofiber (Aquacel Ag+ Advantage), top with dry dressing, ABD and secure with Kerlix/paper tape.  Place foot into [employer].  May moisten with NS, as needed for removal   Atrial Fibrillation, type unspecified --5/7 Echo EF 55-60%; mild decrease RV fxn, RVSP 61.5 -continue metoprolol succinate -continue apixaban   Goals of Care -long discussion with sister and brother 7/28 -Advance care planning, including the explanation and discussion of advance directives was carried out with the patient and family.  Code status including explanations of "Full Code" and "DNR" and alternatives were discussed in detail.  Discussion of end-of-life issues  including but not limited palliative care, hospice care and  the concept of hospice, other end-of-life care options, power of attorney for health care decisions, living wills, and physician orders for life-sustaining treatment were also discussed with the patient and family.  Total face to face time 20 minutes. -change to DNR -if no change cognitively and functionally in next 2-3 weeks with HD and STR, then would consider residential hospice       Status is: Inpatient   Remains inpatient appropriate because:IV treatments appropriate due to intensity of illness or inability to take PO   Dispo: The patient is from: Home              Anticipated d/c is to: SNF              Patient currently is not medically stable to d/c.              Difficult to place patient No               Family Communication: brother and sister updated 7/30   Consultants:  renal   Code Status:  FULL   DVT Prophylaxis:  apixaban     Procedures: As Listed in Progress Note Above   Antibiotics: Ceftriaxone 7/28>>7/30 Cefepime 7/31>>   Subjective: Patient denies fevers, chills, headache, chest pain, dyspnea, nausea, vomiting, diarrhea, abdominal pain, dysuria, hematuria, hematochezia, and melena.   Objective: Vitals:   01/22/21 2042 01/23/21 0500 01/23/21 0646 01/23/21 1333  BP: 133/80  126/76 (!) 143/82  Pulse: 88  97 (!) 114  Resp: 18  16 20   Temp: (!) 97.4 F (36.3 C)  98.4 F (36.9 C) 98.2 F (36.8 C)  TempSrc: Oral  Oral Oral  SpO2: 99%  99% 98%  Weight:  124.5 kg    Height:        Intake/Output Summary (Last 24 hours) at 01/23/2021 1708 Last data filed at 01/23/2021 1600 Gross per 24 hour  Intake 1640 ml  Output 950 ml  Net 690 ml   Weight change:  Exam:  General:  Pt is alert, follows commands appropriately, not in acute distress HEENT: No icterus, No thrush, No neck mass, [place]/AT Cardiovascular: RRR, S1/S2, no rubs, no gallops Respiratory: bibasilar crackles. No wheeze Abdomen:  Soft/+BS, non tender, non distended, no guarding Extremities: 2 + LE edema, No lymphangitis, No petechiae, No rashes, no synovitis   Data Reviewed: I have personally reviewed following labs and imaging studies Basic Metabolic Panel: Recent Labs  Lab 01/17/21 1409 01/19/21 1705 01/21/21 0630 01/22/21 1122  NA 133* 134*  --  136  K 3.8 3.7  --  3.8  CL 100 99  --  100  CO2 25 29  --  31  GLUCOSE 111* 79  --  129*  BUN 62* 47*  --  48*  CREATININE 4.47* 3.55*  --  3.43*  CALCIUM 7.9* 7.9* 8.6 8.1*  PHOS 5.6* 4.0  --  3.7   Liver Function Tests: Recent Labs  Lab 01/17/21 1409 01/19/21 1705 01/22/21 1122  ALBUMIN 2.3* 2.4* 2.4*   No results for input(s): LIPASE, AMYLASE in the last 168 hours. Recent Labs  Lab 01/20/21 1535  AMMONIA 25   Coagulation Profile: No results for input(s): INR, PROTIME in the last 168 hours. CBC: Recent Labs  Lab 01/17/21 1409 01/19/21 1705 01/22/21 1122  WBC 7.0 7.2 7.4  HGB 7.7* 8.1* 8.7*  HCT 25.0* 26.9* 28.1*  MCV 89.0 89.7 89.8  PLT 205 228 266   Cardiac Enzymes: No results for input(s): CKTOTAL,  CKMB, CKMBINDEX, TROPONINI in the last 168 hours. BNP: Invalid input(s): POCBNP CBG: Recent Labs  Lab 01/22/21 1619 01/22/21 2048 01/23/21 0733 01/23/21 1101 01/23/21 1630  GLUCAP 89 123* 118* 137* 113*   HbA1C: No results for input(s): HGBA1C in the last 72 hours. Urine analysis:    Component Value Date/Time   COLORURINE AMBER (A) 01/20/2021 1114   APPEARANCEUR TURBID (A) 01/20/2021 1114   APPEARANCEUR Clear 09/10/2020 1122   LABSPEC 1.007 01/20/2021 1114   PHURINE 9.0 (H) 01/20/2021 1114   GLUCOSEU NEGATIVE 01/20/2021 1114   HGBUR SMALL (A) 01/20/2021 1114   BILIRUBINUR NEGATIVE 01/20/2021 1114   BILIRUBINUR Negative 09/10/2020 1122   KETONESUR NEGATIVE 01/20/2021 1114   PROTEINUR >=300 (A) 01/20/2021 1114   NITRITE NEGATIVE 01/20/2021 1114   LEUKOCYTESUR LARGE (A) 01/20/2021 1114   Sepsis  Labs: @LABRCNTIP (procalcitonin:4,lacticidven:4) ) Recent Results (from the past 240 hour(s))  Urine Culture     Status: Abnormal   Collection Time: 01/20/21 11:14 AM   Specimen: Urine, Clean Catch  Result Value Ref Range Status   Specimen Description   Final    URINE, CLEAN CATCH Performed at [facility], [address], [place], [place] [address]    Special Requests   Final    NONE Performed at [facility], [address] [place], [place] [address]    Culture >=100,000 COLONIES/mL [facility] MORGANII (A)  Final   Report Status 01/23/2021 FINAL  Final   Organism ID, Bacteria MORGANELLA MORGANII (A)  Final      Susceptibility   Morganella morganii - MIC*    AMPICILLIN >=32 RESISTANT Resistant     CEFAZOLIN >=64 RESISTANT Resistant     CIPROFLOXACIN <=0.25 SENSITIVE Sensitive     GENTAMICIN <=1 SENSITIVE Sensitive     IMIPENEM 1 SENSITIVE Sensitive     NITROFURANTOIN RESISTANT Resistant     TRIMETH/SULFA <=20 SENSITIVE Sensitive     AMPICILLIN/SULBACTAM >=32 RESISTANT Resistant     PIP/TAZO <=4 SENSITIVE Sensitive     * >=100,000 COLONIES/mL MORGANELLA MORGANII     Scheduled Meds:  apixaban  5 mg Oral BID   Chlorhexidine Gluconate Cloth  6 each Topical Q0600   cholecalciferol  1,000 Units Oral Daily   darbepoetin (ARANESP) injection - DIALYSIS  100 mcg Intravenous Q Mon-HD   feeding supplement (GLUCERNA SHAKE)  237 mL Oral BID BM   fluticasone  1 spray Each Nare Daily   insulin aspart  0-9 Units Subcutaneous TID WC   insulin aspart protamine- aspart  5 Units Subcutaneous BID WC   metoprolol succinate  25 mg Oral Q12H   nystatin   Topical TID   polyethylene glycol  17 g Oral Daily   pravastatin  80 mg Oral QPM   sevelamer carbonate  800 mg Oral BID WC   vitamin B-12  1,000 mcg Oral Daily   vitamin E  800 Units Oral Daily   zinc sulfate  220 mg Oral Daily   Continuous Infusions:  sodium chloride     sodium chloride     ceFEPime (MAXIPIME) IV     methocarbamol  (ROBAXIN) IV      Procedures/Studies: DG Chest Port 1 View  Result Date: 01/14/2021 CLINICAL DATA:  Vascular dialysis catheter in place. EXAM: PORTABLE CHEST 1 VIEW COMPARISON:  Radiograph 01/12/2021. FINDINGS: New right internal jugular central venous catheter tip overlies the lower SVC. No pneumothorax. Stable cardiomegaly. Slight worsening right basilar aeration and blunting of the costophrenic angle, suspicious for increasing pleural effusion and atelectasis.  Vascular congestion. IMPRESSION: 1. New right internal jugular central venous catheter with tip overlying the lower SVC. No pneumothorax. 2. Slight worsening right basilar aeration and blunting of the costophrenic angle, suspicious for increasing pleural effusion and atelectasis. Unchanged cardiomegaly. Electronically Signed   By: Keith Rake M.D.   On: 01/14/2021 15:06   DG Chest Portable 1 View  Result Date: 01/12/2021 CLINICAL DATA:  Weakness. EXAM: PORTABLE CHEST 1 VIEW COMPARISON:  January 03, 2021. FINDINGS: Stable cardiomegaly. No pneumothorax or pleural effusion is noted. Stable bibasilar subsegmental atelectasis. Bony thorax is unremarkable. IMPRESSION: Stable bibasilar subsegmental atelectasis. Electronically Signed   By: Marijo Conception M.D.   On: 01/12/2021 12:11   DG Chest Port 1 View  Result Date: 01/03/2021 CLINICAL DATA:  Shortness of breath EXAM: PORTABLE CHEST 1 VIEW COMPARISON:  November 29, 2020 FINDINGS: Stable cardiomegaly. Aortic atherosclerosis. Bibasilar opacities, favor atelectasis. No visible pleural effusion or pneumothorax. The visualized skeletal structures are unchanged. IMPRESSION: 1. Stable cardiomegaly. 2. Bibasilar opacities, favor atelectasis. 3.  Aortic Atherosclerosis (ICD10-I70.0). Electronically Signed   By: Dahlia Bailiff MD   On: 01/03/2021 18:29   DG C-Arm 1-60 Min-No Report  Result Date: 01/14/2021 Fluoroscopy was utilized by the requesting physician.  No radiographic interpretation.    Orson Eva,  DO  [facility]  If 7PM-7AM, please contact night-coverage [email or web address] Password [email or web address] 01/23/2021, 5:08 PM   LOS: 11 days

## 2021-01-24 ENCOUNTER — Other Ambulatory Visit (HOSPITAL_COMMUNITY): Payer: Medicare HMO | Admitting: General Practice

## 2021-01-24 ENCOUNTER — Encounter (HOSPITAL_COMMUNITY): Payer: Self-pay | Admitting: General Practice

## 2021-01-24 DIAGNOSIS — G9341 Metabolic encephalopathy: Secondary | ICD-10-CM | POA: Diagnosis not present

## 2021-01-24 DIAGNOSIS — D649 Anemia, unspecified: Secondary | ICD-10-CM | POA: Diagnosis not present

## 2021-01-24 DIAGNOSIS — N186 End stage renal disease: Secondary | ICD-10-CM | POA: Diagnosis not present

## 2021-01-24 LAB — COMPREHENSIVE METABOLIC PANEL
ALT: 18 U/L (ref 0–44)
AST: 36 U/L (ref 15–41)
Albumin: 2.5 g/dL — ABNORMAL LOW (ref 3.5–5.0)
Alkaline Phosphatase: 242 U/L — ABNORMAL HIGH (ref 38–126)
Anion gap: 6 (ref 5–15)
BUN: 44 mg/dL — ABNORMAL HIGH (ref 8–23)
CO2: 27 mmol/L (ref 22–32)
Calcium: 8.1 mg/dL — ABNORMAL LOW (ref 8.9–10.3)
Chloride: 99 mmol/L (ref 98–111)
Creatinine, Ser: 3.24 mg/dL — ABNORMAL HIGH (ref 0.61–1.24)
GFR, Estimated: 20 mL/min — ABNORMAL LOW (ref >60)
Glucose, Bld: 115 mg/dL — ABNORMAL HIGH (ref 70–99)
Potassium: 4.2 mmol/L (ref 3.5–5.1)
Sodium: 132 mmol/L — ABNORMAL LOW (ref 135–145)
Total Bilirubin: 1 mg/dL (ref 0.3–1.2)
Total Protein: 6.5 g/dL (ref 6.5–8.1)

## 2021-01-24 LAB — CBC
HCT: 26.9 % — ABNORMAL LOW (ref 39.0–52.0)
Hemoglobin: 8.3 g/dL — ABNORMAL LOW (ref 13.0–17.0)
MCH: 27.6 pg (ref 26.0–34.0)
MCHC: 30.9 g/dL (ref 30.0–36.0)
MCV: 89.4 fL (ref 80.0–100.0)
Platelets: 231 10*3/uL (ref 150–400)
RBC: 3.01 MIL/uL — ABNORMAL LOW (ref 4.22–5.81)
RDW: 18.6 % — ABNORMAL HIGH (ref 11.5–15.5)
WBC: 8.1 10*3/uL (ref 4.0–10.5)
nRBC: 0 % (ref 0.0–0.2)

## 2021-01-24 LAB — PHOSPHORUS: Phosphorus: 3.5 mg/dL (ref 2.5–4.6)

## 2021-01-24 LAB — GLUCOSE, CAPILLARY
Glucose-Capillary: 134 mg/dL — ABNORMAL HIGH (ref 70–99)
Glucose-Capillary: 133 mg/dL — ABNORMAL HIGH (ref 70–99)
Glucose-Capillary: 101 mg/dL — ABNORMAL HIGH (ref 70–99)
Glucose-Capillary: 124 mg/dL — ABNORMAL HIGH (ref 70–99)
Glucose-Capillary: 122 mg/dL — ABNORMAL HIGH (ref 70–99)

## 2021-01-24 LAB — MAGNESIUM: Magnesium: 2.1 mg/dL (ref 1.7–2.4)

## 2021-01-24 MED ORDER — DARBEPOETIN ALFA 100 MCG/0.5ML IJ SOSY
100.0000 ug | PREFILLED_SYRINGE | INTRAMUSCULAR | Status: DC
  Administered 2021-01-25: 100 ug via INTRAVENOUS
  Filled 2021-01-24: qty 0.5

## 2021-01-24 NOTE — Progress Notes (Signed)
[facility] CSW Progress Notes  Spoke w patient - he is currently hospitalized after being admitted to [facility] from home.  He has started dialysis, "it has been rough."  States plan is for SNF placement at discharge, is awaiting insurance authorization for placement.  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  Has enjoyed visits from sister, brother, nieces and nephews.  Edwyna Shell, LCSW Clinical Social Worker Phone:  [phone]

## 2021-01-24 NOTE — Progress Notes (Signed)
Patient pleasant, vitals stable. Tolerated morning meds with no difficulty. Bilat lower ext reddened. Patient is alert, oriented. Joking with staff.

## 2021-01-24 NOTE — Progress Notes (Signed)
Patient upon change of shift presented very differently than he had 30 mins ago. He could not hold eyes open, very pale. Took a set of vitals and blood glucose, 116 hr, 124/78 bp, resp 17, o2 on 2l 100%, 98.6, bg 124. Notified dr Garth Schlatter.

## 2021-01-24 NOTE — Care Management Important Message (Signed)
Important Message  Patient Details  Name: Danny [name] MRN: [id number] Date of Birth: 25-Mar-1956   Medicare Important Message Given:  Yes     [name] 01/24/2021, 12:19 PM

## 2021-01-24 NOTE — Progress Notes (Addendum)
Patient ID: Danny [name], male   DOB: [birth year], 65 y.o.   MRN: [id number] S: No new complaints. O:BP (!) 118/52 (BP Location: Left Arm)   Pulse (!) 56   Temp 97.9 F (36.6 C) (Oral)   Resp 20   Ht 6\' 1"  (1.854 m)   Wt 127.2 kg   SpO2 98%   BMI 37.00 kg/m   Intake/Output Summary (Last 24 hours) at 01/24/2021 0939 Last data filed at 01/24/2021 0658 Gross per 24 hour  Intake 480 ml  Output 900 ml  Net -420 ml   Intake/Output: I/O last 3 completed shifts: In: 1400 [P.O.:1400] Out: 1150 [Urine:1150]  Intake/Output this shift:  No intake/output data recorded. Weight change: 2.1 kg Gen:NAD CVS: RRR Resp: CTA Abd: +BS, soft, NT/ND Ext: 1+ pretibial edema bilaterally  Recent Labs  Lab 01/17/21 1409 01/19/21 1705 01/21/21 0630 01/22/21 1122  NA 133* 134*  --  136  K 3.8 3.7  --  3.8  CL 100 99  --  100  CO2 25 29  --  31  GLUCOSE 111* 79  --  129*  BUN 62* 47*  --  48*  CREATININE 4.47* 3.55*  --  3.43*  ALBUMIN 2.3* 2.4*  --  2.4*  CALCIUM 7.9* 7.9* 8.6 8.1*  PHOS 5.6* 4.0  --  3.7   Liver Function Tests: Recent Labs  Lab 01/17/21 1409 01/19/21 1705 01/22/21 1122  ALBUMIN 2.3* 2.4* 2.4*   No results for input(s): LIPASE, AMYLASE in the last 168 hours. Recent Labs  Lab 01/20/21 1535  AMMONIA 25   CBC: Recent Labs  Lab 01/17/21 1409 01/19/21 1705 01/22/21 1122  WBC 7.0 7.2 7.4  HGB 7.7* 8.1* 8.7*  HCT 25.0* 26.9* 28.1*  MCV 89.0 89.7 89.8  PLT 205 228 266   Cardiac Enzymes: No results for input(s): CKTOTAL, CKMB, CKMBINDEX, TROPONINI in the last 168 hours. CBG: Recent Labs  Lab 01/23/21 0733 01/23/21 1101 01/23/21 1630 01/23/21 2124 01/24/21 0737  GLUCAP 118* 137* 113* 145* 134*    Iron Studies: No results for input(s): IRON, TIBC, TRANSFERRIN, FERRITIN in the last 72 hours. Studies/Results: No results found.  apixaban  5 mg Oral BID   Chlorhexidine Gluconate Cloth  6 each Topical Q0600   cholecalciferol  1,000 Units Oral Daily    darbepoetin (ARANESP) injection - DIALYSIS  100 mcg Intravenous Q Mon-HD   feeding supplement (GLUCERNA SHAKE)  237 mL Oral BID BM   fluticasone  1 spray Each Nare Daily   insulin aspart  0-9 Units Subcutaneous TID WC   insulin aspart protamine- aspart  5 Units Subcutaneous BID WC   metoprolol succinate  25 mg Oral Q12H   nystatin   Topical TID   polyethylene glycol  17 g Oral Daily   pravastatin  80 mg Oral QPM   sevelamer carbonate  800 mg Oral BID WC   vitamin B-12  1,000 mcg Oral Daily   vitamin E  800 Units Oral Daily   zinc sulfate  220 mg Oral Daily    BMET    Component Value Date/Time   NA 136 01/22/2021 1122   K 3.8 01/22/2021 1122   CL 100 01/22/2021 1122   CO2 31 01/22/2021 1122   GLUCOSE 129 (H) 01/22/2021 1122   BUN 48 (H) 01/22/2021 1122   CREATININE 3.43 (H) 01/22/2021 1122   CALCIUM 8.1 (L) 01/22/2021 1122   CALCIUM 8.6 01/21/2021 0630   GFRNONAA 19 (L) 01/22/2021 1122   GFRAA 17 (  L) 03/30/2020 0620   CBC    Component Value Date/Time   WBC 7.4 01/22/2021 1122   RBC 3.13 (L) 01/22/2021 1122   HGB 8.7 (L) 01/22/2021 1122   HCT 28.1 (L) 01/22/2021 1122   PLT 266 01/22/2021 1122   MCV 89.8 01/22/2021 1122   MCH 27.8 01/22/2021 1122   MCHC 31.0 01/22/2021 1122   RDW 19.2 (H) 01/22/2021 1122   LYMPHSABS 0.7 01/12/2021 1202   MONOABS 0.6 01/12/2021 1202   EOSABS 0.3 01/12/2021 1202   BASOSABS 0.0 01/12/2021 1202     Assessment/Plan:  ESRD - s/p TDC placement 7/22 and HD initiated on 01/15/21, 7/25, 7/27, and 7/30.  For HD on TTS schedule  Symptomatic anemia - on ESA HTN/volume - improved with UF. CKD BMD - on renvela. DM type 2 - per primary.  Disposition-  awaiting SNF placement and outpatient HD to be arranged at [facility] on TTS schedule.   [name], MD [employer] [phone]

## 2021-01-24 NOTE — Progress Notes (Signed)
Occupational Therapy Treatment Patient Details Name: Danny [name] MRN: [id number] DOB: Jun 05, 1956 Today's Date: 01/24/2021    History of present illness Danny [name] is a 65 y.o. male with medical history significant of Anemia of Chronic disease, CKD stage 5, DM, Chronic LE ulcers from venous stasis dermatitis and DM, HTN, Urinary retention. Pt states he has been an overall physical decline for several days w/o acute complaint. On day of admission pt unable to ambulate from bed and care for self. Profound generalized weakness. Denies CP, SOB, palpitations, ABD pain, dysuria, Frequency, rash, n/v, HA, fever, Cough, hematochezia, melena. Still on Augmentin for Bronchitis that has essentially resolved per pt. Started on 01/04/21. Leg wounds (chronic) were wrapped by home health aid on 01/10/21 per pt. No change in medication recenlyt and endorses compliance.   OT comments  Pt agreeable to OT treatment. Pt not wanting to attempt standing today but was willing to complete B UE exercise at EOB. PT demonstrates difficulty reaching full A/ROM when seated at EOB. Verbal and tactile cuing needed during exercise with minimal benefit to increase A/ROM. Pt quickly fatigues and requires multiple rest breaks. Pt able to complete bed mobility with mod A this date. Pt will continue to benefit from acute OT services to address deficit areas. Pt left in bed eating breakfast with call bell and phone within reach.   Follow Up Recommendations  SNF    Equipment Recommendations  None recommended by OT          Precautions / Restrictions Precautions Precautions: Fall Restrictions Weight Bearing Restrictions: No       Mobility Bed Mobility Overal bed mobility: Needs Assistance Bed Mobility: Supine to Sit;Sit to Supine     Supine to sit: Mod assist;HOB elevated Sit to supine: Mod assist   General bed mobility comments: slow labored movement requiring assistance to move RLE, HOB and use of bed rail     Transfers                      Balance Overall balance assessment: Needs assistance Sitting-balance support: Feet supported;No upper extremity supported Sitting balance-Leahy Scale: Fair Sitting balance - Comments: sitting at EOB                                   ADL either performed or assessed with clinical judgement   ADL Overall ADL's : Needs assistance/impaired                     Lower Body Dressing: Total assistance;Bed level Lower Body Dressing Details (indicate cue type and reason): Donning socks at bed level.                                       Cognition Arousal/Alertness: Awake/alert Behavior During Therapy: WFL for tasks assessed/performed Overall Cognitive Status: Within Functional Limits for tasks assessed                                          Exercises Exercises: General Upper Extremity General Exercises - Upper Extremity Shoulder Flexion: AROM;10 reps;Seated (x10 protraction as well) Shoulder ABduction: AROM;10 reps;Seated (2x5 reps)  Pertinent Vitals/ Pain       Pain Assessment: No/denies pain                                                          Frequency  Min 2X/week        Progress Toward Goals  OT Goals(current goals can now be found in the care plan section)  Progress towards OT goals: Progressing toward goals  Acute Rehab OT Goals Patient Stated Goal: return home OT Goal Formulation: With patient Time For Goal Achievement: 01/27/21 Potential to Achieve Goals: Fair ADL Goals Pt Will Perform Grooming: sitting;with set-up;with adaptive equipment Pt Will Perform Lower Body Dressing: with min assist;with adaptive equipment;sitting/lateral leans;bed level Pt Will Transfer to Toilet: with mod assist;stand pivot transfer;bedside commode Pt/caregiver will Perform Home Exercise Program: Increased strength;Both right and left  upper extremity;Independently  Plan Discharge plan remains appropriate                                       End of Session    OT Visit Diagnosis: Unsteadiness on feet (R26.81);Muscle weakness (generalized) (M62.81)   Activity Tolerance Patient limited by fatigue   Patient Left in bed;with call bell/phone within reach             Time: 0926-0956 OT Time Calculation (min): 30 min  Charges: OT General Charges $OT Visit: 1 Visit OT Treatments $Self Care/Home Management : 8-22 mins $Therapeutic Exercise: 8-22 mins  Luretha Eberly OT, MOT   Larey Seat 01/24/2021, 10:39 AM

## 2021-01-24 NOTE — Progress Notes (Signed)
PROGRESS NOTE  Danny [name] QMV:[id number] DOB: 08/17/1955 DOA: 01/12/2021 PCP: Celene Squibb, MD  Brief History:  As per H&P written by Dr. Marily Memos on 01/12/2021 Danny [name] is a 65 y.o. male with medical history significant of Anemia of Chronic disease, CKD stage 5, DM, Chronic LE ulcers from venous stasis dermatitis and DM, HTN, Urinary retention. Pt states he has been an overall physical decline for several days w/o acute complaint. On day of admission pt unable to ambulate from bed and care for self. Profound generalized weakness. Denies CP, SOB, palpitations, ABD pain, dysuria, Frequency, rash, n/v, HA, fever, Cough, hematochezia, melena. Still on Augmentin for Bronchitis that has essentially resolved per pt. Started on 01/04/21. Leg wounds (chronic) were wrapped by home health aid on 01/10/21 per pt. No change in medication recenlyt and endorses compliance.    ED Course: Objective findings below. Started on 2 units PRBC infusion   Assessment/Plan: stage V CKD>>New ESRD -General surgery has placed dialysis catheter 7/22; -hemodialysis treatment initiation on 01/15/2021. -Second dialysis treatment well-tolerated on 01/17/2021 -third dialysis treatment on 01/19/2021 -had HD on 7/30 -Continue to follow renal diet, strict I's and O's and daily weight. -outpatient chair TTS schedule   Acute Respiratory Failure with Hypoxia -due to fluid overload -stable on 2L -wean to RA for saturation > 28%   Acute Metabolic Encephalopathy -pt has had cognitive and functional decline over last 6-12 months -multifactorial including renal failure, anemia, UTI -UA >50 WBC -check ammonia--25 -check VBG--7.405/48/54 on 2L -B12--3890 -TSH--2.433 -started empiric ceftriaxone pending cultures>>cefepime -mental status improving   Acute UTI -7/28 UA >50WBC -7/28 urine culture Morganella -discontinue ceftriaxone -start cefepime   Symptomatic anemia -And with underlying history of a stage V renal  failure and anemia of chronic kidney disease. -Hemoglobin down to 7.0 causing symptomatic anemia. -2 units PRBCs have been given; posttransfusion Hgb remains largely stable -Hemoglobin has remained stable post transfusion -Will continue to follow hemoglobin trend intermittently. -Transfusion threshold is for hemoglobin less than 7. -IV iron and Epogen therapy as per renal function discretion. -No overt bleeding appreciated.   Diabetes mellitus type 2 -follow CBG's and further adjust hypoglycemic regimen accordingly. -continue SSI -decreased 70/30 -7/24 A1C--6.8   Chronic diastolic CHF -Low-sodium diet encouraged. -Patient in need of dialysis initiation -Volume to be managed with dialysis treatment. -Continue to follow daily weights and strict I's and O's.   secondary hyperparathyroidism -continue the use of Renvela - follow nephrology further recommendations -Follow electrolytes stability.     essential hypertension -BP is soft but improving -HD treatment helping volume and BP   hyperlipidemia -Continue statins. -Heart healthy diet encouraged.   Venous stasis ulcers -has Unna boots -appreciate wound care recommendations>>> Daily cleanse with NS, pat dry. Cover wound with silver hydrofiber (Aquacel Ag+ Advantage), top with dry dressing, ABD and secure with Kerlix/paper tape.  Place foot into [employer].  May moisten with NS, as needed for removal   Atrial Fibrillation, type unspecified --5/7 Echo EF 55-60%; mild decrease RV fxn, RVSP 61.5 -continue metoprolol succinate>>increase to 50 mg bid -continue apixaban   Goals of Care -long discussion with sister and brother 7/28 -Advance care planning, including the explanation and discussion of advance directives was carried out with the patient and family.  Code status including explanations of "Full Code" and "DNR" and alternatives were discussed in detail.  Discussion of end-of-life issues including but not limited  palliative care, hospice care and the concept  of hospice, other end-of-life care options, power of attorney for health care decisions, living wills, and physician orders for life-sustaining treatment were also discussed with the patient and family.  Total face to face time 20 minutes. -change to DNR -if no change cognitively and functionally in next 2-3 weeks with HD and STR, then would consider residential hospice       Status is: Inpatient   Remains inpatient appropriate because:IV treatments appropriate due to intensity of illness or inability to take PO   Dispo: The patient is from: Home              Anticipated d/c is to: SNF              Patient currently is not medically stable to d/c.              Difficult to place patient No               Family Communication: brother and sister updated 7/30   Consultants:  renal   Code Status:  FULL   DVT Prophylaxis:  apixaban     Procedures: As Listed in Progress Note Above   Antibiotics: Ceftriaxone 7/28>>7/30 Cefepime 7/31>> Subjective: Patient denies fevers, chills, headache, chest pain, dyspnea, nausea, vomiting, diarrhea, abdominal pain, dysuria, hematuria, hematochezia, and melena.   Objective: Vitals:   01/23/21 2148 01/23/21 2300 01/24/21 0500 01/24/21 1409  BP:   (!) 118/52 (!) 144/86  Pulse: (!) 150 (!) 102 (!) 56 (!) 110  Resp:   20 18  Temp:   97.9 F (36.6 C) 98.6 F (37 C)  TempSrc:   Oral Oral  SpO2:   98% 95%  Weight:   127.2 kg   Height:        Intake/Output Summary (Last 24 hours) at 01/24/2021 1755 Last data filed at 01/24/2021 1559 Gross per 24 hour  Intake 580.06 ml  Output 1100 ml  Net -519.94 ml   Weight change: 2.1 kg Exam:  General:  Pt is alert, follows commands appropriately, not in acute distress HEENT: No icterus, No thrush, No neck mass, Gage/AT Cardiovascular: IRRR, S1/S2, no rubs, no gallops Respiratory: bibasilar rales. No wheeze Abdomen: Soft/+BS, non tender, non distended,  no guarding Extremities: 2 + LE edema, No lymphangitis, No petechiae, No rashes, no synovitis   Data Reviewed: I have personally reviewed following labs and imaging studies Basic Metabolic Panel: Recent Labs  Lab 01/19/21 1705 01/21/21 0630 01/22/21 1122  NA 134*  --  136  K 3.7  --  3.8  CL 99  --  100  CO2 29  --  31  GLUCOSE 79  --  129*  BUN 47*  --  48*  CREATININE 3.55*  --  3.43*  CALCIUM 7.9* 8.6 8.1*  PHOS 4.0  --  3.7   Liver Function Tests: Recent Labs  Lab 01/19/21 1705 01/22/21 1122  ALBUMIN 2.4* 2.4*   No results for input(s): LIPASE, AMYLASE in the last 168 hours. Recent Labs  Lab 01/20/21 1535  AMMONIA 25   Coagulation Profile: No results for input(s): INR, PROTIME in the last 168 hours. CBC: Recent Labs  Lab 01/19/21 1705 01/22/21 1122  WBC 7.2 7.4  HGB 8.1* 8.7*  HCT 26.9* 28.1*  MCV 89.7 89.8  PLT 228 266   Cardiac Enzymes: No results for input(s): CKTOTAL, CKMB, CKMBINDEX, TROPONINI in the last 168 hours. BNP: Invalid input(s): POCBNP CBG: Recent Labs  Lab 01/23/21 1630 01/23/21 2124 01/24/21 0737 01/24/21  1120 01/24/21 1612  GLUCAP 113* 145* 134* 133* 101*   HbA1C: No results for input(s): HGBA1C in the last 72 hours. Urine analysis:    Component Value Date/Time   COLORURINE AMBER (A) 01/20/2021 1114   APPEARANCEUR TURBID (A) 01/20/2021 1114   APPEARANCEUR Clear 09/10/2020 1122   LABSPEC 1.007 01/20/2021 1114   PHURINE 9.0 (H) 01/20/2021 1114   GLUCOSEU NEGATIVE 01/20/2021 1114   HGBUR SMALL (A) 01/20/2021 1114   BILIRUBINUR NEGATIVE 01/20/2021 1114   BILIRUBINUR Negative 09/10/2020 1122   KETONESUR NEGATIVE 01/20/2021 1114   PROTEINUR >=300 (A) 01/20/2021 1114   NITRITE NEGATIVE 01/20/2021 1114   LEUKOCYTESUR LARGE (A) 01/20/2021 1114   Sepsis Labs: @LABRCNTIP (procalcitonin:4,lacticidven:4) ) Recent Results (from the past 240 hour(s))  Urine Culture     Status: Abnormal   Collection Time: 01/20/21 11:14 AM    Specimen: Urine, Clean Catch  Result Value Ref Range Status   Specimen Description   Final    URINE, CLEAN CATCH Performed at [facility], [address] [place], [place] [address]    Special Requests   Final    NONE Performed at [facility], [address] [place], [place] [address]    Culture >=100,000 COLONIES/mL [facility] MORGANII (A)  Final   Report Status 01/23/2021 FINAL  Final   Organism ID, Bacteria MORGANELLA MORGANII (A)  Final      Susceptibility   Morganella morganii - MIC*    AMPICILLIN >=32 RESISTANT Resistant     CEFAZOLIN >=64 RESISTANT Resistant     CIPROFLOXACIN <=0.25 SENSITIVE Sensitive     GENTAMICIN <=1 SENSITIVE Sensitive     IMIPENEM 1 SENSITIVE Sensitive     NITROFURANTOIN RESISTANT Resistant     TRIMETH/SULFA <=20 SENSITIVE Sensitive     AMPICILLIN/SULBACTAM >=32 RESISTANT Resistant     PIP/TAZO <=4 SENSITIVE Sensitive     * >=100,000 COLONIES/mL MORGANELLA MORGANII     Scheduled Meds:  apixaban  5 mg Oral BID   Chlorhexidine Gluconate Cloth  6 each Topical Q0600   cholecalciferol  1,000 Units Oral Daily   [START ON 01/25/2021] darbepoetin (ARANESP) injection - DIALYSIS  100 mcg Intravenous Q Tue-HD   feeding supplement (GLUCERNA SHAKE)  237 mL Oral BID BM   fluticasone  1 spray Each Nare Daily   insulin aspart  0-9 Units Subcutaneous TID WC   insulin aspart protamine- aspart  5 Units Subcutaneous BID WC   metoprolol succinate  25 mg Oral Q12H   nystatin   Topical TID   polyethylene glycol  17 g Oral Daily   pravastatin  80 mg Oral QPM   sevelamer carbonate  800 mg Oral BID WC   vitamin B-12  1,000 mcg Oral Daily   vitamin E  800 Units Oral Daily   zinc sulfate  220 mg Oral Daily   Continuous Infusions:  sodium chloride     sodium chloride     ceFEPime (MAXIPIME) IV 2 g (01/24/21 1603)   methocarbamol (ROBAXIN) IV      Procedures/Studies: DG Chest Port 1 View  Result Date: 01/14/2021 CLINICAL DATA:  Vascular dialysis catheter in  place. EXAM: PORTABLE CHEST 1 VIEW COMPARISON:  Radiograph 01/12/2021. FINDINGS: New right internal jugular central venous catheter tip overlies the lower SVC. No pneumothorax. Stable cardiomegaly. Slight worsening right basilar aeration and blunting of the costophrenic angle, suspicious for increasing pleural effusion and atelectasis. Vascular congestion. IMPRESSION: 1. New right internal jugular central venous catheter with tip overlying the lower SVC. No pneumothorax.  2. Slight worsening right basilar aeration and blunting of the costophrenic angle, suspicious for increasing pleural effusion and atelectasis. Unchanged cardiomegaly. Electronically Signed   By: Keith Rake M.D.   On: 01/14/2021 15:06   DG Chest Portable 1 View  Result Date: 01/12/2021 CLINICAL DATA:  Weakness. EXAM: PORTABLE CHEST 1 VIEW COMPARISON:  January 03, 2021. FINDINGS: Stable cardiomegaly. No pneumothorax or pleural effusion is noted. Stable bibasilar subsegmental atelectasis. Bony thorax is unremarkable. IMPRESSION: Stable bibasilar subsegmental atelectasis. Electronically Signed   By: Marijo Conception M.D.   On: 01/12/2021 12:11   DG Chest Port 1 View  Result Date: 01/03/2021 CLINICAL DATA:  Shortness of breath EXAM: PORTABLE CHEST 1 VIEW COMPARISON:  November 29, 2020 FINDINGS: Stable cardiomegaly. Aortic atherosclerosis. Bibasilar opacities, favor atelectasis. No visible pleural effusion or pneumothorax. The visualized skeletal structures are unchanged. IMPRESSION: 1. Stable cardiomegaly. 2. Bibasilar opacities, favor atelectasis. 3.  Aortic Atherosclerosis (ICD10-I70.0). Electronically Signed   By: Dahlia Bailiff MD   On: 01/03/2021 18:29   DG C-Arm 1-60 Min-No Report  Result Date: 01/14/2021 Fluoroscopy was utilized by the requesting physician.  No radiographic interpretation.    Orson Eva, DO  [facility]  If 7PM-7AM, please contact night-coverage [email or web address] Password [email or web address] 01/24/2021, 5:55 PM   LOS: 12 days

## 2021-01-24 NOTE — Plan of Care (Signed)

## 2021-01-24 NOTE — Progress Notes (Signed)
Physical Therapy Treatment Patient Details Name: Danny [name] MRN: [id number] DOB: February 17, 1956 Today's Date: 01/24/2021    History of Present Illness Danny [name] is a 65 y.o. male with medical history significant of Anemia of Chronic disease, CKD stage 5, DM, Chronic LE ulcers from venous stasis dermatitis and DM, HTN, Urinary retention. Pt states he has been an overall physical decline for several days w/o acute complaint. On day of admission pt unable to ambulate from bed and care for self. Profound generalized weakness. Denies CP, SOB, palpitations, ABD pain, dysuria, Frequency, rash, n/v, HA, fever, Cough, hematochezia, melena. Still on Augmentin for Bronchitis that has essentially resolved per pt. Started on 01/04/21. Leg wounds (chronic) were wrapped by home health aid on 01/10/21 per pt. No change in medication recenlyt and endorses compliance.    PT Comments    Patient agreeable for therapy.  Patient requires less assistance for rolling side to side, slow labored movement for sitting up at bedside with Mod assist to pull self to sitting, and demonstrates fair/good trunk control while seated at bedside while completing BLE ROM/strengthening exercises.  Patient required active assistance for completing most exercises due to BLE weakness and unable to attempt sit to stands due to fatigue.  Patient demonstrates fair/good return for using BUE and LLE to help reposition self in bed with with bed in head down position.  Patient will benefit from continued physical therapy in hospital and recommended venue below to increase strength, balance, endurance for safe ADLs and gait.    Follow Up Recommendations  SNF     Equipment Recommendations       Recommendations for Other Services       Precautions / Restrictions Precautions Precautions: Fall Restrictions Weight Bearing Restrictions: No    Mobility  Bed Mobility Overal bed mobility: Needs Assistance Bed Mobility: Supine to Sit;Sit to  Supine Rolling: Min assist   Supine to sit: Mod assist;Independent Sit to supine: Mod assist   General bed mobility comments: requires less assistance for rolling side to side, slow labored movement for sitting up at bedside due to RLE weakness/discomfort    Transfers                    Ambulation/Gait                 Stairs             Wheelchair Mobility    Modified Rankin (Stroke Patients Only)       Balance Overall balance assessment: Needs assistance Sitting-balance support: Feet supported;No upper extremity supported Sitting balance-Leahy Scale: Fair Sitting balance - Comments: fair/good with BUE supporting                                    Cognition Arousal/Alertness: Awake/alert Behavior During Therapy: WFL for tasks assessed/performed Overall Cognitive Status: Within Functional Limits for tasks assessed                                        Exercises General Exercises - Lower Extremity Long Arc Quad: Seated;Strengthening;Both;10 reps;AAROM Hip Flexion/Marching: Seated;AAROM;Strengthening;Both;10 reps Toe Raises: Seated;AROM;Strengthening;Left;10 reps Heel Raises: Seated;AROM;Strengthening;Both;10 reps    General Comments        Pertinent Vitals/Pain Pain Assessment: No/denies pain    Home Living  Prior Function            PT Goals (current goals can now be found in the care plan section) Acute Rehab PT Goals Patient Stated Goal: return home PT Goal Formulation: With patient Time For Goal Achievement: 02/03/21 Potential to Achieve Goals: Good Progress towards PT goals: Progressing toward goals    Frequency    Min 3X/week      PT Plan Current plan remains appropriate    Co-evaluation              AM-PAC PT "6 Clicks" Mobility   Outcome Measure  Help needed turning from your back to your side while in a flat bed without using bedrails?: A  Little Help needed moving from lying on your back to sitting on the side of a flat bed without using bedrails?: A Lot Help needed moving to and from a bed to a chair (including a wheelchair)?: Total Help needed standing up from a chair using your arms (e.g., wheelchair or bedside chair)?: Total Help needed to walk in hospital room?: Total Help needed climbing 3-5 steps with a railing? : Total 6 Click Score: 9    End of Session Equipment Utilized During Treatment: Oxygen Activity Tolerance: Patient tolerated treatment well;Patient limited by fatigue Patient left: in bed;with call bell/phone within reach Nurse Communication: Mobility status PT Visit Diagnosis: Unsteadiness on feet (R26.81);Other abnormalities of gait and mobility (R26.89);Muscle weakness (generalized) (M62.81)     Time: 7628-3151 PT Time Calculation (min) (ACUTE ONLY): 27 min  Charges:  $Therapeutic Exercise: 8-22 mins $Therapeutic Activity: 8-22 mins                     3:28 PM, 01/24/21 Lonell Grandchild, MPT Physical Therapist with [facility] [phone] office [phone] mobile phone

## 2021-01-24 NOTE — Progress Notes (Signed)
   01/24/21 1915  Vitals  Temp 98.6 F (37 C)  Temp Source Oral  BP 124/78  BP Location Left Arm  BP Method Automatic  Patient Position (if appropriate) Lying  Pulse Rate (!) 116  Pulse Rate Source Dinamap  Resp 18  Level of Consciousness  Level of Consciousness Alert  MEWS COLOR  MEWS Score Color Yellow  Oxygen Therapy  SpO2 98 %  O2 Device Nasal Cannula  O2 Flow Rate (L/min) 2 L/min  Patient Activity (if Appropriate) In bed  Pulse Oximetry Type Intermittent  Pain Assessment  Pain Scale Faces  Pain Score 0  Faces Pain Scale 0  Pain Type Chronic pain  Pain Location Back  Pain Orientation Left;Right  Pain Descriptors / Indicators Aching;Discomfort  Pain Frequency Constant  Pain Onset On-going  Patients Stated Pain Goal 0  Pain Intervention(s) Environmental changes;Emotional support;Relaxation;Rest  Multiple Pain Sites No  POSS Scale (Pasero Opioid Sedation Scale)  POSS *See Group Information* S-Acceptable,Sleep, easy to arouse  PCA/Epidural/Spinal Assessment  Respiratory Pattern Regular;Unlabored;Symmetrical  Glasgow Coma Scale  Eye Opening 4  Best Verbal Response (NON-intubated) 5  Best Motor Response 6  Glasgow Coma Scale Score 15  MEWS Score  MEWS Temp 0  MEWS Systolic 0  MEWS Pulse 2  MEWS RR 0  MEWS LOC 0  MEWS Score 2  Provider Notification  Provider Name/Title Dr. Josephine Cables  Date Provider Notified 01/24/21  Time Provider Notified 1915  Notification Type Page  Notification Reason Change in status  Provider response At bedside  Date of Provider Response 01/24/21  Time of Provider Response 1915  Rapid Response Notification  Name of Rapid Response RN Notified Benjamine Mola RN  Date Rapid Response Notified 01/24/21  Time Rapid Response Notified 1915

## 2021-01-24 NOTE — Progress Notes (Signed)
RN called due to patient being more drowsy and lethargic, apparently he was more active during the day.  Vital signs checked: BP  124/78, hr 116, 100% on his o2, temp 98.56F, sugar is 124 At bedside, patient was alert and oriented x4, though he occasionally closes his eyes as if falling asleep, but he easily self arouse within seconds and continue conversation without any sign of confusion. No current labs noted for today, CBC, CMP, magnesium, phosphorus will be done. EKG shows A. fib with RVR with current rate  at 104 bpm (patient has history of A. fib). We shall continue to monitor patient and treat accordingly

## 2021-01-24 NOTE — Progress Notes (Signed)
Patient's sacral wound dressing was not on during assessment. Cleansed his buttocks and applied xeroform gauze, 2x2 gauze over that and a mepilex to cover. Patient tolerated well. He had a small BM earlier and stated he thought he may be able to have another one after dinner. Bowel sounds are active. Patient remains pleasant and hopeful of his disposition. Condom cath applied to patient, draining well.

## 2021-01-25 ENCOUNTER — Other Ambulatory Visit (HOSPITAL_COMMUNITY): Payer: Medicare HMO | Admitting: General Practice

## 2021-01-25 DIAGNOSIS — N186 End stage renal disease: Secondary | ICD-10-CM | POA: Diagnosis not present

## 2021-01-25 DIAGNOSIS — G9341 Metabolic encephalopathy: Secondary | ICD-10-CM | POA: Diagnosis not present

## 2021-01-25 DIAGNOSIS — D649 Anemia, unspecified: Secondary | ICD-10-CM | POA: Diagnosis not present

## 2021-01-25 LAB — CBC
HCT: 28.2 % — ABNORMAL LOW (ref 39.0–52.0)
Hemoglobin: 8.6 g/dL — ABNORMAL LOW (ref 13.0–17.0)
MCH: 27.3 pg (ref 26.0–34.0)
MCHC: 30.5 g/dL (ref 30.0–36.0)
MCV: 89.5 fL (ref 80.0–100.0)
Platelets: 243 10*3/uL (ref 150–400)
RBC: 3.15 MIL/uL — ABNORMAL LOW (ref 4.22–5.81)
RDW: 18.6 % — ABNORMAL HIGH (ref 11.5–15.5)
WBC: 7.9 10*3/uL (ref 4.0–10.5)
nRBC: 0 % (ref 0.0–0.2)

## 2021-01-25 LAB — GLUCOSE, CAPILLARY
Glucose-Capillary: 121 mg/dL — ABNORMAL HIGH (ref 70–99)
Glucose-Capillary: 132 mg/dL — ABNORMAL HIGH (ref 70–99)
Glucose-Capillary: 109 mg/dL — ABNORMAL HIGH (ref 70–99)
Glucose-Capillary: 127 mg/dL — ABNORMAL HIGH (ref 70–99)

## 2021-01-25 LAB — RENAL FUNCTION PANEL
Albumin: 2.5 g/dL — ABNORMAL LOW (ref 3.5–5.0)
Anion gap: 7 (ref 5–15)
BUN: 47 mg/dL — ABNORMAL HIGH (ref 8–23)
CO2: 28 mmol/L (ref 22–32)
Calcium: 8.7 mg/dL — ABNORMAL LOW (ref 8.9–10.3)
Chloride: 101 mmol/L (ref 98–111)
Creatinine, Ser: 3.33 mg/dL — ABNORMAL HIGH (ref 0.61–1.24)
GFR, Estimated: 19 mL/min — ABNORMAL LOW (ref >60)
Glucose, Bld: 115 mg/dL — ABNORMAL HIGH (ref 70–99)
Phosphorus: 3.5 mg/dL (ref 2.5–4.6)
Potassium: 4.4 mmol/L (ref 3.5–5.1)
Sodium: 136 mmol/L (ref 135–145)

## 2021-01-25 MED ORDER — DARBEPOETIN ALFA 100 MCG/0.5ML IJ SOSY
PREFILLED_SYRINGE | INTRAMUSCULAR | Status: AC
  Filled 2021-01-25: qty 0.5

## 2021-01-25 MED ORDER — CIPROFLOXACIN HCL 250 MG PO TABS
250.0000 mg | ORAL_TABLET | ORAL | Status: DC
  Administered 2021-01-26: 250 mg via ORAL
  Filled 2021-01-25 (×2): qty 1

## 2021-01-25 MED ORDER — ALBUMIN HUMAN 25 % IV SOLN
INTRAVENOUS | Status: AC
  Administered 2021-01-25: 25 g
  Filled 2021-01-25: qty 200

## 2021-01-25 MED ORDER — BISACODYL 10 MG RE SUPP
10.0000 mg | Freq: Once | RECTAL | Status: AC
  Administered 2021-01-25: 10 mg via RECTAL
  Filled 2021-01-25: qty 1

## 2021-01-25 MED ORDER — HEPARIN SODIUM (PORCINE) 1000 UNIT/ML IJ SOLN
INTRAMUSCULAR | Status: AC
  Administered 2021-01-25: 3800 [IU] via INTRAVENOUS_CENTRAL
  Filled 2021-01-25: qty 6

## 2021-01-25 NOTE — Progress Notes (Signed)
Physical Therapy Treatment Patient Details Name: Danny [name] MRN: [id number] DOB: 07-30-1955 Today's Date: 01/25/2021    History of Present Illness Danny [name] is a 65 y.o. male with medical history significant of Anemia of Chronic disease, CKD stage 5, DM, Chronic LE ulcers from venous stasis dermatitis and DM, HTN, Urinary retention. Pt states he has been an overall physical decline for several days w/o acute complaint. On day of admission pt unable to ambulate from bed and care for self. Profound generalized weakness. Denies CP, SOB, palpitations, ABD pain, dysuria, Frequency, rash, n/v, HA, fever, Cough, hematochezia, melena. Still on Augmentin for Bronchitis that has essentially resolved per pt. Started on 01/04/21. Leg wounds (chronic) were wrapped by home health aid on 01/10/21 per pt. No change in medication recenlyt and endorses compliance.    PT Comments    Pt limited by quick fatigue with all activities required 2 posterior lean rest breaks while working on seated balance activities.  Cueing for reaching to bed rail to assist with rolling for transition to sitting on EOB, did require assistance with BLE.  Active assisted required with LE strengthening due to weakness.  Did not attempt sit to stand this session due to fatigue.     Follow Up Recommendations  SNF     Equipment Recommendations       Recommendations for Other Services       Precautions / Restrictions Precautions Precautions: Fall Restrictions Weight Bearing Restrictions: No    Mobility  Bed Mobility Overal bed mobility: Needs Assistance Bed Mobility: Supine to Sit;Sit to Supine Rolling: Min assist Sidelying to sit: HOB elevated;Mod assist Supine to sit: Mod assist;Independent Sit to supine: Mod assist   General bed mobility comments: cueing for handplacement towards bed rail to assist with rolling, required mod A with LE Rt>Lt due to weakness    Transfers                    Ambulation/Gait                  Stairs             Wheelchair Mobility    Modified Rankin (Stroke Patients Only)       Balance                                            Cognition Arousal/Alertness: Awake/alert Behavior During Therapy: WFL for tasks assessed/performed Overall Cognitive Status: Within Functional Limits for tasks assessed                                        Exercises General Exercises - Upper Extremity Shoulder Flexion: AROM;10 reps;Seated General Exercises - Lower Extremity Ankle Circles/Pumps: 10 reps Quad Sets: 10 reps;Supine Gluteal Sets: 10 reps;Supine Long Arc Quad: Seated;Strengthening;Both;10 reps;AAROM Heel Slides: Supine Hip ABduction/ADduction: Both;10 reps;Seated Hip Flexion/Marching: Seated;AAROM;Strengthening;Both;10 reps Toe Raises: Seated;AROM;Strengthening;Left;10 reps Heel Raises: Seated;AROM;Strengthening;Both;10 reps    General Comments        Pertinent Vitals/Pain Pain Assessment: No/denies pain    Home Living                      Prior Function            PT Goals (current goals can now be  found in the care plan section)      Frequency    Min 3X/week      PT Plan Current plan remains appropriate    Co-evaluation              AM-PAC PT "6 Clicks" Mobility   Outcome Measure  Help needed turning from your back to your side while in a flat bed without using bedrails?: A Little Help needed moving from lying on your back to sitting on the side of a flat bed without using bedrails?: A Lot Help needed moving to and from a bed to a chair (including a wheelchair)?: Total Help needed standing up from a chair using your arms (e.g., wheelchair or bedside chair)?: Total Help needed to walk in hospital room?: Total Help needed climbing 3-5 steps with a railing? : Total 6 Click Score: 9    End of Session Equipment Utilized During Treatment: Oxygen Activity Tolerance: Patient  tolerated treatment well;Patient limited by fatigue Patient left: in bed;with call bell/phone within reach Nurse Communication: Mobility status PT Visit Diagnosis: Unsteadiness on feet (R26.81);Other abnormalities of gait and mobility (R26.89);Muscle weakness (generalized) (M62.81)     Time: 9201-0071 PT Time Calculation (min) (ACUTE ONLY): 27 min  Charges:                       Ihor Austin, LPTA/CLT; CBIS [phone]   Aldona Lento 01/25/2021, 1:02 PM

## 2021-01-25 NOTE — Progress Notes (Signed)
Patient ID: Taeshawn Helfman, male   DOB: September 23, 1955, 65 y.o.   MRN: [id number] S: had an episode of drowsiness/lethargy after dinner yesterday. Nothing acute found, feels back to baseline now. He is concerned about what happened. O:BP (!) 147/84 (BP Location: Left Arm)   Pulse (!) 56   Temp 97.7 F (36.5 C) (Oral)   Resp 18   Ht 6\' 1"  (1.854 m)   Wt 124 kg   SpO2 100%   BMI 36.07 kg/m   Intake/Output Summary (Last 24 hours) at 01/25/2021 3785 Last data filed at 01/25/2021 0400 Gross per 24 hour  Intake 580.06 ml  Output 900 ml  Net -319.94 ml   Intake/Output: I/O last 3 completed shifts: In: 580.1 [P.O.:480; IV Piggyback:100.1] Out: 1800 [Urine:1800]  Intake/Output this shift:  No intake/output data recorded. Weight change: -3.2 kg Gen:NAD CVS: RRR Resp: CTA Abd: +BS, soft, NT/ND Ext: 1+ pretibial edema bilaterally  Recent Labs  Lab 01/19/21 1705 01/21/21 0630 01/22/21 1122 01/24/21 1952 01/25/21 0542  NA 134*  --  136 132* 136  K 3.7  --  3.8 4.2 4.4  CL 99  --  100 99 101  CO2 29  --  31 27 28   GLUCOSE 79  --  129* 115* 115*  BUN 47*  --  48* 44* 47*  CREATININE 3.55*  --  3.43* 3.24* 3.33*  ALBUMIN 2.4*  --  2.4* 2.5* 2.5*  CALCIUM 7.9* 8.6 8.1* 8.1* 8.7*  PHOS 4.0  --  3.7 3.5 3.5  AST  --   --   --  36  --   ALT  --   --   --  18  --    Liver Function Tests: Recent Labs  Lab 01/22/21 1122 01/24/21 1952 01/25/21 0542  AST  --  36  --   ALT  --  18  --   ALKPHOS  --  242*  --   BILITOT  --  1.0  --   PROT  --  6.5  --   ALBUMIN 2.4* 2.5* 2.5*   No results for input(s): LIPASE, AMYLASE in the last 168 hours. Recent Labs  Lab 01/20/21 1535  AMMONIA 25   CBC: Recent Labs  Lab 01/19/21 1705 01/22/21 1122 01/24/21 1952 01/25/21 0542  WBC 7.2 7.4 8.1 7.9  HGB 8.1* 8.7* 8.3* 8.6*  HCT 26.9* 28.1* 26.9* 28.2*  MCV 89.7 89.8 89.4 89.5  PLT 228 266 231 243   Cardiac Enzymes: No results for input(s): CKTOTAL, CKMB, CKMBINDEX, TROPONINI in the last  168 hours. CBG: Recent Labs  Lab 01/24/21 1120 01/24/21 1612 01/24/21 1905 01/24/21 2217 01/25/21 0717  GLUCAP 133* 101* 124* 122* 121*    Iron Studies: No results for input(s): IRON, TIBC, TRANSFERRIN, FERRITIN in the last 72 hours. Studies/Results: No results found.  apixaban  5 mg Oral BID   Chlorhexidine Gluconate Cloth  6 each Topical Q0600   cholecalciferol  1,000 Units Oral Daily   darbepoetin (ARANESP) injection - DIALYSIS  100 mcg Intravenous Q Tue-HD   feeding supplement (GLUCERNA SHAKE)  237 mL Oral BID BM   fluticasone  1 spray Each Nare Daily   insulin aspart  0-9 Units Subcutaneous TID WC   insulin aspart protamine- aspart  5 Units Subcutaneous BID WC   metoprolol succinate  25 mg Oral Q12H   nystatin   Topical TID   polyethylene glycol  17 g Oral Daily   pravastatin  80 mg Oral QPM  sevelamer carbonate  800 mg Oral BID WC   vitamin B-12  1,000 mcg Oral Daily   vitamin E  800 Units Oral Daily   zinc sulfate  220 mg Oral Daily    BMET    Component Value Date/Time   NA 136 01/25/2021 0542   K 4.4 01/25/2021 0542   CL 101 01/25/2021 0542   CO2 28 01/25/2021 0542   GLUCOSE 115 (H) 01/25/2021 0542   BUN 47 (H) 01/25/2021 0542   CREATININE 3.33 (H) 01/25/2021 0542   CALCIUM 8.7 (L) 01/25/2021 0542   CALCIUM 8.6 01/21/2021 0630   GFRNONAA 19 (L) 01/25/2021 0542   GFRAA 17 (L) 03/30/2020 0620   CBC    Component Value Date/Time   WBC 7.9 01/25/2021 0542   RBC 3.15 (L) 01/25/2021 0542   HGB 8.6 (L) 01/25/2021 0542   HCT 28.2 (L) 01/25/2021 0542   PLT 243 01/25/2021 0542   MCV 89.5 01/25/2021 0542   MCH 27.3 01/25/2021 0542   MCHC 30.5 01/25/2021 0542   RDW 18.6 (H) 01/25/2021 0542   LYMPHSABS 0.7 01/12/2021 1202   MONOABS 0.6 01/12/2021 1202   EOSABS 0.3 01/12/2021 1202   BASOSABS 0.0 01/12/2021 1202     Assessment/Plan:  ESRD - s/p TDC placement 7/22 and HD initiated on 01/15/21, 7/25, 7/27, and 7/30.  For HD on TTS schedule  Symptomatic  anemia - on ESA HTN/volume - improved with UF. CKD BMD - on renvela. DM type 2 - per primary.  Disposition-  awaiting SNF placement and outpatient HD to be arranged at [facility] on TTS schedule.   Gean Quint, MD [facility]

## 2021-01-25 NOTE — Procedures (Signed)
   HEMODIALYSIS TREATMENT NOTE:  3.5 hour heparin-free HD completed via right IJ TDC.  UF was limited by hypotension despite cooling of dialysate and administration of Albumin 25g x 2.  Net UF 1.9 liters.  All blood was returned.  No changes from pre-HD assessment.  Rockwell Alexandria, RN

## 2021-01-25 NOTE — Progress Notes (Signed)
PROGRESS NOTE  Danny [name] RXV:[id number] DOB: [birth year] DOA: 01/12/2021 PCP: Celene Squibb, MD  Brief History:  As per H&P written by Dr. Marily Memos on 01/12/2021 Danny [name] is a 65 y.o. male with medical history significant of Anemia of Chronic disease, CKD stage 5, DM, Chronic LE ulcers from venous stasis dermatitis and DM, HTN, Urinary retention. Pt states he has been an overall physical decline for several days w/o acute complaint. On day of admission pt unable to ambulate from bed and care for self. Profound generalized weakness. Denies CP, SOB, palpitations, ABD pain, dysuria, Frequency, rash, n/v, HA, fever, Cough, hematochezia, melena. Still on Augmentin for Bronchitis that has essentially resolved per pt. Started on 01/04/21. Leg wounds (chronic) were wrapped by home health aid on 01/10/21 per pt. No change in medication recenlyt and endorses compliance.    ED Course: Objective findings below. Started on 2 units PRBC infusion   Assessment/Plan: stage V CKD>>New ESRD -General surgery has placed dialysis catheter 7/22; -hemodialysis treatment initiation on 01/15/2021. -Second dialysis treatment well-tolerated on 01/17/2021 -third dialysis treatment on 01/19/2021 -had HD on 7/30, 8/2 -Continue to follow renal diet, strict I's and O's and daily weight. -outpatient chair TTS schedule obtained   Acute Respiratory Failure with Hypoxia -due to fluid overload -stable on 2L -wean to RA for saturation > 92% -fluid removal on HD   Acute Metabolic Encephalopathy -pt has had cognitive and functional decline over last 6-12 months -multifactorial including renal failure, anemia, UTI -7/28 UA >50 WBC -check ammonia--25 -check VBG--7.405/48/54 on 2L -B12--3890 -TSH--2.433 -started empiric ceftriaxone pending cultures>>cefepime -01/25/21 mental status at baseline    Acute UTI -7/28 UA >50WBC -7/28 urine culture Morganella -discontinue ceftriaxone -start cefepime 7/31>>cipro po on 8/3    Symptomatic anemia -And with underlying history of a stage V renal failure and anemia of chronic kidney disease. -Hemoglobin down to 7.0 causing symptomatic anemia. -2 units PRBCs have been given; posttransfusion Hgb remains largely stable -Hemoglobin has remained stable post transfusion -Will continue to follow hemoglobin trend intermittently. -Transfusion threshold is for hemoglobin less than 7. -IV iron and Epogen therapy as per renal function discretion. -No overt bleeding appreciated.   Diabetes mellitus type 2 -follow CBG's and further adjust hypoglycemic regimen accordingly. -continue SSI -decreased 70/30 -7/24 A1C--6.8   Chronic diastolic CHF -Low-sodium diet encouraged. -Patient in need of dialysis initiation -Volume to be managed with dialysis treatment. -Continue to follow daily weights and strict I's and O's.   secondary hyperparathyroidism -continue the use of Renvela - follow nephrology further recommendations -Follow electrolytes stability.     essential hypertension -BP is soft but improving -HD treatment helping volume and BP   hyperlipidemia -Continue statins. -Heart healthy diet encouraged.   Venous stasis ulcers -has Unna boots -appreciate wound care recommendations>>> Daily cleanse with NS, pat dry. Cover wound with silver hydrofiber (Aquacel Ag+ Advantage), top with dry dressing, ABD and secure with Kerlix/paper tape.  Place foot into [employer].  May moisten with NS, as needed for removal   Atrial Fibrillation, type unspecified --5/7 Echo EF 55-60%; mild decrease RV fxn, RVSP 61.5 -continue metoprolol succinate -continue apixaban   Goals of Care -long discussion with sister and brother 7/28 -Advance care planning, including the explanation and discussion of advance directives was carried out with the patient and family.  Code status including explanations of "Full Code" and "DNR" and alternatives were discussed in detail.  Discussion of  end-of-life issues including  but not limited palliative care, hospice care and the concept of hospice, other end-of-life care options, power of attorney for health care decisions, living wills, and physician orders for life-sustaining treatment were also discussed with the patient and family.  Total face to face time 20 minutes. -change to DNR -if no change cognitively and functionally in next 2-3 weeks with HD and STR, then would consider residential hospice       Status is: Inpatient   Remains inpatient appropriate because:IV treatments appropriate due to intensity of illness or inability to take PO   Dispo: The patient is from: Home              Anticipated d/c is to: SNF              Patient currently is not medically stable to d/c.              Difficult to place patient No               Family Communication: brother and sister updated 7/31   Consultants:  renal   Code Status:  FULL   DVT Prophylaxis:  apixaban     Procedures: As Listed in Progress Note Above   Antibiotics: Ceftriaxone 7/28>>7/30 Cefepime 7/31>>    Subjective: Patient denies fevers, chills, headache, chest pain, dyspnea, nausea, vomiting, diarrhea, abdominal pain, dysuria, hematuria, hematochezia, and melena.   Objective: Vitals:   01/25/21 1340 01/25/21 1645 01/25/21 1655 01/25/21 1700  BP: 119/61 108/73 111/72 108/70  Pulse: (!) 110 (!) 111 (!) 112 (!) 111  Resp: 18 18 18 16   Temp: 97.6 F (36.4 C) 97.8 F (36.6 C)    TempSrc: Oral Oral    SpO2: 100% 100%    Weight:  124.1 kg    Height:        Intake/Output Summary (Last 24 hours) at 01/25/2021 1834 Last data filed at 01/25/2021 1300 Gross per 24 hour  Intake 720 ml  Output 900 ml  Net -180 ml   Weight change: -3.2 kg Exam:  General:  Pt is alert, follows commands appropriately, not in acute distress HEENT: No icterus, No thrush, No neck mass, [place]/AT Cardiovascular: RRR, S1/S2, no rubs, no gallops Respiratory: bibasilar rales.  No wheeze Abdomen: Soft/+BS, non tender, non distended, no guarding Extremities: 1 + LE edema, No lymphangitis, No petechiae, No rashes, no synovitis   Data Reviewed: I have personally reviewed following labs and imaging studies Basic Metabolic Panel: Recent Labs  Lab 01/19/21 1705 01/21/21 0630 01/22/21 1122 01/24/21 1952 01/25/21 0542  NA 134*  --  136 132* 136  K 3.7  --  3.8 4.2 4.4  CL 99  --  100 99 101  CO2 29  --  31 27 28   GLUCOSE 79  --  129* 115* 115*  BUN 47*  --  48* 44* 47*  CREATININE 3.55*  --  3.43* 3.24* 3.33*  CALCIUM 7.9* 8.6 8.1* 8.1* 8.7*  MG  --   --   --  2.1  --   PHOS 4.0  --  3.7 3.5 3.5   Liver Function Tests: Recent Labs  Lab 01/19/21 1705 01/22/21 1122 01/24/21 1952 01/25/21 0542  AST  --   --  36  --   ALT  --   --  18  --   ALKPHOS  --   --  242*  --   BILITOT  --   --  1.0  --   PROT  --   --  6.5  --   ALBUMIN 2.4* 2.4* 2.5* 2.5*   No results for input(s): LIPASE, AMYLASE in the last 168 hours. Recent Labs  Lab 01/20/21 1535  AMMONIA 25   Coagulation Profile: No results for input(s): INR, PROTIME in the last 168 hours. CBC: Recent Labs  Lab 01/19/21 1705 01/22/21 1122 01/24/21 1952 01/25/21 0542  WBC 7.2 7.4 8.1 7.9  HGB 8.1* 8.7* 8.3* 8.6*  HCT 26.9* 28.1* 26.9* 28.2*  MCV 89.7 89.8 89.4 89.5  PLT 228 266 231 243   Cardiac Enzymes: No results for input(s): CKTOTAL, CKMB, CKMBINDEX, TROPONINI in the last 168 hours. BNP: Invalid input(s): POCBNP CBG: Recent Labs  Lab 01/24/21 1905 01/24/21 2217 01/25/21 0717 01/25/21 1112 01/25/21 1613  GLUCAP 124* 122* 121* 132* 109*   HbA1C: No results for input(s): HGBA1C in the last 72 hours. Urine analysis:    Component Value Date/Time   COLORURINE AMBER (A) 01/20/2021 1114   APPEARANCEUR TURBID (A) 01/20/2021 1114   APPEARANCEUR Clear 09/10/2020 1122   LABSPEC 1.007 01/20/2021 1114   PHURINE 9.0 (H) 01/20/2021 1114   GLUCOSEU NEGATIVE 01/20/2021 1114   HGBUR  SMALL (A) 01/20/2021 1114   BILIRUBINUR NEGATIVE 01/20/2021 1114   BILIRUBINUR Negative 09/10/2020 1122   KETONESUR NEGATIVE 01/20/2021 1114   PROTEINUR >=300 (A) 01/20/2021 1114   NITRITE NEGATIVE 01/20/2021 1114   LEUKOCYTESUR LARGE (A) 01/20/2021 1114   Sepsis Labs: @LABRCNTIP (procalcitonin:4,lacticidven:4) ) Recent Results (from the past 240 hour(s))  Urine Culture     Status: Abnormal   Collection Time: 01/20/21 11:14 AM   Specimen: Urine, Clean Catch  Result Value Ref Range Status   Specimen Description   Final    URINE, CLEAN CATCH Performed at [facility], [address], [place], [place] [address]    Special Requests   Final    NONE Performed at [facility], [address] [place], [place] [address]    Culture >=100,000 COLONIES/mL [facility] MORGANII (A)  Final   Report Status 01/23/2021 FINAL  Final   Organism ID, Bacteria MORGANELLA MORGANII (A)  Final      Susceptibility   Morganella morganii - MIC*    AMPICILLIN >=32 RESISTANT Resistant     CEFAZOLIN >=64 RESISTANT Resistant     CIPROFLOXACIN <=0.25 SENSITIVE Sensitive     GENTAMICIN <=1 SENSITIVE Sensitive     IMIPENEM 1 SENSITIVE Sensitive     NITROFURANTOIN RESISTANT Resistant     TRIMETH/SULFA <=20 SENSITIVE Sensitive     AMPICILLIN/SULBACTAM >=32 RESISTANT Resistant     PIP/TAZO <=4 SENSITIVE Sensitive     * >=100,000 COLONIES/mL MORGANELLA MORGANII     Scheduled Meds:  apixaban  5 mg Oral BID   Chlorhexidine Gluconate Cloth  6 each Topical Q0600   cholecalciferol  1,000 Units Oral Daily   darbepoetin (ARANESP) injection - DIALYSIS  100 mcg Intravenous Q Tue-HD   feeding supplement (GLUCERNA SHAKE)  237 mL Oral BID BM   fluticasone  1 spray Each Nare Daily   heparin sodium (porcine)       insulin aspart  0-9 Units Subcutaneous TID WC   insulin aspart protamine- aspart  5 Units Subcutaneous BID WC   metoprolol succinate  25 mg Oral Q12H   nystatin   Topical TID   polyethylene glycol  17 g Oral  Daily   pravastatin  80 mg Oral QPM   sevelamer carbonate  800 mg Oral BID WC   vitamin B-12  1,000 mcg Oral Daily   vitamin E  800 Units Oral Daily   zinc sulfate  220 mg Oral Daily   Continuous Infusions:  sodium chloride     sodium chloride     albumin human     ceFEPime (MAXIPIME) IV 2 g (01/25/21 1555)   methocarbamol (ROBAXIN) IV      Procedures/Studies: DG Chest Port 1 View  Result Date: 01/14/2021 CLINICAL DATA:  Vascular dialysis catheter in place. EXAM: PORTABLE CHEST 1 VIEW COMPARISON:  Radiograph 01/12/2021. FINDINGS: New right internal jugular central venous catheter tip overlies the lower SVC. No pneumothorax. Stable cardiomegaly. Slight worsening right basilar aeration and blunting of the costophrenic angle, suspicious for increasing pleural effusion and atelectasis. Vascular congestion. IMPRESSION: 1. New right internal jugular central venous catheter with tip overlying the lower SVC. No pneumothorax. 2. Slight worsening right basilar aeration and blunting of the costophrenic angle, suspicious for increasing pleural effusion and atelectasis. Unchanged cardiomegaly. Electronically Signed   By: Keith Rake M.D.   On: 01/14/2021 15:06   DG Chest Portable 1 View  Result Date: 01/12/2021 CLINICAL DATA:  Weakness. EXAM: PORTABLE CHEST 1 VIEW COMPARISON:  January 03, 2021. FINDINGS: Stable cardiomegaly. No pneumothorax or pleural effusion is noted. Stable bibasilar subsegmental atelectasis. Bony thorax is unremarkable. IMPRESSION: Stable bibasilar subsegmental atelectasis. Electronically Signed   By: Marijo Conception M.D.   On: 01/12/2021 12:11   DG Chest Port 1 View  Result Date: 01/03/2021 CLINICAL DATA:  Shortness of breath EXAM: PORTABLE CHEST 1 VIEW COMPARISON:  November 29, 2020 FINDINGS: Stable cardiomegaly. Aortic atherosclerosis. Bibasilar opacities, favor atelectasis. No visible pleural effusion or pneumothorax. The visualized skeletal structures are unchanged. IMPRESSION: 1.  Stable cardiomegaly. 2. Bibasilar opacities, favor atelectasis. 3.  Aortic Atherosclerosis (ICD10-I70.0). Electronically Signed   By: Dahlia Bailiff MD   On: 01/03/2021 18:29   DG C-Arm 1-60 Min-No Report  Result Date: 01/14/2021 Fluoroscopy was utilized by the requesting physician.  No radiographic interpretation.    Orson Eva, DO  [facility]  If 7PM-7AM, please contact night-coverage [email or web address] Password [facility] 01/25/2021, 6:34 PM   LOS: 13 days

## 2021-01-26 DIAGNOSIS — G9341 Metabolic encephalopathy: Secondary | ICD-10-CM | POA: Diagnosis not present

## 2021-01-26 DIAGNOSIS — N186 End stage renal disease: Secondary | ICD-10-CM | POA: Diagnosis not present

## 2021-01-26 DIAGNOSIS — D649 Anemia, unspecified: Secondary | ICD-10-CM | POA: Diagnosis not present

## 2021-01-26 DIAGNOSIS — I5032 Chronic diastolic (congestive) heart failure: Secondary | ICD-10-CM | POA: Diagnosis not present

## 2021-01-26 LAB — GLUCOSE, CAPILLARY
Glucose-Capillary: 96 mg/dL (ref 70–99)
Glucose-Capillary: 108 mg/dL — ABNORMAL HIGH (ref 70–99)
Glucose-Capillary: 135 mg/dL — ABNORMAL HIGH (ref 70–99)
Glucose-Capillary: 119 mg/dL — ABNORMAL HIGH (ref 70–99)

## 2021-01-26 NOTE — Progress Notes (Signed)
Patient ID: Chrisopher Pustejovsky, male   DOB: 05/17/1956, 65 y.o.   MRN: [id number] S: no acute events, no complaints, tolerated HD yesterday as per patient. Net uf 1.9L O:BP (!) 142/92 (BP Location: Left Arm)   Pulse (!) 110   Temp 98.7 F (37.1 C) (Oral)   Resp 20   Ht 6\' 1"  (1.854 m)   Wt 126 kg   SpO2 100%   BMI 36.65 kg/m   Intake/Output Summary (Last 24 hours) at 01/26/2021 0849 Last data filed at 01/26/2021 0705 Gross per 24 hour  Intake 720 ml  Output 2754 ml  Net -2034 ml   Intake/Output: I/O last 3 completed shifts: In: 58 [P.O.:720] Out: 2804 [Urine:900; Other:1904]  Intake/Output this shift:  Total I/O In: -  Out: 650 [Urine:650] Weight change: 0.1 kg Gen:NAD CVS: RRR Resp: CTA Abd: +BS, soft, NT/ND Ext: 1+ pretibial edema bilaterally  Recent Labs  Lab 01/19/21 1705 01/21/21 0630 01/22/21 1122 01/24/21 1952 01/25/21 0542  NA 134*  --  136 132* 136  K 3.7  --  3.8 4.2 4.4  CL 99  --  100 99 101  CO2 29  --  31 27 28   GLUCOSE 79  --  129* 115* 115*  BUN 47*  --  48* 44* 47*  CREATININE 3.55*  --  3.43* 3.24* 3.33*  ALBUMIN 2.4*  --  2.4* 2.5* 2.5*  CALCIUM 7.9* 8.6 8.1* 8.1* 8.7*  PHOS 4.0  --  3.7 3.5 3.5  AST  --   --   --  36  --   ALT  --   --   --  18  --    Liver Function Tests: Recent Labs  Lab 01/22/21 1122 01/24/21 1952 01/25/21 0542  AST  --  36  --   ALT  --  18  --   ALKPHOS  --  242*  --   BILITOT  --  1.0  --   PROT  --  6.5  --   ALBUMIN 2.4* 2.5* 2.5*   No results for input(s): LIPASE, AMYLASE in the last 168 hours. Recent Labs  Lab 01/20/21 1535  AMMONIA 25   CBC: Recent Labs  Lab 01/19/21 1705 01/22/21 1122 01/24/21 1952 01/25/21 0542  WBC 7.2 7.4 8.1 7.9  HGB 8.1* 8.7* 8.3* 8.6*  HCT 26.9* 28.1* 26.9* 28.2*  MCV 89.7 89.8 89.4 89.5  PLT 228 266 231 243   Cardiac Enzymes: No results for input(s): CKTOTAL, CKMB, CKMBINDEX, TROPONINI in the last 168 hours. CBG: Recent Labs  Lab 01/25/21 0717 01/25/21 1112  01/25/21 1613 01/25/21 2118 01/26/21 0731  GLUCAP 121* 132* 109* 127* 96    Iron Studies: No results for input(s): IRON, TIBC, TRANSFERRIN, FERRITIN in the last 72 hours. Studies/Results: No results found.  apixaban  5 mg Oral BID   Chlorhexidine Gluconate Cloth  6 each Topical Q0600   cholecalciferol  1,000 Units Oral Daily   ciprofloxacin  250 mg Oral Q24H   darbepoetin (ARANESP) injection - DIALYSIS  100 mcg Intravenous Q Tue-HD   feeding supplement (GLUCERNA SHAKE)  237 mL Oral BID BM   fluticasone  1 spray Each Nare Daily   insulin aspart  0-9 Units Subcutaneous TID WC   insulin aspart protamine- aspart  5 Units Subcutaneous BID WC   metoprolol succinate  25 mg Oral Q12H   nystatin   Topical TID   polyethylene glycol  17 g Oral Daily   pravastatin  80 mg  Oral QPM   sevelamer carbonate  800 mg Oral BID WC   vitamin B-12  1,000 mcg Oral Daily   vitamin E  800 Units Oral Daily   zinc sulfate  220 mg Oral Daily    BMET    Component Value Date/Time   NA 136 01/25/2021 0542   K 4.4 01/25/2021 0542   CL 101 01/25/2021 0542   CO2 28 01/25/2021 0542   GLUCOSE 115 (H) 01/25/2021 0542   BUN 47 (H) 01/25/2021 0542   CREATININE 3.33 (H) 01/25/2021 0542   CALCIUM 8.7 (L) 01/25/2021 0542   CALCIUM 8.6 01/21/2021 0630   GFRNONAA 19 (L) 01/25/2021 0542   GFRAA 17 (L) 03/30/2020 0620   CBC    Component Value Date/Time   WBC 7.9 01/25/2021 0542   RBC 3.15 (L) 01/25/2021 0542   HGB 8.6 (L) 01/25/2021 0542   HCT 28.2 (L) 01/25/2021 0542   PLT 243 01/25/2021 0542   MCV 89.5 01/25/2021 0542   MCH 27.3 01/25/2021 0542   MCHC 30.5 01/25/2021 0542   RDW 18.6 (H) 01/25/2021 0542   LYMPHSABS 0.7 01/12/2021 1202   MONOABS 0.6 01/12/2021 1202   EOSABS 0.3 01/12/2021 1202   BASOSABS 0.0 01/12/2021 1202     Assessment/Plan:  ESRD - s/p TDC placement 7/22 and HD initiated on 01/15/21, 7/25, 7/27, and 7/30.  For HD on TTS schedule  Symptomatic anemia - on ESA HTN/volume -  improved with UF. CKD BMD - on renvela. DM type 2 - per primary.  Disposition-  awaiting SNF placement and outpatient HD to be arranged at [facility] on TTS schedule.   Gean Quint, MD [facility]

## 2021-01-26 NOTE — Progress Notes (Signed)
Patient c/o burning when urinating in the urethra area. Notified Dr. Roderic Palau and will notify Dr. Candiss Norse.

## 2021-01-26 NOTE — Progress Notes (Signed)
PROGRESS NOTE  Danny [name] URK:[id number] DOB: [birth year] DOA: 01/12/2021 PCP: Celene Squibb, MD  Brief History:  As per H&P written by Dr. Marily Memos on 01/12/2021 Danny [name] is a 65 y.o. male with medical history significant of Anemia of Chronic disease, CKD stage 5, DM, Chronic LE ulcers from venous stasis dermatitis and DM, HTN, Urinary retention. Pt states he has been an overall physical decline for several days w/o acute complaint. On day of admission pt unable to ambulate from bed and care for self. Profound generalized weakness. Denies CP, SOB, palpitations, ABD pain, dysuria, Frequency, rash, n/v, HA, fever, Cough, hematochezia, melena. Still on Augmentin for Bronchitis that has essentially resolved per pt. Started on 01/04/21. Leg wounds (chronic) were wrapped by home health aid on 01/10/21 per pt. No change in medication recenlyt and endorses compliance.    ED Course: Objective findings below. Started on 2 units PRBC infusion   Assessment/Plan: stage V CKD>>New ESRD -General surgery has placed dialysis catheter 7/22; -hemodialysis treatment initiation on 01/15/2021. -Second dialysis treatment well-tolerated on 01/17/2021 -third dialysis treatment on 01/19/2021 -had HD on 7/30, 8/2 -Continue to follow renal diet, strict I's and O's and daily weight. -outpatient chair TTS schedule obtained   Acute Respiratory Failure with Hypoxia -due to fluid overload -stable on 2L -wean to RA for saturation > 92% -fluid removal on HD   Acute Metabolic Encephalopathy -pt has had cognitive and functional decline over last 6-12 months -multifactorial including renal failure, anemia, UTI -7/28 UA >50 WBC -check ammonia--25 -check VBG--7.405/48/54 on 2L -B12--3890 -TSH--2.433 -started empiric ceftriaxone pending cultures>>cefepime -01/25/21 mental status at baseline    Acute UTI -7/28 UA >50WBC -7/28 urine culture Morganella -discontinue ceftriaxone -start cefepime 7/31>>cipro po on 8/3    Symptomatic anemia -And with underlying history of a stage V renal failure and anemia of chronic kidney disease. -Hemoglobin down to 7.0 causing symptomatic anemia. -2 units PRBCs have been given; posttransfusion Hgb remains largely stable -Hemoglobin has remained stable post transfusion -Will continue to follow hemoglobin trend intermittently. -Transfusion threshold is for hemoglobin less than 7. -IV iron and Epogen therapy as per renal function discretion. -No overt bleeding appreciated.   Diabetes mellitus type 2 -follow CBG's and further adjust hypoglycemic regimen accordingly. -continue SSI -decreased 70/30 -7/24 A1C--6.8   Chronic diastolic CHF -Low-sodium diet encouraged. -Patient in need of dialysis initiation -Volume to be managed with dialysis treatment. -Continue to follow daily weights and strict I's and O's.   secondary hyperparathyroidism -continue the use of Renvela - follow nephrology further recommendations -Follow electrolytes stability.     essential hypertension -BP is soft but improving -HD treatment helping volume and BP   hyperlipidemia -Continue statins. -Heart healthy diet encouraged.   Venous stasis ulcers -has Unna boots -appreciate wound care recommendations>>> Daily cleanse with NS, pat dry. Cover wound with silver hydrofiber (Aquacel Ag+ Advantage), top with dry dressing, ABD and secure with Kerlix/paper tape.  Place foot into [employer].  May moisten with NS, as needed for removal   Atrial Fibrillation, type unspecified --5/7 Echo EF 55-60%; mild decrease RV fxn, RVSP 61.5 -continue metoprolol succinate -continue apixaban   Goals of Care -long discussion with sister and brother 7/28 -Advance care planning, including the explanation and discussion of advance directives was carried out with the patient and family.  Code status including explanations of "Full Code" and "DNR" and alternatives were discussed in detail.  Discussion of  end-of-life issues including  but not limited palliative care, hospice care and the concept of hospice, other end-of-life care options, power of attorney for health care decisions, living wills, and physician orders for life-sustaining treatment were also discussed with the patient and family.  Total face to face time 20 minutes. -change to DNR -if no change cognitively and functionally in next 2-3 weeks with HD and STR, then would consider residential hospice       Status is: Inpatient   Remains inpatient appropriate because:IV treatments appropriate due to intensity of illness or inability to take PO   Dispo: The patient is from: Home              Anticipated d/c is to: SNF              Patient currently is not medically stable to d/c.              Difficult to place patient No               Family Communication: brother and sister updated 7/31   Consultants:  renal   Code Status:  FULL   DVT Prophylaxis:  apixaban     Procedures: As Listed in Progress Note Above   Antibiotics: Ceftriaxone 7/28>>7/30 Cefepime 7/31>>8/2 Ciprofloxacin 8/2>    Subjective: No shortness of breath or chest pain. No nausea or vomiting.   Objective: Vitals:   01/25/21 2035 01/25/21 2116 01/26/21 0522 01/26/21 1458  BP: 120/72 129/80 (!) 142/92 118/68  Pulse: (!) 110 (!) 105 (!) 110 98  Resp: 16 20 20 18   Temp: 97.9 F (36.6 C) 98 F (36.7 C) 98.7 F (37.1 C) 97.8 F (36.6 C)  TempSrc: Oral Oral Oral Oral  SpO2: 100% 100% 100% 100%  Weight:   126 kg   Height:        Intake/Output Summary (Last 24 hours) at 01/26/2021 1805 Last data filed at 01/26/2021 0705 Gross per 24 hour  Intake 0 ml  Output 2554 ml  Net -2554 ml   Weight change: 0.1 kg Exam:  General:  Pt is alert, follows commands appropriately, not in acute distress HEENT: No icterus, No thrush, No neck mass, [place]/AT Cardiovascular: RRR, S1/S2, no rubs, no gallops Respiratory: bibasilar rales. No wheeze Abdomen:  Soft/+BS, non tender, non distended, no guarding Extremities: 1 + LE edema, No lymphangitis, No petechiae, No rashes, no synovitis   Data Reviewed: I have personally reviewed following labs and imaging studies Basic Metabolic Panel: Recent Labs  Lab 01/21/21 0630 01/22/21 1122 01/24/21 1952 01/25/21 0542  NA  --  136 132* 136  K  --  3.8 4.2 4.4  CL  --  100 99 101  CO2  --  31 27 28   GLUCOSE  --  129* 115* 115*  BUN  --  48* 44* 47*  CREATININE  --  3.43* 3.24* 3.33*  CALCIUM 8.6 8.1* 8.1* 8.7*  MG  --   --  2.1  --   PHOS  --  3.7 3.5 3.5   Liver Function Tests: Recent Labs  Lab 01/22/21 1122 01/24/21 1952 01/25/21 0542  AST  --  36  --   ALT  --  18  --   ALKPHOS  --  242*  --   BILITOT  --  1.0  --   PROT  --  6.5  --   ALBUMIN 2.4* 2.5* 2.5*   No results for input(s): LIPASE, AMYLASE in the last 168 hours. Recent Labs  Lab 01/20/21 1535  AMMONIA 25   Coagulation Profile: No results for input(s): INR, PROTIME in the last 168 hours. CBC: Recent Labs  Lab 01/22/21 1122 01/24/21 1952 01/25/21 0542  WBC 7.4 8.1 7.9  HGB 8.7* 8.3* 8.6*  HCT 28.1* 26.9* 28.2*  MCV 89.8 89.4 89.5  PLT 266 231 243   Cardiac Enzymes: No results for input(s): CKTOTAL, CKMB, CKMBINDEX, TROPONINI in the last 168 hours. BNP: Invalid input(s): POCBNP CBG: Recent Labs  Lab 01/25/21 1613 01/25/21 2118 01/26/21 0731 01/26/21 1116 01/26/21 1611  GLUCAP 109* 127* 96 108* 135*   HbA1C: No results for input(s): HGBA1C in the last 72 hours. Urine analysis:    Component Value Date/Time   COLORURINE AMBER (A) 01/20/2021 1114   APPEARANCEUR TURBID (A) 01/20/2021 1114   APPEARANCEUR Clear 09/10/2020 1122   LABSPEC 1.007 01/20/2021 1114   PHURINE 9.0 (H) 01/20/2021 1114   GLUCOSEU NEGATIVE 01/20/2021 1114   HGBUR SMALL (A) 01/20/2021 1114   BILIRUBINUR NEGATIVE 01/20/2021 1114   BILIRUBINUR Negative 09/10/2020 1122   KETONESUR NEGATIVE 01/20/2021 1114   PROTEINUR >=300  (A) 01/20/2021 1114   NITRITE NEGATIVE 01/20/2021 1114   LEUKOCYTESUR LARGE (A) 01/20/2021 1114   Sepsis Labs: @LABRCNTIP (procalcitonin:4,lacticidven:4) ) Recent Results (from the past 240 hour(s))  Urine Culture     Status: Abnormal   Collection Time: 01/20/21 11:14 AM   Specimen: Urine, Clean Catch  Result Value Ref Range Status   Specimen Description   Final    URINE, CLEAN CATCH Performed at [facility], [address] [place], [place] [address]    Special Requests   Final    NONE Performed at [facility], [address], [place], [place] [address]    Culture >=100,000 COLONIES/mL [facility] MORGANII (A)  Final   Report Status 01/23/2021 FINAL  Final   Organism ID, Bacteria MORGANELLA MORGANII (A)  Final      Susceptibility   Morganella morganii - MIC*    AMPICILLIN >=32 RESISTANT Resistant     CEFAZOLIN >=64 RESISTANT Resistant     CIPROFLOXACIN <=0.25 SENSITIVE Sensitive     GENTAMICIN <=1 SENSITIVE Sensitive     IMIPENEM 1 SENSITIVE Sensitive     NITROFURANTOIN RESISTANT Resistant     TRIMETH/SULFA <=20 SENSITIVE Sensitive     AMPICILLIN/SULBACTAM >=32 RESISTANT Resistant     PIP/TAZO <=4 SENSITIVE Sensitive     * >=100,000 COLONIES/mL MORGANELLA MORGANII     Scheduled Meds:  apixaban  5 mg Oral BID   Chlorhexidine Gluconate Cloth  6 each Topical Q0600   cholecalciferol  1,000 Units Oral Daily   ciprofloxacin  250 mg Oral Q24H   darbepoetin (ARANESP) injection - DIALYSIS  100 mcg Intravenous Q Tue-HD   feeding supplement (GLUCERNA SHAKE)  237 mL Oral BID BM   fluticasone  1 spray Each Nare Daily   insulin aspart  0-9 Units Subcutaneous TID WC   insulin aspart protamine- aspart  5 Units Subcutaneous BID WC   metoprolol succinate  25 mg Oral Q12H   nystatin   Topical TID   polyethylene glycol  17 g Oral Daily   pravastatin  80 mg Oral QPM   sevelamer carbonate  800 mg Oral BID WC   vitamin B-12  1,000 mcg Oral Daily   vitamin E  800 Units Oral Daily   zinc  sulfate  220 mg Oral Daily   Continuous Infusions:  sodium chloride     sodium chloride     methocarbamol (ROBAXIN) IV  Procedures/Studies: DG Chest Port 1 View  Result Date: 01/14/2021 CLINICAL DATA:  Vascular dialysis catheter in place. EXAM: PORTABLE CHEST 1 VIEW COMPARISON:  Radiograph 01/12/2021. FINDINGS: New right internal jugular central venous catheter tip overlies the lower SVC. No pneumothorax. Stable cardiomegaly. Slight worsening right basilar aeration and blunting of the costophrenic angle, suspicious for increasing pleural effusion and atelectasis. Vascular congestion. IMPRESSION: 1. New right internal jugular central venous catheter with tip overlying the lower SVC. No pneumothorax. 2. Slight worsening right basilar aeration and blunting of the costophrenic angle, suspicious for increasing pleural effusion and atelectasis. Unchanged cardiomegaly. Electronically Signed   By: Keith Rake M.D.   On: 01/14/2021 15:06   DG Chest Portable 1 View  Result Date: 01/12/2021 CLINICAL DATA:  Weakness. EXAM: PORTABLE CHEST 1 VIEW COMPARISON:  January 03, 2021. FINDINGS: Stable cardiomegaly. No pneumothorax or pleural effusion is noted. Stable bibasilar subsegmental atelectasis. Bony thorax is unremarkable. IMPRESSION: Stable bibasilar subsegmental atelectasis. Electronically Signed   By: Marijo Conception M.D.   On: 01/12/2021 12:11   DG Chest Port 1 View  Result Date: 01/03/2021 CLINICAL DATA:  Shortness of breath EXAM: PORTABLE CHEST 1 VIEW COMPARISON:  November 29, 2020 FINDINGS: Stable cardiomegaly. Aortic atherosclerosis. Bibasilar opacities, favor atelectasis. No visible pleural effusion or pneumothorax. The visualized skeletal structures are unchanged. IMPRESSION: 1. Stable cardiomegaly. 2. Bibasilar opacities, favor atelectasis. 3.  Aortic Atherosclerosis (ICD10-I70.0). Electronically Signed   By: Dahlia Bailiff MD   On: 01/03/2021 18:29   DG C-Arm 1-60 Min-No Report  Result Date:  01/14/2021 Fluoroscopy was utilized by the requesting physician.  No radiographic interpretation.    Kathie Dike, MD  [facility]  If 7PM-7AM, please contact night-coverage [email or web address]  01/26/2021, 6:05 PM   LOS: 14 days

## 2021-01-27 DIAGNOSIS — Z794 Long term (current) use of insulin: Secondary | ICD-10-CM | POA: Diagnosis not present

## 2021-01-27 DIAGNOSIS — I4891 Unspecified atrial fibrillation: Secondary | ICD-10-CM | POA: Diagnosis not present

## 2021-01-27 DIAGNOSIS — R140 Abdominal distension (gaseous): Secondary | ICD-10-CM | POA: Diagnosis not present

## 2021-01-27 DIAGNOSIS — E1129 Type 2 diabetes mellitus with other diabetic kidney complication: Secondary | ICD-10-CM | POA: Diagnosis not present

## 2021-01-27 DIAGNOSIS — Z992 Dependence on renal dialysis: Secondary | ICD-10-CM | POA: Diagnosis not present

## 2021-01-27 DIAGNOSIS — H66003 Acute suppurative otitis media without spontaneous rupture of ear drum, bilateral: Secondary | ICD-10-CM | POA: Diagnosis not present

## 2021-01-27 DIAGNOSIS — N183 Chronic kidney disease, stage 3 unspecified: Secondary | ICD-10-CM | POA: Diagnosis not present

## 2021-01-27 DIAGNOSIS — R972 Elevated prostate specific antigen [PSA]: Secondary | ICD-10-CM | POA: Diagnosis not present

## 2021-01-27 DIAGNOSIS — Z23 Encounter for immunization: Secondary | ICD-10-CM | POA: Diagnosis not present

## 2021-01-27 DIAGNOSIS — Z743 Need for continuous supervision: Secondary | ICD-10-CM | POA: Diagnosis not present

## 2021-01-27 DIAGNOSIS — R2242 Localized swelling, mass and lump, left lower limb: Secondary | ICD-10-CM | POA: Diagnosis not present

## 2021-01-27 DIAGNOSIS — E119 Type 2 diabetes mellitus without complications: Secondary | ICD-10-CM | POA: Diagnosis not present

## 2021-01-27 DIAGNOSIS — I259 Chronic ischemic heart disease, unspecified: Secondary | ICD-10-CM | POA: Diagnosis not present

## 2021-01-27 DIAGNOSIS — L89513 Pressure ulcer of right ankle, stage 3: Secondary | ICD-10-CM | POA: Diagnosis not present

## 2021-01-27 DIAGNOSIS — E559 Vitamin D deficiency, unspecified: Secondary | ICD-10-CM | POA: Diagnosis not present

## 2021-01-27 DIAGNOSIS — T8249XA Other complication of vascular dialysis catheter, initial encounter: Secondary | ICD-10-CM | POA: Diagnosis not present

## 2021-01-27 DIAGNOSIS — E1122 Type 2 diabetes mellitus with diabetic chronic kidney disease: Secondary | ICD-10-CM | POA: Diagnosis not present

## 2021-01-27 DIAGNOSIS — I129 Hypertensive chronic kidney disease with stage 1 through stage 4 chronic kidney disease, or unspecified chronic kidney disease: Secondary | ICD-10-CM | POA: Diagnosis not present

## 2021-01-27 DIAGNOSIS — J90 Pleural effusion, not elsewhere classified: Secondary | ICD-10-CM | POA: Diagnosis not present

## 2021-01-27 DIAGNOSIS — T8242XA Displacement of vascular dialysis catheter, initial encounter: Secondary | ICD-10-CM | POA: Diagnosis not present

## 2021-01-27 DIAGNOSIS — E785 Hyperlipidemia, unspecified: Secondary | ICD-10-CM | POA: Diagnosis not present

## 2021-01-27 DIAGNOSIS — E1140 Type 2 diabetes mellitus with diabetic neuropathy, unspecified: Secondary | ICD-10-CM | POA: Diagnosis not present

## 2021-01-27 DIAGNOSIS — R5381 Other malaise: Secondary | ICD-10-CM | POA: Diagnosis not present

## 2021-01-27 DIAGNOSIS — I872 Venous insufficiency (chronic) (peripheral): Secondary | ICD-10-CM | POA: Diagnosis not present

## 2021-01-27 DIAGNOSIS — N138 Other obstructive and reflux uropathy: Secondary | ICD-10-CM | POA: Diagnosis not present

## 2021-01-27 DIAGNOSIS — D649 Anemia, unspecified: Secondary | ICD-10-CM | POA: Diagnosis not present

## 2021-01-27 DIAGNOSIS — Z043 Encounter for examination and observation following other accident: Secondary | ICD-10-CM | POA: Diagnosis not present

## 2021-01-27 DIAGNOSIS — E1169 Type 2 diabetes mellitus with other specified complication: Secondary | ICD-10-CM | POA: Diagnosis not present

## 2021-01-27 DIAGNOSIS — R69 Illness, unspecified: Secondary | ICD-10-CM | POA: Diagnosis not present

## 2021-01-27 DIAGNOSIS — L089 Local infection of the skin and subcutaneous tissue, unspecified: Secondary | ICD-10-CM | POA: Diagnosis not present

## 2021-01-27 DIAGNOSIS — G8911 Acute pain due to trauma: Secondary | ICD-10-CM | POA: Diagnosis not present

## 2021-01-27 DIAGNOSIS — M19071 Primary osteoarthritis, right ankle and foot: Secondary | ICD-10-CM | POA: Diagnosis not present

## 2021-01-27 DIAGNOSIS — I739 Peripheral vascular disease, unspecified: Secondary | ICD-10-CM | POA: Diagnosis not present

## 2021-01-27 DIAGNOSIS — J9601 Acute respiratory failure with hypoxia: Secondary | ICD-10-CM | POA: Diagnosis not present

## 2021-01-27 DIAGNOSIS — L03115 Cellulitis of right lower limb: Secondary | ICD-10-CM | POA: Diagnosis not present

## 2021-01-27 DIAGNOSIS — D631 Anemia in chronic kidney disease: Secondary | ICD-10-CM | POA: Diagnosis not present

## 2021-01-27 DIAGNOSIS — R279 Unspecified lack of coordination: Secondary | ICD-10-CM | POA: Diagnosis not present

## 2021-01-27 DIAGNOSIS — E669 Obesity, unspecified: Secondary | ICD-10-CM | POA: Diagnosis not present

## 2021-01-27 DIAGNOSIS — I5032 Chronic diastolic (congestive) heart failure: Secondary | ICD-10-CM | POA: Diagnosis not present

## 2021-01-27 DIAGNOSIS — L89153 Pressure ulcer of sacral region, stage 3: Secondary | ICD-10-CM | POA: Diagnosis not present

## 2021-01-27 DIAGNOSIS — I10 Essential (primary) hypertension: Secondary | ICD-10-CM | POA: Diagnosis not present

## 2021-01-27 DIAGNOSIS — D472 Monoclonal gammopathy: Secondary | ICD-10-CM | POA: Diagnosis not present

## 2021-01-27 DIAGNOSIS — R600 Localized edema: Secondary | ICD-10-CM | POA: Diagnosis not present

## 2021-01-27 DIAGNOSIS — R339 Retention of urine, unspecified: Secondary | ICD-10-CM | POA: Diagnosis not present

## 2021-01-27 DIAGNOSIS — Z79899 Other long term (current) drug therapy: Secondary | ICD-10-CM | POA: Diagnosis not present

## 2021-01-27 DIAGNOSIS — M25571 Pain in right ankle and joints of right foot: Secondary | ICD-10-CM | POA: Diagnosis not present

## 2021-01-27 DIAGNOSIS — Z7401 Bed confinement status: Secondary | ICD-10-CM | POA: Diagnosis not present

## 2021-01-27 DIAGNOSIS — D508 Other iron deficiency anemias: Secondary | ICD-10-CM | POA: Diagnosis not present

## 2021-01-27 DIAGNOSIS — E538 Deficiency of other specified B group vitamins: Secondary | ICD-10-CM | POA: Diagnosis not present

## 2021-01-27 DIAGNOSIS — G9341 Metabolic encephalopathy: Secondary | ICD-10-CM | POA: Diagnosis not present

## 2021-01-27 DIAGNOSIS — Z1159 Encounter for screening for other viral diseases: Secondary | ICD-10-CM | POA: Diagnosis not present

## 2021-01-27 DIAGNOSIS — Z5181 Encounter for therapeutic drug level monitoring: Secondary | ICD-10-CM | POA: Diagnosis not present

## 2021-01-27 DIAGNOSIS — R079 Chest pain, unspecified: Secondary | ICD-10-CM | POA: Diagnosis not present

## 2021-01-27 DIAGNOSIS — G8929 Other chronic pain: Secondary | ICD-10-CM | POA: Diagnosis not present

## 2021-01-27 DIAGNOSIS — R0789 Other chest pain: Secondary | ICD-10-CM | POA: Diagnosis not present

## 2021-01-27 DIAGNOSIS — N401 Enlarged prostate with lower urinary tract symptoms: Secondary | ICD-10-CM | POA: Diagnosis not present

## 2021-01-27 DIAGNOSIS — R52 Pain, unspecified: Secondary | ICD-10-CM | POA: Diagnosis not present

## 2021-01-27 DIAGNOSIS — R109 Unspecified abdominal pain: Secondary | ICD-10-CM | POA: Diagnosis not present

## 2021-01-27 DIAGNOSIS — R053 Chronic cough: Secondary | ICD-10-CM | POA: Diagnosis not present

## 2021-01-27 DIAGNOSIS — N184 Chronic kidney disease, stage 4 (severe): Secondary | ICD-10-CM | POA: Diagnosis not present

## 2021-01-27 DIAGNOSIS — E1121 Type 2 diabetes mellitus with diabetic nephropathy: Secondary | ICD-10-CM | POA: Diagnosis not present

## 2021-01-27 DIAGNOSIS — R2689 Other abnormalities of gait and mobility: Secondary | ICD-10-CM | POA: Diagnosis not present

## 2021-01-27 DIAGNOSIS — I120 Hypertensive chronic kidney disease with stage 5 chronic kidney disease or end stage renal disease: Secondary | ICD-10-CM | POA: Diagnosis not present

## 2021-01-27 DIAGNOSIS — L98492 Non-pressure chronic ulcer of skin of other sites with fat layer exposed: Secondary | ICD-10-CM | POA: Diagnosis not present

## 2021-01-27 DIAGNOSIS — R41841 Cognitive communication deficit: Secondary | ICD-10-CM | POA: Diagnosis not present

## 2021-01-27 DIAGNOSIS — M6281 Muscle weakness (generalized): Secondary | ICD-10-CM | POA: Diagnosis not present

## 2021-01-27 DIAGNOSIS — R0902 Hypoxemia: Secondary | ICD-10-CM | POA: Diagnosis not present

## 2021-01-27 DIAGNOSIS — I509 Heart failure, unspecified: Secondary | ICD-10-CM | POA: Diagnosis not present

## 2021-01-27 DIAGNOSIS — E213 Hyperparathyroidism, unspecified: Secondary | ICD-10-CM | POA: Diagnosis not present

## 2021-01-27 DIAGNOSIS — I132 Hypertensive heart and chronic kidney disease with heart failure and with stage 5 chronic kidney disease, or end stage renal disease: Secondary | ICD-10-CM | POA: Diagnosis not present

## 2021-01-27 DIAGNOSIS — Z87891 Personal history of nicotine dependence: Secondary | ICD-10-CM | POA: Diagnosis not present

## 2021-01-27 DIAGNOSIS — Z7901 Long term (current) use of anticoagulants: Secondary | ICD-10-CM | POA: Diagnosis not present

## 2021-01-27 DIAGNOSIS — I5030 Unspecified diastolic (congestive) heart failure: Secondary | ICD-10-CM | POA: Diagnosis not present

## 2021-01-27 DIAGNOSIS — D509 Iron deficiency anemia, unspecified: Secondary | ICD-10-CM | POA: Diagnosis not present

## 2021-01-27 DIAGNOSIS — R739 Hyperglycemia, unspecified: Secondary | ICD-10-CM | POA: Diagnosis not present

## 2021-01-27 DIAGNOSIS — N185 Chronic kidney disease, stage 5: Secondary | ICD-10-CM | POA: Diagnosis not present

## 2021-01-27 DIAGNOSIS — N492 Inflammatory disorders of scrotum: Secondary | ICD-10-CM | POA: Diagnosis not present

## 2021-01-27 DIAGNOSIS — K5900 Constipation, unspecified: Secondary | ICD-10-CM | POA: Diagnosis not present

## 2021-01-27 DIAGNOSIS — N186 End stage renal disease: Secondary | ICD-10-CM | POA: Diagnosis not present

## 2021-01-27 LAB — GLUCOSE, CAPILLARY
Glucose-Capillary: 110 mg/dL — ABNORMAL HIGH (ref 70–99)
Glucose-Capillary: 144 mg/dL — ABNORMAL HIGH (ref 70–99)

## 2021-01-27 MED ORDER — ALBUMIN HUMAN 25 % IV SOLN
INTRAVENOUS | Status: AC
  Administered 2021-01-27: 25 g
  Filled 2021-01-27: qty 100

## 2021-01-27 MED ORDER — APIXABAN 5 MG PO TABS: 5.0000 mg | ORAL_TABLET | Freq: Two times a day (BID) | ORAL | Status: DC

## 2021-01-27 MED ORDER — LACTULOSE 10 GM/15ML PO SOLN: 30.0000 g | Freq: Every day | ORAL | Status: DC

## 2021-01-27 MED ORDER — POLYETHYLENE GLYCOL 3350 17 G PO PACK: 17.0000 g | PACK | Freq: Every day | ORAL | 0 refills | Status: DC

## 2021-01-27 MED ORDER — NOVOLIN 70/30 RELION (70-30) 100 UNIT/ML ~~LOC~~ SUSP: 5.0000 [IU] | Freq: Two times a day (BID) | SUBCUTANEOUS | 11 refills | Status: DC

## 2021-01-27 MED ORDER — CIPROFLOXACIN HCL 250 MG PO TABS: 250.0000 mg | ORAL_TABLET | ORAL | 0 refills | Status: AC

## 2021-01-27 MED ORDER — METOPROLOL SUCCINATE ER 25 MG PO TB24: 25.0000 mg | ORAL_TABLET | Freq: Two times a day (BID) | ORAL | Status: DC

## 2021-01-27 MED ORDER — SEVELAMER CARBONATE 800 MG PO TABS: 800.0000 mg | ORAL_TABLET | Freq: Two times a day (BID) | ORAL | Status: DC

## 2021-01-27 NOTE — Procedures (Signed)
   HEMODIALYSIS TREATMENT NOTE:  Uneventful 3.5 hour heparin-free treatment completed via RIJ TDC.  Dressing changed; exit site unremarkable.  Albumin 25g given x1 for asymptomatic hypotension.  Goal met: 2 liters removed.  All blood was returned.  No changes from pre-HD assessment.  Rockwell Alexandria, RN

## 2021-01-27 NOTE — TOC Transition Note (Signed)
Transition of Care [facility]) - CM/SW Discharge Note   Patient Details  Name: Danny [name] MRN: [id number] Date of Birth: August 06, 1955  Transition of Care [facility]) CM/SW Contact:  Boneta Lucks, RN Phone Number: 01/27/2021, 12:33 PM   Clinical Narrative:   Received INS Auth. Patient is getting dialysis currently. He should be back in his room at 3:30pm,  EMS scheduled for 4PM. Medical necessity printed. RN updated. Ingrid screened out for [employer].  Final next level of care: Skilled Nursing Facility Barriers to Discharge: Barriers Resolved   Patient Goals and CMS Choice Patient states their goals for this hospitalization and ongoing recovery are:: to go to SNF. CMS Medicare.gov Compare Post Acute Care list provided to:: Patient   Discharge Placement             Patient chooses bed at:  [facility]) Patient to be transferred to facility by: EMS   Patient and family notified of of transfer: 01/27/21  Discharge Plan and Services    Readmission Risk Interventions Readmission Risk Prevention Plan 01/27/2021 01/13/2021 11/05/2020  Transportation Screening Complete Complete -  PCP or Specialist Appt within 3-5 Days - - Complete  Not Complete comments - - -  HRI or [place] Work Consult for [place] Planning/Counseling - - -  Palliative Care Screening - - -  Medication Review Press photographer) Complete Complete -  PCP or Specialist appointment within 3-5 days of discharge Complete - -  [place] or Home Care Consult Complete Complete -  SW Recovery Care/Counseling Consult Complete Complete -  Palliative Care Screening Not Complete Not Complete -  Skilled Nursing Facility Complete Complete -  Some recent data might be hidden

## 2021-01-27 NOTE — Discharge Summary (Signed)
Physician Discharge Summary  Danny [name] IEP:[id number] DOB: [birth year] DOA: 01/12/2021  PCP: Celene Squibb, MD  Admit date: 01/12/2021 Discharge date: 01/27/2021  Admitted From: home Disposition:  SNF  Recommendations for Outpatient Follow-up:  Follow up with PCP in 1-2 weeks Please obtain BMP/CBC in one week Patient has been set up with [facility] Will need outpatient [facility] referral for permanent HD access   Discharge Condition:stable CODE STATUS:DNR Diet recommendation: heart healthy, carb modified  Brief/Interim Summary: As per H&P written by Dr. Marily Memos on 01/12/2021 Danny [name] is a 65 y.o. male with medical history significant of Anemia of Chronic disease, CKD stage 5, DM, Chronic LE ulcers from venous stasis dermatitis and DM, HTN, Urinary retention. Pt states he has been an overall physical decline for several days w/o acute complaint. On day of admission pt unable to ambulate from bed and care for self. Profound generalized weakness. Denies CP, SOB, palpitations, ABD pain, dysuria, Frequency, rash, n/v, HA, fever, Cough, hematochezia, melena. Still on Augmentin for Bronchitis that has essentially resolved per pt. Started on 01/04/21. Leg wounds (chronic) were wrapped by home health aid on 01/10/21 per pt. No change in medication recenlyt and endorses compliance.   Discharge Diagnoses:  Principal Problem:   Symptomatic anemia Active Problems:   Diabetes mellitus (HCC)   Chronic diastolic CHF (congestive heart failure) (HCC)   Bilateral cellulitis of lower leg   Urinary retention   Hypertension associated with stage 3 chronic kidney disease due to type 2 diabetes mellitus (HCC)   Acute on chronic anemia   End stage renal disease (HCC)   Goals of care, counseling/discussion   Acute metabolic encephalopathy  stage V CKD>>New ESRD -General surgery has placed dialysis catheter 7/22; -hemodialysis treatment initiation on 01/15/2021. -Second dialysis treatment well-tolerated  on 01/17/2021 -third dialysis treatment on 01/19/2021 -had HD on 7/30, 8/2 -Continue to follow renal diet, strict I's and O's and daily weight. -outpatient chair TTS schedule obtained   Acute Respiratory Failure with Hypoxia -due to fluid overload -stable on 2L -wean to RA for saturation > 92% -fluid removal on HD   Acute Metabolic Encephalopathy -pt has had cognitive and functional decline over last 6-12 months -multifactorial including renal failure, anemia, UTI -7/28 UA >50 WBC -check ammonia--25 -check VBG--7.405/48/54 on 2L -B12--3890 -TSH--2.433 -started empiric ceftriaxone pending cultures>>cefepime -01/25/21 mental status at baseline   Acute UTI -7/28 UA >50WBC -7/28 urine culture Morganella -discontinue ceftriaxone -start cefepime 7/31>>cipro po on 8/3   Symptomatic anemia -And with underlying history of a stage V renal failure and anemia of chronic kidney disease. -Hemoglobin down to 7.0 causing symptomatic anemia. -2 units PRBCs have been given; posttransfusion Hgb remains largely stable -Hemoglobin has remained stable post transfusion -Will continue to follow hemoglobin trend intermittently. -Transfusion threshold is for hemoglobin less than 7. -IV iron and Epogen therapy as per renal function discretion. -No overt bleeding appreciated.   Diabetes mellitus type 2 -follow CBG's and further adjust hypoglycemic regimen accordingly. -continue SSI -decreased 70/30 -7/24 A1C--6.8   Chronic diastolic CHF -Low-sodium diet encouraged. -Patient in need of dialysis initiation -Volume to be managed with dialysis treatment. -Continue to follow daily weights and strict I's and O's.   secondary hyperparathyroidism -continue the use of Renvela - follow nephrology further recommendations -Follow electrolytes stability.     essential hypertension -BP is soft but improving -HD treatment helping volume and BP   hyperlipidemia -Continue statins. -Heart healthy diet  encouraged.   Venous stasis ulcers -has Unna boots -  appreciate wound care recommendations>>> Daily cleanse with NS, pat dry. Cover wound with silver hydrofiber (Aquacel Ag+ Advantage), top with dry dressing, ABD and secure with Kerlix/paper tape.  Place foot into [employer].  May moisten with NS, as needed for removal   Atrial Fibrillation, type unspecified --5/7 Echo EF 55-60%; mild decrease RV fxn, RVSP 61.5 -continue metoprolol succinate -continue apixaban   Goals of Care -long discussion by Dr. Carles Collet with sister and brother 7/28 -Advance care planning, including the explanation and discussion of advance directives was carried out with the patient and family.  Code status including explanations of "Full Code" and "DNR" and alternatives were discussed in detail.  Discussion of end-of-life issues including but not limited palliative care, hospice care and the concept of hospice, other end-of-life care options, power of attorney for health care decisions, living wills, and physician orders for life-sustaining treatment were also discussed with the patient and family.   -changed to DNR -if no change cognitively and functionally in next 2-3 weeks with HD and STR, then would consider residential hospice  Discharge Instructions  Discharge Instructions     Diet - low sodium heart healthy   Complete by: As directed    Discharge wound care:   Complete by: As directed    Wound care to right lateral malleolus full thickness wound, chronic, nonhealing (POA):  Cleanse with NS, pat gently dry. Cover with silver hydrofiber (Aquacel Ag+ Advantage, Lawson # [id number]). Top with dry gauze 4x4s, ABD pad and secure with Kerlix roll gauze/paper tape.  Place foot into [employer]. Note:  May Re moisten with NS for ease in removal if adherent.   Increase activity slowly   Complete by: As directed       Allergies as of 01/27/2021       Reactions   Dust Mite Extract Itching, Other (See Comments)   Unknown  reaction-potential shortness of breath   Prednisone Nausea And Vomiting   Rocephin [ceftriaxone] Nausea And Vomiting        Medication List     STOP taking these medications    amLODipine 10 MG tablet Commonly known as: NORVASC   amoxicillin-clavulanate 875-125 MG tablet Commonly known as: Augmentin   aspirin 325 MG tablet   hydrALAZINE 100 MG tablet Commonly known as: APRESOLINE   torsemide 100 MG tablet Commonly known as: DEMADEX       TAKE these medications    acetaminophen 500 MG tablet Commonly known as: TYLENOL Take 1,000 mg by mouth every 6 (six) hours as needed for moderate pain or headache.   apixaban 5 MG Tabs tablet Commonly known as: ELIQUIS Take 1 tablet (5 mg total) by mouth 2 (two) times daily.   cholecalciferol 25 MCG (1000 UNIT) tablet Commonly known as: VITAMIN D3 Take 1,000 Units by mouth daily.   ciprofloxacin 250 MG tablet Commonly known as: CIPRO Take 1 tablet (250 mg total) by mouth daily for 4 days.   collagenase ointment Commonly known as: SANTYL Apply to right lateral malleolus once daily; apply in a 1/8 inch layer and top with saline moistened gauze 2X2.   feeding supplement (NEPRO CARB STEADY) Liqd Take 237 mLs by mouth 2 (two) times daily between meals.   ferrous sulfate 325 (65 FE) MG tablet Take 325 mg by mouth 2 (two) times daily with a meal.   FISH OIL PO Take 2,400 mg by mouth 2 (two) times daily.   Lactulose 20 GM/30ML Soln Take 30 mLs (20 g total) by mouth  daily.   metoprolol succinate 25 MG 24 hr tablet Commonly known as: TOPROL-XL Take 1 tablet (25 mg total) by mouth in the morning and at bedtime. What changed: when to take this   multivitamin with minerals Tabs tablet Take 1 tablet by mouth daily.   NovoLIN 70/30 ReliOn (70-30) 100 UNIT/ML injection Generic drug: insulin NPH-regular Human Inject 5 Units into the skin in the morning and at bedtime. What changed: how much to take   polyethylene glycol 17  g packet Commonly known as: MIRALAX / GLYCOLAX Take 17 g by mouth daily. Start taking on: January 28, 2021   pravastatin 80 MG tablet Commonly known as: PRAVACHOL Take 1 tablet (80 mg total) by mouth every evening.   sevelamer carbonate 800 MG tablet Commonly known as: RENVELA Take 1 tablet (800 mg total) by mouth 2 (two) times daily with a meal.   vitamin B-12 1000 MCG tablet Commonly known as: CYANOCOBALAMIN Take 1,000 mcg by mouth daily.   Vitamin C 500 MG Chew Chew 1,000 mg by mouth daily.   vitamin E 1000 UNIT capsule Take 1,000 Units by mouth daily.   Zinc 30 MG Caps Take 1 capsule by mouth daily.               Discharge Care Instructions  (From admission, onward)           Start     Ordered   01/27/21 0000  Discharge wound care:       Comments: Wound care to right lateral malleolus full thickness wound, chronic, nonhealing (POA):  Cleanse with NS, pat gently dry. Cover with silver hydrofiber (Aquacel Ag+ Advantage, Lawson # [id number]). Top with dry gauze 4x4s, ABD pad and secure with Kerlix roll gauze/paper tape.  Place foot into [employer]. Note:  May Re moisten with NS for ease in removal if adherent.   01/27/21 1122            Contact information for after-discharge care     [facility] .   Service: Skilled Nursing Contact information: [place] [address] [phone]                    Allergies  Allergen Reactions   Dust Mite Extract Itching and Other (See Comments)    Unknown reaction-potential shortness of breath   Prednisone Nausea And Vomiting   Rocephin [Ceftriaxone] Nausea And Vomiting    Consultations: Nephrology General surgery for tunneled catheter placement   Procedures/Studies: DG Chest Port 1 View  Result Date: 01/14/2021 CLINICAL DATA:  Vascular dialysis catheter in place. EXAM: PORTABLE CHEST 1 VIEW COMPARISON:  Radiograph  01/12/2021. FINDINGS: New right internal jugular central venous catheter tip overlies the lower SVC. No pneumothorax. Stable cardiomegaly. Slight worsening right basilar aeration and blunting of the costophrenic angle, suspicious for increasing pleural effusion and atelectasis. Vascular congestion. IMPRESSION: 1. New right internal jugular central venous catheter with tip overlying the lower SVC. No pneumothorax. 2. Slight worsening right basilar aeration and blunting of the costophrenic angle, suspicious for increasing pleural effusion and atelectasis. Unchanged cardiomegaly. Electronically Signed   By: Keith Rake M.D.   On: 01/14/2021 15:06   DG Chest Portable 1 View  Result Date: 01/12/2021 CLINICAL DATA:  Weakness. EXAM: PORTABLE CHEST 1 VIEW COMPARISON:  January 03, 2021. FINDINGS: Stable cardiomegaly. No pneumothorax or pleural effusion is noted. Stable bibasilar subsegmental atelectasis. Bony thorax is unremarkable. IMPRESSION: Stable bibasilar  subsegmental atelectasis. Electronically Signed   By: Marijo Conception M.D.   On: 01/12/2021 12:11   DG Chest Port 1 View  Result Date: 01/03/2021 CLINICAL DATA:  Shortness of breath EXAM: PORTABLE CHEST 1 VIEW COMPARISON:  November 29, 2020 FINDINGS: Stable cardiomegaly. Aortic atherosclerosis. Bibasilar opacities, favor atelectasis. No visible pleural effusion or pneumothorax. The visualized skeletal structures are unchanged. IMPRESSION: 1. Stable cardiomegaly. 2. Bibasilar opacities, favor atelectasis. 3.  Aortic Atherosclerosis (ICD10-I70.0). Electronically Signed   By: Dahlia Bailiff MD   On: 01/03/2021 18:29   DG C-Arm 1-60 Min-No Report  Result Date: 01/14/2021 Fluoroscopy was utilized by the requesting physician.  No radiographic interpretation.      Subjective: Having some difficulty with constipation. Trying to move his bowels. Denies any shortness of breath  Discharge Exam: Vitals:   01/27/21 1135 01/27/21 1140 01/27/21 1200 01/27/21 1230   BP: (!) 151/88 (!) 153/92 (!) 145/87 127/83  Pulse: (!) 110 (!) 110 (!) 112 (!) 115  Resp: 18     Temp: 98.3 F (36.8 C)     TempSrc: Oral     SpO2: 100%     Weight: 126 kg     Height:        General: Pt is alert, awake, not in acute distress Cardiovascular: RRR, S1/S2 +, no rubs, no gallops Respiratory: CTA bilaterally, no wheezing, no rhonchi Abdominal: Soft, NT, ND, bowel sounds + Extremities: 1+ edema, no cyanosis    The results of significant diagnostics from this hospitalization (including imaging, microbiology, ancillary and laboratory) are listed below for reference.     Microbiology: Recent Results (from the past 240 hour(s))  Urine Culture     Status: Abnormal   Collection Time: 01/20/21 11:14 AM   Specimen: Urine, Clean Catch  Result Value Ref Range Status   Specimen Description   Final    URINE, CLEAN CATCH Performed at [facility], [address] [place], [place] [address]    Special Requests   Final    NONE Performed at [facility], [address], [place], [name] [address]    Culture >=100,000 COLONIES/mL [facility] MORGANII (A)  Final   Report Status 01/23/2021 FINAL  Final   Organism ID, Bacteria MORGANELLA MORGANII (A)  Final      Susceptibility   Morganella morganii - MIC*    AMPICILLIN >=32 RESISTANT Resistant     CEFAZOLIN >=64 RESISTANT Resistant     CIPROFLOXACIN <=0.25 SENSITIVE Sensitive     GENTAMICIN <=1 SENSITIVE Sensitive     IMIPENEM 1 SENSITIVE Sensitive     NITROFURANTOIN RESISTANT Resistant     TRIMETH/SULFA <=20 SENSITIVE Sensitive     AMPICILLIN/SULBACTAM >=32 RESISTANT Resistant     PIP/TAZO <=4 SENSITIVE Sensitive     * >=100,000 COLONIES/mL MORGANELLA MORGANII     Labs: BNP (last 3 results) Recent Labs    11/29/20 1840 01/03/21 1645 01/12/21 1202  BNP 308.0* 480.0* 353.2*   Basic Metabolic Panel: Recent Labs  Lab 01/21/21 0630 01/22/21 1122 01/24/21 1952 01/25/21 0542  NA  --  136 132* 136  K  --  3.8 4.2  4.4  CL  --  100 99 101  CO2  --  31 27 28   GLUCOSE  --  129* 115* 115*  BUN  --  48* 44* 47*  CREATININE  --  3.43* 3.24* 3.33*  CALCIUM 8.6 8.1* 8.1* 8.7*  MG  --   --  2.1  --   PHOS  --  3.7 3.5 3.5   Liver Function Tests: Recent Labs  Lab 01/22/21 1122 01/24/21 1952 01/25/21 0542  AST  --  36  --   ALT  --  18  --   ALKPHOS  --  242*  --   BILITOT  --  1.0  --   PROT  --  6.5  --   ALBUMIN 2.4* 2.5* 2.5*   No results for input(s): LIPASE, AMYLASE in the last 168 hours. Recent Labs  Lab 01/20/21 1535  AMMONIA 25   CBC: Recent Labs  Lab 01/22/21 1122 01/24/21 1952 01/25/21 0542  WBC 7.4 8.1 7.9  HGB 8.7* 8.3* 8.6*  HCT 28.1* 26.9* 28.2*  MCV 89.8 89.4 89.5  PLT 266 231 243   Cardiac Enzymes: No results for input(s): CKTOTAL, CKMB, CKMBINDEX, TROPONINI in the last 168 hours. BNP: Invalid input(s): POCBNP CBG: Recent Labs  Lab 01/26/21 1116 01/26/21 1611 01/26/21 2007 01/27/21 0724 01/27/21 1107  GLUCAP 108* 135* 119* 110* 144*   D-Dimer No results for input(s): DDIMER in the last 72 hours. Hgb A1c No results for input(s): HGBA1C in the last 72 hours. Lipid Profile No results for input(s): CHOL, HDL, LDLCALC, TRIG, CHOLHDL, LDLDIRECT in the last 72 hours. Thyroid function studies No results for input(s): TSH, T4TOTAL, T3FREE, THYROIDAB in the last 72 hours.  Invalid input(s): FREET3 Anemia work up No results for input(s): VITAMINB12, FOLATE, FERRITIN, TIBC, IRON, RETICCTPCT in the last 72 hours. Urinalysis    Component Value Date/Time   COLORURINE AMBER (A) 01/20/2021 1114   APPEARANCEUR TURBID (A) 01/20/2021 1114   APPEARANCEUR Clear 09/10/2020 1122   LABSPEC 1.007 01/20/2021 1114   PHURINE 9.0 (H) 01/20/2021 1114   GLUCOSEU NEGATIVE 01/20/2021 1114   HGBUR SMALL (A) 01/20/2021 1114   BILIRUBINUR NEGATIVE 01/20/2021 1114   BILIRUBINUR Negative 09/10/2020 1122   KETONESUR NEGATIVE 01/20/2021 1114   PROTEINUR >=300 (A) 01/20/2021 1114    NITRITE NEGATIVE 01/20/2021 1114   LEUKOCYTESUR LARGE (A) 01/20/2021 1114   Sepsis Labs Invalid input(s): PROCALCITONIN,  WBC,  LACTICIDVEN Microbiology Recent Results (from the past 240 hour(s))  Urine Culture     Status: Abnormal   Collection Time: 01/20/21 11:14 AM   Specimen: Urine, Clean Catch  Result Value Ref Range Status   Specimen Description   Final    URINE, CLEAN CATCH Performed at [facility], [address] [place], [place] [address]    Special Requests   Final    NONE Performed at [facility], [address] [place],  [address]    Culture >=100,000 COLONIES/mL [facility] MORGANII (A)  Final   Report Status 01/23/2021 FINAL  Final   Organism ID, Bacteria MORGANELLA MORGANII (A)  Final      Susceptibility   Morganella morganii - MIC*    AMPICILLIN >=32 RESISTANT Resistant     CEFAZOLIN >=64 RESISTANT Resistant     CIPROFLOXACIN <=0.25 SENSITIVE Sensitive     GENTAMICIN <=1 SENSITIVE Sensitive     IMIPENEM 1 SENSITIVE Sensitive     NITROFURANTOIN RESISTANT Resistant     TRIMETH/SULFA <=20 SENSITIVE Sensitive     AMPICILLIN/SULBACTAM >=32 RESISTANT Resistant     PIP/TAZO <=4 SENSITIVE Sensitive     * >=100,000 COLONIES/mL MORGANELLA MORGANII     Time coordinating discharge: 71mins  SIGNED:   Kathie Dike, MD  [facility] 01/27/2021, 1:17 PM   If 7PM-7AM, please contact night-coverage [email or web address]

## 2021-01-27 NOTE — Progress Notes (Signed)
Nsg Discharge Note  Admit Date:  01/12/2021 Discharge date: 01/27/2021   Danny [name] to be D/C'd Skilled nursing facility per MD order.  Report given to Jacquenette Shone, [place].   Discharge Medication: Allergies as of 01/27/2021       Reactions   Dust Mite Extract Itching, Other (See Comments)   Unknown reaction-potential shortness of breath   Prednisone Nausea And Vomiting   Rocephin [ceftriaxone] Nausea And Vomiting        Medication List     STOP taking these medications    amLODipine 10 MG tablet Commonly known as: NORVASC   amoxicillin-clavulanate 875-125 MG tablet Commonly known as: Augmentin   aspirin 325 MG tablet   hydrALAZINE 100 MG tablet Commonly known as: APRESOLINE   torsemide 100 MG tablet Commonly known as: DEMADEX       TAKE these medications    acetaminophen 500 MG tablet Commonly known as: TYLENOL Take 1,000 mg by mouth every 6 (six) hours as needed for moderate pain or headache.   apixaban 5 MG Tabs tablet Commonly known as: ELIQUIS Take 1 tablet (5 mg total) by mouth 2 (two) times daily.   cholecalciferol 25 MCG (1000 UNIT) tablet Commonly known as: VITAMIN D3 Take 1,000 Units by mouth daily.   ciprofloxacin 250 MG tablet Commonly known as: CIPRO Take 1 tablet (250 mg total) by mouth daily for 4 days.   collagenase ointment Commonly known as: SANTYL Apply to right lateral malleolus once daily; apply in a 1/8 inch layer and top with saline moistened gauze 2X2.   feeding supplement (NEPRO CARB STEADY) Liqd Take 237 mLs by mouth 2 (two) times daily between meals.   ferrous sulfate 325 (65 FE) MG tablet Take 325 mg by mouth 2 (two) times daily with a meal.   FISH OIL PO Take 2,400 mg by mouth 2 (two) times daily.   Lactulose 20 GM/30ML Soln Take 30 mLs (20 g total) by mouth daily.   metoprolol succinate 25 MG 24 hr tablet Commonly known as: TOPROL-XL Take 1 tablet (25 mg total) by mouth in the morning and at bedtime. What changed:  when to take this   multivitamin with minerals Tabs tablet Take 1 tablet by mouth daily.   NovoLIN 70/30 ReliOn (70-30) 100 UNIT/ML injection Generic drug: insulin NPH-regular Human Inject 5 Units into the skin in the morning and at bedtime. What changed: how much to take   polyethylene glycol 17 g packet Commonly known as: MIRALAX / GLYCOLAX Take 17 g by mouth daily. Start taking on: January 28, 2021   pravastatin 80 MG tablet Commonly known as: PRAVACHOL Take 1 tablet (80 mg total) by mouth every evening.   sevelamer carbonate 800 MG tablet Commonly known as: RENVELA Take 1 tablet (800 mg total) by mouth 2 (two) times daily with a meal.   vitamin B-12 1000 MCG tablet Commonly known as: CYANOCOBALAMIN Take 1,000 mcg by mouth daily.   Vitamin C 500 MG Chew Chew 1,000 mg by mouth daily.   vitamin E 1000 UNIT capsule Take 1,000 Units by mouth daily.   Zinc 30 MG Caps Take 1 capsule by mouth daily.               Discharge Care Instructions  (From admission, onward)           Start     Ordered   01/27/21 0000  Discharge wound care:       Comments: Wound care to right lateral malleolus full  thickness wound, chronic, nonhealing (POA):  Cleanse with NS, pat gently dry. Cover with silver hydrofiber (Aquacel Ag+ Advantage, [employer] # [id number]). Top with dry gauze 4x4s, ABD pad and secure with Kerlix roll gauze/paper tape.  Place foot into [employer]. Note:  May Re moisten with NS for ease in removal if adherent.   01/27/21 1122            Discharge Assessment: Vitals:   01/27/21 1500 01/27/21 1515  BP: 117/73 119/77  Pulse: (!) 102 (!) 104  Resp:  16  Temp:  98.2 F (36.8 C)  SpO2:     Skin clean, dry and intact without evidence of skin break down, no evidence of skin tears noted. IV catheter discontinued intact. Site without signs and symptoms of complications - no redness or edema noted at insertion site, patient denies c/o pain - only slight tenderness  at site.  Dressing with slight pressure applied.  D/c Instructions-Education: Report given to Jacquenette Shone, [place]. All questions were answered and no further questions at this time. Patient in stable condition at time of discharge and in no acute distress/ patient transported via [facility].   Danny Justice Loletha Grayer, RN 01/27/2021 3:50 PM

## 2021-01-27 NOTE — Progress Notes (Signed)
Patient ID: Johnchristopher Sarvis, male   DOB: 1956/05/09, 65 y.o.   MRN: [id number] S: no acute events, reports some burning with urination. O:BP 106/62 (BP Location: Left Arm)   Pulse (!) 101   Temp 98.4 F (36.9 C)   Resp 20   Ht 6\' 1"  (1.854 m)   Wt 126 kg   SpO2 100%   BMI 36.65 kg/m   Intake/Output Summary (Last 24 hours) at 01/27/2021 0750 Last data filed at 01/27/2021 0500 Gross per 24 hour  Intake 720 ml  Output 1400 ml  Net -680 ml   Intake/Output: I/O last 3 completed shifts: In: 720 [P.O.:720] Out: 5427 [Urine:2050; CWCBJ:6283]  Intake/Output this shift:  No intake/output data recorded. Weight change: 1.9 kg Gen:NAD CVS: RRR Resp: CTA Abd: +BS, soft, NT/ND Ext: trace pretibial edema bilaterally  Recent Labs  Lab 01/21/21 0630 01/22/21 1122 01/24/21 1952 01/25/21 0542  NA  --  136 132* 136  K  --  3.8 4.2 4.4  CL  --  100 99 101  CO2  --  31 27 28   GLUCOSE  --  129* 115* 115*  BUN  --  48* 44* 47*  CREATININE  --  3.43* 3.24* 3.33*  ALBUMIN  --  2.4* 2.5* 2.5*  CALCIUM 8.6 8.1* 8.1* 8.7*  PHOS  --  3.7 3.5 3.5  AST  --   --  36  --   ALT  --   --  18  --    Liver Function Tests: Recent Labs  Lab 01/22/21 1122 01/24/21 1952 01/25/21 0542  AST  --  36  --   ALT  --  18  --   ALKPHOS  --  242*  --   BILITOT  --  1.0  --   PROT  --  6.5  --   ALBUMIN 2.4* 2.5* 2.5*   No results for input(s): LIPASE, AMYLASE in the last 168 hours. Recent Labs  Lab 01/20/21 1535  AMMONIA 25   CBC: Recent Labs  Lab 01/22/21 1122 01/24/21 1952 01/25/21 0542  WBC 7.4 8.1 7.9  HGB 8.7* 8.3* 8.6*  HCT 28.1* 26.9* 28.2*  MCV 89.8 89.4 89.5  PLT 266 231 243   Cardiac Enzymes: No results for input(s): CKTOTAL, CKMB, CKMBINDEX, TROPONINI in the last 168 hours. CBG: Recent Labs  Lab 01/26/21 0731 01/26/21 1116 01/26/21 1611 01/26/21 2007 01/27/21 0724  GLUCAP 96 108* 135* 119* 110*    Iron Studies: No results for input(s): IRON, TIBC, TRANSFERRIN, FERRITIN in  the last 72 hours. Studies/Results: No results found.  apixaban  5 mg Oral BID   Chlorhexidine Gluconate Cloth  6 each Topical Q0600   cholecalciferol  1,000 Units Oral Daily   ciprofloxacin  250 mg Oral Q24H   darbepoetin (ARANESP) injection - DIALYSIS  100 mcg Intravenous Q Tue-HD   feeding supplement (GLUCERNA SHAKE)  237 mL Oral BID BM   fluticasone  1 spray Each Nare Daily   insulin aspart  0-9 Units Subcutaneous TID WC   insulin aspart protamine- aspart  5 Units Subcutaneous BID WC   metoprolol succinate  25 mg Oral Q12H   nystatin   Topical TID   polyethylene glycol  17 g Oral Daily   pravastatin  80 mg Oral QPM   sevelamer carbonate  800 mg Oral BID WC   vitamin B-12  1,000 mcg Oral Daily   vitamin E  800 Units Oral Daily   zinc sulfate  220 mg  Oral Daily    BMET    Component Value Date/Time   NA 136 01/25/2021 0542   K 4.4 01/25/2021 0542   CL 101 01/25/2021 0542   CO2 28 01/25/2021 0542   GLUCOSE 115 (H) 01/25/2021 0542   BUN 47 (H) 01/25/2021 0542   CREATININE 3.33 (H) 01/25/2021 0542   CALCIUM 8.7 (L) 01/25/2021 0542   CALCIUM 8.6 01/21/2021 0630   GFRNONAA 19 (L) 01/25/2021 0542   GFRAA 17 (L) 03/30/2020 0620   CBC    Component Value Date/Time   WBC 7.9 01/25/2021 0542   RBC 3.15 (L) 01/25/2021 0542   HGB 8.6 (L) 01/25/2021 0542   HCT 28.2 (L) 01/25/2021 0542   PLT 243 01/25/2021 0542   MCV 89.5 01/25/2021 0542   MCH 27.3 01/25/2021 0542   MCHC 30.5 01/25/2021 0542   RDW 18.6 (H) 01/25/2021 0542   LYMPHSABS 0.7 01/12/2021 1202   MONOABS 0.6 01/12/2021 1202   EOSABS 0.3 01/12/2021 1202   BASOSABS 0.0 01/12/2021 1202     Assessment/Plan:  ESRD - s/p TDC placement 7/22 and HD initiated on 01/15/21, 7/25, 7/27, and 7/30.  HD on TTS schedule  UTI: on cipro, per primary Symptomatic anemia - on ESA HTN/volume - improved with UF. CKD BMD - on renvela. DM type 2 - per primary.  Disposition-  awaiting SNF placement and outpatient HD to be arranged at  [facility] on TTS schedule.   Gean Quint, MD [facility]

## 2021-01-27 NOTE — Care Management Important Message (Signed)
Important Message  Patient Details  Name: Danny [name] MRN: [id number] Date of Birth: 08/15/1955   Medicare Important Message Given:  Yes     [name] 01/27/2021, 11:30 AM

## 2021-01-29 DIAGNOSIS — Z992 Dependence on renal dialysis: Secondary | ICD-10-CM | POA: Diagnosis not present

## 2021-01-29 DIAGNOSIS — N186 End stage renal disease: Secondary | ICD-10-CM | POA: Diagnosis not present

## 2021-01-31 DIAGNOSIS — M25571 Pain in right ankle and joints of right foot: Secondary | ICD-10-CM | POA: Diagnosis not present

## 2021-01-31 DIAGNOSIS — E1169 Type 2 diabetes mellitus with other specified complication: Secondary | ICD-10-CM | POA: Diagnosis not present

## 2021-01-31 DIAGNOSIS — Z79899 Other long term (current) drug therapy: Secondary | ICD-10-CM | POA: Diagnosis not present

## 2021-01-31 DIAGNOSIS — R5381 Other malaise: Secondary | ICD-10-CM | POA: Diagnosis not present

## 2021-01-31 DIAGNOSIS — I509 Heart failure, unspecified: Secondary | ICD-10-CM | POA: Diagnosis not present

## 2021-01-31 DIAGNOSIS — M19071 Primary osteoarthritis, right ankle and foot: Secondary | ICD-10-CM | POA: Diagnosis not present

## 2021-02-01 ENCOUNTER — Inpatient Hospital Stay (HOSPITAL_COMMUNITY): Payer: Medicare HMO

## 2021-02-01 ENCOUNTER — Inpatient Hospital Stay (HOSPITAL_COMMUNITY): Payer: Medicare HMO | Attending: Physician Assistant | Admitting: Physician Assistant

## 2021-02-01 DIAGNOSIS — N186 End stage renal disease: Secondary | ICD-10-CM | POA: Diagnosis not present

## 2021-02-01 DIAGNOSIS — D631 Anemia in chronic kidney disease: Secondary | ICD-10-CM | POA: Insufficient documentation

## 2021-02-01 DIAGNOSIS — E538 Deficiency of other specified B group vitamins: Secondary | ICD-10-CM | POA: Insufficient documentation

## 2021-02-01 DIAGNOSIS — E1122 Type 2 diabetes mellitus with diabetic chronic kidney disease: Secondary | ICD-10-CM | POA: Insufficient documentation

## 2021-02-01 DIAGNOSIS — Z992 Dependence on renal dialysis: Secondary | ICD-10-CM | POA: Insufficient documentation

## 2021-02-01 DIAGNOSIS — D472 Monoclonal gammopathy: Secondary | ICD-10-CM | POA: Insufficient documentation

## 2021-02-02 DIAGNOSIS — N184 Chronic kidney disease, stage 4 (severe): Secondary | ICD-10-CM | POA: Diagnosis not present

## 2021-02-02 DIAGNOSIS — L89513 Pressure ulcer of right ankle, stage 3: Secondary | ICD-10-CM | POA: Diagnosis not present

## 2021-02-02 DIAGNOSIS — E669 Obesity, unspecified: Secondary | ICD-10-CM | POA: Diagnosis not present

## 2021-02-02 DIAGNOSIS — N186 End stage renal disease: Secondary | ICD-10-CM | POA: Diagnosis not present

## 2021-02-02 DIAGNOSIS — M6281 Muscle weakness (generalized): Secondary | ICD-10-CM | POA: Diagnosis not present

## 2021-02-03 DIAGNOSIS — Z79899 Other long term (current) drug therapy: Secondary | ICD-10-CM | POA: Diagnosis not present

## 2021-02-03 DIAGNOSIS — I10 Essential (primary) hypertension: Secondary | ICD-10-CM | POA: Diagnosis not present

## 2021-02-03 DIAGNOSIS — N186 End stage renal disease: Secondary | ICD-10-CM | POA: Diagnosis not present

## 2021-02-03 DIAGNOSIS — M25571 Pain in right ankle and joints of right foot: Secondary | ICD-10-CM | POA: Diagnosis not present

## 2021-02-03 DIAGNOSIS — R52 Pain, unspecified: Secondary | ICD-10-CM | POA: Diagnosis not present

## 2021-02-03 DIAGNOSIS — Z992 Dependence on renal dialysis: Secondary | ICD-10-CM | POA: Diagnosis not present

## 2021-02-03 DIAGNOSIS — M19071 Primary osteoarthritis, right ankle and foot: Secondary | ICD-10-CM | POA: Diagnosis not present

## 2021-02-03 DIAGNOSIS — R5381 Other malaise: Secondary | ICD-10-CM | POA: Diagnosis not present

## 2021-02-03 DIAGNOSIS — R600 Localized edema: Secondary | ICD-10-CM | POA: Diagnosis not present

## 2021-02-03 DIAGNOSIS — E1169 Type 2 diabetes mellitus with other specified complication: Secondary | ICD-10-CM | POA: Diagnosis not present

## 2021-02-04 ENCOUNTER — Emergency Department (HOSPITAL_COMMUNITY)
Admission: EM | Admit: 2021-02-04 | Discharge: 2021-02-04 | Disposition: A | Discharge status: Home / Self Care | Payer: Medicare HMO | Attending: Emergency Medicine | Admitting: Emergency Medicine

## 2021-02-04 ENCOUNTER — Encounter (HOSPITAL_COMMUNITY): Payer: Self-pay | Admitting: Hematology

## 2021-02-04 ENCOUNTER — Encounter (HOSPITAL_COMMUNITY): Payer: Self-pay

## 2021-02-04 ENCOUNTER — Other Ambulatory Visit: Payer: Self-pay

## 2021-02-04 DIAGNOSIS — N186 End stage renal disease: Secondary | ICD-10-CM | POA: Diagnosis not present

## 2021-02-04 DIAGNOSIS — L089 Local infection of the skin and subcutaneous tissue, unspecified: Secondary | ICD-10-CM

## 2021-02-04 DIAGNOSIS — E1122 Type 2 diabetes mellitus with diabetic chronic kidney disease: Secondary | ICD-10-CM | POA: Insufficient documentation

## 2021-02-04 DIAGNOSIS — I5032 Chronic diastolic (congestive) heart failure: Secondary | ICD-10-CM | POA: Diagnosis not present

## 2021-02-04 DIAGNOSIS — Z79899 Other long term (current) drug therapy: Secondary | ICD-10-CM | POA: Insufficient documentation

## 2021-02-04 DIAGNOSIS — E1121 Type 2 diabetes mellitus with diabetic nephropathy: Secondary | ICD-10-CM | POA: Diagnosis not present

## 2021-02-04 DIAGNOSIS — I10 Essential (primary) hypertension: Secondary | ICD-10-CM | POA: Diagnosis not present

## 2021-02-04 DIAGNOSIS — Z743 Need for continuous supervision: Secondary | ICD-10-CM | POA: Diagnosis not present

## 2021-02-04 DIAGNOSIS — I132 Hypertensive heart and chronic kidney disease with heart failure and with stage 5 chronic kidney disease, or end stage renal disease: Secondary | ICD-10-CM | POA: Insufficient documentation

## 2021-02-04 DIAGNOSIS — Z7401 Bed confinement status: Secondary | ICD-10-CM | POA: Diagnosis not present

## 2021-02-04 DIAGNOSIS — R52 Pain, unspecified: Secondary | ICD-10-CM | POA: Diagnosis not present

## 2021-02-04 DIAGNOSIS — N492 Inflammatory disorders of scrotum: Secondary | ICD-10-CM | POA: Diagnosis not present

## 2021-02-04 DIAGNOSIS — Z87891 Personal history of nicotine dependence: Secondary | ICD-10-CM | POA: Insufficient documentation

## 2021-02-04 DIAGNOSIS — Z7901 Long term (current) use of anticoagulants: Secondary | ICD-10-CM | POA: Diagnosis not present

## 2021-02-04 DIAGNOSIS — Z794 Long term (current) use of insulin: Secondary | ICD-10-CM | POA: Diagnosis not present

## 2021-02-04 NOTE — ED Provider Notes (Signed)
[facility] EMERGENCY DEPARTMENT Provider Note   CSN: [id number] Arrival date & time: 02/04/21  1884     History Chief Complaint  Patient presents with   Wound Infection    Danny [name] is a 65 y.o. male.  Patient had a scrotal abscess that was drained yesterday and packed.  He was sent to the emergency department for evaluation of this abscess  The history is provided by the patient and medical records. No language interpreter was used.  Abscess Abscess location: Scrotum. Abscess quality: not draining   Red streaking: no   Duration:  2 days Progression:  Resolved Chronicity:  New Context: diabetes   Relieved by: I&D. Worsened by:  Nothing Associated symptoms: no fatigue and no headaches       Past Medical History:  Diagnosis Date   Anemia    Arthritis    CKD (chronic kidney disease), stage V ([place])    Diabetes mellitus without complication ([place])    Diastolic congestive heart failure ([place])    Foot ulcer ([place])    Gout    Hypertension    Urinary retention     Patient Active Problem List   Diagnosis Date Noted   Goals of care, counseling/discussion 16/60/6301   Acute metabolic encephalopathy 60/03/9322   End stage renal disease (HCC)    Symptomatic anemia 01/12/2021   B12 deficiency 12/06/2020   Osteomyelitis of right ankle (HCC)    Cellulitis, leg 10/28/2020   CKD (chronic kidney disease) stage 4, GFR 15-29 ml/min (HCC) 10/28/2020   Moderate protein-calorie malnutrition ([place]) 10/26/2020   Medical non-compliance 10/26/2020   Chronic ulcer of right ankle (HCC)    Venous stasis    Cellulitis 10/25/2020   Normocytic anemia 09/13/2020   Class 2 obesity    Respiratory failure ([place]) 03/23/2020   UTI (urinary tract infection), bacterial 01/22/2020   Acute on chronic anemia 01/16/2020   Benign prostatic hyperplasia with urinary obstruction 01/15/2020   AKI (acute kidney injury) ([place]) 07/08/2019   Hypoxia 07/08/2019   Pressure injury of skin 07/08/2019   Edema  of both lower extremities    Anasarca 03/23/2019   Bladder outlet obstruction 03/23/2019   Yeast infection of the skin 03/16/2019   Diabetic ulcer of left foot ([place]) 03/15/2019   Diabetic ulcer of ankle ([place]) 03/15/2019   Hypertension associated with stage 3 chronic kidney disease due to type 2 diabetes mellitus ([place]) 03/09/2019   Controlled type 2 diabetes mellitus with stage 3 chronic kidney disease, with long-term current use of insulin ([place]) 03/09/2019   Type 2 diabetes with nephropathy ([place]) 03/09/2019   Chronic gout due to renal impairment without tophus 03/09/2019   Emphysematous cystitis 03/05/2019   Bilateral hydronephrosis    Bilateral cellulitis of lower leg 03/03/2019   Urinary retention 03/03/2019   Klebsiella Cystitis 03/03/2019   UTI due to Klebsiella species 03/03/2019   Plantar ulcer of left foot ([place]) 03/03/2019   Chronic diastolic CHF (congestive heart failure) ([place]) 02/25/2019   Acute respiratory failure with hypoxia ([place]) 02/25/2019   Acute renal failure superimposed on stage 4 chronic kidney disease ([place]) 02/24/2019   Congestive heart failure ([place]) 02/24/2019   Dyspnea 02/23/2019   Essential hypertension 02/23/2019   Diabetes mellitus ([place]) 02/23/2019   CKD (chronic kidney disease) 02/23/2019   Psoriasis 02/23/2019    Past Surgical History:  Procedure Laterality Date   ANKLE SURGERY Right    CHOLECYSTECTOMY     FOOT SURGERY Right    INSERTION OF DIALYSIS CATHETER Right 01/14/2021  Procedure: INSERTION OF TUNNELED DIALYSIS CATHETER RIGHT INTERNAL JUGULAR;  Surgeon: Virl Cagey, MD;  Location: [facility];  Service: General;  Laterality: Right;   TRANSURETHRAL RESECTION OF PROSTATE N/A 01/15/2020   Procedure: TRANSURETHRAL RESECTION OF THE PROSTATE (TURP)  with General anesthesia and spinal;  Surgeon: Cleon Gustin, MD;  Location: [facility];  Service: Urology;  Laterality: N/A;       Family History  Problem Relation Age of Onset   Diabetes Mother     Heart attack Mother    Heart attack Father    Diabetes Brother     Social History   Tobacco Use   Smoking status: Former   Smokeless tobacco: Never  Scientific laboratory technician Use: Never used  Substance Use Topics   Alcohol use: Not Currently   Drug use: Never    Home Medications Prior to Admission medications   Medication Sig Start Date End Date Taking? Authorizing Provider  acetaminophen (TYLENOL) 500 MG tablet Take 1,000 mg by mouth every 6 (six) hours as needed for moderate pain or headache.    [provider]  apixaban (ELIQUIS) 5 MG TABS tablet Take 1 tablet (5 mg total) by mouth 2 (two) times daily. 01/27/21   Kathie Dike, MD  Ascorbic Acid (VITAMIN C) 500 MG CHEW Chew 1,000 mg by mouth daily.    [provider]  cholecalciferol (VITAMIN D3) 25 MCG (1000 UNIT) tablet Take 1,000 Units by mouth daily.    [provider]  collagenase (SANTYL) ointment Apply to right lateral malleolus once daily; apply in a 1/8 inch layer and top with saline moistened gauze 2X2. 11/05/20   Barton Dubois, MD  ferrous sulfate 325 (65 FE) MG tablet Take 325 mg by mouth 2 (two) times daily with a meal.    [provider]  Lactulose 20 GM/30ML SOLN Take 30 mLs (20 g total) by mouth daily. 11/11/20   [name], MD  metoprolol succinate (TOPROL-XL) 25 MG 24 hr tablet Take 1 tablet (25 mg total) by mouth in the morning and at bedtime. 01/27/21   Kathie Dike, MD  Multiple Vitamin (MULTIVITAMIN WITH MINERALS) TABS tablet Take 1 tablet by mouth daily.    [provider]  NOVOLIN 70/30 RELION (70-30) 100 UNIT/ML injection Inject 5 Units into the skin in the morning and at bedtime. 01/27/21   Kathie Dike, MD  Nutritional Supplements (FEEDING SUPPLEMENT, NEPRO CARB STEADY,) LIQD Take 237 mLs by mouth 2 (two) times daily between meals. 11/05/20   Barton Dubois, MD  Omega-3 Fatty Acids (FISH OIL PO) Take 2,400 mg by mouth 2 (two) times daily.    [provider]  polyethylene glycol (MIRALAX / GLYCOLAX) 17 g packet Take 17 g by mouth daily. 01/28/21   Kathie Dike, MD  pravastatin (PRAVACHOL) 80 MG tablet Take 1 tablet (80 mg total) by mouth every evening. 03/20/19   Gerlene Fee, NP  sevelamer carbonate (RENVELA) 800 MG tablet Take 1 tablet (800 mg total) by mouth 2 (two) times daily with a meal. 01/27/21   Kathie Dike, MD  vitamin B-12 (CYANOCOBALAMIN) 1000 MCG tablet Take 1,000 mcg by mouth daily.    [provider]  vitamin E 1000 UNIT capsule Take 1,000 Units by mouth daily.    [provider]  Zinc 30 MG CAPS Take 1 capsule by mouth daily.    [provider]    Allergies    Dust mite extract, Prednisone, and Rocephin [ceftriaxone]  Review  of Systems   Review of Systems  Constitutional:  Negative for appetite change and fatigue.  HENT:  Negative for congestion, ear discharge and sinus pressure.   Eyes:  Negative for discharge.  Respiratory:  Negative for cough.   Cardiovascular:  Negative for chest pain.  Gastrointestinal:  Negative for abdominal pain and diarrhea.  Genitourinary:  Negative for frequency and hematuria.  Musculoskeletal:  Negative for back pain.  Skin:  Negative for rash.       Scrotal abscess  Neurological:  Negative for seizures and headaches.  Psychiatric/Behavioral:  Negative for hallucinations.    Physical Exam Updated Vital Signs BP (!) 163/92 (BP Location: Left Arm)   Pulse (!) 52   Temp 97.9 F (36.6 C)   Resp 16   Ht 6\' 1"  (1.854 m)   Wt 116.2 kg   SpO2 100%   BMI 33.79 kg/m   Physical Exam Vitals and nursing note reviewed.  Constitutional:      Appearance: He is well-developed.  HENT:     Head: Normocephalic.     Mouth/Throat:     Mouth: Mucous membranes are moist.  Eyes:     General: No scleral icterus.    Conjunctiva/sclera: Conjunctivae normal.  Neck:     Thyroid: No thyromegaly.  Cardiovascular:     Rate and Rhythm: Normal rate and regular  rhythm.     Heart sounds: No murmur heard.   No friction rub. No gallop.  Pulmonary:     Breath sounds: No stridor. No wheezing or rales.  Chest:     Chest wall: No tenderness.  Abdominal:     General: There is no distension.     Tenderness: There is no abdominal tenderness. There is no rebound.  Genitourinary:    Comments: Patient had a scrotal abscess to the right side of his scrotum that has been drained and had packing.  Packing was removed and the abscess looks healed.  The nurse put more packing in his scrotum Musculoskeletal:        General: Normal range of motion.     Cervical back: Neck supple.  Lymphadenopathy:     Cervical: No cervical adenopathy.  Skin:    Findings: No erythema or rash.  Neurological:     Mental Status: He is alert and oriented to person, place, and time.     Motor: No abnormal muscle tone.     Coordination: Coordination normal.  Psychiatric:        Behavior: Behavior normal.    ED Results / Procedures / Treatments   Labs (all labs ordered are listed, but only abnormal results are displayed) Labs Reviewed - No data to display  EKG None  Radiology No results found.  Procedures Procedures   Medications Ordered in ED Medications - No data to display  ED Course  I have reviewed the triage vital signs and the nursing notes.  Pertinent labs & imaging results that were available during my care of the patient were reviewed by me and considered in my medical decision making (see chart for details).  Patient with a healing abscess.  The packing was removed.  The nurse irrigated the abscess area.  No pus seen.  It was packed again with gauze and can be removed tomorrow with no need to repack MDM Rules/Calculators/A&P                           Healing abscess to right scrotum Final  Clinical Impression(s) / ED Diagnoses Final diagnoses:  Wound infection    Rx / DC Orders ED Discharge Orders     None        Milton Ferguson,  MD 02/04/21 [phone]

## 2021-02-04 NOTE — ED Notes (Signed)
Lanced area cleaned,  repacked and dressed.

## 2021-02-04 NOTE — ED Notes (Signed)
Dr did assessment during triage.

## 2021-02-04 NOTE — ED Triage Notes (Signed)
Pt arrived [facility] from [facility] c/o a wound on his scrotum.

## 2021-02-04 NOTE — Discharge Instructions (Addendum)
Packing can be taken out tomorrow at home.  Patient's infection has healed.  He can follow-up as needed

## 2021-02-05 DIAGNOSIS — Z992 Dependence on renal dialysis: Secondary | ICD-10-CM | POA: Diagnosis not present

## 2021-02-05 DIAGNOSIS — N186 End stage renal disease: Secondary | ICD-10-CM | POA: Diagnosis not present

## 2021-02-07 ENCOUNTER — Inpatient Hospital Stay (HOSPITAL_COMMUNITY): Payer: Medicare HMO

## 2021-02-07 ENCOUNTER — Other Ambulatory Visit: Payer: Self-pay

## 2021-02-07 DIAGNOSIS — D472 Monoclonal gammopathy: Secondary | ICD-10-CM | POA: Diagnosis not present

## 2021-02-07 DIAGNOSIS — N184 Chronic kidney disease, stage 4 (severe): Secondary | ICD-10-CM

## 2021-02-07 DIAGNOSIS — E785 Hyperlipidemia, unspecified: Secondary | ICD-10-CM | POA: Diagnosis not present

## 2021-02-07 DIAGNOSIS — E538 Deficiency of other specified B group vitamins: Secondary | ICD-10-CM | POA: Diagnosis not present

## 2021-02-07 DIAGNOSIS — D649 Anemia, unspecified: Secondary | ICD-10-CM | POA: Diagnosis not present

## 2021-02-07 DIAGNOSIS — E1122 Type 2 diabetes mellitus with diabetic chronic kidney disease: Secondary | ICD-10-CM | POA: Diagnosis not present

## 2021-02-07 DIAGNOSIS — D631 Anemia in chronic kidney disease: Secondary | ICD-10-CM

## 2021-02-07 DIAGNOSIS — I10 Essential (primary) hypertension: Secondary | ICD-10-CM | POA: Diagnosis not present

## 2021-02-07 DIAGNOSIS — Z992 Dependence on renal dialysis: Secondary | ICD-10-CM | POA: Diagnosis not present

## 2021-02-07 DIAGNOSIS — N186 End stage renal disease: Secondary | ICD-10-CM | POA: Diagnosis not present

## 2021-02-07 DIAGNOSIS — R52 Pain, unspecified: Secondary | ICD-10-CM | POA: Diagnosis not present

## 2021-02-07 DIAGNOSIS — I5030 Unspecified diastolic (congestive) heart failure: Secondary | ICD-10-CM | POA: Diagnosis not present

## 2021-02-07 DIAGNOSIS — R5381 Other malaise: Secondary | ICD-10-CM | POA: Diagnosis not present

## 2021-02-07 DIAGNOSIS — E1169 Type 2 diabetes mellitus with other specified complication: Secondary | ICD-10-CM | POA: Diagnosis not present

## 2021-02-07 LAB — IRON AND TIBC
Iron: 45 ug/dL (ref 45–182)
Saturation Ratios: 19 % (ref 17.9–39.5)
TIBC: 242 ug/dL — ABNORMAL LOW (ref 250–450)
UIBC: 197 ug/dL

## 2021-02-07 LAB — CBC WITH DIFFERENTIAL/PLATELET
Abs Immature Granulocytes: 0.02 10*3/uL (ref 0.00–0.07)
Basophils Absolute: 0.1 10*3/uL (ref 0.0–0.1)
Basophils Relative: 1 %
Eosinophils Absolute: 0.5 10*3/uL (ref 0.0–0.5)
Eosinophils Relative: 6 %
HCT: 31 % — ABNORMAL LOW (ref 39.0–52.0)
Hemoglobin: 10 g/dL — ABNORMAL LOW (ref 13.0–17.0)
Immature Granulocytes: 0 %
Lymphocytes Relative: 12 %
Lymphs Abs: 1 10*3/uL (ref 0.7–4.0)
MCH: 28.2 pg (ref 26.0–34.0)
MCHC: 32.3 g/dL (ref 30.0–36.0)
MCV: 87.6 fL (ref 80.0–100.0)
Monocytes Absolute: 0.7 10*3/uL (ref 0.1–1.0)
Monocytes Relative: 8 %
Neutro Abs: 5.9 10*3/uL (ref 1.7–7.7)
Neutrophils Relative %: 73 %
Platelets: 302 10*3/uL (ref 150–400)
RBC: 3.54 MIL/uL — ABNORMAL LOW (ref 4.22–5.81)
RDW: 18.4 % — ABNORMAL HIGH (ref 11.5–15.5)
WBC: 8.1 10*3/uL (ref 4.0–10.5)
nRBC: 0 % (ref 0.0–0.2)

## 2021-02-07 LAB — FOLATE: Folate: 17.8 ng/mL (ref >5.9)

## 2021-02-07 LAB — VITAMIN B12: Vitamin B-12: 2395 pg/mL — ABNORMAL HIGH (ref 180–914)

## 2021-02-07 LAB — VITAMIN D 25 HYDROXY (VIT D DEFICIENCY, FRACTURES): Vit D, 25-Hydroxy: 51.87 ng/mL (ref 30–100)

## 2021-02-07 LAB — FERRITIN: Ferritin: 377 ng/mL — ABNORMAL HIGH (ref 24–336)

## 2021-02-08 DIAGNOSIS — Z992 Dependence on renal dialysis: Secondary | ICD-10-CM | POA: Diagnosis not present

## 2021-02-08 DIAGNOSIS — E669 Obesity, unspecified: Secondary | ICD-10-CM | POA: Diagnosis not present

## 2021-02-08 DIAGNOSIS — N184 Chronic kidney disease, stage 4 (severe): Secondary | ICD-10-CM | POA: Diagnosis not present

## 2021-02-08 DIAGNOSIS — M6281 Muscle weakness (generalized): Secondary | ICD-10-CM | POA: Diagnosis not present

## 2021-02-08 DIAGNOSIS — L89513 Pressure ulcer of right ankle, stage 3: Secondary | ICD-10-CM | POA: Diagnosis not present

## 2021-02-08 DIAGNOSIS — R5381 Other malaise: Secondary | ICD-10-CM | POA: Diagnosis not present

## 2021-02-08 DIAGNOSIS — D649 Anemia, unspecified: Secondary | ICD-10-CM | POA: Diagnosis not present

## 2021-02-08 DIAGNOSIS — N186 End stage renal disease: Secondary | ICD-10-CM | POA: Diagnosis not present

## 2021-02-08 DIAGNOSIS — L089 Local infection of the skin and subcutaneous tissue, unspecified: Secondary | ICD-10-CM | POA: Diagnosis not present

## 2021-02-08 NOTE — Progress Notes (Signed)
[place], [place] [address]   CLINIC:  Medical Oncology/Hematology  PCP:  Celene Squibb, MD [address] [place] [address] [phone]   REASON FOR VISIT:  Follow-up for MGUS and anemia of CKD  PRIOR THERAPY: Retacrit  CURRENT THERAPY: Epogen injections at dialysis  INTERVAL HISTORY:  Danny [name] 65 y.o. male returns for routine follow-up of his anemia of ESRD and MGUS.  He was last seen by Tarri Abernethy PA-C on 12/06/2020.  Since his last visit, Mr. Dirr was hospitalized from 01/12/2021 through 01/27/2021 due to worsening anemia and kidney function.  During that time, he was started on hemodialysis, and is now receiving Epogen injections through the dialysis center.  He is currently residing at [facility] for rehabilitation.  At today's visit, he reports feeling fair.  He reports that dialysis is going well, and that he is slowly regaining strength through rehabilitation.  Overall, he feels better after starting dialysis.  He denies any gross symptoms of blood loss such as epistaxis, hematemesis, hematochezia, or melena.  He remains fatigued (energy 40%), but this is improved after he started dialysis.  He denies any chest pain, dyspnea on exertion, palpitations, or syncope.  No bone pain or hyperviscosity symptoms.  He does have chronic diabetic neuropathy of his feet, but denies any recent changes in his symptoms.  No B symptoms such as fever, chills, night sweats.  He does not note any new lumps or bumps.  He has 40% energy and 100% appetite. He has lost 20 to 30 pounds after his hospital stay and starting dialysis.   REVIEW OF SYSTEMS:  Review of Systems  Constitutional:  Positive for fatigue (Energy 40%). Negative for appetite change, chills, diaphoresis, fever and unexpected weight change.  HENT:   Negative for lump/mass and nosebleeds.   Eyes:  Negative for eye problems.  Respiratory:  Positive for cough (Occasional). Negative for  hemoptysis and shortness of breath.   Cardiovascular:  Negative for chest pain, leg swelling and palpitations.  Gastrointestinal:  Positive for constipation. Negative for abdominal pain, blood in stool, diarrhea, nausea and vomiting.       Heartburn  Genitourinary:  Negative for hematuria.   Skin: Negative.   Neurological:  Positive for dizziness (Positional vertigo) and numbness (Diabetic peripheral neuropathy of feet). Negative for headaches and light-headedness.  Hematological:  Does not bruise/bleed easily.  Psychiatric/Behavioral:  Positive for depression. Negative for suicidal ideas.      PAST MEDICAL/SURGICAL HISTORY:  Past Medical History:  Diagnosis Date   Anemia    Arthritis    CKD (chronic kidney disease), stage V (HCC)    Diabetes mellitus without complication (HCC)    Diastolic congestive heart failure (HCC)    Foot ulcer ([place])    Gout    Hypertension    Urinary retention    Past Surgical History:  Procedure Laterality Date   ANKLE SURGERY Right    CHOLECYSTECTOMY     FOOT SURGERY Right    INSERTION OF DIALYSIS CATHETER Right 01/14/2021   Procedure: INSERTION OF TUNNELED DIALYSIS CATHETER RIGHT INTERNAL JUGULAR;  Surgeon: Virl Cagey, MD;  Location: [facility];  Service: General;  Laterality: Right;   TRANSURETHRAL RESECTION OF PROSTATE N/A 01/15/2020   Procedure: TRANSURETHRAL RESECTION OF THE PROSTATE (TURP)  with General anesthesia and spinal;  Surgeon: Cleon Gustin, MD;  Location: [facility];  Service: Urology;  Laterality: N/A;     SOCIAL HISTORY:  Social History  Socioeconomic History   Marital status: Divorced    Spouse name: Not on file   Number of children: Not on file   Years of education: Not on file   Highest education level: Not on file  Occupational History   Not on file  Tobacco Use   Smoking status: Former   Smokeless tobacco: Never  Vaping Use   Vaping Use: Never used  Substance and Sexual Activity   Alcohol use: Not Currently    Drug use: Never   Sexual activity: Not Currently  Other Topics Concern   Not on file  Social History Narrative   Not on file   Social Determinants of Health   Financial Resource Strain: Low Risk    Difficulty of Paying Living Expenses: Not very hard  Food Insecurity: No Food Insecurity   Worried About Running Out of Food in the Last Year: Never true   [place] in the Last Year: Never true  Transportation Needs: No Transportation Needs   Lack of Transportation (Medical): No   Lack of Transportation (Non-Medical): No  Physical Activity: Inactive   Days of Exercise per Week: 0 days   Minutes of Exercise per Session: 0 min  Stress: No Stress Concern Present   Feeling of Stress : Only a little  Social Connections: Not on file  Intimate Partner Violence: Not At Risk   Fear of Current or Ex-Partner: No   Emotionally Abused: No   Physically Abused: No   Sexually Abused: No    FAMILY HISTORY:  Family History  Problem Relation Age of Onset   Diabetes Mother    Heart attack Mother    Heart attack Father    Diabetes Brother     CURRENT MEDICATIONS:  Outpatient Encounter Medications as of 02/09/2021  Medication Sig Note   acetaminophen (TYLENOL) 500 MG tablet Take 1,000 mg by mouth every 6 (six) hours as needed for moderate pain or headache.    apixaban (ELIQUIS) 5 MG TABS tablet Take 1 tablet (5 mg total) by mouth 2 (two) times daily.    Ascorbic Acid (VITAMIN C) 500 MG CHEW Chew 1,000 mg by mouth daily.    cholecalciferol (VITAMIN D3) 25 MCG (1000 UNIT) tablet Take 1,000 Units by mouth daily.    collagenase (SANTYL) ointment Apply to right lateral malleolus once daily; apply in a 1/8 inch layer and top with saline moistened gauze 2X2.    ferrous sulfate 325 (65 FE) MG tablet Take 325 mg by mouth 2 (two) times daily with a meal.    Lactulose 20 GM/30ML SOLN Take 30 mLs (20 g total) by mouth daily.    metoprolol succinate (TOPROL-XL) 25 MG 24 hr tablet Take 1 tablet (25 mg  total) by mouth in the morning and at bedtime.    Multiple Vitamin (MULTIVITAMIN WITH MINERALS) TABS tablet Take 1 tablet by mouth daily.    NOVOLIN 70/30 RELION (70-30) 100 UNIT/ML injection Inject 5 Units into the skin in the morning and at bedtime.    Nutritional Supplements (FEEDING SUPPLEMENT, NEPRO CARB STEADY,) LIQD Take 237 mLs by mouth 2 (two) times daily between meals.    Omega-3 Fatty Acids (FISH OIL PO) Take 2,400 mg by mouth 2 (two) times daily.    polyethylene glycol (MIRALAX / GLYCOLAX) 17 g packet Take 17 g by mouth daily.    pravastatin (PRAVACHOL) 80 MG tablet Take 1 tablet (80 mg total) by mouth every evening. 01/13/2021: Last filled 01/09/2021 1QD (90ds) per pharmacy records  sevelamer carbonate (RENVELA) 800 MG tablet Take 1 tablet (800 mg total) by mouth 2 (two) times daily with a meal.    vitamin B-12 (CYANOCOBALAMIN) 1000 MCG tablet Take 1,000 mcg by mouth daily.    vitamin E 1000 UNIT capsule Take 1,000 Units by mouth daily.    Zinc 30 MG CAPS Take 1 capsule by mouth daily.    No facility-administered encounter medications on file as of 02/09/2021.    ALLERGIES:  Allergies  Allergen Reactions   Dust Mite Extract Itching and Other (See Comments)    Unknown reaction-potential shortness of breath   Prednisone Nausea And Vomiting   Rocephin [Ceftriaxone] Nausea And Vomiting     PHYSICAL EXAM:  ECOG PERFORMANCE STATUS: 3 - Symptomatic, >50% confined to bed  There were no vitals filed for this visit. There were no vitals filed for this visit. Physical Exam Constitutional:      Appearance: Normal appearance. He is obese.  HENT:     Head: Normocephalic and atraumatic.     Mouth/Throat:     Mouth: Mucous membranes are moist.  Eyes:     Extraocular Movements: Extraocular movements intact.     Pupils: Pupils are equal, round, and reactive to light.  Cardiovascular:     Rate and Rhythm: Normal rate and regular rhythm.     Pulses: Normal pulses.     Heart sounds:  Normal heart sounds.  Pulmonary:     Effort: Pulmonary effort is normal.     Breath sounds: Normal breath sounds.  Abdominal:     General: Bowel sounds are normal.     Palpations: Abdomen is soft.     Tenderness: There is no abdominal tenderness.  Musculoskeletal:        General: No swelling.     Right lower leg: Edema (Trace) present.     Left lower leg: Edema (Trace) present.  Lymphadenopathy:     Cervical: No cervical adenopathy.  Skin:    General: Skin is warm and dry.  Neurological:     General: No focal deficit present.     Mental Status: He is alert and oriented to person, place, and time.  Psychiatric:        Mood and Affect: Mood normal.        Behavior: Behavior normal.     LABORATORY DATA:  I have reviewed the labs as listed.  CBC    Component Value Date/Time   WBC 8.1 02/07/2021 1025   RBC 3.54 (L) 02/07/2021 1025   HGB 10.0 (L) 02/07/2021 1025   HCT 31.0 (L) 02/07/2021 1025   PLT 302 02/07/2021 1025   MCV 87.6 02/07/2021 1025   MCH 28.2 02/07/2021 1025   MCHC 32.3 02/07/2021 1025   RDW 18.4 (H) 02/07/2021 1025   LYMPHSABS 1.0 02/07/2021 1025   MONOABS 0.7 02/07/2021 1025   EOSABS 0.5 02/07/2021 1025   BASOSABS 0.1 02/07/2021 1025   CMP Latest Ref Rng & Units 01/25/2021 01/24/2021 01/22/2021  Glucose 70 - 99 mg/dL 115(H) 115(H) 129(H)  BUN 8 - 23 mg/dL 47(H) 44(H) 48(H)  Creatinine 0.61 - 1.24 mg/dL 3.33(H) 3.24(H) 3.43(H)  Sodium 135 - 145 mmol/L 136 132(L) 136  Potassium 3.5 - 5.1 mmol/L 4.4 4.2 3.8  Chloride 98 - 111 mmol/L 101 99 100  CO2 22 - 32 mmol/L 28 27 31   Calcium 8.9 - 10.3 mg/dL 8.7(L) 8.1(L) 8.1(L)  Total Protein 6.5 - 8.1 g/dL - 6.5 -  Total Bilirubin 0.3 - 1.2 mg/dL -  1.0 -  Alkaline Phos 38 - 126 U/L - 242(H) -  AST 15 - 41 U/L - 36 -  ALT 0 - 44 U/L - 18 -    DIAGNOSTIC IMAGING:  I have independently reviewed the relevant imaging and discussed with the patient.  ASSESSMENT & PLAN: 1.  Normocytic anemia - Multifactorial etiology  related to ESRD and relative iron deficiency - No prior history of transfusion.  Has never had an EGD or colonoscopy. - Negative work-up for hemolysis (normal LDH, haptoglobin, reticulocyte count, direct [place]) - Nutritional deficiency work-up showed B12 deficiency (see below), but normal folic acid and copper; normal vitamin D - Patient stopped taking oral iron supplement due to constipation - Since his last visit, patient was started on hemodialysis and is now receiving Epogen injections - Found to have Hemoccult stool positive x3 in June 2022 - was referred to gastroenterology, but has not yet followed up with them due to frequent hospitalizations over the last few months - He denies any gross signs or symptoms of blood loss such as bright red blood per rectum or melena. - Most recent labs (02/07/2021): Hgb 10.0 with MCV 87.6 and normal differential; ferritin 377 with iron saturation 19% and low TIBC 242 (consistent with iron sequestration in the anemia of chronic disease) - PLAN: Continue Epogen injections at dialysis.  No indication for IV iron supplementation at this time.  Recommend continued follow-up with GI for possible occult GI bleeding.  Repeat CBC and iron panel in 3 months.  2.  Monoclonal gammopathy of unknown significance, under work-up - Work-up for other causes of anemia above revealed immunofixation (09/13/2020) with IgG lambda monoclonal protein; no M spike evident on SPEP; elevated lambda light chains 163.5, elevated kappa light chains 186.6, normal light chain ratio 1.14 - Skeletal survey (11/16/2020): No definite lytic destruction or lucency noted in the visualized skeleton - No definite CRAB features: Patient does have a markedly elevated creatinine due to longstanding CKD and poorly controlled diabetes/hypertension, Hgb < 10.0 thought to be due to anemia of ESRD - 24-hour urine with UPEP/IFE was ordered, but unable to be completed as patient is incontinent of urine and wears  adult briefs - PLAN: Repeat MGUS panel in 3 months. 3.  B12 deficiency - Labs on 11/04/2020 showed elevated methylmalonic acid 721, although B12 elevated at 1355 (likely reactive) - He was taking B12 pills at home, but they were not helping - He was started on monthly B12 injections - Most recent labs (02/07/2021): Elevated B12 2395 (methylmalonic acid not checked) - PLAN: We will HOLD B12 injections and repeat B12 and methylmalonic acid in 3 months.    4.  ESRD on hemodialysis - Stage IV, since 2013, secondary to diabetes. - Started on hemodialysis in July 2022 - PLAN: Continue hemodialysis and follow-up with Dr. Theador Hawthorne   PLAN SUMMARY & DISPOSITION: -Labs in 3 months - RTC after labs  All questions were answered. The patient knows to call the clinic with any problems, questions or concerns.  Medical decision making: Moderate  Time spent on visit: I spent 20 minutes counseling the patient face to face. The total time spent in the appointment was 30 minutes and more than 50% was on counseling.   [name], PA-C  02/09/2021 4:28 PM

## 2021-02-09 ENCOUNTER — Inpatient Hospital Stay (HOSPITAL_BASED_OUTPATIENT_CLINIC_OR_DEPARTMENT_OTHER): Payer: Medicare HMO | Admitting: Physician Assistant

## 2021-02-09 ENCOUNTER — Other Ambulatory Visit: Payer: Self-pay

## 2021-02-09 VITALS — BP 154/80 | HR 103 | Temp 96.7°F | Resp 18

## 2021-02-09 DIAGNOSIS — D631 Anemia in chronic kidney disease: Secondary | ICD-10-CM

## 2021-02-09 DIAGNOSIS — D508 Other iron deficiency anemias: Secondary | ICD-10-CM | POA: Diagnosis not present

## 2021-02-09 DIAGNOSIS — Z992 Dependence on renal dialysis: Secondary | ICD-10-CM | POA: Diagnosis not present

## 2021-02-09 DIAGNOSIS — D472 Monoclonal gammopathy: Secondary | ICD-10-CM | POA: Diagnosis not present

## 2021-02-09 DIAGNOSIS — E538 Deficiency of other specified B group vitamins: Secondary | ICD-10-CM | POA: Diagnosis not present

## 2021-02-09 DIAGNOSIS — E1122 Type 2 diabetes mellitus with diabetic chronic kidney disease: Secondary | ICD-10-CM | POA: Diagnosis not present

## 2021-02-09 DIAGNOSIS — N186 End stage renal disease: Secondary | ICD-10-CM | POA: Diagnosis not present

## 2021-02-09 NOTE — Patient Instructions (Signed)
[place] at [facility] Discharge Instructions  You were seen today by Tarri Abernethy PA-C for your anemia and abnormal protein.  Your blood levels have improved after you started on dialysis.  Continue Epogen at dialysis center.  We would still like you to follow-up with gastroenterology due to blood that was detected in your stools.  You are currently scheduled to see Dr. Jenetta Downer on Thursday, 04/14/2021.  Since this is one of your dialysis days, you will need to call his office to reschedule that appointment.  Your B12 levels are elevated, so we are going to stop your B12 injections at this time.  We will recheck your levels in 3 months and restart at that time if appropriate.  LABS: Return in 3 months for repeat labs  FOLLOW-UP APPOINTMENT: Office visit in 3 months, after labs   Thank you for choosing [place] at [facility] to provide your oncology and hematology care.  To afford each patient quality time with our provider, please arrive at least 15 minutes before your scheduled appointment time.   If you have a lab appointment with the [facility] please come in thru the Main Entrance and check in at the main information desk.  You need to re-schedule your appointment should you arrive 10 or more minutes late.  We strive to give you quality time with our providers, and arriving late affects you and other patients whose appointments are after yours.  Also, if you no show three or more times for appointments you may be dismissed from the clinic at the providers discretion.     Again, thank you for choosing [facility].  Our hope is that these requests will decrease the amount of time that you wait before being seen by our physicians.       _____________________________________________________________  Should you have questions after your visit to [facility], please contact our office at [phone] and  follow the prompts.  Our office hours are 8:00 a.m. and 4:30 p.m. Monday - Friday.  Please note that voicemails left after 4:00 p.m. may not be returned until the following business day.  We are closed weekends and major holidays.  You do have access to a nurse 24-7, just call the main number to the clinic [phone] and do not press any options, hold on the line and a nurse will answer the phone.    For prescription refill requests, have your pharmacy contact our office and allow 72 hours.    Due to Covid, you will need to wear a mask upon entering the hospital. If you do not have a mask, a mask will be given to you at the Main Entrance upon arrival. For doctor visits, patients may have 1 support person age 4 or older with them. For treatment visits, patients can not have anyone with them due to social distancing guidelines and our immunocompromised population.

## 2021-02-10 DIAGNOSIS — N186 End stage renal disease: Secondary | ICD-10-CM | POA: Diagnosis not present

## 2021-02-10 DIAGNOSIS — Z992 Dependence on renal dialysis: Secondary | ICD-10-CM | POA: Diagnosis not present

## 2021-02-12 DIAGNOSIS — N186 End stage renal disease: Secondary | ICD-10-CM | POA: Diagnosis not present

## 2021-02-12 DIAGNOSIS — Z992 Dependence on renal dialysis: Secondary | ICD-10-CM | POA: Diagnosis not present

## 2021-02-14 DIAGNOSIS — L03115 Cellulitis of right lower limb: Secondary | ICD-10-CM | POA: Diagnosis not present

## 2021-02-14 DIAGNOSIS — I5030 Unspecified diastolic (congestive) heart failure: Secondary | ICD-10-CM | POA: Diagnosis not present

## 2021-02-14 DIAGNOSIS — I509 Heart failure, unspecified: Secondary | ICD-10-CM | POA: Diagnosis not present

## 2021-02-14 DIAGNOSIS — R5381 Other malaise: Secondary | ICD-10-CM | POA: Diagnosis not present

## 2021-02-14 DIAGNOSIS — I739 Peripheral vascular disease, unspecified: Secondary | ICD-10-CM | POA: Diagnosis not present

## 2021-02-15 DIAGNOSIS — N186 End stage renal disease: Secondary | ICD-10-CM | POA: Diagnosis not present

## 2021-02-15 DIAGNOSIS — Z992 Dependence on renal dialysis: Secondary | ICD-10-CM | POA: Diagnosis not present

## 2021-02-16 DIAGNOSIS — M6281 Muscle weakness (generalized): Secondary | ICD-10-CM | POA: Diagnosis not present

## 2021-02-16 DIAGNOSIS — E669 Obesity, unspecified: Secondary | ICD-10-CM | POA: Diagnosis not present

## 2021-02-16 DIAGNOSIS — L89513 Pressure ulcer of right ankle, stage 3: Secondary | ICD-10-CM | POA: Diagnosis not present

## 2021-02-16 DIAGNOSIS — N184 Chronic kidney disease, stage 4 (severe): Secondary | ICD-10-CM | POA: Diagnosis not present

## 2021-02-17 DIAGNOSIS — N186 End stage renal disease: Secondary | ICD-10-CM | POA: Diagnosis not present

## 2021-02-17 DIAGNOSIS — Z992 Dependence on renal dialysis: Secondary | ICD-10-CM | POA: Diagnosis not present

## 2021-02-19 DIAGNOSIS — N186 End stage renal disease: Secondary | ICD-10-CM | POA: Diagnosis not present

## 2021-02-19 DIAGNOSIS — Z992 Dependence on renal dialysis: Secondary | ICD-10-CM | POA: Diagnosis not present

## 2021-02-23 DIAGNOSIS — Z992 Dependence on renal dialysis: Secondary | ICD-10-CM | POA: Diagnosis not present

## 2021-02-23 DIAGNOSIS — N186 End stage renal disease: Secondary | ICD-10-CM | POA: Diagnosis not present

## 2021-02-24 DIAGNOSIS — L03115 Cellulitis of right lower limb: Secondary | ICD-10-CM | POA: Diagnosis not present

## 2021-02-24 DIAGNOSIS — M6281 Muscle weakness (generalized): Secondary | ICD-10-CM | POA: Diagnosis not present

## 2021-02-24 DIAGNOSIS — D649 Anemia, unspecified: Secondary | ICD-10-CM | POA: Diagnosis not present

## 2021-02-24 DIAGNOSIS — N400 Enlarged prostate without lower urinary tract symptoms: Secondary | ICD-10-CM | POA: Diagnosis not present

## 2021-02-24 DIAGNOSIS — Z23 Encounter for immunization: Secondary | ICD-10-CM | POA: Diagnosis not present

## 2021-02-24 DIAGNOSIS — R600 Localized edema: Secondary | ICD-10-CM | POA: Diagnosis not present

## 2021-02-24 DIAGNOSIS — R972 Elevated prostate specific antigen [PSA]: Secondary | ICD-10-CM | POA: Diagnosis not present

## 2021-02-24 DIAGNOSIS — R0902 Hypoxemia: Secondary | ICD-10-CM | POA: Diagnosis not present

## 2021-02-24 DIAGNOSIS — D631 Anemia in chronic kidney disease: Secondary | ICD-10-CM | POA: Diagnosis not present

## 2021-02-24 DIAGNOSIS — R079 Chest pain, unspecified: Secondary | ICD-10-CM | POA: Diagnosis not present

## 2021-02-24 DIAGNOSIS — R52 Pain, unspecified: Secondary | ICD-10-CM | POA: Diagnosis not present

## 2021-02-24 DIAGNOSIS — I10 Essential (primary) hypertension: Secondary | ICD-10-CM | POA: Diagnosis not present

## 2021-02-24 DIAGNOSIS — Z87891 Personal history of nicotine dependence: Secondary | ICD-10-CM | POA: Diagnosis not present

## 2021-02-24 DIAGNOSIS — I259 Chronic ischemic heart disease, unspecified: Secondary | ICD-10-CM | POA: Diagnosis not present

## 2021-02-24 DIAGNOSIS — R41841 Cognitive communication deficit: Secondary | ICD-10-CM | POA: Diagnosis not present

## 2021-02-24 DIAGNOSIS — R2681 Unsteadiness on feet: Secondary | ICD-10-CM | POA: Diagnosis not present

## 2021-02-24 DIAGNOSIS — Z7401 Bed confinement status: Secondary | ICD-10-CM | POA: Diagnosis not present

## 2021-02-24 DIAGNOSIS — E1121 Type 2 diabetes mellitus with diabetic nephropathy: Secondary | ICD-10-CM | POA: Diagnosis not present

## 2021-02-24 DIAGNOSIS — R739 Hyperglycemia, unspecified: Secondary | ICD-10-CM | POA: Diagnosis not present

## 2021-02-24 DIAGNOSIS — E785 Hyperlipidemia, unspecified: Secondary | ICD-10-CM | POA: Diagnosis not present

## 2021-02-24 DIAGNOSIS — Z7901 Long term (current) use of anticoagulants: Secondary | ICD-10-CM | POA: Diagnosis not present

## 2021-02-24 DIAGNOSIS — T8249XA Other complication of vascular dialysis catheter, initial encounter: Secondary | ICD-10-CM | POA: Diagnosis not present

## 2021-02-24 DIAGNOSIS — I132 Hypertensive heart and chronic kidney disease with heart failure and with stage 5 chronic kidney disease, or end stage renal disease: Secondary | ICD-10-CM | POA: Diagnosis not present

## 2021-02-24 DIAGNOSIS — Z992 Dependence on renal dialysis: Secondary | ICD-10-CM | POA: Diagnosis not present

## 2021-02-24 DIAGNOSIS — N185 Chronic kidney disease, stage 5: Secondary | ICD-10-CM | POA: Diagnosis not present

## 2021-02-24 DIAGNOSIS — R2689 Other abnormalities of gait and mobility: Secondary | ICD-10-CM | POA: Diagnosis not present

## 2021-02-24 DIAGNOSIS — R279 Unspecified lack of coordination: Secondary | ICD-10-CM | POA: Diagnosis not present

## 2021-02-24 DIAGNOSIS — J90 Pleural effusion, not elsewhere classified: Secondary | ICD-10-CM | POA: Diagnosis not present

## 2021-02-24 DIAGNOSIS — Z5181 Encounter for therapeutic drug level monitoring: Secondary | ICD-10-CM | POA: Diagnosis not present

## 2021-02-24 DIAGNOSIS — R339 Retention of urine, unspecified: Secondary | ICD-10-CM | POA: Diagnosis not present

## 2021-02-24 DIAGNOSIS — Z043 Encounter for examination and observation following other accident: Secondary | ICD-10-CM | POA: Diagnosis not present

## 2021-02-24 DIAGNOSIS — I509 Heart failure, unspecified: Secondary | ICD-10-CM | POA: Diagnosis not present

## 2021-02-24 DIAGNOSIS — N401 Enlarged prostate with lower urinary tract symptoms: Secondary | ICD-10-CM | POA: Diagnosis not present

## 2021-02-24 DIAGNOSIS — Z743 Need for continuous supervision: Secondary | ICD-10-CM | POA: Diagnosis not present

## 2021-02-24 DIAGNOSIS — N186 End stage renal disease: Secondary | ICD-10-CM | POA: Diagnosis not present

## 2021-02-24 DIAGNOSIS — T8242XA Displacement of vascular dialysis catheter, initial encounter: Secondary | ICD-10-CM | POA: Diagnosis not present

## 2021-02-24 DIAGNOSIS — L98492 Non-pressure chronic ulcer of skin of other sites with fat layer exposed: Secondary | ICD-10-CM | POA: Diagnosis not present

## 2021-02-24 DIAGNOSIS — N138 Other obstructive and reflux uropathy: Secondary | ICD-10-CM | POA: Diagnosis not present

## 2021-02-24 DIAGNOSIS — I4891 Unspecified atrial fibrillation: Secondary | ICD-10-CM | POA: Diagnosis not present

## 2021-02-24 DIAGNOSIS — J9601 Acute respiratory failure with hypoxia: Secondary | ICD-10-CM | POA: Diagnosis not present

## 2021-02-24 DIAGNOSIS — R140 Abdominal distension (gaseous): Secondary | ICD-10-CM | POA: Diagnosis not present

## 2021-02-24 DIAGNOSIS — I5032 Chronic diastolic (congestive) heart failure: Secondary | ICD-10-CM | POA: Diagnosis not present

## 2021-02-24 DIAGNOSIS — I5030 Unspecified diastolic (congestive) heart failure: Secondary | ICD-10-CM | POA: Diagnosis not present

## 2021-02-24 DIAGNOSIS — E1122 Type 2 diabetes mellitus with diabetic chronic kidney disease: Secondary | ICD-10-CM | POA: Diagnosis not present

## 2021-02-24 DIAGNOSIS — E119 Type 2 diabetes mellitus without complications: Secondary | ICD-10-CM | POA: Diagnosis not present

## 2021-02-24 DIAGNOSIS — N184 Chronic kidney disease, stage 4 (severe): Secondary | ICD-10-CM | POA: Diagnosis not present

## 2021-02-24 DIAGNOSIS — Z79899 Other long term (current) drug therapy: Secondary | ICD-10-CM | POA: Diagnosis not present

## 2021-02-24 DIAGNOSIS — I872 Venous insufficiency (chronic) (peripheral): Secondary | ICD-10-CM | POA: Diagnosis not present

## 2021-02-24 DIAGNOSIS — I5033 Acute on chronic diastolic (congestive) heart failure: Secondary | ICD-10-CM | POA: Diagnosis not present

## 2021-02-24 DIAGNOSIS — H66003 Acute suppurative otitis media without spontaneous rupture of ear drum, bilateral: Secondary | ICD-10-CM | POA: Diagnosis not present

## 2021-02-24 DIAGNOSIS — E1169 Type 2 diabetes mellitus with other specified complication: Secondary | ICD-10-CM | POA: Diagnosis not present

## 2021-02-24 DIAGNOSIS — E213 Hyperparathyroidism, unspecified: Secondary | ICD-10-CM | POA: Diagnosis not present

## 2021-02-24 DIAGNOSIS — Z794 Long term (current) use of insulin: Secondary | ICD-10-CM | POA: Diagnosis not present

## 2021-02-24 DIAGNOSIS — R0789 Other chest pain: Secondary | ICD-10-CM | POA: Diagnosis not present

## 2021-02-24 DIAGNOSIS — I739 Peripheral vascular disease, unspecified: Secondary | ICD-10-CM | POA: Diagnosis not present

## 2021-02-24 DIAGNOSIS — E669 Obesity, unspecified: Secondary | ICD-10-CM | POA: Diagnosis not present

## 2021-02-24 DIAGNOSIS — E1140 Type 2 diabetes mellitus with diabetic neuropathy, unspecified: Secondary | ICD-10-CM | POA: Diagnosis not present

## 2021-02-24 DIAGNOSIS — L89513 Pressure ulcer of right ankle, stage 3: Secondary | ICD-10-CM | POA: Diagnosis not present

## 2021-02-25 ENCOUNTER — Emergency Department (HOSPITAL_COMMUNITY): Payer: Medicare HMO

## 2021-02-25 ENCOUNTER — Other Ambulatory Visit: Payer: Self-pay

## 2021-02-25 ENCOUNTER — Encounter (HOSPITAL_COMMUNITY): Payer: Self-pay

## 2021-02-25 ENCOUNTER — Emergency Department (HOSPITAL_COMMUNITY)
Admission: EM | Admit: 2021-02-25 | Discharge: 2021-02-25 | Disposition: A | Discharge status: Skilled Nursing Facility | Payer: Medicare HMO | Attending: Emergency Medicine | Admitting: Emergency Medicine

## 2021-02-25 DIAGNOSIS — R079 Chest pain, unspecified: Secondary | ICD-10-CM | POA: Diagnosis not present

## 2021-02-25 DIAGNOSIS — I5032 Chronic diastolic (congestive) heart failure: Secondary | ICD-10-CM | POA: Insufficient documentation

## 2021-02-25 DIAGNOSIS — I132 Hypertensive heart and chronic kidney disease with heart failure and with stage 5 chronic kidney disease, or end stage renal disease: Secondary | ICD-10-CM | POA: Diagnosis not present

## 2021-02-25 DIAGNOSIS — Z7901 Long term (current) use of anticoagulants: Secondary | ICD-10-CM | POA: Insufficient documentation

## 2021-02-25 DIAGNOSIS — Z79899 Other long term (current) drug therapy: Secondary | ICD-10-CM | POA: Diagnosis not present

## 2021-02-25 DIAGNOSIS — Z992 Dependence on renal dialysis: Secondary | ICD-10-CM | POA: Insufficient documentation

## 2021-02-25 DIAGNOSIS — Z87891 Personal history of nicotine dependence: Secondary | ICD-10-CM | POA: Diagnosis not present

## 2021-02-25 DIAGNOSIS — Z794 Long term (current) use of insulin: Secondary | ICD-10-CM | POA: Diagnosis not present

## 2021-02-25 DIAGNOSIS — N186 End stage renal disease: Secondary | ICD-10-CM | POA: Insufficient documentation

## 2021-02-25 DIAGNOSIS — I4891 Unspecified atrial fibrillation: Secondary | ICD-10-CM | POA: Diagnosis not present

## 2021-02-25 DIAGNOSIS — R0789 Other chest pain: Secondary | ICD-10-CM | POA: Diagnosis not present

## 2021-02-25 LAB — BASIC METABOLIC PANEL
Anion gap: 10 (ref 5–15)
BUN: 36 mg/dL — ABNORMAL HIGH (ref 8–23)
CO2: 26 mmol/L (ref 22–32)
Calcium: 8.5 mg/dL — ABNORMAL LOW (ref 8.9–10.3)
Chloride: 99 mmol/L (ref 98–111)
Creatinine, Ser: 2.54 mg/dL — ABNORMAL HIGH (ref 0.61–1.24)
GFR, Estimated: 27 mL/min — ABNORMAL LOW (ref >60)
Glucose, Bld: 205 mg/dL — ABNORMAL HIGH (ref 70–99)
Potassium: 3.5 mmol/L (ref 3.5–5.1)
Sodium: 135 mmol/L (ref 135–145)

## 2021-02-25 LAB — TROPONIN I (HIGH SENSITIVITY)
Troponin I (High Sensitivity): 14 ng/L (ref <18)
Troponin I (High Sensitivity): 13 ng/L (ref <18)

## 2021-02-25 LAB — CBC
HCT: 34.2 % — ABNORMAL LOW (ref 39.0–52.0)
Hemoglobin: 10.9 g/dL — ABNORMAL LOW (ref 13.0–17.0)
MCH: 27.8 pg (ref 26.0–34.0)
MCHC: 31.9 g/dL (ref 30.0–36.0)
MCV: 87.2 fL (ref 80.0–100.0)
Platelets: 212 10*3/uL (ref 150–400)
RBC: 3.92 MIL/uL — ABNORMAL LOW (ref 4.22–5.81)
RDW: 18.6 % — ABNORMAL HIGH (ref 11.5–15.5)
WBC: 12.2 10*3/uL — ABNORMAL HIGH (ref 4.0–10.5)
nRBC: 0 % (ref 0.0–0.2)

## 2021-02-25 MED ORDER — ACETAMINOPHEN 325 MG PO TABS
650.0000 mg | ORAL_TABLET | Freq: Once | ORAL | Status: AC
  Administered 2021-02-25: 650 mg via ORAL
  Filled 2021-02-25: qty 2

## 2021-02-25 NOTE — ED Triage Notes (Addendum)
Pt from [employer] brought in by EMS for substernal chest pain that started an hour ago. Pain reproducible with palpation. Pt given hydrocodone with no relief. Pain does not radiate. Chest pain hurts with arm movement per pt.

## 2021-02-25 NOTE — Discharge Instructions (Addendum)
Call your primary care doctor or specialist as discussed in the next 2-3 days.   Return immediately back to the ER if:  Your symptoms worsen within the next 12-24 hours. You develop new symptoms such as new fevers, persistent vomiting, new pain, shortness of breath, or new weakness or numbness, or if you have any other concerns.  

## 2021-02-25 NOTE — ED Provider Notes (Signed)
[facility] EMERGENCY DEPARTMENT Provider Note   CSN: [id number] Arrival date & time: 02/25/21  3329     History No chief complaint on file.   Danny [name] is a 65 y.o. male.  Patient presents with sharp aching mid chest pain that started about an hour prior to arrival.  Denies it radiating anywhere else.  Describes it as sharp and persistent but worse when he pushes on his chest or turns to his side.  Denies any fevers or cough or vomiting or diarrhea.      Past Medical History:  Diagnosis Date   Anemia    Arthritis    CKD (chronic kidney disease), stage V ([place])    Diabetes mellitus without complication ([place])    Diastolic congestive heart failure ([place])    Foot ulcer ([place])    Gout    Hypertension    Urinary retention     Patient Active Problem List   Diagnosis Date Noted   Goals of care, counseling/discussion 51/88/4166   Acute metabolic encephalopathy 12/24/1599   End stage renal disease (HCC)    Symptomatic anemia 01/12/2021   B12 deficiency 12/06/2020   Osteomyelitis of right ankle (HCC)    Cellulitis, leg 10/28/2020   CKD (chronic kidney disease) stage 4, GFR 15-29 ml/min (HCC) 10/28/2020   Moderate protein-calorie malnutrition ([place]) 10/26/2020   Medical non-compliance 10/26/2020   Chronic ulcer of right ankle (HCC)    Venous stasis    Cellulitis 10/25/2020   Normocytic anemia 09/13/2020   Class 2 obesity    Respiratory failure ([place]) 03/23/2020   UTI (urinary tract infection), bacterial 01/22/2020   Acute on chronic anemia 01/16/2020   Benign prostatic hyperplasia with urinary obstruction 01/15/2020   AKI (acute kidney injury) ([place]) 07/08/2019   Hypoxia 07/08/2019   Pressure injury of skin 07/08/2019   Edema of both lower extremities    Anasarca 03/23/2019   Bladder outlet obstruction 03/23/2019   Yeast infection of the skin 03/16/2019   Diabetic ulcer of left foot ([place]) 03/15/2019   Diabetic ulcer of ankle ([place]) 03/15/2019   Hypertension associated  with stage 3 chronic kidney disease due to type 2 diabetes mellitus ([place]) 03/09/2019   Controlled type 2 diabetes mellitus with stage 3 chronic kidney disease, with long-term current use of insulin ([place]) 03/09/2019   Type 2 diabetes with nephropathy ([place]) 03/09/2019   Chronic gout due to renal impairment without tophus 03/09/2019   Emphysematous cystitis 03/05/2019   Bilateral hydronephrosis    Bilateral cellulitis of lower leg 03/03/2019   Urinary retention 03/03/2019   Klebsiella Cystitis 03/03/2019   UTI due to Klebsiella species 03/03/2019   Plantar ulcer of left foot ([place]) 03/03/2019   Chronic diastolic CHF (congestive heart failure) ([place]) 02/25/2019   Acute respiratory failure with hypoxia ([place]) 02/25/2019   Acute renal failure superimposed on stage 4 chronic kidney disease ([place]) 02/24/2019   Congestive heart failure ([place]) 02/24/2019   Dyspnea 02/23/2019   Essential hypertension 02/23/2019   Diabetes mellitus ([place]) 02/23/2019   CKD (chronic kidney disease) 02/23/2019   Psoriasis 02/23/2019    Past Surgical History:  Procedure Laterality Date   ANKLE SURGERY Right    CHOLECYSTECTOMY     FOOT SURGERY Right    INSERTION OF DIALYSIS CATHETER Right 01/14/2021   Procedure: INSERTION OF TUNNELED DIALYSIS CATHETER RIGHT INTERNAL JUGULAR;  Surgeon: Virl Cagey, MD;  Location: [facility];  Service: General;  Laterality: Right;   TRANSURETHRAL RESECTION OF PROSTATE N/A 01/15/2020   Procedure: TRANSURETHRAL  RESECTION OF THE PROSTATE (TURP)  with General anesthesia and spinal;  Surgeon: Cleon Gustin, MD;  Location: [facility];  Service: Urology;  Laterality: N/A;       Family History  Problem Relation Age of Onset   Diabetes Mother    Heart attack Mother    Heart attack Father    Diabetes Brother     Social History   Tobacco Use   Smoking status: Former   Smokeless tobacco: Never  Scientific laboratory technician Use: Never used  Substance Use Topics   Alcohol use: Not Currently    Drug use: Never    Home Medications Prior to Admission medications   Medication Sig Start Date End Date Taking? Authorizing Provider  acetaminophen (TYLENOL) 500 MG tablet Take 1,000 mg by mouth every 6 (six) hours as needed for moderate pain or headache.    [provider]  apixaban (ELIQUIS) 5 MG TABS tablet Take 1 tablet (5 mg total) by mouth 2 (two) times daily. 01/27/21   Kathie Dike, MD  Ascorbic Acid (VITAMIN C) 500 MG CHEW Chew 1,000 mg by mouth daily.    [provider]  cholecalciferol (VITAMIN D3) 25 MCG (1000 UNIT) tablet Take 1,000 Units by mouth daily.    [provider]  collagenase (SANTYL) ointment Apply to right lateral malleolus once daily; apply in a 1/8 inch layer and top with saline moistened gauze 2X2. 11/05/20   Barton Dubois, MD  ferrous sulfate 325 (65 FE) MG tablet Take 325 mg by mouth 2 (two) times daily with a meal.    [provider]  Lactulose 20 GM/30ML SOLN Take 30 mLs (20 g total) by mouth daily. 11/11/20   [name], MD  metoprolol succinate (TOPROL-XL) 25 MG 24 hr tablet Take 1 tablet (25 mg total) by mouth in the morning and at bedtime. 01/27/21   Kathie Dike, MD  Multiple Vitamin (MULTIVITAMIN WITH MINERALS) TABS tablet Take 1 tablet by mouth daily.    [provider]  NOVOLIN 70/30 RELION (70-30) 100 UNIT/ML injection Inject 5 Units into the skin in the morning and at bedtime. 01/27/21   Kathie Dike, MD  Nutritional Supplements (FEEDING SUPPLEMENT, NEPRO CARB STEADY,) LIQD Take 237 mLs by mouth 2 (two) times daily between meals. 11/05/20   Barton Dubois, MD  Omega-3 Fatty Acids (FISH OIL PO) Take 2,400 mg by mouth 2 (two) times daily.    [provider]  polyethylene glycol (MIRALAX / GLYCOLAX) 17 g packet Take 17 g by mouth daily. 01/28/21   Kathie Dike, MD  pravastatin (PRAVACHOL) 80 MG tablet Take 1 tablet (80 mg total) by mouth every evening. 03/20/19   Gerlene Fee, NP  sevelamer  carbonate (RENVELA) 800 MG tablet Take 1 tablet (800 mg total) by mouth 2 (two) times daily with a meal. 01/27/21   Kathie Dike, MD  vitamin B-12 (CYANOCOBALAMIN) 1000 MCG tablet Take 1,000 mcg by mouth daily.    [provider]  vitamin E 1000 UNIT capsule Take 1,000 Units by mouth daily.    [provider]  Zinc 30 MG CAPS Take 1 capsule by mouth daily.    [provider]    Allergies    Dust mite extract, Prednisone, and Rocephin [ceftriaxone]  Review of Systems   Review of Systems  Constitutional:  Negative for fever.  HENT:  Negative for ear pain and sore throat.   Eyes:  Negative for pain.  Respiratory:  Negative for cough.  Cardiovascular:  Positive for chest pain.  Gastrointestinal:  Negative for abdominal pain.  Genitourinary:  Negative for flank pain.  Musculoskeletal:  Negative for back pain.  Skin:  Negative for color change and rash.  Neurological:  Negative for syncope.  All other systems reviewed and are negative.  Physical Exam Updated Vital Signs BP 117/73   Pulse (!) 104   Temp 98.2 F (36.8 C) (Oral)   Resp 18   Ht 6\' 1"  (1.854 m)   Wt 113.4 kg   SpO2 98%   BMI 32.98 kg/m   Physical Exam Constitutional:      Appearance: He is well-developed.  HENT:     Head: Normocephalic.     Nose: Nose normal.  Eyes:     Extraocular Movements: Extraocular movements intact.  Cardiovascular:     Rate and Rhythm: Normal rate.  Pulmonary:     Effort: Pulmonary effort is normal.  Musculoskeletal:     Comments: Mid chest wall tenderness on palpation.  This reproduces his pain.  Skin:    Coloration: Skin is not jaundiced.  Neurological:     Mental Status: He is alert. Mental status is at baseline.    ED Results / Procedures / Treatments   Labs (all labs ordered are listed, but only abnormal results are displayed) Labs Reviewed  BASIC METABOLIC PANEL - Abnormal; Notable for the following components:      Result Value   Glucose,  Bld 205 (*)    BUN 36 (*)    Creatinine, Ser 2.54 (*)    Calcium 8.5 (*)    GFR, Estimated 27 (*)    All other components within normal limits  CBC - Abnormal; Notable for the following components:   WBC 12.2 (*)    RBC 3.92 (*)    Hemoglobin 10.9 (*)    HCT 34.2 (*)    RDW 18.6 (*)    All other components within normal limits  TROPONIN I (HIGH SENSITIVITY)  TROPONIN I (HIGH SENSITIVITY)    EKG EKG Interpretation  Date/Time:  Friday February 25 2021 06:34:54 EDT Ventricular Rate:  115 PR Interval:    QRS Duration: 87 QT Interval:  289 QTC Calculation: 400 R Axis:   77 Text Interpretation: Atrial fibrillation Repol abnrm suggests ischemia, anterolateral Baseline wander in lead(s) V3 Confirmed by Thamas Jaegers ([id number]) on 02/25/2021 6:58:02 AM  Radiology DG Chest 2 View  Result Date: 02/25/2021 CLINICAL DATA:  Chest pain EXAM: CHEST - 2 VIEW COMPARISON:  Chest radiograph 01/14/2021 FINDINGS: A right-sided dialysis catheter is in place terminating in the right atrium. The cardiomediastinal silhouette is stable. Lung volumes are low. There are suspected trace bilateral pleural effusions with mild adjacent airspace disease. Aeration of the right base has improved since 01/14/2021. The upper lungs are well aerated. There is no pneumothorax. There is no acute osseous abnormality. IMPRESSION: Trace bilateral pleural effusions with mild adjacent airspace disease likely reflecting atelectasis; aeration of the right base has improved since 01/14/2021. Electronically Signed   By: Valetta Mole M.D.   On: 02/25/2021 07:59    Procedures Procedures   Medications Ordered in ED Medications  acetaminophen (TYLENOL) tablet 650 mg (has no administration in time range)    ED Course  I have reviewed the triage vital signs and the nursing notes.  Pertinent labs & imaging results that were available during my care of the patient were reviewed by me and considered in my medical decision making (see  chart for details).  MDM Rules/Calculators/A&P                           EKG shows A. fib mild tachycardia, patient states he has A. fib at baseline is on Eliquis.  Remainder of the work-up is unremarkable troponins are negative x2.  Patient's presentation is very atypical I doubt acute coronary syndrome with reproducible chest pain on palpation.  Advising outpatient follow-up with his doctor within the week.  Advised immediate return for worsening symptoms difficulty breathing fevers or any additional concerns.  Final Clinical Impression(s) / ED Diagnoses Final diagnoses:  Chest pain, unspecified type    Rx / DC Orders ED Discharge Orders     None        Luna Fuse, MD 02/25/21 1008

## 2021-02-26 DIAGNOSIS — Z992 Dependence on renal dialysis: Secondary | ICD-10-CM | POA: Diagnosis not present

## 2021-02-26 DIAGNOSIS — Z23 Encounter for immunization: Secondary | ICD-10-CM | POA: Diagnosis not present

## 2021-02-26 DIAGNOSIS — N186 End stage renal disease: Secondary | ICD-10-CM | POA: Diagnosis not present

## 2021-03-01 DIAGNOSIS — Z23 Encounter for immunization: Secondary | ICD-10-CM | POA: Diagnosis not present

## 2021-03-01 DIAGNOSIS — Z992 Dependence on renal dialysis: Secondary | ICD-10-CM | POA: Diagnosis not present

## 2021-03-01 DIAGNOSIS — N186 End stage renal disease: Secondary | ICD-10-CM | POA: Diagnosis not present

## 2021-03-03 DIAGNOSIS — N186 End stage renal disease: Secondary | ICD-10-CM | POA: Diagnosis not present

## 2021-03-03 DIAGNOSIS — Z794 Long term (current) use of insulin: Secondary | ICD-10-CM | POA: Diagnosis not present

## 2021-03-03 DIAGNOSIS — Z5181 Encounter for therapeutic drug level monitoring: Secondary | ICD-10-CM | POA: Diagnosis not present

## 2021-03-03 DIAGNOSIS — Z79899 Other long term (current) drug therapy: Secondary | ICD-10-CM | POA: Diagnosis not present

## 2021-03-03 DIAGNOSIS — E119 Type 2 diabetes mellitus without complications: Secondary | ICD-10-CM | POA: Diagnosis not present

## 2021-03-03 DIAGNOSIS — Z23 Encounter for immunization: Secondary | ICD-10-CM | POA: Diagnosis not present

## 2021-03-03 DIAGNOSIS — Z992 Dependence on renal dialysis: Secondary | ICD-10-CM | POA: Diagnosis not present

## 2021-03-03 DIAGNOSIS — I259 Chronic ischemic heart disease, unspecified: Secondary | ICD-10-CM | POA: Diagnosis not present

## 2021-03-04 ENCOUNTER — Other Ambulatory Visit: Payer: Self-pay

## 2021-03-04 ENCOUNTER — Other Ambulatory Visit: Payer: Medicare HMO

## 2021-03-04 DIAGNOSIS — N184 Chronic kidney disease, stage 4 (severe): Secondary | ICD-10-CM | POA: Diagnosis not present

## 2021-03-04 DIAGNOSIS — R972 Elevated prostate specific antigen [PSA]: Secondary | ICD-10-CM | POA: Diagnosis not present

## 2021-03-04 DIAGNOSIS — L89513 Pressure ulcer of right ankle, stage 3: Secondary | ICD-10-CM | POA: Diagnosis not present

## 2021-03-04 DIAGNOSIS — N401 Enlarged prostate with lower urinary tract symptoms: Secondary | ICD-10-CM

## 2021-03-04 DIAGNOSIS — M6281 Muscle weakness (generalized): Secondary | ICD-10-CM | POA: Diagnosis not present

## 2021-03-04 DIAGNOSIS — N138 Other obstructive and reflux uropathy: Secondary | ICD-10-CM

## 2021-03-04 DIAGNOSIS — E538 Deficiency of other specified B group vitamins: Secondary | ICD-10-CM

## 2021-03-04 DIAGNOSIS — E669 Obesity, unspecified: Secondary | ICD-10-CM | POA: Diagnosis not present

## 2021-03-05 DIAGNOSIS — Z23 Encounter for immunization: Secondary | ICD-10-CM | POA: Diagnosis not present

## 2021-03-05 DIAGNOSIS — N186 End stage renal disease: Secondary | ICD-10-CM | POA: Diagnosis not present

## 2021-03-05 DIAGNOSIS — Z992 Dependence on renal dialysis: Secondary | ICD-10-CM | POA: Diagnosis not present

## 2021-03-05 LAB — PSA: Prostate Specific Ag, Serum: 0.2 ng/mL (ref 0.0–4.0)

## 2021-03-07 DIAGNOSIS — I509 Heart failure, unspecified: Secondary | ICD-10-CM | POA: Diagnosis not present

## 2021-03-07 DIAGNOSIS — E785 Hyperlipidemia, unspecified: Secondary | ICD-10-CM | POA: Diagnosis not present

## 2021-03-07 DIAGNOSIS — D649 Anemia, unspecified: Secondary | ICD-10-CM | POA: Diagnosis not present

## 2021-03-07 DIAGNOSIS — E1169 Type 2 diabetes mellitus with other specified complication: Secondary | ICD-10-CM | POA: Diagnosis not present

## 2021-03-07 DIAGNOSIS — I10 Essential (primary) hypertension: Secondary | ICD-10-CM | POA: Diagnosis not present

## 2021-03-07 DIAGNOSIS — Z79899 Other long term (current) drug therapy: Secondary | ICD-10-CM | POA: Diagnosis not present

## 2021-03-07 DIAGNOSIS — I5030 Unspecified diastolic (congestive) heart failure: Secondary | ICD-10-CM | POA: Diagnosis not present

## 2021-03-07 DIAGNOSIS — Z794 Long term (current) use of insulin: Secondary | ICD-10-CM | POA: Diagnosis not present

## 2021-03-08 DIAGNOSIS — Z23 Encounter for immunization: Secondary | ICD-10-CM | POA: Diagnosis not present

## 2021-03-08 DIAGNOSIS — N186 End stage renal disease: Secondary | ICD-10-CM | POA: Diagnosis not present

## 2021-03-08 DIAGNOSIS — Z992 Dependence on renal dialysis: Secondary | ICD-10-CM | POA: Diagnosis not present

## 2021-03-09 DIAGNOSIS — J90 Pleural effusion, not elsewhere classified: Secondary | ICD-10-CM | POA: Diagnosis not present

## 2021-03-09 DIAGNOSIS — N184 Chronic kidney disease, stage 4 (severe): Secondary | ICD-10-CM | POA: Diagnosis not present

## 2021-03-09 DIAGNOSIS — M6281 Muscle weakness (generalized): Secondary | ICD-10-CM | POA: Diagnosis not present

## 2021-03-09 DIAGNOSIS — E669 Obesity, unspecified: Secondary | ICD-10-CM | POA: Diagnosis not present

## 2021-03-09 DIAGNOSIS — L89513 Pressure ulcer of right ankle, stage 3: Secondary | ICD-10-CM | POA: Diagnosis not present

## 2021-03-10 DIAGNOSIS — R52 Pain, unspecified: Secondary | ICD-10-CM | POA: Diagnosis not present

## 2021-03-10 DIAGNOSIS — N186 End stage renal disease: Secondary | ICD-10-CM | POA: Diagnosis not present

## 2021-03-10 DIAGNOSIS — H66003 Acute suppurative otitis media without spontaneous rupture of ear drum, bilateral: Secondary | ICD-10-CM | POA: Diagnosis not present

## 2021-03-10 DIAGNOSIS — I509 Heart failure, unspecified: Secondary | ICD-10-CM | POA: Diagnosis not present

## 2021-03-10 DIAGNOSIS — Z992 Dependence on renal dialysis: Secondary | ICD-10-CM | POA: Diagnosis not present

## 2021-03-10 DIAGNOSIS — E1169 Type 2 diabetes mellitus with other specified complication: Secondary | ICD-10-CM | POA: Diagnosis not present

## 2021-03-10 DIAGNOSIS — E785 Hyperlipidemia, unspecified: Secondary | ICD-10-CM | POA: Diagnosis not present

## 2021-03-10 DIAGNOSIS — I5030 Unspecified diastolic (congestive) heart failure: Secondary | ICD-10-CM | POA: Diagnosis not present

## 2021-03-10 DIAGNOSIS — Z23 Encounter for immunization: Secondary | ICD-10-CM | POA: Diagnosis not present

## 2021-03-10 DIAGNOSIS — I739 Peripheral vascular disease, unspecified: Secondary | ICD-10-CM | POA: Diagnosis not present

## 2021-03-10 DIAGNOSIS — I10 Essential (primary) hypertension: Secondary | ICD-10-CM | POA: Diagnosis not present

## 2021-03-11 ENCOUNTER — Encounter: Payer: Self-pay | Admitting: Urology

## 2021-03-11 ENCOUNTER — Other Ambulatory Visit: Payer: Self-pay

## 2021-03-11 ENCOUNTER — Ambulatory Visit (INDEPENDENT_AMBULATORY_CARE_PROVIDER_SITE_OTHER): Payer: Medicare HMO | Admitting: Urology

## 2021-03-11 ENCOUNTER — Other Ambulatory Visit: Payer: Self-pay | Admitting: *Deleted

## 2021-03-11 VITALS — BP 129/76 | HR 91

## 2021-03-11 DIAGNOSIS — R339 Retention of urine, unspecified: Secondary | ICD-10-CM | POA: Diagnosis not present

## 2021-03-11 DIAGNOSIS — N401 Enlarged prostate with lower urinary tract symptoms: Secondary | ICD-10-CM

## 2021-03-11 DIAGNOSIS — N138 Other obstructive and reflux uropathy: Secondary | ICD-10-CM

## 2021-03-11 DIAGNOSIS — R972 Elevated prostate specific antigen [PSA]: Secondary | ICD-10-CM

## 2021-03-11 DIAGNOSIS — N184 Chronic kidney disease, stage 4 (severe): Secondary | ICD-10-CM

## 2021-03-11 LAB — BLADDER SCAN AMB NON-IMAGING: Scan Result: 230

## 2021-03-11 MED ORDER — TAMSULOSIN HCL 0.4 MG PO CAPS
0.4000 mg | ORAL_CAPSULE | Freq: Every day | ORAL | 11 refills | Status: DC
Start: 2021-03-11 — End: 2021-08-16

## 2021-03-11 MED ORDER — TAMSULOSIN HCL 0.4 MG PO CAPS: 0.4000 mg | ORAL_CAPSULE | Freq: Every day | ORAL | 11 refills | Status: DC

## 2021-03-11 NOTE — Patient Instructions (Signed)
Benign Prostatic Hyperplasia Benign prostatic hyperplasia (BPH) is an enlarged prostate gland that is caused by the normal aging process and not by cancer. The prostate is a walnut-sized gland that is involved in the production of semen. It is located in front of the rectum and below the bladder. The bladder stores urine and the urethra is the tube that carries the urine out of the body. The prostate may get bigger as a man gets older. An enlarged prostate can press on the urethra. This can make it harder to pass urine. The build-up of urine in the bladder can cause infection. Back pressure and infection may progress to bladder damage and kidney (renal) failure. What are the causes? This condition is part of a normal aging process. However, not all men develop problems from this condition. If the prostate enlarges away from the urethra, urine flow will not be blocked. If it enlarges toward the urethra and compresses it, there will be problems passing urine. What increases the risk? This condition is more likely to develop in men over the age of 50 years. What are the signs or symptoms? Symptoms of this condition include: Getting up often during the night to urinate. Needing to urinate frequently during the day. Difficulty starting urine flow. Decrease in size and strength of your urine stream. Leaking (dribbling) after urinating. Inability to pass urine. This needs immediate treatment. Inability to completely empty your bladder. Pain when you pass urine. This is more common if there is also an infection. Urinary tract infection (UTI). How is this diagnosed? This condition is diagnosed based on your medical history, a physical exam, and your symptoms. Tests will also be done, such as: A post-void bladder scan. This measures any amount of urine that may remain in your bladder after you finish urinating. A digital rectal exam. In a rectal exam, your health care provider checks your prostate by  putting a lubricated, gloved finger into your rectum to feel the back of your prostate gland. This exam detects the size of your gland and any abnormal lumps or growths. An exam of your urine (urinalysis). A prostate specific antigen (PSA) screening. This is a blood test used to screen for prostate cancer. An ultrasound. This test uses sound waves to electronically produce a picture of your prostate gland. Your health care provider may refer you to a specialist in kidney and prostate diseases (urologist). How is this treated? Once symptoms begin, your health care provider will monitor your condition (active surveillance or watchful waiting). Treatment for this condition will depend on the severity of your condition. Treatment may include: Observation and yearly exams. This may be the only treatment needed if your condition and symptoms are mild. Medicines to relieve your symptoms, including: Medicines to shrink the prostate. Medicines to relax the muscle of the prostate. Surgery in severe cases. Surgery may include: Prostatectomy. In this procedure, the prostate tissue is removed completely through an open incision or with a laparoscope or robotics. Transurethral resection of the prostate (TURP). In this procedure, a tool is inserted through the opening at the tip of the penis (urethra). It is used to cut away tissue of the inner core of the prostate. The pieces are removed through the same opening of the penis. This removes the blockage. Transurethral incision (TUIP). In this procedure, small cuts are made in the prostate. This lessens the prostate's pressure on the urethra. Transurethral microwave thermotherapy (TUMT). This procedure uses microwaves to create heat. The heat destroys and removes a   small amount of prostate tissue. Transurethral needle ablation (TUNA). This procedure uses radio frequencies to destroy and remove a small amount of prostate tissue. Interstitial laser coagulation (ILC).  This procedure uses a laser to destroy and remove a small amount of prostate tissue. Transurethral electrovaporization (TUVP). This procedure uses electrodes to destroy and remove a small amount of prostate tissue. Prostatic urethral lift. This procedure inserts an implant to push the lobes of the prostate away from the urethra. Follow these instructions at home: Take over-the-counter and prescription medicines only as told by your health care provider. Monitor your symptoms for any changes. Contact your health care provider with any changes. Avoid drinking large amounts of liquid before going to bed or out in public. Avoid or reduce how much caffeine or alcohol you drink. Give yourself time when you urinate. Keep all follow-up visits as told by your health care provider. This is important. Contact a health care provider if: You have unexplained back pain. Your symptoms do not get better with treatment. You develop side effects from the medicine you are taking. Your urine becomes very dark or has a bad smell. Your lower abdomen becomes distended and you have trouble passing your urine. Get help right away if: You have a fever or chills. You suddenly cannot urinate. You feel lightheaded, or very dizzy, or you faint. There are large amounts of blood or clots in the urine. Your urinary problems become hard to manage. You develop moderate to severe low back or flank pain. The flank is the side of your body between the ribs and the hip. These symptoms may represent a serious problem that is an emergency. Do not wait to see if the symptoms will go away. Get medical help right away. Call your local emergency services (911 in the [place] Do not drive yourself to the hospital. Summary Benign prostatic hyperplasia (BPH) is an enlarged prostate that is caused by the normal aging process and not by cancer. An enlarged prostate can press on the urethra. This can make it hard to pass urine. This  condition is part of a normal aging process and is more likely to develop in men over the age of 50 years. Get help right away if you suddenly cannot urinate. This information is not intended to replace advice given to you by your health care provider. Make sure you discuss any questions you have with your health care provider. Document Revised: 09/22/2020 Document Reviewed: 02/19/2020 Elsevier Patient Education  2022 [employer]  

## 2021-03-11 NOTE — Addendum Note (Signed)
Addended by: Cleon Gustin on: 03/11/2021 11:52 AM   Modules accepted: Orders

## 2021-03-11 NOTE — Progress Notes (Signed)
post void residual=230  Urological Symptom Review  Patient is experiencing the following symptoms: none   Review of Systems  Gastrointestinal (upper)  : Negative for upper GI symptoms  Gastrointestinal (lower) : Negative for lower GI symptoms  Constitutional : Negative for symptoms  Skin: Negative for skin symptoms  Eyes: Negative for eye symptoms  Ear/Nose/Throat : Sore throat  Hematologic/Lymphatic: Negative for Hematologic/Lymphatic symptoms  Cardiovascular : Negative for cardiovascular symptoms  Respiratory : Shortness of breath  Endocrine: Negative for endocrine symptoms  Musculoskeletal: Back pain  Neurological: Negative for neurological symptoms  Psychologic: Negative for psychiatric symptoms

## 2021-03-11 NOTE — Progress Notes (Signed)
03/11/2021 11:42 AM   Danny [name] Jul 29, 1955 [id number]  Referring provider: Celene Squibb, MD [address],  [place] [id number]  Followup BPH and elevated PSA   HPI: Danny [name] is a 65yo here for followup for BPH.  PSA decreased to 0.2. He started dialysis in July 2022. He makes very little urine per day. He denies dysuria or hematuria. He is very weak since his hospitalization in July and he is in rehab. He is in a wheelchair today. He is incontinent and uses several pads per day. PVR 230cc.    PMH: Past Medical History:  Diagnosis Date   Anemia    Arthritis    CKD (chronic kidney disease), stage V ([place])    Diabetes mellitus without complication (HCC)    Diastolic congestive heart failure (HCC)    Foot ulcer ([place])    Gout    Hypertension    Urinary retention     Surgical History: Past Surgical History:  Procedure Laterality Date   ANKLE SURGERY Right    CHOLECYSTECTOMY     FOOT SURGERY Right    INSERTION OF DIALYSIS CATHETER Right 01/14/2021   Procedure: INSERTION OF TUNNELED DIALYSIS CATHETER RIGHT INTERNAL JUGULAR;  Surgeon: Virl Cagey, MD;  Location: [facility];  Service: General;  Laterality: Right;   TRANSURETHRAL RESECTION OF PROSTATE N/A 01/15/2020   Procedure: TRANSURETHRAL RESECTION OF THE PROSTATE (TURP)  with General anesthesia and spinal;  Surgeon: Cleon Gustin, MD;  Location: [facility];  Service: Urology;  Laterality: N/A;    Home Medications:  Allergies as of 03/11/2021       Reactions   Dust Mite Extract Itching, Other (See Comments)   Unknown reaction-potential shortness of breath   Prednisone Nausea And Vomiting   Rocephin [ceftriaxone] Nausea And Vomiting        Medication List        Accurate as of March 11, 2021 11:42 AM. If you have any questions, ask your nurse or doctor.          acetaminophen 500 MG tablet Commonly known as: TYLENOL Take 1,000 mg by mouth every 6 (six) hours as needed for moderate pain or  headache.   apixaban 5 MG Tabs tablet Commonly known as: ELIQUIS Take 1 tablet (5 mg total) by mouth 2 (two) times daily.   cholecalciferol 25 MCG (1000 UNIT) tablet Commonly known as: VITAMIN D3 Take 1,000 Units by mouth daily.   collagenase ointment Commonly known as: SANTYL Apply to right lateral malleolus once daily; apply in a 1/8 inch layer and top with saline moistened gauze 2X2.   feeding supplement (NEPRO CARB STEADY) Liqd Take 237 mLs by mouth 2 (two) times daily between meals.   ferrous sulfate 325 (65 FE) MG tablet Take 325 mg by mouth 2 (two) times daily with a meal.   FISH OIL PO Take 2,400 mg by mouth 2 (two) times daily.   Lactulose 20 GM/30ML Soln Take 30 mLs (20 g total) by mouth daily.   metoprolol succinate 25 MG 24 hr tablet Commonly known as: TOPROL-XL Take 1 tablet (25 mg total) by mouth in the morning and at bedtime.   multivitamin with minerals Tabs tablet Take 1 tablet by mouth daily.   NovoLIN 70/30 ReliOn (70-30) 100 UNIT/ML injection Generic drug: insulin NPH-regular Human Inject 5 Units into the skin in the morning and at bedtime.   polyethylene glycol 17 g packet Commonly known as: MIRALAX / GLYCOLAX Take 17 g  by mouth daily.   pravastatin 80 MG tablet Commonly known as: PRAVACHOL Take 1 tablet (80 mg total) by mouth every evening.   sevelamer carbonate 800 MG tablet Commonly known as: RENVELA Take 1 tablet (800 mg total) by mouth 2 (two) times daily with a meal.   vitamin B-12 1000 MCG tablet Commonly known as: CYANOCOBALAMIN Take 1,000 mcg by mouth daily.   Vitamin C 500 MG Chew Chew 1,000 mg by mouth daily.   vitamin E 1000 UNIT capsule Take 1,000 Units by mouth daily.   Zinc 30 MG Caps Take 1 capsule by mouth daily.        Allergies:  Allergies  Allergen Reactions   Dust Mite Extract Itching and Other (See Comments)    Unknown reaction-potential shortness of breath   Prednisone Nausea And Vomiting   Rocephin  [Ceftriaxone] Nausea And Vomiting    Family History: Family History  Problem Relation Age of Onset   Diabetes Mother    Heart attack Mother    Heart attack Father    Diabetes Brother     Social History:  reports that he has quit smoking. He has never used smokeless tobacco. He reports that he does not currently use alcohol. He reports that he does not use drugs.  ROS: All other review of systems were reviewed and are negative except what is noted above in HPI  Physical Exam: BP 129/76   Pulse 91   Constitutional:  Alert and oriented, No acute distress. HEENT: [place] AT, moist mucus membranes.  Trachea midline, no masses. Cardiovascular: No clubbing, cyanosis, or edema. Respiratory: Normal respiratory effort, no increased work of breathing. GI: Abdomen is soft, nontender, nondistended, no abdominal masses GU: No CVA tenderness.  Lymph: No cervical or inguinal lymphadenopathy. Skin: No rashes, bruises or suspicious lesions. Neurologic: Grossly intact, no focal deficits, moving all 4 extremities. Psychiatric: Normal mood and affect.  Laboratory Data: Lab Results  Component Value Date   WBC 12.2 (H) 02/25/2021   HGB 10.9 (L) 02/25/2021   HCT 34.2 (L) 02/25/2021   MCV 87.2 02/25/2021   PLT 212 02/25/2021    Lab Results  Component Value Date   CREATININE 2.54 (H) 02/25/2021    No results found for: PSA  No results found for: TESTOSTERONE  Lab Results  Component Value Date   HGBA1C 6.8 (H) 01/12/2021    Urinalysis    Component Value Date/Time   COLORURINE AMBER (A) 01/20/2021 1114   APPEARANCEUR TURBID (A) 01/20/2021 1114   APPEARANCEUR Clear 09/10/2020 1122   LABSPEC 1.007 01/20/2021 1114   PHURINE 9.0 (H) 01/20/2021 1114   GLUCOSEU NEGATIVE 01/20/2021 1114   HGBUR SMALL (A) 01/20/2021 1114   BILIRUBINUR NEGATIVE 01/20/2021 1114   BILIRUBINUR Negative 09/10/2020 1122   KETONESUR NEGATIVE 01/20/2021 1114   PROTEINUR >=300 (A) 01/20/2021 1114   NITRITE  NEGATIVE 01/20/2021 1114   LEUKOCYTESUR LARGE (A) 01/20/2021 1114    Lab Results  Component Value Date   LABMICR See below: 09/10/2020   WBCUA None seen 09/10/2020   LABEPIT None seen 09/10/2020   MUCUS Present 09/10/2020   BACTERIA FEW (A) 01/20/2021    Pertinent Imaging:  No results found for this or any previous visit.  No results found for this or any previous visit.  No results found for this or any previous visit.  No results found for this or any previous visit.  Results for orders placed during the hospital encounter of 07/07/19  US RENAL  Narrative CLINICAL DATA:  Acute kidney injury.  EXAM: RENAL / URINARY TRACT ULTRASOUND COMPLETE  COMPARISON:  Renal ultrasound 02/24/2019  FINDINGS: Right Kidney:  Renal measurements: 10.9 x 5.4 x 5.8 cm = volume: 182 mL. No hydronephrosis. Mildly increased renal cortical echogenicity. Redemonstrated exophytic simple appearing interpolar cyst measuring 2.4 x 2.4 x 2.5 cm.  Left Kidney:  Renal measurements: 11.8 x 6.1 x 4.9 cm = volume: 184 mL. No hydronephrosis. Mildly increased renal cortical echogenicity.  Bladder:  The bladder is collapsed around a Foley catheter.  Other:  Small volume pelvic ascites.  IMPRESSION: Increased renal cortical echogenicity bilaterally, suggestive of chronic renal parenchymal disease.  2.5 cm simple appearing right renal cyst.  No hydronephrosis.  Bladder collapsed around Foley catheter.  Small volume pelvic ascites.   Electronically Signed By: Kellie Simmering DO On: 07/08/2019 09:25  No results found for this or any previous visit.  No results found for this or any previous visit.  Results for orders placed during the hospital encounter of 01/22/20  CT Renal Stone Study  Narrative CLINICAL DATA:  Flank pain.  EXAM: CT ABDOMEN AND PELVIS WITHOUT CONTRAST  TECHNIQUE: Multidetector CT imaging of the abdomen and pelvis was performed following the standard  protocol without IV contrast.  COMPARISON:  February 28, 2019  FINDINGS: Lower chest: Very mild atelectasis is seen within the bilateral lung bases.  Small bilateral pleural effusions are seen, right greater than left.  Hepatobiliary: No focal liver abnormality is seen. Status post cholecystectomy. No biliary dilatation.  Pancreas: Unremarkable. No pancreatic ductal dilatation or surrounding inflammatory changes.  Spleen: Normal in size without focal abnormality.  Adrenals/Urinary Tract: The right adrenal gland is normal in appearance. A 3.4 cm x 3.1 cm low-attenuation left adrenal mass is seen. Kidneys are normal in size. A 2.7 cm exophytic cyst is seen along the anterolateral aspect of the mid right kidney. A subcentimeter left renal cyst is noted. A stable, approximately 1.0 cm ill-defined mildly hyperdense focus is seen within the anteromedial aspect of the mid left kidney (axial CT image 40, CT series number 2). 2 mm and 3 mm nonobstructing renal stones are seen within the right kidney. Additional 2 mm nonobstructing renal stones are seen within the lower pole of the left kidney.  A moderate to marked amount of heterogeneous hyperdense material is seen within the dependent portion of a moderately distended urinary bladder. A mild amount of intraluminal air is seen as well as the distal tip and insufflator bulb of a Foley catheter. A mild amount of inflammatory fat stranding is seen anterior to the urinary bladder. No urinary bladder wall thickening is noted.  Stomach/Bowel: Stomach is within normal limits. The appendix is not clearly identified. No evidence of bowel wall thickening, distention, or inflammatory changes.  Vascular/Lymphatic: There is mild to moderate severity calcification of the abdominal aorta and bilateral common iliac arteries. No enlarged abdominal or pelvic lymph nodes.  Reproductive: There is very mild enlargement of the prostate gland.  Other:  A 2.7 cm x 1.8 cm fat containing right inguinal hernia is seen. An additional 4.5 cm x 4.1 cm fat containing left inguinal hernia is noted.  No free fluid is seen.  Musculoskeletal: Mild subcutaneous inflammatory fat stranding is seen along the midline of the anterior pelvic wall.  Moderate severity multilevel degenerative changes are noted throughout the lumbar spine with vacuum disc phenomenon seen at the levels of L2-L3 and L3-L4.  IMPRESSION: 1. Moderate to marked amount of heterogeneous hyperdense material within the dependent  portion of a moderately distended urinary bladder. While this is suspicious for hemorrhagic material, the presence of an underlying neoplastic process cannot be excluded. Correlation with cystoscopy is recommended. 2. Bilateral nonobstructing renal stones. 3. Stable low-attenuation left adrenal mass which may represent an adrenal adenoma. 4. Small bilateral pleural effusions, right greater than left. 5. Bilateral fat containing inguinal hernias. 6. Mild to moderate severity calcification of the abdominal aorta and bilateral common iliac arteries.  Aortic Atherosclerosis (ICD10-I70.0).   Electronically Signed By: Virgina Norfolk M.D. On: 01/22/2020 09:40   Assessment & Plan:    1. Urinary retention -restart flomax 0.4mg  daily - BLADDER SCAN AMB NON-IMAGING - Urinalysis, Routine w reflex microscopic  2. Benign prostatic hyperplasia with urinary obstruction -flomax 0.4mg  daily  3. Elevated PSA -RTC 6 months with PSA   No follow-ups on file.  Nicolette Bang, MD  [facility] [place]

## 2021-03-12 DIAGNOSIS — Z23 Encounter for immunization: Secondary | ICD-10-CM | POA: Diagnosis not present

## 2021-03-12 DIAGNOSIS — Z992 Dependence on renal dialysis: Secondary | ICD-10-CM | POA: Diagnosis not present

## 2021-03-12 DIAGNOSIS — N186 End stage renal disease: Secondary | ICD-10-CM | POA: Diagnosis not present

## 2021-03-13 ENCOUNTER — Encounter (HOSPITAL_COMMUNITY): Payer: Self-pay

## 2021-03-13 ENCOUNTER — Emergency Department (HOSPITAL_COMMUNITY)
Admission: EM | Admit: 2021-03-13 | Discharge: 2021-03-13 | Disposition: A | Discharge status: Home / Self Care | Payer: Medicare HMO | Attending: Emergency Medicine | Admitting: Emergency Medicine

## 2021-03-13 ENCOUNTER — Emergency Department (HOSPITAL_COMMUNITY): Payer: Medicare HMO

## 2021-03-13 ENCOUNTER — Other Ambulatory Visit: Payer: Self-pay

## 2021-03-13 DIAGNOSIS — Z87891 Personal history of nicotine dependence: Secondary | ICD-10-CM | POA: Insufficient documentation

## 2021-03-13 DIAGNOSIS — T8242XA Displacement of vascular dialysis catheter, initial encounter: Secondary | ICD-10-CM | POA: Diagnosis not present

## 2021-03-13 DIAGNOSIS — Z7901 Long term (current) use of anticoagulants: Secondary | ICD-10-CM | POA: Insufficient documentation

## 2021-03-13 DIAGNOSIS — R600 Localized edema: Secondary | ICD-10-CM | POA: Diagnosis not present

## 2021-03-13 DIAGNOSIS — Z992 Dependence on renal dialysis: Secondary | ICD-10-CM | POA: Diagnosis not present

## 2021-03-13 DIAGNOSIS — I10 Essential (primary) hypertension: Secondary | ICD-10-CM | POA: Diagnosis not present

## 2021-03-13 DIAGNOSIS — N186 End stage renal disease: Secondary | ICD-10-CM | POA: Insufficient documentation

## 2021-03-13 DIAGNOSIS — Z743 Need for continuous supervision: Secondary | ICD-10-CM | POA: Diagnosis not present

## 2021-03-13 DIAGNOSIS — I132 Hypertensive heart and chronic kidney disease with heart failure and with stage 5 chronic kidney disease, or end stage renal disease: Secondary | ICD-10-CM | POA: Insufficient documentation

## 2021-03-13 DIAGNOSIS — R140 Abdominal distension (gaseous): Secondary | ICD-10-CM | POA: Insufficient documentation

## 2021-03-13 DIAGNOSIS — E1121 Type 2 diabetes mellitus with diabetic nephropathy: Secondary | ICD-10-CM | POA: Diagnosis not present

## 2021-03-13 DIAGNOSIS — Z794 Long term (current) use of insulin: Secondary | ICD-10-CM | POA: Diagnosis not present

## 2021-03-13 DIAGNOSIS — I5032 Chronic diastolic (congestive) heart failure: Secondary | ICD-10-CM | POA: Insufficient documentation

## 2021-03-13 DIAGNOSIS — Z79899 Other long term (current) drug therapy: Secondary | ICD-10-CM | POA: Diagnosis not present

## 2021-03-13 DIAGNOSIS — E1122 Type 2 diabetes mellitus with diabetic chronic kidney disease: Secondary | ICD-10-CM | POA: Insufficient documentation

## 2021-03-13 DIAGNOSIS — T8249XA Other complication of vascular dialysis catheter, initial encounter: Secondary | ICD-10-CM | POA: Diagnosis not present

## 2021-03-13 DIAGNOSIS — Z043 Encounter for examination and observation following other accident: Secondary | ICD-10-CM | POA: Diagnosis not present

## 2021-03-13 NOTE — Discharge Instructions (Addendum)
Your vas catheter fell out.  It is important that you follow-up with your nephrologist to discuss options for replacement.  I did message the general surgeon who placed your catheter in July.  They may reach out to you.  If not, you need to call first thing on Monday morning.

## 2021-03-13 NOTE — ED Triage Notes (Signed)
Pt arrived via [facility]. Pt states that his dialysis shunt came out. Zero noted active bleeding. Denies pain.

## 2021-03-13 NOTE — ED Provider Notes (Signed)
[facility] EMERGENCY DEPARTMENT Provider Note   CSN: [id number] Arrival date & time: 03/13/21  0105     History Chief Complaint  Patient presents with   Vascular Access Problem    Danny [name] is a 65 y.o. male.  HPI  This is a 65 year old male with history of chronic kidney disease, diabetes, hypertension who presents with dislodgment of his vascular access.  Patient reports that he had dialysis on Saturday.  He states it was not a full section because he felt constipated.  However, he does report that he had "most of his session."  He noted that sometime after his dialysis session that his vas catheter in the right chest had become dislodged.  He is not noted any significant bleeding.  He denies shortness of breath or chest pain.  He states he feels fine otherwise.  Past Medical History:  Diagnosis Date   Anemia    Arthritis    CKD (chronic kidney disease), stage V ([place])    Diabetes mellitus without complication ([place])    Diastolic congestive heart failure ([place])    Foot ulcer ([place])    Gout    Hypertension    Urinary retention     Patient Active Problem List   Diagnosis Date Noted   Goals of care, counseling/discussion 58/02/9832   Acute metabolic encephalopathy 82/50/5397   End stage renal disease (HCC)    Symptomatic anemia 01/12/2021   B12 deficiency 12/06/2020   Osteomyelitis of right ankle (HCC)    Cellulitis, leg 10/28/2020   CKD (chronic kidney disease) stage 4, GFR 15-29 ml/min (HCC) 10/28/2020   Moderate protein-calorie malnutrition ([place]) 10/26/2020   Medical non-compliance 10/26/2020   Chronic ulcer of right ankle (HCC)    Venous stasis    Cellulitis 10/25/2020   Normocytic anemia 09/13/2020   Class 2 obesity    Respiratory failure ([place]) 03/23/2020   UTI (urinary tract infection), bacterial 01/22/2020   Acute on chronic anemia 01/16/2020   Benign prostatic hyperplasia with urinary obstruction 01/15/2020   AKI (acute kidney injury) ([place]) 07/08/2019    Hypoxia 07/08/2019   Pressure injury of skin 07/08/2019   Edema of both lower extremities    Anasarca 03/23/2019   Bladder outlet obstruction 03/23/2019   Yeast infection of the skin 03/16/2019   Diabetic ulcer of left foot ([place]) 03/15/2019   Diabetic ulcer of ankle ([place]) 03/15/2019   Hypertension associated with stage 3 chronic kidney disease due to type 2 diabetes mellitus ([place]) 03/09/2019   Controlled type 2 diabetes mellitus with stage 3 chronic kidney disease, with long-term current use of insulin ([place]) 03/09/2019   Type 2 diabetes with nephropathy ([place]) 03/09/2019   Chronic gout due to renal impairment without tophus 03/09/2019   Emphysematous cystitis 03/05/2019   Bilateral hydronephrosis    Bilateral cellulitis of lower leg 03/03/2019   Urinary retention 03/03/2019   Klebsiella Cystitis 03/03/2019   UTI due to Klebsiella species 03/03/2019   Plantar ulcer of left foot ([place]) 03/03/2019   Chronic diastolic CHF (congestive heart failure) ([place]) 02/25/2019   Acute respiratory failure with hypoxia ([place]) 02/25/2019   Acute renal failure superimposed on stage 4 chronic kidney disease ([place]) 02/24/2019   Congestive heart failure ([place]) 02/24/2019   Dyspnea 02/23/2019   Essential hypertension 02/23/2019   Diabetes mellitus ([place]) 02/23/2019   CKD (chronic kidney disease) 02/23/2019   Psoriasis 02/23/2019    Past Surgical History:  Procedure Laterality Date   ANKLE SURGERY Right    CHOLECYSTECTOMY  FOOT SURGERY Right    INSERTION OF DIALYSIS CATHETER Right 01/14/2021   Procedure: INSERTION OF TUNNELED DIALYSIS CATHETER RIGHT INTERNAL JUGULAR;  Surgeon: Virl Cagey, MD;  Location: [facility];  Service: General;  Laterality: Right;   TRANSURETHRAL RESECTION OF PROSTATE N/A 01/15/2020   Procedure: TRANSURETHRAL RESECTION OF THE PROSTATE (TURP)  with General anesthesia and spinal;  Surgeon: Cleon Gustin, MD;  Location: [facility];  Service: Urology;  Laterality: N/A;        Family History  Problem Relation Age of Onset   Diabetes Mother    Heart attack Mother    Heart attack Father    Diabetes Brother     Social History   Tobacco Use   Smoking status: Former   Smokeless tobacco: Never  Scientific laboratory technician Use: Never used  Substance Use Topics   Alcohol use: Not Currently   Drug use: Never    Home Medications Prior to Admission medications   Medication Sig Start Date End Date Taking? Authorizing Provider  acetaminophen (TYLENOL) 500 MG tablet Take 1,000 mg by mouth every 6 (six) hours as needed for moderate pain or headache.    [provider]  apixaban (ELIQUIS) 5 MG TABS tablet Take 1 tablet (5 mg total) by mouth 2 (two) times daily. 01/27/21   Kathie Dike, MD  Ascorbic Acid (VITAMIN C) 500 MG CHEW Chew 1,000 mg by mouth daily.    [provider]  cholecalciferol (VITAMIN D3) 25 MCG (1000 UNIT) tablet Take 1,000 Units by mouth daily.    [provider]  collagenase (SANTYL) ointment Apply to right lateral malleolus once daily; apply in a 1/8 inch layer and top with saline moistened gauze 2X2. 11/05/20   Barton Dubois, MD  ferrous sulfate 325 (65 FE) MG tablet Take 325 mg by mouth 2 (two) times daily with a meal.    [provider]  Lactulose 20 GM/30ML SOLN Take 30 mLs (20 g total) by mouth daily. 11/11/20   [name], MD  metoprolol succinate (TOPROL-XL) 25 MG 24 hr tablet Take 1 tablet (25 mg total) by mouth in the morning and at bedtime. 01/27/21   Kathie Dike, MD  Multiple Vitamin (MULTIVITAMIN WITH MINERALS) TABS tablet Take 1 tablet by mouth daily.    [provider]  NOVOLIN 70/30 RELION (70-30) 100 UNIT/ML injection Inject 5 Units into the skin in the morning and at bedtime. 01/27/21   Kathie Dike, MD  Nutritional Supplements (FEEDING SUPPLEMENT, NEPRO CARB STEADY,) LIQD Take 237 mLs by mouth 2 (two) times daily between meals. 11/05/20   Barton Dubois, MD  Omega-3 Fatty  Acids (FISH OIL PO) Take 2,400 mg by mouth 2 (two) times daily.    [provider]  polyethylene glycol (MIRALAX / GLYCOLAX) 17 g packet Take 17 g by mouth daily. 01/28/21   Kathie Dike, MD  pravastatin (PRAVACHOL) 80 MG tablet Take 1 tablet (80 mg total) by mouth every evening. 03/20/19   Gerlene Fee, NP  sevelamer carbonate (RENVELA) 800 MG tablet Take 1 tablet (800 mg total) by mouth 2 (two) times daily with a meal. 01/27/21   Kathie Dike, MD  tamsulosin (FLOMAX) 0.4 MG CAPS capsule Take 1 capsule (0.4 mg total) by mouth daily after supper. 03/11/21   [name], [name], MD  vitamin B-12 (CYANOCOBALAMIN) 1000 MCG tablet Take 1,000 mcg by mouth daily.    [provider]  vitamin E 1000 UNIT capsule Take 1,000 Units by mouth daily.  [provider]  Zinc 30 MG CAPS Take 1 capsule by mouth daily.    [provider]    Allergies    Dust mite extract, Prednisone, and Rocephin [ceftriaxone]  Review of Systems   Review of Systems  Constitutional:  Negative for fever.  Respiratory:  Negative for shortness of breath.   Cardiovascular:  Negative for chest pain.  Skin:  Positive for wound. Negative for color change.  All other systems reviewed and are negative.  Physical Exam Updated Vital Signs BP (!) 162/94 (BP Location: Left Arm)   Pulse 97   Temp 98.3 F (36.8 C) (Oral)   Resp 18   Ht 1.854 m (6\' 1" )   Wt 113.4 kg   SpO2 100%   BMI 32.98 kg/m   Physical Exam Vitals and nursing note reviewed.  Constitutional:      Appearance: He is well-developed.     Comments: Chronically ill-appearing, obese, no acute distress  HENT:     Head: Normocephalic and atraumatic.     Nose: Nose normal.     Mouth/Throat:     Mouth: Mucous membranes are moist.  Eyes:     Pupils: Pupils are equal, round, and reactive to light.  Cardiovascular:     Rate and Rhythm: Normal rate and regular rhythm.     Heart sounds: Normal heart sounds. No murmur heard.     Comments: Vas-Cath site right upper chest, no active bleeding, no significant adjacent erythema, no crepitus Pulmonary:     Effort: Pulmonary effort is normal. No respiratory distress.     Breath sounds: Normal breath sounds. No wheezing.  Abdominal:     General: Bowel sounds are normal. There is distension.     Palpations: Abdomen is soft.     Tenderness: There is no abdominal tenderness. There is no rebound.  Musculoskeletal:     Cervical back: Neck supple.     Right lower leg: Edema present.     Left lower leg: Edema present.  Lymphadenopathy:     Cervical: No cervical adenopathy.  Skin:    General: Skin is warm and dry.  Neurological:     Mental Status: He is alert and oriented to person, place, and time.  Psychiatric:        Mood and Affect: Mood normal.    ED Results / Procedures / Treatments   Labs (all labs ordered are listed, but only abnormal results are displayed) Labs Reviewed - No data to display  EKG None  Radiology DG Chest Portable 1 View  Result Date: 03/13/2021 CLINICAL DATA:  Vas-Cath fell out of right subclavian EXAM: PORTABLE CHEST 1 VIEW COMPARISON:  02/25/2021. FINDINGS: Interval removal of right CVC. Unchanged cardiac and mediastinal silhouette. Trace pleural effusions versus pleural thickening. No focal pulmonary opacity. No acute osseous abnormality. IMPRESSION: Interval removal of previously noted right CVC. Otherwise stable chest. Electronically Signed   By: Merilyn Baba M.D.   On: 03/13/2021 01:46    Procedures Procedures   Medications Ordered in ED Medications - No data to display  ED Course  I have reviewed the triage vital signs and the nursing notes.  Pertinent labs & imaging results that were available during my care of the patient were reviewed by me and considered in my medical decision making (see chart for details).    MDM Rules/Calculators/A&P  Patient presents with dislodgment of his Vas-Cath.   Unclear how it fell out.  The site is clean.  No obvious signs or symptoms of infection.  Vital signs reviewed and only notable for blood pressure 162/94.  He has no other complaints.  I did obtain a chest x-ray to ensure no pneumothorax.  Chest x-ray without pneumo or hemothorax.  Discussed with the patient that he will obviously need new access.  Given that he had a dialysis session on Saturday, do not feel he needs this emergently; however, this will need to be urgent as of Monday.  Encouraged him to follow-up with his nephrologist and primary physician.  It appears that his Vas-Cath was placed by general surgery here at [facility].  I did message Dr. Constance Haw so that she is aware that it has fallen out and she may be able to schedule for him to have replacement.  Given that he is without other complaint, he was discharged home.  After history, exam, and medical workup I feel the patient has been appropriately medically screened and is safe for discharge home. Pertinent diagnoses were discussed with the patient. Patient was given return precautions.  Final Clinical Impression(s) / ED Diagnoses Final diagnoses:  Displacement of vascular dialysis catheter, initial encounter [facility])    Rx / DC Orders ED Discharge Orders     None        Ninetta Adelstein, Barbette Hair, MD 03/13/21 [phone]

## 2021-03-15 DIAGNOSIS — N186 End stage renal disease: Secondary | ICD-10-CM | POA: Diagnosis not present

## 2021-03-15 DIAGNOSIS — Z992 Dependence on renal dialysis: Secondary | ICD-10-CM | POA: Diagnosis not present

## 2021-03-16 ENCOUNTER — Encounter: Payer: Medicare HMO | Admitting: Vascular Surgery

## 2021-03-16 ENCOUNTER — Other Ambulatory Visit: Payer: Medicare HMO

## 2021-03-16 DIAGNOSIS — M6281 Muscle weakness (generalized): Secondary | ICD-10-CM | POA: Diagnosis not present

## 2021-03-16 DIAGNOSIS — N184 Chronic kidney disease, stage 4 (severe): Secondary | ICD-10-CM | POA: Diagnosis not present

## 2021-03-16 DIAGNOSIS — E669 Obesity, unspecified: Secondary | ICD-10-CM | POA: Diagnosis not present

## 2021-03-16 DIAGNOSIS — L89513 Pressure ulcer of right ankle, stage 3: Secondary | ICD-10-CM | POA: Diagnosis not present

## 2021-03-17 DIAGNOSIS — Z992 Dependence on renal dialysis: Secondary | ICD-10-CM | POA: Diagnosis not present

## 2021-03-17 DIAGNOSIS — Z23 Encounter for immunization: Secondary | ICD-10-CM | POA: Diagnosis not present

## 2021-03-17 DIAGNOSIS — N186 End stage renal disease: Secondary | ICD-10-CM | POA: Diagnosis not present

## 2021-03-19 DIAGNOSIS — Z992 Dependence on renal dialysis: Secondary | ICD-10-CM | POA: Diagnosis not present

## 2021-03-19 DIAGNOSIS — N186 End stage renal disease: Secondary | ICD-10-CM | POA: Diagnosis not present

## 2021-03-19 DIAGNOSIS — Z23 Encounter for immunization: Secondary | ICD-10-CM | POA: Diagnosis not present

## 2021-03-22 DIAGNOSIS — Z992 Dependence on renal dialysis: Secondary | ICD-10-CM | POA: Diagnosis not present

## 2021-03-22 DIAGNOSIS — N186 End stage renal disease: Secondary | ICD-10-CM | POA: Diagnosis not present

## 2021-03-22 DIAGNOSIS — Z23 Encounter for immunization: Secondary | ICD-10-CM | POA: Diagnosis not present

## 2021-03-23 DIAGNOSIS — M6281 Muscle weakness (generalized): Secondary | ICD-10-CM | POA: Diagnosis not present

## 2021-03-23 DIAGNOSIS — E669 Obesity, unspecified: Secondary | ICD-10-CM | POA: Diagnosis not present

## 2021-03-23 DIAGNOSIS — L89513 Pressure ulcer of right ankle, stage 3: Secondary | ICD-10-CM | POA: Diagnosis not present

## 2021-03-23 DIAGNOSIS — N184 Chronic kidney disease, stage 4 (severe): Secondary | ICD-10-CM | POA: Diagnosis not present

## 2021-03-24 DIAGNOSIS — N186 End stage renal disease: Secondary | ICD-10-CM | POA: Diagnosis not present

## 2021-03-24 DIAGNOSIS — Z992 Dependence on renal dialysis: Secondary | ICD-10-CM | POA: Diagnosis not present

## 2021-03-24 DIAGNOSIS — Z23 Encounter for immunization: Secondary | ICD-10-CM | POA: Diagnosis not present

## 2021-03-25 DIAGNOSIS — Z992 Dependence on renal dialysis: Secondary | ICD-10-CM | POA: Diagnosis not present

## 2021-03-25 DIAGNOSIS — N186 End stage renal disease: Secondary | ICD-10-CM | POA: Diagnosis not present

## 2021-03-26 DIAGNOSIS — N186 End stage renal disease: Secondary | ICD-10-CM | POA: Diagnosis not present

## 2021-03-26 DIAGNOSIS — Z992 Dependence on renal dialysis: Secondary | ICD-10-CM | POA: Diagnosis not present

## 2021-03-26 DIAGNOSIS — Z23 Encounter for immunization: Secondary | ICD-10-CM | POA: Diagnosis not present

## 2021-03-29 DIAGNOSIS — Z992 Dependence on renal dialysis: Secondary | ICD-10-CM | POA: Diagnosis not present

## 2021-03-29 DIAGNOSIS — Z23 Encounter for immunization: Secondary | ICD-10-CM | POA: Diagnosis not present

## 2021-03-29 DIAGNOSIS — N186 End stage renal disease: Secondary | ICD-10-CM | POA: Diagnosis not present

## 2021-03-30 DIAGNOSIS — L89513 Pressure ulcer of right ankle, stage 3: Secondary | ICD-10-CM | POA: Diagnosis not present

## 2021-03-30 DIAGNOSIS — M6281 Muscle weakness (generalized): Secondary | ICD-10-CM | POA: Diagnosis not present

## 2021-03-30 DIAGNOSIS — L98492 Non-pressure chronic ulcer of skin of other sites with fat layer exposed: Secondary | ICD-10-CM | POA: Diagnosis not present

## 2021-03-30 DIAGNOSIS — N184 Chronic kidney disease, stage 4 (severe): Secondary | ICD-10-CM | POA: Diagnosis not present

## 2021-03-30 DIAGNOSIS — E669 Obesity, unspecified: Secondary | ICD-10-CM | POA: Diagnosis not present

## 2021-03-30 DIAGNOSIS — L89153 Pressure ulcer of sacral region, stage 3: Secondary | ICD-10-CM | POA: Diagnosis not present

## 2021-03-31 DIAGNOSIS — R2242 Localized swelling, mass and lump, left lower limb: Secondary | ICD-10-CM | POA: Diagnosis not present

## 2021-03-31 DIAGNOSIS — G8911 Acute pain due to trauma: Secondary | ICD-10-CM | POA: Diagnosis not present

## 2021-04-02 DIAGNOSIS — Z992 Dependence on renal dialysis: Secondary | ICD-10-CM | POA: Diagnosis not present

## 2021-04-02 DIAGNOSIS — N186 End stage renal disease: Secondary | ICD-10-CM | POA: Diagnosis not present

## 2021-04-02 DIAGNOSIS — Z23 Encounter for immunization: Secondary | ICD-10-CM | POA: Diagnosis not present

## 2021-04-04 DIAGNOSIS — E1169 Type 2 diabetes mellitus with other specified complication: Secondary | ICD-10-CM | POA: Diagnosis not present

## 2021-04-04 DIAGNOSIS — I10 Essential (primary) hypertension: Secondary | ICD-10-CM | POA: Diagnosis not present

## 2021-04-04 DIAGNOSIS — M19071 Primary osteoarthritis, right ankle and foot: Secondary | ICD-10-CM | POA: Diagnosis not present

## 2021-04-04 DIAGNOSIS — I509 Heart failure, unspecified: Secondary | ICD-10-CM | POA: Diagnosis not present

## 2021-04-04 DIAGNOSIS — K5900 Constipation, unspecified: Secondary | ICD-10-CM | POA: Diagnosis not present

## 2021-04-04 DIAGNOSIS — E785 Hyperlipidemia, unspecified: Secondary | ICD-10-CM | POA: Diagnosis not present

## 2021-04-04 DIAGNOSIS — Z794 Long term (current) use of insulin: Secondary | ICD-10-CM | POA: Diagnosis not present

## 2021-04-05 DIAGNOSIS — Z992 Dependence on renal dialysis: Secondary | ICD-10-CM | POA: Diagnosis not present

## 2021-04-05 DIAGNOSIS — Z23 Encounter for immunization: Secondary | ICD-10-CM | POA: Diagnosis not present

## 2021-04-05 DIAGNOSIS — N186 End stage renal disease: Secondary | ICD-10-CM | POA: Diagnosis not present

## 2021-04-06 ENCOUNTER — Ambulatory Visit: Payer: Medicare HMO

## 2021-04-06 ENCOUNTER — Encounter: Payer: Medicare HMO | Admitting: Vascular Surgery

## 2021-04-06 DIAGNOSIS — L89513 Pressure ulcer of right ankle, stage 3: Secondary | ICD-10-CM | POA: Diagnosis not present

## 2021-04-06 DIAGNOSIS — E669 Obesity, unspecified: Secondary | ICD-10-CM | POA: Diagnosis not present

## 2021-04-06 DIAGNOSIS — M6281 Muscle weakness (generalized): Secondary | ICD-10-CM | POA: Diagnosis not present

## 2021-04-06 DIAGNOSIS — N184 Chronic kidney disease, stage 4 (severe): Secondary | ICD-10-CM | POA: Diagnosis not present

## 2021-04-07 DIAGNOSIS — N186 End stage renal disease: Secondary | ICD-10-CM | POA: Diagnosis not present

## 2021-04-07 DIAGNOSIS — Z23 Encounter for immunization: Secondary | ICD-10-CM | POA: Diagnosis not present

## 2021-04-07 DIAGNOSIS — Z992 Dependence on renal dialysis: Secondary | ICD-10-CM | POA: Diagnosis not present

## 2021-04-09 DIAGNOSIS — Z23 Encounter for immunization: Secondary | ICD-10-CM | POA: Diagnosis not present

## 2021-04-09 DIAGNOSIS — Z992 Dependence on renal dialysis: Secondary | ICD-10-CM | POA: Diagnosis not present

## 2021-04-09 DIAGNOSIS — N186 End stage renal disease: Secondary | ICD-10-CM | POA: Diagnosis not present

## 2021-04-12 DIAGNOSIS — N186 End stage renal disease: Secondary | ICD-10-CM | POA: Diagnosis not present

## 2021-04-12 DIAGNOSIS — Z23 Encounter for immunization: Secondary | ICD-10-CM | POA: Diagnosis not present

## 2021-04-12 DIAGNOSIS — Z992 Dependence on renal dialysis: Secondary | ICD-10-CM | POA: Diagnosis not present

## 2021-04-13 DIAGNOSIS — M6281 Muscle weakness (generalized): Secondary | ICD-10-CM | POA: Diagnosis not present

## 2021-04-13 DIAGNOSIS — N184 Chronic kidney disease, stage 4 (severe): Secondary | ICD-10-CM | POA: Diagnosis not present

## 2021-04-13 DIAGNOSIS — L89513 Pressure ulcer of right ankle, stage 3: Secondary | ICD-10-CM | POA: Diagnosis not present

## 2021-04-13 DIAGNOSIS — E669 Obesity, unspecified: Secondary | ICD-10-CM | POA: Diagnosis not present

## 2021-04-14 ENCOUNTER — Ambulatory Visit (INDEPENDENT_AMBULATORY_CARE_PROVIDER_SITE_OTHER): Payer: Medicare HMO | Admitting: Gastroenterology

## 2021-04-15 DIAGNOSIS — E785 Hyperlipidemia, unspecified: Secondary | ICD-10-CM | POA: Diagnosis not present

## 2021-04-15 DIAGNOSIS — E559 Vitamin D deficiency, unspecified: Secondary | ICD-10-CM | POA: Diagnosis not present

## 2021-04-15 DIAGNOSIS — D649 Anemia, unspecified: Secondary | ICD-10-CM | POA: Diagnosis not present

## 2021-04-16 DIAGNOSIS — Z23 Encounter for immunization: Secondary | ICD-10-CM | POA: Diagnosis not present

## 2021-04-16 DIAGNOSIS — N186 End stage renal disease: Secondary | ICD-10-CM | POA: Diagnosis not present

## 2021-04-16 DIAGNOSIS — Z992 Dependence on renal dialysis: Secondary | ICD-10-CM | POA: Diagnosis not present

## 2021-04-18 DIAGNOSIS — F4323 Adjustment disorder with mixed anxiety and depressed mood: Secondary | ICD-10-CM | POA: Diagnosis not present

## 2021-04-18 DIAGNOSIS — G8929 Other chronic pain: Secondary | ICD-10-CM | POA: Diagnosis not present

## 2021-04-18 DIAGNOSIS — R69 Illness, unspecified: Secondary | ICD-10-CM | POA: Diagnosis not present

## 2021-04-18 DIAGNOSIS — R053 Chronic cough: Secondary | ICD-10-CM | POA: Diagnosis not present

## 2021-04-19 DIAGNOSIS — Z992 Dependence on renal dialysis: Secondary | ICD-10-CM | POA: Diagnosis not present

## 2021-04-19 DIAGNOSIS — N186 End stage renal disease: Secondary | ICD-10-CM | POA: Diagnosis not present

## 2021-04-19 DIAGNOSIS — Z23 Encounter for immunization: Secondary | ICD-10-CM | POA: Diagnosis not present

## 2021-04-20 ENCOUNTER — Ambulatory Visit (INDEPENDENT_AMBULATORY_CARE_PROVIDER_SITE_OTHER): Payer: Medicare HMO | Admitting: Vascular Surgery

## 2021-04-20 ENCOUNTER — Encounter: Payer: Self-pay | Admitting: Vascular Surgery

## 2021-04-20 ENCOUNTER — Other Ambulatory Visit: Payer: Self-pay

## 2021-04-20 VITALS — BP 141/82 | HR 80 | Temp 97.3°F | Resp 18

## 2021-04-20 DIAGNOSIS — Z992 Dependence on renal dialysis: Secondary | ICD-10-CM | POA: Diagnosis not present

## 2021-04-20 DIAGNOSIS — L89513 Pressure ulcer of right ankle, stage 3: Secondary | ICD-10-CM | POA: Diagnosis not present

## 2021-04-20 DIAGNOSIS — M6281 Muscle weakness (generalized): Secondary | ICD-10-CM | POA: Diagnosis not present

## 2021-04-20 DIAGNOSIS — E669 Obesity, unspecified: Secondary | ICD-10-CM | POA: Diagnosis not present

## 2021-04-20 DIAGNOSIS — N184 Chronic kidney disease, stage 4 (severe): Secondary | ICD-10-CM | POA: Diagnosis not present

## 2021-04-20 DIAGNOSIS — N186 End stage renal disease: Secondary | ICD-10-CM | POA: Diagnosis not present

## 2021-04-20 NOTE — H&P (View-Only) (Signed)
[facility] [place]  Patient name: Danny [name] MRN: [id number] DOB: [birth year] Sex: male  REASON FOR CONSULT: Discuss access for hemodialysis  Nephrologist: Theador Hawthorne  HPI: Danny [name] is a 65 y.o. male, who is here today for discussion of access for hemodialysis.  He currently has hemodialysis via a right IJ tunneled hemodialysis catheter.  He had acute on chronic renal failure and had his catheter placed in July 2022.  He was discharged to a skilled nursing rehabilitation center and hopes to be discharged to home eventually.  He has dialysis at [facility].  He is right-handed.  He is not on anticoagulant.  He does not have a pacemaker.  Past Medical History:  Diagnosis Date   Anemia    Arthritis    CKD (chronic kidney disease), stage V (HCC)    Diabetes mellitus without complication (HCC)    Diastolic congestive heart failure (HCC)    Foot ulcer ([place])    Gout    Hypertension    Urinary retention     Family History  Problem Relation Age of Onset   Diabetes Mother    Heart attack Mother    Heart attack Father    Diabetes Brother     SOCIAL HISTORY: Social History   Socioeconomic History   Marital status: Divorced    Spouse name: Not on file   Number of children: Not on file   Years of education: Not on file   Highest education level: Not on file  Occupational History   Not on file  Tobacco Use   Smoking status: Former   Smokeless tobacco: Never  Vaping Use   Vaping Use: Never used  Substance and Sexual Activity   Alcohol use: Not Currently   Drug use: Never   Sexual activity: Not Currently  Other Topics Concern   Not on file  Social History Narrative   Not on file   Social Determinants of Health   Financial Resource Strain: Low Risk    Difficulty of Paying Living Expenses: Not very hard  Food Insecurity: No Food Insecurity   Worried About Charity fundraiser in the Last Year: Never true   Ran Out  of Food in the Last Year: Never true  Transportation Needs: No Transportation Needs   Lack of Transportation (Medical): No   Lack of Transportation (Non-Medical): No  Physical Activity: Inactive   Days of Exercise per Week: 0 days   Minutes of Exercise per Session: 0 min  Stress: No Stress Concern Present   Feeling of Stress : Only a little  Social Connections: Not on file  Intimate Partner Violence: Not At Risk   Fear of Current or Ex-Partner: No   Emotionally Abused: No   Physically Abused: No   Sexually Abused: No    Allergies  Allergen Reactions   Dust Mite Extract Itching and Other (See Comments)    Unknown reaction-potential shortness of breath   Prednisone Nausea And Vomiting   Rocephin [Ceftriaxone] Nausea And Vomiting    Current Outpatient Medications  Medication Sig Dispense Refill   acetaminophen (TYLENOL) 500 MG tablet Take 1,000 mg by mouth every 6 (six) hours as needed for moderate pain or headache.     apixaban (ELIQUIS) 5 MG TABS tablet Take 1 tablet (5 mg total) by mouth 2 (two) times daily. 60 tablet    Ascorbic Acid (VITAMIN C) 500 MG CHEW Chew 1,000 mg by mouth daily.     cholecalciferol (VITAMIN D3) 25  MCG (1000 UNIT) tablet Take 1,000 Units by mouth daily.     collagenase (SANTYL) ointment Apply to right lateral malleolus once daily; apply in a 1/8 inch layer and top with saline moistened gauze 2X2.  0   docusate sodium (COLACE) 100 MG capsule Take 100 mg by mouth 2 (two) times daily.     ferrous sulfate 325 (65 FE) MG tablet Take 325 mg by mouth 2 (two) times daily with a meal.     Lactulose 20 GM/30ML SOLN Take 30 mLs (20 g total) by mouth daily. 450 mL 1   metoprolol succinate (TOPROL-XL) 25 MG 24 hr tablet Take 1 tablet (25 mg total) by mouth in the morning and at bedtime.     Multiple Vitamin (MULTIVITAMIN WITH MINERALS) TABS tablet Take 1 tablet by mouth daily.     NOVOLIN 70/30 RELION (70-30) 100 UNIT/ML injection Inject 5 Units into the skin in the  morning and at bedtime. 10 mL 11   Nutritional Supplements (FEEDING SUPPLEMENT, NEPRO CARB STEADY,) LIQD Take 237 mLs by mouth 2 (two) times daily between meals.  0   Omega-3 Fatty Acids (FISH OIL PO) Take 2,400 mg by mouth 2 (two) times daily.     polyethylene glycol (MIRALAX / GLYCOLAX) 17 g packet Take 17 g by mouth daily. 14 each 0   rosuvastatin (CRESTOR) 5 MG tablet Take 5 mg by mouth daily.     sevelamer carbonate (RENVELA) 800 MG tablet Take 1 tablet (800 mg total) by mouth 2 (two) times daily with a meal.     tamsulosin (FLOMAX) 0.4 MG CAPS capsule Take 1 capsule (0.4 mg total) by mouth daily after supper. 30 capsule 11   vitamin B-12 (CYANOCOBALAMIN) 1000 MCG tablet Take 1,000 mcg by mouth daily.     vitamin E 1000 UNIT capsule Take 1,000 Units by mouth daily.     Zinc 30 MG CAPS Take 1 capsule by mouth daily.     No current facility-administered medications for this visit.    REVIEW OF SYSTEMS:  [X]  denotes positive finding, [ ]  denotes negative finding Cardiac  Comments:  Chest pain or chest pressure:    Shortness of breath upon exertion:    Short of breath when lying flat:    Irregular heart rhythm:        Vascular    Pain in calf, thigh, or hip brought on by ambulation:    Pain in feet at night that wakes you up from your sleep:     Blood clot in your veins:    Leg swelling:         Pulmonary    Oxygen at home:    Productive cough:     Wheezing:         Neurologic    Sudden weakness in arms or legs:     Sudden numbness in arms or legs:     Sudden onset of difficulty speaking or slurred speech:    Temporary loss of vision in one eye:     Problems with dizziness:         Gastrointestinal    Blood in stool:     Vomited blood:         Genitourinary    Burning when urinating:     Blood in urine:        Psychiatric    Major depression:         Hematologic    Bleeding problems:    Problems with blood  clotting too easily:        Skin    Rashes or ulcers:         Constitutional    Fever or chills:      PHYSICAL EXAM: Vitals:   04/20/21 1304  BP: (!) 141/82  Pulse: 80  Resp: 18  Temp: (!) 97.3 F (36.3 C)  TempSrc: Temporal  SpO2: 98%    GENERAL: The patient is a well-nourished male, in no acute distress. The vital signs are documented above. CARDIOVASCULAR: 2+ radial pulses bilaterally.  Moderate surface veins bilaterally. PULMONARY: There is good air exchange  MUSCULOSKELETAL: There are no major deformities or cyanosis. NEUROLOGIC: No focal weakness or paresthesias are detected. SKIN: There are no ulcers or rashes noted. PSYCHIATRIC: The patient has a normal affect.  DATA:  SonoSite ultrasound was used to image the veins in his left arm.  He does have branching above the level of the cephalic vein at the wrist on the left.  The vein is of moderate size throughout his forearm and he has good caliber cephalic vein at the antecubital space and in the upper arm.  MEDICAL ISSUES: Had long discussion with the patient regarding options for hemodialysis.  I discussed the use of catheter and risk of this.  Also discussed AV graft and AV fistulas.  He does appear to have adequate cephalic vein on the left arm for fistula attempt.  I discussed the possibility of not maturation as well.  He currently has hemodialysis on Tuesday Thursday and Saturday.  He understands that I do dialysis access at [facility] on Tuesdays.  We will coordinate shifting his dialysis date away from this coming Tuesday and plan surgery on 04/26/2021 as an outpatient   Rosetta Posner, MD [facility] [phone] Pager [phone]  Note: Portions of this report may have been transcribed using voice recognition software.  Every effort has been made to ensure accuracy; however, inadvertent computerized transcription errors may still be present.

## 2021-04-20 NOTE — Progress Notes (Signed)
[facility] [place]  Patient name: Danny [name] MRN: [id number] DOB: 11-Oct-1955 Sex: male  REASON FOR CONSULT: Discuss access for hemodialysis  Nephrologist: Theador Hawthorne  HPI: Danny [name] is a 65 y.o. male, who is here today for discussion of access for hemodialysis.  He currently has hemodialysis via a right IJ tunneled hemodialysis catheter.  He had acute on chronic renal failure and had his catheter placed in July 2022.  He was discharged to a skilled nursing rehabilitation center and hopes to be discharged to home eventually.  He has dialysis at [facility].  He is right-handed.  He is not on anticoagulant.  He does not have a pacemaker.  Past Medical History:  Diagnosis Date   Anemia    Arthritis    CKD (chronic kidney disease), stage V (HCC)    Diabetes mellitus without complication (HCC)    Diastolic congestive heart failure (HCC)    Foot ulcer ([place])    Gout    Hypertension    Urinary retention     Family History  Problem Relation Age of Onset   Diabetes Mother    Heart attack Mother    Heart attack Father    Diabetes Brother     SOCIAL HISTORY: Social History   Socioeconomic History   Marital status: Divorced    Spouse name: Not on file   Number of children: Not on file   Years of education: Not on file   Highest education level: Not on file  Occupational History   Not on file  Tobacco Use   Smoking status: Former   Smokeless tobacco: Never  Vaping Use   Vaping Use: Never used  Substance and Sexual Activity   Alcohol use: Not Currently   Drug use: Never   Sexual activity: Not Currently  Other Topics Concern   Not on file  Social History Narrative   Not on file   Social Determinants of Health   Financial Resource Strain: Low Risk    Difficulty of Paying Living Expenses: Not very hard  Food Insecurity: No Food Insecurity   Worried About Charity fundraiser in the Last Year: Never true   Ran Out  of Food in the Last Year: Never true  Transportation Needs: No Transportation Needs   Lack of Transportation (Medical): No   Lack of Transportation (Non-Medical): No  Physical Activity: Inactive   Days of Exercise per Week: 0 days   Minutes of Exercise per Session: 0 min  Stress: No Stress Concern Present   Feeling of Stress : Only a little  Social Connections: Not on file  Intimate Partner Violence: Not At Risk   Fear of Current or Ex-Partner: No   Emotionally Abused: No   Physically Abused: No   Sexually Abused: No    Allergies  Allergen Reactions   Dust Mite Extract Itching and Other (See Comments)    Unknown reaction-potential shortness of breath   Prednisone Nausea And Vomiting   Rocephin [Ceftriaxone] Nausea And Vomiting    Current Outpatient Medications  Medication Sig Dispense Refill   acetaminophen (TYLENOL) 500 MG tablet Take 1,000 mg by mouth every 6 (six) hours as needed for moderate pain or headache.     apixaban (ELIQUIS) 5 MG TABS tablet Take 1 tablet (5 mg total) by mouth 2 (two) times daily. 60 tablet    Ascorbic Acid (VITAMIN C) 500 MG CHEW Chew 1,000 mg by mouth daily.     cholecalciferol (VITAMIN D3) 25  MCG (1000 UNIT) tablet Take 1,000 Units by mouth daily.     collagenase (SANTYL) ointment Apply to right lateral malleolus once daily; apply in a 1/8 inch layer and top with saline moistened gauze 2X2.  0   docusate sodium (COLACE) 100 MG capsule Take 100 mg by mouth 2 (two) times daily.     ferrous sulfate 325 (65 FE) MG tablet Take 325 mg by mouth 2 (two) times daily with a meal.     Lactulose 20 GM/30ML SOLN Take 30 mLs (20 g total) by mouth daily. 450 mL 1   metoprolol succinate (TOPROL-XL) 25 MG 24 hr tablet Take 1 tablet (25 mg total) by mouth in the morning and at bedtime.     Multiple Vitamin (MULTIVITAMIN WITH MINERALS) TABS tablet Take 1 tablet by mouth daily.     NOVOLIN 70/30 RELION (70-30) 100 UNIT/ML injection Inject 5 Units into the skin in the  morning and at bedtime. 10 mL 11   Nutritional Supplements (FEEDING SUPPLEMENT, NEPRO CARB STEADY,) LIQD Take 237 mLs by mouth 2 (two) times daily between meals.  0   Omega-3 Fatty Acids (FISH OIL PO) Take 2,400 mg by mouth 2 (two) times daily.     polyethylene glycol (MIRALAX / GLYCOLAX) 17 g packet Take 17 g by mouth daily. 14 each 0   rosuvastatin (CRESTOR) 5 MG tablet Take 5 mg by mouth daily.     sevelamer carbonate (RENVELA) 800 MG tablet Take 1 tablet (800 mg total) by mouth 2 (two) times daily with a meal.     tamsulosin (FLOMAX) 0.4 MG CAPS capsule Take 1 capsule (0.4 mg total) by mouth daily after supper. 30 capsule 11   vitamin B-12 (CYANOCOBALAMIN) 1000 MCG tablet Take 1,000 mcg by mouth daily.     vitamin E 1000 UNIT capsule Take 1,000 Units by mouth daily.     Zinc 30 MG CAPS Take 1 capsule by mouth daily.     No current facility-administered medications for this visit.    REVIEW OF SYSTEMS:  [X]  denotes positive finding, [ ]  denotes negative finding Cardiac  Comments:  Chest pain or chest pressure:    Shortness of breath upon exertion:    Short of breath when lying flat:    Irregular heart rhythm:        Vascular    Pain in calf, thigh, or hip brought on by ambulation:    Pain in feet at night that wakes you up from your sleep:     Blood clot in your veins:    Leg swelling:         Pulmonary    Oxygen at home:    Productive cough:     Wheezing:         Neurologic    Sudden weakness in arms or legs:     Sudden numbness in arms or legs:     Sudden onset of difficulty speaking or slurred speech:    Temporary loss of vision in one eye:     Problems with dizziness:         Gastrointestinal    Blood in stool:     Vomited blood:         Genitourinary    Burning when urinating:     Blood in urine:        Psychiatric    Major depression:         Hematologic    Bleeding problems:    Problems with blood  clotting too easily:        Skin    Rashes or ulcers:         Constitutional    Fever or chills:      PHYSICAL EXAM: Vitals:   04/20/21 1304  BP: (!) 141/82  Pulse: 80  Resp: 18  Temp: (!) 97.3 F (36.3 C)  TempSrc: Temporal  SpO2: 98%    GENERAL: The patient is a well-nourished male, in no acute distress. The vital signs are documented above. CARDIOVASCULAR: 2+ radial pulses bilaterally.  Moderate surface veins bilaterally. PULMONARY: There is good air exchange  MUSCULOSKELETAL: There are no major deformities or cyanosis. NEUROLOGIC: No focal weakness or paresthesias are detected. SKIN: There are no ulcers or rashes noted. PSYCHIATRIC: The patient has a normal affect.  DATA:  SonoSite ultrasound was used to image the veins in his left arm.  He does have branching above the level of the cephalic vein at the wrist on the left.  The vein is of moderate size throughout his forearm and he has good caliber cephalic vein at the antecubital space and in the upper arm.  MEDICAL ISSUES: Had long discussion with the patient regarding options for hemodialysis.  I discussed the use of catheter and risk of this.  Also discussed AV graft and AV fistulas.  He does appear to have adequate cephalic vein on the left arm for fistula attempt.  I discussed the possibility of not maturation as well.  He currently has hemodialysis on Tuesday Thursday and Saturday.  He understands that I do dialysis access at [facility] on Tuesdays.  We will coordinate shifting his dialysis date away from this coming Tuesday and plan surgery on 04/26/2021 as an outpatient   Rosetta Posner, MD [facility] [phone] Pager [phone]  Note: Portions of this report may have been transcribed using voice recognition software.  Every effort has been made to ensure accuracy; however, inadvertent computerized transcription errors may still be present.

## 2021-04-21 ENCOUNTER — Ambulatory Visit (INDEPENDENT_AMBULATORY_CARE_PROVIDER_SITE_OTHER): Payer: Medicare HMO | Admitting: Gastroenterology

## 2021-04-21 ENCOUNTER — Other Ambulatory Visit: Payer: Self-pay

## 2021-04-21 ENCOUNTER — Encounter (INDEPENDENT_AMBULATORY_CARE_PROVIDER_SITE_OTHER): Payer: Self-pay | Admitting: Gastroenterology

## 2021-04-21 DIAGNOSIS — M6281 Muscle weakness (generalized): Secondary | ICD-10-CM | POA: Diagnosis not present

## 2021-04-21 DIAGNOSIS — J9601 Acute respiratory failure with hypoxia: Secondary | ICD-10-CM | POA: Diagnosis not present

## 2021-04-21 DIAGNOSIS — R2689 Other abnormalities of gait and mobility: Secondary | ICD-10-CM | POA: Diagnosis not present

## 2021-04-21 DIAGNOSIS — R279 Unspecified lack of coordination: Secondary | ICD-10-CM | POA: Diagnosis not present

## 2021-04-21 DIAGNOSIS — Z992 Dependence on renal dialysis: Secondary | ICD-10-CM | POA: Diagnosis not present

## 2021-04-21 DIAGNOSIS — N185 Chronic kidney disease, stage 5: Secondary | ICD-10-CM | POA: Diagnosis not present

## 2021-04-21 DIAGNOSIS — D509 Iron deficiency anemia, unspecified: Secondary | ICD-10-CM

## 2021-04-21 DIAGNOSIS — Z23 Encounter for immunization: Secondary | ICD-10-CM | POA: Diagnosis not present

## 2021-04-21 DIAGNOSIS — N186 End stage renal disease: Secondary | ICD-10-CM | POA: Diagnosis not present

## 2021-04-21 DIAGNOSIS — I259 Chronic ischemic heart disease, unspecified: Secondary | ICD-10-CM | POA: Diagnosis not present

## 2021-04-21 NOTE — Patient Instructions (Signed)
Schedule EGD and colonoscopy We will reach the prescribing provider to obtain clearance prior to stopping blood thinner prior to procedure. Start taking Miralax 1 capful/pack every day  Continue oral iron twice a day Follow up with hematology

## 2021-04-21 NOTE — Progress Notes (Signed)
Danny [name], M.D. Gastroenterology & Hepatology [facility] [address] [place], [place] [address] Primary Care Physician: Celene Squibb, MD [address] [name] [address]  Referring MD: Casey Burkitt, PA  Chief Complaint: Anemia  History of Present Illness: Danny [name] is a 65 y.o. male with past medical history of end-stage renal disease on dialysis TTS, diabetes, diastolic heart failure, atrial fibrillation on Eliquis,  MGUS, hypertension, gout, who presents for evaluation of anemia.  Patient reprots that he has seen some small amount of fresh blood when he has to strain significantly to move his bowels. He states this is not frequent but reports this has happened for several months.He reports having frequent bloating in his abdomen. He takes Miralax as needed but not every day. He believes having more regular and softer Bms help decrease the bleeding episodes. Only feels lightheaded when having dialysis.  The patient denies having any nausea, vomiting, fever, chills, melena, hematemesis, abdominal pain, diarrhea, jaundice, pruritus or weight loss.  Patient is currently on Epogen during his dialysis sessions which were started in late July 2022.  He was referred by hematology clinic after being found to have worsening anemia and positive FOBT x3 in June 2022.  Upon review of his medical record, he was noted to have a decline in his hemoglobin since 2020, with a hemoglobin that has fluctuated between 9.9 and 7.2.  Has not required any blood transfusions.  Overall, his MCV has steadily remained above 80 through the years.However, his iron  has fluctuated between the low 30s and mid 50s, with a normal ferritin through the years.  Diarrhea saturation has been on the low side with values fluctuating between 11 and 19.  Folic acid has been normal above 14.  Most recent blood work-up from Nov 07, 2020 showed an iron of 45,  saturation of 19%, ferritin increased to 377, TIBC 242.  CBC on 02/25/2021 showed a hemoglobin of 10.9, platelets of 212 and white cell count of 12.2.  Based on records brought from nursing home, the patient is taking oral iron 325 mg twice day.  Last VZC:HYIFO Last Colonoscopy:> 10 years ago, had polyps in the past per patient but no report is available.  FHx: neg for any gastrointestinal/liver disease, sister had cancer but unknown location Social: neg smoking, alcohol or illicit drug use Surgical: no abdominal surgeries  Past Medical History: Past Medical History:  Diagnosis Date   Anemia    Arthritis    CKD (chronic kidney disease), stage V (HCC)    Diabetes mellitus without complication (HCC)    Diastolic congestive heart failure (HCC)    Foot ulcer ([place])    Gout    Hypertension    Urinary retention     Past Surgical History: Past Surgical History:  Procedure Laterality Date   ANKLE SURGERY Right    CHOLECYSTECTOMY     FOOT SURGERY Right    INSERTION OF DIALYSIS CATHETER Right 01/14/2021   Procedure: INSERTION OF TUNNELED DIALYSIS CATHETER RIGHT INTERNAL JUGULAR;  Surgeon: Virl Cagey, MD;  Location: [facility];  Service: General;  Laterality: Right;   TRANSURETHRAL RESECTION OF PROSTATE N/A 01/15/2020   Procedure: TRANSURETHRAL RESECTION OF THE PROSTATE (TURP)  with General anesthesia and spinal;  Surgeon: Cleon Gustin, MD;  Location: [facility];  Service: Urology;  Laterality: N/A;    Family History: Family History  Problem Relation Age of Onset   Diabetes Mother  Heart attack Mother    Heart attack Father    Diabetes Brother     Social History: Social History   Tobacco Use  Smoking Status Former  Smokeless Tobacco Never   Social History   Substance and Sexual Activity  Alcohol Use Not Currently   Social History   Substance and Sexual Activity  Drug Use Never    Allergies: Allergies  Allergen Reactions   Dust Mite Extract Itching and Other  (See Comments)    Unknown reaction-potential shortness of breath   Prednisone Nausea And Vomiting   Rocephin [Ceftriaxone] Nausea And Vomiting    Medications: Current Outpatient Medications  Medication Sig Dispense Refill   acetaminophen (TYLENOL) 500 MG tablet Take 1,000 mg by mouth every 6 (six) hours as needed for moderate pain or headache.     apixaban (ELIQUIS) 5 MG TABS tablet Take 1 tablet (5 mg total) by mouth 2 (two) times daily. 60 tablet    Ascorbic Acid (VITAMIN C) 500 MG CHEW Chew 1,000 mg by mouth daily.     cholecalciferol (VITAMIN D3) 25 MCG (1000 UNIT) tablet Take 1,000 Units by mouth daily.     docusate sodium (COLACE) 100 MG capsule Take 100 mg by mouth 2 (two) times daily.     ferrous sulfate 325 (65 FE) MG tablet Take 325 mg by mouth 2 (two) times daily with a meal.     Insulin NPH Isophane & Regular (HUMULIN 70/30 KWIKPEN [place]) Inject 5 Units into the skin 2 (two) times daily.     Lactulose 20 GM/30ML SOLN Take 30 mLs (20 g total) by mouth daily. (Patient taking differently: Take 30 mLs by mouth 2 (two) times daily.) 450 mL 1   metoprolol succinate (TOPROL-XL) 25 MG 24 hr tablet Take 1 tablet (25 mg total) by mouth in the morning and at bedtime.     Multiple Vitamin (MULTIVITAMIN WITH MINERALS) TABS tablet Take 1 tablet by mouth daily.     Nutritional Supplements (FEEDING SUPPLEMENT, NEPRO CARB STEADY,) LIQD Take 237 mLs by mouth 2 (two) times daily between meals.  0   Omega-3 Fatty Acids (FISH OIL PO) Take 2,400 mg by mouth 2 (two) times daily.     ondansetron (ZOFRAN) 4 MG tablet Take 4 mg by mouth every 6 (six) hours. Prn.     oxycodone (OXY-IR) 5 MG capsule Take 5 mg by mouth every 6 (six) hours as needed.     polyethylene glycol (MIRALAX / GLYCOLAX) 17 g packet Take 17 g by mouth daily. 14 each 0   rosuvastatin (CRESTOR) 5 MG tablet Take 5 mg by mouth daily.     senna (SENOKOT) 8.6 MG tablet Take 1 tablet by mouth 2 (two) times daily.     sevelamer carbonate  (RENVELA) 800 MG tablet Take 1 tablet (800 mg total) by mouth 2 (two) times daily with a meal.     sodium phosphate Pediatric (FLEET) 3.5-9.5 GM/59ML enema Place 1 enema rectally once as needed for severe constipation.     vitamin B-12 (CYANOCOBALAMIN) 1000 MCG tablet Take 1,000 mcg by mouth daily.     vitamin E 1000 UNIT capsule Take 1,000 Units by mouth daily.     collagenase (SANTYL) ointment Apply to right lateral malleolus once daily; apply in a 1/8 inch layer and top with saline moistened gauze 2X2.  0   tamsulosin (FLOMAX) 0.4 MG CAPS capsule Take 1 capsule (0.4 mg total) by mouth daily after supper. 30 capsule 11   Zinc 30  MG CAPS Take 1 capsule by mouth daily.     No current facility-administered medications for this visit.    Review of Systems: GENERAL: negative for malaise, night sweats HEENT: No changes in hearing or vision, no nose bleeds or other nasal problems. NECK: Negative for lumps, goiter, pain and significant neck swelling RESPIRATORY: Negative for cough, wheezing CARDIOVASCULAR: Negative for chest pain, leg swelling, palpitations, orthopnea GI: SEE HPI MUSCULOSKELETAL: Negative for joint pain or swelling, back pain, and muscle pain. SKIN: Negative for lesions, rash PSYCH: Negative for sleep disturbance, mood disorder and recent psychosocial stressors. HEMATOLOGY Negative for prolonged bleeding, bruising easily, and swollen nodes. ENDOCRINE: Negative for cold or heat intolerance, polyuria, polydipsia and goiter. NEURO: negative for tremor, gait imbalance, syncope and seizures. The remainder of the review of systems is noncontributory.   Physical Exam: BP 120/71 (BP Location: Left Arm, Patient Position: Sitting, Cuff Size: Large)   Pulse (!) 57   Temp 98.3 F (36.8 C) (Oral)   Ht 6\' 1"  (1.854 m)   BMI 32.98 kg/m  GENERAL: The patient is AO x3, in no acute distress. Sitting in wheelchair. Obese. HEENT: Head is normocephalic and atraumatic. EOMI are intact. Mouth  is well hydrated and without lesions. NECK: Supple. No masses LUNGS: Clear to auscultation. No presence of rhonchi/wheezing/rales. Adequate chest expansion HEART: RRR, normal s1 and s2. ABDOMEN: Soft, nontender, no guarding, no peritoneal signs, and nondistended. BS +. No masses. EXTREMITIES: Without any cyanosis, clubbing, rash, lesions or edema. NEUROLOGIC: AOx3, no focal motor deficit. SKIN: no jaundice, no rashes   Imaging/Labs: as above  I personally reviewed and interpreted the available labs, imaging and endoscopic files.  Impression and Plan: JAYCUB NOORANI is a 65 y.o. male with past medical history of end-stage renal disease on dialysis TTS, diabetes, diastolic heart failure, atrial fibrillation on Eliquis,  MGUS, hypertension, gout, who presents for evaluation of anemia.  The patient has presented worsening anemia, based on previous blood work-up it is likely this is a combination of anemia of chronic disease and some iron deficiency anemia.  He is currently taking oral iron and tolerating it adequately which he needs to continue.  We will evaluate for any gastrointestinal losses with esophagogastroduodenospy and colonoscopy.  Patient understood and agreed with this, he will need to follow-up with hematology regarding his anemia.  As he has presented some episodes of constipation with the intake of iron, I advised him to start taking MiraLAX on a standing daily basis .  - Schedule EGD and colonoscopy - We will reach the prescribing provider to obtain clearance prior to stopping blood thinner prior to procedure. - Start taking Miralax 1 capful/pack every day  - Continue oral iron twice a day - Follow up with hematology  All questions were answered.      Danny Peppers, MD Gastroenterology and Hepatology [facility]

## 2021-04-22 ENCOUNTER — Encounter (HOSPITAL_COMMUNITY)
Admission: RE | Admit: 2021-04-22 | Discharge: 2021-04-22 | Disposition: A | Discharge status: Home / Self Care | Payer: Medicare HMO | Source: Ambulatory Visit | Attending: Vascular Surgery | Admitting: Vascular Surgery

## 2021-04-22 DIAGNOSIS — R279 Unspecified lack of coordination: Secondary | ICD-10-CM | POA: Diagnosis not present

## 2021-04-22 DIAGNOSIS — J9601 Acute respiratory failure with hypoxia: Secondary | ICD-10-CM | POA: Diagnosis not present

## 2021-04-22 DIAGNOSIS — M6281 Muscle weakness (generalized): Secondary | ICD-10-CM | POA: Diagnosis not present

## 2021-04-22 DIAGNOSIS — R2689 Other abnormalities of gait and mobility: Secondary | ICD-10-CM | POA: Diagnosis not present

## 2021-04-22 DIAGNOSIS — N185 Chronic kidney disease, stage 5: Secondary | ICD-10-CM | POA: Diagnosis not present

## 2021-04-23 DIAGNOSIS — Z23 Encounter for immunization: Secondary | ICD-10-CM | POA: Diagnosis not present

## 2021-04-23 DIAGNOSIS — N186 End stage renal disease: Secondary | ICD-10-CM | POA: Diagnosis not present

## 2021-04-23 DIAGNOSIS — Z992 Dependence on renal dialysis: Secondary | ICD-10-CM | POA: Diagnosis not present

## 2021-04-24 DIAGNOSIS — R509 Fever, unspecified: Secondary | ICD-10-CM | POA: Diagnosis not present

## 2021-04-25 DIAGNOSIS — E785 Hyperlipidemia, unspecified: Secondary | ICD-10-CM | POA: Diagnosis not present

## 2021-04-25 DIAGNOSIS — N185 Chronic kidney disease, stage 5: Secondary | ICD-10-CM | POA: Diagnosis not present

## 2021-04-25 DIAGNOSIS — I739 Peripheral vascular disease, unspecified: Secondary | ICD-10-CM | POA: Diagnosis not present

## 2021-04-25 DIAGNOSIS — N186 End stage renal disease: Secondary | ICD-10-CM | POA: Diagnosis not present

## 2021-04-25 DIAGNOSIS — I5033 Acute on chronic diastolic (congestive) heart failure: Secondary | ICD-10-CM | POA: Diagnosis not present

## 2021-04-25 DIAGNOSIS — Z23 Encounter for immunization: Secondary | ICD-10-CM | POA: Diagnosis not present

## 2021-04-25 DIAGNOSIS — Z992 Dependence on renal dialysis: Secondary | ICD-10-CM | POA: Diagnosis not present

## 2021-04-25 DIAGNOSIS — R2689 Other abnormalities of gait and mobility: Secondary | ICD-10-CM | POA: Diagnosis not present

## 2021-04-25 DIAGNOSIS — E1169 Type 2 diabetes mellitus with other specified complication: Secondary | ICD-10-CM | POA: Diagnosis not present

## 2021-04-25 DIAGNOSIS — E539 Vitamin B deficiency, unspecified: Secondary | ICD-10-CM | POA: Diagnosis not present

## 2021-04-25 DIAGNOSIS — I509 Heart failure, unspecified: Secondary | ICD-10-CM | POA: Diagnosis not present

## 2021-04-25 DIAGNOSIS — I10 Essential (primary) hypertension: Secondary | ICD-10-CM | POA: Diagnosis not present

## 2021-04-25 DIAGNOSIS — J9601 Acute respiratory failure with hypoxia: Secondary | ICD-10-CM | POA: Diagnosis not present

## 2021-04-25 DIAGNOSIS — J189 Pneumonia, unspecified organism: Secondary | ICD-10-CM | POA: Diagnosis not present

## 2021-04-25 DIAGNOSIS — R279 Unspecified lack of coordination: Secondary | ICD-10-CM | POA: Diagnosis not present

## 2021-04-25 DIAGNOSIS — M6281 Muscle weakness (generalized): Secondary | ICD-10-CM | POA: Diagnosis not present

## 2021-04-25 NOTE — Patient Instructions (Signed)
Danny [name]  04/25/2021     @PREFPERIOPPHARMACY @   Your procedure is scheduled on 04/26/2021.  Report to [name] at 8:45 A.M.  Call this number if you have problems the morning of surgery:  [phone]   Remember:  Do not eat or drink after midnight.  :   Take these medicines the morning of surgery with A SIP OF WATER :     Do not wear jewelry, make-up or nail polish.  Do not wear lotions, powders, or perfumes, or deodorant.  Do not shave 48 hours prior to surgery.  Men may shave face and neck.  Do not bring valuables to the hospital.  [facility] is not responsible for any belongings or valuables.  Contacts, dentures or bridgework may not be worn into surgery.  Leave your suitcase in the car.  After surgery it may be brought to your room.  For patients admitted to the hospital, discharge time will be determined by your treatment team.  Patients discharged the day of surgery will not be allowed to drive home.   Name and phone number of your driver:   Family Special instructions:  N/A  Please read over the following fact sheets that you were given. Care and Recovery After Surgery AV Fistula Placement Arteriovenous (AV) fistula placement is a surgical procedure to create a connection between a blood vessel that carries blood away from the heart (artery) and a blood vessel that returns blood to the heart (vein). This connection is called a fistula. It is often made in the forearm or upper arm. You may need this procedure if you are getting hemodialysis treatments for kidney disease. An AV fistula makes your vein larger and stronger over several months. This makes the vein a safe and easy spot to insert the needles that are used for hemodialysis. Tell a health care provider about: Any allergies you have. All medicines you are taking, including vitamins, herbs, eye drops, creams, and over-the-counter medicines. Any problems you or family members have had with anesthetic  medicines. Any blood disorders you have. Any surgeries you have had. Any medical conditions you have or have had in the past. Whether you are pregnant or may be pregnant. What are the risks? Generally, this is a safe procedure. However, problems may occur, including: Infection. Blood clot. Reduced blood flow (stenosis). Weakening or ballooning out of the fistula (aneurysm). Bleeding. Allergic reactions to medicines. Nerve damage. Swelling near the fistula. Failure of the procedure. What happens before the procedure? Staying hydrated Follow instructions from your health care provider about hydration, which may include: Up to 2 hours before the procedure - you may continue to drink clear liquids, such as water, clear fruit juice, black coffee, and plain tea.  Eating and drinking restrictions Follow instructions from your health care provider about eating and drinking, which may include: 8 hours before the procedure - stop eating heavy meals or foods, such as meat, fried foods, or fatty foods. 6 hours before the procedure - stop eating light meals or foods, such as toast or cereal. 6 hours before the procedure - stop drinking milk or drinks that contain milk. 2 hours before the procedure - stop drinking clear liquids. Medicines Ask your health care provider about: Changing or stopping your regular medicines. This is especially important if you are taking diabetes medicines or blood thinners. Taking medicines such as aspirin and ibuprofen. These medicines can thin your blood. Do not take these medicines unless your health care provider  tells you to take them. Taking over-the-counter medicines, vitamins, herbs, and supplements. General instructions Do not use any products that contain nicotine or tobacco for at least 4 weeks before the procedure. These products include cigarettes, chewing tobacco, and vaping devices, such as e-cigarettes. If you need help quitting, ask your health care  provider. Imaging tests of your arm may be done to find the best place for the fistula. Plan to have a responsible adult take you home from the hospital or clinic. Ask your health care provider: How your surgery site will be marked. What steps will be taken to help prevent infection. These steps may include: Removing hair at the surgery site. Washing skin with a germ-killing soap. Taking antibiotic medicine. What happens during the procedure? An IV will be inserted into one of your veins. You will be given one or more of the following: A medicine to help you relax (sedative). A medicine to numb the area (local anesthetic). A medicine to make you fall asleep (general anesthetic). A medicine that is injected into an area of your body to numb everything below the injection site (regional anesthetic). An incision will be made on the inner side of your arm. A vein and an artery will be opened and connected with stitches (sutures). The incision will be closed with sutures or clips. A bandage (dressing) will be placed over the area. The procedure may vary among health care providers and hospitals. What happens after the procedure? Your blood pressure, heart rate, breathing rate, and blood oxygen level may be monitored until you leave the hospital or clinic. Your fistula site will be checked for bleeding or swelling. You will be given pain medicine as needed. If you were given a sedative during the procedure, it can affect you for several hours. Do not drive or operate machinery until your health care provider says that it is safe. Summary Arteriovenous (AV) fistula placement is a surgical procedure to create a connection between a blood vessel that carries blood away from your heart (artery) and a blood vessel that returns blood to your heart (vein). This connection is called a fistula. Follow instructions from your health care provider about eating and drinking before the procedure. Ask your  health care provider about changing or stopping your regular medicines before the procedure. This is especially important if you are taking diabetes medicines or blood thinners. Plan to have a responsible adult take you home from the hospital or clinic. This information is not intended to replace advice given to you by your health care provider. Make sure you discuss any questions you have with your health care provider. Document Revised: 01/21/2020 Document Reviewed: 01/21/2020 Elsevier Patient Education  [place].

## 2021-04-26 ENCOUNTER — Encounter (INDEPENDENT_AMBULATORY_CARE_PROVIDER_SITE_OTHER): Payer: Self-pay

## 2021-04-26 ENCOUNTER — Ambulatory Visit (HOSPITAL_COMMUNITY): Payer: Medicare HMO | Admitting: Anesthesiology

## 2021-04-26 ENCOUNTER — Telehealth (INDEPENDENT_AMBULATORY_CARE_PROVIDER_SITE_OTHER): Payer: Self-pay

## 2021-04-26 ENCOUNTER — Other Ambulatory Visit (INDEPENDENT_AMBULATORY_CARE_PROVIDER_SITE_OTHER): Payer: Self-pay

## 2021-04-26 ENCOUNTER — Encounter (HOSPITAL_COMMUNITY): Payer: Self-pay | Admitting: Vascular Surgery

## 2021-04-26 ENCOUNTER — Ambulatory Visit (HOSPITAL_COMMUNITY)
Admission: RE | Admit: 2021-04-26 | Discharge: 2021-04-26 | Disposition: A | Discharge status: Skilled Nursing Facility | Payer: Medicare HMO | Source: Ambulatory Visit | Attending: Vascular Surgery | Admitting: Vascular Surgery

## 2021-04-26 ENCOUNTER — Encounter (HOSPITAL_COMMUNITY)
Admission: RE | Disposition: A | Discharge status: Skilled Nursing Facility | Payer: Self-pay | Source: Ambulatory Visit | Attending: Vascular Surgery

## 2021-04-26 DIAGNOSIS — D509 Iron deficiency anemia, unspecified: Secondary | ICD-10-CM

## 2021-04-26 DIAGNOSIS — Z992 Dependence on renal dialysis: Secondary | ICD-10-CM | POA: Insufficient documentation

## 2021-04-26 DIAGNOSIS — E1122 Type 2 diabetes mellitus with diabetic chronic kidney disease: Secondary | ICD-10-CM | POA: Insufficient documentation

## 2021-04-26 DIAGNOSIS — Z79899 Other long term (current) drug therapy: Secondary | ICD-10-CM | POA: Insufficient documentation

## 2021-04-26 DIAGNOSIS — Z7901 Long term (current) use of anticoagulants: Secondary | ICD-10-CM | POA: Insufficient documentation

## 2021-04-26 DIAGNOSIS — Z888 Allergy status to other drugs, medicaments and biological substances status: Secondary | ICD-10-CM | POA: Insufficient documentation

## 2021-04-26 DIAGNOSIS — Z794 Long term (current) use of insulin: Secondary | ICD-10-CM | POA: Insufficient documentation

## 2021-04-26 DIAGNOSIS — I132 Hypertensive heart and chronic kidney disease with heart failure and with stage 5 chronic kidney disease, or end stage renal disease: Secondary | ICD-10-CM | POA: Diagnosis not present

## 2021-04-26 DIAGNOSIS — I5030 Unspecified diastolic (congestive) heart failure: Secondary | ICD-10-CM | POA: Diagnosis not present

## 2021-04-26 DIAGNOSIS — N186 End stage renal disease: Secondary | ICD-10-CM | POA: Diagnosis not present

## 2021-04-26 HISTORY — PX: AV FISTULA PLACEMENT: SHX1204

## 2021-04-26 LAB — POCT I-STAT, CHEM 8
BUN: 42 mg/dL — ABNORMAL HIGH (ref 8–23)
Calcium, Ion: 1.01 mmol/L — ABNORMAL LOW (ref 1.15–1.40)
Chloride: 100 mmol/L (ref 98–111)
Creatinine, Ser: 2.7 mg/dL — ABNORMAL HIGH (ref 0.61–1.24)
Glucose, Bld: 164 mg/dL — ABNORMAL HIGH (ref 70–99)
HCT: 29 % — ABNORMAL LOW (ref 39.0–52.0)
Hemoglobin: 9.9 g/dL — ABNORMAL LOW (ref 13.0–17.0)
Potassium: 4.5 mmol/L (ref 3.5–5.1)
Sodium: 136 mmol/L (ref 135–145)
TCO2: 30 mmol/L (ref 22–32)

## 2021-04-26 LAB — GLUCOSE, CAPILLARY: Glucose-Capillary: 154 mg/dL — ABNORMAL HIGH (ref 70–99)

## 2021-04-26 SURGERY — INSERTION OF ARTERIOVENOUS (AV) GORE-TEX GRAFT ARM
Anesthesia: General | Site: Arm Lower | Laterality: Left

## 2021-04-26 MED ORDER — SODIUM CHLORIDE 0.9 % IV SOLN
INTRAVENOUS | Status: DC
  Administered 2021-04-26: 1000 mL via INTRAVENOUS

## 2021-04-26 MED ORDER — ESMOLOL HCL 100 MG/10ML IV SOLN
INTRAVENOUS | Status: DC | PRN
  Administered 2021-04-26 (×4): 10 mg via INTRAVENOUS

## 2021-04-26 MED ORDER — PEG 3350-KCL-NA BICARB-NACL 420 G PO SOLR
4000.0000 mL | ORAL | 0 refills | Status: DC
Start: 2021-04-26 — End: 2021-08-16

## 2021-04-26 MED ORDER — VANCOMYCIN HCL 1500 MG/300ML IV SOLN
1500.0000 mg | INTRAVENOUS | Status: AC
  Administered 2021-04-26: 1500 mg via INTRAVENOUS
  Filled 2021-04-26: qty 300

## 2021-04-26 MED ORDER — VANCOMYCIN HCL IN DEXTROSE 1-5 GM/200ML-% IV SOLN
INTRAVENOUS | Status: AC
  Filled 2021-04-26: qty 200

## 2021-04-26 MED ORDER — CHLORHEXIDINE GLUCONATE 4 % EX LIQD: 60.0000 mL | Freq: Once | CUTANEOUS | Status: DC

## 2021-04-26 MED ORDER — FENTANYL CITRATE (PF) 100 MCG/2ML IJ SOLN
INTRAMUSCULAR | Status: DC | PRN
  Administered 2021-04-26: 25 ug via INTRAVENOUS

## 2021-04-26 MED ORDER — ESMOLOL HCL 100 MG/10ML IV SOLN
INTRAVENOUS | Status: AC
  Filled 2021-04-26: qty 10

## 2021-04-26 MED ORDER — HEPARIN 6000 UNIT IRRIGATION SOLUTION
Status: DC | PRN
  Administered 2021-04-26: 1

## 2021-04-26 MED ORDER — PROPOFOL 500 MG/50ML IV EMUL
INTRAVENOUS | Status: DC | PRN
  Administered 2021-04-26: 50 ug/kg/min via INTRAVENOUS

## 2021-04-26 MED ORDER — ORAL CARE MOUTH RINSE: 15.0000 mL | Freq: Once | OROMUCOSAL | Status: AC

## 2021-04-26 MED ORDER — PHENYLEPHRINE 40 MCG/ML (10ML) SYRINGE FOR IV PUSH (FOR BLOOD PRESSURE SUPPORT)
PREFILLED_SYRINGE | INTRAVENOUS | Status: AC
  Filled 2021-04-26: qty 10

## 2021-04-26 MED ORDER — FENTANYL CITRATE (PF) 100 MCG/2ML IJ SOLN
INTRAMUSCULAR | Status: AC
  Filled 2021-04-26: qty 2

## 2021-04-26 MED ORDER — EPHEDRINE 5 MG/ML INJ
INTRAVENOUS | Status: AC
  Filled 2021-04-26: qty 5

## 2021-04-26 MED ORDER — LACTATED RINGERS IV SOLN: INTRAVENOUS | Status: DC

## 2021-04-26 MED ORDER — LIDOCAINE-EPINEPHRINE 0.5 %-1:200000 IJ SOLN
INTRAMUSCULAR | Status: DC | PRN
  Administered 2021-04-26: 10 mL

## 2021-04-26 MED ORDER — PROPOFOL 10 MG/ML IV BOLUS
INTRAVENOUS | Status: AC
  Filled 2021-04-26: qty 40

## 2021-04-26 MED ORDER — MIDAZOLAM HCL 2 MG/2ML IJ SOLN
INTRAMUSCULAR | Status: AC
  Filled 2021-04-26: qty 2

## 2021-04-26 MED ORDER — CHLORHEXIDINE GLUCONATE 0.12 % MT SOLN
15.0000 mL | Freq: Once | OROMUCOSAL | Status: AC
  Administered 2021-04-26: 15 mL via OROMUCOSAL

## 2021-04-26 MED ORDER — 0.9 % SODIUM CHLORIDE (POUR BTL) OPTIME
TOPICAL | Status: DC | PRN
  Administered 2021-04-26: 500 mL

## 2021-04-26 MED ORDER — MIDAZOLAM HCL 5 MG/5ML IJ SOLN
INTRAMUSCULAR | Status: DC | PRN
  Administered 2021-04-26: 1 mg via INTRAVENOUS

## 2021-04-26 MED ORDER — HEPARIN SODIUM (PORCINE) 1000 UNIT/ML IJ SOLN
INTRAMUSCULAR | Status: AC
  Filled 2021-04-26: qty 6

## 2021-04-26 MED ORDER — LIDOCAINE-EPINEPHRINE 0.5 %-1:200000 IJ SOLN
INTRAMUSCULAR | Status: AC
  Filled 2021-04-26: qty 1

## 2021-04-26 SURGICAL SUPPLY — 37 items
ARMBAND PINK RESTRICT EXTREMIT (MISCELLANEOUS) ×2 IMPLANT
BAG HAMPER (MISCELLANEOUS) ×2 IMPLANT
CANNULA VESSEL 3MM 2 BLNT TIP (CANNULA) ×2 IMPLANT
CLIP LIGATING EXTRA MED SLVR (CLIP) ×2 IMPLANT
CLIP LIGATING EXTRA SM BLUE (MISCELLANEOUS) ×2 IMPLANT
COVER LIGHT HANDLE STERIS (MISCELLANEOUS) ×4 IMPLANT
COVER MAYO STAND XLG (MISCELLANEOUS) ×2 IMPLANT
DECANTER SPIKE VIAL GLASS SM (MISCELLANEOUS) ×2 IMPLANT
DERMABOND ADVANCED (GAUZE/BANDAGES/DRESSINGS) ×1
DERMABOND ADVANCED .7 DNX12 (GAUZE/BANDAGES/DRESSINGS) ×1 IMPLANT
ELECT REM PT RETURN 9FT ADLT (ELECTROSURGICAL) ×2
ELECTRODE REM PT RTRN 9FT ADLT (ELECTROSURGICAL) ×1 IMPLANT
GAUZE SPONGE 4X4 12PLY STRL (GAUZE/BANDAGES/DRESSINGS) ×4 IMPLANT
GLOVE SURG MICRO LTX SZ7.5 (GLOVE) ×2 IMPLANT
GLOVE SURG UNDER POLY LF SZ7 (GLOVE) ×8 IMPLANT
GOWN STRL REUS W/TWL LRG LVL3 (GOWN DISPOSABLE) ×8 IMPLANT
IV NS 500ML (IV SOLUTION) ×2
IV NS 500ML BAXH (IV SOLUTION) ×2 IMPLANT
KIT BLADEGUARD II DBL (SET/KITS/TRAYS/PACK) ×2 IMPLANT
KIT TURNOVER KIT A (KITS) ×2 IMPLANT
MANIFOLD NEPTUNE II (INSTRUMENTS) ×2 IMPLANT
MARKER SKIN DUAL TIP RULER LAB (MISCELLANEOUS) ×4 IMPLANT
NEEDLE HYPO 18GX1.5 BLUNT FILL (NEEDLE) ×2 IMPLANT
NS IRRIG 1000ML POUR BTL (IV SOLUTION) ×2 IMPLANT
PACK CV ACCESS (CUSTOM PROCEDURE TRAY) ×2 IMPLANT
PAD ARMBOARD 7.5X6 YLW CONV (MISCELLANEOUS) ×2 IMPLANT
SET BASIN LINEN APH (SET/KITS/TRAYS/PACK) ×2 IMPLANT
SOL PREP POV-IOD 4OZ 10% (MISCELLANEOUS) ×2 IMPLANT
SOL PREP PROV IODINE SCRUB 4OZ (MISCELLANEOUS) ×2 IMPLANT
SPONGE T-LAP 18X18 ~~LOC~~+RFID (SPONGE) ×2 IMPLANT
SUT PROLENE 6 0 CC (SUTURE) ×2 IMPLANT
SUT SILK 2 0 PERMA HAND 18 BK (SUTURE) ×2 IMPLANT
SUT VIC AB 3-0 SH 27 (SUTURE) ×1
SUT VIC AB 3-0 SH 27X BRD (SUTURE) ×1 IMPLANT
SYR 10ML LL (SYRINGE) ×2 IMPLANT
SYR CONTROL 10ML LL (SYRINGE) ×2 IMPLANT
UNDERPAD 30X36 HEAVY ABSORB (UNDERPADS AND DIAPERS) ×2 IMPLANT

## 2021-04-26 NOTE — Discharge Instructions (Signed)
[facility]  Discharge Instructions  AV Fistula or Graft Surgery for Dialysis Access  Please refer to the following instructions for your post-procedure care. Your surgeon or physician assistant will discuss any changes with you.  Activity  You may drive the day following your surgery, if you are comfortable and no longer taking prescription pain medication. Resume full activity as the soreness in your incision resolves.  Bathing/Showering  You may shower after you go home. Keep your incision dry for 48 hours. Do not soak in a bathtub, hot tub, or swim until the incision heals completely. You may not shower if you have a hemodialysis catheter.  Incision Care  Clean your incision with mild soap and water after 48 hours. Pat the area dry with a clean towel. You do not need a bandage unless otherwise instructed. Do not apply any ointments or creams to your incision. You may have skin glue on your incision. Do not peel it off. It will come off on its own in about one week. Your arm may swell a bit after surgery. To reduce swelling use pillows to elevate your arm so it is above your heart. Your doctor will tell you if you need to lightly wrap your arm with an ACE bandage.  Diet  Resume your normal diet. There are not special food restrictions following this procedure. In order to heal from your surgery, it is CRITICAL to get adequate nutrition. Your body requires vitamins, minerals, and protein. Vegetables are the best source of vitamins and minerals. Vegetables also provide the perfect balance of protein. Processed food has little nutritional value, so try to avoid this.  Medications  Resume taking all of your medications. If your incision is causing pain, you may take over-the counter pain relievers such as acetaminophen (Tylenol). If you were prescribed a stronger pain medication, please be aware these medications can cause nausea and constipation. Prevent  nausea by taking the medication with a snack or meal. Avoid constipation by drinking plenty of fluids and eating foods with high amount of fiber, such as fruits, vegetables, and grains.  Do not take Tylenol if you are taking prescription pain medications.  Follow up Your surgeon may want to see you in the office following your access surgery. If so, this will be arranged at the time of your surgery.  Please call us immediately for any of the following conditions:  Increased pain, redness, drainage (pus) from your incision site Fever of 101 degrees or higher Severe or worsening pain at your incision site Hand pain or numbness.  Reduce your risk of vascular disease:  Stop smoking. If you would like help, call [facility] at 1-800-QUIT-NOW (385) 362-0163) or [place] at [place] your cholesterol Maintain a desired weight Control your diabetes Keep your blood pressure down  Dialysis  It will take several weeks to several months for your new dialysis access to be ready for use. Your surgeon will determine when it is okay to use it. Your nephrologist will continue to direct your dialysis. You can continue to use your Permcath until your new access is ready for use.   04/26/2021 Danny [name] [id number] 01-Jul-1955  Surgeon(s): Ardian Haberland, Arvilla Meres, MD  Procedure(s): LEFT ARM ARTERIOVENOUS (AV) FISTULA CREATION (BRACHIOCEPHALIC)   May stick graft immediately   May stick graft on designated area only:    Do not stick fistula for 12 weeks    If you have any questions, please call the office at  [phone].  

## 2021-04-26 NOTE — Anesthesia Preprocedure Evaluation (Signed)
Anesthesia Evaluation  Patient identified by MRN, date of birth, ID band Patient awake    Reviewed: Allergy & Precautions, H&P , NPO status , Patient's Chart, lab work & pertinent test results, reviewed documented beta blocker date and time   Airway Mallampati: II  TM Distance: >3 FB Neck ROM: full    Dental no notable dental hx.    Pulmonary neg pulmonary ROS, former smoker,    Pulmonary exam normal breath sounds clear to auscultation       Cardiovascular Exercise Tolerance: Good hypertension, +CHF   Rhythm:regular Rate:Normal     Neuro/Psych negative neurological ROS  negative psych ROS   GI/Hepatic negative GI ROS, Neg liver ROS,   Endo/Other  negative endocrine ROSdiabetes  Renal/GU CRF and ESRFRenal disease  negative genitourinary   Musculoskeletal   Abdominal   Peds  Hematology  (+) Blood dyscrasia, anemia ,   Anesthesia Other Findings   Reproductive/Obstetrics negative OB ROS                             Anesthesia Physical Anesthesia Plan  ASA: 3  Anesthesia Plan: General   Post-op Pain Management:    Induction:   PONV Risk Score and Plan: Propofol infusion  Airway Management Planned:   Additional Equipment:   Intra-op Plan:   Post-operative Plan:   Informed Consent: I have reviewed the patients History and Physical, chart, labs and discussed the procedure including the risks, benefits and alternatives for the proposed anesthesia with the patient or authorized representative who has indicated his/her understanding and acceptance.     Dental Advisory Given  Plan Discussed with: CRNA  Anesthesia Plan Comments:         Anesthesia Quick Evaluation

## 2021-04-26 NOTE — Transfer of Care (Signed)
Immediate Anesthesia Transfer of Care Note  Patient: Danny [name]  Procedure(s) Performed: LEFT ARM ARTERIOVENOUS (AV) FISTULA CREATION (BRACHIOCEPHALIC) (Left: Arm Lower)  Patient Location: PACU  Anesthesia Type:General  Level of Consciousness: awake, alert  and patient cooperative  Airway & Oxygen Therapy: Patient Spontanous Breathing and Patient connected to nasal cannula oxygen  Post-op Assessment: Report given to RN, Post -op Vital signs reviewed and stable and Patient moving all extremities X 4  Post vital signs: Reviewed and stable  Last Vitals:  Vitals Value Taken Time  BP 138/74 04/26/21 1053  Temp    Pulse 57 04/26/21 1058  Resp 14 04/26/21 1058  SpO2 100 % 04/26/21 1058  Vitals shown include unvalidated device data.  Last Pain:  Vitals:   04/26/21 0855  PainSc: (P) 0-No pain      Patients Stated Pain Goal: (P) 7 (44/62/86 [id number])  Complications: No notable events documented.

## 2021-04-26 NOTE — Anesthesia Postprocedure Evaluation (Signed)
Anesthesia Post Note  Patient: Danny [name]  Procedure(s) Performed: LEFT ARM ARTERIOVENOUS (AV) FISTULA CREATION (BRACHIOCEPHALIC) (Left: Arm Lower)  Patient location during evaluation: Phase II Anesthesia Type: General Level of consciousness: awake Pain management: pain level controlled Vital Signs Assessment: post-procedure vital signs reviewed and stable Respiratory status: spontaneous breathing and respiratory function stable Cardiovascular status: blood pressure returned to baseline and stable Postop Assessment: no headache and no apparent nausea or vomiting Anesthetic complications: no Comments: Late entry   No notable events documented.   Last Vitals:  Vitals:   04/26/21 1115 04/26/21 1152  BP: (!) 149/83 136/74  Pulse: (!) 58 (!) 56  Resp: 15 18  Temp:  36.5 C  SpO2: 97% 98%    Last Pain:  Vitals:   04/26/21 1152  TempSrc: Oral  PainSc: 0-No pain                 Louann Sjogren

## 2021-04-26 NOTE — Progress Notes (Signed)
Transport service called, Engineer, mining with patient and transported via [facility] to care facility. Report called to nurse at  [facility] facility

## 2021-04-26 NOTE — Op Note (Signed)
    OPERATIVE REPORT  DATE OF SURGERY: 04/26/2021  PATIENT: [name], 65 y.o. male MRN: [id number]  DOB: [birth year]  PRE-OPERATIVE DIAGNOSIS: End-stage renal disease  POST-OPERATIVE DIAGNOSIS:  Same  PROCEDURE: Left brachiocephalic AV fistula creation  SURGEON:  Curt Jews, M.D.  PHYSICIAN ASSISTANT: Vevelyn Royals, RN  The assistant was needed for exposure and to expedite the case  ANESTHESIA: Local with sedation  EBL: per anesthesia record  Total I/O In: 500 [I.V.:200; IV Piggyback:300] Out: -   BLOOD ADMINISTERED: none  DRAINS: none  SPECIMEN: none  COUNTS CORRECT:  YES  PATIENT DISPOSITION:  PACU - hemodynamically stable  PROCEDURE DETAILS: The patient was taken the operating placed supine position where the area of the left arm was prepped and draped in usual sterile fashion.  SonoSite ultrasound was used to visualize the veins in the arm.  The patient had a large antecubital vein with multiple branches and small to moderate forearm but excellent upper arm cephalic vein.  Using local anesthesia, incision was made over the antecubital space and carried down to isolate the cephalic vein.  Tributary branches were ligated and divided.  The vein was ligated distally and divided.  The brachial artery was exposed through the same incision and was of good caliber.  The artery was occluded proximally and distally and was opened with an 11 blade and sent longitudinally with Potts scissors.  A small arteriotomy was created to reduce risk of steal.  The vein was cut to the appropriate length and was spatulated and sewn end-to-side to the artery with a running 6-0 Prolene suture.  Clamps removed and excellent thrill was noted.  The wounds were irrigated with saline.  Hemostasis was obtained with electrocautery.  The wounds were closed with 3-0 Vicryl in the subcutaneous and subcuticular tissue.  Sterile dressing was applied and the patient was transferred to the recovery room in stable  condition   Danny [name], M.D., [facility] 04/26/2021 10:53 AM  Note: Portions of this report may have been transcribed using voice recognition software.  Every effort has been made to ensure accuracy; however, inadvertent computerized transcription errors may still be present.

## 2021-04-26 NOTE — Telephone Encounter (Signed)
Danny [name], CMA  

## 2021-04-26 NOTE — Interval H&P Note (Signed)
History and Physical Interval Note:  04/26/2021 9:14 AM  Danny [name]  has presented today for surgery, with the diagnosis of ESRD.  The various methods of treatment have been discussed with the patient and family. After consideration of risks, benefits and other options for treatment, the patient has consented to  Procedure(s): INSERTION OF LEFT ARM ARTERIOVENOUS (AV) GORE-TEX GRAFT VERSUS FISTULA CREATION (Left) as a surgical intervention.  The patient's history has been reviewed, patient examined, no change in status, stable for surgery.  I have reviewed the patient's chart and labs.  Questions were answered to the patient's satisfaction.     Curt Jews

## 2021-04-27 ENCOUNTER — Encounter (HOSPITAL_COMMUNITY): Payer: Self-pay | Admitting: Vascular Surgery

## 2021-04-27 DIAGNOSIS — L89513 Pressure ulcer of right ankle, stage 3: Secondary | ICD-10-CM | POA: Diagnosis not present

## 2021-04-27 DIAGNOSIS — N184 Chronic kidney disease, stage 4 (severe): Secondary | ICD-10-CM | POA: Diagnosis not present

## 2021-04-27 DIAGNOSIS — M6281 Muscle weakness (generalized): Secondary | ICD-10-CM | POA: Diagnosis not present

## 2021-04-27 DIAGNOSIS — E669 Obesity, unspecified: Secondary | ICD-10-CM | POA: Diagnosis not present

## 2021-04-28 DIAGNOSIS — Z992 Dependence on renal dialysis: Secondary | ICD-10-CM | POA: Diagnosis not present

## 2021-04-28 DIAGNOSIS — Z23 Encounter for immunization: Secondary | ICD-10-CM | POA: Diagnosis not present

## 2021-04-28 DIAGNOSIS — L89523 Pressure ulcer of left ankle, stage 3: Secondary | ICD-10-CM | POA: Diagnosis not present

## 2021-04-28 DIAGNOSIS — L89153 Pressure ulcer of sacral region, stage 3: Secondary | ICD-10-CM | POA: Diagnosis not present

## 2021-04-28 DIAGNOSIS — L98492 Non-pressure chronic ulcer of skin of other sites with fat layer exposed: Secondary | ICD-10-CM | POA: Diagnosis not present

## 2021-04-28 DIAGNOSIS — N186 End stage renal disease: Secondary | ICD-10-CM | POA: Diagnosis not present

## 2021-04-28 DIAGNOSIS — L89513 Pressure ulcer of right ankle, stage 3: Secondary | ICD-10-CM | POA: Diagnosis not present

## 2021-04-30 DIAGNOSIS — Z23 Encounter for immunization: Secondary | ICD-10-CM | POA: Diagnosis not present

## 2021-04-30 DIAGNOSIS — Z992 Dependence on renal dialysis: Secondary | ICD-10-CM | POA: Diagnosis not present

## 2021-04-30 DIAGNOSIS — N186 End stage renal disease: Secondary | ICD-10-CM | POA: Diagnosis not present

## 2021-05-02 DIAGNOSIS — I509 Heart failure, unspecified: Secondary | ICD-10-CM | POA: Diagnosis not present

## 2021-05-02 DIAGNOSIS — R5381 Other malaise: Secondary | ICD-10-CM | POA: Diagnosis not present

## 2021-05-02 DIAGNOSIS — E1169 Type 2 diabetes mellitus with other specified complication: Secondary | ICD-10-CM | POA: Diagnosis not present

## 2021-05-02 DIAGNOSIS — R52 Pain, unspecified: Secondary | ICD-10-CM | POA: Diagnosis not present

## 2021-05-02 DIAGNOSIS — M19071 Primary osteoarthritis, right ankle and foot: Secondary | ICD-10-CM | POA: Diagnosis not present

## 2021-05-02 DIAGNOSIS — E559 Vitamin D deficiency, unspecified: Secondary | ICD-10-CM | POA: Diagnosis not present

## 2021-05-02 DIAGNOSIS — J129 Viral pneumonia, unspecified: Secondary | ICD-10-CM | POA: Diagnosis not present

## 2021-05-02 DIAGNOSIS — E785 Hyperlipidemia, unspecified: Secondary | ICD-10-CM | POA: Diagnosis not present

## 2021-05-02 DIAGNOSIS — Z79899 Other long term (current) drug therapy: Secondary | ICD-10-CM | POA: Diagnosis not present

## 2021-05-04 ENCOUNTER — Emergency Department (HOSPITAL_COMMUNITY): Payer: Medicare HMO

## 2021-05-04 ENCOUNTER — Other Ambulatory Visit: Payer: Self-pay

## 2021-05-04 ENCOUNTER — Emergency Department (HOSPITAL_COMMUNITY)
Admission: EM | Admit: 2021-05-04 | Discharge: 2021-05-04 | Disposition: A | Discharge status: Home / Self Care | Payer: Medicare HMO | Attending: Emergency Medicine | Admitting: Emergency Medicine

## 2021-05-04 ENCOUNTER — Encounter (HOSPITAL_COMMUNITY): Payer: Self-pay

## 2021-05-04 DIAGNOSIS — I959 Hypotension, unspecified: Secondary | ICD-10-CM | POA: Diagnosis not present

## 2021-05-04 DIAGNOSIS — Z794 Long term (current) use of insulin: Secondary | ICD-10-CM | POA: Diagnosis not present

## 2021-05-04 DIAGNOSIS — I82612 Acute embolism and thrombosis of superficial veins of left upper extremity: Secondary | ICD-10-CM | POA: Diagnosis not present

## 2021-05-04 DIAGNOSIS — I5032 Chronic diastolic (congestive) heart failure: Secondary | ICD-10-CM | POA: Insufficient documentation

## 2021-05-04 DIAGNOSIS — R609 Edema, unspecified: Secondary | ICD-10-CM | POA: Diagnosis not present

## 2021-05-04 DIAGNOSIS — R279 Unspecified lack of coordination: Secondary | ICD-10-CM | POA: Diagnosis not present

## 2021-05-04 DIAGNOSIS — E1122 Type 2 diabetes mellitus with diabetic chronic kidney disease: Secondary | ICD-10-CM | POA: Insufficient documentation

## 2021-05-04 DIAGNOSIS — Z743 Need for continuous supervision: Secondary | ICD-10-CM | POA: Diagnosis not present

## 2021-05-04 DIAGNOSIS — M6281 Muscle weakness (generalized): Secondary | ICD-10-CM | POA: Diagnosis not present

## 2021-05-04 DIAGNOSIS — N186 End stage renal disease: Secondary | ICD-10-CM | POA: Diagnosis not present

## 2021-05-04 DIAGNOSIS — Z87891 Personal history of nicotine dependence: Secondary | ICD-10-CM | POA: Diagnosis not present

## 2021-05-04 DIAGNOSIS — I132 Hypertensive heart and chronic kidney disease with heart failure and with stage 5 chronic kidney disease, or end stage renal disease: Secondary | ICD-10-CM | POA: Insufficient documentation

## 2021-05-04 DIAGNOSIS — L89513 Pressure ulcer of right ankle, stage 3: Secondary | ICD-10-CM | POA: Diagnosis not present

## 2021-05-04 DIAGNOSIS — E669 Obesity, unspecified: Secondary | ICD-10-CM | POA: Diagnosis not present

## 2021-05-04 DIAGNOSIS — Z79899 Other long term (current) drug therapy: Secondary | ICD-10-CM | POA: Insufficient documentation

## 2021-05-04 DIAGNOSIS — Z7901 Long term (current) use of anticoagulants: Secondary | ICD-10-CM | POA: Diagnosis not present

## 2021-05-04 DIAGNOSIS — N184 Chronic kidney disease, stage 4 (severe): Secondary | ICD-10-CM | POA: Diagnosis not present

## 2021-05-04 DIAGNOSIS — M7989 Other specified soft tissue disorders: Secondary | ICD-10-CM | POA: Diagnosis not present

## 2021-05-04 DIAGNOSIS — Z7401 Bed confinement status: Secondary | ICD-10-CM | POA: Diagnosis not present

## 2021-05-04 DIAGNOSIS — R5381 Other malaise: Secondary | ICD-10-CM | POA: Diagnosis not present

## 2021-05-04 LAB — BASIC METABOLIC PANEL
Anion gap: 13 (ref 5–15)
BUN: 66 mg/dL — ABNORMAL HIGH (ref 8–23)
CO2: 24 mmol/L (ref 22–32)
Calcium: 8.6 mg/dL — ABNORMAL LOW (ref 8.9–10.3)
Chloride: 96 mmol/L — ABNORMAL LOW (ref 98–111)
Creatinine, Ser: 4.61 mg/dL — ABNORMAL HIGH (ref 0.61–1.24)
GFR, Estimated: 13 mL/min — ABNORMAL LOW (ref >60)
Glucose, Bld: 152 mg/dL — ABNORMAL HIGH (ref 70–99)
Potassium: 4 mmol/L (ref 3.5–5.1)
Sodium: 133 mmol/L — ABNORMAL LOW (ref 135–145)

## 2021-05-04 LAB — CBC WITH DIFFERENTIAL/PLATELET
Abs Immature Granulocytes: 0.06 10*3/uL (ref 0.00–0.07)
Basophils Absolute: 0.1 10*3/uL (ref 0.0–0.1)
Basophils Relative: 1 %
Eosinophils Absolute: 0.3 10*3/uL (ref 0.0–0.5)
Eosinophils Relative: 3 %
HCT: 28.2 % — ABNORMAL LOW (ref 39.0–52.0)
Hemoglobin: 9.5 g/dL — ABNORMAL LOW (ref 13.0–17.0)
Immature Granulocytes: 1 %
Lymphocytes Relative: 11 %
Lymphs Abs: 1.1 10*3/uL (ref 0.7–4.0)
MCH: 28.6 pg (ref 26.0–34.0)
MCHC: 33.7 g/dL (ref 30.0–36.0)
MCV: 84.9 fL (ref 80.0–100.0)
Monocytes Absolute: 0.5 10*3/uL (ref 0.1–1.0)
Monocytes Relative: 5 %
Neutro Abs: 7.8 10*3/uL — ABNORMAL HIGH (ref 1.7–7.7)
Neutrophils Relative %: 79 %
Platelets: 222 10*3/uL (ref 150–400)
RBC: 3.32 MIL/uL — ABNORMAL LOW (ref 4.22–5.81)
RDW: 18.6 % — ABNORMAL HIGH (ref 11.5–15.5)
WBC: 9.8 10*3/uL (ref 4.0–10.5)
nRBC: 0 % (ref 0.0–0.2)

## 2021-05-04 NOTE — ED Provider Notes (Signed)
[facility] EMERGENCY DEPARTMENT Provider Note   CSN: [id number] Arrival date & time: 05/04/21  4818     History Chief Complaint  Patient presents with   Arm Swelling    Danny [name] is a 65 y.o. male.  HPI      Danny [name] is a 65 y.o. male with past medical history of stage V CKD, type 2 diabetes, CHF, hypertension and anemia who currently resides at [facility] and is 8 days status post placement of AV fistula of left arm presents to the Emergency Department for evaluation of pain and swelling to his elbow and left arm.  Fistula placed for access for hemodialysis.  He currently has a right IJ dialysis catheter.  He has dialysis Tuesday, Thursday Saturday.  He notes swelling of his arm since his recent procedure.  Swelling has progressed to mid upper arm and into his hand.  He endorses having numbness and tingling sensation of his fingers of the left hand and some bruising at the antecubital fossa.  He is anticoagulated with Eliquis.  He denies any chest or neck pain, shortness of breath, fever, chills.  Last dialysis treatment was Saturday.   Past Medical History:  Diagnosis Date   Anemia    Arthritis    CKD (chronic kidney disease), stage V ([place])    Diabetes mellitus without complication ([place])    Diastolic congestive heart failure ([place])    Foot ulcer ([place])    Gout    Hypertension    Urinary retention     Patient Active Problem List   Diagnosis Date Noted   Iron deficiency anemia 04/21/2021   Goals of care, counseling/discussion 56/31/4970   Acute metabolic encephalopathy 26/37/8588   End stage renal disease (HCC)    Symptomatic anemia 01/12/2021   B12 deficiency 12/06/2020   Osteomyelitis of right ankle (HCC)    Cellulitis, leg 10/28/2020   CKD (chronic kidney disease) stage 4, GFR 15-29 ml/min (HCC) 10/28/2020   Moderate protein-calorie malnutrition ([place]) 10/26/2020   Medical non-compliance 10/26/2020   Chronic ulcer of right ankle (HCC)    Venous stasis     Cellulitis 10/25/2020   Normocytic anemia 09/13/2020   Class 2 obesity    Respiratory failure ([place]) 03/23/2020   UTI (urinary tract infection), bacterial 01/22/2020   Acute on chronic anemia 01/16/2020   Benign prostatic hyperplasia with urinary obstruction 01/15/2020   AKI (acute kidney injury) ([place]) 07/08/2019   Hypoxia 07/08/2019   Pressure injury of skin 07/08/2019   Edema of both lower extremities    Anasarca 03/23/2019   Bladder outlet obstruction 03/23/2019   Yeast infection of the skin 03/16/2019   Diabetic ulcer of left foot ([place]) 03/15/2019   Diabetic ulcer of ankle ([place]) 03/15/2019   Hypertension associated with stage 3 chronic kidney disease due to type 2 diabetes mellitus ([place]) 03/09/2019   Controlled type 2 diabetes mellitus with stage 3 chronic kidney disease, with long-term current use of insulin ([place]) 03/09/2019   Type 2 diabetes with nephropathy ([place]) 03/09/2019   Chronic gout due to renal impairment without tophus 03/09/2019   Emphysematous cystitis 03/05/2019   Bilateral hydronephrosis    Bilateral cellulitis of lower leg 03/03/2019   Urinary retention 03/03/2019   Klebsiella Cystitis 03/03/2019   UTI due to Klebsiella species 03/03/2019   Plantar ulcer of left foot ([place]) 03/03/2019   Chronic diastolic CHF (congestive heart failure) ([place]) 02/25/2019   Acute respiratory failure with hypoxia ([place]) 02/25/2019   Acute renal failure  superimposed on stage 4 chronic kidney disease ([place]) 02/24/2019   Congestive heart failure ([place]) 02/24/2019   Dyspnea 02/23/2019   Essential hypertension 02/23/2019   Diabetes mellitus ([place]) 02/23/2019   CKD (chronic kidney disease) 02/23/2019   Psoriasis 02/23/2019    Past Surgical History:  Procedure Laterality Date   ANKLE SURGERY Right    AV FISTULA PLACEMENT Left 04/26/2021   Procedure: LEFT ARM ARTERIOVENOUS (AV) FISTULA CREATION (BRACHIOCEPHALIC);  Surgeon: Rosetta Posner, MD;  Location: [facility];  Service: Vascular;   Laterality: Left;   CHOLECYSTECTOMY     FOOT SURGERY Right    INSERTION OF DIALYSIS CATHETER Right 01/14/2021   Procedure: INSERTION OF TUNNELED DIALYSIS CATHETER RIGHT INTERNAL JUGULAR;  Surgeon: Virl Cagey, MD;  Location: [facility];  Service: General;  Laterality: Right;   TRANSURETHRAL RESECTION OF PROSTATE N/A 01/15/2020   Procedure: TRANSURETHRAL RESECTION OF THE PROSTATE (TURP)  with General anesthesia and spinal;  Surgeon: Cleon Gustin, MD;  Location: [facility];  Service: Urology;  Laterality: N/A;       Family History  Problem Relation Age of Onset   Diabetes Mother    Heart attack Mother    Heart attack Father    Diabetes Brother     Social History   Tobacco Use   Smoking status: Former   Smokeless tobacco: Never  Scientific laboratory technician Use: Never used  Substance Use Topics   Alcohol use: Not Currently   Drug use: Never    Home Medications Prior to Admission medications   Medication Sig Start Date End Date Taking? Authorizing Provider  acetaminophen (TYLENOL) 500 MG tablet Take 1,000 mg by mouth every 6 (six) hours as needed for moderate pain or headache.    [provider]  apixaban (ELIQUIS) 5 MG TABS tablet Take 1 tablet (5 mg total) by mouth 2 (two) times daily. 01/27/21   Kathie Dike, MD  Ascorbic Acid (VITAMIN C) 500 MG CHEW Chew 1,000 mg by mouth daily.    [provider]  cholecalciferol (VITAMIN D3) 25 MCG (1000 UNIT) tablet Take by mouth daily.    [provider]  collagenase (SANTYL) ointment Apply to right lateral malleolus once daily; apply in a 1/8 inch layer and top with saline moistened gauze 2X2. 11/05/20   Barton Dubois, MD  docusate sodium (COLACE) 100 MG capsule Take 100 mg by mouth 2 (two) times daily.    [provider]  ferrous sulfate 325 (65 FE) MG tablet Take 325 mg by mouth 2 (two) times daily with a meal.    [provider]  Insulin NPH Isophane & Regular (HUMULIN 70/30 KWIKPEN [place]) Inject  5 Units into the skin 2 (two) times daily.    [provider]  Lactulose 20 GM/30ML SOLN Take 30 mLs (20 g total) by mouth daily. Patient taking differently: Take 30 mLs by mouth 2 (two) times daily. 11/11/20   [name], MD  metoprolol succinate (TOPROL-XL) 25 MG 24 hr tablet Take 1 tablet (25 mg total) by mouth in the morning and at bedtime. 01/27/21   Kathie Dike, MD  Multiple Vitamin (MULTIVITAMIN WITH MINERALS) TABS tablet Take 1 tablet by mouth daily.    [provider]  Nutritional Supplements (FEEDING SUPPLEMENT, NEPRO CARB STEADY,) LIQD Take 237 mLs by mouth 2 (two) times daily between meals. 11/05/20   Barton Dubois, MD  Omega-3 Fatty Acids (FISH OIL PO) Take 2,400 mg by mouth 2 (two) times daily.    [provider]  ondansetron (ZOFRAN) 4 MG tablet Take 4 mg by mouth every 6 (six) hours. Prn.    [provider]  oxycodone (OXY-IR) 5 MG capsule Take 5 mg by mouth every 6 (six) hours as needed.    [provider]  polyethylene glycol (MIRALAX / GLYCOLAX) 17 g packet Take 17 g by mouth daily. 01/28/21   Kathie Dike, MD  polyethylene glycol-electrolytes (TRILYTE) 420 g solution Take 4,000 mLs by mouth as directed. 04/26/21   Harvel Quale, MD  rosuvastatin (CRESTOR) 5 MG tablet Take 5 mg by mouth daily.    [provider]  senna (SENOKOT) 8.6 MG tablet Take 1 tablet by mouth 2 (two) times daily.    [provider]  sevelamer carbonate (RENVELA) 800 MG tablet Take 1 tablet (800 mg total) by mouth 2 (two) times daily with a meal. 01/27/21   Kathie Dike, MD  sodium phosphate Pediatric (FLEET) 3.5-9.5 GM/59ML enema Place 1 enema rectally once as needed for severe constipation.    [provider]  tamsulosin (FLOMAX) 0.4 MG CAPS capsule Take 1 capsule (0.4 mg total) by mouth daily after supper. 03/11/21   [name], [name], MD  vitamin B-12 (CYANOCOBALAMIN) 1000 MCG tablet Take 1,000 mcg by mouth  daily.    [provider]  vitamin E 1000 UNIT capsule Take 1,000 Units by mouth daily.    [provider]  Zinc 30 MG CAPS Take 1 capsule by mouth daily.    [provider]    Allergies    Dust mite extract, Prednisone, and Rocephin [ceftriaxone]  Review of Systems   Review of Systems  Constitutional:  Negative for chills, fatigue and fever.  Respiratory:  Negative for cough and shortness of breath.   Cardiovascular:  Negative for chest pain and palpitations.  Gastrointestinal:  Negative for abdominal pain, nausea and vomiting.  Genitourinary:  Negative for dysuria, flank pain and hematuria.  Musculoskeletal:  Positive for arthralgias (left arm pain and swelling). Negative for back pain, myalgias, neck pain and neck stiffness.  Skin:  Negative for rash.       Bruising left arm  Neurological:  Negative for dizziness, weakness, numbness and headaches.  Hematological:  Does not bruise/bleed easily.   Physical Exam Updated Vital Signs BP (!) 113/56 (BP Location: Right Arm)   Pulse (!) 111   Temp 98.1 F (36.7 C) (Oral)   Resp 16   Ht 6\' 1"  (1.854 m)   Wt 108.9 kg   SpO2 98%   BMI 31.66 kg/m   Physical Exam Vitals and nursing note reviewed.  Constitutional:      Appearance: Normal appearance. He is not ill-appearing or toxic-appearing.  HENT:     Head: Atraumatic.     Mouth/Throat:     Mouth: Mucous membranes are dry.  Cardiovascular:     Rate and Rhythm: Normal rate and regular rhythm.     Pulses: Normal pulses.     Comments: Palpable radial pulse noted left wrist, symmetrical to the right.  Palpable thrill present  Pulmonary:     Effort: Pulmonary effort is normal.  Abdominal:     Palpations: Abdomen is soft.     Tenderness: There is no abdominal tenderness.  Musculoskeletal:        General: Swelling present. No signs of injury.     Cervical back: Normal range of motion.     Comments: Soft tissue swelling noted from mid left forearm to  mid left upper arm.  No excessive warmth or erythema.  Scattered ecchymosis noted to antecubital fossa area. Pt able to perform ROM of the left wrist and elbow w/o difficulty    Skin:    General: Skin is warm.     Capillary Refill: Capillary refill takes less than 2 seconds.  Neurological:     General: No focal deficit present.     Mental Status: He is alert.     Sensory: No sensory deficit.     Motor: No weakness.    ED Results / Procedures / Treatments   Labs (all labs ordered are listed, but only abnormal results are displayed) Labs Reviewed - No data to display  EKG None  Radiology US Venous Img Upper Uni Left  Result Date: 05/04/2021 CLINICAL DATA:  swelling left arm.  recent placement of AV fistula EXAM: LEFT UPPER EXTREMITY VENOUS DOPPLER ULTRASOUND TECHNIQUE: Gray-scale sonography with graded compression, as well as color Doppler and duplex ultrasound were performed to evaluate the upper extremity deep venous system from the level of the subclavian vein and including the jugular, axillary, basilic, radial, ulnar and upper cephalic vein. Spectral Doppler was utilized to evaluate flow at rest and with distal augmentation maneuvers. COMPARISON:  None. FINDINGS: Contralateral Subclavian Vein: Respiratory phasicity is normal and symmetric with the symptomatic side. No evidence of thrombus. Normal compressibility. Internal Jugular Vein: No evidence of thrombus. Normal compressibility, respiratory phasicity and response to augmentation. Subclavian Vein: No evidence of thrombus. Normal compressibility, respiratory phasicity and response to augmentation. Axillary Vein: No evidence of thrombus. Normal compressibility, respiratory phasicity and response to augmentation. Cephalic Vein: Positive for visible occlusive thrombus. No compressibility. Basilic Vein: Positive for visible nonocclusive thrombus distally near the elbow. No compressibility. Brachial Veins: No evidence of thrombus. Normal  compressibility, respiratory phasicity and response to augmentation. Radial Veins: No evidence of thrombus. Normal compressibility, respiratory phasicity and response to augmentation. Ulnar Veins: No evidence of thrombus. Normal compressibility, respiratory phasicity and response to augmentation. Other Findings: The visualized recent AV fistula it appears grossly patent. IMPRESSION: 1. Positive for superficial venous thrombosis of the left cephalic (occlusive) and basilic (nonocclusive) veins. 2. No evidence of deep venous thrombosis. Electronically Signed   By: Margaretha Sheffield M.D.   On: 05/04/2021 11:15    Procedures Procedures   Medications Ordered in ED Medications - No data to display  ED Course  I have reviewed the triage vital signs and the nursing notes.  Pertinent labs & imaging results that were available during my care of the patient were reviewed by me and considered in my medical decision making (see chart for details).    MDM Rules/Calculators/A&P                           Patient is currently at [facility] for rehab after placement of AV fistula for hemodialysis.  Sent here for evaluation of swelling of the left arm.  On exam, patient has soft tissue swelling noted from mid left forearm to mid left upper arm.  There is some ecchymosis along the antecubital fossa, but no confluent erythema, lymphangitis or excessive warmth.  He has good cap refill of the fingers with palpable thrill and radial pulses are palpable as well.  No concerning symptoms for ischemic extremity. He is anticoagulated with Eliquis.    Labs interpreted by me, no evidence of leukocytosis.  Chemistries consistent with CKD, patient did not receive his dialysis treatment yesterday stating that he was constipated and  unable to keep his dialysis appointment.  Scheduled for dialysis tomorrow.  Venous ultrasound shows superficial thrombosis without DVT.  Patient does have follow-up appointment with vascular  surgery in 3 weeks.  I feel that he is appropriate for return back to SNF.  He is agreeable to elevate and warm compresses to the extremity. All questions answered.  Return precautions discussed.    Final Clinical Impression(s) / ED Diagnoses Final diagnoses:  Superficial venous thrombosis of left arm    Rx / DC Orders ED Discharge Orders     None        Kem Parkinson, PA-C 05/04/21 1151    Milton Ferguson, MD 05/05/21 [phone]

## 2021-05-04 NOTE — ED Triage Notes (Signed)
Pt arrived via EMS from [facility]. Pt has swelling in left arm. Fistula placed 8 days ago. Bruising around the site and swelling noted.

## 2021-05-04 NOTE — Discharge Instructions (Signed)
The ultrasound of your arm today shows some small clots of the superficial veins of your arm, but no deep vein clot.  I recommend that you elevate your arm when possible and apply warm compresses on and off.  You may take Tylenol every 4 hours if needed for pain.  Contact your vascular surgeon to arrange follow-up.  Return to the emergency department for any new or worsening symptoms.

## 2021-05-05 DIAGNOSIS — Z23 Encounter for immunization: Secondary | ICD-10-CM | POA: Diagnosis not present

## 2021-05-05 DIAGNOSIS — Z992 Dependence on renal dialysis: Secondary | ICD-10-CM | POA: Diagnosis not present

## 2021-05-05 DIAGNOSIS — N186 End stage renal disease: Secondary | ICD-10-CM | POA: Diagnosis not present

## 2021-05-07 DIAGNOSIS — N186 End stage renal disease: Secondary | ICD-10-CM | POA: Diagnosis not present

## 2021-05-07 DIAGNOSIS — Z992 Dependence on renal dialysis: Secondary | ICD-10-CM | POA: Diagnosis not present

## 2021-05-07 DIAGNOSIS — Z23 Encounter for immunization: Secondary | ICD-10-CM | POA: Diagnosis not present

## 2021-05-09 ENCOUNTER — Other Ambulatory Visit: Payer: Self-pay

## 2021-05-09 ENCOUNTER — Inpatient Hospital Stay (HOSPITAL_COMMUNITY): Payer: Medicare HMO | Attending: Hematology

## 2021-05-09 DIAGNOSIS — E1122 Type 2 diabetes mellitus with diabetic chronic kidney disease: Secondary | ICD-10-CM | POA: Diagnosis not present

## 2021-05-09 DIAGNOSIS — I132 Hypertensive heart and chronic kidney disease with heart failure and with stage 5 chronic kidney disease, or end stage renal disease: Secondary | ICD-10-CM | POA: Diagnosis not present

## 2021-05-09 DIAGNOSIS — Z992 Dependence on renal dialysis: Secondary | ICD-10-CM | POA: Insufficient documentation

## 2021-05-09 DIAGNOSIS — D472 Monoclonal gammopathy: Secondary | ICD-10-CM | POA: Diagnosis present

## 2021-05-09 DIAGNOSIS — E1169 Type 2 diabetes mellitus with other specified complication: Secondary | ICD-10-CM | POA: Diagnosis not present

## 2021-05-09 DIAGNOSIS — D631 Anemia in chronic kidney disease: Secondary | ICD-10-CM | POA: Diagnosis not present

## 2021-05-09 DIAGNOSIS — I5030 Unspecified diastolic (congestive) heart failure: Secondary | ICD-10-CM | POA: Diagnosis not present

## 2021-05-09 DIAGNOSIS — Z79899 Other long term (current) drug therapy: Secondary | ICD-10-CM | POA: Diagnosis not present

## 2021-05-09 DIAGNOSIS — N186 End stage renal disease: Secondary | ICD-10-CM | POA: Insufficient documentation

## 2021-05-09 DIAGNOSIS — M19071 Primary osteoarthritis, right ankle and foot: Secondary | ICD-10-CM | POA: Diagnosis not present

## 2021-05-09 DIAGNOSIS — R5381 Other malaise: Secondary | ICD-10-CM | POA: Diagnosis not present

## 2021-05-09 DIAGNOSIS — E785 Hyperlipidemia, unspecified: Secondary | ICD-10-CM | POA: Diagnosis not present

## 2021-05-09 DIAGNOSIS — R52 Pain, unspecified: Secondary | ICD-10-CM | POA: Diagnosis not present

## 2021-05-09 DIAGNOSIS — E538 Deficiency of other specified B group vitamins: Secondary | ICD-10-CM | POA: Diagnosis not present

## 2021-05-09 DIAGNOSIS — I10 Essential (primary) hypertension: Secondary | ICD-10-CM | POA: Diagnosis not present

## 2021-05-09 DIAGNOSIS — D508 Other iron deficiency anemias: Secondary | ICD-10-CM

## 2021-05-09 LAB — FERRITIN: Ferritin: 620 ng/mL — ABNORMAL HIGH (ref 24–336)

## 2021-05-09 LAB — COMPREHENSIVE METABOLIC PANEL
ALT: 40 U/L (ref 0–44)
AST: 42 U/L — ABNORMAL HIGH (ref 15–41)
Albumin: 2.6 g/dL — ABNORMAL LOW (ref 3.5–5.0)
Alkaline Phosphatase: 173 U/L — ABNORMAL HIGH (ref 38–126)
Anion gap: 8 (ref 5–15)
BUN: 55 mg/dL — ABNORMAL HIGH (ref 8–23)
CO2: 28 mmol/L (ref 22–32)
Calcium: 8.8 mg/dL — ABNORMAL LOW (ref 8.9–10.3)
Chloride: 97 mmol/L — ABNORMAL LOW (ref 98–111)
Creatinine, Ser: 3.45 mg/dL — ABNORMAL HIGH (ref 0.61–1.24)
GFR, Estimated: 19 mL/min — ABNORMAL LOW (ref >60)
Glucose, Bld: 202 mg/dL — ABNORMAL HIGH (ref 70–99)
Potassium: 3.7 mmol/L (ref 3.5–5.1)
Sodium: 133 mmol/L — ABNORMAL LOW (ref 135–145)
Total Bilirubin: 1.2 mg/dL (ref 0.3–1.2)
Total Protein: 7.3 g/dL (ref 6.5–8.1)

## 2021-05-09 LAB — IRON AND TIBC
Iron: 68 ug/dL (ref 45–182)
Saturation Ratios: 25 % (ref 17.9–39.5)
TIBC: 269 ug/dL (ref 250–450)
UIBC: 201 ug/dL

## 2021-05-09 LAB — CBC WITH DIFFERENTIAL/PLATELET
Abs Immature Granulocytes: 0.04 10*3/uL (ref 0.00–0.07)
Basophils Absolute: 0.1 10*3/uL (ref 0.0–0.1)
Basophils Relative: 1 %
Eosinophils Absolute: 0.4 10*3/uL (ref 0.0–0.5)
Eosinophils Relative: 4 %
HCT: 29.5 % — ABNORMAL LOW (ref 39.0–52.0)
Hemoglobin: 9.7 g/dL — ABNORMAL LOW (ref 13.0–17.0)
Immature Granulocytes: 0 %
Lymphocytes Relative: 11 %
Lymphs Abs: 1.1 10*3/uL (ref 0.7–4.0)
MCH: 29 pg (ref 26.0–34.0)
MCHC: 32.9 g/dL (ref 30.0–36.0)
MCV: 88.3 fL (ref 80.0–100.0)
Monocytes Absolute: 0.6 10*3/uL (ref 0.1–1.0)
Monocytes Relative: 6 %
Neutro Abs: 7.8 10*3/uL — ABNORMAL HIGH (ref 1.7–7.7)
Neutrophils Relative %: 78 %
Platelets: 223 10*3/uL (ref 150–400)
RBC: 3.34 MIL/uL — ABNORMAL LOW (ref 4.22–5.81)
RDW: 18.8 % — ABNORMAL HIGH (ref 11.5–15.5)
WBC: 10.1 10*3/uL (ref 4.0–10.5)
nRBC: 0.2 % (ref 0.0–0.2)

## 2021-05-09 LAB — LACTATE DEHYDROGENASE: LDH: 135 U/L (ref 98–192)

## 2021-05-09 LAB — VITAMIN B12: Vitamin B-12: 1426 pg/mL — ABNORMAL HIGH (ref 180–914)

## 2021-05-10 ENCOUNTER — Encounter (HOSPITAL_COMMUNITY)
Admit: 2021-05-10 | Discharge: 2021-05-10 | Disposition: A | Discharge status: Home / Self Care | Payer: Medicare HMO | Attending: Gastroenterology | Admitting: Gastroenterology

## 2021-05-10 DIAGNOSIS — Z23 Encounter for immunization: Secondary | ICD-10-CM | POA: Diagnosis not present

## 2021-05-10 DIAGNOSIS — Z992 Dependence on renal dialysis: Secondary | ICD-10-CM | POA: Diagnosis not present

## 2021-05-10 DIAGNOSIS — N186 End stage renal disease: Secondary | ICD-10-CM | POA: Diagnosis not present

## 2021-05-10 HISTORY — DX: Non-pressure chronic ulcer of unspecified ankle with unspecified severity: L97.309

## 2021-05-11 DIAGNOSIS — M6281 Muscle weakness (generalized): Secondary | ICD-10-CM | POA: Diagnosis not present

## 2021-05-11 DIAGNOSIS — N184 Chronic kidney disease, stage 4 (severe): Secondary | ICD-10-CM | POA: Diagnosis not present

## 2021-05-11 DIAGNOSIS — L89513 Pressure ulcer of right ankle, stage 3: Secondary | ICD-10-CM | POA: Diagnosis not present

## 2021-05-11 DIAGNOSIS — E669 Obesity, unspecified: Secondary | ICD-10-CM | POA: Diagnosis not present

## 2021-05-11 LAB — PROTEIN ELECTROPHORESIS, SERUM
A/G Ratio: 0.7 (ref 0.7–1.7)
Albumin ELP: 2.7 g/dL — ABNORMAL LOW (ref 2.9–4.4)
Alpha-1-Globulin: 0.2 g/dL (ref 0.0–0.4)
Alpha-2-Globulin: 0.8 g/dL (ref 0.4–1.0)
Beta Globulin: 1 g/dL (ref 0.7–1.3)
Gamma Globulin: 1.9 g/dL — ABNORMAL HIGH (ref 0.4–1.8)
Globulin, Total: 3.8 g/dL (ref 2.2–3.9)
Total Protein ELP: 6.5 g/dL (ref 6.0–8.5)

## 2021-05-11 LAB — IMMUNOFIXATION ELECTROPHORESIS
IgA: 419 mg/dL (ref 61–437)
IgG (Immunoglobin G), Serum: 1889 mg/dL — ABNORMAL HIGH (ref 603–1613)
IgM (Immunoglobulin M), Srm: 472 mg/dL — ABNORMAL HIGH (ref 20–172)
Total Protein ELP: 6.7 g/dL (ref 6.0–8.5)

## 2021-05-11 LAB — KAPPA/LAMBDA LIGHT CHAINS
Kappa free light chain: 233.4 mg/L — ABNORMAL HIGH (ref 3.3–19.4)
Kappa, lambda light chain ratio: 1.55 (ref 0.26–1.65)
Lambda free light chains: 150.1 mg/L — ABNORMAL HIGH (ref 5.7–26.3)

## 2021-05-12 ENCOUNTER — Encounter (HOSPITAL_COMMUNITY): Payer: Self-pay

## 2021-05-12 DIAGNOSIS — Z79899 Other long term (current) drug therapy: Secondary | ICD-10-CM | POA: Diagnosis not present

## 2021-05-12 DIAGNOSIS — E785 Hyperlipidemia, unspecified: Secondary | ICD-10-CM | POA: Diagnosis not present

## 2021-05-12 DIAGNOSIS — Z794 Long term (current) use of insulin: Secondary | ICD-10-CM | POA: Diagnosis not present

## 2021-05-12 DIAGNOSIS — R5381 Other malaise: Secondary | ICD-10-CM | POA: Diagnosis not present

## 2021-05-12 DIAGNOSIS — I5030 Unspecified diastolic (congestive) heart failure: Secondary | ICD-10-CM | POA: Diagnosis not present

## 2021-05-12 DIAGNOSIS — I509 Heart failure, unspecified: Secondary | ICD-10-CM | POA: Diagnosis not present

## 2021-05-12 DIAGNOSIS — E1169 Type 2 diabetes mellitus with other specified complication: Secondary | ICD-10-CM | POA: Diagnosis not present

## 2021-05-12 DIAGNOSIS — I10 Essential (primary) hypertension: Secondary | ICD-10-CM | POA: Diagnosis not present

## 2021-05-13 ENCOUNTER — Encounter (HOSPITAL_COMMUNITY): Admission: RE | Payer: Self-pay | Source: Ambulatory Visit

## 2021-05-13 ENCOUNTER — Ambulatory Visit (HOSPITAL_COMMUNITY): Admission: RE | Admit: 2021-05-13 | Payer: Medicare HMO | Source: Ambulatory Visit | Admitting: Gastroenterology

## 2021-05-13 SURGERY — COLONOSCOPY WITH PROPOFOL
Anesthesia: Monitor Anesthesia Care

## 2021-05-14 DIAGNOSIS — N186 End stage renal disease: Secondary | ICD-10-CM | POA: Diagnosis not present

## 2021-05-14 DIAGNOSIS — Z992 Dependence on renal dialysis: Secondary | ICD-10-CM | POA: Diagnosis not present

## 2021-05-14 DIAGNOSIS — Z23 Encounter for immunization: Secondary | ICD-10-CM | POA: Diagnosis not present

## 2021-05-16 ENCOUNTER — Inpatient Hospital Stay (HOSPITAL_BASED_OUTPATIENT_CLINIC_OR_DEPARTMENT_OTHER): Payer: Medicare HMO | Admitting: Physician Assistant

## 2021-05-16 ENCOUNTER — Other Ambulatory Visit: Payer: Self-pay

## 2021-05-16 VITALS — BP 114/73 | HR 68 | Temp 97.8°F | Resp 18

## 2021-05-16 DIAGNOSIS — D472 Monoclonal gammopathy: Secondary | ICD-10-CM

## 2021-05-16 DIAGNOSIS — I132 Hypertensive heart and chronic kidney disease with heart failure and with stage 5 chronic kidney disease, or end stage renal disease: Secondary | ICD-10-CM | POA: Diagnosis not present

## 2021-05-16 DIAGNOSIS — I509 Heart failure, unspecified: Secondary | ICD-10-CM | POA: Diagnosis not present

## 2021-05-16 DIAGNOSIS — E1169 Type 2 diabetes mellitus with other specified complication: Secondary | ICD-10-CM | POA: Diagnosis not present

## 2021-05-16 DIAGNOSIS — D631 Anemia in chronic kidney disease: Secondary | ICD-10-CM | POA: Diagnosis not present

## 2021-05-16 DIAGNOSIS — D649 Anemia, unspecified: Secondary | ICD-10-CM | POA: Diagnosis not present

## 2021-05-16 DIAGNOSIS — E538 Deficiency of other specified B group vitamins: Secondary | ICD-10-CM

## 2021-05-16 DIAGNOSIS — M19071 Primary osteoarthritis, right ankle and foot: Secondary | ICD-10-CM | POA: Diagnosis not present

## 2021-05-16 DIAGNOSIS — R5381 Other malaise: Secondary | ICD-10-CM | POA: Diagnosis not present

## 2021-05-16 DIAGNOSIS — Z79899 Other long term (current) drug therapy: Secondary | ICD-10-CM | POA: Diagnosis not present

## 2021-05-16 DIAGNOSIS — N186 End stage renal disease: Secondary | ICD-10-CM | POA: Diagnosis not present

## 2021-05-16 DIAGNOSIS — Z992 Dependence on renal dialysis: Secondary | ICD-10-CM | POA: Diagnosis not present

## 2021-05-16 DIAGNOSIS — E1122 Type 2 diabetes mellitus with diabetic chronic kidney disease: Secondary | ICD-10-CM | POA: Diagnosis not present

## 2021-05-16 MED ORDER — CYANOCOBALAMIN 1000 MCG/ML IJ SOLN
1000.0000 ug | Freq: Once | INTRAMUSCULAR | Status: AC
  Administered 2021-05-16: 1000 ug via INTRAMUSCULAR
  Filled 2021-05-16: qty 1

## 2021-05-16 NOTE — Progress Notes (Signed)
[place], [place] [address]   CLINIC:  Medical Oncology/Hematology  PCP:  Celene Squibb, MD [address] [place] Cro[place] [address] [phone]   REASON FOR VISIT:  Follow-up for MGUS and anemia of CKD  PRIOR THERAPY: Retacrit  CURRENT THERAPY: Epogen injections at dialysis  INTERVAL HISTORY:  Danny [name] 65 y.o. male returns for routine follow-up of his anemia of ESRD and MGUS.  He was last seen by Tarri Abernethy PA-C on 02/09/2021.  At today's visit, he reports feeling fair.  No recent hospitalizations, surgeries, or changes in baseline health status.  He continues to reside at [facility]. He is receiving Epogen at dialysis.  He is on Xarelto for atrial fibrillation.  As previously noted, patient was Hemoccult positive x3 in June 2022, but denies any obvious bleeding such as bright red blood per rectum, melena, or epistaxis.  He has been following with GI and is scheduled for upcoming EGD/colonoscopy.  He reports significant fatigue on dialysis days and thinks that his energy is decreasing as he becomes increasingly bedbound.  He reports lightheadedness during dialysis.  No chest pain, dyspnea on exertion, syncope, or palpitations.  Regarding his MGUS, he denies any bone pain or hyperviscosity symptoms. He has chronic diabetic neuropathy of feet, but denies any recent change in his symptoms. He denies B symptoms such as fever, chills, night sweats. He does not note any new lumps or bumps.  He has 50% energy and 100% appetite. He has been slowly losing weight since being admitted to SNF, which he attributes to not eating as much food and having fluids removed via hemodialysis.   REVIEW OF SYSTEMS:  Review of Systems  Constitutional:  Positive for fatigue. Negative for appetite change, chills, diaphoresis, fever and unexpected weight change.  HENT:   Negative for lump/mass and nosebleeds.   Eyes:  Negative for eye problems.   Respiratory:  Negative for cough, hemoptysis and shortness of breath.   Cardiovascular:  Negative for chest pain, leg swelling and palpitations.  Gastrointestinal:  Positive for constipation and nausea. Negative for abdominal pain, blood in stool, diarrhea and vomiting.  Genitourinary:  Negative for hematuria.   Musculoskeletal:  Positive for arthralgias.  Skin: Negative.   Neurological:  Positive for headaches, light-headedness and numbness. Negative for dizziness.  Hematological:  Does not bruise/bleed easily.  Psychiatric/Behavioral:  Positive for sleep disturbance.      PAST MEDICAL/SURGICAL HISTORY:  Past Medical History:  Diagnosis Date   Anemia    Ankle ulcer ([place])    Arthritis    CKD (chronic kidney disease), stage V (HCC)    Diabetes mellitus without complication (HCC)    Diastolic congestive heart failure ([place])    Foot ulcer ([place])    Gout    Hypertension    Urinary retention    Past Surgical History:  Procedure Laterality Date   ANKLE SURGERY Right    AV FISTULA PLACEMENT Left 04/26/2021   Procedure: LEFT ARM ARTERIOVENOUS (AV) FISTULA CREATION (BRACHIOCEPHALIC);  Surgeon: Rosetta Posner, MD;  Location: [facility];  Service: Vascular;  Laterality: Left;   CHOLECYSTECTOMY     FOOT SURGERY Right    INSERTION OF DIALYSIS CATHETER Right 01/14/2021   Procedure: INSERTION OF TUNNELED DIALYSIS CATHETER RIGHT INTERNAL JUGULAR;  Surgeon: Virl Cagey, MD;  Location: [facility];  Service: General;  Laterality: Right;   TRANSURETHRAL RESECTION OF PROSTATE N/A 01/15/2020   Procedure: TRANSURETHRAL RESECTION OF THE PROSTATE (TURP)  with General anesthesia and spinal;  Surgeon: Cleon Gustin, MD;  Location: [facility];  Service: Urology;  Laterality: N/A;     SOCIAL HISTORY:  Social History   Socioeconomic History   Marital status: Divorced    Spouse name: Not on file   Number of children: Not on file   Years of education: Not on file   Highest education level: Not on file   Occupational History   Not on file  Tobacco Use   Smoking status: Former   Smokeless tobacco: Never  Vaping Use   Vaping Use: Never used  Substance and Sexual Activity   Alcohol use: Not Currently   Drug use: Never   Sexual activity: Not Currently  Other Topics Concern   Not on file  Social History Narrative   Not on file   Social Determinants of Health   Financial Resource Strain: Low Risk    Difficulty of Paying Living Expenses: Not very hard  Food Insecurity: No Food Insecurity   Worried About Running Out of Food in the Last Year: Never true   [place] in the Last Year: Never true  Transportation Needs: No Transportation Needs   Lack of Transportation (Medical): No   Lack of Transportation (Non-Medical): No  Physical Activity: Inactive   Days of Exercise per Week: 0 days   Minutes of Exercise per Session: 0 min  Stress: No Stress Concern Present   Feeling of Stress : Only a little  Social Connections: Not on file  Intimate Partner Violence: Not At Risk   Fear of Current or Ex-Partner: No   Emotionally Abused: No   Physically Abused: No   Sexually Abused: No    FAMILY HISTORY:  Family History  Problem Relation Age of Onset   Diabetes Mother    Heart attack Mother    Heart attack Father    Diabetes Brother     CURRENT MEDICATIONS:  Outpatient Encounter Medications as of 05/16/2021  Medication Sig Note   acetaminophen (TYLENOL) 500 MG tablet Take 1,000 mg by mouth every 6 (six) hours as needed for moderate pain or headache.    albuterol (ACCUNEB) 0.63 MG/3ML nebulizer solution Take by nebulization. 05/11/2021: Last filled 04/25/21   apixaban (ELIQUIS) 5 MG TABS tablet Take 1 tablet (5 mg total) by mouth 2 (two) times daily. 05/11/2021: Last filled 04/21/21 for 8 tablets   Ascorbic Acid (VITAMIN C) 500 MG CHEW Chew 1,000 mg by mouth daily.    cholecalciferol (VITAMIN D3) 25 MCG (1000 UNIT) tablet Take by mouth daily.    collagenase (SANTYL) ointment  Apply to right lateral malleolus once daily; apply in a 1/8 inch layer and top with saline moistened gauze 2X2. 05/11/2021: Last filled 05/04/21   docusate sodium (COLACE) 100 MG capsule Take 100 mg by mouth 2 (two) times daily.    ferrous sulfate 325 (65 FE) MG tablet Take 325 mg by mouth 2 (two) times daily with a meal.    Insulin NPH Isophane & Regular (HUMULIN 70/30 KWIKPEN ) Inject 5 Units into the skin 2 (two) times daily. 05/11/2021: Last filled 04/23/21   Lactulose 20 GM/30ML SOLN Take 30 mLs (20 g total) by mouth daily. (Patient taking differently: Take 30 mLs by mouth 2 (two) times daily.)    metoprolol succinate (TOPROL-XL) 25 MG 24 hr tablet Take 1 tablet (25 mg total) by mouth in the morning and at bedtime. 05/11/2021: Last filled 04/21/21   Multiple Vitamin (MULTIVITAMIN WITH MINERALS) TABS tablet  Take 1 tablet by mouth daily.    Nutritional Supplements (FEEDING SUPPLEMENT, NEPRO CARB STEADY,) LIQD Take 237 mLs by mouth 2 (two) times daily between meals.    Omega-3 Fatty Acids (FISH OIL PO) Take 2,400 mg by mouth 2 (two) times daily.    ondansetron (ZOFRAN) 4 MG tablet Take 4 mg by mouth every 6 (six) hours. Prn. 05/11/2021: No evidence of fill in past 6 months   oxycodone (OXY-IR) 5 MG capsule Take 5 mg by mouth every 6 (six) hours as needed. 05/11/2021: Last filled 05/02/21 for 30 tablets   polyethylene glycol (MIRALAX / GLYCOLAX) 17 g packet Take 17 g by mouth daily.    polyethylene glycol-electrolytes (TRILYTE) 420 g solution Take 4,000 mLs by mouth as directed. 05/11/2021: Filled 05/05/21   rosuvastatin (CRESTOR) 5 MG tablet Take 5 mg by mouth daily. 05/11/2021: Last filled 04/21/21   senna (SENOKOT) 8.6 MG tablet Take 1 tablet by mouth 2 (two) times daily.    sevelamer carbonate (RENVELA) 800 MG tablet Take 1 tablet (800 mg total) by mouth 2 (two) times daily with a meal. 05/11/2021: Last filled 11/29/20   sodium phosphate Pediatric (FLEET) 3.5-9.5 GM/59ML enema Place 1 enema  rectally once as needed for severe constipation.    tamsulosin (FLOMAX) 0.4 MG CAPS capsule Take 1 capsule (0.4 mg total) by mouth daily after supper. 05/11/2021: Last filled 03/11/21   vitamin B-12 (CYANOCOBALAMIN) 1000 MCG tablet Take 1,000 mcg by mouth daily.    vitamin E 1000 UNIT capsule Take 1,000 Units by mouth daily.    XARELTO 20 MG TABS tablet Take 20 mg by mouth daily. 05/11/2021: 14 day supply filled 05/05/21   Zinc 30 MG CAPS Take 1 capsule by mouth daily. 04/21/2021: 250 mg daily   No facility-administered encounter medications on file as of 05/16/2021.    ALLERGIES:  Allergies  Allergen Reactions   Dust Mite Extract Itching and Other (See Comments)    Unknown reaction-potential shortness of breath   Prednisone Nausea And Vomiting   Rocephin [Ceftriaxone] Nausea And Vomiting     PHYSICAL EXAM:  ECOG PERFORMANCE STATUS: 3 - Symptomatic, >50% confined to bed  There were no vitals filed for this visit. There were no vitals filed for this visit. Physical Exam Constitutional:      Appearance: Normal appearance. He is obese.     Comments: Somewhat weak appearing.  Presents in wheelchair.  HENT:     Head: Normocephalic and atraumatic.     Mouth/Throat:     Mouth: Mucous membranes are moist.  Eyes:     Extraocular Movements: Extraocular movements intact.     Pupils: Pupils are equal, round, and reactive to light.  Cardiovascular:     Rate and Rhythm: Normal rate and regular rhythm.     Pulses: Normal pulses.     Heart sounds: Normal heart sounds.  Pulmonary:     Effort: Pulmonary effort is normal.     Breath sounds: Normal breath sounds.  Abdominal:     General: Bowel sounds are normal.     Palpations: Abdomen is soft.     Tenderness: There is no abdominal tenderness.  Musculoskeletal:        General: No swelling.     Right lower leg: No edema.     Left lower leg: No edema.  Lymphadenopathy:     Cervical: No cervical adenopathy.  Skin:    General: Skin is  warm and dry.  Neurological:     General: No  focal deficit present.     Mental Status: He is alert and oriented to person, place, and time.  Psychiatric:        Mood and Affect: Mood normal.        Behavior: Behavior normal.     LABORATORY DATA:  I have reviewed the labs as listed.  CBC    Component Value Date/Time   WBC 10.1 05/09/2021 0942   RBC 3.34 (L) 05/09/2021 0942   HGB 9.7 (L) 05/09/2021 0942   HCT 29.5 (L) 05/09/2021 0942   PLT 223 05/09/2021 0942   MCV 88.3 05/09/2021 0942   MCH 29.0 05/09/2021 0942   MCHC 32.9 05/09/2021 0942   RDW 18.8 (H) 05/09/2021 0942   LYMPHSABS 1.1 05/09/2021 0942   MONOABS 0.6 05/09/2021 0942   EOSABS 0.4 05/09/2021 0942   BASOSABS 0.1 05/09/2021 0942   CMP Latest Ref Rng & Units 05/09/2021 05/04/2021 04/26/2021  Glucose 70 - 99 mg/dL 202(H) 152(H) 164(H)  BUN 8 - 23 mg/dL 55(H) 66(H) 42(H)  Creatinine 0.61 - 1.24 mg/dL 3.45(H) 4.61(H) 2.70(H)  Sodium 135 - 145 mmol/L 133(L) 133(L) 136  Potassium 3.5 - 5.1 mmol/L 3.7 4.0 4.5  Chloride 98 - 111 mmol/L 97(L) 96(L) 100  CO2 22 - 32 mmol/L 28 24 -  Calcium 8.9 - 10.3 mg/dL 8.8(L) 8.6(L) -  Total Protein 6.5 - 8.1 g/dL 7.3 - -  Total Bilirubin 0.3 - 1.2 mg/dL 1.2 - -  Alkaline Phos 38 - 126 U/L 173(H) - -  AST 15 - 41 U/L 42(H) - -  ALT 0 - 44 U/L 40 - -    DIAGNOSTIC IMAGING:  I have independently reviewed the relevant imaging and discussed with the patient.  ASSESSMENT & PLAN: 1.  Normocytic anemia - Multifactorial etiology related to ESRD and relative iron deficiency - No prior history of transfusion.  Has never had an EGD or colonoscopy. - Negative work-up for hemolysis (normal LDH, haptoglobin, reticulocyte count, direct [place]) - Nutritional deficiency work-up showed B12 deficiency (see below), but normal folic acid and copper; normal vitamin D - Patient stopped taking oral iron supplement due to constipation - Patient is now receiving Epogen injections at hemodialysis -  Found to have Hemoccult stool positive x3 in June 2022 - was referred to gastroenterology, is waiting to be scheduled for EGD/colonoscopy. - He takes Xarelto due to atrial fibrillation. - He denies any gross signs or symptoms of blood loss such as bright red blood per rectum or melena. - Most recent labs (05/09/2021): Hgb 9.7/MCV 88.3, ferritin 620, iron saturation 25% - PLAN: Continue Epogen injections at dialysis. - No indication for IV iron supplementation at this time. - Recommend continued follow-up with GI for possible occult GI bleeding.  Agree with upcoming EGD/colonoscopy. - Repeat CBC and iron panel in 3 months.  2.  Monoclonal gammopathy of unknown significance, IgG lambda, under work-up - Work-up for other causes of anemia above revealed immunofixation (09/13/2020) with IgG lambda monoclonal protein; no M spike evident on SPEP; elevated lambda light chains 163.5, elevated kappa light chains 186.6, normal light chain ratio 1.14 - Skeletal survey (11/16/2020): No definite lytic destruction or lucency noted in the visualized skeleton - Most recent MGUS/myeloma panel (05/09/2021): SPEP negative for M spike.  Elevated kappa free light chain 233.4, elevated lambda 150.1, and normal ratio 1.55. - No definite CRAB features: Patient does have a markedly elevated creatinine due to longstanding CKD and poorly controlled diabetes/hypertension, anemia thought to be due to anemia  of ESRD.  Most recent calcium 8.8 (05/09/2021).  LDH normal. -He denies any new bone pain or B symptoms  - 24-hour urine with UPEP/IFE was ordered, but unable to be completed initially as patient is incontinent of urine and wears adult briefs - PLAN: Repeat MGUS panel in 3 months. - We will check 24-hour urine via Foley catheter now the patient resides at SNF.  Foley catheter to be inserted at SNF, with strict instructions for to be removed after collection of 24-hour urine specimen.  3.  B12 deficiency - Labs on 11/04/2020  showed elevated methylmalonic acid 721, although B12 elevated at 1355 (likely reactive) - He was taking B12 pills at home, but they were not helping - He was started on monthly B12 injections - Most recent labs (05/09/2021): Elevated B12 1426 (methylmalonic acid not checked) - PLAN: We will give B12 injection x1 today.  Repeat B12 and methylmalonic acid in 3 months.   4.  ESRD on hemodialysis - Stage IV, since 2013, secondary to diabetes. - Started on hemodialysis in July 2022 - PLAN: Continue hemodialysis and follow-up with Dr. Theador Hawthorne   PLAN SUMMARY & DISPOSITION: Check 24-hour urine Labs in 3 months RTC after labs  All questions were answered. The patient knows to call the clinic with any problems, questions or concerns.  Medical decision making: Moderate  Time spent on visit: I spent 20 minutes counseling the patient face to face. The total time spent in the appointment was 30 minutes and more than 50% was on counseling.   [name], PA-C  05/16/2021 10:57 PM

## 2021-05-16 NOTE — Patient Instructions (Signed)

## 2021-05-16 NOTE — Progress Notes (Signed)
Patient given B12 injection per order.  Tolerated well without issues.  Discharged with CNA via wheelchair in stable condition.

## 2021-05-17 DIAGNOSIS — Z992 Dependence on renal dialysis: Secondary | ICD-10-CM | POA: Diagnosis not present

## 2021-05-17 DIAGNOSIS — F4323 Adjustment disorder with mixed anxiety and depressed mood: Secondary | ICD-10-CM | POA: Diagnosis not present

## 2021-05-17 DIAGNOSIS — F01A2 Vascular dementia, mild, with psychotic disturbance: Secondary | ICD-10-CM | POA: Diagnosis not present

## 2021-05-17 DIAGNOSIS — R69 Illness, unspecified: Secondary | ICD-10-CM | POA: Diagnosis not present

## 2021-05-17 DIAGNOSIS — F200 Paranoid schizophrenia: Secondary | ICD-10-CM | POA: Diagnosis not present

## 2021-05-17 DIAGNOSIS — Z23 Encounter for immunization: Secondary | ICD-10-CM | POA: Diagnosis not present

## 2021-05-17 DIAGNOSIS — G8929 Other chronic pain: Secondary | ICD-10-CM | POA: Diagnosis not present

## 2021-05-17 DIAGNOSIS — N186 End stage renal disease: Secondary | ICD-10-CM | POA: Diagnosis not present

## 2021-05-18 ENCOUNTER — Telehealth: Payer: Self-pay | Admitting: *Deleted

## 2021-05-18 DIAGNOSIS — E669 Obesity, unspecified: Secondary | ICD-10-CM | POA: Diagnosis not present

## 2021-05-18 DIAGNOSIS — N184 Chronic kidney disease, stage 4 (severe): Secondary | ICD-10-CM | POA: Diagnosis not present

## 2021-05-18 DIAGNOSIS — A041 Enterotoxigenic Escherichia coli infection: Secondary | ICD-10-CM | POA: Diagnosis not present

## 2021-05-18 DIAGNOSIS — L89513 Pressure ulcer of right ankle, stage 3: Secondary | ICD-10-CM | POA: Diagnosis not present

## 2021-05-18 DIAGNOSIS — M6281 Muscle weakness (generalized): Secondary | ICD-10-CM | POA: Diagnosis not present

## 2021-05-18 NOTE — Telephone Encounter (Signed)
Pt called to report swelling and pain in left arm after AVF creation on 11/1. Pt was seen in the ED for this problem on 11/9 and sent home. Swelling has not improved with elevation. Pt is taking tylenol and oxycodone for pain. I have moved up his post op appointment to next week. I have left a voicemail with [facility] scheduling and notified the pt.

## 2021-05-19 DIAGNOSIS — Z23 Encounter for immunization: Secondary | ICD-10-CM | POA: Diagnosis not present

## 2021-05-19 DIAGNOSIS — M19012 Primary osteoarthritis, left shoulder: Secondary | ICD-10-CM | POA: Diagnosis not present

## 2021-05-19 DIAGNOSIS — M19022 Primary osteoarthritis, left elbow: Secondary | ICD-10-CM | POA: Diagnosis not present

## 2021-05-19 DIAGNOSIS — Z992 Dependence on renal dialysis: Secondary | ICD-10-CM | POA: Diagnosis not present

## 2021-05-19 DIAGNOSIS — N186 End stage renal disease: Secondary | ICD-10-CM | POA: Diagnosis not present

## 2021-05-20 DIAGNOSIS — I5030 Unspecified diastolic (congestive) heart failure: Secondary | ICD-10-CM | POA: Diagnosis not present

## 2021-05-20 DIAGNOSIS — E785 Hyperlipidemia, unspecified: Secondary | ICD-10-CM | POA: Diagnosis not present

## 2021-05-20 DIAGNOSIS — Z5189 Encounter for other specified aftercare: Secondary | ICD-10-CM | POA: Diagnosis not present

## 2021-05-21 DIAGNOSIS — Z23 Encounter for immunization: Secondary | ICD-10-CM | POA: Diagnosis not present

## 2021-05-21 DIAGNOSIS — N186 End stage renal disease: Secondary | ICD-10-CM | POA: Diagnosis not present

## 2021-05-21 DIAGNOSIS — Z992 Dependence on renal dialysis: Secondary | ICD-10-CM | POA: Diagnosis not present

## 2021-05-24 DIAGNOSIS — Z23 Encounter for immunization: Secondary | ICD-10-CM | POA: Diagnosis not present

## 2021-05-24 DIAGNOSIS — N186 End stage renal disease: Secondary | ICD-10-CM | POA: Diagnosis not present

## 2021-05-24 DIAGNOSIS — Z992 Dependence on renal dialysis: Secondary | ICD-10-CM | POA: Diagnosis not present

## 2021-05-24 DIAGNOSIS — D509 Iron deficiency anemia, unspecified: Secondary | ICD-10-CM | POA: Diagnosis not present

## 2021-05-24 DIAGNOSIS — Z1159 Encounter for screening for other viral diseases: Secondary | ICD-10-CM | POA: Diagnosis not present

## 2021-05-25 ENCOUNTER — Other Ambulatory Visit (HOSPITAL_COMMUNITY): Payer: Self-pay | Admitting: Vascular Surgery

## 2021-05-25 ENCOUNTER — Encounter: Payer: Medicare HMO | Admitting: Vascular Surgery

## 2021-05-25 DIAGNOSIS — E669 Obesity, unspecified: Secondary | ICD-10-CM | POA: Diagnosis not present

## 2021-05-25 DIAGNOSIS — N184 Chronic kidney disease, stage 4 (severe): Secondary | ICD-10-CM | POA: Diagnosis not present

## 2021-05-25 DIAGNOSIS — I770 Arteriovenous fistula, acquired: Secondary | ICD-10-CM

## 2021-05-25 DIAGNOSIS — N186 End stage renal disease: Secondary | ICD-10-CM | POA: Diagnosis not present

## 2021-05-25 DIAGNOSIS — M6281 Muscle weakness (generalized): Secondary | ICD-10-CM | POA: Diagnosis not present

## 2021-05-25 DIAGNOSIS — Z9889 Other specified postprocedural states: Secondary | ICD-10-CM

## 2021-05-25 DIAGNOSIS — Z992 Dependence on renal dialysis: Secondary | ICD-10-CM | POA: Diagnosis not present

## 2021-05-25 DIAGNOSIS — L89513 Pressure ulcer of right ankle, stage 3: Secondary | ICD-10-CM | POA: Diagnosis not present

## 2021-05-26 DIAGNOSIS — Z992 Dependence on renal dialysis: Secondary | ICD-10-CM | POA: Diagnosis not present

## 2021-05-26 DIAGNOSIS — N186 End stage renal disease: Secondary | ICD-10-CM | POA: Diagnosis not present

## 2021-05-28 DIAGNOSIS — N186 End stage renal disease: Secondary | ICD-10-CM | POA: Diagnosis not present

## 2021-05-28 DIAGNOSIS — Z992 Dependence on renal dialysis: Secondary | ICD-10-CM | POA: Diagnosis not present

## 2021-05-31 DIAGNOSIS — L89153 Pressure ulcer of sacral region, stage 3: Secondary | ICD-10-CM | POA: Diagnosis not present

## 2021-05-31 DIAGNOSIS — L89523 Pressure ulcer of left ankle, stage 3: Secondary | ICD-10-CM | POA: Diagnosis not present

## 2021-05-31 DIAGNOSIS — L98492 Non-pressure chronic ulcer of skin of other sites with fat layer exposed: Secondary | ICD-10-CM | POA: Diagnosis not present

## 2021-05-31 DIAGNOSIS — L89520 Pressure ulcer of left ankle, unstageable: Secondary | ICD-10-CM | POA: Diagnosis not present

## 2021-05-31 DIAGNOSIS — L89513 Pressure ulcer of right ankle, stage 3: Secondary | ICD-10-CM | POA: Diagnosis not present

## 2021-05-31 DIAGNOSIS — N186 End stage renal disease: Secondary | ICD-10-CM | POA: Diagnosis not present

## 2021-05-31 DIAGNOSIS — Z992 Dependence on renal dialysis: Secondary | ICD-10-CM | POA: Diagnosis not present

## 2021-06-01 ENCOUNTER — Encounter: Payer: Medicare HMO | Admitting: Vascular Surgery

## 2021-06-01 DIAGNOSIS — L89513 Pressure ulcer of right ankle, stage 3: Secondary | ICD-10-CM | POA: Diagnosis not present

## 2021-06-01 DIAGNOSIS — N184 Chronic kidney disease, stage 4 (severe): Secondary | ICD-10-CM | POA: Diagnosis not present

## 2021-06-01 DIAGNOSIS — E11622 Type 2 diabetes mellitus with other skin ulcer: Secondary | ICD-10-CM | POA: Diagnosis not present

## 2021-06-01 DIAGNOSIS — M6281 Muscle weakness (generalized): Secondary | ICD-10-CM | POA: Diagnosis not present

## 2021-06-02 DIAGNOSIS — Z992 Dependence on renal dialysis: Secondary | ICD-10-CM | POA: Diagnosis not present

## 2021-06-02 DIAGNOSIS — N186 End stage renal disease: Secondary | ICD-10-CM | POA: Diagnosis not present

## 2021-06-04 DIAGNOSIS — N186 End stage renal disease: Secondary | ICD-10-CM | POA: Diagnosis not present

## 2021-06-04 DIAGNOSIS — Z992 Dependence on renal dialysis: Secondary | ICD-10-CM | POA: Diagnosis not present

## 2021-06-07 DIAGNOSIS — N186 End stage renal disease: Secondary | ICD-10-CM | POA: Diagnosis not present

## 2021-06-07 DIAGNOSIS — Z992 Dependence on renal dialysis: Secondary | ICD-10-CM | POA: Diagnosis not present

## 2021-06-08 DIAGNOSIS — M19072 Primary osteoarthritis, left ankle and foot: Secondary | ICD-10-CM | POA: Diagnosis not present

## 2021-06-08 DIAGNOSIS — E11622 Type 2 diabetes mellitus with other skin ulcer: Secondary | ICD-10-CM | POA: Diagnosis not present

## 2021-06-08 DIAGNOSIS — L89513 Pressure ulcer of right ankle, stage 3: Secondary | ICD-10-CM | POA: Diagnosis not present

## 2021-06-08 DIAGNOSIS — N184 Chronic kidney disease, stage 4 (severe): Secondary | ICD-10-CM | POA: Diagnosis not present

## 2021-06-08 DIAGNOSIS — M6281 Muscle weakness (generalized): Secondary | ICD-10-CM | POA: Diagnosis not present

## 2021-06-09 DIAGNOSIS — Z794 Long term (current) use of insulin: Secondary | ICD-10-CM | POA: Diagnosis not present

## 2021-06-09 DIAGNOSIS — I10 Essential (primary) hypertension: Secondary | ICD-10-CM | POA: Diagnosis not present

## 2021-06-09 DIAGNOSIS — E785 Hyperlipidemia, unspecified: Secondary | ICD-10-CM | POA: Diagnosis not present

## 2021-06-09 DIAGNOSIS — I509 Heart failure, unspecified: Secondary | ICD-10-CM | POA: Diagnosis not present

## 2021-06-09 DIAGNOSIS — E1169 Type 2 diabetes mellitus with other specified complication: Secondary | ICD-10-CM | POA: Diagnosis not present

## 2021-06-09 DIAGNOSIS — E539 Vitamin B deficiency, unspecified: Secondary | ICD-10-CM | POA: Diagnosis not present

## 2021-06-09 DIAGNOSIS — Z79899 Other long term (current) drug therapy: Secondary | ICD-10-CM | POA: Diagnosis not present

## 2021-06-09 DIAGNOSIS — I5030 Unspecified diastolic (congestive) heart failure: Secondary | ICD-10-CM | POA: Diagnosis not present

## 2021-06-09 DIAGNOSIS — Z992 Dependence on renal dialysis: Secondary | ICD-10-CM | POA: Diagnosis not present

## 2021-06-09 DIAGNOSIS — N186 End stage renal disease: Secondary | ICD-10-CM | POA: Diagnosis not present

## 2021-06-11 DIAGNOSIS — Z992 Dependence on renal dialysis: Secondary | ICD-10-CM | POA: Diagnosis not present

## 2021-06-11 DIAGNOSIS — N186 End stage renal disease: Secondary | ICD-10-CM | POA: Diagnosis not present

## 2021-06-14 DIAGNOSIS — N186 End stage renal disease: Secondary | ICD-10-CM | POA: Diagnosis not present

## 2021-06-14 DIAGNOSIS — Z992 Dependence on renal dialysis: Secondary | ICD-10-CM | POA: Diagnosis not present

## 2021-06-15 DIAGNOSIS — M6281 Muscle weakness (generalized): Secondary | ICD-10-CM | POA: Diagnosis not present

## 2021-06-15 DIAGNOSIS — E11622 Type 2 diabetes mellitus with other skin ulcer: Secondary | ICD-10-CM | POA: Diagnosis not present

## 2021-06-15 DIAGNOSIS — R69 Illness, unspecified: Secondary | ICD-10-CM | POA: Diagnosis not present

## 2021-06-15 DIAGNOSIS — L89513 Pressure ulcer of right ankle, stage 3: Secondary | ICD-10-CM | POA: Diagnosis not present

## 2021-06-15 DIAGNOSIS — F01A2 Vascular dementia, mild, with psychotic disturbance: Secondary | ICD-10-CM | POA: Diagnosis not present

## 2021-06-15 DIAGNOSIS — E669 Obesity, unspecified: Secondary | ICD-10-CM | POA: Diagnosis not present

## 2021-06-15 DIAGNOSIS — F4323 Adjustment disorder with mixed anxiety and depressed mood: Secondary | ICD-10-CM | POA: Diagnosis not present

## 2021-06-16 DIAGNOSIS — F4323 Adjustment disorder with mixed anxiety and depressed mood: Secondary | ICD-10-CM | POA: Diagnosis not present

## 2021-06-16 DIAGNOSIS — N186 End stage renal disease: Secondary | ICD-10-CM | POA: Diagnosis not present

## 2021-06-16 DIAGNOSIS — Z992 Dependence on renal dialysis: Secondary | ICD-10-CM | POA: Diagnosis not present

## 2021-06-16 DIAGNOSIS — R69 Illness, unspecified: Secondary | ICD-10-CM | POA: Diagnosis not present

## 2021-06-18 DIAGNOSIS — Z992 Dependence on renal dialysis: Secondary | ICD-10-CM | POA: Diagnosis not present

## 2021-06-18 DIAGNOSIS — N186 End stage renal disease: Secondary | ICD-10-CM | POA: Diagnosis not present

## 2021-06-21 DIAGNOSIS — Z794 Long term (current) use of insulin: Secondary | ICD-10-CM | POA: Diagnosis not present

## 2021-06-21 DIAGNOSIS — E119 Type 2 diabetes mellitus without complications: Secondary | ICD-10-CM | POA: Diagnosis not present

## 2021-06-21 DIAGNOSIS — D509 Iron deficiency anemia, unspecified: Secondary | ICD-10-CM | POA: Diagnosis not present

## 2021-06-21 DIAGNOSIS — N186 End stage renal disease: Secondary | ICD-10-CM | POA: Diagnosis not present

## 2021-06-21 DIAGNOSIS — Z1159 Encounter for screening for other viral diseases: Secondary | ICD-10-CM | POA: Diagnosis not present

## 2021-06-21 DIAGNOSIS — Z992 Dependence on renal dialysis: Secondary | ICD-10-CM | POA: Diagnosis not present

## 2021-06-22 ENCOUNTER — Encounter: Payer: Self-pay | Admitting: Vascular Surgery

## 2021-06-22 ENCOUNTER — Ambulatory Visit (INDEPENDENT_AMBULATORY_CARE_PROVIDER_SITE_OTHER): Payer: Medicare HMO | Admitting: Vascular Surgery

## 2021-06-22 ENCOUNTER — Ambulatory Visit (INDEPENDENT_AMBULATORY_CARE_PROVIDER_SITE_OTHER): Payer: Medicare HMO

## 2021-06-22 ENCOUNTER — Other Ambulatory Visit: Payer: Self-pay

## 2021-06-22 VITALS — BP 124/76 | HR 79 | Temp 98.4°F | Wt 220.0 lb

## 2021-06-22 DIAGNOSIS — N186 End stage renal disease: Secondary | ICD-10-CM

## 2021-06-22 DIAGNOSIS — M6281 Muscle weakness (generalized): Secondary | ICD-10-CM | POA: Diagnosis not present

## 2021-06-22 DIAGNOSIS — Z992 Dependence on renal dialysis: Secondary | ICD-10-CM

## 2021-06-22 DIAGNOSIS — I770 Arteriovenous fistula, acquired: Secondary | ICD-10-CM | POA: Diagnosis not present

## 2021-06-22 DIAGNOSIS — E11622 Type 2 diabetes mellitus with other skin ulcer: Secondary | ICD-10-CM | POA: Diagnosis not present

## 2021-06-22 DIAGNOSIS — L89513 Pressure ulcer of right ankle, stage 3: Secondary | ICD-10-CM | POA: Diagnosis not present

## 2021-06-22 DIAGNOSIS — E669 Obesity, unspecified: Secondary | ICD-10-CM | POA: Diagnosis not present

## 2021-06-22 DIAGNOSIS — Z9889 Other specified postprocedural states: Secondary | ICD-10-CM | POA: Diagnosis not present

## 2021-06-22 NOTE — H&P (View-Only) (Signed)
[facility]  Patient name: Danny [name] MRN: [id number] DOB: 03/01/1956 Sex: male  REASON FOR VISIT: Follow-up left brachiocephalic AV fistula creation on 04/26/2021  HPI: Danny [name] is a 65 y.o. male here today for follow-up.  Has had no steal symptoms.  Reported some swelling initially around the time of surgery but this has resolved.  Current Outpatient Medications  Medication Sig Dispense Refill   acetaminophen (TYLENOL) 500 MG tablet Take 1,000 mg by mouth every 6 (six) hours as needed for moderate pain or headache.     albuterol (ACCUNEB) 0.63 MG/3ML nebulizer solution Take by nebulization.     apixaban (ELIQUIS) 5 MG TABS tablet Take 1 tablet (5 mg total) by mouth 2 (two) times daily. 60 tablet    Ascorbic Acid (VITAMIN C) 500 MG CHEW Chew 1,000 mg by mouth daily.     cholecalciferol (VITAMIN D3) 25 MCG (1000 UNIT) tablet Take by mouth daily.     collagenase (SANTYL) ointment Apply to right lateral malleolus once daily; apply in a 1/8 inch layer and top with saline moistened gauze 2X2.  0   docusate sodium (COLACE) 100 MG capsule Take 100 mg by mouth 2 (two) times daily.     ferrous sulfate 325 (65 FE) MG tablet Take 325 mg by mouth 2 (two) times daily with a meal.     Insulin NPH Isophane & Regular (HUMULIN 70/30 KWIKPEN [place]) Inject 5 Units into the skin 2 (two) times daily.     Lactulose 20 GM/30ML SOLN Take 30 mLs (20 g total) by mouth daily. (Patient taking differently: Take 30 mLs by mouth 2 (two) times daily.) 450 mL 1   metoprolol succinate (TOPROL-XL) 25 MG 24 hr tablet Take 1 tablet (25 mg total) by mouth in the morning and at bedtime.     Multiple Vitamin (MULTIVITAMIN WITH MINERALS) TABS tablet Take 1 tablet by mouth daily.     Nutritional Supplements (FEEDING SUPPLEMENT, NEPRO CARB STEADY,) LIQD Take 237 mLs by mouth 2 (two) times daily between meals.  0   Omega-3 Fatty Acids (FISH OIL PO) Take 2,400 mg by  mouth 2 (two) times daily.     ondansetron (ZOFRAN) 4 MG tablet Take 4 mg by mouth every 6 (six) hours. Prn. (Patient not taking: Reported on 05/16/2021)     oxycodone (OXY-IR) 5 MG capsule Take 5 mg by mouth every 6 (six) hours as needed.     polyethylene glycol (MIRALAX / GLYCOLAX) 17 g packet Take 17 g by mouth daily. 14 each 0   polyethylene glycol-electrolytes (TRILYTE) 420 g solution Take 4,000 mLs by mouth as directed. 4000 mL 0   rosuvastatin (CRESTOR) 5 MG tablet Take 5 mg by mouth daily.     senna (SENOKOT) 8.6 MG tablet Take 1 tablet by mouth 2 (two) times daily.     sevelamer carbonate (RENVELA) 800 MG tablet Take 1 tablet (800 mg total) by mouth 2 (two) times daily with a meal.     sodium phosphate Pediatric (FLEET) 3.5-9.5 GM/59ML enema Place 1 enema rectally once as needed for severe constipation.     tamsulosin (FLOMAX) 0.4 MG CAPS capsule Take 1 capsule (0.4 mg total) by mouth daily after supper. 30 capsule 11   vitamin B-12 (CYANOCOBALAMIN) 1000 MCG tablet Take 1,000 mcg by mouth daily.     vitamin E 1000 UNIT capsule Take 1,000 Units by mouth daily.     XARELTO 20 MG TABS tablet Take 20 mg  by mouth daily.     Zinc 30 MG CAPS Take 1 capsule by mouth daily.     No current facility-administered medications for this visit.     PHYSICAL EXAM: Vitals:   06/22/21 1341  BP: 124/76  Pulse: 79  Temp: 98.4 F (36.9 C)  TempSrc: Temporal  SpO2: 98%  Weight: 220 lb (99.8 kg)    GENERAL: The patient is a well-nourished male, in no acute distress. The vital signs are documented above. Excellent thrill in his left upper arm AV fistula.  Palpable left radial pulse.  The vein does run deep to the fat by physical exam  Duplex reveals good flow through his fistula with the size and the 0.7 to 1 cm range.  His fistula does run somewhat deep to the fat  MEDICAL ISSUES: Stable status post left brachiocephalic fistula creation on 04/26/2021.  I do feel that he will have a difficult  time with access due to the deep location of his cephalic vein in his upper arm.  I have recommended superficial mobilization of the vein to increase his likelihood of having successful hemodialysis.  We will coordinate that for this coming Tuesday as an outpatient at [facility].  We will hold his Eliquis prior to surgery and also will change his hemodialysis date from Tuesday as well.   Rosetta Posner, MD FACS [facility] [phone]  Note: Portions of this report may have been transcribed using voice recognition software.  Every effort has been made to ensure accuracy; however, inadvertent computerized transcription errors may still be present.

## 2021-06-22 NOTE — Progress Notes (Signed)
Vascular and Vein Specialist of [place]  Patient name: Danny [name] MRN: [id number] DOB: 07-Jul-1955 Sex: male  REASON FOR VISIT: Follow-up left brachiocephalic AV fistula creation on 04/26/2021  HPI: Danny [name] is a 65 y.o. male here today for follow-up.  Has had no steal symptoms.  Reported some swelling initially around the time of surgery but this has resolved.  Current Outpatient Medications  Medication Sig Dispense Refill   acetaminophen (TYLENOL) 500 MG tablet Take 1,000 mg by mouth every 6 (six) hours as needed for moderate pain or headache.     albuterol (ACCUNEB) 0.63 MG/3ML nebulizer solution Take by nebulization.     apixaban (ELIQUIS) 5 MG TABS tablet Take 1 tablet (5 mg total) by mouth 2 (two) times daily. 60 tablet    Ascorbic Acid (VITAMIN C) 500 MG CHEW Chew 1,000 mg by mouth daily.     cholecalciferol (VITAMIN D3) 25 MCG (1000 UNIT) tablet Take by mouth daily.     collagenase (SANTYL) ointment Apply to right lateral malleolus once daily; apply in a 1/8 inch layer and top with saline moistened gauze 2X2.  0   docusate sodium (COLACE) 100 MG capsule Take 100 mg by mouth 2 (two) times daily.     ferrous sulfate 325 (65 FE) MG tablet Take 325 mg by mouth 2 (two) times daily with a meal.     Insulin NPH Isophane & Regular (HUMULIN 70/30 KWIKPEN [place]) Inject 5 Units into the skin 2 (two) times daily.     Lactulose 20 GM/30ML SOLN Take 30 mLs (20 g total) by mouth daily. (Patient taking differently: Take 30 mLs by mouth 2 (two) times daily.) 450 mL 1   metoprolol succinate (TOPROL-XL) 25 MG 24 hr tablet Take 1 tablet (25 mg total) by mouth in the morning and at bedtime.     Multiple Vitamin (MULTIVITAMIN WITH MINERALS) TABS tablet Take 1 tablet by mouth daily.     Nutritional Supplements (FEEDING SUPPLEMENT, NEPRO CARB STEADY,) LIQD Take 237 mLs by mouth 2 (two) times daily between meals.  0   Omega-3 Fatty Acids (FISH OIL PO) Take 2,400 mg by  mouth 2 (two) times daily.     ondansetron (ZOFRAN) 4 MG tablet Take 4 mg by mouth every 6 (six) hours. Prn. (Patient not taking: Reported on 05/16/2021)     oxycodone (OXY-IR) 5 MG capsule Take 5 mg by mouth every 6 (six) hours as needed.     polyethylene glycol (MIRALAX / GLYCOLAX) 17 g packet Take 17 g by mouth daily. 14 each 0   polyethylene glycol-electrolytes (TRILYTE) 420 g solution Take 4,000 mLs by mouth as directed. 4000 mL 0   rosuvastatin (CRESTOR) 5 MG tablet Take 5 mg by mouth daily.     senna (SENOKOT) 8.6 MG tablet Take 1 tablet by mouth 2 (two) times daily.     sevelamer carbonate (RENVELA) 800 MG tablet Take 1 tablet (800 mg total) by mouth 2 (two) times daily with a meal.     sodium phosphate Pediatric (FLEET) 3.5-9.5 GM/59ML enema Place 1 enema rectally once as needed for severe constipation.     tamsulosin (FLOMAX) 0.4 MG CAPS capsule Take 1 capsule (0.4 mg total) by mouth daily after supper. 30 capsule 11   vitamin B-12 (CYANOCOBALAMIN) 1000 MCG tablet Take 1,000 mcg by mouth daily.     vitamin E 1000 UNIT capsule Take 1,000 Units by mouth daily.     XARELTO 20 MG TABS tablet Take 20 mg  by mouth daily.     Zinc 30 MG CAPS Take 1 capsule by mouth daily.     No current facility-administered medications for this visit.     PHYSICAL EXAM: Vitals:   06/22/21 1341  BP: 124/76  Pulse: 79  Temp: 98.4 F (36.9 C)  TempSrc: Temporal  SpO2: 98%  Weight: 220 lb (99.8 kg)    GENERAL: The patient is a well-nourished male, in no acute distress. The vital signs are documented above. Excellent thrill in his left upper arm AV fistula.  Palpable left radial pulse.  The vein does run deep to the fat by physical exam  Duplex reveals good flow through his fistula with the size and the 0.7 to 1 cm range.  His fistula does run somewhat deep to the fat  MEDICAL ISSUES: Stable status post left brachiocephalic fistula creation on 04/26/2021.  I do feel that he will have a difficult  time with access due to the deep location of his cephalic vein in his upper arm.  I have recommended superficial mobilization of the vein to increase his likelihood of having successful hemodialysis.  We will coordinate that for this coming Tuesday as an outpatient at [facility].  We will hold his Eliquis prior to surgery and also will change his hemodialysis date from Tuesday as well.   Rosetta Posner, MD FACS [facility] Ve[facility] Specialists of [facility] [phone]  Note: Portions of this report may have been transcribed using voice recognition software.  Every effort has been made to ensure accuracy; however, inadvertent computerized transcription errors may still be present.

## 2021-06-23 ENCOUNTER — Encounter (HOSPITAL_COMMUNITY)
Admission: RE | Admit: 2021-06-23 | Discharge: 2021-06-23 | Disposition: A | Discharge status: Home / Self Care | Payer: Medicare HMO | Source: Ambulatory Visit | Attending: Vascular Surgery | Admitting: Vascular Surgery

## 2021-06-23 ENCOUNTER — Other Ambulatory Visit: Payer: Self-pay

## 2021-06-23 ENCOUNTER — Encounter (HOSPITAL_COMMUNITY): Payer: Self-pay

## 2021-06-23 DIAGNOSIS — F4323 Adjustment disorder with mixed anxiety and depressed mood: Secondary | ICD-10-CM | POA: Diagnosis not present

## 2021-06-23 DIAGNOSIS — Z992 Dependence on renal dialysis: Secondary | ICD-10-CM | POA: Diagnosis not present

## 2021-06-23 DIAGNOSIS — R69 Illness, unspecified: Secondary | ICD-10-CM | POA: Diagnosis not present

## 2021-06-23 DIAGNOSIS — N186 End stage renal disease: Secondary | ICD-10-CM | POA: Diagnosis not present

## 2021-06-23 NOTE — Pre-Procedure Instructions (Signed)
Patient is currently a resident at [facility] [phone]. I spoke with him about his medical history and went over preop instructions with him. I also called his nurse Izora Gala and went over all this information as well. Both Izora Gala and patient are aware that his last dose of eliquis should be taken on 06/25/2021.

## 2021-06-24 ENCOUNTER — Other Ambulatory Visit: Payer: Self-pay

## 2021-06-24 NOTE — Progress Notes (Signed)
error 

## 2021-06-25 DIAGNOSIS — Z992 Dependence on renal dialysis: Secondary | ICD-10-CM | POA: Diagnosis not present

## 2021-06-25 DIAGNOSIS — N186 End stage renal disease: Secondary | ICD-10-CM | POA: Diagnosis not present

## 2021-06-28 DIAGNOSIS — D631 Anemia in chronic kidney disease: Secondary | ICD-10-CM | POA: Diagnosis not present

## 2021-06-28 DIAGNOSIS — Z992 Dependence on renal dialysis: Secondary | ICD-10-CM | POA: Diagnosis not present

## 2021-06-28 DIAGNOSIS — N186 End stage renal disease: Secondary | ICD-10-CM | POA: Diagnosis not present

## 2021-06-29 ENCOUNTER — Telehealth: Payer: Self-pay

## 2021-06-29 DIAGNOSIS — L89513 Pressure ulcer of right ankle, stage 3: Secondary | ICD-10-CM | POA: Diagnosis not present

## 2021-06-29 DIAGNOSIS — L89523 Pressure ulcer of left ankle, stage 3: Secondary | ICD-10-CM | POA: Diagnosis not present

## 2021-06-29 DIAGNOSIS — L98492 Non-pressure chronic ulcer of skin of other sites with fat layer exposed: Secondary | ICD-10-CM | POA: Diagnosis not present

## 2021-06-29 DIAGNOSIS — L89153 Pressure ulcer of sacral region, stage 3: Secondary | ICD-10-CM | POA: Diagnosis not present

## 2021-06-29 NOTE — Telephone Encounter (Signed)
Telephone call received from [facility] at [facility] and [place] that patient cancelled surgery scheduled on Jan 3 because he was exposed to Covid. He has so far tested negative and would like to reschedule. Rescheduled patient for next available date for provider on Jan 17. Instructions refaxed to facility at [phone].

## 2021-06-30 DIAGNOSIS — N186 End stage renal disease: Secondary | ICD-10-CM | POA: Diagnosis not present

## 2021-06-30 DIAGNOSIS — Z992 Dependence on renal dialysis: Secondary | ICD-10-CM | POA: Diagnosis not present

## 2021-06-30 DIAGNOSIS — D631 Anemia in chronic kidney disease: Secondary | ICD-10-CM | POA: Diagnosis not present

## 2021-06-30 DIAGNOSIS — N2581 Secondary hyperparathyroidism of renal origin: Secondary | ICD-10-CM | POA: Diagnosis not present

## 2021-07-04 DIAGNOSIS — D631 Anemia in chronic kidney disease: Secondary | ICD-10-CM | POA: Diagnosis not present

## 2021-07-04 DIAGNOSIS — N186 End stage renal disease: Secondary | ICD-10-CM | POA: Diagnosis not present

## 2021-07-04 DIAGNOSIS — Z992 Dependence on renal dialysis: Secondary | ICD-10-CM | POA: Diagnosis not present

## 2021-07-06 DIAGNOSIS — D631 Anemia in chronic kidney disease: Secondary | ICD-10-CM | POA: Diagnosis not present

## 2021-07-06 DIAGNOSIS — N186 End stage renal disease: Secondary | ICD-10-CM | POA: Diagnosis not present

## 2021-07-06 DIAGNOSIS — Z992 Dependence on renal dialysis: Secondary | ICD-10-CM | POA: Diagnosis not present

## 2021-07-08 ENCOUNTER — Other Ambulatory Visit: Payer: Self-pay

## 2021-07-08 ENCOUNTER — Encounter (HOSPITAL_COMMUNITY)
Admission: RE | Admit: 2021-07-08 | Discharge: 2021-07-08 | Disposition: A | Discharge status: Home / Self Care | Payer: Medicare Other | Source: Ambulatory Visit | Attending: Vascular Surgery | Admitting: Vascular Surgery

## 2021-07-08 NOTE — Pre-Procedure Instructions (Signed)
Spoke with Danny [name] at [facility]. She is aware to stop eliquis, last dose to be 07/09/2021. Has no other questions about pre-op instructions. Went over these verbally with her and she voiced understanding.

## 2021-07-11 DIAGNOSIS — Z992 Dependence on renal dialysis: Secondary | ICD-10-CM | POA: Diagnosis not present

## 2021-07-11 DIAGNOSIS — D631 Anemia in chronic kidney disease: Secondary | ICD-10-CM | POA: Diagnosis not present

## 2021-07-11 DIAGNOSIS — N186 End stage renal disease: Secondary | ICD-10-CM | POA: Diagnosis not present

## 2021-07-12 ENCOUNTER — Ambulatory Visit (HOSPITAL_COMMUNITY)
Admission: RE | Admit: 2021-07-12 | Discharge: 2021-07-12 | Disposition: A | Discharge status: Home / Self Care | Payer: Medicare Other | Source: Ambulatory Visit | Attending: Vascular Surgery | Admitting: Vascular Surgery

## 2021-07-12 ENCOUNTER — Ambulatory Visit (HOSPITAL_COMMUNITY): Payer: Medicare Other | Admitting: Anesthesiology

## 2021-07-12 ENCOUNTER — Encounter (HOSPITAL_COMMUNITY): Payer: Self-pay | Admitting: Vascular Surgery

## 2021-07-12 ENCOUNTER — Encounter (HOSPITAL_COMMUNITY)
Admission: RE | Disposition: A | Discharge status: Home / Self Care | Payer: Self-pay | Source: Ambulatory Visit | Attending: Vascular Surgery

## 2021-07-12 DIAGNOSIS — Z992 Dependence on renal dialysis: Secondary | ICD-10-CM | POA: Diagnosis not present

## 2021-07-12 DIAGNOSIS — N186 End stage renal disease: Secondary | ICD-10-CM | POA: Insufficient documentation

## 2021-07-12 DIAGNOSIS — R0609 Other forms of dyspnea: Secondary | ICD-10-CM | POA: Insufficient documentation

## 2021-07-12 DIAGNOSIS — I509 Heart failure, unspecified: Secondary | ICD-10-CM | POA: Insufficient documentation

## 2021-07-12 DIAGNOSIS — Z87891 Personal history of nicotine dependence: Secondary | ICD-10-CM | POA: Insufficient documentation

## 2021-07-12 DIAGNOSIS — I132 Hypertensive heart and chronic kidney disease with heart failure and with stage 5 chronic kidney disease, or end stage renal disease: Secondary | ICD-10-CM | POA: Insufficient documentation

## 2021-07-12 DIAGNOSIS — I120 Hypertensive chronic kidney disease with stage 5 chronic kidney disease or end stage renal disease: Secondary | ICD-10-CM | POA: Diagnosis not present

## 2021-07-12 HISTORY — PX: FISTULA SUPERFICIALIZATION: SHX6341

## 2021-07-12 LAB — POCT I-STAT, CHEM 8
BUN: 44 mg/dL — ABNORMAL HIGH (ref 8–23)
Calcium, Ion: 1.06 mmol/L — ABNORMAL LOW (ref 1.15–1.40)
Chloride: 100 mmol/L (ref 98–111)
Creatinine, Ser: 3.4 mg/dL — ABNORMAL HIGH (ref 0.61–1.24)
Glucose, Bld: 109 mg/dL — ABNORMAL HIGH (ref 70–99)
HCT: 22 % — ABNORMAL LOW (ref 39.0–52.0)
Hemoglobin: 7.5 g/dL — ABNORMAL LOW (ref 13.0–17.0)
Potassium: 3.9 mmol/L (ref 3.5–5.1)
Sodium: 136 mmol/L (ref 135–145)
TCO2: 27 mmol/L (ref 22–32)

## 2021-07-12 SURGERY — FISTULA SUPERFICIALIZATION
Anesthesia: General | Site: Arm Upper | Laterality: Left

## 2021-07-12 MED ORDER — FENTANYL CITRATE (PF) 100 MCG/2ML IJ SOLN
INTRAMUSCULAR | Status: AC
  Filled 2021-07-12: qty 2

## 2021-07-12 MED ORDER — HEPARIN 6000 UNIT IRRIGATION SOLUTION
Status: DC | PRN
  Administered 2021-07-12: 1

## 2021-07-12 MED ORDER — PHENYLEPHRINE HCL (PRESSORS) 10 MG/ML IV SOLN
INTRAVENOUS | Status: AC
  Filled 2021-07-12: qty 1

## 2021-07-12 MED ORDER — PROPOFOL 500 MG/50ML IV EMUL
INTRAVENOUS | Status: DC | PRN
  Administered 2021-07-12: 25 ug/kg/min via INTRAVENOUS

## 2021-07-12 MED ORDER — HYDROMORPHONE HCL 1 MG/ML IJ SOLN
0.2500 mg | INTRAMUSCULAR | Status: DC | PRN
  Administered 2021-07-12: 0.5 mg via INTRAVENOUS
  Filled 2021-07-12: qty 0.5

## 2021-07-12 MED ORDER — 0.9 % SODIUM CHLORIDE (POUR BTL) OPTIME
TOPICAL | Status: DC | PRN
  Administered 2021-07-12: 1000 mL

## 2021-07-12 MED ORDER — LIDOCAINE-EPINEPHRINE (PF) 1 %-1:200000 IJ SOLN
INTRAMUSCULAR | Status: DC | PRN
  Administered 2021-07-12: 10 mL

## 2021-07-12 MED ORDER — CHLORHEXIDINE GLUCONATE 4 % EX LIQD: 60.0000 mL | Freq: Once | CUTANEOUS | Status: DC

## 2021-07-12 MED ORDER — CHLORHEXIDINE GLUCONATE 0.12 % MT SOLN
15.0000 mL | Freq: Once | OROMUCOSAL | Status: AC
  Administered 2021-07-12: 15 mL via OROMUCOSAL

## 2021-07-12 MED ORDER — SODIUM CHLORIDE 0.9 % IV SOLN
INTRAVENOUS | Status: DC
  Administered 2021-07-12: 20 mL via INTRAVENOUS

## 2021-07-12 MED ORDER — SODIUM CHLORIDE 0.9 % IV SOLN: INTRAVENOUS | Status: DC

## 2021-07-12 MED ORDER — ONDANSETRON HCL 4 MG/2ML IJ SOLN
INTRAMUSCULAR | Status: AC
  Filled 2021-07-12: qty 2

## 2021-07-12 MED ORDER — HEPARIN SODIUM (PORCINE) 1000 UNIT/ML IJ SOLN
INTRAMUSCULAR | Status: AC
  Filled 2021-07-12: qty 5

## 2021-07-12 MED ORDER — PROPOFOL 10 MG/ML IV BOLUS
INTRAVENOUS | Status: DC | PRN
Start: 2021-07-12 — End: 2021-07-12
  Administered 2021-07-12: 30 mg via INTRAVENOUS

## 2021-07-12 MED ORDER — VANCOMYCIN HCL IN DEXTROSE 1-5 GM/200ML-% IV SOLN
1000.0000 mg | INTRAVENOUS | Status: AC
  Administered 2021-07-12: 1000 mg via INTRAVENOUS

## 2021-07-12 MED ORDER — ORAL CARE MOUTH RINSE: 15.0000 mL | Freq: Once | OROMUCOSAL | Status: AC

## 2021-07-12 MED ORDER — VANCOMYCIN HCL IN DEXTROSE 1-5 GM/200ML-% IV SOLN
INTRAVENOUS | Status: AC
  Filled 2021-07-12: qty 200

## 2021-07-12 MED ORDER — PROTAMINE SULFATE 10 MG/ML IV SOLN
INTRAVENOUS | Status: AC
  Filled 2021-07-12: qty 5

## 2021-07-12 MED ORDER — LIDOCAINE-EPINEPHRINE (PF) 1 %-1:200000 IJ SOLN
INTRAMUSCULAR | Status: AC
  Filled 2021-07-12: qty 30

## 2021-07-12 MED ORDER — PROPOFOL 1000 MG/100ML IV EMUL
INTRAVENOUS | Status: AC
  Filled 2021-07-12: qty 100

## 2021-07-12 MED ORDER — ONDANSETRON HCL 4 MG/2ML IJ SOLN
INTRAMUSCULAR | Status: DC | PRN
Start: 2021-07-12 — End: 2021-07-12
  Administered 2021-07-12: 4 mg via INTRAVENOUS

## 2021-07-12 MED ORDER — CHLORHEXIDINE GLUCONATE 0.12 % MT SOLN
OROMUCOSAL | Status: AC
  Filled 2021-07-12: qty 15

## 2021-07-12 MED ORDER — ONDANSETRON HCL 4 MG/2ML IJ SOLN: 4.0000 mg | Freq: Once | INTRAMUSCULAR | Status: DC | PRN

## 2021-07-12 MED ORDER — PROPOFOL 500 MG/50ML IV EMUL
INTRAVENOUS | Status: AC
  Filled 2021-07-12: qty 50

## 2021-07-12 SURGICAL SUPPLY — 36 items
ARMBAND PINK RESTRICT EXTREMIT (MISCELLANEOUS) ×2 IMPLANT
BAG HAMPER (MISCELLANEOUS) ×4 IMPLANT
BNDG ELASTIC 4X5.8 VLCR STR LF (GAUZE/BANDAGES/DRESSINGS) ×2 IMPLANT
CANNULA VESSEL 3MM 2 BLNT TIP (CANNULA) ×2 IMPLANT
CLIP LIGATING EXTRA MED SLVR (CLIP) ×2 IMPLANT
CLIP LIGATING EXTRA SM BLUE (MISCELLANEOUS) ×2 IMPLANT
COVER LIGHT HANDLE STERIS (MISCELLANEOUS) ×3 IMPLANT
COVER MAYO STAND XLG (MISCELLANEOUS) ×2 IMPLANT
COVER PROBE W GEL 5X96 (DRAPES) ×2 IMPLANT
DECANTER SPIKE VIAL GLASS SM (MISCELLANEOUS) ×2 IMPLANT
DERMABOND ADVANCED (GAUZE/BANDAGES/DRESSINGS) ×1
DERMABOND ADVANCED .7 DNX12 (GAUZE/BANDAGES/DRESSINGS) ×1 IMPLANT
ELECT REM PT RETURN 9FT ADLT (ELECTROSURGICAL) ×2
ELECTRODE REM PT RTRN 9FT ADLT (ELECTROSURGICAL) ×1 IMPLANT
GAUZE SPONGE 4X4 12PLY STRL (GAUZE/BANDAGES/DRESSINGS) ×2 IMPLANT
GLOVE SURG MICRO LTX SZ7.5 (GLOVE) ×2 IMPLANT
GLOVE SURG UNDER POLY LF SZ7 (GLOVE) ×6 IMPLANT
GOWN STRL REUS W/TWL LRG LVL3 (GOWN DISPOSABLE) ×6 IMPLANT
IV NS 500ML (IV SOLUTION) ×1
IV NS 500ML BAXH (IV SOLUTION) IMPLANT
KIT BLADEGUARD II DBL (SET/KITS/TRAYS/PACK) ×2 IMPLANT
KIT TURNOVER KIT A (KITS) ×2 IMPLANT
MANIFOLD NEPTUNE II (INSTRUMENTS) ×4 IMPLANT
MARKER SKIN DUAL TIP RULER LAB (MISCELLANEOUS) ×3 IMPLANT
NDL HYPO 18GX1.5 BLUNT FILL (NEEDLE) ×1 IMPLANT
NEEDLE HYPO 18GX1.5 BLUNT FILL (NEEDLE) ×2 IMPLANT
NS IRRIG 1000ML POUR BTL (IV SOLUTION) ×2 IMPLANT
PACK CV ACCESS (CUSTOM PROCEDURE TRAY) ×2 IMPLANT
PAD ARMBOARD 7.5X6 YLW CONV (MISCELLANEOUS) ×2 IMPLANT
SET BASIN LINEN APH (SET/KITS/TRAYS/PACK) ×2 IMPLANT
SUT PROLENE 6 0 CC (SUTURE) ×1 IMPLANT
SUT VIC AB 3-0 SH 27 (SUTURE) ×2
SUT VIC AB 3-0 SH 27X BRD (SUTURE) ×1 IMPLANT
SYR 20ML LL LF (SYRINGE) ×2 IMPLANT
SYR CONTROL 10ML LL (SYRINGE) ×2 IMPLANT
WATER STERILE IRR 1000ML POUR (IV SOLUTION) ×1 IMPLANT

## 2021-07-12 NOTE — Progress Notes (Signed)
[facility] transport called will be to [facility] after 1330. Patient given snack, drink and remote to TV while waiting

## 2021-07-12 NOTE — Anesthesia Postprocedure Evaluation (Signed)
Anesthesia Post Note  Patient: Danny [name]  Procedure(s) Performed: LEFT ARM ARTERIOVENOUS FISTULA SUPERFICIALIZATION (Left: Arm Upper)  Patient location during evaluation: PACU Anesthesia Type: General Level of consciousness: awake and alert Pain management: pain level controlled Vital Signs Assessment: post-procedure vital signs reviewed and stable Respiratory status: spontaneous breathing, nonlabored ventilation, respiratory function stable and patient connected to nasal cannula oxygen Cardiovascular status: blood pressure returned to baseline and stable Postop Assessment: no apparent nausea or vomiting Anesthetic complications: no   No notable events documented.   Last Vitals:  Vitals:   07/12/21 1200 07/12/21 1237  BP: 123/68 110/62  Pulse: 84 84  Resp: 16   Temp:  36.5 C  SpO2: 95% 95%    Last Pain:  Vitals:   07/12/21 1237  TempSrc: Oral  PainSc:                  Trixie Rude

## 2021-07-12 NOTE — Op Note (Signed)
° ° °  OPERATIVE REPORT  DATE OF SURGERY: 07/12/2021  PATIENT: Danny [name], 66 y.o. male MRN: [id number]  DOB: 07-Mar-1956  PRE-OPERATIVE DIAGNOSIS: End-stage renal disease  POST-OPERATIVE DIAGNOSIS:  Same  PROCEDURE: Superficial mobilization left upper arm AV fistula and ligation competing branches  SURGEON:  Curt Jews, M.D.  PHYSICIAN ASSISTANT: Vevelyn Royals, RN  The assistant was needed for exposure and to expedite the case  ANESTHESIA: MAC  EBL: per anesthesia record  Total I/O In: 300 [I.V.:100; IV Piggyback:200] Out: 10 [Blood:10]  BLOOD ADMINISTERED: none  DRAINS: none  SPECIMEN: none  COUNTS CORRECT:  YES  PATIENT DISPOSITION:  PACU - hemodynamically stable  PROCEDURE DETAILS: Patient was taken operating placed to position right.  The brachiocephalic vein was imaged with ultrasound and patient had several large tributary branches and these were marked on the surface of the skin.  Using local anesthesia 2 incisions were made over the fistula in the upper arm.  He cephalic vein fistula was mobilized circumferentially from just above the antecubital space to the shoulder.  3 large tributary branches were ligated with 3-0 silk ties and divided.  The wounds were irrigated with saline.  The fat and fascia that were over the vein were closed in a running fashion with 3-0 Vicryl suture below the level of the cephalic vein.  The skin was closed with a 3-0 Vicryl subcuticular stitch.  Sterile dressing was applied.  Dermabond was applied and the patient was transferred to the recovery room in stable condition   Rosetta Posner, M.D., [facility] 07/12/2021 11:31 AM  Note: Portions of this report may have been transcribed using voice recognition software.  Every effort has been made to ensure accuracy; however, inadvertent computerized transcription errors may still be present.

## 2021-07-12 NOTE — Anesthesia Preprocedure Evaluation (Addendum)
Anesthesia Evaluation  Patient identified by MRN, date of birth, ID band Patient awake    Reviewed: Allergy & Precautions, H&P , NPO status , Patient's Chart, lab work & pertinent test results, reviewed documented beta blocker date and time   Airway Mallampati: II  TM Distance: >3 FB Neck ROM: full    Dental no notable dental hx.    Pulmonary neg pulmonary ROS, former smoker,    Pulmonary exam normal breath sounds clear to auscultation       Cardiovascular Exercise Tolerance: Good hypertension, +CHF (hx) and + DOE   Rhythm:regular Rate:Normal     Neuro/Psych negative neurological ROS  negative psych ROS   GI/Hepatic negative GI ROS, Neg liver ROS,   Endo/Other  negative endocrine ROSdiabetes  Renal/GU CRF and ESRFRenal disease  negative genitourinary   Musculoskeletal  (+) Arthritis , Osteoarthritis,    Abdominal   Peds  Hematology negative hematology ROS (+) anemia ,   Anesthesia Other Findings   Reproductive/Obstetrics negative OB ROS                             Anesthesia Physical  Anesthesia Plan  ASA: 3  Anesthesia Plan: General   Post-op Pain Management:    Induction:   PONV Risk Score and Plan: Propofol infusion  Airway Management Planned: Nasal Cannula and Natural Airway  Additional Equipment:   Intra-op Plan:   Post-operative Plan:   Informed Consent: I have reviewed the patients History and Physical, chart, labs and discussed the procedure including the risks, benefits and alternatives for the proposed anesthesia with the patient or authorized representative who has indicated his/her understanding and acceptance.     Dental Advisory Given  Plan Discussed with: CRNA  Anesthesia Plan Comments:        Anesthesia Quick Evaluation

## 2021-07-12 NOTE — Interval H&P Note (Signed)
History and Physical Interval Note:  07/12/2021 9:51 AM  Danny [name]  has presented today for surgery, with the diagnosis of ESRD.  The various methods of treatment have been discussed with the patient and family. After consideration of risks, benefits and other options for treatment, the patient has consented to  Procedure(s): LEFT ARM ARTERIOVENOUS FISTULA SUPERFICIALIZATION (Left) as a surgical intervention.  The patient's history has been reviewed, patient examined, no change in status, stable for surgery.  I have reviewed the patient's chart and labs.  Questions were answered to the patient's satisfaction.     Curt Jews

## 2021-07-12 NOTE — Transfer of Care (Signed)
Immediate Anesthesia Transfer of Care Note  Patient: Danny [name]  Procedure(s) Performed: LEFT ARM ARTERIOVENOUS FISTULA SUPERFICIALIZATION (Left: Arm Upper)  Patient Location: PACU  Anesthesia Type:General  Level of Consciousness: awake, alert  and oriented  Airway & Oxygen Therapy: Patient Spontanous Breathing  Post-op Assessment: Report given to RN, Post -op Vital signs reviewed and stable, Patient moving all extremities X 4 and Patient able to stick tongue midline  Post vital signs: Reviewed  Last Vitals:  Vitals Value Taken Time  BP 105/48   Temp 97.9   Pulse 84   Resp 18 07/12/21 1134  SpO2 100   Vitals shown include unvalidated device data.  Last Pain:  Vitals:   07/12/21 0928  TempSrc: Oral  PainSc: 0-No pain         Complications: No notable events documented.

## 2021-07-12 NOTE — Discharge Instructions (Signed)
Vascular and Vein Specialists of [facility]  Discharge Instructions  AV Fistula or Graft Surgery for Dialysis Access  Please refer to the following instructions for your post-procedure care. Your surgeon or physician assistant will discuss any changes with you.  Activity  You may drive the day following your surgery, if you are comfortable and no longer taking prescription pain medication. Resume full activity as the soreness in your incision resolves.  Bathing/Showering  You may shower after you go home. Keep your incision dry for 48 hours. Do not soak in a bathtub, hot tub, or swim until the incision heals completely. You may not shower if you have a hemodialysis catheter.  Incision Care  Clean your incision with mild soap and water after 48 hours. Pat the area dry with a clean towel. You do not need a bandage unless otherwise instructed. Do not apply any ointments or creams to your incision. You may have skin glue on your incision. Do not peel it off. It will come off on its own in about one week. Your arm may swell a bit after surgery. To reduce swelling use pillows to elevate your arm so it is above your heart. Your doctor will tell you if you need to lightly wrap your arm with an ACE bandage.  Diet  Resume your normal diet. There are not special food restrictions following this procedure. In order to heal from your surgery, it is CRITICAL to get adequate nutrition. Your body requires vitamins, minerals, and protein. Vegetables are the best source of vitamins and minerals. Vegetables also provide the perfect balance of protein. Processed food has little nutritional value, so try to avoid this.  Medications  Resume taking all of your medications. If your incision is causing pain, you may take over-the counter pain relievers such as acetaminophen (Tylenol). If you were prescribed a stronger pain medication, please be aware these medications can cause nausea and constipation. Prevent  nausea by taking the medication with a snack or meal. Avoid constipation by drinking plenty of fluids and eating foods with high amount of fiber, such as fruits, vegetables, and grains.  Do not take Tylenol if you are taking prescription pain medications.  Follow up Your surgeon may want to see you in the office following your access surgery. If so, this will be arranged at the time of your surgery.  Please call us immediately for any of the following conditions:  Increased pain, redness, drainage (pus) from your incision site Fever of 101 degrees or higher Severe or worsening pain at your incision site Hand pain or numbness.  Reduce your risk of vascular disease:  Stop smoking. If you would like help, call [facility] at 1-800-QUIT-NOW 718-325-9790) or [place] at [place] your cholesterol Maintain a desired weight Control your diabetes Keep your blood pressure down  Dialysis  It will take several weeks to several months for your new dialysis access to be ready for use. Your surgeon will determine when it is okay to use it. Your nephrologist will continue to direct your dialysis. You can continue to use your Permcath until your new access is ready for use.   07/12/2021 Danny [name] [id number] 13-Jun-1956  Surgeon(s): Enora Trillo, Arvilla Meres, MD  Procedure(s): LEFT ARM ARTERIOVENOUS FISTULA SUPERFICIALIZATION   May stick graft immediately   May stick graft on designated area only:    Do not stick fistula for 6 weeks    If you have any questions, please call the office at [phone].

## 2021-07-13 ENCOUNTER — Encounter (HOSPITAL_COMMUNITY): Payer: Self-pay | Admitting: Vascular Surgery

## 2021-07-13 DIAGNOSIS — L89513 Pressure ulcer of right ankle, stage 3: Secondary | ICD-10-CM | POA: Diagnosis not present

## 2021-07-13 DIAGNOSIS — E669 Obesity, unspecified: Secondary | ICD-10-CM | POA: Diagnosis not present

## 2021-07-13 DIAGNOSIS — E11622 Type 2 diabetes mellitus with other skin ulcer: Secondary | ICD-10-CM | POA: Diagnosis not present

## 2021-07-13 DIAGNOSIS — M6281 Muscle weakness (generalized): Secondary | ICD-10-CM | POA: Diagnosis not present

## 2021-07-14 DIAGNOSIS — N186 End stage renal disease: Secondary | ICD-10-CM | POA: Diagnosis not present

## 2021-07-14 DIAGNOSIS — D631 Anemia in chronic kidney disease: Secondary | ICD-10-CM | POA: Diagnosis not present

## 2021-07-14 DIAGNOSIS — Z992 Dependence on renal dialysis: Secondary | ICD-10-CM | POA: Diagnosis not present

## 2021-07-16 DIAGNOSIS — Z992 Dependence on renal dialysis: Secondary | ICD-10-CM | POA: Diagnosis not present

## 2021-07-16 DIAGNOSIS — N186 End stage renal disease: Secondary | ICD-10-CM | POA: Diagnosis not present

## 2021-07-16 DIAGNOSIS — D631 Anemia in chronic kidney disease: Secondary | ICD-10-CM | POA: Diagnosis not present

## 2021-07-19 DIAGNOSIS — D631 Anemia in chronic kidney disease: Secondary | ICD-10-CM | POA: Diagnosis not present

## 2021-07-19 DIAGNOSIS — Z992 Dependence on renal dialysis: Secondary | ICD-10-CM | POA: Diagnosis not present

## 2021-07-19 DIAGNOSIS — Z1159 Encounter for screening for other viral diseases: Secondary | ICD-10-CM | POA: Diagnosis not present

## 2021-07-19 DIAGNOSIS — N186 End stage renal disease: Secondary | ICD-10-CM | POA: Diagnosis not present

## 2021-07-19 DIAGNOSIS — D509 Iron deficiency anemia, unspecified: Secondary | ICD-10-CM | POA: Diagnosis not present

## 2021-07-20 DIAGNOSIS — E11622 Type 2 diabetes mellitus with other skin ulcer: Secondary | ICD-10-CM | POA: Diagnosis not present

## 2021-07-20 DIAGNOSIS — L89513 Pressure ulcer of right ankle, stage 3: Secondary | ICD-10-CM | POA: Diagnosis not present

## 2021-07-20 DIAGNOSIS — E669 Obesity, unspecified: Secondary | ICD-10-CM | POA: Diagnosis not present

## 2021-07-20 DIAGNOSIS — L89520 Pressure ulcer of left ankle, unstageable: Secondary | ICD-10-CM | POA: Diagnosis not present

## 2021-07-21 DIAGNOSIS — D631 Anemia in chronic kidney disease: Secondary | ICD-10-CM | POA: Diagnosis not present

## 2021-07-21 DIAGNOSIS — N186 End stage renal disease: Secondary | ICD-10-CM | POA: Diagnosis not present

## 2021-07-21 DIAGNOSIS — Z992 Dependence on renal dialysis: Secondary | ICD-10-CM | POA: Diagnosis not present

## 2021-07-23 DIAGNOSIS — Z992 Dependence on renal dialysis: Secondary | ICD-10-CM | POA: Diagnosis not present

## 2021-07-23 DIAGNOSIS — D631 Anemia in chronic kidney disease: Secondary | ICD-10-CM | POA: Diagnosis not present

## 2021-07-23 DIAGNOSIS — N186 End stage renal disease: Secondary | ICD-10-CM | POA: Diagnosis not present

## 2021-07-26 DIAGNOSIS — N186 End stage renal disease: Secondary | ICD-10-CM | POA: Diagnosis not present

## 2021-07-26 DIAGNOSIS — Z992 Dependence on renal dialysis: Secondary | ICD-10-CM | POA: Diagnosis not present

## 2021-07-26 DIAGNOSIS — D631 Anemia in chronic kidney disease: Secondary | ICD-10-CM | POA: Diagnosis not present

## 2021-07-27 DIAGNOSIS — L89523 Pressure ulcer of left ankle, stage 3: Secondary | ICD-10-CM | POA: Diagnosis not present

## 2021-07-27 DIAGNOSIS — L89153 Pressure ulcer of sacral region, stage 3: Secondary | ICD-10-CM | POA: Diagnosis not present

## 2021-07-27 DIAGNOSIS — L98492 Non-pressure chronic ulcer of skin of other sites with fat layer exposed: Secondary | ICD-10-CM | POA: Diagnosis not present

## 2021-07-27 DIAGNOSIS — L89520 Pressure ulcer of left ankle, unstageable: Secondary | ICD-10-CM | POA: Diagnosis not present

## 2021-07-27 DIAGNOSIS — L89513 Pressure ulcer of right ankle, stage 3: Secondary | ICD-10-CM | POA: Diagnosis not present

## 2021-07-27 DIAGNOSIS — E11622 Type 2 diabetes mellitus with other skin ulcer: Secondary | ICD-10-CM | POA: Diagnosis not present

## 2021-07-27 DIAGNOSIS — E669 Obesity, unspecified: Secondary | ICD-10-CM | POA: Diagnosis not present

## 2021-07-28 DIAGNOSIS — D631 Anemia in chronic kidney disease: Secondary | ICD-10-CM | POA: Diagnosis not present

## 2021-07-28 DIAGNOSIS — Z992 Dependence on renal dialysis: Secondary | ICD-10-CM | POA: Diagnosis not present

## 2021-07-28 DIAGNOSIS — N186 End stage renal disease: Secondary | ICD-10-CM | POA: Diagnosis not present

## 2021-07-30 DIAGNOSIS — N186 End stage renal disease: Secondary | ICD-10-CM | POA: Diagnosis not present

## 2021-07-30 DIAGNOSIS — Z992 Dependence on renal dialysis: Secondary | ICD-10-CM | POA: Diagnosis not present

## 2021-07-30 DIAGNOSIS — D631 Anemia in chronic kidney disease: Secondary | ICD-10-CM | POA: Diagnosis not present

## 2021-08-02 DIAGNOSIS — N186 End stage renal disease: Secondary | ICD-10-CM | POA: Diagnosis not present

## 2021-08-02 DIAGNOSIS — D631 Anemia in chronic kidney disease: Secondary | ICD-10-CM | POA: Diagnosis not present

## 2021-08-02 DIAGNOSIS — Z992 Dependence on renal dialysis: Secondary | ICD-10-CM | POA: Diagnosis not present

## 2021-08-03 DIAGNOSIS — L89520 Pressure ulcer of left ankle, unstageable: Secondary | ICD-10-CM | POA: Diagnosis not present

## 2021-08-03 DIAGNOSIS — E669 Obesity, unspecified: Secondary | ICD-10-CM | POA: Diagnosis not present

## 2021-08-03 DIAGNOSIS — E11622 Type 2 diabetes mellitus with other skin ulcer: Secondary | ICD-10-CM | POA: Diagnosis not present

## 2021-08-03 DIAGNOSIS — L89513 Pressure ulcer of right ankle, stage 3: Secondary | ICD-10-CM | POA: Diagnosis not present

## 2021-08-04 DIAGNOSIS — D631 Anemia in chronic kidney disease: Secondary | ICD-10-CM | POA: Diagnosis not present

## 2021-08-04 DIAGNOSIS — M6281 Muscle weakness (generalized): Secondary | ICD-10-CM | POA: Diagnosis not present

## 2021-08-04 DIAGNOSIS — I5033 Acute on chronic diastolic (congestive) heart failure: Secondary | ICD-10-CM | POA: Diagnosis not present

## 2021-08-04 DIAGNOSIS — R2689 Other abnormalities of gait and mobility: Secondary | ICD-10-CM | POA: Diagnosis not present

## 2021-08-04 DIAGNOSIS — Z992 Dependence on renal dialysis: Secondary | ICD-10-CM | POA: Diagnosis not present

## 2021-08-04 DIAGNOSIS — R2681 Unsteadiness on feet: Secondary | ICD-10-CM | POA: Diagnosis not present

## 2021-08-04 DIAGNOSIS — N186 End stage renal disease: Secondary | ICD-10-CM | POA: Diagnosis not present

## 2021-08-06 DIAGNOSIS — N186 End stage renal disease: Secondary | ICD-10-CM | POA: Diagnosis not present

## 2021-08-06 DIAGNOSIS — D631 Anemia in chronic kidney disease: Secondary | ICD-10-CM | POA: Diagnosis not present

## 2021-08-06 DIAGNOSIS — R2681 Unsteadiness on feet: Secondary | ICD-10-CM | POA: Diagnosis not present

## 2021-08-06 DIAGNOSIS — R2689 Other abnormalities of gait and mobility: Secondary | ICD-10-CM | POA: Diagnosis not present

## 2021-08-06 DIAGNOSIS — M6281 Muscle weakness (generalized): Secondary | ICD-10-CM | POA: Diagnosis not present

## 2021-08-06 DIAGNOSIS — Z992 Dependence on renal dialysis: Secondary | ICD-10-CM | POA: Diagnosis not present

## 2021-08-06 DIAGNOSIS — I5033 Acute on chronic diastolic (congestive) heart failure: Secondary | ICD-10-CM | POA: Diagnosis not present

## 2021-08-08 DIAGNOSIS — R2689 Other abnormalities of gait and mobility: Secondary | ICD-10-CM | POA: Diagnosis not present

## 2021-08-08 DIAGNOSIS — M6281 Muscle weakness (generalized): Secondary | ICD-10-CM | POA: Diagnosis not present

## 2021-08-08 DIAGNOSIS — I5033 Acute on chronic diastolic (congestive) heart failure: Secondary | ICD-10-CM | POA: Diagnosis not present

## 2021-08-08 DIAGNOSIS — R2681 Unsteadiness on feet: Secondary | ICD-10-CM | POA: Diagnosis not present

## 2021-08-09 DIAGNOSIS — R2681 Unsteadiness on feet: Secondary | ICD-10-CM | POA: Diagnosis not present

## 2021-08-09 DIAGNOSIS — R2689 Other abnormalities of gait and mobility: Secondary | ICD-10-CM | POA: Diagnosis not present

## 2021-08-09 DIAGNOSIS — M6281 Muscle weakness (generalized): Secondary | ICD-10-CM | POA: Diagnosis not present

## 2021-08-09 DIAGNOSIS — I5033 Acute on chronic diastolic (congestive) heart failure: Secondary | ICD-10-CM | POA: Diagnosis not present

## 2021-08-10 ENCOUNTER — Telehealth (HOSPITAL_COMMUNITY): Payer: Self-pay | Admitting: *Deleted

## 2021-08-10 ENCOUNTER — Ambulatory Visit (INDEPENDENT_AMBULATORY_CARE_PROVIDER_SITE_OTHER): Payer: Medicare Other | Admitting: Vascular Surgery

## 2021-08-10 ENCOUNTER — Inpatient Hospital Stay (HOSPITAL_COMMUNITY): Payer: Medicare Other

## 2021-08-10 ENCOUNTER — Other Ambulatory Visit: Payer: Self-pay

## 2021-08-10 ENCOUNTER — Encounter: Payer: Self-pay | Admitting: Vascular Surgery

## 2021-08-10 ENCOUNTER — Other Ambulatory Visit (HOSPITAL_COMMUNITY): Payer: Self-pay | Admitting: Physician Assistant

## 2021-08-10 VITALS — BP 145/77 | HR 86 | Temp 98.7°F | Resp 14 | Ht 73.0 in | Wt 242.0 lb

## 2021-08-10 DIAGNOSIS — E1140 Type 2 diabetes mellitus with diabetic neuropathy, unspecified: Secondary | ICD-10-CM | POA: Diagnosis not present

## 2021-08-10 DIAGNOSIS — Z20822 Contact with and (suspected) exposure to covid-19: Secondary | ICD-10-CM | POA: Diagnosis not present

## 2021-08-10 DIAGNOSIS — I132 Hypertensive heart and chronic kidney disease with heart failure and with stage 5 chronic kidney disease, or end stage renal disease: Secondary | ICD-10-CM | POA: Diagnosis not present

## 2021-08-10 DIAGNOSIS — I4891 Unspecified atrial fibrillation: Secondary | ICD-10-CM | POA: Diagnosis not present

## 2021-08-10 DIAGNOSIS — Z794 Long term (current) use of insulin: Secondary | ICD-10-CM | POA: Insufficient documentation

## 2021-08-10 DIAGNOSIS — Z66 Do not resuscitate: Secondary | ICD-10-CM | POA: Diagnosis not present

## 2021-08-10 DIAGNOSIS — Z6831 Body mass index (BMI) 31.0-31.9, adult: Secondary | ICD-10-CM | POA: Diagnosis not present

## 2021-08-10 DIAGNOSIS — I872 Venous insufficiency (chronic) (peripheral): Secondary | ICD-10-CM | POA: Diagnosis not present

## 2021-08-10 DIAGNOSIS — L97319 Non-pressure chronic ulcer of right ankle with unspecified severity: Secondary | ICD-10-CM | POA: Diagnosis not present

## 2021-08-10 DIAGNOSIS — I5033 Acute on chronic diastolic (congestive) heart failure: Secondary | ICD-10-CM | POA: Diagnosis not present

## 2021-08-10 DIAGNOSIS — E1169 Type 2 diabetes mellitus with other specified complication: Secondary | ICD-10-CM | POA: Diagnosis not present

## 2021-08-10 DIAGNOSIS — E1122 Type 2 diabetes mellitus with diabetic chronic kidney disease: Secondary | ICD-10-CM | POA: Diagnosis not present

## 2021-08-10 DIAGNOSIS — Z993 Dependence on wheelchair: Secondary | ICD-10-CM | POA: Diagnosis not present

## 2021-08-10 DIAGNOSIS — Z8249 Family history of ischemic heart disease and other diseases of the circulatory system: Secondary | ICD-10-CM | POA: Diagnosis not present

## 2021-08-10 DIAGNOSIS — Z87891 Personal history of nicotine dependence: Secondary | ICD-10-CM | POA: Insufficient documentation

## 2021-08-10 DIAGNOSIS — N186 End stage renal disease: Secondary | ICD-10-CM | POA: Diagnosis not present

## 2021-08-10 DIAGNOSIS — L89520 Pressure ulcer of left ankle, unstageable: Secondary | ICD-10-CM | POA: Diagnosis not present

## 2021-08-10 DIAGNOSIS — N185 Chronic kidney disease, stage 5: Secondary | ICD-10-CM | POA: Diagnosis not present

## 2021-08-10 DIAGNOSIS — E871 Hypo-osmolality and hyponatremia: Secondary | ICD-10-CM | POA: Diagnosis not present

## 2021-08-10 DIAGNOSIS — E213 Hyperparathyroidism, unspecified: Secondary | ICD-10-CM | POA: Diagnosis not present

## 2021-08-10 DIAGNOSIS — E538 Deficiency of other specified B group vitamins: Secondary | ICD-10-CM | POA: Insufficient documentation

## 2021-08-10 DIAGNOSIS — L89513 Pressure ulcer of right ankle, stage 3: Secondary | ICD-10-CM | POA: Diagnosis not present

## 2021-08-10 DIAGNOSIS — D631 Anemia in chronic kidney disease: Secondary | ICD-10-CM | POA: Diagnosis not present

## 2021-08-10 DIAGNOSIS — I480 Paroxysmal atrial fibrillation: Secondary | ICD-10-CM | POA: Diagnosis not present

## 2021-08-10 DIAGNOSIS — Z992 Dependence on renal dialysis: Secondary | ICD-10-CM | POA: Insufficient documentation

## 2021-08-10 DIAGNOSIS — Z79899 Other long term (current) drug therapy: Secondary | ICD-10-CM | POA: Insufficient documentation

## 2021-08-10 DIAGNOSIS — R002 Palpitations: Secondary | ICD-10-CM | POA: Diagnosis not present

## 2021-08-10 DIAGNOSIS — D649 Anemia, unspecified: Secondary | ICD-10-CM

## 2021-08-10 DIAGNOSIS — N2581 Secondary hyperparathyroidism of renal origin: Secondary | ICD-10-CM | POA: Diagnosis not present

## 2021-08-10 DIAGNOSIS — E785 Hyperlipidemia, unspecified: Secondary | ICD-10-CM | POA: Diagnosis not present

## 2021-08-10 DIAGNOSIS — I5030 Unspecified diastolic (congestive) heart failure: Secondary | ICD-10-CM | POA: Diagnosis not present

## 2021-08-10 DIAGNOSIS — N400 Enlarged prostate without lower urinary tract symptoms: Secondary | ICD-10-CM | POA: Diagnosis not present

## 2021-08-10 DIAGNOSIS — E876 Hypokalemia: Secondary | ICD-10-CM | POA: Diagnosis present

## 2021-08-10 DIAGNOSIS — D509 Iron deficiency anemia, unspecified: Secondary | ICD-10-CM | POA: Diagnosis present

## 2021-08-10 DIAGNOSIS — I5032 Chronic diastolic (congestive) heart failure: Secondary | ICD-10-CM | POA: Diagnosis not present

## 2021-08-10 DIAGNOSIS — D472 Monoclonal gammopathy: Secondary | ICD-10-CM | POA: Insufficient documentation

## 2021-08-10 DIAGNOSIS — R2681 Unsteadiness on feet: Secondary | ICD-10-CM | POA: Diagnosis not present

## 2021-08-10 DIAGNOSIS — J90 Pleural effusion, not elsewhere classified: Secondary | ICD-10-CM | POA: Diagnosis not present

## 2021-08-10 DIAGNOSIS — R279 Unspecified lack of coordination: Secondary | ICD-10-CM | POA: Diagnosis not present

## 2021-08-10 DIAGNOSIS — E440 Moderate protein-calorie malnutrition: Secondary | ICD-10-CM | POA: Diagnosis not present

## 2021-08-10 DIAGNOSIS — E11622 Type 2 diabetes mellitus with other skin ulcer: Secondary | ICD-10-CM | POA: Diagnosis not present

## 2021-08-10 DIAGNOSIS — M6281 Muscle weakness (generalized): Secondary | ICD-10-CM | POA: Diagnosis not present

## 2021-08-10 DIAGNOSIS — K5900 Constipation, unspecified: Secondary | ICD-10-CM | POA: Diagnosis present

## 2021-08-10 DIAGNOSIS — Z7901 Long term (current) use of anticoagulants: Secondary | ICD-10-CM | POA: Insufficient documentation

## 2021-08-10 DIAGNOSIS — E669 Obesity, unspecified: Secondary | ICD-10-CM | POA: Diagnosis not present

## 2021-08-10 DIAGNOSIS — E1165 Type 2 diabetes mellitus with hyperglycemia: Secondary | ICD-10-CM | POA: Diagnosis not present

## 2021-08-10 DIAGNOSIS — R2689 Other abnormalities of gait and mobility: Secondary | ICD-10-CM | POA: Diagnosis not present

## 2021-08-10 LAB — CBC WITH DIFFERENTIAL/PLATELET
Abs Immature Granulocytes: 0.01 10*3/uL (ref 0.00–0.07)
Basophils Absolute: 0 10*3/uL (ref 0.0–0.1)
Basophils Relative: 1 %
Eosinophils Absolute: 0.4 10*3/uL (ref 0.0–0.5)
Eosinophils Relative: 6 %
HCT: 19.9 % — ABNORMAL LOW (ref 39.0–52.0)
Hemoglobin: 6.4 g/dL — CL (ref 13.0–17.0)
Immature Granulocytes: 0 %
Lymphocytes Relative: 15 %
Lymphs Abs: 1 10*3/uL (ref 0.7–4.0)
MCH: 30 pg (ref 26.0–34.0)
MCHC: 32.2 g/dL (ref 30.0–36.0)
MCV: 93.4 fL (ref 80.0–100.0)
Monocytes Absolute: 0.5 10*3/uL (ref 0.1–1.0)
Monocytes Relative: 8 %
Neutro Abs: 4.6 10*3/uL (ref 1.7–7.7)
Neutrophils Relative %: 70 %
Platelets: 232 10*3/uL (ref 150–400)
RBC: 2.13 MIL/uL — ABNORMAL LOW (ref 4.22–5.81)
RDW: 18.6 % — ABNORMAL HIGH (ref 11.5–15.5)
WBC: 6.5 10*3/uL (ref 4.0–10.5)
nRBC: 0 % (ref 0.0–0.2)

## 2021-08-10 LAB — COMPREHENSIVE METABOLIC PANEL
ALT: 16 U/L (ref 0–44)
AST: 19 U/L (ref 15–41)
Albumin: 2.3 g/dL — ABNORMAL LOW (ref 3.5–5.0)
Alkaline Phosphatase: 124 U/L (ref 38–126)
Anion gap: 14 (ref 5–15)
BUN: 59 mg/dL — ABNORMAL HIGH (ref 8–23)
CO2: 22 mmol/L (ref 22–32)
Calcium: 8.5 mg/dL — ABNORMAL LOW (ref 8.9–10.3)
Chloride: 100 mmol/L (ref 98–111)
Creatinine, Ser: 5.13 mg/dL — ABNORMAL HIGH (ref 0.61–1.24)
GFR, Estimated: 12 mL/min — ABNORMAL LOW (ref >60)
Glucose, Bld: 140 mg/dL — ABNORMAL HIGH (ref 70–99)
Potassium: 4.2 mmol/L (ref 3.5–5.1)
Sodium: 136 mmol/L (ref 135–145)
Total Bilirubin: 0.7 mg/dL (ref 0.3–1.2)
Total Protein: 6.7 g/dL (ref 6.5–8.1)

## 2021-08-10 LAB — VITAMIN B12: Vitamin B-12: 1777 pg/mL — ABNORMAL HIGH (ref 180–914)

## 2021-08-10 LAB — FERRITIN: Ferritin: 237 ng/mL (ref 24–336)

## 2021-08-10 LAB — FOLATE: Folate: 9.4 ng/mL (ref >5.9)

## 2021-08-10 LAB — LACTATE DEHYDROGENASE: LDH: 116 U/L (ref 98–192)

## 2021-08-10 LAB — IRON AND TIBC
Iron: 63 ug/dL (ref 45–182)
Saturation Ratios: 24 % (ref 17.9–39.5)
TIBC: 262 ug/dL (ref 250–450)
UIBC: 199 ug/dL

## 2021-08-10 LAB — PREPARE RBC (CROSSMATCH)

## 2021-08-10 NOTE — Telephone Encounter (Signed)
Notified of critical value of hgb of 6.4.  Patient has been scheduled for T/S and crossmatch for today and will receive blood, per order on Friday at 8:30.  Elmyra Ricks at [facility] notified of appointments and transportation arranged.  Also notified Joy at [facility] of hgb, seeing that he is scheduled for dialysis in the morning. They will continue with treatment tomorrow.  Spoke with patient, as well to advise of results and plan of care.  Verbalized understanding.

## 2021-08-10 NOTE — Progress Notes (Signed)
Vascular and Vein Specialist of [place]  Patient name: Danny [name] MRN: [id number] DOB: [birth year] Sex: male  REASON FOR VISIT: Follow-up left arm AV fistula  HPI: Danny [name] is a 66 y.o. male here today for follow-up.  He underwent superficial mobilization of his left upper arm brachial cephalic fistula on 4/58/0998.  Current Outpatient Medications  Medication Sig Dispense Refill   acetaminophen (TYLENOL) 500 MG tablet Take 1,000 mg by mouth every 6 (six) hours as needed for moderate pain or headache.     albuterol (ACCUNEB) 0.63 MG/3ML nebulizer solution Take by nebulization.     apixaban (ELIQUIS) 5 MG TABS tablet Take 1 tablet (5 mg total) by mouth 2 (two) times daily. 60 tablet    Ascorbic Acid (VITAMIN C) 500 MG CHEW Chew 1,000 mg by mouth daily.     cholecalciferol (VITAMIN D3) 25 MCG (1000 UNIT) tablet Take by mouth daily.     collagenase (SANTYL) ointment Apply to right lateral malleolus once daily; apply in a 1/8 inch layer and top with saline moistened gauze 2X2.  0   docusate sodium (COLACE) 100 MG capsule Take 100 mg by mouth 2 (two) times daily.     ferrous sulfate 325 (65 FE) MG tablet Take 325 mg by mouth 2 (two) times daily with a meal.     Insulin NPH Isophane & Regular (HUMULIN 70/30 KWIKPEN [place]) Inject 5 Units into the skin 2 (two) times daily.     Lactulose 20 GM/30ML SOLN Take 30 mLs (20 g total) by mouth daily. (Patient taking differently: Take 30 mLs by mouth 2 (two) times daily.) 450 mL 1   metoprolol succinate (TOPROL-XL) 25 MG 24 hr tablet Take 1 tablet (25 mg total) by mouth in the morning and at bedtime.     Multiple Vitamin (MULTIVITAMIN WITH MINERALS) TABS tablet Take 1 tablet by mouth daily.     Nutritional Supplements (FEEDING SUPPLEMENT, NEPRO CARB STEADY,) LIQD Take 237 mLs by mouth 2 (two) times daily between meals.  0   Omega-3 Fatty Acids (FISH OIL PO) Take 2,400 mg by mouth 2 (two) times daily.     ondansetron  (ZOFRAN) 4 MG tablet Take 4 mg by mouth every 6 (six) hours. Prn. (Patient not taking: Reported on 05/16/2021)     oxycodone (OXY-IR) 5 MG capsule Take 5 mg by mouth every 6 (six) hours as needed.     polyethylene glycol (MIRALAX / GLYCOLAX) 17 g packet Take 17 g by mouth daily. 14 each 0   polyethylene glycol-electrolytes (TRILYTE) 420 g solution Take 4,000 mLs by mouth as directed. 4000 mL 0   rosuvastatin (CRESTOR) 5 MG tablet Take 5 mg by mouth daily.     senna (SENOKOT) 8.6 MG tablet Take 1 tablet by mouth 2 (two) times daily.     sevelamer carbonate (RENVELA) 800 MG tablet Take 1 tablet (800 mg total) by mouth 2 (two) times daily with a meal.     sodium phosphate Pediatric (FLEET) 3.5-9.5 GM/59ML enema Place 1 enema rectally once as needed for severe constipation.     tamsulosin (FLOMAX) 0.4 MG CAPS capsule Take 1 capsule (0.4 mg total) by mouth daily after supper. 30 capsule 11   vitamin B-12 (CYANOCOBALAMIN) 1000 MCG tablet Take 1,000 mcg by mouth daily.     vitamin E 1000 UNIT capsule Take 1,000 Units by mouth daily.     XARELTO 20 MG TABS tablet Take 20 mg by mouth daily.     Zinc  30 MG CAPS Take 1 capsule by mouth daily.     No current facility-administered medications for this visit.     PHYSICAL EXAM: Vitals:   08/10/21 1317  BP: (!) 145/77  Pulse: 86  Resp: 14  Temp: 98.7 F (37.1 C)  TempSrc: Temporal  SpO2: 92%  Weight: 242 lb (109.8 kg)  Height: 6\' 1"  (1.854 m)    GENERAL: The patient is a well-nourished male, in no acute distress. The vital signs are documented above. Excellent healing of both incisions over the fistula.  He has excellent size maturation and the vein is now very superficial to the skin  MEDICAL ISSUES: Feel that he is able to use his fistula at any time.  He does have dialysis currently via a right IJ tunnel catheter which was placed by interventional radiology in [place].  I am available to remove this at [facility] once he has had  successful use of his fistula.  He does have hemodialysis on Tuesday, Thursday and Saturday so would have to coordinate catheter removal when I am at [facility] on a Tuesday.   Rosetta Posner, MD FACS [facility] and Vein Specialists of [facility] [phone]  Note: Portions of this report may have been transcribed using voice recognition software.  Every effort has been made to ensure accuracy; however, inadvertent computerized transcription errors may still be present.

## 2021-08-10 NOTE — Progress Notes (Signed)
Patient scheduled for T/S and crossmatch today with blood products on Friday 2/1/7 @ 8:30.  [facility], dialysis clinic and patient made aware of plan.

## 2021-08-10 NOTE — Progress Notes (Unsigned)
CRITICAL VALUE ALERT Critical value received:  HGB 6.4 Date of notification:  08-10-2021 Time of notification: 11:22 am  Critical value read back:  Yes.   Nurse who received alert:  B . Zophia Marrone RN MD notified time and response:  R. Pennington PA. / Renda Rolls.    Per R. Pennington. Patient to return to lab for type and screen and schedule for 1 unit of blood .

## 2021-08-11 DIAGNOSIS — Z992 Dependence on renal dialysis: Secondary | ICD-10-CM | POA: Diagnosis not present

## 2021-08-11 DIAGNOSIS — D631 Anemia in chronic kidney disease: Secondary | ICD-10-CM | POA: Diagnosis not present

## 2021-08-11 DIAGNOSIS — N186 End stage renal disease: Secondary | ICD-10-CM | POA: Diagnosis not present

## 2021-08-11 LAB — PROTEIN ELECTROPHORESIS, SERUM
A/G Ratio: 0.7 (ref 0.7–1.7)
Albumin ELP: 2.7 g/dL — ABNORMAL LOW (ref 2.9–4.4)
Alpha-1-Globulin: 0.2 g/dL (ref 0.0–0.4)
Alpha-2-Globulin: 0.8 g/dL (ref 0.4–1.0)
Beta Globulin: 0.9 g/dL (ref 0.7–1.3)
Gamma Globulin: 2.2 g/dL — ABNORMAL HIGH (ref 0.4–1.8)
Globulin, Total: 4 g/dL — ABNORMAL HIGH (ref 2.2–3.9)
Total Protein ELP: 6.7 g/dL (ref 6.0–8.5)

## 2021-08-11 LAB — KAPPA/LAMBDA LIGHT CHAINS
Kappa free light chain: 311.1 mg/L — ABNORMAL HIGH (ref 3.3–19.4)
Kappa, lambda light chain ratio: 1.41 (ref 0.26–1.65)
Lambda free light chains: 221.2 mg/L — ABNORMAL HIGH (ref 5.7–26.3)

## 2021-08-11 LAB — HOMOCYSTEINE: Homocysteine: 19.1 umol/L — ABNORMAL HIGH (ref 0.0–17.2)

## 2021-08-12 ENCOUNTER — Encounter (HOSPITAL_COMMUNITY): Payer: Self-pay

## 2021-08-12 ENCOUNTER — Inpatient Hospital Stay (HOSPITAL_COMMUNITY)
Admission: EM | Admit: 2021-08-12 | Discharge: 2021-08-16 | DRG: 291 | Disposition: A | Discharge status: Skilled Nursing Facility | Payer: Medicare Other | Source: Skilled Nursing Facility | Attending: Internal Medicine | Admitting: Internal Medicine

## 2021-08-12 ENCOUNTER — Other Ambulatory Visit: Payer: Self-pay

## 2021-08-12 ENCOUNTER — Inpatient Hospital Stay (HOSPITAL_COMMUNITY): Payer: Medicare Other

## 2021-08-12 ENCOUNTER — Emergency Department (HOSPITAL_COMMUNITY): Payer: Medicare Other

## 2021-08-12 DIAGNOSIS — Z6831 Body mass index (BMI) 31.0-31.9, adult: Secondary | ICD-10-CM

## 2021-08-12 DIAGNOSIS — Z20822 Contact with and (suspected) exposure to covid-19: Secondary | ICD-10-CM | POA: Diagnosis present

## 2021-08-12 DIAGNOSIS — Z87891 Personal history of nicotine dependence: Secondary | ICD-10-CM

## 2021-08-12 DIAGNOSIS — I4891 Unspecified atrial fibrillation: Secondary | ICD-10-CM | POA: Diagnosis present

## 2021-08-12 DIAGNOSIS — Z7901 Long term (current) use of anticoagulants: Secondary | ICD-10-CM

## 2021-08-12 DIAGNOSIS — E669 Obesity, unspecified: Secondary | ICD-10-CM | POA: Diagnosis present

## 2021-08-12 DIAGNOSIS — E1165 Type 2 diabetes mellitus with hyperglycemia: Secondary | ICD-10-CM | POA: Diagnosis present

## 2021-08-12 DIAGNOSIS — R339 Retention of urine, unspecified: Secondary | ICD-10-CM | POA: Diagnosis present

## 2021-08-12 DIAGNOSIS — K5900 Constipation, unspecified: Secondary | ICD-10-CM | POA: Diagnosis present

## 2021-08-12 DIAGNOSIS — E1122 Type 2 diabetes mellitus with diabetic chronic kidney disease: Secondary | ICD-10-CM | POA: Diagnosis present

## 2021-08-12 DIAGNOSIS — D649 Anemia, unspecified: Secondary | ICD-10-CM | POA: Diagnosis present

## 2021-08-12 DIAGNOSIS — I132 Hypertensive heart and chronic kidney disease with heart failure and with stage 5 chronic kidney disease, or end stage renal disease: Principal | ICD-10-CM | POA: Diagnosis present

## 2021-08-12 DIAGNOSIS — E871 Hypo-osmolality and hyponatremia: Secondary | ICD-10-CM | POA: Diagnosis present

## 2021-08-12 DIAGNOSIS — D509 Iron deficiency anemia, unspecified: Secondary | ICD-10-CM | POA: Diagnosis present

## 2021-08-12 DIAGNOSIS — Z79899 Other long term (current) drug therapy: Secondary | ICD-10-CM

## 2021-08-12 DIAGNOSIS — D631 Anemia in chronic kidney disease: Secondary | ICD-10-CM | POA: Diagnosis present

## 2021-08-12 DIAGNOSIS — E876 Hypokalemia: Secondary | ICD-10-CM | POA: Diagnosis present

## 2021-08-12 DIAGNOSIS — I5032 Chronic diastolic (congestive) heart failure: Secondary | ICD-10-CM | POA: Diagnosis present

## 2021-08-12 DIAGNOSIS — Z992 Dependence on renal dialysis: Secondary | ICD-10-CM

## 2021-08-12 DIAGNOSIS — Z8249 Family history of ischemic heart disease and other diseases of the circulatory system: Secondary | ICD-10-CM | POA: Diagnosis not present

## 2021-08-12 DIAGNOSIS — D472 Monoclonal gammopathy: Secondary | ICD-10-CM | POA: Diagnosis present

## 2021-08-12 DIAGNOSIS — N2581 Secondary hyperparathyroidism of renal origin: Secondary | ICD-10-CM | POA: Diagnosis present

## 2021-08-12 DIAGNOSIS — Z833 Family history of diabetes mellitus: Secondary | ICD-10-CM

## 2021-08-12 DIAGNOSIS — L97319 Non-pressure chronic ulcer of right ankle with unspecified severity: Secondary | ICD-10-CM | POA: Diagnosis present

## 2021-08-12 DIAGNOSIS — I480 Paroxysmal atrial fibrillation: Secondary | ICD-10-CM | POA: Diagnosis present

## 2021-08-12 DIAGNOSIS — E1169 Type 2 diabetes mellitus with other specified complication: Secondary | ICD-10-CM | POA: Diagnosis present

## 2021-08-12 DIAGNOSIS — Z66 Do not resuscitate: Secondary | ICD-10-CM | POA: Diagnosis present

## 2021-08-12 DIAGNOSIS — R231 Pallor: Secondary | ICD-10-CM | POA: Diagnosis present

## 2021-08-12 DIAGNOSIS — Z888 Allergy status to other drugs, medicaments and biological substances status: Secondary | ICD-10-CM

## 2021-08-12 DIAGNOSIS — E785 Hyperlipidemia, unspecified: Secondary | ICD-10-CM | POA: Diagnosis present

## 2021-08-12 DIAGNOSIS — N186 End stage renal disease: Secondary | ICD-10-CM | POA: Diagnosis present

## 2021-08-12 DIAGNOSIS — I482 Chronic atrial fibrillation, unspecified: Secondary | ICD-10-CM | POA: Diagnosis present

## 2021-08-12 DIAGNOSIS — M109 Gout, unspecified: Secondary | ICD-10-CM | POA: Diagnosis present

## 2021-08-12 DIAGNOSIS — M1990 Unspecified osteoarthritis, unspecified site: Secondary | ICD-10-CM | POA: Diagnosis present

## 2021-08-12 DIAGNOSIS — Z993 Dependence on wheelchair: Secondary | ICD-10-CM

## 2021-08-12 DIAGNOSIS — E119 Type 2 diabetes mellitus without complications: Secondary | ICD-10-CM | POA: Diagnosis present

## 2021-08-12 LAB — COMPREHENSIVE METABOLIC PANEL
ALT: 18 U/L (ref 0–44)
AST: 25 U/L (ref 15–41)
Albumin: 2.4 g/dL — ABNORMAL LOW (ref 3.5–5.0)
Alkaline Phosphatase: 129 U/L — ABNORMAL HIGH (ref 38–126)
Anion gap: 10 (ref 5–15)
BUN: 32 mg/dL — ABNORMAL HIGH (ref 8–23)
CO2: 25 mmol/L (ref 22–32)
Calcium: 8.1 mg/dL — ABNORMAL LOW (ref 8.9–10.3)
Chloride: 99 mmol/L (ref 98–111)
Creatinine, Ser: 3.52 mg/dL — ABNORMAL HIGH (ref 0.61–1.24)
GFR, Estimated: 18 mL/min — ABNORMAL LOW (ref >60)
Glucose, Bld: 151 mg/dL — ABNORMAL HIGH (ref 70–99)
Potassium: 3.4 mmol/L — ABNORMAL LOW (ref 3.5–5.1)
Sodium: 134 mmol/L — ABNORMAL LOW (ref 135–145)
Total Bilirubin: 0.8 mg/dL (ref 0.3–1.2)
Total Protein: 7.2 g/dL (ref 6.5–8.1)

## 2021-08-12 LAB — CBC WITH DIFFERENTIAL/PLATELET
Abs Immature Granulocytes: 0.01 10*3/uL (ref 0.00–0.07)
Basophils Absolute: 0.1 10*3/uL (ref 0.0–0.1)
Basophils Relative: 1 %
Eosinophils Absolute: 0.4 10*3/uL (ref 0.0–0.5)
Eosinophils Relative: 5 %
HCT: 24.3 % — ABNORMAL LOW (ref 39.0–52.0)
Hemoglobin: 7.8 g/dL — ABNORMAL LOW (ref 13.0–17.0)
Immature Granulocytes: 0 %
Lymphocytes Relative: 13 %
Lymphs Abs: 1 10*3/uL (ref 0.7–4.0)
MCH: 29.8 pg (ref 26.0–34.0)
MCHC: 32.1 g/dL (ref 30.0–36.0)
MCV: 92.7 fL (ref 80.0–100.0)
Monocytes Absolute: 0.6 10*3/uL (ref 0.1–1.0)
Monocytes Relative: 8 %
Neutro Abs: 5.5 10*3/uL (ref 1.7–7.7)
Neutrophils Relative %: 73 %
Platelets: 272 10*3/uL (ref 150–400)
RBC: 2.62 MIL/uL — ABNORMAL LOW (ref 4.22–5.81)
RDW: 18.7 % — ABNORMAL HIGH (ref 11.5–15.5)
WBC: 7.6 10*3/uL (ref 4.0–10.5)
nRBC: 0 % (ref 0.0–0.2)

## 2021-08-12 LAB — HIV ANTIBODY (ROUTINE TESTING W REFLEX): HIV Screen 4th Generation wRfx: NONREACTIVE

## 2021-08-12 LAB — MRSA NEXT GEN BY PCR, NASAL: MRSA by PCR Next Gen: DETECTED — AB

## 2021-08-12 LAB — BRAIN NATRIURETIC PEPTIDE: B Natriuretic Peptide: 466 pg/mL — ABNORMAL HIGH (ref 0.0–100.0)

## 2021-08-12 LAB — RESP PANEL BY RT-PCR (FLU A&B, COVID) ARPGX2
Influenza A by PCR: NEGATIVE
Influenza B by PCR: NEGATIVE
SARS Coronavirus 2 by RT PCR: NEGATIVE

## 2021-08-12 LAB — MAGNESIUM: Magnesium: 1.8 mg/dL (ref 1.7–2.4)

## 2021-08-12 LAB — LACTIC ACID, PLASMA: Lactic Acid, Venous: 1.5 mmol/L (ref 0.5–1.9)

## 2021-08-12 MED ORDER — VITAMIN B-12 1000 MCG PO TABS
1000.0000 ug | ORAL_TABLET | Freq: Every day | ORAL | Status: DC
  Administered 2021-08-12 – 2021-08-16 (×5): 1000 ug via ORAL
  Filled 2021-08-12 (×5): qty 1

## 2021-08-12 MED ORDER — METOPROLOL TARTRATE 50 MG PO TABS
50.0000 mg | ORAL_TABLET | Freq: Two times a day (BID) | ORAL | Status: DC
  Administered 2021-08-12 – 2021-08-16 (×8): 50 mg via ORAL
  Filled 2021-08-12 (×9): qty 1

## 2021-08-12 MED ORDER — CHLORHEXIDINE GLUCONATE CLOTH 2 % EX PADS
6.0000 | MEDICATED_PAD | Freq: Every day | CUTANEOUS | Status: DC
  Administered 2021-08-13 – 2021-08-15 (×3): 6 via TOPICAL

## 2021-08-12 MED ORDER — DILTIAZEM HCL-DEXTROSE 125-5 MG/125ML-% IV SOLN (PREMIX): 5.0000 mg/h | INTRAVENOUS | Status: DC

## 2021-08-12 MED ORDER — ACETAMINOPHEN 500 MG PO TABS: 1000.0000 mg | ORAL_TABLET | Freq: Four times a day (QID) | ORAL | Status: DC | PRN

## 2021-08-12 MED ORDER — OXYCODONE HCL 5 MG PO TABS: 5.0000 mg | ORAL_TABLET | Freq: Four times a day (QID) | ORAL | Status: DC | PRN

## 2021-08-12 MED ORDER — MUPIROCIN 2 % EX OINT
1.0000 "application " | TOPICAL_OINTMENT | Freq: Two times a day (BID) | CUTANEOUS | Status: DC
  Administered 2021-08-12 – 2021-08-16 (×8): 1 via NASAL
  Filled 2021-08-12: qty 22

## 2021-08-12 MED ORDER — FERROUS SULFATE 325 (65 FE) MG PO TABS
325.0000 mg | ORAL_TABLET | Freq: Two times a day (BID) | ORAL | Status: DC
  Administered 2021-08-12 – 2021-08-14 (×4): 325 mg via ORAL
  Filled 2021-08-12 (×5): qty 1

## 2021-08-12 MED ORDER — SODIUM CHLORIDE 0.9 % IV BOLUS
1000.0000 mL | Freq: Once | INTRAVENOUS | Status: AC
  Administered 2021-08-12: 1000 mL via INTRAVENOUS

## 2021-08-12 MED ORDER — ACETAMINOPHEN 650 MG RE SUPP: 650.0000 mg | Freq: Four times a day (QID) | RECTAL | Status: DC | PRN

## 2021-08-12 MED ORDER — ONDANSETRON HCL 4 MG/2ML IJ SOLN
4.0000 mg | Freq: Four times a day (QID) | INTRAMUSCULAR | Status: DC | PRN
  Administered 2021-08-15: 4 mg via INTRAVENOUS
  Filled 2021-08-12: qty 2

## 2021-08-12 MED ORDER — METOPROLOL SUCCINATE ER 25 MG PO TB24
25.0000 mg | ORAL_TABLET | Freq: Every day | ORAL | Status: DC
Start: 2021-08-12 — End: 2021-08-12
  Filled 2021-08-12: qty 1

## 2021-08-12 MED ORDER — APIXABAN 5 MG PO TABS
5.0000 mg | ORAL_TABLET | Freq: Two times a day (BID) | ORAL | Status: DC
  Administered 2021-08-12 – 2021-08-16 (×8): 5 mg via ORAL
  Filled 2021-08-12 (×8): qty 1

## 2021-08-12 MED ORDER — ASCORBIC ACID 500 MG PO TABS
1000.0000 mg | ORAL_TABLET | Freq: Every day | ORAL | Status: DC
  Administered 2021-08-12 – 2021-08-16 (×5): 1000 mg via ORAL
  Filled 2021-08-12 (×5): qty 2

## 2021-08-12 MED ORDER — ROSUVASTATIN CALCIUM 10 MG PO TABS
5.0000 mg | ORAL_TABLET | Freq: Every day | ORAL | Status: DC
  Administered 2021-08-12 – 2021-08-16 (×5): 5 mg via ORAL
  Filled 2021-08-12 (×5): qty 1

## 2021-08-12 MED ORDER — SODIUM CHLORIDE 0.9 % IV SOLN
10.0000 mL/h | Freq: Once | INTRAVENOUS | Status: AC
  Administered 2021-08-12: 10 mL/h via INTRAVENOUS

## 2021-08-12 MED ORDER — ACETAMINOPHEN 325 MG PO TABS
650.0000 mg | ORAL_TABLET | Freq: Four times a day (QID) | ORAL | Status: DC | PRN
  Administered 2021-08-13: 650 mg via ORAL
  Filled 2021-08-12: qty 2

## 2021-08-12 MED ORDER — DILTIAZEM HCL-DEXTROSE 125-5 MG/125ML-% IV SOLN (PREMIX)
5.0000 mg/h | INTRAVENOUS | Status: DC
  Administered 2021-08-12: 5 mg/h via INTRAVENOUS
  Filled 2021-08-12: qty 125

## 2021-08-12 MED ORDER — POLYETHYLENE GLYCOL 3350 17 G PO PACK
17.0000 g | PACK | Freq: Every day | ORAL | Status: DC
  Administered 2021-08-12 – 2021-08-16 (×3): 17 g via ORAL
  Filled 2021-08-12 (×5): qty 1

## 2021-08-12 MED ORDER — ONDANSETRON HCL 4 MG PO TABS
4.0000 mg | ORAL_TABLET | Freq: Four times a day (QID) | ORAL | Status: DC | PRN
  Administered 2021-08-13: 4 mg via ORAL
  Filled 2021-08-12: qty 1

## 2021-08-12 MED ORDER — METOPROLOL TARTRATE 25 MG PO TABS
25.0000 mg | ORAL_TABLET | Freq: Once | ORAL | Status: AC
  Administered 2021-08-12: 25 mg via ORAL
  Filled 2021-08-12: qty 1

## 2021-08-12 MED ORDER — TAMSULOSIN HCL 0.4 MG PO CAPS
0.4000 mg | ORAL_CAPSULE | Freq: Every day | ORAL | Status: DC
Start: 2021-08-12 — End: 2021-08-16
  Administered 2021-08-12 – 2021-08-15 (×4): 0.4 mg via ORAL
  Filled 2021-08-12 (×5): qty 1

## 2021-08-12 NOTE — ED Notes (Addendum)
Call to [facility] to notify of admission, spoke to [place] [phone]

## 2021-08-12 NOTE — Assessment & Plan Note (Signed)
Calculated BMI is 31,9.

## 2021-08-12 NOTE — Assessment & Plan Note (Addendum)
Hyponatremia, hypokalemia.   Patient underwent renal replacement therapy with good toleration. A the time of his discharge no clinical signs of volume overload.   For metabolic bone disease continue with sevelamer.

## 2021-08-12 NOTE — Assessment & Plan Note (Addendum)
Patient admitted to the step down unit and placed on IV diltiazem for rate control. After PRBC transfusion he converted back to sinus rhythm and diltiazem was discontinued.  Dose of metoprolol has been increased to 50 mg po bid for better rate control atrial fibrillation. Continue anticoagulation with apixaban.

## 2021-08-12 NOTE — Progress Notes (Signed)
Patient presented for 2 units of blood today. Vitals at admission were  BP 73/42, pulse 164. Afebrile.   Nurse notified PA, instructed to send to the ED. Report called to Tana Coast RN.   Transported with oxygen is wheelchair in stable condition to the ED. Sitter with pt, pt is from a facility.

## 2021-08-12 NOTE — Assessment & Plan Note (Addendum)
Anemia of chronic renal disease.  Patient had a total of 2 units PRBC transfusion during his hospitalization with good toleration.  Further work up with iron panel showed serum iron 63, TIBC 251, transferrin saturation 25 and ferritin 237. Consistent with anemia of chronic disease.  Follow as outpatient for EPO injections.

## 2021-08-12 NOTE — H&P (Signed)
History and Physical    Patient: Danny [name] Danny [name] DOB: July 18, 1955 DOA: 08/12/2021 DOS: the patient was seen and examined on 08/12/2021 PCP: Celene Squibb, MD  Patient coming from: SNF  Chief Complaint: weakness and palpitations, worsening anemia  Chief Complaint  Patient presents with   Anemia    Cancer center    HPI: Danny [name] is a 66 y.o. male with medical history significant of ESRD on HD, anemia of chronic kidney disease, paroxysmal atrial fibrillation, Q2VZ, HTN, diastolic heart failure and bilateral foot ulcers who presented with worsening weakness and palpitations.   Patient has been diagnosed with anemia and was instructed to follow up with the cancer center for outpatient PRBC transfusion. He has not been feeling well for few months, but lately over 2 weeks has more severe symptoms, of easy fatigability and generalized weakness, this am he had dyspnea with minimal efforts associated with palpitations. He went to the cancer center for PRBC transfusion, he was found in atrial fibrillation and having severe anemia, then he was transferred to the ED for further evaluation.   His Hgb was 6,4 on 02/15. No evidence of bleeding. 02/15 follow up with vascular surgery and deemed stable to use left upper extremity fistula for hemodialysis.  Patient currently uses a wheelchair for mobility.    Review of Systems: As mentioned in the history of present illness. All other systems reviewed and are negative. Past Medical History:  Diagnosis Date   Anemia    Ankle ulcer ([place])    Arthritis    CKD (chronic kidney disease), stage V ([place])    Diabetes mellitus without complication ([place])    Diastolic congestive heart failure ([facility])    Foot ulcer ([place])    Gout    Hypertension    Urinary retention    Past Surgical History:  Procedure Laterality Date   ANKLE SURGERY Right    AV FISTULA PLACEMENT Left 04/26/2021   Procedure: LEFT ARM ARTERIOVENOUS (AV) FISTULA CREATION  (BRACHIOCEPHALIC);  Surgeon: Rosetta Posner, MD;  Location: [facility];  Service: Vascular;  Laterality: Left;   CHOLECYSTECTOMY     FISTULA SUPERFICIALIZATION Left 07/12/2021   Procedure: LEFT ARM ARTERIOVENOUS FISTULA SUPERFICIALIZATION;  Surgeon: Rosetta Posner, MD;  Location: [facility];  Service: Vascular;  Laterality: Left;   FOOT SURGERY Right    INSERTION OF DIALYSIS CATHETER Right 01/14/2021   Procedure: INSERTION OF TUNNELED DIALYSIS CATHETER RIGHT INTERNAL JUGULAR;  Surgeon: Virl Cagey, MD;  Location: [facility];  Service: General;  Laterality: Right;   TRANSURETHRAL RESECTION OF PROSTATE N/A 01/15/2020   Procedure: TRANSURETHRAL RESECTION OF THE PROSTATE (TURP)  with General anesthesia and spinal;  Surgeon: Cleon Gustin, MD;  Location: [facility];  Service: Urology;  Laterality: N/A;   Social History:  reports that he quit smoking about 34 years ago. His smoking use included cigarettes. He has a 15.00 pack-year smoking history. He has never used smokeless tobacco. He reports that he does not currently use alcohol. He reports that he does not use drugs.  Allergies  Allergen Reactions   Dust Mite Extract Itching and Other (See Comments)    Unknown reaction-potential shortness of breath   Prednisone Nausea And Vomiting   Rocephin [Ceftriaxone] Nausea And Vomiting    Family History  Problem Relation Age of Onset   Diabetes Mother    Heart attack Mother    Heart attack Father    Diabetes Brother     Prior to Admission medications  Medication Sig Start Date End Date Taking? Authorizing Provider  acetaminophen (TYLENOL) 500 MG tablet Take 1,000 mg by mouth every 6 (six) hours as needed for moderate pain or headache.   Yes [provider]  cholecalciferol (VITAMIN D3) 25 MCG (1000 UNIT) tablet Take by mouth daily.   Yes [provider]  docusate sodium (COLACE) 100 MG capsule Take 100 mg by mouth 2 (two) times daily.   Yes [provider]  fluticasone  (FLONASE) 50 MCG/ACT nasal spray Place 2 sprays into both nostrils daily. 06/01/21  Yes [provider]  Insulin NPH Isophane & Regular (HUMULIN 70/30 KWIKPEN [place]) Inject 5 Units into the skin 2 (two) times daily.   Yes [provider]  Lactulose 20 GM/30ML SOLN Take 30 mLs (20 g total) by mouth daily. Patient taking differently: Take 30 mLs by mouth 2 (two) times daily. 11/11/20  Yes [name], MD  metoprolol succinate (TOPROL-XL) 25 MG 24 hr tablet Take 1 tablet (25 mg total) by mouth in the morning and at bedtime. 01/27/21  Yes Kathie Dike, MD  midodrine (PROAMATINE) 10 MG tablet Take 1 tablet by mouth. Every Harrell Lark and Saturdays 08/06/21  Yes [provider]  Multiple Vitamin (MULTIVITAMIN WITH MINERALS) TABS tablet Take 1 tablet by mouth daily.   Yes [provider]  Nutritional Supplements (FEEDING SUPPLEMENT, NEPRO CARB STEADY,) LIQD Take 237 mLs by mouth 2 (two) times daily between meals. 11/05/20  Yes Barton Dubois, MD  Omega-3 Fatty Acids (FISH OIL PO) Take 500 mg by mouth 2 (two) times daily.   Yes [provider]  ondansetron (ZOFRAN) 4 MG tablet Take 4 mg by mouth every 6 (six) hours. Prn.   Yes [provider]  oxycodone (OXY-IR) 5 MG capsule Take 5 mg by mouth every 6 (six) hours as needed.   Yes [provider]  polyethylene glycol (MIRALAX / GLYCOLAX) 17 g packet Take 17 g by mouth daily. 01/28/21  Yes Kathie Dike, MD  rosuvastatin (CRESTOR) 5 MG tablet Take 5 mg by mouth daily.   Yes [provider]  senna (SENOKOT) 8.6 MG tablet Take 1 tablet by mouth 2 (two) times daily.   Yes [provider]  sevelamer carbonate (RENVELA) 800 MG tablet Take 1 tablet (800 mg total) by mouth 2 (two) times daily with a meal. 01/27/21  Yes Kathie Dike, MD  sodium phosphate Pediatric (FLEET) 3.5-9.5 GM/59ML enema Place 1 enema rectally once as needed for severe constipation.   Yes [provider]   vitamin B-12 (CYANOCOBALAMIN) 1000 MCG tablet Take 1,000 mcg by mouth daily.   Yes [provider]  vitamin E 1000 UNIT capsule Take 1,000 Units by mouth daily.   Yes [provider]  XARELTO 20 MG TABS tablet Take 20 mg by mouth daily. 05/05/21  Yes [provider]  Zinc 50 MG TABS Take 250 mg by mouth daily.   Yes [provider]  apixaban (ELIQUIS) 5 MG TABS tablet Take 1 tablet (5 mg total) by mouth 2 (two) times daily. Patient not taking: Reported on 08/12/2021 01/27/21   Kathie Dike, MD  collagenase [facility]) ointment Apply to right lateral malleolus once daily; apply in a 1/8 inch layer and top with saline moistened gauze 2X2. Patient not taking: Reported on 08/12/2021 11/05/20   Barton Dubois, MD  polyethylene glycol-electrolytes (TRILYTE) 420 g solution Take 4,000 mLs by mouth as directed. Patient not taking: Reported on 08/12/2021 04/26/21   Harvel Quale, MD  tamsulosin (FLOMAX) 0.4 MG CAPS capsule Take 1 capsule (0.4 mg total) by mouth daily after supper. Patient not taking: Reported on 08/12/2021 03/11/21   Cleon Gustin, MD    Physical Exam: Vitals:   08/12/21 1500 08/12/21 1531 08/12/21 1630 08/12/21 1720  BP: 133/76 (!) 142/89 (!) 146/89 135/64  Pulse: 68 74 80 67  Resp: 16 16 16 14   Temp:  98.4 F (36.9 C) 98.1 F (36.7 C) 97.7 F (36.5 C)  TempSrc:  Oral Oral Oral  SpO2: 100% 98% 100% 96%   Neurology awake and alert ENT with pallor  Cardiovascular with S1 and S2 present rhythmic with no gallops or murmurs, no rubs (patient has converted to sinus rhythm after PRBC transfusion and short term IV diltiazem) No JVD No lower extremity edema Respiratory with no rales or wheezing, no rhonchi Abdomen soft and non tender Lower extremities with bilateral ulcerated wounds stage 2.    Data Reviewed:   Mr. Bartles will be admitted to the hospital with the working diagnosis of atrial fibrillation with rapid ventricular  response in the setting of worsening symptomatic anemia.   66 yo male with past medical history of paroxysmal atrial fibrillation, ESRD and anemia of chronic renal disease who presented with worsening weakness and easy fatigability. On his initial physical examination he was in atrial fibrillation with rapid ventricular response, with BP 122/79, HR 154, RR 18 and oxygen saturation 92%. Mild pallor, irregularly irregular S1 and S2 with no gallops or rubs, no murmurs, lungs with no wheezing or rhonchi, abdomen soft and no lower extremity edema.   Na 134, K 3,4, CL 99, bicarbonate 25. Glucose 151, bun 32 and cr 3,52 BNP 466  Wbc 7,6, hgb 7,8, hct 24,3 and plt 272  Sars covid 19 negative   Chest radiograph with small left pleural effusion with no other significant infiltrates.   EKG 160 bpm, normal axis with normal qtc, atrial fibrillation rhythm, with ST depression in V2 to V5 with no significant T wave changes.    Assessment and Plan: * Atrial fibrillation ([place])- (present on admission) Patient has converted to sinus rhythm after PRBC transfusion and short course of IV diltiazem.  Plan to continue telemetry monitoring.  Increase metoprolol to 50 mg po bid.  Continue anticoagulation with apixaban.  Further work up with echocardiogram.   Symptomatic anemia- (present on admission) Anemia of chronic renal disease.  SP one unit PRBC transfusion.  Plan to follow up hgb in am if continue to below 8, will plan to transfuse 2nd units during hemodialysis  Check Iron stores, patient likely candidate for EPO and iron infusions.   End stage renal disease ([place])- (present on admission) Patient on renal replacement therapy [place], Thus and Sat.  Clinically euvolemic today, his K is not elevated and has no acidosis. His left upper extremity fistula is mature.  Consult nephrology for inpatient renal replacement therapy.   Chronic ulcer of right ankle ([place])- (present on admission) Lower extremity wound  are stable and chronic, no clinical signs of local infection. Continue loca wound care and hold on systemic antibiotic therapy.   Type 2 diabetes mellitus with hyperlipidemia ([place])- (present on admission) Continue glucose cover and monitoring with insulin sliding scale. Follow up capillary glucose.   Class 1 obesity- (present on admission) Calculated BMI is 31,9.     Advance Care Planning:   Code Status: DNR   Consults: nephrology   Family Communication: I spoke over the phone with the patient's sister  about  patient's  condition, plan of care, prognosis and all questions were addressed.   Severity of Illness: The appropriate patient status for this patient is INPATIENT. Inpatient status is judged to be reasonable and necessary in order to provide the required intensity of service to ensure the patient's safety. The patient's presenting symptoms, physical exam findings, and initial radiographic and laboratory data in the context of their chronic comorbidities is felt to place them at high risk for further clinical deterioration. Furthermore, it is not anticipated that the patient will be medically stable for discharge from the hospital within 2 midnights of admission.   * I certify that at the point of admission it is my clinical judgment that the patient will require inpatient hospital care spanning beyond 2 midnights from the point of admission due to high intensity of service, high risk for further deterioration and high frequency of surveillance required.*  Author: Tawni Millers, MD 08/12/2021 5:43 PM  For on call review [email or web address].

## 2021-08-12 NOTE — ED Triage Notes (Signed)
Pt sent by cancer center to receive blood transfusion, increased HR 160s

## 2021-08-12 NOTE — Assessment & Plan Note (Signed)
Lower extremity wound are stable and chronic, no clinical signs of local infection. Continue loca wound care and hold on systemic antibiotic therapy.

## 2021-08-12 NOTE — Assessment & Plan Note (Addendum)
Patient with episodic hyperglycemia. Plan to continue insulin therapy and close follow up as outpatient. Continue with life style modifications.

## 2021-08-12 NOTE — ED Provider Notes (Signed)
[facility] EMERGENCY DEPARTMENT Provider Note   CSN: [id number] Arrival date & time: 08/12/21  0856     History  Chief Complaint  Patient presents with   Anemia    Cancer center    Danny [name] is a 66 y.o. male.  HPI  Patient with medical history including diabetes, end-stage renal disease on dialysis Tuesday Thursday Saturday hypertension anemia due to chronic diseases CHF, A-fib currently on Eliquis presents the emergency department with complaints of needing a blood transfusion.  Patient states that he went to his oncologist appointment today where he was supposed to be transfused 2 units of blood, on arrival he was noted to be tachycardic and hypotensive and was sent here for further evaluation.  Patient states that starting today he just does not feel well, he states he feels weak and short of breath especially with exertion, he has no actual chest pain, denies pleuritic chest pain, no orthopnea or worsening peripheral edema, got no history of PEs or DVTs, he is currently on Eliquis, patient has a history of ACS.  Patient states that he went to his dialysis treatment yesterday and got the full treatment, he does endorse subjective fevers and chills without congestion, cough, general body aches, states he still tolerating p.o., has no other complaints.  Reviewed patient's chart was last seen by his cardiologist 1 year ago no mention of A-fib or anticoag use, recently admitted last year 07/20 started on dialysis and was noted to be on Eliquis due to A-fib.  EF at that time was obtained shows an EF of 55 to 60%.  Oncology note reveals patient is receiving EPO during his dialysis visits, is scheduled for an endoscopy as well as colonoscopy this patient had positive Hemoccult back in June 2022  Home Medications Prior to Admission medications   Medication Sig Start Date End Date Taking? Authorizing Provider  acetaminophen (TYLENOL) 500 MG tablet Take 1,000 mg by mouth every 6 (six) hours  as needed for moderate pain or headache.   Yes [provider]  cholecalciferol (VITAMIN D3) 25 MCG (1000 UNIT) tablet Take by mouth daily.   Yes [provider]  docusate sodium (COLACE) 100 MG capsule Take 100 mg by mouth 2 (two) times daily.   Yes [provider]  fluticasone (FLONASE) 50 MCG/ACT nasal spray Place 2 sprays into both nostrils daily. 06/01/21  Yes [provider]  Insulin NPH Isophane & Regular (HUMULIN 70/30 KWIKPEN [place]) Inject 5 Units into the skin 2 (two) times daily.   Yes [provider]  Lactulose 20 GM/30ML SOLN Take 30 mLs (20 g total) by mouth daily. Patient taking differently: Take 30 mLs by mouth 2 (two) times daily. 11/11/20  Yes [name], MD  metoprolol succinate (TOPROL-XL) 25 MG 24 hr tablet Take 1 tablet (25 mg total) by mouth in the morning and at bedtime. 01/27/21  Yes Kathie Dike, MD  midodrine (PROAMATINE) 10 MG tablet Take 1 tablet by mouth. Every Harrell Lark and Saturdays 08/06/21  Yes [provider]  Multiple Vitamin (MULTIVITAMIN WITH MINERALS) TABS tablet Take 1 tablet by mouth daily.   Yes [provider]  Nutritional Supplements (FEEDING SUPPLEMENT, NEPRO CARB STEADY,) LIQD Take 237 mLs by mouth 2 (two) times daily between meals. 11/05/20  Yes Barton Dubois, MD  Omega-3 Fatty Acids (FISH OIL PO) Take 500 mg by mouth 2 (two) times daily.   Yes [provider]  ondansetron (ZOFRAN) 4 MG tablet Take 4 mg by mouth  every 6 (six) hours. Prn.   Yes [provider]  oxycodone (OXY-IR) 5 MG capsule Take 5 mg by mouth every 6 (six) hours as needed.   Yes [provider]  polyethylene glycol (MIRALAX / GLYCOLAX) 17 g packet Take 17 g by mouth daily. 01/28/21  Yes Kathie Dike, MD  rosuvastatin (CRESTOR) 5 MG tablet Take 5 mg by mouth daily.   Yes [provider]  senna (SENOKOT) 8.6 MG tablet Take 1 tablet by mouth 2 (two) times daily.   Yes [provider]  sevelamer carbonate (RENVELA) 800 MG tablet Take 1 tablet (800 mg total) by mouth 2 (two) times daily with a meal. 01/27/21  Yes Kathie Dike, MD  sodium phosphate Pediatric (FLEET) 3.5-9.5 GM/59ML enema Place 1 enema rectally once as needed for severe constipation.   Yes [provider]  vitamin B-12 (CYANOCOBALAMIN) 1000 MCG tablet Take 1,000 mcg by mouth daily.   Yes [provider]  vitamin E 1000 UNIT capsule Take 1,000 Units by mouth daily.   Yes [provider]  XARELTO 20 MG TABS tablet Take 20 mg by mouth daily. 05/05/21  Yes [provider]  Zinc 50 MG TABS Take 250 mg by mouth daily.   Yes [provider]  apixaban (ELIQUIS) 5 MG TABS tablet Take 1 tablet (5 mg total) by mouth 2 (two) times daily. Patient not taking: Reported on 08/12/2021 01/27/21   Kathie Dike, MD  collagenase [facility]) ointment Apply to right lateral malleolus once daily; apply in a 1/8 inch layer and top with saline moistened gauze 2X2. Patient not taking: Reported on 08/12/2021 11/05/20   Barton Dubois, MD  polyethylene glycol-electrolytes (TRILYTE) 420 g solution Take 4,000 mLs by mouth as directed. Patient not taking: Reported on 08/12/2021 04/26/21   Montez Morita, Quillian Quince, MD  tamsulosin (FLOMAX) 0.4 MG CAPS capsule Take 1 capsule (0.4 mg total) by mouth daily after supper. Patient not taking: Reported on 08/12/2021 03/11/21   Cleon Gustin, MD      Allergies    Dust mite extract, Prednisone, and Rocephin [ceftriaxone]    Review of Systems   Review of Systems  Constitutional:  Positive for chills, fatigue and fever.  Respiratory:  Positive for shortness of breath. Negative for chest tightness.   Cardiovascular:  Negative for chest pain.  Gastrointestinal:  Negative for abdominal pain, diarrhea, nausea and vomiting.  Neurological:  Negative for headaches.   Physical Exam Updated Vital Signs BP (!) 155/85    Pulse 79    Temp 98.4 F  (36.9 C)    Resp 16    SpO2 96%  Physical Exam Vitals and nursing note reviewed.  Constitutional:      General: He is not in acute distress.    Appearance: He is ill-appearing.  HENT:     Head: Normocephalic and atraumatic.     Nose: No congestion.     Mouth/Throat:     Mouth: Mucous membranes are dry.     Pharynx: Oropharynx is clear. No oropharyngeal exudate or posterior oropharyngeal erythema.  Eyes:     Extraocular Movements: Extraocular movements intact.     Conjunctiva/sclera: Conjunctivae normal.  Cardiovascular:     Rate and Rhythm: Regular rhythm. Tachycardia present.     Pulses: Normal pulses.     Heart sounds: No murmur heard.   No friction rub. No gallop.  Pulmonary:     Effort: No respiratory distress.     Breath sounds: No wheezing, rhonchi or  rales.     Comments: Has a noted port in the right upper chest, no surrounding erythema or edema no drainage or discharge present. Abdominal:     Palpations: Abdomen is soft.     Tenderness: There is no abdominal tenderness. There is no right CVA tenderness or left CVA tenderness.  Musculoskeletal:     Comments: Bilateral pedal edema 1+ up to the shins, no unilateral leg swelling, chronic lymphedema skin changes present, he has ulcers noted on the lateral malleolus of both legs, with slight surrounding erythema, no drainage or discharge present, sees please see picture for full detail.  Skin:    General: Skin is warm and dry.     Comments: Fistula on the left arm, good palpable thrill, no overlying skin changes.  Neurological:     Mental Status: He is alert.  Psychiatric:        Mood and Affect: Mood normal.       ED Results / Procedures / Treatments   Labs (all labs ordered are listed, but only abnormal results are displayed) Labs Reviewed  COMPREHENSIVE METABOLIC PANEL - Abnormal; Notable for the following components:      Result Value   Sodium 134 (*)    Potassium 3.4 (*)    Glucose, Bld 151 (*)    BUN 32 (*)     Creatinine, Ser 3.52 (*)    Calcium 8.1 (*)    Albumin 2.4 (*)    Alkaline Phosphatase 129 (*)    GFR, Estimated 18 (*)    All other components within normal limits  CBC WITH DIFFERENTIAL/PLATELET - Abnormal; Notable for the following components:   RBC 2.62 (*)    Hemoglobin 7.8 (*)    HCT 24.3 (*)    RDW 18.7 (*)    All other components within normal limits  BRAIN NATRIURETIC PEPTIDE - Abnormal; Notable for the following components:   B Natriuretic Peptide 466.0 (*)    All other components within normal limits  CULTURE, BLOOD (ROUTINE X 2)  CULTURE, BLOOD (ROUTINE X 2)  RESP PANEL BY RT-PCR (FLU A&B, COVID) ARPGX2  LACTIC ACID, PLASMA  MAGNESIUM  URINALYSIS, ROUTINE W REFLEX MICROSCOPIC  PREPARE RBC (CROSSMATCH)    EKG None  Radiology DG Chest Port 1 View  Result Date: 08/12/2021 CLINICAL DATA:  Heart palpitations in a 66 year old male. EXAM: PORTABLE CHEST 1 VIEW COMPARISON:  Comparison is made with March 13, 2021. FINDINGS: EKG leads project over the chest. RIGHT IJ dialysis catheter tip in the upper RIGHT atrium/caval to atrial junction based on projection. Cardiomediastinal contours remain enlarged. Improved aeration at the LEFT base till with mild LEFT basilar airspace disease. Mild blunting of LEFT costodiaphragmatic sulcus. No pneumothorax. On limited assessment there is no acute skeletal process. IMPRESSION: 1. Small LEFT effusion and basilar airspace disease appears slightly improved compared to imaging from September of 2022. 2. No acute findings. 3. Post insertion of dialysis catheter since previous chest x-ray. Electronically Signed   By: Zetta Bills M.D.   On: 08/12/2021 10:31    Procedures .Critical Care Performed by: Marcello Fennel, PA-C Authorized by: Marcello Fennel, PA-C   Critical care provider statement:    Critical care time (minutes):  30   Critical care was necessary to treat or prevent imminent or life-threatening deterioration of the  following conditions:  Cardiac failure and circulatory failure   Critical care was time spent personally by me on the following activities:  Discussions with consultants, evaluation of patient's  response to treatment, examination of patient, ordering and review of laboratory studies, ordering and review of radiographic studies, ordering and performing treatments and interventions, pulse oximetry, re-evaluation of patient's condition and review of old charts   I assumed direction of critical care for this patient from another provider in my specialty: no     Care discussed with: admitting provider      Medications Ordered in ED Medications  diltiazem (CARDIZEM) 125 mg in dextrose 5% 125 mL (1 mg/mL) infusion (5 mg/hr Intravenous New Bag/Given 08/12/21 1146)  sodium chloride 0.9 % bolus 1,000 mL (0 mLs Intravenous Stopped 08/12/21 1121)  metoprolol tartrate (LOPRESSOR) tablet 25 mg (25 mg Oral Given 08/12/21 1109)  0.9 %  sodium chloride infusion (10 mL/hr Intravenous New Bag/Given 08/12/21 1327)    ED Course/ Medical Decision Making/ A&P                           Medical Decision Making Amount and/or Complexity of Data Reviewed Labs: ordered. Radiology: ordered.  Risk Prescription drug management. Decision regarding hospitalization.   This patient presents to the ED for concern of hypotension needing blood transfusion, this involves an extensive number of treatment options, and is a complaint that carries with it a high risk of complications and morbidity.  The differential diagnosis includes sepsis, anemia, GI bleed    Additional history obtained:  Additional history obtained from the chart medical record External records from outside source obtained and reviewed including please see HPI for further detail   Co morbidities that complicate the patient evaluation  End-stage renal disease, chronic anemia, on anticoag's A-fib  Social Determinants of Health:  N/A    Lab  Tests:  I Ordered, and personally interpreted labs.  The pertinent results include: CBC shows normocytic anemia hemoglobin of 7.8 increased from 6.42 days ago, CMP shows sodium of 134 potassium 3.4 glucose 151 BUN 32 creatinine 3.5 calcium 8.1 albumin 2.4 alk phos 129 GFR 18, lactic 1.5 BNP 466   Imaging Studies ordered:  I ordered imaging studies including chest x-ray I independently visualized and interpreted imaging which showed small left pleural effusion, airspace disease slightly improved no acute abnormalities. I agree with the radiologist interpretation   Cardiac Monitoring:  The patient was maintained on a cardiac monitor.  I personally viewed and interpreted the cardiac monitored which showed an underlying rhythm of: Shows sinus tach and A-fib with RVR no evidence of ST elevation.   Medicines ordered and prescription drug management:  I ordered medication including fluids for dehydration I have reviewed the patients home medicines and have made adjustments as needed  Critical Interventions:  Blood transfusion for symptomatic anemia Diltiazem for A-fib with RVR   Reevaluation:  Patient is reassessed after liter of fluids, heart rate has come down slightly in the 140s but still remains tachycardic, febrile with home dose of metoprolol and reassess.  Patient is reassessed continues to be tachycardic up to 160s, will start him on a diltiazem drip and monitor.  We will also consult with cardiology for further recommendations.  Hemoglobin has improved from prior reading will consult with oncology to determine if blood transfusion is needed.  Updated patient recommendations from specialists, patient is agreement with admission at this time.  Patient reassessed after diltiazem heart rate has improved in the 120s blood pressures remained stable.  Consultations Obtained:  I requested consultation with oncology Dr. Delton Coombes,  and discussed lab and imaging findings as well  as  pertinent plan - they recommend: He states since patient is endorsing dyspnea on exertion and feeling fatigue may provide patient with a unit of blood. Spoke with cardiology Dr. Harrington Challenger feels this is likely multifactorial A-fib supply from anemia and should resolve once patient is more stabilized, she is agreement with current treatment plan of diltiazem and blood transfusion we will see him as needed. Spoke with Dr.arrien with admitting team and he has accepted him.    Rule out Low suspicion for systemic infection as patient not meet sepsis criteria, afebrile, nontachypneic, lactic within normal limits, no leukocytosis.  He is noted to be tachycardic but I suspect this more secondary due to symptomatic anemia.  I have low suspicion for GI bleed and he denies any melena or hematochezia, abdomen soft nontender to palpation, vital signs are reassuring.  Low suspicion patient will need emergent dialysis as there is no severe electrolyte derailments, no new oxygen requirements, no evidence of volume overloaded.  Low suspicion  for ACS as patient denies any chest pain or shortness of breath, EKG without signs of ischemia.    Dispostion and problem list  After consideration of the diagnostic results and the patients response to treatment, I feel that the patent would benefit from admission.  A-fib RVR-likely multifactorial, currently uncontrolled while diltiazem drip will need continued monitoring. Anemia-likely due to chronic diseases, receiving blood transfusion will need further monitoring.            Final Clinical Impression(s) / ED Diagnoses Final diagnoses:  Chronic atrial fibrillation (HCC)  Iron deficiency anemia, unspecified iron deficiency anemia type    Rx / DC Orders ED Discharge Orders     None         Aron Baba 08/12/21 1352    Luna Fuse, MD 08/21/21 1257

## 2021-08-13 DIAGNOSIS — I480 Paroxysmal atrial fibrillation: Secondary | ICD-10-CM | POA: Diagnosis not present

## 2021-08-13 DIAGNOSIS — E669 Obesity, unspecified: Secondary | ICD-10-CM | POA: Diagnosis not present

## 2021-08-13 DIAGNOSIS — N186 End stage renal disease: Secondary | ICD-10-CM | POA: Diagnosis not present

## 2021-08-13 DIAGNOSIS — L97319 Non-pressure chronic ulcer of right ankle with unspecified severity: Secondary | ICD-10-CM | POA: Diagnosis not present

## 2021-08-13 LAB — CBC
HCT: 24.6 % — ABNORMAL LOW (ref 39.0–52.0)
HCT: 24.6 % — ABNORMAL LOW (ref 39.0–52.0)
Hemoglobin: 7.9 g/dL — ABNORMAL LOW (ref 13.0–17.0)
Hemoglobin: 7.9 g/dL — ABNORMAL LOW (ref 13.0–17.0)
MCH: 29.8 pg (ref 26.0–34.0)
MCH: 29.8 pg (ref 26.0–34.0)
MCHC: 32.1 g/dL (ref 30.0–36.0)
MCHC: 32.1 g/dL (ref 30.0–36.0)
MCV: 92.8 fL (ref 80.0–100.0)
MCV: 92.8 fL (ref 80.0–100.0)
Platelets: 263 10*3/uL (ref 150–400)
Platelets: 263 10*3/uL (ref 150–400)
RBC: 2.65 MIL/uL — ABNORMAL LOW (ref 4.22–5.81)
RBC: 2.65 MIL/uL — ABNORMAL LOW (ref 4.22–5.81)
RDW: 18 % — ABNORMAL HIGH (ref 11.5–15.5)
RDW: 18 % — ABNORMAL HIGH (ref 11.5–15.5)
WBC: 8.6 10*3/uL (ref 4.0–10.5)
WBC: 8.6 10*3/uL (ref 4.0–10.5)
nRBC: 0 % (ref 0.0–0.2)
nRBC: 0 % (ref 0.0–0.2)

## 2021-08-13 LAB — RENAL FUNCTION PANEL
Albumin: 2.3 g/dL — ABNORMAL LOW (ref 3.5–5.0)
Anion gap: 10 (ref 5–15)
BUN: 39 mg/dL — ABNORMAL HIGH (ref 8–23)
CO2: 24 mmol/L (ref 22–32)
Calcium: 8.2 mg/dL — ABNORMAL LOW (ref 8.9–10.3)
Chloride: 98 mmol/L (ref 98–111)
Creatinine, Ser: 4.07 mg/dL — ABNORMAL HIGH (ref 0.61–1.24)
GFR, Estimated: 15 mL/min — ABNORMAL LOW (ref >60)
Glucose, Bld: 221 mg/dL — ABNORMAL HIGH (ref 70–99)
Phosphorus: 5.7 mg/dL — ABNORMAL HIGH (ref 2.5–4.6)
Potassium: 4 mmol/L (ref 3.5–5.1)
Sodium: 132 mmol/L — ABNORMAL LOW (ref 135–145)

## 2021-08-13 LAB — BASIC METABOLIC PANEL
Anion gap: 8 (ref 5–15)
BUN: 39 mg/dL — ABNORMAL HIGH (ref 8–23)
CO2: 24 mmol/L (ref 22–32)
Calcium: 8.1 mg/dL — ABNORMAL LOW (ref 8.9–10.3)
Chloride: 99 mmol/L (ref 98–111)
Creatinine, Ser: 4.12 mg/dL — ABNORMAL HIGH (ref 0.61–1.24)
GFR, Estimated: 15 mL/min — ABNORMAL LOW (ref >60)
Glucose, Bld: 220 mg/dL — ABNORMAL HIGH (ref 70–99)
Potassium: 4 mmol/L (ref 3.5–5.1)
Sodium: 131 mmol/L — ABNORMAL LOW (ref 135–145)

## 2021-08-13 LAB — HEPATITIS B SURFACE ANTIBODY,QUALITATIVE: Hep B S Ab: NONREACTIVE

## 2021-08-13 LAB — TRANSFERRIN: Transferrin: 179 mg/dL — ABNORMAL LOW (ref 180–329)

## 2021-08-13 LAB — FERRITIN: Ferritin: 237 ng/mL (ref 24–336)

## 2021-08-13 LAB — IRON AND TIBC
Iron: 63 ug/dL (ref 45–182)
Saturation Ratios: 25 % (ref 17.9–39.5)
TIBC: 251 ug/dL (ref 250–450)
UIBC: 188 ug/dL

## 2021-08-13 LAB — GLUCOSE, CAPILLARY: Glucose-Capillary: 213 mg/dL — ABNORMAL HIGH (ref 70–99)

## 2021-08-13 LAB — HEPATITIS B SURFACE ANTIGEN: Hepatitis B Surface Ag: NONREACTIVE

## 2021-08-13 MED ORDER — BISACODYL 5 MG PO TBEC
5.0000 mg | DELAYED_RELEASE_TABLET | Freq: Every day | ORAL | Status: DC | PRN
  Administered 2021-08-13 – 2021-08-15 (×3): 5 mg via ORAL
  Filled 2021-08-13 (×3): qty 1

## 2021-08-13 MED ORDER — SODIUM CHLORIDE 0.9 % IV SOLN: 100.0000 mL | INTRAVENOUS | Status: DC | PRN

## 2021-08-13 MED ORDER — SODIUM CHLORIDE 0.9% IV SOLUTION: Freq: Once | INTRAVENOUS | Status: AC

## 2021-08-13 MED ORDER — CHLORHEXIDINE GLUCONATE CLOTH 2 % EX PADS
6.0000 | MEDICATED_PAD | Freq: Every day | CUTANEOUS | Status: DC
  Administered 2021-08-13 – 2021-08-15 (×3): 6 via TOPICAL

## 2021-08-13 MED ORDER — ALTEPLASE 2 MG IJ SOLR: 2.0000 mg | Freq: Once | INTRAMUSCULAR | Status: DC | PRN

## 2021-08-13 MED ORDER — HEPARIN SODIUM (PORCINE) 1000 UNIT/ML DIALYSIS
1000.0000 [IU] | INTRAMUSCULAR | Status: DC | PRN
  Administered 2021-08-13 – 2021-08-16 (×2): 3800 [IU] via INTRAVENOUS_CENTRAL

## 2021-08-13 NOTE — Progress Notes (Signed)
Arrived from ICU patient made comfortable and settled in bed. Oriented to room and call light. Provided with water on bedside table within reach. Will continue to monitor patient.

## 2021-08-13 NOTE — Hospital Course (Addendum)
Danny [name] will be admitted to the hospital with the working diagnosis of atrial fibrillation with rapid ventricular response in the setting of worsening symptomatic anemia.    66 yo male with past medical history of paroxysmal atrial fibrillation, ESRD and anemia of chronic renal disease who presented with worsening weakness and easy fatigability. On his initial physical examination he was in atrial fibrillation with rapid ventricular response, with BP 122/79, HR 154, RR 18 and oxygen saturation 92%. Mild pallor, irregularly irregular S1 and S2 with no gallops or rubs, no murmurs, lungs with no wheezing or rhonchi, abdomen soft and no lower extremity edema.    Na 134, K 3,4, CL 99, bicarbonate 25. Glucose 151, bun 32 and cr 3,52 BNP 466  Wbc 7,6, hgb 7,8, hct 24,3 and plt 272  Sars covid 19 negative    Chest radiograph with small left pleural effusion with no other significant infiltrates.    EKG 160 bpm, normal axis with normal qtc, atrial fibrillation rhythm, with ST depression in V2 to V5 with no significant T wave changes.    Patient was placed on diltiazem drip and received one unit PRBC transfusion. Rate improved and wean off diltiazem drip.  Increased dose of oral metoprolol with good toleration.  Consulted nephrology for routine renal replacement therapy.   02/18 renal replacement therapy and received an additional unit PRBC transfusion for good toleration.  Patient will be transferred back to SNF to continue physical therapy.

## 2021-08-13 NOTE — Consult Note (Signed)
ESRD Consult Note   Assessment/Recommendations:   # ESRD:  -outpatient orders: [facility], TTS, 4 hours.  EDW 109.5 kg, Revaclear 300, 400/500, 2K, 2.5 calcium.  UF profile 2.  Meds: Mircera 200 mcg every 2 weeks (last dose on the week of 2/13) -HD today on schedule, next HD planned for tues 2/21 if still here  # Afib w/ RVR -converted to SR after short course of diltiazem gtt and PRBC -a/c: epiquis -echo pending -per pmd  # Volume/ hypertension: EDW 109.5kg. UF  as tolerated  # Symptomatic anemia, Anemia of Chronic Kidney Disease: Hemoglobin 6.8 on presentation. S/p 1u prbc so far. Currently receiving mircers 200 q2weeks-just received dose, not due for it yet. Rule out bleed?-per pmd -CBC w/ HD today  # Secondary Hyperparathyroidism/Hyperphosphatemia: resume home binders, check phos. Not on vdra as an outpatient  # DM2 w/ hyperglycemia -per primary service   # MGUS -follows with [facility] as an outpatient  # Vascular access: TDC in place, AVF ready to use--will utilize [facility] today, can consider trying to stick AVF  # Additional recommendations: - Dose all meds for creatinine clearance < 10 ml/min  - Unless absolutely necessary, no MRIs with gadolinium.  - Implement save arm precautions.  Prefer needle sticks in the dorsum of the hands or wrists.  No blood pressure measurements in arm. - If blood transfusion is requested during hemodialysis sessions, please alert [place] prior to the session.  - If a hemodialysis catheter line culture is requested, please alert [place] as only hemodialysis nurses are able to collect those specimens.   Recommendations were discussed with the primary team.  Gean Quint, MD [facility]  History of Present Illness: Danny [name] is a/an 66 y.o. male with a past medical history of ESRD on HD, chronic anemia, paroxysmal A-fib, DM 2, MGUS, diastolic heart failure who presents with worsening weakness and fatigability was found to be in A-fib with  RVR with a hemoglobin of 6.4.  A-fib with RVR converted to sinus rhythm with a short course of diltiazem drip 1 unit PRBCs.  Was initially hypotensive with blood pressure 73/42 which has improved and he remains hemodynamically stable.  Dialyzes at [facility], aVF is ready to use as of 2/15, today was was supposed to be the first day his outpatient dialysis unit was going to utilize his aVF as per his outpatient clinic. He reports that his HD has been going well, sometimes they pull too much fluid. Feels better as compared to yesterday, has a little more energy. Was constipated earlier, s/p disimpaction. No other complaints   Medications:  Current Facility-Administered Medications  Medication Dose Route Frequency Provider Last Rate Last Admin   acetaminophen (TYLENOL) tablet 650 mg  650 mg Oral Q6H PRN Arrien, Jimmy Picket, MD   650 mg at 08/13/21 0138   Or   acetaminophen (TYLENOL) suppository 650 mg  650 mg Rectal Q6H PRN Arrien, Jimmy Picket, MD       apixaban Arne [place]) tablet 5 mg  5 mg Oral BID Tawni Millers, MD   5 mg at 08/13/21 0848   ascorbic acid (VITAMIN C) tablet 1,000 mg  1,000 mg Oral Daily Tawni Millers, MD   1,000 mg at 08/13/21 0847   bisacodyl (DULCOLAX) EC tablet 5 mg  5 mg Oral Daily PRN Zierle-Ghosh, Asia B, DO   5 mg at 08/13/21 0853   Chlorhexidine Gluconate Cloth 2 % PADS 6 each  6 each Topical Q0600 Arrien, Jimmy Picket,  MD   6 each at 08/13/21 0850   Chlorhexidine Gluconate Cloth 2 % PADS 6 each  6 each Topical Q0600 Gean Quint, MD       ferrous sulfate tablet 325 mg  325 mg Oral BID WC Arrien, Jimmy Picket, MD   325 mg at 08/13/21 0847   metoprolol tartrate (LOPRESSOR) tablet 50 mg  50 mg Oral BID Tawni Millers, MD   50 mg at 08/13/21 0847   mupirocin ointment (BACTROBAN) 2 % 1 application  1 application Nasal BID Arrien, Jimmy Picket, MD   1 application at 09/32/67 0850   ondansetron (ZOFRAN) tablet 4 mg  4 mg Oral Q6H PRN  Arrien, Jimmy Picket, MD       Or   ondansetron [facility]) injection 4 mg  4 mg Intravenous Q6H PRN Arrien, Jimmy Picket, MD       oxyCODONE (Oxy IR/ROXICODONE) immediate release tablet 5 mg  5 mg Oral Q6H PRN Arrien, Jimmy Picket, MD       polyethylene glycol (MIRALAX / GLYCOLAX) packet 17 g  17 g Oral Daily Tawni Millers, MD   17 g at 08/13/21 0848   rosuvastatin (CRESTOR) tablet 5 mg  5 mg Oral Daily Arrien, Jimmy Picket, MD   5 mg at 08/13/21 0847   tamsulosin (FLOMAX) capsule 0.4 mg  0.4 mg Oral QPC supper Arrien, Jimmy Picket, MD   0.4 mg at 08/12/21 1827   vitamin B-12 (CYANOCOBALAMIN) tablet 1,000 mcg  1,000 mcg Oral Daily Tawni Millers, MD   1,000 mcg at 08/13/21 0848     ALLERGIES Dust mite extract, Prednisone, and Rocephin [ceftriaxone]  MEDICAL HISTORY Past Medical History:  Diagnosis Date   Anemia    Ankle ulcer ([place])    Arthritis    CKD (chronic kidney disease), stage V ([place])    Diabetes mellitus without complication ([place])    Diastolic congestive heart failure ([place])    Foot ulcer ([place])    Gout    Hypertension    Urinary retention      SOCIAL HISTORY Social History   Socioeconomic History   Marital status: Divorced    Spouse name: Not on file   Number of children: Not on file   Years of education: Not on file   Highest education level: Not on file  Occupational History   Not on file  Tobacco Use   Smoking status: Former    Packs/day: 1.50    Years: 10.00    Pack years: 15.00    Types: Cigarettes    Quit date: 06/02/1987    Years since quitting: 34.2   Smokeless tobacco: Never  Vaping Use   Vaping Use: Never used  Substance and Sexual Activity   Alcohol use: Not Currently   Drug use: Never   Sexual activity: Not Currently  Other Topics Concern   Not on file  Social History Narrative   Not on file   Social Determinants of Health   Financial Resource Strain: Low Risk    Difficulty of Paying Living Expenses: Not  very hard  Food Insecurity: No Food Insecurity   Worried About Running Out of Food in the Last Year: Never true   Ran Out of Food in the Last Year: Never true  Transportation Needs: No Transportation Needs   Lack of Transportation (Medical): No   Lack of Transportation (Non-Medical): No  Physical Activity: Inactive   Days of Exercise per Week: 0 days   Minutes of Exercise per Session:  0 min  Stress: No Stress Concern Present   Feeling of Stress : Only a little  Social Connections: Not on file  Intimate Partner Violence: Not At Risk   Fear of Current or Ex-Partner: No   Emotionally Abused: No   Physically Abused: No   Sexually Abused: No     FAMILY HISTORY Family History  Problem Relation Age of Onset   Diabetes Mother    Heart attack Mother    Heart attack Father    Diabetes Brother      Review of Systems: 12 systems were reviewed and negative except per HPI  Physical Exam: Vitals:   08/13/21 1000 08/13/21 1024  BP: (!) 135/95   Pulse: (!) 147 90  Resp: (!) 23 19  Temp:    SpO2: (!) 89% 98%   Total I/O In: 240 [P.O.:240] Out: -   Intake/Output Summary (Last 24 hours) at 08/13/2021 1034 Last data filed at 08/13/2021 0900 Gross per 24 hour  Intake 1872.79 ml  Output 1 ml  Net 1871.79 ml   General: well-appearing, no acute distress HEENT: anicteric sclera, MMM CV: s1s2, rrr Lungs: cta bl, bilateral chest rise, normal wob Abd: obese, soft, non-tender, non-distended Skin: no visible lesions or rashes Psych: alert, engaged, appropriate mood and affect Neuro: normal speech, no gross focal deficits  Dialysis access: RIJ TDC, LUE AVF +b/t  Test Results Reviewed Lab Results  Component Value Date   NA 134 (L) 08/12/2021   K 3.4 (L) 08/12/2021   CL 99 08/12/2021   CO2 25 08/12/2021   BUN 32 (H) 08/12/2021   CREATININE 3.52 (H) 08/12/2021   CALCIUM 8.1 (L) 08/12/2021   ALBUMIN 2.4 (L) 08/12/2021   PHOS 3.5 01/25/2021    I have reviewed relevant outside  healthcare records

## 2021-08-13 NOTE — Progress Notes (Signed)
Progress Note   Patient: Danny [name]:[id number] DOB: 13-Jul-1955 DOA: 08/12/2021     1 DOS: the patient was seen and examined on 08/13/2021   Brief hospital course: Danny [name] will be admitted to the hospital with the working diagnosis of atrial fibrillation with rapid ventricular response in the setting of worsening symptomatic anemia.    66 yo male with past medical history of paroxysmal atrial fibrillation, ESRD and anemia of chronic renal disease who presented with worsening weakness and easy fatigability. On his initial physical examination he was in atrial fibrillation with rapid ventricular response, with BP 122/79, HR 154, RR 18 and oxygen saturation 92%. Mild pallor, irregularly irregular S1 and S2 with no gallops or rubs, no murmurs, lungs with no wheezing or rhonchi, abdomen soft and no lower extremity edema.    Na 134, K 3,4, CL 99, bicarbonate 25. Glucose 151, bun 32 and cr 3,52 BNP 466  Wbc 7,6, hgb 7,8, hct 24,3 and plt 272  Sars covid 19 negative    Chest radiograph with small left pleural effusion with no other significant infiltrates.    EKG 160 bpm, normal axis with normal qtc, atrial fibrillation rhythm, with ST depression in V2 to V5 with no significant T wave changes.    Patient was placed on diltiazem drip and received one unit PRBC transfusion. Rate improved and wean off diltiazem drip.  Increased dose of oral metoprolol with good toleration.  Consulted nephrology for routine renal replacement therapy.   Assessment and Plan: * Atrial fibrillation ([name])- (present on admission) Patient has converted to sinus rhythm after PRBC transfusion and short course of IV diltiazem.  Patient continue rate control and sinus rhythm. Continue with metoprolol 50 mg po bid and anticoagulation with apixaban.   Symptomatic anemia- (present on admission) Anemia of chronic renal disease.  Not able to obtain blood work this am. Plan to attempt to get sample on HD. Follow on hgb,  and iron stores.  No clinical signs of bleeding.   End stage renal disease ([name])- (present on admission) Hyponatremia, hypokalemia.   Patient on renal replacement therapy [place], Thus and Sat. His left upper extremity fistula is mature.  Nephrology has been consulted for renal replacement therapy. If persistent anemia (hgb less than 8) , possible PRBC transfusion during HD.   Follow up on K and NA post HD  Chronic ulcer of right ankle ([name])- (present on admission) Lower extremity wound are stable and chronic, no clinical signs of local infection. Continue loca wound care and hold on systemic antibiotic therapy.   Type 2 diabetes mellitus with hyperlipidemia ([name])- (present on admission) His glucose has remained stable, patient not in insulin therapy. Follow up glucose this am.  At home patient on insulin 70/30 5 units bid.   Class 1 obesity- (present on admission) Calculated BMI is 31,9.         Subjective: Patient is feeling better, no dyspnea or chest pain, no palpitations, positive constipation   Physical Exam: Vitals:   08/13/21 0700 08/13/21 0800 08/13/21 0859 08/13/21 0900  BP: (!) 160/83 (!) 172/87  (!) 173/89  Pulse: 86 90 89 90  Resp: 18 19 (!) 22 17  Temp:      TempSrc:      SpO2: 100% 100% 99% 99%   Neurology awake and alert ENT with mild pallor Cardiovascular with S1 and S2 present and rhythmic, no gallops or rubs. No JVD No lower extremity edema Respiratory with no wheezing or rales. Abdomen soft  and non tender Positive pressure wounds bilateral ankles, stage 2. Clean base with no purlence.        Data Reviewed:    Family Communication: no family at the bedside   Disposition: Status is: Inpatient Remains inpatient appropriate because: anemia, possible return to SNF in am.       Planned Discharge Destination: Skilled nursing facility     Author: Tawni Millers, MD 08/13/2021 9:29 AM  For on call review [email or web address].

## 2021-08-13 NOTE — Progress Notes (Signed)
CSW spoke with [name] at [place] who states she will initiate insurance authorization from [place].  Madilyn Fireman, MSW, LCSW Transitions of Care   Clinical Social Worker II [phone]

## 2021-08-13 NOTE — Procedures (Signed)
° °  HEMODIALYSIS TREATMENT NOTE:   3.5 hour heparin-free treatment completed using right IJ TDC - exit site is unremarkable. One unit pRBC transfused.  Net UF 1.5 liters.  All blood was returned.  Rockwell Alexandria, RN

## 2021-08-13 NOTE — Progress Notes (Signed)
Bedside report given to [facility], LPN. Pr transported via bed to room 320.

## 2021-08-14 DIAGNOSIS — L97319 Non-pressure chronic ulcer of right ankle with unspecified severity: Secondary | ICD-10-CM | POA: Diagnosis not present

## 2021-08-14 DIAGNOSIS — N186 End stage renal disease: Secondary | ICD-10-CM | POA: Diagnosis not present

## 2021-08-14 DIAGNOSIS — I480 Paroxysmal atrial fibrillation: Secondary | ICD-10-CM | POA: Diagnosis not present

## 2021-08-14 DIAGNOSIS — E669 Obesity, unspecified: Secondary | ICD-10-CM | POA: Diagnosis not present

## 2021-08-14 LAB — HEMOGLOBIN AND HEMATOCRIT, BLOOD
HCT: 27.5 % — ABNORMAL LOW (ref 39.0–52.0)
Hemoglobin: 8.9 g/dL — ABNORMAL LOW (ref 13.0–17.0)

## 2021-08-14 LAB — TYPE AND SCREEN
ABO/RH(D): O POS
Antibody Screen: NEGATIVE
Unit division: 0
Unit division: 0

## 2021-08-14 LAB — BPAM RBC
Blood Product Expiration Date: 202303172359
Blood Product Expiration Date: 202303182359
ISSUE DATE / TIME: 202302171312
ISSUE DATE / TIME: 202302181719
Unit Type and Rh: 5100
Unit Type and Rh: 5100

## 2021-08-14 MED ORDER — OXYCODONE HCL 5 MG PO CAPS: 5.0000 mg | ORAL_CAPSULE | Freq: Four times a day (QID) | ORAL | 0 refills | Status: DC | PRN

## 2021-08-14 MED ORDER — RENA-VITE PO TABS
1.0000 | ORAL_TABLET | Freq: Every day | ORAL | Status: DC
  Administered 2021-08-14 – 2021-08-15 (×2): 1 via ORAL
  Filled 2021-08-14 (×2): qty 1

## 2021-08-14 MED ORDER — ENSURE ENLIVE PO LIQD
237.0000 mL | Freq: Two times a day (BID) | ORAL | Status: DC
  Administered 2021-08-15 (×2): 237 mL via ORAL

## 2021-08-14 MED ORDER — METOPROLOL TARTRATE 50 MG PO TABS: 50.0000 mg | ORAL_TABLET | Freq: Two times a day (BID) | ORAL | 0 refills | Status: DC

## 2021-08-14 NOTE — Evaluation (Signed)
Physical Therapy Evaluation Patient Details Name: Danny [name] MRN: [id number] DOB: 04/27/1956 Today's Date: 08/14/2021  History of Present Illness  66 yo male with past medical history of paroxysmal atrial fibrillation, ESRD and anemia of chronic renal disease who presented with worsening weakness and easy fatigability. On his initial physical examination he was in atrial fibrillation with rapid ventricular response, with BP 122/79, HR 154, RR 18 and oxygen saturation 92%. Mild pallor, irregularly irregular S1 and S2 with no gallops or rubs, no murmurs, lungs with no wheezing or rhonchi, abdomen soft and no lower extremity edema.  Clinical Impression  Patient agreeable to participating in PT evaluation today. Patient reports he was working on arm and leg strengthening in SNF prior to admission and had not been able to walk since early 2022. He was able to complete slide board transfers from his bed to manual wheelchair. He was not able to safely complete this transfer with max assist today. Patient relies heavily on his upper extremities and mechanics of hospital bed and bed rails for bed mobility.   Patient would continue to benefit from skilled physical therapy in current environment and next venue to continue return to prior function and increase strength, endurance, balance, coordination, and functional mobility and gait skills.       Recommendations for follow up therapy are one component of a multi-disciplinary discharge planning process, led by the attending physician.  Recommendations may be updated based on patient status, additional functional criteria and insurance authorization.  Follow Up Recommendations Skilled nursing-short term rehab (<3 hours/day)    Assistance Recommended at Discharge Frequent or constant Supervision/Assistance  Patient can return home with the following  Two people to help with walking and/or transfers;Two people to help with bathing/dressing/bathroom;Help with  stairs or ramp for entrance;Assist for transportation;Assistance with cooking/housework    Equipment Recommendations None recommended by PT  Recommendations for Other Services       Functional Status Assessment Patient has had a recent decline in their functional status and demonstrates the ability to make significant improvements in function in a reasonable and predictable amount of time.     Precautions / Restrictions Precautions Precautions: Fall Precaution Comments: patient reports no falls in the last six months Restrictions Weight Bearing Restrictions: No      Mobility  Bed Mobility Overal bed mobility: Needs Assistance Bed Mobility: Supine to Sit     Supine to sit: Supervision, HOB elevated     General bed mobility comments: heavy use of upper extremities and bedrails for sit to supien and to boost up in bed    Transfers Overall transfer level: Needs assistance Equipment used: Sliding board Transfers: Bed to chair/wheelchair/BSC         Anterior-Posterior transfers: Min guard  Lateral/Scoot Transfers: Max assist General transfer comment: unable to complete bed to chair safely with Max assist    Ambulation/Gait     General Gait Details: NA  Stairs      Wheelchair Mobility    Modified Rankin (Stroke Patients Only)       Balance Overall balance assessment: Needs assistance Sitting-balance support: Bilateral upper extremity supported, Feet supported Sitting balance-Leahy Scale: Good       Standing balance-Leahy Scale: Zero         Pertinent Vitals/Pain Pain Assessment Pain Assessment: No/denies pain    Home Living Family/patient expects to be discharged to:: Skilled nursing facility       Prior Function Prior Level of Function : Needs assist   Mobility  Comments: non-ambulatory since around April 2022 using manual wheelchair for mobility; was able to do slide board transfers bed to wheelchair ADLs Comments: assistance for all      Hand Dominance   Dominant Hand: Right    Extremity/Trunk Assessment   Upper Extremity Assessment Upper Extremity Assessment: Generalized weakness;Defer to OT evaluation    Lower Extremity Assessment Lower Extremity Assessment: Generalized weakness       Communication   Communication: No difficulties  Cognition Arousal/Alertness: Awake/alert Behavior During Therapy: WFL for tasks assessed/performed Overall Cognitive Status: Within Functional Limits for tasks assessed          General Comments      Exercises     Assessment/Plan    PT Assessment Patient needs continued PT services  PT Problem List Decreased strength;Decreased mobility;Decreased activity tolerance;Decreased balance       PT Treatment Interventions DME instruction;Therapeutic exercise;Neuromuscular re-education;Functional mobility training;Therapeutic activities;Patient/family education    PT Goals (Current goals can be found in the Care Plan section)  Acute Rehab PT Goals Patient Stated Goal: Go back to SNF to continue strengthening. PT Goal Formulation: With patient Time For Goal Achievement: 08/28/21 Potential to Achieve Goals: Fair    Frequency Min 2X/week        AM-PAC PT "6 Clicks" Mobility  Outcome Measure Help needed turning from your back to your side while in a flat bed without using bedrails?: A Little Help needed moving from lying on your back to sitting on the side of a flat bed without using bedrails?: A Lot Help needed moving to and from a bed to a chair (including a wheelchair)?: Total Help needed standing up from a chair using your arms (e.g., wheelchair or bedside chair)?: Total Help needed to walk in hospital room?: Total Help needed climbing 3-5 steps with a railing? : Total 6 Click Score: 9    End of Session Equipment Utilized During Treatment: Gait belt Activity Tolerance: Patient limited by fatigue Patient left: in bed;with call bell/phone within reach;with bed  alarm set Nurse Communication: Mobility status PT Visit Diagnosis: Muscle weakness (generalized) (M62.81);Difficulty in walking, not elsewhere classified (R26.2)    Time: 4076-8088 PT Time Calculation (min) (ACUTE ONLY): 24 min   Charges:   PT Evaluation $PT Eval Low Complexity: 1 Low PT Treatments $Therapeutic Activity: 8-22 mins        Floria Raveling. Hartnett-Rands, MS, PT Per [place] [phone]  Pamala Hurry  Hartnett-Rands 08/14/2021, 2:09 PM

## 2021-08-14 NOTE — Discharge Summary (Addendum)
Physician Discharge Summary   Patient: Danny [name] MRN: [id number] DOB: 1955/08/22  Admit date:     08/12/2021  Discharge date: 08/16/21  Discharge Physician: Jimmy Picket Arrien   PCP: Celene Squibb, MD   Recommendations at discharge:    Metoprolol dose has been adjusted for better rate control; now using toprol XL 50mg  BID. Continue anticoagulation with apixaban  Follow up Hgb level with repeat CBC as outpatient.   Discharge Diagnoses: Principal Problem:   Atrial fibrillation ([place]) Active Problems:   Symptomatic anemia   End stage renal disease (HCC)   Chronic ulcer of right ankle (HCC)   Type 2 diabetes mellitus with hyperlipidemia ([place])   Class 1 obesity   Hospital Course: Danny [name] will be admitted to the hospital with the working diagnosis of atrial fibrillation with rapid ventricular response in the setting of worsening symptomatic anemia.    66 yo male with past medical history of paroxysmal atrial fibrillation, ESRD and anemia of chronic renal disease who presented with worsening weakness and easy fatigability. On his initial physical examination he was in atrial fibrillation with rapid ventricular response, with BP 122/79, HR 154, RR 18 and oxygen saturation 92%. Mild pallor, irregularly irregular S1 and S2 with no gallops or rubs, no murmurs, lungs with no wheezing or rhonchi, abdomen soft and no lower extremity edema.    Na 134, K 3,4, CL 99, bicarbonate 25. Glucose 151, bun 32 and cr 3,52 BNP 466  Wbc 7,6, hgb 7,8, hct 24,3 and plt 272  Sars covid 19 negative    Chest radiograph with small left pleural effusion with no other significant infiltrates.    EKG 160 bpm, normal axis with normal qtc, atrial fibrillation rhythm, with ST depression in V2 to V5 with no significant T wave changes.    Patient was placed on diltiazem drip and received one unit PRBC transfusion. Rate improved and wean off diltiazem drip.  Increased dose of oral metoprolol with good  toleration.  Consulted nephrology for routine renal replacement therapy.   Assessment and Plan: * Atrial fibrillation ([place])- (present on admission) Patient admitted to the step down unit and placed on IV diltiazem for rate control. After PRBC transfusion he converted back to sinus rhythm and diltiazem was discontinued.  Dose of metoprolol has been increased to 50 mg po bid for better rate control atrial fibrillation. Continue anticoagulation with apixaban.   Symptomatic anemia- (present on admission) Anemia of chronic renal disease.  Patient had a total of 2 units PRBC transfusion during his hospitalization with good toleration.  Further work up with iron panel showed serum iron 63, TIBC 251, transferrin saturation 25 and ferritin 237. Consistent with anemia of chronic disease.  Follow as outpatient for EPO injections.   End stage renal disease ([place])- (present on admission) Hyponatremia, hypokalemia.   Patient underwent renal replacement therapy with good toleration. A the time of his discharge no clinical signs of volume overload.   For metabolic bone disease continue with sevelamer.   Chronic ulcer of right ankle ([place])- (present on admission) Lower extremity wound are stable and chronic, no clinical signs of local infection. Continue loca wound care and hold on systemic antibiotic therapy.   Type 2 diabetes mellitus with hyperlipidemia ([place])- (present on admission) Patient with episodic hyperglycemia. Plan to continue insulin therapy and close follow up as outpatient. Continue with life style modifications.   Class 1 obesity- (present on admission) Calculated BMI is 31,9.     Consultants: nephrology  Procedures  performed:   Disposition: Skilled nursing facility Diet recommendation:  Carb modified diet  DISCHARGE MEDICATION: Allergies as of 08/15/2021       Reactions   Dust Mite Extract Itching, Other (See Comments)   Unknown reaction-potential shortness of  breath   Prednisone Nausea And Vomiting   Rocephin [ceftriaxone] Nausea And Vomiting        Medication List     STOP taking these medications    polyethylene glycol-electrolytes 420 g solution Commonly known as: TriLyte   sodium phosphate Pediatric 3.5-9.5 GM/59ML enema   tamsulosin 0.4 MG Caps capsule Commonly known as: FLOMAX   Xarelto 20 MG Tabs tablet Generic drug: rivaroxaban       TAKE these medications    acetaminophen 500 MG tablet Commonly known as: TYLENOL Take 1,000 mg by mouth every 6 (six) hours as needed for moderate pain or headache.   apixaban 5 MG Tabs tablet Commonly known as: ELIQUIS Take 1 tablet (5 mg total) by mouth 2 (two) times daily.   cholecalciferol 25 MCG (1000 UNIT) tablet Commonly known as: VITAMIN D3 Take by mouth daily.   collagenase ointment Commonly known as: SANTYL Apply to right lateral malleolus once daily; apply in a 1/8 inch layer and top with saline moistened gauze 2X2.   docusate sodium 100 MG capsule Commonly known as: COLACE Take 100 mg by mouth 2 (two) times daily.   feeding supplement (NEPRO CARB STEADY) Liqd Take 237 mLs by mouth 2 (two) times daily between meals.   FISH OIL PO Take 500 mg by mouth 2 (two) times daily.   fluticasone 50 MCG/ACT nasal spray Commonly known as: FLONASE Place 2 sprays into both nostrils daily.   HUMULIN 70/30 KWIKPEN [place] Inject 5 Units into the skin 2 (two) times daily.   Lactulose 20 GM/30ML Soln Take 30 mLs (20 g total) by mouth daily. What changed: when to take this   metoprolol succinate 50 MG 24 hr tablet Commonly known as: Toprol XL Take 1 tablet (50 mg total) by mouth in the morning and at bedtime. Take with or immediately following a meal. What changed:  medication strength how much to take additional instructions   midodrine 10 MG tablet Commonly known as: PROAMATINE Take 1 tablet by mouth. Every Harrell Lark and Saturdays   multivitamin with minerals Tabs  tablet Take 1 tablet by mouth daily.   ondansetron 4 MG tablet Commonly known as: ZOFRAN Take 4 mg by mouth every 6 (six) hours. Prn.   oxycodone 5 MG capsule Commonly known as: OXY-IR Take 1 capsule (5 mg total) by mouth every 6 (six) hours as needed.   polyethylene glycol 17 g packet Commonly known as: MIRALAX / GLYCOLAX Take 17 g by mouth daily.   rosuvastatin 5 MG tablet Commonly known as: CRESTOR Take 5 mg by mouth daily.   senna 8.6 MG tablet Commonly known as: SENOKOT Take 1 tablet by mouth 2 (two) times daily.   sevelamer carbonate 800 MG tablet Commonly known as: RENVELA Take 1 tablet (800 mg total) by mouth 2 (two) times daily with a meal.   vitamin B-12 1000 MCG tablet Commonly known as: CYANOCOBALAMIN Take 1,000 mcg by mouth daily.   vitamin E 1000 UNIT capsule Take 1,000 Units by mouth daily.   Zinc 50 MG Tabs Take 250 mg by mouth daily.         Discharge Exam: Filed Weights   08/13/21 1445  Weight: 110.9 kg   BP 121/62 (BP Location: Right  Wrist)    Pulse 90    Temp (!) 97.5 F (36.4 C) (Oral)    Resp 16    Wt 110.9 kg    SpO2 93%    BMI 32.26 kg/m   Neurology awake and alert ENT with mild pallor Cardiovascular with S1 and S2 present and rhythmic with no gallops or murmurs No JVD No lower extremity edema  Respiratory with no wheezing or rales, no rhonchi Abdomen protuberant but not tender.  Bilateral ankle pressure ulcers.        Condition at discharge: stable  The results of significant diagnostics from this hospitalization (including imaging, microbiology, ancillary and laboratory) are listed below for reference.   Imaging Studies: DG Chest Port 1 View  Result Date: 08/12/2021 CLINICAL DATA:  Heart palpitations in a 66 year old male. EXAM: PORTABLE CHEST 1 VIEW COMPARISON:  Comparison is made with March 13, 2021. FINDINGS: EKG leads project over the chest. RIGHT IJ dialysis catheter tip in the upper RIGHT atrium/caval to  atrial junction based on projection. Cardiomediastinal contours remain enlarged. Improved aeration at the LEFT base till with mild LEFT basilar airspace disease. Mild blunting of LEFT costodiaphragmatic sulcus. No pneumothorax. On limited assessment there is no acute skeletal process. IMPRESSION: 1. Small LEFT effusion and basilar airspace disease appears slightly improved compared to imaging from September of 2022. 2. No acute findings. 3. Post insertion of dialysis catheter since previous chest x-ray. Electronically Signed   By: Zetta Bills M.D.   On: 08/12/2021 10:31    Microbiology: Results for orders placed or performed during the hospital encounter of 08/12/21  Blood culture (routine x 2)     Status: None (Preliminary result)   Collection Time: 08/12/21  9:38 AM   Specimen: Right Antecubital; Blood  Result Value Ref Range Status   Specimen Description   Final    RIGHT ANTECUBITAL BOTTLES DRAWN AEROBIC AND ANAEROBIC   Special Requests Blood Culture adequate volume  Final   Culture   Final    NO GROWTH 2 DAYS Performed at [facility], [address], [place], [place] [address]    Report Status PENDING  Incomplete  Resp Panel by RT-PCR (Flu A&B, Covid) Nasopharyngeal Swab     Status: None   Collection Time: 08/12/21  9:38 AM   Specimen: Nasopharyngeal Swab; Nasopharyngeal(NP) swabs in vial transport medium  Result Value Ref Range Status   SARS Coronavirus 2 by RT PCR NEGATIVE NEGATIVE Final    Comment: (NOTE) SARS-CoV-2 target nucleic acids are NOT DETECTED.  The SARS-CoV-2 RNA is generally detectable in upper respiratory specimens during the acute phase of infection. The lowest concentration of SARS-CoV-2 viral copies this assay can detect is 138 copies/mL. A negative result does not preclude SARS-Cov-2 infection and should not be used as the sole basis for treatment or other patient management decisions. A negative result may occur with  improper specimen collection/handling,  submission of specimen other than nasopharyngeal swab, presence of viral mutation(s) within the areas targeted by this assay, and inadequate number of viral copies(<138 copies/mL). A negative result must be combined with clinical observations, patient history, and epidemiological information. The expected result is Negative.  Fact Sheet for Patients:  EntrepreneurPulse.com.au  Fact Sheet for Healthcare Providers:  IncredibleEmployment.be  This test is no t yet approved or cleared by the [place] FDA and  has been authorized for detection and/or diagnosis of SARS-CoV-2 by FDA under an Emergency Use Authorization (EUA). This EUA will remain  in effect (meaning this test  can be used) for the duration of the COVID-19 declaration under Section 564(b)(1) of the Act, 21 U.S.C.section 360bbb-3(b)(1), unless the authorization is terminated  or revoked sooner.       Influenza A by PCR NEGATIVE NEGATIVE Final   Influenza B by PCR NEGATIVE NEGATIVE Final    Comment: (NOTE) The Xpert Xpress SARS-CoV-2/FLU/RSV plus assay is intended as an aid in the diagnosis of influenza from Nasopharyngeal swab specimens and should not be used as a sole basis for treatment. Nasal washings and aspirates are unacceptable for Xpert Xpress SARS-CoV-2/FLU/RSV testing.  Fact Sheet for Patients: EntrepreneurPulse.com.au  Fact Sheet for Healthcare Providers: IncredibleEmployment.be  This test is not yet approved or cleared by the [place] FDA and has been authorized for detection and/or diagnosis of SARS-CoV-2 by FDA under an Emergency Use Authorization (EUA). This EUA will remain in effect (meaning this test can be used) for the duration of the COVID-19 declaration under Section 564(b)(1) of the Act, 21 U.S.C. section 360bbb-3(b)(1), unless the authorization is terminated or revoked.  Performed at [facility], [address], [place], [place] [address]   Blood culture (routine x 2)     Status: None (Preliminary result)   Collection Time: 08/12/21 10:09 AM   Specimen: BLOOD RIGHT HAND  Result Value Ref Range Status   Specimen Description   Final    BLOOD RIGHT HAND BOTTLES DRAWN AEROBIC AND ANAEROBIC   Special Requests   Final    Blood Culture results may not be optimal due to an excessive volume of blood received in culture bottles   Culture   Final    NO GROWTH 2 DAYS Performed at [facility], [address], [place], [place] [address]    Report Status PENDING  Incomplete  MRSA Next Gen by PCR, Nasal     Status: Abnormal   Collection Time: 08/12/21  4:54 PM   Specimen: Nasal Mucosa; Nasal Swab  Result Value Ref Range Status   MRSA by PCR Next Gen DETECTED (A) NOT DETECTED Final    Comment: RESULT CALLED TO, READ BACK BY AND VERIFIED WITH: SPANGLER,K @ 3149 ON 08/12/21 BY JUW (NOTE) The GeneXpert MRSA Assay (FDA approved for NASAL specimens only), is one component of a comprehensive MRSA colonization surveillance program. It is not intended to diagnose MRSA infection nor to guide or monitor treatment for MRSA infections. Test performance is not FDA approved in patients less than 40 years old. Performed at [facility], [address] [place], [place] [address]     Labs: CBC: Recent Labs  Lab 08/10/21 1045 08/12/21 0938 08/13/21 1456 08/13/21 1502 08/14/21 0501  WBC 6.5 7.6 8.6 8.6  --   NEUTROABS 4.6 5.5  --   --   --   HGB 6.4* 7.8* 7.9* 7.9* 8.9*  HCT 19.9* 24.3* 24.6* 24.6* 27.5*  MCV 93.4 92.7 92.8 92.8  --   PLT 232 272 263 263  --    Basic Metabolic Panel: Recent Labs  Lab 08/10/21 1045 08/12/21 0938 08/13/21 1456 08/13/21 1502  NA 136 134* 132* 131*  K 4.2 3.4* 4.0 4.0  CL 100 99 98 99  CO2 22 25 24 24   GLUCOSE 140* 151* 221* 220*  BUN 59* 32* 39* 39*  CREATININE 5.13* 3.52* 4.07* 4.12*  CALCIUM 8.5* 8.1* 8.2* 8.1*  MG  --  1.8  --   --   PHOS  --   --  5.7*  --     Liver Function Tests:  Recent Labs  Lab 08/10/21 1045 08/12/21 0938 08/13/21 1456  AST 19 25  --   ALT 16 18  --   ALKPHOS 124 129*  --   BILITOT 0.7 0.8  --   PROT 6.7 7.2  --   ALBUMIN 2.3* 2.4* 2.3*   CBG: Recent Labs  Lab 08/13/21 2206  GLUCAP 213*    Discharge time spent: greater than 30 minutes.  Signed: Barton Dubois, MD  [facility] 08/14/2021

## 2021-08-14 NOTE — Plan of Care (Signed)
°  Problem: Acute Rehab PT Goals(only PT should resolve) Goal: Pt will Roll Supine to Side Outcome: Progressing Flowsheets (Taken 08/14/2021 1412) Pt will Roll Supine to Side: with modified independence Goal: Pt Will Go Supine/Side To Sit Outcome: Progressing Flowsheets (Taken 08/14/2021 1412) Pt will go Supine/Side to Sit:  with HOB elevated  with min guard assist Goal: Pt Will Go Sit To Supine/Side Outcome: Progressing Flowsheets (Taken 08/14/2021 1412) Pt will go Sit to Supine/Side:  with HOB  elevated  with min guard assist Goal: Patient Will Perform Sitting Balance Outcome: Progressing Flowsheets (Taken 08/14/2021 1412) Patient will perform sitting balance:  with supervision  3- 5 min  with unilateral UE support Goal: Patient Will Transfer Sit To/From Stand Outcome: Progressing Flowsheets (Taken 08/14/2021 1412) Patient will transfer sit to/from stand: with maximum assist Note: With slideboard   Pamala Hurry D. Hartnett-Rands, MS, PT Per [place] [phone] 08/14/2021

## 2021-08-14 NOTE — Progress Notes (Signed)
Initial Nutrition Assessment  DOCUMENTATION CODES:   Not applicable  INTERVENTION:   Renal Multivitamin w/ minerals daily Ensure Enlive po BID, each supplement provides 350 kcal and 20 grams of protein. Encourage good PO intake  NUTRITION DIAGNOSIS:   Increased nutrient needs related to chronic illness, wound healing as evidenced by estimated needs.  GOAL:   Patient will meet greater than or equal to 90% of their needs  MONITOR:   PO intake, Weight trends, Supplement acceptance, Skin  REASON FOR ASSESSMENT:   Malnutrition Screening Tool    ASSESSMENT:   66 y.o. male presented to the ED with weakness and palpitations. PMH includes T2DM, CHF, ESRD on HD (TTS), and HTN. Pt admitted with A.Fib.   RD working remotely, unable to reach pt via phone.   Per EMR, pt with 100% PO intake for the last 5 meals intakes recorded.   Per EMR, pt has gained ~10 kg in about 1 month.   Medications reviewed and include: Vitamin C, Miralax, Vitamin B12 Labs reviewed: Sodium 131, Phosphorus 5.7   HD on 2/18 ESDW: 109.5 kg Net UF: 1523 mL  NUTRITION - FOCUSED PHYSICAL EXAM:  Deferred to follow-up.   Diet Order:   Diet Order             Diet - low sodium heart healthy           Diet Heart Room service appropriate? Yes; Fluid consistency: Thin  Diet effective now                   EDUCATION NEEDS:   No education needs have been identified at this time  Skin:  Skin Assessment: Skin Integrity Issues: Skin Integrity Issues:: Other (Comment) Other: MASD- Sacrum, Venous Stasis - Bilateral ankles, Non-pressure wound - groin  Last BM:  2/18  Height:   Ht Readings from Last 1 Encounters:  08/10/21 6\' 1"  (1.854 m)    Weight:   Wt Readings from Last 1 Encounters:  08/13/21 110.9 kg    Ideal Body Weight:     BMI:  Body mass index is 32.26 kg/m.  Estimated Nutritional Needs:   Kcal:  6063-0160  Protein:  115-130 grams  Fluid:  UOP + 1 L    Nikeria Kalman  Louie Casa, RD, LDN Clinical Dietitian See [facility] for contact information.

## 2021-08-14 NOTE — Progress Notes (Signed)
CSW attempted to reach [place] at [facility] without success - CSW awaiting return call regarding insurance authorization.  Madilyn Fireman, MSW, LCSW Transitions of Care   Clinical Social Worker II [phone]

## 2021-08-15 DIAGNOSIS — I482 Chronic atrial fibrillation, unspecified: Secondary | ICD-10-CM

## 2021-08-15 DIAGNOSIS — D509 Iron deficiency anemia, unspecified: Secondary | ICD-10-CM

## 2021-08-15 LAB — HEPATITIS B SURFACE ANTIBODY, QUANTITATIVE: Hep B S AB Quant (Post): 4.5 m[IU]/mL — ABNORMAL LOW (ref >9.9)

## 2021-08-15 MED ORDER — PROSOURCE PLUS PO LIQD
30.0000 mL | Freq: Two times a day (BID) | ORAL | Status: DC
  Administered 2021-08-15 – 2021-08-16 (×2): 30 mL via ORAL
  Filled 2021-08-15 (×2): qty 30

## 2021-08-15 MED ORDER — ZINC SULFATE 220 (50 ZN) MG PO CAPS
220.0000 mg | ORAL_CAPSULE | Freq: Every day | ORAL | Status: DC
  Administered 2021-08-15 – 2021-08-16 (×2): 220 mg via ORAL
  Filled 2021-08-15 (×2): qty 1

## 2021-08-15 MED ORDER — METOPROLOL TARTRATE 5 MG/5ML IV SOLN
5.0000 mg | Freq: Once | INTRAVENOUS | Status: AC
  Administered 2021-08-15: 5 mg via INTRAVENOUS
  Filled 2021-08-15: qty 5

## 2021-08-15 MED ORDER — NEPRO/CARBSTEADY PO LIQD: 237.0000 mL | Freq: Two times a day (BID) | ORAL | Status: DC

## 2021-08-15 MED ORDER — METOPROLOL SUCCINATE ER 50 MG PO TB24: 50.0000 mg | ORAL_TABLET | Freq: Two times a day (BID) | ORAL | 3 refills | Status: DC

## 2021-08-15 MED ORDER — COLLAGENASE 250 UNIT/GM EX OINT
TOPICAL_OINTMENT | Freq: Every day | CUTANEOUS | Status: DC
  Filled 2021-08-15: qty 30

## 2021-08-15 NOTE — TOC Progression Note (Signed)
Transition of Care [facility]) - Progression Note    Patient Details  Name: Danny [name] MRN: [id number] Date of Birth: March 29, 1956  [facility] CM/SW Contact  Shade Flood, LCSW Phone Number: 08/15/2021, 3:57 PM  Clinical Narrative:     TOC following. Awaiting insurance auth for short term rehab. Pt admitted from long term care at [facility]. Spoke with [name] at [facility] to inquire if pt could return there under his Medicaid while they wait for Medicare auth and [name] stated that no he cannot.   Updated MD. Will follow up in AM.     Expected Discharge Plan and Services                                                 Social Determinants of Health (SDOH) Interventions    Readmission Risk Interventions Readmission Risk Prevention Plan 01/27/2021 01/13/2021 11/05/2020  Transportation Screening Complete Complete -  PCP or Specialist Appt within 3-5 Days - - Complete  Not Complete comments - - -  HRI or [place] Work Consult for [place] Planning/Counseling - - -  Palliative Care Screening - - -  Medication Review Press photographer) Complete Complete -  PCP or Specialist appointment within 3-5 days of discharge Complete - -  [place] or Home Care Consult Complete Complete -  SW Recovery Care/Counseling Consult Complete Complete -  Palliative Care Screening Not Complete Not Complete -  Skilled Nursing Facility Complete Complete -  Some recent data might be hidden

## 2021-08-15 NOTE — Progress Notes (Signed)
°   08/15/21 1250  Assess: MEWS Score  Temp 98 F (36.7 C)  BP (!) 130/98  Pulse Rate (!) 156  Resp 18  Level of Consciousness Alert  SpO2 95 %  O2 Device Room Air  Assess: MEWS Score  MEWS Temp 0  MEWS Systolic 0  MEWS Pulse 3  MEWS RR 0  MEWS LOC 0  MEWS Score 3  MEWS Score Color Yellow  Assess: if the MEWS score is Yellow or Red  Were vital signs taken at a resting state? Yes  Focused Assessment Change from prior assessment (see assessment flowsheet)  Early Detection of Sepsis Score *See Row Information* Low  MEWS guidelines implemented *See Row Information* Yes  Treat  MEWS Interventions Other (Comment) (MD paged)  Pain Scale 0-10  Pain Score 0  Take Vital Signs  Increase Vital Sign Frequency  Yellow: Q 2hr X 2 then Q 4hr X 2, if remains yellow, continue Q 4hrs  Escalate  MEWS: Escalate Yellow: discuss with charge nurse/RN and consider discussing with provider and RRT  Notify: Charge Nurse/RN  Name of Charge Nurse/RN Notified Apolonio Schneiders RN  Date Charge Nurse/RN Notified 08/15/21  Time Charge Nurse/RN Notified 1255  Notify: Provider  Provider Name/Title Dr Dyann Kief  Date Provider Notified 08/15/21  Time Provider Notified 1255  Notification Type Page  Notification Reason Other (Comment) (HR sustaining 150s yellow mews)  Provider response No new orders;Other (Comment) (at this time)  Date of Provider Response 08/15/21  Time of Provider Response 1256

## 2021-08-15 NOTE — Consult Note (Addendum)
[facility] Nurse Consult Note: Reason for Consult: Consult requested for bilat ankle wounds.  Performed remotely after review of photos in the EMR and progress notes.  Wounds are red/yellow and moist. Wound type: Bilat outer ankles with chronic full thickness wounds.  Pt was using Santyl prior to admission, according to medication list. Continue present plan of care to assist with removal of nonviable tissue.   Dressing procedure/placement/frequency: Topical treatment orders provided for bedside nurses to perform as follows: Apply Santyl to left and right ankle wounds Q day, then cover with moist gauze and foam dresssing. (Change foam dressing Q 3 days or PRN soiling.) Please re-consult if further assistance is needed.  Thank-you,  Julien Girt MSN, [place], [place], [place], [place]

## 2021-08-15 NOTE — Progress Notes (Signed)
Subjective:  Had HD Sat-  transfused and removed 1500-  there is a discharge summary in the chart but pt is still here -  he thinks that he will go today   Objective Vital signs in last 24 hours: Vitals:   08/14/21 0503 08/14/21 1500 08/14/21 2206 08/15/21 0448  BP: 121/62 124/68 (!) 128/56 (!) 144/69  Pulse: 90 87 97 75  Resp: 16 17 20 16   Temp: (!) 97.5 F (36.4 C) 98 F (36.7 C) 98.8 F (37.1 C) 99 F (37.2 C)  TempSrc: Oral  Oral Oral  SpO2: 93% 95% 92% 93%  Weight:       Weight change:   Intake/Output Summary (Last 24 hours) at 08/15/2021 0813 Last data filed at 08/15/2021 0622 Gross per 24 hour  Intake 720 ml  Output 1000 ml  Net -280 ml   outpatient orders: [facility], TTS, 4 hours.  EDW 109.5 kg, Revaclear 300, 400/500, 2K, 2.5 calcium.  UF profile 2.  Meds: Mircera 200 mcg every 2 weeks (last dose on the week of 2/13)   Assessment/ Plan: Pt is a 66 y.o. yo male with ESRD who was admitted on 08/12/2021 with rapid Afib and anemia  Assessment/Plan: 1. Afib-  converted after transfusion and diltiazem-  metoprolol has been increased-  remains in NSR 2. ESRD -  had HD on schedule Sat-  will be due tomorrow-  hopefully at home unit 3. Anemia-  low enough to need 2 units PRBC-  improved -  will need cont ESA at OP kidney center 4. Secondary hyperparathyroidism-  has not been on renvela here-  since will likely go home will not resume here 5. HTN/volume-  fair control-  no change   Louis Meckel    Labs: Basic Metabolic Panel: Recent Labs  Lab 08/12/21 0938 08/13/21 1456 08/13/21 1502  NA 134* 132* 131*  K 3.4* 4.0 4.0  CL 99 98 99  CO2 25 24 24   GLUCOSE 151* 221* 220*  BUN 32* 39* 39*  CREATININE 3.52* 4.07* 4.12*  CALCIUM 8.1* 8.2* 8.1*  PHOS  --  5.7*  --    Liver Function Tests: Recent Labs  Lab 08/10/21 1045 08/12/21 0938 08/13/21 1456  AST 19 25  --   ALT 16 18  --   ALKPHOS 124 129*  --   BILITOT 0.7 0.8  --   PROT 6.7 7.2  --    ALBUMIN 2.3* 2.4* 2.3*   No results for input(s): LIPASE, AMYLASE in the last 168 hours. No results for input(s): AMMONIA in the last 168 hours. CBC: Recent Labs  Lab 08/10/21 1045 08/12/21 0938 08/13/21 1456 08/13/21 1502 08/14/21 0501  WBC 6.5 7.6 8.6 8.6  --   NEUTROABS 4.6 5.5  --   --   --   HGB 6.4* 7.8* 7.9* 7.9* 8.9*  HCT 19.9* 24.3* 24.6* 24.6* 27.5*  MCV 93.4 92.7 92.8 92.8  --   PLT 232 272 263 263  --    Cardiac Enzymes: No results for input(s): CKTOTAL, CKMB, CKMBINDEX, TROPONINI in the last 168 hours. CBG: Recent Labs  Lab 08/13/21 2206  GLUCAP 213*    Iron Studies:  Recent Labs    08/13/21 1503  IRON 63  TIBC 251  TRANSFERRIN 179*  FERRITIN 237   Studies/Results: No results found. Medications: Infusions:  sodium chloride     sodium chloride      Scheduled Medications:  apixaban  5 mg Oral BID   ascorbic  acid  1,000 mg Oral Daily   Chlorhexidine Gluconate Cloth  6 each Topical Q0600   Chlorhexidine Gluconate Cloth  6 each Topical Q0600   feeding supplement  237 mL Oral BID BM   metoprolol tartrate  50 mg Oral BID   multivitamin  1 tablet Oral QHS   mupirocin ointment  1 application Nasal BID   polyethylene glycol  17 g Oral Daily   rosuvastatin  5 mg Oral Daily   tamsulosin  0.4 mg Oral QPC supper   vitamin B-12  1,000 mcg Oral Daily    have reviewed scheduled and prn medications.  Physical Exam: General:  alert, NAD Heart: RRR Lungs: mostly clear Abdomen: obese, soft, non tender Extremities: min edema Dialysis Access: has TDC and also left upper AVF     08/15/2021,8:13 AM  LOS: 3 days

## 2021-08-15 NOTE — Progress Notes (Signed)
Patient seen and examined; afebrile, no chest pain, no nausea, no vomiting.  Reports no palpitation and been hemodynamically stable.  Currently waiting for insurance authorization in order to return back to skilled nursing facility for further care and rehab.  Please refer to discharge summary written on 08/14/2021 for further info/details on his discharge instructions.  No changes to modifications needed at this point after review.   Barton Dubois MD [phone]

## 2021-08-15 NOTE — Care Management Important Message (Signed)
Important Message  Patient Details  Name: Danny [name] MRN: [id number] Date of Birth: [birth year]   Medicare Important Message Given:  Yes     [name] 08/15/2021, 11:56 AM

## 2021-08-15 NOTE — Progress Notes (Addendum)
Nutrition Follow-up  DOCUMENTATION CODES:   Not applicable  INTERVENTION:   -D/c Ensure Enlive po BID, each supplement provides 350 kcal and 20 grams of protein.  -Nepro Shake po BID, each supplement provides 425 kcal and 19 grams protein  -30 ml Prosource Plus BID, each supplement provides 100 kcals and 15 grams protein -Continue renal MVI daily -Continue 1000 mg vitamin C daily -220 mg zinc sulfate daily x 14 days -Liberalize diet to 2 gram sodium for increased variety of meal selections  NUTRITION DIAGNOSIS:   Increased nutrient needs related to chronic illness, wound healing as evidenced by estimated needs.  Ongoing  GOAL:   Patient will meet greater than or equal to 90% of their needs  Progressing   MONITOR:   PO intake, Weight trends, Supplement acceptance, Skin  REASON FOR ASSESSMENT:   Malnutrition Screening Tool    ASSESSMENT:   66 y.o. male presented to the ED with weakness and palpitations. PMH includes T2DM, CHF, ESRD on HD (TTS), and HTN. Pt admitted with A.Fib.  Reviewed I/O's: -280 ml x 24 hours and +635 ml since admission  UOP: 1 L x 24 hours  Spoke with pt at bedside, who was pleasant and in good spirits today. Pt reports decreased oral intake during hospitalization due to having frequent bowel movements and fear that someone is unable to help him in time to get to the bathroom. Per pt, he was intermittent issues with constipation PTA and uses dulcolax when he is feeling constipated. He also shares that a nurse tech recently manually disimpacted him PTA.   PTA he has a good appetite, consuming 3 meals per day.   Pt shares that his leg and foot wound are chronic and he is followed by wound care at his facility. He also takes zinc, vitamin C, and MVI.   Discussed importance of good meal and supplement intake to promote healing. Pt asks if there is a preferred vitamin, especially one that has all the nutrients needs for wound healing. RD discussed  importance of additional supplementation for wound healing.   Reviewed wt hx; pt has experienced a 2.2% wt loss over the past 6 months, which is not significant for time frame.   Medications reviewed and include miralac and vitamin B-12.   Lab Results  Component Value Date   HGBA1C 6.8 (H) 01/12/2021   PTA DM medications are .   Labs reviewed: Na: 131, CBGS: 213 (inpatient orders for glycemic control are none).     Diet Order:   Diet Order             Diet - low sodium heart healthy           Diet Heart Room service appropriate? Yes; Fluid consistency: Thin  Diet effective now                   EDUCATION NEEDS:   No education needs have been identified at this time  Skin:  Skin Assessment: Skin Integrity Issues: Skin Integrity Issues:: Other (Comment) Other: MASD- Sacrum, Venous Stasis - Bilateral ankles, Non-pressure wound - groin  Last BM:  08/13/21  Height:   Ht Readings from Last 1 Encounters:  08/15/21 6\' 1"  (1.854 m)    Weight:   Wt Readings from Last 1 Encounters:  08/15/21 110.9 kg    Ideal Body Weight:  83.6 kg  BMI:  Body mass index is 32.26 kg/m.  Estimated Nutritional Needs:   Kcal:  4818-5631  Protein:  125-150  grams  Fluid:  UOP + 1 L    Loistine Chance, RD, LDN, [place] Registered Dietitian II Certified Diabetes Care and Education Specialist Please refer to [facility] for RD and/or RD on-call/weekend/after hours pager

## 2021-08-16 DIAGNOSIS — I10 Essential (primary) hypertension: Secondary | ICD-10-CM | POA: Diagnosis not present

## 2021-08-16 DIAGNOSIS — Z23 Encounter for immunization: Secondary | ICD-10-CM | POA: Diagnosis not present

## 2021-08-16 DIAGNOSIS — D509 Iron deficiency anemia, unspecified: Secondary | ICD-10-CM | POA: Diagnosis not present

## 2021-08-16 DIAGNOSIS — Z79899 Other long term (current) drug therapy: Secondary | ICD-10-CM | POA: Diagnosis not present

## 2021-08-16 DIAGNOSIS — E1140 Type 2 diabetes mellitus with diabetic neuropathy, unspecified: Secondary | ICD-10-CM | POA: Diagnosis not present

## 2021-08-16 DIAGNOSIS — R279 Unspecified lack of coordination: Secondary | ICD-10-CM | POA: Diagnosis not present

## 2021-08-16 DIAGNOSIS — I480 Paroxysmal atrial fibrillation: Secondary | ICD-10-CM | POA: Diagnosis not present

## 2021-08-16 DIAGNOSIS — E669 Obesity, unspecified: Secondary | ICD-10-CM | POA: Diagnosis not present

## 2021-08-16 DIAGNOSIS — L89513 Pressure ulcer of right ankle, stage 3: Secondary | ICD-10-CM | POA: Diagnosis not present

## 2021-08-16 DIAGNOSIS — Z1159 Encounter for screening for other viral diseases: Secondary | ICD-10-CM | POA: Diagnosis not present

## 2021-08-16 DIAGNOSIS — I5030 Unspecified diastolic (congestive) heart failure: Secondary | ICD-10-CM | POA: Diagnosis not present

## 2021-08-16 DIAGNOSIS — I5033 Acute on chronic diastolic (congestive) heart failure: Secondary | ICD-10-CM | POA: Diagnosis not present

## 2021-08-16 DIAGNOSIS — L89520 Pressure ulcer of left ankle, unstageable: Secondary | ICD-10-CM | POA: Diagnosis not present

## 2021-08-16 DIAGNOSIS — N186 End stage renal disease: Secondary | ICD-10-CM | POA: Diagnosis not present

## 2021-08-16 DIAGNOSIS — E1169 Type 2 diabetes mellitus with other specified complication: Secondary | ICD-10-CM | POA: Diagnosis not present

## 2021-08-16 DIAGNOSIS — R2681 Unsteadiness on feet: Secondary | ICD-10-CM | POA: Diagnosis not present

## 2021-08-16 DIAGNOSIS — I872 Venous insufficiency (chronic) (peripheral): Secondary | ICD-10-CM | POA: Diagnosis not present

## 2021-08-16 DIAGNOSIS — E785 Hyperlipidemia, unspecified: Secondary | ICD-10-CM | POA: Diagnosis not present

## 2021-08-16 DIAGNOSIS — E11622 Type 2 diabetes mellitus with other skin ulcer: Secondary | ICD-10-CM | POA: Diagnosis not present

## 2021-08-16 DIAGNOSIS — E440 Moderate protein-calorie malnutrition: Secondary | ICD-10-CM | POA: Diagnosis not present

## 2021-08-16 DIAGNOSIS — N185 Chronic kidney disease, stage 5: Secondary | ICD-10-CM | POA: Diagnosis not present

## 2021-08-16 DIAGNOSIS — I4891 Unspecified atrial fibrillation: Secondary | ICD-10-CM | POA: Diagnosis not present

## 2021-08-16 DIAGNOSIS — D631 Anemia in chronic kidney disease: Secondary | ICD-10-CM | POA: Diagnosis not present

## 2021-08-16 DIAGNOSIS — I132 Hypertensive heart and chronic kidney disease with heart failure and with stage 5 chronic kidney disease, or end stage renal disease: Secondary | ICD-10-CM | POA: Diagnosis not present

## 2021-08-16 DIAGNOSIS — M6281 Muscle weakness (generalized): Secondary | ICD-10-CM | POA: Diagnosis not present

## 2021-08-16 DIAGNOSIS — Z992 Dependence on renal dialysis: Secondary | ICD-10-CM | POA: Diagnosis not present

## 2021-08-16 DIAGNOSIS — D649 Anemia, unspecified: Secondary | ICD-10-CM | POA: Diagnosis not present

## 2021-08-16 DIAGNOSIS — N400 Enlarged prostate without lower urinary tract symptoms: Secondary | ICD-10-CM | POA: Diagnosis not present

## 2021-08-16 DIAGNOSIS — E213 Hyperparathyroidism, unspecified: Secondary | ICD-10-CM | POA: Diagnosis not present

## 2021-08-16 DIAGNOSIS — E1165 Type 2 diabetes mellitus with hyperglycemia: Secondary | ICD-10-CM | POA: Diagnosis not present

## 2021-08-16 LAB — CBC
HCT: 28.6 % — ABNORMAL LOW (ref 39.0–52.0)
Hemoglobin: 9.1 g/dL — ABNORMAL LOW (ref 13.0–17.0)
MCH: 30.2 pg (ref 26.0–34.0)
MCHC: 31.8 g/dL (ref 30.0–36.0)
MCV: 95 fL (ref 80.0–100.0)
Platelets: 267 10*3/uL (ref 150–400)
RBC: 3.01 MIL/uL — ABNORMAL LOW (ref 4.22–5.81)
RDW: 17.8 % — ABNORMAL HIGH (ref 11.5–15.5)
WBC: 7.2 10*3/uL (ref 4.0–10.5)
nRBC: 0 % (ref 0.0–0.2)

## 2021-08-16 LAB — RENAL FUNCTION PANEL
Albumin: 2.2 g/dL — ABNORMAL LOW (ref 3.5–5.0)
Anion gap: 7 (ref 5–15)
BUN: 48 mg/dL — ABNORMAL HIGH (ref 8–23)
CO2: 28 mmol/L (ref 22–32)
Calcium: 8.3 mg/dL — ABNORMAL LOW (ref 8.9–10.3)
Chloride: 99 mmol/L (ref 98–111)
Creatinine, Ser: 4.21 mg/dL — ABNORMAL HIGH (ref 0.61–1.24)
GFR, Estimated: 15 mL/min — ABNORMAL LOW (ref >60)
Glucose, Bld: 208 mg/dL — ABNORMAL HIGH (ref 70–99)
Phosphorus: 5.2 mg/dL — ABNORMAL HIGH (ref 2.5–4.6)
Potassium: 4 mmol/L (ref 3.5–5.1)
Sodium: 134 mmol/L — ABNORMAL LOW (ref 135–145)

## 2021-08-16 LAB — METHYLMALONIC ACID, SERUM: Methylmalonic Acid, Quantitative: 1004 nmol/L — ABNORMAL HIGH (ref 0–378)

## 2021-08-16 MED ORDER — OXYCODONE HCL 5 MG PO CAPS: 5.0000 mg | ORAL_CAPSULE | Freq: Four times a day (QID) | ORAL | 0 refills | Status: DC | PRN

## 2021-08-16 MED ORDER — DARBEPOETIN ALFA 200 MCG/0.4ML IJ SOSY
200.0000 ug | PREFILLED_SYRINGE | Freq: Once | INTRAMUSCULAR | Status: AC
  Administered 2021-08-16: 200 ug via INTRAVENOUS
  Filled 2021-08-16: qty 0.4

## 2021-08-16 MED ORDER — HEPARIN SODIUM (PORCINE) 1000 UNIT/ML DIALYSIS: 20.0000 [IU]/kg | INTRAMUSCULAR | Status: DC | PRN

## 2021-08-16 MED ORDER — HEPARIN SODIUM (PORCINE) 1000 UNIT/ML IJ SOLN
INTRAMUSCULAR | Status: AC
  Administered 2021-08-16: 2200 [IU] via INTRAVENOUS_CENTRAL
  Filled 2021-08-16: qty 6

## 2021-08-16 MED ORDER — CHLORHEXIDINE GLUCONATE CLOTH 2 % EX PADS
6.0000 | MEDICATED_PAD | Freq: Every day | CUTANEOUS | Status: DC
  Administered 2021-08-16: 6 via TOPICAL

## 2021-08-16 NOTE — Progress Notes (Signed)
Pt is alert and orientedx4. Morning medication was administered without complications. Wounds on bilateral legs was cleansed and redressed. Pt denies any pain at this time.

## 2021-08-16 NOTE — Procedures (Signed)
° °  HEMODIALYSIS TREATMENT NOTE:   3.75 hour low-heparin session completed using RIJ TDC. Goal NOT met.  UF was limited by tachycardia which resolved with interruption of ultrafiltration and administration of his previously held Metoprolol $RemoveBeforeD'50mg'[id number]$  p.o.  Net UF 1 liter.  All blood was returned.   Rockwell Alexandria, RN

## 2021-08-16 NOTE — Progress Notes (Signed)
Report was given to [place].

## 2021-08-16 NOTE — Progress Notes (Signed)
Oncology Discharge Planning Note  [facility] Address: [address], [place] [address] Hours of Operation:  8am - 5pm, Monday - Friday  Clinic Contact Information:  [phone]  Oncology Care Team: Medical Hematologist: Followed by Tarri Abernethy, [facility]  Patient Details: Name:  Danny, [name] MRN:   [id number] DOB:   1955/09/07 Reason for Current Admission: Atrial fibrillation [facility])  Discharge Planning Narrative: Discharge follow-up appointments for oncology are current and available on the AVS and MyChart.  [facility] reminded of appt with [place] 08/17/21 @ 11:00.

## 2021-08-16 NOTE — Progress Notes (Signed)
Patient seen and examined. Hemodynamically stable and in not acute distress. Chart reviewed and discharge summary updated. Patient is waiting on insurance authorization in order to be discharge back to SNF for further care/rehabilitation. Patient will have his HD as per outpatient schedule today, after that can be discharge to [facility] if insurance authorization acquired.   Please refer to discharge summary from 08/14/21 for further info/details on discharge instructions.  Barton Dubois MD [phone]

## 2021-08-16 NOTE — Progress Notes (Signed)
Subjective:  insurance is holding up discharge-  no new issues-  due for HD today    Objective Vital signs in last 24 hours: Vitals:   08/15/21 1452 08/15/21 1819 08/15/21 2309 08/16/21 0533  BP: 130/68 (!) 147/76 (!) 148/80 139/76  Pulse: 86 (!) 103 80 86  Resp: 19 18 20 20   Temp:  97.6 F (36.4 C) 98.1 F (36.7 C) 98.1 F (36.7 C)  TempSrc:  Oral Oral Oral  SpO2: 91% 95% 98% 94%  Weight:      Height:       Weight change:   Intake/Output Summary (Last 24 hours) at 08/16/2021 0756 Last data filed at 08/16/2021 0430 Gross per 24 hour  Intake 1100 ml  Output 900 ml  Net 200 ml   outpatient orders: [facility], TTS, 4 hours.  EDW 109.5 kg, Revaclear 300, 400/500, 2K, 2.5 calcium.  UF profile 2.  Meds: Mircera 200 mcg every 2 weeks (last dose on the week of 2/13)   Assessment/ Plan: Pt is a 66 y.o. yo male with ESRD who was admitted on 08/12/2021 with rapid Afib and anemia  Assessment/Plan: 1. Afib-  converted after transfusion and diltiazem-  metoprolol has been increased-  remains in NSR 2. ESRD -  had HD on schedule Sat-  due today-  since discharge held up will do HD here today  3. Anemia-  low enough to need 2 units PRBC-  improved -  will need cont ESA -  will dose today  4. Secondary hyperparathyroidism-  has not been on renvela here-  since will likely go home will not resume here 5. HTN/volume-  fair control-  no change in meds   Louis Meckel    Labs: Basic Metabolic Panel: Recent Labs  Lab 08/12/21 0938 08/13/21 1456 08/13/21 1502  NA 134* 132* 131*  K 3.4* 4.0 4.0  CL 99 98 99  CO2 25 24 24   GLUCOSE 151* 221* 220*  BUN 32* 39* 39*  CREATININE 3.52* 4.07* 4.12*  CALCIUM 8.1* 8.2* 8.1*  PHOS  --  5.7*  --    Liver Function Tests: Recent Labs  Lab 08/10/21 1045 08/12/21 0938 08/13/21 1456  AST 19 25  --   ALT 16 18  --   ALKPHOS 124 129*  --   BILITOT 0.7 0.8  --   PROT 6.7 7.2  --   ALBUMIN 2.3* 2.4* 2.3*   No results for input(s):  LIPASE, AMYLASE in the last 168 hours. No results for input(s): AMMONIA in the last 168 hours. CBC: Recent Labs  Lab 08/10/21 1045 08/12/21 0938 08/13/21 1456 08/13/21 1502 08/14/21 0501  WBC 6.5 7.6 8.6 8.6  --   NEUTROABS 4.6 5.5  --   --   --   HGB 6.4* 7.8* 7.9* 7.9* 8.9*  HCT 19.9* 24.3* 24.6* 24.6* 27.5*  MCV 93.4 92.7 92.8 92.8  --   PLT 232 272 263 263  --    Cardiac Enzymes: No results for input(s): CKTOTAL, CKMB, CKMBINDEX, TROPONINI in the last 168 hours. CBG: Recent Labs  Lab 08/13/21 2206  GLUCAP 213*    Iron Studies:  Recent Labs    08/13/21 1503  IRON 63  TIBC 251  TRANSFERRIN 179*  FERRITIN 237   Studies/Results: No results found. Medications: Infusions:  sodium chloride     sodium chloride      Scheduled Medications:  (feeding supplement) PROSource Plus  30 mL Oral BID BM   apixaban  5 mg Oral BID   ascorbic acid  1,000 mg Oral Daily   Chlorhexidine Gluconate Cloth  6 each Topical Q0600   Chlorhexidine Gluconate Cloth  6 each Topical Q0600   collagenase   Topical Daily   feeding supplement (NEPRO CARB STEADY)  237 mL Oral BID BM   metoprolol tartrate  50 mg Oral BID   multivitamin  1 tablet Oral QHS   mupirocin ointment  1 application Nasal BID   polyethylene glycol  17 g Oral Daily   rosuvastatin  5 mg Oral Daily   tamsulosin  0.4 mg Oral QPC supper   vitamin B-12  1,000 mcg Oral Daily   zinc sulfate  220 mg Oral Daily    have reviewed scheduled and prn medications.  Physical Exam: General:  alert, NAD Heart: RRR Lungs: mostly clear Abdomen: obese, soft, non tender Extremities: 1+ edema Dialysis Access: has TDC and also left upper AVF     08/16/2021,7:56 AM  LOS: 4 days

## 2021-08-16 NOTE — TOC Transition Note (Signed)
Transition of Care [facility]) - CM/SW Discharge Note   Patient Details  Name: Danny [name] MRN: [id number] Date of Birth: January 21, 1956  Transition of Care [facility] CM/SW Contact:  Shade Flood, LCSW Phone Number: 08/16/2021, 12:40 PM   Clinical Narrative:     Pt has insurance auth for SNF and MD states pt medically stable for dc after HD today. DC clinical sent electronically. RN to call report. EMS will transport.  There are no other TOC needs for dc.  Final next level of care: Skilled Nursing Facility Barriers to Discharge: Barriers Resolved   Patient Goals and CMS Choice Patient states their goals for this hospitalization and ongoing recovery are:: get better      Discharge Placement                Patient to be transferred to facility by: EMS Name of family member notified: pt only Patient and family notified of of transfer: 08/16/21  Discharge Plan and Services                                     Social Determinants of Health (SDOH) Interventions     Readmission Risk Interventions Readmission Risk Prevention Plan 01/27/2021 01/13/2021 11/05/2020  Transportation Screening Complete Complete -  PCP or Specialist Appt within 3-5 Days - - Complete  Not Complete comments - - -  HRI or [place] Work Consult for [place] Planning/Counseling - - -  Palliative Care Screening - - -  Medication Review Press photographer) Complete Complete -  PCP or Specialist appointment within 3-5 days of discharge Complete - -  [place] or Home Care Consult Complete Complete -  SW Recovery Care/Counseling Consult Complete Complete -  Palliative Care Screening Not Complete Not Complete -  Skilled Nursing Facility Complete Complete -  Some recent data might be hidden

## 2021-08-16 NOTE — Progress Notes (Signed)
OT Cancellation Note  Patient Details Name: Danny [name] MRN: [id number] DOB: Jun 25, 1956   Cancelled Treatment:    Reason Eval/Treat Not Completed: Patient declined, no reason specified. Pt had just been set up to eat breakfast when this OT entered room. Will attempt to evaluate pt later as time permits.   Veyda Kaufman OT, MOT   Larey Seat 08/16/2021, 11:40 AM

## 2021-08-16 NOTE — Progress Notes (Unsigned)
NO SHOW

## 2021-08-17 ENCOUNTER — Inpatient Hospital Stay (HOSPITAL_COMMUNITY): Payer: Medicare Other

## 2021-08-17 ENCOUNTER — Inpatient Hospital Stay (HOSPITAL_COMMUNITY): Payer: Medicare Other | Admitting: Physician Assistant

## 2021-08-17 DIAGNOSIS — L89520 Pressure ulcer of left ankle, unstageable: Secondary | ICD-10-CM | POA: Diagnosis not present

## 2021-08-17 DIAGNOSIS — E11622 Type 2 diabetes mellitus with other skin ulcer: Secondary | ICD-10-CM | POA: Diagnosis not present

## 2021-08-17 DIAGNOSIS — L89513 Pressure ulcer of right ankle, stage 3: Secondary | ICD-10-CM | POA: Diagnosis not present

## 2021-08-17 DIAGNOSIS — E669 Obesity, unspecified: Secondary | ICD-10-CM | POA: Diagnosis not present

## 2021-08-17 LAB — CULTURE, BLOOD (ROUTINE X 2)
Culture: NO GROWTH
Culture: NO GROWTH
Special Requests: ADEQUATE

## 2021-08-18 DIAGNOSIS — I10 Essential (primary) hypertension: Secondary | ICD-10-CM | POA: Diagnosis not present

## 2021-08-18 DIAGNOSIS — Z992 Dependence on renal dialysis: Secondary | ICD-10-CM | POA: Diagnosis not present

## 2021-08-18 DIAGNOSIS — D631 Anemia in chronic kidney disease: Secondary | ICD-10-CM | POA: Diagnosis not present

## 2021-08-18 DIAGNOSIS — Z79899 Other long term (current) drug therapy: Secondary | ICD-10-CM | POA: Diagnosis not present

## 2021-08-18 DIAGNOSIS — N186 End stage renal disease: Secondary | ICD-10-CM | POA: Diagnosis not present

## 2021-08-18 DIAGNOSIS — D649 Anemia, unspecified: Secondary | ICD-10-CM | POA: Diagnosis not present

## 2021-08-18 DIAGNOSIS — E1169 Type 2 diabetes mellitus with other specified complication: Secondary | ICD-10-CM | POA: Diagnosis not present

## 2021-08-22 DIAGNOSIS — E1169 Type 2 diabetes mellitus with other specified complication: Secondary | ICD-10-CM | POA: Diagnosis not present

## 2021-08-22 DIAGNOSIS — D649 Anemia, unspecified: Secondary | ICD-10-CM | POA: Diagnosis not present

## 2021-08-22 DIAGNOSIS — I10 Essential (primary) hypertension: Secondary | ICD-10-CM | POA: Diagnosis not present

## 2021-08-22 DIAGNOSIS — I4891 Unspecified atrial fibrillation: Secondary | ICD-10-CM | POA: Diagnosis not present

## 2021-08-23 DIAGNOSIS — N186 End stage renal disease: Secondary | ICD-10-CM | POA: Diagnosis not present

## 2021-08-23 DIAGNOSIS — Z992 Dependence on renal dialysis: Secondary | ICD-10-CM | POA: Diagnosis not present

## 2021-08-23 DIAGNOSIS — D631 Anemia in chronic kidney disease: Secondary | ICD-10-CM | POA: Diagnosis not present

## 2021-08-25 DIAGNOSIS — D509 Iron deficiency anemia, unspecified: Secondary | ICD-10-CM | POA: Diagnosis not present

## 2021-08-25 DIAGNOSIS — N186 End stage renal disease: Secondary | ICD-10-CM | POA: Diagnosis not present

## 2021-08-25 DIAGNOSIS — D631 Anemia in chronic kidney disease: Secondary | ICD-10-CM | POA: Diagnosis not present

## 2021-08-25 DIAGNOSIS — Z23 Encounter for immunization: Secondary | ICD-10-CM | POA: Diagnosis not present

## 2021-08-25 DIAGNOSIS — Z992 Dependence on renal dialysis: Secondary | ICD-10-CM | POA: Diagnosis not present

## 2021-08-27 DIAGNOSIS — N186 End stage renal disease: Secondary | ICD-10-CM | POA: Diagnosis not present

## 2021-08-27 DIAGNOSIS — D631 Anemia in chronic kidney disease: Secondary | ICD-10-CM | POA: Diagnosis not present

## 2021-08-27 DIAGNOSIS — D509 Iron deficiency anemia, unspecified: Secondary | ICD-10-CM | POA: Diagnosis not present

## 2021-08-27 DIAGNOSIS — Z992 Dependence on renal dialysis: Secondary | ICD-10-CM | POA: Diagnosis not present

## 2021-08-27 DIAGNOSIS — Z23 Encounter for immunization: Secondary | ICD-10-CM | POA: Diagnosis not present

## 2021-08-28 NOTE — Progress Notes (Addendum)
[place], [place] [address]   CLINIC:  Medical Oncology/Hematology  PCP:  Celene Squibb, MD [address] [place] [address] [phone]   REASON FOR VISIT:  Follow-up for MGUS and anemia of CKD   PRIOR THERAPY: Retacrit   CURRENT THERAPY: Epogen injections at dialysis   INTERVAL HISTORY:  Danny [name] 66 y.o. male returns for routine follow-up of his anemia of ESRD and MGUS.  He was last seen by Tarri Abernethy PA-C on 05/16/2021.   He was noted to have worsening anemia with CBC on 08/10/2021 showing Hgb 6.4/MCV 93.4.  Ferritin and iron saturation were normal.  He was scheduled to receive 2 units PRBC here at the [facility], but on arrival he was noted to have heart rate in the 160s with BP 73/42, therefore was sent to the ED and admitted to the hospital.  He received 2 units PRBC during hospital stay and CBC on 08/16/2021 was 9.1.  He is on Eliquis for atrial fibrillation.   As previously noted, patient was Hemoccult positive x3 in June 2022, but denies any obvious bleeding such as bright red blood per rectum, melena, or epistaxis.   He was evaluated by gastroenterology (Dr. Jenetta Downer) on 04/21/2021, and was scheduled for EGD/colonoscopy.  However, patient reports that this had to be canceled since he did not get the prep in time, and it has not been rescheduled.  He continues to reside at [facility]. He is receiving Epogen at dialysis. He was previously on B12 injections, but apparently this was switched to tablets by SNF.  Patient has previously failed to improve on B12 oral tablets.  He is taking iron tablet twice daily.  Patient reports extreme fatigue and dyspnea on exertion when he was severely anemic, prior to hospital stay.  His energy level is improved after transfusion, but he still gets fatigued with dialysis.  He denies any chest pain, dyspnea on exertion, syncope, or palpitations.  Regarding  his MGUS, he denies any bone pain or hyperviscosity symptoms. He has chronic diabetic neuropathy of feet, but denies any recent change in his symptoms. He denies B symptoms such as fever, chills, night sweats. He does not note any new lumps or bumps.   He has 75% energy and 100% appetite.  His weight has been stable.   REVIEW OF SYSTEMS:  Review of Systems  Constitutional:  Positive for fatigue. Negative for appetite change, chills, diaphoresis, fever and unexpected weight change.  HENT:   Negative for lump/mass and nosebleeds.   Eyes:  Negative for eye problems.  Respiratory:  Positive for cough. Negative for hemoptysis and shortness of breath.   Cardiovascular:  Negative for chest pain, leg swelling and palpitations.  Gastrointestinal:  Positive for constipation and nausea. Negative for abdominal pain, blood in stool, diarrhea and vomiting.  Genitourinary:  Positive for dysuria. Negative for hematuria.   Skin: Negative.   Neurological:  Positive for headaches and numbness. Negative for dizziness and light-headedness.  Hematological:  Does not bruise/bleed easily.     PAST MEDICAL/SURGICAL HISTORY:  Past Medical History:  Diagnosis Date   Anemia    Ankle ulcer ([place])    Arthritis    CKD (chronic kidney disease), stage V (HCC)    Diabetes mellitus without complication (HCC)    Diastolic congestive heart failure (HCC)    Foot ulcer ([place])    Gout    Hypertension    Urinary retention  Past Surgical History:  Procedure Laterality Date   ANKLE SURGERY Right    AV FISTULA PLACEMENT Left 04/26/2021   Procedure: LEFT ARM ARTERIOVENOUS (AV) FISTULA CREATION (BRACHIOCEPHALIC);  Surgeon: Rosetta Posner, MD;  Location: [facility];  Service: Vascular;  Laterality: Left;   CHOLECYSTECTOMY     FISTULA SUPERFICIALIZATION Left 07/12/2021   Procedure: LEFT ARM ARTERIOVENOUS FISTULA SUPERFICIALIZATION;  Surgeon: Rosetta Posner, MD;  Location: [facility];  Service: Vascular;  Laterality: Left;   FOOT  SURGERY Right    INSERTION OF DIALYSIS CATHETER Right 01/14/2021   Procedure: INSERTION OF TUNNELED DIALYSIS CATHETER RIGHT INTERNAL JUGULAR;  Surgeon: Virl Cagey, MD;  Location: [facility];  Service: General;  Laterality: Right;   TRANSURETHRAL RESECTION OF PROSTATE N/A 01/15/2020   Procedure: TRANSURETHRAL RESECTION OF THE PROSTATE (TURP)  with General anesthesia and spinal;  Surgeon: Cleon Gustin, MD;  Location: [facility];  Service: Urology;  Laterality: N/A;     SOCIAL HISTORY:  Social History   Socioeconomic History   Marital status: Divorced    Spouse name: Not on file   Number of children: Not on file   Years of education: Not on file   Highest education level: Not on file  Occupational History   Not on file  Tobacco Use   Smoking status: Former    Packs/day: 1.50    Years: 10.00    Pack years: 15.00    Types: Cigarettes    Quit date: 06/02/1987    Years since quitting: 34.2   Smokeless tobacco: Never  Vaping Use   Vaping Use: Never used  Substance and Sexual Activity   Alcohol use: Not Currently   Drug use: Never   Sexual activity: Not Currently  Other Topics Concern   Not on file  Social History Narrative   Not on file   Social Determinants of Health   Financial Resource Strain: Low Risk    Difficulty of Paying Living Expenses: Not very hard  Food Insecurity: No Food Insecurity   Worried About Running Out of Food in the Last Year: Never true   [place] in the Last Year: Never true  Transportation Needs: No Transportation Needs   Lack of Transportation (Medical): No   Lack of Transportation (Non-Medical): No  Physical Activity: Inactive   Days of Exercise per Week: 0 days   Minutes of Exercise per Session: 0 min  Stress: No Stress Concern Present   Feeling of Stress : Only a little  Social Connections: Not on file  Intimate Partner Violence: Not At Risk   Fear of Current or Ex-Partner: No   Emotionally Abused: No   Physically Abused: No    Sexually Abused: No    FAMILY HISTORY:  Family History  Problem Relation Age of Onset   Diabetes Mother    Heart attack Mother    Heart attack Father    Diabetes Brother     CURRENT MEDICATIONS:  Outpatient Encounter Medications as of 08/29/2021  Medication Sig Note   acetaminophen (TYLENOL) 500 MG tablet Take 1,000 mg by mouth every 6 (six) hours as needed for moderate pain or headache.    apixaban (ELIQUIS) 5 MG TABS tablet Take 1 tablet (5 mg total) by mouth 2 (two) times daily. (Patient not taking: Reported on 08/12/2021)    cholecalciferol (VITAMIN D3) 25 MCG (1000 UNIT) tablet Take by mouth daily.    collagenase (SANTYL) ointment Apply to right lateral malleolus once daily; apply in a 1/8  inch layer and top with saline moistened gauze 2X2. (Patient not taking: Reported on 08/12/2021) 05/11/2021: Last filled 05/04/21   docusate sodium (COLACE) 100 MG capsule Take 100 mg by mouth 2 (two) times daily.    fluticasone (FLONASE) 50 MCG/ACT nasal spray Place 2 sprays into both nostrils daily.    Insulin NPH Isophane & Regular (HUMULIN 70/30 KWIKPEN [place]) Inject 5 Units into the skin 2 (two) times daily.    Lactulose 20 GM/30ML SOLN Take 30 mLs (20 g total) by mouth daily. (Patient taking differently: Take 30 mLs by mouth 2 (two) times daily.)    metoprolol succinate (TOPROL XL) 50 MG 24 hr tablet Take 1 tablet (50 mg total) by mouth in the morning and at bedtime. Take with or immediately following a meal.    midodrine (PROAMATINE) 10 MG tablet Take 1 tablet by mouth. Every Harrell Lark and Saturdays    Multiple Vitamin (MULTIVITAMIN WITH MINERALS) TABS tablet Take 1 tablet by mouth daily.    Nutritional Supplements (FEEDING SUPPLEMENT, NEPRO CARB STEADY,) LIQD Take 237 mLs by mouth 2 (two) times daily between meals.    Omega-3 Fatty Acids (FISH OIL PO) Take 500 mg by mouth 2 (two) times daily.    ondansetron (ZOFRAN) 4 MG tablet Take 4 mg by mouth every 6 (six) hours. Prn.    oxycodone (OXY-IR) 5  MG capsule Take 1 capsule (5 mg total) by mouth every 6 (six) hours as needed (severe pain).    polyethylene glycol (MIRALAX / GLYCOLAX) 17 g packet Take 17 g by mouth daily.    rosuvastatin (CRESTOR) 5 MG tablet Take 5 mg by mouth daily.    senna (SENOKOT) 8.6 MG tablet Take 1 tablet by mouth 2 (two) times daily.    sevelamer carbonate (RENVELA) 800 MG tablet Take 1 tablet (800 mg total) by mouth 2 (two) times daily with a meal.    vitamin B-12 (CYANOCOBALAMIN) 1000 MCG tablet Take 1,000 mcg by mouth daily.    vitamin E 1000 UNIT capsule Take 1,000 Units by mouth daily.    Zinc 50 MG TABS Take 250 mg by mouth daily.    No facility-administered encounter medications on file as of 08/29/2021.    ALLERGIES:  Allergies  Allergen Reactions   Dust Mite Extract Itching and Other (See Comments)    Unknown reaction-potential shortness of breath   Prednisone Nausea And Vomiting   Rocephin [Ceftriaxone] Nausea And Vomiting     PHYSICAL EXAM:  ECOG PERFORMANCE STATUS: 2 - Symptomatic, <50% confined to bed  There were no vitals filed for this visit. There were no vitals filed for this visit. Physical Exam Constitutional:      Appearance: Normal appearance. He is obese.     Comments: Somewhat weak appearing.  Presents in wheelchair.  HENT:     Head: Normocephalic and atraumatic.     Mouth/Throat:     Mouth: Mucous membranes are moist.  Eyes:     Extraocular Movements: Extraocular movements intact.     Pupils: Pupils are equal, round, and reactive to light.  Cardiovascular:     Rate and Rhythm: Tachycardia present. Rhythm irregular.     Pulses: Normal pulses.     Heart sounds: Normal heart sounds.  Pulmonary:     Effort: Pulmonary effort is normal.     Breath sounds: Normal breath sounds.  Abdominal:     General: Bowel sounds are normal.     Palpations: Abdomen is soft.     Tenderness: There  is no abdominal tenderness.  Musculoskeletal:        General: No swelling.     Right lower  leg: No edema.     Left lower leg: No edema.  Lymphadenopathy:     Cervical: No cervical adenopathy.  Skin:    General: Skin is warm and dry.  Neurological:     General: No focal deficit present.     Mental Status: He is alert and oriented to person, place, and time.  Psychiatric:        Mood and Affect: Mood normal.        Behavior: Behavior normal.     LABORATORY DATA:  I have reviewed the labs as listed.  CBC    Component Value Date/Time   WBC 7.2 08/16/2021 1245   RBC 3.01 (L) 08/16/2021 1245   HGB 9.1 (L) 08/16/2021 1245   HCT 28.6 (L) 08/16/2021 1245   PLT 267 08/16/2021 1245   MCV 95.0 08/16/2021 1245   MCH 30.2 08/16/2021 1245   MCHC 31.8 08/16/2021 1245   RDW 17.8 (H) 08/16/2021 1245   LYMPHSABS 1.0 08/12/2021 0938   MONOABS 0.6 08/12/2021 0938   EOSABS 0.4 08/12/2021 0938   BASOSABS 0.1 08/12/2021 0938   CMP Latest Ref Rng & Units 08/16/2021 08/13/2021 08/13/2021  Glucose 70 - 99 mg/dL 208(H) 220(H) 221(H)  BUN 8 - 23 mg/dL 48(H) 39(H) 39(H)  Creatinine 0.61 - 1.24 mg/dL 4.21(H) 4.12(H) 4.07(H)  Sodium 135 - 145 mmol/L 134(L) 131(L) 132(L)  Potassium 3.5 - 5.1 mmol/L 4.0 4.0 4.0  Chloride 98 - 111 mmol/L 99 99 98  CO2 22 - 32 mmol/L _0 Calcium 8.9 - 10.3 mg/dL 8.3(L) 8.1(L) 8.2(L)  Total Protein 6.5 - 8.1 g/dL - - -  Total Bilirubin 0.3 - 1.2 mg/dL - - -  Alkaline Phos 38 - 126 U/L - - -  AST 15 - 41 U/L - - -  ALT 0 - 44 U/L - - -    DIAGNOSTIC IMAGING:  I have independently reviewed the relevant imaging and discussed with the patient.  ASSESSMENT & PLAN: 1.  Normocytic anemia - Multifactorial etiology related to ESRD and relative iron deficiency - No prior history of transfusion.  Has never had an EGD or colonoscopy. - Negative work-up for hemolysis (normal LDH, haptoglobin, reticulocyte count, direct [place]) - Nutritional deficiency work-up showed B12 deficiency (see below), but normal folic acid and copper; normal vitamin D - Patient  stopped taking oral iron supplement due to constipation - Patient is now receiving ESA injections at hemodialysis (Mircera 200 mcg every 2 weeks, equivalent to 20,000 units of Epogen every 2 weeks) - Found to have Hemoccult stool positive x3 in June 2022 - was referred to gastroenterology, but EGD/colonoscopy scheduled for October 2022 was canceled due to patient not receiving prep in time. - He takes Eliquis due to atrial fibrillation. - He denies any gross signs or symptoms of blood loss such as bright red blood per rectum or melena.   - He was noted to have worsening anemia with CBC on 08/10/2021 showing Hgb 6.4/MCV 93.4.  Ferritin and iron saturation were normal.  He was scheduled to receive 2 units PRBC here at the [facility], but on arrival he was noted to have heart rate in the 160s with BP 73/42, therefore was sent to the ED and admitted to the hospital.  He received 2 units PRBC during hospital stay and CBC on 08/16/2021 was 9.1. - Most  recent labs (08/10/2021): Ferritin 237, iron saturation 24% with normal TIBC.  Persistent B12 deficiency noted with elevated methylmalonic acid 1004 (B12 also elevated at 1777, likely acute phase reactant).  LDH normal.  Folate normal, but homocystine elevated at 19.1. - PLAN: Question if his worsening anemia is from (A) B12/folate deficiency, (B) suboptimal Epogen dosing, (C) acute on chronic blood loss, or (D) underlying bone marrow disorder. (a) B12 and folate supplementation as below  (b) Continue Epogen injections at dialysis.  If no improvement in his hemoglobin after optimizing B12 and folate, we will consider increased dose of Epogen.   (c) We will recheck stool cards x3.  Instructed patient to reach out to GI for follow-up and to reschedule EGD/colonoscopy.   (d) If he has persistent anemia despite the above interventions, we will consider bone marrow biopsy. - No indication for IV iron supplementation at this time.  Continue oral iron  supplementation. - Monthly CBC with sample to blood bank. - Repeat labs and RTC in 2 months.  2.  Monoclonal gammopathy of unknown significance, IgG lambda - Work-up for other causes of anemia above revealed immunofixation (09/13/2020) with IgG lambda monoclonal protein; no M spike evident on SPEP; elevated lambda light chains 163.5, elevated kappa light chains 186.6, normal light chain ratio 1.14 - Skeletal survey (11/16/2020): No definite lytic destruction or lucency noted in the visualized skeleton - Most recent MGUS/myeloma panel (08/10/2021): SPEP negative for M spike.  Elevated light chains (kappa 311, lambda 221) with normal light chain ratio (1.41) in keeping with ESRD on hemodialysis. - No definite CRAB features: Patient does have a markedly elevated creatinine due to longstanding CKD and poorly controlled diabetes/hypertension, anemia thought to be due to anemia of ESRD.  Most recent calcium 8.5 (08/10/2021).  LDH normal. - He denies any new bone pain or B symptoms  - 24-hour urine with UPEP/IFE was ordered, but unable to be completed initially as patient is incontinent of urine and wears adult briefs - PLAN: Repeat MGUS panel in 6 months. - We will check 24-hour urine via Foley catheter now the patient resides at SNF.  Foley catheter to be inserted at SNF, with strict instructions for to be removed after collection of 24-hour urine specimen.   3.  B12 and folate deficiency - He was previously on B12 pills, which were not helping - Patient was supposed to be started on monthly B12 injections, but due to frequent hospitalizations and changing SNF facilities, he has not received them - Most recent labs (08/10/2021): Persistent B12 deficiency noted with elevated methylmalonic acid 1004 (B12 also elevated at 1777, likely acute phase reactant).  LDH normal.  Folate normal, but homocystine elevated at 19.1. - Some question as to whether methylmalonic acid and homocystine elevations are due to true  vitamin deficiency or renal dysfunction - PLAN: Order sent to SNF for monthly B12 injections. - Recommend the patient start taking folic acid 488 mcg daily. - Repeat folate, homocystine, B12 and methylmalonic acid in 2 months.   4.  ESRD on hemodialysis - CKD since 2013, secondary to diabetes. - Started on hemodialysis in July 2022 - PLAN: Continue hemodialysis and follow-up with Dr. Theador Hawthorne   All questions were answered. The patient knows to call the clinic with any problems, questions or concerns.  Medical decision making: Moderate  Time spent on visit: I spent 25 minutes counseling the patient face to face. The total time spent in the appointment was 40 minutes and more than 50% was on  counseling.   [name], PA-C  08/29/2021 10:49 AM

## 2021-08-29 ENCOUNTER — Inpatient Hospital Stay (HOSPITAL_COMMUNITY): Payer: Medicare Other

## 2021-08-29 ENCOUNTER — Other Ambulatory Visit: Payer: Self-pay

## 2021-08-29 ENCOUNTER — Ambulatory Visit (INDEPENDENT_AMBULATORY_CARE_PROVIDER_SITE_OTHER): Payer: Medicare HMO | Admitting: Gastroenterology

## 2021-08-29 ENCOUNTER — Inpatient Hospital Stay (HOSPITAL_COMMUNITY): Payer: Medicare Other | Attending: Physician Assistant | Admitting: Physician Assistant

## 2021-08-29 ENCOUNTER — Other Ambulatory Visit (HOSPITAL_COMMUNITY): Payer: Self-pay | Admitting: *Deleted

## 2021-08-29 VITALS — BP 135/74 | HR 102 | Temp 96.2°F | Resp 20

## 2021-08-29 DIAGNOSIS — I5032 Chronic diastolic (congestive) heart failure: Secondary | ICD-10-CM | POA: Diagnosis not present

## 2021-08-29 DIAGNOSIS — D649 Anemia, unspecified: Secondary | ICD-10-CM

## 2021-08-29 DIAGNOSIS — D472 Monoclonal gammopathy: Secondary | ICD-10-CM | POA: Diagnosis not present

## 2021-08-29 DIAGNOSIS — Z7901 Long term (current) use of anticoagulants: Secondary | ICD-10-CM | POA: Diagnosis not present

## 2021-08-29 DIAGNOSIS — D631 Anemia in chronic kidney disease: Secondary | ICD-10-CM | POA: Diagnosis not present

## 2021-08-29 DIAGNOSIS — Z992 Dependence on renal dialysis: Secondary | ICD-10-CM | POA: Diagnosis not present

## 2021-08-29 DIAGNOSIS — R2681 Unsteadiness on feet: Secondary | ICD-10-CM | POA: Diagnosis not present

## 2021-08-29 DIAGNOSIS — D508 Other iron deficiency anemias: Secondary | ICD-10-CM

## 2021-08-29 DIAGNOSIS — E538 Deficiency of other specified B group vitamins: Secondary | ICD-10-CM | POA: Diagnosis not present

## 2021-08-29 DIAGNOSIS — E1122 Type 2 diabetes mellitus with diabetic chronic kidney disease: Secondary | ICD-10-CM | POA: Diagnosis not present

## 2021-08-29 DIAGNOSIS — I132 Hypertensive heart and chronic kidney disease with heart failure and with stage 5 chronic kidney disease, or end stage renal disease: Secondary | ICD-10-CM | POA: Insufficient documentation

## 2021-08-29 DIAGNOSIS — I480 Paroxysmal atrial fibrillation: Secondary | ICD-10-CM | POA: Diagnosis not present

## 2021-08-29 DIAGNOSIS — N186 End stage renal disease: Secondary | ICD-10-CM

## 2021-08-29 DIAGNOSIS — M6281 Muscle weakness (generalized): Secondary | ICD-10-CM | POA: Diagnosis not present

## 2021-08-29 DIAGNOSIS — I4891 Unspecified atrial fibrillation: Secondary | ICD-10-CM | POA: Insufficient documentation

## 2021-08-29 LAB — CBC WITH DIFFERENTIAL/PLATELET
Abs Immature Granulocytes: 0.02 10*3/uL (ref 0.00–0.07)
Basophils Absolute: 0.1 10*3/uL (ref 0.0–0.1)
Basophils Relative: 1 %
Eosinophils Absolute: 0.4 10*3/uL (ref 0.0–0.5)
Eosinophils Relative: 5 %
HCT: 33.8 % — ABNORMAL LOW (ref 39.0–52.0)
Hemoglobin: 10.5 g/dL — ABNORMAL LOW (ref 13.0–17.0)
Immature Granulocytes: 0 %
Lymphocytes Relative: 13 %
Lymphs Abs: 1.1 10*3/uL (ref 0.7–4.0)
MCH: 29.1 pg (ref 26.0–34.0)
MCHC: 31.1 g/dL (ref 30.0–36.0)
MCV: 93.6 fL (ref 80.0–100.0)
Monocytes Absolute: 0.5 10*3/uL (ref 0.1–1.0)
Monocytes Relative: 6 %
Neutro Abs: 6 10*3/uL (ref 1.7–7.7)
Neutrophils Relative %: 75 %
Platelets: 269 10*3/uL (ref 150–400)
RBC: 3.61 MIL/uL — ABNORMAL LOW (ref 4.22–5.81)
RDW: 17.1 % — ABNORMAL HIGH (ref 11.5–15.5)
WBC: 8.1 10*3/uL (ref 4.0–10.5)
nRBC: 0 % (ref 0.0–0.2)

## 2021-08-29 LAB — SAMPLE TO BLOOD BANK

## 2021-08-29 MED ORDER — CYANOCOBALAMIN 1000 MCG/ML IJ SOLN: 1000.0000 ug | Freq: Once | INTRAMUSCULAR | 0 refills | Status: AC

## 2021-08-29 MED ORDER — FOLIC ACID 400 MCG PO TABS: 400.0000 ug | ORAL_TABLET | Freq: Every day | ORAL | 3 refills | Status: DC

## 2021-08-29 NOTE — Progress Notes (Signed)
BB order in ?

## 2021-08-29 NOTE — Patient Instructions (Signed)
Danny [name] at [facility] ?Discharge Instructions ? ?You were seen today by Tarri Abernethy PA-C for your anemia and your MGUS.   ? ?ANEMIA: Your blood levels decreased significantly in February 2023.  They are improved today with hemoglobin 10.5.  We do not know why your blood level dropped so low, so I suggest the following... ?Restart B12 injections once a month (instead of once daily pill) ?Start taking folic acid 696 mcg daily. ?Check stool cards x3 to see if you are still having blood loss in your bowel movements. ?Follow-up with gastroenterology (Dr. Jenetta Downer) to complete the EGD and colonoscopy that were canceled in October 2022. ?We will check your blood once a month. ?Follow-up visit in 2 months.  If your blood levels are still staying low, we will consider increasing the dose of Epogen you received at dialysis.  If that does not work to increase your blood counts, we would consider bone marrow biopsy. ? ?MGUS: Your MGUS-protein levels are stable and do not show any signs of progression to myeloma cancer at this time. ?We will request for [facility] to check 24-hour urine study using temporary urine catheter. ? ?FOLLOW-UP APPOINTMENT: Follow-up visit in 2 months ? ? ?Thank you for choosing [place] at [facility] to provide your oncology and hematology care.  To afford each patient quality time with our provider, please arrive at least 15 minutes before your scheduled appointment time.  ? ?If you have a lab appointment with the [facility] please come in thru the Main Entrance and check in at the main information desk. ? ?You need to re-schedule your appointment should you arrive 10 or more minutes late.  We strive to give you quality time with our providers, and arriving late affects you and other patients whose appointments are after yours.  Also, if you no show three or more times for appointments you may be dismissed from the clinic at the  providers discretion.     ?Again, thank you for choosing [facility]  Our hope is that these requests will decrease the amount of time that you wait before being seen by our physicians.       ?_____________________________________________________________ ? ?Should you have questions after your visit to [facility], please contact our office at [phone] and follow the prompts.  Our office hours are 8:00 a.m. and 4:30 p.m. Monday - Friday.  Please note that voicemails left after 4:00 p.m. may not be returned until the following business day.  We are closed weekends and major holidays.  You do have access to a nurse 24-7, just call the main number to the clinic [phone] and do not press any options, hold on the line and a nurse will answer the phone.   ? ?For prescription refill requests, have your pharmacy contact our office and allow 72 hours.   ? ?Due to Covid, you will need to wear a mask upon entering the hospital. If you do not have a mask, a mask will be given to you at the Main Entrance upon arrival. For doctor visits, patients may have 1 support person age 4 or older with them. For treatment visits, patients can not have anyone with them due to social distancing guidelines and our immunocompromised population.  ? ? ? ?

## 2021-08-30 DIAGNOSIS — N186 End stage renal disease: Secondary | ICD-10-CM | POA: Diagnosis not present

## 2021-08-30 DIAGNOSIS — M6281 Muscle weakness (generalized): Secondary | ICD-10-CM | POA: Diagnosis not present

## 2021-08-30 DIAGNOSIS — D509 Iron deficiency anemia, unspecified: Secondary | ICD-10-CM | POA: Diagnosis not present

## 2021-08-30 DIAGNOSIS — D631 Anemia in chronic kidney disease: Secondary | ICD-10-CM | POA: Diagnosis not present

## 2021-08-30 DIAGNOSIS — R2681 Unsteadiness on feet: Secondary | ICD-10-CM | POA: Diagnosis not present

## 2021-08-30 DIAGNOSIS — Z23 Encounter for immunization: Secondary | ICD-10-CM | POA: Diagnosis not present

## 2021-08-30 DIAGNOSIS — Z992 Dependence on renal dialysis: Secondary | ICD-10-CM | POA: Diagnosis not present

## 2021-08-30 DIAGNOSIS — I480 Paroxysmal atrial fibrillation: Secondary | ICD-10-CM | POA: Diagnosis not present

## 2021-08-31 ENCOUNTER — Telehealth (HOSPITAL_COMMUNITY): Payer: Self-pay | Admitting: *Deleted

## 2021-08-31 ENCOUNTER — Other Ambulatory Visit (HOSPITAL_COMMUNITY): Payer: Self-pay | Admitting: *Deleted

## 2021-08-31 DIAGNOSIS — D508 Other iron deficiency anemias: Secondary | ICD-10-CM

## 2021-08-31 DIAGNOSIS — I480 Paroxysmal atrial fibrillation: Secondary | ICD-10-CM | POA: Diagnosis not present

## 2021-08-31 DIAGNOSIS — D472 Monoclonal gammopathy: Secondary | ICD-10-CM | POA: Diagnosis not present

## 2021-08-31 DIAGNOSIS — R2681 Unsteadiness on feet: Secondary | ICD-10-CM | POA: Diagnosis not present

## 2021-08-31 DIAGNOSIS — M6281 Muscle weakness (generalized): Secondary | ICD-10-CM | POA: Diagnosis not present

## 2021-08-31 LAB — OCCULT BLOOD X 1 CARD TO LAB, STOOL
Fecal Occult Bld: NEGATIVE
Fecal Occult Bld: NEGATIVE
Fecal Occult Bld: NEGATIVE

## 2021-08-31 NOTE — Telephone Encounter (Signed)
Received telephone call from [facility], who is caring for patient today.  She has collected 24 hour urine and 3 stool cards.  Orders released and requisitions faxed to her and instructed to take to [facility] lab.  Verified with Ebony Hail that there are patient labels on each item.  24 hour urine due to finish up at 3:30 today and they will deliver to lab. ?

## 2021-09-01 ENCOUNTER — Telehealth: Payer: Self-pay | Admitting: Vascular Surgery

## 2021-09-01 ENCOUNTER — Other Ambulatory Visit: Payer: Self-pay

## 2021-09-01 DIAGNOSIS — Z23 Encounter for immunization: Secondary | ICD-10-CM | POA: Diagnosis not present

## 2021-09-01 DIAGNOSIS — D631 Anemia in chronic kidney disease: Secondary | ICD-10-CM | POA: Diagnosis not present

## 2021-09-01 DIAGNOSIS — N2581 Secondary hyperparathyroidism of renal origin: Secondary | ICD-10-CM | POA: Diagnosis not present

## 2021-09-01 DIAGNOSIS — N186 End stage renal disease: Secondary | ICD-10-CM | POA: Diagnosis not present

## 2021-09-01 DIAGNOSIS — D509 Iron deficiency anemia, unspecified: Secondary | ICD-10-CM | POA: Diagnosis not present

## 2021-09-01 DIAGNOSIS — Z992 Dependence on renal dialysis: Secondary | ICD-10-CM | POA: Diagnosis not present

## 2021-09-01 NOTE — Telephone Encounter (Signed)
Danny [name] with dialysis center called would like [place] to arrange with patient cath removal next tuesday. spoke with Dr Donnetta Hutching Via phone he said it was ok to put on procedure schedule. ?

## 2021-09-02 LAB — UPEP/UIFE/LIGHT CHAINS/TP, 24-HR UR
% BETA, Urine: 23.3 %
ALPHA 1 URINE: 6.5 %
Albumin, U: 39 %
Alpha 2, Urine: 8.3 %
Free Kappa Lt Chains,Ur: 842.59 mg/L — ABNORMAL HIGH (ref 1.17–86.46)
Free Kappa/Lambda Ratio: 3.36 (ref 1.83–14.26)
Free Lambda Lt Chains,Ur: 250.47 mg/L — ABNORMAL HIGH (ref 0.27–15.21)
GAMMA GLOBULIN URINE: 22.9 %
Total Protein, Urine-Ur/day: 3070 mg/24 hr — ABNORMAL HIGH (ref 30–150)
Total Protein, Urine: 231.7 mg/dL
Total Volume: 1325

## 2021-09-03 DIAGNOSIS — D509 Iron deficiency anemia, unspecified: Secondary | ICD-10-CM | POA: Diagnosis not present

## 2021-09-03 DIAGNOSIS — N186 End stage renal disease: Secondary | ICD-10-CM | POA: Diagnosis not present

## 2021-09-03 DIAGNOSIS — Z23 Encounter for immunization: Secondary | ICD-10-CM | POA: Diagnosis not present

## 2021-09-03 DIAGNOSIS — Z992 Dependence on renal dialysis: Secondary | ICD-10-CM | POA: Diagnosis not present

## 2021-09-03 DIAGNOSIS — D631 Anemia in chronic kidney disease: Secondary | ICD-10-CM | POA: Diagnosis not present

## 2021-09-05 ENCOUNTER — Encounter (HOSPITAL_COMMUNITY): Admission: RE | Admit: 2021-09-05 | Payer: Medicare Other | Source: Ambulatory Visit

## 2021-09-06 ENCOUNTER — Ambulatory Visit (HOSPITAL_COMMUNITY)
Admission: RE | Admit: 2021-09-06 | Discharge: 2021-09-06 | Disposition: A | Discharge status: Skilled Nursing Facility | Payer: Medicare Other | Source: Ambulatory Visit | Attending: Vascular Surgery | Admitting: Vascular Surgery

## 2021-09-06 ENCOUNTER — Encounter (HOSPITAL_COMMUNITY)
Admission: RE | Disposition: A | Discharge status: Skilled Nursing Facility | Payer: Self-pay | Source: Ambulatory Visit | Attending: Vascular Surgery

## 2021-09-06 DIAGNOSIS — Z992 Dependence on renal dialysis: Secondary | ICD-10-CM | POA: Diagnosis not present

## 2021-09-06 DIAGNOSIS — N186 End stage renal disease: Secondary | ICD-10-CM | POA: Diagnosis not present

## 2021-09-06 DIAGNOSIS — R2681 Unsteadiness on feet: Secondary | ICD-10-CM | POA: Diagnosis not present

## 2021-09-06 DIAGNOSIS — D631 Anemia in chronic kidney disease: Secondary | ICD-10-CM | POA: Diagnosis not present

## 2021-09-06 DIAGNOSIS — I480 Paroxysmal atrial fibrillation: Secondary | ICD-10-CM | POA: Diagnosis not present

## 2021-09-06 DIAGNOSIS — D509 Iron deficiency anemia, unspecified: Secondary | ICD-10-CM | POA: Diagnosis not present

## 2021-09-06 DIAGNOSIS — Z23 Encounter for immunization: Secondary | ICD-10-CM | POA: Diagnosis not present

## 2021-09-06 DIAGNOSIS — M6281 Muscle weakness (generalized): Secondary | ICD-10-CM | POA: Diagnosis not present

## 2021-09-06 SURGERY — REMOVAL, DIALYSIS CATHETER
Anesthesia: LOCAL

## 2021-09-06 MED ORDER — LIDOCAINE HCL (PF) 1 % IJ SOLN
INTRAMUSCULAR | Status: DC | PRN
  Administered 2021-09-06: 8 mL via INTRADERMAL

## 2021-09-06 MED ORDER — LIDOCAINE HCL (PF) 1 % IJ SOLN
INTRAMUSCULAR | Status: AC
  Filled 2021-09-06: qty 30

## 2021-09-06 SURGICAL SUPPLY — 21 items
APPLICATOR CHLORAPREP 10.5 ORG (MISCELLANEOUS) ×2 IMPLANT
CLOTH BEACON ORANGE TIMEOUT ST (SAFETY) ×2 IMPLANT
DECANTER SPIKE VIAL GLASS SM (MISCELLANEOUS) ×2 IMPLANT
DERMABOND ADVANCED (GAUZE/BANDAGES/DRESSINGS) ×1
DERMABOND ADVANCED .7 DNX12 (GAUZE/BANDAGES/DRESSINGS) ×1 IMPLANT
DRAPE HALF SHEET 40X57 (DRAPES) IMPLANT
ELECT REM PT RETURN 9FT ADLT (ELECTROSURGICAL) ×2
ELECTRODE REM PT RTRN 9FT ADLT (ELECTROSURGICAL) ×1 IMPLANT
GLOVE SURG POLYISO LF SZ7.5 (GLOVE) IMPLANT
GLOVE SURG UNDER POLY LF SZ7 (GLOVE) IMPLANT
GOWN STRL REUS W/TWL LRG LVL3 (GOWN DISPOSABLE) IMPLANT
NDL HYPO 25X1 1.5 SAFETY (NEEDLE) ×1 IMPLANT
NEEDLE HYPO 25X1 1.5 SAFETY (NEEDLE) ×2 IMPLANT
PENCIL SMOKE EVACUATOR COATED (MISCELLANEOUS) IMPLANT
SPONGE GAUZE 2X2 8PLY STRL LF (GAUZE/BANDAGES/DRESSINGS) ×2 IMPLANT
SUT MNCRL AB 4-0 PS2 18 (SUTURE) ×2 IMPLANT
SUT VIC AB 3-0 SH 27 (SUTURE) ×1
SUT VIC AB 3-0 SH 27X BRD (SUTURE) ×1 IMPLANT
SUT VICRYL 3 0 (SUTURE) ×1 IMPLANT
SYR CONTROL 10ML LL (SYRINGE) ×2 IMPLANT
TOWEL OR 17X26 4PK STRL BLUE (TOWEL DISPOSABLE) ×2 IMPLANT

## 2021-09-06 NOTE — Interval H&P Note (Signed)
History and Physical Interval Note: ? ?09/06/2021 ?2:35 PM ? ?Danny [name]  has presented today for surgery, with the diagnosis of ESRD.  The various methods of treatment have been discussed with the patient and family. After consideration of risks, benefits and other options for treatment, the patient has consented to  Procedure(s): ?MINOR REMOVAL OF TUNNELED DIALYSIS CATHETER (N/A) as a surgical intervention.  The patient's history has been reviewed, patient examined, no change in status, stable for surgery.  I have reviewed the patient's chart and labs.  Questions were answered to the patient's satisfaction.   ? ? ?[name] ? ? ?

## 2021-09-06 NOTE — Op Note (Signed)
? ? ?  OPERATIVE REPORT ? ?DATE OF SURGERY: 09/06/2021 ? ?PATIENT: Danny [name], 66 y.o. male ?MRN: [id number]  ?DOB: 1956-01-22 ? ?PRE-OPERATIVE DIAGNOSIS: End-stage renal disease with functioning left upper arm AV fistula ? ?POST-OPERATIVE DIAGNOSIS:  Same ? ?PROCEDURE: Removal of right IJ tunneled hemodialysis catheter ? ?SURGEON:  Curt Jews, M.D. ? ?PHYSICIAN ASSISTANT: Nurse ? ?The assistant was needed for exposure and to expedite the case ? ?ANESTHESIA: 1% lidocaine local ? ?EBL: per anesthesia record ? ?No intake/output data recorded. ? ?BLOOD ADMINISTERED: none ? ?DRAINS: none ? ?SPECIMEN: none ? ?COUNTS CORRECT:  YES ? ?PATIENT DISPOSITION:  PACU - hemodynamically stable ? ?PROCEDURE DETAILS: ?Patient was taken to the minor procedure room.  The area of the catheter and neck and chest were prepped draped you sterile fashion.  1% lidocaine local was used to infiltrate around the catheter exit site and the catheter tunnel.  Hemostat was used to dissected around the catheter up to the Dacron felt cuff and this was mobilized from the catheter.  The catheter was removed in its entirety including the cuff and pressure was held for hemostasis.  A sterile dressing was applied and the patient was discharged home ? ? ?Rosetta Posner, M.D., FACS ?09/06/2021 ?2:35 PM ? ?Note: Portions of this report may have been transcribed using voice recognition software.  Every effort has been made to ensure accuracy; however, inadvertent computerized transcription errors may still be present. ? ? ?

## 2021-09-07 DIAGNOSIS — N185 Chronic kidney disease, stage 5: Secondary | ICD-10-CM

## 2021-09-07 DIAGNOSIS — E11622 Type 2 diabetes mellitus with other skin ulcer: Secondary | ICD-10-CM | POA: Diagnosis not present

## 2021-09-07 DIAGNOSIS — M6281 Muscle weakness (generalized): Secondary | ICD-10-CM | POA: Diagnosis not present

## 2021-09-07 DIAGNOSIS — L89520 Pressure ulcer of left ankle, unstageable: Secondary | ICD-10-CM | POA: Diagnosis not present

## 2021-09-07 DIAGNOSIS — R2681 Unsteadiness on feet: Secondary | ICD-10-CM | POA: Diagnosis not present

## 2021-09-07 DIAGNOSIS — L89513 Pressure ulcer of right ankle, stage 3: Secondary | ICD-10-CM | POA: Diagnosis not present

## 2021-09-07 DIAGNOSIS — I480 Paroxysmal atrial fibrillation: Secondary | ICD-10-CM | POA: Diagnosis not present

## 2021-09-07 DIAGNOSIS — E669 Obesity, unspecified: Secondary | ICD-10-CM | POA: Diagnosis not present

## 2021-09-08 ENCOUNTER — Encounter (HOSPITAL_COMMUNITY): Payer: Self-pay | Admitting: Vascular Surgery

## 2021-09-08 DIAGNOSIS — R2681 Unsteadiness on feet: Secondary | ICD-10-CM | POA: Diagnosis not present

## 2021-09-08 DIAGNOSIS — I480 Paroxysmal atrial fibrillation: Secondary | ICD-10-CM | POA: Diagnosis not present

## 2021-09-08 DIAGNOSIS — N186 End stage renal disease: Secondary | ICD-10-CM | POA: Diagnosis not present

## 2021-09-08 DIAGNOSIS — D631 Anemia in chronic kidney disease: Secondary | ICD-10-CM | POA: Diagnosis not present

## 2021-09-08 DIAGNOSIS — M6281 Muscle weakness (generalized): Secondary | ICD-10-CM | POA: Diagnosis not present

## 2021-09-08 DIAGNOSIS — Z992 Dependence on renal dialysis: Secondary | ICD-10-CM | POA: Diagnosis not present

## 2021-09-08 DIAGNOSIS — D509 Iron deficiency anemia, unspecified: Secondary | ICD-10-CM | POA: Diagnosis not present

## 2021-09-08 DIAGNOSIS — Z23 Encounter for immunization: Secondary | ICD-10-CM | POA: Diagnosis not present

## 2021-09-09 ENCOUNTER — Encounter: Payer: Self-pay | Admitting: Urology

## 2021-09-09 ENCOUNTER — Other Ambulatory Visit: Payer: Self-pay

## 2021-09-09 ENCOUNTER — Ambulatory Visit (INDEPENDENT_AMBULATORY_CARE_PROVIDER_SITE_OTHER): Payer: Medicare Other | Admitting: Urology

## 2021-09-09 VITALS — BP 117/69 | HR 73

## 2021-09-09 DIAGNOSIS — R339 Retention of urine, unspecified: Secondary | ICD-10-CM | POA: Diagnosis not present

## 2021-09-09 DIAGNOSIS — I480 Paroxysmal atrial fibrillation: Secondary | ICD-10-CM | POA: Diagnosis not present

## 2021-09-09 DIAGNOSIS — M6281 Muscle weakness (generalized): Secondary | ICD-10-CM | POA: Diagnosis not present

## 2021-09-09 DIAGNOSIS — R2681 Unsteadiness on feet: Secondary | ICD-10-CM | POA: Diagnosis not present

## 2021-09-09 DIAGNOSIS — N401 Enlarged prostate with lower urinary tract symptoms: Secondary | ICD-10-CM | POA: Diagnosis not present

## 2021-09-09 DIAGNOSIS — N138 Other obstructive and reflux uropathy: Secondary | ICD-10-CM

## 2021-09-09 LAB — BLADDER SCAN AMB NON-IMAGING: Scan Result: 355

## 2021-09-09 MED ORDER — TAMSULOSIN HCL 0.4 MG PO CAPS: 0.4000 mg | ORAL_CAPSULE | Freq: Every day | ORAL | 11 refills | Status: DC

## 2021-09-09 NOTE — Progress Notes (Signed)
? ?09/09/2021 ?11:47 AM  ? ?Danny [name] ?1955-08-23 ?[id number] ? ?Referring provider: Celene Squibb, MD ?[address] ?[address] ?[place],  [place] [address] ? ?Followup urinary retention ? ? ?HPI: ?Mr Danny [name] is a 66yo here for followup for BPH with Urinary retention. PVR 355. He notes worsening urinary urgency and frequency since last visit. He has to sit to urinate. IPSS 15 QOL 3. His urine stream is weak with standing. He has ESRD and had a vascular graft placed.  No other complaints today  ? ? ?PMH: ?Past Medical History:  ?Diagnosis Date  ? Anemia   ? Ankle ulcer ([place])   ? Arthritis   ? CKD (chronic kidney disease), stage V ([place])   ? Diabetes mellitus without complication ([place])   ? Diastolic congestive heart failure ([place])   ? Foot ulcer ([place])   ? Gout   ? Hypertension   ? Urinary retention   ? ? ?Surgical History: ?Past Surgical History:  ?Procedure Laterality Date  ? ANKLE SURGERY Right   ? AV FISTULA PLACEMENT Left 04/26/2021  ? Procedure: LEFT ARM ARTERIOVENOUS (AV) FISTULA CREATION (BRACHIOCEPHALIC);  Surgeon: Rosetta Posner, MD;  Location: [facility];  Service: Vascular;  Laterality: Left;  ? CHOLECYSTECTOMY    ? FISTULA SUPERFICIALIZATION Left 07/12/2021  ? Procedure: LEFT ARM ARTERIOVENOUS FISTULA SUPERFICIALIZATION;  Surgeon: Rosetta Posner, MD;  Location: [facility];  Service: Vascular;  Laterality: Left;  ? FOOT SURGERY Right   ? INSERTION OF DIALYSIS CATHETER Right 01/14/2021  ? Procedure: INSERTION OF TUNNELED DIALYSIS CATHETER RIGHT INTERNAL JUGULAR;  Surgeon: Virl Cagey, MD;  Location: [facility];  Service: General;  Laterality: Right;  ? REMOVAL OF A DIALYSIS CATHETER N/A 09/06/2021  ? Procedure: MINOR REMOVAL OF TUNNELED DIALYSIS CATHETER;  Surgeon: Rosetta Posner, MD;  Location: [facility];  Service: Vascular;  Laterality: N/A;  ? TRANSURETHRAL RESECTION OF PROSTATE N/A 01/15/2020  ? Procedure: TRANSURETHRAL RESECTION OF THE PROSTATE (TURP)  with General anesthesia and spinal;  Surgeon: Cleon Gustin, MD;   Location: [facility];  Service: Urology;  Laterality: N/A;  ? ? ?Home Medications:  ?Allergies as of 09/09/2021   ? ?   Reactions  ? Dust Mite Extract Itching, Other (See Comments)  ? Unknown reaction-potential shortness of breath  ? Prednisone Nausea And Vomiting  ? Rocephin [ceftriaxone] Nausea And Vomiting  ? ?  ? ?  ?Medication List  ?  ? ?  ? Accurate as of September 09, 2021 11:47 AM. If you have any questions, ask your nurse or doctor.  ?  ?  ? ?  ? ?acetaminophen 500 MG tablet ?Commonly known as: TYLENOL ?Take 1,000 mg by mouth every 6 (six) hours as needed for moderate pain or headache. ?  ?apixaban 5 MG Tabs tablet ?Commonly known as: ELIQUIS ?Take 1 tablet (5 mg total) by mouth 2 (two) times daily. ?  ?B-12 Compliance Injection 1000 MCG/ML Kit ?Generic drug: Cyanocobalamin ?Inject 1,000 mcg as directed every 28 (twenty-eight) days. ?  ?cholecalciferol 25 MCG (1000 UNIT) tablet ?Commonly known as: VITAMIN D3 ?Take 1,000 Units by mouth daily. ?  ?docusate sodium 100 MG capsule ?Commonly known as: COLACE ?Take 100 mg by mouth 2 (two) times daily. ?  ?feeding supplement (NEPRO CARB STEADY) Liqd ?Take 237 mLs by mouth 2 (two) times daily between meals. ?  ?ferrous sulfate 325 (65 FE) MG tablet ?Take 325 mg by mouth 2 (two) times daily. ?  ?Fish Oil 500 MG Caps ?Take  500 mg by mouth 2 (two) times daily. ?  ?fluticasone 50 MCG/ACT nasal spray ?Commonly known as: FLONASE ?Place 2 sprays into both nostrils daily. ?  ?folic acid 891 MCG tablet ?Commonly known as: FOLVITE ?Take 1 tablet (400 mcg total) by mouth daily. ?  ?HUMULIN 70/30 KWIKPEN [place] ?Inject 5 Units into the skin 2 (two) times daily. ?  ?Lactulose 20 GM/30ML Soln ?Take 30 mLs (20 g total) by mouth daily. ?What changed: when to take this ?  ?metoprolol succinate 50 MG 24 hr tablet ?Commonly known as: Toprol XL ?Take 1 tablet (50 mg total) by mouth in the morning and at bedtime. Take with or immediately following a meal. ?  ?midodrine 10 MG tablet ?Commonly known  as: PROAMATINE ?Take 1 tablet by mouth Every Tuesday,Thursday,and Saturday with dialysis. ?  ?multivitamin with minerals Tabs tablet ?Take 1 tablet by mouth daily. ?  ?ondansetron 4 MG tablet ?Commonly known as: ZOFRAN ?Take 4 mg by mouth every 6 (six) hours as needed for nausea or vomiting. ?  ?oxycodone 5 MG capsule ?Commonly known as: OXY-IR ?Take 1 capsule (5 mg total) by mouth every 6 (six) hours as needed (severe pain). ?  ?polyethylene glycol 17 g packet ?Commonly known as: MIRALAX / GLYCOLAX ?Take 17 g by mouth daily. ?  ?PROTEIN PO ?Take 30 mLs by mouth 3 (three) times daily. ?  ?rosuvastatin 5 MG tablet ?Commonly known as: CRESTOR ?Take 5 mg by mouth every evening. ?  ?senna 8.6 MG tablet ?Commonly known as: SENOKOT ?Take 2 tablets by mouth 2 (two) times daily. ?  ?sevelamer carbonate 800 MG tablet ?Commonly known as: RENVELA ?Take 1 tablet (800 mg total) by mouth 2 (two) times daily with a meal. ?  ?sodium phosphate 7-19 GM/118ML [place] ?Place 1 enema rectally daily as needed for severe constipation. ?  ?vitamin C 500 MG tablet ?Commonly known as: ASCORBIC ACID ?Take 1,000 mg by mouth 2 (two) times daily. ?  ?vitamin E 1000 UNIT capsule ?Take 1,000 Units by mouth daily. ?  ?zinc sulfate 220 (50 Zn) MG capsule ?Take 220 mg by mouth daily. ?  ? ?  ? ? ?Allergies:  ?Allergies  ?Allergen Reactions  ? Dust Mite Extract Itching and Other (See Comments)  ?  Unknown reaction-potential shortness of breath  ? Prednisone Nausea And Vomiting  ? Rocephin [Ceftriaxone] Nausea And Vomiting  ? ? ?Family History: ?Family History  ?Problem Relation Age of Onset  ? Diabetes Mother   ? Heart attack Mother   ? Heart attack Father   ? Diabetes Brother   ? ? ?Social History:  reports that he quit smoking about 34 years ago. His smoking use included cigarettes. He has a 15.00 pack-year smoking history. He has never used smokeless tobacco. He reports that he does not currently use alcohol. He reports that he does not use  drugs. ? ?ROS: ?All other review of systems were reviewed and are negative except what is noted above in HPI ? ?Physical Exam: ?BP 117/69   Pulse 73   ?Constitutional:  Alert and oriented, No acute distress. ?HEENT: [place] AT, moist mucus membranes.  Trachea midline, no masses. ?Cardiovascular: No clubbing, cyanosis, or edema. ?Respiratory: Normal respiratory effort, no increased work of breathing. ?GI: Abdomen is soft, nontender, nondistended, no abdominal masses ?GU: No CVA tenderness.  ?Lymph: No cervical or inguinal lymphadenopathy. ?Skin: No rashes, bruises or suspicious lesions. ?Neurologic: Grossly intact, no focal deficits, moving all 4 extremities. ?Psychiatric: Normal mood and affect. ? ?  Laboratory Data: ?Lab Results  ?Component Value Date  ? WBC 8.1 08/29/2021  ? HGB 10.5 (L) 08/29/2021  ? HCT 33.8 (L) 08/29/2021  ? MCV 93.6 08/29/2021  ? PLT 269 08/29/2021  ? ? ?Lab Results  ?Component Value Date  ? CREATININE 4.21 (H) 08/16/2021  ? ? ?No results found for: PSA ? ?No results found for: TESTOSTERONE ? ?Lab Results  ?Component Value Date  ? HGBA1C 6.8 (H) 01/12/2021  ? ? ?Urinalysis ?   ?Component Value Date/Time  ? [place] (A) 01/20/2021 1114  ? APPEARANCEUR TURBID (A) 01/20/2021 1114  ? APPEARANCEUR Clear 09/10/2020 1122  ? LABSPEC 1.007 01/20/2021 1114  ? PHURINE 9.0 (H) 01/20/2021 1114  ? GLUCOSEU NEGATIVE 01/20/2021 1114  ? HGBUR SMALL (A) 01/20/2021 1114  ? [place] NEGATIVE 01/20/2021 1114  ? BILIRUBINUR Negative 09/10/2020 1122  ? [place] NEGATIVE 01/20/2021 1114  ? PROTEINUR >=300 (A) 01/20/2021 1114  ? NITRITE NEGATIVE 01/20/2021 1114  ? LEUKOCYTESUR LARGE (A) 01/20/2021 1114  ? ? ?Lab Results  ?Component Value Date  ? LABMICR See below: 09/10/2020  ? [place] None seen 09/10/2020  ? LABEPIT None seen 09/10/2020  ? MUCUS Present 09/10/2020  ? BACTERIA FEW (A) 01/20/2021  ? ? ?Pertinent Imaging: ? ?No results found for this or any previous visit. ? ?No results found for this or any  previous visit. ? ?No results found for this or any previous visit. ? ?No results found for this or any previous visit. ? ?Results for orders placed during the hospital encounter of 07/07/19 ? ?US RENAL ? ?Narrative ?CLINICAL

## 2021-09-09 NOTE — Patient Instructions (Signed)

## 2021-09-09 NOTE — Progress Notes (Signed)
post void residual=355 ml ?

## 2021-09-10 DIAGNOSIS — D631 Anemia in chronic kidney disease: Secondary | ICD-10-CM | POA: Diagnosis not present

## 2021-09-10 DIAGNOSIS — D509 Iron deficiency anemia, unspecified: Secondary | ICD-10-CM | POA: Diagnosis not present

## 2021-09-10 DIAGNOSIS — R2681 Unsteadiness on feet: Secondary | ICD-10-CM | POA: Diagnosis not present

## 2021-09-10 DIAGNOSIS — N186 End stage renal disease: Secondary | ICD-10-CM | POA: Diagnosis not present

## 2021-09-10 DIAGNOSIS — Z23 Encounter for immunization: Secondary | ICD-10-CM | POA: Diagnosis not present

## 2021-09-10 DIAGNOSIS — I480 Paroxysmal atrial fibrillation: Secondary | ICD-10-CM | POA: Diagnosis not present

## 2021-09-10 DIAGNOSIS — Z992 Dependence on renal dialysis: Secondary | ICD-10-CM | POA: Diagnosis not present

## 2021-09-10 DIAGNOSIS — M6281 Muscle weakness (generalized): Secondary | ICD-10-CM | POA: Diagnosis not present

## 2021-09-12 DIAGNOSIS — M6281 Muscle weakness (generalized): Secondary | ICD-10-CM | POA: Diagnosis not present

## 2021-09-12 DIAGNOSIS — R2681 Unsteadiness on feet: Secondary | ICD-10-CM | POA: Diagnosis not present

## 2021-09-12 DIAGNOSIS — I480 Paroxysmal atrial fibrillation: Secondary | ICD-10-CM | POA: Diagnosis not present

## 2021-09-13 DIAGNOSIS — M6281 Muscle weakness (generalized): Secondary | ICD-10-CM | POA: Diagnosis not present

## 2021-09-13 DIAGNOSIS — L89153 Pressure ulcer of sacral region, stage 3: Secondary | ICD-10-CM | POA: Diagnosis not present

## 2021-09-13 DIAGNOSIS — Z992 Dependence on renal dialysis: Secondary | ICD-10-CM | POA: Diagnosis not present

## 2021-09-13 DIAGNOSIS — L98492 Non-pressure chronic ulcer of skin of other sites with fat layer exposed: Secondary | ICD-10-CM | POA: Diagnosis not present

## 2021-09-13 DIAGNOSIS — Z23 Encounter for immunization: Secondary | ICD-10-CM | POA: Diagnosis not present

## 2021-09-13 DIAGNOSIS — R2681 Unsteadiness on feet: Secondary | ICD-10-CM | POA: Diagnosis not present

## 2021-09-13 DIAGNOSIS — N186 End stage renal disease: Secondary | ICD-10-CM | POA: Diagnosis not present

## 2021-09-13 DIAGNOSIS — D509 Iron deficiency anemia, unspecified: Secondary | ICD-10-CM | POA: Diagnosis not present

## 2021-09-13 DIAGNOSIS — D631 Anemia in chronic kidney disease: Secondary | ICD-10-CM | POA: Diagnosis not present

## 2021-09-13 DIAGNOSIS — L89513 Pressure ulcer of right ankle, stage 3: Secondary | ICD-10-CM | POA: Diagnosis not present

## 2021-09-13 DIAGNOSIS — I480 Paroxysmal atrial fibrillation: Secondary | ICD-10-CM | POA: Diagnosis not present

## 2021-09-13 DIAGNOSIS — L89523 Pressure ulcer of left ankle, stage 3: Secondary | ICD-10-CM | POA: Diagnosis not present

## 2021-09-14 DIAGNOSIS — L89520 Pressure ulcer of left ankle, unstageable: Secondary | ICD-10-CM | POA: Diagnosis not present

## 2021-09-14 DIAGNOSIS — L89513 Pressure ulcer of right ankle, stage 3: Secondary | ICD-10-CM | POA: Diagnosis not present

## 2021-09-14 DIAGNOSIS — I480 Paroxysmal atrial fibrillation: Secondary | ICD-10-CM | POA: Diagnosis not present

## 2021-09-14 DIAGNOSIS — M6281 Muscle weakness (generalized): Secondary | ICD-10-CM | POA: Diagnosis not present

## 2021-09-14 DIAGNOSIS — E669 Obesity, unspecified: Secondary | ICD-10-CM | POA: Diagnosis not present

## 2021-09-14 DIAGNOSIS — R2681 Unsteadiness on feet: Secondary | ICD-10-CM | POA: Diagnosis not present

## 2021-09-14 DIAGNOSIS — E11622 Type 2 diabetes mellitus with other skin ulcer: Secondary | ICD-10-CM | POA: Diagnosis not present

## 2021-09-15 DIAGNOSIS — Z992 Dependence on renal dialysis: Secondary | ICD-10-CM | POA: Diagnosis not present

## 2021-09-15 DIAGNOSIS — D509 Iron deficiency anemia, unspecified: Secondary | ICD-10-CM | POA: Diagnosis not present

## 2021-09-15 DIAGNOSIS — Z23 Encounter for immunization: Secondary | ICD-10-CM | POA: Diagnosis not present

## 2021-09-15 DIAGNOSIS — N186 End stage renal disease: Secondary | ICD-10-CM | POA: Diagnosis not present

## 2021-09-15 DIAGNOSIS — D631 Anemia in chronic kidney disease: Secondary | ICD-10-CM | POA: Diagnosis not present

## 2021-09-16 DIAGNOSIS — I480 Paroxysmal atrial fibrillation: Secondary | ICD-10-CM | POA: Diagnosis not present

## 2021-09-16 DIAGNOSIS — M6281 Muscle weakness (generalized): Secondary | ICD-10-CM | POA: Diagnosis not present

## 2021-09-16 DIAGNOSIS — R2681 Unsteadiness on feet: Secondary | ICD-10-CM | POA: Diagnosis not present

## 2021-09-17 DIAGNOSIS — R2681 Unsteadiness on feet: Secondary | ICD-10-CM | POA: Diagnosis not present

## 2021-09-17 DIAGNOSIS — N186 End stage renal disease: Secondary | ICD-10-CM | POA: Diagnosis not present

## 2021-09-17 DIAGNOSIS — D631 Anemia in chronic kidney disease: Secondary | ICD-10-CM | POA: Diagnosis not present

## 2021-09-17 DIAGNOSIS — M6281 Muscle weakness (generalized): Secondary | ICD-10-CM | POA: Diagnosis not present

## 2021-09-17 DIAGNOSIS — I480 Paroxysmal atrial fibrillation: Secondary | ICD-10-CM | POA: Diagnosis not present

## 2021-09-17 DIAGNOSIS — D509 Iron deficiency anemia, unspecified: Secondary | ICD-10-CM | POA: Diagnosis not present

## 2021-09-17 DIAGNOSIS — Z23 Encounter for immunization: Secondary | ICD-10-CM | POA: Diagnosis not present

## 2021-09-17 DIAGNOSIS — Z992 Dependence on renal dialysis: Secondary | ICD-10-CM | POA: Diagnosis not present

## 2021-09-19 DIAGNOSIS — R2681 Unsteadiness on feet: Secondary | ICD-10-CM | POA: Diagnosis not present

## 2021-09-19 DIAGNOSIS — I480 Paroxysmal atrial fibrillation: Secondary | ICD-10-CM | POA: Diagnosis not present

## 2021-09-19 DIAGNOSIS — M6281 Muscle weakness (generalized): Secondary | ICD-10-CM | POA: Diagnosis not present

## 2021-09-20 DIAGNOSIS — D509 Iron deficiency anemia, unspecified: Secondary | ICD-10-CM | POA: Diagnosis not present

## 2021-09-20 DIAGNOSIS — Z23 Encounter for immunization: Secondary | ICD-10-CM | POA: Diagnosis not present

## 2021-09-20 DIAGNOSIS — N186 End stage renal disease: Secondary | ICD-10-CM | POA: Diagnosis not present

## 2021-09-20 DIAGNOSIS — I259 Chronic ischemic heart disease, unspecified: Secondary | ICD-10-CM | POA: Diagnosis not present

## 2021-09-20 DIAGNOSIS — Z992 Dependence on renal dialysis: Secondary | ICD-10-CM | POA: Diagnosis not present

## 2021-09-20 DIAGNOSIS — D631 Anemia in chronic kidney disease: Secondary | ICD-10-CM | POA: Diagnosis not present

## 2021-09-21 DIAGNOSIS — I480 Paroxysmal atrial fibrillation: Secondary | ICD-10-CM | POA: Diagnosis not present

## 2021-09-21 DIAGNOSIS — L89513 Pressure ulcer of right ankle, stage 3: Secondary | ICD-10-CM | POA: Diagnosis not present

## 2021-09-21 DIAGNOSIS — R2681 Unsteadiness on feet: Secondary | ICD-10-CM | POA: Diagnosis not present

## 2021-09-21 DIAGNOSIS — M6281 Muscle weakness (generalized): Secondary | ICD-10-CM | POA: Diagnosis not present

## 2021-09-21 DIAGNOSIS — E669 Obesity, unspecified: Secondary | ICD-10-CM | POA: Diagnosis not present

## 2021-09-21 DIAGNOSIS — L89520 Pressure ulcer of left ankle, unstageable: Secondary | ICD-10-CM | POA: Diagnosis not present

## 2021-09-21 DIAGNOSIS — E11622 Type 2 diabetes mellitus with other skin ulcer: Secondary | ICD-10-CM | POA: Diagnosis not present

## 2021-09-22 DIAGNOSIS — D509 Iron deficiency anemia, unspecified: Secondary | ICD-10-CM | POA: Diagnosis not present

## 2021-09-22 DIAGNOSIS — N186 End stage renal disease: Secondary | ICD-10-CM | POA: Diagnosis not present

## 2021-09-22 DIAGNOSIS — Z992 Dependence on renal dialysis: Secondary | ICD-10-CM | POA: Diagnosis not present

## 2021-09-22 DIAGNOSIS — I480 Paroxysmal atrial fibrillation: Secondary | ICD-10-CM | POA: Diagnosis not present

## 2021-09-22 DIAGNOSIS — D631 Anemia in chronic kidney disease: Secondary | ICD-10-CM | POA: Diagnosis not present

## 2021-09-22 DIAGNOSIS — Z23 Encounter for immunization: Secondary | ICD-10-CM | POA: Diagnosis not present

## 2021-09-22 DIAGNOSIS — R2681 Unsteadiness on feet: Secondary | ICD-10-CM | POA: Diagnosis not present

## 2021-09-22 DIAGNOSIS — M6281 Muscle weakness (generalized): Secondary | ICD-10-CM | POA: Diagnosis not present

## 2021-09-23 DIAGNOSIS — R2681 Unsteadiness on feet: Secondary | ICD-10-CM | POA: Diagnosis not present

## 2021-09-23 DIAGNOSIS — Z992 Dependence on renal dialysis: Secondary | ICD-10-CM | POA: Diagnosis not present

## 2021-09-23 DIAGNOSIS — N186 End stage renal disease: Secondary | ICD-10-CM | POA: Diagnosis not present

## 2021-09-23 DIAGNOSIS — M6281 Muscle weakness (generalized): Secondary | ICD-10-CM | POA: Diagnosis not present

## 2021-09-23 DIAGNOSIS — I480 Paroxysmal atrial fibrillation: Secondary | ICD-10-CM | POA: Diagnosis not present

## 2021-09-26 ENCOUNTER — Inpatient Hospital Stay (HOSPITAL_COMMUNITY): Payer: Medicare Other | Attending: Hematology

## 2021-09-26 DIAGNOSIS — D472 Monoclonal gammopathy: Secondary | ICD-10-CM | POA: Diagnosis not present

## 2021-09-26 DIAGNOSIS — Z992 Dependence on renal dialysis: Secondary | ICD-10-CM | POA: Insufficient documentation

## 2021-09-26 DIAGNOSIS — D631 Anemia in chronic kidney disease: Secondary | ICD-10-CM

## 2021-09-26 DIAGNOSIS — E1122 Type 2 diabetes mellitus with diabetic chronic kidney disease: Secondary | ICD-10-CM | POA: Insufficient documentation

## 2021-09-26 DIAGNOSIS — M6281 Muscle weakness (generalized): Secondary | ICD-10-CM | POA: Diagnosis not present

## 2021-09-26 DIAGNOSIS — D508 Other iron deficiency anemias: Secondary | ICD-10-CM | POA: Insufficient documentation

## 2021-09-26 DIAGNOSIS — I480 Paroxysmal atrial fibrillation: Secondary | ICD-10-CM | POA: Diagnosis not present

## 2021-09-26 DIAGNOSIS — R2681 Unsteadiness on feet: Secondary | ICD-10-CM | POA: Diagnosis not present

## 2021-09-26 DIAGNOSIS — E538 Deficiency of other specified B group vitamins: Secondary | ICD-10-CM | POA: Diagnosis not present

## 2021-09-26 DIAGNOSIS — N186 End stage renal disease: Secondary | ICD-10-CM | POA: Insufficient documentation

## 2021-09-26 LAB — CBC
HCT: 32 % — ABNORMAL LOW (ref 39.0–52.0)
Hemoglobin: 9.8 g/dL — ABNORMAL LOW (ref 13.0–17.0)
MCH: 26.8 pg (ref 26.0–34.0)
MCHC: 30.6 g/dL (ref 30.0–36.0)
MCV: 87.4 fL (ref 80.0–100.0)
Platelets: 307 10*3/uL (ref 150–400)
RBC: 3.66 MIL/uL — ABNORMAL LOW (ref 4.22–5.81)
RDW: 16.8 % — ABNORMAL HIGH (ref 11.5–15.5)
WBC: 7 10*3/uL (ref 4.0–10.5)
nRBC: 0 % (ref 0.0–0.2)

## 2021-09-26 LAB — SAMPLE TO BLOOD BANK

## 2021-09-27 DIAGNOSIS — I480 Paroxysmal atrial fibrillation: Secondary | ICD-10-CM | POA: Diagnosis not present

## 2021-09-27 DIAGNOSIS — M6281 Muscle weakness (generalized): Secondary | ICD-10-CM | POA: Diagnosis not present

## 2021-09-27 DIAGNOSIS — R2681 Unsteadiness on feet: Secondary | ICD-10-CM | POA: Diagnosis not present

## 2021-09-28 DIAGNOSIS — L89513 Pressure ulcer of right ankle, stage 3: Secondary | ICD-10-CM | POA: Diagnosis not present

## 2021-09-28 DIAGNOSIS — I480 Paroxysmal atrial fibrillation: Secondary | ICD-10-CM | POA: Diagnosis not present

## 2021-09-28 DIAGNOSIS — R2681 Unsteadiness on feet: Secondary | ICD-10-CM | POA: Diagnosis not present

## 2021-09-28 DIAGNOSIS — E669 Obesity, unspecified: Secondary | ICD-10-CM | POA: Diagnosis not present

## 2021-09-28 DIAGNOSIS — E11622 Type 2 diabetes mellitus with other skin ulcer: Secondary | ICD-10-CM | POA: Diagnosis not present

## 2021-09-28 DIAGNOSIS — M6281 Muscle weakness (generalized): Secondary | ICD-10-CM | POA: Diagnosis not present

## 2021-09-28 DIAGNOSIS — L89520 Pressure ulcer of left ankle, unstageable: Secondary | ICD-10-CM | POA: Diagnosis not present

## 2021-09-29 DIAGNOSIS — I480 Paroxysmal atrial fibrillation: Secondary | ICD-10-CM | POA: Diagnosis not present

## 2021-09-29 DIAGNOSIS — R2681 Unsteadiness on feet: Secondary | ICD-10-CM | POA: Diagnosis not present

## 2021-09-29 DIAGNOSIS — M6281 Muscle weakness (generalized): Secondary | ICD-10-CM | POA: Diagnosis not present

## 2021-09-30 DIAGNOSIS — R2681 Unsteadiness on feet: Secondary | ICD-10-CM | POA: Diagnosis not present

## 2021-09-30 DIAGNOSIS — I480 Paroxysmal atrial fibrillation: Secondary | ICD-10-CM | POA: Diagnosis not present

## 2021-09-30 DIAGNOSIS — M6281 Muscle weakness (generalized): Secondary | ICD-10-CM | POA: Diagnosis not present

## 2021-10-03 DIAGNOSIS — I480 Paroxysmal atrial fibrillation: Secondary | ICD-10-CM | POA: Diagnosis not present

## 2021-10-03 DIAGNOSIS — M6281 Muscle weakness (generalized): Secondary | ICD-10-CM | POA: Diagnosis not present

## 2021-10-03 DIAGNOSIS — R2681 Unsteadiness on feet: Secondary | ICD-10-CM | POA: Diagnosis not present

## 2021-10-04 DIAGNOSIS — R2681 Unsteadiness on feet: Secondary | ICD-10-CM | POA: Diagnosis not present

## 2021-10-04 DIAGNOSIS — M6281 Muscle weakness (generalized): Secondary | ICD-10-CM | POA: Diagnosis not present

## 2021-10-04 DIAGNOSIS — I480 Paroxysmal atrial fibrillation: Secondary | ICD-10-CM | POA: Diagnosis not present

## 2021-10-05 DIAGNOSIS — L89513 Pressure ulcer of right ankle, stage 3: Secondary | ICD-10-CM | POA: Diagnosis not present

## 2021-10-05 DIAGNOSIS — L89520 Pressure ulcer of left ankle, unstageable: Secondary | ICD-10-CM | POA: Diagnosis not present

## 2021-10-05 DIAGNOSIS — E11622 Type 2 diabetes mellitus with other skin ulcer: Secondary | ICD-10-CM | POA: Diagnosis not present

## 2021-10-05 DIAGNOSIS — E1051 Type 1 diabetes mellitus with diabetic peripheral angiopathy without gangrene: Secondary | ICD-10-CM | POA: Diagnosis not present

## 2021-10-05 DIAGNOSIS — R2681 Unsteadiness on feet: Secondary | ICD-10-CM | POA: Diagnosis not present

## 2021-10-05 DIAGNOSIS — F01A2 Vascular dementia, mild, with psychotic disturbance: Secondary | ICD-10-CM | POA: Diagnosis not present

## 2021-10-05 DIAGNOSIS — L84 Corns and callosities: Secondary | ICD-10-CM | POA: Diagnosis not present

## 2021-10-05 DIAGNOSIS — E669 Obesity, unspecified: Secondary | ICD-10-CM | POA: Diagnosis not present

## 2021-10-05 DIAGNOSIS — M6281 Muscle weakness (generalized): Secondary | ICD-10-CM | POA: Diagnosis not present

## 2021-10-05 DIAGNOSIS — I480 Paroxysmal atrial fibrillation: Secondary | ICD-10-CM | POA: Diagnosis not present

## 2021-10-05 DIAGNOSIS — F4323 Adjustment disorder with mixed anxiety and depressed mood: Secondary | ICD-10-CM | POA: Diagnosis not present

## 2021-10-06 DIAGNOSIS — R2681 Unsteadiness on feet: Secondary | ICD-10-CM | POA: Diagnosis not present

## 2021-10-06 DIAGNOSIS — I480 Paroxysmal atrial fibrillation: Secondary | ICD-10-CM | POA: Diagnosis not present

## 2021-10-06 DIAGNOSIS — M6281 Muscle weakness (generalized): Secondary | ICD-10-CM | POA: Diagnosis not present

## 2021-10-07 DIAGNOSIS — M6281 Muscle weakness (generalized): Secondary | ICD-10-CM | POA: Diagnosis not present

## 2021-10-07 DIAGNOSIS — R2681 Unsteadiness on feet: Secondary | ICD-10-CM | POA: Diagnosis not present

## 2021-10-07 DIAGNOSIS — I480 Paroxysmal atrial fibrillation: Secondary | ICD-10-CM | POA: Diagnosis not present

## 2021-10-08 DIAGNOSIS — R2681 Unsteadiness on feet: Secondary | ICD-10-CM | POA: Diagnosis not present

## 2021-10-08 DIAGNOSIS — M6281 Muscle weakness (generalized): Secondary | ICD-10-CM | POA: Diagnosis not present

## 2021-10-08 DIAGNOSIS — I480 Paroxysmal atrial fibrillation: Secondary | ICD-10-CM | POA: Diagnosis not present

## 2021-10-11 DIAGNOSIS — L98492 Non-pressure chronic ulcer of skin of other sites with fat layer exposed: Secondary | ICD-10-CM | POA: Diagnosis not present

## 2021-10-11 DIAGNOSIS — L89513 Pressure ulcer of right ankle, stage 3: Secondary | ICD-10-CM | POA: Diagnosis not present

## 2021-10-11 DIAGNOSIS — L89523 Pressure ulcer of left ankle, stage 3: Secondary | ICD-10-CM | POA: Diagnosis not present

## 2021-10-11 DIAGNOSIS — L89153 Pressure ulcer of sacral region, stage 3: Secondary | ICD-10-CM | POA: Diagnosis not present

## 2021-10-12 ENCOUNTER — Inpatient Hospital Stay (HOSPITAL_COMMUNITY): Payer: Medicare Other

## 2021-10-12 ENCOUNTER — Emergency Department (HOSPITAL_COMMUNITY): Payer: Medicare Other

## 2021-10-12 ENCOUNTER — Encounter (HOSPITAL_COMMUNITY): Payer: Self-pay

## 2021-10-12 ENCOUNTER — Inpatient Hospital Stay (HOSPITAL_COMMUNITY)
Admission: EM | Admit: 2021-10-12 | Discharge: 2021-10-17 | DRG: 308 | Disposition: A | Discharge status: Skilled Nursing Facility | Payer: Medicare Other | Source: Skilled Nursing Facility | Attending: Internal Medicine | Admitting: Internal Medicine

## 2021-10-12 ENCOUNTER — Other Ambulatory Visit: Payer: Self-pay

## 2021-10-12 DIAGNOSIS — E1169 Type 2 diabetes mellitus with other specified complication: Secondary | ICD-10-CM | POA: Diagnosis present

## 2021-10-12 DIAGNOSIS — E1165 Type 2 diabetes mellitus with hyperglycemia: Secondary | ICD-10-CM | POA: Diagnosis present

## 2021-10-12 DIAGNOSIS — I5032 Chronic diastolic (congestive) heart failure: Secondary | ICD-10-CM | POA: Diagnosis not present

## 2021-10-12 DIAGNOSIS — I5030 Unspecified diastolic (congestive) heart failure: Secondary | ICD-10-CM | POA: Diagnosis not present

## 2021-10-12 DIAGNOSIS — E11621 Type 2 diabetes mellitus with foot ulcer: Secondary | ICD-10-CM

## 2021-10-12 DIAGNOSIS — E1122 Type 2 diabetes mellitus with diabetic chronic kidney disease: Secondary | ICD-10-CM | POA: Diagnosis present

## 2021-10-12 DIAGNOSIS — I132 Hypertensive heart and chronic kidney disease with heart failure and with stage 5 chronic kidney disease, or end stage renal disease: Secondary | ICD-10-CM | POA: Diagnosis present

## 2021-10-12 DIAGNOSIS — L97319 Non-pressure chronic ulcer of right ankle with unspecified severity: Secondary | ICD-10-CM | POA: Diagnosis present

## 2021-10-12 DIAGNOSIS — L8990 Pressure ulcer of unspecified site, unspecified stage: Secondary | ICD-10-CM | POA: Diagnosis present

## 2021-10-12 DIAGNOSIS — K760 Fatty (change of) liver, not elsewhere classified: Secondary | ICD-10-CM | POA: Diagnosis not present

## 2021-10-12 DIAGNOSIS — E669 Obesity, unspecified: Secondary | ICD-10-CM | POA: Diagnosis not present

## 2021-10-12 DIAGNOSIS — Z8249 Family history of ischemic heart disease and other diseases of the circulatory system: Secondary | ICD-10-CM

## 2021-10-12 DIAGNOSIS — I4892 Unspecified atrial flutter: Secondary | ICD-10-CM | POA: Diagnosis present

## 2021-10-12 DIAGNOSIS — M19072 Primary osteoarthritis, left ankle and foot: Secondary | ICD-10-CM | POA: Diagnosis not present

## 2021-10-12 DIAGNOSIS — Z833 Family history of diabetes mellitus: Secondary | ICD-10-CM

## 2021-10-12 DIAGNOSIS — M109 Gout, unspecified: Secondary | ICD-10-CM | POA: Diagnosis present

## 2021-10-12 DIAGNOSIS — I120 Hypertensive chronic kidney disease with stage 5 chronic kidney disease or end stage renal disease: Secondary | ICD-10-CM | POA: Diagnosis not present

## 2021-10-12 DIAGNOSIS — Z794 Long term (current) use of insulin: Secondary | ICD-10-CM

## 2021-10-12 DIAGNOSIS — E785 Hyperlipidemia, unspecified: Secondary | ICD-10-CM | POA: Diagnosis not present

## 2021-10-12 DIAGNOSIS — N2581 Secondary hyperparathyroidism of renal origin: Secondary | ICD-10-CM | POA: Diagnosis not present

## 2021-10-12 DIAGNOSIS — E119 Type 2 diabetes mellitus without complications: Secondary | ICD-10-CM

## 2021-10-12 DIAGNOSIS — M25562 Pain in left knee: Secondary | ICD-10-CM | POA: Diagnosis not present

## 2021-10-12 DIAGNOSIS — I517 Cardiomegaly: Secondary | ICD-10-CM | POA: Diagnosis not present

## 2021-10-12 DIAGNOSIS — Z20822 Contact with and (suspected) exposure to covid-19: Secondary | ICD-10-CM | POA: Diagnosis not present

## 2021-10-12 DIAGNOSIS — Z881 Allergy status to other antibiotic agents status: Secondary | ICD-10-CM

## 2021-10-12 DIAGNOSIS — I953 Hypotension of hemodialysis: Secondary | ICD-10-CM | POA: Diagnosis not present

## 2021-10-12 DIAGNOSIS — Z992 Dependence on renal dialysis: Secondary | ICD-10-CM | POA: Diagnosis not present

## 2021-10-12 DIAGNOSIS — I10 Essential (primary) hypertension: Secondary | ICD-10-CM

## 2021-10-12 DIAGNOSIS — L97329 Non-pressure chronic ulcer of left ankle with unspecified severity: Secondary | ICD-10-CM | POA: Diagnosis present

## 2021-10-12 DIAGNOSIS — N200 Calculus of kidney: Secondary | ICD-10-CM | POA: Diagnosis not present

## 2021-10-12 DIAGNOSIS — S93491A Sprain of other ligament of right ankle, initial encounter: Secondary | ICD-10-CM | POA: Diagnosis not present

## 2021-10-12 DIAGNOSIS — Z888 Allergy status to other drugs, medicaments and biological substances status: Secondary | ICD-10-CM

## 2021-10-12 DIAGNOSIS — M898X9 Other specified disorders of bone, unspecified site: Secondary | ICD-10-CM | POA: Diagnosis present

## 2021-10-12 DIAGNOSIS — Z6832 Body mass index (BMI) 32.0-32.9, adult: Secondary | ICD-10-CM

## 2021-10-12 DIAGNOSIS — M7989 Other specified soft tissue disorders: Secondary | ICD-10-CM | POA: Diagnosis not present

## 2021-10-12 DIAGNOSIS — M868X7 Other osteomyelitis, ankle and foot: Secondary | ICD-10-CM | POA: Diagnosis not present

## 2021-10-12 DIAGNOSIS — L97909 Non-pressure chronic ulcer of unspecified part of unspecified lower leg with unspecified severity: Secondary | ICD-10-CM | POA: Diagnosis not present

## 2021-10-12 DIAGNOSIS — S93492A Sprain of other ligament of left ankle, initial encounter: Secondary | ICD-10-CM | POA: Diagnosis not present

## 2021-10-12 DIAGNOSIS — Z87891 Personal history of nicotine dependence: Secondary | ICD-10-CM

## 2021-10-12 DIAGNOSIS — N186 End stage renal disease: Secondary | ICD-10-CM

## 2021-10-12 DIAGNOSIS — R531 Weakness: Secondary | ICD-10-CM

## 2021-10-12 DIAGNOSIS — Z91048 Other nonmedicinal substance allergy status: Secondary | ICD-10-CM

## 2021-10-12 DIAGNOSIS — R000 Tachycardia, unspecified: Secondary | ICD-10-CM | POA: Diagnosis not present

## 2021-10-12 DIAGNOSIS — L89514 Pressure ulcer of right ankle, stage 4: Secondary | ICD-10-CM

## 2021-10-12 DIAGNOSIS — Z79899 Other long term (current) drug therapy: Secondary | ICD-10-CM

## 2021-10-12 DIAGNOSIS — Z7901 Long term (current) use of anticoagulants: Secondary | ICD-10-CM | POA: Diagnosis not present

## 2021-10-12 DIAGNOSIS — I4891 Unspecified atrial fibrillation: Principal | ICD-10-CM | POA: Diagnosis present

## 2021-10-12 DIAGNOSIS — Z9079 Acquired absence of other genital organ(s): Secondary | ICD-10-CM

## 2021-10-12 DIAGNOSIS — Z9049 Acquired absence of other specified parts of digestive tract: Secondary | ICD-10-CM

## 2021-10-12 DIAGNOSIS — D631 Anemia in chronic kidney disease: Secondary | ICD-10-CM | POA: Diagnosis present

## 2021-10-12 DIAGNOSIS — Z743 Need for continuous supervision: Secondary | ICD-10-CM | POA: Diagnosis not present

## 2021-10-12 DIAGNOSIS — L98499 Non-pressure chronic ulcer of skin of other sites with unspecified severity: Secondary | ICD-10-CM | POA: Diagnosis not present

## 2021-10-12 DIAGNOSIS — E11622 Type 2 diabetes mellitus with other skin ulcer: Secondary | ICD-10-CM

## 2021-10-12 DIAGNOSIS — E1129 Type 2 diabetes mellitus with other diabetic kidney complication: Secondary | ICD-10-CM | POA: Diagnosis not present

## 2021-10-12 DIAGNOSIS — Z872 Personal history of diseases of the skin and subcutaneous tissue: Secondary | ICD-10-CM | POA: Diagnosis not present

## 2021-10-12 LAB — CBC WITH DIFFERENTIAL/PLATELET
Abs Immature Granulocytes: 0.04 10*3/uL (ref 0.00–0.07)
Basophils Absolute: 0 10*3/uL (ref 0.0–0.1)
Basophils Relative: 0 %
Eosinophils Absolute: 0.1 10*3/uL (ref 0.0–0.5)
Eosinophils Relative: 2 %
HCT: 30.5 % — ABNORMAL LOW (ref 39.0–52.0)
Hemoglobin: 10 g/dL — ABNORMAL LOW (ref 13.0–17.0)
Immature Granulocytes: 1 %
Lymphocytes Relative: 11 %
Lymphs Abs: 0.9 10*3/uL (ref 0.7–4.0)
MCH: 27.5 pg (ref 26.0–34.0)
MCHC: 32.8 g/dL (ref 30.0–36.0)
MCV: 84 fL (ref 80.0–100.0)
Monocytes Absolute: 0.6 10*3/uL (ref 0.1–1.0)
Monocytes Relative: 8 %
Neutro Abs: 6.2 10*3/uL (ref 1.7–7.7)
Neutrophils Relative %: 78 %
Platelets: 315 10*3/uL (ref 150–400)
RBC: 3.63 MIL/uL — ABNORMAL LOW (ref 4.22–5.81)
RDW: 17.2 % — ABNORMAL HIGH (ref 11.5–15.5)
WBC: 7.9 10*3/uL (ref 4.0–10.5)
nRBC: 0.3 % — ABNORMAL HIGH (ref 0.0–0.2)

## 2021-10-12 LAB — COMPREHENSIVE METABOLIC PANEL
ALT: 28 U/L (ref 0–44)
AST: 35 U/L (ref 15–41)
Albumin: 2.2 g/dL — ABNORMAL LOW (ref 3.5–5.0)
Alkaline Phosphatase: 211 U/L — ABNORMAL HIGH (ref 38–126)
Anion gap: 11 (ref 5–15)
BUN: 58 mg/dL — ABNORMAL HIGH (ref 8–23)
CO2: 27 mmol/L (ref 22–32)
Calcium: 8.1 mg/dL — ABNORMAL LOW (ref 8.9–10.3)
Chloride: 97 mmol/L — ABNORMAL LOW (ref 98–111)
Creatinine, Ser: 4.74 mg/dL — ABNORMAL HIGH (ref 0.61–1.24)
GFR, Estimated: 13 mL/min — ABNORMAL LOW (ref >60)
Glucose, Bld: 208 mg/dL — ABNORMAL HIGH (ref 70–99)
Potassium: 4.2 mmol/L (ref 3.5–5.1)
Sodium: 135 mmol/L (ref 135–145)
Total Bilirubin: 1.2 mg/dL (ref 0.3–1.2)
Total Protein: 6.9 g/dL (ref 6.5–8.1)

## 2021-10-12 LAB — RESP PANEL BY RT-PCR (FLU A&B, COVID) ARPGX2
Influenza A by PCR: NEGATIVE
Influenza B by PCR: NEGATIVE
SARS Coronavirus 2 by RT PCR: NEGATIVE

## 2021-10-12 LAB — GLUCOSE, CAPILLARY: Glucose-Capillary: 198 mg/dL — ABNORMAL HIGH (ref 70–99)

## 2021-10-12 LAB — LACTIC ACID, PLASMA: Lactic Acid, Venous: 1.7 mmol/L (ref 0.5–1.9)

## 2021-10-12 LAB — PROTIME-INR
INR: 2.1 — ABNORMAL HIGH (ref 0.8–1.2)
Prothrombin Time: 23.3 seconds — ABNORMAL HIGH (ref 11.4–15.2)

## 2021-10-12 LAB — APTT: aPTT: 42 seconds — ABNORMAL HIGH (ref 24–36)

## 2021-10-12 MED ORDER — LACTATED RINGERS IV SOLN: INTRAVENOUS | Status: DC

## 2021-10-12 MED ORDER — INSULIN ASPART 100 UNIT/ML IJ SOLN
0.0000 [IU] | Freq: Three times a day (TID) | INTRAMUSCULAR | Status: DC
  Administered 2021-10-13: 2 [IU] via SUBCUTANEOUS
  Administered 2021-10-13 – 2021-10-14 (×3): 1 [IU] via SUBCUTANEOUS
  Administered 2021-10-14 (×2): 2 [IU] via SUBCUTANEOUS
  Administered 2021-10-15: 1 [IU] via SUBCUTANEOUS
  Administered 2021-10-15 – 2021-10-17 (×5): 2 [IU] via SUBCUTANEOUS
  Administered 2021-10-17: 1 [IU] via SUBCUTANEOUS

## 2021-10-12 MED ORDER — DILTIAZEM HCL-DEXTROSE 125-5 MG/125ML-% IV SOLN (PREMIX)
5.0000 mg/h | INTRAVENOUS | Status: DC
  Administered 2021-10-12: 10 mg/h via INTRAVENOUS
  Administered 2021-10-12: 15 mg/h via INTRAVENOUS
  Administered 2021-10-13 – 2021-10-15 (×2): 5 mg/h via INTRAVENOUS
  Filled 2021-10-12 (×3): qty 125

## 2021-10-12 MED ORDER — SODIUM CHLORIDE 0.9 % IV SOLN: 1.0000 g | INTRAVENOUS | Status: DC

## 2021-10-12 MED ORDER — VANCOMYCIN HCL 2000 MG/400ML IV SOLN
2000.0000 mg | Freq: Once | INTRAVENOUS | Status: AC
  Administered 2021-10-12: 2000 mg via INTRAVENOUS
  Filled 2021-10-12: qty 400

## 2021-10-12 MED ORDER — SODIUM CHLORIDE 0.9 % IV BOLUS (SEPSIS)
1000.0000 mL | Freq: Once | INTRAVENOUS | Status: AC
  Administered 2021-10-12: 1000 mL via INTRAVENOUS

## 2021-10-12 MED ORDER — METRONIDAZOLE 500 MG/100ML IV SOLN
500.0000 mg | Freq: Once | INTRAVENOUS | Status: AC
  Administered 2021-10-12: 500 mg via INTRAVENOUS
  Filled 2021-10-12: qty 100

## 2021-10-12 MED ORDER — INSULIN ASPART 100 UNIT/ML IJ SOLN
0.0000 [IU] | Freq: Every day | INTRAMUSCULAR | Status: DC
  Administered 2021-10-17: 2 [IU] via SUBCUTANEOUS

## 2021-10-12 MED ORDER — VANCOMYCIN HCL IN DEXTROSE 1-5 GM/200ML-% IV SOLN: 1000.0000 mg | INTRAVENOUS | Status: DC

## 2021-10-12 MED ORDER — ONDANSETRON HCL 4 MG PO TABS: 4.0000 mg | ORAL_TABLET | Freq: Four times a day (QID) | ORAL | Status: DC | PRN

## 2021-10-12 MED ORDER — OXYCODONE HCL 5 MG PO TABS
5.0000 mg | ORAL_TABLET | Freq: Once | ORAL | Status: AC
  Administered 2021-10-12: 5 mg via ORAL
  Filled 2021-10-12: qty 1

## 2021-10-12 MED ORDER — SEVELAMER CARBONATE 800 MG PO TABS
800.0000 mg | ORAL_TABLET | Freq: Two times a day (BID) | ORAL | Status: DC
  Administered 2021-10-13 – 2021-10-17 (×9): 800 mg via ORAL
  Filled 2021-10-12 (×9): qty 1

## 2021-10-12 MED ORDER — DILTIAZEM LOAD VIA INFUSION
10.0000 mg | Freq: Once | INTRAVENOUS | Status: AC
Start: 2021-10-12 — End: 2021-10-12
  Administered 2021-10-12: 10 mg via INTRAVENOUS
  Filled 2021-10-12: qty 10

## 2021-10-12 MED ORDER — METOPROLOL SUCCINATE ER 50 MG PO TB24
50.0000 mg | ORAL_TABLET | Freq: Two times a day (BID) | ORAL | Status: DC
  Administered 2021-10-12 – 2021-10-17 (×9): 50 mg via ORAL
  Filled 2021-10-12 (×10): qty 1

## 2021-10-12 MED ORDER — POLYETHYLENE GLYCOL 3350 17 G PO PACK
17.0000 g | PACK | Freq: Every day | ORAL | Status: DC | PRN
  Administered 2021-10-16: 17 g via ORAL
  Filled 2021-10-12: qty 1

## 2021-10-12 MED ORDER — ACETAMINOPHEN 325 MG PO TABS
650.0000 mg | ORAL_TABLET | Freq: Four times a day (QID) | ORAL | Status: DC | PRN
  Administered 2021-10-15: 650 mg via ORAL
  Filled 2021-10-12: qty 2

## 2021-10-12 MED ORDER — SODIUM CHLORIDE 0.9 % IV SOLN
2.0000 g | Freq: Once | INTRAVENOUS | Status: AC
  Administered 2021-10-12: 2 g via INTRAVENOUS
  Filled 2021-10-12: qty 12.5

## 2021-10-12 MED ORDER — APIXABAN 5 MG PO TABS
5.0000 mg | ORAL_TABLET | Freq: Two times a day (BID) | ORAL | Status: DC
  Administered 2021-10-12 – 2021-10-17 (×11): 5 mg via ORAL
  Filled 2021-10-12 (×11): qty 1

## 2021-10-12 MED ORDER — MIDODRINE HCL 5 MG PO TABS
10.0000 mg | ORAL_TABLET | ORAL | Status: DC
  Administered 2021-10-13 – 2021-10-15 (×2): 10 mg via ORAL
  Filled 2021-10-12 (×2): qty 2

## 2021-10-12 MED ORDER — VANCOMYCIN HCL IN DEXTROSE 1-5 GM/200ML-% IV SOLN: 1000.0000 mg | Freq: Once | INTRAVENOUS | Status: DC

## 2021-10-12 MED ORDER — ONDANSETRON HCL 4 MG/2ML IJ SOLN: 4.0000 mg | Freq: Four times a day (QID) | INTRAMUSCULAR | Status: DC | PRN

## 2021-10-12 MED ORDER — TAMSULOSIN HCL 0.4 MG PO CAPS
0.4000 mg | ORAL_CAPSULE | Freq: Every day | ORAL | Status: DC
  Administered 2021-10-13 – 2021-10-16 (×4): 0.4 mg via ORAL
  Filled 2021-10-12 (×4): qty 1

## 2021-10-12 MED ORDER — IOHEXOL 300 MG/ML  SOLN
100.0000 mL | Freq: Once | INTRAMUSCULAR | Status: AC | PRN
  Administered 2021-10-12: 100 mL via INTRAVENOUS

## 2021-10-12 MED ORDER — OXYCODONE HCL 5 MG PO TABS
5.0000 mg | ORAL_TABLET | Freq: Three times a day (TID) | ORAL | Status: DC | PRN
  Administered 2021-10-12 – 2021-10-13 (×2): 5 mg via ORAL
  Filled 2021-10-12 (×2): qty 1

## 2021-10-12 MED ORDER — SODIUM CHLORIDE 0.9 % IV SOLN
2.0000 g | INTRAVENOUS | Status: DC
  Administered 2021-10-13 – 2021-10-14 (×2): 2 g via INTRAVENOUS
  Filled 2021-10-12 (×2): qty 20

## 2021-10-12 MED ORDER — LEVOFLOXACIN IN D5W 750 MG/150ML IV SOLN
750.0000 mg | Freq: Once | INTRAVENOUS | Status: AC
  Administered 2021-10-12: 750 mg via INTRAVENOUS
  Filled 2021-10-12: qty 150

## 2021-10-12 MED ORDER — ACETAMINOPHEN 650 MG RE SUPP: 650.0000 mg | Freq: Four times a day (QID) | RECTAL | Status: DC | PRN

## 2021-10-12 NOTE — ED Notes (Signed)
Patient transported to CT 

## 2021-10-12 NOTE — Assessment & Plan Note (Addendum)
Bilateral lateral malleoli decubitus ulcers, with purulent drainage, both appear infected, likely some surrounding cellulitis, difficult to tell due to neuropathy, and chronic stasis skin changes.  He is tachycardic, but afebrile without leukocytosis, no lactic acidosis.  He presented with generalized weakness. ?-Initial concern for sepsis in ED, was started on broad-spectrum antibiotics, will continue IV vancomycin and ceftriaxone ?-Follow-up blood cultures ?-X-ray of bilateral feet ?  - Left-mild diffuse soft tissue swelling ?  - Right-soft tissue swelling around the ankle ?-Obtain ESR CRP, may need further imaging ?-Wound care consult ?-Left knee x-ray for left knee pain ( Knee replacements 2008) ? ?

## 2021-10-12 NOTE — H&P (Signed)
?History and Physical  ? ? ?Danny [name] DOB: June 11, 1956 DOA: 10/12/2021 ? ?PCP: Celene Squibb, MD  ? ?Patient coming from: [facility] ? ?I have personally briefly reviewed patient's old medical records in [place] ? ?Chief Complaint: Weakness, left knee pain ? ?HPI: Danny [name] is a 66 y.o. male with medical history significant for hypertension, diabetes mellitus, ESRD, atrial fibrillation. ?Patient was brought to the ED reports of left knee pain and increasing weakness over the past 2 days.  His left knee was replaced in 2008, he has not had any issues with his knee until a week ago when he started having pain.  He has ulcers to his bilateral lower extremities but has no feeling and his denies pain to the lower part of his legs.  ?Reports 1 episode of vomiting about a week ago and feeling drained so he did not go for his dialysis the next day which was Thursday.  But subsequently he reports he has not missed his HD, last HD was yesterday Tuesday.  Schedule Tuesday Thursday Saturday. ? ?ED Course: Temp 98.3.  Tachycardic heart rate initially up to 162, started on Cardizem drip.  Improvement in heart rates to low 100s.  Respiratory rate 15-20.  Blood pressure systolic 858-850. ?WBC 7.9.  Lactic acid 1.7.  Portable chest x-ray without acute abnormality.   ?CT abdomen-small bilateral pleural effusions with interval increase, small ascites.  Edema in subcutaneous plane suggesting possible anasarca.  Liver cirrhosis also suggested.  And bilateral renal stones present.  Stable left adrenal nodule. ?There was an initial concern for sepsis, so patient was started on broad-spectrum antibiotics IV vancomycin cefepime, Levaquin and metronidazole. ?1 L bolus given, ?Hospitalist to admit for atrial fibrillation with RVR. ? ?Review of Systems: As per HPI all other systems reviewed and negative. ? ?Past Medical History:  ?Diagnosis Date  ? Anemia   ? Ankle ulcer ([place])   ? Arthritis   ? CKD (chronic  kidney disease), stage V ([place])   ? Diabetes mellitus without complication ([place])   ? Diastolic congestive heart failure ([place])   ? Foot ulcer ([place])   ? Gout   ? Hypertension   ? Urinary retention   ? ? ?Past Surgical History:  ?Procedure Laterality Date  ? ANKLE SURGERY Right   ? AV FISTULA PLACEMENT Left 04/26/2021  ? Procedure: LEFT ARM ARTERIOVENOUS (AV) FISTULA CREATION (BRACHIOCEPHALIC);  Surgeon: Rosetta Posner, MD;  Location: [facility];  Service: Vascular;  Laterality: Left;  ? CHOLECYSTECTOMY    ? FISTULA SUPERFICIALIZATION Left 07/12/2021  ? Procedure: LEFT ARM ARTERIOVENOUS FISTULA SUPERFICIALIZATION;  Surgeon: Rosetta Posner, MD;  Location: [facility];  Service: Vascular;  Laterality: Left;  ? FOOT SURGERY Right   ? INSERTION OF DIALYSIS CATHETER Right 01/14/2021  ? Procedure: INSERTION OF TUNNELED DIALYSIS CATHETER RIGHT INTERNAL JUGULAR;  Surgeon: Virl Cagey, MD;  Location: [facility];  Service: General;  Laterality: Right;  ? REMOVAL OF A DIALYSIS CATHETER N/A 09/06/2021  ? Procedure: MINOR REMOVAL OF TUNNELED DIALYSIS CATHETER;  Surgeon: Rosetta Posner, MD;  Location: [facility];  Service: Vascular;  Laterality: N/A;  ? TRANSURETHRAL RESECTION OF PROSTATE N/A 01/15/2020  ? Procedure: TRANSURETHRAL RESECTION OF THE PROSTATE (TURP)  with General anesthesia and spinal;  Surgeon: Cleon Gustin, MD;  Location: [facility];  Service: Urology;  Laterality: N/A;  ? ? ? reports that he quit smoking about 34 years ago. His smoking use included cigarettes. He has a  15.00 pack-year smoking history. He has never used smokeless tobacco. He reports that he does not currently use alcohol. He reports that he does not use drugs. ? ?Allergies  ?Allergen Reactions  ? Dust Mite Extract Itching and Other (See Comments)  ?  Unknown reaction-potential shortness of breath  ? Prednisone Nausea And Vomiting  ? Rocephin [Ceftriaxone] Nausea And Vomiting  ? ? ?Family History  ?Problem Relation Age of Onset  ? Diabetes Mother   ? Heart attack  Mother   ? Heart attack Father   ? Diabetes Brother   ? ? ?Prior to Admission medications   ?Medication Sig Start Date End Date Taking? Authorizing Provider  ?acetaminophen (TYLENOL) 500 MG tablet Take 1,000 mg by mouth every 6 (six) hours as needed for moderate pain or headache.    [provider]  ?apixaban (ELIQUIS) 5 MG TABS tablet Take 1 tablet (5 mg total) by mouth 2 (two) times daily. 01/27/21   Kathie Dike, MD  ?cholecalciferol (VITAMIN D3) 25 MCG (1000 UNIT) tablet Take 1,000 Units by mouth daily.    [provider]  ?Cyanocobalamin (B-12 COMPLIANCE INJECTION) 1000 MCG/ML KIT Inject 1,000 mcg as directed every 28 (twenty-eight) days.    [provider]  ?docusate sodium (COLACE) 100 MG capsule Take 100 mg by mouth 2 (two) times daily.    [provider]  ?ferrous sulfate 325 (65 FE) MG tablet Take 325 mg by mouth 2 (two) times daily.    [provider]  ?fluticasone (FLONASE) 50 MCG/ACT nasal spray Place 2 sprays into both nostrils daily. 06/01/21   [provider]  ?folic acid (FOLVITE) 025 MCG tablet Take 1 tablet (400 mcg total) by mouth daily. 08/29/21   [name], PA-C  ?Insulin NPH Isophane & Regular (HUMULIN 70/30 KWIKPEN [place]) Inject 5 Units into the skin 2 (two) times daily.    [provider]  ?Lactulose 20 GM/30ML SOLN Take 30 mLs (20 g total) by mouth daily. ?Patient taking differently: Take 30 mLs by mouth 2 (two) times daily. 11/11/20   [name], MD  ?metoprolol succinate (TOPROL XL) 50 MG 24 hr tablet Take 1 tablet (50 mg total) by mouth in the morning and at bedtime. Take with or immediately following a meal. 08/15/21 08/15/22  Barton Dubois, MD  ?midodrine (PROAMATINE) 10 MG tablet Take 1 tablet by mouth Every Tuesday,Thursday,and Saturday with dialysis. 08/06/21   [provider]  ?Multiple Vitamin (MULTIVITAMIN WITH MINERALS) TABS tablet Take 1 tablet by mouth daily.    [provider]   ?Nutritional Supplements (FEEDING SUPPLEMENT, NEPRO CARB STEADY,) LIQD Take 237 mLs by mouth 2 (two) times daily between meals. 11/05/20   Barton Dubois, MD  ?Omega-3 Fatty Acids (FISH OIL) 500 MG CAPS Take 500 mg by mouth 2 (two) times daily.    [provider]  ?ondansetron (ZOFRAN) 4 MG tablet Take 4 mg by mouth every 6 (six) hours as needed for nausea or vomiting.    [provider]  ?oxycodone (OXY-IR) 5 MG capsule Take 1 capsule (5 mg total) by mouth every 6 (six) hours as needed (severe pain). 08/16/21   Barton Dubois, MD  ?polyethylene glycol (MIRALAX / GLYCOLAX) 17 g packet Take 17 g by mouth daily. 01/28/21   Kathie Dike, MD  ?PROTEIN PO Take 30 mLs by mouth 3 (three) times daily.    [provider]  ?rosuvastatin (CRESTOR) 5 MG tablet Take 5 mg by mouth every evening.    [provider]  ?senna (SENOKOT) 8.6 MG tablet Take 2 tablets by mouth 2 (two) times daily.    [provider]  ?sevelamer carbonate (RENVELA) 800 MG tablet Take 1 tablet (800 mg total) by mouth 2 (two) times daily with a meal. 01/27/21   Kathie Dike, MD  ?sodium phosphate (FLEET) 7-19 GM/118ML ENEM Place 1 enema rectally daily as needed for severe constipation.    [provider]  ?tamsulosin (FLOMAX) 0.4 MG CAPS capsule Take 1 capsule (0.4 mg total) by mouth daily after supper. 09/09/21   [name], [name], MD  ?vitamin C (ASCORBIC ACID) 500 MG tablet Take 1,000 mg by mouth 2 (two) times daily.    [provider]  ?vitamin E 1000 UNIT capsule Take 1,000 Units by mouth daily.    [provider]  ?zinc sulfate 220 (50 Zn) MG capsule Take 220 mg by mouth daily.    [provider]  ? ? ?Physical Exam: ?Vitals:  ? 10/12/21 1800 10/12/21 1815 10/12/21 1830 10/12/21 1845  ?BP: 115/82 115/82    ?Pulse: (!) 112 (!) 114 73 (!) 123  ?Resp: _0 (!) 22  ?Temp:      ?TempSrc:      ?SpO2: 94% 97% 97% 100%  ?Weight:      ?Height:      ? ? ?Constitutional: NAD,  calm, comfortable ?Vitals:  ? 10/12/21 1800 10/12/21 1815 10/12/21 1830 10/12/21 1845  ?BP: 115/82 115/82    ?Pulse: (!) 112 (!) 114 73 (!) 123  ?Resp: _1 (!) 22  ?Temp:      ?TempSrc:      ?SpO2: 94% 97% 97

## 2021-10-12 NOTE — Assessment & Plan Note (Addendum)
-   SSI-S ?-Hold NPH 5 units twice daily ?

## 2021-10-12 NOTE — ED Notes (Signed)
Resting quietly. No complaints. Remains in A-fib on monitor rate 116-130. Cardiazem gtt increased to '15mg'$ /hr. Side rails up. Call bell in reach.  ?

## 2021-10-12 NOTE — Sepsis Progress Note (Signed)
Notified bedside nurse of need to draw lactic acid and Blood Cultures.. 

## 2021-10-12 NOTE — Progress Notes (Signed)
Pharmacy Antibiotic Note ? ?SATCHEL Danny [name] a 66 y.o. male admitted on 10/12/2021 with sepsis.  Pharmacy has been consulted for vancomycin and cefepime dosing. ? ?Plan: ?Vancomycin '1000mg'$  IV every qtuesthurssat dialysis.  Goal trough 15-20 mcg/mL. ? ?Cefepime 1gm IV every 24 hours in evening. ? ?Medical History: ?Past Medical History:  ?Diagnosis Date  ? Anemia   ? Ankle ulcer ([place])   ? Arthritis   ? CKD (chronic kidney disease), stage V ([place])   ? Diabetes mellitus without complication ([place])   ? Diastolic congestive heart failure ([place])   ? Foot ulcer ([place])   ? Gout   ? Hypertension   ? Urinary retention   ? ? ?Allergies:  ?Allergies  ?Allergen Reactions  ? Dust Mite Extract Itching and Other (See Comments)  ?  Unknown reaction-potential shortness of breath  ? Prednisone Nausea And Vomiting  ? Rocephin [Ceftriaxone] Nausea And Vomiting  ? ? ?Filed Weights  ? 10/12/21 1305  ?Weight: 113.4 kg (250 lb)  ? ? ? ?  Latest Ref Rng & Units 10/12/2021  ?  1:27 PM 09/26/2021  ? 10:16 AM 08/29/2021  ?  8:30 AM  ?CBC  ?WBC 4.0 - 10.5 K/uL 7.9   7.0   8.1    ?Hemoglobin 13.0 - 17.0 g/dL 10.0   9.8   10.5    ?Hematocrit 39.0 - 52.0 % 30.5   32.0   33.8    ?Platelets 150 - 400 K/uL 315   307   269    ? ? ? ?Estimated Creatinine Clearance: 19.7 mL/min (A) (by C-G formula based on SCr of 4.74 mg/dL (H)). ? ?Antibiotics Given (last 72 hours)   ? ? Date/Time Action Medication Dose Rate  ? 10/12/21 1329 New Bag/Given  ? levofloxacin (LEVAQUIN) IVPB 750 mg 750 mg 100 mL/hr  ? ?  ? ? ?Antimicrobials this admission: ? ?Cefepime 10/12/2021  >>  ?vancomycin 10/12/2021  >>  ?levofloxacin 10/12/2021   x 1  ? ?Microbiology results: ?10/12/2021  BCx: sent ?10/12/2021  UCx: sent ?10/12/2021  Resp Panel: negative  ?10/12/2021  MRSA PCR: sent ? ?Thank you for allowing pharmacy to be a part of this patient?s care. ? ?Thomasenia Sales, PharmD ?Clinical Pharmacist ? ? ? ?

## 2021-10-12 NOTE — Assessment & Plan Note (Addendum)
Last echo 10/2020, EF 55 to 60%.  No appreciable lower extremity swelling.  CT showing small bilateral pleural effusions with interval increase, small ascites, also suggesting possible anasarca.  ?-Per HD ?

## 2021-10-12 NOTE — ED Provider Notes (Signed)
?[facility] ?Provider Note ? ? ?CSN: [id number] ?Arrival date & time: 10/12/21  1244 ? ?  ? ?History ? ?Chief Complaint  ?Patient presents with  ? Weakness  ? ? ?Danny [name] is a 66 y.o. male. ? ? ?Weakness ? ? This patient is a 66 year old male, he is on Eliquis, he is also on insulin for his diabetes metoprolol for his blood pressure and is currently living in a skilled nursing facility here locally.  He presents to the hospital today after having increasing weakness which is generalized and has been going on for couple of days, gradually worsened and today was found to be tachycardic complaining of left knee pain and generalized weakness.  The patient has chronic pain in his knees and has had a prior knee replacement on that side.  The patient spends most of the interview talking about the constipation, has now changed over to diarrhea and having a bowel movement and generalized weakness.  He is not having any fevers or chills he is not coughing or feeling particularly short of breath and has no difficulty urinating.  Paramedics did note him to be tachycardic, he does have a history of atrial fibrillation ? ?Additional history obtained showing that the patient has recently gone on to dialysis, he missed dialysis 1 week ago but was able to get dialysis on both Saturday and Tuesday, yesterday. ? ? ?Home Medications ?Prior to Admission medications   ?Medication Sig Start Date End Date Taking? Authorizing Provider  ?acetaminophen (TYLENOL) 500 MG tablet Take 1,000 mg by mouth every 6 (six) hours as needed for moderate pain or headache.    [provider]  ?apixaban (ELIQUIS) 5 MG TABS tablet Take 1 tablet (5 mg total) by mouth 2 (two) times daily. 01/27/21   Kathie Dike, MD  ?cholecalciferol (VITAMIN D3) 25 MCG (1000 UNIT) tablet Take 1,000 Units by mouth daily.    [provider]  ?Cyanocobalamin (B-12 COMPLIANCE INJECTION) 1000 MCG/ML KIT Inject 1,000 mcg as directed every  28 (twenty-eight) days.    [provider]  ?docusate sodium (COLACE) 100 MG capsule Take 100 mg by mouth 2 (two) times daily.    [provider]  ?ferrous sulfate 325 (65 FE) MG tablet Take 325 mg by mouth 2 (two) times daily.    [provider]  ?fluticasone (FLONASE) 50 MCG/ACT nasal spray Place 2 sprays into both nostrils daily. 06/01/21   [provider]  ?folic acid (FOLVITE) 683 MCG tablet Take 1 tablet (400 mcg total) by mouth daily. 08/29/21   [name], PA-C  ?Insulin NPH Isophane & Regular (HUMULIN 70/30 KWIKPEN [place]) Inject 5 Units into the skin 2 (two) times daily.    [provider]  ?Lactulose 20 GM/30ML SOLN Take 30 mLs (20 g total) by mouth daily. ?Patient taking differently: Take 30 mLs by mouth 2 (two) times daily. 11/11/20   [name], MD  ?metoprolol succinate (TOPROL XL) 50 MG 24 hr tablet Take 1 tablet (50 mg total) by mouth in the morning and at bedtime. Take with or immediately following a meal. 08/15/21 08/15/22  Barton Dubois, MD  ?midodrine (PROAMATINE) 10 MG tablet Take 1 tablet by mouth Every Tuesday,Thursday,and Saturday with dialysis. 08/06/21   [provider]  ?Multiple Vitamin (MULTIVITAMIN WITH MINERALS) TABS tablet Take 1 tablet by mouth daily.    [provider]  ?Nutritional Supplements (FEEDING SUPPLEMENT, NEPRO CARB STEADY,) LIQD Take 237 mLs by mouth 2 (two) times daily between  meals. 11/05/20   Barton Dubois, MD  ?Omega-3 Fatty Acids (FISH OIL) 500 MG CAPS Take 500 mg by mouth 2 (two) times daily.    [provider]  ?ondansetron (ZOFRAN) 4 MG tablet Take 4 mg by mouth every 6 (six) hours as needed for nausea or vomiting.    [provider]  ?oxycodone (OXY-IR) 5 MG capsule Take 1 capsule (5 mg total) by mouth every 6 (six) hours as needed (severe pain). 08/16/21   Barton Dubois, MD  ?polyethylene glycol (MIRALAX / GLYCOLAX) 17 g packet Take 17 g by mouth daily. 01/28/21   Kathie Dike, MD  ?PROTEIN PO Take 30 mLs by mouth 3 (three) times daily.    [provider]  ?rosuvastatin (CRESTOR) 5 MG tablet Take 5 mg by mouth every evening.    [provider]  ?senna (SENOKOT) 8.6 MG tablet Take 2 tablets by mouth 2 (two) times daily.    [provider]  ?sevelamer carbonate (RENVELA) 800 MG tablet Take 1 tablet (800 mg total) by mouth 2 (two) times daily with a meal. 01/27/21   Kathie Dike, MD  ?sodium phosphate (FLEET) 7-19 GM/118ML ENEM Place 1 enema rectally daily as needed for severe constipation.    [provider]  ?tamsulosin (FLOMAX) 0.4 MG CAPS capsule Take 1 capsule (0.4 mg total) by mouth daily after supper. 09/09/21   [name], [name], MD  ?vitamin C (ASCORBIC ACID) 500 MG tablet Take 1,000 mg by mouth 2 (two) times daily.    [provider]  ?vitamin E 1000 UNIT capsule Take 1,000 Units by mouth daily.    [provider]  ?zinc sulfate 220 (50 Zn) MG capsule Take 220 mg by mouth daily.    [provider]  ?   ? ?Allergies    ?Dust mite extract, Prednisone, and Rocephin [ceftriaxone]   ? ?Review of Systems   ?Review of Systems  ?Neurological:  Positive for weakness.  ?All other systems reviewed and are negative. ? ?Physical Exam ?Updated Vital Signs ?BP (!) 125/101 (BP Location: Right Wrist)   Pulse (!) 162   Temp 98.1 ?F (36.7 ?C) (Oral)   Resp 15   Ht 1.854 m (6' 1")   Wt 113.4 kg   SpO2 100%   BMI 32.98 kg/m?  ?Physical Exam ?Vitals and nursing note reviewed.  ?Constitutional:   ?   General: He is not in acute distress. ?   Appearance: He is well-developed.  ?HENT:  ?   Head: Normocephalic and atraumatic.  ?   Mouth/Throat:  ?   Pharynx: No oropharyngeal exudate.  ?Eyes:  ?   General: No scleral icterus.    ?   Right eye: No discharge.     ?   Left eye: No discharge.  ?   Conjunctiva/sclera: Conjunctivae normal.  ?   Pupils: Pupils are equal, round, and reactive to light.  ?Neck:  ?   Thyroid: No  thyromegaly.  ?   Vascular: No JVD.  ?Cardiovascular:  ?   Rate and Rhythm: Tachycardia present. Rhythm irregular.  ?   Heart sounds: Normal heart sounds. No murmur heard. ?  No friction rub. No gallop.  ?Pulmonary:  ?   Effort: Pulmonary effort is normal. No respiratory distress.  ?   Breath sounds: Normal breath sounds. No wheezing or rales.  ?   Comments: Occasional coughing ?Abdominal:  ?   General: Bowel sounds are normal. There is no distension.  ?   Palpations:  Abdomen is soft. There is no mass.  ?   Tenderness: There is abdominal tenderness.  ?   Comments: There is tenderness present in the lower abdomen on the right side there is no masses and no peritoneal signs, no pulsating mass  ?Musculoskeletal:     ?   General: Swelling and tenderness present. Normal range of motion.  ?   Cervical back: Normal range of motion and neck supple.  ?   Right lower leg: Edema present.  ?   Left lower leg: Edema present.  ?Lymphadenopathy:  ?   Cervical: No cervical adenopathy.  ?Skin: ?   General: Skin is warm and dry.  ?   Findings: Rash present. No erythema.  ?   Comments: Bilateral lower extremity erythema at the ankles and feet, multiple open wounds  ?Neurological:  ?   Mental Status: He is alert.  ?   Coordination: Coordination normal.  ?   Comments: Awake alert and able to use both arms, he does not have any focal weakness of the upper extremities, he does not want to move either of his lower extremity secondary to weakness.  ?Psychiatric:     ?   Behavior: Behavior normal.  ? ? ?ED Results / Procedures / Treatments   ?Labs ?(all labs ordered are listed, but only abnormal results are displayed) ?Labs Reviewed  ?COMPREHENSIVE METABOLIC PANEL - Abnormal; Notable for the following components:  ?    Result Value  ? Chloride 97 (*)   ? Glucose, Bld 208 (*)   ? BUN 58 (*)   ? Creatinine, Ser 4.74 (*)   ? Calcium 8.1 (*)   ? Albumin 2.2 (*)   ? Alkaline Phosphatase 211 (*)   ? GFR, Estimated 13 (*)   ? All other components  within normal limits  ?CBC WITH DIFFERENTIAL/PLATELET - Abnormal; Notable for the following components:  ? RBC 3.63 (*)   ? Hemoglobin 10.0 (*)   ? HCT 30.5 (*)   ? RDW 17.2 (*)   ? nRBC 0.3 (*)   ? All other components

## 2021-10-12 NOTE — ED Provider Notes (Addendum)
CRITICAL CARE ?Performed by: Fredia Sorrow ?Total critical care time: 45 minutes ?Critical care time was exclusive of separately billable procedures and treating other patients. ?Critical care was necessary to treat or prevent imminent or life-threatening deterioration. ?Critical care was time spent personally by me on the following activities: development of treatment plan with patient and/or surrogate as well as nursing, discussions with consultants, evaluation of patient's response to treatment, examination of patient, obtaining history from patient or surrogate, ordering and performing treatments and interventions, ordering and review of laboratory studies, ordering and review of radiographic studies, pulse oximetry and re-evaluation of patient's condition. ? ? ?Patient's CT of the abdomen without any acute findings.  Patient to get some pain medicine for his chronic knee pain.  Does have erythema to both lower extremities but most likely due to chronic edema.  Patient is a dialysis patient.  Was dialyzed on Tuesday.  Potassium normal here today.  Patient did have abdominal pain but as stated above the CT of the abdomen without any acute finding.  Patient was already started on diltiazem drip for the atrial flutter that is being titrated.  Heart rate still in the low 100s.  Patient was initially started on sepsis protocol.  Did get broad-spectrum antibiotics.  But now that the CT is back without any acute findings unlikely.  Lactic acid was not elevated no significant leukocytosis.  Probably can stop the sepsis protocol. ? ?Patient is from a nursing facility. ? ?Patient will need admission for the atrial flutter. ?  ?Fredia Sorrow, MD ?10/12/21 1812 ? ?  ?Fredia Sorrow, MD ?10/12/21 1814 ? ?

## 2021-10-12 NOTE — ED Triage Notes (Signed)
"  Been feeling real weak and having left knee pain. I did vomit last Wednesday once" per patient ?

## 2021-10-12 NOTE — ED Notes (Signed)
Pt sleeping. 

## 2021-10-12 NOTE — Assessment & Plan Note (Signed)
Stable. ?-Resume metoprolol, currently on Cardizem drip ?

## 2021-10-12 NOTE — Assessment & Plan Note (Addendum)
Heart rate up to 160s.  Improved now low 100 on Cardizem drip.  On anticoagulation with Eliquis.Last echo 10/2020, EF 55 to 60%. ?-Continue Cardizem drip ?-Resume metoprolol 50 daily ?- Resume Eliquis ?

## 2021-10-12 NOTE — Sepsis Progress Note (Signed)
ELink tracking the code sepsis.  

## 2021-10-13 ENCOUNTER — Inpatient Hospital Stay (HOSPITAL_COMMUNITY): Payer: Medicare Other

## 2021-10-13 DIAGNOSIS — I4891 Unspecified atrial fibrillation: Secondary | ICD-10-CM | POA: Diagnosis not present

## 2021-10-13 LAB — BASIC METABOLIC PANEL
Anion gap: 11 (ref 5–15)
BUN: 61 mg/dL — ABNORMAL HIGH (ref 8–23)
CO2: 22 mmol/L (ref 22–32)
Calcium: 7.9 mg/dL — ABNORMAL LOW (ref 8.9–10.3)
Chloride: 99 mmol/L (ref 98–111)
Creatinine, Ser: 4.73 mg/dL — ABNORMAL HIGH (ref 0.61–1.24)
GFR, Estimated: 13 mL/min — ABNORMAL LOW (ref >60)
Glucose, Bld: 151 mg/dL — ABNORMAL HIGH (ref 70–99)
Potassium: 4.3 mmol/L (ref 3.5–5.1)
Sodium: 132 mmol/L — ABNORMAL LOW (ref 135–145)

## 2021-10-13 LAB — C-REACTIVE PROTEIN: CRP: 13.3 mg/dL — ABNORMAL HIGH (ref <1.0)

## 2021-10-13 LAB — CBC
HCT: 29.4 % — ABNORMAL LOW (ref 39.0–52.0)
Hemoglobin: 9.3 g/dL — ABNORMAL LOW (ref 13.0–17.0)
MCH: 26.8 pg (ref 26.0–34.0)
MCHC: 31.6 g/dL (ref 30.0–36.0)
MCV: 84.7 fL (ref 80.0–100.0)
Platelets: 288 10*3/uL (ref 150–400)
RBC: 3.47 MIL/uL — ABNORMAL LOW (ref 4.22–5.81)
RDW: 17.3 % — ABNORMAL HIGH (ref 11.5–15.5)
WBC: 7.7 10*3/uL (ref 4.0–10.5)
nRBC: 0 % (ref 0.0–0.2)

## 2021-10-13 LAB — GLUCOSE, CAPILLARY
Glucose-Capillary: 148 mg/dL — ABNORMAL HIGH (ref 70–99)
Glucose-Capillary: 159 mg/dL — ABNORMAL HIGH (ref 70–99)
Glucose-Capillary: 147 mg/dL — ABNORMAL HIGH (ref 70–99)
Glucose-Capillary: 160 mg/dL — ABNORMAL HIGH (ref 70–99)

## 2021-10-13 LAB — SEDIMENTATION RATE: Sed Rate: 57 mm/hr — ABNORMAL HIGH (ref 0–16)

## 2021-10-13 MED ORDER — GADOBUTROL 1 MMOL/ML IV SOLN
10.0000 mL | Freq: Once | INTRAVENOUS | Status: AC | PRN
  Administered 2021-10-13: 10 mL via INTRAVENOUS

## 2021-10-13 MED ORDER — CHLORHEXIDINE GLUCONATE CLOTH 2 % EX PADS
6.0000 | MEDICATED_PAD | Freq: Every day | CUTANEOUS | Status: DC
  Administered 2021-10-13 – 2021-10-17 (×5): 6 via TOPICAL

## 2021-10-13 NOTE — Progress Notes (Signed)
?PROGRESS NOTE ? ? ? ?Danny [name] REASON  WNU:[id number] DOB: 1955/12/29 DOA: 10/12/2021 ?PCP: Celene Squibb, MD ? ? ?Brief Narrative:  ?Danny [name] is a 66 y.o. male with medical history significant for hypertension, diabetes mellitus, ESRD Tuesday Thursday Saturday, atrial fibrillation. Patient was brought to the ED reports of left knee pain and increasing weakness over the past 2 days.   ? ?Assessment & Plan: ?  ?Principal Problem: ?  Atrial fibrillation with rapid ventricular response ([place]) ?Active Problems: ?  Decubitus ulcers ?  Essential hypertension ?  Diabetes mellitus ([place]) ?  Chronic diastolic CHF (congestive heart failure) ([place]) ?  End stage renal disease ([place]) ? ?Bilateral decubitus malleoli ulcers,  ?Acute early osteomyelitis cannot be ruled out ?-MRI bilaterally shows changes consistent with acute early osteomyelitis ?-Continue vancomycin and ceftriaxone ?-Follow-up blood cultures -titrate antibiotics accordingly ?  ?Atrial fibrillation with rapid ventricular response ([place]) ?Currently rate controlled Cardizem drip off  ?-Resume metoprolol 50 daily ?- Resume Eliquis ?  ?Chronic diastolic CHF (congestive heart failure) ([place]) ? Last echo 10/2020, EF 55 to 60%.  No appreciable lower extremity swelling.  CT showing small bilateral pleural effusions with interval increase, small ascites, also suggesting possible anasarca.  ?-Per HD ?  ?Diabetes mellitus ([place]) ?- SSI-S ?-Hold NPH 5 units twice daily ?  ?Essential hypertension ?Stable. ?-Resume metoprolol ?  ?DVT prophylaxis:  Eliquis ?Code Status: Full ?Family Communication: None present  ? ?Status is: Inpatient ? ?Dispo: The patient is from: Home ?             Anticipated d/c is to: To be determined ?             Anticipated d/c date is: 48 to 72 hours ?             Patient currently not medically stable for discharge ? ?Consultants:  ?None ? ?Procedures:  ?None ? ?Antimicrobials:  ?Vancomycin, ceftriaxone ? ?Subjective: ?No acute issues or events overnight denies  nausea vomiting diarrhea constipation headache fevers chills or chest pain ? ?Objective: ?Vitals:  ? 10/12/21 2351 10/13/21 0000 10/13/21 0532 10/13/21 1202  ?BP: (!) 108/50 (!) 106/59    ?Pulse: 78 78    ?Resp: (!) 25 15    ?Temp:    98.4 ?F (36.9 ?C)  ?TempSrc:    Oral  ?SpO2: 97% 97%    ?Weight:   115.1 kg   ?Height:      ? ? ?Intake/Output Summary (Last 24 hours) at 10/13/2021 1351 ?Last data filed at 10/13/2021 0000 ?Gross per 24 hour  ?Intake 2638.04 ml  ?Output --  ?Net 2638.04 ml  ? ?Filed Weights  ? 10/12/21 1305 10/13/21 0532  ?Weight: 113.4 kg 115.1 kg  ? ? ?Examination: ? ?General exam: Appears calm and comfortable  ?Respiratory system: Clear to auscultation. Respiratory effort normal. ?Cardiovascular system: irregularly irregular ?Gastrointestinal system: Abdomen is nondistended, soft and nontender. No organomegaly or masses felt. Normal bowel sounds heard. ?Central nervous system: Alert and oriented. No focal neurological deficits. ?Extremities: Symmetric 5 x 5 power. ?Skin: bilateral ankle deep cavitary wounds with noted purulence/drainage ? ? ?Data Reviewed: I have personally reviewed following labs and imaging studies ? ?CBC: ?Recent Labs  ?Lab 10/12/21 ?1327 10/13/21 ?0404  ?WBC 7.9 7.7  ?NEUTROABS 6.2  --   ?HGB 10.0* 9.3*  ?HCT 30.5* 29.4*  ?MCV 84.0 84.7  ?PLT 315 288  ? ?Basic Metabolic Panel: ?Recent Labs  ?Lab 10/12/21 ?1327 10/13/21 ?0404  ?NA 135 132*  ?  K 4.2 4.3  ?CL 97* 99  ?CO2 27 22  ?GLUCOSE 208* 151*  ?BUN 58* 61*  ?CREATININE 4.74* 4.73*  ?CALCIUM 8.1* 7.9*  ? ?GFR: ?Estimated Creatinine Clearance: 19.9 mL/min (A) (by C-G formula based on SCr of 4.73 mg/dL (H)). ?Liver Function Tests: ?Recent Labs  ?Lab 10/12/21 ?1327  ?AST 35  ?ALT 28  ?ALKPHOS 211*  ?BILITOT 1.2  ?PROT 6.9  ?ALBUMIN 2.2*  ? ?No results for input(s): LIPASE, AMYLASE in the last 168 hours. ?No results for input(s): AMMONIA in the last 168 hours. ?Coagulation Profile: ?Recent Labs  ?Lab 10/12/21 ?1327  ?INR 2.1*   ? ?Cardiac Enzymes: ?No results for input(s): CKTOTAL, CKMB, CKMBINDEX, TROPONINI in the last 168 hours. ?BNP (last 3 results) ?No results for input(s): PROBNP in the last 8760 hours. ?HbA1C: ?No results for input(s): HGBA1C in the last 72 hours. ?CBG: ?Recent Labs  ?Lab 10/12/21 ?2208 10/13/21 ?0742 10/13/21 ?1126  ?GLUCAP 198* 148* 159*  ? ?Lipid Profile: ?No results for input(s): CHOL, HDL, LDLCALC, TRIG, CHOLHDL, LDLDIRECT in the last 72 hours. ?Thyroid Function Tests: ?No results for input(s): TSH, T4TOTAL, FREET4, T3FREE, THYROIDAB in the last 72 hours. ?Anemia Panel: ?No results for input(s): VITAMINB12, FOLATE, FERRITIN, TIBC, IRON, RETICCTPCT in the last 72 hours. ?Sepsis Labs: ?Recent Labs  ?Lab 10/12/21 ?1327  ?LATICACIDVEN 1.7  ? ? ?Recent Results (from the past 240 hour(s))  ?Blood Culture (routine x 2)     Status: None (Preliminary result)  ? Collection Time: 10/12/21  1:29 PM  ? Specimen: BLOOD  ?Result Value Ref Range Status  ? Specimen Description BLOOD BLOOD RIGHT ARM  Final  ? Special Requests   Final  ?  BOTTLES DRAWN AEROBIC AND ANAEROBIC Blood Culture results may not be optimal due to an excessive volume of blood received in culture bottles ?Performed at [facility], [address] [place], [place] [address] ?  ? Culture PENDING  Incomplete  ? Report Status PENDING  Incomplete  ?Blood Culture (routine x 2)     Status: None (Preliminary result)  ? Collection Time: 10/12/21  1:29 PM  ? Specimen: BLOOD  ?Result Value Ref Range Status  ? Specimen Description BLOOD BLOOD RIGHT HAND  Final  ? Special Requests   Final  ?  BOTTLES DRAWN AEROBIC AND ANAEROBIC Blood Culture adequate volume ?Performed at [facility], [address], [place], [place] [address] ?  ? Culture PENDING  Incomplete  ? Report Status PENDING  Incomplete  ?Resp Panel by RT-PCR (Flu A&B, Covid) Nasopharyngeal Swab     Status: None  ? Collection Time: 10/12/21  2:12 PM  ? Specimen: Nasopharyngeal Swab; Nasopharyngeal(NP) swabs in  vial transport medium  ?Result Value Ref Range Status  ? SARS Coronavirus 2 by RT PCR NEGATIVE NEGATIVE Final  ?  Comment: (NOTE) ?SARS-CoV-2 target nucleic acids are NOT DETECTED. ? ?The SARS-CoV-2 RNA is generally detectable in upper respiratory ?specimens during the acute phase of infection. The lowest ?concentration of SARS-CoV-2 viral copies this assay can detect is ?138 copies/mL. A negative result does not preclude SARS-Cov-2 ?infection and should not be used as the sole basis for treatment or ?other patient management decisions. A negative result may occur with  ?improper specimen collection/handling, submission of specimen other ?than nasopharyngeal swab, presence of viral mutation(s) within the ?areas targeted by this assay, and inadequate number of viral ?copies(<138 copies/mL). A negative result must be combined with ?clinical observations, patient history, and epidemiological ?information. The expected result is Negative. ? ?  Fact Sheet for Patients:  ?EntrepreneurPulse.com.au ? ?Fact Sheet for Healthcare Providers:  ?IncredibleEmployment.be ? ?This test is no t yet approved or cleared by the [place] FDA and  ?has been authorized for detection and/or diagnosis of SARS-CoV-2 by ?FDA under an Emergency Use Authorization (EUA). This EUA will remain  ?in effect (meaning this test can be used) for the duration of the ?COVID-19 declaration under Section 564(b)(1) of the Act, 21 ?U.S.C.section 360bbb-3(b)(1), unless the authorization is terminated  ?or revoked sooner.  ? ? ?  ? Influenza A by PCR NEGATIVE NEGATIVE Final  ? Influenza B by PCR NEGATIVE NEGATIVE Final  ?  Comment: (NOTE) ?The Xpert Xpress SARS-CoV-2/FLU/RSV plus assay is intended as an aid ?in the diagnosis of influenza from Nasopharyngeal swab specimens and ?should not be used as a sole basis for treatment. Nasal washings and ?aspirates are unacceptable for Xpert Xpress SARS-CoV-2/FLU/RSV ?testing. ? ?Fact  Sheet for Patients: ?EntrepreneurPulse.com.au ? ?Fact Sheet for Healthcare Providers: ?IncredibleEmployment.be ? ?This test is not yet approved or cleared by the [place]

## 2021-10-13 NOTE — TOC Initial Note (Signed)
Transition of Care (TOC) - Initial/Assessment Note  ? ? ?Patient Details  ?Name: Danny [name] ?MRN: [id number] ?Date of Birth: September 10, 1955 ? ?Transition of Care (TOC) CM/SW Contact:    ?Iona Beard, LCSWA ?Phone Number: ?10/13/2021, 9:09 AM ? ?Clinical Narrative:                 ?Pt is high risk for readmission. CSW reached out to Banks in admissions at [facility] who states that pt is a long term resident at their facility. Pt will be able to return to facility at D/C. [name] asks that a new Fl2 be completed prior to D/C. TOC to follow.  ? ?Expected Discharge Plan: [place] ?Barriers to Discharge: Continued Medical Work up ? ? ?Patient Goals and CMS Choice ?Patient states their goals for this hospitalization and ongoing recovery are:: Return to facility ?CMS Medicare.gov Compare Post Acute Care list provided to:: Patient ?Choice offered to / list presented to : Patient ? ?Expected Discharge Plan and Services ?Expected Discharge Plan: [place] ?  ?  ?Post Acute Care Choice: Nursing Home ?Living arrangements for the past 2 months: [place] ?                ?  ?  ?  ?  ?  ?  ?  ?  ?  ?  ? ?Prior Living Arrangements/Services ?Living arrangements for the past 2 months: [place] ?Lives with:: Facility Resident ?Patient language and need for interpreter reviewed:: Yes ?Do you feel safe going back to the place where you live?: Yes      ?Need for Family Participation in Patient Care: Yes (Comment) ?Care giver support system in place?: Yes (comment) ?  ?Criminal Activity/Legal Involvement Pertinent to Current Situation/Hospitalization: No - Comment as needed ? ?Activities of Daily Living ?Home Assistive Devices/Equipment: CBG Meter, Wheelchair ?ADL Screening (condition at time of admission) ?Patient's cognitive ability adequate to safely complete daily activities?: Yes ?Is the patient deaf or have difficulty hearing?: No ?Does the patient have difficulty  seeing, even when wearing glasses/contacts?: No ?Does the patient have difficulty concentrating, remembering, or making decisions?: No ?Patient able to express need for assistance with ADLs?: Yes ?Does the patient have difficulty dressing or bathing?: Yes ?Independently performs ADLs?: No ?Communication: Independent ?Dressing (OT): Dependent ?Is this a change from baseline?: Pre-admission baseline ?Grooming: Dependent ?Is this a change from baseline?: Pre-admission baseline ?Feeding: Independent ?Bathing: Dependent ?Is this a change from baseline?: Pre-admission baseline ?Toileting: Dependent ?Is this a change from baseline?: Pre-admission baseline ?In/Out Bed: Dependent ?Is this a change from baseline?: Pre-admission baseline ?Walks in Home: Dependent ?Is this a change from baseline?: Pre-admission baseline ?Does the patient have difficulty walking or climbing stairs?: Yes ?Weakness of Legs: Both ?Weakness of Arms/Hands: None ? ?Permission Sought/Granted ?  ?  ?   ?   ?   ?   ? ?Emotional Assessment ?Appearance:: Appears stated age ?Attitude/Demeanor/Rapport: Engaged ?Affect (typically observed): Accepting ?Orientation: : Oriented to Self, Oriented to Place, Oriented to  Time, Oriented to Situation ?Alcohol / Substance Use: Not Applicable ?Psych Involvement: No (comment) ? ?Admission diagnosis:  ESRD (end stage renal disease) ([place]) [N18.6] ?Atrial fibrillation with rapid ventricular response ([place]) [I48.91] ?Atrial flutter with rapid ventricular response ([place]) [I48.92] ?Generalized weakness [R53.1] ?Patient Active Problem List  ? Diagnosis Date Noted  ? Atrial fibrillation with rapid ventricular response ([place]) 10/12/2021  ? Atrial fibrillation ([place]) 08/12/2021  ? Type 2 diabetes mellitus  with hyperlipidemia ([place]) 08/12/2021  ? Class 1 obesity 08/12/2021  ? Iron deficiency anemia 04/21/2021  ? Goals of care, counseling/discussion 01/20/2021  ? Acute metabolic encephalopathy 70/06/7492  ? End stage renal disease ([place])    ? Symptomatic anemia 01/12/2021  ? B12 deficiency 12/06/2020  ? Osteomyelitis of right ankle ([place])   ? Cellulitis, leg 10/28/2020  ? CKD (chronic kidney disease) stage 4, GFR 15-29 ml/min (HCC) 10/28/2020  ? Moderate protein-calorie malnutrition ([place]) 10/26/2020  ? Medical non-compliance 10/26/2020  ? Chronic ulcer of right ankle ([place])   ? Venous stasis   ? Cellulitis 10/25/2020  ? Normocytic anemia 09/13/2020  ? Class 2 obesity   ? Respiratory failure ([place]) 03/23/2020  ? UTI (urinary tract infection), bacterial 01/22/2020  ? Acute on chronic anemia 01/16/2020  ? Benign prostatic hyperplasia with urinary obstruction 01/15/2020  ? AKI (acute kidney injury) ([place]) 07/08/2019  ? Hypoxia 07/08/2019  ? Decubitus ulcers 07/08/2019  ? Edema of both lower extremities   ? Anasarca 03/23/2019  ? Bladder outlet obstruction 03/23/2019  ? Yeast infection of the skin 03/16/2019  ? Diabetic ulcer of left foot ([place]) 03/15/2019  ? Diabetic ulcer of ankle ([place]) 03/15/2019  ? Hypertension associated with stage 3 chronic kidney disease due to type 2 diabetes mellitus ([place]) 03/09/2019  ? Controlled type 2 diabetes mellitus with stage 3 chronic kidney disease, with long-term current use of insulin ([place]) 03/09/2019  ? Type 2 diabetes with nephropathy ([place]) 03/09/2019  ? Chronic gout due to renal impairment without tophus 03/09/2019  ? Emphysematous cystitis 03/05/2019  ? Bilateral hydronephrosis   ? Bilateral cellulitis of lower leg 03/03/2019  ? Urinary retention 03/03/2019  ? Klebsiella Cystitis 03/03/2019  ? UTI due to Klebsiella species 03/03/2019  ? Plantar ulcer of left foot ([place]) 03/03/2019  ? Chronic diastolic CHF (congestive heart failure) ([place]) 02/25/2019  ? Acute respiratory failure with hypoxia ([place]) 02/25/2019  ? Acute renal failure superimposed on stage 4 chronic kidney disease ([place]) 02/24/2019  ? Congestive heart failure ([place]) 02/24/2019  ? Dyspnea 02/23/2019  ? Essential hypertension 02/23/2019  ? Diabetes mellitus ([place])  02/23/2019  ? CKD (chronic kidney disease) 02/23/2019  ? Psoriasis 02/23/2019  ? ?PCP:  Celene Squibb, MD ?Pharmacy:   ?[place], [place] [address] ?[address] ?[place] [address] ?Phone: [phone] Fax: [phone] ? ?[place] ?[place] [address] ?Phone: [phone] Fax: [phone] ? ?[place], [place] ?[address] ?[address] ?[place] [address] ?Phone: [phone] Fax: [phone] ? ? ? ? ?Social Determinants of Health (SDOH) Interventions ?  ? ?Readmission Risk Interventions ? ?  10/13/2021  ?  9:08 AM 01/27/2021  ? 12:30 PM 01/13/2021  ?  1:30 PM  ?Readmission Risk Prevention Plan  ?Transportation Screening Complete Complete Complete  ?Medication Review Press photographer) Complete Complete Complete  ?PCP or Specialist appointment within 3-5 days of discharge Complete Complete   ?[place] or Home Care Consult Complete Complete Complete  ?SW Recovery Care/Counseling Consult Complete Complete Complete  ?Palliative Care Screening Not Applicable Not Complete Not Complete  ?Skilled Nursing Facility Complete Complete Complete  ? ? ? ?

## 2021-10-13 NOTE — Consult Note (Signed)
WOC Nurse Consult Note: ?Reason for Consult:Bilateral lateral malleolus full thickness wounds, R>L. Diabetes, Smoking, ESRD. ?Wound type: Suspected venous insufficiency given location, edema and discolored gaiter area. Cannot rule out calciphylaxis or mixed etiology. Patient has no report of pain ?Pressure Injury POA:N/A ?Measurement: See both Photos taken on admissions which contain a wound measuring guide and also the Nursing Flow Sheet. ?Left: 2.5cm x 2cm x 0.4cm ?Right: 5cm x 4cm x 0.4cm ?Wound bed: Red, moist ?Drainage (amount, consistency, odor) moderate serous ?Periwound:erythematous, edematous ?Dressing procedure/placement/frequency: I will provide nursing with guidance for the daily care of these wound that will consist of cleansing to reduce bacterial load followed by placement of an antimicrobial wound contact layer with absorbant properties (silver hydrofiber, Aquacel Ag+ Advantage). This will be covered with dry gauze and secured with toe to knee Kerlix roll gauze and topped with an ACE bandage wrapped in a similar manner. ? ?Recommend follow up in the outpatient wound care center of his choosing. If you agree, please refer/consult upon discharge. ? ?[facility] nursing team will not follow, but will remain available to this patient, the nursing and medical teams.  Please re-consult if needed. ?Thanks, ?Maudie Flakes, MSN, RN, [place], [place], CWON-[facility], [place]  ?Pager# [phone]  ? ? ? ?  ?

## 2021-10-13 NOTE — Plan of Care (Signed)

## 2021-10-14 DIAGNOSIS — I4891 Unspecified atrial fibrillation: Secondary | ICD-10-CM | POA: Diagnosis not present

## 2021-10-14 LAB — RENAL FUNCTION PANEL
Albumin: 1.7 g/dL — ABNORMAL LOW (ref 3.5–5.0)
Anion gap: 12 (ref 5–15)
BUN: 75 mg/dL — ABNORMAL HIGH (ref 8–23)
CO2: 22 mmol/L (ref 22–32)
Calcium: 7.9 mg/dL — ABNORMAL LOW (ref 8.9–10.3)
Chloride: 95 mmol/L — ABNORMAL LOW (ref 98–111)
Creatinine, Ser: 6.02 mg/dL — ABNORMAL HIGH (ref 0.61–1.24)
GFR, Estimated: 10 mL/min — ABNORMAL LOW (ref >60)
Glucose, Bld: 175 mg/dL — ABNORMAL HIGH (ref 70–99)
Phosphorus: 6 mg/dL — ABNORMAL HIGH (ref 2.5–4.6)
Potassium: 4.8 mmol/L (ref 3.5–5.1)
Sodium: 129 mmol/L — ABNORMAL LOW (ref 135–145)

## 2021-10-14 LAB — HEPATITIS B SURFACE ANTIBODY,QUALITATIVE: Hep B S Ab: NONREACTIVE

## 2021-10-14 LAB — CBC
HCT: 26 % — ABNORMAL LOW (ref 39.0–52.0)
Hemoglobin: 8.9 g/dL — ABNORMAL LOW (ref 13.0–17.0)
MCH: 27.9 pg (ref 26.0–34.0)
MCHC: 34.2 g/dL (ref 30.0–36.0)
MCV: 81.5 fL (ref 80.0–100.0)
Platelets: 254 10*3/uL (ref 150–400)
RBC: 3.19 MIL/uL — ABNORMAL LOW (ref 4.22–5.81)
RDW: 17.1 % — ABNORMAL HIGH (ref 11.5–15.5)
WBC: 7.8 10*3/uL (ref 4.0–10.5)
nRBC: 0 % (ref 0.0–0.2)

## 2021-10-14 LAB — GLUCOSE, CAPILLARY
Glucose-Capillary: 149 mg/dL — ABNORMAL HIGH (ref 70–99)
Glucose-Capillary: 155 mg/dL — ABNORMAL HIGH (ref 70–99)
Glucose-Capillary: 91 mg/dL (ref 70–99)

## 2021-10-14 LAB — HEPATITIS B SURFACE ANTIGEN: Hepatitis B Surface Ag: NONREACTIVE

## 2021-10-14 LAB — MRSA NEXT GEN BY PCR, NASAL: MRSA by PCR Next Gen: NOT DETECTED

## 2021-10-14 MED ORDER — SODIUM CHLORIDE 0.9 % IV SOLN: 100.0000 mL | INTRAVENOUS | Status: DC | PRN

## 2021-10-14 MED ORDER — LIDOCAINE HCL (PF) 1 % IJ SOLN: 5.0000 mL | INTRAMUSCULAR | Status: DC | PRN

## 2021-10-14 MED ORDER — DARBEPOETIN ALFA 100 MCG/0.5ML IJ SOSY
100.0000 ug | PREFILLED_SYRINGE | INTRAMUSCULAR | Status: DC
  Administered 2021-10-14: 100 ug via INTRAVENOUS
  Filled 2021-10-14: qty 0.5

## 2021-10-14 MED ORDER — DARBEPOETIN ALFA 100 MCG/0.5ML IJ SOSY
PREFILLED_SYRINGE | INTRAMUSCULAR | Status: AC
  Filled 2021-10-14: qty 0.5

## 2021-10-14 MED ORDER — ALBUMIN HUMAN 25 % IV SOLN
INTRAVENOUS | Status: AC
  Filled 2021-10-14: qty 200

## 2021-10-14 MED ORDER — LIDOCAINE-PRILOCAINE 2.5-2.5 % EX CREA: 1.0000 "application " | TOPICAL_CREAM | CUTANEOUS | Status: DC | PRN

## 2021-10-14 MED ORDER — PENTAFLUOROPROP-TETRAFLUOROETH EX AERO
1.0000 "application " | INHALATION_SPRAY | CUTANEOUS | Status: DC | PRN
  Filled 2021-10-14: qty 30

## 2021-10-14 MED ORDER — HEPARIN SODIUM (PORCINE) 1000 UNIT/ML DIALYSIS: 1000.0000 [IU] | INTRAMUSCULAR | Status: DC | PRN

## 2021-10-14 MED ORDER — CHLORHEXIDINE GLUCONATE CLOTH 2 % EX PADS
6.0000 | MEDICATED_PAD | Freq: Every day | CUTANEOUS | Status: DC
  Administered 2021-10-14 – 2021-10-17 (×4): 6 via TOPICAL

## 2021-10-14 MED ORDER — ALBUMIN HUMAN 25 % IV SOLN
25.0000 g | INTRAVENOUS | Status: DC | PRN
  Administered 2021-10-14 – 2021-10-17 (×3): 25 g via INTRAVENOUS
  Filled 2021-10-14: qty 100

## 2021-10-14 MED ORDER — ALTEPLASE 2 MG IJ SOLR
2.0000 mg | Freq: Once | INTRAMUSCULAR | Status: DC | PRN
  Filled 2021-10-14: qty 2

## 2021-10-14 MED ORDER — HEPARIN SODIUM (PORCINE) 1000 UNIT/ML IJ SOLN
INTRAMUSCULAR | Status: AC
  Filled 2021-10-14: qty 2

## 2021-10-14 NOTE — Progress Notes (Signed)
Patient noted on monitor to desaturate down to 79%. Upon assessment, patient sleeping and mouth breathing. Woke patient and saturations increased to 82% and after 4 minutes patient back up to 98%. Informed patient that if he still wanted to sleep I would place him on 2 L to maintain saturations up while he was sleeping. Patient agreeable.  ?

## 2021-10-14 NOTE — Consult Note (Signed)
[facility] ?Renal Consultation Note  ?  ?Indication for Consultation:  Management of ESRD/hemodialysis; anemia, hypertension/volume and secondary hyperparathyroidism ? ?HPI: Danny [name] is a 66 y.o. male with a PMH significant for HTN, DM, gout, anemia, atrial fibrillation, and ESRD on HD TTS at [facility] who presented to [facility] ED with left knee pain and weakness.  In the ED he was afebrile but tachycardic at 162.  Labs were notable for CRP 13.3, WBC 7.7, Hgb 9.3, Na 132, K 4.3, BUN 61, Cr 4.73, Ca 7.9.  MRI showed changes consistent with early osteomyelitis of bilateral ankles.  He is admitted for IV antibiotics and we were consulted to provide dialysis during his hospitalization.  ? ?Past Medical History:  ?Diagnosis Date  ? Anemia   ? Ankle ulcer ([place])   ? Arthritis   ? CKD (chronic kidney disease), stage V ([place])   ? Diabetes mellitus without complication ([place])   ? Diastolic congestive heart failure ([place])   ? Foot ulcer ([place])   ? Gout   ? Hypertension   ? Urinary retention   ? ?Past Surgical History:  ?Procedure Laterality Date  ? ANKLE SURGERY Right   ? AV FISTULA PLACEMENT Left 04/26/2021  ? Procedure: LEFT ARM ARTERIOVENOUS (AV) FISTULA CREATION (BRACHIOCEPHALIC);  Surgeon: Rosetta Posner, MD;  Location: [facility];  Service: Vascular;  Laterality: Left;  ? CHOLECYSTECTOMY    ? FISTULA SUPERFICIALIZATION Left 07/12/2021  ? Procedure: LEFT ARM ARTERIOVENOUS FISTULA SUPERFICIALIZATION;  Surgeon: Rosetta Posner, MD;  Location: [facility];  Service: Vascular;  Laterality: Left;  ? FOOT SURGERY Right   ? INSERTION OF DIALYSIS CATHETER Right 01/14/2021  ? Procedure: INSERTION OF TUNNELED DIALYSIS CATHETER RIGHT INTERNAL JUGULAR;  Surgeon: Virl Cagey, MD;  Location: [facility];  Service: General;  Laterality: Right;  ? REMOVAL OF A DIALYSIS CATHETER N/A 09/06/2021  ? Procedure: MINOR REMOVAL OF TUNNELED DIALYSIS CATHETER;  Surgeon: Rosetta Posner, MD;  Location: [facility];  Service: Vascular;  Laterality: N/A;  ?  TRANSURETHRAL RESECTION OF PROSTATE N/A 01/15/2020  ? Procedure: TRANSURETHRAL RESECTION OF THE PROSTATE (TURP)  with General anesthesia and spinal;  Surgeon: Cleon Gustin, MD;  Location: [facility];  Service: Urology;  Laterality: N/A;  ? ?Family History:   ?Family History  ?Problem Relation Age of Onset  ? Diabetes Mother   ? Heart attack Mother   ? Heart attack Father   ? Diabetes Brother   ? ?Social History: ? reports that he quit smoking about 34 years ago. His smoking use included cigarettes. He has a 15.00 pack-year smoking history. He has never used smokeless tobacco. He reports that he does not currently use alcohol. He reports that he does not use drugs. ?Allergies  ?Allergen Reactions  ? Dust Mite Extract Itching and Other (See Comments)  ?  Unknown reaction-potential shortness of breath  ? Prednisone Nausea And Vomiting  ? Rocephin [Ceftriaxone] Nausea And Vomiting  ? ?Prior to Admission medications   ?Medication Sig Start Date End Date Taking? Authorizing Provider  ?acetaminophen (TYLENOL) 500 MG tablet Take 1,000 mg by mouth every 6 (six) hours as needed for moderate pain or headache.   Yes [provider]  ?apixaban (ELIQUIS) 5 MG TABS tablet Take 1 tablet (5 mg total) by mouth 2 (two) times daily. 01/27/21  Yes Kathie Dike, MD  ?cholecalciferol (VITAMIN D3) 25 MCG (1000 UNIT) tablet Take 1,000 Units by mouth See admin instructions. Monday's, Wednesday's, Friday's, and Sunday's in the  morning,and Tuesday's, Thursday's, and Saturday's at bedtime   Yes [provider]  ?Cyanocobalamin (B-12 COMPLIANCE INJECTION) 1000 MCG/ML KIT Inject 1,000 mcg as directed every 28 (twenty-eight) days.   Yes [provider]  ?docusate sodium (COLACE) 100 MG capsule Take 100 mg by mouth 2 (two) times daily.   Yes [provider]  ?ferrous sulfate 325 (65 FE) MG tablet Take 325 mg by mouth 2 (two) times daily.   Yes [provider]  ?fluticasone (FLONASE) 50 MCG/ACT nasal  spray Place 2 sprays into both nostrils daily. 06/01/21  Yes [provider]  ?folic acid (FOLVITE) 045 MCG tablet Take 1 tablet (400 mcg total) by mouth daily. 08/29/21  Yes [name], PA-C  ?Insulin NPH Isophane & Regular (HUMULIN 70/30 KWIKPEN [place]) Inject 5 Units into the skin 2 (two) times daily.   Yes [provider]  ?Lactulose 20 GM/30ML SOLN Take 30 mLs (20 g total) by mouth daily. ?Patient taking differently: Take 20 g by mouth 2 (two) times daily. 11/11/20  Yes [name], MD  ?metoprolol succinate (TOPROL XL) 50 MG 24 hr tablet Take 1 tablet (50 mg total) by mouth in the morning and at bedtime. Take with or immediately following a meal. 08/15/21 08/15/22 Yes Barton Dubois, MD  ?midodrine (PROAMATINE) 10 MG tablet Take 10 mg by mouth Every Tuesday,Thursday,and Saturday with dialysis. 08/06/21  Yes [provider]  ?Multiple Vitamin (MULTIVITAMIN WITH MINERALS) TABS tablet Take 1 tablet by mouth daily.   Yes [provider]  ?Nutritional Supplements (FEEDING SUPPLEMENT, NEPRO CARB STEADY,) LIQD Take 237 mLs by mouth 2 (two) times daily between meals. 11/05/20  Yes Barton Dubois, MD  ?Omega-3 Fatty Acids (FISH OIL) 500 MG CAPS Take 500 mg by mouth 2 (two) times daily.   Yes [provider]  ?ondansetron (ZOFRAN) 4 MG tablet Take 4 mg by mouth every 6 (six) hours as needed for nausea or vomiting.   Yes [provider]  ?oxycodone (OXY-IR) 5 MG capsule Take 1 capsule (5 mg total) by mouth every 6 (six) hours as needed (severe pain). ?Patient taking differently: Take 5 mg by mouth every 6 (six) hours as needed (moderate to severe pain). 08/16/21  Yes Barton Dubois, MD  ?polyethylene glycol (MIRALAX / GLYCOLAX) 17 g packet Take 17 g by mouth daily. 01/28/21  Yes Kathie Dike, MD  ?PROTEIN PO Take 30 mLs by mouth 3 (three) times daily.   Yes [provider]  ?rosuvastatin (CRESTOR) 5 MG tablet Take 5 mg by mouth every evening.   Yes  [provider]  ?senna (SENOKOT) 8.6 MG tablet Take 2 tablets by mouth 2 (two) times daily.   Yes [provider]  ?sevelamer carbonate (RENVELA) 800 MG tablet Take 1 tablet (800 mg total) by mouth 2 (two) times daily with a meal. 01/27/21  Yes Kathie Dike, MD  ?sodium phosphate (FLEET) 7-19 GM/118ML ENEM Place 1 enema rectally daily as needed for severe constipation.   Yes [provider]  ?vitamin C (ASCORBIC ACID) 500 MG tablet Take 1,000 mg by mouth 2 (two) times daily.   Yes [provider]  ?vitamin E 1000 UNIT capsule Take 1,000 Units by mouth See admin instructions. Monday's, Wednesday's, Friday's, and Sunday's in the morning, and Tuesday's, Thursday's, and Saturday's at bedtime.   Yes [provider]  ?zinc sulfate 220 (50 Zn) MG capsule Take 220 mg by mouth daily.   Yes [provider]  ?tamsulosin (FLOMAX) 0.4 MG CAPS  capsule Take 1 capsule (0.4 mg total) by mouth daily after supper. ?Patient not taking: Reported on 10/13/2021 09/09/21   Cleon Gustin, MD  ? ?Current Facility-Administered Medications  ?Medication Dose Route Frequency Provider Last Rate Last Admin  ? acetaminophen (TYLENOL) tablet 650 mg  650 mg Oral Q6H PRN Emokpae, Ejiroghene E, MD      ? Or  ? acetaminophen (TYLENOL) suppository 650 mg  650 mg Rectal Q6H PRN Emokpae, Ejiroghene E, MD      ? apixaban (ELIQUIS) tablet 5 mg  5 mg Oral BID Emokpae, Ejiroghene E, MD   5 mg at 10/13/21 2157  ? cefTRIAXone (ROCEPHIN) 2 g in sodium chloride 0.9 % 100 mL IVPB  2 g Intravenous Q24H Emokpae, Ejiroghene E, MD 200 mL/hr at 10/13/21 1719 2 g at 10/13/21 1719  ? Chlorhexidine Gluconate Cloth 2 % PADS 6 each  6 each Topical Daily Adefeso, Oladapo, DO   6 each at 10/13/21 1159  ? diltiazem (CARDIZEM) 125 mg in dextrose 5% 125 mL (1 mg/mL) infusion  5-15 mg/hr Intravenous Continuous Emokpae, Ejiroghene E, MD 5 mL/hr at 10/13/21 1957 5 mg/hr at 10/13/21 1957  ? insulin aspart (novoLOG) injection  0-5 Units  0-5 Units Subcutaneous QHS Emokpae, Ejiroghene E, MD      ? insulin aspart (novoLOG) injection 0-9 Units  0-9 Units Subcutaneous TID WC Emokpae, Ejiroghene E, MD   1 Units at 10/14/21 [id number]

## 2021-10-14 NOTE — Progress Notes (Signed)
?PROGRESS NOTE ? ? ? ?Danny [name]  HUT:[id number] DOB: 09-Sep-1955 DOA: 10/12/2021 ?PCP: Celene Squibb, MD ? ? ?Brief Narrative:  ?Danny [name] is a 66 y.o. male with medical history significant for hypertension, diabetes mellitus, ESRD Tuesday Thursday Saturday, atrial fibrillation. Patient was brought to the ED reports of left knee pain and increasing weakness over the past 2 days.   ? ?Assessment & Plan: ?  ?Principal Problem: ?  Atrial fibrillation with rapid ventricular response ([place]) ?Active Problems: ?  Decubitus ulcers ?  Essential hypertension ?  Diabetes mellitus ([place]) ?  Chronic diastolic CHF (congestive heart failure) ([place]) ?  End stage renal disease ([place]) ? ?Bilateral decubitus malleoli ulcers,  ?Acute early osteomyelitis likely, POA ?-Patient refusing further discussion about amputation or debridement ?-Discussed case with infectious disease, okay to transition to doxycycline and cephalexin in the interim and follow clinically -we will transition to p.o. in the next 24 hours to ensure tolerance with ultimate plan for discharge and outpatient follow-up with infectious disease and likely wound care ?-MRI bilaterally shows changes consistent with acute early osteomyelitis ?-Continue vancomycin and ceftriaxone for now, transition to p.o. as above for the next 24 hours pending blood culture finalization (negative to date) ?  ?Atrial fibrillation with rapid ventricular response ([place]) ?-Rate controlled on metoprolol 50 daily -May transition to calcium channel blocker given borderline soft blood pressure today ?-Continue Eliquis ?  ?Chronic diastolic CHF (congestive heart failure) ([place]) ?-Last echo 10/2020, EF 55 to 60%.  No appreciable lower extremity swelling.  CT showing small bilateral pleural effusions with interval increase, small ascites, also suggesting possible anasarca.  ?-Volume management per HD ?  ?Diabetes mellitus ([place]) ?- SSI-S ?-Hold NPH 5 units twice daily ?  ?ESRD  ?-Follow-up with nephrology  for Tuesday Thursday Saturday dialysis ? ?Essential hypertension ?Stable. ?-Resume metoprolol ?  ?DVT prophylaxis:  Eliquis ?Code Status: Full ?Family Communication: None present  ? ?Status is: Inpatient ? ?Dispo: The patient is from: Home ?             Anticipated d/c is to: To be determined ?             Anticipated d/c date is: 24 - 48 hours ?             Patient currently not medically stable for discharge ? ?Consultants:  ?None ? ?Procedures:  ?None ? ?Antimicrobials:  ?Vancomycin, ceftriaxone ? ?Subjective: ?No acute issues or events overnight denies nausea vomiting diarrhea constipation headache fevers chills or chest pain ? ?Objective: ?Vitals:  ? 10/14/21 0300 10/14/21 0400 10/14/21 0420 10/14/21 0500  ?BP: 107/67 (!) 98/59  (!) 102/58  ?Pulse: (!) 101 100  (!) 101  ?Resp: '16 16  20  '$ ?Temp:   98.1 ?F (36.7 ?C)   ?TempSrc:   Oral   ?SpO2: 95% 94%  95%  ?Weight:   116.5 kg   ?Height:      ? ? ?Intake/Output Summary (Last 24 hours) at 10/14/2021 0808 ?Last data filed at 10/14/2021 0400 ?Gross per 24 hour  ?Intake 182.42 ml  ?Output 450 ml  ?Net -267.58 ml  ? ? ?Filed Weights  ? 10/12/21 1305 10/13/21 0532 10/14/21 0420  ?Weight: 113.4 kg 115.1 kg 116.5 kg  ? ? ?Examination: ? ?General exam: Appears calm and comfortable  ?Respiratory system: Clear to auscultation. Respiratory effort normal. ?Cardiovascular system: irregularly irregular ?Gastrointestinal system: Abdomen is nondistended, soft and nontender. No organomegaly or masses felt. Normal bowel sounds heard. ?Central nervous  system: Alert and oriented. No focal neurological deficits. ?Extremities: Symmetric 5 x 5 power. ?Skin: bilateral ankle deep cavitary wounds with noted purulence/drainage ? ? ?Data Reviewed: I have personally reviewed following labs and imaging studies ? ?CBC: ?Recent Labs  ?Lab 10/12/21 ?1327 10/13/21 ?0404  ?WBC 7.9 7.7  ?NEUTROABS 6.2  --   ?HGB 10.0* 9.3*  ?HCT 30.5* 29.4*  ?MCV 84.0 84.7  ?PLT 315 288  ? ? ?Basic Metabolic  Panel: ?Recent Labs  ?Lab 10/12/21 ?1327 10/13/21 ?0404  ?NA 135 132*  ?K 4.2 4.3  ?CL 97* 99  ?CO2 27 22  ?GLUCOSE 208* 151*  ?BUN 58* 61*  ?CREATININE 4.74* 4.73*  ?CALCIUM 8.1* 7.9*  ? ? ?GFR: ?Estimated Creatinine Clearance: 20 mL/min (A) (by C-G formula based on SCr of 4.73 mg/dL (H)). ?Liver Function Tests: ?Recent Labs  ?Lab 10/12/21 ?1327  ?AST 35  ?ALT 28  ?ALKPHOS 211*  ?BILITOT 1.2  ?PROT 6.9  ?ALBUMIN 2.2*  ? ? ?No results for input(s): LIPASE, AMYLASE in the last 168 hours. ?No results for input(s): AMMONIA in the last 168 hours. ?Coagulation Profile: ?Recent Labs  ?Lab 10/12/21 ?1327  ?INR 2.1*  ? ? ?Cardiac Enzymes: ?No results for input(s): CKTOTAL, CKMB, CKMBINDEX, TROPONINI in the last 168 hours. ?BNP (last 3 results) ?No results for input(s): PROBNP in the last 8760 hours. ?HbA1C: ?No results for input(s): HGBA1C in the last 72 hours. ?CBG: ?Recent Labs  ?Lab 10/13/21 ?0742 10/13/21 ?1126 10/13/21 ?1726 10/13/21 ?2107 10/14/21 ?0730  ?GLUCAP 148* 159* 147* 160* 149*  ? ? ?Lipid Profile: ?No results for input(s): CHOL, HDL, LDLCALC, TRIG, CHOLHDL, LDLDIRECT in the last 72 hours. ?Thyroid Function Tests: ?No results for input(s): TSH, T4TOTAL, FREET4, T3FREE, THYROIDAB in the last 72 hours. ?Anemia Panel: ?No results for input(s): VITAMINB12, FOLATE, FERRITIN, TIBC, IRON, RETICCTPCT in the last 72 hours. ?Sepsis Labs: ?Recent Labs  ?Lab 10/12/21 ?1327  ?LATICACIDVEN 1.7  ? ? ? ?Recent Results (from the past 240 hour(s))  ?Blood Culture (routine x 2)     Status: None (Preliminary result)  ? Collection Time: 10/12/21  1:29 PM  ? Specimen: BLOOD  ?Result Value Ref Range Status  ? Specimen Description BLOOD BLOOD RIGHT ARM  Final  ? Special Requests   Final  ?  BOTTLES DRAWN AEROBIC AND ANAEROBIC Blood Culture results may not be optimal due to an excessive volume of blood received in culture bottles  ? Culture   Final  ?  NO GROWTH 2 DAYS ?Performed at [facility], [address], [place], [place]  [address] ?  ? Report Status PENDING  Incomplete  ?Blood Culture (routine x 2)     Status: None (Preliminary result)  ? Collection Time: 10/12/21  1:29 PM  ? Specimen: BLOOD  ?Result Value Ref Range Status  ? Specimen Description BLOOD BLOOD RIGHT HAND  Final  ? Special Requests   Final  ?  BOTTLES DRAWN AEROBIC AND ANAEROBIC Blood Culture adequate volume  ? Culture   Final  ?  NO GROWTH 2 DAYS ?Performed at [facility], [address] [place], [place] [address] ?  ? Report Status PENDING  Incomplete  ?Resp Panel by RT-PCR (Flu A&B, Covid) Nasopharyngeal Swab     Status: None  ? Collection Time: 10/12/21  2:12 PM  ? Specimen: Nasopharyngeal Swab; Nasopharyngeal(NP) swabs in vial transport medium  ?Result Value Ref Range Status  ? SARS Coronavirus 2 by RT PCR NEGATIVE NEGATIVE Final  ?  Comment: (NOTE) ?  SARS-CoV-2 target nucleic acids are NOT DETECTED. ? ?The SARS-CoV-2 RNA is generally detectable in upper respiratory ?specimens during the acute phase of infection. The lowest ?concentration of SARS-CoV-2 viral copies this assay can detect is ?138 copies/mL. A negative result does not preclude SARS-Cov-2 ?infection and should not be used as the sole basis for treatment or ?other patient management decisions. A negative result may occur with  ?improper specimen collection/handling, submission of specimen other ?than nasopharyngeal swab, presence of viral mutation(s) within the ?areas targeted by this assay, and inadequate number of viral ?copies(<138 copies/mL). A negative result must be combined with ?clinical observations, patient history, and epidemiological ?information. The expected result is Negative. ? ?Fact Sheet for Patients:  ?EntrepreneurPulse.com.au ? ?Fact Sheet for Healthcare Providers:  ?IncredibleEmployment.be ? ?This test is no t yet approved or cleared by the [place] FDA and  ?has been authorized for detection and/or diagnosis of SARS-CoV-2 by ?FDA under an  Emergency Use Authorization (EUA). This EUA will remain  ?in effect (meaning this test can be used) for the duration of the ?COVID-19 declaration under Section 564(b)(1) of the Act, 21 ?U.S.C.section 360bbb-3(b)(1), u

## 2021-10-14 NOTE — Plan of Care (Signed)

## 2021-10-15 DIAGNOSIS — I4891 Unspecified atrial fibrillation: Secondary | ICD-10-CM | POA: Diagnosis not present

## 2021-10-15 LAB — GLUCOSE, CAPILLARY
Glucose-Capillary: 154 mg/dL — ABNORMAL HIGH (ref 70–99)
Glucose-Capillary: 149 mg/dL — ABNORMAL HIGH (ref 70–99)
Glucose-Capillary: 175 mg/dL — ABNORMAL HIGH (ref 70–99)
Glucose-Capillary: 179 mg/dL — ABNORMAL HIGH (ref 70–99)

## 2021-10-15 MED ORDER — DOXYCYCLINE HYCLATE 100 MG PO TABS
100.0000 mg | ORAL_TABLET | Freq: Two times a day (BID) | ORAL | Status: DC
Start: 2021-10-15 — End: 2021-10-18
  Administered 2021-10-15 – 2021-10-17 (×6): 100 mg via ORAL
  Filled 2021-10-15 (×6): qty 1

## 2021-10-15 MED ORDER — CEPHALEXIN 250 MG PO CAPS
250.0000 mg | ORAL_CAPSULE | Freq: Three times a day (TID) | ORAL | Status: DC
  Administered 2021-10-15 – 2021-10-17 (×7): 250 mg via ORAL
  Filled 2021-10-15 (×7): qty 1

## 2021-10-15 NOTE — Progress Notes (Signed)
Pt arrived from ICU to floor oriented to room and call light. See MAR for med administration. Call light and phone within reach.  ?

## 2021-10-15 NOTE — Progress Notes (Signed)
?PROGRESS NOTE ? ? ? ?Danny [name]  BPZ:[id number] DOB: 19-Feb-1956 DOA: 10/12/2021 ?PCP: Celene Squibb, MD ? ? ?Brief Narrative:  ?Danny [name] is a 66 y.o. male with medical history significant for hypertension, diabetes mellitus, ESRD Tuesday Thursday Saturday, atrial fibrillation. Patient was brought to the ED reports of left knee pain and increasing weakness over the past 2 days.   ? ?Assessment & Plan: ?  ?Principal Problem: ?  Atrial fibrillation with rapid ventricular response ([place]) ?Active Problems: ?  Decubitus ulcers ?  Essential hypertension ?  Diabetes mellitus ([place]) ?  Chronic diastolic CHF (congestive heart failure) ([place]) ?  End stage renal disease ([place]) ? ?Bilateral decubitus malleoli ulcers,  ?Acute early osteomyelitis likely, POA ?-Patient refusing further discussion about amputation or debridement ?-Discussed case with infectious disease, okay to transition to doxycycline and cephalexin in the interim and follow clinically; if tolerated well will continue at discharge ?-MRI bilaterally shows changes consistent with acute early osteomyelitis ?-Discontinue vancomycin and ceftriaxone ?  ?Atrial fibrillation with rapid ventricular response ([place]) ?-Rate controlled on metoprolol 50 daily -May transition to calcium channel blocker given borderline soft blood pressure today ?-Continue Eliquis ?  ?Chronic diastolic CHF (congestive heart failure) ([place]) ?-Last echo 10/2020, EF 55 to 60%.  No appreciable lower extremity swelling.  CT showing small bilateral pleural effusions with interval increase, small ascites, also suggesting possible anasarca.  ?-Volume management per HD ?  ?Diabetes mellitus ([place]) ?- SSI-S ?-Hold NPH 5 units twice daily ?  ?ESRD  ?-Follow-up with nephrology for Tuesday Thursday Saturday dialysis ? ?Essential hypertension ?Stable. ?-Resume metoprolol ?  ?DVT prophylaxis:  Eliquis ?Code Status: Full ?Family Communication: None present  ? ?Status is: Inpatient ? ?Dispo: The patient is from:  Home ?             Anticipated d/c is to: To be determined ?             Anticipated d/c date is: 24 - 48 hours ?             Patient currently not medically stable for discharge ? ?Consultants:  ?None ? ?Procedures:  ?None ? ?Antimicrobials:  ?Vancomycin, ceftriaxone ? ?Subjective: ?No acute issues or events overnight denies nausea vomiting diarrhea constipation headache fevers chills or chest pain ? ?Objective: ?Vitals:  ? 10/15/21 0400 10/15/21 0500 10/15/21 0514 10/15/21 0600  ?BP: (!) 107/58 (!) 100/55  (!) 103/53  ?Pulse: 70 73  95  ?Resp: (!) '21 16  16  '$ ?Temp:   97.7 ?F (36.5 ?C)   ?TempSrc:   Oral   ?SpO2: 96% 97%  96%  ?Weight:      ?Height:      ? ? ?Intake/Output Summary (Last 24 hours) at 10/15/2021 0741 ?Last data filed at 10/15/2021 0600 ?Gross per 24 hour  ?Intake 1091.01 ml  ?Output 3200 ml  ?Net -2108.99 ml  ? ? ?Filed Weights  ? 10/14/21 0420 10/14/21 1500 10/14/21 1930  ?Weight: 116.5 kg 116.5 kg 113.3 kg  ? ? ?Examination: ? ?General exam: Appears calm and comfortable  ?Respiratory system: Clear to auscultation. Respiratory effort normal. ?Cardiovascular system: irregularly irregular ?Gastrointestinal system: Abdomen is nondistended, soft and nontender. No organomegaly or masses felt. Normal bowel sounds heard. ?Central nervous system: Alert and oriented. No focal neurological deficits. ?Extremities: Symmetric 5 x 5 power. ?Skin: bilateral ankle deep cavitary wounds with noted purulence/drainage ? ? ?Data Reviewed: I have personally reviewed following labs and imaging studies ? ?CBC: ?Recent Labs  ?  Lab 10/12/21 ?1327 10/13/21 ?0404 10/14/21 ?1524  ?WBC 7.9 7.7 7.8  ?NEUTROABS 6.2  --   --   ?HGB 10.0* 9.3* 8.9*  ?HCT 30.5* 29.4* 26.0*  ?MCV 84.0 84.7 81.5  ?PLT 315 288 254  ? ? ?Basic Metabolic Panel: ?Recent Labs  ?Lab 10/12/21 ?1327 10/13/21 ?0404 10/14/21 ?1524  ?NA 135 132* 129*  ?K 4.2 4.3 4.8  ?CL 97* 99 95*  ?CO2 '27 22 22  '$ ?GLUCOSE 208* 151* 175*  ?BUN 58* 61* 75*  ?CREATININE 4.74* 4.73*  6.02*  ?CALCIUM 8.1* 7.9* 7.9*  ?PHOS  --   --  6.0*  ? ? ?GFR: ?Estimated Creatinine Clearance: 15.5 mL/min (A) (by C-G formula based on SCr of 6.02 mg/dL (H)). ?Liver Function Tests: ?Recent Labs  ?Lab 10/12/21 ?1327 10/14/21 ?1524  ?AST 35  --   ?ALT 28  --   ?ALKPHOS 211*  --   ?BILITOT 1.2  --   ?PROT 6.9  --   ?ALBUMIN 2.2* 1.7*  ? ? ?No results for input(s): LIPASE, AMYLASE in the last 168 hours. ?No results for input(s): AMMONIA in the last 168 hours. ?Coagulation Profile: ?Recent Labs  ?Lab 10/12/21 ?1327  ?INR 2.1*  ? ? ?Cardiac Enzymes: ?No results for input(s): CKTOTAL, CKMB, CKMBINDEX, TROPONINI in the last 168 hours. ?BNP (last 3 results) ?No results for input(s): PROBNP in the last 8760 hours. ?HbA1C: ?No results for input(s): HGBA1C in the last 72 hours. ?CBG: ?Recent Labs  ?Lab 10/13/21 ?1726 10/13/21 ?2107 10/14/21 ?0730 10/14/21 ?1121 10/14/21 ?2128  ?GLUCAP 147* [place]  ? ? ?Lipid Profile: ?No results for input(s): CHOL, HDL, LDLCALC, TRIG, CHOLHDL, LDLDIRECT in the last 72 hours. ?Thyroid Function Tests: ?No results for input(s): TSH, T4TOTAL, FREET4, T3FREE, THYROIDAB in the last 72 hours. ?Anemia Panel: ?No results for input(s): VITAMINB12, FOLATE, FERRITIN, TIBC, IRON, RETICCTPCT in the last 72 hours. ?Sepsis Labs: ?Recent Labs  ?Lab 10/12/21 ?1327  ?LATICACIDVEN 1.7  ? ? ? ?Recent Results (from the past 240 hour(s))  ?Blood Culture (routine x 2)     Status: None (Preliminary result)  ? Collection Time: 10/12/21  1:29 PM  ? Specimen: BLOOD  ?Result Value Ref Range Status  ? Specimen Description BLOOD BLOOD RIGHT ARM  Final  ? Special Requests   Final  ?  BOTTLES DRAWN AEROBIC AND ANAEROBIC Blood Culture results may not be optimal due to an excessive volume of blood received in culture bottles  ? Culture   Final  ?  NO GROWTH 2 DAYS ?Performed at [facility], [address], [place], [place] [address] ?  ? Report Status PENDING  Incomplete  ?Blood Culture (routine x 2)     Status:  None (Preliminary result)  ? Collection Time: 10/12/21  1:29 PM  ? Specimen: BLOOD  ?Result Value Ref Range Status  ? Specimen Description BLOOD BLOOD RIGHT HAND  Final  ? Special Requests   Final  ?  BOTTLES DRAWN AEROBIC AND ANAEROBIC Blood Culture adequate volume  ? Culture   Final  ?  NO GROWTH 2 DAYS ?Performed at [facility], [address] [place], [place] [address] ?  ? Report Status PENDING  Incomplete  ?Resp Panel by RT-PCR (Flu A&B, Covid) Nasopharyngeal Swab     Status: None  ? Collection Time: 10/12/21  2:12 PM  ? Specimen: Nasopharyngeal Swab; Nasopharyngeal(NP) swabs in vial transport medium  ?Result Value Ref Range Status  ? SARS Coronavirus 2 by RT PCR NEGATIVE NEGATIVE  Final  ?  Comment: (NOTE) ?SARS-CoV-2 target nucleic acids are NOT DETECTED. ? ?The SARS-CoV-2 RNA is generally detectable in upper respiratory ?specimens during the acute phase of infection. The lowest ?concentration of SARS-CoV-2 viral copies this assay can detect is ?138 copies/mL. A negative result does not preclude SARS-Cov-2 ?infection and should not be used as the sole basis for treatment or ?other patient management decisions. A negative result may occur with  ?improper specimen collection/handling, submission of specimen other ?than nasopharyngeal swab, presence of viral mutation(s) within the ?areas targeted by this assay, and inadequate number of viral ?copies(<138 copies/mL). A negative result must be combined with ?clinical observations, patient history, and epidemiological ?information. The expected result is Negative. ? ?Fact Sheet for Patients:  ?EntrepreneurPulse.com.au ? ?Fact Sheet for Healthcare Providers:  ?IncredibleEmployment.be ? ?This test is no t yet approved or cleared by the [place] FDA and  ?has been authorized for detection and/or diagnosis of SARS-CoV-2 by ?FDA under an Emergency Use Authorization (EUA). This EUA will remain  ?in effect (meaning this test can be  used) for the duration of the ?COVID-19 declaration under Section 564(b)(1) of the Act, 21 ?U.S.C.section 360bbb-3(b)(1), unless the authorization is terminated  ?or revoked sooner.  ? ? ?  ? Influenza A b

## 2021-10-16 DIAGNOSIS — I4891 Unspecified atrial fibrillation: Secondary | ICD-10-CM | POA: Diagnosis not present

## 2021-10-16 LAB — GLUCOSE, CAPILLARY
Glucose-Capillary: 113 mg/dL — ABNORMAL HIGH (ref 70–99)
Glucose-Capillary: 175 mg/dL — ABNORMAL HIGH (ref 70–99)
Glucose-Capillary: 187 mg/dL — ABNORMAL HIGH (ref 70–99)
Glucose-Capillary: 168 mg/dL — ABNORMAL HIGH (ref 70–99)

## 2021-10-16 LAB — HEPATITIS B SURFACE ANTIBODY, QUANTITATIVE: Hep B S AB Quant (Post): 7.4 m[IU]/mL — ABNORMAL LOW

## 2021-10-16 MED ORDER — CHLORHEXIDINE GLUCONATE CLOTH 2 % EX PADS
6.0000 | MEDICATED_PAD | Freq: Every day | CUTANEOUS | Status: DC
  Administered 2021-10-17: 6 via TOPICAL

## 2021-10-16 NOTE — Progress Notes (Signed)
?PROGRESS NOTE ? ? ? ?Danny [name]  LOV:[id number] DOB: [birth year] DOA: 10/12/2021 ?PCP: Celene Squibb, MD ? ? ?Brief Narrative:  ?Danny [name] is a 66 y.o. male with medical history significant for hypertension, diabetes mellitus, ESRD Tuesday Thursday Saturday, atrial fibrillation. Patient was brought to the ED reports of left knee pain and increasing weakness over the past 2 days.   ? ?Assessment & Plan: ?  ?Principal Problem: ?  Atrial fibrillation with rapid ventricular response ([place]) ?Active Problems: ?  Decubitus ulcers ?  Essential hypertension ?  Diabetes mellitus ([place]) ?  Chronic diastolic CHF (congestive heart failure) ([place]) ?  End stage renal disease ([place]) ? ?Bilateral decubitus malleoli ulcers,  ?Acute early osteomyelitis likely, POA ?-Patient refusing further discussion about amputation or debridement ?-Discussed case with infectious disease, okay to transition to doxycycline and cephalexin in the interim and follow clinically; if tolerated well will continue at discharge ?-MRI bilaterally shows changes consistent with acute early osteomyelitis ?-Discontinue vancomycin and ceftriaxone ?  ?Atrial fibrillation with rapid ventricular response ([place]) ?-Rate controlled on metoprolol 50 daily -May transition to calcium channel blocker given borderline soft blood pressure today ?-Continue Eliquis ?  ?Chronic diastolic CHF (congestive heart failure) ([place]) ?-Last echo 10/2020, EF 55 to 60%.  No appreciable lower extremity swelling.  CT showing small bilateral pleural effusions with interval increase, small ascites, also suggesting possible anasarca.  ?-Volume management per HD ?  ?Diabetes mellitus ([place]) ?- SSI-S; hypoglycemic protocol ?-Hold NPH 5 units twice daily ?  ?ESRD  ?-Follow-up with nephrology for Tuesday Thursday Saturday dialysis ? ?Essential hypertension ?Stable. ?-Resume metoprolol ?  ?DVT prophylaxis:  Eliquis ?Code Status: Full ?Family Communication: None present  ? ?Status is: Inpatient ? ?Dispo:  The patient is from: Facility ?             Anticipated d/c is to: Same ?             Anticipated d/c date is: 24 hours ?             Patient currently is medically stable for discharge -  ? ?Consultants:  ?None ? ?Procedures:  ?None ? ?Antimicrobials:  ?Doxycyclin/cephalexin ? ?Subjective: ?No acute issues or events overnight denies nausea vomiting diarrhea constipation headache fevers chills or chest pain ? ?Objective: ?Vitals:  ? 10/16/21 0059 10/16/21 0100 10/16/21 0332 10/16/21 0533  ?BP: (!) 97/54 (!) 99/56 123/75 119/81  ?Pulse: (!) 109 (!) 111 (!) 113 (!) 116  ?Resp: '18 18 18 18  '$ ?Temp: 98.2 ?F (36.8 ?C) 98.2 ?F (36.8 ?C) 98.2 ?F (36.8 ?C) 97.7 ?F (36.5 ?C)  ?TempSrc: Oral Oral Oral Oral  ?SpO2: 97% 97% 98% 97%  ?Weight:      ?Height:      ? ?No intake or output data in the 24 hours ending 10/16/21 0722 ? ?Filed Weights  ? 10/14/21 1500 10/14/21 1930 10/15/21 1829  ?Weight: 116.5 kg 113.3 kg 119.5 kg  ? ? ?Examination: ? ?General exam: Appears calm and comfortable  ?Respiratory system: Clear to auscultation. Respiratory effort normal. ?Cardiovascular system: irregularly irregular ?Gastrointestinal system: Abdomen is nondistended, soft and nontender. No organomegaly or masses felt. Normal bowel sounds heard. ?Central nervous system: Alert and oriented. No focal neurological deficits. ?Extremities: Symmetric 5 x 5 power. ?Skin: bilateral ankle deep cavitary wounds with noted purulence/drainage ? ? ?Data Reviewed: I have personally reviewed following labs and imaging studies ? ?CBC: ?Recent Labs  ?Lab 10/12/21 ?1327 10/13/21 ?0404 10/14/21 ?1524  ?WBC 7.9 7.7  7.8  ?NEUTROABS 6.2  --   --   ?HGB 10.0* 9.3* 8.9*  ?HCT 30.5* 29.4* 26.0*  ?MCV 84.0 84.7 81.5  ?PLT 315 288 254  ? ? ?Basic Metabolic Panel: ?Recent Labs  ?Lab 10/12/21 ?1327 10/13/21 ?0404 10/14/21 ?1524  ?NA 135 132* 129*  ?K 4.2 4.3 4.8  ?CL 97* 99 95*  ?CO2 '27 22 22  '$ ?GLUCOSE 208* 151* 175*  ?BUN 58* 61* 75*  ?CREATININE 4.74* 4.73* 6.02*  ?CALCIUM  8.1* 7.9* 7.9*  ?PHOS  --   --  6.0*  ? ? ?GFR: ?Estimated Creatinine Clearance: 15.9 mL/min (A) (by C-G formula based on SCr of 6.02 mg/dL (H)). ?Liver Function Tests: ?Recent Labs  ?Lab 10/12/21 ?1327 10/14/21 ?1524  ?AST 35  --   ?ALT 28  --   ?ALKPHOS 211*  --   ?BILITOT 1.2  --   ?PROT 6.9  --   ?ALBUMIN 2.2* 1.7*  ? ? ?No results for input(s): LIPASE, AMYLASE in the last 168 hours. ?No results for input(s): AMMONIA in the last 168 hours. ?Coagulation Profile: ?Recent Labs  ?Lab 10/12/21 ?1327  ?INR 2.1*  ? ? ?Cardiac Enzymes: ?No results for input(s): CKTOTAL, CKMB, CKMBINDEX, TROPONINI in the last 168 hours. ?BNP (last 3 results) ?No results for input(s): PROBNP in the last 8760 hours. ?HbA1C: ?No results for input(s): HGBA1C in the last 72 hours. ?CBG: ?Recent Labs  ?Lab 10/14/21 ?2128 10/15/21 ?0751 10/15/21 ?1215 10/15/21 ?1628 10/15/21 ?2031  ?GLUCAP 91 154* 149* 175* 179*  ? ? ?Lipid Profile: ?No results for input(s): CHOL, HDL, LDLCALC, TRIG, CHOLHDL, LDLDIRECT in the last 72 hours. ?Thyroid Function Tests: ?No results for input(s): TSH, T4TOTAL, FREET4, T3FREE, THYROIDAB in the last 72 hours. ?Anemia Panel: ?No results for input(s): VITAMINB12, FOLATE, FERRITIN, TIBC, IRON, RETICCTPCT in the last 72 hours. ?Sepsis Labs: ?Recent Labs  ?Lab 10/12/21 ?1327  ?LATICACIDVEN 1.7  ? ? ? ?Recent Results (from the past 240 hour(s))  ?Blood Culture (routine x 2)     Status: None (Preliminary result)  ? Collection Time: 10/12/21  1:29 PM  ? Specimen: BLOOD  ?Result Value Ref Range Status  ? Specimen Description BLOOD BLOOD RIGHT ARM  Final  ? Special Requests   Final  ?  BOTTLES DRAWN AEROBIC AND ANAEROBIC Blood Culture results may not be optimal due to an excessive volume of blood received in culture bottles  ? Culture   Final  ?  NO GROWTH 4 DAYS ?Performed at [facility], [address], [place], [place] [address] ?  ? Report Status PENDING  Incomplete  ?Blood Culture (routine x 2)     Status: None (Preliminary  result)  ? Collection Time: 10/12/21  1:29 PM  ? Specimen: BLOOD  ?Result Value Ref Range Status  ? Specimen Description BLOOD BLOOD RIGHT HAND  Final  ? Special Requests   Final  ?  BOTTLES DRAWN AEROBIC AND ANAEROBIC Blood Culture adequate volume  ? Culture   Final  ?  NO GROWTH 4 DAYS ?Performed at [facility], [address], [place], [place] [address] ?  ? Report Status PENDING  Incomplete  ?Resp Panel by RT-PCR (Flu A&B, Covid) Nasopharyngeal Swab     Status: None  ? Collection Time: 10/12/21  2:12 PM  ? Specimen: Nasopharyngeal Swab; Nasopharyngeal(NP) swabs in vial transport medium  ?Result Value Ref Range Status  ? SARS Coronavirus 2 by RT PCR NEGATIVE NEGATIVE Final  ?  Comment: (NOTE) ?SARS-CoV-2 target nucleic acids are  NOT DETECTED. ? ?The SARS-CoV-2 RNA is generally detectable in upper respiratory ?specimens during the acute phase of infection. The lowest ?concentration of SARS-CoV-2 viral copies this assay can detect is ?138 copies/mL. A negative result does not preclude SARS-Cov-2 ?infection and should not be used as the sole basis for treatment or ?other patient management decisions. A negative result may occur with  ?improper specimen collection/handling, submission of specimen other ?than nasopharyngeal swab, presence of viral mutation(s) within the ?areas targeted by this assay, and inadequate number of viral ?copies(<138 copies/mL). A negative result must be combined with ?clinical observations, patient history, and epidemiological ?information. The expected result is Negative. ? ?Fact Sheet for Patients:  ?EntrepreneurPulse.com.au ? ?Fact Sheet for Healthcare Providers:  ?IncredibleEmployment.be ? ?This test is no t yet approved or cleared by the [place] FDA and  ?has been authorized for detection and/or diagnosis of SARS-CoV-2 by ?FDA under an Emergency Use Authorization (EUA). This EUA will remain  ?in effect (meaning this test can be used) for the  duration of the ?COVID-19 declaration under Section 564(b)(1) of the Act, 21 ?U.S.C.section 360bbb-3(b)(1), unless the authorization is terminated  ?or revoked sooner.  ? ? ?  ? Influenza A by PCR NEGATI

## 2021-10-16 NOTE — [place] FL2 (Signed)
?[place] MEDICAID FL2 LEVEL OF CARE SCREENING TOOL  ?  ? ?IDENTIFICATION  ?Patient Name: ?Danny [name] Birthdate: 11/12/1955 Sex: male Admission Date (Current Location): ?10/12/2021  ?[place] and [place] Number: ? [place] and Address:  ?[facility] [address], [place] ?     Provider Number: ?[id number]  ?Attending Physician Name and Address:  ?Little Ishikawa, MD ? Relative Name and Phone Number:  ?  ?   ?Current Level of Care: ?Hospital Recommended Level of Care: ?[place] Prior Approval Number: ?  ? ?Date Approved/Denied: ?  PASRR Number: ?[id number] A ? ?Discharge Plan: ?SNF ?  ? ?Current Diagnoses: ?Patient Active Problem List  ? Diagnosis Date Noted  ? Atrial fibrillation with rapid ventricular response ([place]) 10/12/2021  ? Atrial fibrillation ([place]) 08/12/2021  ? Type 2 diabetes mellitus with hyperlipidemia ([place]) 08/12/2021  ? Class 1 obesity 08/12/2021  ? Iron deficiency anemia 04/21/2021  ? Goals of care, counseling/discussion 01/20/2021  ? Acute metabolic encephalopathy 75/64/3329  ? End stage renal disease ([place])   ? Symptomatic anemia 01/12/2021  ? B12 deficiency 12/06/2020  ? Osteomyelitis of right ankle ([place])   ? Cellulitis, leg 10/28/2020  ? CKD (chronic kidney disease) stage 4, GFR 15-29 ml/min (HCC) 10/28/2020  ? Moderate protein-calorie malnutrition ([place]) 10/26/2020  ? Medical non-compliance 10/26/2020  ? Chronic ulcer of right ankle ([place])   ? Venous stasis   ? Cellulitis 10/25/2020  ? Normocytic anemia 09/13/2020  ? Class 2 obesity   ? Respiratory failure ([place]) 03/23/2020  ? UTI (urinary tract infection), bacterial 01/22/2020  ? Acute on chronic anemia 01/16/2020  ? Benign prostatic hyperplasia with urinary obstruction 01/15/2020  ? AKI (acute kidney injury) ([place]) 07/08/2019  ? Hypoxia 07/08/2019  ? Decubitus ulcers 07/08/2019  ? Edema of both lower extremities   ? Anasarca 03/23/2019  ? Bladder outlet obstruction 03/23/2019  ? Yeast  infection of the skin 03/16/2019  ? Diabetic ulcer of left foot ([place]) 03/15/2019  ? Diabetic ulcer of ankle ([place]) 03/15/2019  ? Hypertension associated with stage 3 chronic kidney disease due to type 2 diabetes mellitus ([place]) 03/09/2019  ? Controlled type 2 diabetes mellitus with stage 3 chronic kidney disease, with long-term current use of insulin ([place]) 03/09/2019  ? Type 2 diabetes with nephropathy ([place]) 03/09/2019  ? Chronic gout due to renal impairment without tophus 03/09/2019  ? Emphysematous cystitis 03/05/2019  ? Bilateral hydronephrosis   ? Bilateral cellulitis of lower leg 03/03/2019  ? Urinary retention 03/03/2019  ? Klebsiella Cystitis 03/03/2019  ? UTI due to Klebsiella species 03/03/2019  ? Plantar ulcer of left foot ([place]) 03/03/2019  ? Chronic diastolic CHF (congestive heart failure) ([place]) 02/25/2019  ? Acute respiratory failure with hypoxia ([place]) 02/25/2019  ? Acute renal failure superimposed on stage 4 chronic kidney disease ([place]) 02/24/2019  ? Congestive heart failure ([place]) 02/24/2019  ? Dyspnea 02/23/2019  ? Essential hypertension 02/23/2019  ? Diabetes mellitus ([place]) 02/23/2019  ? CKD (chronic kidney disease) 02/23/2019  ? Psoriasis 02/23/2019  ? ? ?Orientation RESPIRATION BLADDER Height & Weight   ?  ?Self, Time, Place, Situation ? Normal Incontinent Weight: 263 lb 7.2 oz (119.5 kg) ?Height:  '6\' 1"'$  (185.4 cm)  ?BEHAVIORAL SYMPTOMS/MOOD NEUROLOGICAL BOWEL NUTRITION STATUS  ?    Continent Diet  ?AMBULATORY STATUS COMMUNICATION OF NEEDS Skin   ?Extensive Assist Verbally Other (Comment) (Venous stasis ulcer ankle anterior left and ankle right) ?  ?  ?  ?    ?     ?     ? ? ?  Personal Care Assistance Level of Assistance  ?Bathing, Feeding, Dressing Bathing Assistance: Maximum assistance ?Feeding assistance: Independent ?Dressing Assistance: Maximum assistance ?   ? ?Functional Limitations Info  ?Speech, Hearing, Sight Sight Info: Adequate ?Hearing Info: Adequate ?Speech Info: Adequate  ? ? ?SPECIAL  CARE FACTORS FREQUENCY  ?PT (By licensed PT), OT (By licensed OT)   ?  ?PT Frequency: 3x weekly ?OT Frequency: 3x weekly ?  ?  ?  ?   ? ? ?Contractures Contractures Info: Not present  ? ? ?Additional Factors Info  ?Allergies, Code Status Code Status Info: Full Code ?Allergies Info: Dust Mite Extract, Prednisone, Rocephin (Ceftriaxone) ?  ?  ?  ?   ? ?Current Medications (10/16/2021):  This is the current hospital active medication list ?Current Facility-Administered Medications  ?Medication Dose Route Frequency Provider Last Rate Last Admin  ? 0.9 %  sodium chloride infusion  100 mL Intravenous PRN Donato Heinz, MD      ? 0.9 %  sodium chloride infusion  100 mL Intravenous PRN Donato Heinz, MD      ? acetaminophen (TYLENOL) tablet 650 mg  650 mg Oral Q6H PRN Emokpae, Ejiroghene E, MD   650 mg at 10/15/21 1945  ? Or  ? acetaminophen (TYLENOL) suppository 650 mg  650 mg Rectal Q6H PRN Emokpae, Ejiroghene E, MD      ? albumin human 25 % solution 25 g  25 g Intravenous PRN Donato Heinz, MD 60 mL/hr at 10/14/21 1746 25 g at 10/14/21 1746  ? alteplase (CATHFLO ACTIVASE) injection 2 mg  2 mg Intracatheter Once PRN Donato Heinz, MD      ? apixaban (ELIQUIS) tablet 5 mg  5 mg Oral BID Emokpae, Ejiroghene E, MD   5 mg at 10/16/21 0831  ? cephALEXin (KEFLEX) capsule 250 mg  250 mg Oral Q8H Little Ishikawa, MD   250 mg at 10/16/21 0831  ? Chlorhexidine Gluconate Cloth 2 % PADS 6 each  6 each Topical Daily Adefeso, [place], DO   6 each at 10/16/21 0834  ? Chlorhexidine Gluconate Cloth 2 % PADS 6 each  6 each Topical Q0600 Donato Heinz, MD   6 each at 10/16/21 0631  ? Darbepoetin Alfa (ARANESP) injection 100 mcg  100 mcg Intravenous Q Pearla Dubonnet, MD   100 mcg at 10/14/21 1615  ? doxycycline (VIBRA-TABS) tablet 100 mg  100 mg Oral Q12H Little Ishikawa, MD   100 mg at 10/16/21 0831  ? heparin injection 1,000 Units  1,000 Units Dialysis PRN Donato Heinz, MD      ? insulin  aspart (novoLOG) injection 0-5 Units  0-5 Units Subcutaneous QHS Emokpae, Ejiroghene E, MD      ? insulin aspart (novoLOG) injection 0-9 Units  0-9 Units Subcutaneous TID WC Emokpae, Ejiroghene E, MD   2 Units at 10/15/21 1817  ? lidocaine (PF) (XYLOCAINE) 1 % injection 5 mL  5 mL Intradermal PRN Donato Heinz, MD      ? lidocaine-prilocaine (EMLA) cream 1 application.  1 application. Topical PRN Donato Heinz, MD      ? metoprolol succinate (TOPROL-XL) 24 hr tablet 50 mg  50 mg Oral BID Emokpae, Ejiroghene E, MD   50 mg at 10/16/21 0831  ? midodrine (PROAMATINE) tablet 10 mg  10 mg Oral Q T,Th,Sa-HD Emokpae, Ejiroghene E, MD   10 mg at 10/15/21 1227  ? ondansetron (ZOFRAN) tablet 4 mg  4 mg Oral Q6H PRN Emokpae, Ejiroghene E, MD      ?  Or  ? ondansetron (ZOFRAN) injection 4 mg  4 mg Intravenous Q6H PRN Emokpae, Ejiroghene E, MD      ? oxyCODONE (Oxy IR/ROXICODONE) immediate release tablet 5 mg  5 mg Oral Q8H PRN Emokpae, Ejiroghene E, MD   5 mg at 10/13/21 0937  ? pentafluoroprop-tetrafluoroeth (GEBAUERS) aerosol 1 application.  1 application. Topical PRN Donato Heinz, MD      ? polyethylene glycol (MIRALAX / GLYCOLAX) packet 17 g  17 g Oral Daily PRN Emokpae, Ejiroghene E, MD      ? sevelamer carbonate (RENVELA) tablet 800 mg  800 mg Oral BID WC Emokpae, Ejiroghene E, MD   800 mg at 10/16/21 0831  ? tamsulosin (FLOMAX) capsule 0.4 mg  0.4 mg Oral QPC supper Emokpae, Ejiroghene E, MD   0.4 mg at 10/15/21 1825  ? ? ? ?Discharge Medications: ?Please see discharge summary for a list of discharge medications. ? ?Relevant Imaging Results: ? ?Relevant Lab Results: ? ? ?Additional Information ?SSN: [id number] ? ?Archie Endo, LCSW ? ? ? ? ?

## 2021-10-16 NOTE — Progress Notes (Signed)
CSW spoke with [name] at [facility] who confirms patient can return to the facility. CSW completed new FL2 and sent it to [facility] for review. ? ?CSW unable to initiate insurance authorization due to [facility] portal error messages. Patient will require insurance authorization prior to discharge. ? ?Madilyn Fireman, MSW, LCSW ?Transitions of Care  Clinical Social Worker II ?[phone] ? ?

## 2021-10-16 NOTE — Plan of Care (Signed)
?  Problem: Acute Rehab PT Goals(only PT should resolve) ?Goal: Pt Will Go Supine/Side To Sit ?Flowsheets (Taken 10/16/2021 1013) ?Pt will go Supine/Side to Sit: with minimal assist ?Goal: Pt Will Go Sit To Supine/Side ?Flowsheets (Taken 10/16/2021 1013) ?Pt will go Sit to Supine/Side: with minimal assist ?Goal: Pt/caregiver will Perform Home Exercise Program ?Flowsheets (Taken 10/16/2021 1013) ?Pt/caregiver will Perform Home Exercise Program: ? For increased strengthening ? For increased ROM ?  ?

## 2021-10-16 NOTE — Evaluation (Signed)
Physical Therapy Evaluation ?Patient Details ?Name: Danny [name] ?MRN: [id number] ?DOB: [birth year] ?Today's Date: 10/16/2021 ? ?History of Present Illness ? Danny [name] is a 66 y.o. male with medical history significant for hypertension, diabetes mellitus, ESRD Tuesday Thursday Saturday, atrial fibrillation. Patient was brought to the ED reports of left knee pain and increasing weakness over the past 2 days.Decubitus ulcers    Essential hypertension    Diabetes mellitus (HCC)    Chronic diastolic CHF (congestive heart failure) ([place])    End stage renal disease ([place]  MRI  consistant with early osteomyelitis.  ?Clinical Impression ? PT was able to I transfer from bed to w/c using sliding board, he is no longer able to do this and will benefit from skilled care to return him to his prior functioning level.    ?   ? ?Recommendations for follow up therapy are one component of a multi-disciplinary discharge planning process, led by the attending physician.  Recommendations may be updated based on patient status, additional functional criteria and insurance authorization. ? ?Follow Up Recommendations Skilled nursing-short term rehab (<3 hours/day) ? ?  ?Assistance Recommended at Discharge Intermittent Supervision/Assistance  ?Patient can return home with the following ? A lot of help with walking and/or transfers ? ?  ?Equipment Recommendations None recommended by PT  ?Recommendations for Other Services ? OT consult  ?  ?Functional Status Assessment Patient has had a recent decline in their functional status and demonstrates the ability to make significant improvements in function in a reasonable and predictable amount of time.  ? ?  ?Precautions / Restrictions Precautions ?Precautions: Fall ?Precaution Comments: cellulitis ?Restrictions ?Weight Bearing Restrictions: No  ? ?  ? ?Mobility ? Bed Mobility ?Overal bed mobility: Needs Assistance ?Bed Mobility: Supine to Sit, Sit to Supine ?  ?  ?Supine to sit: Mod assist ?Sit to  supine: Mod assist ?  ?  ?  ?  ?  ?  ?  ?  ?  ?  ?  ?General transfer comment: PT has not been able to walk for months he was able to I transfer with a transfer board 2 weeks ago ?  ? ? ?  ? ?  ? ? ? ? ?Pertinent Vitals/Pain Pain Assessment ?Pain Assessment: 0-10 ?Pain Score: 1  ?Pain Location: Lt knee ?Pain Descriptors / Indicators: Aching ?Pain Intervention(s): Limited activity within patient's tolerance  ? ? ?Home Living Family/patient expects to be discharged to:: Skilled nursing facility ?  ?  ?  ?  ?  ?  ?  ?  ?  ?   ?  ?   ? ? ?Extremity/Trunk Assessment  ? Upper Extremity Assessment ?Upper Extremity Assessment: Generalized weakness ?  ? ?Lower Extremity Assessment ?Lower Extremity Assessment: Generalized weakness ?  ? ?   ?Communication  ?    ?Cognition Arousal/Alertness: Awake/alert ?Behavior During Therapy: [facility] for tasks assessed/performed ?Overall Cognitive Status: Within Functional Limits for tasks assessed ?  ?  ?  ?  ?  ?  ?  ?  ?  ?  ?  ?  ?  ?  ?  ?  ?  ?  ?  ? ?  ?General Comments   ? ?  ?Exercises General Exercises - Lower Extremity ?Ankle Circles/Pumps: Both, 10 reps ?Quad Sets: Both, 10 reps ?Long Arc Quad: Both, 10 reps ?Heel Slides: Both, 5 reps ?Hip ABduction/ADduction: Both, 5 reps ?Straight Leg Raises: Both, 5 reps, AAROM  ? ?Assessment/Plan  ?  ?PT Assessment  Patient needs continued PT services  ?PT Problem List Decreased strength;Decreased range of motion;Decreased mobility ? ?   ?  ?PT Treatment Interventions Therapeutic exercise;Therapeutic activities;Functional mobility training   ? ?PT Goals (Current goals can be found in the Care Plan section)  ?Acute Rehab PT Goals ?PT Goal Formulation: With patient ?Time For Goal Achievement: 10/20/21 ?Potential to Achieve Goals: Good ? ?  ?Frequency Min 3X/week ?  ? ? ?Co-evaluation   ?  ?  ?  ?  ? ? ?  ?AM-PAC PT "6 Clicks" Mobility  ?Outcome Measure Help needed turning from your back to your side while in a flat bed without using bedrails?: A  Little ?Help needed moving from lying on your back to sitting on the side of a flat bed without using bedrails?: A Lot ?Help needed moving to and from a bed to a chair (including a wheelchair)?: Total ?Help needed standing up from a chair using your arms (e.g., wheelchair or bedside chair)?: Total ?Help needed to walk in hospital room?: Total ?Help needed climbing 3-5 steps with a railing? : Total ?6 Click Score: 9 ? ?  ?End of Session   ?Activity Tolerance: Patient tolerated treatment well ?Patient left: in bed;with call bell/phone within reach ?Nurse Communication: Mobility status ?PT Visit Diagnosis: Muscle weakness (generalized) (M62.81) ?  ? ?Time: 0920-1015 ?PT Time Calculation (min) (ACUTE ONLY): 55 min ? ? ?Charges:   PT Evaluation ?$PT Eval Low Complexity: 1 Low ?PT Treatments ?$Therapeutic Exercise: 8-22 mins ?  ?   ? ? ?Rayetta Humphrey, PT CLT ?[phone]  ?10/16/2021, 10:13 AM ? ?

## 2021-10-17 ENCOUNTER — Ambulatory Visit (INDEPENDENT_AMBULATORY_CARE_PROVIDER_SITE_OTHER): Payer: Medicare Other | Admitting: Gastroenterology

## 2021-10-17 DIAGNOSIS — I4891 Unspecified atrial fibrillation: Secondary | ICD-10-CM | POA: Diagnosis not present

## 2021-10-17 LAB — GLUCOSE, CAPILLARY
Glucose-Capillary: 132 mg/dL — ABNORMAL HIGH (ref 70–99)
Glucose-Capillary: 171 mg/dL — ABNORMAL HIGH (ref 70–99)
Glucose-Capillary: 205 mg/dL — ABNORMAL HIGH (ref 70–99)

## 2021-10-17 MED ORDER — DOXYCYCLINE HYCLATE 100 MG PO TABS: 100.0000 mg | ORAL_TABLET | Freq: Two times a day (BID) | ORAL | 0 refills | Status: AC

## 2021-10-17 MED ORDER — CEPHALEXIN 250 MG PO CAPS: 250.0000 mg | ORAL_CAPSULE | Freq: Three times a day (TID) | ORAL | 0 refills | Status: AC

## 2021-10-17 MED ORDER — OXYCODONE HCL 5 MG PO CAPS: 5.0000 mg | ORAL_CAPSULE | Freq: Four times a day (QID) | ORAL | 0 refills | Status: DC | PRN

## 2021-10-17 NOTE — Discharge Summary (Signed)
Physician Discharge Summary  ?TYNER CODNER EXH:[id number] DOB: 04-01-1956 DOA: 10/12/2021 ? ?PCP: Celene Squibb, MD ? ?Admit date: 10/12/2021 ?Discharge date: 10/17/2021 ? ?Admitted From: [facility] ?Disposition: Same ? ?Recommendations for Outpatient Follow-up:  ?Follow up with PCP in 1-2 weeks ?Please obtain BMP/CBC in one week ? ?Discharge Condition: Stable ?CODE STATUS: Full ?Diet recommendation: Low-salt low-fat low-carb renal diet ? ?Brief/Interim Summary: ?Danny [name] is a 66 y.o. male with medical history significant for hypertension, diabetes mellitus, ESRD Tuesday Thursday Saturday, atrial fibrillation. Patient was brought to the ED reports of left knee pain and increasing weakness over the past 2 days. Found to have bilateral ankle osteomyelitis, imaging of the knee reassuringly negative - knee pain likely referred from ankle. ? ?Bilateral decubitus malleoli ulcers,  ?Acute early osteomyelitis likely, POA ?-Patient deferred further discussion on amputation or debridement ?-Discussed case with infectious disease, okay to transition to doxycycline and cephalexin in the interim -we will follow-up with PCP in the next 1 to 2 weeks for reevaluation and possible extension of antibiotics pending clinical course. ?-MRI bilaterally shows changes consistent with acute early osteomyelitis ?  ?Atrial fibrillation with rapid ventricular response ([place]) ?-Rate controlled on metoprolol 50 daily  ?-Continue Eliquis ?  ?Chronic diastolic CHF (congestive heart failure) ([place]) ?-Last echo 10/2020, EF 55 to 60%.  No appreciable lower extremity swelling.  CT showing small bilateral pleural effusions with interval increase, small ascites, also suggesting possible anasarca.  ?-Volume management per HD ?  ?Diabetes mellitus ([place]) uncontrolled with hyperglycemia ?- Resume home insulin regiment ?  ?ESRD  ?-Follow-up with nephrology for Tuesday Thursday Saturday dialysis ?  ?Essential hypertension ?-Well-controlled on current home  regimen no changes during hospitalization ? ?Discharge Diagnoses:  ?Principal Problem: ?  Atrial fibrillation with rapid ventricular response ([place]) ?Active Problems: ?  Decubitus ulcers ?  Essential hypertension ?  Diabetes mellitus ([place]) ?  Chronic diastolic CHF (congestive heart failure) ([place]) ?  End stage renal disease ([place]) ? ? ? ?Discharge Instructions ? ?Discharge Instructions   ? ? Discharge patient   Complete by: As directed ?  ? Discharge disposition: 03-Skilled Nursing Facility  ? Discharge patient date: 10/17/2021  ? ?  ? ?Allergies as of 10/17/2021   ? ?   Reactions  ? Dust Mite Extract Itching, Other (See Comments)  ? Unknown reaction-potential shortness of breath  ? Prednisone Nausea And Vomiting  ? Rocephin [ceftriaxone] Nausea And Vomiting  ? ?  ? ?  ?Medication List  ?  ? ?TAKE these medications   ? ?acetaminophen 500 MG tablet ?Commonly known as: TYLENOL ?Take 1,000 mg by mouth every 6 (six) hours as needed for moderate pain or headache. ?  ?apixaban 5 MG Tabs tablet ?Commonly known as: ELIQUIS ?Take 1 tablet (5 mg total) by mouth 2 (two) times daily. ?  ?B-12 Compliance Injection 1000 MCG/ML Kit ?Generic drug: Cyanocobalamin ?Inject 1,000 mcg as directed every 28 (twenty-eight) days. ?  ?cephALEXin 250 MG capsule ?Commonly known as: KEFLEX ?Take 1 capsule (250 mg total) by mouth every 8 (eight) hours for 14 days. ?  ?cholecalciferol 25 MCG (1000 UNIT) tablet ?Commonly known as: VITAMIN D3 ?Take 1,000 Units by mouth See admin instructions. Monday's, Wednesday's, Friday's, and Sunday's in the morning,and Tuesday's, Thursday's, and Saturday's at bedtime ?  ?docusate sodium 100 MG capsule ?Commonly known as: COLACE ?Take 100 mg by mouth 2 (two) times daily. ?  ?doxycycline 100 MG tablet ?Commonly known as: VIBRA-TABS ?Take 1 tablet (100 mg total)  by mouth every 12 (twelve) hours for 14 days. ?  ?feeding supplement (NEPRO CARB STEADY) Liqd ?Take 237 mLs by mouth 2 (two) times daily between meals. ?   ?ferrous sulfate 325 (65 FE) MG tablet ?Take 325 mg by mouth 2 (two) times daily. ?  ?Fish Oil 500 MG Caps ?Take 500 mg by mouth 2 (two) times daily. ?  ?fluticasone 50 MCG/ACT nasal spray ?Commonly known as: FLONASE ?Place 2 sprays into both nostrils daily. ?  ?folic acid 824 MCG tablet ?Commonly known as: FOLVITE ?Take 1 tablet (400 mcg total) by mouth daily. ?  ?HUMULIN 70/30 KWIKPEN [place] ?Inject 5 Units into the skin 2 (two) times daily. ?  ?Lactulose 20 GM/30ML Soln ?Take 30 mLs (20 g total) by mouth daily. ?What changed: when to take this ?  ?metoprolol succinate 50 MG 24 hr tablet ?Commonly known as: Toprol XL ?Take 1 tablet (50 mg total) by mouth in the morning and at bedtime. Take with or immediately following a meal. ?  ?midodrine 10 MG tablet ?Commonly known as: PROAMATINE ?Take 10 mg by mouth Every Tuesday,Thursday,and Saturday with dialysis. ?  ?multivitamin with minerals Tabs tablet ?Take 1 tablet by mouth daily. ?  ?ondansetron 4 MG tablet ?Commonly known as: ZOFRAN ?Take 4 mg by mouth every 6 (six) hours as needed for nausea or vomiting. ?  ?oxycodone 5 MG capsule ?Commonly known as: OXY-IR ?Take 1 capsule (5 mg total) by mouth every 6 (six) hours as needed (severe pain). ?What changed: reasons to take this ?  ?polyethylene glycol 17 g packet ?Commonly known as: MIRALAX / GLYCOLAX ?Take 17 g by mouth daily. ?  ?PROTEIN PO ?Take 30 mLs by mouth 3 (three) times daily. ?  ?rosuvastatin 5 MG tablet ?Commonly known as: CRESTOR ?Take 5 mg by mouth every evening. ?  ?senna 8.6 MG tablet ?Commonly known as: SENOKOT ?Take 2 tablets by mouth 2 (two) times daily. ?  ?sevelamer carbonate 800 MG tablet ?Commonly known as: RENVELA ?Take 1 tablet (800 mg total) by mouth 2 (two) times daily with a meal. ?  ?sodium phosphate 7-19 GM/118ML Enem ?Place 1 enema rectally daily as needed for severe constipation. ?  ?tamsulosin 0.4 MG Caps capsule ?Commonly known as: FLOMAX ?Take 1 capsule (0.4 mg total) by mouth daily  after supper. ?  ?vitamin C 500 MG tablet ?Commonly known as: ASCORBIC ACID ?Take 1,000 mg by mouth 2 (two) times daily. ?  ?vitamin E 1000 UNIT capsule ?Take 1,000 Units by mouth See admin instructions. Monday's, Wednesday's, Friday's, and Sunday's in the morning, and Tuesday's, Thursday's, and Saturday's at bedtime. ?  ?zinc sulfate 220 (50 Zn) MG capsule ?Take 220 mg by mouth daily. ?  ? ?  ? ? Contact information for after-discharge care   ? ? Destination   ? ? [facility] .   ?Service: Skilled Nursing ?Contact information: ?[place] ?[place] ?[phone] ? ?  ?  ? ?  ?  ? ?  ?  ? ?  ? ?Allergies  ?Allergen Reactions  ? Dust Mite Extract Itching and Other (See Comments)  ?  Unknown reaction-potential shortness of breath  ? Prednisone Nausea And Vomiting  ? Rocephin [Ceftriaxone] Nausea And Vomiting  ? ? ?Consultations: ?Nephrology, infectious disease ? ?Procedures/Studies: ?DG Knee 1-2 Views Left ? ?Result Date: 10/12/2021 ?CLINICAL DATA:  Knee pain. EXAM: LEFT KNEE - 1-2 VIEW COMPARISON:  Left knee x-ray 10/12/2021. FINDINGS: Left knee arthroplasty is again  seen. Alignment is anatomic. There is no evidence for hardware loosening. There is heterotopic ossification surrounding the lateral left knee. there is no acute fracture or dislocation. Joint effusion is likely present. There is some soft tissue swelling surrounding the knee. IMPRESSION: 1. Soft tissue swelling surrounding the knee with joint effusion. 2. No acute bony abnormality. 3. Left knee arthroplasty in anatomic alignment. Electronically Signed   By: Ronney Asters M.D.   On: 10/12/2021 23:59  ? ?DG Ankle 2 Views Left ? ?Result Date: 10/12/2021 ?CLINICAL DATA:  Bilateral ankle ulcers with discharge. EXAM: LEFT ANKLE - 2 VIEW COMPARISON:  None. FINDINGS: There is no evidence of an acute fracture, dislocation, or joint effusion. Moderate severity chronic and degenerative changes are seen  along the dorsal aspect of the proximal to mid left foot. Mild chronic changes are also seen along the medial aspect of the left medial malleolus. There is mild diffuse soft tissue swelling. Moderate severity vascula

## 2021-10-17 NOTE — Procedures (Signed)
Fistula/Graft ? ?Pt in HD suite, consent and orders verified, UF goal 2500 mls including prime/rinse. L AV Fistula bruit/ thrill identified, lungs diminished, abdominal sounds present, pt without generalized edema, NAD. ?  ?Net Uf removed 2000 mls.  HD tx completed with tachycardia, denies palpitations/flutters.  Appears to be stable. Hemostasis achieved, no concerns present. ? ?   ?

## 2021-10-17 NOTE — Progress Notes (Signed)
Patient discharged by EMS to [facility], all belongings sent with patient. ?

## 2021-10-17 NOTE — Progress Notes (Signed)
Report given to [facility] LPN at [facility]  ?

## 2021-10-17 NOTE — Progress Notes (Signed)
Two attempts made to call [facility] for report with no answer, Will try again later.  ?

## 2021-10-17 NOTE — Care Management Important Message (Signed)
Important Message ? ?Patient Details  ?Name: Danny [name] ?MRN: [id number] ?Date of Birth: [birth year] ? ? ?Medicare Important Message Given:  Yes ? ? ? ? ?[name] ?10/17/2021, 12:16 PM ?

## 2021-10-17 NOTE — Progress Notes (Signed)
Pt is in dialysis and will not be done until 1935. Facility aware will pass on in report for night shift to call EMS transport.   ?

## 2021-10-17 NOTE — Progress Notes (Signed)
Patient ID: JAYMESON MENGEL, male   DOB: Jun 23, 1956, 66 y.o.   MRN: [id number] ?S: No complaints. ?O:BP 114/71 (BP Location: Right Arm)   Pulse 72   Temp 98.1 ?F (36.7 ?C) (Oral)   Resp 18   Ht '6\' 1"'$  (1.854 m)   Wt 118.7 kg   SpO2 97%   BMI 34.53 kg/m?  ? ?Intake/Output Summary (Last 24 hours) at 10/17/2021 0906 ?Last data filed at 10/16/2021 1847 ?Gross per 24 hour  ?Intake 480 ml  ?Output 500 ml  ?Net -20 ml  ? ?Intake/Output: ?I/O last 3 completed shifts: ?In: 840 [P.O.:840] ?Out: 500 [Urine:500] ? Intake/Output this shift: ? No intake/output data recorded. ?Weight change: -0.8 kg ?Gen:NAD ?CVS: RRR ?Resp:CTA ?Abd: +BS, soft, NT/ND ?Ext: LUE AVF +T/B, bilateral lower extremities wrapped ? ?Recent Labs  ?Lab 10/12/21 ?1327 10/13/21 ?0404 10/14/21 ?1524  ?NA 135 132* 129*  ?K 4.2 4.3 4.8  ?CL 97* 99 95*  ?CO2 '27 22 22  '$ ?GLUCOSE 208* 151* 175*  ?BUN 58* 61* 75*  ?CREATININE 4.74* 4.73* 6.02*  ?ALBUMIN 2.2*  --  1.7*  ?CALCIUM 8.1* 7.9* 7.9*  ?PHOS  --   --  6.0*  ?AST 35  --   --   ?ALT 28  --   --   ? ?Liver Function Tests: ?Recent Labs  ?Lab 10/12/21 ?1327 10/14/21 ?1524  ?AST 35  --   ?ALT 28  --   ?ALKPHOS 211*  --   ?BILITOT 1.2  --   ?PROT 6.9  --   ?ALBUMIN 2.2* 1.7*  ? ?No results for input(s): LIPASE, AMYLASE in the last 168 hours. ?No results for input(s): AMMONIA in the last 168 hours. ?CBC: ?Recent Labs  ?Lab 10/12/21 ?1327 10/13/21 ?0404 10/14/21 ?1524  ?WBC 7.9 7.7 7.8  ?NEUTROABS 6.2  --   --   ?HGB 10.0* 9.3* 8.9*  ?HCT 30.5* 29.4* 26.0*  ?MCV 84.0 84.7 81.5  ?PLT 315 288 254  ? ?Cardiac Enzymes: ?No results for input(s): CKTOTAL, CKMB, CKMBINDEX, TROPONINI in the last 168 hours. ?CBG: ?Recent Labs  ?Lab 10/16/21 ?0736 10/16/21 ?1111 10/16/21 ?1615 10/16/21 ?2322 10/17/21 ?0715  ?GLUCAP 113* 175* 187* 168* 132*  ? ? ?Iron Studies: No results for input(s): IRON, TIBC, TRANSFERRIN, FERRITIN in the last 72 hours. ?Studies/Results: ?No results found. ? apixaban  5 mg Oral BID  ? cephALEXin  250 mg Oral  Q8H  ? Chlorhexidine Gluconate Cloth  6 each Topical Daily  ? Chlorhexidine Gluconate Cloth  6 each Topical Q0600  ? Chlorhexidine Gluconate Cloth  6 each Topical Q0600  ? darbepoetin (ARANESP) injection - DIALYSIS  100 mcg Intravenous Q Fri-HD  ? doxycycline  100 mg Oral Q12H  ? insulin aspart  0-5 Units Subcutaneous QHS  ? insulin aspart  0-9 Units Subcutaneous TID WC  ? metoprolol succinate  50 mg Oral BID  ? midodrine  10 mg Oral Q T,Th,Sa-HD  ? sevelamer carbonate  800 mg Oral BID WC  ? tamsulosin  0.4 mg Oral QPC supper  ? ? ?BMET ?   ?Component Value Date/Time  ? NA 129 (L) 10/14/2021 1524  ? K 4.8 10/14/2021 1524  ? CL 95 (L) 10/14/2021 1524  ? CO2 22 10/14/2021 1524  ? GLUCOSE 175 (H) 10/14/2021 1524  ? BUN 75 (H) 10/14/2021 1524  ? CREATININE 6.02 (H) 10/14/2021 1524  ? CALCIUM 7.9 (L) 10/14/2021 1524  ? CALCIUM 8.6 01/21/2021 0630  ? GFRNONAA 10 (L) 10/14/2021 1524  ?  GFRAA 17 (L) 03/30/2020 0349  ? ?CBC ?   ?Component Value Date/Time  ? WBC 7.8 10/14/2021 1524  ? RBC 3.19 (L) 10/14/2021 1524  ? HGB 8.9 (L) 10/14/2021 1524  ? HCT 26.0 (L) 10/14/2021 1524  ? PLT 254 10/14/2021 1524  ? MCV 81.5 10/14/2021 1524  ? MCH 27.9 10/14/2021 1524  ? MCHC 34.2 10/14/2021 1524  ? RDW 17.1 (H) 10/14/2021 1524  ? LYMPHSABS 0.9 10/12/2021 1327  ? MONOABS 0.6 10/12/2021 1327  ? EOSABS 0.1 10/12/2021 1327  ? BASOSABS 0.0 10/12/2021 1327  ? ? ?Dialysis Orders: Center: [facility]  on TTS . ?EDW 108.5kg HD Bath 2K/2.5Ca  Time 4 hours Heparin none. Access LUE AVF BFR 400 DFR 500    ?Venofer  50 mg IV once a week  ?  ?  ?Assessment/Plan: ? Bilateral ankle osteomyelitis - started on vancomycin and ceftriaxone per primary svc.  Pt is not interested in discussing amputation or debridement.  To transition to doxycycline and cephalexin per ID.  ? ESRD -  Will plan for HD today and get back on outpatient schedule Thursday 10/20/21, hopefully as an outpatient. ? Hypertension/volume  - UF as tolerated and continue with midodrine due to  hypotension with HD.  ? Anemia  - follow H/H.  Given ESA on 10/14/21. ? Metabolic bone disease -  continue with home medications ? Nutrition - rneal diet, carb modified ? Atrial fibrillation with RVR - initially treated with cardizem drip, now on metoprolol and eliquis. ? DM - per primary ? ?[name], MD ?[facility] ? ? ?

## 2021-10-17 NOTE — TOC Transition Note (Signed)
Transition of Care (TOC) - CM/SW Discharge Note ? ? ?Patient Details  ?Name: Danny [name] ?MRN: [id number] ?Date of Birth: 1956/05/08 ? ?Transition of Care (TOC) CM/SW Contact:  ?Iona Beard, LCSWA ?Phone Number: ?10/17/2021, 1:21 PM ? ? ?Clinical Narrative:    ?CSW spoke with [name] at [facility] who states they can accept pt back to their facility today. Pt will go to room A1 bed 1. CSW to update RN once DC summary has been completed. CSW to call for transportation when pt is ready. TOC signing off.  ? ?Final next level of care: [place] ?Barriers to Discharge: Barriers Resolved ? ? ?Patient Goals and CMS Choice ?Patient states their goals for this hospitalization and ongoing recovery are:: Return to facility ?CMS Medicare.gov Compare Post Acute Care list provided to:: Patient ?Choice offered to / list presented to : Patient ? ?Discharge Placement ?  ?           ?  ?  ?  ?  ? ?Discharge Plan and Services ?  ?  ?Post Acute Care Choice: Nursing Home          ?  ?  ?  ?  ?  ?  ?  ?  ?  ?  ? ?Social Determinants of Health (SDOH) Interventions ?  ? ? ?Readmission Risk Interventions ? ?  10/13/2021  ?  9:08 AM 01/27/2021  ? 12:30 PM 01/13/2021  ?  1:30 PM  ?Readmission Risk Prevention Plan  ?Transportation Screening Complete Complete Complete  ?Medication Review Press photographer) Complete Complete Complete  ?PCP or Specialist appointment within 3-5 days of discharge Complete Complete   ?[place] or Home Care Consult Complete Complete Complete  ?SW Recovery Care/Counseling Consult Complete Complete Complete  ?Palliative Care Screening Not Applicable Not Complete Not Complete  ?Skilled Nursing Facility Complete Complete Complete  ? ? ? ? ? ?

## 2021-10-19 ENCOUNTER — Ambulatory Visit: Payer: Medicare Other | Admitting: Physician Assistant

## 2021-10-19 DIAGNOSIS — E11622 Type 2 diabetes mellitus with other skin ulcer: Secondary | ICD-10-CM | POA: Diagnosis not present

## 2021-10-19 DIAGNOSIS — L89513 Pressure ulcer of right ankle, stage 3: Secondary | ICD-10-CM | POA: Diagnosis not present

## 2021-10-19 DIAGNOSIS — L89520 Pressure ulcer of left ankle, unstageable: Secondary | ICD-10-CM | POA: Diagnosis not present

## 2021-10-19 DIAGNOSIS — E669 Obesity, unspecified: Secondary | ICD-10-CM | POA: Diagnosis not present

## 2021-10-19 LAB — CULTURE, BLOOD (ROUTINE X 2)
Culture: NO GROWTH
Culture: NO GROWTH
Special Requests: ADEQUATE

## 2021-10-20 DIAGNOSIS — I4891 Unspecified atrial fibrillation: Secondary | ICD-10-CM | POA: Diagnosis not present

## 2021-10-20 DIAGNOSIS — M6281 Muscle weakness (generalized): Secondary | ICD-10-CM | POA: Diagnosis not present

## 2021-10-20 DIAGNOSIS — R293 Abnormal posture: Secondary | ICD-10-CM | POA: Diagnosis not present

## 2021-10-20 DIAGNOSIS — R2681 Unsteadiness on feet: Secondary | ICD-10-CM | POA: Diagnosis not present

## 2021-10-20 DIAGNOSIS — I480 Paroxysmal atrial fibrillation: Secondary | ICD-10-CM | POA: Diagnosis not present

## 2021-10-25 DIAGNOSIS — I480 Paroxysmal atrial fibrillation: Secondary | ICD-10-CM | POA: Diagnosis not present

## 2021-10-25 DIAGNOSIS — R2681 Unsteadiness on feet: Secondary | ICD-10-CM | POA: Diagnosis not present

## 2021-10-25 DIAGNOSIS — R293 Abnormal posture: Secondary | ICD-10-CM | POA: Diagnosis not present

## 2021-10-25 DIAGNOSIS — M6281 Muscle weakness (generalized): Secondary | ICD-10-CM | POA: Diagnosis not present

## 2021-10-25 DIAGNOSIS — I4891 Unspecified atrial fibrillation: Secondary | ICD-10-CM | POA: Diagnosis not present

## 2021-10-25 NOTE — Progress Notes (Addendum)
? ?[facility] ?[address] ?[place], [place] [address] ? ? ?CLINIC:  ?Medical Oncology/Hematology ? ?PCP:  ?Celene Squibb, MD ?[facility] ?[place] [address] ?[phone] ? ? ?REASON FOR VISIT:  ?Follow-up for MGUS and anemia of CKD ?  ?PRIOR THERAPY: Retacrit ?  ?CURRENT THERAPY: Epogen injections at dialysis ? ?INTERVAL HISTORY:  ?[name] 66 y.o. male returns for routine follow-up of his anemia of ESRD and MGUS.  He was last seen by Danny Abernethy PA-C on 08/29/2021. ? ?At today's visit, he reports feeling fair.   ?He was hospitalized from 10/12/2021 through 10/17/2021 for A-fib with RVR in the setting of acute bilateral osteomyelitis.   ? ?He remains on Eliquis for atrial fibrillation.  He has history of Hemoccult positive stool, but denies any obvious bleeding such as bright red blood per rectum, melena, or epistaxis.  He has upcoming appointment with GI to discuss possible EGD and colonoscopy.  He has significant fatigue, which was especially worsened after his most recent hospital stay.  He reports that he has "always left ice chips."  He denies any restless legs, headaches, lightheadedness, or syncope.  He has baseline dyspnea on exertion and occasional chest pain which is at baseline. ?He denies any new bone pain, B symptoms, or neurologic changes. ? ?He has been receiving his daily folic acid supplement and monthly B12 injections at SNF. ? ?He has 40% energy and 75% appetite. He endorses that he is maintaining a stable weight. ? ? ?REVIEW OF SYSTEMS:  ?Review of Systems  ?Constitutional:  Positive for fatigue. Negative for appetite change, chills, diaphoresis, fever and unexpected weight change.  ?HENT:   Negative for lump/mass and nosebleeds.   ?Eyes:  Negative for eye problems.  ?Respiratory:  Positive for cough (nighttime) and shortness of breath (with exertion). Negative for hemoptysis.   ?Cardiovascular:  Positive for chest pain (A fib). Negative for leg swelling and  palpitations.  ?Gastrointestinal:  Positive for nausea. Negative for abdominal pain, blood in stool, constipation, diarrhea and vomiting.  ?Genitourinary:  Negative for hematuria.   ?Skin: Negative.   ?Neurological:  Positive for numbness. Negative for dizziness, headaches and light-headedness.  ?Hematological:  Does not bruise/bleed easily.   ? ? ?PAST MEDICAL/SURGICAL HISTORY:  ?Past Medical History:  ?Diagnosis Date  ? Anemia   ? Ankle ulcer ([place])   ? Arthritis   ? CKD (chronic kidney disease), stage V ([place])   ? Diabetes mellitus without complication ([place])   ? Diastolic congestive heart failure ([place])   ? Foot ulcer ([place])   ? Gout   ? Hypertension   ? Urinary retention   ? ?Past Surgical History:  ?Procedure Laterality Date  ? ANKLE SURGERY Right   ? AV FISTULA PLACEMENT Left 04/26/2021  ? Procedure: LEFT ARM ARTERIOVENOUS (AV) FISTULA CREATION (BRACHIOCEPHALIC);  Surgeon: Rosetta Posner, MD;  Location: [facility];  Service: Vascular;  Laterality: Left;  ? CHOLECYSTECTOMY    ? FISTULA SUPERFICIALIZATION Left 07/12/2021  ? Procedure: LEFT ARM ARTERIOVENOUS FISTULA SUPERFICIALIZATION;  Surgeon: Rosetta Posner, MD;  Location: [facility];  Service: Vascular;  Laterality: Left;  ? FOOT SURGERY Right   ? INSERTION OF DIALYSIS CATHETER Right 01/14/2021  ? Procedure: INSERTION OF TUNNELED DIALYSIS CATHETER RIGHT INTERNAL JUGULAR;  Surgeon: Virl Cagey, MD;  Location: [facility];  Service: General;  Laterality: Right;  ? REMOVAL OF A DIALYSIS CATHETER N/A 09/06/2021  ? Procedure: MINOR REMOVAL OF TUNNELED DIALYSIS CATHETER;  Surgeon: Donnetta Hutching,  Arvilla Meres, MD;  Location: [facility];  Service: Vascular;  Laterality: N/A;  ? TRANSURETHRAL RESECTION OF PROSTATE N/A 01/15/2020  ? Procedure: TRANSURETHRAL RESECTION OF THE PROSTATE (TURP)  with General anesthesia and spinal;  Surgeon: Cleon Gustin, MD;  Location: [facility];  Service: Urology;  Laterality: N/A;  ? ? ? ?SOCIAL HISTORY:  ?Social History  ? ?Socioeconomic History  ? Marital status:  Divorced  ?  Spouse name: Not on file  ? Number of children: Not on file  ? Years of education: Not on file  ? Highest education level: Not on file  ?Occupational History  ? Not on file  ?Tobacco Use  ? Smoking status: Former  ?  Packs/day: 1.50  ?  Years: 10.00  ?  Pack years: 15.00  ?  Types: Cigarettes  ?  Quit date: 06/02/1987  ?  Years since quitting: 34.4  ? Smokeless tobacco: Never  ?Vaping Use  ? Vaping Use: Never used  ?Substance and Sexual Activity  ? Alcohol use: Not Currently  ? Drug use: Never  ? Sexual activity: Not Currently  ?Other Topics Concern  ? Not on file  ?Social History Narrative  ? Not on file  ? ?Social Determinants of Health  ? ?Financial Resource Strain: Not on file  ?Food Insecurity: Not on file  ?Transportation Needs: Not on file  ?Physical Activity: Not on file  ?Stress: Not on file  ?Social Connections: Not on file  ?Intimate Partner Violence: Not on file  ? ? ?FAMILY HISTORY:  ?Family History  ?Problem Relation Age of Onset  ? Diabetes Mother   ? Heart attack Mother   ? Heart attack Father   ? Diabetes Brother   ? ? ?CURRENT MEDICATIONS:  ?Outpatient Encounter Medications as of 10/26/2021  ?Medication Sig  ? acetaminophen (TYLENOL) 500 MG tablet Take 1,000 mg by mouth every 6 (six) hours as needed for moderate pain or headache.  ? apixaban (ELIQUIS) 5 MG TABS tablet Take 1 tablet (5 mg total) by mouth 2 (two) times daily.  ? cephALEXin (KEFLEX) 250 MG capsule Take 1 capsule (250 mg total) by mouth every 8 (eight) hours for 14 days.  ? cholecalciferol (VITAMIN D3) 25 MCG (1000 UNIT) tablet Take 1,000 Units by mouth See admin instructions. Monday's, Wednesday's, Friday's, and Sunday's in the morning,and Tuesday's, Thursday's, and Saturday's at bedtime  ? Cyanocobalamin (B-12 COMPLIANCE INJECTION) 1000 MCG/ML KIT Inject 1,000 mcg as directed every 28 (twenty-eight) days.  ? docusate sodium (COLACE) 100 MG capsule Take 100 mg by mouth 2 (two) times daily.  ? doxycycline (VIBRA-TABS) 100 MG  tablet Take 1 tablet (100 mg total) by mouth every 12 (twelve) hours for 14 days.  ? ferrous sulfate 325 (65 FE) MG tablet Take 325 mg by mouth 2 (two) times daily.  ? fluticasone (FLONASE) 50 MCG/ACT nasal spray Place 2 sprays into both nostrils daily.  ? folic acid (FOLVITE) 537 MCG tablet Take 1 tablet (400 mcg total) by mouth daily.  ? Insulin NPH Isophane & Regular (HUMULIN 70/30 KWIKPEN [place]) Inject 5 Units into the skin 2 (two) times daily.  ? Lactulose 20 GM/30ML SOLN Take 30 mLs (20 g total) by mouth daily. (Patient taking differently: Take 20 g by mouth 2 (two) times daily.)  ? metoprolol succinate (TOPROL XL) 50 MG 24 hr tablet Take 1 tablet (50 mg total) by mouth in the morning and at bedtime. Take with or immediately following a meal.  ? midodrine (PROAMATINE) 10 MG tablet  Take 10 mg by mouth Every Tuesday,Thursday,and Saturday with dialysis.  ? Multiple Vitamin (MULTIVITAMIN WITH MINERALS) TABS tablet Take 1 tablet by mouth daily.  ? Nutritional Supplements (FEEDING SUPPLEMENT, NEPRO CARB STEADY,) LIQD Take 237 mLs by mouth 2 (two) times daily between meals.  ? Omega-3 Fatty Acids (FISH OIL) 500 MG CAPS Take 500 mg by mouth 2 (two) times daily.  ? ondansetron (ZOFRAN) 4 MG tablet Take 4 mg by mouth every 6 (six) hours as needed for nausea or vomiting.  ? oxycodone (OXY-IR) 5 MG capsule Take 1 capsule (5 mg total) by mouth every 6 (six) hours as needed (severe pain).  ? polyethylene glycol (MIRALAX / GLYCOLAX) 17 g packet Take 17 g by mouth daily.  ? PROTEIN PO Take 30 mLs by mouth 3 (three) times daily.  ? rosuvastatin (CRESTOR) 5 MG tablet Take 5 mg by mouth every evening.  ? senna (SENOKOT) 8.6 MG tablet Take 2 tablets by mouth 2 (two) times daily.  ? sevelamer carbonate (RENVELA) 800 MG tablet Take 1 tablet (800 mg total) by mouth 2 (two) times daily with a meal.  ? sodium phosphate (FLEET) 7-19 GM/118ML ENEM Place 1 enema rectally daily as needed for severe constipation.  ? tamsulosin (FLOMAX) 0.4 MG  CAPS capsule Take 1 capsule (0.4 mg total) by mouth daily after supper. (Patient not taking: Reported on 10/13/2021)  ? vitamin C (ASCORBIC ACID) 500 MG tablet Take 1,000 mg by mouth 2 (two) times daily.  ? vitami

## 2021-10-26 ENCOUNTER — Inpatient Hospital Stay (HOSPITAL_COMMUNITY): Payer: Medicare Other | Attending: Physician Assistant | Admitting: Physician Assistant

## 2021-10-26 ENCOUNTER — Inpatient Hospital Stay (HOSPITAL_COMMUNITY): Payer: Medicare Other

## 2021-10-26 VITALS — BP 102/65 | HR 96 | Temp 98.6°F | Resp 18 | Wt 246.4 lb

## 2021-10-26 DIAGNOSIS — E538 Deficiency of other specified B group vitamins: Secondary | ICD-10-CM | POA: Diagnosis not present

## 2021-10-26 DIAGNOSIS — Z992 Dependence on renal dialysis: Secondary | ICD-10-CM | POA: Diagnosis not present

## 2021-10-26 DIAGNOSIS — R0609 Other forms of dyspnea: Secondary | ICD-10-CM | POA: Diagnosis not present

## 2021-10-26 DIAGNOSIS — R2681 Unsteadiness on feet: Secondary | ICD-10-CM | POA: Diagnosis not present

## 2021-10-26 DIAGNOSIS — E669 Obesity, unspecified: Secondary | ICD-10-CM | POA: Diagnosis not present

## 2021-10-26 DIAGNOSIS — L89513 Pressure ulcer of right ankle, stage 3: Secondary | ICD-10-CM | POA: Diagnosis not present

## 2021-10-26 DIAGNOSIS — E11622 Type 2 diabetes mellitus with other skin ulcer: Secondary | ICD-10-CM | POA: Diagnosis not present

## 2021-10-26 DIAGNOSIS — D631 Anemia in chronic kidney disease: Secondary | ICD-10-CM | POA: Insufficient documentation

## 2021-10-26 DIAGNOSIS — R079 Chest pain, unspecified: Secondary | ICD-10-CM | POA: Diagnosis not present

## 2021-10-26 DIAGNOSIS — R293 Abnormal posture: Secondary | ICD-10-CM | POA: Diagnosis not present

## 2021-10-26 DIAGNOSIS — I5032 Chronic diastolic (congestive) heart failure: Secondary | ICD-10-CM | POA: Insufficient documentation

## 2021-10-26 DIAGNOSIS — I4891 Unspecified atrial fibrillation: Secondary | ICD-10-CM | POA: Diagnosis not present

## 2021-10-26 DIAGNOSIS — Z7901 Long term (current) use of anticoagulants: Secondary | ICD-10-CM | POA: Diagnosis not present

## 2021-10-26 DIAGNOSIS — I132 Hypertensive heart and chronic kidney disease with heart failure and with stage 5 chronic kidney disease, or end stage renal disease: Secondary | ICD-10-CM | POA: Diagnosis not present

## 2021-10-26 DIAGNOSIS — D508 Other iron deficiency anemias: Secondary | ICD-10-CM

## 2021-10-26 DIAGNOSIS — Z79899 Other long term (current) drug therapy: Secondary | ICD-10-CM | POA: Insufficient documentation

## 2021-10-26 DIAGNOSIS — M6281 Muscle weakness (generalized): Secondary | ICD-10-CM | POA: Diagnosis not present

## 2021-10-26 DIAGNOSIS — D472 Monoclonal gammopathy: Secondary | ICD-10-CM | POA: Insufficient documentation

## 2021-10-26 DIAGNOSIS — L89520 Pressure ulcer of left ankle, unstageable: Secondary | ICD-10-CM | POA: Diagnosis not present

## 2021-10-26 DIAGNOSIS — E1122 Type 2 diabetes mellitus with diabetic chronic kidney disease: Secondary | ICD-10-CM | POA: Insufficient documentation

## 2021-10-26 DIAGNOSIS — N186 End stage renal disease: Secondary | ICD-10-CM | POA: Insufficient documentation

## 2021-10-26 DIAGNOSIS — I480 Paroxysmal atrial fibrillation: Secondary | ICD-10-CM | POA: Diagnosis not present

## 2021-10-26 LAB — CBC WITH DIFFERENTIAL/PLATELET
Abs Immature Granulocytes: 0.02 10*3/uL (ref 0.00–0.07)
Basophils Absolute: 0.1 10*3/uL (ref 0.0–0.1)
Basophils Relative: 1 %
Eosinophils Absolute: 0.2 10*3/uL (ref 0.0–0.5)
Eosinophils Relative: 4 %
HCT: 29.2 % — ABNORMAL LOW (ref 39.0–52.0)
Hemoglobin: 9.7 g/dL — ABNORMAL LOW (ref 13.0–17.0)
Immature Granulocytes: 0 %
Lymphocytes Relative: 12 %
Lymphs Abs: 0.8 10*3/uL (ref 0.7–4.0)
MCH: 27.8 pg (ref 26.0–34.0)
MCHC: 33.2 g/dL (ref 30.0–36.0)
MCV: 83.7 fL (ref 80.0–100.0)
Monocytes Absolute: 0.6 10*3/uL (ref 0.1–1.0)
Monocytes Relative: 8 %
Neutro Abs: 4.9 10*3/uL (ref 1.7–7.7)
Neutrophils Relative %: 75 %
Platelets: 321 10*3/uL (ref 150–400)
RBC: 3.49 MIL/uL — ABNORMAL LOW (ref 4.22–5.81)
RDW: 18.6 % — ABNORMAL HIGH (ref 11.5–15.5)
WBC: 6.6 10*3/uL (ref 4.0–10.5)
nRBC: 0 % (ref 0.0–0.2)

## 2021-10-26 LAB — VITAMIN B12: Vitamin B-12: 4266 pg/mL — ABNORMAL HIGH (ref 180–914)

## 2021-10-26 LAB — IRON AND TIBC
Iron: 29 ug/dL — ABNORMAL LOW (ref 45–182)
Saturation Ratios: 18 % (ref 17.9–39.5)
TIBC: 162 ug/dL — ABNORMAL LOW (ref 250–450)
UIBC: 133 ug/dL

## 2021-10-26 LAB — FERRITIN: Ferritin: 305 ng/mL (ref 24–336)

## 2021-10-26 LAB — FOLATE: Folate: 15.2 ng/mL (ref >5.9)

## 2021-10-26 LAB — SAMPLE TO BLOOD BANK

## 2021-10-26 NOTE — Patient Instructions (Signed)
[employer] at [facility] ?Discharge Instructions ? ?You were seen today by Tarri Abernethy PA-C for your anemia and your MGUS.   ? ?ANEMIA: Your blood levels remain low... ?Continue B12 injections once a month (instead of once daily pill) ?Continue taking folic acid 782 mcg daily. ?Continue follow-up with gastroenterology (Dr. Jenetta Downer) to complete the EGD and colonoscopy that were canceled in October 2022. ?We will discuss with Dr. Theador Hawthorne to increase your Epogen (medication given during dialysis that increases your blood count) ?We will check your blood count once a month ?Follow-up visit in 3 months.  If that does not work to increase your blood counts, we would consider bone marrow biopsy. ? ?MGUS: Your MGUS-protein levels were stable when they were last checked.  We will check them again around September/October 2023. ? ?FOLLOW-UP APPOINTMENT: Follow-up visit in 3 months ? ? ?Thank you for choosing [place] at [facility] to provide your oncology and hematology care.  To afford each patient quality time with our provider, please arrive at least 15 minutes before your scheduled appointment time.  ? ?If you have a lab appointment with the [facility] please come in thru the Main Entrance and check in at the main information desk. ? ?You need to re-schedule your appointment should you arrive 10 or more minutes late.  We strive to give you quality time with our providers, and arriving late affects you and other patients whose appointments are after yours.  Also, if you no show three or more times for appointments you may be dismissed from the clinic at the providers discretion.     ?Again, thank you for choosing [facility].  Our hope is that these requests will decrease the amount of time that you wait before being seen by our physicians.       ?_____________________________________________________________ ? ?Should you have questions after your  visit to [facility], please contact our office at [phone] and follow the prompts.  Our office hours are 8:00 a.m. and 4:30 p.m. Monday - Friday.  Please note that voicemails left after 4:00 p.m. may not be returned until the following business day.  We are closed weekends and major holidays.  You do have access to a nurse 24-7, just call the main number to the clinic [phone] and do not press any options, hold on the line and a nurse will answer the phone.   ? ?For prescription refill requests, have your pharmacy contact our office and allow 72 hours.   ? ?Due to Covid, you will need to wear a mask upon entering the hospital. If you do not have a mask, a mask will be given to you at the Main Entrance upon arrival. For doctor visits, patients may have 1 support person age 59 or older with them. For treatment visits, patients can not have anyone with them due to social distancing guidelines and our immunocompromised population.  ? ? ? ?

## 2021-10-27 LAB — HOMOCYSTEINE: Homocysteine: 20.3 umol/L — ABNORMAL HIGH (ref 0.0–17.2)

## 2021-10-28 ENCOUNTER — Telehealth (HOSPITAL_COMMUNITY): Payer: Self-pay | Admitting: Physician Assistant

## 2021-10-28 DIAGNOSIS — I4891 Unspecified atrial fibrillation: Secondary | ICD-10-CM | POA: Diagnosis not present

## 2021-10-28 DIAGNOSIS — M6281 Muscle weakness (generalized): Secondary | ICD-10-CM | POA: Diagnosis not present

## 2021-10-28 DIAGNOSIS — L89523 Pressure ulcer of left ankle, stage 3: Secondary | ICD-10-CM | POA: Diagnosis not present

## 2021-10-28 DIAGNOSIS — R2681 Unsteadiness on feet: Secondary | ICD-10-CM | POA: Diagnosis not present

## 2021-10-28 DIAGNOSIS — L89513 Pressure ulcer of right ankle, stage 3: Secondary | ICD-10-CM | POA: Diagnosis not present

## 2021-10-28 DIAGNOSIS — I480 Paroxysmal atrial fibrillation: Secondary | ICD-10-CM | POA: Diagnosis not present

## 2021-10-28 DIAGNOSIS — R293 Abnormal posture: Secondary | ICD-10-CM | POA: Diagnosis not present

## 2021-10-28 DIAGNOSIS — L89153 Pressure ulcer of sacral region, stage 3: Secondary | ICD-10-CM | POA: Diagnosis not present

## 2021-10-28 DIAGNOSIS — L98492 Non-pressure chronic ulcer of skin of other sites with fat layer exposed: Secondary | ICD-10-CM | POA: Diagnosis not present

## 2021-10-28 DIAGNOSIS — D464 Refractory anemia, unspecified: Secondary | ICD-10-CM

## 2021-10-28 NOTE — Telephone Encounter (Signed)
UPDATED PLAN:   Patient was seen by me on 10/26/2021, but after further discussion with Dr. Theador Hawthorne (nephrologist) and Dr. Delton Coombes (supervising hematologist/oncologist), we will make the following changes to the patient's plan regarding his normocytic anemia. ? ?- Per Dr. Theador Hawthorne, and patient is already on maximum dose of Mircera. ?- Therefore, before increasing dose of ESA, we will check bone marrow biopsy to rule out any underlying infiltrative process or bone marrow disorder as the cause of patient's refractory anemia. ?- We will also check PTH and Parvovirus B19 DNA to investigate other causes of refractory anemia. ?- If bone marrow biopsy is within normal limits, and if patient requires increased dose of ESA, we will take over management of this at our clinic with either Retacrit, Procrit, or Aranesp. ? ?I called [name] this afternoon to discuss the above changes with him.  We discussed reasoning for obtaining bone marrow biopsy in detail, as well as risks and benefits of the procedure.  Patient agrees to proceed with bone marrow biopsy.  We will schedule him for CT-guided bone marrow biopsy at [facility].  We will see if we can add his PTH and Parvovirus B19 DNA to labs that are drawn at [facility] just prior to his bone marrow biopsy. ? ?I will see patient for follow-up visit 1 week after bone marrow biopsy. ? ?Tarri Abernethy PA-C ?10/28/2021 5:34 PM ?

## 2021-10-29 LAB — METHYLMALONIC ACID, SERUM: Methylmalonic Acid, Quantitative: 1145 nmol/L — ABNORMAL HIGH (ref 0–378)

## 2021-10-31 DIAGNOSIS — I480 Paroxysmal atrial fibrillation: Secondary | ICD-10-CM | POA: Diagnosis not present

## 2021-10-31 DIAGNOSIS — R2681 Unsteadiness on feet: Secondary | ICD-10-CM | POA: Diagnosis not present

## 2021-10-31 DIAGNOSIS — M6281 Muscle weakness (generalized): Secondary | ICD-10-CM | POA: Diagnosis not present

## 2021-10-31 DIAGNOSIS — R293 Abnormal posture: Secondary | ICD-10-CM | POA: Diagnosis not present

## 2021-10-31 DIAGNOSIS — I4891 Unspecified atrial fibrillation: Secondary | ICD-10-CM | POA: Diagnosis not present

## 2021-11-02 DIAGNOSIS — R2681 Unsteadiness on feet: Secondary | ICD-10-CM | POA: Diagnosis not present

## 2021-11-02 DIAGNOSIS — R293 Abnormal posture: Secondary | ICD-10-CM | POA: Diagnosis not present

## 2021-11-02 DIAGNOSIS — I480 Paroxysmal atrial fibrillation: Secondary | ICD-10-CM | POA: Diagnosis not present

## 2021-11-02 DIAGNOSIS — E669 Obesity, unspecified: Secondary | ICD-10-CM | POA: Diagnosis not present

## 2021-11-02 DIAGNOSIS — E11622 Type 2 diabetes mellitus with other skin ulcer: Secondary | ICD-10-CM | POA: Diagnosis not present

## 2021-11-02 DIAGNOSIS — L89513 Pressure ulcer of right ankle, stage 3: Secondary | ICD-10-CM | POA: Diagnosis not present

## 2021-11-02 DIAGNOSIS — M6281 Muscle weakness (generalized): Secondary | ICD-10-CM | POA: Diagnosis not present

## 2021-11-02 DIAGNOSIS — L89520 Pressure ulcer of left ankle, unstageable: Secondary | ICD-10-CM | POA: Diagnosis not present

## 2021-11-02 DIAGNOSIS — I4891 Unspecified atrial fibrillation: Secondary | ICD-10-CM | POA: Diagnosis not present

## 2021-11-04 DIAGNOSIS — M6281 Muscle weakness (generalized): Secondary | ICD-10-CM | POA: Diagnosis not present

## 2021-11-04 DIAGNOSIS — I480 Paroxysmal atrial fibrillation: Secondary | ICD-10-CM | POA: Diagnosis not present

## 2021-11-04 DIAGNOSIS — I4891 Unspecified atrial fibrillation: Secondary | ICD-10-CM | POA: Diagnosis not present

## 2021-11-04 DIAGNOSIS — R293 Abnormal posture: Secondary | ICD-10-CM | POA: Diagnosis not present

## 2021-11-04 DIAGNOSIS — R2681 Unsteadiness on feet: Secondary | ICD-10-CM | POA: Diagnosis not present

## 2021-11-07 DIAGNOSIS — M6281 Muscle weakness (generalized): Secondary | ICD-10-CM | POA: Diagnosis not present

## 2021-11-07 DIAGNOSIS — I4891 Unspecified atrial fibrillation: Secondary | ICD-10-CM | POA: Diagnosis not present

## 2021-11-07 DIAGNOSIS — R293 Abnormal posture: Secondary | ICD-10-CM | POA: Diagnosis not present

## 2021-11-07 DIAGNOSIS — I480 Paroxysmal atrial fibrillation: Secondary | ICD-10-CM | POA: Diagnosis not present

## 2021-11-07 DIAGNOSIS — R2681 Unsteadiness on feet: Secondary | ICD-10-CM | POA: Diagnosis not present

## 2021-11-08 DIAGNOSIS — R293 Abnormal posture: Secondary | ICD-10-CM | POA: Diagnosis not present

## 2021-11-08 DIAGNOSIS — M6281 Muscle weakness (generalized): Secondary | ICD-10-CM | POA: Diagnosis not present

## 2021-11-08 DIAGNOSIS — R2681 Unsteadiness on feet: Secondary | ICD-10-CM | POA: Diagnosis not present

## 2021-11-08 DIAGNOSIS — I4891 Unspecified atrial fibrillation: Secondary | ICD-10-CM | POA: Diagnosis not present

## 2021-11-08 DIAGNOSIS — I480 Paroxysmal atrial fibrillation: Secondary | ICD-10-CM | POA: Diagnosis not present

## 2021-11-09 DIAGNOSIS — R2681 Unsteadiness on feet: Secondary | ICD-10-CM | POA: Diagnosis not present

## 2021-11-09 DIAGNOSIS — R293 Abnormal posture: Secondary | ICD-10-CM | POA: Diagnosis not present

## 2021-11-09 DIAGNOSIS — E11622 Type 2 diabetes mellitus with other skin ulcer: Secondary | ICD-10-CM | POA: Diagnosis not present

## 2021-11-09 DIAGNOSIS — L89513 Pressure ulcer of right ankle, stage 3: Secondary | ICD-10-CM | POA: Diagnosis not present

## 2021-11-09 DIAGNOSIS — L89520 Pressure ulcer of left ankle, unstageable: Secondary | ICD-10-CM | POA: Diagnosis not present

## 2021-11-09 DIAGNOSIS — M6281 Muscle weakness (generalized): Secondary | ICD-10-CM | POA: Diagnosis not present

## 2021-11-09 DIAGNOSIS — I4891 Unspecified atrial fibrillation: Secondary | ICD-10-CM | POA: Diagnosis not present

## 2021-11-09 DIAGNOSIS — I480 Paroxysmal atrial fibrillation: Secondary | ICD-10-CM | POA: Diagnosis not present

## 2021-11-09 DIAGNOSIS — E669 Obesity, unspecified: Secondary | ICD-10-CM | POA: Diagnosis not present

## 2021-11-11 ENCOUNTER — Ambulatory Visit (HOSPITAL_COMMUNITY): Payer: Medicare Other

## 2021-11-11 ENCOUNTER — Other Ambulatory Visit (HOSPITAL_COMMUNITY): Payer: Self-pay | Admitting: Radiology

## 2021-11-11 DIAGNOSIS — R2681 Unsteadiness on feet: Secondary | ICD-10-CM | POA: Diagnosis not present

## 2021-11-11 DIAGNOSIS — L89513 Pressure ulcer of right ankle, stage 3: Secondary | ICD-10-CM | POA: Diagnosis not present

## 2021-11-11 DIAGNOSIS — L89153 Pressure ulcer of sacral region, stage 3: Secondary | ICD-10-CM | POA: Diagnosis not present

## 2021-11-11 DIAGNOSIS — R293 Abnormal posture: Secondary | ICD-10-CM | POA: Diagnosis not present

## 2021-11-11 DIAGNOSIS — I480 Paroxysmal atrial fibrillation: Secondary | ICD-10-CM | POA: Diagnosis not present

## 2021-11-11 DIAGNOSIS — D649 Anemia, unspecified: Secondary | ICD-10-CM

## 2021-11-11 DIAGNOSIS — L98492 Non-pressure chronic ulcer of skin of other sites with fat layer exposed: Secondary | ICD-10-CM | POA: Diagnosis not present

## 2021-11-11 DIAGNOSIS — I4891 Unspecified atrial fibrillation: Secondary | ICD-10-CM | POA: Diagnosis not present

## 2021-11-11 DIAGNOSIS — L89523 Pressure ulcer of left ankle, stage 3: Secondary | ICD-10-CM | POA: Diagnosis not present

## 2021-11-11 DIAGNOSIS — M6281 Muscle weakness (generalized): Secondary | ICD-10-CM | POA: Diagnosis not present

## 2021-11-14 ENCOUNTER — Ambulatory Visit (HOSPITAL_COMMUNITY)
Admission: RE | Admit: 2021-11-14 | Discharge: 2021-11-14 | Disposition: A | Discharge status: Home / Self Care | Payer: Medicare Other | Source: Ambulatory Visit | Attending: Physician Assistant | Admitting: Physician Assistant

## 2021-11-14 ENCOUNTER — Encounter (HOSPITAL_COMMUNITY): Payer: Self-pay

## 2021-11-14 ENCOUNTER — Other Ambulatory Visit: Payer: Self-pay

## 2021-11-14 DIAGNOSIS — L89159 Pressure ulcer of sacral region, unspecified stage: Secondary | ICD-10-CM | POA: Insufficient documentation

## 2021-11-14 DIAGNOSIS — E11622 Type 2 diabetes mellitus with other skin ulcer: Secondary | ICD-10-CM | POA: Insufficient documentation

## 2021-11-14 DIAGNOSIS — N186 End stage renal disease: Secondary | ICD-10-CM | POA: Insufficient documentation

## 2021-11-14 DIAGNOSIS — I132 Hypertensive heart and chronic kidney disease with heart failure and with stage 5 chronic kidney disease, or end stage renal disease: Secondary | ICD-10-CM | POA: Diagnosis not present

## 2021-11-14 DIAGNOSIS — R600 Localized edema: Secondary | ICD-10-CM | POA: Diagnosis not present

## 2021-11-14 DIAGNOSIS — I4891 Unspecified atrial fibrillation: Secondary | ICD-10-CM | POA: Insufficient documentation

## 2021-11-14 DIAGNOSIS — R898 Other abnormal findings in specimens from other organs, systems and tissues: Secondary | ICD-10-CM | POA: Insufficient documentation

## 2021-11-14 DIAGNOSIS — R293 Abnormal posture: Secondary | ICD-10-CM | POA: Diagnosis not present

## 2021-11-14 DIAGNOSIS — N320 Bladder-neck obstruction: Secondary | ICD-10-CM | POA: Diagnosis not present

## 2021-11-14 DIAGNOSIS — D472 Monoclonal gammopathy: Secondary | ICD-10-CM | POA: Insufficient documentation

## 2021-11-14 DIAGNOSIS — L97329 Non-pressure chronic ulcer of left ankle with unspecified severity: Secondary | ICD-10-CM | POA: Diagnosis not present

## 2021-11-14 DIAGNOSIS — Z8249 Family history of ischemic heart disease and other diseases of the circulatory system: Secondary | ICD-10-CM | POA: Diagnosis not present

## 2021-11-14 DIAGNOSIS — M109 Gout, unspecified: Secondary | ICD-10-CM | POA: Diagnosis not present

## 2021-11-14 DIAGNOSIS — E669 Obesity, unspecified: Secondary | ICD-10-CM | POA: Insufficient documentation

## 2021-11-14 DIAGNOSIS — E1122 Type 2 diabetes mellitus with diabetic chronic kidney disease: Secondary | ICD-10-CM | POA: Insufficient documentation

## 2021-11-14 DIAGNOSIS — M1990 Unspecified osteoarthritis, unspecified site: Secondary | ICD-10-CM | POA: Diagnosis not present

## 2021-11-14 DIAGNOSIS — N401 Enlarged prostate with lower urinary tract symptoms: Secondary | ICD-10-CM | POA: Diagnosis not present

## 2021-11-14 DIAGNOSIS — N138 Other obstructive and reflux uropathy: Secondary | ICD-10-CM | POA: Diagnosis not present

## 2021-11-14 DIAGNOSIS — I5030 Unspecified diastolic (congestive) heart failure: Secondary | ICD-10-CM | POA: Insufficient documentation

## 2021-11-14 DIAGNOSIS — I480 Paroxysmal atrial fibrillation: Secondary | ICD-10-CM | POA: Diagnosis not present

## 2021-11-14 DIAGNOSIS — L97319 Non-pressure chronic ulcer of right ankle with unspecified severity: Secondary | ICD-10-CM | POA: Insufficient documentation

## 2021-11-14 DIAGNOSIS — D464 Refractory anemia, unspecified: Secondary | ICD-10-CM | POA: Diagnosis not present

## 2021-11-14 DIAGNOSIS — Z833 Family history of diabetes mellitus: Secondary | ICD-10-CM | POA: Diagnosis not present

## 2021-11-14 DIAGNOSIS — D72822 Plasmacytosis: Secondary | ICD-10-CM | POA: Diagnosis not present

## 2021-11-14 DIAGNOSIS — R234 Changes in skin texture: Secondary | ICD-10-CM | POA: Insufficient documentation

## 2021-11-14 DIAGNOSIS — Z794 Long term (current) use of insulin: Secondary | ICD-10-CM | POA: Insufficient documentation

## 2021-11-14 DIAGNOSIS — Q998 Other specified chromosome abnormalities: Secondary | ICD-10-CM | POA: Insufficient documentation

## 2021-11-14 DIAGNOSIS — R2681 Unsteadiness on feet: Secondary | ICD-10-CM | POA: Diagnosis not present

## 2021-11-14 DIAGNOSIS — I878 Other specified disorders of veins: Secondary | ICD-10-CM | POA: Insufficient documentation

## 2021-11-14 DIAGNOSIS — E538 Deficiency of other specified B group vitamins: Secondary | ICD-10-CM | POA: Insufficient documentation

## 2021-11-14 DIAGNOSIS — D6489 Other specified anemias: Secondary | ICD-10-CM | POA: Diagnosis not present

## 2021-11-14 DIAGNOSIS — D649 Anemia, unspecified: Secondary | ICD-10-CM | POA: Diagnosis not present

## 2021-11-14 DIAGNOSIS — M6281 Muscle weakness (generalized): Secondary | ICD-10-CM | POA: Diagnosis not present

## 2021-11-14 DIAGNOSIS — Z992 Dependence on renal dialysis: Secondary | ICD-10-CM | POA: Insufficient documentation

## 2021-11-14 LAB — CBC WITH DIFFERENTIAL/PLATELET
Abs Immature Granulocytes: 0.03 10*3/uL (ref 0.00–0.07)
Basophils Absolute: 0 10*3/uL (ref 0.0–0.1)
Basophils Relative: 0 %
Eosinophils Absolute: 0.2 10*3/uL (ref 0.0–0.5)
Eosinophils Relative: 2 %
HCT: 29.2 % — ABNORMAL LOW (ref 39.0–52.0)
Hemoglobin: 9.6 g/dL — ABNORMAL LOW (ref 13.0–17.0)
Immature Granulocytes: 0 %
Lymphocytes Relative: 10 %
Lymphs Abs: 0.7 10*3/uL (ref 0.7–4.0)
MCH: 27.1 pg (ref 26.0–34.0)
MCHC: 32.9 g/dL (ref 30.0–36.0)
MCV: 82.5 fL (ref 80.0–100.0)
Monocytes Absolute: 0.8 10*3/uL (ref 0.1–1.0)
Monocytes Relative: 11 %
Neutro Abs: 5.6 10*3/uL (ref 1.7–7.7)
Neutrophils Relative %: 77 %
Platelets: 259 10*3/uL (ref 150–400)
RBC: 3.54 MIL/uL — ABNORMAL LOW (ref 4.22–5.81)
RDW: 17.6 % — ABNORMAL HIGH (ref 11.5–15.5)
WBC: 7.3 10*3/uL (ref 4.0–10.5)
nRBC: 0 % (ref 0.0–0.2)

## 2021-11-14 LAB — GLUCOSE, CAPILLARY: Glucose-Capillary: 104 mg/dL — ABNORMAL HIGH (ref 70–99)

## 2021-11-14 MED ORDER — SODIUM CHLORIDE 0.9 % IV SOLN: INTRAVENOUS | Status: DC

## 2021-11-14 MED ORDER — FENTANYL CITRATE (PF) 100 MCG/2ML IJ SOLN
INTRAMUSCULAR | Status: AC
  Filled 2021-11-14: qty 2

## 2021-11-14 MED ORDER — MIDAZOLAM HCL 2 MG/2ML IJ SOLN
INTRAMUSCULAR | Status: AC | PRN
  Administered 2021-11-14 (×2): 1 mg via INTRAVENOUS

## 2021-11-14 MED ORDER — FENTANYL CITRATE (PF) 100 MCG/2ML IJ SOLN
INTRAMUSCULAR | Status: AC | PRN
  Administered 2021-11-14 (×2): 50 ug via INTRAVENOUS

## 2021-11-14 MED ORDER — MIDAZOLAM HCL 2 MG/2ML IJ SOLN
INTRAMUSCULAR | Status: AC
  Filled 2021-11-14: qty 4

## 2021-11-14 NOTE — Consult Note (Addendum)
Chief Complaint: Patient was seen in consultation today for CT guided bone marrow biopsy  Referring Physician(s): [place]  Supervising Physician: Juliet Rude  Patient Status: [facility] - Out-pt  History of Present Illness: Danny [name] is a 66 y.o. male with PMH sig for arthritis, afib, ESRD, B12 deficiency, BPH with bladder outlet obstruction, obesity, chronic ulcer of ankles ,DM, CHF, gout, HTN who presents now with MGUS and normocytic anemia not responding to epogen. He is scheduled today for CT guided bone marrow biopsy for further evaluation.   Past Medical History:  Diagnosis Date   Anemia    Ankle ulcer ([place])    Arthritis    CKD (chronic kidney disease), stage V ([place])    Diabetes mellitus without complication ([place])    Diastolic congestive heart failure (HCC)    Foot ulcer ([place])    Gout    Hypertension    Urinary retention     Past Surgical History:  Procedure Laterality Date   ANKLE SURGERY Right    AV FISTULA PLACEMENT Left 04/26/2021   Procedure: LEFT ARM ARTERIOVENOUS (AV) FISTULA CREATION (BRACHIOCEPHALIC);  Surgeon: Rosetta Posner, MD;  Location: [facility] ORS;  Service: Vascular;  Laterality: Left;   CHOLECYSTECTOMY     FISTULA SUPERFICIALIZATION Left 07/12/2021   Procedure: LEFT ARM ARTERIOVENOUS FISTULA SUPERFICIALIZATION;  Surgeon: Rosetta Posner, MD;  Location: [facility];  Service: Vascular;  Laterality: Left;   FOOT SURGERY Right    INSERTION OF DIALYSIS CATHETER Right 01/14/2021   Procedure: INSERTION OF TUNNELED DIALYSIS CATHETER RIGHT INTERNAL JUGULAR;  Surgeon: Virl Cagey, MD;  Location: [facility];  Service: General;  Laterality: Right;   REMOVAL OF A DIALYSIS CATHETER N/A 09/06/2021   Procedure: MINOR REMOVAL OF TUNNELED DIALYSIS CATHETER;  Surgeon: Rosetta Posner, MD;  Location: [facility];  Service: Vascular;  Laterality: N/A;   TRANSURETHRAL RESECTION OF PROSTATE N/A 01/15/2020   Procedure: TRANSURETHRAL RESECTION OF THE PROSTATE (TURP)  with General  anesthesia and spinal;  Surgeon: Cleon Gustin, MD;  Location: [facility];  Service: Urology;  Laterality: N/A;    Allergies: Dust mite extract, Prednisone, and Rocephin [ceftriaxone]  Medications: Prior to Admission medications   Medication Sig Start Date End Date Taking? Authorizing Provider  apixaban (ELIQUIS) 5 MG TABS tablet Take 1 tablet (5 mg total) by mouth 2 (two) times daily. 01/27/21  Yes Kathie Dike, MD  cholecalciferol (VITAMIN D3) 25 MCG (1000 UNIT) tablet Take 1,000 Units by mouth See admin instructions. Monday's, Wednesday's, Friday's, and Sunday's in the morning,and Tuesday's, Thursday's, and Saturday's at bedtime   Yes [provider]  Cyanocobalamin (B-12 COMPLIANCE INJECTION) 1000 MCG/ML KIT Inject 1,000 mcg as directed every 28 (twenty-eight) days.   Yes [provider]  docusate sodium (COLACE) 100 MG capsule Take 100 mg by mouth 2 (two) times daily.   Yes [provider]  ferrous sulfate 325 (65 FE) MG tablet Take 325 mg by mouth 2 (two) times daily.   Yes [provider]  fluticasone (FLONASE) 50 MCG/ACT nasal spray Place 2 sprays into both nostrils daily. 06/01/21  Yes [provider]  folic acid (FOLVITE) 962 MCG tablet Take 1 tablet (400 mcg total) by mouth daily. 08/29/21  Yes Pennington, Rebekah M, PA-C  Insulin NPH Isophane & Regular (HUMULIN 70/30 KWIKPEN [place]) Inject 5 Units into the skin 2 (two) times daily.   Yes [provider]  Lactulose 20 GM/30ML SOLN Take 30 mLs (20 g total) by mouth daily. Patient  taking differently: Take 20 g by mouth 2 (two) times daily. 11/11/20  Yes [name], MD  metoprolol succinate (TOPROL XL) 50 MG 24 hr tablet Take 1 tablet (50 mg total) by mouth in the morning and at bedtime. Take with or immediately following a meal. 08/15/21 08/15/22 Yes Barton Dubois, MD  midodrine (PROAMATINE) 10 MG tablet Take 10 mg by mouth Every Tuesday,Thursday,and Saturday with dialysis.  08/06/21  Yes [provider]  Multiple Vitamin (MULTIVITAMIN WITH MINERALS) TABS tablet Take 1 tablet by mouth daily.   Yes [provider]  Nutritional Supplements (FEEDING SUPPLEMENT, NEPRO CARB STEADY,) LIQD Take 237 mLs by mouth 2 (two) times daily between meals. 11/05/20  Yes Barton Dubois, MD  Omega-3 Fatty Acids (FISH OIL) 500 MG CAPS Take 500 mg by mouth 2 (two) times daily.   Yes [provider]  ondansetron (ZOFRAN) 4 MG tablet Take 4 mg by mouth every 6 (six) hours as needed for nausea or vomiting.   Yes [provider]  oxycodone (OXY-IR) 5 MG capsule Take 1 capsule (5 mg total) by mouth every 6 (six) hours as needed (severe pain). 10/17/21  Yes Little Ishikawa, MD  polyethylene glycol (MIRALAX / GLYCOLAX) 17 g packet Take 17 g by mouth daily. 01/28/21  Yes Kathie Dike, MD  PROTEIN PO Take 30 mLs by mouth 3 (three) times daily.   Yes [provider]  rosuvastatin (CRESTOR) 5 MG tablet Take 5 mg by mouth every evening.   Yes [provider]  SANTYL 250 UNIT/GM ointment Apply topically. 10/19/21  Yes [provider]  senna (SENOKOT) 8.6 MG tablet Take 2 tablets by mouth 2 (two) times daily.   Yes [provider]  sevelamer carbonate (RENVELA) 800 MG tablet Take 1 tablet (800 mg total) by mouth 2 (two) times daily with a meal. 01/27/21  Yes Memon, Jolaine Artist, MD  sodium phosphate (FLEET) 7-19 GM/118ML ENEM Place 1 enema rectally daily as needed for severe constipation.   Yes [provider]  tamsulosin (FLOMAX) 0.4 MG CAPS capsule Take 1 capsule (0.4 mg total) by mouth daily after supper. 09/09/21  Yes [name], [name], MD  vitamin C (ASCORBIC ACID) 500 MG tablet Take 1,000 mg by mouth 2 (two) times daily.   Yes [provider]  vitamin E 1000 UNIT capsule Take 1,000 Units by mouth See admin instructions. Monday's, Wednesday's, Friday's, and Sunday's in the morning, and Tuesday's, Thursday's, and  Saturday's at bedtime.   Yes [provider]  zinc sulfate 220 (50 Zn) MG capsule Take 220 mg by mouth daily.   Yes [provider]  acetaminophen (TYLENOL) 500 MG tablet Take 1,000 mg by mouth every 6 (six) hours as needed for moderate pain or headache.    [provider]     Family History  Problem Relation Age of Onset   Diabetes Mother    Heart attack Mother    Heart attack Father    Diabetes Brother     Social History   Socioeconomic History   Marital status: Divorced    Spouse name: Not on file   Number of children: Not on file   Years of education: Not on file   Highest education level: Not on file  Occupational History   Not on file  Tobacco Use   Smoking status: Former    Packs/day: 1.50    Years: 10.00    Pack years: 15.00    Types: Cigarettes    Quit date:  06/02/1987    Years since quitting: 34.4   Smokeless tobacco: Never  Vaping Use   Vaping Use: Never used  Substance and Sexual Activity   Alcohol use: Not Currently   Drug use: Never   Sexual activity: Not Currently  Other Topics Concern   Not on file  Social History Narrative   Not on file   Social Determinants of Health   Financial Resource Strain: Not on file  Food Insecurity: Not on file  Transportation Needs: Not on file  Physical Activity: Not on file  Stress: Not on file  Social Connections: Not on file     Review of Systems currently denies fever, headache, chest pain, dyspnea, abdominal/back pain, nausea, vomiting or bleeding.  Does have occasional cough and wounds/ulcers of both lower extremities  Vital Signs: BP (!) 149/96 (BP Location: Right Arm)   Pulse 77   Temp 98.9 F (37.2 C) (Oral)   Resp 15   SpO2 99%   Physical Exam awake, alert.  Chest clear to auscultation bilaterally.  Heart with irregularly irregular rhythm; abdomen obese, soft, positive bowel sounds, nontender.  Pretibial edema bilaterally, ulcers both ankles, venous stasis changes along  with scattered  scabs bilat LE; left upper extremity AV fistula with good thrill/bruit; sacral decubitus noted  Imaging: No results found.  Labs:  CBC: Recent Labs    10/12/21 1327 10/13/21 0404 10/14/21 1524 10/26/21 0823  WBC 7.9 7.7 7.8 6.6  HGB 10.0* 9.3* 8.9* 9.7*  HCT 30.5* 29.4* 26.0* 29.2*  PLT 315 288 254 321    COAGS: Recent Labs    10/12/21 1327  INR 2.1*  APTT 42*    BMP: Recent Labs    08/16/21 1245 10/12/21 1327 10/13/21 0404 10/14/21 1524  NA 134* 135 132* 129*  K 4.0 4.2 4.3 4.8  CL 99 97* 99 95*  CO2 28 27 22 22   GLUCOSE 208* 208* 151* 175*  BUN 48* 58* 61* 75*  CALCIUM 8.3* 8.1* 7.9* 7.9*  CREATININE 4.21* 4.74* 4.73* 6.02*  GFRNONAA 15* 13* 13* 10*    LIVER FUNCTION TESTS: Recent Labs    05/09/21 0942 08/10/21 1045 08/12/21 0938 08/13/21 1456 08/16/21 1245 10/12/21 1327 10/14/21 1524  BILITOT 1.2 0.7 0.8  --   --  1.2  --   AST 42* 19 25  --   --  35  --   ALT 40 16 18  --   --  28  --   ALKPHOS 173* 124 129*  --   --  211*  --   PROT 7.3 6.7 7.2  --   --  6.9  --   ALBUMIN 2.6* 2.3* 2.4* 2.3* 2.2* 2.2* 1.7*    TUMOR MARKERS: No results for input(s): AFPTM, CEA, CA199, CHROMGRNA in the last 8760 hours.  Assessment and Plan: 66 y.o. male with PMH sig for arthritis, afib, ESRD, B12 deficiency, BPH with bladder outlet obstruction, obesity, chronic ulcer of ankles ,DM, CHF, gout, HTN who presents now with MGUS and normocytic anemia not responding to epogen. He is scheduled today for CT guided bone marrow biopsy for further evaluation. Risks and benefits of procedure was discussed with the patient including, but not limited to bleeding, infection, damage to adjacent structures or low yield requiring additional tests.  All of the questions were answered and there is agreement to proceed.  Consent signed and in chart.    Thank you for this interesting consult.  I greatly enjoyed meeting Danny [name] and  look forward to  participating in their care.  A copy of this report was sent to the requesting provider on this date.  Electronically Signed: D. Rowe Robert, PA-C 11/14/2021, 7:53 AM   I spent a total of  20 minutes   in face to face in clinical consultation, greater than 50% of which was counseling/coordinating care for CT guided bone marrow biopsy

## 2021-11-14 NOTE — Discharge Instructions (Addendum)
Please call Interventional Radiology clinic [phone] with any questions or concerns.  You may remove your dressing and shower tomorrow.  Interventional Radiology noted sacral pressure ulcer.  Per IR, sacral dressing placed.  Danny Hammond, RN notified   Bone Marrow Aspiration and Bone Marrow Biopsy, Adult, Care After This sheet gives you information about how to care for yourself after your procedure. Your health care provider may also give you more specific instructions. If you have problems or questions, contact your health care provider. What can I expect after the procedure? After the procedure, it is common to have: Mild pain and tenderness. Swelling. Bruising. Follow these instructions at home: Puncture site care Follow instructions from your health care provider about how to take care of the puncture site. Make sure you: Wash your hands with soap and water before and after you change your bandage (dressing). If soap and water are not available, use hand sanitizer. Change your dressing as told by your health care provider. Check your puncture site every day for signs of infection. Check for: More redness, swelling, or pain. Fluid or blood. Warmth. Pus or a bad smell.   Activity Return to your normal activities as told by your health care provider. Ask your health care provider what activities are safe for you. Do not lift anything that is heavier than 10 lb (4.5 kg), or the limit that you are told, until your health care provider says that it is safe. Do not drive for 24 hours if you were given a sedative during your procedure. General instructions Take over-the-counter and prescription medicines only as told by your health care provider. Do not take baths, swim, or use a hot tub until your health care provider approves. Ask your health care provider if you may take showers. You may only be allowed to take sponge baths. If directed, put ice on the affected area. To do  this: Put ice in a plastic bag. Place a towel between your skin and the bag. Leave the ice on for 20 minutes, 2-3 times a day. Keep all follow-up visits as told by your health care provider. This is important.   Contact a health care provider if: Your pain is not controlled with medicine. You have a fever. You have more redness, swelling, or pain around the puncture site. You have fluid or blood coming from the puncture site. Your puncture site feels warm to the touch. You have pus or a bad smell coming from the puncture site. Summary After the procedure, it is common to have mild pain, tenderness, swelling, and bruising. Follow instructions from your health care provider about how to take care of the puncture site and what activities are safe for you. Take over-the-counter and prescription medicines only as told by your health care provider. Contact a health care provider if you have any signs of infection, such as fluid or blood coming from the puncture site. This information is not intended to replace advice given to you by your health care provider. Make sure you discuss any questions you have with your health care provider. Document Revised: 10/29/2018 Document Reviewed: 10/29/2018 Elsevier Patient Education  2021 [employer].   Moderate Conscious Sedation, Adult, Care After This sheet gives you information about how to care for yourself after your procedure. Your health care provider may also give you more specific instructions. If you have problems or questions, contact your health care provider. What can I expect after the procedure? After the procedure, it is common to  have: Sleepiness for several hours. Impaired judgment for several hours. Difficulty with balance. Vomiting if you eat too soon. Follow these instructions at home: For the time period you were told by your health care provider: Rest. Do not participate in activities where you could fall or become injured. Do  not drive or use machinery. Do not drink alcohol. Do not take sleeping pills or medicines that cause drowsiness. Do not make important decisions or sign legal documents. Do not take care of children on your own.      Eating and drinking Follow the diet recommended by your health care provider. Drink enough fluid to keep your urine pale yellow. If you vomit: Drink water, juice, or soup when you can drink without vomiting. Make sure you have little or no nausea before eating solid foods.   General instructions Take over-the-counter and prescription medicines only as told by your health care provider. Have a responsible adult stay with you for the time you are told. It is important to have someone help care for you until you are awake and alert. Do not smoke. Keep all follow-up visits as told by your health care provider. This is important. Contact a health care provider if: You are still sleepy or having trouble with balance after 24 hours. You feel light-headed. You keep feeling nauseous or you keep vomiting. You develop a rash. You have a fever. You have redness or swelling around the IV site. Get help right away if: You have trouble breathing. You have new-onset confusion at home. Summary After the procedure, it is common to feel sleepy, have impaired judgment, or feel nauseous if you eat too soon. Rest after you get home. Know the things you should not do after the procedure. Follow the diet recommended by your health care provider and drink enough fluid to keep your urine pale yellow. Get help right away if you have trouble breathing or new-onset confusion at home. This information is not intended to replace advice given to you by your health care provider. Make sure you discuss any questions you have with your health care provider. Document Revised: 10/10/2019 Document Reviewed: 05/08/2019 Elsevier Patient Education  2021 [employer].

## 2021-11-14 NOTE — Sedation Documentation (Signed)
Telfa pads and kerlex dressings applied to left and right ankles for bilateral ankle ulcerations. Sacral ulceration covered with sacral silicone wound dressing per PA.

## 2021-11-14 NOTE — Procedures (Signed)
Interventional Radiology Procedure Note  Date of Procedure: 11/14/2021  Procedure: BMBx  Findings:  1. CT BMBx, right posterior ilium    Complications: No immediate complications noted.   Estimated Blood Loss: minimal  Follow-up and Recommendations: 1. Bedrest 1 hour    Albin Felling, MD  Vascular & Interventional Radiology  11/14/2021 9:51 AM

## 2021-11-16 DIAGNOSIS — E669 Obesity, unspecified: Secondary | ICD-10-CM | POA: Diagnosis not present

## 2021-11-16 DIAGNOSIS — I480 Paroxysmal atrial fibrillation: Secondary | ICD-10-CM | POA: Diagnosis not present

## 2021-11-16 DIAGNOSIS — R293 Abnormal posture: Secondary | ICD-10-CM | POA: Diagnosis not present

## 2021-11-16 DIAGNOSIS — E11622 Type 2 diabetes mellitus with other skin ulcer: Secondary | ICD-10-CM | POA: Diagnosis not present

## 2021-11-16 DIAGNOSIS — I4891 Unspecified atrial fibrillation: Secondary | ICD-10-CM | POA: Diagnosis not present

## 2021-11-16 DIAGNOSIS — M6281 Muscle weakness (generalized): Secondary | ICD-10-CM | POA: Diagnosis not present

## 2021-11-16 DIAGNOSIS — L89520 Pressure ulcer of left ankle, unstageable: Secondary | ICD-10-CM | POA: Diagnosis not present

## 2021-11-16 DIAGNOSIS — R2681 Unsteadiness on feet: Secondary | ICD-10-CM | POA: Diagnosis not present

## 2021-11-16 DIAGNOSIS — L89513 Pressure ulcer of right ankle, stage 3: Secondary | ICD-10-CM | POA: Diagnosis not present

## 2021-11-16 LAB — SURGICAL PATHOLOGY

## 2021-11-18 DIAGNOSIS — I480 Paroxysmal atrial fibrillation: Secondary | ICD-10-CM | POA: Diagnosis not present

## 2021-11-18 DIAGNOSIS — M6281 Muscle weakness (generalized): Secondary | ICD-10-CM | POA: Diagnosis not present

## 2021-11-18 DIAGNOSIS — I4891 Unspecified atrial fibrillation: Secondary | ICD-10-CM | POA: Diagnosis not present

## 2021-11-18 DIAGNOSIS — R2681 Unsteadiness on feet: Secondary | ICD-10-CM | POA: Diagnosis not present

## 2021-11-18 DIAGNOSIS — R293 Abnormal posture: Secondary | ICD-10-CM | POA: Diagnosis not present

## 2021-11-21 DIAGNOSIS — R2681 Unsteadiness on feet: Secondary | ICD-10-CM | POA: Diagnosis not present

## 2021-11-21 DIAGNOSIS — R293 Abnormal posture: Secondary | ICD-10-CM | POA: Diagnosis not present

## 2021-11-21 DIAGNOSIS — M6281 Muscle weakness (generalized): Secondary | ICD-10-CM | POA: Diagnosis not present

## 2021-11-21 DIAGNOSIS — I4891 Unspecified atrial fibrillation: Secondary | ICD-10-CM | POA: Diagnosis not present

## 2021-11-21 DIAGNOSIS — I480 Paroxysmal atrial fibrillation: Secondary | ICD-10-CM | POA: Diagnosis not present

## 2021-11-23 ENCOUNTER — Other Ambulatory Visit (HOSPITAL_COMMUNITY): Payer: Medicare Other

## 2021-11-23 ENCOUNTER — Encounter (HOSPITAL_COMMUNITY): Payer: Self-pay | Admitting: Physician Assistant

## 2021-11-23 DIAGNOSIS — M6281 Muscle weakness (generalized): Secondary | ICD-10-CM | POA: Diagnosis not present

## 2021-11-23 DIAGNOSIS — R2681 Unsteadiness on feet: Secondary | ICD-10-CM | POA: Diagnosis not present

## 2021-11-23 DIAGNOSIS — I4891 Unspecified atrial fibrillation: Secondary | ICD-10-CM | POA: Diagnosis not present

## 2021-11-23 DIAGNOSIS — L89513 Pressure ulcer of right ankle, stage 3: Secondary | ICD-10-CM | POA: Diagnosis not present

## 2021-11-23 DIAGNOSIS — L89520 Pressure ulcer of left ankle, unstageable: Secondary | ICD-10-CM | POA: Diagnosis not present

## 2021-11-23 DIAGNOSIS — E11622 Type 2 diabetes mellitus with other skin ulcer: Secondary | ICD-10-CM | POA: Diagnosis not present

## 2021-11-23 DIAGNOSIS — E669 Obesity, unspecified: Secondary | ICD-10-CM | POA: Diagnosis not present

## 2021-11-23 DIAGNOSIS — I480 Paroxysmal atrial fibrillation: Secondary | ICD-10-CM | POA: Diagnosis not present

## 2021-11-23 DIAGNOSIS — R293 Abnormal posture: Secondary | ICD-10-CM | POA: Diagnosis not present

## 2021-11-23 DIAGNOSIS — Z992 Dependence on renal dialysis: Secondary | ICD-10-CM | POA: Diagnosis not present

## 2021-11-23 DIAGNOSIS — N186 End stage renal disease: Secondary | ICD-10-CM | POA: Diagnosis not present

## 2021-11-24 ENCOUNTER — Ambulatory Visit (HOSPITAL_COMMUNITY): Payer: Medicare Other | Admitting: Physician Assistant

## 2021-11-25 DIAGNOSIS — I480 Paroxysmal atrial fibrillation: Secondary | ICD-10-CM | POA: Diagnosis not present

## 2021-11-25 DIAGNOSIS — I4891 Unspecified atrial fibrillation: Secondary | ICD-10-CM | POA: Diagnosis not present

## 2021-11-25 DIAGNOSIS — R2681 Unsteadiness on feet: Secondary | ICD-10-CM | POA: Diagnosis not present

## 2021-11-25 DIAGNOSIS — M6281 Muscle weakness (generalized): Secondary | ICD-10-CM | POA: Diagnosis not present

## 2021-11-25 DIAGNOSIS — R293 Abnormal posture: Secondary | ICD-10-CM | POA: Diagnosis not present

## 2021-11-26 NOTE — Progress Notes (Signed)
[name], [place] [address]   CLINIC:  Medical Oncology/Hematology  PCP:  Celene Squibb, MD [address] [place] [address] [phone]   REASON FOR VISIT:  Follow-up for MGUS and anemia of CKD   PRIOR THERAPY: Retacrit   CURRENT THERAPY: Epogen injections at dialysis  INTERVAL HISTORY:  [name] 66 y.o. male returns for routine follow-up of his anemia of ESRD and MGUS.  He was last seen by Danny Abernethy PA-C on 10/26/2021.  He had bone marrow biopsy on 11/14/2021, and returns today to discuss those results and next steps.  Apart from bone marrow biopsy, he has not had any other medical events or notable changes in his history or symptoms since his last visit.   He remains on Eliquis for atrial fibrillation.  He has history of Hemoccult positive stool, but denies any obvious bleeding such as bright red blood per rectum, melena, or epistaxis. He has an appointment with GI this afternoon to discuss possible EGD and colonoscopy. He continues to report significant fatigue. He reports that he has "always loved ice chips." He denies any restless legs, headaches, lightheadedness, or syncope. He has baseline dyspnea on exertion which is at baseline.  He denies any recent chest pain. He denies any new bone pain, B symptoms, or neurologic changes. He receives dialysis on Tuesdays, Thursdays, and Saturdays. He has  60% energy and  80% appetite. He endorses that he is maintaining a stable weight.   REVIEW OF SYSTEMS:  Review of Systems  Constitutional:  Positive for fatigue. Negative for appetite change, chills, diaphoresis, fever and unexpected weight change.  HENT:   Negative for lump/mass and nosebleeds.   Eyes:  Negative for eye problems.  Respiratory:  Positive for cough (nighttime) and shortness of breath (with exertion). Negative for hemoptysis.   Cardiovascular:  Positive for chest pain. Negative for leg swelling and  palpitations.  Gastrointestinal:  Positive for nausea. Negative for abdominal pain, blood in stool, constipation, diarrhea and vomiting.  Genitourinary:  Negative for hematuria.   Musculoskeletal:  Positive for arthralgias (left knee).  Skin: Negative.   Neurological:  Positive for headaches and numbness. Negative for dizziness and light-headedness.  Hematological:  Does not bruise/bleed easily.     PAST MEDICAL/SURGICAL HISTORY:  Past Medical History:  Diagnosis Date   Anemia    Ankle ulcer ([facility])    Arthritis    CKD (chronic kidney disease), stage V (HCC)    Diabetes mellitus without complication (HCC)    Diastolic congestive heart failure ([facility])    Foot ulcer ([facility])    Gout    Hypertension    Urinary retention    Past Surgical History:  Procedure Laterality Date   ANKLE SURGERY Right    AV FISTULA PLACEMENT Left 04/26/2021   Procedure: LEFT ARM ARTERIOVENOUS (AV) FISTULA CREATION (BRACHIOCEPHALIC);  Surgeon: Rosetta Posner, MD;  Location: [facility];  Service: Vascular;  Laterality: Left;   CHOLECYSTECTOMY     FISTULA SUPERFICIALIZATION Left 07/12/2021   Procedure: LEFT ARM ARTERIOVENOUS FISTULA SUPERFICIALIZATION;  Surgeon: Rosetta Posner, MD;  Location: [facility];  Service: Vascular;  Laterality: Left;   FOOT SURGERY Right    INSERTION OF DIALYSIS CATHETER Right 01/14/2021   Procedure: INSERTION OF TUNNELED DIALYSIS CATHETER RIGHT INTERNAL JUGULAR;  Surgeon: Virl Cagey, MD;  Location: [facility];  Service: General;  Laterality: Right;   REMOVAL OF A DIALYSIS CATHETER N/A 09/06/2021   Procedure: MINOR REMOVAL  OF TUNNELED DIALYSIS CATHETER;  Surgeon: Rosetta Posner, MD;  Location: [facility];  Service: Vascular;  Laterality: N/A;   TRANSURETHRAL RESECTION OF PROSTATE N/A 01/15/2020   Procedure: TRANSURETHRAL RESECTION OF THE PROSTATE (TURP)  with General anesthesia and spinal;  Surgeon: Cleon Gustin, MD;  Location: [facility];  Service: Urology;  Laterality: N/A;     SOCIAL HISTORY:   Social History   Socioeconomic History   Marital status: Divorced    Spouse name: Not on file   Number of children: Not on file   Years of education: Not on file   Highest education level: Not on file  Occupational History   Not on file  Tobacco Use   Smoking status: Former    Packs/day: 1.50    Years: 10.00    Pack years: 15.00    Types: Cigarettes    Quit date: 06/02/1987    Years since quitting: 34.5   Smokeless tobacco: Never  Vaping Use   Vaping Use: Never used  Substance and Sexual Activity   Alcohol use: Not Currently   Drug use: Never   Sexual activity: Not Currently  Other Topics Concern   Not on file  Social History Narrative   Not on file   Social Determinants of Health   Financial Resource Strain: Not on file  Food Insecurity: Not on file  Transportation Needs: Not on file  Physical Activity: Not on file  Stress: Not on file  Social Connections: Not on file  Intimate Partner Violence: Not on file    FAMILY HISTORY:  Family History  Problem Relation Age of Onset   Diabetes Mother    Heart attack Mother    Heart attack Father    Diabetes Brother     CURRENT MEDICATIONS:  Outpatient Encounter Medications as of 11/28/2021  Medication Sig   acetaminophen (TYLENOL) 500 MG tablet Take 1,000 mg by mouth every 6 (six) hours as needed for moderate pain or headache.   apixaban (ELIQUIS) 5 MG TABS tablet Take 1 tablet (5 mg total) by mouth 2 (two) times daily.   cholecalciferol (VITAMIN D3) 25 MCG (1000 UNIT) tablet Take 1,000 Units by mouth See admin instructions. Monday's, Wednesday's, Friday's, and Sunday's in the morning,and Tuesday's, Thursday's, and Saturday's at bedtime   Cyanocobalamin (B-12 COMPLIANCE INJECTION) 1000 MCG/ML KIT Inject 1,000 mcg as directed every 28 (twenty-eight) days.   docusate sodium (COLACE) 100 MG capsule Take 100 mg by mouth 2 (two) times daily.   ferrous sulfate 325 (65 FE) MG tablet Take 325 mg by mouth 2 (two) times daily.    fluticasone (FLONASE) 50 MCG/ACT nasal spray Place 2 sprays into both nostrils daily.   folic acid (FOLVITE) 250 MCG tablet Take 1 tablet (400 mcg total) by mouth daily.   Insulin NPH Isophane & Regular (HUMULIN 70/30 KWIKPEN [place]) Inject 5 Units into the skin 2 (two) times daily.   Lactulose 20 GM/30ML SOLN Take 30 mLs (20 g total) by mouth daily. (Patient taking differently: Take 20 g by mouth 2 (two) times daily.)   metoprolol succinate (TOPROL XL) 50 MG 24 hr tablet Take 1 tablet (50 mg total) by mouth in the morning and at bedtime. Take with or immediately following a meal.   midodrine (PROAMATINE) 10 MG tablet Take 10 mg by mouth Every Tuesday,Thursday,and Saturday with dialysis.   Multiple Vitamin (MULTIVITAMIN WITH MINERALS) TABS tablet Take 1 tablet by mouth daily.   Nutritional Supplements (FEEDING SUPPLEMENT, NEPRO CARB STEADY,) LIQD Take 237  mLs by mouth 2 (two) times daily between meals.   Omega-3 Fatty Acids (FISH OIL) 500 MG CAPS Take 500 mg by mouth 2 (two) times daily.   ondansetron (ZOFRAN) 4 MG tablet Take 4 mg by mouth every 6 (six) hours as needed for nausea or vomiting.   oxycodone (OXY-IR) 5 MG capsule Take 1 capsule (5 mg total) by mouth every 6 (six) hours as needed (severe pain).   polyethylene glycol (MIRALAX / GLYCOLAX) 17 g packet Take 17 g by mouth daily.   PROTEIN PO Take 30 mLs by mouth 3 (three) times daily.   rosuvastatin (CRESTOR) 5 MG tablet Take 5 mg by mouth every evening.   SANTYL 250 UNIT/GM ointment Apply topically.   senna (SENOKOT) 8.6 MG tablet Take 2 tablets by mouth 2 (two) times daily.   sevelamer carbonate (RENVELA) 800 MG tablet Take 1 tablet (800 mg total) by mouth 2 (two) times daily with a meal.   sodium phosphate (FLEET) 7-19 GM/118ML ENEM Place 1 enema rectally daily as needed for severe constipation.   tamsulosin (FLOMAX) 0.4 MG CAPS capsule Take 1 capsule (0.4 mg total) by mouth daily after supper.   vitamin C (ASCORBIC ACID) 500 MG tablet Take  1,000 mg by mouth 2 (two) times daily.   vitamin E 1000 UNIT capsule Take 1,000 Units by mouth See admin instructions. Monday's, Wednesday's, Friday's, and Sunday's in the morning, and Tuesday's, Thursday's, and Saturday's at bedtime.   zinc sulfate 220 (50 Zn) MG capsule Take 220 mg by mouth daily.   No facility-administered encounter medications on file as of 11/28/2021.    ALLERGIES:  Allergies  Allergen Reactions   Dust Mite Extract Itching and Other (See Comments)    Unknown reaction-potential shortness of breath   Prednisone Nausea And Vomiting   Rocephin [Ceftriaxone] Nausea And Vomiting     PHYSICAL EXAM:  ECOG PERFORMANCE STATUS: 3 - Symptomatic, >50% confined to bed  There were no vitals filed for this visit. There were no vitals filed for this visit. Physical Exam Constitutional:      Appearance: Normal appearance. He is obese.     Comments: Somewhat weak appearing.  Presents in wheelchair.  HENT:     Head: Normocephalic and atraumatic.     Mouth/Throat:     Mouth: Mucous membranes are moist.  Eyes:     Extraocular Movements: Extraocular movements intact.     Pupils: Pupils are equal, round, and reactive to light.  Cardiovascular:     Rate and Rhythm: Rhythm irregular.     Pulses: Normal pulses.     Heart sounds: Normal heart sounds.  Pulmonary:     Effort: Pulmonary effort is normal.     Comments: Faint bibasilar crackles Abdominal:     General: Bowel sounds are normal.     Palpations: Abdomen is soft.     Tenderness: There is no abdominal tenderness.  Musculoskeletal:        General: Deformity (left knee effusion) present. No swelling.     Right lower leg: No edema.     Left lower leg: No edema.     Comments: Left knee swollen, without erythema or warmth; appears to have left knee effusion.  Left knee ROM limited.  Lymphadenopathy:     Cervical: No cervical adenopathy.  Skin:    General: Skin is warm and dry.     Comments: Bilateral lower extremity  erythema and warmth, reportedly chronic per patient  Neurological:     General: No  focal deficit present.     Mental Status: He is alert and oriented to person, place, and time.  Psychiatric:        Mood and Affect: Mood normal.        Behavior: Behavior normal.     LABORATORY DATA:  I have reviewed the labs as listed.  CBC    Component Value Date/Time   WBC 7.3 11/14/2021 0756   RBC 3.54 (L) 11/14/2021 0756   HGB 9.6 (L) 11/14/2021 0756   HCT 29.2 (L) 11/14/2021 0756   PLT 259 11/14/2021 0756   MCV 82.5 11/14/2021 0756   MCH 27.1 11/14/2021 0756   MCHC 32.9 11/14/2021 0756   RDW 17.6 (H) 11/14/2021 0756   LYMPHSABS 0.7 11/14/2021 0756   MONOABS 0.8 11/14/2021 0756   EOSABS 0.2 11/14/2021 0756   BASOSABS 0.0 11/14/2021 0756      Latest Ref Rng & Units 10/14/2021    3:24 PM 10/13/2021    4:04 AM 10/12/2021    1:27 PM  CMP  Glucose 70 - 99 mg/dL 175   151   208    BUN 8 - 23 mg/dL 75   61   58    Creatinine 0.61 - 1.24 mg/dL 6.02   4.73   4.74    Sodium 135 - 145 mmol/L 129   132   135    Potassium 3.5 - 5.1 mmol/L 4.8   4.3   4.2    Chloride 98 - 111 mmol/L 95   99   97    CO2 22 - 32 mmol/L 22   22   27     Calcium 8.9 - 10.3 mg/dL 7.9   7.9   8.1    Total Protein 6.5 - 8.1 g/dL   6.9    Total Bilirubin 0.3 - 1.2 mg/dL   1.2    Alkaline Phos 38 - 126 U/L   211    AST 15 - 41 U/L   35    ALT 0 - 44 U/L   28      DIAGNOSTIC IMAGING:  I have independently reviewed the relevant imaging and discussed with the patient.  ASSESSMENT & PLAN: 1.  Normocytic anemia - Multifactorial etiology related to ESRD and relative iron deficiency - No prior history of transfusion.  Has never had an EGD or colonoscopy. - Negative work-up for hemolysis (normal LDH, haptoglobin, reticulocyte count, direct [place]) - Nutritional deficiency work-up showed B12 deficiency (see below), but normal folic acid and copper; normal vitamin D - Patient stopped taking oral iron supplement due to  constipation - Patient is now receiving ESA injections at hemodialysis (Mircera 200 mcg every 2 weeks, equivalent to 20,000 units of Epogen every 2 weeks) - per Dr. Theador Hawthorne, this is the maximum dose of Mircera - Hemoccult stool positive June 2022, negative in March 2023 - Referred to gastroenterology, with upcoming appointment to discuss possible EGD/colonoscopy. - He was noted to have worsening anemia with CBC on 08/10/2021 showing Hgb 6.4/MCV 93.4.  Ferritin and iron saturation were normal.  He was scheduled to receive 2 units PRBC here at the [facility], but on arrival he was noted to have heart rate in the 160s with BP 73/42, therefore was sent to the ED and admitted to the hospital.  He received 2 units PRBC during hospital stay and CBC on 08/16/2021 was 9.1. - He takes Eliquis due to atrial fibrillation. - He denies any gross signs or symptoms of blood loss  such as bright red blood per rectum or melena.  - Most recent labs (10/26/2021): Hgb 9.7/MCV 83.7 ferritin 305, iron saturation 18%.  Adequate B12 and folate. - Bone marrow biopsy (11/14/2021): Normocellular bone marrow for age with trilineage hematopoiesis, slight polyclonal plasmacytosis (plasma cells 8%).  Cytology showed [id number][20] consistent with diagnosis of Klinefelter syndrome (incidental finding of no hematologic significance at this time). --Bone marrow biopsy was nondiagnostic of any underlying bone marrow disorder that would explain patient's refractory anemia.  Suspect anemia of ESRD that is hyporesponsive to ESA.   - PLAN: He continues to have anemia of ESRD hyporesponsive to ESA. We will take over management of patient's ESA at hematology clinic with Retacrit, will start at 40,000 units every 2 weeks, planning to give first dose this Friday pending prior authorization Check labs with next lab draw for PTH and Parvovirus B19 DNA for other possible causes of refractory anemia. Blood loss considered less likely at this time.  Most  recent stool cards were negative, but patient should continue to see GI for follow-up and to reschedule EGD/colonoscopy at their discretion.   - No indication for IV iron supplementation at this time.  Continue oral iron supplementation.   - Monthly CBC with sample to blood bank.   - Follow-up in 3 months   2.  Monoclonal gammopathy of unknown significance, IgG lambda - Work-up for other causes of anemia above revealed immunofixation (09/13/2020) with IgG lambda monoclonal protein; no M spike evident on SPEP; elevated lambda light chains 163.5, elevated kappa light chains 186.6, normal light chain ratio 1.14 - Bone marrow biopsy obtained for work-up of anemia (11/14/2021) revealed slight polyclonal plasmacytosis (plasma cells 8%) - Skeletal survey (11/16/2020): No definite lytic destruction or lucency noted in the visualized skeleton - 24-hour urine study with UPEP/UIFE (08/31/2021): No urine M spike.  Urine immunofixation unremarkable. - Most recent MGUS/myeloma panel (08/10/2021): SPEP negative for M spike.  Elevated light chains (kappa 311, lambda 221) with normal light chain ratio (1.41) in keeping with ESRD on hemodialysis. - No definite CRAB features: Patient does have a markedly elevated creatinine due to longstanding CKD and poorly controlled diabetes/hypertension, anemia thought to be due to anemia of ESRD.  Most recent calcium 8.5 (08/10/2021).  LDH normal.   - He denies any new bone pain or B symptoms    - PLAN: Repeat MGUS panel and skeletal survey in September 2023   3.  B12 and folate deficiency - He was previously on B12 pills, which were not helping - Previous labs (08/10/2021): Persistent B12 deficiency noted with elevated methylmalonic acid 1004 (B12 also elevated at 1777, likely acute phase reactant).  LDH normal.  Folate normal, but homocystine elevated at 19.1. - Patient is currently receiving monthly B12 injections and daily folic acid supplementation since his last appointment -Most  recent labs (10/26/2021), normal folate 15.2.  Elevated vitamin B12 4266.  Elevated homocystine 20.3 with elevated methylmalonic acid 1145.   - Some question as to whether methylmalonic acid and homocystine elevations are due to true vitamin deficiency or renal dysfunction - we we will see if they improve after supplementation - PLAN: Continue O03 and folic acid supplements as above. - Repeat folate, homocystine, B12 and methylmalonic acid in 3 months.   4.  ESRD on hemodialysis - CKD since 2013, secondary to diabetes. - Started on hemodialysis in July 2022 - PLAN: Continue hemodialysis and follow-up with Dr. Theador Hawthorne   All questions were answered. The patient knows to call the clinic  with any problems, questions or concerns.  Medical decision making: Moderate   Time spent on visit: I spent 25 minutes counseling the patient face to face. The total time spent in the appointment was 40 minutes and more than 50% was on counseling.   [name], PA-C  11/28/21 2:36 PM

## 2021-11-28 ENCOUNTER — Ambulatory Visit (INDEPENDENT_AMBULATORY_CARE_PROVIDER_SITE_OTHER): Payer: Medicare Other | Admitting: Gastroenterology

## 2021-11-28 ENCOUNTER — Encounter (HOSPITAL_COMMUNITY): Payer: Self-pay | Admitting: Physician Assistant

## 2021-11-28 ENCOUNTER — Inpatient Hospital Stay (HOSPITAL_COMMUNITY): Payer: Medicare Other | Attending: Physician Assistant | Admitting: Physician Assistant

## 2021-11-28 ENCOUNTER — Encounter (INDEPENDENT_AMBULATORY_CARE_PROVIDER_SITE_OTHER): Payer: Self-pay | Admitting: Gastroenterology

## 2021-11-28 VITALS — BP 122/76 | HR 91 | Temp 98.7°F | Ht 73.0 in | Wt 245.0 lb

## 2021-11-28 VITALS — BP 101/56 | HR 78 | Temp 98.1°F | Resp 18

## 2021-11-28 DIAGNOSIS — I5032 Chronic diastolic (congestive) heart failure: Secondary | ICD-10-CM | POA: Diagnosis not present

## 2021-11-28 DIAGNOSIS — R143 Flatulence: Secondary | ICD-10-CM | POA: Diagnosis not present

## 2021-11-28 DIAGNOSIS — D509 Iron deficiency anemia, unspecified: Secondary | ICD-10-CM

## 2021-11-28 DIAGNOSIS — R2681 Unsteadiness on feet: Secondary | ICD-10-CM | POA: Diagnosis not present

## 2021-11-28 DIAGNOSIS — L89153 Pressure ulcer of sacral region, stage 3: Secondary | ICD-10-CM | POA: Diagnosis not present

## 2021-11-28 DIAGNOSIS — L89513 Pressure ulcer of right ankle, stage 3: Secondary | ICD-10-CM | POA: Diagnosis not present

## 2021-11-28 DIAGNOSIS — I4891 Unspecified atrial fibrillation: Secondary | ICD-10-CM | POA: Diagnosis not present

## 2021-11-28 DIAGNOSIS — K5900 Constipation, unspecified: Secondary | ICD-10-CM | POA: Diagnosis not present

## 2021-11-28 DIAGNOSIS — E538 Deficiency of other specified B group vitamins: Secondary | ICD-10-CM | POA: Diagnosis not present

## 2021-11-28 DIAGNOSIS — Z992 Dependence on renal dialysis: Secondary | ICD-10-CM | POA: Insufficient documentation

## 2021-11-28 DIAGNOSIS — E1122 Type 2 diabetes mellitus with diabetic chronic kidney disease: Secondary | ICD-10-CM | POA: Insufficient documentation

## 2021-11-28 DIAGNOSIS — I132 Hypertensive heart and chronic kidney disease with heart failure and with stage 5 chronic kidney disease, or end stage renal disease: Secondary | ICD-10-CM | POA: Diagnosis not present

## 2021-11-28 DIAGNOSIS — D472 Monoclonal gammopathy: Secondary | ICD-10-CM | POA: Diagnosis not present

## 2021-11-28 DIAGNOSIS — D508 Other iron deficiency anemias: Secondary | ICD-10-CM | POA: Diagnosis not present

## 2021-11-28 DIAGNOSIS — L89523 Pressure ulcer of left ankle, stage 3: Secondary | ICD-10-CM | POA: Diagnosis not present

## 2021-11-28 DIAGNOSIS — N186 End stage renal disease: Secondary | ICD-10-CM | POA: Diagnosis not present

## 2021-11-28 DIAGNOSIS — D631 Anemia in chronic kidney disease: Secondary | ICD-10-CM | POA: Insufficient documentation

## 2021-11-28 DIAGNOSIS — Z7901 Long term (current) use of anticoagulants: Secondary | ICD-10-CM | POA: Diagnosis not present

## 2021-11-28 DIAGNOSIS — L98492 Non-pressure chronic ulcer of skin of other sites with fat layer exposed: Secondary | ICD-10-CM | POA: Diagnosis not present

## 2021-11-28 DIAGNOSIS — M6281 Muscle weakness (generalized): Secondary | ICD-10-CM | POA: Diagnosis not present

## 2021-11-28 DIAGNOSIS — R293 Abnormal posture: Secondary | ICD-10-CM | POA: Diagnosis not present

## 2021-11-28 DIAGNOSIS — I480 Paroxysmal atrial fibrillation: Secondary | ICD-10-CM | POA: Diagnosis not present

## 2021-11-28 HISTORY — DX: Anemia in chronic kidney disease: D63.1

## 2021-11-28 NOTE — Progress Notes (Unsigned)
Referring Provider: Celene Squibb, MD Primary Care Physician:  Celene Squibb, MD Primary GI Physician:   Chief Complaint  Patient presents with   Anemia    Would like to schedule TCS and EGD for anemia. He was seen last October and has not had procedures done yet.    HPI:   Danny [name] is a 66 y.o. male with past medical history of ESRD on dialysis, anemia, arthritis, DM, CHF, HTN.   Patient presenting today for anemia and to get EGD/Colonoscopy scheduled  History: Last seen October 2022 for anemia, at that time he was seeing [facility] with straining, having bloating and taking miralax PRN. He was referrered by  hematology for worsening anemia and positive FOBT x3 in June 2022,  Upon review of his medical record, he was noted to have a decline in his hemoglobin since 2020, with a hemoglobin that fluctuated between 9.9 and 7.2.  Had not required any blood transfusions. MCV has steadily remained above 80 through the years. However, his iron  has fluctuated between the low 30s and mid 50s, with a normal ferritin through the years.  iron saturation on the low side with values fluctuating between 11 and 19.  Folic acid has been normal above 14.   blood work from Nov 07, 2020 showed iron of 45, saturation of 19%, ferritin increased to 377, TIBC 242.  CBC on 02/25/2021 showed a hemoglobin of 10.9, platelets of 212 and white cell count of 12.2. patient was scheduled for EGD and colonoscopy at that time, hwoever, these were not completed.   Present: Most recent labs with hgb 9.6 on 11/14/21, last iron studies on 10/25/21 with iron 29, TIBC 162, saturation 18 and ferritin 305. Saw hematology this morning. Per note patient stopped oral iron due to constipation, however, patietn tells me he is unsure why it was stopped. He was noted to have worsening anemia with CBC on 08/10/2021 showing Hgb 6.4/MCV 93.4.  Ferritin and iron saturation were normal.  He was scheduled to receive 2 units PRBC at the [facility], but  on arrival he was noted to have heart rate in the 160s with BP 73/42, was sent to the ED and admitted to the hospital.  He received 2 units PRBC during hospital stay with hgb improvement to 9.1 on 08/16/21. Notably had negative FOBT in March 2023.   Patient states that colonoscopy and EGD was scheduled in Oct but he was not given his colonoscopy prep until late the evening before by nursing staff at the facility at which he resides and he refused to do it over night as he knew he would not have enough time to complete it. He reports that he is very fatigued and feels that energy levels are very low. He denies any rectal bleeding or melena. Reports that he has some abdominal pain at times, feels that this is mostly related to gas as he feels that he has a lot.appetite is good and weight is stable. He is unsure why he seems to be having more gas, he reports that he is on a diabetic/dialysis diet. He eats a diet high in chicken, broccoli, green veggies, rice. He states that nursing staff will give him something for gas if he is having a lot of issues with it. He does endorse occasional nausea, has had one episode of vomiting a while back but is unsure what precipitated this. He denies obvious issues with GERD. He reports having a BM once every 3  days, is on colace twice daily, lactulose 20g BID and miralax 1 capful/day. He states that if he feels he is getting very constipated, he will request 1/2 dose of milk of magnesia which will help him move his bowels without so much diarrhea or GI upset. Denies any issues with dysphagia or odynophagia.   Last Colonoscopy:>10 years ago, polyps per patient, no report available Last Endoscopy:never  Recommendations:    Past Medical History:  Diagnosis Date   Anemia    Anemia in ESRD (end-stage renal disease) ([place]) 11/28/2021   Ankle ulcer ([place])    Arthritis    CKD (chronic kidney disease), stage V (HCC)    Diabetes mellitus without complication (HCC)    Diastolic  congestive heart failure ([place])    Foot ulcer ([place])    Gout    Hypertension    Urinary retention     Past Surgical History:  Procedure Laterality Date   ANKLE SURGERY Right    AV FISTULA PLACEMENT Left 04/26/2021   Procedure: LEFT ARM ARTERIOVENOUS (AV) FISTULA CREATION (BRACHIOCEPHALIC);  Surgeon: Rosetta Posner, MD;  Location: [facility];  Service: Vascular;  Laterality: Left;   CHOLECYSTECTOMY     FISTULA SUPERFICIALIZATION Left 07/12/2021   Procedure: LEFT ARM ARTERIOVENOUS FISTULA SUPERFICIALIZATION;  Surgeon: Rosetta Posner, MD;  Location: [facility];  Service: Vascular;  Laterality: Left;   FOOT SURGERY Right    INSERTION OF DIALYSIS CATHETER Right 01/14/2021   Procedure: INSERTION OF TUNNELED DIALYSIS CATHETER RIGHT INTERNAL JUGULAR;  Surgeon: Virl Cagey, MD;  Location: [facility];  Service: General;  Laterality: Right;   REMOVAL OF A DIALYSIS CATHETER N/A 09/06/2021   Procedure: MINOR REMOVAL OF TUNNELED DIALYSIS CATHETER;  Surgeon: Rosetta Posner, MD;  Location: [facility];  Service: Vascular;  Laterality: N/A;   TRANSURETHRAL RESECTION OF PROSTATE N/A 01/15/2020   Procedure: TRANSURETHRAL RESECTION OF THE PROSTATE (TURP)  with General anesthesia and spinal;  Surgeon: Cleon Gustin, MD;  Location: [facility];  Service: Urology;  Laterality: N/A;    Current Outpatient Medications  Medication Sig Dispense Refill   acetaminophen (TYLENOL) 500 MG tablet Take 1,000 mg by mouth every 6 (six) hours as needed for moderate pain or headache.     apixaban (ELIQUIS) 5 MG TABS tablet Take 1 tablet (5 mg total) by mouth 2 (two) times daily. 60 tablet    cholecalciferol (VITAMIN D3) 25 MCG (1000 UNIT) tablet Take 1,000 Units by mouth See admin instructions. Monday's, Wednesday's, Friday's, and Sunday's in the morning,and Tuesday's, Thursday's, and Saturday's at bedtime     Cyanocobalamin (B-12 COMPLIANCE INJECTION) 1000 MCG/ML KIT Inject 1,000 mcg as directed every 28 (twenty-eight) days.     docusate  sodium (COLACE) 100 MG capsule Take 100 mg by mouth 2 (two) times daily.     ferrous sulfate 325 (65 FE) MG tablet Take 325 mg by mouth 2 (two) times daily.     fluticasone (FLONASE) 50 MCG/ACT nasal spray Place 2 sprays into both nostrils daily.     folic acid (FOLVITE) 785 MCG tablet Take 1 tablet (400 mcg total) by mouth daily. 90 tablet 3   Insulin NPH Isophane & Regular (HUMULIN 70/30 KWIKPEN [place]) Inject 5 Units into the skin 2 (two) times daily.     Lactulose 20 GM/30ML SOLN Take 30 mLs (20 g total) by mouth daily. (Patient taking differently: Take 20 g by mouth 2 (two) times daily.) 450 mL 1   metoprolol succinate (TOPROL XL) 50 MG 24 hr  tablet Take 1 tablet (50 mg total) by mouth in the morning and at bedtime. Take with or immediately following a meal. 60 tablet 3   midodrine (PROAMATINE) 10 MG tablet Take 10 mg by mouth Every Tuesday,Thursday,and Saturday with dialysis.     Multiple Vitamin (MULTIVITAMIN WITH MINERALS) TABS tablet Take 1 tablet by mouth daily.     Omega-3 Fatty Acids (FISH OIL) 500 MG CAPS Take 500 mg by mouth 2 (two) times daily.     ondansetron (ZOFRAN) 4 MG tablet Take 4 mg by mouth every 6 (six) hours as needed for nausea or vomiting.     oxycodone (OXY-IR) 5 MG capsule Take 1 capsule (5 mg total) by mouth every 6 (six) hours as needed (severe pain). 15 capsule 0   polyethylene glycol (MIRALAX / GLYCOLAX) 17 g packet Take 17 g by mouth daily. 14 each 0   rosuvastatin (CRESTOR) 5 MG tablet Take 5 mg by mouth every evening.     SANTYL 250 UNIT/GM ointment Apply topically.     senna (SENOKOT) 8.6 MG tablet Take 2 tablets by mouth 2 (two) times daily.     sevelamer carbonate (RENVELA) 800 MG tablet Take 1 tablet (800 mg total) by mouth 2 (two) times daily with a meal.     sodium phosphate (FLEET) 7-19 GM/118ML ENEM Place 1 enema rectally daily as needed for severe constipation.     tamsulosin (FLOMAX) 0.4 MG CAPS capsule Take 1 capsule (0.4 mg total) by mouth daily after  supper. 30 capsule 11   vitamin C (ASCORBIC ACID) 500 MG tablet Take 1,000 mg by mouth 2 (two) times daily.     vitamin E 1000 UNIT capsule Take 1,000 Units by mouth See admin instructions. Monday's, Wednesday's, Friday's, and Sunday's in the morning, and Tuesday's, Thursday's, and Saturday's at bedtime.     zinc sulfate 220 (50 Zn) MG capsule Take 220 mg by mouth daily.     Nutritional Supplements (FEEDING SUPPLEMENT, NEPRO CARB STEADY,) LIQD Take 237 mLs by mouth 2 (two) times daily between meals.  0   PROTEIN PO Take 30 mLs by mouth 3 (three) times daily.     No current facility-administered medications for this visit.    Allergies as of 11/28/2021 - Review Complete 11/28/2021  Allergen Reaction Noted   Dust mite extract Itching and Other (See Comments) 01/20/2018   Prednisone Nausea And Vomiting 01/20/2018   Rocephin [ceftriaxone] Nausea And Vomiting 01/20/2018    Family History  Problem Relation Age of Onset   Diabetes Mother    Heart attack Mother    Heart attack Father    Diabetes Brother     Social History   Socioeconomic History   Marital status: Divorced    Spouse name: Not on file   Number of children: Not on file   Years of education: Not on file   Highest education level: Not on file  Occupational History   Not on file  Tobacco Use   Smoking status: Former    Packs/day: 1.50    Years: 10.00    Pack years: 15.00    Types: Cigarettes    Quit date: 06/02/1987    Years since quitting: 34.5    Passive exposure: Past   Smokeless tobacco: Never  Vaping Use   Vaping Use: Never used  Substance and Sexual Activity   Alcohol use: Not Currently   Drug use: Never   Sexual activity: Not Currently  Other Topics Concern   Not on file  Social History Narrative   Not on file   Social Determinants of Health   Financial Resource Strain: Not on file  Food Insecurity: Not on file  Transportation Needs: Not on file  Physical Activity: Not on file  Stress: Not on  file  Social Connections: Not on file   Review of systems General: negative for night sweats, fever, chills, weight loss +fatigue Neck: Negative for lumps, goiter, pain and significant neck swelling Resp: Negative for cough, wheezing, dyspnea at rest CV: Negative for chest pain, leg swelling, palpitations, orthopnea GI: denies melena, hematochezia, nausea, vomiting, diarrhea,  dysphagia, odyonophagia, early satiety or unintentional weight loss. +constipation +gas pain MSK: Negative for joint pain or swelling, back pain, and muscle pain. Derm: Negative for itching or rash Psych: Denies depression, anxiety, memory loss, confusion. No homicidal or suicidal ideation.  Heme: Negative for prolonged bleeding, bruising easily, and swollen nodes. Endocrine: Negative for cold or heat intolerance, polyuria, polydipsia and goiter. Neuro: negative for tremor, gait imbalance, syncope and seizures. The remainder of the review of systems is noncontributory.  Physical Exam: BP 122/76 (BP Location: Right Arm, Patient Position: Sitting, Cuff Size: Large)   Pulse 91   Temp 98.7 F (37.1 C) (Oral)   Ht 6' 1" (1.854 m)   Wt 245 lb (111.1 kg)   BMI 32.32 kg/m  General:   Alert and oriented. No distress noted. Pleasant and cooperative.  Head:  Normocephalic and atraumatic. Eyes:  Conjuctiva clear without scleral icterus. Mouth:  Oral mucosa pink and moist. Good dentition. No lesions. Heart: Normal rate and rhythm, s1 and s2 heart sounds present.  Lungs: Clear lung sounds in all lobes. Respirations equal and unlabored. Abdomen:  +BS, soft, non-tender and non-distended. No rebound or guarding. No HSM or masses noted. Derm: No palmar erythema or jaundice Msk:  Symmetrical without gross deformities. Normal posture. Extremities:  Without edema. Neurologic:  Alert and  oriented x4 Psych:  Alert and cooperative. Normal mood and affect.  Invalid input(s): 6 MONTHS   ASSESSMENT: Danny [name] is a 66 y.o.  male presenting today for IDA and to get EGD and colonoscopy rescheduled.   Most recent labs with hgb 9.6 on 11/14/21, last iron studies on 10/25/21 with iron 29, TIBC 162, saturation 18 and ferritin 305. FOBT negative in march 2023, previously positive in June 2022, did require 2 units PRBCs in February for worsening anemia with hgb 6.4 he denies overt GI bleeding but endorses fatigue. Pt had stopped oral iron due to constipation, however, was recommended to restart this by hematology with plan to get Retacrit 40,000 units Q2 weeks. We will get patient rescheduled for EGD and Colonoscopy for further evaluation of ongoing IDA, though likely from combination of ESRD and relative iron deficiency, he will need evaluation for GI losses, especially given requirement for 2 units PRBCs in February. Indications, risks and benefits of procedure discussed in detail with patient. Patient verbalized understanding and is in agreement to proceed with EGD/Colonoscopy at this time.   He feels that constipation is well controlled on current regimen of colace, miralax and lactulose, having a Bm every 3 days, though he denies the need to strain, can continue this regimen with good results.   Flatulence/gas pains are likely related to dietary intake as he eats a diet high in cruciferous veggies such as broccoli. I discussed higher prevalence of gas with certain foods. Patient can use gas x or phazyme PRN for discomfort.    PLAN:  EGD/Colonoscopy-endo 3, two  day prep ( pt on eliquis for a fib) 2. Gas x or phazyme for flatulence 3. Continue to follow with hematology  4. Continue current constipation regimen.   All questions were answered, patient verbalized understanding and is in agreement with plan as outlined above.    Follow Up: 4 months   L. Alver Sorrow, MSN, APRN, AGNP-C Adult-Gerontology Nurse Practitioner [facility]

## 2021-11-28 NOTE — Patient Instructions (Signed)
It was nice to meet you  We will get you scheduled for EGD and Colonoscopy for further evaluation of your ongoing iron deficiency anemia. Be mindful that certain foods, especially certain veggies like broccoli, cauliflower can cause worsening gas, you can try something like gas x or phazyme if nursing facility is able to provide this for you   Follow up in 4 months

## 2021-11-28 NOTE — Patient Instructions (Signed)
[place] Discharge Instructions  You were seen today by Tarri Abernethy PA-C for your anemia and your MGUS.    ANEMIA: Your blood levels remain low, but your bone marrow biopsy did not show any signs of bone marrow or blood cancer.  Therefore, we will increase the amount of "blood builder shots" (Retacrit) that you are receiving.  Rather than receiving these shots with dialysis, you will receive them at our clinic instead. We will check your blood counts and give Retacrit injection every 2 weeks on Fridays.  We will schedule you for your first labs and injection this Friday, 12/02/2021. Continue B12 injections once a month (instead of once daily pill) Continue taking folic acid 828 mcg daily. Continue follow-up with gastroenterology (Dr. Jenetta Downer) to complete the EGD and colonoscopy that were canceled in October 2022. Follow-up visit in 3 months.    MGUS: Your MGUS-protein levels were stable when they were last checked.  We will check them again around September/October 2023.  You also need to check whole-body x-ray (skeletal survey) at that time.  FOLLOW-UP APPOINTMENT: Follow-up visit in 3 months   Thank you for choosing [place] to provide your oncology and hematology care.  To afford each patient quality time with our provider, please arrive at least 15 minutes before your scheduled appointment time.   If you have a lab appointment with the [facility] please come in thru the Main Entrance and check in at the main information desk.  You need to re-schedule your appointment should you arrive 10 or more minutes late.  We strive to give you quality time with our providers, and arriving late affects you and other patients whose appointments are after yours.  Also, if you no show three or more times for appointments you may be dismissed from the clinic at the providers discretion.     Again, thank you for  choosing [facility].  Our hope is that these requests will decrease the amount of time that you wait before being seen by our physicians.       _____________________________________________________________  Should you have questions after your visit to [facility], please contact our office at [phone] and follow the prompts.  Our office hours are 8:00 a.m. and 4:30 p.m. Monday - Friday.  Please note that voicemails left after 4:00 p.m. may not be returned until the following business day.  We are closed weekends and major holidays.  You do have access to a nurse 24-7, just call the main number to the clinic [phone] and do not press any options, hold on the line and a nurse will answer the phone.    For prescription refill requests, have your pharmacy contact our office and allow 72 hours.    Due to Covid, you will need to wear a mask upon entering the hospital. If you do not have a mask, a mask will be given to you at the Main Entrance upon arrival. For doctor visits, patients may have 1 support person age 23 or older with them. For treatment visits, patients can not have anyone with them due to social distancing guidelines and our immunocompromised population.

## 2021-11-29 ENCOUNTER — Encounter (INDEPENDENT_AMBULATORY_CARE_PROVIDER_SITE_OTHER): Payer: Self-pay | Admitting: Gastroenterology

## 2021-11-29 DIAGNOSIS — K5900 Constipation, unspecified: Secondary | ICD-10-CM | POA: Insufficient documentation

## 2021-11-29 DIAGNOSIS — R143 Flatulence: Secondary | ICD-10-CM | POA: Insufficient documentation

## 2021-11-30 ENCOUNTER — Telehealth (INDEPENDENT_AMBULATORY_CARE_PROVIDER_SITE_OTHER): Payer: Self-pay

## 2021-11-30 ENCOUNTER — Encounter (INDEPENDENT_AMBULATORY_CARE_PROVIDER_SITE_OTHER): Payer: Self-pay

## 2021-11-30 ENCOUNTER — Other Ambulatory Visit (INDEPENDENT_AMBULATORY_CARE_PROVIDER_SITE_OTHER): Payer: Self-pay

## 2021-11-30 DIAGNOSIS — M6281 Muscle weakness (generalized): Secondary | ICD-10-CM | POA: Diagnosis not present

## 2021-11-30 DIAGNOSIS — R2681 Unsteadiness on feet: Secondary | ICD-10-CM | POA: Diagnosis not present

## 2021-11-30 DIAGNOSIS — M25562 Pain in left knee: Secondary | ICD-10-CM | POA: Diagnosis not present

## 2021-11-30 DIAGNOSIS — Z96652 Presence of left artificial knee joint: Secondary | ICD-10-CM | POA: Diagnosis not present

## 2021-11-30 DIAGNOSIS — I480 Paroxysmal atrial fibrillation: Secondary | ICD-10-CM | POA: Diagnosis not present

## 2021-11-30 DIAGNOSIS — E11622 Type 2 diabetes mellitus with other skin ulcer: Secondary | ICD-10-CM | POA: Diagnosis not present

## 2021-11-30 DIAGNOSIS — I4891 Unspecified atrial fibrillation: Secondary | ICD-10-CM | POA: Diagnosis not present

## 2021-11-30 DIAGNOSIS — R293 Abnormal posture: Secondary | ICD-10-CM | POA: Diagnosis not present

## 2021-11-30 DIAGNOSIS — L89520 Pressure ulcer of left ankle, unstageable: Secondary | ICD-10-CM | POA: Diagnosis not present

## 2021-11-30 DIAGNOSIS — E669 Obesity, unspecified: Secondary | ICD-10-CM | POA: Diagnosis not present

## 2021-11-30 DIAGNOSIS — L89513 Pressure ulcer of right ankle, stage 3: Secondary | ICD-10-CM | POA: Diagnosis not present

## 2021-11-30 DIAGNOSIS — D509 Iron deficiency anemia, unspecified: Secondary | ICD-10-CM

## 2021-11-30 MED ORDER — PEG 3350-KCL-NA BICARB-NACL 420 G PO SOLR: 4000.0000 mL | ORAL | 0 refills | Status: DC

## 2021-11-30 NOTE — Telephone Encounter (Signed)
Danny [name] Ann Alexande Sheerin, CMA  ?

## 2021-12-01 ENCOUNTER — Telehealth (INDEPENDENT_AMBULATORY_CARE_PROVIDER_SITE_OTHER): Payer: Self-pay

## 2021-12-01 MED ORDER — PEG 3350-KCL-NA BICARB-NACL 420 G PO SOLR: 4000.0000 mL | ORAL | 0 refills | Status: DC

## 2021-12-01 NOTE — Telephone Encounter (Signed)
Manav Pierotti Ann Candyce Gambino, CMA  ?

## 2021-12-02 ENCOUNTER — Encounter (HOSPITAL_COMMUNITY): Payer: Self-pay

## 2021-12-02 ENCOUNTER — Inpatient Hospital Stay (HOSPITAL_COMMUNITY): Payer: Medicare Other

## 2021-12-02 VITALS — BP 145/90 | HR 94 | Temp 98.0°F | Resp 18

## 2021-12-02 DIAGNOSIS — M25562 Pain in left knee: Secondary | ICD-10-CM | POA: Diagnosis not present

## 2021-12-02 DIAGNOSIS — D472 Monoclonal gammopathy: Secondary | ICD-10-CM | POA: Diagnosis not present

## 2021-12-02 DIAGNOSIS — D464 Refractory anemia, unspecified: Secondary | ICD-10-CM

## 2021-12-02 DIAGNOSIS — N186 End stage renal disease: Secondary | ICD-10-CM

## 2021-12-02 DIAGNOSIS — D508 Other iron deficiency anemias: Secondary | ICD-10-CM

## 2021-12-02 LAB — SAMPLE TO BLOOD BANK

## 2021-12-02 LAB — CBC
HCT: 27.6 % — ABNORMAL LOW (ref 39.0–52.0)
Hemoglobin: 8.7 g/dL — ABNORMAL LOW (ref 13.0–17.0)
MCH: 26.5 pg (ref 26.0–34.0)
MCHC: 31.5 g/dL (ref 30.0–36.0)
MCV: 84.1 fL (ref 80.0–100.0)
Platelets: 320 10*3/uL (ref 150–400)
RBC: 3.28 MIL/uL — ABNORMAL LOW (ref 4.22–5.81)
RDW: 18.6 % — ABNORMAL HIGH (ref 11.5–15.5)
WBC: 7.5 10*3/uL (ref 4.0–10.5)
nRBC: 0 % (ref 0.0–0.2)

## 2021-12-02 MED ORDER — EPOETIN ALFA-EPBX 40000 UNIT/ML IJ SOLN
40000.0000 [IU] | Freq: Once | INTRAMUSCULAR | Status: AC
  Administered 2021-12-02: 40000 [IU] via SUBCUTANEOUS
  Filled 2021-12-02: qty 1

## 2021-12-02 NOTE — Patient Instructions (Signed)
[place]  Discharge Instructions: Thank you for choosing [facility] to provide your oncology and hematology care.  If you have a lab appointment with the [facility], please come in thru the Main Entrance and check in at the main information desk.  Wear comfortable clothing and clothing appropriate for easy access to any Portacath or PICC line.   We strive to give you quality time with your provider. You may need to reschedule your appointment if you arrive late (15 or more minutes).  Arriving late affects you and other patients whose appointments are after yours.  Also, if you miss three or more appointments without notifying the office, you may be dismissed from the clinic at the provider's discretion.      For prescription refill requests, have your pharmacy contact our office and allow 72 hours for refills to be completed.    Today you received the following chemotherapy and/or immunotherapy agents Retacrit.  Epoetin Alfa injection What is this medication? EPOETIN ALFA (e POE e tin AL fa) helps your body make more red blood cells. This medicine is used to treat anemia caused by chronic kidney disease, cancer chemotherapy, or HIV-therapy. It may also be used before surgery if you have anemia. This medicine may be used for other purposes; ask your health care provider or pharmacist if you have questions. COMMON BRAND NAME(S): Epogen, Procrit, Retacrit What should I tell my care team before I take this medication? They need to know if you have any of these conditions: cancer heart disease high blood pressure history of blood clots history of stroke low levels of folate, iron, or vitamin B12 in the blood seizures an unusual or allergic reaction to erythropoietin, albumin, benzyl alcohol, hamster proteins, other medicines, foods, dyes, or preservatives pregnant or trying to get pregnant breast-feeding How should I use this medication? This medicine is for  injection into a vein or under the skin. It is usually given by a health care professional in a hospital or clinic setting. If you get this medicine at home, you will be taught how to prepare and give this medicine. Use exactly as directed. Take your medicine at regular intervals. Do not take your medicine more often than directed. It is important that you put your used needles and syringes in a special sharps container. Do not put them in a trash can. If you do not have a sharps container, call your pharmacist or healthcare provider to get one. A special MedGuide will be given to you by the pharmacist with each prescription and refill. Be sure to read this information carefully each time. Talk to your pediatrician regarding the use of this medicine in children. While this drug may be prescribed for selected conditions, precautions do apply. Overdosage: If you think you have taken too much of this medicine contact a poison control center or emergency room at once. NOTE: This medicine is only for you. Do not share this medicine with others. What if I miss a dose? If you miss a dose, take it as soon as you can. If it is almost time for your next dose, take only that dose. Do not take double or extra doses. What may interact with this medication? Interactions have not been studied. This list may not describe all possible interactions. Give your health care provider a list of all the medicines, herbs, non-prescription drugs, or dietary supplements you use. Also tell them if you smoke, drink alcohol, or use illegal drugs. Some  items may interact with your medicine. What should I watch for while using this medication? Your condition will be monitored carefully while you are receiving this medicine. You may need blood work done while you are taking this medicine. This medicine may cause a decrease in vitamin B6. You should make sure that you get enough vitamin B6 while you are taking this medicine. Discuss  the foods you eat and the vitamins you take with your health care professional. What side effects may I notice from receiving this medication? Side effects that you should report to your doctor or health care professional as soon as possible: allergic reactions like skin rash, itching or hives, swelling of the face, lips, or tongue seizures signs and symptoms of a blood clot such as breathing problems; changes in vision; chest pain; severe, sudden headache; pain, swelling, warmth in the leg; trouble speaking; sudden numbness or weakness of the face, arm or leg signs and symptoms of a stroke like changes in vision; confusion; trouble speaking or understanding; severe headaches; sudden numbness or weakness of the face, arm or leg; trouble walking; dizziness; loss of balance or coordination Side effects that usually do not require medical attention (report to your doctor or health care professional if they continue or are bothersome): chills cough dizziness fever headaches joint pain muscle cramps muscle pain nausea, vomiting pain, redness, or irritation at site where injected This list may not describe all possible side effects. Call your doctor for medical advice about side effects. You may report side effects to FDA at 1-800-FDA-1088. Where should I keep my medication? Keep out of the reach of children. Store in a refrigerator between 2 and 8 degrees C (36 and 46 degrees F). Do not freeze or shake. Throw away any unused portion if using a single-dose vial. Multi-dose vials can be kept in the refrigerator for up to 21 days after the initial dose. Throw away unused medicine. NOTE: This sheet is a summary. It may not cover all possible information. If you have questions about this medicine, talk to your doctor, pharmacist, or health care provider.  2023 Elsevier/Gold Standard (2017-02-13 00:00:00)         To help prevent nausea and vomiting after your treatment, we encourage you to take  your nausea medication as directed.  BELOW ARE SYMPTOMS THAT SHOULD BE REPORTED IMMEDIATELY: *FEVER GREATER THAN 100.4 F (38 C) OR HIGHER *CHILLS OR SWEATING *NAUSEA AND VOMITING THAT IS NOT CONTROLLED WITH YOUR NAUSEA MEDICATION *UNUSUAL SHORTNESS OF BREATH *UNUSUAL BRUISING OR BLEEDING *URINARY PROBLEMS (pain or burning when urinating, or frequent urination) *BOWEL PROBLEMS (unusual diarrhea, constipation, pain near the anus) TENDERNESS IN MOUTH AND THROAT WITH OR WITHOUT PRESENCE OF ULCERS (sore throat, sores in mouth, or a toothache) UNUSUAL RASH, SWELLING OR PAIN  UNUSUAL VAGINAL DISCHARGE OR ITCHING   Items with * indicate a potential emergency and should be followed up as soon as possible or go to the Emergency Department if any problems should occur.  Please show the CHEMOTHERAPY ALERT CARD or IMMUNOTHERAPY ALERT CARD at check-in to the Emergency Department and triage nurse.  Should you have questions after your visit or need to cancel or reschedule your appointment, please contact [facility] [phone]  and follow the prompts.  Office hours are 8:00 a.m. to 4:30 p.m. Monday - Friday. Please note that voicemails left after 4:00 p.m. may not be returned until the following business day.  We are closed weekends and major holidays. You have access to  a nurse at all times for urgent questions. Please call the main number to the clinic [phone] and follow the prompts.  For any non-urgent questions, you may also contact your provider using MyChart. We now offer e-Visits for anyone 28 and older to request care online for non-urgent symptoms. For details visit [email or web address].   Also download the MyChart app! Go to the app store, search "MyChart", open the app, select [facility], and log in with your MyChart username and password.  Due to Covid, a mask is required upon entering the hospital/clinic. If you do not have a mask, one will be given to you upon arrival.  For doctor visits, patients may have 1 support person aged 42 or older with them. For treatment visits, patients cannot have anyone with them due to current Covid guidelines and our immunocompromised population.

## 2021-12-02 NOTE — Progress Notes (Signed)
Patient tolerated Retacrit injection with no complaints voiced.  Hemoglobin is 8.7 today.  Site clean and dry with no bruising or swelling noted.  No complaints of pain.  Discharged with vital signs stable and no signs or symptoms of distress noted.

## 2021-12-03 LAB — PTH, INTACT AND CALCIUM
Calcium, Total (PTH): 8.2 mg/dL — ABNORMAL LOW (ref 8.6–10.2)
PTH: 72 pg/mL — ABNORMAL HIGH (ref 15–65)

## 2021-12-05 DIAGNOSIS — M6281 Muscle weakness (generalized): Secondary | ICD-10-CM | POA: Diagnosis not present

## 2021-12-05 DIAGNOSIS — R197 Diarrhea, unspecified: Secondary | ICD-10-CM | POA: Diagnosis not present

## 2021-12-05 DIAGNOSIS — R2681 Unsteadiness on feet: Secondary | ICD-10-CM | POA: Diagnosis not present

## 2021-12-05 DIAGNOSIS — I4891 Unspecified atrial fibrillation: Secondary | ICD-10-CM | POA: Diagnosis not present

## 2021-12-05 DIAGNOSIS — I480 Paroxysmal atrial fibrillation: Secondary | ICD-10-CM | POA: Diagnosis not present

## 2021-12-05 DIAGNOSIS — R1084 Generalized abdominal pain: Secondary | ICD-10-CM | POA: Diagnosis not present

## 2021-12-05 DIAGNOSIS — R293 Abnormal posture: Secondary | ICD-10-CM | POA: Diagnosis not present

## 2021-12-05 DIAGNOSIS — R109 Unspecified abdominal pain: Secondary | ICD-10-CM | POA: Diagnosis not present

## 2021-12-07 DIAGNOSIS — L89513 Pressure ulcer of right ankle, stage 3: Secondary | ICD-10-CM | POA: Diagnosis not present

## 2021-12-07 DIAGNOSIS — M6281 Muscle weakness (generalized): Secondary | ICD-10-CM | POA: Diagnosis not present

## 2021-12-07 DIAGNOSIS — I4891 Unspecified atrial fibrillation: Secondary | ICD-10-CM | POA: Diagnosis not present

## 2021-12-07 DIAGNOSIS — R293 Abnormal posture: Secondary | ICD-10-CM | POA: Diagnosis not present

## 2021-12-07 DIAGNOSIS — L89520 Pressure ulcer of left ankle, unstageable: Secondary | ICD-10-CM | POA: Diagnosis not present

## 2021-12-07 DIAGNOSIS — E11622 Type 2 diabetes mellitus with other skin ulcer: Secondary | ICD-10-CM | POA: Diagnosis not present

## 2021-12-07 DIAGNOSIS — R2681 Unsteadiness on feet: Secondary | ICD-10-CM | POA: Diagnosis not present

## 2021-12-07 DIAGNOSIS — I480 Paroxysmal atrial fibrillation: Secondary | ICD-10-CM | POA: Diagnosis not present

## 2021-12-07 DIAGNOSIS — E669 Obesity, unspecified: Secondary | ICD-10-CM | POA: Diagnosis not present

## 2021-12-07 LAB — HUMAN PARVOVIRUS DNA DETECTION BY PCR: Parvovirus B19, PCR: NEGATIVE

## 2021-12-08 DIAGNOSIS — E8809 Other disorders of plasma-protein metabolism, not elsewhere classified: Secondary | ICD-10-CM | POA: Diagnosis not present

## 2021-12-08 DIAGNOSIS — E46 Unspecified protein-calorie malnutrition: Secondary | ICD-10-CM | POA: Diagnosis not present

## 2021-12-09 DIAGNOSIS — M6281 Muscle weakness (generalized): Secondary | ICD-10-CM | POA: Diagnosis not present

## 2021-12-09 DIAGNOSIS — R293 Abnormal posture: Secondary | ICD-10-CM | POA: Diagnosis not present

## 2021-12-09 DIAGNOSIS — R109 Unspecified abdominal pain: Secondary | ICD-10-CM | POA: Diagnosis not present

## 2021-12-09 DIAGNOSIS — I480 Paroxysmal atrial fibrillation: Secondary | ICD-10-CM | POA: Diagnosis not present

## 2021-12-09 DIAGNOSIS — R2681 Unsteadiness on feet: Secondary | ICD-10-CM | POA: Diagnosis not present

## 2021-12-09 DIAGNOSIS — I4891 Unspecified atrial fibrillation: Secondary | ICD-10-CM | POA: Diagnosis not present

## 2021-12-09 DIAGNOSIS — R197 Diarrhea, unspecified: Secondary | ICD-10-CM | POA: Diagnosis not present

## 2021-12-10 DIAGNOSIS — E8809 Other disorders of plasma-protein metabolism, not elsewhere classified: Secondary | ICD-10-CM | POA: Diagnosis not present

## 2021-12-10 DIAGNOSIS — E46 Unspecified protein-calorie malnutrition: Secondary | ICD-10-CM | POA: Diagnosis not present

## 2021-12-13 DIAGNOSIS — E8809 Other disorders of plasma-protein metabolism, not elsewhere classified: Secondary | ICD-10-CM | POA: Diagnosis not present

## 2021-12-13 DIAGNOSIS — E46 Unspecified protein-calorie malnutrition: Secondary | ICD-10-CM | POA: Diagnosis not present

## 2021-12-14 ENCOUNTER — Ambulatory Visit (INDEPENDENT_AMBULATORY_CARE_PROVIDER_SITE_OTHER): Payer: Medicare Other | Admitting: Orthopedic Surgery

## 2021-12-14 ENCOUNTER — Other Ambulatory Visit: Payer: Self-pay | Admitting: Orthopedic Surgery

## 2021-12-14 ENCOUNTER — Ambulatory Visit (INDEPENDENT_AMBULATORY_CARE_PROVIDER_SITE_OTHER): Payer: Medicare Other

## 2021-12-14 VITALS — BP 146/103 | HR 124

## 2021-12-14 DIAGNOSIS — M25562 Pain in left knee: Secondary | ICD-10-CM

## 2021-12-14 DIAGNOSIS — E669 Obesity, unspecified: Secondary | ICD-10-CM | POA: Diagnosis not present

## 2021-12-14 DIAGNOSIS — T8484XA Pain due to internal orthopedic prosthetic devices, implants and grafts, initial encounter: Secondary | ICD-10-CM

## 2021-12-14 DIAGNOSIS — Z96652 Presence of left artificial knee joint: Secondary | ICD-10-CM

## 2021-12-14 DIAGNOSIS — L89513 Pressure ulcer of right ankle, stage 3: Secondary | ICD-10-CM | POA: Diagnosis not present

## 2021-12-14 DIAGNOSIS — E11622 Type 2 diabetes mellitus with other skin ulcer: Secondary | ICD-10-CM | POA: Diagnosis not present

## 2021-12-14 DIAGNOSIS — L89520 Pressure ulcer of left ankle, unstageable: Secondary | ICD-10-CM | POA: Diagnosis not present

## 2021-12-15 ENCOUNTER — Encounter: Payer: Self-pay | Admitting: Orthopedic Surgery

## 2021-12-15 DIAGNOSIS — E46 Unspecified protein-calorie malnutrition: Secondary | ICD-10-CM | POA: Diagnosis not present

## 2021-12-15 DIAGNOSIS — E8809 Other disorders of plasma-protein metabolism, not elsewhere classified: Secondary | ICD-10-CM | POA: Diagnosis not present

## 2021-12-16 ENCOUNTER — Other Ambulatory Visit: Payer: Self-pay | Admitting: *Deleted

## 2021-12-16 ENCOUNTER — Other Ambulatory Visit: Payer: Self-pay

## 2021-12-16 ENCOUNTER — Emergency Department (HOSPITAL_COMMUNITY): Payer: Medicare Other

## 2021-12-16 ENCOUNTER — Encounter (HOSPITAL_COMMUNITY): Payer: Self-pay

## 2021-12-16 ENCOUNTER — Emergency Department (HOSPITAL_COMMUNITY)
Admission: EM | Admit: 2021-12-16 | Discharge: 2021-12-17 | Disposition: A | Discharge status: Skilled Nursing Facility | Payer: Medicare Other | Attending: Emergency Medicine | Admitting: Emergency Medicine

## 2021-12-16 ENCOUNTER — Inpatient Hospital Stay (HOSPITAL_COMMUNITY): Payer: Medicare Other

## 2021-12-16 DIAGNOSIS — M79606 Pain in leg, unspecified: Secondary | ICD-10-CM

## 2021-12-16 DIAGNOSIS — I4891 Unspecified atrial fibrillation: Secondary | ICD-10-CM | POA: Insufficient documentation

## 2021-12-16 DIAGNOSIS — L03115 Cellulitis of right lower limb: Secondary | ICD-10-CM | POA: Diagnosis not present

## 2021-12-16 DIAGNOSIS — L03116 Cellulitis of left lower limb: Secondary | ICD-10-CM | POA: Insufficient documentation

## 2021-12-16 DIAGNOSIS — N186 End stage renal disease: Secondary | ICD-10-CM | POA: Insufficient documentation

## 2021-12-16 DIAGNOSIS — Z992 Dependence on renal dialysis: Secondary | ICD-10-CM | POA: Insufficient documentation

## 2021-12-16 DIAGNOSIS — R000 Tachycardia, unspecified: Secondary | ICD-10-CM | POA: Diagnosis not present

## 2021-12-16 DIAGNOSIS — I5030 Unspecified diastolic (congestive) heart failure: Secondary | ICD-10-CM | POA: Diagnosis not present

## 2021-12-16 DIAGNOSIS — E119 Type 2 diabetes mellitus without complications: Secondary | ICD-10-CM | POA: Insufficient documentation

## 2021-12-16 DIAGNOSIS — Z7901 Long term (current) use of anticoagulants: Secondary | ICD-10-CM | POA: Insufficient documentation

## 2021-12-16 DIAGNOSIS — L03119 Cellulitis of unspecified part of limb: Secondary | ICD-10-CM

## 2021-12-16 DIAGNOSIS — Z794 Long term (current) use of insulin: Secondary | ICD-10-CM | POA: Insufficient documentation

## 2021-12-16 DIAGNOSIS — D631 Anemia in chronic kidney disease: Secondary | ICD-10-CM

## 2021-12-16 DIAGNOSIS — D508 Other iron deficiency anemias: Secondary | ICD-10-CM

## 2021-12-16 LAB — CBC
HCT: 30 % — ABNORMAL LOW (ref 39.0–52.0)
HCT: 29.3 % — ABNORMAL LOW (ref 39.0–52.0)
Hemoglobin: 9.4 g/dL — ABNORMAL LOW (ref 13.0–17.0)
Hemoglobin: 9.1 g/dL — ABNORMAL LOW (ref 13.0–17.0)
MCH: 26.6 pg (ref 26.0–34.0)
MCH: 26.5 pg (ref 26.0–34.0)
MCHC: 31.3 g/dL (ref 30.0–36.0)
MCHC: 31.1 g/dL (ref 30.0–36.0)
MCV: 85 fL (ref 80.0–100.0)
MCV: 85.4 fL (ref 80.0–100.0)
Platelets: 305 10*3/uL (ref 150–400)
Platelets: 312 10*3/uL (ref 150–400)
RBC: 3.53 MIL/uL — ABNORMAL LOW (ref 4.22–5.81)
RBC: 3.43 MIL/uL — ABNORMAL LOW (ref 4.22–5.81)
RDW: 20.8 % — ABNORMAL HIGH (ref 11.5–15.5)
RDW: 20.6 % — ABNORMAL HIGH (ref 11.5–15.5)
WBC: 15.2 10*3/uL — ABNORMAL HIGH (ref 4.0–10.5)
WBC: 15.3 10*3/uL — ABNORMAL HIGH (ref 4.0–10.5)
nRBC: 0 % (ref 0.0–0.2)
nRBC: 0 % (ref 0.0–0.2)

## 2021-12-16 LAB — DIFFERENTIAL
Abs Immature Granulocytes: 0.09 10*3/uL — ABNORMAL HIGH (ref 0.00–0.07)
Basophils Absolute: 0.1 10*3/uL (ref 0.0–0.1)
Basophils Relative: 0 %
Eosinophils Absolute: 0 10*3/uL (ref 0.0–0.5)
Eosinophils Relative: 0 %
Immature Granulocytes: 1 %
Lymphocytes Relative: 4 %
Lymphs Abs: 0.6 10*3/uL — ABNORMAL LOW (ref 0.7–4.0)
Monocytes Absolute: 0.9 10*3/uL (ref 0.1–1.0)
Monocytes Relative: 6 %
Neutro Abs: 14.9 10*3/uL — ABNORMAL HIGH (ref 1.7–7.7)
Neutrophils Relative %: 89 %

## 2021-12-16 LAB — BASIC METABOLIC PANEL
Anion gap: 8 (ref 5–15)
BUN: 42 mg/dL — ABNORMAL HIGH (ref 8–23)
CO2: 26 mmol/L (ref 22–32)
Calcium: 8.2 mg/dL — ABNORMAL LOW (ref 8.9–10.3)
Chloride: 100 mmol/L (ref 98–111)
Creatinine, Ser: 4.01 mg/dL — ABNORMAL HIGH (ref 0.61–1.24)
GFR, Estimated: 15 mL/min — ABNORMAL LOW (ref >60)
Glucose, Bld: 168 mg/dL — ABNORMAL HIGH (ref 70–99)
Potassium: 4.6 mmol/L (ref 3.5–5.1)
Sodium: 134 mmol/L — ABNORMAL LOW (ref 135–145)

## 2021-12-16 LAB — LACTIC ACID, PLASMA
Lactic Acid, Venous: 1.7 mmol/L (ref 0.5–1.9)
Lactic Acid, Venous: 1.5 mmol/L (ref 0.5–1.9)

## 2021-12-16 LAB — URINALYSIS, ROUTINE W REFLEX MICROSCOPIC
Bilirubin Urine: NEGATIVE
Glucose, UA: NEGATIVE mg/dL
Ketones, ur: NEGATIVE mg/dL
Nitrite: NEGATIVE
Protein, ur: 100 mg/dL — AB
Specific Gravity, Urine: 1.01 (ref 1.005–1.030)
WBC, UA: >50 WBC/hpf — ABNORMAL HIGH (ref 0–5)
pH: 5 (ref 5.0–8.0)

## 2021-12-16 LAB — TROPONIN I (HIGH SENSITIVITY)
Troponin I (High Sensitivity): 18 ng/L — ABNORMAL HIGH (ref <18)
Troponin I (High Sensitivity): 19 ng/L — ABNORMAL HIGH (ref <18)

## 2021-12-16 LAB — SAMPLE TO BLOOD BANK

## 2021-12-16 MED ORDER — PIPERACILLIN-TAZOBACTAM IN DEX 2-0.25 GM/50ML IV SOLN
2.2500 g | Freq: Three times a day (TID) | INTRAVENOUS | Status: DC
  Filled 2021-12-16 (×2): qty 50

## 2021-12-16 MED ORDER — VANCOMYCIN HCL 10 G IV SOLR
2500.0000 mg | Freq: Once | INTRAVENOUS | Status: DC
  Filled 2021-12-16: qty 25

## 2021-12-16 MED ORDER — CEPHALEXIN 500 MG PO CAPS: 500.0000 mg | ORAL_CAPSULE | Freq: Four times a day (QID) | ORAL | 0 refills | Status: DC

## 2021-12-16 MED ORDER — PIPERACILLIN-TAZOBACTAM 3.375 G IVPB 30 MIN
3.3750 g | Freq: Once | INTRAVENOUS | Status: AC
  Administered 2021-12-16: 3.375 g via INTRAVENOUS
  Filled 2021-12-16: qty 50

## 2021-12-16 MED ORDER — VANCOMYCIN HCL IN DEXTROSE 1-5 GM/200ML-% IV SOLN
1000.0000 mg | INTRAVENOUS | Status: DC
  Filled 2021-12-16: qty 200

## 2021-12-16 MED ORDER — METOPROLOL TARTRATE 50 MG PO TABS
50.0000 mg | ORAL_TABLET | Freq: Once | ORAL | Status: AC
  Administered 2021-12-16: 50 mg via ORAL
  Filled 2021-12-16: qty 1

## 2021-12-16 NOTE — ED Notes (Signed)
[facility] called to get an update on pt.

## 2021-12-17 DIAGNOSIS — E8809 Other disorders of plasma-protein metabolism, not elsewhere classified: Secondary | ICD-10-CM | POA: Diagnosis not present

## 2021-12-17 DIAGNOSIS — E46 Unspecified protein-calorie malnutrition: Secondary | ICD-10-CM | POA: Diagnosis not present

## 2021-12-19 ENCOUNTER — Ambulatory Visit (HOSPITAL_COMMUNITY): Payer: Medicare Other

## 2021-12-19 LAB — URINE CULTURE: Culture: >=100000 — AB

## 2021-12-20 ENCOUNTER — Telehealth: Payer: Self-pay | Admitting: Emergency Medicine

## 2021-12-20 DIAGNOSIS — E8809 Other disorders of plasma-protein metabolism, not elsewhere classified: Secondary | ICD-10-CM | POA: Diagnosis not present

## 2021-12-20 DIAGNOSIS — E46 Unspecified protein-calorie malnutrition: Secondary | ICD-10-CM | POA: Diagnosis not present

## 2021-12-20 DIAGNOSIS — N2581 Secondary hyperparathyroidism of renal origin: Secondary | ICD-10-CM | POA: Diagnosis not present

## 2021-12-20 DIAGNOSIS — Z992 Dependence on renal dialysis: Secondary | ICD-10-CM | POA: Diagnosis not present

## 2021-12-20 DIAGNOSIS — N186 End stage renal disease: Secondary | ICD-10-CM | POA: Diagnosis not present

## 2021-12-21 ENCOUNTER — Other Ambulatory Visit (HOSPITAL_COMMUNITY): Payer: Medicare Other

## 2021-12-21 DIAGNOSIS — E11622 Type 2 diabetes mellitus with other skin ulcer: Secondary | ICD-10-CM | POA: Diagnosis not present

## 2021-12-21 DIAGNOSIS — L89513 Pressure ulcer of right ankle, stage 3: Secondary | ICD-10-CM | POA: Diagnosis not present

## 2021-12-21 DIAGNOSIS — E1051 Type 1 diabetes mellitus with diabetic peripheral angiopathy without gangrene: Secondary | ICD-10-CM | POA: Diagnosis not present

## 2021-12-21 DIAGNOSIS — L89520 Pressure ulcer of left ankle, unstageable: Secondary | ICD-10-CM | POA: Diagnosis not present

## 2021-12-21 DIAGNOSIS — E669 Obesity, unspecified: Secondary | ICD-10-CM | POA: Diagnosis not present

## 2021-12-21 DIAGNOSIS — L84 Corns and callosities: Secondary | ICD-10-CM | POA: Diagnosis not present

## 2021-12-21 LAB — CULTURE, BLOOD (ROUTINE X 2)
Culture: NO GROWTH
Culture: NO GROWTH
Special Requests: ADEQUATE
Special Requests: ADEQUATE

## 2021-12-22 DIAGNOSIS — E8809 Other disorders of plasma-protein metabolism, not elsewhere classified: Secondary | ICD-10-CM | POA: Diagnosis not present

## 2021-12-22 DIAGNOSIS — E46 Unspecified protein-calorie malnutrition: Secondary | ICD-10-CM | POA: Diagnosis not present

## 2021-12-23 DIAGNOSIS — N186 End stage renal disease: Secondary | ICD-10-CM | POA: Diagnosis not present

## 2021-12-23 DIAGNOSIS — Z992 Dependence on renal dialysis: Secondary | ICD-10-CM | POA: Diagnosis not present

## 2021-12-24 DIAGNOSIS — E8809 Other disorders of plasma-protein metabolism, not elsewhere classified: Secondary | ICD-10-CM | POA: Diagnosis not present

## 2021-12-24 DIAGNOSIS — E46 Unspecified protein-calorie malnutrition: Secondary | ICD-10-CM | POA: Diagnosis not present

## 2021-12-26 DIAGNOSIS — E46 Unspecified protein-calorie malnutrition: Secondary | ICD-10-CM | POA: Diagnosis not present

## 2021-12-26 DIAGNOSIS — E8809 Other disorders of plasma-protein metabolism, not elsewhere classified: Secondary | ICD-10-CM | POA: Diagnosis not present

## 2021-12-28 ENCOUNTER — Ambulatory Visit: Payer: Medicare Other | Admitting: Vascular Surgery

## 2021-12-28 DIAGNOSIS — E46 Unspecified protein-calorie malnutrition: Secondary | ICD-10-CM | POA: Diagnosis not present

## 2021-12-28 DIAGNOSIS — E8809 Other disorders of plasma-protein metabolism, not elsewhere classified: Secondary | ICD-10-CM | POA: Diagnosis not present

## 2021-12-30 ENCOUNTER — Inpatient Hospital Stay (HOSPITAL_COMMUNITY): Payer: Medicare Other

## 2021-12-30 ENCOUNTER — Inpatient Hospital Stay (HOSPITAL_COMMUNITY): Payer: Medicare Other | Attending: Physician Assistant

## 2021-12-30 ENCOUNTER — Encounter (HOSPITAL_COMMUNITY): Payer: Self-pay

## 2021-12-30 VITALS — BP 134/63 | HR 85 | Temp 96.2°F | Resp 18

## 2021-12-30 DIAGNOSIS — N186 End stage renal disease: Secondary | ICD-10-CM | POA: Diagnosis present

## 2021-12-30 DIAGNOSIS — D631 Anemia in chronic kidney disease: Secondary | ICD-10-CM | POA: Insufficient documentation

## 2021-12-30 DIAGNOSIS — D472 Monoclonal gammopathy: Secondary | ICD-10-CM | POA: Diagnosis present

## 2021-12-30 DIAGNOSIS — E11622 Type 2 diabetes mellitus with other skin ulcer: Secondary | ICD-10-CM | POA: Diagnosis not present

## 2021-12-30 DIAGNOSIS — L89520 Pressure ulcer of left ankle, unstageable: Secondary | ICD-10-CM | POA: Diagnosis not present

## 2021-12-30 DIAGNOSIS — E669 Obesity, unspecified: Secondary | ICD-10-CM | POA: Diagnosis not present

## 2021-12-30 DIAGNOSIS — L89513 Pressure ulcer of right ankle, stage 3: Secondary | ICD-10-CM | POA: Diagnosis not present

## 2021-12-30 DIAGNOSIS — D508 Other iron deficiency anemias: Secondary | ICD-10-CM

## 2021-12-30 LAB — CBC
HCT: 28.6 % — ABNORMAL LOW (ref 39.0–52.0)
Hemoglobin: 9 g/dL — ABNORMAL LOW (ref 13.0–17.0)
MCH: 26.5 pg (ref 26.0–34.0)
MCHC: 31.5 g/dL (ref 30.0–36.0)
MCV: 84.4 fL (ref 80.0–100.0)
Platelets: 274 10*3/uL (ref 150–400)
RBC: 3.39 MIL/uL — ABNORMAL LOW (ref 4.22–5.81)
RDW: 19.4 % — ABNORMAL HIGH (ref 11.5–15.5)
WBC: 9.1 10*3/uL (ref 4.0–10.5)
nRBC: 0 % (ref 0.0–0.2)

## 2021-12-30 LAB — SAMPLE TO BLOOD BANK

## 2021-12-30 MED ORDER — EPOETIN ALFA-EPBX 40000 UNIT/ML IJ SOLN
40000.0000 [IU] | Freq: Once | INTRAMUSCULAR | Status: AC
  Administered 2021-12-30: 40000 [IU] via SUBCUTANEOUS
  Filled 2021-12-30: qty 1

## 2021-12-30 NOTE — Patient Instructions (Signed)
[name]  Discharge Instructions: Thank you for choosing [facility] to provide your oncology and hematology care.  If you have a lab appointment with the [facility], please come in thru the Main Entrance and check in at the main information desk.  Wear comfortable clothing and clothing appropriate for easy access to any Portacath or PICC line.   We strive to give you quality time with your provider. You may need to reschedule your appointment if you arrive late (15 or more minutes).  Arriving late affects you and other patients whose appointments are after yours.  Also, if you miss three or more appointments without notifying the office, you may be dismissed from the clinic at the provider's discretion.      For prescription refill requests, have your pharmacy contact our office and allow 72 hours for refills to be completed.    Today you received the following chemotherapy and/or immunotherapy agents Retacrit, return as scheduled.   To help prevent nausea and vomiting after your treatment, we encourage you to take your nausea medication as directed.  BELOW ARE SYMPTOMS THAT SHOULD BE REPORTED IMMEDIATELY: *FEVER GREATER THAN 100.4 F (38 C) OR HIGHER *CHILLS OR SWEATING *NAUSEA AND VOMITING THAT IS NOT CONTROLLED WITH YOUR NAUSEA MEDICATION *UNUSUAL SHORTNESS OF BREATH *UNUSUAL BRUISING OR BLEEDING *URINARY PROBLEMS (pain or burning when urinating, or frequent urination) *BOWEL PROBLEMS (unusual diarrhea, constipation, pain near the anus) TENDERNESS IN MOUTH AND THROAT WITH OR WITHOUT PRESENCE OF ULCERS (sore throat, sores in mouth, or a toothache) UNUSUAL RASH, SWELLING OR PAIN  UNUSUAL VAGINAL DISCHARGE OR ITCHING   Items with * indicate a potential emergency and should be followed up as soon as possible or go to the Emergency Department if any problems should occur.  Please show the CHEMOTHERAPY ALERT CARD or IMMUNOTHERAPY ALERT CARD at check-in to  the Emergency Department and triage nurse.  Should you have questions after your visit or need to cancel or reschedule your appointment, please contact [facility] [phone]  and follow the prompts.  Office hours are 8:00 a.m. to 4:30 p.m. Monday - Friday. Please note that voicemails left after 4:00 p.m. may not be returned until the following business day.  We are closed weekends and major holidays. You have access to a nurse at all times for urgent questions. Please call the main number to the clinic [phone] and follow the prompts.  For any non-urgent questions, you may also contact your provider using MyChart. We now offer e-Visits for anyone 12 and older to request care online for non-urgent symptoms. For details visit [email or web address].   Also download the MyChart app! Go to the app store, search "MyChart", open the app, select [facility], and log in with your MyChart username and password.  Masks are optional in the cancer centers. If you would like for your care team to wear a mask while they are taking care of you, please let them know. For doctor visits, patients may have with them one support person who is at least 68 years old. At this time, visitors are not allowed in the infusion area.

## 2021-12-30 NOTE — Progress Notes (Signed)
Patient presents today for Retacrit injection. Hemoglobin reviewed prior to administration. VSS tolerated without incident or complaint. See MAR for details. Patient stable during and after injection. Patient discharged in satisfactory condition with no s/s of distress noted.  

## 2022-01-06 ENCOUNTER — Other Ambulatory Visit (HOSPITAL_COMMUNITY): Payer: Medicare Other

## 2022-01-10 ENCOUNTER — Inpatient Hospital Stay (HOSPITAL_COMMUNITY)
Admission: EM | Admit: 2022-01-10 | Discharge: 2022-01-19 | DRG: 871 | Disposition: A | Discharge status: Hospice | Payer: Medicare Other | Attending: Internal Medicine | Admitting: Internal Medicine

## 2022-01-10 ENCOUNTER — Emergency Department (HOSPITAL_COMMUNITY): Payer: Medicare Other

## 2022-01-10 ENCOUNTER — Other Ambulatory Visit: Payer: Self-pay

## 2022-01-10 DIAGNOSIS — Z79899 Other long term (current) drug therapy: Secondary | ICD-10-CM

## 2022-01-10 DIAGNOSIS — Z66 Do not resuscitate: Secondary | ICD-10-CM | POA: Diagnosis not present

## 2022-01-10 DIAGNOSIS — Z96653 Presence of artificial knee joint, bilateral: Secondary | ICD-10-CM | POA: Diagnosis present

## 2022-01-10 DIAGNOSIS — Z833 Family history of diabetes mellitus: Secondary | ICD-10-CM

## 2022-01-10 DIAGNOSIS — R6521 Severe sepsis with septic shock: Secondary | ICD-10-CM | POA: Diagnosis present

## 2022-01-10 DIAGNOSIS — E1121 Type 2 diabetes mellitus with diabetic nephropathy: Secondary | ICD-10-CM | POA: Diagnosis not present

## 2022-01-10 DIAGNOSIS — N186 End stage renal disease: Secondary | ICD-10-CM | POA: Diagnosis present

## 2022-01-10 DIAGNOSIS — E871 Hypo-osmolality and hyponatremia: Secondary | ICD-10-CM

## 2022-01-10 DIAGNOSIS — Z20822 Contact with and (suspected) exposure to covid-19: Secondary | ICD-10-CM | POA: Diagnosis present

## 2022-01-10 DIAGNOSIS — E669 Obesity, unspecified: Secondary | ICD-10-CM | POA: Diagnosis present

## 2022-01-10 DIAGNOSIS — G9340 Encephalopathy, unspecified: Principal | ICD-10-CM

## 2022-01-10 DIAGNOSIS — E1110 Type 2 diabetes mellitus with ketoacidosis without coma: Secondary | ICD-10-CM | POA: Diagnosis not present

## 2022-01-10 DIAGNOSIS — E1122 Type 2 diabetes mellitus with diabetic chronic kidney disease: Secondary | ICD-10-CM | POA: Diagnosis present

## 2022-01-10 DIAGNOSIS — M109 Gout, unspecified: Secondary | ICD-10-CM | POA: Diagnosis present

## 2022-01-10 DIAGNOSIS — N2581 Secondary hyperparathyroidism of renal origin: Secondary | ICD-10-CM | POA: Diagnosis present

## 2022-01-10 DIAGNOSIS — Z8249 Family history of ischemic heart disease and other diseases of the circulatory system: Secondary | ICD-10-CM

## 2022-01-10 DIAGNOSIS — G9341 Metabolic encephalopathy: Secondary | ICD-10-CM | POA: Diagnosis present

## 2022-01-10 DIAGNOSIS — M86672 Other chronic osteomyelitis, left ankle and foot: Secondary | ICD-10-CM | POA: Diagnosis present

## 2022-01-10 DIAGNOSIS — M19071 Primary osteoarthritis, right ankle and foot: Secondary | ICD-10-CM | POA: Diagnosis present

## 2022-01-10 DIAGNOSIS — R7881 Bacteremia: Secondary | ICD-10-CM | POA: Diagnosis not present

## 2022-01-10 DIAGNOSIS — I959 Hypotension, unspecified: Secondary | ICD-10-CM

## 2022-01-10 DIAGNOSIS — S91301A Unspecified open wound, right foot, initial encounter: Secondary | ICD-10-CM | POA: Diagnosis not present

## 2022-01-10 DIAGNOSIS — R6520 Severe sepsis without septic shock: Secondary | ICD-10-CM

## 2022-01-10 DIAGNOSIS — Z7401 Bed confinement status: Secondary | ICD-10-CM

## 2022-01-10 DIAGNOSIS — Z87891 Personal history of nicotine dependence: Secondary | ICD-10-CM

## 2022-01-10 DIAGNOSIS — A401 Sepsis due to streptococcus, group B: Principal | ICD-10-CM | POA: Diagnosis present

## 2022-01-10 DIAGNOSIS — L03115 Cellulitis of right lower limb: Secondary | ICD-10-CM | POA: Diagnosis present

## 2022-01-10 DIAGNOSIS — M19072 Primary osteoarthritis, left ankle and foot: Secondary | ICD-10-CM | POA: Diagnosis present

## 2022-01-10 DIAGNOSIS — I482 Chronic atrial fibrillation, unspecified: Secondary | ICD-10-CM | POA: Diagnosis not present

## 2022-01-10 DIAGNOSIS — E119 Type 2 diabetes mellitus without complications: Secondary | ICD-10-CM

## 2022-01-10 DIAGNOSIS — Z515 Encounter for palliative care: Secondary | ICD-10-CM | POA: Diagnosis not present

## 2022-01-10 DIAGNOSIS — N390 Urinary tract infection, site not specified: Secondary | ICD-10-CM | POA: Diagnosis present

## 2022-01-10 DIAGNOSIS — E875 Hyperkalemia: Secondary | ICD-10-CM | POA: Diagnosis not present

## 2022-01-10 DIAGNOSIS — I480 Paroxysmal atrial fibrillation: Secondary | ICD-10-CM | POA: Diagnosis not present

## 2022-01-10 DIAGNOSIS — Z993 Dependence on wheelchair: Secondary | ICD-10-CM

## 2022-01-10 DIAGNOSIS — Z794 Long term (current) use of insulin: Secondary | ICD-10-CM | POA: Diagnosis not present

## 2022-01-10 DIAGNOSIS — B955 Unspecified streptococcus as the cause of diseases classified elsewhere: Secondary | ICD-10-CM

## 2022-01-10 DIAGNOSIS — I5032 Chronic diastolic (congestive) heart failure: Secondary | ICD-10-CM | POA: Diagnosis present

## 2022-01-10 DIAGNOSIS — J9601 Acute respiratory failure with hypoxia: Secondary | ICD-10-CM | POA: Diagnosis present

## 2022-01-10 DIAGNOSIS — I501 Left ventricular failure: Secondary | ICD-10-CM | POA: Diagnosis not present

## 2022-01-10 DIAGNOSIS — D631 Anemia in chronic kidney disease: Secondary | ICD-10-CM | POA: Diagnosis present

## 2022-01-10 DIAGNOSIS — E782 Mixed hyperlipidemia: Secondary | ICD-10-CM

## 2022-01-10 DIAGNOSIS — I953 Hypotension of hemodialysis: Secondary | ICD-10-CM | POA: Diagnosis not present

## 2022-01-10 DIAGNOSIS — Z9109 Other allergy status, other than to drugs and biological substances: Secondary | ICD-10-CM

## 2022-01-10 DIAGNOSIS — I4819 Other persistent atrial fibrillation: Secondary | ICD-10-CM | POA: Diagnosis present

## 2022-01-10 DIAGNOSIS — Z992 Dependence on renal dialysis: Secondary | ICD-10-CM | POA: Diagnosis not present

## 2022-01-10 DIAGNOSIS — D509 Iron deficiency anemia, unspecified: Secondary | ICD-10-CM | POA: Diagnosis not present

## 2022-01-10 DIAGNOSIS — Z96659 Presence of unspecified artificial knee joint: Secondary | ICD-10-CM

## 2022-01-10 DIAGNOSIS — M86679 Other chronic osteomyelitis, unspecified ankle and foot: Secondary | ICD-10-CM

## 2022-01-10 DIAGNOSIS — Z7189 Other specified counseling: Secondary | ICD-10-CM | POA: Diagnosis not present

## 2022-01-10 DIAGNOSIS — B9620 Unspecified Escherichia coli [E. coli] as the cause of diseases classified elsewhere: Secondary | ICD-10-CM | POA: Diagnosis present

## 2022-01-10 DIAGNOSIS — M86671 Other chronic osteomyelitis, right ankle and foot: Secondary | ICD-10-CM | POA: Diagnosis present

## 2022-01-10 DIAGNOSIS — A419 Sepsis, unspecified organism: Secondary | ICD-10-CM | POA: Diagnosis not present

## 2022-01-10 DIAGNOSIS — L03119 Cellulitis of unspecified part of limb: Secondary | ICD-10-CM | POA: Diagnosis not present

## 2022-01-10 DIAGNOSIS — E872 Acidosis, unspecified: Secondary | ICD-10-CM | POA: Diagnosis not present

## 2022-01-10 DIAGNOSIS — I132 Hypertensive heart and chronic kidney disease with heart failure and with stage 5 chronic kidney disease, or end stage renal disease: Secondary | ICD-10-CM | POA: Diagnosis present

## 2022-01-10 DIAGNOSIS — Z888 Allergy status to other drugs, medicaments and biological substances status: Secondary | ICD-10-CM

## 2022-01-10 DIAGNOSIS — I440 Atrioventricular block, first degree: Secondary | ICD-10-CM | POA: Diagnosis not present

## 2022-01-10 DIAGNOSIS — Z7901 Long term (current) use of anticoagulants: Secondary | ICD-10-CM

## 2022-01-10 DIAGNOSIS — Z6834 Body mass index (BMI) 34.0-34.9, adult: Secondary | ICD-10-CM

## 2022-01-10 DIAGNOSIS — R4182 Altered mental status, unspecified: Secondary | ICD-10-CM | POA: Diagnosis not present

## 2022-01-10 DIAGNOSIS — E876 Hypokalemia: Secondary | ICD-10-CM | POA: Diagnosis present

## 2022-01-10 LAB — URINALYSIS, ROUTINE W REFLEX MICROSCOPIC
Bilirubin Urine: NEGATIVE
Glucose, UA: NEGATIVE mg/dL
Ketones, ur: NEGATIVE mg/dL
Nitrite: NEGATIVE
Protein, ur: 100 mg/dL — AB
Specific Gravity, Urine: 1.01 (ref 1.005–1.030)
WBC, UA: >50 WBC/hpf — ABNORMAL HIGH (ref 0–5)
pH: 6 (ref 5.0–8.0)

## 2022-01-10 LAB — RESP PANEL BY RT-PCR (FLU A&B, COVID) ARPGX2
Influenza A by PCR: NEGATIVE
Influenza B by PCR: NEGATIVE
SARS Coronavirus 2 by RT PCR: NEGATIVE

## 2022-01-10 LAB — CBC WITH DIFFERENTIAL/PLATELET
Abs Immature Granulocytes: 0.17 10*3/uL — ABNORMAL HIGH (ref 0.00–0.07)
Basophils Absolute: 0 10*3/uL (ref 0.0–0.1)
Basophils Relative: 0 %
Eosinophils Absolute: 0 10*3/uL (ref 0.0–0.5)
Eosinophils Relative: 0 %
HCT: 29.7 % — ABNORMAL LOW (ref 39.0–52.0)
Hemoglobin: 9.6 g/dL — ABNORMAL LOW (ref 13.0–17.0)
Immature Granulocytes: 2 %
Lymphocytes Relative: 1 %
Lymphs Abs: 0.1 10*3/uL — ABNORMAL LOW (ref 0.7–4.0)
MCH: 27.2 pg (ref 26.0–34.0)
MCHC: 32.3 g/dL (ref 30.0–36.0)
MCV: 84.1 fL (ref 80.0–100.0)
Monocytes Absolute: 0.2 10*3/uL (ref 0.1–1.0)
Monocytes Relative: 2 %
Neutro Abs: 10.1 10*3/uL — ABNORMAL HIGH (ref 1.7–7.7)
Neutrophils Relative %: 95 %
Platelets: 251 10*3/uL (ref 150–400)
RBC: 3.53 MIL/uL — ABNORMAL LOW (ref 4.22–5.81)
RDW: 19.9 % — ABNORMAL HIGH (ref 11.5–15.5)
WBC: 10.6 10*3/uL — ABNORMAL HIGH (ref 4.0–10.5)
nRBC: 0 % (ref 0.0–0.2)

## 2022-01-10 LAB — COMPREHENSIVE METABOLIC PANEL
ALT: 18 U/L (ref 0–44)
AST: 21 U/L (ref 15–41)
Albumin: 2 g/dL — ABNORMAL LOW (ref 3.5–5.0)
Alkaline Phosphatase: 187 U/L — ABNORMAL HIGH (ref 38–126)
Anion gap: 11 (ref 5–15)
BUN: 55 mg/dL — ABNORMAL HIGH (ref 8–23)
CO2: 26 mmol/L (ref 22–32)
Calcium: 7.7 mg/dL — ABNORMAL LOW (ref 8.9–10.3)
Chloride: 96 mmol/L — ABNORMAL LOW (ref 98–111)
Creatinine, Ser: 4.34 mg/dL — ABNORMAL HIGH (ref 0.61–1.24)
GFR, Estimated: 14 mL/min — ABNORMAL LOW (ref >60)
Glucose, Bld: 129 mg/dL — ABNORMAL HIGH (ref 70–99)
Potassium: 4.2 mmol/L (ref 3.5–5.1)
Sodium: 133 mmol/L — ABNORMAL LOW (ref 135–145)
Total Bilirubin: 2.2 mg/dL — ABNORMAL HIGH (ref 0.3–1.2)
Total Protein: 7.4 g/dL (ref 6.5–8.1)

## 2022-01-10 LAB — SEDIMENTATION RATE: Sed Rate: 100 mm/hr — ABNORMAL HIGH (ref 0–16)

## 2022-01-10 LAB — PROTIME-INR
INR: 1.8 — ABNORMAL HIGH (ref 0.8–1.2)
Prothrombin Time: 20.9 seconds — ABNORMAL HIGH (ref 11.4–15.2)

## 2022-01-10 LAB — APTT: aPTT: 37 seconds — ABNORMAL HIGH (ref 24–36)

## 2022-01-10 LAB — C-REACTIVE PROTEIN: CRP: 24 mg/dL — ABNORMAL HIGH (ref <1.0)

## 2022-01-10 LAB — LACTIC ACID, PLASMA
Lactic Acid, Venous: 2 mmol/L (ref 0.5–1.9)
Lactic Acid, Venous: 3 mmol/L (ref 0.5–1.9)

## 2022-01-10 MED ORDER — ACETAMINOPHEN 325 MG PO TABS
650.0000 mg | ORAL_TABLET | Freq: Four times a day (QID) | ORAL | Status: DC | PRN
  Administered 2022-01-15 – 2022-01-19 (×7): 650 mg via ORAL
  Filled 2022-01-10 (×7): qty 2

## 2022-01-10 MED ORDER — METRONIDAZOLE 500 MG/100ML IV SOLN
500.0000 mg | Freq: Once | INTRAVENOUS | Status: AC
  Administered 2022-01-10: 500 mg via INTRAVENOUS
  Filled 2022-01-10: qty 100

## 2022-01-10 MED ORDER — LACTATED RINGERS IV SOLN: INTRAVENOUS | Status: AC

## 2022-01-10 MED ORDER — SODIUM CHLORIDE 0.9 % IV SOLN
250.0000 mL | INTRAVENOUS | Status: DC
Start: 2022-01-10 — End: 2022-01-12

## 2022-01-10 MED ORDER — INSULIN ASPART 100 UNIT/ML IJ SOLN
0.0000 [IU] | Freq: Three times a day (TID) | INTRAMUSCULAR | Status: DC
  Administered 2022-01-11: 2 [IU] via SUBCUTANEOUS
  Administered 2022-01-11 – 2022-01-16 (×8): 1 [IU] via SUBCUTANEOUS
  Administered 2022-01-17: 2 [IU] via SUBCUTANEOUS
  Filled 2022-01-10: qty 1

## 2022-01-10 MED ORDER — SENNA 8.6 MG PO TABS
2.0000 | ORAL_TABLET | Freq: Two times a day (BID) | ORAL | Status: DC
  Administered 2022-01-10 – 2022-01-18 (×16): 17.2 mg via ORAL
  Filled 2022-01-10 (×16): qty 2

## 2022-01-10 MED ORDER — VASOPRESSIN 20 UNITS/100 ML INFUSION FOR SHOCK
0.0000 [IU]/min | INTRAVENOUS | Status: DC
  Administered 2022-01-11 (×2): 0.03 [IU]/min via INTRAVENOUS
  Filled 2022-01-10 (×3): qty 100

## 2022-01-10 MED ORDER — ONDANSETRON HCL 4 MG PO TABS: 4.0000 mg | ORAL_TABLET | Freq: Four times a day (QID) | ORAL | Status: DC | PRN

## 2022-01-10 MED ORDER — VANCOMYCIN HCL 2000 MG/400ML IV SOLN
2000.0000 mg | Freq: Once | INTRAVENOUS | Status: AC
  Administered 2022-01-10: 2000 mg via INTRAVENOUS
  Filled 2022-01-10: qty 400

## 2022-01-10 MED ORDER — NOREPINEPHRINE 4 MG/250ML-% IV SOLN
2.0000 ug/min | INTRAVENOUS | Status: DC
  Administered 2022-01-10: 4 ug/min via INTRAVENOUS
  Filled 2022-01-10: qty 250

## 2022-01-10 MED ORDER — SODIUM CHLORIDE 0.9 % IV SOLN
2.0000 g | Freq: Once | INTRAVENOUS | Status: AC
  Administered 2022-01-10: 2 g via INTRAVENOUS
  Filled 2022-01-10: qty 12.5

## 2022-01-10 MED ORDER — ONDANSETRON HCL 4 MG/2ML IJ SOLN
4.0000 mg | Freq: Four times a day (QID) | INTRAMUSCULAR | Status: DC | PRN
  Administered 2022-01-12 – 2022-01-14 (×2): 4 mg via INTRAVENOUS
  Filled 2022-01-10 (×2): qty 2

## 2022-01-10 MED ORDER — VANCOMYCIN HCL IN DEXTROSE 1-5 GM/200ML-% IV SOLN: 1000.0000 mg | Freq: Once | INTRAVENOUS | Status: DC

## 2022-01-10 MED ORDER — OMEGA-3-ACID ETHYL ESTERS 1 G PO CAPS
1.0000 g | ORAL_CAPSULE | Freq: Every day | ORAL | Status: DC
  Administered 2022-01-11 – 2022-01-18 (×8): 1 g via ORAL
  Filled 2022-01-10 (×8): qty 1

## 2022-01-10 MED ORDER — MIDODRINE HCL 5 MG PO TABS: 10.0000 mg | ORAL_TABLET | ORAL | Status: DC

## 2022-01-10 MED ORDER — SODIUM CHLORIDE 0.9 % IV SOLN
1.0000 g | INTRAVENOUS | Status: DC
  Administered 2022-01-11: 1 g via INTRAVENOUS
  Filled 2022-01-10 (×2): qty 10

## 2022-01-10 MED ORDER — ACETAMINOPHEN 325 MG PO TABS
650.0000 mg | ORAL_TABLET | Freq: Once | ORAL | Status: AC
  Administered 2022-01-10: 650 mg via ORAL
  Filled 2022-01-10: qty 2

## 2022-01-10 MED ORDER — SEVELAMER CARBONATE 800 MG PO TABS
800.0000 mg | ORAL_TABLET | Freq: Two times a day (BID) | ORAL | Status: DC
  Administered 2022-01-11 – 2022-01-19 (×15): 800 mg via ORAL
  Filled 2022-01-10 (×15): qty 1

## 2022-01-10 MED ORDER — ACETAMINOPHEN 650 MG RE SUPP: 650.0000 mg | Freq: Four times a day (QID) | RECTAL | Status: DC | PRN

## 2022-01-10 MED ORDER — LACTATED RINGERS IV BOLUS
500.0000 mL | Freq: Once | INTRAVENOUS | Status: AC
  Administered 2022-01-10: 500 mL via INTRAVENOUS

## 2022-01-10 MED ORDER — ROSUVASTATIN CALCIUM 5 MG PO TABS
5.0000 mg | ORAL_TABLET | Freq: Every evening | ORAL | Status: DC
  Administered 2022-01-10 – 2022-01-18 (×8): 5 mg via ORAL
  Filled 2022-01-10 (×8): qty 1

## 2022-01-10 MED ORDER — FERROUS SULFATE 325 (65 FE) MG PO TABS
325.0000 mg | ORAL_TABLET | Freq: Every day | ORAL | Status: DC
  Administered 2022-01-11 – 2022-01-18 (×8): 325 mg via ORAL
  Filled 2022-01-10 (×8): qty 1

## 2022-01-10 MED ORDER — APIXABAN 5 MG PO TABS
5.0000 mg | ORAL_TABLET | Freq: Two times a day (BID) | ORAL | Status: DC
  Administered 2022-01-10 – 2022-01-18 (×16): 5 mg via ORAL
  Filled 2022-01-10 (×17): qty 1

## 2022-01-10 MED ORDER — SODIUM CHLORIDE 0.9 % IV SOLN: 2.0000 g | Freq: Once | INTRAVENOUS | Status: DC

## 2022-01-10 MED ORDER — LACTATED RINGERS IV BOLUS (SEPSIS)
500.0000 mL | Freq: Once | INTRAVENOUS | Status: AC
  Administered 2022-01-10: 500 mL via INTRAVENOUS

## 2022-01-10 NOTE — Sepsis Progress Note (Signed)
Code Sepsis protocol being monitored by eLink. 

## 2022-01-10 NOTE — Progress Notes (Signed)
Pharmacy Antibiotic Note  [name] is a 66 y.o. male for which pharmacy has been consulted for vancomycin dosing for sepsis.  Patient with a history of ESRD on HD, hx of wound infection with osteo, CHF, A-fib on Eliquis. Patient presenting from [facility] after becoming increasingly weak and febrile today by staff. Reportedly did not receive HD today.  WBC 10.6; LA 2; T 102.5 F; HR 110; RR 26  Plan: Vancomycin 2g once in the ED - subsequent dosing TBD once HD plan in place Metronidazole per MD Cefepime 2g given in ED. Will transition to Cefepime 1g q24hr Trend WBC, Fever, Renal function, & Clinical course F/u cultures, clinical course, WBC, fever De-escalate when able F/u Nephrology plan  Height: '6\' 1"'$  (185.4 cm) Weight: 108.9 kg (240 lb) IBW/kg (Calculated) : 79.9  Temp (24hrs), Avg:102.5 F (39.2 C), Min:102.5 F (39.2 C), Max:102.5 F (39.2 C)  No results for input(s): "WBC", "CREATININE", "LATICACIDVEN", "VANCOTROUGH", "VANCOPEAK", "VANCORANDOM", "GENTTROUGH", "GENTPEAK", "GENTRANDOM", "TOBRATROUGH", "TOBRAPEAK", "TOBRARND", "AMIKACINPEAK", "AMIKACINTROU", "AMIKACIN" in the last 168 hours.  CrCl cannot be calculated (Patient's most recent lab result is older than the maximum 21 days allowed.).    Allergies  Allergen Reactions   Dust Mite Extract Itching and Other (See Comments)    Unknown reaction-potential shortness of breath   Prednisone Nausea And Vomiting   Rocephin [Ceftriaxone] Nausea And Vomiting    Antimicrobials this admission: cefepime 7/18 >>  Metronidazole 7/18 >>  vancomycin 7/18 >>   Microbiology results: Pending  Thank you for allowing pharmacy to be a part of this patient's care.  Lorelei Pont, PharmD, BCPS 01/10/2022 5:29 PM ED Clinical Pharmacist -  [phone]

## 2022-01-10 NOTE — H&P (Signed)
History and Physical    Patient: Danny [name] ATF:[id number] DOB: 09-13-1955 DOA: 01/10/2022 DOS: the patient was seen and examined on 01/10/2022 PCP: Celene Squibb, MD  Patient coming from: SNF  Chief Complaint:  Chief Complaint  Patient presents with   Code Sepsis   HPI: Danny [name] is a 66 y.o. male with medical history significant of hypertension, ESRD (TTS), A-fib on Eliquis who presents to the emergency department via EMS from a nursing facility due to fever and weakness.  Patient states that he went to dialysis this morning, but no fluid was taken off him, he talked of having a hard Taco but then appears to be confused and was unable to provide further history, history was obtained from the ED physician and ED medical record.  Per report, patient was found to be hypoxic with O2 sat in the 80s, supplemental oxygen via [place] at 4 LPM was provided.  Patient was reported to be weak and confused today (different from baseline, good sense of humor, AOx3), he was reported to be fine during shift signout at the nursing facility.    ED Course:  In the emergency department, he was febrile with a temperature of 102.12F, tachypneic, tachycardic.  Work-up in the ED showed normal CBC except for WBC of 10.6 BMP showed sodium 133, potassium 4.2, chloride 96, bicarb 26, glucose 129, BUN 55, creatinine 4.34, calcium 7.7, albumin 2.0, alkaline phosphatase 187, AST 22, ALT 18, total bilirubin 2.2, eGFR 14, lactic acid 2.0, sed rate 100.  Urinalysis was positive for large leukocytes, > 50 WBC, and many bacteria.  Influenza A, B, SARS coronavirus 2 was negative.  Blood culture pending. Chest x-ray showed possible small pleural effusions.  No overt interstitial edema. He was treated with IV cefepime, Flagyl, vancomycin, Zofran was given, IV hydration was provided.  Tylenol was given due to fever.  Patient's BP continued to be in hypotensive range, he was started on peripheral IV Levophed.  Hospitalist was asked to  admit patient for further evaluation and management.    Review of Systems: Review of systems as noted in the HPI. All other systems reviewed and are negative.   Past Medical History:  Diagnosis Date   Anemia    Anemia in ESRD (end-stage renal disease) ([place]) 11/28/2021   Ankle ulcer ([place])    Arthritis    CKD (chronic kidney disease), stage V ([place])    Diabetes mellitus without complication ([place])    Diastolic congestive heart failure ([place])    Foot ulcer ([place])    Gout    Hypertension    Urinary retention    Past Surgical History:  Procedure Laterality Date   ANKLE SURGERY Right    AV FISTULA PLACEMENT Left 04/26/2021   Procedure: LEFT ARM ARTERIOVENOUS (AV) FISTULA CREATION (BRACHIOCEPHALIC);  Surgeon: Rosetta Posner, MD;  Location: [facility];  Service: Vascular;  Laterality: Left;   CHOLECYSTECTOMY     FISTULA SUPERFICIALIZATION Left 07/12/2021   Procedure: LEFT ARM ARTERIOVENOUS FISTULA SUPERFICIALIZATION;  Surgeon: Rosetta Posner, MD;  Location: [facility];  Service: Vascular;  Laterality: Left;   FOOT SURGERY Right    INSERTION OF DIALYSIS CATHETER Right 01/14/2021   Procedure: INSERTION OF TUNNELED DIALYSIS CATHETER RIGHT INTERNAL JUGULAR;  Surgeon: Virl Cagey, MD;  Location: [facility];  Service: General;  Laterality: Right;   REMOVAL OF A DIALYSIS CATHETER N/A 09/06/2021   Procedure: MINOR REMOVAL OF TUNNELED DIALYSIS CATHETER;  Surgeon: Rosetta Posner, MD;  Location: [facility];  Service: Vascular;  Laterality: N/A;   TRANSURETHRAL RESECTION OF PROSTATE N/A 01/15/2020   Procedure: TRANSURETHRAL RESECTION OF THE PROSTATE (TURP)  with General anesthesia and spinal;  Surgeon: Cleon Gustin, MD;  Location: [facility];  Service: Urology;  Laterality: N/A;    Social History:  reports that he quit smoking about 34 years ago. His smoking use included cigarettes. He has a 15.00 pack-year smoking history. He has been exposed to tobacco smoke. He has never used smokeless tobacco. He reports that he  does not currently use alcohol. He reports that he does not use drugs.   Allergies  Allergen Reactions   Dust Mite Extract Itching and Other (See Comments)    Unknown reaction-potential shortness of breath   Prednisone Nausea And Vomiting   Rocephin [Ceftriaxone] Nausea And Vomiting    Family History  Problem Relation Age of Onset   Diabetes Mother    Heart attack Mother    Heart attack Father    Diabetes Brother      Prior to Admission medications   Medication Sig Start Date End Date Taking? Authorizing Provider  cholecalciferol (VITAMIN D3) 25 MCG (1000 UNIT) tablet Take 1,000 Units by mouth See admin instructions. Monday's, Wednesday's, Friday's, and Sunday's in the morning,and Tuesday's, Thursday's, and Saturday's at bedtime   Yes [provider]  docusate sodium (COLACE) 100 MG capsule Take 100 mg by mouth 2 (two) times daily.   Yes [provider]  fluticasone (FLONASE) 50 MCG/ACT nasal spray Place 2 sprays into both nostrils daily. 06/01/21  Yes [provider]  folic acid (FOLVITE) 545 MCG tablet Take 1 tablet (400 mcg total) by mouth daily. 08/29/21  Yes Pennington, Rebekah M, PA-C  Insulin NPH Isophane & Regular (HUMULIN 70/30 KWIKPEN [place]) Inject 5 Units into the skin 2 (two) times daily.   Yes [provider]  Lactulose 20 GM/30ML SOLN Take 30 mLs (20 g total) by mouth daily. 11/11/20  Yes [name], MD  metoprolol succinate (TOPROL XL) 50 MG 24 hr tablet Take 1 tablet (50 mg total) by mouth in the morning and at bedtime. Take with or immediately following a meal. 08/15/21 08/15/22 Yes Barton Dubois, MD  midodrine (PROAMATINE) 10 MG tablet Take 10 mg by mouth Every Tuesday,Thursday,and Saturday with dialysis. 08/06/21  Yes [provider]  Multiple Vitamin (MULTIVITAMIN WITH MINERALS) TABS tablet Take 1 tablet by mouth daily.   Yes [provider]  Omega-3 Fatty Acids (FISH OIL) 500 MG CAPS Take 500 mg by mouth 2 (two)  times daily.   Yes [provider]  ondansetron (ZOFRAN) 4 MG tablet Take 4 mg by mouth every 6 (six) hours as needed for nausea or vomiting.   Yes [provider]  OXYCODONE HCL PO Take 5 mg by mouth every 6 (six) hours as needed (SEVERE PAIN).   Yes [provider]  polyethylene glycol (MIRALAX / GLYCOLAX) 17 g packet Take 17 g by mouth daily. 01/28/21  Yes Kathie Dike, MD  rosuvastatin (CRESTOR) 5 MG tablet Take 5 mg by mouth every evening.   Yes [provider]  senna (SENOKOT) 8.6 MG tablet Take 2 tablets by mouth 2 (two) times daily.   Yes [provider]  sevelamer carbonate (RENVELA) 800 MG tablet Take 1 tablet (800 mg total) by mouth 2 (two) times daily with a meal. 01/27/21  Yes Kathie Dike, MD  tamsulosin (FLOMAX) 0.4 MG CAPS capsule Take 1 capsule (0.4 mg total) by mouth daily after supper.  09/09/21  Yes [name], [name], MD  vitamin C (ASCORBIC ACID) 500 MG tablet Take 1,000 mg by mouth 2 (two) times daily.   Yes [provider]  vitamin E 1000 UNIT capsule Take 1,000 Units by mouth See admin instructions. Monday's, Wednesday's, Friday's, and Sunday's in the morning, and Tuesday's, Thursday's, and Saturday's at bedtime.   Yes [provider]  zinc sulfate 220 (50 Zn) MG capsule Take 220 mg by mouth daily.   Yes [provider]  acetaminophen (TYLENOL) 500 MG tablet Take 1,000 mg by mouth every 6 (six) hours as needed for moderate pain or headache.    [provider]  apixaban (ELIQUIS) 5 MG TABS tablet Take 1 tablet (5 mg total) by mouth 2 (two) times daily. 01/27/21   Kathie Dike, MD  cephALEXin (KEFLEX) 500 MG capsule Take 1 capsule (500 mg total) by mouth 4 (four) times daily. Patient not taking: Reported on 01/10/2022 12/16/21   Roemhildt, Lorin T, PA-C  Cyanocobalamin (B-12 COMPLIANCE INJECTION) 1000 MCG/ML KIT Inject 1,000 mcg as directed every 28 (twenty-eight) days. Patient not taking: Reported  on 01/10/2022    [provider]  ferrous sulfate 325 (65 FE) MG tablet Take 325 mg by mouth 2 (two) times daily.    [provider]    Physical Exam: BP (!) 98/49 (BP Location: Right Arm)   Pulse (!) 113   Temp 97.7 F (36.5 C) (Oral)   Resp 18   Ht 6' 1"  (1.854 m)   Wt 108.9 kg   SpO2 100%   BMI 31.66 kg/m   General: 66 y.o. year-old male ill appearing, confused, but in no acute distress.  Alert and oriented x3. HEENT: NCAT, EOMI Neck: Supple, trachea medial Cardiovascular: Irregular rate and rhythm with no rubs or gallops.  No thyromegaly or JVD noted.  Bilateral lower extremity edema. 2/4 pulses in all 4 extremities. Respiratory: Bilateral mild Rales in lower lobes.  No rales  Abdomen: Soft, nontender nondistended with normal bowel sounds x4 quadrants. Muskuloskeletal: Pressure ulcer on lateral side of right foot.  Worsening erythema warm to touch surrounding wound on right foot and extending to the shin.  No cyanosis or clubbing noted bilaterally Neuro: CN II-XII intact, strength 5/5 x 4, sensation, reflexes intact Skin: Wound on lateral side of right foot, erythema surrounding the wound and extending to the shin on the right leg Psychiatry: Mood is appropriate for condition and setting           Labs on Admission:  Basic Metabolic Panel: Recent Labs  Lab 01/10/22 1713  NA 133*  K 4.2  CL 96*  CO2 26  GLUCOSE 129*  BUN 55*  CREATININE 4.34*  CALCIUM 7.7*   Liver Function Tests: Recent Labs  Lab 01/10/22 1713  AST 21  ALT 18  ALKPHOS 187*  BILITOT 2.2*  PROT 7.4  ALBUMIN 2.0*   No results for input(s): "LIPASE", "AMYLASE" in the last 168 hours. No results for input(s): "AMMONIA" in the last 168 hours. CBC: Recent Labs  Lab 01/10/22 1713  WBC 10.6*  NEUTROABS 10.1*  HGB 9.6*  HCT 29.7*  MCV 84.1  PLT 251   Cardiac Enzymes: No results for input(s): "CKTOTAL", "CKMB", "CKMBINDEX", "TROPONINI" in the last 168 hours.  BNP (last 3  results) Recent Labs    01/12/21 1202 08/12/21 0939  BNP 329.0* 466.0*    ProBNP (last 3 results) No results for input(s): "PROBNP" in the last 8760 hours.  CBG: No results  for input(s): "GLUCAP" in the last 168 hours.  Radiological Exams on Admission: DG Chest Port 1 View  Result Date: 01/10/2022 CLINICAL DATA:  Questionable sepsis, Pt is a MWF dialysis pt who went today and reportedly did not receive dialysis, or did not have any fluid off (conflicting stories). Pt has been increasingly weak and today staff noticed him to be febrile. EXAM: PORTABLE CHEST - 1 VIEW COMPARISON:  12/16/2021 FINDINGS: Lungs are clear. Heart size and mediastinal contours are within normal limits. Blunting of lateral costophrenic angles suggesting small effusions. Visualized bones unremarkable. IMPRESSION: Possible small pleural effusions.  No overt interstitial edema. Electronically Signed   By: Lucrezia Europe M.D.   On: 01/10/2022 17:33    EKG: I independently viewed the EKG done and my findings are as followed: EKG was not done in the ED  Assessment/Plan Present on Admission:  Acute metabolic encephalopathy  Cellulitis, leg  Iron deficiency anemia  Principal Problem:   Acute metabolic encephalopathy Active Problems:   Cellulitis, leg   ESRD on dialysis [facility])   Iron deficiency anemia   Atrial fibrillation, chronic (HCC)   Type 2 diabetes mellitus (HCC)   Obesity (BMI 30-39.9)   Sepsis ([place])   Mixed hyperlipidemia   Hypotension   Open wound of right foot   Lactic acidosis  Acute metabolic encephalopathy Patient was able to maintain some conversation, but he gets confused and starts to repeat himself This may be due to patient's septic state Continue treatment for the sepsis Continue fall precaution and neurochecks  Sepsis secondary to presumed right leg cellulitis Patient was tachypneic, tachycardic and febrile (met SIRS criteria), source of infection appears to be right leg cellulitis.  IV  hydration per sepsis protocol cannot be provided due to patient being a dialysis patient.  He was started on IV Levophed Patient was treated with IV vancomycin, Flagyl and cefepime, we shall continue with IV vancomycin and cefepime Continue Tylenol as needed for fever Continue peripheral IV Levophed to maintain a MAP of > 65   Acute respiratory failure with hypoxia Due to unknown cause at this time Continue supplemental oxygen to maintain O2 sat > 94%  Hypotension Continue midodrine  Right foot wound Continue wound care  Lactic acidosis Lactic acid 2.0, continue to trend lactic acid  ESRD on HD (TTS) Patient states that he had dialysis today but no fluid was taken off him Continue [facility] consulted for maintenance dialysis  A-fib on Eliquis Continue Eliquis Toprol-XL will be held at this time due to hypotension  Type 2 diabetes mellitus Continue ISS and hypoglycemia protocol  Iron deficiency anemia Continue ferrous sulfate Continue senna to prevent constipation  Mixed hyperlipidemia Continue statin Continue [place]  Obesity (BMI 32.66 kgm) Diet and lifestyle modification  Goals of care: Palliative care will be consulted  DVT prophylaxis: Eliquis  Code Status: Full code  Consults: Palliative care  Family Communication: None at bedside  Severity of Illness: The appropriate patient status for this patient is INPATIENT. Inpatient status is judged to be reasonable and necessary in order to provide the required intensity of service to ensure the patient's safety. The patient's presenting symptoms, physical exam findings, and initial radiographic and laboratory data in the context of their chronic comorbidities is felt to place them at high risk for further clinical deterioration. Furthermore, it is not anticipated that the patient will be medically stable for discharge from the hospital within 2 midnights of admission.   * I certify that at the point of  admission it is my clinical judgment that the patient will require inpatient hospital care spanning beyond 2 midnights from the point of admission due to high intensity of service, high risk for further deterioration and high frequency of surveillance required.*  Author: Bernadette Hoit, DO 01/10/2022 10:10 PM  For on call review [email or web address].

## 2022-01-10 NOTE — Sepsis Progress Note (Signed)
Handoff received from [facility], RN; sepsis monitoring continued.

## 2022-01-10 NOTE — ED Provider Notes (Addendum)
[facility] EMERGENCY DEPARTMENT Provider Note   CSN: [id number] Arrival date & time: 01/10/22  1645     History  Chief Complaint  Patient presents with   Code Sepsis    Danny [name] is a 66 y.o. male.  HPI     66 year old patient comes in with chief complaint of weakness, fevers.  Patient has history of diabetes, hypertension, ESRD on HD, CHF, A-fib on Eliquis and pressure ulcer.  Patient is oriented to self and location.  He appears confused.  Patient alleges that yesterday he felt well.  He also indicates that he has a cough that is been present for a long time.  He started feeling weak today.  Per nursing staff, patient was found to be hypoxic with O2 sats in the 80s, therefore they put him on 4 L of oxygen.  Patient denies any chest pain or shortness of breath.  Patient is unclear if he had hemodialysis today.  Initially he stated that he did not get dialysis, then he stated that he was hooked to the machine for 2 hours.  He resides at [facility].  I spoke with the nursing home team.  They indicate that at baseline patient is AOx3, has great sense of humor.  Today that was not the case, he was weak and Appeared confused.  The nurse taking care of him today states that during signout they were informed he was fine.  Patient told her that during dialysis he was only hooked for couple of hours, but there was no fluid taken.  Home Medications Prior to Admission medications   Medication Sig Start Date End Date Taking? Authorizing Provider  cholecalciferol (VITAMIN D3) 25 MCG (1000 UNIT) tablet Take 1,000 Units by mouth See admin instructions. Monday's, Wednesday's, Friday's, and Sunday's in the morning,and Tuesday's, Thursday's, and Saturday's at bedtime   Yes [provider]  docusate sodium (COLACE) 100 MG capsule Take 100 mg by mouth 2 (two) times daily.   Yes [provider]  fluticasone (FLONASE) 50 MCG/ACT nasal spray Place 2 sprays into both  nostrils daily. 06/01/21  Yes [provider]  folic acid (FOLVITE) 166 MCG tablet Take 1 tablet (400 mcg total) by mouth daily. 08/29/21  Yes Pennington, Rebekah M, PA-C  Insulin NPH Isophane & Regular (HUMULIN 70/30 KWIKPEN [place]) Inject 5 Units into the skin 2 (two) times daily.   Yes [provider]  Lactulose 20 GM/30ML SOLN Take 30 mLs (20 g total) by mouth daily. 11/11/20  Yes [name], MD  metoprolol succinate (TOPROL XL) 50 MG 24 hr tablet Take 1 tablet (50 mg total) by mouth in the morning and at bedtime. Take with or immediately following a meal. 08/15/21 08/15/22 Yes Barton Dubois, MD  midodrine (PROAMATINE) 10 MG tablet Take 10 mg by mouth Every Tuesday,Thursday,and Saturday with dialysis. 08/06/21  Yes [provider]  Multiple Vitamin (MULTIVITAMIN WITH MINERALS) TABS tablet Take 1 tablet by mouth daily.   Yes [provider]  Omega-3 Fatty Acids (FISH OIL) 500 MG CAPS Take 500 mg by mouth 2 (two) times daily.   Yes [provider]  ondansetron (ZOFRAN) 4 MG tablet Take 4 mg by mouth every 6 (six) hours as needed for nausea or vomiting.   Yes [provider]  OXYCODONE HCL PO Take 5 mg by mouth every 6 (six) hours as needed (SEVERE PAIN).   Yes [provider]  polyethylene glycol (MIRALAX / GLYCOLAX) 17 g packet Take 17 g  by mouth daily. 01/28/21  Yes Kathie Dike, MD  rosuvastatin (CRESTOR) 5 MG tablet Take 5 mg by mouth every evening.   Yes [provider]  senna (SENOKOT) 8.6 MG tablet Take 2 tablets by mouth 2 (two) times daily.   Yes [provider]  sevelamer carbonate (RENVELA) 800 MG tablet Take 1 tablet (800 mg total) by mouth 2 (two) times daily with a meal. 01/27/21  Yes Kathie Dike, MD  tamsulosin (FLOMAX) 0.4 MG CAPS capsule Take 1 capsule (0.4 mg total) by mouth daily after supper. 09/09/21  Yes [name], [name], MD  vitamin C (ASCORBIC ACID) 500 MG tablet Take 1,000 mg by mouth 2  (two) times daily.   Yes [provider]  vitamin E 1000 UNIT capsule Take 1,000 Units by mouth See admin instructions. Monday's, Wednesday's, Friday's, and Sunday's in the morning, and Tuesday's, Thursday's, and Saturday's at bedtime.   Yes [provider]  zinc sulfate 220 (50 Zn) MG capsule Take 220 mg by mouth daily.   Yes [provider]  acetaminophen (TYLENOL) 500 MG tablet Take 1,000 mg by mouth every 6 (six) hours as needed for moderate pain or headache.    [provider]  apixaban (ELIQUIS) 5 MG TABS tablet Take 1 tablet (5 mg total) by mouth 2 (two) times daily. 01/27/21   Kathie Dike, MD  cephALEXin (KEFLEX) 500 MG capsule Take 1 capsule (500 mg total) by mouth 4 (four) times daily. Patient not taking: Reported on 01/10/2022 12/16/21   Roemhildt, Lorin T, PA-C  Cyanocobalamin (B-12 COMPLIANCE INJECTION) 1000 MCG/ML KIT Inject 1,000 mcg as directed every 28 (twenty-eight) days. Patient not taking: Reported on 01/10/2022    [provider]  ferrous sulfate 325 (65 FE) MG tablet Take 325 mg by mouth 2 (two) times daily.    [provider]      Allergies    Dust mite extract, Prednisone, and Rocephin [ceftriaxone]    Review of Systems   Review of Systems  Unable to perform ROS: Mental status change    Physical Exam Updated Vital Signs BP (!) 91/55   Pulse 93   Temp 100.3 F (37.9 C) (Oral)   Resp 15   Ht 6' 1" (1.854 m)   Wt 108.9 kg   SpO2 100%   BMI 31.66 kg/m  Physical Exam Vitals and nursing note reviewed.  Constitutional:      Appearance: He is well-developed.     Comments: Somnolent  HENT:     Head: Atraumatic.  Eyes:     Pupils: Pupils are equal, round, and reactive to light.  Cardiovascular:     Rate and Rhythm: Normal rate.  Pulmonary:     Effort: Pulmonary effort is normal.     Breath sounds: Rhonchi present.     Comments: Diminished breath sounds on the right lower lung, mild rhonchi/rales in the  left lower lung field Musculoskeletal:     Cervical back: Neck supple.     Right lower leg: No edema.     Left lower leg: No edema.     Comments: Pressure ulcer over the sacrum, no purulent drainage  Skin:    General: Skin is warm.     Findings: Erythema present.  Neurological:     Mental Status: He is disoriented.     ED Results / Procedures / Treatments   Labs (all labs ordered are listed, but only abnormal results are displayed) Labs Reviewed  LACTIC ACID, PLASMA - Abnormal; Notable  for the following components:      Result Value   Lactic Acid, Venous 2.0 (*)    All other components within normal limits  COMPREHENSIVE METABOLIC PANEL - Abnormal; Notable for the following components:   Sodium 133 (*)    Chloride 96 (*)    Glucose, Bld 129 (*)    BUN 55 (*)    Creatinine, Ser 4.34 (*)    Calcium 7.7 (*)    Albumin 2.0 (*)    Alkaline Phosphatase 187 (*)    Total Bilirubin 2.2 (*)    GFR, Estimated 14 (*)    All other components within normal limits  CBC WITH DIFFERENTIAL/PLATELET - Abnormal; Notable for the following components:   WBC 10.6 (*)    RBC 3.53 (*)    Hemoglobin 9.6 (*)    HCT 29.7 (*)    RDW 19.9 (*)    Neutro Abs 10.1 (*)    Lymphs Abs 0.1 (*)    Abs Immature Granulocytes 0.17 (*)    All other components within normal limits  PROTIME-INR - Abnormal; Notable for the following components:   Prothrombin Time 20.9 (*)    INR 1.8 (*)    All other components within normal limits  APTT - Abnormal; Notable for the following components:   aPTT 37 (*)    All other components within normal limits  URINALYSIS, ROUTINE W REFLEX MICROSCOPIC - Abnormal; Notable for the following components:   APPearance TURBID (*)    Hgb urine dipstick SMALL (*)    Protein, ur 100 (*)    Leukocytes,Ua LARGE (*)    WBC, UA >50 (*)    Bacteria, UA MANY (*)    All other components within normal limits  SEDIMENTATION RATE - Abnormal; Notable for the following components:   Sed  Rate 100 (*)    All other components within normal limits  RESP PANEL BY RT-PCR (FLU A&B, COVID) ARPGX2  CULTURE, BLOOD (ROUTINE X 2)  CULTURE, BLOOD (ROUTINE X 2)  URINE CULTURE  LACTIC ACID, PLASMA  C-REACTIVE PROTEIN  COMPREHENSIVE METABOLIC PANEL  CBC  MAGNESIUM  PHOSPHORUS    EKG None  Radiology DG Chest Port 1 View  Result Date: 01/10/2022 CLINICAL DATA:  Questionable sepsis, Pt is a MWF dialysis pt who went today and reportedly did not receive dialysis, or did not have any fluid off (conflicting stories). Pt has been increasingly weak and today staff noticed him to be febrile. EXAM: PORTABLE CHEST - 1 VIEW COMPARISON:  12/16/2021 FINDINGS: Lungs are clear. Heart size and mediastinal contours are within normal limits. Blunting of lateral costophrenic angles suggesting small effusions. Visualized bones unremarkable. IMPRESSION: Possible small pleural effusions.  No overt interstitial edema. Electronically Signed   By: Lucrezia Europe M.D.   On: 01/10/2022 17:33    Procedures .Critical Care  Performed by: Varney Biles, MD Authorized by: Varney Biles, MD   Critical care provider statement:    Critical care time (minutes):  79   Critical care was necessary to treat or prevent imminent or life-threatening deterioration of the following conditions:  CNS failure or compromise and sepsis   Critical care was time spent personally by me on the following activities:  Development of treatment plan with patient or surrogate, discussions with consultants, evaluation of patient's response to treatment, examination of patient, ordering and review of laboratory studies, ordering and review of radiographic studies, ordering and performing treatments and interventions, pulse oximetry, re-evaluation of patient's condition, review of old charts and  obtaining history from patient or surrogate   I assumed direction of critical care for this patient from another provider in my specialty: yes        Medications Ordered in ED Medications  lactated ringers infusion ( Intravenous New Bag/Given 01/10/22 1920)  ceFEPIme (MAXIPIME) 1 g in sodium chloride 0.9 % 100 mL IVPB (has no administration in time range)  acetaminophen (TYLENOL) tablet 650 mg (has no administration in time range)    Or  acetaminophen (TYLENOL) suppository 650 mg (has no administration in time range)  ondansetron (ZOFRAN) tablet 4 mg (has no administration in time range)    Or  ondansetron (ZOFRAN) injection 4 mg (has no administration in time range)  0.9 %  sodium chloride infusion (0 mLs Intravenous Hold 01/10/22 2059)  norepinephrine (LEVOPHED) 43m in 2528m(0.016 mg/mL) premix infusion (6 mcg/min Intravenous Rate/Dose Change 01/10/22 2102)  vasopressin (PITRESSIN) 20 Units in sodium chloride 0.9 % 100 mL infusion-*FOR SHOCK* (0 Units/min Intravenous Hold 01/10/22 2118)  metroNIDAZOLE (FLAGYL) IVPB 500 mg (0 mg Intravenous Stopped 01/10/22 1912)  ceFEPIme (MAXIPIME) 2 g in sodium chloride 0.9 % 100 mL IVPB (0 g Intravenous Stopped 01/10/22 1804)  vancomycin (VANCOREADY) IVPB 2000 mg/400 mL (2,000 mg Intravenous New Bag/Given 01/10/22 1918)  acetaminophen (TYLENOL) tablet 650 mg (650 mg Oral Given 01/10/22 1741)  lactated ringers bolus 500 mL (0 mLs Intravenous Stopped 01/10/22 2029)  lactated ringers bolus 500 mL (0 mLs Intravenous Stopped 01/10/22 2112)    ED Course/ Medical Decision Making/ A&P Clinical Course as of 01/10/22 2122  Tue Jan 10, 2022  1908 ED EKG 12-Lead [AN]  1924 BP(!): 85/50 Patient's blood pressure dropped to 80s over 50s.  He is also now in A-fib with RVR.  Giving him 500 cc bolus.  It appears that RVR and low blood pressure occurred at the same time, therefore my suspicion is that hypotension is primarily driven by poor atrial kick due to RVR.  A-fib of course is primarily driven by sepsis itself.  Management of the BP will be to treat underlying sepsis.  We will be give him bolus as needed. [AN]   2117 Patient's heart rate is improved.  Blood pressure is still low.  We will start him on vasopressin and norepinephrine.  The combination is because norepinephrine is the preferred first-line agent for septic shock with multiple trials showing better outcomes with it compared to other agents.  Vasopressin is a good second line agent that I think will be helpful starting upfront given patient has had some A-fib and RVR, this will reduce the need for total Nor epi. [AN]  2118 Additionally, patient's right leg does appear more red and more warm compared to left side.  Agree with the hospitalist that perhaps the source is cellulitis. [AN]  2122 Sepsis reassessment completed.  Patient's blood pressure has improved with norepinephrine.  Per nurse, vasopressin has not been initiated as patient is on very low-dose nor epi right now [AN]    Clinical Course User Index [AN] NaVarney BilesMD                           Medical Decision Making Amount and/or Complexity of Data Reviewed Labs: ordered. Radiology: ordered. ECG/medicine tests: ordered. Decision-making details documented in ED Course.  Risk OTC drugs. Prescription drug management. Decision regarding hospitalization.   This patient presents to the ED with chief complaint(s) of confusion, weakness with pertinent past medical history of A-fib  on Eliquis, ESRD on hemodialysis, pressure ulcers, diabetes which further complicates the presenting complaint. The complaint involves an extensive differential diagnosis and also carries with it a high risk of complications and morbidity.    Patient noted to be tachypneic, tachycardic and febrile at arrival. He also is noted to be hypoxic, requiring 4 L of oxygen.  Asymmetric breath sounds.  The differential diagnosis includes severe sepsis secondary to pneumonia or bacteremia or UTI or soft tissue infection. Patient is disoriented, but following commands.  I do not think he has acute brain  bleed. Severe electrolyte abnormality including hypokalemia, uremia also possible. Pulmonary embolism considered, but lower in the pretest probability given that he is on Eliquis for A-fib.  Patient has no signs of DVT on exam.  The initial plan is to initiate sepsis work-up.  Patient is undifferentiated, and with high risk of bacteremia is a possibility we will start broad-spectrum antibiotics.   Additional history obtained: Additional history obtained from EMS  and nursing home/care facility Records reviewed previous admission documents  Independent labs interpretation:  The following labs were independently interpreted: \Patient has mild lactic acidosis, slightly elevated white count, baseline renal function with elevated BUN and creatinine.  Patient has slightly elevated alk phos and hypocalcemia, elevated bilirubin.  He had a CT scan in April that indicates that he is status postcholecystectomy.  Based on our evaluation, does not appear to be cholangitis.  Independent visualization of imaging: - I independently visualized the following imaging with scope of interpretation limited to determining acute life threatening conditions related to emergency care: X-ray of the chest, which revealed right pleural effusion  Treatment and Reassessment: Patient remains stable hemodynamically.  Still tachycardic.  No hypotension. Maintenance fluid along with antibiotics given.  Stable for admission at this time.  Final Clinical Impression(s) / ED Diagnoses Final diagnoses:  Acute encephalopathy  Severe sepsis ([place])  Acute hypoxemic respiratory failure [facility])    Rx / DC Orders ED Discharge Orders     None          Varney Biles, MD 01/10/22 2119    Varney Biles, MD 01/10/22 2122

## 2022-01-10 NOTE — ED Triage Notes (Signed)
Pt arrives via [place] from [facility]. Pt is a MWF dialysis pt who went today and reportedly did not receive dialysis, or did not have any fluid off (conflicting stories). Pt has been increasingly weak and today staff noticed him to be febrile.   On arrival to ED pt has rectal temp of 102.5, HR 114, SPO2 79-83% on RA.

## 2022-01-11 ENCOUNTER — Inpatient Hospital Stay (HOSPITAL_COMMUNITY): Payer: Medicare Other

## 2022-01-11 DIAGNOSIS — A419 Sepsis, unspecified organism: Secondary | ICD-10-CM

## 2022-01-11 DIAGNOSIS — Z515 Encounter for palliative care: Secondary | ICD-10-CM | POA: Diagnosis not present

## 2022-01-11 DIAGNOSIS — R6521 Severe sepsis with septic shock: Secondary | ICD-10-CM | POA: Diagnosis not present

## 2022-01-11 DIAGNOSIS — Z7189 Other specified counseling: Secondary | ICD-10-CM | POA: Diagnosis not present

## 2022-01-11 LAB — COMPREHENSIVE METABOLIC PANEL
ALT: 18 U/L (ref 0–44)
ALT: 19 U/L (ref 0–44)
AST: 21 U/L (ref 15–41)
AST: 21 U/L (ref 15–41)
Albumin: 1.8 g/dL — ABNORMAL LOW (ref 3.5–5.0)
Albumin: 1.8 g/dL — ABNORMAL LOW (ref 3.5–5.0)
Alkaline Phosphatase: 162 U/L — ABNORMAL HIGH (ref 38–126)
Alkaline Phosphatase: 168 U/L — ABNORMAL HIGH (ref 38–126)
Anion gap: 12 (ref 5–15)
Anion gap: 12 (ref 5–15)
BUN: 52 mg/dL — ABNORMAL HIGH (ref 8–23)
BUN: 58 mg/dL — ABNORMAL HIGH (ref 8–23)
CO2: 21 mmol/L — ABNORMAL LOW (ref 22–32)
CO2: 20 mmol/L — ABNORMAL LOW (ref 22–32)
Calcium: 7.3 mg/dL — ABNORMAL LOW (ref 8.9–10.3)
Calcium: 7.5 mg/dL — ABNORMAL LOW (ref 8.9–10.3)
Chloride: 95 mmol/L — ABNORMAL LOW (ref 98–111)
Chloride: 99 mmol/L (ref 98–111)
Creatinine, Ser: 4.26 mg/dL — ABNORMAL HIGH (ref 0.61–1.24)
Creatinine, Ser: 4.49 mg/dL — ABNORMAL HIGH (ref 0.61–1.24)
GFR, Estimated: 14 mL/min — ABNORMAL LOW (ref >60)
GFR, Estimated: 14 mL/min — ABNORMAL LOW (ref >60)
Glucose, Bld: 314 mg/dL — ABNORMAL HIGH (ref 70–99)
Glucose, Bld: 204 mg/dL — ABNORMAL HIGH (ref 70–99)
Potassium: 4.3 mmol/L (ref 3.5–5.1)
Potassium: 4.7 mmol/L (ref 3.5–5.1)
Sodium: 128 mmol/L — ABNORMAL LOW (ref 135–145)
Sodium: 131 mmol/L — ABNORMAL LOW (ref 135–145)
Total Bilirubin: 2.5 mg/dL — ABNORMAL HIGH (ref 0.3–1.2)
Total Bilirubin: 2.5 mg/dL — ABNORMAL HIGH (ref 0.3–1.2)
Total Protein: 6.5 g/dL (ref 6.5–8.1)
Total Protein: 6.7 g/dL (ref 6.5–8.1)

## 2022-01-11 LAB — CBC WITH DIFFERENTIAL/PLATELET
Abs Immature Granulocytes: 1.74 10*3/uL — ABNORMAL HIGH (ref 0.00–0.07)
Basophils Absolute: 0.1 10*3/uL (ref 0.0–0.1)
Basophils Relative: 1 %
Eosinophils Absolute: 0 10*3/uL (ref 0.0–0.5)
Eosinophils Relative: 0 %
HCT: 30.2 % — ABNORMAL LOW (ref 39.0–52.0)
Hemoglobin: 9.5 g/dL — ABNORMAL LOW (ref 13.0–17.0)
Immature Granulocytes: 9 %
Lymphocytes Relative: 2 %
Lymphs Abs: 0.3 10*3/uL — ABNORMAL LOW (ref 0.7–4.0)
MCH: 26.8 pg (ref 26.0–34.0)
MCHC: 31.5 g/dL (ref 30.0–36.0)
MCV: 85.1 fL (ref 80.0–100.0)
Monocytes Absolute: 0.4 10*3/uL (ref 0.1–1.0)
Monocytes Relative: 2 %
Neutro Abs: 16.6 10*3/uL — ABNORMAL HIGH (ref 1.7–7.7)
Neutrophils Relative %: 86 %
Platelets: 280 10*3/uL (ref 150–400)
RBC: 3.55 MIL/uL — ABNORMAL LOW (ref 4.22–5.81)
RDW: 20 % — ABNORMAL HIGH (ref 11.5–15.5)
WBC: 19.1 10*3/uL — ABNORMAL HIGH (ref 4.0–10.5)
nRBC: 0 % (ref 0.0–0.2)

## 2022-01-11 LAB — CBC
HCT: 29.3 % — ABNORMAL LOW (ref 39.0–52.0)
Hemoglobin: 9.2 g/dL — ABNORMAL LOW (ref 13.0–17.0)
MCH: 26.7 pg (ref 26.0–34.0)
MCHC: 31.4 g/dL (ref 30.0–36.0)
MCV: 84.9 fL (ref 80.0–100.0)
Platelets: 261 10*3/uL (ref 150–400)
RBC: 3.45 MIL/uL — ABNORMAL LOW (ref 4.22–5.81)
RDW: 19.8 % — ABNORMAL HIGH (ref 11.5–15.5)
WBC: 20.5 10*3/uL — ABNORMAL HIGH (ref 4.0–10.5)
nRBC: 0 % (ref 0.0–0.2)

## 2022-01-11 LAB — BLOOD CULTURE ID PANEL (REFLEXED) - BCID2

## 2022-01-11 LAB — PROTIME-INR
INR: 2.8 — ABNORMAL HIGH (ref 0.8–1.2)
Prothrombin Time: 29.6 seconds — ABNORMAL HIGH (ref 11.4–15.2)

## 2022-01-11 LAB — HEPATITIS B CORE ANTIBODY, TOTAL: Hep B Core Total Ab: NONREACTIVE

## 2022-01-11 LAB — HEPATITIS B SURFACE ANTIGEN: Hepatitis B Surface Ag: NONREACTIVE

## 2022-01-11 LAB — LACTIC ACID, PLASMA
Lactic Acid, Venous: 2.4 mmol/L (ref 0.5–1.9)
Lactic Acid, Venous: 2.2 mmol/L (ref 0.5–1.9)
Lactic Acid, Venous: 2.4 mmol/L (ref 0.5–1.9)

## 2022-01-11 LAB — GLUCOSE, CAPILLARY
Glucose-Capillary: 210 mg/dL — ABNORMAL HIGH (ref 70–99)
Glucose-Capillary: 197 mg/dL — ABNORMAL HIGH (ref 70–99)
Glucose-Capillary: 197 mg/dL — ABNORMAL HIGH (ref 70–99)

## 2022-01-11 LAB — HEMOGLOBIN A1C
Hgb A1c MFr Bld: 6.2 % — ABNORMAL HIGH (ref 4.8–5.6)
Mean Plasma Glucose: 131.24 mg/dL

## 2022-01-11 LAB — APTT: aPTT: 38 seconds — ABNORMAL HIGH (ref 24–36)

## 2022-01-11 LAB — MRSA NEXT GEN BY PCR, NASAL: MRSA by PCR Next Gen: DETECTED — AB

## 2022-01-11 LAB — PHOSPHORUS: Phosphorus: 4.5 mg/dL (ref 2.5–4.6)

## 2022-01-11 LAB — HEPATITIS C ANTIBODY: HCV Ab: NONREACTIVE

## 2022-01-11 LAB — CBG MONITORING, ED: Glucose-Capillary: 188 mg/dL — ABNORMAL HIGH (ref 70–99)

## 2022-01-11 LAB — HEPATITIS B SURFACE ANTIBODY,QUALITATIVE: Hep B S Ab: NONREACTIVE

## 2022-01-11 LAB — MAGNESIUM: Magnesium: 2 mg/dL (ref 1.7–2.4)

## 2022-01-11 MED ORDER — SODIUM CHLORIDE 0.9 % IV SOLN
250.0000 mL | INTRAVENOUS | Status: DC
  Administered 2022-01-11: 250 mL via INTRAVENOUS

## 2022-01-11 MED ORDER — PHENYLEPHRINE HCL-NACL 20-0.9 MG/250ML-% IV SOLN
INTRAVENOUS | Status: AC
  Administered 2022-01-11: 20 mg
  Filled 2022-01-11: qty 500

## 2022-01-11 MED ORDER — CHLORHEXIDINE GLUCONATE CLOTH 2 % EX PADS
6.0000 | MEDICATED_PAD | Freq: Every day | CUTANEOUS | Status: DC
  Administered 2022-01-12 – 2022-01-19 (×5): 6 via TOPICAL

## 2022-01-11 MED ORDER — LEVALBUTEROL HCL 0.63 MG/3ML IN NEBU
0.6300 mg | INHALATION_SOLUTION | RESPIRATORY_TRACT | Status: DC | PRN
  Administered 2022-01-11 – 2022-01-16 (×2): 0.63 mg via RESPIRATORY_TRACT
  Filled 2022-01-11 (×2): qty 3

## 2022-01-11 MED ORDER — MUPIROCIN 2 % EX OINT
TOPICAL_OINTMENT | Freq: Two times a day (BID) | CUTANEOUS | Status: DC
  Administered 2022-01-12 – 2022-01-15 (×2): 1 via NASAL
  Filled 2022-01-11 (×3): qty 22

## 2022-01-11 MED ORDER — SODIUM CHLORIDE 0.9 % IV SOLN
25.0000 ug/min | INTRAVENOUS | Status: DC
  Administered 2022-01-11: 25 ug/min via INTRAVENOUS
  Filled 2022-01-11: qty 2

## 2022-01-11 MED ORDER — MIDODRINE HCL 5 MG PO TABS
10.0000 mg | ORAL_TABLET | Freq: Two times a day (BID) | ORAL | Status: DC
  Administered 2022-01-11 – 2022-01-17 (×14): 10 mg via ORAL
  Filled 2022-01-11 (×15): qty 2

## 2022-01-11 MED ORDER — SODIUM CHLORIDE 0.9 % IV BOLUS
500.0000 mL | Freq: Once | INTRAVENOUS | Status: AC
  Administered 2022-01-11: 500 mL via INTRAVENOUS

## 2022-01-11 MED ORDER — PHENYLEPHRINE HCL-NACL 20-0.9 MG/250ML-% IV SOLN
25.0000 ug/min | INTRAVENOUS | Status: DC
  Administered 2022-01-11: 125 ug/min via INTRAVENOUS
  Administered 2022-01-11: 50 ug/min via INTRAVENOUS
  Filled 2022-01-11 (×2): qty 250
  Filled 2022-01-11: qty 500
  Filled 2022-01-11 (×3): qty 250

## 2022-01-11 MED ORDER — PHENYLEPHRINE HCL-NACL 20-0.9 MG/250ML-% IV SOLN
INTRAVENOUS | Status: AC
  Filled 2022-01-11: qty 250

## 2022-01-11 MED ORDER — METRONIDAZOLE 500 MG/100ML IV SOLN
500.0000 mg | Freq: Two times a day (BID) | INTRAVENOUS | Status: DC
  Administered 2022-01-11 – 2022-01-12 (×3): 500 mg via INTRAVENOUS
  Filled 2022-01-11 (×3): qty 100

## 2022-01-11 MED ORDER — VANCOMYCIN HCL IN DEXTROSE 1-5 GM/200ML-% IV SOLN: 1000.0000 mg | INTRAVENOUS | Status: DC

## 2022-01-11 MED ORDER — SALINE SPRAY 0.65 % NA SOLN: 1.0000 | NASAL | Status: DC | PRN

## 2022-01-11 MED ORDER — PHENYLEPHRINE HCL-NACL 20-0.9 MG/250ML-% IV SOLN
INTRAVENOUS | Status: AC
  Filled 2022-01-11: qty 500

## 2022-01-11 NOTE — ED Notes (Signed)
Currently awaiting IV pump channels from portable equipment

## 2022-01-11 NOTE — Consult Note (Addendum)
[facility] Nurse Consult Note: Reason for Consult: Consult requested for right ankle wound.  Performed remotely after review of progress notes and photos in the EMR. Pt is familiar to [facility] team from previous admission on 4/20. Pt is currently being treated with systemic antibiotics for cellulitis.  Wound type: Right outer ankle with generalized erythremia and chronic full thickness wounds in 2 locations to this site, separated by narrow bridge of intact skin.  Dressing procedure/placement/frequency: Topical treatment orders provided for bedside nurses to perform as follows to provide antimicrobial benefits and absorb drainage: Apply Aquacel Kellie Simmering [phone]) to right ankle wound Q day, then cover with 4X4 and kerlex.  Moisten with NS each time to assist with removal. Please re-consult if further assistance is needed.  Thank-you,  Julien Girt MSN, [place], [place], [place], [place]

## 2022-01-11 NOTE — Progress Notes (Addendum)
PROGRESS NOTE    Patient: Danny [name]                            PCP: Celene Squibb, MD                    DOB: 1956/05/09            DOA: 01/10/2022 NGE:[id number]             DOS: 01/11/2022, 8:57 AM   LOS: 1 day   Date of Service: The patient was seen and examined on 01/11/2022  Subjective:   The patient was seen and examined this morning. Blood pressure is improved, on pressors, lactic acid improving Still somnolent, lethargic   Brief Narrative:   ICHAEL PULLARA is a 66 y.o. male with medical history significant of hypertension, ESRD (TTS), A-fib on Eliquis who presents to the emergency department via EMS from a nursing facility due to fever and weakness.   Patient states that he went to dialysis this morning, but no fluid was taken off him. Unable to provide further history, history was obtained from the ED physician and ED medical record.  Per report, patient was found to be hypoxic with O2 sat in the 80s, supplemental oxygen via [place] at 4 LPM was provided.  Patient was reported to be weak and confused today (different from baseline, good sense of humor, AOx3), he was reported to be fine during shift signout at the nursing facility.       ED Course:  Tmax of 102.98F, tachypneic, tachycardic.  Work-up in the ED showed normal CBC except for WBC of 10.6 BMP showed sodium 133, potassium 4.2, chloride 96, bicarb 26, glucose 129, BUN 55, creatinine 4.34, calcium 7.7, albumin 2.0, alkaline phosphatase 187, AST 22, ALT 18, total bilirubin 2.2, eGFR 14, lactic acid 2.0, sed rate 100.  Urinalysis was positive for large leukocytes, > 50 WBC, and many bacteria.  Influenza A, B, SARS coronavirus 2 was negative.  Blood culture pending. Chest x-ray showed possible small pleural effusions.  No overt interstitial edema. He was treated with IV cefepime, Flagyl, vancomycin, Zofran was given, IV hydration was provided.  Tylenol was given due to fever.  Patient's BP continued to be in hypotensive range, he  was started on peripheral IV Levophed.   Hospitalist was asked to admit patient for further evaluation and management.    Assessment & Plan:   Principal Problem:   Septic shock ([place]) Active Problems:   Cellulitis, leg   ESRD on dialysis ([place])   Acute metabolic encephalopathy   Iron deficiency anemia   Atrial fibrillation, chronic (HCC)   Type 2 diabetes mellitus (HCC)   Obesity (BMI 30-39.9)   Mixed hyperlipidemia   Hypotension   Open wound of right foot   Lactic acidosis   Sepsis-septic shock -Possible source of infection right leg cellulitis Blood pressure 113/61, pulse 93, temperature (!) 97 F (36.1 C), temperature source Oral, resp. rate (!) 22, height _0  (1.854 m), weight 108.9 kg, SpO2 97 %. Met sepsis septic shock criteria on admission-with encephalopathy, acute respiratory failure, hypotensive fever, tachycardia, tachypnea, leukocytosis Needing IV fluids, pressors, IV antibiotics (aggressive IV fluid resuscitation due to end-stage renal disease) -We will follow the cultures accordingly -Continue current sepsis protocol IV antibiotics of vancomycin Flagyl and cefepime -As needed Tylenol, -Continue Levophed -Transferring to ICU  Acute metabolic encephalopathy Patient was able to maintain some conversation,  Confused, somnolent, lethargic --- likely due to sepsis, septic shock -Continue treating underlying causes Continue fall precaution and neurochecks Acute respiratory failure with hypoxia Continue supplemental oxygen to maintain O2 sat > 94%   Hypotension -acute on chronic  Worsening hypotension likely due to sepsis -Currently on Levophed -Continue home medication of midodrine    Right foot wound Continue wound care -Standing ultrasound to rule out DVT   Lactic acidosis Lactic acid 2.0, >>3.0 >>2.4   continue to trend lactic acid  ESRD on HD (TTS) Patient states that he had dialysis today but no fluid was taken off him Continue [facility]  consulted for maintenance dialysis   A-fib on Eliquis Continue Eliquis Toprol-XL will be held at this time due to hypotension   Type 2 diabetes mellitus Continue ISS and hypoglycemia protocol   Iron deficiency anemia Continue ferrous sulfate Continue senna to prevent constipation  Mixed hyperlipidemia Continue statin Continue Lovaza   Obesity (BMI 32.66 kgm) Diet and lifestyle modification  Severe debility -Once stable PT OT will be consulted for evaluation and recommendation ----------------------------------------------------------------------------------------------------------------------------------------------- Nutritional status:  The patient's BMI is: Body mass index is 31.66 kg/m. I agree with the assessment and plan as outline  Skin Assessment: I have examined the patient's skin and I agree with the wound assessment as performed by wound care team As outlined belowe:    --------------------------------------------------------------------------------------------------------------------------------------------- Cultures; Blood Cultures x 2 >> NGT Urine Culture  >>> NGT  Sputum Culture >> NGT   -------------------------------------------------------------------------------------------------------------------------------------------  DVT prophylaxis:  SCDs Start: 01/10/22 1933 apixaban (ELIQUIS) tablet 5 mg   Code Status:   Code Status: Full Code  Family Communication: No family member present at bedside- attempt will be made to update daily The above findings and plan of care has been discussed with patient (and family)  in detail,  they expressed understanding and agreement of above. -Advance care planning has been discussed.   Admission status:   Status is: Inpatient Remains inpatient appropriate because: Needing treatment for sepsis, septic shock, IV fluids, IV antibiotics,     Procedures:   No admission procedures for hospital  encounter.   Antimicrobials:  Anti-infectives (From admission, onward)    Start     Dose/Rate Route Frequency Ordered Stop   01/11/22 2200  ceFEPIme (MAXIPIME) 1 g in sodium chloride 0.9 % 100 mL IVPB        1 g 200 mL/hr over 30 Minutes Intravenous Every 24 hours 01/10/22 1815 01/17/22 2159   01/11/22 0715  metroNIDAZOLE (FLAGYL) IVPB 500 mg        500 mg 100 mL/hr over 60 Minutes Intravenous Every 12 hours 01/11/22 0714 01/18/22 0714   01/10/22 1730  ceFEPIme (MAXIPIME) 2 g in sodium chloride 0.9 % 100 mL IVPB        2 g 200 mL/hr over 30 Minutes Intravenous  Once 01/10/22 1719 01/10/22 1804   01/10/22 1730  vancomycin (VANCOREADY) IVPB 2000 mg/400 mL        2,000 mg 200 mL/hr over 120 Minutes Intravenous  Once 01/10/22 1720 01/10/22 2132   01/10/22 1715  aztreonam (AZACTAM) 2 g in sodium chloride 0.9 % 100 mL IVPB  Status:  Discontinued        2 g 200 mL/hr over 30 Minutes Intravenous  Once 01/10/22 1713 01/10/22 1719   01/10/22 1715  metroNIDAZOLE (FLAGYL) IVPB 500 mg        500 mg 100 mL/hr over 60 Minutes Intravenous  Once 01/10/22 1713 01/10/22 1912  01/10/22 1715  vancomycin (VANCOCIN) IVPB 1000 mg/200 mL premix  Status:  Discontinued        1,000 mg 200 mL/hr over 60 Minutes Intravenous  Once 01/10/22 1713 01/10/22 1720        Medication:   apixaban  5 mg Oral BID   ferrous sulfate  325 mg Oral Daily   insulin aspart  0-6 Units Subcutaneous TID WC   [START ON 01/12/2022] midodrine  10 mg Oral Q T,Th,Sa-HD   omega-3 acid ethyl esters  1 g Oral Daily   rosuvastatin  5 mg Oral QPM   senna  2 tablet Oral BID   sevelamer carbonate  800 mg Oral BID WC    acetaminophen **OR** acetaminophen, ondansetron **OR** ondansetron (ZOFRAN) IV   Objective:   Vitals:   01/11/22 0715 01/11/22 0730 01/11/22 0745 01/11/22 0833  BP: 102/66 121/68 104/68 113/61  Pulse: 92 (!) 106 90 93  Resp: 18 (!) 29 19 (!) 22  Temp:      TempSrc:      SpO2: 96% 92% 100% 97%  Weight:       Height:        Intake/Output Summary (Last 24 hours) at 01/11/2022 0857 Last data filed at 01/11/2022 0325 Gross per 24 hour  Intake 2094.73 ml  Output 200 ml  Net 1894.73 ml   Filed Weights   01/10/22 1701  Weight: 108.9 kg     Examination:   Physical Exam  Constitution: Confused, somnolent, lethargic HEENT:        Normocephalic, PERRL, otherwise with in Normal limits  Chest:         Chest symmetric Cardio vascular:  S1/S2, RRR, No murmure, No Rubs or Gallops  pulmonary: Clear to auscultation bilaterally, respirations unlabored, negative wheezes / crackles Abdomen: Soft, non-tender, non-distended, bowel sounds,no masses, no organomegaly Muscular skeletal: Limited exam - in bed, able to move all 4 extremities,   Neuro: Limited exam-confused at this point CNII-XII intact. ,  Moving upper extremities Extremities: +1 edema bilaterally, with erythema edema left greater than right   Skin: Dry, warm to touch, negative for any Rashes, No open wounds Wounds: per nursing documentation                  ------------------------------------------------------------------------------------------------------------------------------------------    LABs:     Latest Ref Rng & Units 01/11/2022    7:49 AM 01/11/2022    3:37 AM 01/10/2022    5:13 PM  CBC  WBC 4.0 - 10.5 K/uL 19.1  20.5  10.6   Hemoglobin 13.0 - 17.0 g/dL 9.5  9.2  9.6   Hematocrit 39.0 - 52.0 % 30.2  29.3  29.7   Platelets 150 - 400 K/uL 280  261  251       Latest Ref Rng & Units 01/11/2022    7:49 AM 01/11/2022    3:37 AM 01/10/2022    5:13 PM  CMP  Glucose 70 - 99 mg/dL 204  314  129   BUN 8 - 23 mg/dL 58  52  55   Creatinine 0.61 - 1.24 mg/dL 4.49  4.26  4.34   Sodium 135 - 145 mmol/L 131  128  133   Potassium 3.5 - 5.1 mmol/L 4.7  4.3  4.2   Chloride 98 - 111 mmol/L 99  95  96   CO2 22 - 32 mmol/L _0 Calcium 8.9 - 10.3 mg/dL 7.5  7.3  7.7   Total  Protein 6.5 - 8.1 g/dL 6.7  6.5  7.4   Total  Bilirubin 0.3 - 1.2 mg/dL 2.5  2.5  2.2   Alkaline Phos 38 - 126 U/L 168  162  187   AST 15 - 41 U/L _0 ALT 0 - 44 U/L _1 Micro Results Recent Results (from the past 240 hour(s))  Resp Panel by RT-PCR (Flu A&B, Covid) Anterior Nasal Swab     Status: None   Collection Time: 01/10/22  5:20 PM   Specimen: Anterior Nasal Swab  Result Value Ref Range Status   SARS Coronavirus 2 by RT PCR NEGATIVE NEGATIVE Final    Comment: (NOTE) SARS-CoV-2 target nucleic acids are NOT DETECTED.  The SARS-CoV-2 RNA is generally detectable in upper respiratory specimens during the acute phase of infection. The lowest concentration of SARS-CoV-2 viral copies this assay can detect is 138 copies/mL. A negative result does not preclude SARS-Cov-2 infection and should not be used as the sole basis for treatment or other patient management decisions. A negative result may occur with  improper specimen collection/handling, submission of specimen other than nasopharyngeal swab, presence of viral mutation(s) within the areas targeted by this assay, and inadequate number of viral copies(<138 copies/mL). A negative result must be combined with clinical observations, patient history, and epidemiological information. The expected result is Negative.  Fact Sheet for Patients:  EntrepreneurPulse.com.au  Fact Sheet for Healthcare Providers:  IncredibleEmployment.be  This test is no t yet approved or cleared by the [place] FDA and  has been authorized for detection and/or diagnosis of SARS-CoV-2 by FDA under an Emergency Use Authorization (EUA). This EUA will remain  in effect (meaning this test can be used) for the duration of the COVID-19 declaration under Section 564(b)(1) of the Act, 21 U.S.C.section 360bbb-3(b)(1), unless the authorization is terminated  or revoked sooner.       Influenza A by PCR NEGATIVE NEGATIVE Final   Influenza B by  PCR NEGATIVE NEGATIVE Final    Comment: (NOTE) The Xpert Xpress SARS-CoV-2/FLU/RSV plus assay is intended as an aid in the diagnosis of influenza from Nasopharyngeal swab specimens and should not be used as a sole basis for treatment. Nasal washings and aspirates are unacceptable for Xpert Xpress SARS-CoV-2/FLU/RSV testing.  Fact Sheet for Patients: EntrepreneurPulse.com.au  Fact Sheet for Healthcare Providers: IncredibleEmployment.be  This test is not yet approved or cleared by the [place] FDA and has been authorized for detection and/or diagnosis of SARS-CoV-2 by FDA under an Emergency Use Authorization (EUA). This EUA will remain in effect (meaning this test can be used) for the duration of the COVID-19 declaration under Section 564(b)(1) of the Act, 21 U.S.C. section 360bbb-3(b)(1), unless the authorization is terminated or revoked.  Performed at [facility], [address], [place], [place] [address]   Blood Culture (routine x 2)     Status: None (Preliminary result)   Collection Time: 01/10/22  5:20 PM   Specimen: BLOOD RIGHT FOREARM  Result Value Ref Range Status   Specimen Description   Final    BLOOD RIGHT FOREARM Performed at [facility], [address], [place], [place] [address]    Special Requests   Final    BOTTLES DRAWN AEROBIC AND ANAEROBIC Blood Culture results may not be optimal due to an inadequate volume of blood received in culture bottles Performed at [facility], [address], [place], [place] [address]  Culture  Setup Time   Final    GRAM POSITIVE COCCI BOTTLES DRAWN AEROBIC AND ANAEROBIC Gram Stain Report Called to,Read Back By and Verified With: FERRAINLO,J_0  BT MATTHEWS,B 7.19.2023 Performed at [facility], [address], [place], [place] [address]    Culture [facility] POSITIVE COCCI  Final   Report Status PENDING  Incomplete  Blood Culture (routine x 2)     Status: None (Preliminary result)   Collection  Time: 01/10/22  5:20 PM   Specimen: BLOOD  Result Value Ref Range Status   Specimen Description   Final    BLOOD RIGHT ANTECUBITAL Performed at [facility], [place] [address] [place], [place] [address]    Special Requests   Final    BOTTLES DRAWN AEROBIC AND ANAEROBIC Blood Culture adequate volume Performed at [facility], [address] [place], [place] [address]    Culture  Setup Time   Final    GRAM POSITIVE COCCI BOTTLES DRAWN AEROBIC AND ANAEROBIC Gram Stain Report Called to,Read Back By and Verified With: FERRAINLO,J_1  BY MAATHEWS,B 7.19.2023 Organism ID to follow Performed at [facility], [place] [address], [place],  [address]    Culture GRAM POSITIVE COCCI  Final   Report Status PENDING  Incomplete    Radiology Reports DG Chest Port 1 View  Result Date: 01/10/2022 CLINICAL DATA:  Questionable sepsis, Pt is a MWF dialysis pt who went today and reportedly did not receive dialysis, or did not have any fluid off (conflicting stories). Pt has been increasingly weak and today staff noticed him to be febrile. EXAM: PORTABLE CHEST - 1 VIEW COMPARISON:  12/16/2021 FINDINGS: Lungs are clear. Heart size and mediastinal contours are within normal limits. Blunting of lateral costophrenic angles suggesting small effusions. Visualized bones unremarkable. IMPRESSION: Possible small pleural effusions.  No overt interstitial edema. Electronically Signed   By: Lucrezia Europe M.D.   On: 01/10/2022 17:33    SIGNED: Deatra James, MD, FHM. > 66 min of critical time was spent seeing evaluating patient, stabilizing patient, discussing plan of care with ICU staff and consultants.  Plan of care and managing hypertension, sepsis, septic shock   [facility],  Pager (please use [email or web address] to page/text) Please use Epic Secure Chat for non-urgent communication (7AM-7PM)  If 7PM-7AM, please contact night-coverage [email or web address], 01/11/2022, 8:57 AM

## 2022-01-11 NOTE — ED Notes (Signed)
CBG results reported to nurse.

## 2022-01-11 NOTE — Progress Notes (Signed)
Patient requested PRN breathing treatment.  Treatment given.  Titrated O2 down to 6L; patient had been 99 to 100% on 7L.  Will notify RN.

## 2022-01-11 NOTE — Consult Note (Signed)
Consultation Note Date: 01/11/2022   Patient Name: Danny [name]  DOB: 29-Aug-1955  MRN: [id number]  Age / Sex: 66 y.o., male  PCP: Celene Squibb, MD Referring Physician: Deatra James, MD  Reason for Consultation: Establishing goals of care  HPI/Patient Profile: 66 y.o. male  with past medical history of ESRD on HD, HTN, afib on Eliquis, HFpEF, anemia of chronic illness, diabetes, foot ulcer (concern for early osteomyelitis on past admission), gout, urinary retention admitted from [facility] facility on 01/10/2022 with sepsis potentially due to right leg cellulitis.   Clinical Assessment and Goals of Care: I met today with Danny [name] after reviewing Epic records including notes from hospitalist/ED/WOC, diagnostics, and vitals. Danny [name] is much more awake, alert, and oriented at time of my visit which seems to be a significant improvement from notes reviewed. Danny [name] shares with me that he is at [facility] but he does not know his diagnosis. I explained our concern for cellulitis and sepsis. He shares with me that he has been at [facility] since August 2022. He is wheelchair bound and used to be able to use slide boards but has required lift to chair for past ~2 months. He is not sure about the progression of his right foot wound but felt from last he was able to see that it may have been worse. He reports good appetite and overall good spirits. He is able to wheel himself around facility. He enjoys telling jokes and shares a few with me.   He shares with me that he was originally resistant to come to the hospital. He shares that he is glad he came but also shares that he was told that if he had waited any longer "I would have met Jesus - that would have been okay with me too." I speak with him about his wishes for resuscitation based on what he just shared. He does elect for DNR status at this time but  wishes to continue treatment otherwise as indicated with hopes of improvement and more time. He confirms that his sister [name] would be his surrogate decision maker and gives me permission to call and speak with her.   Rayshad received call from [facility] as I was leaving. I will plan to reach out to speak with her tomorrow.   All questions/concerns addressed. Emotional support provided.   Primary Decision Maker PATIENT    SUMMARY OF RECOMMENDATIONS   - DNR elected by patient - Continue aggressive care otherwise with hopes of improvement  Code Status/Advance Care Planning: DNR   Symptom Management:  Per attending, renal.   Prognosis:  Overall prognosis concerning given ongoing functional decline over the past 1 year.   Discharge Planning: To Be Determined      Primary Diagnoses: Present on Admission:  Acute metabolic encephalopathy  Cellulitis, leg  Iron deficiency anemia   I have reviewed the medical record, interviewed the patient and family, and examined the patient. The following aspects are pertinent.  Past Medical History:  Diagnosis Date  Anemia    Anemia in ESRD (end-stage renal disease) ([place]) 11/28/2021   Ankle ulcer (HCC)    Arthritis    CKD (chronic kidney disease), stage V (HCC)    Diabetes mellitus without complication (HCC)    Diastolic congestive heart failure (HCC)    Foot ulcer ([place])    Gout    Hypertension    Urinary retention    Social History   Socioeconomic History   Marital status: Divorced    Spouse name: Not on file   Number of children: Not on file   Years of education: Not on file   Highest education level: Not on file  Occupational History   Not on file  Tobacco Use   Smoking status: Former    Packs/day: 1.50    Years: 10.00    Total pack years: 15.00    Types: Cigarettes    Quit date: 06/02/1987    Years since quitting: 34.6    Passive exposure: Past   Smokeless tobacco: Never  Vaping Use   Vaping Use: Never used  Substance  and Sexual Activity   Alcohol use: Not Currently   Drug use: Never   Sexual activity: Not Currently  Other Topics Concern   Not on file  Social History Narrative   Not on file   Social Determinants of Health   Financial Resource Strain: Low Risk  (09/13/2020)   Overall Financial Resource Strain (CARDIA)    Difficulty of Paying Living Expenses: Not very hard  Food Insecurity: No Food Insecurity (09/13/2020)   Hunger Vital Sign    Worried About Running Out of Food in the Last Year: Never true    Ran Out of Food in the Last Year: Never true  Transportation Needs: No Transportation Needs (09/13/2020)   PRAPARE - Hydrologist (Medical): No    Lack of Transportation (Non-Medical): No  Physical Activity: Inactive (09/13/2020)   Exercise Vital Sign    Days of Exercise per Week: 0 days    Minutes of Exercise per Session: 0 min  Stress: No Stress Concern Present (09/13/2020)   [place]    Feeling of Stress : Only a little  Social Connections: Not on file   Family History  Problem Relation Age of Onset   Diabetes Mother    Heart attack Mother    Heart attack Father    Diabetes Brother    Scheduled Meds:  apixaban  5 mg Oral BID   ferrous sulfate  325 mg Oral Daily   insulin aspart  0-6 Units Subcutaneous TID WC   [START ON 01/12/2022] midodrine  10 mg Oral Q T,Th,Sa-HD   omega-3 acid ethyl esters  1 g Oral Daily   rosuvastatin  5 mg Oral QPM   senna  2 tablet Oral BID   sevelamer carbonate  800 mg Oral BID WC   Continuous Infusions:  sodium chloride Stopped (01/10/22 2059)   sodium chloride 250 mL (01/11/22 0039)   ceFEPime (MAXIPIME) IV     lactated ringers 150 mL/hr at 01/11/22 3419   metronidazole     phenylephrine (NEO-SYNEPHRINE) Adult infusion 200 mcg/min (01/11/22 0127)   vasopressin 0.03 Units/min (01/11/22 0130)   PRN Meds:.acetaminophen **OR** acetaminophen, ondansetron  **OR** ondansetron (ZOFRAN) IV Allergies  Allergen Reactions   Dust Mite Extract Itching and Other (See Comments)    Unknown reaction-potential shortness of breath   Prednisone Nausea And Vomiting   Rocephin [Ceftriaxone]  Nausea And Vomiting   Review of Systems  Constitutional:  Positive for activity change. Negative for appetite change.  Gastrointestinal:  Positive for nausea and vomiting.  Musculoskeletal:  Positive for back pain.    Physical Exam Vitals and nursing note reviewed.  Constitutional:      General: He is not in acute distress.    Appearance: He is ill-appearing.  Cardiovascular:     Rate and Rhythm: Normal rate.  Pulmonary:     Effort: No tachypnea, accessory muscle usage or respiratory distress.  Abdominal:     Palpations: Abdomen is soft.  Neurological:     Mental Status: He is alert and oriented to person, place, and time.     Vital Signs: BP 113/61   Pulse 93   Temp (!) 97 F (36.1 C) (Oral)   Resp (!) 22   Ht _0  (1.854 m)   Wt 108.9 kg   SpO2 97%   BMI 31.66 kg/m  Pain Scale: 0-10   Pain Score: 8    SpO2: SpO2: 97 % O2 Device:SpO2: 97 % O2 Flow Rate: .O2 Flow Rate (L/min): 7 L/min  IO: Intake/output summary:  Intake/Output Summary (Last 24 hours) at 01/11/2022 0854 Last data filed at 01/11/2022 0325 Gross per 24 hour  Intake 2094.73 ml  Output 200 ml  Net 1894.73 ml    LBM:   Baseline Weight: Weight: 108.9 kg Most recent weight: Weight: 108.9 kg     Palliative Assessment/Data:     Time In: 1030  Time Total: 75 min  Greater than 50%  of this time was spent counseling and coordinating care related to the above assessment and plan.  Signed by: Vinie Sill, NP Palliative Medicine Team Pager # [phone] (M-F 8a-5p) Team Phone # [phone] (Nights/Weekends)

## 2022-01-11 NOTE — Progress Notes (Signed)
Pt changed over to 7lpm salter HFNC due to spo2 dropping to mid 80's per nurse

## 2022-01-11 NOTE — Consult Note (Signed)
[facility] Renal Consultation Note    Indication for Consultation:  Management of ESRD/hemodialysis; anemia, hypertension/volume and secondary hyperparathyroidism  HPI: Danny [name] is a 66 y.o. male with a PMH significant for HTN, DM, gout, anemia, atrial fibrillation, and ESRD on HD TTS at [facility] who presented to [facility] ED via EMS from SNF due to fever and weakness.  In the ED he was noted to be confused.  SpO2 80% and started on oxygen.  BP 91/55, Temp 100.3, RR 15.  Labs notable for lactate 2, Na 133, alb 2, T bili 2.2, Hgb 9.6, WBC 10.6, INR 1.8, UA + leukocytes and bacteria.  Negative viral panel and blood and urine cultures pending.  He was admitted for sepsis and we were consulted to provide dialysis during his hospitalization.  He did complete his full HD session yesterday.  He also reports several episodes of vomiting over the past few days and reports loose stools as well.  He is currently in ICU on levophed and vasopressin.  Past Medical History:  Diagnosis Date   Anemia    Anemia in ESRD (end-stage renal disease) ([place]) 11/28/2021   Ankle ulcer ([place])    Arthritis    CKD (chronic kidney disease), stage V ([place])    Diabetes mellitus without complication ([place])    Diastolic congestive heart failure ([place])    Foot ulcer ([place])    Gout    Hypertension    Urinary retention    Past Surgical History:  Procedure Laterality Date   ANKLE SURGERY Right    AV FISTULA PLACEMENT Left 04/26/2021   Procedure: LEFT ARM ARTERIOVENOUS (AV) FISTULA CREATION (BRACHIOCEPHALIC);  Surgeon: Rosetta Posner, MD;  Location: [facility];  Service: Vascular;  Laterality: Left;   CHOLECYSTECTOMY     FISTULA SUPERFICIALIZATION Left 07/12/2021   Procedure: LEFT ARM ARTERIOVENOUS FISTULA SUPERFICIALIZATION;  Surgeon: Rosetta Posner, MD;  Location: [facility];  Service: Vascular;  Laterality: Left;   FOOT SURGERY Right    INSERTION OF DIALYSIS CATHETER Right 01/14/2021   Procedure: INSERTION OF TUNNELED  DIALYSIS CATHETER RIGHT INTERNAL JUGULAR;  Surgeon: Virl Cagey, MD;  Location: [facility];  Service: General;  Laterality: Right;   REMOVAL OF A DIALYSIS CATHETER N/A 09/06/2021   Procedure: MINOR REMOVAL OF TUNNELED DIALYSIS CATHETER;  Surgeon: Rosetta Posner, MD;  Location: [facility];  Service: Vascular;  Laterality: N/A;   TRANSURETHRAL RESECTION OF PROSTATE N/A 01/15/2020   Procedure: TRANSURETHRAL RESECTION OF THE PROSTATE (TURP)  with General anesthesia and spinal;  Surgeon: Cleon Gustin, MD;  Location: [facility];  Service: Urology;  Laterality: N/A;   Family History:   Family History  Problem Relation Age of Onset   Diabetes Mother    Heart attack Mother    Heart attack Father    Diabetes Brother    Social History:  reports that he quit smoking about 34 years ago. His smoking use included cigarettes. He has a 15.00 pack-year smoking history. He has been exposed to tobacco smoke. He has never used smokeless tobacco. He reports that he does not currently use alcohol. He reports that he does not use drugs. Allergies  Allergen Reactions   Dust Mite Extract Itching and Other (See Comments)    Unknown reaction-potential shortness of breath   Prednisone Nausea And Vomiting   Rocephin [Ceftriaxone] Nausea And Vomiting   Prior to Admission medications   Medication Sig Start Date End Date Taking? Authorizing Provider  cholecalciferol (VITAMIN D3) 25 MCG (  1000 UNIT) tablet Take 1,000 Units by mouth See admin instructions. Monday's, Wednesday's, Friday's, and Sunday's in the morning,and Tuesday's, Thursday's, and Saturday's at bedtime   Yes [provider]  docusate sodium (COLACE) 100 MG capsule Take 100 mg by mouth 2 (two) times daily.   Yes [provider]  fluticasone (FLONASE) 50 MCG/ACT nasal spray Place 2 sprays into both nostrils daily. 06/01/21  Yes [provider]  folic acid (FOLVITE) 832 MCG tablet Take 1 tablet (400 mcg total) by mouth daily. 08/29/21   Yes Pennington, Rebekah M, PA-C  Insulin NPH Isophane & Regular (HUMULIN 70/30 KWIKPEN Alpha) Inject 5 Units into the skin 2 (two) times daily.   Yes [provider]  Lactulose 20 GM/30ML SOLN Take 30 mLs (20 g total) by mouth daily. 11/11/20  Yes [name], MD  metoprolol succinate (TOPROL XL) 50 MG 24 hr tablet Take 1 tablet (50 mg total) by mouth in the morning and at bedtime. Take with or immediately following a meal. 08/15/21 08/15/22 Yes Barton Dubois, MD  midodrine (PROAMATINE) 10 MG tablet Take 10 mg by mouth Every Tuesday,Thursday,and Saturday with dialysis. 08/06/21  Yes [provider]  Multiple Vitamin (MULTIVITAMIN WITH MINERALS) TABS tablet Take 1 tablet by mouth daily.   Yes [provider]  Omega-3 Fatty Acids (FISH OIL) 500 MG CAPS Take 500 mg by mouth 2 (two) times daily.   Yes [provider]  ondansetron (ZOFRAN) 4 MG tablet Take 4 mg by mouth every 6 (six) hours as needed for nausea or vomiting.   Yes [provider]  OXYCODONE HCL PO Take 5 mg by mouth every 6 (six) hours as needed (SEVERE PAIN).   Yes [provider]  polyethylene glycol (MIRALAX / GLYCOLAX) 17 g packet Take 17 g by mouth daily. 01/28/21  Yes Kathie Dike, MD  rosuvastatin (CRESTOR) 5 MG tablet Take 5 mg by mouth every evening.   Yes [provider]  senna (SENOKOT) 8.6 MG tablet Take 2 tablets by mouth 2 (two) times daily.   Yes [provider]  sevelamer carbonate (RENVELA) 800 MG tablet Take 1 tablet (800 mg total) by mouth 2 (two) times daily with a meal. 01/27/21  Yes Kathie Dike, MD  tamsulosin (FLOMAX) 0.4 MG CAPS capsule Take 1 capsule (0.4 mg total) by mouth daily after supper. 09/09/21  Yes [name], [name], MD  vitamin C (ASCORBIC ACID) 500 MG tablet Take 1,000 mg by mouth 2 (two) times daily.   Yes [provider]  vitamin E 1000 UNIT capsule Take 1,000 Units by mouth See admin instructions. Monday's,  Wednesday's, Friday's, and Sunday's in the morning, and Tuesday's, Thursday's, and Saturday's at bedtime.   Yes [provider]  zinc sulfate 220 (50 Zn) MG capsule Take 220 mg by mouth daily.   Yes [provider]  acetaminophen (TYLENOL) 500 MG tablet Take 1,000 mg by mouth every 6 (six) hours as needed for moderate pain or headache.    [provider]  apixaban (ELIQUIS) 5 MG TABS tablet Take 1 tablet (5 mg total) by mouth 2 (two) times daily. 01/27/21   Kathie Dike, MD  cephALEXin (KEFLEX) 500 MG capsule Take 1 capsule (500 mg total) by mouth 4 (four) times daily. Patient not taking: Reported on 01/10/2022 12/16/21   Roemhildt, Lorin T, PA-C  Cyanocobalamin (B-12 COMPLIANCE INJECTION) 1000 MCG/ML KIT Inject 1,000 mcg as directed every 28 (twenty-eight) days. Patient not taking: Reported on 01/10/2022    [provider]  ferrous sulfate 325 (65 FE) MG tablet Take 325 mg by mouth 2 (two) times daily.    [provider]   Current Facility-Administered Medications  Medication Dose Route Frequency Provider Last Rate Last Admin   0.9 %  sodium chloride infusion  250 mL Intravenous Continuous Varney Biles, MD   Held at 01/10/22 2059   0.9 %  sodium chloride infusion  250 mL Intravenous Continuous Adefeso, Oladapo, DO   Stopped at 01/11/22 1001   acetaminophen (TYLENOL) tablet 650 mg  650 mg Oral Q6H PRN Adefeso, Oladapo, DO       Or   acetaminophen (TYLENOL) suppository 650 mg  650 mg Rectal Q6H PRN Adefeso, Oladapo, DO       apixaban (ELIQUIS) tablet 5 mg  5 mg Oral BID Adefeso, Oladapo, DO   5 mg at 01/11/22 1044   ceFEPIme (MAXIPIME) 1 g in sodium chloride 0.9 % 100 mL IVPB  1 g Intravenous Q24H Adefeso, Oladapo, DO       ferrous sulfate tablet 325 mg  325 mg Oral Daily Adefeso, Oladapo, DO   325 mg at 01/11/22 1044   insulin aspart (novoLOG) injection 0-6 Units  0-6 Units Subcutaneous TID WC Adefeso, Oladapo, DO   1 Units at 01/11/22 0749    lactated ringers infusion   Intravenous Continuous Adefeso, Oladapo, DO 150 mL/hr at 01/11/22 0925 New Bag at 01/11/22 0925   metroNIDAZOLE (FLAGYL) IVPB 500 mg  500 mg Intravenous Q12H Shahmehdi, Seyed A, MD 100 mL/hr at 01/11/22 0919 500 mg at 01/11/22 0919   [START ON 01/12/2022] midodrine (PROAMATINE) tablet 10 mg  10 mg Oral Q T,Th,Sa-HD Adefeso, Oladapo, DO       omega-3 acid ethyl esters (LOVAZA) capsule 1 g  1 g Oral Daily Adefeso, Oladapo, DO   1 g at 01/11/22 1044   ondansetron (ZOFRAN) tablet 4 mg  4 mg Oral Q6H PRN Adefeso, Oladapo, DO       Or   ondansetron (ZOFRAN) injection 4 mg  4 mg Intravenous Q6H PRN Adefeso, Oladapo, DO       phenylephrine (NEO-SYNEPHRINE) 9m/NS 2585mpremix infusion  25-200 mcg/min Intravenous Titrated Shahmehdi, Seyed A, MD 37.5 mL/hr at 01/11/22 1139 50 mcg/min at 01/11/22 1139   rosuvastatin (CRESTOR) tablet 5 mg  5 mg Oral QPM Adefeso, Oladapo, DO   5 mg at 01/10/22 2233   senna (SENOKOT) tablet 17.2 mg  2 tablet Oral BID Adefeso, Oladapo, DO   17.2 mg at 01/10/22 2233   sevelamer carbonate (RENVELA) tablet 800 mg  800 mg Oral BID WC Adefeso, Oladapo, DO       vasopressin (PITRESSIN) 20 Units in sodium chloride 0.9 % 100 mL infusion-*FOR SHOCK*  0-0.03 Units/min Intravenous Continuous NaKathrynn HumbleAnkit, MD 9 mL/hr at 01/11/22 1136 0.03 Units/min at 01/11/22 1136   Labs: Basic Metabolic Panel: Recent Labs  Lab 01/10/22 1713 01/11/22 0337 01/11/22 0749  NA 133* 128* 131*  K 4.2 4.3 4.7  CL 96* 95* 99  CO2 26 21* 20*  GLUCOSE 129* 314* 204*  BUN 55* 52* 58*  CREATININE 4.34* 4.26* 4.49*  CALCIUM 7.7* 7.3* 7.5*  PHOS  --  4.5  --    Liver Function Tests: Recent Labs  Lab 01/10/22 1713 01/11/22 0337 01/11/22 0749  AST 21 21 21   ALT 18 18 19   ALKPHOS 187* 162* 168*  BILITOT 2.2* 2.5* 2.5*  PROT 7.4 6.5 6.7  ALBUMIN 2.0* 1.8* 1.8*   No results  for input(s): "LIPASE", "AMYLASE" in the last 168 hours. No results for input(s): "AMMONIA" in  the last 168 hours. CBC: Recent Labs  Lab 01/10/22 1713 01/11/22 0337 01/11/22 0749  WBC 10.6* 20.5* 19.1*  NEUTROABS 10.1*  --  16.6*  HGB 9.6* 9.2* 9.5*  HCT 29.7* 29.3* 30.2*  MCV 84.1 84.9 85.1  PLT 251 261 280   Cardiac Enzymes: No results for input(s): "CKTOTAL", "CKMB", "CKMBINDEX", "TROPONINI" in the last 168 hours. CBG: Recent Labs  Lab 01/11/22 0743  GLUCAP 188*   Iron Studies: No results for input(s): "IRON", "TIBC", "TRANSFERRIN", "FERRITIN" in the last 72 hours. Studies/Results: US Venous Img Lower Bilateral (DVT)  Result Date: 01/11/2022 CLINICAL DATA:  Bilateral lower extremity edema and pain. EXAM: BILATERAL LOWER EXTREMITY VENOUS DOPPLER ULTRASOUND TECHNIQUE: Gray-scale sonography with graded compression, as well as color Doppler and duplex ultrasound were performed to evaluate the lower extremity deep venous systems from the level of the common femoral vein and including the common femoral, femoral, profunda femoral, popliteal and calf veins including the posterior tibial, peroneal and gastrocnemius veins when visible. The superficial great saphenous vein was also interrogated. Spectral Doppler was utilized to evaluate flow at rest and with distal augmentation maneuvers in the common femoral, femoral and popliteal veins. COMPARISON:  None Available. FINDINGS: RIGHT LOWER EXTREMITY Common Femoral Vein: No evidence of thrombus. Normal compressibility, respiratory phasicity and response to augmentation. Saphenofemoral Junction: No evidence of thrombus. Normal compressibility and flow on color Doppler imaging. Profunda Femoral Vein: No evidence of thrombus. Normal compressibility and flow on color Doppler imaging. Femoral Vein: No evidence of thrombus. Normal compressibility, respiratory phasicity and response to augmentation. Popliteal Vein: No evidence of thrombus. Normal compressibility, respiratory phasicity and response to augmentation. Calf Veins: No evidence of  thrombus. Normal compressibility and flow on color Doppler imaging. LEFT LOWER EXTREMITY Common Femoral Vein: No evidence of thrombus. Normal compressibility, respiratory phasicity and response to augmentation. Saphenofemoral Junction: No evidence of thrombus. Normal compressibility and flow on color Doppler imaging. Profunda Femoral Vein: No evidence of thrombus. Normal compressibility and flow on color Doppler imaging. Femoral Vein: No evidence of thrombus. Normal compressibility, respiratory phasicity and response to augmentation. Popliteal Vein: No evidence of thrombus. Normal compressibility, respiratory phasicity and response to augmentation. Calf Veins: No evidence of thrombus. Normal compressibility and flow on color Doppler imaging. IMPRESSION: No evidence of deep venous thrombosis in either lower extremity. Electronically Signed   By: Margaretha Sheffield M.D.   On: 01/11/2022 09:17   DG Chest Port 1 View  Result Date: 01/10/2022 CLINICAL DATA:  Questionable sepsis, Pt is a MWF dialysis pt who went today and reportedly did not receive dialysis, or did not have any fluid off (conflicting stories). Pt has been increasingly weak and today staff noticed him to be febrile. EXAM: PORTABLE CHEST - 1 VIEW COMPARISON:  12/16/2021 FINDINGS: Lungs are clear. Heart size and mediastinal contours are within normal limits. Blunting of lateral costophrenic angles suggesting small effusions. Visualized bones unremarkable. IMPRESSION: Possible small pleural effusions.  No overt interstitial edema. Electronically Signed   By: Lucrezia Europe M.D.   On: 01/10/2022 17:33    ROS: Pertinent items are noted in HPI. Physical Exam: Vitals:   01/11/22 0745 01/11/22 0833 01/11/22 0930 01/11/22 0945  BP: 104/68 113/61 118/60 115/62  Pulse: 90 93 90 92  Resp: 19 (!) 22 20 (!) 24  Temp:    97.8 F (36.6 C)  TempSrc:    Axillary  SpO2: 100% 97% 100%  100%  Weight:      Height:          Weight change:   Intake/Output  Summary (Last 24 hours) at 01/11/2022 1140 Last data filed at 01/11/2022 1000 Gross per 24 hour  Intake 2169.06 ml  Output 200 ml  Net 1969.06 ml   BP 115/62   Pulse 92   Temp 97.8 F (36.6 C) (Axillary)   Resp (!) 24   Ht 6' 1"  (1.854 m)   Wt 108.9 kg   SpO2 100%   BMI 31.66 kg/m  General appearance: alert, cooperative, fatigued, and no distress Head: Normocephalic, without obvious abnormality, atraumatic Resp: clear to auscultation bilaterally Cardio: regular rate and rhythm, S1, S2 normal, no murmur, click, rub or gallop GI: soft, non-tender; bowel sounds normal; no masses,  no organomegaly Extremities: edema 1+ bilateral lower extremity edema L>R, bandage on right ankle with erythema and LUE AVF +T/B Dialysis Access: LUE AVF  Dialysis Orders: Center: [facility]  on TTS . EDW 110 kg HD Bath 2K/2.5Ca  Time 4 hours Heparin none. Access LUE AVF BFR 400 DFR 500    Venofer  50 mg IV once a week   Assessment/Plan:  Sepsis - possible source is right ankle/leg cellulitis or possible UTI.  Currently on broad spectrum antibiotics as well as levophed and vasopressin.  Will change midodrine to daily to help with hypotension.  ESRD -  Plan for HD tomorrow if his BP will tolerate it, otherwise will need to transfer to [facility] for CVV[facility].  Hypertension/volume  - as above.  Anemia  - Will add ESA and follow  Metabolic bone disease -  continue with home meds  Nutrition -  renal diet, carb modified.   Acute metabolic encephalopathy - already improving and likely due to hypotension/shock.  Atrial fib - on Eliquis  DM type 2 - per primary svc  [name], MD [place] 01/11/2022, 11:40 AM

## 2022-01-11 NOTE — Sepsis Progress Note (Signed)
Elink tracking the code Sepsis.

## 2022-01-11 NOTE — Hospital Course (Addendum)
Danny [name] is a 66 y.o. male with medical history significant of hypertension, ESRD (TTS), A-fib on Eliquis who presents to the emergency department via EMS from a nursing facility due to fever and weakness.   Patient states that he went to dialysis this morning, but no fluid was taken off him. Unable to provide further history, history was obtained from the ED physician and ED medical record.  Per report, patient was found to be hypoxic with O2 sat in the 80s, supplemental oxygen via [place] at 4 LPM was provided.  Patient was reported to be weak and confused today (different from baseline, good sense of humor, AOx3), he was reported to be fine during shift signout at the nursing facility.       ED Course:  Tmax of 102.32F, tachypneic, tachycardic.  Work-up in the ED showed normal CBC except for WBC of 10.6 BMP showed sodium 133, potassium 4.2, chloride 96, bicarb 26, glucose 129, BUN 55, creatinine 4.34, calcium 7.7, albumin 2.0, alkaline phosphatase 187, AST 22, ALT 18, total bilirubin 2.2, eGFR 14, lactic acid 2.0, sed rate 100.  Urinalysis was positive for large leukocytes, > 50 WBC, and many bacteria.  Influenza A, B, SARS coronavirus 2 was negative.  Blood culture pending. Chest x-ray showed possible small pleural effusions.  No overt interstitial edema. He was treated with IV cefepime, Flagyl, vancomycin, Zofran was given, IV hydration was provided.  Tylenol was given due to fever.  Patient's BP continued to be in hypotensive range, he was started on peripheral IV Levophed.   Hospitalist was asked to admit patient for further evaluation and management.

## 2022-01-11 NOTE — Sepsis Progress Note (Signed)
Notified bedside nurse of need to draw repeat lactic acid. 

## 2022-01-11 NOTE — ED Notes (Signed)
Pt is currently on 7 L high flow [place], SpO2 95-99% when awake; However, when pt goes to sleep SpO2 drops to 84%--it goes up when he wakes up

## 2022-01-12 DIAGNOSIS — Z66 Do not resuscitate: Secondary | ICD-10-CM | POA: Diagnosis not present

## 2022-01-12 DIAGNOSIS — Z7189 Other specified counseling: Secondary | ICD-10-CM | POA: Diagnosis not present

## 2022-01-12 DIAGNOSIS — A419 Sepsis, unspecified organism: Secondary | ICD-10-CM | POA: Diagnosis not present

## 2022-01-12 DIAGNOSIS — R6521 Severe sepsis with septic shock: Secondary | ICD-10-CM | POA: Diagnosis not present

## 2022-01-12 DIAGNOSIS — Z515 Encounter for palliative care: Secondary | ICD-10-CM | POA: Diagnosis not present

## 2022-01-12 LAB — COMPREHENSIVE METABOLIC PANEL
ALT: 19 U/L (ref 0–44)
AST: 26 U/L (ref 15–41)
Albumin: 1.6 g/dL — ABNORMAL LOW (ref 3.5–5.0)
Alkaline Phosphatase: 136 U/L — ABNORMAL HIGH (ref 38–126)
Anion gap: 10 (ref 5–15)
BUN: 70 mg/dL — ABNORMAL HIGH (ref 8–23)
CO2: 22 mmol/L (ref 22–32)
Calcium: 7.5 mg/dL — ABNORMAL LOW (ref 8.9–10.3)
Chloride: 99 mmol/L (ref 98–111)
Creatinine, Ser: 5.07 mg/dL — ABNORMAL HIGH (ref 0.61–1.24)
GFR, Estimated: 12 mL/min — ABNORMAL LOW (ref >60)
Glucose, Bld: 163 mg/dL — ABNORMAL HIGH (ref 70–99)
Potassium: 4.7 mmol/L (ref 3.5–5.1)
Sodium: 131 mmol/L — ABNORMAL LOW (ref 135–145)
Total Bilirubin: 1.7 mg/dL — ABNORMAL HIGH (ref 0.3–1.2)
Total Protein: 5.9 g/dL — ABNORMAL LOW (ref 6.5–8.1)

## 2022-01-12 LAB — GLUCOSE, CAPILLARY
Glucose-Capillary: 132 mg/dL — ABNORMAL HIGH (ref 70–99)
Glucose-Capillary: 151 mg/dL — ABNORMAL HIGH (ref 70–99)
Glucose-Capillary: 149 mg/dL — ABNORMAL HIGH (ref 70–99)
Glucose-Capillary: 175 mg/dL — ABNORMAL HIGH (ref 70–99)

## 2022-01-12 LAB — CBC
HCT: 23.8 % — ABNORMAL LOW (ref 39.0–52.0)
Hemoglobin: 7.8 g/dL — ABNORMAL LOW (ref 13.0–17.0)
MCH: 27.2 pg (ref 26.0–34.0)
MCHC: 32.8 g/dL (ref 30.0–36.0)
MCV: 82.9 fL (ref 80.0–100.0)
Platelets: 243 10*3/uL (ref 150–400)
RBC: 2.87 MIL/uL — ABNORMAL LOW (ref 4.22–5.81)
RDW: 19.8 % — ABNORMAL HIGH (ref 11.5–15.5)
WBC: 11.9 10*3/uL — ABNORMAL HIGH (ref 4.0–10.5)
nRBC: 0 % (ref 0.0–0.2)

## 2022-01-12 LAB — HEPATITIS B SURFACE ANTIBODY, QUANTITATIVE: Hep B S AB Quant (Post): 6.2 m[IU]/mL — ABNORMAL LOW (ref >9.9)

## 2022-01-12 MED ORDER — DEXTROSE 5 % IV SOLN
4.0000 10*6.[IU] | Freq: Once | INTRAVENOUS | Status: AC
  Administered 2022-01-12: 4 10*6.[IU] via INTRAVENOUS
  Filled 2022-01-12: qty 4

## 2022-01-12 MED ORDER — METOPROLOL SUCCINATE ER 25 MG PO TB24
25.0000 mg | ORAL_TABLET | Freq: Every day | ORAL | Status: DC
Start: 2022-01-12 — End: 2022-01-13
  Administered 2022-01-12: 25 mg via ORAL
  Filled 2022-01-12: qty 1

## 2022-01-12 MED ORDER — METOPROLOL TARTRATE 5 MG/5ML IV SOLN
5.0000 mg | INTRAVENOUS | Status: DC | PRN
Start: 2022-01-12 — End: 2022-01-20
  Administered 2022-01-12: 5 mg via INTRAVENOUS
  Filled 2022-01-12: qty 5

## 2022-01-12 MED ORDER — PENICILLIN G POTASSIUM 5000000 UNITS IJ SOLR
2.0000 10*6.[IU] | INTRAVENOUS | Status: DC
  Administered 2022-01-12 – 2022-01-16 (×24): 2 10*6.[IU] via INTRAVENOUS
  Filled 2022-01-12 (×30): qty 2

## 2022-01-12 MED ORDER — RISAQUAD PO CAPS
1.0000 | ORAL_CAPSULE | Freq: Three times a day (TID) | ORAL | Status: DC
  Administered 2022-01-12 – 2022-01-16 (×12): 1 via ORAL
  Filled 2022-01-12 (×12): qty 1

## 2022-01-12 MED ORDER — CIPROFLOXACIN HCL 250 MG PO TABS
500.0000 mg | ORAL_TABLET | ORAL | Status: DC
Start: 2022-01-12 — End: 2022-01-13
  Administered 2022-01-12 – 2022-01-13 (×2): 500 mg via ORAL
  Filled 2022-01-12 (×2): qty 2

## 2022-01-12 MED ORDER — ALBUMIN HUMAN 25 % IV SOLN
12.5000 g | INTRAVENOUS | Status: AC
  Filled 2022-01-12: qty 50

## 2022-01-12 MED ORDER — DILTIAZEM HCL 25 MG/5ML IV SOLN
10.0000 mg | Freq: Once | INTRAVENOUS | Status: AC
  Administered 2022-01-12: 10 mg via INTRAVENOUS
  Filled 2022-01-12: qty 5

## 2022-01-12 MED ORDER — PANTOPRAZOLE SODIUM 40 MG PO TBEC
40.0000 mg | DELAYED_RELEASE_TABLET | Freq: Every day | ORAL | Status: DC
  Administered 2022-01-12 – 2022-01-18 (×7): 40 mg via ORAL
  Filled 2022-01-12 (×7): qty 1

## 2022-01-12 MED ORDER — DARBEPOETIN ALFA 100 MCG/0.5ML IJ SOSY
100.0000 ug | PREFILLED_SYRINGE | INTRAMUSCULAR | Status: DC
  Filled 2022-01-12 (×2): qty 0.5

## 2022-01-12 MED ORDER — DARBEPOETIN ALFA 100 MCG/0.5ML IJ SOSY
PREFILLED_SYRINGE | INTRAMUSCULAR | Status: AC
  Administered 2022-01-12: 100 ug via SUBCUTANEOUS
  Filled 2022-01-12: qty 0.5

## 2022-01-12 MED ORDER — PENTAFLUOROPROP-TETRAFLUOROETH EX AERO: 1.0000 | INHALATION_SPRAY | CUTANEOUS | Status: DC | PRN

## 2022-01-12 NOTE — Progress Notes (Signed)
PROGRESS NOTE    Patient: Danny [name]                            PCP: Celene Squibb, MD                    DOB: September 21, 1955            DOA: 01/10/2022 VQX:[id number]             DOS: 01/12/2022, 11:18 AM   LOS: 2 days   Date of Service: The patient was seen and examined on 01/12/2022  Subjective:   The patient was seen and examined this morning, much more awake alert oriented, following commands Complain of shortness of breath, on 6 L of oxygen, satting 100%. Per patient he is not O2 dependent at baseline.   Brief Narrative:   Danny [name] is a 66 y.o. male with medical history significant of hypertension, ESRD (TTS), A-fib on Eliquis who presents to the emergency department via EMS from a nursing facility due to fever and weakness.   Patient states that he went to dialysis this morning, but no fluid was taken off him. Unable to provide further history, history was obtained from the ED physician and ED medical record.  Per report, patient was found to be hypoxic with O2 sat in the 80s, supplemental oxygen via [place] at 4 LPM was provided.  Patient was reported to be weak and confused today (different from baseline, good sense of humor, AOx3), he was reported to be fine during shift signout at the nursing facility.       ED Course:  Tmax of 102.41F, tachypneic, tachycardic.  Work-up in the ED showed normal CBC except for WBC of 10.6 BMP showed sodium 133, potassium 4.2, chloride 96, bicarb 26, glucose 129, BUN 55, creatinine 4.34, calcium 7.7, albumin 2.0, alkaline phosphatase 187, AST 22, ALT 18, total bilirubin 2.2, eGFR 14, lactic acid 2.0, sed rate 100.  Urinalysis was positive for large leukocytes, > 50 WBC, and many bacteria.  Influenza A, B, SARS coronavirus 2 was negative.  Blood culture pending. Chest x-ray showed possible small pleural effusions.  No overt interstitial edema. He was treated with IV cefepime, Flagyl, vancomycin, Zofran was given, IV hydration was provided.  Tylenol was  given due to fever.  Patient's BP continued to be in hypotensive range, he was started on peripheral IV Levophed.   Hospitalist was asked to admit patient for further evaluation and management.    Assessment & Plan:   Principal Problem:   Septic shock ([place]) Active Problems:   Cellulitis, leg   ESRD on dialysis ([place])   Acute metabolic encephalopathy   Iron deficiency anemia   Atrial fibrillation, chronic (HCC)   Type 2 diabetes mellitus (HCC)   Obesity (BMI 30-39.9)   Mixed hyperlipidemia   Hypotension   Open wound of right foot   Lactic acidosis   Sepsis-septic shock Bacteremia: Streptococcus, UTI E. coli -Possible source of infection right leg cellulitis  -Sepsis/septic shock physiology improving -Off Levophed, status post IV fluid resuscitation, IV antibiotic  -Blood cultures are growing group B strep, urine culture growing E. Coli Per infectious disease pharmacy recommendation antibiotics was switched to pen G -Initiating p.o. ciprofloxacin  -Discontinuing broad-spectrum antibiotics of vancomycin, Flagyl, cefepime today 01/12/2022  Met sepsis septic shock criteria on admission-with encephalopathy, acute respiratory failure, hypotensive fever, tachycardia, tachypnea, leukocytosis  -As needed Tylenol,  -Transferring to  ICU  Acute metabolic encephalopathy -Resolved -- likely it was due to sepsis, septic shock -Continue treating underlying causes    Hypotension -acute on chronic  Worsening hypotension likely due to sepsis -Successfully weaned off Levophed -Continue home medication of midodrine    Right foot wound Continue wound care -[place] lower extremity Doppler>>> No evidence of deep venous thrombosis in either lower extremity.   Lactic acidosis Lactic acid 2.0, >>3.0 >>2.4   continue to trend lactic acid  ESRD on HD (TTS) Patient states that he had dialysis today but no fluid was taken off him Continue [facility] consulted for maintenance  dialysis -Planning for hemodialysis today 01/12/2022   A-fib on Eliquis Continue Eliquis Toprol-XL will be held at this time due to hypotension   Type 2 diabetes mellitus Continue ISS and hypoglycemia protocol   Iron deficiency anemia Continue ferrous sulfate Continue senna to prevent constipation  Mixed hyperlipidemia Continue statin Continue Lovaza   Obesity (BMI 32.66 kgm) Diet and lifestyle modification  Severe debility -Once stable PT OT will be consulted for evaluation and recommendation ----------------------------------------------------------------------------------------------------------------------------------------------- Nutritional status:  The patient's BMI is: Body mass index is 32.31 kg/m. I agree with the assessment and plan as outline  Skin Assessment: I have examined the patient's skin and I agree with the wound assessment as performed by wound care team As outlined belowe:    --------------------------------------------------------------------------------------------------------------------------------------------- Cultures; Blood Cultures x 2 >> group B strep Urine Culture  >>> E. coli Sputum Culture >> does not been obtained  -------------------------------------------------------------------------------------------------------------------------------------------  DVT prophylaxis:  SCDs Start: 01/10/22 1933 apixaban (ELIQUIS) tablet 5 mg   Code Status:   Code Status: DNR  Family Communication: No family member present at bedside- attempt will be made to update daily The above findings and plan of care has been discussed with patient (and family)  in detail,  they expressed understanding and agreement of above. -Advance care planning has been discussed.   Admission status:   Status is: Inpatient Remains inpatient appropriate because: Needing treatment for sepsis, septic shock, IV fluids, IV antibiotics,     Procedures:   No  admission procedures for hospital encounter.   Antimicrobials:  Anti-infectives (From admission, onward)    Start     Dose/Rate Route Frequency Ordered Stop   01/12/22 1600  vancomycin (VANCOCIN) IVPB 1000 mg/200 mL premix  Status:  Discontinued        1,000 mg 200 mL/hr over 60 Minutes Intravenous Every T-Th-Sa (Hemodialysis) 01/11/22 1300 01/12/22 1012   01/12/22 1600  penicillin G potassium 2 Million Units in dextrose 5 % 50 mL IVPB        2 Million Units 100 mL/hr over 30 Minutes Intravenous Every 4 hours 01/12/22 1013     01/12/22 1215  ciprofloxacin (CIPRO) tablet 500 mg       Note to Pharmacy: UTI - E-coli   500 mg Oral 2 times daily 01/12/22 1118     01/12/22 1200  penicillin G potassium 4 Million Units in dextrose 5 % 250 mL IVPB        4 Million Units 250 mL/hr over 60 Minutes Intravenous  Once 01/12/22 1012     01/11/22 2200  ceFEPIme (MAXIPIME) 1 g in sodium chloride 0.9 % 100 mL IVPB  Status:  Discontinued        1 g 200 mL/hr over 30 Minutes Intravenous Every 24 hours 01/10/22 1815 01/12/22 1012   01/11/22 0715  metroNIDAZOLE (FLAGYL) IVPB 500 mg  Status:  Discontinued  500 mg 100 mL/hr over 60 Minutes Intravenous Every 12 hours 01/11/22 0714 01/12/22 1012   01/10/22 1730  ceFEPIme (MAXIPIME) 2 g in sodium chloride 0.9 % 100 mL IVPB        2 g 200 mL/hr over 30 Minutes Intravenous  Once 01/10/22 1719 01/10/22 1804   01/10/22 1730  vancomycin (VANCOREADY) IVPB 2000 mg/400 mL        2,000 mg 200 mL/hr over 120 Minutes Intravenous  Once 01/10/22 1720 01/10/22 2132   01/10/22 1715  aztreonam (AZACTAM) 2 g in sodium chloride 0.9 % 100 mL IVPB  Status:  Discontinued        2 g 200 mL/hr over 30 Minutes Intravenous  Once 01/10/22 1713 01/10/22 1719   01/10/22 1715  metroNIDAZOLE (FLAGYL) IVPB 500 mg        500 mg 100 mL/hr over 60 Minutes Intravenous  Once 01/10/22 1713 01/10/22 1912   01/10/22 1715  vancomycin (VANCOCIN) IVPB 1000 mg/200 mL premix  Status:   Discontinued        1,000 mg 200 mL/hr over 60 Minutes Intravenous  Once 01/10/22 1713 01/10/22 1720        Medication:   apixaban  5 mg Oral BID   Chlorhexidine Gluconate Cloth  6 each Topical Q0600   ciprofloxacin  500 mg Oral BID   darbepoetin (ARANESP) injection - NON-DIALYSIS  100 mcg Subcutaneous Q Thu-1800   ferrous sulfate  325 mg Oral Daily   insulin aspart  0-6 Units Subcutaneous TID WC   lactobacillus  1 g Oral TID WC   midodrine  10 mg Oral BID   mupirocin ointment   Nasal BID   omega-3 acid ethyl esters  1 g Oral Daily   rosuvastatin  5 mg Oral QPM   senna  2 tablet Oral BID   sevelamer carbonate  800 mg Oral BID WC    acetaminophen **OR** acetaminophen, levalbuterol, ondansetron **OR** ondansetron (ZOFRAN) IV, pentafluoroprop-tetrafluoroeth, sodium chloride   Objective:   Vitals:   01/12/22 1015 01/12/22 1030 01/12/22 1045 01/12/22 1100  BP: (!) 116/50 (!) 124/50 (!) 127/53 (!) 134/54  Pulse: 87 85 89 95  Resp: (!) 24 15 (!) 28 (!) 27  Temp:      TempSrc:      SpO2: 100% 100% 100% 100%  Weight:      Height:       No intake or output data in the 24 hours ending 01/12/22 1118  Filed Weights   01/12/22 0500 01/12/22 0904 01/12/22 0907  Weight: 111.1 kg 111.1 kg 111.1 kg     Examination:      Physical Exam:   General:  AAO x 3,  cooperative, no distress;   HEENT:  Normocephalic, PERRL, otherwise with in Normal limits   Neuro:  CNII-XII intact. , normal motor and sensation, reflexes intact   Lungs:   Clear to auscultation BL, Respirations unlabored,  No wheezes / crackles  Cardio:    S1/S2, RRR, No murmure, No Rubs or Gallops   Abdomen:  Soft, non-tender, bowel sounds active all four quadrants, no guarding or peritoneal signs.  Muscular  skeletal:  Limited exam -does not ambulate at baseline, global generalized weaknesses - in bed, able to move all 4 extremities,   2+ pulses,  symmetric, No pitting edema  Skin:  Dry, warm to touch, negative  for any Rashes, erythema edema right lower extremity greater than left  Wounds: Please see nursing documentation-extremity wounds erythema, open wound l  right ateral ankle                        ------------------------------------------------------------------------------------------------------------------------------------------    LABs:     Latest Ref Rng & Units 01/11/2022    7:49 AM 01/11/2022    3:37 AM 01/10/2022    5:13 PM  CBC  WBC 4.0 - 10.5 K/uL 19.1  20.5  10.6   Hemoglobin 13.0 - 17.0 g/dL 9.5  9.2  9.6   Hematocrit 39.0 - 52.0 % 30.2  29.3  29.7   Platelets 150 - 400 K/uL 280  261  251       Latest Ref Rng & Units 01/12/2022    9:40 AM 01/11/2022    7:49 AM 01/11/2022    3:37 AM  CMP  Glucose 70 - 99 mg/dL 163  204  314   BUN 8 - 23 mg/dL 70  58  52   Creatinine 0.61 - 1.24 mg/dL 5.07  4.49  4.26   Sodium 135 - 145 mmol/L 131  131  128   Potassium 3.5 - 5.1 mmol/L 4.7  4.7  4.3   Chloride 98 - 111 mmol/L 99  99  95   CO2 22 - 32 mmol/L 22  20  21    Calcium 8.9 - 10.3 mg/dL 7.5  7.5  7.3   Total Protein 6.5 - 8.1 g/dL 5.9  6.7  6.5   Total Bilirubin 0.3 - 1.2 mg/dL 1.7  2.5  2.5   Alkaline Phos 38 - 126 U/L 136  168  162   AST 15 - 41 U/L 26  21  21    ALT 0 - 44 U/L 19  19  18         Micro Results Recent Results (from the past 240 hour(s))  Resp Panel by RT-PCR (Flu A&B, Covid) Anterior Nasal Swab     Status: None   Collection Time: 01/10/22  5:20 PM   Specimen: Anterior Nasal Swab  Result Value Ref Range Status   SARS Coronavirus 2 by RT PCR NEGATIVE NEGATIVE Final    Comment: (NOTE) SARS-CoV-2 target nucleic acids are NOT DETECTED.  The SARS-CoV-2 RNA is generally detectable in upper respiratory specimens during the acute phase of infection. The lowest concentration of SARS-CoV-2 viral copies this assay can detect is 138 copies/mL. A negative result does not preclude SARS-Cov-2 infection and should not be used as the sole basis for treatment  or other patient management decisions. A negative result may occur with  improper specimen collection/handling, submission of specimen other than nasopharyngeal swab, presence of viral mutation(s) within the areas targeted by this assay, and inadequate number of viral copies(<138 copies/mL). A negative result must be combined with clinical observations, patient history, and epidemiological information. The expected result is Negative.  Fact Sheet for Patients:  EntrepreneurPulse.com.au  Fact Sheet for Healthcare Providers:  IncredibleEmployment.be  This test is no t yet approved or cleared by the [place] FDA and  has been authorized for detection and/or diagnosis of SARS-CoV-2 by FDA under an Emergency Use Authorization (EUA). This EUA will remain  in effect (meaning this test can be used) for the duration of the COVID-19 declaration under Section 564(b)(1) of the Act, 21 U.S.C.section 360bbb-3(b)(1), unless the authorization is terminated  or revoked sooner.       Influenza A by PCR NEGATIVE NEGATIVE Final   Influenza B by PCR NEGATIVE NEGATIVE Final    Comment: (NOTE) The Xpert Xpress SARS-CoV-2/FLU/RSV  plus assay is intended as an aid in the diagnosis of influenza from Nasopharyngeal swab specimens and should not be used as a sole basis for treatment. Nasal washings and aspirates are unacceptable for Xpert Xpress SARS-CoV-2/FLU/RSV testing.  Fact Sheet for Patients: EntrepreneurPulse.com.au  Fact Sheet for Healthcare Providers: IncredibleEmployment.be  This test is not yet approved or cleared by the [place] FDA and has been authorized for detection and/or diagnosis of SARS-CoV-2 by FDA under an Emergency Use Authorization (EUA). This EUA will remain in effect (meaning this test can be used) for the duration of the COVID-19 declaration under Section 564(b)(1) of the Act, 21 U.S.C. section  360bbb-3(b)(1), unless the authorization is terminated or revoked.  Performed at [facility], [address], [place], [place] [address]   Blood Culture (routine x 2)     Status: None (Preliminary result)   Collection Time: 01/10/22  5:20 PM   Specimen: BLOOD RIGHT FOREARM  Result Value Ref Range Status   Specimen Description   Final    BLOOD RIGHT FOREARM Performed at [facility], [address], [place], [place] [address]    Special Requests   Final    BOTTLES DRAWN AEROBIC AND ANAEROBIC Blood Culture results may not be optimal due to an inadequate volume of blood received in culture bottles Performed at [facility], [address] [place], [place] [address]    Culture  Setup Time   Final    GRAM POSITIVE COCCI BOTTLES DRAWN AEROBIC AND ANAEROBIC Gram Stain Report Called to,Read Back By and Verified With: FERRAINLO,[email or web address]  BT MATTHEWS,B 7.19.2023 Performed at [facility], [address], [place], [place] [address]    Culture [facility] POSITIVE COCCI  Final   Report Status PENDING  Incomplete  Blood Culture (routine x 2)     Status: Abnormal (Preliminary result)   Collection Time: 01/10/22  5:20 PM   Specimen: BLOOD  Result Value Ref Range Status   Specimen Description   Final    BLOOD RIGHT ANTECUBITAL Performed at [facility], [place] [address] [place], [place] [address]    Special Requests   Final    BOTTLES DRAWN AEROBIC AND ANAEROBIC Blood Culture adequate volume Performed at [facility], [address], [place], [place] [address]    Culture  Setup Time   Final    GRAM POSITIVE COCCI BOTTLES DRAWN AEROBIC AND ANAEROBIC Gram Stain Report Called to,Read Back By and Verified With: FERRAINLO,[email or web address]  BY MAATHEWS,B 7.19.2023 Organism ID to follow CRITICAL RESULT CALLED TO, READ BACK BY AND VERIFIED WITH: G. COFFEE PHARMD, AT 1029 [id number] D. VANHOOK    Culture (A)  Final    GROUP B STREP(S.AGALACTIAE)ISOLATED SUSCEPTIBILITIES TO FOLLOW Performed at [facility], [place] [address], [name],  [address]    Report Status PENDING  Incomplete  Blood Culture ID Panel (Reflexed)     Status: Abnormal   Collection Time: 01/10/22  5:20 PM  Result Value Ref Range Status   Enterococcus faecalis NOT DETECTED NOT DETECTED Final   Enterococcus Faecium NOT DETECTED NOT DETECTED Final   Listeria monocytogenes NOT DETECTED NOT DETECTED Final   Staphylococcus species NOT DETECTED NOT DETECTED Final   Staphylococcus aureus (BCID) NOT DETECTED NOT DETECTED Final   Staphylococcus epidermidis NOT DETECTED NOT DETECTED Final   Staphylococcus lugdunensis NOT DETECTED NOT DETECTED Final   Streptococcus species DETECTED (A) NOT DETECTED Final    Comment: CRITICAL RESULT CALLED TO, READ BACK BY AND VERIFIED WITH: G. COFFEE PHARMD, AT 1029  [id number] D. VANHOOK    Streptococcus agalactiae DETECTED (A) NOT DETECTED Final    Comment: CRITICAL RESULT CALLED TO, READ BACK BY AND VERIFIED WITH: G. COFFEE PHARMD, AT 1029 [id number] D. VANHOOK    Streptococcus pneumoniae NOT DETECTED NOT DETECTED Final   Streptococcus pyogenes NOT DETECTED NOT DETECTED Final   A.calcoaceticus-baumannii NOT DETECTED NOT DETECTED Final   Bacteroides fragilis NOT DETECTED NOT DETECTED Final   Enterobacterales NOT DETECTED NOT DETECTED Final   Enterobacter cloacae complex NOT DETECTED NOT DETECTED Final   Escherichia coli NOT DETECTED NOT DETECTED Final   Klebsiella aerogenes NOT DETECTED NOT DETECTED Final   Klebsiella oxytoca NOT DETECTED NOT DETECTED Final   Klebsiella pneumoniae NOT DETECTED NOT DETECTED Final   Proteus species NOT DETECTED NOT DETECTED Final   Salmonella species NOT DETECTED NOT DETECTED Final   Serratia marcescens NOT DETECTED NOT DETECTED Final   Haemophilus influenzae NOT DETECTED NOT DETECTED Final   Neisseria meningitidis NOT DETECTED NOT DETECTED Final   Pseudomonas aeruginosa NOT DETECTED NOT DETECTED Final   Stenotrophomonas maltophilia NOT DETECTED NOT DETECTED Final   Candida  albicans NOT DETECTED NOT DETECTED Final   Candida auris NOT DETECTED NOT DETECTED Final   Candida glabrata NOT DETECTED NOT DETECTED Final   Candida krusei NOT DETECTED NOT DETECTED Final   Candida parapsilosis NOT DETECTED NOT DETECTED Final   Candida tropicalis NOT DETECTED NOT DETECTED Final   Cryptococcus neoformans/gattii NOT DETECTED NOT DETECTED Final    Comment: Performed at [facility], [address], [place], [place] [address]  Urine Culture     Status: Abnormal (Preliminary result)   Collection Time: 01/10/22  5:48 PM   Specimen: Urine, Clean Catch  Result Value Ref Range Status   Specimen Description   Final    URINE, CLEAN CATCH Performed at [facility], [address] [place], [place] [address]    Special Requests   Final    NONE Performed at [facility], [address] [place], [place] [address]    Culture (A)  Final    >=100,000 COLONIES/mL ESCHERICHIA COLI SUSCEPTIBILITIES TO FOLLOW Performed at [place] [address] [place], [place] [address]    Report Status PENDING  Incomplete  Culture, blood (x 2)     Status: None (Preliminary result)   Collection Time: 01/11/22  7:48 AM   Specimen: BLOOD  Result Value Ref Range Status   Specimen Description   Final    BLOOD BLOOD RIGHT HAND Performed at [facility], [place] [address], [place], [place] [address]    Special Requests   Final    BOTTLES DRAWN AEROBIC AND ANAEROBIC Blood Culture results may not be optimal due to an inadequate volume of blood received in culture bottles Performed at [facility], [address] [place], [place] [address]    Culture   Final    NO GROWTH 1 DAY Performed at [facility], [address], [place], [place] [address]    Report Status PENDING  Incomplete  Culture, blood (x 2)     Status: None (Preliminary result)   Collection Time: 01/11/22  7:58 AM   Specimen: BLOOD  Result Value Ref Range Status    Specimen Description   Final    BLOOD BLOOD RIGHT HAND Performed at [facility], [place] [address] [place],  [address]    Special Requests   Final    BOTTLES DRAWN AEROBIC AND  ANAEROBIC Blood Culture results may not be optimal due to an inadequate volume of blood received in culture bottles Performed at [facility], [address], [place], [place] [address]    Culture   Final    NO GROWTH 1 DAY Performed at [facility], [address] [place], [place] [address]    Report Status PENDING  Incomplete  MRSA Next Gen by PCR, Nasal     Status: Abnormal   Collection Time: 01/11/22 11:02 AM   Specimen: Nasal Mucosa; Nasal Swab  Result Value Ref Range Status   MRSA by PCR Next Gen DETECTED (A) NOT DETECTED Final    Comment: RESULT CALLED TO, READ BACK BY AND VERIFIED WITH: DANNY GREBER @ [id number] ON 01/11/22 C VARNER (NOTE) The GeneXpert MRSA Assay (FDA approved for NASAL specimens only), is one component of a comprehensive MRSA colonization surveillance program. It is not intended to diagnose MRSA infection nor to guide or monitor treatment for MRSA infections. Test performance is not FDA approved in patients less than 53 years old. Performed at [facility], [address], [place], [place] [address]   Culture, blood (Routine X 2) w Reflex to ID Panel     Status: None (Preliminary result)   Collection Time: 01/12/22  1:53 AM   Specimen: BLOOD RIGHT FOREARM  Result Value Ref Range Status   Specimen Description BLOOD RIGHT FOREARM  Final   Special Requests   Final    BOTTLES DRAWN AEROBIC ONLY Blood Culture adequate volume   Culture   Final    NO GROWTH < 12 HOURS Performed at [facility], [address], [place], [place] [address]    Report Status PENDING  Incomplete  Culture, blood (Routine X 2) w Reflex to ID Panel     Status: None (Preliminary result)   Collection Time: 01/12/22  1:53 AM   Specimen: Right Antecubital; Blood   Result Value Ref Range Status   Specimen Description RIGHT ANTECUBITAL  Final   Special Requests   Final    BOTTLES DRAWN AEROBIC AND ANAEROBIC Blood Culture results may not be optimal due to an excessive volume of blood received in culture bottles   Culture   Final    NO GROWTH < 12 HOURS Performed at [facility], [address], [place], [place] [address]    Report Status PENDING  Incomplete    Radiology Reports No results found.  SIGNED: Deatra James, MD, FHM. > 66 min of critical time was spent seeing evaluating patient, stabilizing patient, discussing plan of care with ICU staff and consultants.  Plan of care and managing hypertension, sepsis, septic shock   [facility],  Pager (please use [email or web address] to page/text) Please use Epic Secure Chat for non-urgent communication (7AM-7PM)  If 7PM-7AM, please contact night-coverage [email or web address], 01/12/2022, 11:19 AM

## 2022-01-12 NOTE — Progress Notes (Signed)
Dr Meshilem Shelter was made aware that PO Metoprolol was given at 1421 and that heart rate was still going between 130-140's. Patient denies any pain, or discomfort. New orders were placed per Dr Jonas Shelter and given with good effect. Will continue to monitor.

## 2022-01-12 NOTE — Progress Notes (Signed)
Subjective:  Seeming to be better today by descriptions-  getting ready to start HD   Objective Vital signs in last 24 hours: Vitals:   01/12/22 0700 01/12/22 0800 01/12/22 0904 01/12/22 0907  BP: 137/67 (!) 144/78    Pulse: 93 95    Resp: 16 19    Temp:  98.1 F (36.7 C)    TempSrc:  Oral    SpO2: 100% 98%    Weight:   111.1 kg 111.1 kg  Height:       Weight change: 2.237 kg  Intake/Output Summary (Last 24 hours) at 01/12/2022 3149 Last data filed at 01/11/2022 1000 Gross per 24 hour  Intake 74.33 ml  Output --  Net 74.33 ml   Dialysis Orders: Center: [facility]  on TTS . EDW 110 kg HD Bath 2K/2.5Ca  Time 4 hours Heparin none. Access LUE AVF BFR 400 DFR 500    Venofer  50 mg IV once a week     Assessment/ Plan: Pt is a 66 y.o. yo male with ESRD who was admitted on 01/10/2022 with fever, weakness-  sepsis syndrome   Assessment/Plan: 1. Fever/presumed sepsis-  thought due to leg wound ? Now on cefipime and vanc-  clinically improved -   off pressors  2. ESRD - normally TTS [facility]-  planning for routine HD today via AVF 3. Anemia - not a major issue right now-  hgb just below 10-  will give ESA but no iron in the setting of sepsis 4. Secondary hyperparathyroidism- cont renvela-  is not on vit D or sensipar it seems  5. HTN/volume-  massively volume overloaded-  pitting edema to his abdominal wall-  I think his EDW needs to be lowered as OP-  will attempt max UF today -  was on pressors but no longer-  on midodrine as well   Louis Meckel    Labs: Basic Metabolic Panel: Recent Labs  Lab 01/10/22 1713 01/11/22 0337 01/11/22 0749  NA 133* 128* 131*  K 4.2 4.3 4.7  CL 96* 95* 99  CO2 26 21* 20*  GLUCOSE 129* 314* 204*  BUN 55* 52* 58*  CREATININE 4.34* 4.26* 4.49*  CALCIUM 7.7* 7.3* 7.5*  PHOS  --  4.5  --    Liver Function Tests: Recent Labs  Lab 01/10/22 1713 01/11/22 0337 01/11/22 0749  AST '21 21 21  '$ ALT '18 18 19  '$ ALKPHOS 187* 162* 168*   BILITOT 2.2* 2.5* 2.5*  PROT 7.4 6.5 6.7  ALBUMIN 2.0* 1.8* 1.8*   No results for input(s): "LIPASE", "AMYLASE" in the last 168 hours. No results for input(s): "AMMONIA" in the last 168 hours. CBC: Recent Labs  Lab 01/10/22 1713 01/11/22 0337 01/11/22 0749  WBC 10.6* 20.5* 19.1*  NEUTROABS 10.1*  --  16.6*  HGB 9.6* 9.2* 9.5*  HCT 29.7* 29.3* 30.2*  MCV 84.1 84.9 85.1  PLT 251 261 280   Cardiac Enzymes: No results for input(s): "CKTOTAL", "CKMB", "CKMBINDEX", "TROPONINI" in the last 168 hours. CBG: Recent Labs  Lab 01/11/22 0743 01/11/22 1201 01/11/22 1604 01/11/22 2110 01/12/22 0747  GLUCAP 188* 210* 197* 197* 132*    Iron Studies: No results for input(s): "IRON", "TIBC", "TRANSFERRIN", "FERRITIN" in the last 72 hours. Studies/Results: US Venous Img Lower Bilateral (DVT)  Result Date: 01/11/2022 CLINICAL DATA:  Bilateral lower extremity edema and pain. EXAM: BILATERAL LOWER EXTREMITY VENOUS DOPPLER ULTRASOUND TECHNIQUE: Gray-scale sonography with graded compression, as well as color Doppler and duplex ultrasound  were performed to evaluate the lower extremity deep venous systems from the level of the common femoral vein and including the common femoral, femoral, profunda femoral, popliteal and calf veins including the posterior tibial, peroneal and gastrocnemius veins when visible. The superficial great saphenous vein was also interrogated. Spectral Doppler was utilized to evaluate flow at rest and with distal augmentation maneuvers in the common femoral, femoral and popliteal veins. COMPARISON:  None Available. FINDINGS: RIGHT LOWER EXTREMITY Common Femoral Vein: No evidence of thrombus. Normal compressibility, respiratory phasicity and response to augmentation. Saphenofemoral Junction: No evidence of thrombus. Normal compressibility and flow on color Doppler imaging. Profunda Femoral Vein: No evidence of thrombus. Normal compressibility and flow on color Doppler imaging.  Femoral Vein: No evidence of thrombus. Normal compressibility, respiratory phasicity and response to augmentation. Popliteal Vein: No evidence of thrombus. Normal compressibility, respiratory phasicity and response to augmentation. Calf Veins: No evidence of thrombus. Normal compressibility and flow on color Doppler imaging. LEFT LOWER EXTREMITY Common Femoral Vein: No evidence of thrombus. Normal compressibility, respiratory phasicity and response to augmentation. Saphenofemoral Junction: No evidence of thrombus. Normal compressibility and flow on color Doppler imaging. Profunda Femoral Vein: No evidence of thrombus. Normal compressibility and flow on color Doppler imaging. Femoral Vein: No evidence of thrombus. Normal compressibility, respiratory phasicity and response to augmentation. Popliteal Vein: No evidence of thrombus. Normal compressibility, respiratory phasicity and response to augmentation. Calf Veins: No evidence of thrombus. Normal compressibility and flow on color Doppler imaging. IMPRESSION: No evidence of deep venous thrombosis in either lower extremity. Electronically Signed   By: Margaretha Sheffield M.D.   On: 01/11/2022 09:17   DG Chest Port 1 View  Result Date: 01/10/2022 CLINICAL DATA:  Questionable sepsis, Pt is a MWF dialysis pt who went today and reportedly did not receive dialysis, or did not have any fluid off (conflicting stories). Pt has been increasingly weak and today staff noticed him to be febrile. EXAM: PORTABLE CHEST - 1 VIEW COMPARISON:  12/16/2021 FINDINGS: Lungs are clear. Heart size and mediastinal contours are within normal limits. Blunting of lateral costophrenic angles suggesting small effusions. Visualized bones unremarkable. IMPRESSION: Possible small pleural effusions.  No overt interstitial edema. Electronically Signed   By: Lucrezia Europe M.D.   On: 01/10/2022 17:33   Medications: Infusions:  sodium chloride Stopped (01/10/22 2059)   sodium chloride Stopped (01/11/22  1001)   ceFEPime (MAXIPIME) IV 1 g (01/11/22 2317)   metronidazole 500 mg (01/12/22 0636)   phenylephrine (NEO-SYNEPHRINE) Adult infusion Stopped (01/12/22 0415)   vancomycin     vasopressin Stopped (01/12/22 0130)    Scheduled Medications:  apixaban  5 mg Oral BID   Chlorhexidine Gluconate Cloth  6 each Topical Q0600   ferrous sulfate  325 mg Oral Daily   insulin aspart  0-6 Units Subcutaneous TID WC   midodrine  10 mg Oral BID   mupirocin ointment   Nasal BID   omega-3 acid ethyl esters  1 g Oral Daily   rosuvastatin  5 mg Oral QPM   senna  2 tablet Oral BID   sevelamer carbonate  800 mg Oral BID WC    have reviewed scheduled and prn medications.  Physical Exam: General:  obese, slow mentally , c/o pain  really all over Heart: RRR Lungs: dec BS at the bases Abdomen: obese-  pitting edema to abdominal wall  Extremities: pitting edema-  mult abrasions-  left ankle wrapped  Dialysis Access: AVF     01/12/2022,9:23 AM  LOS:  2 days

## 2022-01-12 NOTE — Progress Notes (Signed)
Patient noted to be in Afib RVR. With heart rates into higher 140s. EKG obtained. Asymptomatic. Complaints of nausea. Zofran given. Dr. Careem Shelter aware. Patient had episode earlier, but broke out on his own. Will monitor heart rate. Dialysis nurse at bedside.

## 2022-01-12 NOTE — Progress Notes (Signed)
Palliative:  HPI: 66 y.o. male  with past medical history of ESRD on HD, HTN, afib on Eliquis, HFpEF, anemia of chronic illness, diabetes, foot ulcer (concern for early osteomyelitis on past admission), gout, urinary retention admitted from [facility] facility on 01/10/2022 with sepsis potentially due to right leg cellulitis.   I met again today with Kareen. He is currently receiving dialysis. He is a little sleepy after receiving pain medication. He tells me that he has a cough that bothers him - we discuss potentially from fluid or reflux. He does report that it may be around mealtime. Will add PPI. This is his main complaint. He has some generalized aches but likely from being ill and lying in bed more than usual. He is overall continuing to feel better just tired right now. Tolerating dialysis so far. I left him to rest.   I called sister/HCPOA, Viola, at [place]'s request. Viola and I discussed infection, sepsis, progress, and plan moving forward. I did explain that he is showing some improvements and I am hopeful that he will ultimately improve from this illness. We did discuss overall poor prognosis and high risk for future complications given his declining functional status. Viola is very realistic and wants to treat what we can but wants him to be comfortable and as happy as possible in the context of his health circumstances. I shared with her his desire for DNR and she reports that he has shared these wishes with her and she supports this decision.   All questions/concerns addressed. Emotional support provided.   Exam: Alert but sleepy. No distress. Breathing regular, unlabored on 4L. Abd soft. Warm to touch. Generalized weakness and fatigue.   Plan: - DNR - Hopeful for improvement  50 min   , NP Palliative Medicine Team Pager [phone] (Please see [email or web address] for schedule) Team Phone [phone]    Greater than 50%  of this time was spent counseling and  coordinating care related to the above assessment and plan   

## 2022-01-12 NOTE — Tx Team (Signed)
Received patient in bed, alert and oriented. Informed consent signed and in chart.  Time tx completed: 1330  HD treatment completed. Patient tolerated well. Fistula without signs and symptoms of complications. Patient in room, alert and orient and in no acute distress. RN at bedside report given.   Total UF removed: 3 L  Medication given: Midodrine 10 mg given prior to HD at 0909. Darbepoetin Alfa given at 1055 during HD. Albumin x 3 prn for SBP<110 NOT given during HD treatment.    Post HD VS: BP 127/57 MAP 79, HR 123 A fib on the monitor, o2 100% [place] 4L, RR 23.   Post HD weight: 118.6 kg

## 2022-01-12 NOTE — Inpatient Diabetes Management (Signed)
Inpatient Diabetes Program Recommendations  AACE/ADA: New Consensus Statement on Inpatient Glycemic Control   Target Ranges:  Prepandial:   less than 140 mg/dL      Peak postprandial:   less than 180 mg/dL (1-2 hours)      Critically ill patients:  140 - 180 mg/dL    Latest Reference Range & Units 01/11/22 07:43 01/11/22 12:01 01/11/22 16:04 01/11/22 21:10  Glucose-Capillary 70 - 99 mg/dL 188 (H) 210 (H) 197 (H) 197 (H)   Review of Glycemic Control  Diabetes history: DM2 Outpatient Diabetes medications: 70/30 5 units BID Current orders for Inpatient glycemic control: Novolog 0-6 units TID with meals  Inpatient Diabetes Program Recommendations:    Insulin: Please consider increasing Novolog correction to 0-9 units TID with meals.  Thanks, Barnie Alderman, RN, MSN, [place] Diabetes Coordinator Inpatient Diabetes Program [phone] (Team Pager from 8am to [place])

## 2022-01-12 NOTE — Consult Note (Signed)
[facility] Nurse Consult Note: Patient receiving care in [facility] ICU 7. Reason for Consult: "Rt leg wound" After reviewing the patient record, and speaking with his primary RN Anderson Malta over the phone, it appears that my colleague Julien Girt, provided orders for the right ankle yesterday. Please re-consult the [place] if needed. [place] nurse will not follow at this time.  Please re-consult the [place] team if needed.  Val Riles, RN, MSN, CWOCN, CNS-BC, pager [phone]

## 2022-01-13 ENCOUNTER — Inpatient Hospital Stay (HOSPITAL_COMMUNITY): Payer: Medicare Other

## 2022-01-13 ENCOUNTER — Other Ambulatory Visit: Payer: Self-pay

## 2022-01-13 ENCOUNTER — Encounter (HOSPITAL_COMMUNITY): Payer: Self-pay | Admitting: Family Medicine

## 2022-01-13 DIAGNOSIS — I4819 Other persistent atrial fibrillation: Secondary | ICD-10-CM | POA: Diagnosis not present

## 2022-01-13 DIAGNOSIS — Z96659 Presence of unspecified artificial knee joint: Secondary | ICD-10-CM

## 2022-01-13 DIAGNOSIS — R6520 Severe sepsis without septic shock: Secondary | ICD-10-CM

## 2022-01-13 DIAGNOSIS — E1121 Type 2 diabetes mellitus with diabetic nephropathy: Secondary | ICD-10-CM

## 2022-01-13 DIAGNOSIS — G9340 Encephalopathy, unspecified: Principal | ICD-10-CM

## 2022-01-13 DIAGNOSIS — N186 End stage renal disease: Secondary | ICD-10-CM | POA: Diagnosis not present

## 2022-01-13 DIAGNOSIS — J9601 Acute respiratory failure with hypoxia: Secondary | ICD-10-CM

## 2022-01-13 DIAGNOSIS — M86679 Other chronic osteomyelitis, unspecified ankle and foot: Secondary | ICD-10-CM

## 2022-01-13 DIAGNOSIS — B955 Unspecified streptococcus as the cause of diseases classified elsewhere: Secondary | ICD-10-CM

## 2022-01-13 DIAGNOSIS — R7881 Bacteremia: Secondary | ICD-10-CM

## 2022-01-13 DIAGNOSIS — A419 Sepsis, unspecified organism: Secondary | ICD-10-CM

## 2022-01-13 DIAGNOSIS — R6521 Severe sepsis with septic shock: Secondary | ICD-10-CM | POA: Diagnosis not present

## 2022-01-13 DIAGNOSIS — G9341 Metabolic encephalopathy: Secondary | ICD-10-CM | POA: Diagnosis not present

## 2022-01-13 LAB — COMPREHENSIVE METABOLIC PANEL
ALT: 23 U/L (ref 0–44)
AST: 37 U/L (ref 15–41)
Albumin: <1.5 g/dL — ABNORMAL LOW (ref 3.5–5.0)
Alkaline Phosphatase: 180 U/L — ABNORMAL HIGH (ref 38–126)
Anion gap: 7 (ref 5–15)
BUN: 45 mg/dL — ABNORMAL HIGH (ref 8–23)
CO2: 26 mmol/L (ref 22–32)
Calcium: 7.4 mg/dL — ABNORMAL LOW (ref 8.9–10.3)
Chloride: 98 mmol/L (ref 98–111)
Creatinine, Ser: 3.61 mg/dL — ABNORMAL HIGH (ref 0.61–1.24)
GFR, Estimated: 18 mL/min — ABNORMAL LOW (ref >60)
Glucose, Bld: 165 mg/dL — ABNORMAL HIGH (ref 70–99)
Potassium: 4.2 mmol/L (ref 3.5–5.1)
Sodium: 131 mmol/L — ABNORMAL LOW (ref 135–145)
Total Bilirubin: 1.9 mg/dL — ABNORMAL HIGH (ref 0.3–1.2)
Total Protein: 5.6 g/dL — ABNORMAL LOW (ref 6.5–8.1)

## 2022-01-13 LAB — IRON AND TIBC
Iron: 28 ug/dL — ABNORMAL LOW (ref 45–182)
Saturation Ratios: 22 % (ref 17.9–39.5)
TIBC: 129 ug/dL — ABNORMAL LOW (ref 250–450)
UIBC: 101 ug/dL

## 2022-01-13 LAB — CULTURE, BLOOD (ROUTINE X 2): Special Requests: ADEQUATE

## 2022-01-13 LAB — GLUCOSE, CAPILLARY
Glucose-Capillary: 110 mg/dL — ABNORMAL HIGH (ref 70–99)
Glucose-Capillary: 137 mg/dL — ABNORMAL HIGH (ref 70–99)
Glucose-Capillary: 175 mg/dL — ABNORMAL HIGH (ref 70–99)
Glucose-Capillary: 175 mg/dL — ABNORMAL HIGH (ref 70–99)
Glucose-Capillary: 190 mg/dL — ABNORMAL HIGH (ref 70–99)

## 2022-01-13 LAB — CBC
HCT: 24.3 % — ABNORMAL LOW (ref 39.0–52.0)
Hemoglobin: 7.8 g/dL — ABNORMAL LOW (ref 13.0–17.0)
MCH: 26.6 pg (ref 26.0–34.0)
MCHC: 32.1 g/dL (ref 30.0–36.0)
MCV: 82.9 fL (ref 80.0–100.0)
Platelets: 229 10*3/uL (ref 150–400)
RBC: 2.93 MIL/uL — ABNORMAL LOW (ref 4.22–5.81)
RDW: 19.6 % — ABNORMAL HIGH (ref 11.5–15.5)
WBC: 10.2 10*3/uL (ref 4.0–10.5)
nRBC: 0.2 % (ref 0.0–0.2)

## 2022-01-13 LAB — URINE CULTURE: Culture: >=100000 — AB

## 2022-01-13 LAB — MAGNESIUM: Magnesium: 1.8 mg/dL (ref 1.7–2.4)

## 2022-01-13 MED ORDER — ALPRAZOLAM 0.5 MG PO TABS
0.5000 mg | ORAL_TABLET | Freq: Three times a day (TID) | ORAL | Status: DC | PRN
  Administered 2022-01-13 – 2022-01-19 (×2): 0.5 mg via ORAL
  Filled 2022-01-13 (×2): qty 1

## 2022-01-13 MED ORDER — AMIODARONE HCL IN DEXTROSE 360-4.14 MG/200ML-% IV SOLN
60.0000 mg/h | INTRAVENOUS | Status: AC
  Administered 2022-01-13 (×2): 60 mg/h via INTRAVENOUS
  Filled 2022-01-13 (×2): qty 200

## 2022-01-13 MED ORDER — INSULIN DETEMIR 100 UNIT/ML ~~LOC~~ SOLN
5.0000 [IU] | Freq: Every day | SUBCUTANEOUS | Status: DC
  Administered 2022-01-13 – 2022-01-18 (×6): 5 [IU] via SUBCUTANEOUS
  Filled 2022-01-13 (×8): qty 0.05

## 2022-01-13 MED ORDER — AMIODARONE LOAD VIA INFUSION
150.0000 mg | Freq: Once | INTRAVENOUS | Status: AC
  Administered 2022-01-13: 150 mg via INTRAVENOUS
  Filled 2022-01-13: qty 83.34

## 2022-01-13 MED ORDER — AMIODARONE HCL IN DEXTROSE 360-4.14 MG/200ML-% IV SOLN
30.0000 mg/h | INTRAVENOUS | Status: DC
  Administered 2022-01-13 – 2022-01-14 (×2): 30 mg/h via INTRAVENOUS
  Filled 2022-01-13: qty 200

## 2022-01-13 MED ORDER — METOPROLOL SUCCINATE ER 50 MG PO TB24
50.0000 mg | ORAL_TABLET | Freq: Every day | ORAL | Status: DC
  Administered 2022-01-13 – 2022-01-17 (×5): 50 mg via ORAL
  Filled 2022-01-13 (×6): qty 1

## 2022-01-13 MED ORDER — CHLORHEXIDINE GLUCONATE CLOTH 2 % EX PADS
6.0000 | MEDICATED_PAD | Freq: Every day | CUTANEOUS | Status: DC
  Administered 2022-01-13 – 2022-01-16 (×3): 6 via TOPICAL

## 2022-01-13 NOTE — Consult Note (Signed)
Cardiology Consultation:   Patient ID: Danny [name] MRN: [id number]; DOB: July 17, 1955  Admit date: 01/10/2022 Date of Consult: 01/13/2022  PCP:  Celene Squibb, MD   [facility] Providers Cardiologist:  Jenkins Rouge, MD        Patient Profile:   Danny [name] is a 66 y.o. male with a hx of HTN, HLD, Type 2 DM, persistent atrial fibrillation (appears this was diagnosed in 07/2021 while admitted for anemia and recurrence in 09/2021 while admitted for osteomyelitis) and ESRD who is being seen 01/13/2022 for the evaluation of atrial fibrillation with RVR at the request of Dr. Shelton Shelter.  History of Present Illness:   [name] presented to [name] ED on 01/10/2022 from [facility]F for evaluation of worsening fever and weakness.  By review of notes, he was initially found to be hypoxic with oxygen saturations in the 80's and this improved with 4 L nasal cannula. Was febrile to 102.5 upon arrival and tachypneic and tachycardic. Initial labs showed WBC 10.6, Hgb 9.6 (close to baseline), platelets 251, Na+ 133, K+ 4.2 and creatinine 4.34. Albumin 2.0. Alk phos elevated to 187 with AST 21 and ALT 18. Sed Rate 100 and CRP 24. Lactic acid elevated, peaking at 3.0. CXR showed possible small pleural effusions with no overt edema. UA was concerning for UTI and urine culture has resulted positive for E. Coli.   He was also hypotensive on arrival and was admitted for septic shock in the setting of UTI and right leg cellulitis. Was started on Levophed along with IV antibiotics and IV fluids. He was continued on Eliquis at the time of admission but Toprol-XL was held given hypotension. Palliative Care was consulted and he has been made DNR with plans to continue treatment at this time.  Was noted to have episodes of atrial fibrillation with RVR yesterday afternoon and EKG showed atrial fibrillation with RVR, heart rate 126 with borderline LVH. Appears he was in atrial fibrillation until 2330 and converted back to NSR.  Had recurrence starting around 0500 today with rates in the 110's to 130's. Pressor support was stopped yesterday and he was restarted on Toprol-XL 25 mg daily with this increased to 50 mg daily for today by the admitting team.  He has remained on Midodrine 63m BID.   He reports he has been symptomatic with his rhythm in the past and experienced palpitations but denies any recent symptoms at this time. Feels that his breathing is improving. Has experienced worsening lower extremity edema and weight is listed as having increased this admission but going from 244 lbs yesterday to 262 lbs today so doubt the accuracy of this. He also had HD yesterday with 3L removed.    Past Medical History:  Diagnosis Date   Anemia    Anemia in ESRD (end-stage renal disease) ([facility] 11/28/2021   Ankle ulcer ([facility]    Arthritis    CKD (chronic kidney disease), stage V ([facility]    Diabetes mellitus without complication ([facility]    Diastolic congestive heart failure ([facility]    Foot ulcer ([facility]    Gout    Hypertension    Urinary retention     Past Surgical History:  Procedure Laterality Date   ANKLE SURGERY Right    AV FISTULA PLACEMENT Left 04/26/2021   Procedure: LEFT ARM ARTERIOVENOUS (AV) FISTULA CREATION (BRACHIOCEPHALIC);  Surgeon: ERosetta Posner MD;  Location: [facility];  Service: Vascular;  Laterality: Left;   CHOLECYSTECTOMY     FISTULA SUPERFICIALIZATION Left  07/12/2021   Procedure: LEFT ARM ARTERIOVENOUS FISTULA SUPERFICIALIZATION;  Surgeon: Rosetta Posner, MD;  Location: [facility];  Service: Vascular;  Laterality: Left;   FOOT SURGERY Right    INSERTION OF DIALYSIS CATHETER Right 01/14/2021   Procedure: INSERTION OF TUNNELED DIALYSIS CATHETER RIGHT INTERNAL JUGULAR;  Surgeon: Virl Cagey, MD;  Location: [facility];  Service: General;  Laterality: Right;   REMOVAL OF A DIALYSIS CATHETER N/A 09/06/2021   Procedure: MINOR REMOVAL OF TUNNELED DIALYSIS CATHETER;  Surgeon: Rosetta Posner, MD;  Location: [facility];  Service:  Vascular;  Laterality: N/A;   TRANSURETHRAL RESECTION OF PROSTATE N/A 01/15/2020   Procedure: TRANSURETHRAL RESECTION OF THE PROSTATE (TURP)  with General anesthesia and spinal;  Surgeon: Cleon Gustin, MD;  Location: [facility];  Service: Urology;  Laterality: N/A;     Home Medications:  Prior to Admission medications   Medication Sig Start Date End Date Taking? Authorizing Provider  cholecalciferol (VITAMIN D3) 25 MCG (1000 UNIT) tablet Take 1,000 Units by mouth See admin instructions. Monday's, Wednesday's, Friday's, and Sunday's in the morning,and Tuesday's, Thursday's, and Saturday's at bedtime   Yes [provider]  docusate sodium (COLACE) 100 MG capsule Take 100 mg by mouth 2 (two) times daily.   Yes [provider]  fluticasone (FLONASE) 50 MCG/ACT nasal spray Place 2 sprays into both nostrils daily. 06/01/21  Yes [provider]  folic acid (FOLVITE) 841 MCG tablet Take 1 tablet (400 mcg total) by mouth daily. 08/29/21  Yes Pennington, Rebekah M, PA-C  Insulin NPH Isophane & Regular (HUMULIN 70/30 KWIKPEN [place]) Inject 5 Units into the skin 2 (two) times daily.   Yes [provider]  Lactulose 20 GM/30ML SOLN Take 30 mLs (20 g total) by mouth daily. 11/11/20  Yes [name], MD  metoprolol succinate (TOPROL XL) 50 MG 24 hr tablet Take 1 tablet (50 mg total) by mouth in the morning and at bedtime. Take with or immediately following a meal. 08/15/21 08/15/22 Yes Barton Dubois, MD  midodrine (PROAMATINE) 10 MG tablet Take 10 mg by mouth Every Tuesday,Thursday,and Saturday with dialysis. 08/06/21  Yes [provider]  Multiple Vitamin (MULTIVITAMIN WITH MINERALS) TABS tablet Take 1 tablet by mouth daily.   Yes [provider]  Omega-3 Fatty Acids (FISH OIL) 500 MG CAPS Take 500 mg by mouth 2 (two) times daily.   Yes [provider]  ondansetron (ZOFRAN) 4 MG tablet Take 4 mg by mouth every 6 (six) hours as needed for nausea or  vomiting.   Yes [provider]  OXYCODONE HCL PO Take 5 mg by mouth every 6 (six) hours as needed (SEVERE PAIN).   Yes [provider]  polyethylene glycol (MIRALAX / GLYCOLAX) 17 g packet Take 17 g by mouth daily. 01/28/21  Yes Kathie Dike, MD  rosuvastatin (CRESTOR) 5 MG tablet Take 5 mg by mouth every evening.   Yes [provider]  senna (SENOKOT) 8.6 MG tablet Take 2 tablets by mouth 2 (two) times daily.   Yes [provider]  sevelamer carbonate (RENVELA) 800 MG tablet Take 1 tablet (800 mg total) by mouth 2 (two) times daily with a meal. 01/27/21  Yes Kathie Dike, MD  tamsulosin (FLOMAX) 0.4 MG CAPS capsule Take 1 capsule (0.4 mg total) by mouth daily after supper. 09/09/21  Yes [name], [name], MD  vitamin C (ASCORBIC ACID) 500 MG tablet Take 1,000 mg by mouth 2 (two) times daily.   Yes [provider]  vitamin E 1000 UNIT capsule Take 1,000 Units by mouth See admin instructions. Monday's, Wednesday's, Friday's, and Sunday's in the morning, and Tuesday's, Thursday's, and Saturday's at bedtime.   Yes [provider]  zinc sulfate 220 (50 Zn) MG capsule Take 220 mg by mouth daily.   Yes [provider]  acetaminophen (TYLENOL) 500 MG tablet Take 1,000 mg by mouth every 6 (six) hours as needed for moderate pain or headache.    [provider]  apixaban (ELIQUIS) 5 MG TABS tablet Take 1 tablet (5 mg total) by mouth 2 (two) times daily. 01/27/21   Kathie Dike, MD  cephALEXin (KEFLEX) 500 MG capsule Take 1 capsule (500 mg total) by mouth 4 (four) times daily. Patient not taking: Reported on 01/10/2022 12/16/21   Roemhildt, Lorin T, PA-C  Cyanocobalamin (B-12 COMPLIANCE INJECTION) 1000 MCG/ML KIT Inject 1,000 mcg as directed every 28 (twenty-eight) days. Patient not taking: Reported on 01/10/2022    [provider]  ferrous sulfate 325 (65 FE) MG tablet Take 325 mg by mouth 2 (two) times daily.    [provider]    Inpatient Medications: Scheduled Meds:  acidophilus  1 capsule Oral TID WC   apixaban  5 mg Oral BID   Chlorhexidine Gluconate Cloth  6 each Topical Q0600   ciprofloxacin  500 mg Oral Q24H   darbepoetin (ARANESP) injection - NON-DIALYSIS  100 mcg Subcutaneous Q Thu-1800   ferrous sulfate  325 mg Oral Daily   insulin aspart  0-6 Units Subcutaneous TID WC   insulin detemir  5 Units Subcutaneous Daily   metoprolol succinate  50 mg Oral Daily   midodrine  10 mg Oral BID   mupirocin ointment   Nasal BID   omega-3 acid ethyl esters  1 g Oral Daily   pantoprazole  40 mg Oral Daily   rosuvastatin  5 mg Oral QPM   senna  2 tablet Oral BID   sevelamer carbonate  800 mg Oral BID WC   Continuous Infusions:  sodium chloride Stopped (01/11/22 0915)   pencillin G potassium IV 2 Million Units (01/13/22 0805)   phenylephrine (NEO-SYNEPHRINE) Adult infusion Stopped (01/12/22 0415)   PRN Meds: acetaminophen **OR** acetaminophen, levalbuterol, metoprolol tartrate, ondansetron **OR** ondansetron (ZOFRAN) IV, pentafluoroprop-tetrafluoroeth, sodium chloride  Allergies:    Allergies  Allergen Reactions   Dust Mite Extract Itching and Other (See Comments)    Unknown reaction-potential shortness of breath   Prednisone Nausea And Vomiting   Rocephin [Ceftriaxone] Nausea And Vomiting    Social History:   Social History   Socioeconomic History   Marital status: Divorced    Spouse name: Not on file   Number of children: Not on file   Years of education: Not on file   Highest education level: Not on file  Occupational History   Not on file  Tobacco Use   Smoking status: Former    Packs/day: 1.50    Years: 10.00    Total pack years: 15.00    Types: Cigarettes    Quit date: 06/02/1987    Years since quitting: 34.6    Passive exposure: Past   Smokeless tobacco: Never  Vaping Use   Vaping Use: Never used  Substance and Sexual Activity   Alcohol use: Not Currently   Drug  use: Never   Sexual activity: Not Currently  Other Topics Concern   Not on file  Social History Narrative   Not on file   Social Determinants of  Health   Financial Resource Strain: Low Risk  (09/13/2020)   Overall Financial Resource Strain (CARDIA)    Difficulty of Paying Living Expenses: Not very hard  Food Insecurity: No Food Insecurity (09/13/2020)   Hunger Vital Sign    Worried About Running Out of Food in the Last Year: Never true    Ran Out of Food in the Last Year: Never true  Transportation Needs: No Transportation Needs (09/13/2020)   PRAPARE - Hydrologist (Medical): No    Lack of Transportation (Non-Medical): No  Physical Activity: Inactive (09/13/2020)   Exercise Vital Sign    Days of Exercise per Week: 0 days    Minutes of Exercise per Session: 0 min  Stress: No Stress Concern Present (09/13/2020)   [place]    Feeling of Stress : Only a little  Social Connections: Not on file  Intimate Partner Violence: Not At Risk (09/13/2020)   Humiliation, Afraid, Rape, and Kick questionnaire    Fear of Current or Ex-Partner: No    Emotionally Abused: No    Physically Abused: No    Sexually Abused: No    Family History:    Family History  Problem Relation Age of Onset   Diabetes Mother    Heart attack Mother    Heart attack Father    Diabetes Brother      ROS:  Please see the history of present illness.   All other ROS reviewed and negative.     Physical Exam/Data:   Vitals:   01/13/22 0700 01/13/22 0730 01/13/22 0800 01/13/22 0812  BP: (!) 106/46 (!) 116/50 110/72   Pulse: (!) 115 (!) 38 (!) 124 (!) 127  Resp: (!) 21  20   Temp:    98.9 F (37.2 C)  TempSrc:      SpO2: 99% 95% 95%   Weight:      Height:        Intake/Output Summary (Last 24 hours) at 01/13/2022 0820 Last data filed at 01/12/2022 1600 Gross per 24 hour  Intake 1634.71 ml  Output 3 ml  Net  1631.71 ml      01/13/2022    5:00 AM 01/12/2022    9:04 AM 01/12/2022    5:00 AM  Last 3 Weights  Weight (lbs) 262 lb 12.6 oz 244 lb 14.9 oz 244 lb 14.9 oz  Weight (kg) 119.2 kg 111.1 kg 111.1 kg     Body mass index is 34.67 kg/m.  General: Pleasant elderly male appearing in no acute distress.  HEENT: normal Neck: no JVD Vascular: No carotid bruits; Distal pulses 2+ bilaterally Cardiac:  normal S1, S2; Irregularly irregular.  Lungs: decreased breath sounds along bases bilaterally.  Abd: soft, nontender, no hepatomegaly  Ext: 2+ pitting edema Musculoskeletal:  No deformities, BUE and BLE strength normal and equal Skin: warm and dry  Neuro:  CNs 2-12 intact, no focal abnormalities noted Psych:  Normal affect   EKG:  The EKG was personally reviewed and demonstrates: Atrial fibrillation with RVR, heart rate 126 with borderline LVH   Relevant CV Studies:  Echocardiogram: 10/2020 IMPRESSIONS     1. Left ventricular ejection fraction, by estimation, is 55 to 60%. The  left ventricle has normal function. Left ventricular endocardial border  not optimally defined to evaluate regional wall motion. There is moderate  left ventricular hypertrophy. Left  ventricular diastolic parameters are indeterminate.   2. Right ventricular systolic function is  mildly reduced. The right  ventricular size is moderately enlarged. There is severely elevated  pulmonary artery systolic pressure. The estimated right ventricular  systolic pressure is 26.3 mmHg.   3. Left atrial size was mildly dilated.   4. Right atrial size was moderately dilated.   5. The mitral valve is normal in structure. Mild mitral valve  regurgitation. No evidence of mitral stenosis.   6. The aortic valve is grossly normal. Aortic valve regurgitation is not  visualized. No aortic stenosis is present.   7. The inferior vena cava is dilated in size with <50% respiratory  variability, suggesting right atrial pressure of 15 mmHg.    Laboratory Data:  High Sensitivity Troponin:   Recent Labs  Lab 12/16/21 1359 12/16/21 1624  TROPONINIHS 18* 19*     Chemistry Recent Labs  Lab 01/11/22 0337 01/11/22 0749 01/12/22 0940 01/13/22 0353  NA 128* 131* 131* 131*  K 4.3 4.7 4.7 4.2  CL 95* 99 99 98  CO2 21* 20* 22 26  GLUCOSE 314* 204* 163* 165*  BUN 52* 58* 70* 45*  CREATININE 4.26* 4.49* 5.07* 3.61*  CALCIUM 7.3* 7.5* 7.5* 7.4*  MG 2.0  --   --  1.8  GFRNONAA 14* 14* 12* 18*  ANIONGAP 12 12 10 7     Recent Labs  Lab 01/11/22 0749 01/12/22 0940 01/13/22 0353  PROT 6.7 5.9* 5.6*  ALBUMIN 1.8* 1.6* <1.5*  AST 21 26 37  ALT 19 19 23   ALKPHOS 168* 136* 180*  BILITOT 2.5* 1.7* 1.9*   Lipids No results for input(s): "CHOL", "TRIG", "HDL", "LABVLDL", "LDLCALC", "CHOLHDL" in the last 168 hours.  Hematology Recent Labs  Lab 01/11/22 0749 01/12/22 0940 01/13/22 0353  WBC 19.1* 11.9* 10.2  RBC 3.55* 2.87* 2.93*  HGB 9.5* 7.8* 7.8*  HCT 30.2* 23.8* 24.3*  MCV 85.1 82.9 82.9  MCH 26.8 27.2 26.6  MCHC 31.5 32.8 32.1  RDW 20.0* 19.8* 19.6*  PLT 280 243 229   Thyroid No results for input(s): "TSH", "FREET4" in the last 168 hours.  BNPNo results for input(s): "BNP", "PROBNP" in the last 168 hours.  DDimer No results for input(s): "DDIMER" in the last 168 hours.   Radiology/Studies:  US Venous Img Lower Bilateral (DVT)  Result Date: 01/11/2022 CLINICAL DATA:  Bilateral lower extremity edema and pain. EXAM: BILATERAL LOWER EXTREMITY VENOUS DOPPLER ULTRASOUND TECHNIQUE: Gray-scale sonography with graded compression, as well as color Doppler and duplex ultrasound were performed to evaluate the lower extremity deep venous systems from the level of the common femoral vein and including the common femoral, femoral, profunda femoral, popliteal and calf veins including the posterior tibial, peroneal and gastrocnemius veins when visible. The superficial great saphenous vein was also interrogated. Spectral Doppler  was utilized to evaluate flow at rest and with distal augmentation maneuvers in the common femoral, femoral and popliteal veins. COMPARISON:  None Available. FINDINGS: RIGHT LOWER EXTREMITY Common Femoral Vein: No evidence of thrombus. Normal compressibility, respiratory phasicity and response to augmentation. Saphenofemoral Junction: No evidence of thrombus. Normal compressibility and flow on color Doppler imaging. Profunda Femoral Vein: No evidence of thrombus. Normal compressibility and flow on color Doppler imaging. Femoral Vein: No evidence of thrombus. Normal compressibility, respiratory phasicity and response to augmentation. Popliteal Vein: No evidence of thrombus. Normal compressibility, respiratory phasicity and response to augmentation. Calf Veins: No evidence of thrombus. Normal compressibility and flow on color Doppler imaging. LEFT LOWER EXTREMITY Common Femoral Vein: No evidence of thrombus. Normal compressibility, respiratory phasicity  and response to augmentation. Saphenofemoral Junction: No evidence of thrombus. Normal compressibility and flow on color Doppler imaging. Profunda Femoral Vein: No evidence of thrombus. Normal compressibility and flow on color Doppler imaging. Femoral Vein: No evidence of thrombus. Normal compressibility, respiratory phasicity and response to augmentation. Popliteal Vein: No evidence of thrombus. Normal compressibility, respiratory phasicity and response to augmentation. Calf Veins: No evidence of thrombus. Normal compressibility and flow on color Doppler imaging. IMPRESSION: No evidence of deep venous thrombosis in either lower extremity. Electronically Signed   By: Margaretha Sheffield M.D.   On: 01/11/2022 09:17   DG Chest Port 1 View  Result Date: 01/10/2022 CLINICAL DATA:  Questionable sepsis, Pt is a MWF dialysis pt who went today and reportedly did not receive dialysis, or did not have any fluid off (conflicting stories). Pt has been increasingly weak and today  staff noticed him to be febrile. EXAM: PORTABLE CHEST - 1 VIEW COMPARISON:  12/16/2021 FINDINGS: Lungs are clear. Heart size and mediastinal contours are within normal limits. Blunting of lateral costophrenic angles suggesting small effusions. Visualized bones unremarkable. IMPRESSION: Possible small pleural effusions.  No overt interstitial edema. Electronically Signed   By: Lucrezia Europe M.D.   On: 01/10/2022 17:33     Assessment and Plan:   1. Atrial Fibrillation with RVR - This has been a recurrent issue for the patient throughout admissions this year and previously occurring in the setting of anemia and osteomyelitis and now in the setting of septic shock. Would recommend updating a limited echo for reassessment of his EF and atrial size once rates improve.  - He has paroxysmal atrial fibrillation as he was in NSR overnight with recurrence this AM. His PTA Toprol-XL was initially held due to hypotension but has been restarted and he is scheduled to receive his regular dose of 85m daily this AM. Discussed with his nurse and she will give this along with his Midodrine now. If rates remain poorly controlled and BP does not allow for further titration of AV nodal blocking agents, will likely need to use Amiodarone. Digoxin is not ideal given his ESRD.   - Continue Eliquis 564mBID for anticoagulation. He does have a chronic anemia in the setting of ESRD. Hgb at 7.8 today.   2. HTN - Was initially hypotensive and required pressor support but this has now been discontinued. He has been restarted on Toprol-XL 5080maily and is on Midodrine 44m56mD (was on this prior to admission but would only take on HD days).   3. ESRD/Volume Overload - He has received IV fluids this admission in the setting of sepsis and is volume overloaded on examination today. Unsure of the accuracy of his weights as listed as 244 lbs yesterday and at 262 lbs today. Volume management per HD. Nephrology following.   4. Septic Shock  in the setting of E.coli UTI and Cellulitis - Now off pressor support and remains on antibiotic therapy with Cipro and Penicillin. Previously had AMS on admission but A&Ox3 at the time of this encounter. Management per the admitting team.    Risk Assessment/Risk Scores:   CHA2DS2-VASc Score = 4   This indicates a 4.8% annual risk of stroke. The patient's score is based upon: CHF History: 0 HTN History: 1 Diabetes History: 1 Stroke History: 0 Vascular Disease History: 1 Age Score: 1 Gender Score: 0   For questions or updates, please contact [email or web address] Please consult [email or web address] for contact info under    Signed, BritFransisco Hertz  Vesna Kable, PA-C  01/13/2022 8:20 AM

## 2022-01-13 NOTE — TOC Initial Note (Signed)
Transition of Care [facility]) - Initial/Assessment Note    Patient Details  Name: Danny [name] MRN: [id number] Date of Birth: 1955-11-12  Transition of Care [facility] CM/SW Contact:    Ihor Gully, LCSW Phone Number: 01/13/2022, 2:08 PM  Clinical Narrative:                 Patient is LTC at [facility]. He has been in SNF for over a year. He is on HD and the plan is for him to return to SNF at d/c. Family and facility are agreeable for patient to return to the facility at d/c.   Expected Discharge Plan: Skilled Nursing Facility Barriers to Discharge: Continued Medical Work up   Patient Goals and CMS Choice Patient states their goals for this hospitalization and ongoing recovery are:: return to SNF      Expected Discharge Plan and Services Expected Discharge Plan: [place]                                              Prior Living Arrangements/Services     Patient language and need for interpreter reviewed:: Yes        Need for Family Participation in Patient Care: Yes (Comment) Care giver support system in place?: Yes (comment)   Criminal Activity/Legal Involvement Pertinent to Current Situation/Hospitalization: No - Comment as needed  Activities of Daily Living      Permission Sought/Granted Permission sought to share information with : Family Supports    Share Information with NAME: sister, viola           Emotional Assessment         Alcohol / Substance Use: Not Applicable Psych Involvement: No (comment)  Admission diagnosis:  Acute encephalopathy [G93.40] Septic shock ([place]) [A41.9, R65.21] Severe sepsis ([place]) [A41.9, R65.20] Acute hypoxemic respiratory failure ([place]) [E75.17] Acute metabolic encephalopathy [G01.74] Patient Active Problem List   Diagnosis Date Noted   Septic shock ([place]) 01/10/2022   Mixed hyperlipidemia 01/10/2022   Hypotension 01/10/2022   Open wound of right foot 01/10/2022   Lactic acidosis  01/10/2022   Flatulence 11/29/2021   Constipation 11/29/2021   Anemia in ESRD (end-stage renal disease) ([place]) 11/28/2021   Atrial fibrillation with rapid ventricular response ([place]) 10/12/2021   Atrial fibrillation, chronic ([place]) 08/12/2021   Type 2 diabetes mellitus ([place]) 08/12/2021   Obesity (BMI 30-39.9) 08/12/2021   Iron deficiency anemia 04/21/2021   Goals of care, counseling/discussion 94/49/6759   Acute metabolic encephalopathy 16/38/4665   ESRD on dialysis ([place])    B12 deficiency 12/06/2020   Osteomyelitis of right ankle ([place])    Cellulitis, leg 10/28/2020   CKD (chronic kidney disease) stage 4, GFR 15-29 ml/min ([place]) 10/28/2020   Moderate protein-calorie malnutrition ([place]) 10/26/2020   Medical non-compliance 10/26/2020   Chronic ulcer of right ankle (HCC)    Venous stasis    Cellulitis 10/25/2020   Normocytic anemia 09/13/2020   Class 2 obesity    Respiratory failure ([place]) 03/23/2020   Acute on chronic anemia 01/16/2020   Benign prostatic hyperplasia with urinary obstruction 01/15/2020   AKI (acute kidney injury) ([place]) 07/08/2019   Hypoxia 07/08/2019   Decubitus ulcers 07/08/2019   Edema of both lower extremities    Anasarca 03/23/2019   Bladder outlet obstruction 03/23/2019   Diabetic ulcer of left foot ([place]) 03/15/2019   Diabetic ulcer of ankle (  [place]) 03/15/2019   Hypertension associated with stage 3 chronic kidney disease due to type 2 diabetes mellitus ([place]) 03/09/2019   Controlled type 2 diabetes mellitus with stage 3 chronic kidney disease, with long-term current use of insulin ([place]) 03/09/2019   Type 2 diabetes with nephropathy ([place]) 03/09/2019   Chronic gout due to renal impairment without tophus 03/09/2019   Emphysematous cystitis 03/05/2019   Bilateral hydronephrosis    Bilateral cellulitis of lower leg 03/03/2019   Klebsiella Cystitis 03/03/2019   Plantar ulcer of left foot ([place]) 03/03/2019   Chronic diastolic CHF (congestive heart failure) ([place])  02/25/2019   Acute respiratory failure with hypoxia ([place]) 02/25/2019   Acute renal failure superimposed on stage 4 chronic kidney disease ([place]) 02/24/2019   Congestive heart failure ([place]) 02/24/2019   Dyspnea 02/23/2019   Essential hypertension 02/23/2019   Diabetes mellitus ([place]) 02/23/2019   CKD (chronic kidney disease) 02/23/2019   Psoriasis 02/23/2019   PCP:  Celene Squibb, MD Pharmacy:   [facility] - [place], [place] - [address] [place] [address] Phone: [phone] Fax: [phone]     Social Determinants of Health (SDOH) Interventions    Readmission Risk Interventions    10/13/2021    9:08 AM 01/27/2021   12:30 PM 01/13/2021    1:30 PM  Readmission Risk Prevention Plan  Transportation Screening Complete Complete Complete  Medication Review (RN Care Manager) Complete Complete Complete  PCP or Specialist appointment within 3-5 days of discharge Complete Complete   HRI or Home Care Consult Complete Complete Complete  SW Recovery Care/Counseling Consult Complete Complete Complete  Palliative Care Screening Not Applicable Not Complete Not Complete  Skilled Nursing Facility Complete Complete Complete

## 2022-01-13 NOTE — Progress Notes (Signed)
PROGRESS NOTE    Patient: Danny [name]                            PCP: Celene Squibb, MD                    DOB: 1955-09-23            DOA: 01/10/2022 GEX:[id number]             DOS: 01/13/2022, 10:37 AM   LOS: 3 days   Date of Service: The patient was seen and examined on 01/13/2022  Subjective:   The patient was seen and examined this morning, awake alert oriented, following commands. Status post hemodialysis, 3 L fluid was extracted yesterday, Post urinalysis patient into A-fib with RVR,... Denied having chest pain  Brief Narrative:   Danny [name] is a 66 y.o. male with medical history significant of hypertension, ESRD (TTS), A-fib on Eliquis who presents to the emergency department via EMS from a nursing facility due to fever and weakness.   Patient states that he went to dialysis this morning, but no fluid was taken off him. Unable to provide further history, history was obtained from the ED physician and ED medical record.  Per report, patient was found to be hypoxic with O2 sat in the 80s, supplemental oxygen via [place] at 4 LPM was provided.  Patient was reported to be weak and confused today (different from baseline, good sense of humor, AOx3), he was reported to be fine during shift signout at the nursing facility.       ED Course:  Tmax of 102.25F, tachypneic, tachycardic.  Work-up in the ED showed normal CBC except for WBC of 10.6 BMP showed sodium 133, potassium 4.2, chloride 96, bicarb 26, glucose 129, BUN 55, creatinine 4.34, calcium 7.7, albumin 2.0, alkaline phosphatase 187, AST 22, ALT 18, total bilirubin 2.2, eGFR 14, lactic acid 2.0, sed rate 100.  Urinalysis was positive for large leukocytes, > 50 WBC, and many bacteria.  Influenza A, B, SARS coronavirus 2 was negative.  Blood culture pending. Chest x-ray showed possible small pleural effusions.  No overt interstitial edema. He was treated with IV cefepime, Flagyl, vancomycin, Zofran was given, IV hydration was provided.   Tylenol was given due to fever.  Patient's BP continued to be in hypotensive range, he was started on peripheral IV Levophed.   Hospitalist was asked to admit patient for further evaluation and management.    Assessment & Plan:   Principal Problem:   Septic shock ([place]) Active Problems:   Cellulitis, leg   ESRD on dialysis ([place])   Acute metabolic encephalopathy   Iron deficiency anemia   Atrial fibrillation, chronic (HCC)   Type 2 diabetes mellitus (HCC)   Obesity (BMI 30-39.9)   Mixed hyperlipidemia   Hypotension   Open wound of right foot   Lactic acidosis   Sepsis-septic shock Bacteremia: Streptococcus, UTI E. Coli (ESBL) -Possible source of infection right leg cellulitis  -Sepsis/septic shock physiology improving -Off Levophed, status post IV fluid resuscitation, IV antibiotic  -Blood cultures are growing group B strep, urine culture growing E. Coli Per infectious disease pharmacy recommendation antibiotics was switched to pen G -Initiating p.o. ciprofloxacin discontinued since urine culture E. coli is ESBL From was consulted for initiation of Imipenem   -Discontinuing broad-spectrum antibiotics of vancomycin, Flagyl, cefepime today 01/12/2022 Met sepsis septic shock criteria on admission-with encephalopathy, acute respiratory failure,  hypotensive fever, tachycardia, tachypnea, leukocytosis -As needed Tylenol,  -Will remain in ICU on amiodarone drip   A-fib with RVR -Yesterday 01/12/2022 post hemodialysis-patient went to A-fib with RVR -10 mg IV Cardizem was given with a as needed IV metoprolol Patient briefly converted back to normal sinus rhythm now once again in A-fib with RVR this morning  -Cardiologist consulted -Initiating patient on amiodarone drip -Increase his metoprolol XL to 50 mg daily -Continuing Eliquis on Eliquis    Acute respiratory failure  -During this hospitalization patient was on as high as 6 L of oxygen, currently has been weaned down to  2 L, satting 93% -Likely was exacerbated by volume overload, and sepsis   acute metabolic encephalopathy -Resolved -- likely it was due to sepsis, septic shock -Continue treating underlying causes    Hypotension -acute on chronic  Worsening hypotension likely due to sepsis -Successfully weaned off Levophed -Continue home medication of midodrine    Right foot wound Continue wound care -[place] lower extremity Doppler>>> No evidence of deep venous thrombosis in either lower extremity.   Lactic acidosis Lactic acid 2.0, >>3.0 >>2.4   continue to trend lactic acid  ESRD on HD (TTS) Patient states that he had dialysis today but no fluid was taken off him Continue [facility] consulted for maintenance dialysis -Hemodialysis today 01/12/2022 -6 L of fluid was extracted      Type 2 diabetes mellitus Continue ISS and hypoglycemia protocol   Iron deficiency anemia Continue ferrous sulfate Continue senna to prevent constipation  Mixed hyperlipidemia Continue statin Continue Lovaza   Obesity (BMI 32.66 kgm) Diet and lifestyle modification  Severe debility -Once stable PT OT will be consulted for evaluation and recommendation ----------------------------------------------------------------------------------------------------------------------------------------------- Nutritional status:  The patient's BMI is: Body mass index is 34.67 kg/m. I agree with the assessment and plan as outline  Skin Assessment: I have examined the patient's skin and I agree with the wound assessment as performed by wound care team As outlined belowe: Please see description and picture    --------------------------------------------------------------------------------------------------------------------------------------------- Cultures; Blood Cultures x 2 >> group B strep Urine Culture  >>> E. Coli (multidrug resistant, ESBL)  Sputum Culture >> does not been  obtained  -------------------------------------------------------------------------------------------------------------------------------------------  DVT prophylaxis:  SCDs Start: 01/10/22 1933 apixaban (ELIQUIS) tablet 5 mg   Code Status:   Code Status: DNR  Family Communication: No family member present at bedside- attempt will be made to update daily The above findings and plan of care has been discussed with patient (and family)  in detail,  they expressed understanding and agreement of above. -Advance care planning has been discussed.   Admission status:   Status is: Inpatient Remains inpatient appropriate because: Needing treatment for sepsis, septic shock, IV fluids, IV antibiotics,     Procedures:   No admission procedures for hospital encounter.   Antimicrobials:  Anti-infectives (From admission, onward)    Start     Dose/Rate Route Frequency Ordered Stop   01/12/22 1600  vancomycin (VANCOCIN) IVPB 1000 mg/200 mL premix  Status:  Discontinued        1,000 mg 200 mL/hr over 60 Minutes Intravenous Every T-Th-Sa (Hemodialysis) 01/11/22 1300 01/12/22 1012   01/12/22 1600  penicillin G potassium 2 Million Units in dextrose 5 % 50 mL IVPB        2 Million Units 100 mL/hr over 30 Minutes Intravenous Every 4 hours 01/12/22 1013     01/12/22 1200  penicillin G potassium 4 Million Units in dextrose 5 % 250 mL  IVPB        4 Million Units 250 mL/hr over 60 Minutes Intravenous  Once 01/12/22 1012 01/12/22 1304   01/12/22 1130  ciprofloxacin (CIPRO) tablet 500 mg       Note to Pharmacy: UTI - E-coli   500 mg Oral Every 24 hours 01/12/22 1118     01/11/22 2200  ceFEPIme (MAXIPIME) 1 g in sodium chloride 0.9 % 100 mL IVPB  Status:  Discontinued        1 g 200 mL/hr over 30 Minutes Intravenous Every 24 hours 01/10/22 1815 01/12/22 1012   01/11/22 0715  metroNIDAZOLE (FLAGYL) IVPB 500 mg  Status:  Discontinued        500 mg 100 mL/hr over 60 Minutes Intravenous Every 12 hours  01/11/22 0714 01/12/22 1012   01/10/22 1730  ceFEPIme (MAXIPIME) 2 g in sodium chloride 0.9 % 100 mL IVPB        2 g 200 mL/hr over 30 Minutes Intravenous  Once 01/10/22 1719 01/10/22 1804   01/10/22 1730  vancomycin (VANCOREADY) IVPB 2000 mg/400 mL        2,000 mg 200 mL/hr over 120 Minutes Intravenous  Once 01/10/22 1720 01/10/22 2132   01/10/22 1715  aztreonam (AZACTAM) 2 g in sodium chloride 0.9 % 100 mL IVPB  Status:  Discontinued        2 g 200 mL/hr over 30 Minutes Intravenous  Once 01/10/22 1713 01/10/22 1719   01/10/22 1715  metroNIDAZOLE (FLAGYL) IVPB 500 mg        500 mg 100 mL/hr over 60 Minutes Intravenous  Once 01/10/22 1713 01/10/22 1912   01/10/22 1715  vancomycin (VANCOCIN) IVPB 1000 mg/200 mL premix  Status:  Discontinued        1,000 mg 200 mL/hr over 60 Minutes Intravenous  Once 01/10/22 1713 01/10/22 1720        Medication:   acidophilus  1 capsule Oral TID WC   apixaban  5 mg Oral BID   Chlorhexidine Gluconate Cloth  6 each Topical Q0600   Chlorhexidine Gluconate Cloth  6 each Topical Q0600   ciprofloxacin  500 mg Oral Q24H   darbepoetin (ARANESP) injection - NON-DIALYSIS  100 mcg Subcutaneous Q Thu-1800   ferrous sulfate  325 mg Oral Daily   insulin aspart  0-6 Units Subcutaneous TID WC   insulin detemir  5 Units Subcutaneous Daily   metoprolol succinate  50 mg Oral Daily   midodrine  10 mg Oral BID   mupirocin ointment   Nasal BID   omega-3 acid ethyl esters  1 g Oral Daily   pantoprazole  40 mg Oral Daily   rosuvastatin  5 mg Oral QPM   senna  2 tablet Oral BID   sevelamer carbonate  800 mg Oral BID WC    acetaminophen **OR** acetaminophen, levalbuterol, metoprolol tartrate, ondansetron **OR** ondansetron (ZOFRAN) IV, pentafluoroprop-tetrafluoroeth, sodium chloride   Objective:   Vitals:   01/13/22 0700 01/13/22 0730 01/13/22 0800 01/13/22 0812  BP: (!) 106/46 (!) 116/50 110/72   Pulse: (!) 115 (!) 38 (!) 124 (!) 127  Resp: (!) 21  20    Temp:    98.9 F (37.2 C)  TempSrc:      SpO2: 99% 95% 95%   Weight:      Height:        Intake/Output Summary (Last 24 hours) at 01/13/2022 1037 Last data filed at 01/12/2022 1600 Gross per 24 hour  Intake 1634.71 ml  Output  3 ml  Net 1631.71 ml    Filed Weights   01/12/22 0500 01/12/22 0904 01/13/22 0500  Weight: 111.1 kg 111.1 kg 119.2 kg     Examination:      Physical Exam:   General:  AAO x 3,  cooperative, no distress;   HEENT:  Normocephalic, PERRL, otherwise with in Normal limits   Neuro:  CNII-XII intact. , normal motor and sensation, reflexes intact   Lungs:   Clear to auscultation BL, Respirations unlabored,  No wheezes / crackles  Cardio:    S1/S2, irregularly irregular,  no murmure, No Rubs or Gallops   Abdomen:  Soft, non-tender, bowel sounds active all four quadrants, no guarding or peritoneal signs.  Muscular  skeletal:  Limited exam -global generalized weaknesses - in bed, able to move all 4 extremities,   2+ pulses,  symmetric, No pitting edema  Skin:  Dry, warm to touch, negative for any Rashes,  Wounds: Please see nursing documentation         Wounds: Please see nursing documentation-extremity wounds erythema, open wound l right ateral ankle                        ------------------------------------------------------------------------------------------------------------------------------------------    LABs:     Latest Ref Rng & Units 01/13/2022    3:53 AM 01/12/2022    9:40 AM 01/11/2022    7:49 AM  CBC  WBC 4.0 - 10.5 K/uL 10.2  11.9  19.1   Hemoglobin 13.0 - 17.0 g/dL 7.8  7.8  9.5   Hematocrit 39.0 - 52.0 % 24.3  23.8  30.2   Platelets 150 - 400 K/uL 229  243  280       Latest Ref Rng & Units 01/13/2022    3:53 AM 01/12/2022    9:40 AM 01/11/2022    7:49 AM  CMP  Glucose 70 - 99 mg/dL 165  163  204   BUN 8 - 23 mg/dL 45  70  58   Creatinine 0.61 - 1.24 mg/dL 3.61  5.07  4.49   Sodium 135 - 145 mmol/L 131  131  131    Potassium 3.5 - 5.1 mmol/L 4.2  4.7  4.7   Chloride 98 - 111 mmol/L 98  99  99   CO2 22 - 32 mmol/L 26  22  20    Calcium 8.9 - 10.3 mg/dL 7.4  7.5  7.5   Total Protein 6.5 - 8.1 g/dL 5.6  5.9  6.7   Total Bilirubin 0.3 - 1.2 mg/dL 1.9  1.7  2.5   Alkaline Phos 38 - 126 U/L 180  136  168   AST 15 - 41 U/L 37  26  21   ALT 0 - 44 U/L 23  19  19         Micro Results Recent Results (from the past 240 hour(s))  Resp Panel by RT-PCR (Flu A&B, Covid) Anterior Nasal Swab     Status: None   Collection Time: 01/10/22  5:20 PM   Specimen: Anterior Nasal Swab  Result Value Ref Range Status   SARS Coronavirus 2 by RT PCR NEGATIVE NEGATIVE Final    Comment: (NOTE) SARS-CoV-2 target nucleic acids are NOT DETECTED.  The SARS-CoV-2 RNA is generally detectable in upper respiratory specimens during the acute phase of infection. The lowest concentration of SARS-CoV-2 viral copies this assay can detect is 138 copies/mL. A negative result does not preclude SARS-Cov-2 infection and should not  be used as the sole basis for treatment or other patient management decisions. A negative result may occur with  improper specimen collection/handling, submission of specimen other than nasopharyngeal swab, presence of viral mutation(s) within the areas targeted by this assay, and inadequate number of viral copies(<138 copies/mL). A negative result must be combined with clinical observations, patient history, and epidemiological information. The expected result is Negative.  Fact Sheet for Patients:  EntrepreneurPulse.com.au  Fact Sheet for Healthcare Providers:  IncredibleEmployment.be  This test is no t yet approved or cleared by the [place] FDA and  has been authorized for detection and/or diagnosis of SARS-CoV-2 by FDA under an Emergency Use Authorization (EUA). This EUA will remain  in effect (meaning this test can be used) for the duration of the COVID-19  declaration under Section 564(b)(1) of the Act, 21 U.S.C.section 360bbb-3(b)(1), unless the authorization is terminated  or revoked sooner.       Influenza A by PCR NEGATIVE NEGATIVE Final   Influenza B by PCR NEGATIVE NEGATIVE Final    Comment: (NOTE) The Xpert Xpress SARS-CoV-2/FLU/RSV plus assay is intended as an aid in the diagnosis of influenza from Nasopharyngeal swab specimens and should not be used as a sole basis for treatment. Nasal washings and aspirates are unacceptable for Xpert Xpress SARS-CoV-2/FLU/RSV testing.  Fact Sheet for Patients: EntrepreneurPulse.com.au  Fact Sheet for Healthcare Providers: IncredibleEmployment.be  This test is not yet approved or cleared by the [place] FDA and has been authorized for detection and/or diagnosis of SARS-CoV-2 by FDA under an Emergency Use Authorization (EUA). This EUA will remain in effect (meaning this test can be used) for the duration of the COVID-19 declaration under Section 564(b)(1) of the Act, 21 U.S.C. section 360bbb-3(b)(1), unless the authorization is terminated or revoked.  Performed at [facility], [address] [place], [place] [address]   Blood Culture (routine x 2)     Status: Abnormal   Collection Time: 01/10/22  5:20 PM   Specimen: BLOOD RIGHT FOREARM  Result Value Ref Range Status   Specimen Description   Final    BLOOD RIGHT FOREARM Performed at [facility], [address], [place], [place] [address]    Special Requests   Final    BOTTLES DRAWN AEROBIC AND ANAEROBIC Blood Culture results may not be optimal due to an inadequate volume of blood received in culture bottles Performed at [facility], [address], [place], [place] [address]    Culture  Setup Time   Final    GRAM POSITIVE COCCI BOTTLES DRAWN AEROBIC AND ANAEROBIC Gram Stain Report Called to,Read Back By and Verified With: FERRAINLO,[email or web address]  BT MATTHEWS,B 7.19.2023 Performed at [facility], [address], [place], [place] [address]    Culture (A)  Final    GROUP B STREP(S.AGALACTIAE)ISOLATED SUSCEPTIBILITIES PERFORMED ON PREVIOUS CULTURE WITHIN THE LAST 5 DAYS. Performed at [facility], [place] [address] [place], [place] [address]    Report Status 01/13/2022 FINAL  Final  Blood Culture (routine x 2)     Status: Abnormal   Collection Time: 01/10/22  5:20 PM   Specimen: BLOOD  Result Value Ref Range Status   Specimen Description   Final    BLOOD RIGHT ANTECUBITAL Performed at [facility], [place] [address] [place], [place] [address]    Special Requests   Final    BOTTLES DRAWN AEROBIC AND ANAEROBIC Blood Culture adequate volume Performed at [facility], [address] [place],  [address]  Culture  Setup Time   Final    GRAM POSITIVE COCCI BOTTLES DRAWN AEROBIC AND ANAEROBIC Gram Stain Report Called to,Read Back By and Verified With: FERRAINLO,[email or web address]  BY MAATHEWS,B 7.19.2023 Organism ID to follow CRITICAL RESULT CALLED TO, READ BACK BY AND VERIFIED WITH: Chucky May [place], AT [phone] D. VANHOOK Performed at [facility], [place] [address], [place], [place] [address]    Culture GROUP B STREP(S.AGALACTIAE)ISOLATED (A)  Final   Report Status 01/13/2022 FINAL  Final   Organism ID, Bacteria GROUP B STREP(S.AGALACTIAE)ISOLATED  Final      Susceptibility   Group b strep(s.agalactiae)isolated - MIC*    CLINDAMYCIN <=0.25 SENSITIVE Sensitive     AMPICILLIN <=0.25 SENSITIVE Sensitive     ERYTHROMYCIN 0.5 INTERMEDIATE Intermediate     VANCOMYCIN 0.5 SENSITIVE Sensitive     CEFTRIAXONE <=0.12 SENSITIVE Sensitive     LEVOFLOXACIN 0.5 SENSITIVE Sensitive     PENICILLIN Value in next row Sensitive      SENSITIVE0.12    * GROUP B STREP(S.AGALACTIAE)ISOLATED  Blood Culture ID Panel (Reflexed)     Status: Abnormal   Collection Time: 01/10/22  5:20 PM  Result Value Ref Range Status   Enterococcus faecalis NOT DETECTED NOT DETECTED Final   Enterococcus  Faecium NOT DETECTED NOT DETECTED Final   Listeria monocytogenes NOT DETECTED NOT DETECTED Final   Staphylococcus species NOT DETECTED NOT DETECTED Final   Staphylococcus aureus (BCID) NOT DETECTED NOT DETECTED Final   Staphylococcus epidermidis NOT DETECTED NOT DETECTED Final   Staphylococcus lugdunensis NOT DETECTED NOT DETECTED Final   Streptococcus species DETECTED (A) NOT DETECTED Final    Comment: CRITICAL RESULT CALLED TO, READ BACK BY AND VERIFIED WITH: G. COFFEE PHARMD, AT [phone] D. VANHOOK    Streptococcus agalactiae DETECTED (A) NOT DETECTED Final    Comment: CRITICAL RESULT CALLED TO, READ BACK BY AND VERIFIED WITH: G. COFFEE PHARMD, AT [phone] D. VANHOOK    Streptococcus pneumoniae NOT DETECTED NOT DETECTED Final   Streptococcus pyogenes NOT DETECTED NOT DETECTED Final   A.calcoaceticus-baumannii NOT DETECTED NOT DETECTED Final   Bacteroides fragilis NOT DETECTED NOT DETECTED Final   Enterobacterales NOT DETECTED NOT DETECTED Final   Enterobacter cloacae complex NOT DETECTED NOT DETECTED Final   Escherichia coli NOT DETECTED NOT DETECTED Final   Klebsiella aerogenes NOT DETECTED NOT DETECTED Final   Klebsiella oxytoca NOT DETECTED NOT DETECTED Final   Klebsiella pneumoniae NOT DETECTED NOT DETECTED Final   Proteus species NOT DETECTED NOT DETECTED Final   Salmonella species NOT DETECTED NOT DETECTED Final   Serratia marcescens NOT DETECTED NOT DETECTED Final   Haemophilus influenzae NOT DETECTED NOT DETECTED Final   Neisseria meningitidis NOT DETECTED NOT DETECTED Final   Pseudomonas aeruginosa NOT DETECTED NOT DETECTED Final   Stenotrophomonas maltophilia NOT DETECTED NOT DETECTED Final   Candida albicans NOT DETECTED NOT DETECTED Final   Candida auris NOT DETECTED NOT DETECTED Final   Candida glabrata NOT DETECTED NOT DETECTED Final   Candida krusei NOT DETECTED NOT DETECTED Final   Candida parapsilosis NOT DETECTED NOT DETECTED Final   Candida tropicalis NOT  DETECTED NOT DETECTED Final   Cryptococcus neoformans/gattii NOT DETECTED NOT DETECTED Final    Comment: Performed at [facility], [address] [place], [place] [address]  Urine Culture     Status: Abnormal   Collection Time: 01/10/22  5:48 PM   Specimen: Urine, Clean Catch  Result Value Ref Range Status   Specimen Description  Final    URINE, CLEAN CATCH Performed at [facility], [address] [place], [place] [address]    Special Requests   Final    NONE Performed at [facility], [address] [place], [place] [address]    Culture (A)  Final    >=100,000 COLONIES/mL ESCHERICHIA COLI Confirmed Extended Spectrum Beta-Lactamase Producer (ESBL).  In bloodstream infections from ESBL organisms, carbapenems are preferred over piperacillin/tazobactam. They are shown to have a lower risk of mortality.    Report Status 01/13/2022 FINAL  Final   Organism ID, Bacteria ESCHERICHIA COLI (A)  Final      Susceptibility   Escherichia coli - MIC*    AMPICILLIN >=32 RESISTANT Resistant     CEFAZOLIN >=64 RESISTANT Resistant     CEFEPIME >=32 RESISTANT Resistant     CEFTRIAXONE >=64 RESISTANT Resistant     CIPROFLOXACIN >=4 RESISTANT Resistant     GENTAMICIN >=16 RESISTANT Resistant     IMIPENEM <=0.25 SENSITIVE Sensitive     NITROFURANTOIN 128 RESISTANT Resistant     TRIMETH/SULFA >=320 RESISTANT Resistant     AMPICILLIN/SULBACTAM >=32 RESISTANT Resistant     PIP/TAZO 16 SENSITIVE Sensitive     * >=100,000 COLONIES/mL ESCHERICHIA COLI  Culture, blood (x 2)     Status: None (Preliminary result)   Collection Time: 01/11/22  7:48 AM   Specimen: BLOOD  Result Value Ref Range Status   Specimen Description   Final    BLOOD BLOOD RIGHT HAND Performed at [facility], [address], [place], [place] [address]    Special Requests   Final    BOTTLES DRAWN AEROBIC AND ANAEROBIC Blood Culture results may not be optimal due to an inadequate volume of blood  received in culture bottles Performed at [facility], [address], [place], [place] [address]    Culture   Final    NO GROWTH 2 DAYS Performed at [facility], [address], [place], [place] [address]    Report Status PENDING  Incomplete  Culture, blood (x 2)     Status: None (Preliminary result)   Collection Time: 01/11/22  7:58 AM   Specimen: BLOOD  Result Value Ref Range Status   Specimen Description   Final    BLOOD BLOOD RIGHT HAND Performed at [facility], [place] [address], [place], [place] [address]    Special Requests   Final    BOTTLES DRAWN AEROBIC AND ANAEROBIC Blood Culture results may not be optimal due to an inadequate volume of blood received in culture bottles Performed at [facility], [address] [place], [place] [address]    Culture   Final    NO GROWTH 2 DAYS Performed at [facility], [address] [place],  [address]    Report Status PENDING  Incomplete  MRSA Next Gen by PCR, Nasal     Status: Abnormal   Collection Time: 01/11/22 11:02 AM   Specimen: Nasal Mucosa; Nasal Swab  Result Value Ref Range Status   MRSA by PCR Next Gen DETECTED (A) NOT DETECTED Final    Comment: RESULT CALLED TO, READ BACK BY AND VERIFIED WITH: DANNY GREBER @ [id number] ON 01/11/22 C VARNER (NOTE) The GeneXpert MRSA Assay (FDA approved for NASAL specimens only), is one component of a comprehensive MRSA colonization surveillance program. It is not intended to diagnose MRSA infection nor to guide or monitor treatment for MRSA infections. Test performance is not FDA  approved in patients less than 65 years old. Performed at [facility], [address] [place], [place] [address]   Culture, blood (Routine X 2) w Reflex to ID Panel     Status: None (Preliminary result)   Collection Time: 01/12/22  1:53 AM   Specimen: BLOOD RIGHT FOREARM  Result Value Ref Range Status   Specimen Description BLOOD  RIGHT FOREARM  Final   Special Requests   Final    BOTTLES DRAWN AEROBIC ONLY Blood Culture adequate volume   Culture   Final    NO GROWTH 1 DAY Performed at [facility], [address] [place], [place] [address]    Report Status PENDING  Incomplete  Culture, blood (Routine X 2) w Reflex to ID Panel     Status: None (Preliminary result)   Collection Time: 01/12/22  1:53 AM   Specimen: Right Antecubital; Blood  Result Value Ref Range Status   Specimen Description RIGHT ANTECUBITAL  Final   Special Requests   Final    BOTTLES DRAWN AEROBIC AND ANAEROBIC Blood Culture results may not be optimal due to an excessive volume of blood received in culture bottles   Culture   Final    NO GROWTH 1 DAY Performed at [facility], [address] [place],  [address]    Report Status PENDING  Incomplete    Radiology Reports No results found.  SIGNED: Deatra James, MD, FHM. > 66 min of critical time was spent seeing evaluating patient, stabilizing patient, discussing plan of care with ICU staff and consultants.  Plan of care and managing hypertension, sepsis, septic shock   [facility],  Pager (please use [email or web address] to page/text) Please use Epic Secure Chat for non-urgent communication (7AM-7PM)  If 7PM-7AM, please contact night-coverage [email or web address], 01/13/2022, 10:37 AM

## 2022-01-13 NOTE — Consult Note (Signed)
Virtual Visit via Video Note  I connected with Danny [name] on today, January 14 1011 at 1120  by a video enabled telemedicine application and verified that I am speaking with the correct person using two identifiers.  Location: Patient: Danny [name] ICU Bed 7 Provider: [facility]   I discussed the limitations of evaluation and management by telemedicine and the availability of in person appointments. The patient expressed understanding and agreed to proceed.        INFECTIOUS DISEASE ATTENDING ADDENDUM:     Alcide Evener 01/13/2022, 11:40 AM  Date of Admission:  01/10/2022           Reason for Consult: Group B streptococcus bacteremia septic shock requiring vasopressors   Referring Provider: Skipper Cliche, MD   Assessment:  Group B streptococcus bacteremia with septic shock likely due to Cellulitis of the toe I suspect it certainly also could be due to his untreated bilateral malleolar osteomyelitis History of total left knee replacement End-stage renal disease on hemodialysis via fistula in left upper extremity Asymptomatic bacteriuria with ESBL colonizing urine Paroxysmal atrial fibrillation  Plan:  Continue penicillin Disregard ESBL in urine from a treatment standpoint though he should be on contact precautions for ESBL I ordered plain films of his ankles bilaterally When he is stable 1 could get MRI without contrast of both ankles 1 could then also consider orthopedic surgical consultation though in the past he has refused discussions of surgery to these ankles--he would require amputations below the knee bilaterally though his left side is complicated by the fact that he has a prosthetic joint  Dr. Linus Salmons is on call this weekend we will follow-up the studies, cultures and be available for discussion of this case.  I will be back on Monday.    Principal Problem:   Septic shock ([place]) Active Problems:   Cellulitis, leg   ESRD on dialysis ([place])    Acute metabolic encephalopathy   Iron deficiency anemia   Atrial fibrillation, chronic (HCC)   Type 2 diabetes mellitus (HCC)   Obesity (BMI 30-39.9)   Mixed hyperlipidemia   Hypotension   Open wound of right foot   Lactic acidosis   Scheduled Meds:  acidophilus  1 capsule Oral TID WC   apixaban  5 mg Oral BID   Chlorhexidine Gluconate Cloth  6 each Topical Q0600   Chlorhexidine Gluconate Cloth  6 each Topical Q0600   darbepoetin (ARANESP) injection - NON-DIALYSIS  100 mcg Subcutaneous Q Thu-1800   ferrous sulfate  325 mg Oral Daily   insulin aspart  0-6 Units Subcutaneous TID WC   insulin detemir  5 Units Subcutaneous Daily   metoprolol succinate  50 mg Oral Daily   midodrine  10 mg Oral BID   mupirocin ointment   Nasal BID   omega-3 acid ethyl esters  1 g Oral Daily   pantoprazole  40 mg Oral Daily   rosuvastatin  5 mg Oral QPM   senna  2 tablet Oral BID   sevelamer carbonate  800 mg Oral BID WC   Continuous Infusions:  sodium chloride Stopped (01/11/22 0915)   amiodarone 60 mg/hr (01/13/22 0913)   Followed by   amiodarone     pencillin G potassium IV 2 Million Units (01/13/22 1139)   phenylephrine (NEO-SYNEPHRINE) Adult infusion Stopped (01/12/22 0415)   PRN Meds:.acetaminophen **OR** acetaminophen, levalbuterol, metoprolol tartrate, ondansetron **OR** ondansetron (ZOFRAN) IV, pentafluoroprop-tetrafluoroeth, sodium chloride  HPI: Danny [name] is a 66 y.o. male with  multiple medical problems including hypertension end-stage renal disease on hemodialysis via AV fistula history of degenerative joint disease with left knee total arthroplasty performed at [facility], who has had bilateral ulcers over his malleolar regions for many months and was found on MRI back in April of this year to have early osteomyelitis of both sides.  The hospitalist taking care of him Dr. Avon Gully discussed this with the patient and he was unwilling to discuss the idea of surgeries in  particular amputations which he would need to affect cure given the location of these bone infections.  He was admitted on 18 July after having been found to be febrile confused and weak at his skilled nursing facility.  He was brought to the ER was found to be hypoxic and hypotensive.  He was transferred to the intensive care unit and resuscitated with fluids.  Blood cultures were taken urine was analyzed and cultured as well.  In the interim his blood cultures have come back positive for group B streptococcus.  He was narrowed to Unasyn.  He has been weaned off vasopressors.  The urine culture is yielded an ESBL but he has no symptoms concerning for urinary tract infection and is largely an uric.  He has erythema in his lower extremities bilaterally right worse than left which I was able to visualize through the video feed though it is not terribly overwhelming for cellulitis.  Certainly the soft tissue could be source of infection but he also could have his known osteomyelitis as a source of infection these areas were not themselves draining pus although the right side was apparently quite bloody earlier today and was the reason why the bandage was not exchanged when I was interviewing him over the video feed.  We recommend continued penicillin.  I will repeat blood cultures to ensure they are clearing.  I am ordering plain films of his bilateral ankles to assess the osteomyelitis in these areas and when he is stable from a hemodynamic standpoint would consider MRI without contrast of both ankles.  I understand that in the past he was unwilling to discuss the idea of surgery to these areas including what he would need which would likely be a below the knee amputation bilaterally though I do not know if it might not need to be higher up on the left side where he has a total knee arthroplasty.  Certainly with unknown cured osteomyelitis he will be at risk for further spread of the bone  infection to proximal bones and potential recurrent bloodstream infections as he is experience which could certainly seeded his prosthetic knee or his fistula.  I spent 90 minutes with the patient including than 50% of the time in face to face counseling of the patient the nature of bloodstream infections soft tissue infections potential bone infections, personally reviewing MAR I of bilateral ankles performed in April 2023 with updated cultures CBC CMP along with review of medical records in preparation for the visit and during the visit and in coordination of his care.    Review of Systems: Review of Systems  Constitutional:  Negative for chills, fever, malaise/fatigue and weight loss.  HENT:  Negative for congestion and sore throat.   Eyes:  Negative for blurred vision and photophobia.  Respiratory:  Negative for cough, shortness of breath and wheezing.   Cardiovascular:  Negative for chest pain, palpitations and leg swelling.  Gastrointestinal:  Negative for abdominal pain, blood in stool, constipation, diarrhea, heartburn, melena, nausea and  vomiting.  Genitourinary:  Negative for dysuria, flank pain and hematuria.  Musculoskeletal:  Positive for myalgias. Negative for back pain, falls and joint pain.  Skin:  Negative for itching and rash.  Neurological:  Positive for dizziness and weakness. Negative for focal weakness, loss of consciousness and headaches.  Endo/Heme/Allergies:  Does not bruise/bleed easily.  Psychiatric/Behavioral:  Negative for depression and suicidal ideas. The patient does not have insomnia.     Past Medical History:  Diagnosis Date   Anemia    Anemia in ESRD (end-stage renal disease) ([place]) 11/28/2021   Ankle ulcer (HCC)    Arthritis    CKD (chronic kidney disease), stage V ([place])    Diabetes mellitus without complication ([place])    Diastolic congestive heart failure ([place])    Foot ulcer ([place])    Gout    Hypertension    Urinary retention     Social History    Tobacco Use   Smoking status: Former    Packs/day: 1.50    Years: 10.00    Total pack years: 15.00    Types: Cigarettes    Quit date: 06/02/1987    Years since quitting: 34.6    Passive exposure: Past   Smokeless tobacco: Never  Vaping Use   Vaping Use: Never used  Substance Use Topics   Alcohol use: Not Currently   Drug use: Never    Family History  Problem Relation Age of Onset   Diabetes Mother    Heart attack Mother    Heart attack Father    Diabetes Brother    Allergies  Allergen Reactions   Dust Mite Extract Itching and Other (See Comments)    Unknown reaction-potential shortness of breath   Prednisone Nausea And Vomiting   Rocephin [Ceftriaxone] Nausea And Vomiting    OBJECTIVE: Blood pressure 110/72, pulse (!) 127, temperature 98.9 F (37.2 C), resp. rate 20, height '6\' 1"'$  (1.854 m), weight 119.2 kg, SpO2 95 %.  Physical Exam Constitutional:      Appearance: He is well-developed. He is ill-appearing.  HENT:     Head: Normocephalic and atraumatic.  Eyes:     Conjunctiva/sclera: Conjunctivae normal.  Cardiovascular:     Rate and Rhythm: Normal rate and regular rhythm.  Pulmonary:     Effort: Pulmonary effort is normal. No respiratory distress.     Breath sounds: No wheezing.  Abdominal:     General: There is no distension.     Palpations: Abdomen is soft.  Musculoskeletal:        General: No tenderness. Normal range of motion.     Cervical back: Normal range of motion and neck supple.  Skin:    General: Skin is warm and dry.     Coloration: Skin is not pale.     Findings: Erythema present. No rash.  Neurological:     General: No focal deficit present.     Mental Status: He is alert and oriented to person, place, and time.  Psychiatric:        Attention and Perception: Attention normal.        Mood and Affect: Mood normal.        Speech: Speech is delayed.        Behavior: Behavior normal. Behavior is cooperative.        Thought Content:  Thought content normal.    Some bilateral lower extremity erythema right worse than left he has bilateral ulcers over his malleolar areas that overlie the areas of osteomyelitis previously  seen on MRI and I have looked at the pictures in epic  Lab Results Lab Results  Component Value Date   WBC 10.2 01/13/2022   HGB 7.8 (L) 01/13/2022   HCT 24.3 (L) 01/13/2022   MCV 82.9 01/13/2022   PLT 229 01/13/2022    Lab Results  Component Value Date   CREATININE 3.61 (H) 01/13/2022   BUN 45 (H) 01/13/2022   NA 131 (L) 01/13/2022   K 4.2 01/13/2022   CL 98 01/13/2022   CO2 26 01/13/2022    Lab Results  Component Value Date   ALT 23 01/13/2022   AST 37 01/13/2022   ALKPHOS 180 (H) 01/13/2022   BILITOT 1.9 (H) 01/13/2022     Microbiology: Recent Results (from the past 240 hour(s))  Resp Panel by RT-PCR (Flu A&B, Covid) Anterior Nasal Swab     Status: None   Collection Time: 01/10/22  5:20 PM   Specimen: Anterior Nasal Swab  Result Value Ref Range Status   SARS Coronavirus 2 by RT PCR NEGATIVE NEGATIVE Final    Comment: (NOTE) SARS-CoV-2 target nucleic acids are NOT DETECTED.  The SARS-CoV-2 RNA is generally detectable in upper respiratory specimens during the acute phase of infection. The lowest concentration of SARS-CoV-2 viral copies this assay can detect is 138 copies/mL. A negative result does not preclude SARS-Cov-2 infection and should not be used as the sole basis for treatment or other patient management decisions. A negative result may occur with  improper specimen collection/handling, submission of specimen other than nasopharyngeal swab, presence of viral mutation(s) within the areas targeted by this assay, and inadequate number of viral copies(<138 copies/mL). A negative result must be combined with clinical observations, patient history, and epidemiological information. The expected result is Negative.  Fact Sheet for Patients:   EntrepreneurPulse.com.au  Fact Sheet for Healthcare Providers:  IncredibleEmployment.be  This test is no t yet approved or cleared by the [place] FDA and  has been authorized for detection and/or diagnosis of SARS-CoV-2 by FDA under an Emergency Use Authorization (EUA). This EUA will remain  in effect (meaning this test can be used) for the duration of the COVID-19 declaration under Section 564(b)(1) of the Act, 21 U.S.C.section 360bbb-3(b)(1), unless the authorization is terminated  or revoked sooner.       Influenza A by PCR NEGATIVE NEGATIVE Final   Influenza B by PCR NEGATIVE NEGATIVE Final    Comment: (NOTE) The Xpert Xpress SARS-CoV-2/FLU/RSV plus assay is intended as an aid in the diagnosis of influenza from Nasopharyngeal swab specimens and should not be used as a sole basis for treatment. Nasal washings and aspirates are unacceptable for Xpert Xpress SARS-CoV-2/FLU/RSV testing.  Fact Sheet for Patients: EntrepreneurPulse.com.au  Fact Sheet for Healthcare Providers: IncredibleEmployment.be  This test is not yet approved or cleared by the [place] FDA and has been authorized for detection and/or diagnosis of SARS-CoV-2 by FDA under an Emergency Use Authorization (EUA). This EUA will remain in effect (meaning this test can be used) for the duration of the COVID-19 declaration under Section 564(b)(1) of the Act, 21 U.S.C. section 360bbb-3(b)(1), unless the authorization is terminated or revoked.  Performed at [facility], [address], [place], [place] [address]   Blood Culture (routine x 2)     Status: Abnormal   Collection Time: 01/10/22  5:20 PM   Specimen: BLOOD RIGHT FOREARM  Result Value Ref Range Status   Specimen Description   Final    BLOOD RIGHT FOREARM Performed at  [facility], [address], [place], [place] [address]    Special Requests   Final    BOTTLES DRAWN  AEROBIC AND ANAEROBIC Blood Culture results may not be optimal due to an inadequate volume of blood received in culture bottles Performed at [facility], [address] [place], [place] [address]    Culture  Setup Time   Final    GRAM POSITIVE COCCI BOTTLES DRAWN AEROBIC AND ANAEROBIC Gram Stain Report Called to,Read Back By and Verified With: FERRAINLO,J'@0322'$  BT MATTHEWS,B 7.19.2023 Performed at [facility], [address], [place], [place] [address]    Culture (A)  Final    GROUP B STREP(S.AGALACTIAE)ISOLATED SUSCEPTIBILITIES PERFORMED ON PREVIOUS CULTURE WITHIN THE LAST 5 DAYS. Performed at [facility], [place] [address] [place], [place] [address]    Report Status 01/13/2022 FINAL  Final  Blood Culture (routine x 2)     Status: Abnormal   Collection Time: 01/10/22  5:20 PM   Specimen: BLOOD  Result Value Ref Range Status   Specimen Description   Final    BLOOD RIGHT ANTECUBITAL Performed at [facility], [place] [address] [place], [place] [address]    Special Requests   Final    BOTTLES DRAWN AEROBIC AND ANAEROBIC Blood Culture adequate volume Performed at [facility], [address] [place], [place] [address]    Culture  Setup Time   Final    GRAM POSITIVE COCCI BOTTLES DRAWN AEROBIC AND ANAEROBIC Gram Stain Report Called to,Read Back By and Verified With: FERRAINLO,J'@0322'$  BY MAATHEWS,B 7.19.2023 Organism ID to follow CRITICAL RESULT CALLED TO, READ BACK BY AND VERIFIED WITH: Chucky May PHARMD, AT [place] [facility] Performed at [facility], [place] [address], [place],  [address]    Culture GROUP B STREP(S.AGALACTIAE)ISOLATED (A)  Final   Report Status 01/13/2022 FINAL  Final   Organism ID, Bacteria GROUP B STREP(S.AGALACTIAE)ISOLATED  Final      Susceptibility   Group b strep(s.agalactiae)isolated - MIC*    CLINDAMYCIN <=0.25 SENSITIVE Sensitive     AMPICILLIN <=0.25 SENSITIVE Sensitive     ERYTHROMYCIN 0.5 INTERMEDIATE Intermediate     VANCOMYCIN  0.5 SENSITIVE Sensitive     CEFTRIAXONE <=0.12 SENSITIVE Sensitive     LEVOFLOXACIN 0.5 SENSITIVE Sensitive     PENICILLIN Value in next row Sensitive      SENSITIVE0.12    * GROUP B STREP(S.AGALACTIAE)ISOLATED  Blood Culture ID Panel (Reflexed)     Status: Abnormal   Collection Time: 01/10/22  5:20 PM  Result Value Ref Range Status   Enterococcus faecalis NOT DETECTED NOT DETECTED Final   Enterococcus Faecium NOT DETECTED NOT DETECTED Final   Listeria monocytogenes NOT DETECTED NOT DETECTED Final   Staphylococcus species NOT DETECTED NOT DETECTED Final   Staphylococcus aureus (BCID) NOT DETECTED NOT DETECTED Final   Staphylococcus epidermidis NOT DETECTED NOT DETECTED Final   Staphylococcus lugdunensis NOT DETECTED NOT DETECTED Final   Streptococcus species DETECTED (A) NOT DETECTED Final    Comment: CRITICAL RESULT CALLED TO, READ BACK BY AND VERIFIED WITH: G. COFFEE PHARMD, AT [phone] D. VANHOOK    Streptococcus agalactiae DETECTED (A) NOT DETECTED Final    Comment: CRITICAL RESULT CALLED TO, READ BACK BY AND VERIFIED WITH: G. COFFEE PHARMD, AT [phone] D. VANHOOK    Streptococcus pneumoniae NOT DETECTED NOT DETECTED Final   Streptococcus pyogenes NOT DETECTED NOT DETECTED Final   A.calcoaceticus-baumannii NOT DETECTED NOT DETECTED Final   Bacteroides fragilis NOT  DETECTED NOT DETECTED Final   Enterobacterales NOT DETECTED NOT DETECTED Final   Enterobacter cloacae complex NOT DETECTED NOT DETECTED Final   Escherichia coli NOT DETECTED NOT DETECTED Final   Klebsiella aerogenes NOT DETECTED NOT DETECTED Final   Klebsiella oxytoca NOT DETECTED NOT DETECTED Final   Klebsiella pneumoniae NOT DETECTED NOT DETECTED Final   Proteus species NOT DETECTED NOT DETECTED Final   Salmonella species NOT DETECTED NOT DETECTED Final   Serratia marcescens NOT DETECTED NOT DETECTED Final   Haemophilus influenzae NOT DETECTED NOT DETECTED Final   Neisseria meningitidis NOT DETECTED NOT  DETECTED Final   Pseudomonas aeruginosa NOT DETECTED NOT DETECTED Final   Stenotrophomonas maltophilia NOT DETECTED NOT DETECTED Final   Candida albicans NOT DETECTED NOT DETECTED Final   Candida auris NOT DETECTED NOT DETECTED Final   Candida glabrata NOT DETECTED NOT DETECTED Final   Candida krusei NOT DETECTED NOT DETECTED Final   Candida parapsilosis NOT DETECTED NOT DETECTED Final   Candida tropicalis NOT DETECTED NOT DETECTED Final   Cryptococcus neoformans/gattii NOT DETECTED NOT DETECTED Final    Comment: Performed at [facility], [place] [address] [place], [place] [address]  Urine Culture     Status: Abnormal   Collection Time: 01/10/22  5:48 PM   Specimen: Urine, Clean Catch  Result Value Ref Range Status   Specimen Description   Final    URINE, CLEAN CATCH Performed at [facility], [address] [place], [place] [address]    Special Requests   Final    NONE Performed at [facility], [address] [place], [place] [address]    Culture (A)  Final    >=100,000 COLONIES/mL ESCHERICHIA COLI Confirmed Extended Spectrum Beta-Lactamase Producer (ESBL).  In bloodstream infections from ESBL organisms, carbapenems are preferred over piperacillin/tazobactam. They are shown to have a lower risk of mortality.    Report Status 01/13/2022 FINAL  Final   Organism ID, Bacteria ESCHERICHIA COLI (A)  Final      Susceptibility   Escherichia coli - MIC*    AMPICILLIN >=32 RESISTANT Resistant     CEFAZOLIN >=64 RESISTANT Resistant     CEFEPIME >=32 RESISTANT Resistant     CEFTRIAXONE >=64 RESISTANT Resistant     CIPROFLOXACIN >=4 RESISTANT Resistant     GENTAMICIN >=16 RESISTANT Resistant     IMIPENEM <=0.25 SENSITIVE Sensitive     NITROFURANTOIN 128 RESISTANT Resistant     TRIMETH/SULFA >=320 RESISTANT Resistant     AMPICILLIN/SULBACTAM >=32 RESISTANT Resistant     PIP/TAZO 16 SENSITIVE Sensitive     * >=100,000 COLONIES/mL ESCHERICHIA COLI  Culture, blood (x 2)     Status:  None (Preliminary result)   Collection Time: 01/11/22  7:48 AM   Specimen: BLOOD  Result Value Ref Range Status   Specimen Description   Final    BLOOD BLOOD RIGHT HAND Performed at [facility], [address] [place], [place] [address]    Special Requests   Final    BOTTLES DRAWN AEROBIC AND ANAEROBIC Blood Culture results may not be optimal due to an inadequate volume of blood received in culture bottles Performed at [facility], [address], [place], [place] [address]    Culture   Final    NO GROWTH 2 DAYS Performed at [facility], [address] [place], [place] [address]    Report Status PENDING  Incomplete  Culture, blood (x 2)     Status: None (Preliminary  result)   Collection Time: 01/11/22  7:58 AM   Specimen: BLOOD  Result Value Ref Range Status   Specimen Description   Final    BLOOD BLOOD RIGHT HAND Performed at [facility], [address] [place], [place] [address]    Special Requests   Final    BOTTLES DRAWN AEROBIC AND ANAEROBIC Blood Culture results may not be optimal due to an inadequate volume of blood received in culture bottles Performed at [facility], [address], [place], [place] [address]    Culture   Final    NO GROWTH 2 DAYS Performed at [facility], [address] [place], [place]  [address]    Report Status PENDING  Incomplete  MRSA Next Gen by PCR, Nasal     Status: Abnormal   Collection Time: 01/11/22 11:02 AM   Specimen: Nasal Mucosa; Nasal Swab  Result Value Ref Range Status   MRSA by PCR Next Gen DETECTED (A) NOT DETECTED Final    Comment: RESULT CALLED TO, READ BACK BY AND VERIFIED WITH: DANNY GREBER @ 5697 ON 01/11/22 C VARNER (NOTE) The GeneXpert MRSA Assay (FDA approved for NASAL specimens only), is one component of a comprehensive MRSA colonization surveillance program. It is not intended to diagnose MRSA infection nor to  guide or monitor treatment for MRSA infections. Test performance is not FDA approved in patients less than 31 years old. Performed at [facility], [address], [place], [place] [address]   Culture, blood (Routine X 2) w Reflex to ID Panel     Status: None (Preliminary result)   Collection Time: 01/12/22  1:53 AM   Specimen: BLOOD RIGHT FOREARM  Result Value Ref Range Status   Specimen Description BLOOD RIGHT FOREARM  Final   Special Requests   Final    BOTTLES DRAWN AEROBIC ONLY Blood Culture adequate volume   Culture   Final    NO GROWTH 1 DAY Performed at [facility], [address] [place], [place] [address]    Report Status PENDING  Incomplete  Culture, blood (Routine X 2) w Reflex to ID Panel     Status: None (Preliminary result)   Collection Time: 01/12/22  1:53 AM   Specimen: Right Antecubital; Blood  Result Value Ref Range Status   Specimen Description RIGHT ANTECUBITAL  Final   Special Requests   Final    BOTTLES DRAWN AEROBIC AND ANAEROBIC Blood Culture results may not be optimal due to an excessive volume of blood received in culture bottles   Culture   Final    NO GROWTH 1 DAY Performed at [facility], [address], [place], [place] [address]    Report Status PENDING  Incomplete    Alcide Evener, [place] [facility] [phone] pager  01/13/2022, 11:40 AM

## 2022-01-13 NOTE — NC FL2 (Signed)
[place] LEVEL OF CARE SCREENING TOOL     IDENTIFICATION  Patient Name: Danny [name] Birthdate: 1955-12-01 Sex: male Admission Date (Current Location): 01/10/2022  [facility] and [place] Number:  [employer] and Address:  [place] [address], [place]      Provider Number: [id number]  Attending Physician Name and Address:  Deatra James, MD  Relative Name and Phone Number:  Hulan Amato (Sister)   [phone]    Current Level of Care: Hospital Recommended Level of Care: [place] Prior Approval Number:    Date Approved/Denied:   PASRR Number:    Discharge Plan: SNF    Current Diagnoses: Patient Active Problem List   Diagnosis Date Noted   Septic shock ([place]) 01/10/2022   Mixed hyperlipidemia 01/10/2022   Hypotension 01/10/2022   Open wound of right foot 01/10/2022   Lactic acidosis 01/10/2022   Flatulence 11/29/2021   Constipation 11/29/2021   Anemia in ESRD (end-stage renal disease) ([place]) 11/28/2021   Atrial fibrillation with rapid ventricular response ([place]) 10/12/2021   Atrial fibrillation, chronic ([place]) 08/12/2021   Type 2 diabetes mellitus ([place]) 08/12/2021   Obesity (BMI 30-39.9) 08/12/2021   Iron deficiency anemia 04/21/2021   Goals of care, counseling/discussion 12/24/1599   Acute metabolic encephalopathy 09/32/3557   ESRD on dialysis ([place])    B12 deficiency 12/06/2020   Osteomyelitis of right ankle (HCC)    Cellulitis, leg 10/28/2020   CKD (chronic kidney disease) stage 4, GFR 15-29 ml/min (HCC) 10/28/2020   Moderate protein-calorie malnutrition ([place]) 10/26/2020   Medical non-compliance 10/26/2020   Chronic ulcer of right ankle (HCC)    Venous stasis    Cellulitis 10/25/2020   Normocytic anemia 09/13/2020   Class 2 obesity    Respiratory failure ([place]) 03/23/2020   Acute on chronic anemia 01/16/2020   Benign prostatic hyperplasia with urinary obstruction 01/15/2020   AKI  (acute kidney injury) ([place]) 07/08/2019   Hypoxia 07/08/2019   Decubitus ulcers 07/08/2019   Edema of both lower extremities    Anasarca 03/23/2019   Bladder outlet obstruction 03/23/2019   Diabetic ulcer of left foot ([place]) 03/15/2019   Diabetic ulcer of ankle ([place]) 03/15/2019   Hypertension associated with stage 3 chronic kidney disease due to type 2 diabetes mellitus ([place]) 03/09/2019   Controlled type 2 diabetes mellitus with stage 3 chronic kidney disease, with long-term current use of insulin ([place]) 03/09/2019   Type 2 diabetes with nephropathy ([place]) 03/09/2019   Chronic gout due to renal impairment without tophus 03/09/2019   Emphysematous cystitis 03/05/2019   Bilateral hydronephrosis    Bilateral cellulitis of lower leg 03/03/2019   Klebsiella Cystitis 03/03/2019   Plantar ulcer of left foot ([place]) 03/03/2019   Chronic diastolic CHF (congestive heart failure) ([place]) 02/25/2019   Acute respiratory failure with hypoxia ([place]) 02/25/2019   Acute renal failure superimposed on stage 4 chronic kidney disease ([place]) 02/24/2019   Congestive heart failure ([place]) 02/24/2019   Dyspnea 02/23/2019   Essential hypertension 02/23/2019   Diabetes mellitus ([place]) 02/23/2019   CKD (chronic kidney disease) 02/23/2019   Psoriasis 02/23/2019    Orientation RESPIRATION BLADDER Height & Weight     Self, Time, Situation, Place  O2   Weight: 262 lb 12.6 oz (119.2 kg) Height:  '6\' 1"'$  (185.4 cm)  BEHAVIORAL SYMPTOMS/MOOD NEUROLOGICAL BOWEL NUTRITION STATUS      Incontinent Diet (heart healthy)  AMBULATORY STATUS COMMUNICATION OF NEEDS Skin   Extensive Assist Verbally PU  Stage and Appropriate Care (Wound: right ankle)                       Personal Care Assistance Level of Assistance  Bathing, Feeding, Dressing Bathing Assistance: Maximum assistance Feeding assistance: Independent Dressing Assistance: Maximum assistance     Functional Limitations Info  Sight, Hearing, Speech Sight Info:  Adequate Hearing Info: Adequate Speech Info: Adequate    SPECIAL CARE FACTORS FREQUENCY                       Contractures Contractures Info: Not present    Additional Factors Info  Code Status, Allergies, Isolation Precautions Code Status Info: DNR Allergies Info: dust mite extract, prednisone, rocephin     Isolation Precautions Info: ESBL, MRSA     Current Medications (01/13/2022):  This is the current hospital active medication list Current Facility-Administered Medications  Medication Dose Route Frequency Provider Last Rate Last Admin   0.9 %  sodium chloride infusion  250 mL Intravenous Continuous Adefeso, Oladapo, DO   Stopped at 01/11/22 0915   acetaminophen (TYLENOL) tablet 650 mg  650 mg Oral Q6H PRN Adefeso, Oladapo, DO       Or   acetaminophen (TYLENOL) suppository 650 mg  650 mg Rectal Q6H PRN Adefeso, Oladapo, DO       acidophilus (RISAQUAD) capsule 1 capsule  1 capsule Oral TID WC Shahmehdi, Seyed A, MD   1 capsule at 01/13/22 1138   ALPRAZolam (XANAX) tablet 0.5 mg  0.5 mg Oral TID PRN Deatra James, MD       amiodarone (NEXTERONE PREMIX) 360-4.14 MG/200ML-% (1.8 mg/mL) IV infusion  60 mg/hr Intravenous Continuous Hilty, Nadean Corwin, MD 33.3 mL/hr at 01/13/22 1357 60 mg/hr at 01/13/22 1357   Followed by   amiodarone (NEXTERONE PREMIX) 360-4.14 MG/200ML-% (1.8 mg/mL) IV infusion  30 mg/hr Intravenous Continuous Hilty, Nadean Corwin, MD       apixaban Arne [place]) tablet 5 mg  5 mg Oral BID Adefeso, Oladapo, DO   5 mg at 01/13/22 3419   Chlorhexidine Gluconate Cloth 2 % PADS 6 each  6 each Topical Q0600 Donato Heinz, MD   6 each at 01/13/22 0526   Chlorhexidine Gluconate Cloth 2 % PADS 6 each  6 each Topical Q0600 Corliss Parish, MD   6 each at 01/13/22 1033   Darbepoetin Alfa (ARANESP) injection 100 mcg  100 mcg Subcutaneous Q Thu-1800 Corliss Parish, MD   100 mcg at 01/12/22 1055   ferrous sulfate tablet 325 mg  325 mg Oral Daily Adefeso,  Oladapo, DO   325 mg at 01/13/22 0906   insulin aspart (novoLOG) injection 0-6 Units  0-6 Units Subcutaneous TID WC Adefeso, Oladapo, DO   1 Units at 01/12/22 1158   insulin detemir (LEVEMIR) injection 5 Units  5 Units Subcutaneous Daily Shahmehdi, Seyed A, MD   5 Units at 01/13/22 1014   levalbuterol (XOPENEX) nebulizer solution 0.63 mg  0.63 mg Nebulization Q4H PRN Zierle-Ghosh, Asia B, DO   0.63 mg at 01/11/22 2333   metoprolol succinate (TOPROL-XL) 24 hr tablet 50 mg  50 mg Oral Daily Shahmehdi, Seyed A, MD   50 mg at 01/13/22 0812   metoprolol tartrate (LOPRESSOR) injection 5 mg  5 mg Intravenous Q4H PRN Shahmehdi, Seyed A, MD   5 mg at 01/12/22 2319   midodrine (PROAMATINE) tablet 10 mg  10 mg Oral BID Donato Heinz, MD   10 mg at 01/13/22 [id number]  mupirocin ointment (BACTROBAN) 2 %   Nasal BID Deatra James, MD   Given at 01/13/22 0915   omega-3 acid ethyl esters (LOVAZA) capsule 1 g  1 g Oral Daily Adefeso, Oladapo, DO   1 g at 01/13/22 0906   ondansetron (ZOFRAN) tablet 4 mg  4 mg Oral Q6H PRN Adefeso, Oladapo, DO       Or   ondansetron (ZOFRAN) injection 4 mg  4 mg Intravenous Q6H PRN Adefeso, Oladapo, DO   4 mg at 01/12/22 1402   pantoprazole (PROTONIX) EC tablet 40 mg  40 mg Oral Daily Vinie Sill C, NP   40 mg at 01/13/22 1314   penicillin G potassium 2 Million Units in dextrose 5 % 50 mL IVPB  2 Million Units Intravenous Q4H Shahmehdi, Seyed A, MD 100 mL/hr at 01/13/22 1139 2 Million Units at 01/13/22 1139   pentafluoroprop-tetrafluoroeth (GEBAUERS) aerosol 1 Application  1 Application Topical PRN Donato Heinz, MD       phenylephrine (NEO-SYNEPHRINE) '20mg'$ /NS 228m premix infusion  25-200 mcg/min Intravenous Titrated Shahmehdi, Seyed A, MD   Stopped at 01/12/22 0415   rosuvastatin (CRESTOR) tablet 5 mg  5 mg Oral QPM Adefeso, Oladapo, DO   5 mg at 01/12/22 1642   senna (SENOKOT) tablet 17.2 mg  2 tablet Oral BID Adefeso, Oladapo, DO   17.2 mg at 01/13/22 03888   sevelamer carbonate (RENVELA) tablet 800 mg  800 mg Oral BID WC Adefeso, Oladapo, DO   800 mg at 01/13/22 0803   sodium chloride (OCEAN) 0.65 % nasal spray 1 spray  1 spray Each Nare PRN Zierle-Ghosh, Asia B, DO         Discharge Medications: Please see discharge summary for a list of discharge medications.  Relevant Imaging Results:  Relevant Lab Results:   Additional Information SSN: [id number] On HD  Haniel Fix, HClydene Pugh LCSW

## 2022-01-13 NOTE — Progress Notes (Signed)
Subjective:  Had HD yesterday-  removed 3 liters which he desperately needed but then complicated by Afib-  is now DNR FYI   Objective Vital signs in last 24 hours: Vitals:   01/13/22 0700 01/13/22 0730 01/13/22 0800 01/13/22 0812  BP: (!) 106/46 (!) 116/50 110/72   Pulse: (!) 115 (!) 38 (!) 124 (!) 127  Resp: (!) 21  20   Temp:    98.9 F (37.2 C)  TempSrc:      SpO2: 99% 95% 95%   Weight:      Height:       Weight change: 0 kg  Intake/Output Summary (Last 24 hours) at 01/13/2022 0836 Last data filed at 01/12/2022 1600 Gross per 24 hour  Intake 1634.71 ml  Output 3 ml  Net 1631.71 ml   Dialysis Orders: Center: [facility]  on TTS . EDW 110 kg HD Bath 2K/2.5Ca  Time 4 hours Heparin none. Access LUE AVF BFR 400 DFR 500    Venofer  50 mg IV once a week     Assessment/ Plan: Pt is a 66 y.o. yo male with ESRD who was admitted on 01/10/2022 with fever, weakness-  sepsis syndrome   Assessment/Plan: 1. Fever/presumed sepsis-  thought due to leg wound ? Now on cefipime and vanc-  clinically improved -   off pressors  2. ESRD - normally TTS [facility]-  planning for routine HD tomorrow via AVF 3. Anemia -   hgb just below 10 to start, now down to 7.8-  giving ESA but no iron in the setting of sepsis-  transfuse as needed 4. Secondary hyperparathyroidism- cont renvela-  is not on vit D or sensipar it seems  5. HTN/volume-  massively volume overloaded-  pitting edema to his abdominal wall-  I think his EDW needs to be lowered as OP-  will attempt max UF today -  was on pressors but no longer-  on midodrine as well --  now complication of Afib but feel like we really need to keep pushing UF   Pt will not be physically seen over the weekend-  call with questions   Louis Meckel    Labs: Basic Metabolic Panel: Recent Labs  Lab 01/11/22 0337 01/11/22 0749 01/12/22 0940 01/13/22 0353  NA 128* 131* 131* 131*  K 4.3 4.7 4.7 4.2  CL 95* 99 99 98  CO2 21* 20* 22 26  GLUCOSE  314* 204* 163* 165*  BUN 52* 58* 70* 45*  CREATININE 4.26* 4.49* 5.07* 3.61*  CALCIUM 7.3* 7.5* 7.5* 7.4*  PHOS 4.5  --   --   --    Liver Function Tests: Recent Labs  Lab 01/11/22 0749 01/12/22 0940 01/13/22 0353  AST 21 26 37  ALT '19 19 23  '$ ALKPHOS 168* 136* 180*  BILITOT 2.5* 1.7* 1.9*  PROT 6.7 5.9* 5.6*  ALBUMIN 1.8* 1.6* <1.5*   No results for input(s): "LIPASE", "AMYLASE" in the last 168 hours. No results for input(s): "AMMONIA" in the last 168 hours. CBC: Recent Labs  Lab 01/10/22 1713 01/11/22 0337 01/11/22 0749 01/12/22 0940 01/13/22 0353  WBC 10.6* 20.5* 19.1* 11.9* 10.2  NEUTROABS 10.1*  --  16.6*  --   --   HGB 9.6* 9.2* 9.5* 7.8* 7.8*  HCT 29.7* 29.3* 30.2* 23.8* 24.3*  MCV 84.1 84.9 85.1 82.9 82.9  PLT 251 261 280 243 229   Cardiac Enzymes: No results for input(s): "CKTOTAL", "CKMB", "CKMBINDEX", "TROPONINI" in the last 168 hours.  CBG: Recent Labs  Lab 01/12/22 0747 01/12/22 1151 01/12/22 1614 01/12/22 2110 01/13/22 0734  GLUCAP 132* 151* 149* 175* 110*    Iron Studies: No results for input(s): "IRON", "TIBC", "TRANSFERRIN", "FERRITIN" in the last 72 hours. Studies/Results: US Venous Img Lower Bilateral (DVT)  Result Date: 01/11/2022 CLINICAL DATA:  Bilateral lower extremity edema and pain. EXAM: BILATERAL LOWER EXTREMITY VENOUS DOPPLER ULTRASOUND TECHNIQUE: Gray-scale sonography with graded compression, as well as color Doppler and duplex ultrasound were performed to evaluate the lower extremity deep venous systems from the level of the common femoral vein and including the common femoral, femoral, profunda femoral, popliteal and calf veins including the posterior tibial, peroneal and gastrocnemius veins when visible. The superficial great saphenous vein was also interrogated. Spectral Doppler was utilized to evaluate flow at rest and with distal augmentation maneuvers in the common femoral, femoral and popliteal veins. COMPARISON:  None Available.  FINDINGS: RIGHT LOWER EXTREMITY Common Femoral Vein: No evidence of thrombus. Normal compressibility, respiratory phasicity and response to augmentation. Saphenofemoral Junction: No evidence of thrombus. Normal compressibility and flow on color Doppler imaging. Profunda Femoral Vein: No evidence of thrombus. Normal compressibility and flow on color Doppler imaging. Femoral Vein: No evidence of thrombus. Normal compressibility, respiratory phasicity and response to augmentation. Popliteal Vein: No evidence of thrombus. Normal compressibility, respiratory phasicity and response to augmentation. Calf Veins: No evidence of thrombus. Normal compressibility and flow on color Doppler imaging. LEFT LOWER EXTREMITY Common Femoral Vein: No evidence of thrombus. Normal compressibility, respiratory phasicity and response to augmentation. Saphenofemoral Junction: No evidence of thrombus. Normal compressibility and flow on color Doppler imaging. Profunda Femoral Vein: No evidence of thrombus. Normal compressibility and flow on color Doppler imaging. Femoral Vein: No evidence of thrombus. Normal compressibility, respiratory phasicity and response to augmentation. Popliteal Vein: No evidence of thrombus. Normal compressibility, respiratory phasicity and response to augmentation. Calf Veins: No evidence of thrombus. Normal compressibility and flow on color Doppler imaging. IMPRESSION: No evidence of deep venous thrombosis in either lower extremity. Electronically Signed   By: Margaretha Sheffield M.D.   On: 01/11/2022 09:17   Medications: Infusions:  sodium chloride Stopped (01/11/22 0915)   pencillin G potassium IV 2 Million Units (01/13/22 0805)   phenylephrine (NEO-SYNEPHRINE) Adult infusion Stopped (01/12/22 0415)    Scheduled Medications:  acidophilus  1 capsule Oral TID WC   apixaban  5 mg Oral BID   Chlorhexidine Gluconate Cloth  6 each Topical Q0600   ciprofloxacin  500 mg Oral Q24H   darbepoetin (ARANESP)  injection - NON-DIALYSIS  100 mcg Subcutaneous Q Thu-1800   ferrous sulfate  325 mg Oral Daily   insulin aspart  0-6 Units Subcutaneous TID WC   insulin detemir  5 Units Subcutaneous Daily   metoprolol succinate  50 mg Oral Daily   midodrine  10 mg Oral BID   mupirocin ointment   Nasal BID   omega-3 acid ethyl esters  1 g Oral Daily   pantoprazole  40 mg Oral Daily   rosuvastatin  5 mg Oral QPM   senna  2 tablet Oral BID   sevelamer carbonate  800 mg Oral BID WC    have reviewed scheduled and prn medications.  Physical Exam: General:  obese, slow mentally , c/o pain  really all over Heart: RRR Lungs: dec BS at the bases Abdomen: obese-  pitting edema to abdominal wall  Extremities: pitting edema-  mult abrasions-  left ankle wrapped  Dialysis Access: AVF  01/13/2022,8:36 AM  LOS: 3 days

## 2022-01-14 ENCOUNTER — Encounter (HOSPITAL_COMMUNITY): Payer: Self-pay | Admitting: Family Medicine

## 2022-01-14 DIAGNOSIS — R6521 Severe sepsis with septic shock: Secondary | ICD-10-CM | POA: Diagnosis not present

## 2022-01-14 DIAGNOSIS — A419 Sepsis, unspecified organism: Secondary | ICD-10-CM | POA: Diagnosis not present

## 2022-01-14 LAB — COMPREHENSIVE METABOLIC PANEL
ALT: 27 U/L (ref 0–44)
AST: 37 U/L (ref 15–41)
Albumin: 1.6 g/dL — ABNORMAL LOW (ref 3.5–5.0)
Alkaline Phosphatase: 198 U/L — ABNORMAL HIGH (ref 38–126)
Anion gap: 10 (ref 5–15)
BUN: 54 mg/dL — ABNORMAL HIGH (ref 8–23)
CO2: 22 mmol/L (ref 22–32)
Calcium: 7.5 mg/dL — ABNORMAL LOW (ref 8.9–10.3)
Chloride: 97 mmol/L — ABNORMAL LOW (ref 98–111)
Creatinine, Ser: 4.24 mg/dL — ABNORMAL HIGH (ref 0.61–1.24)
GFR, Estimated: 14 mL/min — ABNORMAL LOW (ref >60)
Glucose, Bld: 165 mg/dL — ABNORMAL HIGH (ref 70–99)
Potassium: 5.4 mmol/L — ABNORMAL HIGH (ref 3.5–5.1)
Sodium: 129 mmol/L — ABNORMAL LOW (ref 135–145)
Total Bilirubin: 1.8 mg/dL — ABNORMAL HIGH (ref 0.3–1.2)
Total Protein: 6 g/dL — ABNORMAL LOW (ref 6.5–8.1)

## 2022-01-14 LAB — CBC
HCT: 27.1 % — ABNORMAL LOW (ref 39.0–52.0)
Hemoglobin: 8.7 g/dL — ABNORMAL LOW (ref 13.0–17.0)
MCH: 26.6 pg (ref 26.0–34.0)
MCHC: 32.1 g/dL (ref 30.0–36.0)
MCV: 82.9 fL (ref 80.0–100.0)
Platelets: 267 10*3/uL (ref 150–400)
RBC: 3.27 MIL/uL — ABNORMAL LOW (ref 4.22–5.81)
RDW: 19.9 % — ABNORMAL HIGH (ref 11.5–15.5)
WBC: 11.3 10*3/uL — ABNORMAL HIGH (ref 4.0–10.5)
nRBC: 0.7 % — ABNORMAL HIGH (ref 0.0–0.2)

## 2022-01-14 LAB — GLUCOSE, CAPILLARY
Glucose-Capillary: 140 mg/dL — ABNORMAL HIGH (ref 70–99)
Glucose-Capillary: 138 mg/dL — ABNORMAL HIGH (ref 70–99)
Glucose-Capillary: 111 mg/dL — ABNORMAL HIGH (ref 70–99)
Glucose-Capillary: 129 mg/dL — ABNORMAL HIGH (ref 70–99)

## 2022-01-14 LAB — C-REACTIVE PROTEIN: CRP: 13.7 mg/dL — ABNORMAL HIGH (ref <1.0)

## 2022-01-14 LAB — SEDIMENTATION RATE: Sed Rate: 55 mm/hr — ABNORMAL HIGH (ref 0–16)

## 2022-01-14 MED ORDER — ALBUMIN HUMAN 25 % IV SOLN
25.0000 g | INTRAVENOUS | Status: DC | PRN
  Administered 2022-01-14 – 2022-01-17 (×2): 25 g via INTRAVENOUS

## 2022-01-14 NOTE — Progress Notes (Signed)
PROGRESS NOTE    Patient: [name]                            PCP: Celene Squibb, MD                    DOB: 1955-08-11            DOA: 01/10/2022 FHQ:[id number]             DOS: 01/14/2022, 11:19 AM   LOS: 4 days   Date of Service: The patient was seen and examined on 01/14/2022  Subjective:   The patient was seen and examined this morning, stable no acute distress, tolerated MRI of the lower extremity yesterday. Hemodynamically stable Heart rate of Amio drip Mildly hypotensive BP 93/57, 108/81 now, satting 100% on 3 L oxygen  MRI of the lower extremity could not rule out osteomyelitis, revealed severe osteoarthritis   Brief Narrative:   JALIEN WEAKLAND is a 66 y.o. male with medical history significant of hypertension, ESRD (TTS), A-fib on Eliquis who presents to the emergency department via EMS from a nursing facility due to fever and weakness.   Patient states that he went to dialysis this morning, but no fluid was taken off him. Unable to provide further history, history was obtained from the ED physician and ED medical record.  Per report, patient was found to be hypoxic with O2 sat in the 80s, supplemental oxygen via [place] at 4 LPM was provided.  Patient was reported to be weak and confused today (different from baseline, good sense of humor, AOx3), he was reported to be fine during shift signout at the nursing facility.       ED Course:  Tmax of 102.30F, tachypneic, tachycardic.  Work-up in the ED showed normal CBC except for WBC of 10.6 BMP showed sodium 133, potassium 4.2, chloride 96, bicarb 26, glucose 129, BUN 55, creatinine 4.34, calcium 7.7, albumin 2.0, alkaline phosphatase 187, AST 22, ALT 18, total bilirubin 2.2, eGFR 14, lactic acid 2.0, sed rate 100.  Urinalysis was positive for large leukocytes, > 50 WBC, and many bacteria.  Influenza A, B, SARS coronavirus 2 was negative.  Blood culture pending. Chest x-ray showed possible small pleural effusions.  No overt  interstitial edema. He was treated with IV cefepime, Flagyl, vancomycin, Zofran was given, IV hydration was provided.  Tylenol was given due to fever.  Patient's BP continued to be in hypotensive range, he was started on peripheral IV Levophed.   Hospitalist was asked to admit patient for further evaluation and management.    Assessment & Plan:   Principal Problem:   Septic shock ([place]) Active Problems:   Atrial fibrillation, chronic (HCC)   Cellulitis, leg   ESRD on dialysis ([place])   Acute metabolic encephalopathy   Iron deficiency anemia   Type 2 diabetes mellitus (HCC)   Obesity (BMI 30-39.9)   Mixed hyperlipidemia   Hypotension   Open wound of right foot   Lactic acidosis   Acute encephalopathy   Acute hypoxemic respiratory failure (HCC)   Severe sepsis (HCC)   Streptococcal bacteremia   Chronic osteomyelitis, ankle (HCC)   History of revision of total knee arthroplasty   Sepsis-septic shock Bacteremia: Streptococcus, UTI E. coli -Possible source of infection right leg cellulitis -versus bilateral ankle osteomyelitis  -Sepsis/septic shock physiology improving -Off Levophed, status post IV fluid resuscitation, IV antibiotic  -Blood cultures are growing group  B strep,  - Urine culture growing E. Coli -ESBL-likely chronically colonized -Infectious disease consulted, recommended MRI of the lower extremities, continue current antibiotics of pen G -and to monitor for dysuria, discontinued antibiotic coverage for ESBL   -Discontinuing broad-spectrum antibiotics of vancomycin, Flagyl, cefepime today 01/12/2022 -Continuing penicillin G treatment for ID recommendation  Met sepsis septic shock criteria on admission-with encephalopathy, acute respiratory failure, hypotensive fever, tachycardia, tachypnea, leukocytosis -As needed Tylenol,  -Monitoring in ICU setting  Acute metabolic encephalopathy -Resolved -- likely it was due to sepsis, septic shock -Continue treating  underlying causes    Hypotension -acute on chronic  -Blood pressure remains soft but stable -Successfully weaned off Levophed -Continue home medication of midodrine    Right foot wound Continue wound care -[place] lower extremity Doppler>>> No evidence of deep venous thrombosis in either lower extremity. -Bilateral lower extremity MRI, reporting significant inflammation, osteoarthritis, cannot rule out osteomyelitis   Lactic acidosis Lactic acid 2.0, >>3.0 >>2.4   continue to trend lactic acid  ESRD on HD (TTS) Patient states that he had dialysis today but no fluid was taken off him Continue [facility] consulted for maintenance dialysis -Planning for hemodialysis today 7/20, 7/22   A-fib on Eliquis Continue Eliquis Toprol-XL will be held at this time due to hypotension   Type 2 diabetes mellitus Continue ISS and hypoglycemia protocol   Iron deficiency anemia Continue ferrous sulfate Continue senna to prevent constipation  Mixed hyperlipidemia Continue statin Continue Lovaza   Obesity (BMI 32.66 kgm) Diet and lifestyle modification  Severe debility -Once stable PT OT will be consulted for evaluation and recommendation ----------------------------------------------------------------------------------------------------------------------------------------------- Nutritional status:  The patient's BMI is: Body mass index is 34.67 kg/m. I agree with the assessment and plan as outline  Skin Assessment: I have examined the patient's skin and I agree with the wound assessment as performed by wound care team As outlined belowe:    --------------------------------------------------------------------------------------------------------------------------------------------- Cultures; Blood Cultures x 2 >> group B strep Repeat blood cultures >>> up-to-date Urine Culture  >>> E. Coli -ESBL Sputum Culture >> does not been  obtained  -------------------------------------------------------------------------------------------------------------------------------------------  DVT prophylaxis:  SCDs Start: 01/10/22 1933 apixaban (ELIQUIS) tablet 5 mg   Code Status:   Code Status: DNR  Family Communication: No family member present at bedside- attempt will be made to update daily The above findings and plan of care has been discussed with patient (and family)  in detail,  they expressed understanding and agreement of above. -Advance care planning has been discussed.   Admission status:   Status is: Inpatient Remains inpatient appropriate because: Needing treatment for sepsis, septic shock, IV fluids, IV antibiotics,     Procedures:   No admission procedures for hospital encounter.   Antimicrobials:  Anti-infectives (From admission, onward)    Start     Dose/Rate Route Frequency Ordered Stop   01/12/22 1600  vancomycin (VANCOCIN) IVPB 1000 mg/200 mL premix  Status:  Discontinued        1,000 mg 200 mL/hr over 60 Minutes Intravenous Every T-Th-Sa (Hemodialysis) 01/11/22 1300 01/12/22 1012   01/12/22 1600  penicillin G potassium 2 Million Units in dextrose 5 % 50 mL IVPB        2 Million Units 100 mL/hr over 30 Minutes Intravenous Every 4 hours 01/12/22 1013     01/12/22 1200  penicillin G potassium 4 Million Units in dextrose 5 % 250 mL IVPB        4 Million Units 250 mL/hr over 60 Minutes Intravenous  Once 01/12/22 1012 01/12/22 1304   01/12/22 1130  ciprofloxacin (CIPRO) tablet 500 mg  Status:  Discontinued       Note to Pharmacy: UTI - E-coli   500 mg Oral Every 24 hours 01/12/22 1118 01/13/22 1112   01/11/22 2200  ceFEPIme (MAXIPIME) 1 g in sodium chloride 0.9 % 100 mL IVPB  Status:  Discontinued        1 g 200 mL/hr over 30 Minutes Intravenous Every 24 hours 01/10/22 1815 01/12/22 1012   01/11/22 0715  metroNIDAZOLE (FLAGYL) IVPB 500 mg  Status:  Discontinued        500 mg 100 mL/hr over 60  Minutes Intravenous Every 12 hours 01/11/22 0714 01/12/22 1012   01/10/22 1730  ceFEPIme (MAXIPIME) 2 g in sodium chloride 0.9 % 100 mL IVPB        2 g 200 mL/hr over 30 Minutes Intravenous  Once 01/10/22 1719 01/10/22 1804   01/10/22 1730  vancomycin (VANCOREADY) IVPB 2000 mg/400 mL        2,000 mg 200 mL/hr over 120 Minutes Intravenous  Once 01/10/22 1720 01/10/22 2132   01/10/22 1715  aztreonam (AZACTAM) 2 g in sodium chloride 0.9 % 100 mL IVPB  Status:  Discontinued        2 g 200 mL/hr over 30 Minutes Intravenous  Once 01/10/22 1713 01/10/22 1719   01/10/22 1715  metroNIDAZOLE (FLAGYL) IVPB 500 mg        500 mg 100 mL/hr over 60 Minutes Intravenous  Once 01/10/22 1713 01/10/22 1912   01/10/22 1715  vancomycin (VANCOCIN) IVPB 1000 mg/200 mL premix  Status:  Discontinued        1,000 mg 200 mL/hr over 60 Minutes Intravenous  Once 01/10/22 1713 01/10/22 1720        Medication:   acidophilus  1 capsule Oral TID WC   apixaban  5 mg Oral BID   Chlorhexidine Gluconate Cloth  6 each Topical Q0600   Chlorhexidine Gluconate Cloth  6 each Topical Q0600   darbepoetin (ARANESP) injection - NON-DIALYSIS  100 mcg Subcutaneous Q Thu-1800   ferrous sulfate  325 mg Oral Daily   insulin aspart  0-6 Units Subcutaneous TID WC   insulin detemir  5 Units Subcutaneous Daily   metoprolol succinate  50 mg Oral Daily   midodrine  10 mg Oral BID   mupirocin ointment   Nasal BID   omega-3 acid ethyl esters  1 g Oral Daily   pantoprazole  40 mg Oral Daily   rosuvastatin  5 mg Oral QPM   senna  2 tablet Oral BID   sevelamer carbonate  800 mg Oral BID WC    acetaminophen **OR** acetaminophen, ALPRAZolam, levalbuterol, metoprolol tartrate, ondansetron **OR** ondansetron (ZOFRAN) IV, pentafluoroprop-tetrafluoroeth, sodium chloride   Objective:   Vitals:   01/14/22 0736 01/14/22 0749 01/14/22 0800 01/14/22 1000  BP:  (!) 95/57 107/71 108/81  Pulse: 68 69 69 81  Resp: 15 (!) 22 (!) 22 (!) 23   Temp:  98.4 F (36.9 C)    TempSrc:  Axillary    SpO2: 99% 98% 100% 100%  Weight:      Height:        Intake/Output Summary (Last 24 hours) at 01/14/2022 1119 Last data filed at 01/14/2022 0736 Gross per 24 hour  Intake 974.8 ml  Output --  Net 974.8 ml    Filed Weights   01/12/22 0500 01/12/22 0904 01/13/22 0500  Weight: 111.1 kg 111.1 kg  119.2 kg     Examination:        Physical Exam:   General:  AAO x 3,  cooperative, no distress;   HEENT:  Normocephalic, PERRL, otherwise with in Normal limits   Neuro:  CNII-XII intact. , normal motor and sensation, reflexes intact   Lungs:   Clear to auscultation BL, Respirations unlabored,  No wheezes / crackles  Cardio:    S1/S2, RRR, No murmure, No Rubs or Gallops   Abdomen:  Soft, non-tender, bowel sounds active all four quadrants, no guarding or peritoneal signs.  Muscular  skeletal:  Limited exam -severe global generalized weaknesses Severe lower extremity weakness-nonambulatory at baseline - in bed, able to move all 4 extremities,   2+ pulses,  symmetric, No pitting edema  Skin:  Dry, warm to touch, negative for any Rashes,  Wounds: Please see nursing documentation    -extremity wounds BL erythema, open wound right ateral ankle                        ------------------------------------------------------------------------------------------------------------------------------------------    LABs:     Latest Ref Rng & Units 01/14/2022    2:30 AM 01/13/2022    3:53 AM 01/12/2022    9:40 AM  CBC  WBC 4.0 - 10.5 K/uL 11.3  10.2  11.9   Hemoglobin 13.0 - 17.0 g/dL 8.7  7.8  7.8   Hematocrit 39.0 - 52.0 % 27.1  24.3  23.8   Platelets 150 - 400 K/uL 267  229  243       Latest Ref Rng & Units 01/14/2022    2:30 AM 01/13/2022    3:53 AM 01/12/2022    9:40 AM  CMP  Glucose 70 - 99 mg/dL 165  165  163   BUN 8 - 23 mg/dL 54  45  70   Creatinine 0.61 - 1.24 mg/dL 4.24  3.61  5.07   Sodium 135 - 145 mmol/L 129   131  131   Potassium 3.5 - 5.1 mmol/L 5.4  4.2  4.7   Chloride 98 - 111 mmol/L 97  98  99   CO2 22 - 32 mmol/L _0 Calcium 8.9 - 10.3 mg/dL 7.5  7.4  7.5   Total Protein 6.5 - 8.1 g/dL 6.0  5.6  5.9   Total Bilirubin 0.3 - 1.2 mg/dL 1.8  1.9  1.7   Alkaline Phos 38 - 126 U/L 198  180  136   AST 15 - 41 U/L 37  37  26   ALT 0 - 44 U/L _1 Micro Results Recent Results (from the past 240 hour(s))  Resp Panel by RT-PCR (Flu A&B, Covid) Anterior Nasal Swab     Status: None   Collection Time: 01/10/22  5:20 PM   Specimen: Anterior Nasal Swab  Result Value Ref Range Status   SARS Coronavirus 2 by RT PCR NEGATIVE NEGATIVE Final    Comment: (NOTE) SARS-CoV-2 target nucleic acids are NOT DETECTED.  The SARS-CoV-2 RNA is generally detectable in upper respiratory specimens during the acute phase of infection. The lowest concentration of SARS-CoV-2 viral copies this assay can detect is 138 copies/mL. A negative result does not preclude SARS-Cov-2 infection and should not be used as the sole basis for treatment or other patient management decisions. A negative result may occur with  improper specimen collection/handling, submission of specimen other  than nasopharyngeal swab, presence of viral mutation(s) within the areas targeted by this assay, and inadequate number of viral copies(<138 copies/mL). A negative result must be combined with clinical observations, patient history, and epidemiological information. The expected result is Negative.  Fact Sheet for Patients:  EntrepreneurPulse.com.au  Fact Sheet for Healthcare Providers:  IncredibleEmployment.be  This test is no t yet approved or cleared by the [place] FDA and  has been authorized for detection and/or diagnosis of SARS-CoV-2 by FDA under an Emergency Use Authorization (EUA). This EUA will remain  in effect (meaning this test can be used) for the duration of  the COVID-19 declaration under Section 564(b)(1) of the Act, 21 U.S.C.section 360bbb-3(b)(1), unless the authorization is terminated  or revoked sooner.       Influenza A by PCR NEGATIVE NEGATIVE Final   Influenza B by PCR NEGATIVE NEGATIVE Final    Comment: (NOTE) The Xpert Xpress SARS-CoV-2/FLU/RSV plus assay is intended as an aid in the diagnosis of influenza from Nasopharyngeal swab specimens and should not be used as a sole basis for treatment. Nasal washings and aspirates are unacceptable for Xpert Xpress SARS-CoV-2/FLU/RSV testing.  Fact Sheet for Patients: EntrepreneurPulse.com.au  Fact Sheet for Healthcare Providers: IncredibleEmployment.be  This test is not yet approved or cleared by the [place] FDA and has been authorized for detection and/or diagnosis of SARS-CoV-2 by FDA under an Emergency Use Authorization (EUA). This EUA will remain in effect (meaning this test can be used) for the duration of the COVID-19 declaration under Section 564(b)(1) of the Act, 21 U.S.C. section 360bbb-3(b)(1), unless the authorization is terminated or revoked.  Performed at [facility], [address] [place], [place] [address]   Blood Culture (routine x 2)     Status: Abnormal   Collection Time: 01/10/22  5:20 PM   Specimen: BLOOD RIGHT FOREARM  Result Value Ref Range Status   Specimen Description   Final    BLOOD RIGHT FOREARM Performed at [facility], [address] [place], [place] [address]    Special Requests   Final    BOTTLES DRAWN AEROBIC AND ANAEROBIC Blood Culture results may not be optimal due to an inadequate volume of blood received in culture bottles Performed at [facility], [address] [place], [place] [address]    Culture  Setup Time   Final    GRAM POSITIVE COCCI BOTTLES DRAWN AEROBIC AND ANAEROBIC Gram Stain Report Called to,Read Back By and Verified With: FERRAINLO,J_0  BT MATTHEWS,B 7.19.2023 Performed at  [facility], [address] [place], [place] [address]    Culture (A)  Final    GROUP B STREP(S.AGALACTIAE)ISOLATED SUSCEPTIBILITIES PERFORMED ON PREVIOUS CULTURE WITHIN THE LAST 5 DAYS. Performed at [facility], [place] [address], [place], [place] [address]    Report Status 01/13/2022 FINAL  Final  Blood Culture (routine x 2)     Status: Abnormal   Collection Time: 01/10/22  5:20 PM   Specimen: BLOOD  Result Value Ref Range Status   Specimen Description   Final    BLOOD RIGHT ANTECUBITAL Performed at [facility], [place] [address], [place], [place] [address]    Special Requests   Final    BOTTLES DRAWN AEROBIC AND ANAEROBIC Blood Culture adequate volume Performed at [facility], [address], [place], [place] [address]    Culture  Setup Time   Final    GRAM POSITIVE COCCI BOTTLES DRAWN AEROBIC AND ANAEROBIC Gram Stain Report Called to,Read Back By and  Verified With: FERRAINLO,J_0  BY MAATHEWS,B 7.19.2023 Organism ID to follow CRITICAL RESULT CALLED TO, READ BACK BY AND VERIFIED WITH: Chucky May PHARMD, AT [place] Performed at [facility], [place] [address], [place], [place] [address]    Culture GROUP B STREP(S.AGALACTIAE)ISOLATED (A)  Final   Report Status 01/13/2022 FINAL  Final   Organism ID, Bacteria GROUP B STREP(S.AGALACTIAE)ISOLATED  Final      Susceptibility   Group b strep(s.agalactiae)isolated - MIC*    CLINDAMYCIN <=0.25 SENSITIVE Sensitive     AMPICILLIN <=0.25 SENSITIVE Sensitive     ERYTHROMYCIN 0.5 INTERMEDIATE Intermediate     VANCOMYCIN 0.5 SENSITIVE Sensitive     CEFTRIAXONE <=0.12 SENSITIVE Sensitive     LEVOFLOXACIN 0.5 SENSITIVE Sensitive     PENICILLIN Value in next row Sensitive      SENSITIVE0.12    * GROUP B STREP(S.AGALACTIAE)ISOLATED  Blood Culture ID Panel (Reflexed)     Status: Abnormal   Collection Time: 01/10/22  5:20 PM  Result Value Ref Range Status   Enterococcus faecalis NOT DETECTED NOT DETECTED Final    Enterococcus Faecium NOT DETECTED NOT DETECTED Final   Listeria monocytogenes NOT DETECTED NOT DETECTED Final   Staphylococcus species NOT DETECTED NOT DETECTED Final   Staphylococcus aureus (BCID) NOT DETECTED NOT DETECTED Final   Staphylococcus epidermidis NOT DETECTED NOT DETECTED Final   Staphylococcus lugdunensis NOT DETECTED NOT DETECTED Final   Streptococcus species DETECTED (A) NOT DETECTED Final    Comment: CRITICAL RESULT CALLED TO, READ BACK BY AND VERIFIED WITH: G. COFFEE PHARMD, AT [phone] [address]    Streptococcus agalactiae DETECTED (A) NOT DETECTED Final    Comment: CRITICAL RESULT CALLED TO, READ BACK BY AND VERIFIED WITH: G. COFFEE PHARMD, AT [phone] [address]    Streptococcus pneumoniae NOT DETECTED NOT DETECTED Final   Streptococcus pyogenes NOT DETECTED NOT DETECTED Final   A.calcoaceticus-baumannii NOT DETECTED NOT DETECTED Final   Bacteroides fragilis NOT DETECTED NOT DETECTED Final   Enterobacterales NOT DETECTED NOT DETECTED Final   Enterobacter cloacae complex NOT DETECTED NOT DETECTED Final   Escherichia coli NOT DETECTED NOT DETECTED Final   Klebsiella aerogenes NOT DETECTED NOT DETECTED Final   Klebsiella oxytoca NOT DETECTED NOT DETECTED Final   Klebsiella pneumoniae NOT DETECTED NOT DETECTED Final   Proteus species NOT DETECTED NOT DETECTED Final   Salmonella species NOT DETECTED NOT DETECTED Final   Serratia marcescens NOT DETECTED NOT DETECTED Final   Haemophilus influenzae NOT DETECTED NOT DETECTED Final   Neisseria meningitidis NOT DETECTED NOT DETECTED Final   Pseudomonas aeruginosa NOT DETECTED NOT DETECTED Final   Stenotrophomonas maltophilia NOT DETECTED NOT DETECTED Final   Candida albicans NOT DETECTED NOT DETECTED Final   Candida auris NOT DETECTED NOT DETECTED Final   Candida glabrata NOT DETECTED NOT DETECTED Final   Candida krusei NOT DETECTED NOT DETECTED Final   Candida parapsilosis NOT DETECTED NOT DETECTED Final   Candida  tropicalis NOT DETECTED NOT DETECTED Final   Cryptococcus neoformans/gattii NOT DETECTED NOT DETECTED Final    Comment: Performed at [facility], [address] [place], [place] [address]  Urine Culture     Status: Abnormal   Collection Time: 01/10/22  5:48 PM   Specimen: Urine, Clean Catch  Result Value Ref Range Status   Specimen Description   Final    URINE, CLEAN CATCH Performed at [facility], [address], [place], [place] [address]    Special Requests  Final    NONE Performed at [facility], [address], [place], [place] [address]    Culture (A)  Final    >=100,000 COLONIES/mL ESCHERICHIA COLI Confirmed Extended Spectrum Beta-Lactamase Producer (ESBL).  In bloodstream infections from ESBL organisms, carbapenems are preferred over piperacillin/tazobactam. They are shown to have a lower risk of mortality.    Report Status 01/13/2022 FINAL  Final   Organism ID, Bacteria ESCHERICHIA COLI (A)  Final      Susceptibility   Escherichia coli - MIC*    AMPICILLIN >=32 RESISTANT Resistant     CEFAZOLIN >=64 RESISTANT Resistant     CEFEPIME >=32 RESISTANT Resistant     CEFTRIAXONE >=64 RESISTANT Resistant     CIPROFLOXACIN >=4 RESISTANT Resistant     GENTAMICIN >=16 RESISTANT Resistant     IMIPENEM <=0.25 SENSITIVE Sensitive     NITROFURANTOIN 128 RESISTANT Resistant     TRIMETH/SULFA >=320 RESISTANT Resistant     AMPICILLIN/SULBACTAM >=32 RESISTANT Resistant     PIP/TAZO 16 SENSITIVE Sensitive     * >=100,000 COLONIES/mL ESCHERICHIA COLI  Culture, blood (x 2)     Status: None (Preliminary result)   Collection Time: 01/11/22  7:48 AM   Specimen: BLOOD  Result Value Ref Range Status   Specimen Description   Final    BLOOD BLOOD RIGHT HAND Performed at [facility], [address] [place], [place] [address]    Special Requests   Final    BOTTLES DRAWN AEROBIC AND ANAEROBIC Blood Culture results may not be optimal due to an inadequate volume  of blood received in culture bottles Performed at [facility], [address], [place], [place] [address]    Culture   Final    NO GROWTH 3 DAYS Performed at [facility], [address], [place], [place] [address]    Report Status PENDING  Incomplete  Culture, blood (x 2)     Status: None (Preliminary result)   Collection Time: 01/11/22  7:58 AM   Specimen: BLOOD  Result Value Ref Range Status   Specimen Description   Final    BLOOD BLOOD RIGHT HAND Performed at [facility], [place] [address] [place], [place] [address]    Special Requests   Final    BOTTLES DRAWN AEROBIC AND ANAEROBIC Blood Culture results may not be optimal due to an inadequate volume of blood received in culture bottles Performed at [facility], [address] [place], [place] [address]    Culture   Final    NO GROWTH 3 DAYS Performed at [facility], [address], [place],  [address]    Report Status PENDING  Incomplete  MRSA Next Gen by PCR, Nasal     Status: Abnormal   Collection Time: 01/11/22 11:02 AM   Specimen: Nasal Mucosa; Nasal Swab  Result Value Ref Range Status   MRSA by PCR Next Gen DETECTED (A) NOT DETECTED Final    Comment: RESULT CALLED TO, READ BACK BY AND VERIFIED WITH: DANNY GREBER @ 9924 ON 01/11/22 C VARNER (NOTE) The GeneXpert MRSA Assay (FDA approved for NASAL specimens only), is one component of a comprehensive MRSA colonization surveillance program. It is not intended to diagnose MRSA infection nor to guide or monitor treatment for MRSA infections. Test performance is not FDA approved in patients less than 69 years old. Performed at [facility], [address] [place],  [address]   Culture, blood (Routine X  2) w Reflex to ID Panel     Status: None (Preliminary result)   Collection Time: 01/12/22  1:53 AM   Specimen: BLOOD RIGHT FOREARM  Result Value Ref Range Status   Specimen Description  BLOOD RIGHT FOREARM  Final   Special Requests   Final    BOTTLES DRAWN AEROBIC ONLY Blood Culture adequate volume   Culture   Final    NO GROWTH 2 DAYS Performed at [facility], [address] [place], [place] [address]    Report Status PENDING  Incomplete  Culture, blood (Routine X 2) w Reflex to ID Panel     Status: None (Preliminary result)   Collection Time: 01/12/22  1:53 AM   Specimen: Right Antecubital; Blood  Result Value Ref Range Status   Specimen Description RIGHT ANTECUBITAL  Final   Special Requests   Final    BOTTLES DRAWN AEROBIC AND ANAEROBIC Blood Culture results may not be optimal due to an excessive volume of blood received in culture bottles   Culture   Final    NO GROWTH 2 DAYS Performed at [facility], [address], [place], [place] [address]    Report Status PENDING  Incomplete    Radiology Reports MR ANKLE LEFT WO CONTRAST  Result Date: 01/13/2022 CLINICAL DATA:  Possible osteomyelitis. EXAM: MRI OF THE LEFT ANKLE WITHOUT CONTRAST TECHNIQUE: Multiplanar, multisequence MR imaging of the ankle was performed. No intravenous contrast was administered. COMPARISON:  Radiographs dated January 13, 2022 FINDINGS: TENDONS Peroneal: Peroneal longus tendon intact. Peroneal brevis intact. Posteromedial: Posterior tibial tendon intact. Flexor hallucis longus tendon intact. Flexor digitorum longus tendon intact. Anterior: Tibialis anterior tendon intact. Extensor hallucis longus tendon intact Extensor digitorum longus tendon intact. Achilles:  Intact. Plantar Fascia: Intact. LIGAMENTS Lateral: Anterior talofibular ligament intact. Calcaneofibular ligament intact. Posterior talofibular ligament intact. Anterior and posterior tibiofibular ligaments intact. Medial: Deltoid ligament intact. Spring ligament intact. CARTILAGE Ankle Joint: Small joint effusion. Normal ankle mortise. Subchondral cystic changes of the lateral aspect of the talar dome. Mild tibiotalar osteoarthritis. Subtalar  Joints/Sinus Tarsi: Normal subtalar joints. No subtalar joint effusion. Normal sinus tarsi. Mild talonavicular osteoarthritis. Bones: Mild edema of the lateral aspect of the lateral malleolus, in the area of concern which in the presence of adjacent skin wound is concerning for osteomyelitis. No fracture or dislocation. Soft Tissue: Mild skin thickening and subcutaneous soft tissue edema. No fluid collection or abscess. No fluid collection or hematoma. Muscles are normal without edema or atrophy. Tarsal tunnel is normal. IMPRESSION: 1. Bone marrow edema of the lateral malleolus, which in the presence of adjacent skin wound is concerning for osteomyelitis. 2. Mild tibiotalar osteoarthritis with focal subchondral cystic changes of the lateral aspect of the talar dome. Small joint effusion. 3.  Mild talonavicular osteoarthritis. 4.  Ligaments and tendons appear maintained. Electronically Signed   By: Keane Police D.O.   On: 01/13/2022 17:12   MR ANKLE RIGHT WO CONTRAST  Result Date: 01/13/2022 CLINICAL DATA:  Possible osteomyelitis. EXAM: MRI OF THE RIGHT ANKLE WITHOUT CONTRAST TECHNIQUE: Multiplanar, multisequence MR imaging of the ankle was performed. No intravenous contrast was administered. COMPARISON:  Radiographs dated January 13, 2022 FINDINGS: TENDONS Peroneal: Peroneal longus tendon intact. Peroneal brevis intact. Posteromedial: Posterior tibial tendon intact with mild thickening suggesting tendinosis. Flexor hallucis longus tendon intact. Flexor digitorum longus tendon intact. Anterior: Tibialis anterior tendon intact. Extensor hallucis longus tendon intact Extensor digitorum longus tendon intact. Achilles:  Intact. Plantar Fascia: Intact. LIGAMENTS Lateral: Anterior talofibular ligament intact.  Calcaneofibular ligament intact. Posterior talofibular ligament intact. Anterior and posterior tibiofibular ligaments intact. Medial: Deltoid ligament intact. Spring ligament intact. CARTILAGE Ankle Joint: Small  joint effusion. Complete loss of the articular cartilage with subchondral cystic changes of the talus and the tibial plafond. Prominent osteophytes about the ankle joint consistent with advanced osteoarthritis. Subtalar Joints/Sinus Tarsi: Normal subtalar joints. No subtalar joint effusion. Normal sinus tarsi. Moderate osteoarthritis of the talonavicular joint with subchondral edema and marginal osteophytes. Bones: Susceptibility artifacts from the surgical hardware in the calcaneus and the fifth metatarsal limits evaluation. Bone marrow edema of the lateral malleolus. Soft Tissue: Mild soft tissue edema and skin thickening about the ankle. No fluid collection. IMPRESSION: 1. Bone marrow edema of the lateral malleolus, which is likely reactive secondary to advanced osteoarthritis and altered mechanics. If there is adjacent skin wound osteomyelitis can not be excluded. 2. Advanced osteoarthritis of the tibiotalar joint, with complete loss of the articular cartilage, subchondral cystic changes and prominent osteophytes. 3.  Moderate osteoarthritis of the talonavicular joint. 4. Tendons of the flexor, extensor and peroneal compartments appear maintained. 5.  No acute ligamentous injury. Electronically Signed   By: Keane Police D.O.   On: 01/13/2022 16:57   DG Ankle Left Port  Result Date: 01/13/2022 CLINICAL DATA:  Osteomyelitis EXAM: PORTABLE LEFT ANKLE - 2 VIEW COMPARISON:  None Available. FINDINGS: Osteopenia. No fracture or dislocation of the left ankle. Mild ankle mortise and midfoot arthrosis. Extensive vascular calcinosis. IMPRESSION: Osteopenia. No fracture or dislocation of the left ankle. Mild ankle mortise and midfoot arthrosis. No radiographic evidence of osteomyelitis. MRI is the test of choice to evaluate for osteomyelitis and bone marrow edema if suspected. Electronically Signed   By: Delanna Ahmadi M.D.   On: 01/13/2022 12:45   DG Ankle Right Port  Result Date: 01/13/2022 CLINICAL DATA:  Soft  tissue wounds. EXAM: PORTABLE RIGHT ANKLE - 2 VIEW COMPARISON:  None Available. FINDINGS: Surgical staples are noted in the calcaneus. Status post surgical fixation of proximal fifth metatarsal. There degenerative change of the talotibial joint is noted. Vascular calcifications are noted. Extensive osteophyte formation is noted. Cortical irregularity may be involving the lateral portion of the talus; osteomyelitis cannot be excluded. IMPRESSION: Cortical irregularity is seen involving the lateral portion of the talus; osteomyelitis cannot be excluded. Extensive degenerative changes are noted involving the talotibial joint as well as postsurgical changes involving the calcaneus. Electronically Signed   By: Marijo Conception M.D.   On: 01/13/2022 12:43    SIGNED: Deatra James, MD, FHM. > 66 min of critical time was spent seeing evaluating patient, stabilizing patient, discussing plan of care with ICU staff and consultants.  Plan of care and managing hypertension, sepsis, septic shock   [facility],  Pager (please use [email or web address] to page/text) Please use Epic Secure Chat for non-urgent communication (7AM-7PM)  If 7PM-7AM, please contact night-coverage [email or web address], 01/14/2022, 11:19 AM

## 2022-01-14 NOTE — Procedures (Signed)
   HEMODIALYSIS TREATMENT NOTE:   4 hour treatment completed.  Albumin given at start for SBP<100.  Hemodynamically stable after that.  Goal met: 3L removed.  All blood was returned. No changes from pre-HD assessment.  Rockwell Alexandria, RN

## 2022-01-15 DIAGNOSIS — L03119 Cellulitis of unspecified part of limb: Secondary | ICD-10-CM | POA: Diagnosis not present

## 2022-01-15 DIAGNOSIS — M86672 Other chronic osteomyelitis, left ankle and foot: Secondary | ICD-10-CM

## 2022-01-15 DIAGNOSIS — M86671 Other chronic osteomyelitis, right ankle and foot: Secondary | ICD-10-CM

## 2022-01-15 DIAGNOSIS — R6521 Severe sepsis with septic shock: Secondary | ICD-10-CM | POA: Diagnosis not present

## 2022-01-15 DIAGNOSIS — A419 Sepsis, unspecified organism: Secondary | ICD-10-CM | POA: Diagnosis not present

## 2022-01-15 DIAGNOSIS — E669 Obesity, unspecified: Secondary | ICD-10-CM | POA: Diagnosis not present

## 2022-01-15 LAB — BASIC METABOLIC PANEL
Anion gap: 9 (ref 5–15)
BUN: 36 mg/dL — ABNORMAL HIGH (ref 8–23)
CO2: 27 mmol/L (ref 22–32)
Calcium: 7.7 mg/dL — ABNORMAL LOW (ref 8.9–10.3)
Chloride: 95 mmol/L — ABNORMAL LOW (ref 98–111)
Creatinine, Ser: 3.11 mg/dL — ABNORMAL HIGH (ref 0.61–1.24)
GFR, Estimated: 21 mL/min — ABNORMAL LOW (ref >60)
Glucose, Bld: 154 mg/dL — ABNORMAL HIGH (ref 70–99)
Potassium: 4.5 mmol/L (ref 3.5–5.1)
Sodium: 131 mmol/L — ABNORMAL LOW (ref 135–145)

## 2022-01-15 LAB — CBC
HCT: 26.6 % — ABNORMAL LOW (ref 39.0–52.0)
Hemoglobin: 8.5 g/dL — ABNORMAL LOW (ref 13.0–17.0)
MCH: 26.2 pg (ref 26.0–34.0)
MCHC: 32 g/dL (ref 30.0–36.0)
MCV: 82.1 fL (ref 80.0–100.0)
Platelets: 279 10*3/uL (ref 150–400)
RBC: 3.24 MIL/uL — ABNORMAL LOW (ref 4.22–5.81)
RDW: 19.9 % — ABNORMAL HIGH (ref 11.5–15.5)
WBC: 10.4 10*3/uL (ref 4.0–10.5)
nRBC: 0.9 % — ABNORMAL HIGH (ref 0.0–0.2)

## 2022-01-15 LAB — GLUCOSE, CAPILLARY
Glucose-Capillary: 165 mg/dL — ABNORMAL HIGH (ref 70–99)
Glucose-Capillary: 186 mg/dL — ABNORMAL HIGH (ref 70–99)
Glucose-Capillary: 147 mg/dL — ABNORMAL HIGH (ref 70–99)
Glucose-Capillary: 150 mg/dL — ABNORMAL HIGH (ref 70–99)
Glucose-Capillary: 151 mg/dL — ABNORMAL HIGH (ref 70–99)

## 2022-01-15 NOTE — Consult Note (Signed)
Reason for Consult: Bilateral ankle infections Referring Physician: Skipper Cliche  Danny [name] is an 66 y.o. male.  HPI: 66 year old male admitted to the ICU for sepsis in the course of his work-up it was suspected that he might have an infection in his ankles radiographs and MRIs were done  Apparently he had had a right ankle calcaneus procedure by Dr. Tamera Punt in the past  He says he was in the hospital at least in the ER in April and they told him he had infection in his ankles  He now has wounds over his right and left fibula he has deformity of his right and left lower extremities and that they are externally rotated keeping his lateral ankle against the bed causing pressure he right side is definitely worse than the left  Past Medical History:  Diagnosis Date   Anemia    Anemia in ESRD (end-stage renal disease) ([place]) 11/28/2021   Ankle ulcer ([place])    Arthritis    CKD (chronic kidney disease), stage V ([place])    Diabetes mellitus without complication ([place])    Diastolic congestive heart failure ([place])    Foot ulcer ([place])    Gout    Hypertension    Urinary retention     Past Surgical History:  Procedure Laterality Date   ANKLE SURGERY Right    AV FISTULA PLACEMENT Left 04/26/2021   Procedure: LEFT ARM ARTERIOVENOUS (AV) FISTULA CREATION (BRACHIOCEPHALIC);  Surgeon: Rosetta Posner, MD;  Location: [facility];  Service: Vascular;  Laterality: Left;   CHOLECYSTECTOMY     FISTULA SUPERFICIALIZATION Left 07/12/2021   Procedure: LEFT ARM ARTERIOVENOUS FISTULA SUPERFICIALIZATION;  Surgeon: Rosetta Posner, MD;  Location: [facility];  Service: Vascular;  Laterality: Left;   FOOT SURGERY Right    INSERTION OF DIALYSIS CATHETER Right 01/14/2021   Procedure: INSERTION OF TUNNELED DIALYSIS CATHETER RIGHT INTERNAL JUGULAR;  Surgeon: Virl Cagey, MD;  Location: [facility];  Service: General;  Laterality: Right;   REMOVAL OF A DIALYSIS CATHETER N/A 09/06/2021   Procedure: MINOR REMOVAL OF TUNNELED  DIALYSIS CATHETER;  Surgeon: Rosetta Posner, MD;  Location: [facility];  Service: Vascular;  Laterality: N/A;   TRANSURETHRAL RESECTION OF PROSTATE N/A 01/15/2020   Procedure: TRANSURETHRAL RESECTION OF THE PROSTATE (TURP)  with General anesthesia and spinal;  Surgeon: Cleon Gustin, MD;  Location: [facility];  Service: Urology;  Laterality: N/A;    Family History  Problem Relation Age of Onset   Diabetes Mother    Heart attack Mother    Heart attack Father    Diabetes Brother     Social History:  reports that he quit smoking about 34 years ago. His smoking use included cigarettes. He has a 15.00 pack-year smoking history. He has been exposed to tobacco smoke. He has never used smokeless tobacco. He reports that he does not currently use alcohol. He reports that he does not use drugs.  Allergies:  Allergies  Allergen Reactions   Dust Mite Extract Itching and Other (See Comments)    Unknown reaction-potential shortness of breath   Prednisone Nausea And Vomiting   Rocephin [Ceftriaxone] Nausea And Vomiting    Medications: I have reviewed the patient's current medications.  Results for orders placed or performed during the hospital encounter of 01/10/22 (from the past 48 hour(s))  Glucose, capillary     Status: Abnormal   Collection Time: 01/13/22 11:33 AM  Result Value Ref Range   Glucose-Capillary 137 (H) 70 - 99 mg/dL  Comment: Glucose reference range applies only to samples taken after fasting for at least 8 hours.  Glucose, capillary     Status: Abnormal   Collection Time: 01/13/22  3:28 PM  Result Value Ref Range   Glucose-Capillary 175 (H) 70 - 99 mg/dL    Comment: Glucose reference range applies only to samples taken after fasting for at least 8 hours.  Glucose, capillary     Status: Abnormal   Collection Time: 01/13/22  4:48 PM  Result Value Ref Range   Glucose-Capillary 175 (H) 70 - 99 mg/dL    Comment: Glucose reference range applies only to samples taken after fasting  for at least 8 hours.  Glucose, capillary     Status: Abnormal   Collection Time: 01/13/22  9:03 PM  Result Value Ref Range   Glucose-Capillary 190 (H) 70 - 99 mg/dL    Comment: Glucose reference range applies only to samples taken after fasting for at least 8 hours.  CBC     Status: Abnormal   Collection Time: 01/14/22  2:30 AM  Result Value Ref Range   WBC 11.3 (H) 4.0 - 10.5 K/uL   RBC 3.27 (L) 4.22 - 5.81 MIL/uL   Hemoglobin 8.7 (L) 13.0 - 17.0 g/dL   HCT 27.1 (L) 39.0 - 52.0 %   MCV 82.9 80.0 - 100.0 fL   MCH 26.6 26.0 - 34.0 pg   MCHC 32.1 30.0 - 36.0 g/dL   RDW 19.9 (H) 11.5 - 15.5 %   Platelets 267 150 - 400 K/uL   nRBC 0.7 (H) 0.0 - 0.2 %    Comment: Performed at [facility], [address], [place], [place] [address]  Comprehensive metabolic panel     Status: Abnormal   Collection Time: 01/14/22  2:30 AM  Result Value Ref Range   Sodium 129 (L) 135 - 145 mmol/L   Potassium 5.4 (H) 3.5 - 5.1 mmol/L    Comment: DELTA CHECK NOTED   Chloride 97 (L) 98 - 111 mmol/L   CO2 22 22 - 32 mmol/L   Glucose, Bld 165 (H) 70 - 99 mg/dL    Comment: Glucose reference range applies only to samples taken after fasting for at least 8 hours.   BUN 54 (H) 8 - 23 mg/dL   Creatinine, Ser 4.24 (H) 0.61 - 1.24 mg/dL   Calcium 7.5 (L) 8.9 - 10.3 mg/dL   Total Protein 6.0 (L) 6.5 - 8.1 g/dL   Albumin 1.6 (L) 3.5 - 5.0 g/dL   AST 37 15 - 41 U/L   ALT 27 0 - 44 U/L   Alkaline Phosphatase 198 (H) 38 - 126 U/L   Total Bilirubin 1.8 (H) 0.3 - 1.2 mg/dL   GFR, Estimated 14 (L) >60 mL/min    Comment: (NOTE) Calculated using the CKD-EPI Creatinine Equation (2021)    Anion gap 10 5 - 15    Comment: Performed at [facility], [address] [place], [place] [address]  Sedimentation rate     Status: Abnormal   Collection Time: 01/14/22  2:30 AM  Result Value Ref Range   Sed Rate 55 (H) 0 - 16 mm/hr    Comment: Performed at [facility], [address], [place], [place] [address]  C-reactive  protein     Status: Abnormal   Collection Time: 01/14/22  2:30 AM  Result Value Ref Range   CRP 13.7 (H) <1.0 mg/dL    Comment: Performed at [facility], [place]  [address], [place], [place] [address]  Glucose, capillary     Status: Abnormal   Collection Time: 01/14/22  7:51 AM  Result Value Ref Range   Glucose-Capillary 140 (H) 70 - 99 mg/dL    Comment: Glucose reference range applies only to samples taken after fasting for at least 8 hours.  Glucose, capillary     Status: Abnormal   Collection Time: 01/14/22 11:32 AM  Result Value Ref Range   Glucose-Capillary 138 (H) 70 - 99 mg/dL    Comment: Glucose reference range applies only to samples taken after fasting for at least 8 hours.  Glucose, capillary     Status: Abnormal   Collection Time: 01/14/22  4:37 PM  Result Value Ref Range   Glucose-Capillary 111 (H) 70 - 99 mg/dL    Comment: Glucose reference range applies only to samples taken after fasting for at least 8 hours.  Glucose, capillary     Status: Abnormal   Collection Time: 01/14/22  9:16 PM  Result Value Ref Range   Glucose-Capillary 129 (H) 70 - 99 mg/dL    Comment: Glucose reference range applies only to samples taken after fasting for at least 8 hours.  CBC     Status: Abnormal   Collection Time: 01/15/22  4:35 AM  Result Value Ref Range   WBC 10.4 4.0 - 10.5 K/uL   RBC 3.24 (L) 4.22 - 5.81 MIL/uL   Hemoglobin 8.5 (L) 13.0 - 17.0 g/dL   HCT 26.6 (L) 39.0 - 52.0 %   MCV 82.1 80.0 - 100.0 fL   MCH 26.2 26.0 - 34.0 pg   MCHC 32.0 30.0 - 36.0 g/dL   RDW 19.9 (H) 11.5 - 15.5 %   Platelets 279 150 - 400 K/uL   nRBC 0.9 (H) 0.0 - 0.2 %    Comment: Performed at [facility], [address], [place], [place] [address]  Basic metabolic panel     Status: Abnormal   Collection Time: 01/15/22  4:35 AM  Result Value Ref Range   Sodium 131 (L) 135 - 145 mmol/L   Potassium 4.5 3.5 - 5.1 mmol/L   Chloride 95 (L) 98 - 111 mmol/L   CO2 27 22 - 32 mmol/L   Glucose, Bld 154  (H) 70 - 99 mg/dL    Comment: Glucose reference range applies only to samples taken after fasting for at least 8 hours.   BUN 36 (H) 8 - 23 mg/dL   Creatinine, Ser 3.11 (H) 0.61 - 1.24 mg/dL   Calcium 7.7 (L) 8.9 - 10.3 mg/dL   GFR, Estimated 21 (L) >60 mL/min    Comment: (NOTE) Calculated using the CKD-EPI Creatinine Equation (2021)    Anion gap 9 5 - 15    Comment: Performed at [facility], [address], [place], [place] [address]  Glucose, capillary     Status: Abnormal   Collection Time: 01/15/22  7:32 AM  Result Value Ref Range   Glucose-Capillary 165 (H) 70 - 99 mg/dL    Comment: Glucose reference range applies only to samples taken after fasting for at least 8 hours.   My interpretation of this left MRI is that he has bone marrow edema in the fibula consistent with osteomyelitis he has severe osteoarthritis of the foot and ankle   My interpretation of the right ankle MRI is the same with bone marrow edema lateral malleolus extensive ankle arthritis  The right and left ankle x-ray also confirmed that he has arthritis of the ankle  and subtalar joints as well as hardware in the right ankle (surgical staples in the calcaneus which look like may be a calcaneal slide)  MR ANKLE LEFT WO CONTRAST  Result Date: 01/13/2022 CLINICAL DATA:  Possible osteomyelitis. EXAM: MRI OF THE LEFT ANKLE WITHOUT CONTRAST TECHNIQUE: Multiplanar, multisequence MR imaging of the ankle was performed. No intravenous contrast was administered. COMPARISON:  Radiographs dated January 13, 2022 FINDINGS: TENDONS Peroneal: Peroneal longus tendon intact. Peroneal brevis intact. Posteromedial: Posterior tibial tendon intact. Flexor hallucis longus tendon intact. Flexor digitorum longus tendon intact. Anterior: Tibialis anterior tendon intact. Extensor hallucis longus tendon intact Extensor digitorum longus tendon intact. Achilles:  Intact. Plantar Fascia: Intact. LIGAMENTS Lateral: Anterior talofibular ligament intact.  Calcaneofibular ligament intact. Posterior talofibular ligament intact. Anterior and posterior tibiofibular ligaments intact. Medial: Deltoid ligament intact. Spring ligament intact. CARTILAGE Ankle Joint: Small joint effusion. Normal ankle mortise. Subchondral cystic changes of the lateral aspect of the talar dome. Mild tibiotalar osteoarthritis. Subtalar Joints/Sinus Tarsi: Normal subtalar joints. No subtalar joint effusion. Normal sinus tarsi. Mild talonavicular osteoarthritis. Bones: Mild edema of the lateral aspect of the lateral malleolus, in the area of concern which in the presence of adjacent skin wound is concerning for osteomyelitis. No fracture or dislocation. Soft Tissue: Mild skin thickening and subcutaneous soft tissue edema. No fluid collection or abscess. No fluid collection or hematoma. Muscles are normal without edema or atrophy. Tarsal tunnel is normal. IMPRESSION: 1. Bone marrow edema of the lateral malleolus, which in the presence of adjacent skin wound is concerning for osteomyelitis. 2. Mild tibiotalar osteoarthritis with focal subchondral cystic changes of the lateral aspect of the talar dome. Small joint effusion. 3.  Mild talonavicular osteoarthritis. 4.  Ligaments and tendons appear maintained. Electronically Signed   By: Keane Police D.O.   On: 01/13/2022 17:12   MR ANKLE RIGHT WO CONTRAST  Result Date: 01/13/2022 CLINICAL DATA:  Possible osteomyelitis. EXAM: MRI OF THE RIGHT ANKLE WITHOUT CONTRAST TECHNIQUE: Multiplanar, multisequence MR imaging of the ankle was performed. No intravenous contrast was administered. COMPARISON:  Radiographs dated January 13, 2022 FINDINGS: TENDONS Peroneal: Peroneal longus tendon intact. Peroneal brevis intact. Posteromedial: Posterior tibial tendon intact with mild thickening suggesting tendinosis. Flexor hallucis longus tendon intact. Flexor digitorum longus tendon intact. Anterior: Tibialis anterior tendon intact. Extensor hallucis longus tendon  intact Extensor digitorum longus tendon intact. Achilles:  Intact. Plantar Fascia: Intact. LIGAMENTS Lateral: Anterior talofibular ligament intact. Calcaneofibular ligament intact. Posterior talofibular ligament intact. Anterior and posterior tibiofibular ligaments intact. Medial: Deltoid ligament intact. Spring ligament intact. CARTILAGE Ankle Joint: Small joint effusion. Complete loss of the articular cartilage with subchondral cystic changes of the talus and the tibial plafond. Prominent osteophytes about the ankle joint consistent with advanced osteoarthritis. Subtalar Joints/Sinus Tarsi: Normal subtalar joints. No subtalar joint effusion. Normal sinus tarsi. Moderate osteoarthritis of the talonavicular joint with subchondral edema and marginal osteophytes. Bones: Susceptibility artifacts from the surgical hardware in the calcaneus and the fifth metatarsal limits evaluation. Bone marrow edema of the lateral malleolus. Soft Tissue: Mild soft tissue edema and skin thickening about the ankle. No fluid collection. IMPRESSION: 1. Bone marrow edema of the lateral malleolus, which is likely reactive secondary to advanced osteoarthritis and altered mechanics. If there is adjacent skin wound osteomyelitis can not be excluded. 2. Advanced osteoarthritis of the tibiotalar joint, with complete loss of the articular cartilage, subchondral cystic changes and prominent osteophytes. 3.  Moderate osteoarthritis of the talonavicular joint. 4. Tendons of the flexor, extensor and peroneal compartments appear  maintained. 5.  No acute ligamentous injury. Electronically Signed   By: Keane Police D.O.   On: 01/13/2022 16:57   DG Ankle Left Port  Result Date: 01/13/2022 CLINICAL DATA:  Osteomyelitis EXAM: PORTABLE LEFT ANKLE - 2 VIEW COMPARISON:  None Available. FINDINGS: Osteopenia. No fracture or dislocation of the left ankle. Mild ankle mortise and midfoot arthrosis. Extensive vascular calcinosis. IMPRESSION: Osteopenia. No  fracture or dislocation of the left ankle. Mild ankle mortise and midfoot arthrosis. No radiographic evidence of osteomyelitis. MRI is the test of choice to evaluate for osteomyelitis and bone marrow edema if suspected. Electronically Signed   By: Delanna Ahmadi M.D.   On: 01/13/2022 12:45   DG Ankle Right Port  Result Date: 01/13/2022 CLINICAL DATA:  Soft tissue wounds. EXAM: PORTABLE RIGHT ANKLE - 2 VIEW COMPARISON:  None Available. FINDINGS: Surgical staples are noted in the calcaneus. Status post surgical fixation of proximal fifth metatarsal. There degenerative change of the talotibial joint is noted. Vascular calcifications are noted. Extensive osteophyte formation is noted. Cortical irregularity may be involving the lateral portion of the talus; osteomyelitis cannot be excluded. IMPRESSION: Cortical irregularity is seen involving the lateral portion of the talus; osteomyelitis cannot be excluded. Extensive degenerative changes are noted involving the talotibial joint as well as postsurgical changes involving the calcaneus. Electronically Signed   By: Marijo Conception M.D.   On: 01/13/2022 12:43    Review of Systems malaise, fever, decreased activity, joint pain, difficulty ambulating   Blood pressure 125/63, pulse 80, temperature (!) 97.1 F (36.2 C), temperature source Axillary, resp. rate 19, height '6\' 1"'$  (1.854 m), weight 119.4 kg, SpO2 98 %. Physical Exam  Constitutional: General appearance, large male most notable is that his hips are externally rotated chronically which puts pressure on the lateral side of both right and left ankle  Cardiovascular he has skin changes consistent with chronic venous stasis disease his foot is warm to touch she has a palpable dorsalis pedis pulse  There is edema and tenderness in the lower extremities with increased erythema in the pretibial regions  Skin  Right ankle large pressure sore appears to be superficial, left ankle smaller sore also  superficial  Neuro pressure sensation is intact pain sensation is intact position sensation intact  Psych alert and oriented x3, no depression anxiety or agitation  Musculoskeletal gait not assessed patient lying in bed been in the ICU for couple of days  Right ankle again 3 x 5 cm pressure sore over the lateral malleolus appears to be superficial ankle range of motion is limited seems to be chronic muscle tone is normal no tremor ankle is stable  Left ankle I can 2 x 3 cm pressure sore lateral malleolus with erythema surrounding ankle motion is intact but limited seems to be chronic as well muscle tone normal no tremor ankle is stable    Assessment/Plan: Bilateral ankle osteomyelitis with pressure sores seem to be superficial  Chronic arthritis ankle and subtalar joint right and left ankle  I will consult with Dr. Sharol Given regarding definitive management  Arther Abbott 01/15/2022, 10:10 AM

## 2022-01-15 NOTE — Progress Notes (Signed)
PROGRESS NOTE    Patient: Danny [name]                            PCP: Celene Squibb, MD                    DOB: [birth year]            DOA: 01/10/2022 ZTI:[id number]             DOS: 01/15/2022, 11:01 AM   LOS: 5 days   Date of Service: The patient was seen and examined on 01/15/2022  Subjective:   The patient was seen and examined this morning, awake alert oriented, hemodynamically stable Complaint is persistent nausea with this few episodes of vomiting Per nursing staff has been tolerating p.o.  MRI of the lower extremity could not rule out osteomyelitis, revealed severe osteoarthritis   Brief Narrative:   Danny [name] is a 66 y.o. male with medical history significant of hypertension, ESRD (TTS), A-fib on Eliquis who presents to the emergency department via EMS from a nursing facility due to fever and weakness.   Patient states that he went to dialysis this morning, but no fluid was taken off him. Unable to provide further history, history was obtained from the ED physician and ED medical record.  Per report, patient was found to be hypoxic with O2 sat in the 80s, supplemental oxygen via [place] at 4 LPM was provided.  Patient was reported to be weak and confused today (different from baseline, good sense of humor, AOx3), he was reported to be fine during shift signout at the nursing facility.       ED Course:  Tmax of 102.50F, tachypneic, tachycardic.  Work-up in the ED showed normal CBC except for WBC of 10.6 BMP showed sodium 133, potassium 4.2, chloride 96, bicarb 26, glucose 129, BUN 55, creatinine 4.34, calcium 7.7, albumin 2.0, alkaline phosphatase 187, AST 22, ALT 18, total bilirubin 2.2, eGFR 14, lactic acid 2.0, sed rate 100.  Urinalysis was positive for large leukocytes, > 50 WBC, and many bacteria.  Influenza A, B, SARS coronavirus 2 was negative.  Blood culture pending. Chest x-ray showed possible small pleural effusions.  No overt interstitial edema. He was treated with IV  cefepime, Flagyl, vancomycin, Zofran was given, IV hydration was provided.  Tylenol was given due to fever.  Patient's BP continued to be in hypotensive range, he was started on peripheral IV Levophed.   Hospitalist was asked to admit patient for further evaluation and management.    Assessment & Plan:   Principal Problem:   Septic shock ([place]) Active Problems:   Atrial fibrillation, chronic (HCC)   Cellulitis, leg   ESRD on dialysis ([place])   Acute metabolic encephalopathy   Iron deficiency anemia   Type 2 diabetes mellitus (HCC)   Obesity (BMI 30-39.9)   Mixed hyperlipidemia   Hypotension   Open wound of right foot   Lactic acidosis   Acute encephalopathy   Acute hypoxemic respiratory failure (HCC)   Severe sepsis (HCC)   Streptococcal bacteremia   Chronic osteomyelitis, ankle (HCC)   History of revision of total knee arthroplasty   Sepsis-septic shock Bacteremia: Streptococcus, UTI E. coli -Possible source of infection right leg cellulitis -versus bilateral ankle osteomyelitis  -Stable now -Sepsis/septic shock physiology improving -Off Levophed, status post IV fluid resuscitation, IV antibiotic  -Blood cultures are growing group B strep,  - Urine culture  growing E. Coli -ESBL-likely chronically colonized -Infectious disease consulted, recommended MRI of the lower extremities, continue current antibiotics of pen G -and to monitor for dysuria, discontinued antibiotic coverage for ESBL   -Discontinuing broad-spectrum antibiotics of vancomycin, Flagyl, cefepime discontinued on 01/12/2022 -Continuing penicillin G treatment for ID recommendation  Met sepsis septic shock criteria on admission-with encephalopathy, acute respiratory failure, hypotensive fever, tachycardia, tachypnea, leukocytosis -As needed Tylenol,  --Sepsis physiology resolved, hemodynamically stable, pressure and heart rate stabilized, will transfer out of ICU on 3/87/5643  Acute metabolic  encephalopathy -Resolved -- likely it was due to sepsis, septic shock -Continue treating underlying causes    Hypotension -acute on chronic  -Blood pressure remains soft but stable -Successfully weaned off Levophed -Continue home medication of midodrine    Right foot wound/bilateral ankle osteomyelitis Continue wound care -[place] lower extremity Doppler>>> No evidence of deep venous thrombosis in either lower extremity. -Bilateral lower extremity MRI, reporting significant inflammation, osteoarthritis, cannot rule out osteomyelitis -Orthopedic, Dr. Aline Brochure was consulted reviewed imaging, he will further consult Dr. Sharol Given for further evaluation and definitive management -He is optimistic as patient has been responding well to antibiotics   Lactic acidosis Lactic acid 2.0, >>3.0 >>2.4   continue to trend lactic acid  ESRD on HD (TTS) Patient states that he had dialysis today but no fluid was taken off him Continue [facility] consulted for maintenance dialysis -Planning for hemodialysis today 7/20, 7/22   A-fib on Eliquis Continue Eliquis Toprol-XL will be held at this time due to hypotension   Type 2 diabetes mellitus Continue ISS and hypoglycemia protocol   Iron deficiency anemia Continue ferrous sulfate Continue senna to prevent constipation  Mixed hyperlipidemia Continue statin Continue Lovaza   Obesity (BMI 32.66 kgm) Diet and lifestyle modification  Severe debility -Once stable PT OT will be consulted for evaluation and recommendation -Bedbound ----------------------------------------------------------------------------------------------------------------------------------------------- Nutritional status:  The patient's BMI is: Body mass index is 34.73 kg/m. I agree with the assessment and plan as outline  Skin Assessment: I have examined the patient's skin and I agree with the wound assessment as performed by wound care team As outlined belowe:     --------------------------------------------------------------------------------------------------------------------------------------------- Cultures; Blood Cultures x 2 >> group B strep Repeat blood cultures >>> up-to-date Urine Culture  >>> E. Coli -ESBL Sputum Culture >> does not been obtained  -------------------------------------------------------------------------------------------------------------------------------------------  DVT prophylaxis:  SCDs Start: 01/10/22 1933 apixaban (ELIQUIS) tablet 5 mg   Code Status:   Code Status: DNR  Family Communication: No family member present at bedside- attempt will be made to update daily The above findings and plan of care has been discussed with patient (and family)  in detail,  they expressed understanding and agreement of above. -Advance care planning has been discussed.   Admission status:   Status is: Inpatient Remains inpatient appropriate because: Needing treatment for sepsis, septic shock, IV fluids, IV antibiotics,     Procedures:   No admission procedures for hospital encounter.   Antimicrobials:  Anti-infectives (From admission, onward)    Start     Dose/Rate Route Frequency Ordered Stop   01/12/22 1600  vancomycin (VANCOCIN) IVPB 1000 mg/200 mL premix  Status:  Discontinued        1,000 mg 200 mL/hr over 60 Minutes Intravenous Every T-Th-Sa (Hemodialysis) 01/11/22 1300 01/12/22 1012   01/12/22 1600  penicillin G potassium 2 Million Units in dextrose 5 % 50 mL IVPB        2 Million Units 100 mL/hr over 30 Minutes Intravenous Every  4 hours 01/12/22 1013     01/12/22 1200  penicillin G potassium 4 Million Units in dextrose 5 % 250 mL IVPB        4 Million Units 250 mL/hr over 60 Minutes Intravenous  Once 01/12/22 1012 01/12/22 1304   01/12/22 1130  ciprofloxacin (CIPRO) tablet 500 mg  Status:  Discontinued       Note to Pharmacy: UTI - E-coli   500 mg Oral Every 24 hours 01/12/22 1118 01/13/22 1112    01/11/22 2200  ceFEPIme (MAXIPIME) 1 g in sodium chloride 0.9 % 100 mL IVPB  Status:  Discontinued        1 g 200 mL/hr over 30 Minutes Intravenous Every 24 hours 01/10/22 1815 01/12/22 1012   01/11/22 0715  metroNIDAZOLE (FLAGYL) IVPB 500 mg  Status:  Discontinued        500 mg 100 mL/hr over 60 Minutes Intravenous Every 12 hours 01/11/22 0714 01/12/22 1012   01/10/22 1730  ceFEPIme (MAXIPIME) 2 g in sodium chloride 0.9 % 100 mL IVPB        2 g 200 mL/hr over 30 Minutes Intravenous  Once 01/10/22 1719 01/10/22 1804   01/10/22 1730  vancomycin (VANCOREADY) IVPB 2000 mg/400 mL        2,000 mg 200 mL/hr over 120 Minutes Intravenous  Once 01/10/22 1720 01/10/22 2132   01/10/22 1715  aztreonam (AZACTAM) 2 g in sodium chloride 0.9 % 100 mL IVPB  Status:  Discontinued        2 g 200 mL/hr over 30 Minutes Intravenous  Once 01/10/22 1713 01/10/22 1719   01/10/22 1715  metroNIDAZOLE (FLAGYL) IVPB 500 mg        500 mg 100 mL/hr over 60 Minutes Intravenous  Once 01/10/22 1713 01/10/22 1912   01/10/22 1715  vancomycin (VANCOCIN) IVPB 1000 mg/200 mL premix  Status:  Discontinued        1,000 mg 200 mL/hr over 60 Minutes Intravenous  Once 01/10/22 1713 01/10/22 1720        Medication:   acidophilus  1 capsule Oral TID WC   apixaban  5 mg Oral BID   Chlorhexidine Gluconate Cloth  6 each Topical Q0600   Chlorhexidine Gluconate Cloth  6 each Topical Q0600   darbepoetin (ARANESP) injection - NON-DIALYSIS  100 mcg Subcutaneous Q Thu-1800   ferrous sulfate  325 mg Oral Daily   insulin aspart  0-6 Units Subcutaneous TID WC   insulin detemir  5 Units Subcutaneous Daily   metoprolol succinate  50 mg Oral Daily   midodrine  10 mg Oral BID   mupirocin ointment   Nasal BID   omega-3 acid ethyl esters  1 g Oral Daily   pantoprazole  40 mg Oral Daily   rosuvastatin  5 mg Oral QPM   senna  2 tablet Oral BID   sevelamer carbonate  800 mg Oral BID WC    acetaminophen **OR** acetaminophen, albumin  human, ALPRAZolam, levalbuterol, metoprolol tartrate, ondansetron **OR** ondansetron (ZOFRAN) IV, pentafluoroprop-tetrafluoroeth, sodium chloride   Objective:   Vitals:   01/15/22 0500 01/15/22 0600 01/15/22 0700 01/15/22 0737  BP: 129/73 126/67 125/63   Pulse: 85 84 80   Resp: 17 (!) 23 19   Temp:    (!) 97.1 F (36.2 C)  TempSrc:    Axillary  SpO2: 100% 98% 98%   Weight:      Height:        Intake/Output Summary (Last 24 hours) at 01/15/2022  1101 Last data filed at 01/15/2022 0710 Gross per 24 hour  Intake 350 ml  Output 3 ml  Net 347 ml    Filed Weights   01/12/22 0904 01/13/22 0500 01/15/22 0400  Weight: 111.1 kg 119.2 kg 119.4 kg     Examination:        Physical Exam:   General:  AAO x 3,  cooperative, no distress;   HEENT:  Normocephalic, PERRL, otherwise with in Normal limits   Neuro:  CNII-XII intact. , normal motor and sensation, reflexes intact   Lungs:   Clear to auscultation BL, Respirations unlabored,  No wheezes / crackles  Cardio:    S1/S2, RRR, No murmure, No Rubs or Gallops   Abdomen:  Soft, non-tender, bowel sounds active all four quadrants, no guarding or peritoneal signs.  Muscular  skeletal:  Limited exam -sever global generalized weaknesses Nonambulatory at baseline - in bed, able to move all 4 extremities,   2+ pulses,  symmetric, No pitting edema  Skin:  Dry, warm to touch, negative for any Rashes,  Wounds: Please see nursing documentation           -extremity wounds BL erythema, open wound right ateral ankle                        ------------------------------------------------------------------------------------------------------------------------------------------    LABs:     Latest Ref Rng & Units 01/15/2022    4:35 AM 01/14/2022    2:30 AM 01/13/2022    3:53 AM  CBC  WBC 4.0 - 10.5 K/uL 10.4  11.3  10.2   Hemoglobin 13.0 - 17.0 g/dL 8.5  8.7  7.8   Hematocrit 39.0 - 52.0 % 26.6  27.1  24.3   Platelets 150 -  400 K/uL 279  267  229       Latest Ref Rng & Units 01/15/2022    4:35 AM 01/14/2022    2:30 AM 01/13/2022    3:53 AM  CMP  Glucose 70 - 99 mg/dL 154  165  165   BUN 8 - 23 mg/dL 36  54  45   Creatinine 0.61 - 1.24 mg/dL 3.11  4.24  3.61   Sodium 135 - 145 mmol/L 131  129  131   Potassium 3.5 - 5.1 mmol/L 4.5  5.4  4.2   Chloride 98 - 111 mmol/L 95  97  98   CO2 22 - 32 mmol/L 27  22  26    Calcium 8.9 - 10.3 mg/dL 7.7  7.5  7.4   Total Protein 6.5 - 8.1 g/dL  6.0  5.6   Total Bilirubin 0.3 - 1.2 mg/dL  1.8  1.9   Alkaline Phos 38 - 126 U/L  198  180   AST 15 - 41 U/L  37  37   ALT 0 - 44 U/L  27  23        Micro Results Recent Results (from the past 240 hour(s))  Resp Panel by RT-PCR (Flu A&B, Covid) Anterior Nasal Swab     Status: None   Collection Time: 01/10/22  5:20 PM   Specimen: Anterior Nasal Swab  Result Value Ref Range Status   SARS Coronavirus 2 by RT PCR NEGATIVE NEGATIVE Final    Comment: (NOTE) SARS-CoV-2 target nucleic acids are NOT DETECTED.  The SARS-CoV-2 RNA is generally detectable in upper respiratory specimens during the acute phase of infection. The lowest concentration of SARS-CoV-2 viral copies this assay can detect  is 138 copies/mL. A negative result does not preclude SARS-Cov-2 infection and should not be used as the sole basis for treatment or other patient management decisions. A negative result may occur with  improper specimen collection/handling, submission of specimen other than nasopharyngeal swab, presence of viral mutation(s) within the areas targeted by this assay, and inadequate number of viral copies(<138 copies/mL). A negative result must be combined with clinical observations, patient history, and epidemiological information. The expected result is Negative.  Fact Sheet for Patients:  EntrepreneurPulse.com.au  Fact Sheet for Healthcare Providers:  IncredibleEmployment.be  This test is no t  yet approved or cleared by the [place] FDA and  has been authorized for detection and/or diagnosis of SARS-CoV-2 by FDA under an Emergency Use Authorization (EUA). This EUA will remain  in effect (meaning this test can be used) for the duration of the COVID-19 declaration under Section 564(b)(1) of the Act, 21 U.S.C.section 360bbb-3(b)(1), unless the authorization is terminated  or revoked sooner.       Influenza A by PCR NEGATIVE NEGATIVE Final   Influenza B by PCR NEGATIVE NEGATIVE Final    Comment: (NOTE) The Xpert Xpress SARS-CoV-2/FLU/RSV plus assay is intended as an aid in the diagnosis of influenza from Nasopharyngeal swab specimens and should not be used as a sole basis for treatment. Nasal washings and aspirates are unacceptable for Xpert Xpress SARS-CoV-2/FLU/RSV testing.  Fact Sheet for Patients: EntrepreneurPulse.com.au  Fact Sheet for Healthcare Providers: IncredibleEmployment.be  This test is not yet approved or cleared by the [place] FDA and has been authorized for detection and/or diagnosis of SARS-CoV-2 by FDA under an Emergency Use Authorization (EUA). This EUA will remain in effect (meaning this test can be used) for the duration of the COVID-19 declaration under Section 564(b)(1) of the Act, 21 U.S.C. section 360bbb-3(b)(1), unless the authorization is terminated or revoked.  Performed at [facility], [address], [place], [place] [address]   Blood Culture (routine x 2)     Status: Abnormal   Collection Time: 01/10/22  5:20 PM   Specimen: BLOOD RIGHT FOREARM  Result Value Ref Range Status   Specimen Description   Final    BLOOD RIGHT FOREARM Performed at [facility], [address], [place], [place] [address]    Special Requests   Final    BOTTLES DRAWN AEROBIC AND ANAEROBIC Blood Culture results may not be optimal due to an inadequate volume of blood received in culture bottles Performed at [facility], [address] [place], [place] [address]    Culture  Setup Time   Final    GRAM POSITIVE COCCI BOTTLES DRAWN AEROBIC AND ANAEROBIC Gram Stain Report Called to,Read Back By and Verified With: FERRAINLO,[email or web address]  BT MATTHEWS,B 7.19.2023 Performed at [facility], [address], [place], [place] [address]    Culture (A)  Final    GROUP B STREP(S.AGALACTIAE)ISOLATED SUSCEPTIBILITIES PERFORMED ON PREVIOUS CULTURE WITHIN THE LAST 5 DAYS. Performed at [facility], [place] [address], [place], [place] [address]    Report Status 01/13/2022 FINAL  Final  Blood Culture (routine x 2)     Status: Abnormal   Collection Time: 01/10/22  5:20 PM   Specimen: BLOOD  Result Value Ref Range Status   Specimen Description   Final    BLOOD RIGHT ANTECUBITAL Performed at [facility], [place] [address] [place], [place] [address]    Special Requests   Final    BOTTLES DRAWN AEROBIC AND ANAEROBIC Blood Culture adequate  volume Performed at [facility], [address], [place], [place] [address]    Culture  Setup Time   Final    GRAM POSITIVE COCCI BOTTLES DRAWN AEROBIC AND ANAEROBIC Gram Stain Report Called to,Read Back By and Verified With: FERRAINLO,[email or web address]  BY MAATHEWS,B 7.19.2023 Organism ID to follow CRITICAL RESULT CALLED TO, READ BACK BY AND VERIFIED WITH: Chucky May [place], AT [place] Performed at [facility], [place] [address] [place], [place] [address]    Culture GROUP B STREP(S.AGALACTIAE)ISOLATED (A)  Final   Report Status 01/13/2022 FINAL  Final   Organism ID, Bacteria GROUP B STREP(S.AGALACTIAE)ISOLATED  Final      Susceptibility   Group b strep(s.agalactiae)isolated - MIC*    CLINDAMYCIN <=0.25 SENSITIVE Sensitive     AMPICILLIN <=0.25 SENSITIVE Sensitive     ERYTHROMYCIN 0.5 INTERMEDIATE Intermediate     VANCOMYCIN 0.5 SENSITIVE Sensitive     CEFTRIAXONE <=0.12 SENSITIVE Sensitive     LEVOFLOXACIN 0.5 SENSITIVE Sensitive     PENICILLIN Value in next row Sensitive       SENSITIVE0.12    * GROUP B STREP(S.AGALACTIAE)ISOLATED  Blood Culture ID Panel (Reflexed)     Status: Abnormal   Collection Time: 01/10/22  5:20 PM  Result Value Ref Range Status   Enterococcus faecalis NOT DETECTED NOT DETECTED Final   Enterococcus Faecium NOT DETECTED NOT DETECTED Final   Listeria monocytogenes NOT DETECTED NOT DETECTED Final   Staphylococcus species NOT DETECTED NOT DETECTED Final   Staphylococcus aureus (BCID) NOT DETECTED NOT DETECTED Final   Staphylococcus epidermidis NOT DETECTED NOT DETECTED Final   Staphylococcus lugdunensis NOT DETECTED NOT DETECTED Final   Streptococcus species DETECTED (A) NOT DETECTED Final    Comment: CRITICAL RESULT CALLED TO, READ BACK BY AND VERIFIED WITH: G. COFFEE PHARMD, AT [phone] D. VANHOOK    Streptococcus agalactiae DETECTED (A) NOT DETECTED Final    Comment: CRITICAL RESULT CALLED TO, READ BACK BY AND VERIFIED WITH: G. COFFEE PHARMD, AT [phone] D. VANHOOK    Streptococcus pneumoniae NOT DETECTED NOT DETECTED Final   Streptococcus pyogenes NOT DETECTED NOT DETECTED Final   A.calcoaceticus-baumannii NOT DETECTED NOT DETECTED Final   Bacteroides fragilis NOT DETECTED NOT DETECTED Final   Enterobacterales NOT DETECTED NOT DETECTED Final   Enterobacter cloacae complex NOT DETECTED NOT DETECTED Final   Escherichia coli NOT DETECTED NOT DETECTED Final   Klebsiella aerogenes NOT DETECTED NOT DETECTED Final   Klebsiella oxytoca NOT DETECTED NOT DETECTED Final   Klebsiella pneumoniae NOT DETECTED NOT DETECTED Final   Proteus species NOT DETECTED NOT DETECTED Final   Salmonella species NOT DETECTED NOT DETECTED Final   Serratia marcescens NOT DETECTED NOT DETECTED Final   Haemophilus influenzae NOT DETECTED NOT DETECTED Final   Neisseria meningitidis NOT DETECTED NOT DETECTED Final   Pseudomonas aeruginosa NOT DETECTED NOT DETECTED Final   Stenotrophomonas maltophilia NOT DETECTED NOT DETECTED Final   Candida albicans NOT  DETECTED NOT DETECTED Final   Candida auris NOT DETECTED NOT DETECTED Final   Candida glabrata NOT DETECTED NOT DETECTED Final   Candida krusei NOT DETECTED NOT DETECTED Final   Candida parapsilosis NOT DETECTED NOT DETECTED Final   Candida tropicalis NOT DETECTED NOT DETECTED Final   Cryptococcus neoformans/gattii NOT DETECTED NOT DETECTED Final    Comment: Performed at [facility], [address], [place], [place] [address]  Urine Culture     Status: Abnormal   Collection Time: 01/10/22  5:48 PM  Specimen: Urine, Clean Catch  Result Value Ref Range Status   Specimen Description   Final    URINE, CLEAN CATCH Performed at [facility], [address], [place], [place] [address]    Special Requests   Final    NONE Performed at [facility], [address] [place], [place] [address]    Culture (A)  Final    >=100,000 COLONIES/mL ESCHERICHIA COLI Confirmed Extended Spectrum Beta-Lactamase Producer (ESBL).  In bloodstream infections from ESBL organisms, carbapenems are preferred over piperacillin/tazobactam. They are shown to have a lower risk of mortality.    Report Status 01/13/2022 FINAL  Final   Organism ID, Bacteria ESCHERICHIA COLI (A)  Final      Susceptibility   Escherichia coli - MIC*    AMPICILLIN >=32 RESISTANT Resistant     CEFAZOLIN >=64 RESISTANT Resistant     CEFEPIME >=32 RESISTANT Resistant     CEFTRIAXONE >=64 RESISTANT Resistant     CIPROFLOXACIN >=4 RESISTANT Resistant     GENTAMICIN >=16 RESISTANT Resistant     IMIPENEM <=0.25 SENSITIVE Sensitive     NITROFURANTOIN 128 RESISTANT Resistant     TRIMETH/SULFA >=320 RESISTANT Resistant     AMPICILLIN/SULBACTAM >=32 RESISTANT Resistant     PIP/TAZO 16 SENSITIVE Sensitive     * >=100,000 COLONIES/mL ESCHERICHIA COLI  Culture, blood (x 2)     Status: None (Preliminary result)   Collection Time: 01/11/22  7:48 AM   Specimen: BLOOD  Result Value Ref Range Status   Specimen Description   Final    BLOOD BLOOD  RIGHT HAND Performed at [facility], [address] [place], [place] [address]    Special Requests   Final    BOTTLES DRAWN AEROBIC AND ANAEROBIC Blood Culture results may not be optimal due to an inadequate volume of blood received in culture bottles Performed at [facility], [address], [place], [place] [address]    Culture   Final    NO GROWTH 4 DAYS Performed at [facility], [address] [place], [place] [address]    Report Status PENDING  Incomplete  Culture, blood (x 2)     Status: None (Preliminary result)   Collection Time: 01/11/22  7:58 AM   Specimen: BLOOD  Result Value Ref Range Status   Specimen Description   Final    BLOOD BLOOD RIGHT HAND Performed at [facility], [place] [address], [place], [place] [address]    Special Requests   Final    BOTTLES DRAWN AEROBIC AND ANAEROBIC Blood Culture results may not be optimal due to an inadequate volume of blood received in culture bottles Performed at [facility], [address] [place], [place] [address]    Culture   Final    NO GROWTH 4 DAYS Performed at [facility], [address] [place], [place] [address]    Report Status PENDING  Incomplete  MRSA Next Gen by PCR, Nasal     Status: Abnormal   Collection Time: 01/11/22 11:02 AM   Specimen: Nasal Mucosa; Nasal Swab  Result Value Ref Range Status   MRSA by PCR Next Gen DETECTED (A) NOT DETECTED Final    Comment: RESULT CALLED TO, READ BACK BY AND VERIFIED WITH: DANNY GREBER @ 9233 ON 01/11/22 C VARNER (NOTE) The GeneXpert MRSA Assay (FDA approved for NASAL specimens only), is one component of a comprehensive MRSA colonization surveillance program. It is not intended to diagnose  MRSA infection nor to guide or monitor treatment for MRSA infections. Test performance is not FDA approved in patients less than 1 years old. Performed at [facility], [address], [place], [place] [address]   Culture, blood (Routine X 2) w Reflex to ID Panel     Status: None (Preliminary result)   Collection Time: 01/12/22  1:53 AM   Specimen: BLOOD RIGHT FOREARM  Result Value Ref Range Status   Specimen Description BLOOD RIGHT FOREARM  Final   Special Requests   Final    BOTTLES DRAWN AEROBIC ONLY Blood Culture adequate volume   Culture   Final    NO GROWTH 3 DAYS Performed at [facility], [address], [place], [place] [address]    Report Status PENDING  Incomplete  Culture, blood (Routine X 2) w Reflex to ID Panel     Status: None (Preliminary result)   Collection Time: 01/12/22  1:53 AM   Specimen: Right Antecubital; Blood  Result Value Ref Range Status   Specimen Description RIGHT ANTECUBITAL  Final   Special Requests   Final    BOTTLES DRAWN AEROBIC AND ANAEROBIC Blood Culture results may not be optimal due to an excessive volume of blood received in culture bottles   Culture   Final    NO GROWTH 3 DAYS Performed at [facility], [address], [place],  [address]    Report Status PENDING  Incomplete    Radiology Reports No results found.  SIGNED: Deatra James, MD, FHM. > 66 min of critical time was spent seeing evaluating patient, stabilizing patient, discussing plan of care with ICU staff and consultants.  Plan of care and managing hypertension, sepsis, septic shock   [facility],  Pager (please use [email or web address] to page/text) Please use Epic Secure Chat for non-urgent communication (7AM-7PM)  If 7PM-7AM, please contact night-coverage [email or web address], 01/15/2022, 11:01 AM

## 2022-01-16 ENCOUNTER — Inpatient Hospital Stay (HOSPITAL_COMMUNITY): Payer: Medicare Other

## 2022-01-16 ENCOUNTER — Other Ambulatory Visit (HOSPITAL_COMMUNITY): Payer: Self-pay | Admitting: *Deleted

## 2022-01-16 DIAGNOSIS — J9601 Acute respiratory failure with hypoxia: Secondary | ICD-10-CM | POA: Diagnosis not present

## 2022-01-16 DIAGNOSIS — Z794 Long term (current) use of insulin: Secondary | ICD-10-CM

## 2022-01-16 DIAGNOSIS — L03119 Cellulitis of unspecified part of limb: Secondary | ICD-10-CM

## 2022-01-16 DIAGNOSIS — A419 Sepsis, unspecified organism: Secondary | ICD-10-CM | POA: Diagnosis not present

## 2022-01-16 DIAGNOSIS — E1110 Type 2 diabetes mellitus with ketoacidosis without coma: Secondary | ICD-10-CM

## 2022-01-16 DIAGNOSIS — I501 Left ventricular failure: Secondary | ICD-10-CM | POA: Diagnosis not present

## 2022-01-16 DIAGNOSIS — I480 Paroxysmal atrial fibrillation: Secondary | ICD-10-CM | POA: Diagnosis not present

## 2022-01-16 DIAGNOSIS — G9340 Encephalopathy, unspecified: Secondary | ICD-10-CM | POA: Diagnosis not present

## 2022-01-16 DIAGNOSIS — G9341 Metabolic encephalopathy: Secondary | ICD-10-CM | POA: Diagnosis not present

## 2022-01-16 LAB — CULTURE, BLOOD (ROUTINE X 2)
Culture: NO GROWTH
Culture: NO GROWTH

## 2022-01-16 LAB — BASIC METABOLIC PANEL
Anion gap: 10 (ref 5–15)
BUN: 44 mg/dL — ABNORMAL HIGH (ref 8–23)
CO2: 27 mmol/L (ref 22–32)
Calcium: 7.8 mg/dL — ABNORMAL LOW (ref 8.9–10.3)
Chloride: 91 mmol/L — ABNORMAL LOW (ref 98–111)
Creatinine, Ser: 3.75 mg/dL — ABNORMAL HIGH (ref 0.61–1.24)
GFR, Estimated: 17 mL/min — ABNORMAL LOW (ref >60)
Glucose, Bld: 135 mg/dL — ABNORMAL HIGH (ref 70–99)
Potassium: 5.2 mmol/L — ABNORMAL HIGH (ref 3.5–5.1)
Sodium: 128 mmol/L — ABNORMAL LOW (ref 135–145)

## 2022-01-16 LAB — ECHOCARDIOGRAM LIMITED
Height: 73 in
S' Lateral: 3.3 cm
Weight: 4211.67 oz

## 2022-01-16 LAB — CBC
HCT: 30.5 % — ABNORMAL LOW (ref 39.0–52.0)
Hemoglobin: 9.8 g/dL — ABNORMAL LOW (ref 13.0–17.0)
MCH: 26.4 pg (ref 26.0–34.0)
MCHC: 32.1 g/dL (ref 30.0–36.0)
MCV: 82.2 fL (ref 80.0–100.0)
Platelets: 338 10*3/uL (ref 150–400)
RBC: 3.71 MIL/uL — ABNORMAL LOW (ref 4.22–5.81)
RDW: 20.3 % — ABNORMAL HIGH (ref 11.5–15.5)
WBC: 13.2 10*3/uL — ABNORMAL HIGH (ref 4.0–10.5)
nRBC: 0.5 % — ABNORMAL HIGH (ref 0.0–0.2)

## 2022-01-16 LAB — GLUCOSE, CAPILLARY
Glucose-Capillary: 117 mg/dL — ABNORMAL HIGH (ref 70–99)
Glucose-Capillary: 172 mg/dL — ABNORMAL HIGH (ref 70–99)
Glucose-Capillary: 172 mg/dL — ABNORMAL HIGH (ref 70–99)
Glucose-Capillary: 191 mg/dL — ABNORMAL HIGH (ref 70–99)

## 2022-01-16 MED ORDER — CEFAZOLIN SODIUM-DEXTROSE 2-4 GM/100ML-% IV SOLN
2.0000 g | INTRAVENOUS | Status: DC
  Administered 2022-01-16 – 2022-01-17 (×2): 2 g via INTRAVENOUS
  Filled 2022-01-16 (×3): qty 100

## 2022-01-16 MED ORDER — CHLORHEXIDINE GLUCONATE CLOTH 2 % EX PADS: 6.0000 | MEDICATED_PAD | Freq: Every day | CUTANEOUS | Status: DC

## 2022-01-16 NOTE — Progress Notes (Signed)
Family concerned about patient seeming SOB , patient VS obtained they are WNL . Patient is weak and ill appearing, physician assessed earlier states patient would be weak for a while d/t infections     01/16/22 1800  Vitals  Temp 97.6 F (36.4 C)  BP 109/79  BP Location Right Arm  BP Method Automatic  Patient Position (if appropriate) Sitting  Pulse Rate 72  Pulse Rate Source Monitor  Resp (!) 22  Level of Consciousness  Level of Consciousness Alert  MEWS COLOR  MEWS Score Color Green  Oxygen Therapy  SpO2 98 %  O2 Device Nasal Cannula  O2 Flow Rate (L/min) 2 L/min  MEWS Score  MEWS Temp 0  MEWS Systolic 0  MEWS Pulse 0  MEWS RR 1  MEWS LOC 0  MEWS Score 1

## 2022-01-16 NOTE — Progress Notes (Signed)
Patient ID: Danny [name], male   DOB: 1955-09-10, 66 y.o.   MRN: [id number] As noted yesterday I did correspond with Dr. Sharol Given and he has advised the following  Newt Minion, MD  Carole Civil, MD MRI scan does look like osteomyelitis of both ankles.  Would go ahead and have them get ankle-brachial indices on him.  Looks like he will eventually need bilateral transtibial amputations.  Not sure how mobile he is with his sacral decubitus ulcer.        So I will go ahead and order the ABIs

## 2022-01-16 NOTE — Progress Notes (Signed)
Pharmacy Antibiotic Note  Danny [name] is a 66 y.o. male admitted on 01/10/2022 with bacteremia.  Pharmacy has been consulted for Cefazolin dosing. 66 y.o. male with history of ESRD on hemodialysis via AV fistula, left total knee arthroplasty bilateral ulceration of his ankles with osteomyelitis,; now with Group B streptococcal bacteremia with osteomyelitis of bilateral ankles. He has continued to improve on PCN but planning to discharge and will be much easier to administer cefazolin with HD. Plan is for 8 weeks of antibiotics  Plan: Cefazolin 2gm IV today, then 2gm IV qT,TH,Sat after Hemodialysis Monitor cxs, clinical progress, V/S, labs  Height: '6\' 1"'$  (185.4 cm) Weight: 119.4 kg (263 lb 3.7 oz) IBW/kg (Calculated) : 79.9  Temp (24hrs), Avg:98.3 F (36.8 C), Min:97.8 F (36.6 C), Max:98.9 F (37.2 C)  Recent Labs  Lab 01/10/22 1720 01/10/22 2132 01/11/22 0337 01/11/22 0748 01/11/22 0749 01/11/22 0943 01/11/22 1222 01/12/22 0940 01/13/22 0353 01/14/22 0230 01/15/22 0435 01/16/22 0547  WBC  --   --    < >  --    < >  --   --  11.9* 10.2 11.3* 10.4 13.2*  CREATININE  --   --    < >  --    < >  --   --  5.07* 3.61* 4.24* 3.11* 3.75*  LATICACIDVEN 2.0* 3.0*  --  2.4*  --  2.2* 2.4*  --   --   --   --   --    < > = values in this interval not displayed.    Estimated Creatinine Clearance: 25.5 mL/min (A) (by C-G formula based on SCr of 3.75 mg/dL (H)).    Allergies  Allergen Reactions   Dust Mite Extract Itching and Other (See Comments)    Unknown reaction-potential shortness of breath   Prednisone Nausea And Vomiting   Rocephin [Ceftriaxone] Nausea And Vomiting    Antimicrobials this admission: Cefazolin 7/24>> Cipro 7/20 >> 7/21 Pen G 7/20 >> 7/24 cefepime 7/18 >> 7/20 Metronidazole 7/18 >>7/20  vancomycin 7/18 >> 7/20   Microbiology results: 7/18 Bcx: Grp B strep strep agalactiae S- PCN, ceftriaxone 7/18 Ucx: >100k e. Coli ESBL. (Stagnant urine)per ID and not  treating) 7/19 BCX: ngtd 7/20 [place]: ngtd 7/19MRSA PCR +  Thank you for allowing pharmacy to be a part of this patient's care. Isac Sarna, BS Pharm D, BCPS Clinical Pharmacist 01/16/2022 2:42 PM

## 2022-01-16 NOTE — Progress Notes (Signed)
Subjective:  HD on Thurs complicated by Afib.  Last HD on 7/22 with no UF ever charted.  States not usually on oxygen.  Hasn't made urine while here.     Review of systems: States shortness of breath better - he reflects that he's at his baseline  Denies chest pain  Denies n/v   Objective Vital signs in last 24 hours: Vitals:   01/15/22 1611 01/15/22 2003 01/15/22 2222 01/15/22 2354  BP: 129/83 (!) 148/91 107/77 115/64  Pulse: 87 82 78 83  Resp: (!) '22 20 19 '$ (!) 22  Temp: 98.9 F (37.2 C) 97.8 F (36.6 C) 97.8 F (36.6 C) 98.7 F (37.1 C)  TempSrc: Axillary Oral Oral   SpO2: 97% 100% 100% 100%  Weight:      Height:       Weight change:   Intake/Output Summary (Last 24 hours) at 01/16/2022 0947 Last data filed at 01/16/2022 0533 Gross per 24 hour  Intake 340 ml  Output --  Net 340 ml   Dialysis Orders: Center: [facility]  on TTS . EDW 110 kg HD Bath 2K/2.5Ca  Time 4 hours Heparin none. Access LUE AVF BFR 400 DFR 500    Venofer  50 mg IV once a week     Assessment/ Plan: Pt is a 66 y.o. yo male with ESRD who was admitted on 01/10/2022 with fever, weakness-  sepsis syndrome   Assessment/Plan: 1. Fever/presumed sepsis-  felt due to leg wound. abx per primary team.  2. ESRD -  continue HD per TTS schedule.  Usually at [place].  He declined an extra treatment today - doesn't think he needs 3. Anemia CKD -   On ESA - aranesp 100 mcg every Thursday   4. Secondary hyperparathyroidism- cont renvela-  is not on vit D or sensipar it seems  5. HTN/volume-  fluid volume overload.  Would lower EDW as an outpatient 6. Afib - meds per primary team   Labs: Basic Metabolic Panel: Recent Labs  Lab 01/11/22 0337 01/11/22 0749 01/14/22 0230 01/15/22 0435 01/16/22 0547  NA 128*   < > 129* 131* 128*  K 4.3   < > 5.4* 4.5 5.2*  CL 95*   < > 97* 95* 91*  CO2 21*   < > '22 27 27  '$ GLUCOSE 314*   < > 165* 154* 135*  BUN 52*   < > 54* 36* 44*  CREATININE 4.26*   < > 4.24* 3.11*  3.75*  CALCIUM 7.3*   < > 7.5* 7.7* 7.8*  PHOS 4.5  --   --   --   --    < > = values in this interval not displayed.   Liver Function Tests: Recent Labs  Lab 01/12/22 0940 01/13/22 0353 01/14/22 0230  AST 26 37 37  ALT '19 23 27  '$ ALKPHOS 136* 180* 198*  BILITOT 1.7* 1.9* 1.8*  PROT 5.9* 5.6* 6.0*  ALBUMIN 1.6* <1.5* 1.6*   No results for input(s): "LIPASE", "AMYLASE" in the last 168 hours. No results for input(s): "AMMONIA" in the last 168 hours. CBC: Recent Labs  Lab 01/10/22 1713 01/11/22 0337 01/11/22 0749 01/12/22 0940 01/13/22 0353 01/14/22 0230 01/15/22 0435 01/16/22 0547  WBC 10.6*   < > 19.1* 11.9* 10.2 11.3* 10.4 13.2*  NEUTROABS 10.1*  --  16.6*  --   --   --   --   --   HGB 9.6*   < > 9.5* 7.8* 7.8* 8.7*  8.5* 9.8*  HCT 29.7*   < > 30.2* 23.8* 24.3* 27.1* 26.6* 30.5*  MCV 84.1   < > 85.1 82.9 82.9 82.9 82.1 82.2  PLT 251   < > 280 243 229 267 279 338   < > = values in this interval not displayed.   Cardiac Enzymes: No results for input(s): "CKTOTAL", "CKMB", "CKMBINDEX", "TROPONINI" in the last 168 hours. CBG: Recent Labs  Lab 01/15/22 1052 01/15/22 1623 01/15/22 1652 01/15/22 2057 01/16/22 0704  GLUCAP 186* 147* 150* 151* 117*    Iron Studies: No results for input(s): "IRON", "TIBC", "TRANSFERRIN", "FERRITIN" in the last 72 hours. Studies/Results: No results found. Medications: Infusions:  sodium chloride Stopped (01/11/22 0915)   albumin human 60 mL/hr at 01/15/22 1542   pencillin G potassium IV 2 Million Units (01/16/22 4481)    Scheduled Medications:  acidophilus  1 capsule Oral TID WC   apixaban  5 mg Oral BID   Chlorhexidine Gluconate Cloth  6 each Topical Q0600   Chlorhexidine Gluconate Cloth  6 each Topical Q0600   darbepoetin (ARANESP) injection - NON-DIALYSIS  100 mcg Subcutaneous Q Thu-1800   ferrous sulfate  325 mg Oral Daily   insulin aspart  0-6 Units Subcutaneous TID WC   insulin detemir  5 Units Subcutaneous Daily    metoprolol succinate  50 mg Oral Daily   midodrine  10 mg Oral BID   mupirocin ointment   Nasal BID   omega-3 acid ethyl esters  1 g Oral Daily   pantoprazole  40 mg Oral Daily   rosuvastatin  5 mg Oral QPM   senna  2 tablet Oral BID   sevelamer carbonate  800 mg Oral BID WC    have reviewed scheduled and prn medications.  Physical Exam:  General adult male in bed in no acute distress HEENT normocephalic atraumatic extraocular movements intact sclera anicteric Neck supple trachea midline Lungs clear but decreased to auscultation bilaterally normal work of breathing at rest; on 2 liters Heart S1S2 no rub Abdomen soft nontender nondistended/obese habitus Extremities 2+ edema  Psych normal mood and affect LUE AVF bruit and thrill    General:  obese, slow mentally , c/o pain  really all over Heart: RRR Lungs: dec BS at the bases Abdomen: obese-  pitting edema to abdominal wall  Extremities: pitting edema-  mult abrasions-  left ankle wrapped  Dialysis Access: AVF    Claudia Desanctis, MD 01/16/2022,9:59 AM  LOS: 6 days

## 2022-01-16 NOTE — Progress Notes (Signed)
*  PRELIMINARY RESULTS* Echocardiogram Limited 2-D Echocardiogram  has been performed.  Danny [name] 01/16/2022, 1:35 PM

## 2022-01-16 NOTE — Progress Notes (Addendum)
Virtual Visit via Video Note  I connected with [name] on '@TODAY'$ @ at  by a video enabled telemedicine application and verified that I am speaking with the correct person using two identifiers.  Location: Patient: [name] [id number] Provider: Home   I discussed the limitations of evaluation and management by telemedicine and the availability of in person appointments. The patient expressed understanding and agreed to proceed.       Subjective: No new complaints   Antibiotics:  Anti-infectives (From admission, onward)    Start     Dose/Rate Route Frequency Ordered Stop   01/12/22 1600  vancomycin (VANCOCIN) IVPB 1000 mg/200 mL premix  Status:  Discontinued        1,000 mg 200 mL/hr over 60 Minutes Intravenous Every T-Th-Sa (Hemodialysis) 01/11/22 1300 01/12/22 1012   01/12/22 1600  penicillin G potassium 2 Million Units in dextrose 5 % 50 mL IVPB        2 Million Units 100 mL/hr over 30 Minutes Intravenous Every 4 hours 01/12/22 1013     01/12/22 1200  penicillin G potassium 4 Million Units in dextrose 5 % 250 mL IVPB        4 Million Units 250 mL/hr over 60 Minutes Intravenous  Once 01/12/22 1012 01/12/22 1304   01/12/22 1130  ciprofloxacin (CIPRO) tablet 500 mg  Status:  Discontinued       Note to Pharmacy: UTI - E-coli   500 mg Oral Every 24 hours 01/12/22 1118 01/13/22 1112   01/11/22 2200  ceFEPIme (MAXIPIME) 1 g in sodium chloride 0.9 % 100 mL IVPB  Status:  Discontinued        1 g 200 mL/hr over 30 Minutes Intravenous Every 24 hours 01/10/22 1815 01/12/22 1012   01/11/22 0715  metroNIDAZOLE (FLAGYL) IVPB 500 mg  Status:  Discontinued        500 mg 100 mL/hr over 60 Minutes Intravenous Every 12 hours 01/11/22 0714 01/12/22 1012   01/10/22 1730  ceFEPIme (MAXIPIME) 2 g in sodium chloride 0.9 % 100 mL IVPB        2 g 200 mL/hr over 30 Minutes Intravenous  Once 01/10/22 1719 01/10/22 1804   01/10/22 1730  vancomycin (VANCOREADY) IVPB 2000 mg/400 mL        2,000 mg 200  mL/hr over 120 Minutes Intravenous  Once 01/10/22 1720 01/10/22 2132   01/10/22 1715  aztreonam (AZACTAM) 2 g in sodium chloride 0.9 % 100 mL IVPB  Status:  Discontinued        2 g 200 mL/hr over 30 Minutes Intravenous  Once 01/10/22 1713 01/10/22 1719   01/10/22 1715  metroNIDAZOLE (FLAGYL) IVPB 500 mg        500 mg 100 mL/hr over 60 Minutes Intravenous  Once 01/10/22 1713 01/10/22 1912   01/10/22 1715  vancomycin (VANCOCIN) IVPB 1000 mg/200 mL premix  Status:  Discontinued        1,000 mg 200 mL/hr over 60 Minutes Intravenous  Once 01/10/22 1713 01/10/22 1720       Medications: Scheduled Meds:  acidophilus  1 capsule Oral TID WC   apixaban  5 mg Oral BID   Chlorhexidine Gluconate Cloth  6 each Topical Q0600   Chlorhexidine Gluconate Cloth  6 each Topical Q0600   [START ON 01/17/2022] Chlorhexidine Gluconate Cloth  6 each Topical Q0600   darbepoetin (ARANESP) injection - NON-DIALYSIS  100 mcg Subcutaneous Q Thu-1800   ferrous sulfate  325 mg Oral Daily  insulin aspart  0-6 Units Subcutaneous TID WC   insulin detemir  5 Units Subcutaneous Daily   metoprolol succinate  50 mg Oral Daily   midodrine  10 mg Oral BID   mupirocin ointment   Nasal BID   omega-3 acid ethyl esters  1 g Oral Daily   pantoprazole  40 mg Oral Daily   rosuvastatin  5 mg Oral QPM   senna  2 tablet Oral BID   sevelamer carbonate  800 mg Oral BID WC   Continuous Infusions:  sodium chloride Stopped (01/11/22 0915)   albumin human 60 mL/hr at 01/15/22 1542   pencillin G potassium IV 2 Million Units (01/16/22 1207)   PRN Meds:.acetaminophen **OR** acetaminophen, albumin human, ALPRAZolam, levalbuterol, metoprolol tartrate, ondansetron **OR** ondansetron (ZOFRAN) IV, pentafluoroprop-tetrafluoroeth, sodium chloride    Objective: Weight change:   Intake/Output Summary (Last 24 hours) at 01/16/2022 1424 Last data filed at 01/16/2022 0900 Gross per 24 hour  Intake 580 ml  Output --  Net 580 ml   Blood  pressure 115/64, pulse 83, temperature 98.7 F (37.1 C), resp. rate (!) 22, height '6\' 1"'$  (1.854 m), weight 119.4 kg, SpO2 100 %. Temp:  [97.8 F (36.6 C)-98.9 F (37.2 C)] 98.7 F (37.1 C) (07/23 2354) Pulse Rate:  [78-87] 83 (07/23 2354) Resp:  [19-25] 22 (07/23 2354) BP: (107-148)/(64-91) 115/64 (07/23 2354) SpO2:  [97 %-100 %] 100 % (07/23 2354)  Physical Exam: Physical Exam Constitutional:      Appearance: He is ill-appearing.  HENT:     Head: Normocephalic and atraumatic.  Cardiovascular:     Rate and Rhythm: Rhythm irregular.  Pulmonary:     Effort: No respiratory distress.     Breath sounds: No wheezing.  Abdominal:     General: There is no distension.  Skin:    Coloration: Skin is pale.     Findings: Erythema present.  Neurological:     General: No focal deficit present.  Psychiatric:        Speech: Speech is delayed.        Behavior: Behavior is cooperative.        Cognition and Memory: He exhibits impaired recent memory.     Erythema in right lower extremity is stable  CBC:    BMET Recent Labs    01/15/22 0435 01/16/22 0547  NA 131* 128*  K 4.5 5.2*  CL 95* 91*  CO2 27 27  GLUCOSE 154* 135*  BUN 36* 44*  CREATININE 3.11* 3.75*  CALCIUM 7.7* 7.8*     Liver Panel  Recent Labs    01/14/22 0230  PROT 6.0*  ALBUMIN 1.6*  AST 37  ALT 27  ALKPHOS 198*  BILITOT 1.8*       Sedimentation Rate Recent Labs    01/14/22 0230  ESRSEDRATE 55*   C-Reactive Protein Recent Labs    01/14/22 0230  CRP 13.7*    Micro Results: Recent Results (from the past 720 hour(s))  Resp Panel by RT-PCR (Flu A&B, Covid) Anterior Nasal Swab     Status: None   Collection Time: 01/10/22  5:20 PM   Specimen: Anterior Nasal Swab  Result Value Ref Range Status   SARS Coronavirus 2 by RT PCR NEGATIVE NEGATIVE Final    Comment: (NOTE) SARS-CoV-2 target nucleic acids are NOT DETECTED.  The SARS-CoV-2 RNA is generally detectable in upper  respiratory specimens during the acute phase of infection. The lowest concentration of SARS-CoV-2 viral copies this assay can detect is 138  copies/mL. A negative result does not preclude SARS-Cov-2 infection and should not be used as the sole basis for treatment or other patient management decisions. A negative result may occur with  improper specimen collection/handling, submission of specimen other than nasopharyngeal swab, presence of viral mutation(s) within the areas targeted by this assay, and inadequate number of viral copies(<138 copies/mL). A negative result must be combined with clinical observations, patient history, and epidemiological information. The expected result is Negative.  Fact Sheet for Patients:  EntrepreneurPulse.com.au  Fact Sheet for Healthcare Providers:  IncredibleEmployment.be  This test is no t yet approved or cleared by the [place] FDA and  has been authorized for detection and/or diagnosis of SARS-CoV-2 by FDA under an Emergency Use Authorization (EUA). This EUA will remain  in effect (meaning this test can be used) for the duration of the COVID-19 declaration under Section 564(b)(1) of the Act, 21 U.S.C.section 360bbb-3(b)(1), unless the authorization is terminated  or revoked sooner.       Influenza A by PCR NEGATIVE NEGATIVE Final   Influenza B by PCR NEGATIVE NEGATIVE Final    Comment: (NOTE) The Xpert Xpress SARS-CoV-2/FLU/RSV plus assay is intended as an aid in the diagnosis of influenza from Nasopharyngeal swab specimens and should not be used as a sole basis for treatment. Nasal washings and aspirates are unacceptable for Xpert Xpress SARS-CoV-2/FLU/RSV testing.  Fact Sheet for Patients: EntrepreneurPulse.com.au  Fact Sheet for Healthcare Providers: IncredibleEmployment.be  This test is not yet approved or cleared by the [place] FDA and has been  authorized for detection and/or diagnosis of SARS-CoV-2 by FDA under an Emergency Use Authorization (EUA). This EUA will remain in effect (meaning this test can be used) for the duration of the COVID-19 declaration under Section 564(b)(1) of the Act, 21 U.S.C. section 360bbb-3(b)(1), unless the authorization is terminated or revoked.  Performed at [facility], [address] [place], [place] [address]   Blood Culture (routine x 2)     Status: Abnormal   Collection Time: 01/10/22  5:20 PM   Specimen: BLOOD RIGHT FOREARM  Result Value Ref Range Status   Specimen Description   Final    BLOOD RIGHT FOREARM Performed at [facility], [address] [place], [place] [address]    Special Requests   Final    BOTTLES DRAWN AEROBIC AND ANAEROBIC Blood Culture results may not be optimal due to an inadequate volume of blood received in culture bottles Performed at [facility], [address], [place], [place] [address]    Culture  Setup Time   Final    GRAM POSITIVE COCCI BOTTLES DRAWN AEROBIC AND ANAEROBIC Gram Stain Report Called to,Read Back By and Verified With: FERRAINLO,J'@0322'$  BT MATTHEWS,B 7.19.2023 Performed at [facility], [address] [place], [place] [address]    Culture (A)  Final    GROUP B STREP(S.AGALACTIAE)ISOLATED SUSCEPTIBILITIES PERFORMED ON PREVIOUS CULTURE WITHIN THE LAST 5 DAYS. Performed at [facility], [place] [address], [place], [place] [address]    Report Status 01/13/2022 FINAL  Final  Blood Culture (routine x 2)     Status: Abnormal   Collection Time: 01/10/22  5:20 PM   Specimen: BLOOD  Result Value Ref Range Status   Specimen Description   Final    BLOOD RIGHT ANTECUBITAL Performed at [facility], [place] [address], [place], [place] [address]    Special Requests   Final    BOTTLES DRAWN AEROBIC AND ANAEROBIC Blood Culture adequate volume Performed at  [place] [place], [place] [address]    Culture  Setup Time   Final    GRAM  POSITIVE COCCI BOTTLES DRAWN AEROBIC AND ANAEROBIC Gram Stain Report Called to,Read Back By and Verified With: FERRAINLO,J'@0322'$  BY MAATHEWS,B 7.19.2023 Organism ID to follow CRITICAL RESULT CALLED TO, READ BACK BY AND VERIFIED WITH: Chucky May Antelope, AT [facility] [place] Performed at [facility], [place] [address] [place], [place] [address]    Culture GROUP B STREP(S.AGALACTIAE)ISOLATED (A)  Final   Report Status 01/13/2022 FINAL  Final   Organism ID, Bacteria GROUP B STREP(S.AGALACTIAE)ISOLATED  Final      Susceptibility   Group b strep(s.agalactiae)isolated - MIC*    CLINDAMYCIN <=0.25 SENSITIVE Sensitive     AMPICILLIN <=0.25 SENSITIVE Sensitive     ERYTHROMYCIN 0.5 INTERMEDIATE Intermediate     VANCOMYCIN 0.5 SENSITIVE Sensitive     CEFTRIAXONE <=0.12 SENSITIVE Sensitive     LEVOFLOXACIN 0.5 SENSITIVE Sensitive     PENICILLIN Value in next row Sensitive      SENSITIVE0.12    * GROUP B STREP(S.AGALACTIAE)ISOLATED  Blood Culture ID Panel (Reflexed)     Status: Abnormal   Collection Time: 01/10/22  5:20 PM  Result Value Ref Range Status   Enterococcus faecalis NOT DETECTED NOT DETECTED Final   Enterococcus Faecium NOT DETECTED NOT DETECTED Final   Listeria monocytogenes NOT DETECTED NOT DETECTED Final   Staphylococcus species NOT DETECTED NOT DETECTED Final   Staphylococcus aureus (BCID) NOT DETECTED NOT DETECTED Final   Staphylococcus epidermidis NOT DETECTED NOT DETECTED Final   Staphylococcus lugdunensis NOT DETECTED NOT DETECTED Final   Streptococcus species DETECTED (A) NOT DETECTED Final    Comment: CRITICAL RESULT CALLED TO, READ BACK BY AND VERIFIED WITH: G. COFFEE PHARMD, AT [phone] D. VANHOOK    Streptococcus agalactiae DETECTED (A) NOT DETECTED Final    Comment: CRITICAL RESULT CALLED TO, READ BACK BY AND VERIFIED WITH: G. COFFEE PHARMD, AT [phone] D. VANHOOK    Streptococcus pneumoniae NOT DETECTED NOT DETECTED Final   Streptococcus pyogenes NOT  DETECTED NOT DETECTED Final   A.calcoaceticus-baumannii NOT DETECTED NOT DETECTED Final   Bacteroides fragilis NOT DETECTED NOT DETECTED Final   Enterobacterales NOT DETECTED NOT DETECTED Final   Enterobacter cloacae complex NOT DETECTED NOT DETECTED Final   Escherichia coli NOT DETECTED NOT DETECTED Final   Klebsiella aerogenes NOT DETECTED NOT DETECTED Final   Klebsiella oxytoca NOT DETECTED NOT DETECTED Final   Klebsiella pneumoniae NOT DETECTED NOT DETECTED Final   Proteus species NOT DETECTED NOT DETECTED Final   Salmonella species NOT DETECTED NOT DETECTED Final   Serratia marcescens NOT DETECTED NOT DETECTED Final   Haemophilus influenzae NOT DETECTED NOT DETECTED Final   Neisseria meningitidis NOT DETECTED NOT DETECTED Final   Pseudomonas aeruginosa NOT DETECTED NOT DETECTED Final   Stenotrophomonas maltophilia NOT DETECTED NOT DETECTED Final   Candida albicans NOT DETECTED NOT DETECTED Final   Candida auris NOT DETECTED NOT DETECTED Final   Candida glabrata NOT DETECTED NOT DETECTED Final   Candida krusei NOT DETECTED NOT DETECTED Final   Candida parapsilosis NOT DETECTED NOT DETECTED Final   Candida tropicalis NOT DETECTED NOT DETECTED Final   Cryptococcus neoformans/gattii NOT DETECTED NOT DETECTED Final    Comment: Performed at [facility], [address], [place], [place] [address]  Urine Culture     Status: Abnormal   Collection Time: 01/10/22  5:48 PM   Specimen: Urine, Clean  Catch  Result Value Ref Range Status   Specimen Description   Final    URINE, CLEAN CATCH Performed at [facility], [address], [place], [place] [address]    Special Requests   Final    NONE Performed at [facility], [address], [place], [place] [address]    Culture (A)  Final    >=100,000 COLONIES/mL ESCHERICHIA COLI Confirmed Extended Spectrum Beta-Lactamase Producer (ESBL).  In bloodstream infections from ESBL organisms, carbapenems are preferred over  piperacillin/tazobactam. They are shown to have a lower risk of mortality.    Report Status 01/13/2022 FINAL  Final   Organism ID, Bacteria ESCHERICHIA COLI (A)  Final      Susceptibility   Escherichia coli - MIC*    AMPICILLIN >=32 RESISTANT Resistant     CEFAZOLIN >=64 RESISTANT Resistant     CEFEPIME >=32 RESISTANT Resistant     CEFTRIAXONE >=64 RESISTANT Resistant     CIPROFLOXACIN >=4 RESISTANT Resistant     GENTAMICIN >=16 RESISTANT Resistant     IMIPENEM <=0.25 SENSITIVE Sensitive     NITROFURANTOIN 128 RESISTANT Resistant     TRIMETH/SULFA >=320 RESISTANT Resistant     AMPICILLIN/SULBACTAM >=32 RESISTANT Resistant     PIP/TAZO 16 SENSITIVE Sensitive     * >=100,000 COLONIES/mL ESCHERICHIA COLI  Culture, blood (x 2)     Status: None   Collection Time: 01/11/22  7:48 AM   Specimen: BLOOD  Result Value Ref Range Status   Specimen Description   Final    BLOOD BLOOD RIGHT HAND Performed at [facility], [address], [place], [place] [address]    Special Requests   Final    BOTTLES DRAWN AEROBIC AND ANAEROBIC Blood Culture results may not be optimal due to an inadequate volume of blood received in culture bottles Performed at [facility], [address], [place], [place] [address]    Culture   Final    NO GROWTH 5 DAYS Performed at [facility], [address], [place], [place] [address]    Report Status 01/16/2022 FINAL  Final  Culture, blood (x 2)     Status: None   Collection Time: 01/11/22  7:58 AM   Specimen: BLOOD  Result Value Ref Range Status   Specimen Description   Final    BLOOD BLOOD RIGHT HAND Performed at [facility], [place] [address] [place], [place] [address]    Special Requests   Final    BOTTLES DRAWN AEROBIC AND ANAEROBIC Blood Culture results may not be optimal due to an inadequate volume of blood received in culture bottles Performed at [facility], [address], [place], [place] [address]    Culture   Final    NO GROWTH 5 DAYS Performed at [facility], [address] [place],  [address]    Report Status 01/16/2022 FINAL  Final  MRSA Next Gen by PCR, Nasal     Status: Abnormal   Collection Time: 01/11/22 11:02 AM   Specimen: Nasal Mucosa; Nasal Swab  Result Value Ref Range Status   MRSA by PCR Next Gen DETECTED (A) NOT DETECTED Final    Comment: RESULT CALLED TO, READ BACK BY AND VERIFIED WITH: DANNY GREBER @ [id number] ON 01/11/22 C VARNER (NOTE) The GeneXpert MRSA Assay (FDA approved for NASAL specimens only), is one component of a comprehensive MRSA colonization surveillance program. It is not intended to diagnose MRSA infection nor to  guide or monitor treatment for MRSA infections. Test performance is not FDA approved in patients less than 56 years old. Performed at [facility], [address], [place], [place] [address]   Culture, blood (Routine X 2) w Reflex to ID Panel     Status: None (Preliminary result)   Collection Time: 01/12/22  1:53 AM   Specimen: BLOOD RIGHT FOREARM  Result Value Ref Range Status   Specimen Description BLOOD RIGHT FOREARM  Final   Special Requests   Final    BOTTLES DRAWN AEROBIC ONLY Blood Culture adequate volume   Culture   Final    NO GROWTH 4 DAYS Performed at [facility], [address], [place], [place] [address]    Report Status PENDING  Incomplete  Culture, blood (Routine X 2) w Reflex to ID Panel     Status: None (Preliminary result)   Collection Time: 01/12/22  1:53 AM   Specimen: Right Antecubital; Blood  Result Value Ref Range Status   Specimen Description RIGHT ANTECUBITAL  Final   Special Requests   Final    BOTTLES DRAWN AEROBIC AND ANAEROBIC Blood Culture results may not be optimal due to an excessive volume of blood received in culture bottles   Culture   Final    NO GROWTH 4 DAYS Performed at [facility], [address] [place], [place] [address]     Report Status PENDING  Incomplete    Studies/Results: ECHOCARDIOGRAM LIMITED  Result Date: 01/16/2022    ECHOCARDIOGRAM LIMITED REPORT   Patient Name:   Danny [name] Date of Exam: 01/16/2022 Medical Rec #:  [id number]     Height:       73.0 in Accession #:    [id number]    Weight:       263.2 lb Date of Birth:  1956/03/02      BSA:          2.418 m Patient Age:    66 years      BP:           115/64 mmHg Patient Gender: M             HR:           80 bpm. Exam Location:  [name] Procedure: Limited Echo Indications:    Eval LVEF  History:        Patient has prior history of Echocardiogram examinations.                 Arrythmias:Atrial Fibrillation; Risk Factors:Hypertension and                 Diabetes. ESRD on dialysis [facility]).  Sonographer:    Alvino Chapel RCS Referring Phys: [place]  1. Limited study.  2. Left ventricular ejection fraction, by estimation, is 55 to 60%. The left ventricle has normal function. The left ventricle has no regional wall motion abnormalities. There is mild left ventricular hypertrophy. There is the interventricular septum is  flattened in systole and diastole, consistent with right ventricular pressure and volume overload.  3. Right ventricular systolic function is moderately reduced. The right ventricular size is moderately enlarged.  4. The mitral valve is abnormal.  5. The inferior vena cava is dilated in size with <50% respiratory variability, suggesting right atrial pressure of 15 mmHg. Comparison(s): Prior images reviewed side by side. LVEF remains normal. Moderate RV dysfunction, RVSP was not calculated. FINDINGS  Left Ventricle: Left ventricular ejection fraction, by estimation, is 55  to 60%. The left ventricle has normal function. The left ventricle has no regional wall motion abnormalities. The left ventricular internal cavity size was normal in size. There is  mild left ventricular hypertrophy. The interventricular septum is flattened in systole  and diastole, consistent with right ventricular pressure and volume overload. Right Ventricle: The right ventricular size is moderately enlarged. No increase in right ventricular wall thickness. Right ventricular systolic function is moderately reduced. Pericardium: There is no evidence of pericardial effusion. Mitral Valve: The mitral valve is abnormal. There is mild thickening of the mitral valve leaflet(s). There is mild calcification of the mitral valve leaflet(s). Aorta: The aortic root is normal in size and structure. Venous: The inferior vena cava is dilated in size with less than 50% respiratory variability, suggesting right atrial pressure of 15 mmHg. LEFT VENTRICLE PLAX 2D LVIDd:         5.10 cm LVIDs:         3.30 cm LV PW:         1.20 cm LV IVS:        1.30 cm LVOT diam:     2.00 cm LVOT Area:     3.14 cm  LEFT ATRIUM         Index LA diam:    3.80 cm 1.57 cm/m   AORTA Ao Root diam: 3.80 cm  SHUNTS Systemic Diam: 2.00 cm Rozann Lesches MD Electronically signed by Rozann Lesches MD Signature Date/Time: 01/16/2022/1:41:10 PM    Final       Assessment/Plan:  INTERVAL HISTORY: Dr. Sharol Given is recommending the patient eventually have transtibial bilateral amputations to treat his osteomyelitis   Principal Problem:   Septic shock ([place]) Active Problems:   Cellulitis, leg   ESRD on dialysis ([place])   Acute metabolic encephalopathy   Iron deficiency anemia   Atrial fibrillation, chronic (HCC)   Type 2 diabetes mellitus ([place])   Obesity (BMI 30-39.9)   Mixed hyperlipidemia   Hypotension   Open wound of right foot   Lactic acidosis   Acute encephalopathy   Acute hypoxemic respiratory failure (HCC)   Severe sepsis (HCC)   Streptococcal bacteremia   Chronic osteomyelitis, ankle (HCC)   History of revision of total knee arthroplasty    JOSHVA LABRECK is a 66 y.o. male with history of end-stage renal disease on hemodialysis via AV fistula, left total knee arthroplasty bilateral ulceration of  his ankles with osteomyelitis seen back in April 2023 now admitted with group B streptococcus bacteremia and septic shock requiring vasopressors.  To be CLEAR he does not have UTI, someone cultured his stagnant urine which is not going to ever likely appear sterile  On exam he does have erythema of his right lower extremity but more concerning he has persistent ulceration of his ankles bilaterally and likely chronic osteomyelitis that certainly could be a source of his bacteremia.  He remains profoundly fatigued which is understandable given that he is just recovering from having been in the intensive care unit.  He has had difficulty with atrial fibrillation and now has had 2D echocardiogram that does show thickening of the mitral valve leaflet.  1 could consider transesophageal echocardiogram but I do not think is really worth it in this patient he would never be a candidate for valve replacement   Absent a below the knee amputation happening soon I would give him protracted antibiotics and plan on giving him 8 weeks of systemic antibiotics.  I think cefazolin with hemodialysis will be  a much more friendly regimen for the outpatient world and we can switch over to that.  Diagnosis: Group B streptococcal bacteremia with osteomyelitis of bilateral ankles  Culture Result: GBS  Allergies  Allergen Reactions   Dust Mite Extract Itching and Other (See Comments)    Unknown reaction-potential shortness of breath   Prednisone Nausea And Vomiting   Rocephin [Ceftriaxone] Nausea And Vomiting    OPAT Orders Discharge antibiotics to be given via PICC line Cefazolin 2 grams IV with HD x 8 weeks  Duration: 8 weeks End Date:  Hx of left TKA: has had some pain with this in recent times, would make sure no evidence of infection here  Clinic Follow Up Appt:   GARISON GENOVA has an appointment on 02/22/2022 at 245 with Dr. [name] @  [facility] is located in  the [facility] which is located at:  [address] in [place].  [address], which is located to the left of the elevators.  Phone: [phone]  Fax: [phone]  [email or web address]  I spent 36  minutes with the patient including than 50% of the time in face to face counseling of the patient guarding his osteomyelitis and group B strep coccal bacteremia personally reviewing TTE, along  with review of medical records in preparation for the visit and during the visit and in coordination of his care.    LOS: 6 days   Alcide Evener 01/16/2022, 2:24 PM

## 2022-01-16 NOTE — Progress Notes (Addendum)
Progress Note  Patient Name: Danny [name] Date of Encounter: 01/16/2022  [facility] Cardiologist: Danny Rouge, MD   Subjective   No chest pain or palpitations.  Inpatient Medications    Scheduled Meds:  acidophilus  1 capsule Oral TID WC   apixaban  5 mg Oral BID   Chlorhexidine Gluconate Cloth  6 each Topical Q0600   Chlorhexidine Gluconate Cloth  6 each Topical Q0600   darbepoetin (ARANESP) injection - NON-DIALYSIS  100 mcg Subcutaneous Q Thu-1800   ferrous sulfate  325 mg Oral Daily   insulin aspart  0-6 Units Subcutaneous TID WC   insulin detemir  5 Units Subcutaneous Daily   metoprolol succinate  50 mg Oral Daily   midodrine  10 mg Oral BID   mupirocin ointment   Nasal BID   omega-3 acid ethyl esters  1 g Oral Daily   pantoprazole  40 mg Oral Daily   rosuvastatin  5 mg Oral QPM   senna  2 tablet Oral BID   sevelamer carbonate  800 mg Oral BID WC   Continuous Infusions:  sodium chloride Stopped (01/11/22 0915)   albumin human 60 mL/hr at 01/15/22 1542   pencillin G potassium IV 2 Million Units (01/16/22 0452)   PRN Meds: acetaminophen **OR** acetaminophen, albumin human, ALPRAZolam, levalbuterol, metoprolol tartrate, ondansetron **OR** ondansetron (ZOFRAN) IV, pentafluoroprop-tetrafluoroeth, sodium chloride   Vital Signs    Vitals:   01/15/22 1611 01/15/22 2003 01/15/22 2222 01/15/22 2354  BP: 129/83 (!) 148/91 107/77 115/64  Pulse: 87 82 78 83  Resp: (!) '22 20 19 '$ (!) 22  Temp: 98.9 F (37.2 C) 97.8 F (36.6 C) 97.8 F (36.6 C) 98.7 F (37.1 C)  TempSrc: Axillary Oral Oral   SpO2: 97% 100% 100% 100%  Weight:      Height:        Intake/Output Summary (Last 24 hours) at 01/16/2022 0827 Last data filed at 01/16/2022 0533 Gross per 24 hour  Intake 340 ml  Output --  Net 340 ml      01/15/2022    4:00 AM 01/13/2022    5:00 AM  Last 3 Weights  Weight (lbs) 263 lb 3.7 oz 262 lb 12.6 oz  Weight (kg) 119.4 kg 119.2 kg      Telemetry    SR,  burst of AF noted - Personally Reviewed  ECG    No new - Personally Reviewed  Physical Exam   GEN: No acute distress.   Neck: No JVD Cardiac: RRR, no gallops. Respiratory: Clear to auscultation bilaterally.  Labs    Chemistry Recent Labs  Lab 01/11/22 [id number] 01/11/22 0749 01/12/22 0940 01/13/22 0353 01/14/22 0230 01/15/22 0435 01/16/22 0547  NA 128*   < > 131* 131* 129* 131* 128*  K 4.3   < > 4.7 4.2 5.4* 4.5 5.2*  CL 95*   < > 99 98 97* 95* 91*  CO2 21*   < > '22 26 22 27 27  '$ GLUCOSE 314*   < > 163* 165* 165* 154* 135*  BUN 52*   < > 70* 45* 54* 36* 44*  CREATININE 4.26*   < > 5.07* 3.61* 4.24* 3.11* 3.75*  CALCIUM 7.3*   < > 7.5* 7.4* 7.5* 7.7* 7.8*  MG 2.0  --   --  1.8  --   --   --   PROT 6.5   < > 5.9* 5.6* 6.0*  --   --   ALBUMIN 1.8*   < >  1.6* <1.5* 1.6*  --   --   AST 21   < > 26 37 37  --   --   ALT 18   < > '19 23 27  '$ --   --   ALKPHOS 162*   < > 136* 180* 198*  --   --   BILITOT 2.5*   < > 1.7* 1.9* 1.8*  --   --   GFRNONAA 14*   < > 12* 18* 14* 21* 17*  ANIONGAP 12   < > '10 7 10 9 10   '$ < > = values in this interval not displayed.    Hematology Recent Labs  Lab 01/14/22 0230 01/15/22 0435 01/16/22 0547  WBC 11.3* 10.4 13.2*  RBC 3.27* 3.24* 3.71*  HGB 8.7* 8.5* 9.8*  HCT 27.1* 26.6* 30.5*  MCV 82.9 82.1 82.2  MCH 26.6 26.2 26.4  MCHC 32.1 32.0 32.1  RDW 19.9* 19.9* 20.3*  PLT 267 279 338    Radiology    No results found.  Cardiac Studies   Echocardiogram: 10/2020 IMPRESSIONS    1. Left ventricular ejection fraction, by estimation, is 55 to 60%. The  left ventricle has normal function. Left ventricular endocardial border  not optimally defined to evaluate regional wall motion. There is moderate  left ventricular hypertrophy. Left  ventricular diastolic parameters are indeterminate.   2. Right ventricular systolic function is mildly reduced. The right  ventricular size is moderately enlarged. There is severely elevated  pulmonary  artery systolic pressure. The estimated right ventricular  systolic pressure is 06.2 mmHg.   3. Left atrial size was mildly dilated.   4. Right atrial size was moderately dilated.   5. The mitral valve is normal in structure. Mild mitral valve  regurgitation. No evidence of mitral stenosis.   6. The aortic valve is grossly normal. Aortic valve regurgitation is not  visualized. No aortic stenosis is present.   7. The inferior vena cava is dilated in size with <50% respiratory  variability, suggesting right atrial pressure of 15 mmHg.    Venous dopplers with no evidence of DVT 01/11/22  Patient Profile     66 y.o. male  with a hx of HTN, HLD, Type 2 DM, persistent atrial fibrillation (appears this was diagnosed in 07/2021 while admitted for anemia and recurrence in 09/2021 while admitted for osteomyelitis) and ESRD now with septic shock, acute metabolic encephalopathy and afib RVR.   Assessment & Plan    Atrial fib with RVR  --recurrent issue this year.  Now recurrent with septic shock.  --plan for limited echo --has had hypotension so difficulty with his po meds. --was on amiodarone the 21st and 22nd IV now back on toprol 50 mg  in addition to midodrine for hypotension --maintaining SR and BP somewhat labile but stable.   HTN --has been hypotensive but improved though labile at times. With midodrine he is tolerating the BB  Septic shock in setting of E coli UTI and cellulitis --off pressors, and ABX and ID is following  --per ID and IM  ESRD on HD TTS per renal with volume overload       For questions or updates, please contact [facility] Please consult [email or web address] for contact info under        Signed, Danny Kicks, NP  01/16/2022, 8:27 AM      Attending note:  Patient seen and examined.  I reviewed the chart and agree with above assessment by Ms. Dorene Ar NP.  Mr.  [name] is maintaining sinus rhythm, was temporarily on IV amiodarone and now tolerating oral Toprol-XL  at 50 mg daily.  Blood pressure low normal to intermittently hypotensive, but on midodrine as well.  He appears comfortable this morning.  Afebrile, heart rate is in the 80s in sinus rhythm by telemetry which I personally reviewed.  Brief burst of AF noted.  Blood pressure 115/64.  Pertinent lab work includes potassium 5.2, BUN 44, creatinine 3.75, hemoglobin 9.8, platelets 338.  Limited echocardiogram ordered.  No change in current regimen including Eliquis, midodrine and Toprol-XL.  Satira Sark, M.D., F.A.C.C.

## 2022-01-17 DIAGNOSIS — E871 Hypo-osmolality and hyponatremia: Secondary | ICD-10-CM

## 2022-01-17 DIAGNOSIS — I4819 Other persistent atrial fibrillation: Secondary | ICD-10-CM | POA: Diagnosis not present

## 2022-01-17 DIAGNOSIS — R6521 Severe sepsis with septic shock: Secondary | ICD-10-CM | POA: Diagnosis not present

## 2022-01-17 DIAGNOSIS — R4182 Altered mental status, unspecified: Secondary | ICD-10-CM

## 2022-01-17 DIAGNOSIS — A419 Sepsis, unspecified organism: Secondary | ICD-10-CM | POA: Diagnosis not present

## 2022-01-17 LAB — BASIC METABOLIC PANEL
Anion gap: 13 (ref 5–15)
BUN: 56 mg/dL — ABNORMAL HIGH (ref 8–23)
CO2: 22 mmol/L (ref 22–32)
Calcium: 7.6 mg/dL — ABNORMAL LOW (ref 8.9–10.3)
Chloride: 90 mmol/L — ABNORMAL LOW (ref 98–111)
Creatinine, Ser: 4.42 mg/dL — ABNORMAL HIGH (ref 0.61–1.24)
GFR, Estimated: 14 mL/min — ABNORMAL LOW (ref >60)
Glucose, Bld: 192 mg/dL — ABNORMAL HIGH (ref 70–99)
Potassium: 6.5 mmol/L (ref 3.5–5.1)
Sodium: 125 mmol/L — ABNORMAL LOW (ref 135–145)

## 2022-01-17 LAB — CBC WITH DIFFERENTIAL/PLATELET
Abs Immature Granulocytes: 0.16 10*3/uL — ABNORMAL HIGH (ref 0.00–0.07)
Basophils Absolute: 0 10*3/uL (ref 0.0–0.1)
Basophils Relative: 0 %
Eosinophils Absolute: 0 10*3/uL (ref 0.0–0.5)
Eosinophils Relative: 0 %
HCT: 27.2 % — ABNORMAL LOW (ref 39.0–52.0)
Hemoglobin: 9 g/dL — ABNORMAL LOW (ref 13.0–17.0)
Immature Granulocytes: 2 %
Lymphocytes Relative: 8 %
Lymphs Abs: 0.7 10*3/uL (ref 0.7–4.0)
MCH: 26.3 pg (ref 26.0–34.0)
MCHC: 33.1 g/dL (ref 30.0–36.0)
MCV: 79.5 fL — ABNORMAL LOW (ref 80.0–100.0)
Monocytes Absolute: 0.6 10*3/uL (ref 0.1–1.0)
Monocytes Relative: 7 %
Neutro Abs: 6.9 10*3/uL (ref 1.7–7.7)
Neutrophils Relative %: 83 %
Platelets: 298 10*3/uL (ref 150–400)
RBC: 3.42 MIL/uL — ABNORMAL LOW (ref 4.22–5.81)
RDW: 20.6 % — ABNORMAL HIGH (ref 11.5–15.5)
WBC: 8.3 10*3/uL (ref 4.0–10.5)
nRBC: 1.1 % — ABNORMAL HIGH (ref 0.0–0.2)

## 2022-01-17 LAB — GLUCOSE, CAPILLARY
Glucose-Capillary: 141 mg/dL — ABNORMAL HIGH (ref 70–99)
Glucose-Capillary: 208 mg/dL — ABNORMAL HIGH (ref 70–99)
Glucose-Capillary: 127 mg/dL — ABNORMAL HIGH (ref 70–99)

## 2022-01-17 LAB — CULTURE, BLOOD (ROUTINE X 2)
Culture: NO GROWTH
Culture: NO GROWTH
Special Requests: ADEQUATE

## 2022-01-17 LAB — COMPREHENSIVE METABOLIC PANEL
ALT: 17 U/L (ref 0–44)
AST: 16 U/L (ref 15–41)
Albumin: 2.1 g/dL — ABNORMAL LOW (ref 3.5–5.0)
Alkaline Phosphatase: 188 U/L — ABNORMAL HIGH (ref 38–126)
Anion gap: 12 (ref 5–15)
BUN: 36 mg/dL — ABNORMAL HIGH (ref 8–23)
CO2: 25 mmol/L (ref 22–32)
Calcium: 7.7 mg/dL — ABNORMAL LOW (ref 8.9–10.3)
Chloride: 91 mmol/L — ABNORMAL LOW (ref 98–111)
Creatinine, Ser: 3.06 mg/dL — ABNORMAL HIGH (ref 0.61–1.24)
GFR, Estimated: 21 mL/min — ABNORMAL LOW (ref >60)
Glucose, Bld: 137 mg/dL — ABNORMAL HIGH (ref 70–99)
Potassium: 4.5 mmol/L (ref 3.5–5.1)
Sodium: 128 mmol/L — ABNORMAL LOW (ref 135–145)
Total Bilirubin: 2 mg/dL — ABNORMAL HIGH (ref 0.3–1.2)
Total Protein: 6.9 g/dL (ref 6.5–8.1)

## 2022-01-17 LAB — OCCULT BLOOD X 1 CARD TO LAB, STOOL: Fecal Occult Bld: POSITIVE — AB

## 2022-01-17 LAB — CBC
HCT: 30 % — ABNORMAL LOW (ref 39.0–52.0)
Hemoglobin: 9.7 g/dL — ABNORMAL LOW (ref 13.0–17.0)
MCH: 26.7 pg (ref 26.0–34.0)
MCHC: 32.3 g/dL (ref 30.0–36.0)
MCV: 82.6 fL (ref 80.0–100.0)
Platelets: 394 10*3/uL (ref 150–400)
RBC: 3.63 MIL/uL — ABNORMAL LOW (ref 4.22–5.81)
RDW: 21 % — ABNORMAL HIGH (ref 11.5–15.5)
WBC: 13.1 10*3/uL — ABNORMAL HIGH (ref 4.0–10.5)
nRBC: 0.9 % — ABNORMAL HIGH (ref 0.0–0.2)

## 2022-01-17 LAB — BLOOD GAS, ARTERIAL
Acid-Base Excess: 2.5 mmol/L — ABNORMAL HIGH (ref 0.0–2.0)
Bicarbonate: 28.3 mmol/L — ABNORMAL HIGH (ref 20.0–28.0)
Drawn by: 30136
O2 Saturation: 99.2 %
Patient temperature: 35.4
pCO2 arterial: 46 mmHg (ref 32–48)
pH, Arterial: 7.39 (ref 7.35–7.45)
pO2, Arterial: 94 mmHg (ref 83–108)

## 2022-01-17 MED ORDER — ALBUTEROL (5 MG/ML) CONTINUOUS INHALATION SOLN
10.0000 mg/h | INHALATION_SOLUTION | RESPIRATORY_TRACT | Status: DC
  Filled 2022-01-17: qty 20

## 2022-01-17 MED ORDER — CHLORHEXIDINE GLUCONATE CLOTH 2 % EX PADS
6.0000 | MEDICATED_PAD | Freq: Every day | CUTANEOUS | Status: DC
  Administered 2022-01-18: 6 via TOPICAL

## 2022-01-17 MED ORDER — ALBUMIN HUMAN 25 % IV SOLN: 25.0000 g | Freq: Once | INTRAVENOUS | Status: AC

## 2022-01-17 MED ORDER — ALBUMIN HUMAN 25 % IV SOLN
INTRAVENOUS | Status: AC
  Filled 2022-01-17: qty 100

## 2022-01-17 MED ORDER — ALBUTEROL SULFATE (2.5 MG/3ML) 0.083% IN NEBU
INHALATION_SOLUTION | RESPIRATORY_TRACT | Status: AC
  Filled 2022-01-17: qty 12

## 2022-01-17 NOTE — Progress Notes (Signed)
Patient's sister, [name], called to check on patient. Patient's sister aware of patient's critical condition, asked if she should come to see him. She lives in [place]. She was made aware that it would be a good idea to come visit. This RN asked patient if he would like to see his sister and he nodded his head yes.

## 2022-01-17 NOTE — Procedures (Signed)
   HEMODIALYSIS TREATMENT NOTE:   Pt to [facility] from [facility] via [place] bed.  Pre-HD 84% on 5L O2 via [place], responsive to voice, able to follow commands. 123/61 p74/NSR.  Pt was seen by Dr. Abdulaziz Shelter prior to starting tx.  O2 was^ to 7L and sats^ 95%.  HD initiated with 4.5 L goal.  Albumin 25g given once at start of session; hemodynamically stable for the next 2.5 hours. Then, converted to Afib/RVR 130s.  Goal was lowered and HR lowered to 100-120, then NSR 80s.  Soon thereafter, HR^ to 130s again. UF was stopped but dialysis continued.  Pt became increasingly hypoxic, desaturating to 85-88% on 7L [place].  RT was paged, Dr. Jamesetta Geralds and Dr. Royce Macadamia were informed of pt's decompensation, HD was stopped, BiPAP was initiated, and pt was transferred to SDU.  Total run time: 3 hours Blood volume processed:  58 liters Net UF removed:  2.2 liters

## 2022-01-17 NOTE — Progress Notes (Signed)
Date and time results received: 01/17/22 1057 (use smartphrase ".now" to insert current time)  Test: CBC  Critical Value: Potassium 6.5  Name of Provider Notified: Dr. Kawon Shelter  Orders Received? Or Actions Taken?:  No new order patient will have dialysis today

## 2022-01-17 NOTE — Progress Notes (Addendum)
PROGRESS NOTE    Patient: Danny [name]                            PCP: Celene Squibb, MD                    DOB: 06/07/1956            DOA: 01/10/2022 RAX:[id number]             DOS: 01/17/2022, 12:06 PM   LOS: 7 days   Date of Service: The patient was seen and examined on 01/17/2022  Subjective:   The patient was seen and examined this morning. Hemodynamically stable..  Shortness of breath at rest, on 3 L of oxygen, satting 97%, Noted for elevated potassium this morning of 6.5, patient is due for hemodialysis   Informed the patient of all acute on chronic illnesses including bilateral ankle osteomyelitis and possibility of amputation....  Brief Narrative:   Danny [name] is a 67 y.o. male with medical history significant of hypertension, ESRD (TTS), A-fib on Eliquis who presents to the emergency department via EMS from a nursing facility due to fever and weakness.   Patient states that he went to dialysis this morning, but no fluid was taken off him. Unable to provide further history, history was obtained from the ED physician and ED medical record.  Per report, patient was found to be hypoxic with O2 sat in the 80s, supplemental oxygen via [place] at 4 LPM was provided.  Patient was reported to be weak and confused today (different from baseline, good sense of humor, AOx3), he was reported to be fine during shift signout at the nursing facility.       ED Course:  Tmax of 102.78F, tachypneic, tachycardic.  Work-up in the ED showed normal CBC except for WBC of 10.6 BMP showed sodium 133, potassium 4.2, chloride 96, bicarb 26, glucose 129, BUN 55, creatinine 4.34, calcium 7.7, albumin 2.0, alkaline phosphatase 187, AST 22, ALT 18, total bilirubin 2.2, eGFR 14, lactic acid 2.0, sed rate 100.  Urinalysis was positive for large leukocytes, > 50 WBC, and many bacteria.  Influenza A, B, SARS coronavirus 2 was negative.  Blood culture pending. Chest x-ray showed possible small pleural effusions.  No  overt interstitial edema. He was treated with IV cefepime, Flagyl, vancomycin, Zofran was given, IV hydration was provided.  Tylenol was given due to fever.  Patient's BP continued to be in hypotensive range, he was started on peripheral IV Levophed.   Hospitalist was asked to admit patient for further evaluation and management.    Assessment & Plan:   Principal Problem:   Septic shock ([place]) Active Problems:   Atrial fibrillation, chronic (HCC)   Cellulitis, leg   ESRD on dialysis ([place])   Acute metabolic encephalopathy   Iron deficiency anemia   Type 2 diabetes mellitus (HCC)   Obesity (BMI 30-39.9)   Mixed hyperlipidemia   Hypotension   Open wound of right foot   Lactic acidosis   Acute encephalopathy   Acute hypoxemic respiratory failure (HCC)   Severe sepsis (HCC)   Streptococcal bacteremia   Chronic osteomyelitis, ankle (HCC)   History of revision of total knee arthroplasty  Sepsis-septic shock Bacteremia: Streptococcus, UTI E. coli -Possible source of infection right leg cellulitis -versus bilateral ankle osteomyelitis   -Stable now -Sepsis/septic shock physiology improving -Off Levophed, status post IV fluid resuscitation, IV antibiotic   -  Blood cultures are growing group B strep,  - Urine culture growing E. Coli -ESBL-colonized per ID no further treatment  -Bilateral lower extremity MRI, consistent with severe arthritis with local wound infection consistent with possible bilateral chronic osteomyelitis -Recommended continue antibiotics of penicillin G    -Discontinuing broad-spectrum antibiotics of vancomycin, Flagyl, cefepime discontinued on 01/12/2022 -penicillin G treatment for ID recommendation Per Dr. [name]  In Absent a below the knee amputation happening soon plan on giving him 8 weeks of systemic antibiotic Cefazolin with hemodialysis.     ------------------------------------------------------------------------------------- Met sepsis septic shock  criteria on admission-with encephalopathy, acute respiratory failure, hypotensive fever, tachycardia, tachypnea, leukocytosis -As needed Tylenol,   --Sepsis physiology resolved, hemodynamically stable, pressure and heart rate stabilized, will transfer out of ICU on 01/15/2022   Hyponatremia hypokalemia  -Acute on chronic...  -In the setting of end-stage renal disease -Due for hemodialysis today -Nephrology consulted, appreciate close evaluation, hemodialysis And further input regarding prognosis  acute metabolic encephalopathy -Resolved -- likely it was due to sepsis, septic shock -Continue treating underlying causes     Hypotension -acute on chronic  -Blood pressure remains soft but stable -Successfully weaned off Levophed -Continue home medication of midodrine    Right foot wound/bilateral ankle osteomyelitis Continue wound care -[place] lower extremity Doppler>>> No evidence of deep venous thrombosis in either lower extremity. -Bilateral lower extremity MRI, reporting significant inflammation, osteoarthritis, cannot rule out osteomyelitis -Orthopedic, Dr. Aline Brochure was consulted reviewed imaging, he will further consult Dr. Sharol Given for further evaluation and definitive management... Getting ABI  -Per Dr. [name]  In Absent a below the knee amputation happening soon plan on giving him 8 weeks of systemic antibiotic Cefazolin with hemodialysis.     Lactic acidosis Lactic acid 2.0, >>3.0 >>2.4   continue to trend lactic acid   ESRD on HD (TTS) Potassium 6.5, hemodialysis today Continue [facility] consulted for maintenance dialysis -Planning for hemodialysis today 7/20, 7/22, 7/25   A-fib on Eliquis Continue Eliquis Toprol-XL will be held at this time due to hypotension -Cardiology signed off   Type 2 diabetes mellitus Continue ISS and hypoglycemia protocol   Iron deficiency anemia Continue ferrous sulfate Continue senna to prevent constipation  Mixed  hyperlipidemia Continue statin Continue Lovaza   Obesity (BMI 32.66 kgm) Diet and lifestyle modification   Severe debility -Once stable PT OT will be consulted for evaluation and recommendation -Bedbound  Ethics: Palliative consulted, patient is confirmed DNR/DNI status Still requesting full treatment to be pursued ... Not made up his mind regarding possibility of amputation Due to multiple, recent steady decline, recommending palliative care-May be comfort care near future  ----------------------------------------------------------------------------------------------------------------------------------------------- Nutritional status:  The patient's BMI is: Body mass index is 34.73 kg/m. I agree with the assessment and plan as outline   Skin Assessment: I have examined the patient's skin and I agree with the wound assessment as performed by wound care team Bilateral lower extremity pressure ulcers dressing in place   --------------------------------------------------------------------------------------------------------------------------------------------- Cultures; Blood Cultures x 2 >> group B strep Repeat blood cultures >>> up-to-date Urine Culture  >>> E. Coli -ESBL Sputum Culture >> does not been obtained   -------------------------------------------------------------------------------------------------------------------------------------------   DVT prophylaxis:  SCDs Start: 01/10/22 1933 Apixaban (ELIQUIS) tablet 5 mg    Code Status:   Code Status: DNR   Family Communication: Prolonged discussion with POA Hulan Amato (his sister) The above findings and plan of care has been discussed with patient and his sister on the phone in detail,  they expressed  understanding and agreement of above. -Advance care planning has been discussed.  (Patient is due to be informed of a final decision regarding lower extremity wound care versus amputation Understanding the patient  may decline further... Needs palliative and eventually hospice)    Admission status:   Status is: Inpatient Remains inpatient appropriate because: Needing treatment for sepsis, septic shock, IV fluids, IV antibiotics,      Disposition:  To be discharged back to skilled nursing facility in next 24-48 hours once final decision regarding bilateral lower extremity osteomyelitis is confirmed. He will likely need 8 weeks of antibiotics per ID dialysis in absence of amputation  We will remain in hospital to determine final plan of care, treating hyperkalemia today with hemodialysis, receiving IV antibiotics with hemodialysis    Procedures:   No admission procedures for hospital encounter.   Antimicrobials:  Anti-infectives (From admission, onward)    Start     Dose/Rate Route Frequency Ordered Stop   01/16/22 1600  ceFAZolin (ANCEF) IVPB 2g/100 mL premix        2 g 200 mL/hr over 30 Minutes Intravenous Every T-Th-Sa (Hemodialysis) 01/16/22 1441 03/08/22 2359   01/12/22 1600  vancomycin (VANCOCIN) IVPB 1000 mg/200 mL premix  Status:  Discontinued        1,000 mg 200 mL/hr over 60 Minutes Intravenous Every T-Th-Sa (Hemodialysis) 01/11/22 1300 01/12/22 1012   01/12/22 1600  penicillin G potassium 2 Million Units in dextrose 5 % 50 mL IVPB  Status:  Discontinued        2 Million Units 100 mL/hr over 30 Minutes Intravenous Every 4 hours 01/12/22 1013 01/16/22 1435   01/12/22 1200  penicillin G potassium 4 Million Units in dextrose 5 % 250 mL IVPB        4 Million Units 250 mL/hr over 60 Minutes Intravenous  Once 01/12/22 1012 01/12/22 1304   01/12/22 1130  ciprofloxacin (CIPRO) tablet 500 mg  Status:  Discontinued       Note to Pharmacy: UTI - E-coli   500 mg Oral Every 24 hours 01/12/22 1118 01/13/22 1112   01/11/22 2200  ceFEPIme (MAXIPIME) 1 g in sodium chloride 0.9 % 100 mL IVPB  Status:  Discontinued        1 g 200 mL/hr over 30 Minutes Intravenous Every 24 hours 01/10/22 1815  01/12/22 1012   01/11/22 0715  metroNIDAZOLE (FLAGYL) IVPB 500 mg  Status:  Discontinued        500 mg 100 mL/hr over 60 Minutes Intravenous Every 12 hours 01/11/22 0714 01/12/22 1012   01/10/22 1730  ceFEPIme (MAXIPIME) 2 g in sodium chloride 0.9 % 100 mL IVPB        2 g 200 mL/hr over 30 Minutes Intravenous  Once 01/10/22 1719 01/10/22 1804   01/10/22 1730  vancomycin (VANCOREADY) IVPB 2000 mg/400 mL        2,000 mg 200 mL/hr over 120 Minutes Intravenous  Once 01/10/22 1720 01/10/22 2132   01/10/22 1715  aztreonam (AZACTAM) 2 g in sodium chloride 0.9 % 100 mL IVPB  Status:  Discontinued        2 g 200 mL/hr over 30 Minutes Intravenous  Once 01/10/22 1713 01/10/22 1719   01/10/22 1715  metroNIDAZOLE (FLAGYL) IVPB 500 mg        500 mg 100 mL/hr over 60 Minutes Intravenous  Once 01/10/22 1713 01/10/22 1912   01/10/22 1715  vancomycin (VANCOCIN) IVPB 1000 mg/200 mL premix  Status:  Discontinued  1,000 mg 200 mL/hr over 60 Minutes Intravenous  Once 01/10/22 1713 01/10/22 1720        Medication:   apixaban  5 mg Oral BID   Chlorhexidine Gluconate Cloth  6 each Topical Q0600   Chlorhexidine Gluconate Cloth  6 each Topical Q0600   Chlorhexidine Gluconate Cloth  6 each Topical Q0600   darbepoetin (ARANESP) injection - NON-DIALYSIS  100 mcg Subcutaneous Q Thu-1800   ferrous sulfate  325 mg Oral Daily   insulin aspart  0-6 Units Subcutaneous TID WC   insulin detemir  5 Units Subcutaneous Daily   metoprolol succinate  50 mg Oral Daily   midodrine  10 mg Oral BID   mupirocin ointment   Nasal BID   omega-3 acid ethyl esters  1 g Oral Daily   pantoprazole  40 mg Oral Daily   rosuvastatin  5 mg Oral QPM   senna  2 tablet Oral BID   sevelamer carbonate  800 mg Oral BID WC    acetaminophen **OR** acetaminophen, albumin human, ALPRAZolam, levalbuterol, metoprolol tartrate, ondansetron **OR** ondansetron (ZOFRAN) IV, pentafluoroprop-tetrafluoroeth, sodium chloride   Objective:    Vitals:   01/16/22 1800 01/16/22 2027 01/16/22 2326 01/17/22 0424  BP: 109/79 (!) 106/92  119/63  Pulse: 72 74  74  Resp: (!) 22 (!) 21  20  Temp: 97.6 F (36.4 C) 98.8 F (37.1 C)  98.4 F (36.9 C)  TempSrc:      SpO2: 98% 91% 94% 97%  Weight:      Height:        Intake/Output Summary (Last 24 hours) at 01/17/2022 1206 Last data filed at 01/17/2022 0957 Gross per 24 hour  Intake 860.92 ml  Output --  Net 860.92 ml   Filed Weights   01/12/22 0904 01/13/22 0500 01/15/22 0400  Weight: 111.1 kg 119.2 kg 119.4 kg     Examination:      Physical Exam:   General:  AAO x 3,  cooperative, no distress; chronically ill looking male, shortness of breath  HEENT:  Normocephalic, PERRL, otherwise with in Normal limits   Neuro:  CNII-XII intact. , normal motor and sensation, reflexes intact   Lungs:   Clear to auscultation BL, Respirations unlabored,  No wheezes / crackles  Cardio:    S1/S2, RRR, No murmure, No Rubs or Gallops   Abdomen:  Soft, non-tender, bowel sounds active all four quadrants, no guarding or peritoneal signs.  Muscular  skeletal:  Limited exam -severe global generalized weaknesses Severe chronic lower extremity weakness, unable to ambulate, bedbound Chronic bilateral ankle wounds - in bed, able to move all 4 extremities,   2+ pulses,  symmetric, No pitting edema  Skin:  Dry, warm to touch, negative for any Rashes, chronic bilateral wounds, pressure ulcers..   Wounds: Please see nursing documentation          ------------------------------------------------------------------------------------------------------------------------------------------    LABs:     Latest Ref Rng & Units 01/17/2022   10:24 AM 01/16/2022    5:47 AM 01/15/2022    4:35 AM  CBC  WBC 4.0 - 10.5 K/uL 13.1  13.2  10.4   Hemoglobin 13.0 - 17.0 g/dL 9.7  9.8  8.5   Hematocrit 39.0 - 52.0 % 30.0  30.5  26.6   Platelets 150 - 400 K/uL 394  338  279       Latest Ref Rng & Units  01/17/2022   10:24 AM 01/16/2022    5:47 AM 01/15/2022  4:35 AM  CMP  Glucose 70 - 99 mg/dL 192  135  154   BUN 8 - 23 mg/dL 56  44  36   Creatinine 0.61 - 1.24 mg/dL 4.42  3.75  3.11   Sodium 135 - 145 mmol/L 125  128  131   Potassium 3.5 - 5.1 mmol/L 6.5  5.2  4.5   Chloride 98 - 111 mmol/L 90  91  95   CO2 22 - 32 mmol/L _0 Calcium 8.9 - 10.3 mg/dL 7.6  7.8  7.7        Micro Results Recent Results (from the past 240 hour(s))  Resp Panel by RT-PCR (Flu A&B, Covid) Anterior Nasal Swab     Status: None   Collection Time: 01/10/22  5:20 PM   Specimen: Anterior Nasal Swab  Result Value Ref Range Status   SARS Coronavirus 2 by RT PCR NEGATIVE NEGATIVE Final    Comment: (NOTE) SARS-CoV-2 target nucleic acids are NOT DETECTED.  The SARS-CoV-2 RNA is generally detectable in upper respiratory specimens during the acute phase of infection. The lowest concentration of SARS-CoV-2 viral copies this assay can detect is 138 copies/mL. A negative result does not preclude SARS-Cov-2 infection and should not be used as the sole basis for treatment or other patient management decisions. A negative result may occur with  improper specimen collection/handling, submission of specimen other than nasopharyngeal swab, presence of viral mutation(s) within the areas targeted by this assay, and inadequate number of viral copies(<138 copies/mL). A negative result must be combined with clinical observations, patient history, and epidemiological information. The expected result is Negative.  Fact Sheet for Patients:  EntrepreneurPulse.com.au  Fact Sheet for Healthcare Providers:  IncredibleEmployment.be  This test is no t yet approved or cleared by the [place] FDA and  has been authorized for detection and/or diagnosis of SARS-CoV-2 by FDA under an Emergency Use Authorization (EUA). This EUA will remain  in effect (meaning this test can be used)  for the duration of the COVID-19 declaration under Section 564(b)(1) of the Act, 21 U.S.C.section 360bbb-3(b)(1), unless the authorization is terminated  or revoked sooner.       Influenza A by PCR NEGATIVE NEGATIVE Final   Influenza B by PCR NEGATIVE NEGATIVE Final    Comment: (NOTE) The Xpert Xpress SARS-CoV-2/FLU/RSV plus assay is intended as an aid in the diagnosis of influenza from Nasopharyngeal swab specimens and should not be used as a sole basis for treatment. Nasal washings and aspirates are unacceptable for Xpert Xpress SARS-CoV-2/FLU/RSV testing.  Fact Sheet for Patients: EntrepreneurPulse.com.au  Fact Sheet for Healthcare Providers: IncredibleEmployment.be  This test is not yet approved or cleared by the [place] FDA and has been authorized for detection and/or diagnosis of SARS-CoV-2 by FDA under an Emergency Use Authorization (EUA). This EUA will remain in effect (meaning this test can be used) for the duration of the COVID-19 declaration under Section 564(b)(1) of the Act, 21 U.S.C. section 360bbb-3(b)(1), unless the authorization is terminated or revoked.  Performed at [facility], [address] [place], [place] [address]   Blood Culture (routine x 2)     Status: Abnormal   Collection Time: 01/10/22  5:20 PM   Specimen: BLOOD RIGHT FOREARM  Result Value Ref Range Status   Specimen Description   Final    BLOOD RIGHT FOREARM Performed at [facility], [address], [place], [place] [address]    Special Requests   Final  BOTTLES DRAWN AEROBIC AND ANAEROBIC Blood Culture results may not be optimal due to an inadequate volume of blood received in culture bottles Performed at [facility], [address] [place], [place] [address]    Culture  Setup Time   Final    GRAM POSITIVE COCCI BOTTLES DRAWN AEROBIC AND ANAEROBIC Gram Stain Report Called to,Read Back By and Verified With: FERRAINLO,[email or web address]  BT MATTHEWS,B  7.19.2023 Performed at [facility], [address] [place], [place] [address]    Culture (A)  Final    GROUP B STREP(S.AGALACTIAE)ISOLATED SUSCEPTIBILITIES PERFORMED ON PREVIOUS CULTURE WITHIN THE LAST 5 DAYS. Performed at [facility], [place] [address] [place], [place] [address]    Report Status 01/13/2022 FINAL  Final  Blood Culture (routine x 2)     Status: Abnormal   Collection Time: 01/10/22  5:20 PM   Specimen: BLOOD  Result Value Ref Range Status   Specimen Description   Final    BLOOD RIGHT ANTECUBITAL Performed at [facility], [place] [address], [place], [place] [address]    Special Requests   Final    BOTTLES DRAWN AEROBIC AND ANAEROBIC Blood Culture adequate volume Performed at [facility], [address], [place], [place] [address]    Culture  Setup Time   Final    GRAM POSITIVE COCCI BOTTLES DRAWN AEROBIC AND ANAEROBIC Gram Stain Report Called to,Read Back By and Verified With: FERRAINLO,[email or web address]  BY MAATHEWS,B 7.19.2023 Organism ID to follow CRITICAL RESULT CALLED TO, READ BACK BY AND VERIFIED WITH: Chucky May PHARMD, AT [facility] Performed at [facility], [place] [address] [place], [place] [address]    Culture GROUP B STREP(S.AGALACTIAE)ISOLATED (A)  Final   Report Status 01/13/2022 FINAL  Final   Organism ID, Bacteria GROUP B STREP(S.AGALACTIAE)ISOLATED  Final      Susceptibility   Group b strep(s.agalactiae)isolated - MIC*    CLINDAMYCIN <=0.25 SENSITIVE Sensitive     AMPICILLIN <=0.25 SENSITIVE Sensitive     ERYTHROMYCIN 0.5 INTERMEDIATE Intermediate     VANCOMYCIN 0.5 SENSITIVE Sensitive     CEFTRIAXONE <=0.12 SENSITIVE Sensitive     LEVOFLOXACIN 0.5 SENSITIVE Sensitive     PENICILLIN Value in next row Sensitive      SENSITIVE0.12    * GROUP B STREP(S.AGALACTIAE)ISOLATED  Blood Culture ID Panel (Reflexed)     Status: Abnormal   Collection Time: 01/10/22  5:20 PM  Result Value Ref Range Status   Enterococcus faecalis NOT DETECTED  NOT DETECTED Final   Enterococcus Faecium NOT DETECTED NOT DETECTED Final   Listeria monocytogenes NOT DETECTED NOT DETECTED Final   Staphylococcus species NOT DETECTED NOT DETECTED Final   Staphylococcus aureus (BCID) NOT DETECTED NOT DETECTED Final   Staphylococcus epidermidis NOT DETECTED NOT DETECTED Final   Staphylococcus lugdunensis NOT DETECTED NOT DETECTED Final   Streptococcus species DETECTED (A) NOT DETECTED Final    Comment: CRITICAL RESULT CALLED TO, READ BACK BY AND VERIFIED WITH: G. COFFEE PHARMD, AT [phone] [id number] D. VANHOOK    Streptococcus agalactiae DETECTED (A) NOT DETECTED Final    Comment: CRITICAL RESULT CALLED TO, READ BACK BY AND VERIFIED WITH: G. COFFEE PHARMD, AT [id number] [id number] D. VANHOOK    Streptococcus pneumoniae NOT DETECTED NOT DETECTED Final   Streptococcus pyogenes NOT DETECTED NOT DETECTED Final   A.calcoaceticus-baumannii NOT DETECTED NOT DETECTED Final   Bacteroides fragilis NOT DETECTED NOT DETECTED Final   Enterobacterales NOT DETECTED NOT DETECTED Final   Enterobacter cloacae complex NOT DETECTED NOT  DETECTED Final   Escherichia coli NOT DETECTED NOT DETECTED Final   Klebsiella aerogenes NOT DETECTED NOT DETECTED Final   Klebsiella oxytoca NOT DETECTED NOT DETECTED Final   Klebsiella pneumoniae NOT DETECTED NOT DETECTED Final   Proteus species NOT DETECTED NOT DETECTED Final   Salmonella species NOT DETECTED NOT DETECTED Final   Serratia marcescens NOT DETECTED NOT DETECTED Final   Haemophilus influenzae NOT DETECTED NOT DETECTED Final   Neisseria meningitidis NOT DETECTED NOT DETECTED Final   Pseudomonas aeruginosa NOT DETECTED NOT DETECTED Final   Stenotrophomonas maltophilia NOT DETECTED NOT DETECTED Final   Candida albicans NOT DETECTED NOT DETECTED Final   Candida auris NOT DETECTED NOT DETECTED Final   Candida glabrata NOT DETECTED NOT DETECTED Final   Candida krusei NOT DETECTED NOT DETECTED Final   Candida parapsilosis NOT DETECTED NOT  DETECTED Final   Candida tropicalis NOT DETECTED NOT DETECTED Final   Cryptococcus neoformans/gattii NOT DETECTED NOT DETECTED Final    Comment: Performed at [facility], [place] [address] [place], [place] [address]  Urine Culture     Status: Abnormal   Collection Time: 01/10/22  5:48 PM   Specimen: Urine, Clean Catch  Result Value Ref Range Status   Specimen Description   Final    URINE, CLEAN CATCH Performed at [facility], [address] [place], [place] [address]    Special Requests   Final    NONE Performed at [facility], [address], [place], [place] [address]    Culture (A)  Final    >=100,000 COLONIES/mL ESCHERICHIA COLI Confirmed Extended Spectrum Beta-Lactamase Producer (ESBL).  In bloodstream infections from ESBL organisms, carbapenems are preferred over piperacillin/tazobactam. They are shown to have a lower risk of mortality.    Report Status 01/13/2022 FINAL  Final   Organism ID, Bacteria ESCHERICHIA COLI (A)  Final      Susceptibility   Escherichia coli - MIC*    AMPICILLIN >=32 RESISTANT Resistant     CEFAZOLIN >=64 RESISTANT Resistant     CEFEPIME >=32 RESISTANT Resistant     CEFTRIAXONE >=64 RESISTANT Resistant     CIPROFLOXACIN >=4 RESISTANT Resistant     GENTAMICIN >=16 RESISTANT Resistant     IMIPENEM <=0.25 SENSITIVE Sensitive     NITROFURANTOIN 128 RESISTANT Resistant     TRIMETH/SULFA >=320 RESISTANT Resistant     AMPICILLIN/SULBACTAM >=32 RESISTANT Resistant     PIP/TAZO 16 SENSITIVE Sensitive     * >=100,000 COLONIES/mL ESCHERICHIA COLI  Culture, blood (x 2)     Status: None   Collection Time: 01/11/22  7:48 AM   Specimen: BLOOD  Result Value Ref Range Status   Specimen Description   Final    BLOOD BLOOD RIGHT HAND Performed at [facility], [address], [place], [place] [address]    Special Requests   Final    BOTTLES DRAWN AEROBIC AND ANAEROBIC Blood Culture results may not be optimal due to an inadequate  volume of blood received in culture bottles Performed at [facility], [address], [place], [place] [address]    Culture   Final    NO GROWTH 5 DAYS Performed at [facility], [address] [place], [place] [address]    Report Status 01/16/2022 FINAL  Final  Culture, blood (x 2)     Status: None   Collection Time: 01/11/22  7:58 AM   Specimen: BLOOD  Result Value Ref Range Status   Specimen Description  Final    BLOOD BLOOD RIGHT HAND Performed at [facility], [address] [place], [place] [address]    Special Requests   Final    BOTTLES DRAWN AEROBIC AND ANAEROBIC Blood Culture results may not be optimal due to an inadequate volume of blood received in culture bottles Performed at [facility], [address] [place], [place] [address]    Culture   Final    NO GROWTH 5 DAYS Performed at [facility], [address], [place], [place] [address]    Report Status 01/16/2022 FINAL  Final  MRSA Next Gen by PCR, Nasal     Status: Abnormal   Collection Time: 01/11/22 11:02 AM   Specimen: Nasal Mucosa; Nasal Swab  Result Value Ref Range Status   MRSA by PCR Next Gen DETECTED (A) NOT DETECTED Final    Comment: RESULT CALLED TO, READ BACK BY AND VERIFIED WITH: DANNY GREBER @ [phone] ON 01/11/22 C VARNER (NOTE) The GeneXpert MRSA Assay (FDA approved for NASAL specimens only), is one component of a comprehensive MRSA colonization surveillance program. It is not intended to diagnose MRSA infection nor to guide or monitor treatment for MRSA infections. Test performance is not FDA approved in patients less than 9 years old. Performed at [facility], [address] [place], [place] [address]   Culture, blood (Routine X 2) w Reflex to ID Panel     Status: None   Collection Time: 01/12/22  1:53 AM   Specimen: BLOOD RIGHT FOREARM  Result Value Ref Range Status   Specimen Description BLOOD RIGHT FOREARM  Final    Special Requests   Final    BOTTLES DRAWN AEROBIC ONLY Blood Culture adequate volume   Culture   Final    NO GROWTH 5 DAYS Performed at [facility], [address] [place], [place] [address]    Report Status 01/17/2022 FINAL  Final  Culture, blood (Routine X 2) w Reflex to ID Panel     Status: None   Collection Time: 01/12/22  1:53 AM   Specimen: Right Antecubital; Blood  Result Value Ref Range Status   Specimen Description RIGHT ANTECUBITAL  Final   Special Requests   Final    BOTTLES DRAWN AEROBIC AND ANAEROBIC Blood Culture results may not be optimal due to an excessive volume of blood received in culture bottles   Culture   Final    NO GROWTH 5 DAYS Performed at [facility], [address] [place], [place] [address]    Report Status 01/17/2022 FINAL  Final    Radiology Reports DG CHEST PORT 1 VIEW  Result Date: 01/16/2022 CLINICAL DATA:  Short of breath EXAM: PORTABLE CHEST 1 VIEW COMPARISON:  01/10/2022 FINDINGS: Normal cardiac silhouette. New opacity in the RIGHT lower lobe with obscuration of the RIGHT hemidiaphragm and RIGHT heart border. LEFT lung clear. No pneumothorax. IMPRESSION: New RIGHT effusion with atelectasis or infiltrate. Electronically Signed   By: Suzy Bouchard M.D.   On: 01/16/2022 18:17   US ARTERIAL ABI (SCREENING LOWER EXTREMITY)  Result Date: 01/16/2022 CLINICAL DATA:  Diabetes, bilateral ankle osteomyelitis EXAM: NONINVASIVE PHYSIOLOGIC VASCULAR STUDY OF BILATERAL LOWER EXTREMITIES TECHNIQUE: Evaluation of both lower extremities were performed at rest, including calculation of ankle-brachial indices with single level Doppler, pressure and pulse volume recording. COMPARISON:  03/03/2019 FINDINGS: Right ABI:  1.49 (previously 1.45) Left ABI:  1.63 (previously 1.43) Right Lower Extremity:  Multiphasic arterial waveforms at the ankle. Left Lower Extremity: Normal  arterial waveform in the dorsalis pedis, monophasic in the distal posterior tibial. IMPRESSION:  Non diagnostic secondary to incompressible vessel calcifications (medial arterial sclerosis of Monckeberg). Electronically Signed   By: Lucrezia Europe M.D.   On: 01/16/2022 15:37   ECHOCARDIOGRAM LIMITED  Result Date: 01/16/2022    ECHOCARDIOGRAM LIMITED REPORT   Patient Name:   Danny [name] Date of Exam: 01/16/2022 Medical Rec #:  [id number]     Height:       73.0 in Accession #:    [id number]    Weight:       263.2 lb Date of Birth:  1955/12/13      BSA:          2.418 m Patient Age:    1 years      BP:           115/64 mmHg Patient Gender: M             HR:           80 bpm. Exam Location:  [name] Procedure: Limited Echo Indications:    Eval LVEF  History:        Patient has prior history of Echocardiogram examinations.                 Arrythmias:Atrial Fibrillation; Risk Factors:Hypertension and                 Diabetes. ESRD on dialysis [facility]).  Sonographer:    Alvino Chapel RCS Referring Phys: [place]  1. Limited study.  2. Left ventricular ejection fraction, by estimation, is 55 to 60%. The left ventricle has normal function. The left ventricle has no regional wall motion abnormalities. There is mild left ventricular hypertrophy. There is the interventricular septum is  flattened in systole and diastole, consistent with right ventricular pressure and volume overload.  3. Right ventricular systolic function is moderately reduced. The right ventricular size is moderately enlarged.  4. The mitral valve is abnormal.  5. The inferior vena cava is dilated in size with <50% respiratory variability, suggesting right atrial pressure of 15 mmHg. Comparison(s): Prior images reviewed side by side. LVEF remains normal. Moderate RV dysfunction, RVSP was not calculated. FINDINGS  Left Ventricle: Left ventricular ejection fraction, by estimation, is 55 to 60%. The left ventricle has normal function. The left ventricle has no regional wall motion abnormalities. The left ventricular internal cavity  size was normal in size. There is  mild left ventricular hypertrophy. The interventricular septum is flattened in systole and diastole, consistent with right ventricular pressure and volume overload. Right Ventricle: The right ventricular size is moderately enlarged. No increase in right ventricular wall thickness. Right ventricular systolic function is moderately reduced. Pericardium: There is no evidence of pericardial effusion. Mitral Valve: The mitral valve is abnormal. There is mild thickening of the mitral valve leaflet(s). There is mild calcification of the mitral valve leaflet(s). Aorta: The aortic root is normal in size and structure. Venous: The inferior vena cava is dilated in size with less than 50% respiratory variability, suggesting right atrial pressure of 15 mmHg. LEFT VENTRICLE PLAX 2D LVIDd:         5.10 cm LVIDs:         3.30 cm LV PW:         1.20 cm LV IVS:        1.30 cm LVOT diam:     2.00 cm LVOT Area:     3.14 cm  LEFT ATRIUM         Index LA diam:    3.80 cm 1.57 cm/m   AORTA Ao Root diam: 3.80 cm  SHUNTS Systemic Diam: 2.00 cm Rozann Lesches MD Electronically signed by Rozann Lesches MD Signature Date/Time: 01/16/2022/1:41:10 PM    Final     SIGNED: Deatra James, MD, FHM. [facility],  Pager (please use [email or web address] to page/text) Please use Epic Secure Chat for non-urgent communication (7AM-7PM)  If 7PM-7AM, please contact night-coverage [email or web address], 01/17/2022, 12:06 PM

## 2022-01-17 NOTE — Progress Notes (Signed)
Subjective:   Last HD on 7/22 with a UF of 3 kg.  He declined an extra treatment yesterday for overload.  Labs were ordered this am but never obtained.  I asked if we could do an additional HD treatment tomorrow to optimize volume status and he at first asks if it's ok to do two days in a row but then does agree.   Review of systems:    States shortness of breath   Denies chest pain  Denies n/v   Objective Vital signs in last 24 hours: Vitals:   01/16/22 1800 01/16/22 2027 01/16/22 2326 01/17/22 0424  BP: 109/79 (!) 106/92  119/63  Pulse: 72 74  74  Resp: (!) 22 (!) 21  20  Temp: 97.6 F (36.4 C) 98.8 F (37.1 C)  98.4 F (36.9 C)  TempSrc:      SpO2: 98% 91% 94% 97%  Weight:      Height:       Weight change:   Intake/Output Summary (Last 24 hours) at 01/17/2022 0240 Last data filed at 01/16/2022 1700 Gross per 24 hour  Intake 380.92 ml  Output --  Net 380.92 ml   Dialysis Orders: Center: [facility]  on TTS . EDW 110 kg HD Bath 2K/2.5Ca  Time 4 hours Heparin none. Access LUE AVF BFR 400 DFR 500    Venofer  50 mg IV once a week     Assessment/ Plan: Pt is a 66 y.o. yo male with ESRD who was admitted on 01/10/2022 with fever, weakness-  sepsis syndrome   Assessment/Plan: 1. Fever/presumed sepsis-  felt due to leg wound. abx per primary team.   2. ESRD -  continue HD per TTS schedule.  Outpatient he is at [facility]. Await am labs.  Additional HD tomorrow as well to optimize.    3. Anemia CKD -   On ESA - aranesp 100 mcg every Thursday    4. Secondary hyperparathyroidism- cont renvela-  is not on vit D or sensipar it seems   5. HTN/volume-  fluid volume overload.  Would lower EDW as an outpatient  6. Afib - meds per primary team   Dispo - per primary team   Labs: Basic Metabolic Panel: Recent Labs  Lab 01/11/22 0337 01/11/22 0749 01/14/22 0230 01/15/22 0435 01/16/22 0547  NA 128*   < > 129* 131* 128*  K 4.3   < > 5.4* 4.5 5.2*  CL 95*   < > 97* 95*  91*  CO2 21*   < > '22 27 27  '$ GLUCOSE 314*   < > 165* 154* 135*  BUN 52*   < > 54* 36* 44*  CREATININE 4.26*   < > 4.24* 3.11* 3.75*  CALCIUM 7.3*   < > 7.5* 7.7* 7.8*  PHOS 4.5  --   --   --   --    < > = values in this interval not displayed.   Liver Function Tests: Recent Labs  Lab 01/12/22 0940 01/13/22 0353 01/14/22 0230  AST 26 37 37  ALT '19 23 27  '$ ALKPHOS 136* 180* 198*  BILITOT 1.7* 1.9* 1.8*  PROT 5.9* 5.6* 6.0*  ALBUMIN 1.6* <1.5* 1.6*   No results for input(s): "LIPASE", "AMYLASE" in the last 168 hours. No results for input(s): "AMMONIA" in the last 168 hours. CBC: Recent Labs  Lab 01/10/22 1713 01/11/22 0337 01/11/22 0749 01/12/22 0940 01/13/22 0353 01/14/22 0230 01/15/22 0435 01/16/22 0547  WBC 10.6*   < >  19.1* 11.9* 10.2 11.3* 10.4 13.2*  NEUTROABS 10.1*  --  16.6*  --   --   --   --   --   HGB 9.6*   < > 9.5* 7.8* 7.8* 8.7* 8.5* 9.8*  HCT 29.7*   < > 30.2* 23.8* 24.3* 27.1* 26.6* 30.5*  MCV 84.1   < > 85.1 82.9 82.9 82.9 82.1 82.2  PLT 251   < > 280 243 229 267 279 338   < > = values in this interval not displayed.   Cardiac Enzymes: No results for input(s): "CKTOTAL", "CKMB", "CKMBINDEX", "TROPONINI" in the last 168 hours. CBG: Recent Labs  Lab 01/16/22 0704 01/16/22 1113 01/16/22 1554 01/16/22 2213 01/17/22 0713  GLUCAP 117* 172* 172* 191* 141*    Iron Studies: No results for input(s): "IRON", "TIBC", "TRANSFERRIN", "FERRITIN" in the last 72 hours. Studies/Results: DG CHEST PORT 1 VIEW  Result Date: 01/16/2022 CLINICAL DATA:  Short of breath EXAM: PORTABLE CHEST 1 VIEW COMPARISON:  01/10/2022 FINDINGS: Normal cardiac silhouette. New opacity in the RIGHT lower lobe with obscuration of the RIGHT hemidiaphragm and RIGHT heart border. LEFT lung clear. No pneumothorax. IMPRESSION: New RIGHT effusion with atelectasis or infiltrate. Electronically Signed   By: Suzy Bouchard M.D.   On: 01/16/2022 18:17   US ARTERIAL ABI (SCREENING LOWER  EXTREMITY)  Result Date: 01/16/2022 CLINICAL DATA:  Diabetes, bilateral ankle osteomyelitis EXAM: NONINVASIVE PHYSIOLOGIC VASCULAR STUDY OF BILATERAL LOWER EXTREMITIES TECHNIQUE: Evaluation of both lower extremities were performed at rest, including calculation of ankle-brachial indices with single level Doppler, pressure and pulse volume recording. COMPARISON:  03/03/2019 FINDINGS: Right ABI:  1.49 (previously 1.45) Left ABI:  1.63 (previously 1.43) Right Lower Extremity:  Multiphasic arterial waveforms at the ankle. Left Lower Extremity: Normal arterial waveform in the dorsalis pedis, monophasic in the distal posterior tibial. IMPRESSION: Non diagnostic secondary to incompressible vessel calcifications (medial arterial sclerosis of Monckeberg). Electronically Signed   By: Lucrezia Europe M.D.   On: 01/16/2022 15:37   ECHOCARDIOGRAM LIMITED  Result Date: 01/16/2022    ECHOCARDIOGRAM LIMITED REPORT   Patient Name:   Danny [name] Date of Exam: 01/16/2022 Medical Rec #:  [id number]     Height:       73.0 in Accession #:    [id number]    Weight:       263.2 lb Date of Birth:  1956/01/25      BSA:          2.418 m Patient Age:    60 years      BP:           115/64 mmHg Patient Gender: M             HR:           80 bpm. Exam Location:  [name] Procedure: Limited Echo Indications:    Eval LVEF  History:        Patient has prior history of Echocardiogram examinations.                 Arrythmias:Atrial Fibrillation; Risk Factors:Hypertension and                 Diabetes. ESRD on dialysis [facility]).  Sonographer:    Alvino Chapel RCS Referring Phys: [place]  1. Limited study.  2. Left ventricular ejection fraction, by estimation, is 55 to 60%. The left ventricle has normal function. The left ventricle has no regional wall motion abnormalities. There is  mild left ventricular hypertrophy. There is the interventricular septum is  flattened in systole and diastole, consistent with right ventricular pressure  and volume overload.  3. Right ventricular systolic function is moderately reduced. The right ventricular size is moderately enlarged.  4. The mitral valve is abnormal.  5. The inferior vena cava is dilated in size with <50% respiratory variability, suggesting right atrial pressure of 15 mmHg. Comparison(s): Prior images reviewed side by side. LVEF remains normal. Moderate RV dysfunction, RVSP was not calculated. FINDINGS  Left Ventricle: Left ventricular ejection fraction, by estimation, is 55 to 60%. The left ventricle has normal function. The left ventricle has no regional wall motion abnormalities. The left ventricular internal cavity size was normal in size. There is  mild left ventricular hypertrophy. The interventricular septum is flattened in systole and diastole, consistent with right ventricular pressure and volume overload. Right Ventricle: The right ventricular size is moderately enlarged. No increase in right ventricular wall thickness. Right ventricular systolic function is moderately reduced. Pericardium: There is no evidence of pericardial effusion. Mitral Valve: The mitral valve is abnormal. There is mild thickening of the mitral valve leaflet(s). There is mild calcification of the mitral valve leaflet(s). Aorta: The aortic root is normal in size and structure. Venous: The inferior vena cava is dilated in size with less than 50% respiratory variability, suggesting right atrial pressure of 15 mmHg. LEFT VENTRICLE PLAX 2D LVIDd:         5.10 cm LVIDs:         3.30 cm LV PW:         1.20 cm LV IVS:        1.30 cm LVOT diam:     2.00 cm LVOT Area:     3.14 cm  LEFT ATRIUM         Index LA diam:    3.80 cm 1.57 cm/m   AORTA Ao Root diam: 3.80 cm  SHUNTS Systemic Diam: 2.00 cm Rozann Lesches MD Electronically signed by Rozann Lesches MD Signature Date/Time: 01/16/2022/1:41:10 PM    Final    Medications: Infusions:  sodium chloride Stopped (01/11/22 0915)   albumin human 60 mL/hr at 01/15/22 1542     ceFAZolin (ANCEF) IV 2 g (01/16/22 1653)    Scheduled Medications:  apixaban  5 mg Oral BID   Chlorhexidine Gluconate Cloth  6 each Topical Q0600   Chlorhexidine Gluconate Cloth  6 each Topical Q0600   Chlorhexidine Gluconate Cloth  6 each Topical Q0600   darbepoetin (ARANESP) injection - NON-DIALYSIS  100 mcg Subcutaneous Q Thu-1800   ferrous sulfate  325 mg Oral Daily   insulin aspart  0-6 Units Subcutaneous TID WC   insulin detemir  5 Units Subcutaneous Daily   metoprolol succinate  50 mg Oral Daily   midodrine  10 mg Oral BID   mupirocin ointment   Nasal BID   omega-3 acid ethyl esters  1 g Oral Daily   pantoprazole  40 mg Oral Daily   rosuvastatin  5 mg Oral QPM   senna  2 tablet Oral BID   sevelamer carbonate  800 mg Oral BID WC    have reviewed scheduled and prn medications.  Physical Exam:     General adult male in bed in no acute distress HEENT normocephalic atraumatic extraocular movements intact sclera anicteric Neck supple trachea midline Lungs clear but decreased to auscultation bilaterally normal work of breathing at rest Heart S1S2 no rub Abdomen soft nontender nondistended/obese habitus Extremities 2+ edema  arms and legs Psych normal mood and affect LUE AVF bruit and thrill   Claudia Desanctis, MD 01/17/2022,9:42 AM  LOS: 7 days

## 2022-01-17 NOTE — Progress Notes (Signed)
Patient transported to ICU from dialysis without complication.

## 2022-01-17 NOTE — Progress Notes (Signed)
Progress Note  Patient Name: Danny [name] Date of Encounter: 01/17/2022  [facility] Cardiologist: Jenkins Rouge, MD   Subjective   Appears somnolent today -says he has had some accessory muscle breathing today. Maintaining sinus rhythm with 1st degree AVB on metorpolol. Limited echo yesterday shows normal LVEF 55-60%, mild LVH, moderate RV dysfunction, dilated IVC.  Inpatient Medications    Scheduled Meds:  apixaban  5 mg Oral BID   Chlorhexidine Gluconate Cloth  6 each Topical Q0600   Chlorhexidine Gluconate Cloth  6 each Topical Q0600   Chlorhexidine Gluconate Cloth  6 each Topical Q0600   darbepoetin (ARANESP) injection - NON-DIALYSIS  100 mcg Subcutaneous Q Thu-1800   ferrous sulfate  325 mg Oral Daily   insulin aspart  0-6 Units Subcutaneous TID WC   insulin detemir  5 Units Subcutaneous Daily   metoprolol succinate  50 mg Oral Daily   midodrine  10 mg Oral BID   mupirocin ointment   Nasal BID   omega-3 acid ethyl esters  1 g Oral Daily   pantoprazole  40 mg Oral Daily   rosuvastatin  5 mg Oral QPM   senna  2 tablet Oral BID   sevelamer carbonate  800 mg Oral BID WC   Continuous Infusions:  sodium chloride Stopped (01/11/22 0915)   albumin human 60 mL/hr at 01/15/22 1542    ceFAZolin (ANCEF) IV 2 g (01/16/22 1653)   PRN Meds: acetaminophen **OR** acetaminophen, albumin human, ALPRAZolam, levalbuterol, metoprolol tartrate, ondansetron **OR** ondansetron (ZOFRAN) IV, pentafluoroprop-tetrafluoroeth, sodium chloride   Vital Signs    Vitals:   01/16/22 1800 01/16/22 2027 01/16/22 2326 01/17/22 0424  BP: 109/79 (!) 106/92  119/63  Pulse: 72 74  74  Resp: (!) 22 (!) 21  20  Temp: 97.6 F (36.4 C) 98.8 F (37.1 C)  98.4 F (36.9 C)  TempSrc:      SpO2: 98% 91% 94% 97%  Weight:      Height:        Intake/Output Summary (Last 24 hours) at 01/17/2022 0943 Last data filed at 01/16/2022 1700 Gross per 24 hour  Intake 380.92 ml  Output --  Net 380.92 ml       01/15/2022    4:00 AM 01/13/2022    5:00 AM  Last 3 Weights  Weight (lbs) 263 lb 3.7 oz 262 lb 12.6 oz  Weight (kg) 119.4 kg 119.2 kg      Telemetry    Sinus rhythm with 1st degree AVB - Personally Reviewed  ECG    N/A  Physical Exam   General appearance: delirious, fatigued, morbidly obese, and pale Neck: no carotid bruit, no JVD, and thyroid not enlarged, symmetric, no tenderness/mass/nodules Lungs: diminished breath sounds bilaterally Heart: regular rate and rhythm Abdomen: obese, soft, non-tender Extremities: extremities normal, atraumatic, no cyanosis or edema Pulses: 2+ and symmetric Skin: pale Neurologic: Mental status: somnolent, somewhat delirious Psych: cannot assess   Labs    Chemistry Recent Labs  Lab 01/11/22 0337 01/11/22 0749 01/12/22 0940 01/13/22 0353 01/14/22 0230 01/15/22 0435 01/16/22 0547  NA 128*   < > 131* 131* 129* 131* 128*  K 4.3   < > 4.7 4.2 5.4* 4.5 5.2*  CL 95*   < > 99 98 97* 95* 91*  CO2 21*   < > '22 26 22 27 27  '$ GLUCOSE 314*   < > 163* 165* 165* 154* 135*  BUN 52*   < > 70* 45* 54* 36* 44*  CREATININE 4.26*   < > 5.07* 3.61* 4.24* 3.11* 3.75*  CALCIUM 7.3*   < > 7.5* 7.4* 7.5* 7.7* 7.8*  MG 2.0  --   --  1.8  --   --   --   PROT 6.5   < > 5.9* 5.6* 6.0*  --   --   ALBUMIN 1.8*   < > 1.6* <1.5* 1.6*  --   --   AST 21   < > 26 37 37  --   --   ALT 18   < > '19 23 27  '$ --   --   ALKPHOS 162*   < > 136* 180* 198*  --   --   BILITOT 2.5*   < > 1.7* 1.9* 1.8*  --   --   GFRNONAA 14*   < > 12* 18* 14* 21* 17*  ANIONGAP 12   < > '10 7 10 9 10   '$ < > = values in this interval not displayed.    Hematology Recent Labs  Lab 01/14/22 0230 01/15/22 0435 01/16/22 0547  WBC 11.3* 10.4 13.2*  RBC 3.27* 3.24* 3.71*  HGB 8.7* 8.5* 9.8*  HCT 27.1* 26.6* 30.5*  MCV 82.9 82.1 82.2  MCH 26.6 26.2 26.4  MCHC 32.1 32.0 32.1  RDW 19.9* 19.9* 20.3*  PLT 267 279 338    Radiology    DG CHEST PORT 1 VIEW  Result Date: 01/16/2022 CLINICAL  DATA:  Short of breath EXAM: PORTABLE CHEST 1 VIEW COMPARISON:  01/10/2022 FINDINGS: Normal cardiac silhouette. New opacity in the RIGHT lower lobe with obscuration of the RIGHT hemidiaphragm and RIGHT heart border. LEFT lung clear. No pneumothorax. IMPRESSION: New RIGHT effusion with atelectasis or infiltrate. Electronically Signed   By: Suzy Bouchard M.D.   On: 01/16/2022 18:17   US ARTERIAL ABI (SCREENING LOWER EXTREMITY)  Result Date: 01/16/2022 CLINICAL DATA:  Diabetes, bilateral ankle osteomyelitis EXAM: NONINVASIVE PHYSIOLOGIC VASCULAR STUDY OF BILATERAL LOWER EXTREMITIES TECHNIQUE: Evaluation of both lower extremities were performed at rest, including calculation of ankle-brachial indices with single level Doppler, pressure and pulse volume recording. COMPARISON:  03/03/2019 FINDINGS: Right ABI:  1.49 (previously 1.45) Left ABI:  1.63 (previously 1.43) Right Lower Extremity:  Multiphasic arterial waveforms at the ankle. Left Lower Extremity: Normal arterial waveform in the dorsalis pedis, monophasic in the distal posterior tibial. IMPRESSION: Non diagnostic secondary to incompressible vessel calcifications (medial arterial sclerosis of [place]). Electronically Signed   By: Lucrezia Europe M.D.   On: 01/16/2022 15:37   ECHOCARDIOGRAM LIMITED  Result Date: 01/16/2022    ECHOCARDIOGRAM LIMITED REPORT   Patient Name:   Danny [name] Date of Exam: 01/16/2022 Medical Rec #:  [id number]     Height:       73.0 in Accession #:    [id number]    Weight:       263.2 lb Date of Birth:  1955-11-12      BSA:          2.418 m Patient Age:    66 years      BP:           115/64 mmHg Patient Gender: M             HR:           80 bpm. Exam Location:  [name] Procedure: Limited Echo Indications:    Eval LVEF  History:        Patient has prior history of Echocardiogram examinations.  Arrythmias:Atrial Fibrillation; Risk Factors:Hypertension and                 Diabetes. ESRD on dialysis [facility]).   Sonographer:    Alvino Chapel RCS Referring Phys: [place]  1. Limited study.  2. Left ventricular ejection fraction, by estimation, is 55 to 60%. The left ventricle has normal function. The left ventricle has no regional wall motion abnormalities. There is mild left ventricular hypertrophy. There is the interventricular septum is  flattened in systole and diastole, consistent with right ventricular pressure and volume overload.  3. Right ventricular systolic function is moderately reduced. The right ventricular size is moderately enlarged.  4. The mitral valve is abnormal.  5. The inferior vena cava is dilated in size with <50% respiratory variability, suggesting right atrial pressure of 15 mmHg. Comparison(s): Prior images reviewed side by side. LVEF remains normal. Moderate RV dysfunction, RVSP was not calculated. FINDINGS  Left Ventricle: Left ventricular ejection fraction, by estimation, is 55 to 60%. The left ventricle has normal function. The left ventricle has no regional wall motion abnormalities. The left ventricular internal cavity size was normal in size. There is  mild left ventricular hypertrophy. The interventricular septum is flattened in systole and diastole, consistent with right ventricular pressure and volume overload. Right Ventricle: The right ventricular size is moderately enlarged. No increase in right ventricular wall thickness. Right ventricular systolic function is moderately reduced. Pericardium: There is no evidence of pericardial effusion. Mitral Valve: The mitral valve is abnormal. There is mild thickening of the mitral valve leaflet(s). There is mild calcification of the mitral valve leaflet(s). Aorta: The aortic root is normal in size and structure. Venous: The inferior vena cava is dilated in size with less than 50% respiratory variability, suggesting right atrial pressure of 15 mmHg. LEFT VENTRICLE PLAX 2D LVIDd:         5.10 cm LVIDs:         3.30 cm LV PW:          1.20 cm LV IVS:        1.30 cm LVOT diam:     2.00 cm LVOT Area:     3.14 cm  LEFT ATRIUM         Index LA diam:    3.80 cm 1.57 cm/m   AORTA Ao Root diam: 3.80 cm  SHUNTS Systemic Diam: 2.00 cm Rozann Lesches MD Electronically signed by Rozann Lesches MD Signature Date/Time: 01/16/2022/1:41:10 PM    Final     Cardiac Studies   Echocardiogram: 10/2020 IMPRESSIONS    1. Left ventricular ejection fraction, by estimation, is 55 to 60%. The  left ventricle has normal function. Left ventricular endocardial border  not optimally defined to evaluate regional wall motion. There is moderate  left ventricular hypertrophy. Left  ventricular diastolic parameters are indeterminate.   2. Right ventricular systolic function is mildly reduced. The right  ventricular size is moderately enlarged. There is severely elevated  pulmonary artery systolic pressure. The estimated right ventricular  systolic pressure is 14.4 mmHg.   3. Left atrial size was mildly dilated.   4. Right atrial size was moderately dilated.   5. The mitral valve is normal in structure. Mild mitral valve  regurgitation. No evidence of mitral stenosis.   6. The aortic valve is grossly normal. Aortic valve regurgitation is not  visualized. No aortic stenosis is present.   7. The inferior vena cava is dilated in size with <50% respiratory  variability,  suggesting right atrial pressure of 15 mmHg.    Venous dopplers with no evidence of DVT 01/11/22  Patient Profile     65 y.o. male  with a hx of HTN, HLD, Type 2 DM, persistent atrial fibrillation (appears this was diagnosed in 07/2021 while admitted for anemia and recurrence in 09/2021 while admitted for osteomyelitis) and ESRD now with septic shock, acute metabolic encephalopathy and afib RVR.   Assessment & Plan    Atrial fib with RVR  --recurrent issue this year.  Now recurrent with septic shock.  --limited echo shows normal LVEF with moderate RV dysfunction --maintaining  SR and BP somewhat labile but stable.  - volume status per nephrology  HTN --has been hypotensive but improved though labile at times. With midodrine he is tolerating the BB  Septic shock in setting of E coli UTI and cellulitis --off pressors, and ABX and ID is following  --per ID and IM  ESRD on HD TTS per renal with volume overload   Altered mental status - appears encephalopathic today, ordered for Xanax, but has not been receiving it - may need further evaluation of this per primary service.  [facility] will sign off.   Medication Recommendations:  continue current meds Other recommendations (labs, testing, etc):  none Follow up as an outpatient:  Dr. Johnsie Cancel or APP     For questions or updates, please contact [facility] Please consult [email or web address] for contact info under   Pixie Casino, MD, FACC, [place] Director of the [facility] Diplomate of the American Board of Clinical Lipidology Attending Cardiologist  Direct Dial: [phone]  Fax: [phone]  Website:  [email or web address]  Pixie Casino, MD  01/17/2022, 9:43 AM

## 2022-01-17 NOTE — Progress Notes (Signed)
Patient transported to ICU from dialysis. Patient lethargic during dialysis requiring continuous bipap. Patient's sister notified by Dr. Rolly Shelter. Respiratory to obtain ABG. Blood sugar 127. HR 72. O2 sats 100% on 40% fiO2 via bipap. Blood pressure 130/88 (93). Temperature 95.7 rectally. Warm blankets applied to patient. Patient awakens to voice. No complaints. Tolerating bipap well.

## 2022-01-17 NOTE — Progress Notes (Signed)
In to check BIPAP patient off and on 7lpm cann o2 decreased to 5lpm cann and humidify added to cannula patient vitals are within normal range. RT will continue to monitor spo2 98-100%

## 2022-01-18 ENCOUNTER — Ambulatory Visit: Payer: Medicare Other | Admitting: Vascular Surgery

## 2022-01-18 ENCOUNTER — Other Ambulatory Visit (HOSPITAL_COMMUNITY): Payer: Medicare Other

## 2022-01-18 DIAGNOSIS — J9601 Acute respiratory failure with hypoxia: Secondary | ICD-10-CM | POA: Diagnosis not present

## 2022-01-18 DIAGNOSIS — G9341 Metabolic encephalopathy: Secondary | ICD-10-CM | POA: Diagnosis not present

## 2022-01-18 DIAGNOSIS — Z515 Encounter for palliative care: Secondary | ICD-10-CM | POA: Diagnosis not present

## 2022-01-18 DIAGNOSIS — N186 End stage renal disease: Secondary | ICD-10-CM | POA: Diagnosis not present

## 2022-01-18 DIAGNOSIS — M86679 Other chronic osteomyelitis, unspecified ankle and foot: Secondary | ICD-10-CM | POA: Diagnosis not present

## 2022-01-18 DIAGNOSIS — G9340 Encephalopathy, unspecified: Secondary | ICD-10-CM | POA: Diagnosis not present

## 2022-01-18 DIAGNOSIS — L03119 Cellulitis of unspecified part of limb: Secondary | ICD-10-CM | POA: Diagnosis not present

## 2022-01-18 DIAGNOSIS — I482 Chronic atrial fibrillation, unspecified: Secondary | ICD-10-CM | POA: Diagnosis not present

## 2022-01-18 DIAGNOSIS — Z7189 Other specified counseling: Secondary | ICD-10-CM | POA: Diagnosis not present

## 2022-01-18 DIAGNOSIS — A419 Sepsis, unspecified organism: Secondary | ICD-10-CM | POA: Diagnosis not present

## 2022-01-18 LAB — GLUCOSE, CAPILLARY
Glucose-Capillary: 111 mg/dL — ABNORMAL HIGH (ref 70–99)
Glucose-Capillary: 111 mg/dL — ABNORMAL HIGH (ref 70–99)
Glucose-Capillary: 113 mg/dL — ABNORMAL HIGH (ref 70–99)
Glucose-Capillary: 123 mg/dL — ABNORMAL HIGH (ref 70–99)
Glucose-Capillary: 148 mg/dL — ABNORMAL HIGH (ref 70–99)
Glucose-Capillary: 96 mg/dL (ref 70–99)

## 2022-01-18 LAB — CBC
HCT: 27.2 % — ABNORMAL LOW (ref 39.0–52.0)
Hemoglobin: 8.8 g/dL — ABNORMAL LOW (ref 13.0–17.0)
MCH: 26.8 pg (ref 26.0–34.0)
MCHC: 32.4 g/dL (ref 30.0–36.0)
MCV: 82.9 fL (ref 80.0–100.0)
Platelets: 304 10*3/uL (ref 150–400)
RBC: 3.28 MIL/uL — ABNORMAL LOW (ref 4.22–5.81)
RDW: 21.4 % — ABNORMAL HIGH (ref 11.5–15.5)
WBC: 10.5 10*3/uL (ref 4.0–10.5)
nRBC: 1 % — ABNORMAL HIGH (ref 0.0–0.2)

## 2022-01-18 LAB — BASIC METABOLIC PANEL
Anion gap: 13 (ref 5–15)
BUN: 41 mg/dL — ABNORMAL HIGH (ref 8–23)
CO2: 24 mmol/L (ref 22–32)
Calcium: 7.8 mg/dL — ABNORMAL LOW (ref 8.9–10.3)
Chloride: 92 mmol/L — ABNORMAL LOW (ref 98–111)
Creatinine, Ser: 3.6 mg/dL — ABNORMAL HIGH (ref 0.61–1.24)
GFR, Estimated: 18 mL/min — ABNORMAL LOW (ref >60)
Glucose, Bld: 118 mg/dL — ABNORMAL HIGH (ref 70–99)
Potassium: 5.6 mmol/L — ABNORMAL HIGH (ref 3.5–5.1)
Sodium: 129 mmol/L — ABNORMAL LOW (ref 135–145)

## 2022-01-18 LAB — OCCULT BLOOD X 1 CARD TO LAB, STOOL: Fecal Occult Bld: POSITIVE — AB

## 2022-01-18 MED ORDER — BENZONATATE 100 MG PO CAPS: 200.0000 mg | ORAL_CAPSULE | Freq: Three times a day (TID) | ORAL | Status: DC | PRN

## 2022-01-18 MED ORDER — MIDODRINE HCL 5 MG PO TABS
20.0000 mg | ORAL_TABLET | Freq: Three times a day (TID) | ORAL | Status: DC
  Filled 2022-01-18: qty 4

## 2022-01-18 MED ORDER — SODIUM ZIRCONIUM CYCLOSILICATE 10 G PO PACK
10.0000 g | PACK | Freq: Every day | ORAL | Status: DC
  Administered 2022-01-18: 10 g via ORAL
  Filled 2022-01-18: qty 1

## 2022-01-18 MED ORDER — MIDODRINE HCL 5 MG PO TABS
20.0000 mg | ORAL_TABLET | Freq: Three times a day (TID) | ORAL | Status: DC
  Administered 2022-01-18 – 2022-01-19 (×3): 20 mg via ORAL
  Filled 2022-01-18 (×2): qty 4

## 2022-01-18 NOTE — Progress Notes (Signed)
PROGRESS NOTE  DANON LOGRASSO BWI:[id number] DOB: 1955-11-08 DOA: 01/10/2022 PCP: Celene Squibb, MD  Brief History:  Danny [name] is a 66 y.o. male with medical history significant of hypertension, ESRD (TTS), A-fib on Eliquis who presents to the emergency department via EMS from a nursing facility due to fever and weakness.   Patient states that he went to dialysis this morning, but no fluid was taken off him. Unable to provide further history, history was obtained from the ED physician and ED medical record.  Per report, patient was found to be hypoxic with O2 sat in the 80s, supplemental oxygen via [place] at 4 LPM was provided.  Patient was reported to be weak and confused today (different from baseline, good sense of humor, AOx3), he was reported to be fine during shift signout at the nursing facility.       ED Course:  Tmax of 102.27F, tachypneic, tachycardic.  Work-up in the ED showed normal CBC except for WBC of 10.6 BMP showed sodium 133, potassium 4.2, chloride 96, bicarb 26, glucose 129, BUN 55, creatinine 4.34, calcium 7.7, albumin 2.0, alkaline phosphatase 187, AST 22, ALT 18, total bilirubin 2.2, eGFR 14, lactic acid 2.0, sed rate 100.  Urinalysis was positive for large leukocytes, > 50 WBC, and many bacteria.  Influenza A, B, SARS coronavirus 2 was negative.  Blood culture pending. Chest x-ray showed possible small pleural effusions.  No overt interstitial edema. He was treated with IV cefepime, Flagyl, vancomycin, Zofran was given, IV hydration was provided.  Tylenol was given due to fever.  Patient's BP continued to be in hypotensive range, he was started on peripheral IV Levophed.   Hospitalist was asked to admit patient for further evaluation and management.     Assessment/Plan: Sepsis-septic shock -due to GBS bacteremia/osteomyelitis ankles -initially on levophed -Sepsis/septic shock physiology improving -Off Levophed, status post IV fluid resuscitation, IV  antibiotic -7/25--developed afib RVR, hemodynamic instability on HD 7/25>>2.2 L off -Blood cultures are growing group B strep,  - Urine culture growing E. Coli -ESBL -colonized per ID-- no further treatment   -Bilateral lower extremity MRI, consistent with severe arthritis with local wound infection consistent with possible bilateral chronic osteomyelitis -Recommended continue antibiotics of penicillin G initially -Discontinuing broad-spectrum antibiotics of vancomycin, Flagyl, cefepime discontinued on 01/12/2022 -penicillin G treatment for ID recommendation Per Dr. [name]  In Absent a below the knee amputation happening soon plan on giving him 8 weeks of systemic antibiotic Cefazolin with hemodialysis.  --patient is a poor surgical candidate presently --would favor long term abx therapy     Hyponatremia hypokalemia  -In the setting of end-stage renal disease -correction on HD -Nephrology consulted, appreciate close evaluation, hemodialysis And further input regarding prognosis   acute metabolic encephalopathy -continues to have waxing and waning mentation -- likely it was due to sepsis, septic shock -Continue treating underlying causes   Hypotension -acute on chronic  -Blood pressure remains soft but stable -Successfully weaned off Levophed -Continue midodrine>>dose increased   Right foot wound/bilateral ankle osteomyelitis Continue wound care -[place] lower extremity Doppler>>> No evidence of deep venous thrombosis in either lower extremity. -Bilateral lower extremity MRI, reporting significant inflammation, osteoarthritis, cannot rule out osteomyelitis -Orthopedic, Dr. Aline Brochure was consulted reviewed imaging, he will further consult Dr. Sharol Given for further evaluation and definitive management... Getting ABI   -Per Dr. [name]  In Absent a below the knee amputation happening soon plan on giving  him 8 weeks of systemic antibiotic Cefazolin with hemodialysis.   --patient is a poor  surgical candidate presently --would favor long term abx therapy   Lactic acidosis Lactic acid 2.0, >>3.0 >>2.4   continue to trend lactic acid   ESRD on HD (TTS) Continue [facility] Nephrology consulted for maintenance dialysis -Planning for hemodialysis 7/20, 7/22, 7/25, 7/26   Paroxysmal Atrial Fib with RVR Continue Eliquis Toprol-XL will be held at this time due to hypotension -Cardiology signed off   Type 2 diabetes mellitus Continue ISS and levemir 5   Iron deficiency anemia Continue ferrous sulfate Continue senna to prevent constipation  Mixed hyperlipidemia Continue statin Continue Lovaza   Obesity (BMI 32.66 kgm) Diet and lifestyle modification   Goals of Care -remains DNR -appreciate palliative following -01/18/22--pt expressed to me that he wished to pursue comfort measures if not able to tolerate HD today         Family Communication:   no Family at bedside  Consultants:  cardiology, renal, palliative, ID, Ortho  Code Status:  FULL / DNR  DVT Prophylaxis:  [place] Heparin / [place] Lovenox   Procedures: As Listed in Progress Note Above  Antibiotics: Pen G 7/20>>7/24 Cefazolin 7/24>>     Subjective: Pt complains of sob.  Denies f/c, cp, n/v/d, abd pain  Objective: Vitals:   01/18/22 1203 01/18/22 1204 01/18/22 1205 01/18/22 1206  BP: 102/73     Pulse:  90 89   Resp:   20   Temp:      TempSrc:      SpO2: 92%  92% 93%  Weight:      Height:        Intake/Output Summary (Last 24 hours) at 01/18/2022 1255 Last data filed at 01/17/2022 2300 Gross per 24 hour  Intake 253.56 ml  Output --  Net 253.56 ml   Weight change:  Exam:  General:  Pt is alert, follows commands appropriately, not in acute distress HEENT: No icterus, No thrush, No neck mass, [place]/AT Cardiovascular: RRR, S1/S2, no rubs, no gallops Respiratory: bibasilar rales. No wheeze Abdomen: Soft/+BS, non tender, non distended, no guarding Extremities: trace LE edema, No  lymphangitis, No petechiae, No rashes, no synovitis   Data Reviewed: I have personally reviewed following labs and imaging studies Basic Metabolic Panel: Recent Labs  Lab 01/13/22 0353 01/14/22 0230 01/15/22 0435 01/16/22 0547 01/17/22 1024 01/17/22 1728 01/18/22 0431  NA 131*   < > 131* 128* 125* 128* 129*  K 4.2   < > 4.5 5.2* 6.5* 4.5 5.6*  CL 98   < > 95* 91* 90* 91* 92*  CO2 26   < > 27 27 22 25 24   GLUCOSE 165*   < > 154* 135* 192* 137* 118*  BUN 45*   < > 36* 44* 56* 36* 41*  CREATININE 3.61*   < > 3.11* 3.75* 4.42* 3.06* 3.60*  CALCIUM 7.4*   < > 7.7* 7.8* 7.6* 7.7* 7.8*  MG 1.8  --   --   --   --   --   --    < > = values in this interval not displayed.   Liver Function Tests: Recent Labs  Lab 01/12/22 0940 01/13/22 0353 01/14/22 0230 01/17/22 1728  AST 26 37 37 16  ALT 19 23 27 17   ALKPHOS 136* 180* 198* 188*  BILITOT 1.7* 1.9* 1.8* 2.0*  PROT 5.9* 5.6* 6.0* 6.9  ALBUMIN 1.6* <1.5* 1.6* 2.1*   No results for input(s): "LIPASE", "  AMYLASE" in the last 168 hours. No results for input(s): "AMMONIA" in the last 168 hours. Coagulation Profile: No results for input(s): "INR", "PROTIME" in the last 168 hours. CBC: Recent Labs  Lab 01/15/22 0435 01/16/22 0547 01/17/22 1024 01/17/22 1728 01/18/22 0431  WBC 10.4 13.2* 13.1* 8.3 10.5  NEUTROABS  --   --   --  6.9  --   HGB 8.5* 9.8* 9.7* 9.0* 8.8*  HCT 26.6* 30.5* 30.0* 27.2* 27.2*  MCV 82.1 82.2 82.6 79.5* 82.9  PLT 279 338 394 298 304   Cardiac Enzymes: No results for input(s): "CKTOTAL", "CKMB", "CKMBINDEX", "TROPONINI" in the last 168 hours. BNP: Invalid input(s): "POCBNP" CBG: Recent Labs  Lab 01/17/22 1716 01/18/22 0002 01/18/22 0359 01/18/22 0725 01/18/22 1128  GLUCAP 127* 148* 123* 111* 113*   HbA1C: No results for input(s): "HGBA1C" in the last 72 hours. Urine analysis:    Component Value Date/Time   COLORURINE YELLOW 01/10/2022 1749   APPEARANCEUR TURBID (A) 01/10/2022 1749    APPEARANCEUR Clear 09/10/2020 1122   LABSPEC 1.010 01/10/2022 1749   PHURINE 6.0 01/10/2022 1749   GLUCOSEU NEGATIVE 01/10/2022 1749   HGBUR SMALL (A) 01/10/2022 1749   BILIRUBINUR NEGATIVE 01/10/2022 1749   BILIRUBINUR Negative 09/10/2020 1122   KETONESUR NEGATIVE 01/10/2022 1749   PROTEINUR 100 (A) 01/10/2022 1749   NITRITE NEGATIVE 01/10/2022 1749   LEUKOCYTESUR LARGE (A) 01/10/2022 1749   Sepsis Labs: @LABRCNTIP (procalcitonin:4,lacticidven:4) ) Recent Results (from the past 240 hour(s))  Resp Panel by RT-PCR (Flu A&B, Covid) Anterior Nasal Swab     Status: None   Collection Time: 01/10/22  5:20 PM   Specimen: Anterior Nasal Swab  Result Value Ref Range Status   SARS Coronavirus 2 by RT PCR NEGATIVE NEGATIVE Final    Comment: (NOTE) SARS-CoV-2 target nucleic acids are NOT DETECTED.  The SARS-CoV-2 RNA is generally detectable in upper respiratory specimens during the acute phase of infection. The lowest concentration of SARS-CoV-2 viral copies this assay can detect is 138 copies/mL. A negative result does not preclude SARS-Cov-2 infection and should not be used as the sole basis for treatment or other patient management decisions. A negative result may occur with  improper specimen collection/handling, submission of specimen other than nasopharyngeal swab, presence of viral mutation(s) within the areas targeted by this assay, and inadequate number of viral copies(<138 copies/mL). A negative result must be combined with clinical observations, patient history, and epidemiological information. The expected result is Negative.  Fact Sheet for Patients:  EntrepreneurPulse.com.au  Fact Sheet for Healthcare Providers:  IncredibleEmployment.be  This test is no t yet approved or cleared by the [place] FDA and  has been authorized for detection and/or diagnosis of SARS-CoV-2 by FDA under an Emergency Use Authorization (EUA). This EUA will  remain  in effect (meaning this test can be used) for the duration of the COVID-19 declaration under Section 564(b)(1) of the Act, 21 U.S.C.section 360bbb-3(b)(1), unless the authorization is terminated  or revoked sooner.       Influenza A by PCR NEGATIVE NEGATIVE Final   Influenza B by PCR NEGATIVE NEGATIVE Final    Comment: (NOTE) The Xpert Xpress SARS-CoV-2/FLU/RSV plus assay is intended as an aid in the diagnosis of influenza from Nasopharyngeal swab specimens and should not be used as a sole basis for treatment. Nasal washings and aspirates are unacceptable for Xpert Xpress SARS-CoV-2/FLU/RSV testing.  Fact Sheet for Patients: EntrepreneurPulse.com.au  Fact Sheet for Healthcare Providers: IncredibleEmployment.be  This test is not yet approved  or cleared by the [place] and has been authorized for detection and/or diagnosis of SARS-CoV-2 by FDA under an Emergency Use Authorization (EUA). This EUA will remain in effect (meaning this test can be used) for the duration of the COVID-19 declaration under Section 564(b)(1) of the Act, 21 U.S.C. section 360bbb-3(b)(1), unless the authorization is terminated or revoked.  Performed at [facility], [address] [place], [place] [address]   Blood Culture (routine x 2)     Status: Abnormal   Collection Time: 01/10/22  5:20 PM   Specimen: BLOOD RIGHT FOREARM  Result Value Ref Range Status   Specimen Description   Final    BLOOD RIGHT FOREARM Performed at [facility], [address], [place], [place] [address]    Special Requests   Final    BOTTLES DRAWN AEROBIC AND ANAEROBIC Blood Culture results may not be optimal due to an inadequate volume of blood received in culture bottles Performed at [facility], [address], [place], [place] [address]    Culture  Setup Time   Final    GRAM POSITIVE COCCI BOTTLES DRAWN AEROBIC AND ANAEROBIC Gram Stain Report Called to,Read Back By and  Verified With: FERRAINLO,[email or web address]  BT MATTHEWS,B 7.19.2023 Performed at [facility], [address], [place], [place] [address]    Culture (A)  Final    GROUP B STREP(S.AGALACTIAE)ISOLATED SUSCEPTIBILITIES PERFORMED ON PREVIOUS CULTURE WITHIN THE LAST 5 DAYS. Performed at [facility], [place] [address], [place], [place] [address]    Report Status 01/13/2022 FINAL  Final  Blood Culture (routine x 2)     Status: Abnormal   Collection Time: 01/10/22  5:20 PM   Specimen: BLOOD  Result Value Ref Range Status   Specimen Description   Final    BLOOD RIGHT ANTECUBITAL Performed at [facility], [place] [address], [place], [place] [address]    Special Requests   Final    BOTTLES DRAWN AEROBIC AND ANAEROBIC Blood Culture adequate volume Performed at [facility], [address], [place], [place] [address]    Culture  Setup Time   Final    GRAM POSITIVE COCCI BOTTLES DRAWN AEROBIC AND ANAEROBIC Gram Stain Report Called to,Read Back By and Verified With: FERRAINLO,[email or web address]  BY MAATHEWS,B 7.19.2023 Organism ID to follow CRITICAL RESULT CALLED TO, READ BACK BY AND VERIFIED WITH: Chucky May PHARMD, AT [place] Performed at [facility], [place] [address] [place], [place] [address]    Culture GROUP B STREP(S.AGALACTIAE)ISOLATED (A)  Final   Report Status 01/13/2022 FINAL  Final   Organism ID, Bacteria GROUP B STREP(S.AGALACTIAE)ISOLATED  Final      Susceptibility   Group b strep(s.agalactiae)isolated - MIC*    CLINDAMYCIN <=0.25 SENSITIVE Sensitive     AMPICILLIN <=0.25 SENSITIVE Sensitive     ERYTHROMYCIN 0.5 INTERMEDIATE Intermediate     VANCOMYCIN 0.5 SENSITIVE Sensitive     CEFTRIAXONE <=0.12 SENSITIVE Sensitive     LEVOFLOXACIN 0.5 SENSITIVE Sensitive     PENICILLIN Value in next row Sensitive      SENSITIVE0.12    * GROUP B STREP(S.AGALACTIAE)ISOLATED  Blood Culture ID Panel (Reflexed)     Status: Abnormal   Collection Time: 01/10/22  5:20 PM  Result Value Ref Range  Status   Enterococcus faecalis NOT DETECTED NOT DETECTED Final   Enterococcus Faecium NOT DETECTED NOT DETECTED Final   Listeria monocytogenes NOT DETECTED NOT DETECTED Final   Staphylococcus species NOT DETECTED NOT DETECTED Final  Staphylococcus aureus (BCID) NOT DETECTED NOT DETECTED Final   Staphylococcus epidermidis NOT DETECTED NOT DETECTED Final   Staphylococcus lugdunensis NOT DETECTED NOT DETECTED Final   Streptococcus species DETECTED (A) NOT DETECTED Final    Comment: CRITICAL RESULT CALLED TO, READ BACK BY AND VERIFIED WITH: G. COFFEE PHARMD, AT 1029 [id number] [address]    Streptococcus agalactiae DETECTED (A) NOT DETECTED Final    Comment: CRITICAL RESULT CALLED TO, READ BACK BY AND VERIFIED WITH: G. COFFEE PHARMD, AT [id number] [address] VANHOOK    Streptococcus pneumoniae NOT DETECTED NOT DETECTED Final   Streptococcus pyogenes NOT DETECTED NOT DETECTED Final   A.calcoaceticus-baumannii NOT DETECTED NOT DETECTED Final   Bacteroides fragilis NOT DETECTED NOT DETECTED Final   Enterobacterales NOT DETECTED NOT DETECTED Final   Enterobacter cloacae complex NOT DETECTED NOT DETECTED Final   Escherichia coli NOT DETECTED NOT DETECTED Final   Klebsiella aerogenes NOT DETECTED NOT DETECTED Final   Klebsiella oxytoca NOT DETECTED NOT DETECTED Final   Klebsiella pneumoniae NOT DETECTED NOT DETECTED Final   Proteus species NOT DETECTED NOT DETECTED Final   Salmonella species NOT DETECTED NOT DETECTED Final   Serratia marcescens NOT DETECTED NOT DETECTED Final   Haemophilus influenzae NOT DETECTED NOT DETECTED Final   Neisseria meningitidis NOT DETECTED NOT DETECTED Final   Pseudomonas aeruginosa NOT DETECTED NOT DETECTED Final   Stenotrophomonas maltophilia NOT DETECTED NOT DETECTED Final   Candida albicans NOT DETECTED NOT DETECTED Final   Candida auris NOT DETECTED NOT DETECTED Final   Candida glabrata NOT DETECTED NOT DETECTED Final   Candida krusei NOT DETECTED NOT DETECTED Final    Candida parapsilosis NOT DETECTED NOT DETECTED Final   Candida tropicalis NOT DETECTED NOT DETECTED Final   Cryptococcus neoformans/gattii NOT DETECTED NOT DETECTED Final    Comment: Performed at [facility] [facility], [address], [place], [place] [address]  Urine Culture     Status: Abnormal   Collection Time: 01/10/22  5:48 PM   Specimen: Urine, Clean Catch  Result Value Ref Range Status   Specimen Description   Final    URINE, CLEAN CATCH Performed at [facility], [address] [place], [place] [address]    Special Requests   Final    NONE Performed at [facility], [address] [place],  [address]    Culture (A)  Final    >=100,000 COLONIES/mL ESCHERICHIA COLI Confirmed Extended Spectrum Beta-Lactamase Producer (ESBL).  In bloodstream infections from ESBL organisms, carbapenems are preferred over piperacillin/tazobactam. They are shown to have a lower risk of mortality.    Report Status 01/13/2022 FINAL  Final   Organism ID, Bacteria ESCHERICHIA COLI (A)  Final      Susceptibility   Escherichia coli - MIC*    AMPICILLIN >=32 RESISTANT Resistant     CEFAZOLIN >=64 RESISTANT Resistant     CEFEPIME >=32 RESISTANT Resistant     CEFTRIAXONE >=64 RESISTANT Resistant     CIPROFLOXACIN >=4 RESISTANT Resistant     GENTAMICIN >=16 RESISTANT Resistant     IMIPENEM <=0.25 SENSITIVE Sensitive     NITROFURANTOIN 128 RESISTANT Resistant     TRIMETH/SULFA >=320 RESISTANT Resistant     AMPICILLIN/SULBACTAM >=32 RESISTANT Resistant     PIP/TAZO 16 SENSITIVE Sensitive     * >=100,000 COLONIES/mL ESCHERICHIA COLI  Culture, blood (x 2)     Status: None   Collection Time: 01/11/22  7:48 AM   Specimen: BLOOD  Result Value Ref Range Status   Specimen  Description   Final    BLOOD BLOOD RIGHT HAND Performed at [facility], [place] [address], [place], [place] [address]    Special Requests   Final    BOTTLES DRAWN AEROBIC AND ANAEROBIC Blood Culture results  may not be optimal due to an inadequate volume of blood received in culture bottles Performed at [facility], [address] [place], [place] [address]    Culture   Final    NO GROWTH 5 DAYS Performed at [facility], [address] [place], [place] [address]    Report Status 01/16/2022 FINAL  Final  Culture, blood (x 2)     Status: None   Collection Time: 01/11/22  7:58 AM   Specimen: BLOOD  Result Value Ref Range Status   Specimen Description   Final    BLOOD BLOOD RIGHT HAND Performed at [facility], [place] [address], [place], [place] [address]    Special Requests   Final    BOTTLES DRAWN AEROBIC AND ANAEROBIC Blood Culture results may not be optimal due to an inadequate volume of blood received in culture bottles Performed at [facility], [address] [place], [place] [address]    Culture   Final    NO GROWTH 5 DAYS Performed at [facility], [address] [place], [place] [address]    Report Status 01/16/2022 FINAL  Final  MRSA Next Gen by PCR, Nasal     Status: Abnormal   Collection Time: 01/11/22 11:02 AM   Specimen: Nasal Mucosa; Nasal Swab  Result Value Ref Range Status   MRSA by PCR Next Gen DETECTED (A) NOT DETECTED Final    Comment: RESULT CALLED TO, READ BACK BY AND VERIFIED WITH: DANNY GREBER @ 3149 ON 01/11/22 C VARNER (NOTE) The GeneXpert MRSA Assay (FDA approved for NASAL specimens only), is one component of a comprehensive MRSA colonization surveillance program. It is not intended to diagnose MRSA infection nor to guide or monitor treatment for MRSA infections. Test performance is not FDA approved in patients less than 61 years old. Performed at [facility], [address] [place], [place] [address]   Culture, blood (Routine X 2) w Reflex to ID Panel     Status: None   Collection Time: 01/12/22  1:53 AM   Specimen: BLOOD RIGHT FOREARM  Result Value Ref Range Status   Specimen  Description BLOOD RIGHT FOREARM  Final   Special Requests   Final    BOTTLES DRAWN AEROBIC ONLY Blood Culture adequate volume   Culture   Final    NO GROWTH 5 DAYS Performed at [facility], [address] [place], [place] [address]    Report Status 01/17/2022 FINAL  Final  Culture, blood (Routine X 2) w Reflex to ID Panel     Status: None   Collection Time: 01/12/22  1:53 AM   Specimen: Right Antecubital; Blood  Result Value Ref Range Status   Specimen Description RIGHT ANTECUBITAL  Final   Special Requests   Final    BOTTLES DRAWN AEROBIC AND ANAEROBIC Blood Culture results may not be optimal due to an excessive volume of blood received in culture bottles   Culture   Final    NO GROWTH 5 DAYS Performed at [facility], [address], [place], [place] [address]    Report Status 01/17/2022 FINAL  Final     Scheduled Meds:  apixaban  5 mg Oral BID  Chlorhexidine Gluconate Cloth  6 each Topical Q0600   darbepoetin (ARANESP) injection - NON-DIALYSIS  100 mcg Subcutaneous Q Thu-1800   ferrous sulfate  325 mg Oral Daily   insulin aspart  0-6 Units Subcutaneous TID WC   insulin detemir  5 Units Subcutaneous Daily   metoprolol succinate  50 mg Oral Daily   midodrine  20 mg Oral TID WC   mupirocin ointment   Nasal BID   omega-3 acid ethyl esters  1 g Oral Daily   pantoprazole  40 mg Oral Daily   rosuvastatin  5 mg Oral QPM   senna  2 tablet Oral BID   sevelamer carbonate  800 mg Oral BID WC   sodium zirconium cyclosilicate  10 g Oral Daily   Continuous Infusions:  sodium chloride Stopped (01/11/22 0915)   albumin human 60 mL/hr at 01/17/22 1708   albuterol      ceFAZolin (ANCEF) IV Stopped (01/17/22 1234)    Procedures/Studies: DG CHEST PORT 1 VIEW  Result Date: 01/16/2022 CLINICAL DATA:  Short of breath EXAM: PORTABLE CHEST 1 VIEW COMPARISON:  01/10/2022 FINDINGS: Normal cardiac silhouette. New opacity in the RIGHT lower lobe with obscuration of the RIGHT hemidiaphragm and  RIGHT heart border. LEFT lung clear. No pneumothorax. IMPRESSION: New RIGHT effusion with atelectasis or infiltrate. Electronically Signed   By: Suzy Bouchard M.D.   On: 01/16/2022 18:17   US ARTERIAL ABI (SCREENING LOWER EXTREMITY)  Result Date: 01/16/2022 CLINICAL DATA:  Diabetes, bilateral ankle osteomyelitis EXAM: NONINVASIVE PHYSIOLOGIC VASCULAR STUDY OF BILATERAL LOWER EXTREMITIES TECHNIQUE: Evaluation of both lower extremities were performed at rest, including calculation of ankle-brachial indices with single level Doppler, pressure and pulse volume recording. COMPARISON:  03/03/2019 FINDINGS: Right ABI:  1.49 (previously 1.45) Left ABI:  1.63 (previously 1.43) Right Lower Extremity:  Multiphasic arterial waveforms at the ankle. Left Lower Extremity: Normal arterial waveform in the dorsalis pedis, monophasic in the distal posterior tibial. IMPRESSION: Non diagnostic secondary to incompressible vessel calcifications (medial arterial sclerosis of [place]). Electronically Signed   By: Lucrezia Europe M.D.   On: 01/16/2022 15:37   ECHOCARDIOGRAM LIMITED  Result Date: 01/16/2022    ECHOCARDIOGRAM LIMITED REPORT   Patient Name:   MARKEIS ALLMAN Date of Exam: 01/16/2022 Medical Rec #:  [id number]     Height:       73.0 in Accession #:    [id number]    Weight:       263.2 lb Date of Birth:  1955/12/16      BSA:          2.418 m Patient Age:    74 years      BP:           115/64 mmHg Patient Gender: M             HR:           80 bpm. Exam Location:  [name] Procedure: Limited Echo Indications:    Eval LVEF  History:        Patient has prior history of Echocardiogram examinations.                 Arrythmias:Atrial Fibrillation; Risk Factors:Hypertension and                 Diabetes. ESRD on dialysis [facility]).  Sonographer:    Alvino Chapel RCS Referring Phys: [place]  1. Limited study.  2. Left ventricular ejection fraction, by estimation, is 55 to  60%. The left ventricle has normal  function. The left ventricle has no regional wall motion abnormalities. There is mild left ventricular hypertrophy. There is the interventricular septum is  flattened in systole and diastole, consistent with right ventricular pressure and volume overload.  3. Right ventricular systolic function is moderately reduced. The right ventricular size is moderately enlarged.  4. The mitral valve is abnormal.  5. The inferior vena cava is dilated in size with <50% respiratory variability, suggesting right atrial pressure of 15 mmHg. Comparison(s): Prior images reviewed side by side. LVEF remains normal. Moderate RV dysfunction, RVSP was not calculated. FINDINGS  Left Ventricle: Left ventricular ejection fraction, by estimation, is 55 to 60%. The left ventricle has normal function. The left ventricle has no regional wall motion abnormalities. The left ventricular internal cavity size was normal in size. There is  mild left ventricular hypertrophy. The interventricular septum is flattened in systole and diastole, consistent with right ventricular pressure and volume overload. Right Ventricle: The right ventricular size is moderately enlarged. No increase in right ventricular wall thickness. Right ventricular systolic function is moderately reduced. Pericardium: There is no evidence of pericardial effusion. Mitral Valve: The mitral valve is abnormal. There is mild thickening of the mitral valve leaflet(s). There is mild calcification of the mitral valve leaflet(s). Aorta: The aortic root is normal in size and structure. Venous: The inferior vena cava is dilated in size with less than 50% respiratory variability, suggesting right atrial pressure of 15 mmHg. LEFT VENTRICLE PLAX 2D LVIDd:         5.10 cm LVIDs:         3.30 cm LV PW:         1.20 cm LV IVS:        1.30 cm LVOT diam:     2.00 cm LVOT Area:     3.14 cm  LEFT ATRIUM         Index LA diam:    3.80 cm 1.57 cm/m   AORTA Ao Root diam: 3.80 cm  SHUNTS Systemic Diam:  2.00 cm Rozann Lesches MD Electronically signed by Rozann Lesches MD Signature Date/Time: 01/16/2022/1:41:10 PM    Final    MR ANKLE LEFT WO CONTRAST  Result Date: 01/13/2022 CLINICAL DATA:  Possible osteomyelitis. EXAM: MRI OF THE LEFT ANKLE WITHOUT CONTRAST TECHNIQUE: Multiplanar, multisequence MR imaging of the ankle was performed. No intravenous contrast was administered. COMPARISON:  Radiographs dated January 13, 2022 FINDINGS: TENDONS Peroneal: Peroneal longus tendon intact. Peroneal brevis intact. Posteromedial: Posterior tibial tendon intact. Flexor hallucis longus tendon intact. Flexor digitorum longus tendon intact. Anterior: Tibialis anterior tendon intact. Extensor hallucis longus tendon intact Extensor digitorum longus tendon intact. Achilles:  Intact. Plantar Fascia: Intact. LIGAMENTS Lateral: Anterior talofibular ligament intact. Calcaneofibular ligament intact. Posterior talofibular ligament intact. Anterior and posterior tibiofibular ligaments intact. Medial: Deltoid ligament intact. Spring ligament intact. CARTILAGE Ankle Joint: Small joint effusion. Normal ankle mortise. Subchondral cystic changes of the lateral aspect of the talar dome. Mild tibiotalar osteoarthritis. Subtalar Joints/Sinus Tarsi: Normal subtalar joints. No subtalar joint effusion. Normal sinus tarsi. Mild talonavicular osteoarthritis. Bones: Mild edema of the lateral aspect of the lateral malleolus, in the area of concern which in the presence of adjacent skin wound is concerning for osteomyelitis. No fracture or dislocation. Soft Tissue: Mild skin thickening and subcutaneous soft tissue edema. No fluid collection or abscess. No fluid collection or hematoma. Muscles are normal without edema or atrophy. Tarsal tunnel is normal. IMPRESSION: 1. Bone marrow edema of  the lateral malleolus, which in the presence of adjacent skin wound is concerning for osteomyelitis. 2. Mild tibiotalar osteoarthritis with focal subchondral cystic  changes of the lateral aspect of the talar dome. Small joint effusion. 3.  Mild talonavicular osteoarthritis. 4.  Ligaments and tendons appear maintained. Electronically Signed   By: Keane Police D.O.   On: 01/13/2022 17:12   MR ANKLE RIGHT WO CONTRAST  Result Date: 01/13/2022 CLINICAL DATA:  Possible osteomyelitis. EXAM: MRI OF THE RIGHT ANKLE WITHOUT CONTRAST TECHNIQUE: Multiplanar, multisequence MR imaging of the ankle was performed. No intravenous contrast was administered. COMPARISON:  Radiographs dated January 13, 2022 FINDINGS: TENDONS Peroneal: Peroneal longus tendon intact. Peroneal brevis intact. Posteromedial: Posterior tibial tendon intact with mild thickening suggesting tendinosis. Flexor hallucis longus tendon intact. Flexor digitorum longus tendon intact. Anterior: Tibialis anterior tendon intact. Extensor hallucis longus tendon intact Extensor digitorum longus tendon intact. Achilles:  Intact. Plantar Fascia: Intact. LIGAMENTS Lateral: Anterior talofibular ligament intact. Calcaneofibular ligament intact. Posterior talofibular ligament intact. Anterior and posterior tibiofibular ligaments intact. Medial: Deltoid ligament intact. Spring ligament intact. CARTILAGE Ankle Joint: Small joint effusion. Complete loss of the articular cartilage with subchondral cystic changes of the talus and the tibial plafond. Prominent osteophytes about the ankle joint consistent with advanced osteoarthritis. Subtalar Joints/Sinus Tarsi: Normal subtalar joints. No subtalar joint effusion. Normal sinus tarsi. Moderate osteoarthritis of the talonavicular joint with subchondral edema and marginal osteophytes. Bones: Susceptibility artifacts from the surgical hardware in the calcaneus and the fifth metatarsal limits evaluation. Bone marrow edema of the lateral malleolus. Soft Tissue: Mild soft tissue edema and skin thickening about the ankle. No fluid collection. IMPRESSION: 1. Bone marrow edema of the lateral malleolus,  which is likely reactive secondary to advanced osteoarthritis and altered mechanics. If there is adjacent skin wound osteomyelitis can not be excluded. 2. Advanced osteoarthritis of the tibiotalar joint, with complete loss of the articular cartilage, subchondral cystic changes and prominent osteophytes. 3.  Moderate osteoarthritis of the talonavicular joint. 4. Tendons of the flexor, extensor and peroneal compartments appear maintained. 5.  No acute ligamentous injury. Electronically Signed   By: Keane Police D.O.   On: 01/13/2022 16:57   DG Ankle Left Port  Result Date: 01/13/2022 CLINICAL DATA:  Osteomyelitis EXAM: PORTABLE LEFT ANKLE - 2 VIEW COMPARISON:  None Available. FINDINGS: Osteopenia. No fracture or dislocation of the left ankle. Mild ankle mortise and midfoot arthrosis. Extensive vascular calcinosis. IMPRESSION: Osteopenia. No fracture or dislocation of the left ankle. Mild ankle mortise and midfoot arthrosis. No radiographic evidence of osteomyelitis. MRI is the test of choice to evaluate for osteomyelitis and bone marrow edema if suspected. Electronically Signed   By: Delanna Ahmadi M.D.   On: 01/13/2022 12:45   DG Ankle Right Port  Result Date: 01/13/2022 CLINICAL DATA:  Soft tissue wounds. EXAM: PORTABLE RIGHT ANKLE - 2 VIEW COMPARISON:  None Available. FINDINGS: Surgical staples are noted in the calcaneus. Status post surgical fixation of proximal fifth metatarsal. There degenerative change of the talotibial joint is noted. Vascular calcifications are noted. Extensive osteophyte formation is noted. Cortical irregularity may be involving the lateral portion of the talus; osteomyelitis cannot be excluded. IMPRESSION: Cortical irregularity is seen involving the lateral portion of the talus; osteomyelitis cannot be excluded. Extensive degenerative changes are noted involving the talotibial joint as well as postsurgical changes involving the calcaneus. Electronically Signed   By: Marijo Conception  M.D.   On: 01/13/2022 12:43   US Venous Img Lower Bilateral (DVT)  Result Date: 01/11/2022 CLINICAL DATA:  Bilateral lower extremity edema and pain. EXAM: BILATERAL LOWER EXTREMITY VENOUS DOPPLER ULTRASOUND TECHNIQUE: Gray-scale sonography with graded compression, as well as color Doppler and duplex ultrasound were performed to evaluate the lower extremity deep venous systems from the level of the common femoral vein and including the common femoral, femoral, profunda femoral, popliteal and calf veins including the posterior tibial, peroneal and gastrocnemius veins when visible. The superficial great saphenous vein was also interrogated. Spectral Doppler was utilized to evaluate flow at rest and with distal augmentation maneuvers in the common femoral, femoral and popliteal veins. COMPARISON:  None Available. FINDINGS: RIGHT LOWER EXTREMITY Common Femoral Vein: No evidence of thrombus. Normal compressibility, respiratory phasicity and response to augmentation. Saphenofemoral Junction: No evidence of thrombus. Normal compressibility and flow on color Doppler imaging. Profunda Femoral Vein: No evidence of thrombus. Normal compressibility and flow on color Doppler imaging. Femoral Vein: No evidence of thrombus. Normal compressibility, respiratory phasicity and response to augmentation. Popliteal Vein: No evidence of thrombus. Normal compressibility, respiratory phasicity and response to augmentation. Calf Veins: No evidence of thrombus. Normal compressibility and flow on color Doppler imaging. LEFT LOWER EXTREMITY Common Femoral Vein: No evidence of thrombus. Normal compressibility, respiratory phasicity and response to augmentation. Saphenofemoral Junction: No evidence of thrombus. Normal compressibility and flow on color Doppler imaging. Profunda Femoral Vein: No evidence of thrombus. Normal compressibility and flow on color Doppler imaging. Femoral Vein: No evidence of thrombus. Normal compressibility,  respiratory phasicity and response to augmentation. Popliteal Vein: No evidence of thrombus. Normal compressibility, respiratory phasicity and response to augmentation. Calf Veins: No evidence of thrombus. Normal compressibility and flow on color Doppler imaging. IMPRESSION: No evidence of deep venous thrombosis in either lower extremity. Electronically Signed   By: Margaretha Sheffield M.D.   On: 01/11/2022 09:17   DG Chest Port 1 View  Result Date: 01/10/2022 CLINICAL DATA:  Questionable sepsis, Pt is a MWF dialysis pt who went today and reportedly did not receive dialysis, or did not have any fluid off (conflicting stories). Pt has been increasingly weak and today staff noticed him to be febrile. EXAM: PORTABLE CHEST - 1 VIEW COMPARISON:  12/16/2021 FINDINGS: Lungs are clear. Heart size and mediastinal contours are within normal limits. Blunting of lateral costophrenic angles suggesting small effusions. Visualized bones unremarkable. IMPRESSION: Possible small pleural effusions.  No overt interstitial edema. Electronically Signed   By: Lucrezia Europe M.D.   On: 01/10/2022 17:33    Orson Eva, DO  [facility]  If 7PM-7AM, please contact night-coverage [email or web address] Password [facility] 01/18/2022, 12:55 PM   LOS: 8 days

## 2022-01-18 NOTE — Progress Notes (Signed)
Patient had medium size mushy bloody stool this afternoon. Dr Tat made aware. Hemoccult ordered, collected and sent to lab. Result came back positive. Dr Tat made aware.

## 2022-01-18 NOTE — Progress Notes (Signed)
Palliative:  HPI: 66 y.o. male  with past medical history of ESRD on HD, HTN, afib on Eliquis, HFpEF, anemia of chronic illness, diabetes, foot ulcer (concern for early osteomyelitis on past admission), gout, urinary retention admitted from [name] facility on 01/10/2022 with sepsis potentially due to right leg cellulitis.   I met today again with Danny [name]. I met him initially last week and he elected DNR status. Danny [name] had talked about going to heaven and being at peace even then. Gentle has continued to decline and struggling with dialysis and fluid removal with complications of atrial fibrillation as well. He appears much worse than when I initially saw him the morning after he was admitted. He is congested with cough and is very weak and deconditioned. Danny [name] tells me that he has been trying to figure out "how to get to heaven." He is very strong in his faith and at peace with where he goes when he dies. However, Danny [name] than tells me that he knows he is sick but he doesn't really want to die yet but that he does want to go to heaven so he doesn't know what to do. He jokes that he knows dying is the only way to get to heaven. I speak with Danny [name] about different paths of care of continuing with aggressive measures vs focus on comfort care. I explained that some people do decide at some stage that they do not want to go through more dialysis, more blood sticks, and interventions if it is not helping them to ultimately feel and get better. He understands. He is already talking about tired of being stuck for blood. He asks about what he should do. He does not want to suffer but he is also afraid to die. I reassured him that if he is not improving and our goal is for comfort we would help to ensure that he does not suffer if we know his time is limited. I reassured him that we will take care of him. He wishes to try dialysis again today but we discussed if this does not go well we will get together with his  sister, Danny [name], to discuss potential transition to comfort care. At this time he wishes to continue current care including BiPAP as needed. He gives me permission to call and share our conversation with [place].   I called and spoke with sister, Danny [name]. I explained concerns with poor tolerance of dialysis and worsening over course of hospitalization. He has chronic ulcers with chronic osteomyelitis. Danny [name] shares that she spoke with a doctor that explained he is a poor candidate for amputation and that they should anticipate palliative care with transition to hospice. I explained to [place] my conversation with [name] this morning. Danny [name] is tearful but has good awareness of her brother's declining health and poor prognosis. She does not want him to suffer. We make a plan to meet together tomorrow 01/19/22 1000 am to further discuss goals of care. She wants to be present to support Danny [name]. We will see how today goes but we both agree that dialysis is unlikely to go well.   All questions/concerns addressed. Emotional support. Updated Dr. Carles Collet and Dr. Hollie Salk. Discussed with RN Anderson Malta and chaplain [place].   Exam: Alert. Oriented. Generalized weakness and fatigue. Congestion and cough. Abd soft.   Plan: - DNR previously decided - Continue current level of care - Considering transition to comfort and hospice - Continue spiritual care support - Revisiting goals of care with family support  present tomorrow 7/27 2426 am  50 min  Vinie Sill, NP Palliative Medicine Team Pager [phone] (Please see [email or web address] for schedule) Team Phone [phone]    Greater than 50%  of this time was spent counseling and coordinating care related to the above assessment and plan

## 2022-01-18 NOTE — Procedures (Signed)
   HEMODIALYSIS TREATMENT NOTE:   Extra session today.  Attempted 2.5 liters over 4 hours.  Pre-HD 133/60, NSR 80s.  About 75 m into treatment HR^ 140s.  Goal and BFR were lowered and he converted back to NSR. Hemodynamically stable thereafter.  Pt asked to end session 7 minutes early.  Goal met: 2 liters removed.  All blood was returned.  No changes from pre-HD assessment.  Report given to Alycia Patten, RN.   Rockwell Alexandria, RN

## 2022-01-18 NOTE — Progress Notes (Signed)
Virtual Visit via Video Note  I connected with Danny [name] on 01/18/2022 at  by a video enabled telemedicine application and verified that I am speaking with the correct person using two identifiers.  Location: Patient: Danny [name] [id number] Provider: Home   I discussed the limitations of evaluation and management by telemedicine and the availability of in person appointments. The patient expressed understanding and agreed to proceed.       Subjective:  He was chatting with the chaplain and saying he is contemplating "going to heaven"   Antibiotics:  Anti-infectives (From admission, onward)    Start     Dose/Rate Route Frequency Ordered Stop   01/16/22 1600  ceFAZolin (ANCEF) IVPB 2g/100 mL premix        2 g 200 mL/hr over 30 Minutes Intravenous Every T-Th-Sa (Hemodialysis) 01/16/22 1441 03/08/22 2359   01/12/22 1600  vancomycin (VANCOCIN) IVPB 1000 mg/200 mL premix  Status:  Discontinued        1,000 mg 200 mL/hr over 60 Minutes Intravenous Every T-Th-Sa (Hemodialysis) 01/11/22 1300 01/12/22 1012   01/12/22 1600  penicillin G potassium 2 Million Units in dextrose 5 % 50 mL IVPB  Status:  Discontinued        2 Million Units 100 mL/hr over 30 Minutes Intravenous Every 4 hours 01/12/22 1013 01/16/22 1435   01/12/22 1200  penicillin G potassium 4 Million Units in dextrose 5 % 250 mL IVPB        4 Million Units 250 mL/hr over 60 Minutes Intravenous  Once 01/12/22 1012 01/12/22 1304   01/12/22 1130  ciprofloxacin (CIPRO) tablet 500 mg  Status:  Discontinued       Note to Pharmacy: UTI - E-coli   500 mg Oral Every 24 hours 01/12/22 1118 01/13/22 1112   01/11/22 2200  ceFEPIme (MAXIPIME) 1 g in sodium chloride 0.9 % 100 mL IVPB  Status:  Discontinued        1 g 200 mL/hr over 30 Minutes Intravenous Every 24 hours 01/10/22 1815 01/12/22 1012   01/11/22 0715  metroNIDAZOLE (FLAGYL) IVPB 500 mg  Status:  Discontinued        500 mg 100 mL/hr over 60 Minutes Intravenous Every 12 hours  01/11/22 0714 01/12/22 1012   01/10/22 1730  ceFEPIme (MAXIPIME) 2 g in sodium chloride 0.9 % 100 mL IVPB        2 g 200 mL/hr over 30 Minutes Intravenous  Once 01/10/22 1719 01/10/22 1804   01/10/22 1730  vancomycin (VANCOREADY) IVPB 2000 mg/400 mL        2,000 mg 200 mL/hr over 120 Minutes Intravenous  Once 01/10/22 1720 01/10/22 2132   01/10/22 1715  aztreonam (AZACTAM) 2 g in sodium chloride 0.9 % 100 mL IVPB  Status:  Discontinued        2 g 200 mL/hr over 30 Minutes Intravenous  Once 01/10/22 1713 01/10/22 1719   01/10/22 1715  metroNIDAZOLE (FLAGYL) IVPB 500 mg        500 mg 100 mL/hr over 60 Minutes Intravenous  Once 01/10/22 1713 01/10/22 1912   01/10/22 1715  vancomycin (VANCOCIN) IVPB 1000 mg/200 mL premix  Status:  Discontinued        1,000 mg 200 mL/hr over 60 Minutes Intravenous  Once 01/10/22 1713 01/10/22 1720       Medications: Scheduled Meds:  apixaban  5 mg Oral BID   Chlorhexidine Gluconate Cloth  6 each Topical Q0600   darbepoetin (ARANESP) injection - NON-DIALYSIS  100 mcg Subcutaneous Q Thu-1800   ferrous sulfate  325 mg Oral Daily   insulin aspart  0-6 Units Subcutaneous TID WC   insulin detemir  5 Units Subcutaneous Daily   metoprolol succinate  50 mg Oral Daily   midodrine  20 mg Oral TID WC   mupirocin ointment   Nasal BID   omega-3 acid ethyl esters  1 g Oral Daily   pantoprazole  40 mg Oral Daily   rosuvastatin  5 mg Oral QPM   senna  2 tablet Oral BID   sevelamer carbonate  800 mg Oral BID WC   sodium zirconium cyclosilicate  10 g Oral Daily   Continuous Infusions:  sodium chloride Stopped (01/11/22 0915)   albumin human 60 mL/hr at 01/17/22 1708   albuterol      ceFAZolin (ANCEF) IV Stopped (01/17/22 1234)   PRN Meds:.acetaminophen **OR** acetaminophen, albumin human, ALPRAZolam, benzonatate, levalbuterol, metoprolol tartrate, ondansetron **OR** ondansetron (ZOFRAN) IV, pentafluoroprop-tetrafluoroeth, sodium chloride    Objective: Weight  change:   Intake/Output Summary (Last 24 hours) at 01/18/2022 1404 Last data filed at 01/17/2022 2300 Gross per 24 hour  Intake 253.56 ml  Output --  Net 253.56 ml    Blood pressure 107/73, pulse 78, temperature 97.6 F (36.4 C), temperature source Oral, resp. rate 20, height '6\' 1"'$  (1.854 m), weight 119.4 kg, SpO2 98 %. Temp:  [95.7 F (35.4 C)-97.8 F (36.6 C)] 97.6 F (36.4 C) (07/26 0731) Pulse Rate:  [70-90] 78 (07/26 1400) Resp:  [13-29] 20 (07/26 1400) BP: (95-151)/(40-107) 107/73 (07/26 1400) SpO2:  [88 %-100 %] 98 % (07/26 1400) FiO2 (%):  [40 %] 40 % (07/25 1648)  Physical Exam: Physical Exam Constitutional:      Appearance: He is well-developed. He is ill-appearing and diaphoretic.  HENT:     Head: Normocephalic and atraumatic.  Eyes:     Conjunctiva/sclera: Conjunctivae normal.  Cardiovascular:     Rate and Rhythm: Tachycardia present. Rhythm irregular.  Pulmonary:     Effort: Pulmonary effort is normal. No respiratory distress.     Breath sounds: Normal breath sounds. No stridor. No wheezing.  Abdominal:     General: There is no distension.     Palpations: Abdomen is soft.  Musculoskeletal:        General: Normal range of motion.     Cervical back: Normal range of motion and neck supple.  Skin:    General: Skin is warm.     Findings: No erythema or rash.  Neurological:     General: No focal deficit present.     Mental Status: He is alert.  Psychiatric:        Attention and Perception: He is inattentive.        Mood and Affect: Mood normal.        Speech: Speech is delayed.        Behavior: Behavior normal.        Thought Content: Thought content normal.        Judgment: Judgment normal.     Erythema in right lower extremity is stable  CBC:    BMET Recent Labs    01/17/22 1728 01/18/22 0431  NA 128* 129*  K 4.5 5.6*  CL 91* 92*  CO2 25 24  GLUCOSE 137* 118*  BUN 36* 41*  CREATININE 3.06* 3.60*  CALCIUM 7.7* 7.8*      Liver  Panel  Recent Labs    01/17/22 1728  PROT 6.9  ALBUMIN 2.1*  AST 16  ALT 17  ALKPHOS 188*  BILITOT 2.0*        Sedimentation Rate No results for input(s): "ESRSEDRATE" in the last 72 hours.  C-Reactive Protein No results for input(s): "CRP" in the last 72 hours.   Micro Results: Recent Results (from the past 720 hour(s))  Resp Panel by RT-PCR (Flu A&B, Covid) Anterior Nasal Swab     Status: None   Collection Time: 01/10/22  5:20 PM   Specimen: Anterior Nasal Swab  Result Value Ref Range Status   SARS Coronavirus 2 by RT PCR NEGATIVE NEGATIVE Final    Comment: (NOTE) SARS-CoV-2 target nucleic acids are NOT DETECTED.  The SARS-CoV-2 RNA is generally detectable in upper respiratory specimens during the acute phase of infection. The lowest concentration of SARS-CoV-2 viral copies this assay can detect is 138 copies/mL. A negative result does not preclude SARS-Cov-2 infection and should not be used as the sole basis for treatment or other patient management decisions. A negative result may occur with  improper specimen collection/handling, submission of specimen other than nasopharyngeal swab, presence of viral mutation(s) within the areas targeted by this assay, and inadequate number of viral copies(<138 copies/mL). A negative result must be combined with clinical observations, patient history, and epidemiological information. The expected result is Negative.  Fact Sheet for Patients:  EntrepreneurPulse.com.au  Fact Sheet for Healthcare Providers:  IncredibleEmployment.be  This test is no t yet approved or cleared by the [place] FDA and  has been authorized for detection and/or diagnosis of SARS-CoV-2 by FDA under an Emergency Use Authorization (EUA). This EUA will remain  in effect (meaning this test can be used) for the duration of the COVID-19 declaration under Section 564(b)(1) of the Act, 21 U.S.C.section  360bbb-3(b)(1), unless the authorization is terminated  or revoked sooner.       Influenza A by PCR NEGATIVE NEGATIVE Final   Influenza B by PCR NEGATIVE NEGATIVE Final    Comment: (NOTE) The Xpert Xpress SARS-CoV-2/FLU/RSV plus assay is intended as an aid in the diagnosis of influenza from Nasopharyngeal swab specimens and should not be used as a sole basis for treatment. Nasal washings and aspirates are unacceptable for Xpert Xpress SARS-CoV-2/FLU/RSV testing.  Fact Sheet for Patients: EntrepreneurPulse.com.au  Fact Sheet for Healthcare Providers: IncredibleEmployment.be  This test is not yet approved or cleared by the [place] FDA and has been authorized for detection and/or diagnosis of SARS-CoV-2 by FDA under an Emergency Use Authorization (EUA). This EUA will remain in effect (meaning this test can be used) for the duration of the COVID-19 declaration under Section 564(b)(1) of the Act, 21 U.S.C. section 360bbb-3(b)(1), unless the authorization is terminated or revoked.  Performed at [facility], [address], [place], [place] [address]   Blood Culture (routine x 2)     Status: Abnormal   Collection Time: 01/10/22  5:20 PM   Specimen: BLOOD RIGHT FOREARM  Result Value Ref Range Status   Specimen Description   Final    BLOOD RIGHT FOREARM Performed at [facility], [address] [place], [place] [address]    Special Requests   Final    BOTTLES DRAWN AEROBIC AND ANAEROBIC Blood Culture results may not be optimal due to an inadequate volume of blood received in culture bottles Performed at [facility], [address], [place], [place] [address]    Culture  Setup Time   Final    GRAM POSITIVE COCCI BOTTLES DRAWN AEROBIC AND ANAEROBIC Gram Stain Report Called  to,Read Back By and Verified With: FERRAINLO,J'@0322'$  BT MATTHEWS,B 7.19.2023 Performed at [facility], [address], [place], [place] [address]    Culture (A)  Final     GROUP B STREP(S.AGALACTIAE)ISOLATED SUSCEPTIBILITIES PERFORMED ON PREVIOUS CULTURE WITHIN THE LAST 5 DAYS. Performed at [facility], [place] [address] [place], [place] [address]    Report Status 01/13/2022 FINAL  Final  Blood Culture (routine x 2)     Status: Abnormal   Collection Time: 01/10/22  5:20 PM   Specimen: BLOOD  Result Value Ref Range Status   Specimen Description   Final    BLOOD RIGHT ANTECUBITAL Performed at [facility], [place] [address] [place], [place] [address]    Special Requests   Final    BOTTLES DRAWN AEROBIC AND ANAEROBIC Blood Culture adequate volume Performed at [facility], [address] [place], [place] [address]    Culture  Setup Time   Final    GRAM POSITIVE COCCI BOTTLES DRAWN AEROBIC AND ANAEROBIC Gram Stain Report Called to,Read Back By and Verified With: FERRAINLO,J'@0322'$  BY MAATHEWS,B 7.19.2023 Organism ID to follow CRITICAL RESULT CALLED TO, READ BACK BY AND VERIFIED WITH: Chucky May PHARMD, AT [place] Performed at [facility], [place] [address], [place], [place] [address]    Culture GROUP B STREP(S.AGALACTIAE)ISOLATED (A)  Final   Report Status 01/13/2022 FINAL  Final   Organism ID, Bacteria GROUP B STREP(S.AGALACTIAE)ISOLATED  Final      Susceptibility   Group b strep(s.agalactiae)isolated - MIC*    CLINDAMYCIN <=0.25 SENSITIVE Sensitive     AMPICILLIN <=0.25 SENSITIVE Sensitive     ERYTHROMYCIN 0.5 INTERMEDIATE Intermediate     VANCOMYCIN 0.5 SENSITIVE Sensitive     CEFTRIAXONE <=0.12 SENSITIVE Sensitive     LEVOFLOXACIN 0.5 SENSITIVE Sensitive     PENICILLIN Value in next row Sensitive      SENSITIVE0.12    * GROUP B STREP(S.AGALACTIAE)ISOLATED  Blood Culture ID Panel (Reflexed)     Status: Abnormal   Collection Time: 01/10/22  5:20 PM  Result Value Ref Range Status   Enterococcus faecalis NOT DETECTED NOT DETECTED Final   Enterococcus Faecium NOT DETECTED NOT DETECTED Final   Listeria monocytogenes NOT  DETECTED NOT DETECTED Final   Staphylococcus species NOT DETECTED NOT DETECTED Final   Staphylococcus aureus (BCID) NOT DETECTED NOT DETECTED Final   Staphylococcus epidermidis NOT DETECTED NOT DETECTED Final   Staphylococcus lugdunensis NOT DETECTED NOT DETECTED Final   Streptococcus species DETECTED (A) NOT DETECTED Final    Comment: CRITICAL RESULT CALLED TO, READ BACK BY AND VERIFIED WITH: G. COFFEE PHARMD, AT [id number] D. VANHOOK    Streptococcus agalactiae DETECTED (A) NOT DETECTED Final    Comment: CRITICAL RESULT CALLED TO, READ BACK BY AND VERIFIED WITH: G. COFFEE PHARMD, AT [id number] [id number] D. VANHOOK    Streptococcus pneumoniae NOT DETECTED NOT DETECTED Final   Streptococcus pyogenes NOT DETECTED NOT DETECTED Final   A.calcoaceticus-baumannii NOT DETECTED NOT DETECTED Final   Bacteroides fragilis NOT DETECTED NOT DETECTED Final   Enterobacterales NOT DETECTED NOT DETECTED Final   Enterobacter cloacae complex NOT DETECTED NOT DETECTED Final   Escherichia coli NOT DETECTED NOT DETECTED Final   Klebsiella aerogenes NOT DETECTED NOT DETECTED Final   Klebsiella oxytoca NOT DETECTED NOT DETECTED Final   Klebsiella pneumoniae NOT DETECTED NOT DETECTED Final   Proteus species NOT DETECTED NOT DETECTED Final   Salmonella species NOT DETECTED NOT DETECTED Final   Serratia  marcescens NOT DETECTED NOT DETECTED Final   Haemophilus influenzae NOT DETECTED NOT DETECTED Final   Neisseria meningitidis NOT DETECTED NOT DETECTED Final   Pseudomonas aeruginosa NOT DETECTED NOT DETECTED Final   Stenotrophomonas maltophilia NOT DETECTED NOT DETECTED Final   Candida albicans NOT DETECTED NOT DETECTED Final   Candida auris NOT DETECTED NOT DETECTED Final   Candida glabrata NOT DETECTED NOT DETECTED Final   Candida krusei NOT DETECTED NOT DETECTED Final   Candida parapsilosis NOT DETECTED NOT DETECTED Final   Candida tropicalis NOT DETECTED NOT DETECTED Final   Cryptococcus neoformans/gattii NOT  DETECTED NOT DETECTED Final    Comment: Performed at [facility], [place] [address], [place], [place] [address]  Urine Culture     Status: Abnormal   Collection Time: 01/10/22  5:48 PM   Specimen: Urine, Clean Catch  Result Value Ref Range Status   Specimen Description   Final    URINE, CLEAN CATCH Performed at [facility], [address], [place], [place] [address]    Special Requests   Final    NONE Performed at [facility], [address], [place], [place] [address]    Culture (A)  Final    >=100,000 COLONIES/mL ESCHERICHIA COLI Confirmed Extended Spectrum Beta-Lactamase Producer (ESBL).  In bloodstream infections from ESBL organisms, carbapenems are preferred over piperacillin/tazobactam. They are shown to have a lower risk of mortality.    Report Status 01/13/2022 FINAL  Final   Organism ID, Bacteria ESCHERICHIA COLI (A)  Final      Susceptibility   Escherichia coli - MIC*    AMPICILLIN >=32 RESISTANT Resistant     CEFAZOLIN >=64 RESISTANT Resistant     CEFEPIME >=32 RESISTANT Resistant     CEFTRIAXONE >=64 RESISTANT Resistant     CIPROFLOXACIN >=4 RESISTANT Resistant     GENTAMICIN >=16 RESISTANT Resistant     IMIPENEM <=0.25 SENSITIVE Sensitive     NITROFURANTOIN 128 RESISTANT Resistant     TRIMETH/SULFA >=320 RESISTANT Resistant     AMPICILLIN/SULBACTAM >=32 RESISTANT Resistant     PIP/TAZO 16 SENSITIVE Sensitive     * >=100,000 COLONIES/mL ESCHERICHIA COLI  Culture, blood (x 2)     Status: None   Collection Time: 01/11/22  7:48 AM   Specimen: BLOOD  Result Value Ref Range Status   Specimen Description   Final    BLOOD BLOOD RIGHT HAND Performed at [facility], [address] [place], [place] [address]    Special Requests   Final    BOTTLES DRAWN AEROBIC AND ANAEROBIC Blood Culture results may not be optimal due to an inadequate volume of blood received in culture bottles Performed at [facility], [address] [place], [place] [address]    Culture   Final    NO GROWTH 5 DAYS Performed at [facility], [address] [place], [place] [address]    Report Status 01/16/2022 FINAL  Final  Culture, blood (x 2)     Status: None   Collection Time: 01/11/22  7:58 AM   Specimen: BLOOD  Result Value Ref Range Status   Specimen Description   Final    BLOOD BLOOD RIGHT HAND Performed at [facility], [place] [address] [place],  [address]    Special Requests   Final    BOTTLES DRAWN AEROBIC AND ANAEROBIC Blood Culture results may not be optimal due to an inadequate volume of blood received in culture bottles Performed  at [facility], [place] [address], [place], [place] [address]    Culture   Final    NO GROWTH 5 DAYS Performed at [facility], [address] [place], [place] [address]    Report Status 01/16/2022 FINAL  Final  MRSA Next Gen by PCR, Nasal     Status: Abnormal   Collection Time: 01/11/22 11:02 AM   Specimen: Nasal Mucosa; Nasal Swab  Result Value Ref Range Status   MRSA by PCR Next Gen DETECTED (A) NOT DETECTED Final    Comment: RESULT CALLED TO, READ BACK BY AND VERIFIED WITH: DANNY GREBER @ [id number] ON 01/11/22 C VARNER (NOTE) The GeneXpert MRSA Assay (FDA approved for NASAL specimens only), is one component of a comprehensive MRSA colonization surveillance program. It is not intended to diagnose MRSA infection nor to guide or monitor treatment for MRSA infections. Test performance is not FDA approved in patients less than 104 years old. Performed at [facility], [address] [place], [place] [address]   Culture, blood (Routine X 2) w Reflex to ID Panel     Status: None   Collection Time: 01/12/22  1:53 AM   Specimen: BLOOD RIGHT FOREARM  Result Value Ref Range Status   Specimen Description BLOOD RIGHT FOREARM  Final   Special Requests   Final    BOTTLES DRAWN AEROBIC ONLY Blood Culture adequate volume   Culture    Final    NO GROWTH 5 DAYS Performed at [facility], [address], [place], [place] [address]    Report Status 01/17/2022 FINAL  Final  Culture, blood (Routine X 2) w Reflex to ID Panel     Status: None   Collection Time: 01/12/22  1:53 AM   Specimen: Right Antecubital; Blood  Result Value Ref Range Status   Specimen Description RIGHT ANTECUBITAL  Final   Special Requests   Final    BOTTLES DRAWN AEROBIC AND ANAEROBIC Blood Culture results may not be optimal due to an excessive volume of blood received in culture bottles   Culture   Final    NO GROWTH 5 DAYS Performed at [facility], [address] [place], [name] [address]    Report Status 01/17/2022 FINAL  Final    Studies/Results: DG CHEST PORT 1 VIEW  Result Date: 01/16/2022 CLINICAL DATA:  Short of breath EXAM: PORTABLE CHEST 1 VIEW COMPARISON:  01/10/2022 FINDINGS: Normal cardiac silhouette. New opacity in the RIGHT lower lobe with obscuration of the RIGHT hemidiaphragm and RIGHT heart border. LEFT lung clear. No pneumothorax. IMPRESSION: New RIGHT effusion with atelectasis or infiltrate. Electronically Signed   By: Suzy Bouchard M.D.   On: 01/16/2022 18:17   US ARTERIAL ABI (SCREENING LOWER EXTREMITY)  Result Date: 01/16/2022 CLINICAL DATA:  Diabetes, bilateral ankle osteomyelitis EXAM: NONINVASIVE PHYSIOLOGIC VASCULAR STUDY OF BILATERAL LOWER EXTREMITIES TECHNIQUE: Evaluation of both lower extremities were performed at rest, including calculation of ankle-brachial indices with single level Doppler, pressure and pulse volume recording. COMPARISON:  03/03/2019 FINDINGS: Right ABI:  1.49 (previously 1.45) Left ABI:  1.63 (previously 1.43) Right Lower Extremity:  Multiphasic arterial waveforms at the ankle. Left Lower Extremity: Normal arterial waveform in the dorsalis pedis, monophasic in the distal posterior tibial. IMPRESSION: Non diagnostic secondary to incompressible vessel calcifications (medial arterial sclerosis of  [place]). Electronically Signed   By: Lucrezia Europe M.D.   On: 01/16/2022 15:37      Assessment/Plan:  INTERVAL HISTORY:  Patient has developed atrial fibrillation with RVR hypotension  during dialysis now return to the ICU.    Principal Problem:   Septic shock ([place]) Active Problems:   Cellulitis, leg   ESRD on dialysis ([place])   Acute metabolic encephalopathy   Iron deficiency anemia   Atrial fibrillation, chronic (HCC)   Type 2 diabetes mellitus (HCC)   Obesity (BMI 30-39.9)   Mixed hyperlipidemia   Hypotension   Open wound of right foot   Lactic acidosis   Acute encephalopathy   Acute hypoxemic respiratory failure (HCC)   Severe sepsis (HCC)   Streptococcal bacteremia   Chronic osteomyelitis, ankle (HCC)   History of revision of total knee arthroplasty   Hyponatremia    Danny [name] is a 66 y.o. male with history of end-stage renal disease on hemodialysis via AV fistula, left total knee arthroplasty bilateral ulceration of his ankles with osteomyelitis seen back in April 2023 now admitted with group B streptococcus bacteremia and septic shock requiring vasopressors.  #1  Group B streptococcus bacteremia due to cellulitis versus chronic osteomyelitis of ankles bilaterally  See prior extensive discussions.  Unfortunately his tendon taken in turn for the worse clinically.  I would continue his cefazolin with dialysis.  We can reevaluate how he is doing but at this moment in time as his prognosis is tenuous  #2 stage renal disease on hemodialysis was having trouble tolerating dialysis in terms of blood pressures and now back in the ICU   #3  Atrial fibrillation with RVR complicating his hypotension  #4 goals of care: My understanding is he is contemplating switching to palliative care if attempts to remove further fluid with dialysis are not successful.    I spent 36 minutes with the patient including than 50% of the time in face to face counseling of the patient  has severe infection atrial fibrillation rapid ventricular response is trouble tolerating dialysis his goals of carelong with review of medical records in preparation for the visit and during the visit and in coordination of his  care.    LOS: 8 days   Alcide Evener 01/18/2022, 2:04 PM

## 2022-01-18 NOTE — Progress Notes (Signed)
Subjective:   Seen in room.  Had HD yesterday- rx terminated d/t Afib with RVR and hypotension.  3 hr run time with 2.2L off.  Extra HD for today.  D/w RN- hold metop/Eliquis, give midodrine, increased dose to 20 mg TID from 10 BID.  Pt is SOB this AM on [place] O2.   Objective Vital signs in last 24 hours: Vitals:   01/18/22 0700 01/18/22 0731 01/18/22 0800 01/18/22 0830  BP: 95/64  (!) 101/40   Pulse: 79  80 81  Resp: 20  (!) 22   Temp:  97.6 F (36.4 C)    TempSrc:  Oral    SpO2: 99%  99%   Weight:      Height:       Weight change:   Intake/Output Summary (Last 24 hours) at 01/18/2022 0933 Last data filed at 01/17/2022 2300 Gross per 24 hour  Intake 733.56 ml  Output --  Net 733.56 ml   Dialysis Orders: Center: [facility]  on TTS . EDW 110 kg HD Bath 2K/2.5Ca  Time 4 hours Heparin none. Access LUE AVF BFR 400 DFR 500    Venofer  50 mg IV once a week     Assessment/ Plan: Pt is a 66 y.o. yo male with ESRD who was admitted on 01/10/2022 with fever, weakness-  sepsis syndrome    Assessment/Plan: 1. Fever/presumed sepsis-  felt due to leg wound. abx per primary team.   2. ESRD -  continue HD per TTS schedule.  Outpatient he is at [place].  HD terminated yesterday d/t hemodynamic compromise (Afib with RVR) and acute hypoxic RF.  Discussed with pt- will attempt HD again today.  If unsuccessful may need to transfer to [facility] for CRRT.  3. Anemia CKD -   On ESA - aranesp 100 mcg every Thursday    4. Secondary hyperparathyroidism- cont renvela-  is not on vit D or sensipar it seems   5. HTN/volume-  fluid volume overload, has hyponatremia.  UF as tolerated otday  6. Afib - Metop and eliquis  7.  Hyperkalemia: K 5.6, give lokelma today before HD, use 2K bath  8.  Dispo: ICU  Labs: Basic Metabolic Panel: Recent Labs  Lab 01/17/22 1024 01/17/22 1728 01/18/22 0431  NA 125* 128* 129*  K 6.5* 4.5 5.6*  CL 90* 91* 92*  CO2 '22 25 24  '$ GLUCOSE 192* 137* 118*  BUN 56* 36*  41*  CREATININE 4.42* 3.06* 3.60*  CALCIUM 7.6* 7.7* 7.8*   Liver Function Tests: Recent Labs  Lab 01/13/22 0353 01/14/22 0230 01/17/22 1728  AST 37 37 16  ALT '23 27 17  '$ ALKPHOS 180* 198* 188*  BILITOT 1.9* 1.8* 2.0*  PROT 5.6* 6.0* 6.9  ALBUMIN <1.5* 1.6* 2.1*   No results for input(s): "LIPASE", "AMYLASE" in the last 168 hours. No results for input(s): "AMMONIA" in the last 168 hours. CBC: Recent Labs  Lab 01/15/22 0435 01/16/22 0547 01/17/22 1024 01/17/22 1728 01/18/22 0431  WBC 10.4 13.2* 13.1* 8.3 10.5  NEUTROABS  --   --   --  6.9  --   HGB 8.5* 9.8* 9.7* 9.0* 8.8*  HCT 26.6* 30.5* 30.0* 27.2* 27.2*  MCV 82.1 82.2 82.6 79.5* 82.9  PLT 279 338 394 298 304   Cardiac Enzymes: No results for input(s): "CKTOTAL", "CKMB", "CKMBINDEX", "TROPONINI" in the last 168 hours. CBG: Recent Labs  Lab 01/17/22 1111 01/17/22 1716 01/18/22 0002 01/18/22 0359 01/18/22 0725  GLUCAP 208* 127*  148* 123* 111*    Iron Studies: No results for input(s): "IRON", "TIBC", "TRANSFERRIN", "FERRITIN" in the last 72 hours. Studies/Results: DG CHEST PORT 1 VIEW  Result Date: 01/16/2022 CLINICAL DATA:  Short of breath EXAM: PORTABLE CHEST 1 VIEW COMPARISON:  01/10/2022 FINDINGS: Normal cardiac silhouette. New opacity in the RIGHT lower lobe with obscuration of the RIGHT hemidiaphragm and RIGHT heart border. LEFT lung clear. No pneumothorax. IMPRESSION: New RIGHT effusion with atelectasis or infiltrate. Electronically Signed   By: Suzy Bouchard M.D.   On: 01/16/2022 18:17   US ARTERIAL ABI (SCREENING LOWER EXTREMITY)  Result Date: 01/16/2022 CLINICAL DATA:  Diabetes, bilateral ankle osteomyelitis EXAM: NONINVASIVE PHYSIOLOGIC VASCULAR STUDY OF BILATERAL LOWER EXTREMITIES TECHNIQUE: Evaluation of both lower extremities were performed at rest, including calculation of ankle-brachial indices with single level Doppler, pressure and pulse volume recording. COMPARISON:  03/03/2019 FINDINGS:  Right ABI:  1.49 (previously 1.45) Left ABI:  1.63 (previously 1.43) Right Lower Extremity:  Multiphasic arterial waveforms at the ankle. Left Lower Extremity: Normal arterial waveform in the dorsalis pedis, monophasic in the distal posterior tibial. IMPRESSION: Non diagnostic secondary to incompressible vessel calcifications (medial arterial sclerosis of [place]). Electronically Signed   By: Lucrezia Europe M.D.   On: 01/16/2022 15:37   ECHOCARDIOGRAM LIMITED  Result Date: 01/16/2022    ECHOCARDIOGRAM LIMITED REPORT   Patient Name:   Danny [name] Date of Exam: 01/16/2022 Medical Rec #:  [id number]     Height:       73.0 in Accession #:    [id number]    Weight:       263.2 lb Date of Birth:  Dec 01, 1955      BSA:          2.418 m Patient Age:    66 years      BP:           115/64 mmHg Patient Gender: M             HR:           80 bpm. Exam Location:  [name] Procedure: Limited Echo Indications:    Eval LVEF  History:        Patient has prior history of Echocardiogram examinations.                 Arrythmias:Atrial Fibrillation; Risk Factors:Hypertension and                 Diabetes. ESRD on dialysis [facility]).  Sonographer:    Alvino Chapel RCS Referring Phys:   1. Limited study.  2. Left ventricular ejection fraction, by estimation, is 55 to 60%. The left ventricle has normal function. The left ventricle has no regional wall motion abnormalities. There is mild left ventricular hypertrophy. There is the interventricular septum is  flattened in systole and diastole, consistent with right ventricular pressure and volume overload.  3. Right ventricular systolic function is moderately reduced. The right ventricular size is moderately enlarged.  4. The mitral valve is abnormal.  5. The inferior vena cava is dilated in size with <50% respiratory variability, suggesting right atrial pressure of 15 mmHg. Comparison(s): Prior images reviewed side by side. LVEF remains normal. Moderate RV  dysfunction, RVSP was not calculated. FINDINGS  Left Ventricle: Left ventricular ejection fraction, by estimation, is 55 to 60%. The left ventricle has normal function. The left ventricle has no regional wall motion abnormalities. The left ventricular internal cavity size was normal in size. There is  mild left ventricular hypertrophy. The interventricular septum is flattened in systole and diastole, consistent with right ventricular pressure and volume overload. Right Ventricle: The right ventricular size is moderately enlarged. No increase in right ventricular wall thickness. Right ventricular systolic function is moderately reduced. Pericardium: There is no evidence of pericardial effusion. Mitral Valve: The mitral valve is abnormal. There is mild thickening of the mitral valve leaflet(s). There is mild calcification of the mitral valve leaflet(s). Aorta: The aortic root is normal in size and structure. Venous: The inferior vena cava is dilated in size with less than 50% respiratory variability, suggesting right atrial pressure of 15 mmHg. LEFT VENTRICLE PLAX 2D LVIDd:         5.10 cm LVIDs:         3.30 cm LV PW:         1.20 cm LV IVS:        1.30 cm LVOT diam:     2.00 cm LVOT Area:     3.14 cm  LEFT ATRIUM         Index LA diam:    3.80 cm 1.57 cm/m   AORTA Ao Root diam: 3.80 cm  SHUNTS Systemic Diam: 2.00 cm Rozann Lesches MD Electronically signed by Rozann Lesches MD Signature Date/Time: 01/16/2022/1:41:10 PM    Final    Medications: Infusions:  sodium chloride Stopped (01/11/22 0915)   albumin human 60 mL/hr at 01/17/22 1708   albuterol      ceFAZolin (ANCEF) IV Stopped (01/17/22 1234)    Scheduled Medications:  apixaban  5 mg Oral BID   Chlorhexidine Gluconate Cloth  6 each Topical Q0600   darbepoetin (ARANESP) injection - NON-DIALYSIS  100 mcg Subcutaneous Q Thu-1800   ferrous sulfate  325 mg Oral Daily   insulin aspart  0-6 Units Subcutaneous TID WC   insulin detemir  5 Units  Subcutaneous Daily   metoprolol succinate  50 mg Oral Daily   midodrine  20 mg Oral TID WC   mupirocin ointment   Nasal BID   omega-3 acid ethyl esters  1 g Oral Daily   pantoprazole  40 mg Oral Daily   rosuvastatin  5 mg Oral QPM   senna  2 tablet Oral BID   sevelamer carbonate  800 mg Oral BID WC   sodium zirconium cyclosilicate  10 g Oral Daily    have reviewed scheduled and prn medications.  Physical Exam:     General adult male in bed in no acute distress HEENT normocephalic atraumatic extraocular movements intact sclera anicteric Neck supple trachea midline Lungs clear but decreased to auscultation bilaterally normal work of breathing at rest Heart S1S2 no rub Abdomen soft nontender nondistended/obese habitus Extremities 2+ edema arms and legs Psych normal mood and affect LUE AVF bruit and thrill   Vishaal Strollo, MD 01/18/2022,9:33 AM  LOS: 8 days

## 2022-01-19 DIAGNOSIS — Z7189 Other specified counseling: Secondary | ICD-10-CM | POA: Diagnosis not present

## 2022-01-19 DIAGNOSIS — N186 End stage renal disease: Secondary | ICD-10-CM | POA: Diagnosis not present

## 2022-01-19 DIAGNOSIS — J9601 Acute respiratory failure with hypoxia: Secondary | ICD-10-CM | POA: Diagnosis not present

## 2022-01-19 DIAGNOSIS — Z515 Encounter for palliative care: Secondary | ICD-10-CM | POA: Diagnosis not present

## 2022-01-19 DIAGNOSIS — M86679 Other chronic osteomyelitis, unspecified ankle and foot: Secondary | ICD-10-CM | POA: Diagnosis not present

## 2022-01-19 DIAGNOSIS — A419 Sepsis, unspecified organism: Secondary | ICD-10-CM | POA: Diagnosis not present

## 2022-01-19 DIAGNOSIS — I482 Chronic atrial fibrillation, unspecified: Secondary | ICD-10-CM | POA: Diagnosis not present

## 2022-01-19 LAB — GLUCOSE, CAPILLARY: Glucose-Capillary: 109 mg/dL — ABNORMAL HIGH (ref 70–99)

## 2022-01-19 LAB — CBC
HCT: 24.1 % — ABNORMAL LOW (ref 39.0–52.0)
Hemoglobin: 7.8 g/dL — ABNORMAL LOW (ref 13.0–17.0)
MCH: 26.5 pg (ref 26.0–34.0)
MCHC: 32.4 g/dL (ref 30.0–36.0)
MCV: 82 fL (ref 80.0–100.0)
Platelets: 265 10*3/uL (ref 150–400)
RBC: 2.94 MIL/uL — ABNORMAL LOW (ref 4.22–5.81)
RDW: 21.2 % — ABNORMAL HIGH (ref 11.5–15.5)
WBC: 9.6 10*3/uL (ref 4.0–10.5)
nRBC: 0.5 % — ABNORMAL HIGH (ref 0.0–0.2)

## 2022-01-19 LAB — BASIC METABOLIC PANEL
Anion gap: 10 (ref 5–15)
BUN: 28 mg/dL — ABNORMAL HIGH (ref 8–23)
CO2: 29 mmol/L (ref 22–32)
Calcium: 7.5 mg/dL — ABNORMAL LOW (ref 8.9–10.3)
Chloride: 93 mmol/L — ABNORMAL LOW (ref 98–111)
Creatinine, Ser: 2.7 mg/dL — ABNORMAL HIGH (ref 0.61–1.24)
GFR, Estimated: 25 mL/min — ABNORMAL LOW (ref >60)
Glucose, Bld: 105 mg/dL — ABNORMAL HIGH (ref 70–99)
Potassium: 4.1 mmol/L (ref 3.5–5.1)
Sodium: 132 mmol/L — ABNORMAL LOW (ref 135–145)

## 2022-01-19 MED ORDER — LORAZEPAM 2 MG/ML IJ SOLN: 0.5000 mg | INTRAMUSCULAR | Status: DC | PRN

## 2022-01-19 MED ORDER — HYDROMORPHONE HCL 1 MG/ML PO LIQD
0.5000 mg | ORAL | Status: DC | PRN
Start: 2022-01-19 — End: 2022-01-19

## 2022-01-19 MED ORDER — POLYVINYL ALCOHOL 1.4 % OP SOLN: 1.0000 [drp] | Freq: Four times a day (QID) | OPHTHALMIC | Status: DC | PRN

## 2022-01-19 MED ORDER — BIOTENE DRY MOUTH MT LIQD: 15.0000 mL | OROMUCOSAL | Status: DC | PRN

## 2022-01-19 MED ORDER — GLYCOPYRROLATE 0.2 MG/ML IJ SOLN
0.4000 mg | Freq: Four times a day (QID) | INTRAMUSCULAR | Status: DC
  Administered 2022-01-19 (×2): 0.4 mg via INTRAVENOUS
  Filled 2022-01-19 (×2): qty 2

## 2022-01-19 MED ORDER — HYDROMORPHONE HCL 1 MG/ML IJ SOLN
0.5000 mg | INTRAMUSCULAR | Status: DC | PRN
  Administered 2022-01-19 (×2): 0.5 mg via INTRAVENOUS
  Filled 2022-01-19 (×2): qty 0.5

## 2022-01-19 MED ORDER — BISACODYL 10 MG RE SUPP
10.0000 mg | Freq: Every day | RECTAL | Status: DC | PRN
Start: 2022-01-19 — End: 2022-01-20

## 2022-01-19 NOTE — Progress Notes (Signed)
Brief note:  Stopped by room to see pt.  He had Afib with RVR again yesterday with HD although we were able to remove 2L.  He remains significantly volume overloaded.    He is talking with his sister about transitioning to comfort care- there is a family meeting at 64 am.  I did not intrude on their discussion.    I will be available as needed.  I did not write dialysis orders for today.    No charge.  Madelon Lips MD [employer]  Pgr [phone]

## 2022-01-19 NOTE — Progress Notes (Signed)
Report called to [facility], family aware of transport plan and time line. Patient resting comfortably awaiting transport to facility.

## 2022-01-19 NOTE — Discharge Summary (Signed)
Physician Discharge Summary   Patient: Danny [name] MRN: [id number] DOB: 23-Jan-1956  Admit date:     01/10/2022  Discharge date: 01/19/22  Discharge Physician: Shanon Brow Cordon Gassett   PCP: Celene Squibb, MD   Recommendations at discharge:  Transfer to [facility] with focus on full comfort measures    Hospital Course: Danny [name] is a 66 y.o. male with medical history significant of hypertension, ESRD (TTS), A-fib on Eliquis who presents to the emergency department via EMS from a nursing facility due to fever and weakness.   Patient states that he went to dialysis this morning, but no fluid was taken off him. Unable to provide further history, history was obtained from the ED physician and ED medical record.  Per report, patient was found to be hypoxic with O2 sat in the 80s, supplemental oxygen via [place] at 4 LPM was provided.  Patient was reported to be weak and confused today (different from baseline, good sense of humor, AOx3), he was reported to be fine during shift signout at the nursing facility.       ED Course:  Tmax of 102.1F, tachypneic, tachycardic.  Work-up in the ED showed normal CBC except for WBC of 10.6 BMP showed sodium 133, potassium 4.2, chloride 96, bicarb 26, glucose 129, BUN 55, creatinine 4.34, calcium 7.7, albumin 2.0, alkaline phosphatase 187, AST 22, ALT 18, total bilirubin 2.2, eGFR 14, lactic acid 2.0, sed rate 100.  Urinalysis was positive for large leukocytes, > 50 WBC, and many bacteria.  Influenza A, B, SARS coronavirus 2 was negative.  Blood culture pending. Chest x-ray showed possible small pleural effusions.  No overt interstitial edema. He was treated with IV cefepime, Flagyl, vancomycin, Zofran was given, IV hydration was provided.  Tylenol was given due to fever.  Patient's BP continued to be in hypotensive range, he was started on peripheral IV Levophed.   Hospitalist was asked to admit patient for further evaluation and management.    Assessment and  Plan:  Sepsis-septic shock -due to GBS bacteremia/osteomyelitis ankles -initially on levophed -Sepsis/septic shock physiology improving -Off Levophed, status post IV fluid resuscitation, IV antibiotic -7/25--developed afib RVR, hemodynamic instability on HD 7/25>>2.2 L off -Blood cultures are growing group B strep,  - Urine culture growing E. Coli -ESBL -colonized per ID-- no further treatment   -Bilateral lower extremity MRI, consistent with severe arthritis with local wound infection consistent with possible bilateral chronic osteomyelitis -Recommended continue antibiotics of penicillin G initially -Discontinuing broad-spectrum antibiotics of vancomycin, Flagyl, cefepime discontinued on 01/12/2022 -penicillin G treatment for ID recommendation Per Dr. [name]  In Absent a below the knee amputation happening soon plan on giving him 8 weeks of systemic antibiotic Cefazolin with hemodialysis.  --patient is a poor surgical candidate presently --would favor long term abx therapy 01/19/22--after goals of care discussions with palliative medicine, pt has decided to transition focus of care to full comfort measures     Hyponatremia hypokalemia  -In the setting of end-stage renal disease -correction on HD -Nephrology consulted, appreciate close evaluation, hemodialysis And further input regarding prognosis   acute metabolic encephalopathy -continues to have waxing and waning mentation -- likely it was due to sepsis, septic shock -Continue treating underlying causes   Hypotension -acute on chronic  -Blood pressure remains soft but stable -Successfully weaned off Levophed -Continue midodrine>>dose increased   Right foot wound/bilateral ankle osteomyelitis Continue wound care -[place] lower extremity Doppler>>> No evidence of deep venous thrombosis in either lower extremity. -Bilateral lower  extremity MRI, reporting significant inflammation, osteoarthritis, cannot rule out  osteomyelitis -Orthopedic, Dr. Aline Brochure was consulted reviewed imaging, he will further consult Dr. Sharol Given for further evaluation and definitive management... Getting ABI   -Per Dr. [name]  In Absent a below the knee amputation happening soon plan on giving him 8 weeks of systemic antibiotic Cefazolin with hemodialysis.   --patient is a poor surgical candidate presently --would favor long term abx therapy   Lactic acidosis Lactic acid 2.0, >>3.0 >>2.4   continue to trend lactic acid   ESRD on HD (TTS) Continue [facility] Ne[facility] consulted for maintenance dialysis -Planning for hemodialysis 7/20, 7/22, 7/25, 7/26 -patient remains fluid overload 7/26 HD--pt developed Afib once again with low BP limiting optimal dialysis and fluid removal 01/19/22--after goals of care discussions with palliative medicine, pt has decided to transition focus of care to full comfort measures   Paroxysmal Atrial Fib with RVR Continue Eliquis Toprol-XL will be held at this time due to hypotension -Cardiology signed off   Type 2 diabetes mellitus Continue ISS and levemir 5   Iron deficiency anemia Continue ferrous sulfate Continue senna to prevent constipation  Mixed hyperlipidemia Continue statin Continue Lovaza   Obesity (BMI 32.66 kgm) Diet and lifestyle modification   Goals of Care -remains DNR -appreciate palliative following -01/18/22--pt expressed to me that he wished to pursue comfort measures if not able to tolerate HD today -01/19/22--as discussed above, pt had hemodynamic instability again onf HD 7/26 01/19/22--overall prognosis is poor given poor tolerance to HD and remaining overloaded in setting of his numerous co-morbidities 01/19/22--after goals of care discussions with palliative medicine, pt has decided to transition focus of care to full comfort measures -he has continued to decline clinically while in hospital despite optimal medical therapy Prognosis <2 weeks With assistance of  TOC team and palliative medicine, patient will transition to residential hospice with [facility]             Consultants: palliative, renal Procedures performed: HD  Disposition: Hospice care Diet recommendation:  Regular diet DISCHARGE MEDICATION: Allergies as of 01/19/2022       Reactions   Dust Mite Extract Itching, Other (See Comments)   Unknown reaction-potential shortness of breath   Prednisone Nausea And Vomiting   Rocephin [ceftriaxone] Nausea And Vomiting        Medication List     STOP taking these medications    acetaminophen 500 MG tablet Commonly known as: TYLENOL   apixaban 5 MG Tabs tablet Commonly known as: ELIQUIS   ascorbic acid 500 MG tablet Commonly known as: VITAMIN C   B-12 Compliance Injection 1000 MCG/ML Kit Generic drug: Cyanocobalamin   cephALEXin 500 MG capsule Commonly known as: KEFLEX   cholecalciferol 25 MCG (1000 UNIT) tablet Commonly known as: VITAMIN D3   docusate sodium 100 MG capsule Commonly known as: COLACE   ferrous sulfate 325 (65 FE) MG tablet   Fish Oil 500 MG Caps   fluticasone 50 MCG/ACT nasal spray Commonly known as: FLONASE   folic acid 299 MCG tablet Commonly known as: FOLVITE   HUMULIN 70/30 KWIKPEN [place]   Lactulose 20 GM/30ML Soln   metoprolol succinate 50 MG 24 hr tablet Commonly known as: Toprol XL   midodrine 10 MG tablet Commonly known as: PROAMATINE   multivitamin with minerals Tabs tablet   ondansetron 4 MG tablet Commonly known as: ZOFRAN   OXYCODONE HCL PO   polyethylene glycol 17 g packet Commonly known as: MIRALAX / GLYCOLAX   rosuvastatin  5 MG tablet Commonly known as: CRESTOR   senna 8.6 MG tablet Commonly known as: SENOKOT   sevelamer carbonate 800 MG tablet Commonly known as: RENVELA   tamsulosin 0.4 MG Caps capsule Commonly known as: FLOMAX   vitamin E 1000 UNIT capsule   zinc sulfate 220 (50 Zn) MG capsule        Contact information for follow-up providers      [facility].   Specialty: Cardiology Why: Cardiology Follow-up with Dr. Johnsie Cancel on 04/14/2022 at 1:00 PM. Contact information: [place] [phone]             Contact information for after-discharge care     [facility] .   Service: Skilled Nursing Contact information: [place] [phone]                    Discharge Exam: Danley Danker Weights   01/13/22 0500 01/15/22 0400  Weight: 119.2 kg 119.4 kg   HEENT:  [place]/AT, No thrush, no icterus CV:  RRR, no rub, no S3, no S4 Lung:  bibasilar crackles Abd:  soft/+BS, NT Ext:  1 + LE edema, no lymphangitis, no synovitis, no rash   Condition at discharge: stable  The results of significant diagnostics from this hospitalization (including imaging, microbiology, ancillary and laboratory) are listed below for reference.   Imaging Studies: DG CHEST PORT 1 VIEW  Result Date: 01/16/2022 CLINICAL DATA:  Short of breath EXAM: PORTABLE CHEST 1 VIEW COMPARISON:  01/10/2022 FINDINGS: Normal cardiac silhouette. New opacity in the RIGHT lower lobe with obscuration of the RIGHT hemidiaphragm and RIGHT heart border. LEFT lung clear. No pneumothorax. IMPRESSION: New RIGHT effusion with atelectasis or infiltrate. Electronically Signed   By: Suzy Bouchard M.D.   On: 01/16/2022 18:17   US ARTERIAL ABI (SCREENING LOWER EXTREMITY)  Result Date: 01/16/2022 CLINICAL DATA:  Diabetes, bilateral ankle osteomyelitis EXAM: NONINVASIVE PHYSIOLOGIC VASCULAR STUDY OF BILATERAL LOWER EXTREMITIES TECHNIQUE: Evaluation of both lower extremities were performed at rest, including calculation of ankle-brachial indices with single level Doppler, pressure and pulse volume recording. COMPARISON:  03/03/2019 FINDINGS: Right ABI:  1.49 (previously 1.45) Left ABI:  1.63 (previously 1.43) Right Lower Extremity:   Multiphasic arterial waveforms at the ankle. Left Lower Extremity: Normal arterial waveform in the dorsalis pedis, monophasic in the distal posterior tibial. IMPRESSION: Non diagnostic secondary to incompressible vessel calcifications (medial arterial sclerosis of [place]). Electronically Signed   By: Lucrezia Europe M.D.   On: 01/16/2022 15:37   ECHOCARDIOGRAM LIMITED  Result Date: 01/16/2022    ECHOCARDIOGRAM LIMITED REPORT   Patient Name:   Danny [name] Date of Exam: 01/16/2022 Medical Rec #:  [id number]     Height:       73.0 in Accession #:    [id number]    Weight:       263.2 lb Date of Birth:  [birth year]      BSA:          2.418 m Patient Age:    44 years      BP:           115/64 mmHg Patient Gender: M             HR:           80 bpm. Exam Location:  [name] Procedure: Limited Echo Indications:    Eval LVEF  History:  Patient has prior history of Echocardiogram examinations.                 Arrythmias:Atrial Fibrillation; Risk Factors:Hypertension and                 Diabetes. ESRD on dialysis [facility]).  Sonographer:    Alvino Chapel RCS Referring Phys: [place]  1. Limited study.  2. Left ventricular ejection fraction, by estimation, is 55 to 60%. The left ventricle has normal function. The left ventricle has no regional wall motion abnormalities. There is mild left ventricular hypertrophy. There is the interventricular septum is  flattened in systole and diastole, consistent with right ventricular pressure and volume overload.  3. Right ventricular systolic function is moderately reduced. The right ventricular size is moderately enlarged.  4. The mitral valve is abnormal.  5. The inferior vena cava is dilated in size with <50% respiratory variability, suggesting right atrial pressure of 15 mmHg. Comparison(s): Prior images reviewed side by side. LVEF remains normal. Moderate RV dysfunction, RVSP was not calculated. FINDINGS  Left Ventricle: Left ventricular ejection fraction,  by estimation, is 55 to 60%. The left ventricle has normal function. The left ventricle has no regional wall motion abnormalities. The left ventricular internal cavity size was normal in size. There is  mild left ventricular hypertrophy. The interventricular septum is flattened in systole and diastole, consistent with right ventricular pressure and volume overload. Right Ventricle: The right ventricular size is moderately enlarged. No increase in right ventricular wall thickness. Right ventricular systolic function is moderately reduced. Pericardium: There is no evidence of pericardial effusion. Mitral Valve: The mitral valve is abnormal. There is mild thickening of the mitral valve leaflet(s). There is mild calcification of the mitral valve leaflet(s). Aorta: The aortic root is normal in size and structure. Venous: The inferior vena cava is dilated in size with less than 50% respiratory variability, suggesting right atrial pressure of 15 mmHg. LEFT VENTRICLE PLAX 2D LVIDd:         5.10 cm LVIDs:         3.30 cm LV PW:         1.20 cm LV IVS:        1.30 cm LVOT diam:     2.00 cm LVOT Area:     3.14 cm  LEFT ATRIUM         Index LA diam:    3.80 cm 1.57 cm/m   AORTA Ao Root diam: 3.80 cm  SHUNTS Systemic Diam: 2.00 cm Rozann Lesches MD Electronically signed by Rozann Lesches MD Signature Date/Time: 01/16/2022/1:41:10 PM    Final    MR ANKLE LEFT WO CONTRAST  Result Date: 01/13/2022 CLINICAL DATA:  Possible osteomyelitis. EXAM: MRI OF THE LEFT ANKLE WITHOUT CONTRAST TECHNIQUE: Multiplanar, multisequence MR imaging of the ankle was performed. No intravenous contrast was administered. COMPARISON:  Radiographs dated January 13, 2022 FINDINGS: TENDONS Peroneal: Peroneal longus tendon intact. Peroneal brevis intact. Posteromedial: Posterior tibial tendon intact. Flexor hallucis longus tendon intact. Flexor digitorum longus tendon intact. Anterior: Tibialis anterior tendon intact. Extensor hallucis longus tendon intact  Extensor digitorum longus tendon intact. Achilles:  Intact. Plantar Fascia: Intact. LIGAMENTS Lateral: Anterior talofibular ligament intact. Calcaneofibular ligament intact. Posterior talofibular ligament intact. Anterior and posterior tibiofibular ligaments intact. Medial: Deltoid ligament intact. Spring ligament intact. CARTILAGE Ankle Joint: Small joint effusion. Normal ankle mortise. Subchondral cystic changes of the lateral aspect of the talar dome. Mild tibiotalar osteoarthritis. Subtalar Joints/Sinus Tarsi: Normal subtalar joints.  No subtalar joint effusion. Normal sinus tarsi. Mild talonavicular osteoarthritis. Bones: Mild edema of the lateral aspect of the lateral malleolus, in the area of concern which in the presence of adjacent skin wound is concerning for osteomyelitis. No fracture or dislocation. Soft Tissue: Mild skin thickening and subcutaneous soft tissue edema. No fluid collection or abscess. No fluid collection or hematoma. Muscles are normal without edema or atrophy. Tarsal tunnel is normal. IMPRESSION: 1. Bone marrow edema of the lateral malleolus, which in the presence of adjacent skin wound is concerning for osteomyelitis. 2. Mild tibiotalar osteoarthritis with focal subchondral cystic changes of the lateral aspect of the talar dome. Small joint effusion. 3.  Mild talonavicular osteoarthritis. 4.  Ligaments and tendons appear maintained. Electronically Signed   By: Keane Police D.O.   On: 01/13/2022 17:12   MR ANKLE RIGHT WO CONTRAST  Result Date: 01/13/2022 CLINICAL DATA:  Possible osteomyelitis. EXAM: MRI OF THE RIGHT ANKLE WITHOUT CONTRAST TECHNIQUE: Multiplanar, multisequence MR imaging of the ankle was performed. No intravenous contrast was administered. COMPARISON:  Radiographs dated January 13, 2022 FINDINGS: TENDONS Peroneal: Peroneal longus tendon intact. Peroneal brevis intact. Posteromedial: Posterior tibial tendon intact with mild thickening suggesting tendinosis. Flexor hallucis  longus tendon intact. Flexor digitorum longus tendon intact. Anterior: Tibialis anterior tendon intact. Extensor hallucis longus tendon intact Extensor digitorum longus tendon intact. Achilles:  Intact. Plantar Fascia: Intact. LIGAMENTS Lateral: Anterior talofibular ligament intact. Calcaneofibular ligament intact. Posterior talofibular ligament intact. Anterior and posterior tibiofibular ligaments intact. Medial: Deltoid ligament intact. Spring ligament intact. CARTILAGE Ankle Joint: Small joint effusion. Complete loss of the articular cartilage with subchondral cystic changes of the talus and the tibial plafond. Prominent osteophytes about the ankle joint consistent with advanced osteoarthritis. Subtalar Joints/Sinus Tarsi: Normal subtalar joints. No subtalar joint effusion. Normal sinus tarsi. Moderate osteoarthritis of the talonavicular joint with subchondral edema and marginal osteophytes. Bones: Susceptibility artifacts from the surgical hardware in the calcaneus and the fifth metatarsal limits evaluation. Bone marrow edema of the lateral malleolus. Soft Tissue: Mild soft tissue edema and skin thickening about the ankle. No fluid collection. IMPRESSION: 1. Bone marrow edema of the lateral malleolus, which is likely reactive secondary to advanced osteoarthritis and altered mechanics. If there is adjacent skin wound osteomyelitis can not be excluded. 2. Advanced osteoarthritis of the tibiotalar joint, with complete loss of the articular cartilage, subchondral cystic changes and prominent osteophytes. 3.  Moderate osteoarthritis of the talonavicular joint. 4. Tendons of the flexor, extensor and peroneal compartments appear maintained. 5.  No acute ligamentous injury. Electronically Signed   By: Keane Police D.O.   On: 01/13/2022 16:57   DG Ankle Left Port  Result Date: 01/13/2022 CLINICAL DATA:  Osteomyelitis EXAM: PORTABLE LEFT ANKLE - 2 VIEW COMPARISON:  None Available. FINDINGS: Osteopenia. No fracture or  dislocation of the left ankle. Mild ankle mortise and midfoot arthrosis. Extensive vascular calcinosis. IMPRESSION: Osteopenia. No fracture or dislocation of the left ankle. Mild ankle mortise and midfoot arthrosis. No radiographic evidence of osteomyelitis. MRI is the test of choice to evaluate for osteomyelitis and bone marrow edema if suspected. Electronically Signed   By: Delanna Ahmadi M.D.   On: 01/13/2022 12:45   DG Ankle Right Port  Result Date: 01/13/2022 CLINICAL DATA:  Soft tissue wounds. EXAM: PORTABLE RIGHT ANKLE - 2 VIEW COMPARISON:  None Available. FINDINGS: Surgical staples are noted in the calcaneus. Status post surgical fixation of proximal fifth metatarsal. There degenerative change of the talotibial joint is noted. Vascular calcifications  are noted. Extensive osteophyte formation is noted. Cortical irregularity may be involving the lateral portion of the talus; osteomyelitis cannot be excluded. IMPRESSION: Cortical irregularity is seen involving the lateral portion of the talus; osteomyelitis cannot be excluded. Extensive degenerative changes are noted involving the talotibial joint as well as postsurgical changes involving the calcaneus. Electronically Signed   By: Marijo Conception M.D.   On: 01/13/2022 12:43   US Venous Img Lower Bilateral (DVT)  Result Date: 01/11/2022 CLINICAL DATA:  Bilateral lower extremity edema and pain. EXAM: BILATERAL LOWER EXTREMITY VENOUS DOPPLER ULTRASOUND TECHNIQUE: Gray-scale sonography with graded compression, as well as color Doppler and duplex ultrasound were performed to evaluate the lower extremity deep venous systems from the level of the common femoral vein and including the common femoral, femoral, profunda femoral, popliteal and calf veins including the posterior tibial, peroneal and gastrocnemius veins when visible. The superficial great saphenous vein was also interrogated. Spectral Doppler was utilized to evaluate flow at rest and with distal  augmentation maneuvers in the common femoral, femoral and popliteal veins. COMPARISON:  None Available. FINDINGS: RIGHT LOWER EXTREMITY Common Femoral Vein: No evidence of thrombus. Normal compressibility, respiratory phasicity and response to augmentation. Saphenofemoral Junction: No evidence of thrombus. Normal compressibility and flow on color Doppler imaging. Profunda Femoral Vein: No evidence of thrombus. Normal compressibility and flow on color Doppler imaging. Femoral Vein: No evidence of thrombus. Normal compressibility, respiratory phasicity and response to augmentation. Popliteal Vein: No evidence of thrombus. Normal compressibility, respiratory phasicity and response to augmentation. Calf Veins: No evidence of thrombus. Normal compressibility and flow on color Doppler imaging. LEFT LOWER EXTREMITY Common Femoral Vein: No evidence of thrombus. Normal compressibility, respiratory phasicity and response to augmentation. Saphenofemoral Junction: No evidence of thrombus. Normal compressibility and flow on color Doppler imaging. Profunda Femoral Vein: No evidence of thrombus. Normal compressibility and flow on color Doppler imaging. Femoral Vein: No evidence of thrombus. Normal compressibility, respiratory phasicity and response to augmentation. Popliteal Vein: No evidence of thrombus. Normal compressibility, respiratory phasicity and response to augmentation. Calf Veins: No evidence of thrombus. Normal compressibility and flow on color Doppler imaging. IMPRESSION: No evidence of deep venous thrombosis in either lower extremity. Electronically Signed   By: Margaretha Sheffield M.D.   On: 01/11/2022 09:17   DG Chest Port 1 View  Result Date: 01/10/2022 CLINICAL DATA:  Questionable sepsis, Pt is a MWF dialysis pt who went today and reportedly did not receive dialysis, or did not have any fluid off (conflicting stories). Pt has been increasingly weak and today staff noticed him to be febrile. EXAM: PORTABLE CHEST  - 1 VIEW COMPARISON:  12/16/2021 FINDINGS: Lungs are clear. Heart size and mediastinal contours are within normal limits. Blunting of lateral costophrenic angles suggesting small effusions. Visualized bones unremarkable. IMPRESSION: Possible small pleural effusions.  No overt interstitial edema. Electronically Signed   By: Lucrezia Europe M.D.   On: 01/10/2022 17:33    Microbiology: Results for orders placed or performed during the hospital encounter of 01/10/22  Resp Panel by RT-PCR (Flu A&B, Covid) Anterior Nasal Swab     Status: None   Collection Time: 01/10/22  5:20 PM   Specimen: Anterior Nasal Swab  Result Value Ref Range Status   SARS Coronavirus 2 by RT PCR NEGATIVE NEGATIVE Final    Comment: (NOTE) SARS-CoV-2 target nucleic acids are NOT DETECTED.  The SARS-CoV-2 RNA is generally detectable in upper respiratory specimens during the acute phase of infection. The lowest concentration of SARS-CoV-2  viral copies this assay can detect is 138 copies/mL. A negative result does not preclude SARS-Cov-2 infection and should not be used as the sole basis for treatment or other patient management decisions. A negative result may occur with  improper specimen collection/handling, submission of specimen other than nasopharyngeal swab, presence of viral mutation(s) within the areas targeted by this assay, and inadequate number of viral copies(<138 copies/mL). A negative result must be combined with clinical observations, patient history, and epidemiological information. The expected result is Negative.  Fact Sheet for Patients:  EntrepreneurPulse.com.au  Fact Sheet for Healthcare Providers:  IncredibleEmployment.be  This test is no t yet approved or cleared by the [place] FDA and  has been authorized for detection and/or diagnosis of SARS-CoV-2 by FDA under an Emergency Use Authorization (EUA). This EUA will remain  in effect (meaning this test can be  used) for the duration of the COVID-19 declaration under Section 564(b)(1) of the Act, 21 U.S.C.section 360bbb-3(b)(1), unless the authorization is terminated  or revoked sooner.       Influenza A by PCR NEGATIVE NEGATIVE Final   Influenza B by PCR NEGATIVE NEGATIVE Final    Comment: (NOTE) The Xpert Xpress SARS-CoV-2/FLU/RSV plus assay is intended as an aid in the diagnosis of influenza from Nasopharyngeal swab specimens and should not be used as a sole basis for treatment. Nasal washings and aspirates are unacceptable for Xpert Xpress SARS-CoV-2/FLU/RSV testing.  Fact Sheet for Patients: EntrepreneurPulse.com.au  Fact Sheet for Healthcare Providers: IncredibleEmployment.be  This test is not yet approved or cleared by the [place] FDA and has been authorized for detection and/or diagnosis of SARS-CoV-2 by FDA under an Emergency Use Authorization (EUA). This EUA will remain in effect (meaning this test can be used) for the duration of the COVID-19 declaration under Section 564(b)(1) of the Act, 21 U.S.C. section 360bbb-3(b)(1), unless the authorization is terminated or revoked.  Performed at [facility], [address], [place], [place] [address]   Blood Culture (routine x 2)     Status: Abnormal   Collection Time: 01/10/22  5:20 PM   Specimen: BLOOD RIGHT FOREARM  Result Value Ref Range Status   Specimen Description   Final    BLOOD RIGHT FOREARM Performed at [facility], [address] [place], [place] [address]    Special Requests   Final    BOTTLES DRAWN AEROBIC AND ANAEROBIC Blood Culture results may not be optimal due to an inadequate volume of blood received in culture bottles Performed at [facility], [address] [place], [place] [address]    Culture  Setup Time   Final    GRAM POSITIVE COCCI BOTTLES DRAWN AEROBIC AND ANAEROBIC Gram Stain Report Called to,Read Back By and Verified With: FERRAINLO,J_0  BT MATTHEWS,B  7.19.2023 Performed at [facility], [address] [place], [place] [address]    Culture (A)  Final    GROUP B STREP(S.AGALACTIAE)ISOLATED SUSCEPTIBILITIES PERFORMED ON PREVIOUS CULTURE WITHIN THE LAST 5 DAYS. Performed at [facility], [place] [address], [place], [place] [address]    Report Status 01/13/2022 FINAL  Final  Blood Culture (routine x 2)     Status: Abnormal   Collection Time: 01/10/22  5:20 PM   Specimen: BLOOD  Result Value Ref Range Status   Specimen Description   Final    BLOOD RIGHT ANTECUBITAL Performed at [facility], [place] [address] [place],  [address]    Special Requests   Final    BOTTLES DRAWN  AEROBIC AND ANAEROBIC Blood Culture adequate volume Performed at [facility], [address] [place], [place] [address]    Culture  Setup Time   Final    GRAM POSITIVE COCCI BOTTLES DRAWN AEROBIC AND ANAEROBIC Gram Stain Report Called to,Read Back By and Verified With: FERRAINLO,J_0  BY MAATHEWS,B 7.19.2023 Organism ID to follow CRITICAL RESULT CALLED TO, READ BACK BY AND VERIFIED WITH: Chucky May PHARMD, AT [facility] Performed at [facility], [place] [address] [place], [place] [address]    Culture GROUP B STREP(S.AGALACTIAE)ISOLATED (A)  Final   Report Status 01/13/2022 FINAL  Final   Organism ID, Bacteria GROUP B STREP(S.AGALACTIAE)ISOLATED  Final      Susceptibility   Group b strep(s.agalactiae)isolated - MIC*    CLINDAMYCIN <=0.25 SENSITIVE Sensitive     AMPICILLIN <=0.25 SENSITIVE Sensitive     ERYTHROMYCIN 0.5 INTERMEDIATE Intermediate     VANCOMYCIN 0.5 SENSITIVE Sensitive     CEFTRIAXONE <=0.12 SENSITIVE Sensitive     LEVOFLOXACIN 0.5 SENSITIVE Sensitive     PENICILLIN Value in next row Sensitive      SENSITIVE0.12    * GROUP B STREP(S.AGALACTIAE)ISOLATED  Blood Culture ID Panel (Reflexed)     Status: Abnormal   Collection Time: 01/10/22  5:20 PM  Result Value Ref Range Status   Enterococcus faecalis NOT DETECTED  NOT DETECTED Final   Enterococcus Faecium NOT DETECTED NOT DETECTED Final   Listeria monocytogenes NOT DETECTED NOT DETECTED Final   Staphylococcus species NOT DETECTED NOT DETECTED Final   Staphylococcus aureus (BCID) NOT DETECTED NOT DETECTED Final   Staphylococcus epidermidis NOT DETECTED NOT DETECTED Final   Staphylococcus lugdunensis NOT DETECTED NOT DETECTED Final   Streptococcus species DETECTED (A) NOT DETECTED Final    Comment: CRITICAL RESULT CALLED TO, READ BACK BY AND VERIFIED WITH: G. COFFEE PHARMD, AT [id number] [id number] D. VANHOOK    Streptococcus agalactiae DETECTED (A) NOT DETECTED Final    Comment: CRITICAL RESULT CALLED TO, READ BACK BY AND VERIFIED WITH: G. COFFEE PHARMD, AT [id number] D. VANHOOK    Streptococcus pneumoniae NOT DETECTED NOT DETECTED Final   Streptococcus pyogenes NOT DETECTED NOT DETECTED Final   A.calcoaceticus-baumannii NOT DETECTED NOT DETECTED Final   Bacteroides fragilis NOT DETECTED NOT DETECTED Final   Enterobacterales NOT DETECTED NOT DETECTED Final   Enterobacter cloacae complex NOT DETECTED NOT DETECTED Final   Escherichia coli NOT DETECTED NOT DETECTED Final   Klebsiella aerogenes NOT DETECTED NOT DETECTED Final   Klebsiella oxytoca NOT DETECTED NOT DETECTED Final   Klebsiella pneumoniae NOT DETECTED NOT DETECTED Final   Proteus species NOT DETECTED NOT DETECTED Final   Salmonella species NOT DETECTED NOT DETECTED Final   Serratia marcescens NOT DETECTED NOT DETECTED Final   Haemophilus influenzae NOT DETECTED NOT DETECTED Final   Neisseria meningitidis NOT DETECTED NOT DETECTED Final   Pseudomonas aeruginosa NOT DETECTED NOT DETECTED Final   Stenotrophomonas maltophilia NOT DETECTED NOT DETECTED Final   Candida albicans NOT DETECTED NOT DETECTED Final   Candida auris NOT DETECTED NOT DETECTED Final   Candida glabrata NOT DETECTED NOT DETECTED Final   Candida krusei NOT DETECTED NOT DETECTED Final   Candida parapsilosis NOT DETECTED NOT  DETECTED Final   Candida tropicalis NOT DETECTED NOT DETECTED Final   Cryptococcus neoformans/gattii NOT DETECTED NOT DETECTED Final    Comment: Performed at [facility], [address] [place], [place] [address]  Urine Culture     Status: Abnormal   Collection Time:  01/10/22  5:48 PM   Specimen: Urine, Clean Catch  Result Value Ref Range Status   Specimen Description   Final    URINE, CLEAN CATCH Performed at [facility], [address] [place], [place] [address]    Special Requests   Final    NONE Performed at [facility], [address], [place], [place] [address]    Culture (A)  Final    >=100,000 COLONIES/mL ESCHERICHIA COLI Confirmed Extended Spectrum Beta-Lactamase Producer (ESBL).  In bloodstream infections from ESBL organisms, carbapenems are preferred over piperacillin/tazobactam. They are shown to have a lower risk of mortality.    Report Status 01/13/2022 FINAL  Final   Organism ID, Bacteria ESCHERICHIA COLI (A)  Final      Susceptibility   Escherichia coli - MIC*    AMPICILLIN >=32 RESISTANT Resistant     CEFAZOLIN >=64 RESISTANT Resistant     CEFEPIME >=32 RESISTANT Resistant     CEFTRIAXONE >=64 RESISTANT Resistant     CIPROFLOXACIN >=4 RESISTANT Resistant     GENTAMICIN >=16 RESISTANT Resistant     IMIPENEM <=0.25 SENSITIVE Sensitive     NITROFURANTOIN 128 RESISTANT Resistant     TRIMETH/SULFA >=320 RESISTANT Resistant     AMPICILLIN/SULBACTAM >=32 RESISTANT Resistant     PIP/TAZO 16 SENSITIVE Sensitive     * >=100,000 COLONIES/mL ESCHERICHIA COLI  Culture, blood (x 2)     Status: None   Collection Time: 01/11/22  7:48 AM   Specimen: BLOOD  Result Value Ref Range Status   Specimen Description   Final    BLOOD BLOOD RIGHT HAND Performed at [facility], [address] [place], [place] [address]    Special Requests   Final    BOTTLES DRAWN AEROBIC AND ANAEROBIC Blood Culture results may not be optimal due to an inadequate  volume of blood received in culture bottles Performed at [facility], [address] [place], [place] [address]    Culture   Final    NO GROWTH 5 DAYS Performed at [facility], [address] [place], [place] [address]    Report Status 01/16/2022 FINAL  Final  Culture, blood (x 2)     Status: None   Collection Time: 01/11/22  7:58 AM   Specimen: BLOOD  Result Value Ref Range Status   Specimen Description   Final    BLOOD BLOOD RIGHT HAND Performed at [facility], [place] [address] [place], [place] [address]    Special Requests   Final    BOTTLES DRAWN AEROBIC AND ANAEROBIC Blood Culture results may not be optimal due to an inadequate volume of blood received in culture bottles Performed at [facility], [address], [place], [place] [address]    Culture   Final    NO GROWTH 5 DAYS Performed at [facility], [address], [place], [place] [address]    Report Status 01/16/2022 FINAL  Final  MRSA Next Gen by PCR, Nasal     Status: Abnormal   Collection Time: 01/11/22 11:02 AM   Specimen: Nasal Mucosa; Nasal Swab  Result Value Ref Range Status   MRSA by PCR Next Gen DETECTED (A) NOT DETECTED Final    Comment: RESULT CALLED TO, READ BACK BY AND VERIFIED WITH: DANNY GREBER @ 3016 ON 01/11/22 C VARNER (NOTE) The GeneXpert MRSA Assay (FDA approved for NASAL specimens only), is one component of a comprehensive MRSA colonization surveillance program. It is  not intended to diagnose MRSA infection nor to guide or monitor treatment for MRSA infections. Test performance is not FDA approved in patients less than 60 years old. Performed at [facility], [address] [place], [place] [address]   Culture, blood (Routine X 2) w Reflex to ID Panel     Status: None   Collection Time: 01/12/22  1:53 AM   Specimen: BLOOD RIGHT FOREARM  Result Value Ref Range Status   Specimen Description BLOOD RIGHT FOREARM  Final    Special Requests   Final    BOTTLES DRAWN AEROBIC ONLY Blood Culture adequate volume   Culture   Final    NO GROWTH 5 DAYS Performed at [facility], [address] [place], [place] [address]    Report Status 01/17/2022 FINAL  Final  Culture, blood (Routine X 2) w Reflex to ID Panel     Status: None   Collection Time: 01/12/22  1:53 AM   Specimen: Right Antecubital; Blood  Result Value Ref Range Status   Specimen Description RIGHT ANTECUBITAL  Final   Special Requests   Final    BOTTLES DRAWN AEROBIC AND ANAEROBIC Blood Culture results may not be optimal due to an excessive volume of blood received in culture bottles   Culture   Final    NO GROWTH 5 DAYS Performed at [facility], [address] [place] [address], [place], [place] [address]    Report Status 01/17/2022 FINAL  Final    Labs: CBC: Recent Labs  Lab 01/16/22 0547 01/17/22 1024 01/17/22 1728 01/18/22 0431 01/19/22 0428  WBC 13.2* 13.1* 8.3 10.5 9.6  NEUTROABS  --   --  6.9  --   --   HGB 9.8* 9.7* 9.0* 8.8* 7.8*  HCT 30.5* 30.0* 27.2* 27.2* 24.1*  MCV 82.2 82.6 79.5* 82.9 82.0  PLT 338 394 298 304 563   Basic Metabolic Panel: Recent Labs  Lab 01/13/22 0353 01/14/22 0230 01/16/22 0547 01/17/22 1024 01/17/22 1728 01/18/22 0431 01/19/22 0428  NA 131*   < > 128* 125* 128* 129* 132*  K 4.2   < > 5.2* 6.5* 4.5 5.6* 4.1  CL 98   < > 91* 90* 91* 92* 93*  CO2 26   < > _0 GLUCOSE 165*   < > 135* 192* 137* 118* 105*  BUN 45*   < > 44* 56* 36* 41* 28*  CREATININE 3.61*   < > 3.75* 4.42* 3.06* 3.60* 2.70*  CALCIUM 7.4*   < > 7.8* 7.6* 7.7* 7.8* 7.5*  MG 1.8  --   --   --   --   --   --    < > = values in this interval not displayed.   Liver Function Tests: Recent Labs  Lab 01/13/22 0353 01/14/22 0230 01/17/22 1728  AST 37 37 16  ALT _1 ALKPHOS 180* 198* 188*  BILITOT 1.9* 1.8* 2.0*  PROT 5.6* 6.0* 6.9  ALBUMIN <1.5* 1.6* 2.1*   CBG: Recent Labs  Lab 01/18/22 0725 01/18/22 1128  01/18/22 1605 01/18/22 2142 01/19/22 0815  GLUCAP 111* 113* 111* 96 109*    Discharge time spent: greater than 30 minutes.  Signed: Orson Eva, MD [facility] 01/19/2022

## 2022-01-19 NOTE — TOC Progression Note (Signed)
Transition of Care [facility]) - Progression Note    Patient Details  Name: Danny [name] MRN: [id number] Date of Birth: January 01, 1956  Transition of Care [facility]) CM/SW Contact  Salome Arnt, [place] Phone Number: 01/19/2022, 10:57 AM  Clinical Narrative:  Pt is transitioning to full comfort care. Referral sent to [facility] as requested by family.       Expected Discharge Plan: Skilled Nursing Facility Barriers to Discharge: Continued Medical Work up  Expected Discharge Plan and Services Expected Discharge Plan: [place]                                               Social Determinants of Health (SDOH) Interventions    Readmission Risk Interventions    10/13/2021    9:08 AM 01/27/2021   12:30 PM 01/13/2021    1:30 PM  Readmission Risk Prevention Plan  Transportation Screening Complete Complete Complete  Medication Review Press photographer) Complete Complete Complete  PCP or Specialist appointment within 3-5 days of discharge Complete Complete   HRI or Home Care Consult Complete Complete Complete  SW Recovery Care/Counseling Consult Complete Complete Complete  Palliative Care Screening Not Applicable Not Complete Not Complete  Skilled Nursing Facility Complete Complete Complete

## 2022-01-19 NOTE — Plan of Care (Signed)
  Problem: Education: Goal: Ability to describe self-care measures that may prevent or decrease complications (Diabetes Survival Skills Education) will improve Outcome: Progressing Goal: Individualized Educational Video(s) Outcome: Progressing   Problem: Coping: Goal: Ability to adjust to condition or change in health will improve Outcome: Progressing   Problem: Fluid Volume: Goal: Ability to maintain a balanced intake and output will improve Outcome: Progressing   Problem: Health Behavior/Discharge Planning: Goal: Ability to identify and utilize available resources and services will improve Outcome: Progressing Goal: Ability to manage health-related needs will improve Outcome: Progressing   Problem: Metabolic: Goal: Ability to maintain appropriate glucose levels will improve Outcome: Progressing   Problem: Nutritional: Goal: Maintenance of adequate nutrition will improve Outcome: Progressing Goal: Progress toward achieving an optimal weight will improve Outcome: Progressing   Problem: Skin Integrity: Goal: Risk for impaired skin integrity will decrease Outcome: Progressing   Problem: Tissue Perfusion: Goal: Adequacy of tissue perfusion will improve Outcome: Progressing   Problem: Education: Goal: Knowledge of General Education information will improve Description: Including pain rating scale, medication(s)/side effects and non-pharmacologic comfort measures Outcome: Progressing   Problem: Health Behavior/Discharge Planning: Goal: Ability to manage health-related needs will improve Outcome: Progressing   Problem: Clinical Measurements: Goal: Ability to maintain clinical measurements within normal limits will improve Outcome: Progressing Goal: Will remain free from infection Outcome: Progressing Goal: Diagnostic test results will improve Outcome: Progressing Goal: Respiratory complications will improve Outcome: Progressing Goal: Cardiovascular complication will  be avoided Outcome: Progressing   Problem: Activity: Goal: Risk for activity intolerance will decrease Outcome: Progressing   Problem: Nutrition: Goal: Adequate nutrition will be maintained Outcome: Progressing   Problem: Coping: Goal: Level of anxiety will decrease Outcome: Progressing   Problem: Elimination: Goal: Will not experience complications related to bowel motility Outcome: Progressing Goal: Will not experience complications related to urinary retention Outcome: Progressing   Problem: Pain Managment: Goal: General experience of comfort will improve Outcome: Progressing   Problem: Safety: Goal: Ability to remain free from injury will improve Outcome: Progressing   Problem: Skin Integrity: Goal: Risk for impaired skin integrity will decrease Outcome: Progressing   Problem: Fluid Volume: Goal: Hemodynamic stability will improve Outcome: Progressing   Problem: Clinical Measurements: Goal: Diagnostic test results will improve Outcome: Progressing Goal: Signs and symptoms of infection will decrease Outcome: Progressing   Problem: Respiratory: Goal: Ability to maintain adequate ventilation will improve Outcome: Progressing

## 2022-01-19 NOTE — Progress Notes (Addendum)
Palliative:  HPI: 66 y.o. male  with past medical history of ESRD on HD, HTN, afib on Eliquis, HFpEF, anemia of chronic illness, diabetes, foot ulcer (concern for early osteomyelitis on past admission), gout, urinary retention admitted from [place] facility on 01/10/2022 with sepsis potentially due to right leg cellulitis.   I met today with Ivie and his sister/POA, Viola - "Vi" - and brother in law at bedside. Lorenso continues to be extremely weak. He is still able to talk and able to express to me that he is tired and at this time he is prepared to focus on his comfort and go to heave. He tells me that he is not afraid to die. He is tired of feeling short of breath and has not been able to truly rest due to his discomfort from shortness of breath. He continues with congested cough although his cough is much weaker today versus yesterday. He continues with significant fluid overload that continues to cause him shortness of breath - even despite being able to remove 2L in HD yesterday. He has needed BiPAP recently and I anticipate further symptom burden without these interventions. He is at peace with his decision to to move forward with full comfort care. He would like medication to help him rest. I discussed with Arlester and Vi that we can provide continuous infusion of opioid medication to help him maintain rest and comfort that will also help with his shortness of breath. They both agree that this is what they want. We discussed plan to initiate after they are able to visit together this morning. We discussed plan from here and Vi would want him at hospice facility in [facility] where all family can be with him - Otha agrees that this is his wish as well. I will have have TOC reach out to hospice this morning because I believe the sooner we can get him to [facility] the better. He has been having a rapid decline over the past week. He appears worse today verses yesterday.   All  questions/concerns addressed. Emotional support provided.   Exam: Alert, Oriented and able to talk - although even this is difficult. Severe weakness, fatigue. Shortness of breath at rest. Abd soft. Wounds to bilateral ankles/lower legs.   Plan: - DNR - Full comfort care - Hopeful for hospice placement in [facility] - Prognosis < 2 weeks (more likely days) - Continue spiritual care support - PRN comfort medications ordered with plans to ultimately begin opioid infusion (dilaudid 0.5 mg/hr to begin with bolus 0.5 mg every 30 min PRN recommended) - Robinul scheduled  50 min  Vinie Sill, NP Palliative Medicine Team Pager [phone] (Please see [email or web address] for schedule) Team Phone [phone]    Greater than 50%  of this time was spent counseling and coordinating care related to the above assessment and plan

## 2022-01-19 NOTE — TOC Transition Note (Addendum)
Transition of Care [facility]) - CM/SW Discharge Note   Patient Details  Name: Danny [name] MRN: [id number] Date of Birth: 10-Mar-1956  Transition of Care [facility]) CM/SW Contact:  Salome Arnt, [place] Phone Number: 01/19/2022, 1:30 PM   Clinical Narrative:  Pt accepted at [facility]. Pt's sister notified and accepts bed. She will complete admission paperwork at facility today. Pt can arrive anytime after 7:00 tonight. LCSW will set up Home transport for 6:15. RN given number to call report. MD notified and LCSW will fax d/c summary when completed.      Final next level of care: Strong Barriers to Discharge: Barriers Resolved   Patient Goals and CMS Choice Patient states their goals for this hospitalization and ongoing recovery are:: return to SNF   Choice offered to / list presented to : Sibling  Discharge Placement                Patient to be transferred to facility by: [facility] Name of family member notified: Vi Patient and family notified of of transfer: 01/19/22  Discharge Plan and Services                                     Social Determinants of Health (SDOH) Interventions     Readmission Risk Interventions    10/13/2021    9:08 AM 01/27/2021   12:30 PM 01/13/2021    1:30 PM  Readmission Risk Prevention Plan  Transportation Screening Complete Complete Complete  Medication Review Press photographer) Complete Complete Complete  PCP or Specialist appointment within 3-5 days of discharge Complete Complete   HRI or Home Care Consult Complete Complete Complete  SW Recovery Care/Counseling Consult Complete Complete Complete  Palliative Care Screening Not Applicable Not Complete Not Complete  Skilled Nursing Facility Complete Complete Complete

## 2022-01-19 NOTE — Progress Notes (Signed)
Patient Information  Patient Name  Jazz, Rogala ([id number]) Legal Sex  Male DOB  Nov 14, 1955  Room Bed  IC04 IC04-01    Patient Demographics  Address  [place] [place]  [place] [address] Phone  [phone] (Home)  [phone] (Mobile) *Preferred* E-mail Address  [email or web address]    Basic Information  Date Of Birth  01-14-1956 Gender Identity  Male Race  White or Caucasian Ethnic Group  Not Hispanic or Latino Preferred Language  English    Patient Contacts  Name Relation Home Work Mobile  [place] Sister [phone]  [phone]  Billey Gosling Niece [phone]  [phone]  raif, chachere [phone]  [phone]    Documents on File   Status Date Received Description  Documents for the Patient  [place] E-Signature HIPAA Notice of Privacy Signed 01/20/18   [place] E-Signature HIPAA Notice of Privacy Spanish     [place] HIPAA NOTICE OF PRIVACY - Scanned Received 06/27/18 HIPPA 3212  Driver's License Not Received 08/10/21 ncdl exp [id number]  Insurance Card Not Received (Expired)  Ferol Luz old  Advance Directives/Living Will/HCPOA/POA Not Received    Advanced Beneficiary Notice (ABN) Not Received    E-Signature AOB  Spanish Signed 04/28/16   Other Photo ID Not Received    HIM ROI Authorization  09/25/16 NO AUTHO  HIM ROI Authorization  (Expired) 09/26/16 pt acct J. Cook  HIM ROI Authorization  11/13/16   HIM ROI Authorization  11/27/16 no auth  HIM ROI Authorization  03/01/18   Insurance Card Received (Expired) 04/26/18   Insurance Card Received (Expired) 06/03/18  old  Release of Information Not Received    Insurance Card Received (Expired) 08/29/18 Insurance Card/ 05/20/2018[facility]  HIM ROI Authorization Received 02/04/19 mailed to Dr [facility] E-Signature HIPAA Notice of Privacy Unable to Obtain 03/18/19   [place] E-Signature HIPAA Notice of Privacy Unable to Obtain 03/20/19    Insurance Card Received (Expired) 06/29/19 new card 2021  AMB Outside Consult Note Received 04/02/19 UROLOGY [place] OFFICE NOTE  AMB Outside Consult Note Received 04/08/19 UROLOGY [place] OFFICE NOTE  Insurance Card Not Received (Expired) 02/03/20 pt didn't have card today  Release of Information Received 07/02/19   AMB Outside Consult Note Received 04/08/19 UROLOGY [place] OFFICE NOTE  AMB Outside Consult Note Received 05/14/19 UROLOGY [place] OFFICE NOTE  AMB Correspondence Received 07/29/19 [place] PROCEDURE [facility] N[facility]SE VISIT [facility]  HIM ROI Authorization  09/03/19   HIM ROI Authorization Received 10/14/19 [facility] Authorization Received 10/20/19 [facility] Card Received (Expired) 02/05/20 HUMANA  [place] HIPAA NOTICE OF PRIVACY - Scanned Received 03/23/20 [employer] verbal consent  HIM ROI Authorization Received 03/70/48 [place]  HIM ROI Authorization Received 88/91/69 [place]  HIM ROI Authorization  (Expired) 04/26/20 humana req [id number] Acct [id number]  Insurance Card Received (Expired) 07/06/20 No longer active  AMB Referral Received 08/23/20 REFERRAL TO [facility] FROM DR BHUTANI  Release of Information Received 09/10/20 DPR  AMB Outside Consult Note Received 10/28/20 URODYNAMICS NOTE FROM [facility]  HIM ROI Authorization Received 80/03/49 [facility] [place]  HIM ROI Authorization Received 17/91/50 [facility] [place]  [facility] Policy for Pain - E Signature     HIM ROI Authorization Received 56/97/94 [place] E-Signature HIPAA Notice of Privacy Signed 03/13/21   AMB Provider Completed Forms Received 04/21/21 P-HYSICIAN'S PROGRESS Oswaldo Conroy,  MD [place] Card Received 08/10/21 BCBS  2023  Insurance Card Not Received 08/10/21 Medicaid card not available  HIM ROI Authorization Received 02/54/27 [facility] [place]   HIM ROI Authorization Received 12/17/74 [facility] [place]  HIM ROI Authorization Received 28/31/51 [facility]  AMB Referral   Amb Referral/[facility] /OCR  AMB Referral     HIM ROI Authorization Received 01/17/22 [facility]  Patient Photo   Photo of Patient  HIM Release of Information Output  (Deleted) 03/01/18 Requested records  Insurance Card  (Deleted)    HIM Release of Information Output  09/03/19 Requested records  HIM Release of Information Output  09/03/19 Created on 09/03/2019  HIM Release of Information Output  10/15/19   HIM Release of Information Output  10/20/19   HIM Release of Information Output  11/07/19 Requested records  HIM Release of Information Output  04/01/20 Requested records  HIM Release of Information Output  04/01/20   HIM Release of Information Output  04/26/20 Requested records  HIM Release of Information Output  11/08/20 Requested records  HIM Release of Information Output  11/09/20 Requested records  HIM Release of Information Output  02/07/21 Requested records  HIM Release of Information Output  08/22/21 Requested records  HIM Release of Information Output  08/23/21 Requested records  HIM Release of Information Output  10/17/21 Requested records  HIM Release of Information Output  10/19/21 Requested records  HIM Release of Information Output  01/17/22 Requested records  Documents for the Encounter  AOB  (Assignment of Insurance Benefits) Not Received    E-signature AOB     MEDICARE RIGHTS Not Received    E-signature Medicare Rights Signed 01/10/22   Annual Exam - E-Signature PCP     E-signature AOB Signed 01/10/22   EMS Run Sheet Received 01/10/22   ED Patient Billing Extract   ED PB Billing Extract  Photos     Photos   Stage 2 coccyx   Photos   Lt ankle  Photos   Rt ankle  Photos   Rt arm skin tear  Photos   Lt ankle   Cardiac Monitoring Strip Received 01/15/22   Cardiac Monitoring Strip Shift  Summary Received 01/15/22   Echocardiogram Limited Received 01/16/22     Admission Information   Current Information  Attending Provider Admitting Provider Admission Type Admission Status  Tat, Shanon Brow, MD Deatra James, MD Emergency Confirmed Admission        Admission Date/Time Discharge Date Hospital Service Auth/Cert Status  76/16/07  [place] [facility] Room/Bed   [facility] AP-ICC[facility] NURSING IC04/IC04-01             Admission  Complaint  Cold chills, fever    Hospital Account  Name Acct ID Class Status Primary Coverage  Zeki, Bedrosian [id number] Inpatient Open BLUE CROSS BLUE SHIELD MEDICARE - BCBS MEDICARE        Guarantor Account (for Hospital Account [id number])  Name Relation to Pt Service Area Active? Acct Type  [name] Self CHSA Yes Personal/Family  Address Phone    [place]  [place], [place] [address] [phone])          Coverage Information (for Hospital Account [id number])   1. BLUE CROSS BLUE SHIELD MEDICARE/BCBS MEDICARE  F/O Payor/Plan Precert #  [facility] SHIELD MEDICARE/BCBS MEDICARE   Subscriber Subscriber #  [place], [place] [id number]  Address Phone  [address]  [place], [place] [address] [phone]   2. MEDICAID [place]/MEDICAID OF [place]  F/O Payor/Plan Precert #  MEDICAID [place]/MEDICAID OF    Subscriber Subscriber #  [place], Fritchman [id number]  Address Phone  [address] [address]  [place],  [address] [id number]          Care Everywhere ID:  [id number]

## 2022-01-24 DEATH — deceased

## 2022-01-25 ENCOUNTER — Ambulatory Visit (HOSPITAL_COMMUNITY): Payer: Medicare Other | Admitting: Physician Assistant

## 2022-01-27 ENCOUNTER — Inpatient Hospital Stay (HOSPITAL_COMMUNITY): Payer: Medicaid Other

## 2022-02-03 ENCOUNTER — Encounter (HOSPITAL_COMMUNITY): Admission: RE | Admit: 2022-02-03 | Payer: Medicare Other | Source: Ambulatory Visit

## 2022-02-07 ENCOUNTER — Encounter (HOSPITAL_COMMUNITY): Admission: RE | Payer: Self-pay | Source: Home / Self Care

## 2022-02-07 ENCOUNTER — Ambulatory Visit (HOSPITAL_COMMUNITY): Admission: RE | Admit: 2022-02-07 | Payer: Medicare Other | Source: Home / Self Care | Admitting: Gastroenterology

## 2022-02-07 SURGERY — COLONOSCOPY WITH PROPOFOL
Anesthesia: Monitor Anesthesia Care

## 2022-02-10 ENCOUNTER — Inpatient Hospital Stay (HOSPITAL_COMMUNITY): Payer: Medicaid Other

## 2022-02-13 ENCOUNTER — Encounter (HOSPITAL_COMMUNITY): Payer: Self-pay | Admitting: Hematology

## 2022-02-22 ENCOUNTER — Ambulatory Visit: Payer: Medicaid Other | Admitting: Infectious Disease

## 2022-02-22 NOTE — Progress Notes (Deleted)
Subjective:    Patient ID: Danny [name], male    DOB: 03-Jan-1956, 66 y.o.   MRN: [id number]  HPI  Past Medical History:  Diagnosis Date   Anemia    Anemia in ESRD (end-stage renal disease) ([place]) 11/28/2021   Ankle ulcer (HCC)    Arthritis    CKD (chronic kidney disease), stage V (HCC)    Diabetes mellitus without complication (HCC)    Diastolic congestive heart failure ([place])    Foot ulcer ([place])    Gout    Hypertension    Urinary retention     Past Surgical History:  Procedure Laterality Date   ANKLE SURGERY Right    AV FISTULA PLACEMENT Left 04/26/2021   Procedure: LEFT ARM ARTERIOVENOUS (AV) FISTULA CREATION (BRACHIOCEPHALIC);  Surgeon: Rosetta Posner, MD;  Location: [facility];  Service: Vascular;  Laterality: Left;   CHOLECYSTECTOMY     FISTULA SUPERFICIALIZATION Left 07/12/2021   Procedure: LEFT ARM ARTERIOVENOUS FISTULA SUPERFICIALIZATION;  Surgeon: Rosetta Posner, MD;  Location: [facility];  Service: Vascular;  Laterality: Left;   FOOT SURGERY Right    INSERTION OF DIALYSIS CATHETER Right 01/14/2021   Procedure: INSERTION OF TUNNELED DIALYSIS CATHETER RIGHT INTERNAL JUGULAR;  Surgeon: Virl Cagey, MD;  Location: [facility];  Service: General;  Laterality: Right;   REMOVAL OF A DIALYSIS CATHETER N/A 09/06/2021   Procedure: MINOR REMOVAL OF TUNNELED DIALYSIS CATHETER;  Surgeon: Rosetta Posner, MD;  Location: [facility];  Service: Vascular;  Laterality: N/A;   TRANSURETHRAL RESECTION OF PROSTATE N/A 01/15/2020   Procedure: TRANSURETHRAL RESECTION OF THE PROSTATE (TURP)  with General anesthesia and spinal;  Surgeon: Cleon Gustin, MD;  Location: [facility];  Service: Urology;  Laterality: N/A;    Family History  Problem Relation Age of Onset   Diabetes Mother    Heart attack Mother    Heart attack Father    Diabetes Brother       Social History   Socioeconomic History   Marital status: Divorced    Spouse name: Not on file   Number of children: Not on file   Years of education:  Not on file   Highest education level: Not on file  Occupational History   Not on file  Tobacco Use   Smoking status: Former    Packs/day: 1.50    Years: 10.00    Total pack years: 15.00    Types: Cigarettes    Quit date: 06/02/1987    Years since quitting: 34.7    Passive exposure: Past   Smokeless tobacco: Never  Vaping Use   Vaping Use: Never used  Substance and Sexual Activity   Alcohol use: Not Currently   Drug use: Never   Sexual activity: Not Currently  Other Topics Concern   Not on file  Social History Narrative   Not on file   Social Determinants of Health   Financial Resource Strain: Low Risk  (09/13/2020)   Overall Financial Resource Strain (CARDIA)    Difficulty of Paying Living Expenses: Not very hard  Food Insecurity: No Food Insecurity (09/13/2020)   Hunger Vital Sign    Worried About Running Out of Food in the Last Year: Never true    [place] in the Last Year: Never true  Transportation Needs: No Transportation Needs (09/13/2020)   PRAPARE - Hydrologist (Medical): No    Lack of Transportation (Non-Medical): No  Physical Activity: Inactive (09/13/2020)   Exercise  Vital Sign    Days of Exercise per Week: 0 days    Minutes of Exercise per Session: 0 min  Stress: No Stress Concern Present (09/13/2020)   [place]    Feeling of Stress : Only a little  Social Connections: Not on file    Allergies  Allergen Reactions   Dust Mite Extract Itching and Other (See Comments)    Unknown reaction-potential shortness of breath   Prednisone Nausea And Vomiting   Rocephin [Ceftriaxone] Nausea And Vomiting    No current outpatient medications on file.   Review of Systems     Objective:   Physical Exam        Assessment & Plan:

## 2022-02-24 ENCOUNTER — Inpatient Hospital Stay (HOSPITAL_COMMUNITY): Payer: Medicaid Other

## 2022-03-10 ENCOUNTER — Inpatient Hospital Stay (HOSPITAL_COMMUNITY): Payer: Medicaid Other | Admitting: Physician Assistant

## 2022-03-10 ENCOUNTER — Inpatient Hospital Stay (HOSPITAL_COMMUNITY): Payer: Medicaid Other

## 2022-03-30 ENCOUNTER — Ambulatory Visit (INDEPENDENT_AMBULATORY_CARE_PROVIDER_SITE_OTHER): Payer: Medicare Other | Admitting: Gastroenterology

## 2022-04-14 ENCOUNTER — Ambulatory Visit: Payer: Medicare Other | Admitting: Cardiovascular Disease

## 2022-07-06 IMAGING — CT CT CHEST W/O CM
2 of 4 series · 15 of 36 positions shown, 18 images · non-contrast
Comparison: Plain film earlier today. No prior chest CT. A CT of
the abdomen of 01/22/2020 is reviewed.

CLINICAL DATA: Respiratory failure. Shortness of breath. Lower
abdominal pain. Symptoms for 3 weeks.

EXAM:
CT CHEST WITHOUT CONTRAST
TECHNIQUE: Multidetector CT imaging of the chest was performed following the
standard protocol without IV contrast.

[Series 2: routine chest without · axial · non-contrast · 0.83mm/px · z∈[+1314,+1620]mm · 12 of 181 slices shown, 15 images]
[im 14/181  mediastinal]
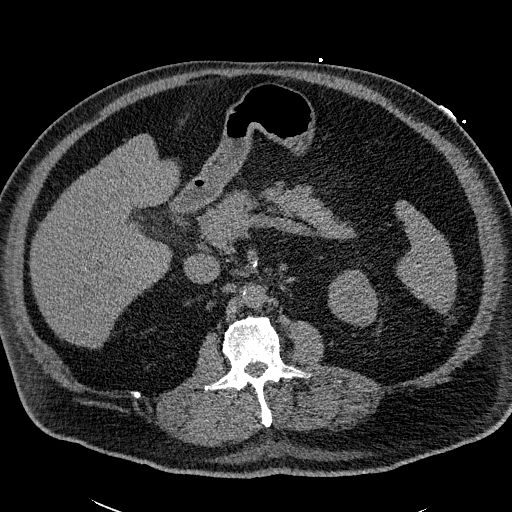
[im 14/181  lung]
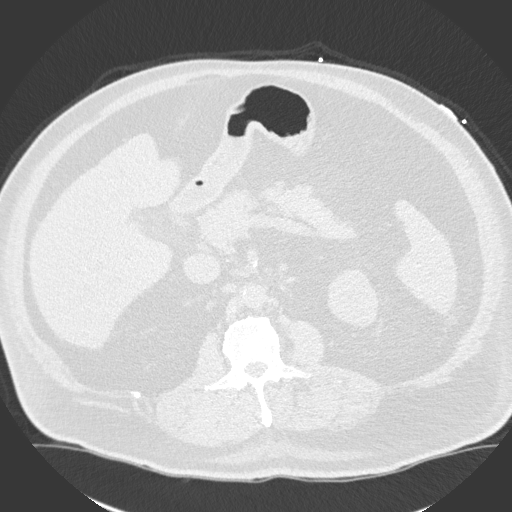
[im 28/181  lung]
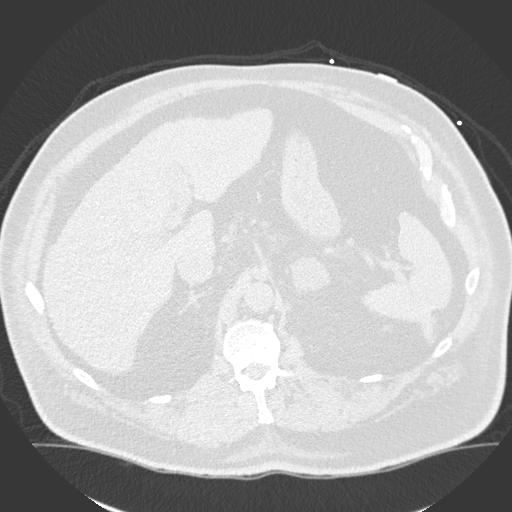
[im 42/181  lung]
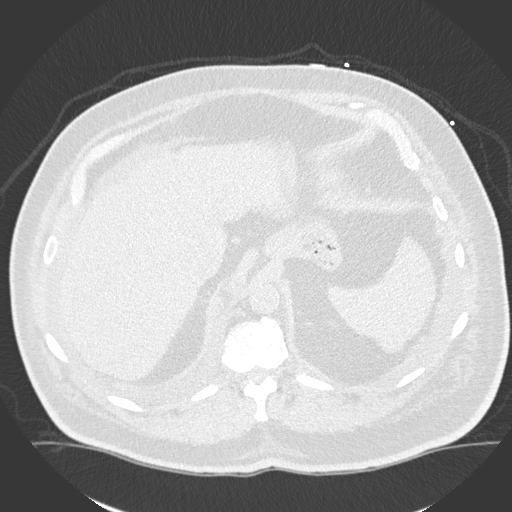
[im 56/181  lung]
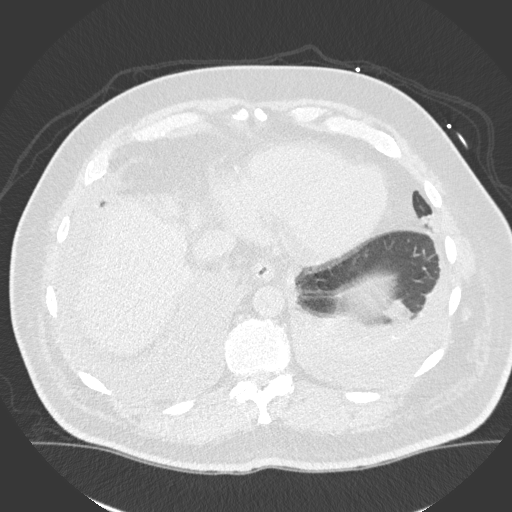
[im 70/181  mediastinal]
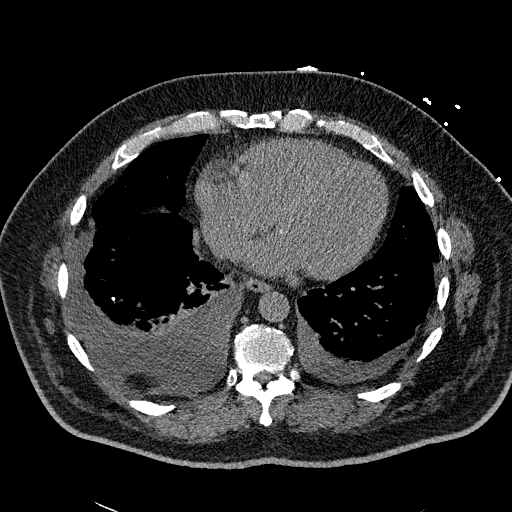
[im 70/181  lung]
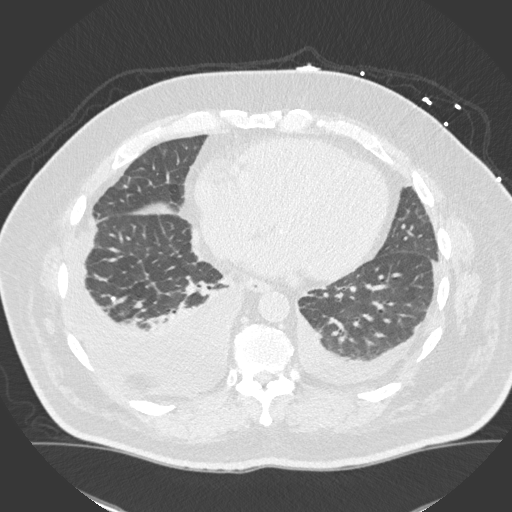
[im 84/181  lung]
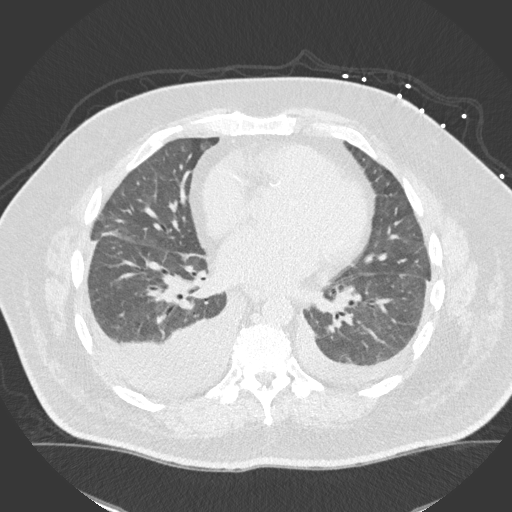
[im 97/181  lung]
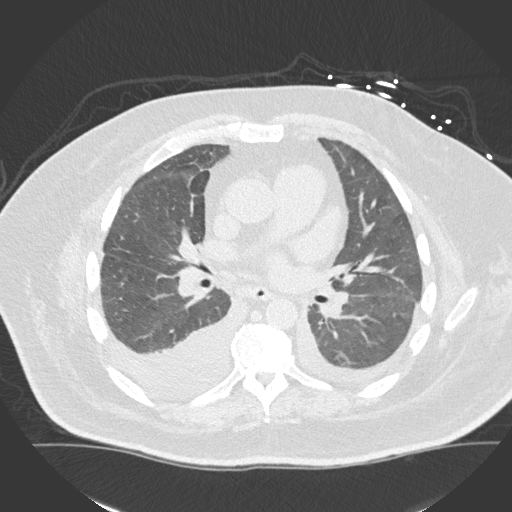
[im 111/181  lung]
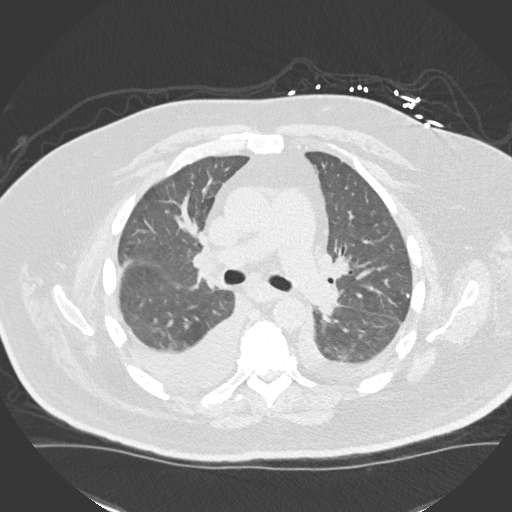
[im 125/181  mediastinal]
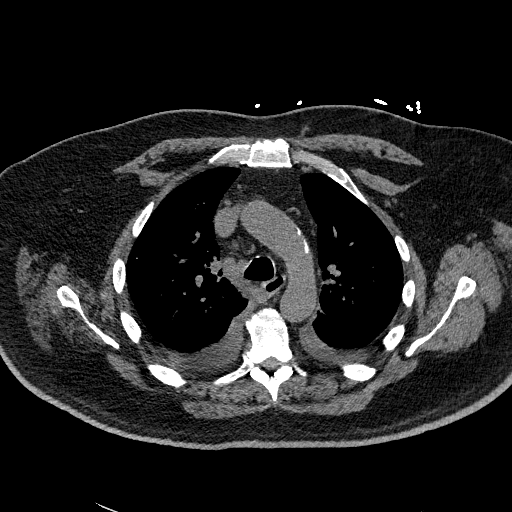
[im 125/181  lung]
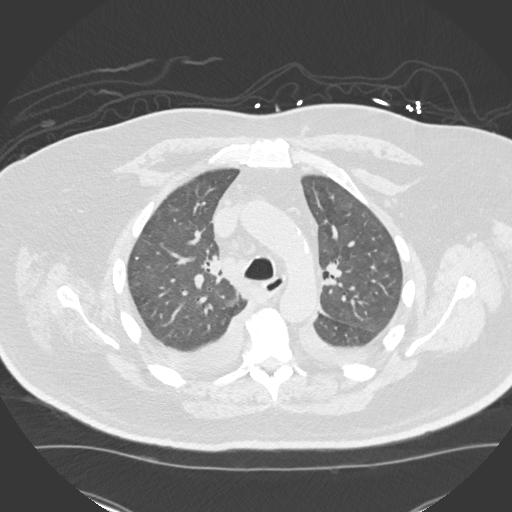
[im 139/181  lung]
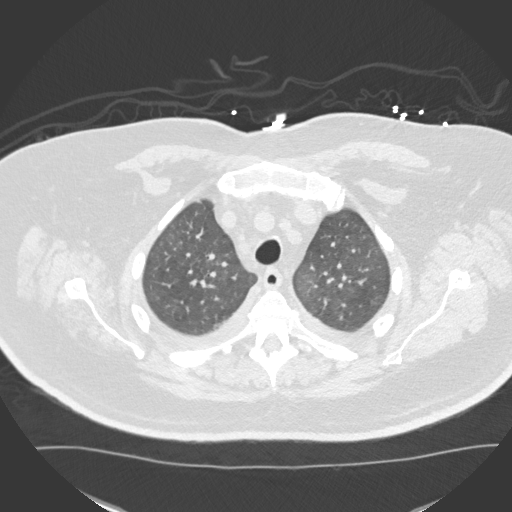
[im 153/181  lung]
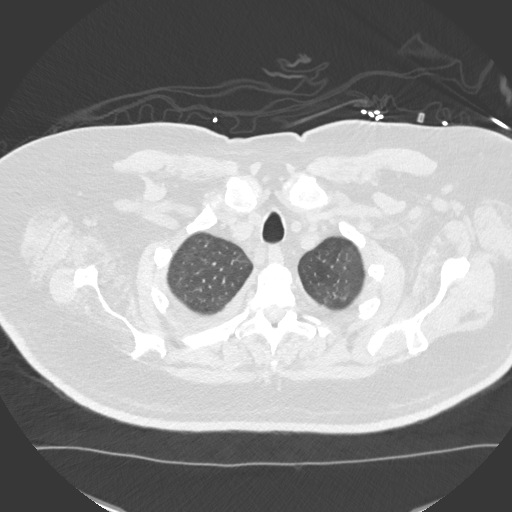
[im 167/181  lung]
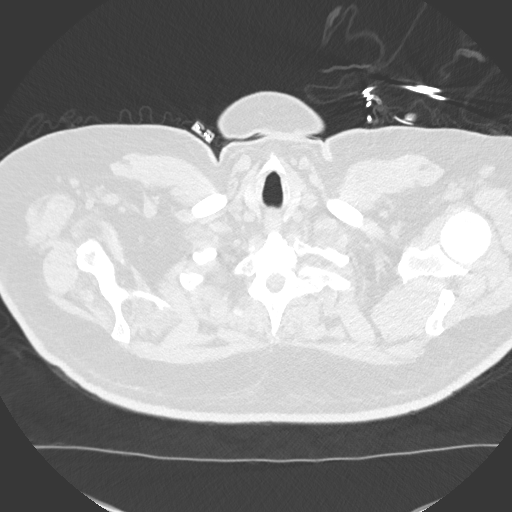

[Series 5: coronal · coronal · 0.70mm/px · 3 of 151 slices shown]
[im 31/151  lung]
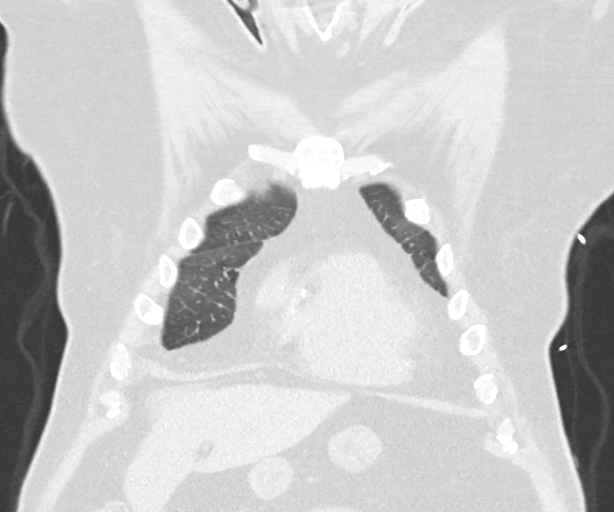
[im 61/151  lung]
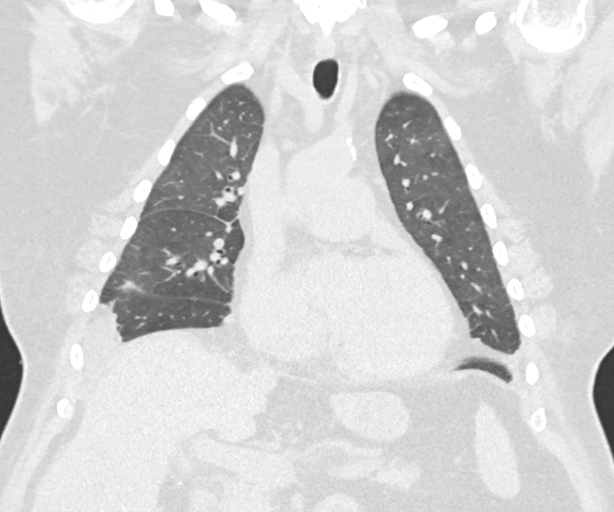
[im 91/151  lung]
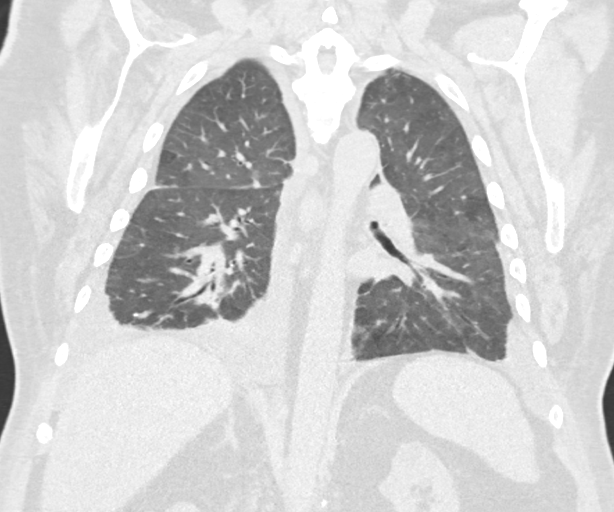

[15 of 36 positions shown; findings below may reference images not displayed]

FINDINGS: Cardiovascular: Aortic and branch vessel atherosclerosis. Mild
cardiomegaly, without pericardial effusion. Multivessel coronary
artery atherosclerosis. Pulmonary artery enlargement, outflow tract
3.9 cm

Mediastinum/Nodes: Multiple small middle mediastinal nodes. None
pathologic by size criteria. Hilar regions poorly evaluated without
intravenous contrast.

Lungs/Pleura: Small, right larger than left pleural effusions.
Minimal motion degradation.

Right lower lobe dependent atelectasis.

Areas of minimal posterior right upper lobe dependent atelectasis.

Mild patchy ground-glass opacity with areas of mild smooth septal
thickening.

Scattered calcified and noncalcified pulmonary nodules. Noncalcified
nodules including within the right upper lobe at 3 mm on 57/4, 3 mm
on 61/4.

Upper Abdomen: Moderate to marked cirrhosis. Cholecystectomy. Normal
imaged portions of the spleen, stomach, pancreas, right adrenal
gland, kidneys. 2.9 cm low-density left adrenal nodule, technically
indeterminate based on density measurements today, but on the order
of 7-9 HU on 01/22/2020, most consistent with an adenoma.

Musculoskeletal: Cervical and thoracic spondylosis.
IMPRESSION: 1. Constellation of findings, including bilateral pleural effusions,
smooth septal thickening, and scattered mild ground-glass, which are
most consistent with fluid overload, likely secondary to congestive
heart failure.
2. Tiny pulmonary nodules as detailed above. No follow-up needed if
patient is low-risk. Non-contrast chest CT can be considered in 12
months if patient is high-risk. This recommendation follows the
consensus statement: Guidelines for Management of Incidental
Pulmonary Nodules Detected on CT Images: From the [HOSPITAL]
3. Cirrhosis.
4. Left adrenal adenoma.
5. Coronary artery atherosclerosis. Aortic Atherosclerosis
(E1RSG-KX6.6).
6. Pulmonary artery enlargement suggests pulmonary arterial
hypertension.

## 2023-04-29 IMAGING — DX DG CHEST 1V PORT
1 series · 1 of 1 positions shown · non-contrast
Comparison: Radiograph 01/12/2021.

CLINICAL DATA: Vascular dialysis catheter in place.

EXAM:
PORTABLE CHEST 1 VIEW

[chest ap]
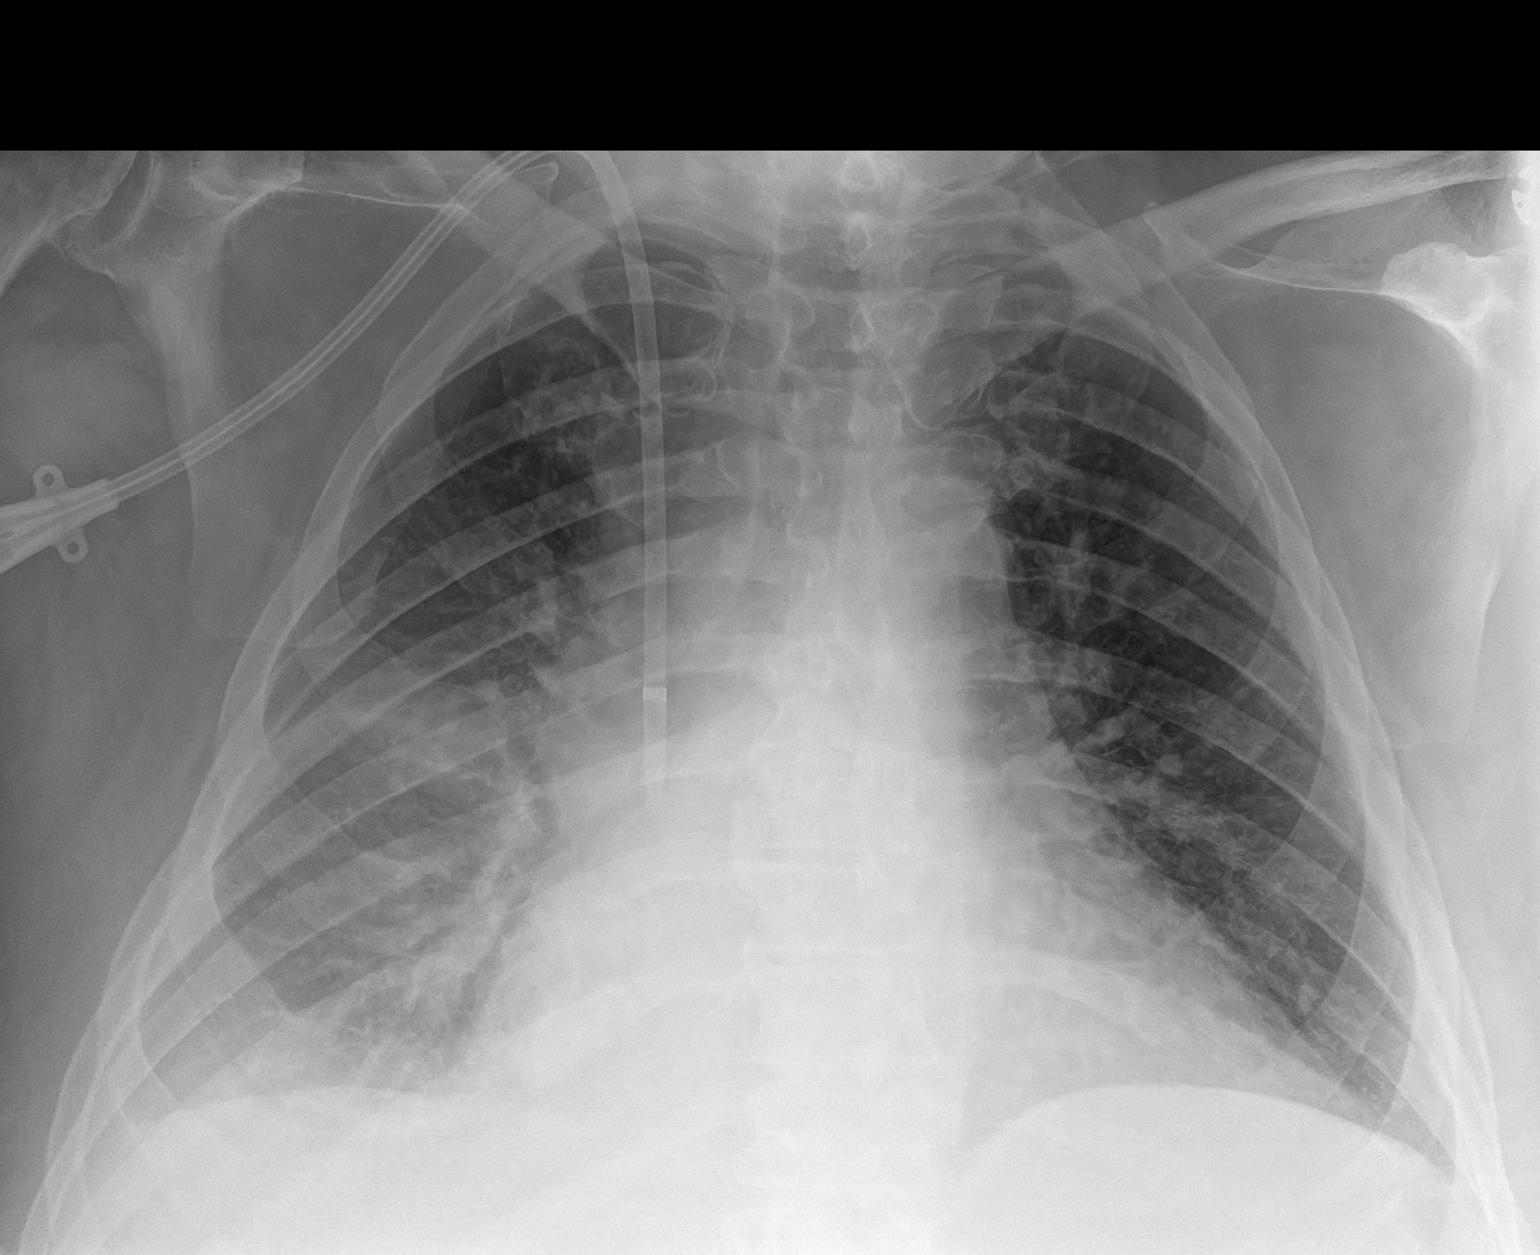

[1 of 1 positions shown; findings below may reference images not displayed]

FINDINGS: New right internal jugular central venous catheter tip overlies the
lower SVC. No pneumothorax. Stable cardiomegaly. Slight worsening
right basilar aeration and blunting of the costophrenic angle,
suspicious for increasing pleural effusion and atelectasis. Vascular
congestion.
IMPRESSION: 1. New right internal jugular central venous catheter with tip
overlying the lower SVC. No pneumothorax.
2. Slight worsening right basilar aeration and blunting of the
costophrenic angle, suspicious for increasing pleural effusion and
atelectasis. Unchanged cardiomegaly.

## 2023-06-10 IMAGING — DX DG CHEST 2V
2 series · 2 of 2 positions shown · non-contrast
Comparison: Chest radiograph 01/14/2021

CLINICAL DATA: Chest pain

EXAM:
CHEST - 2 VIEW

[chest lat]
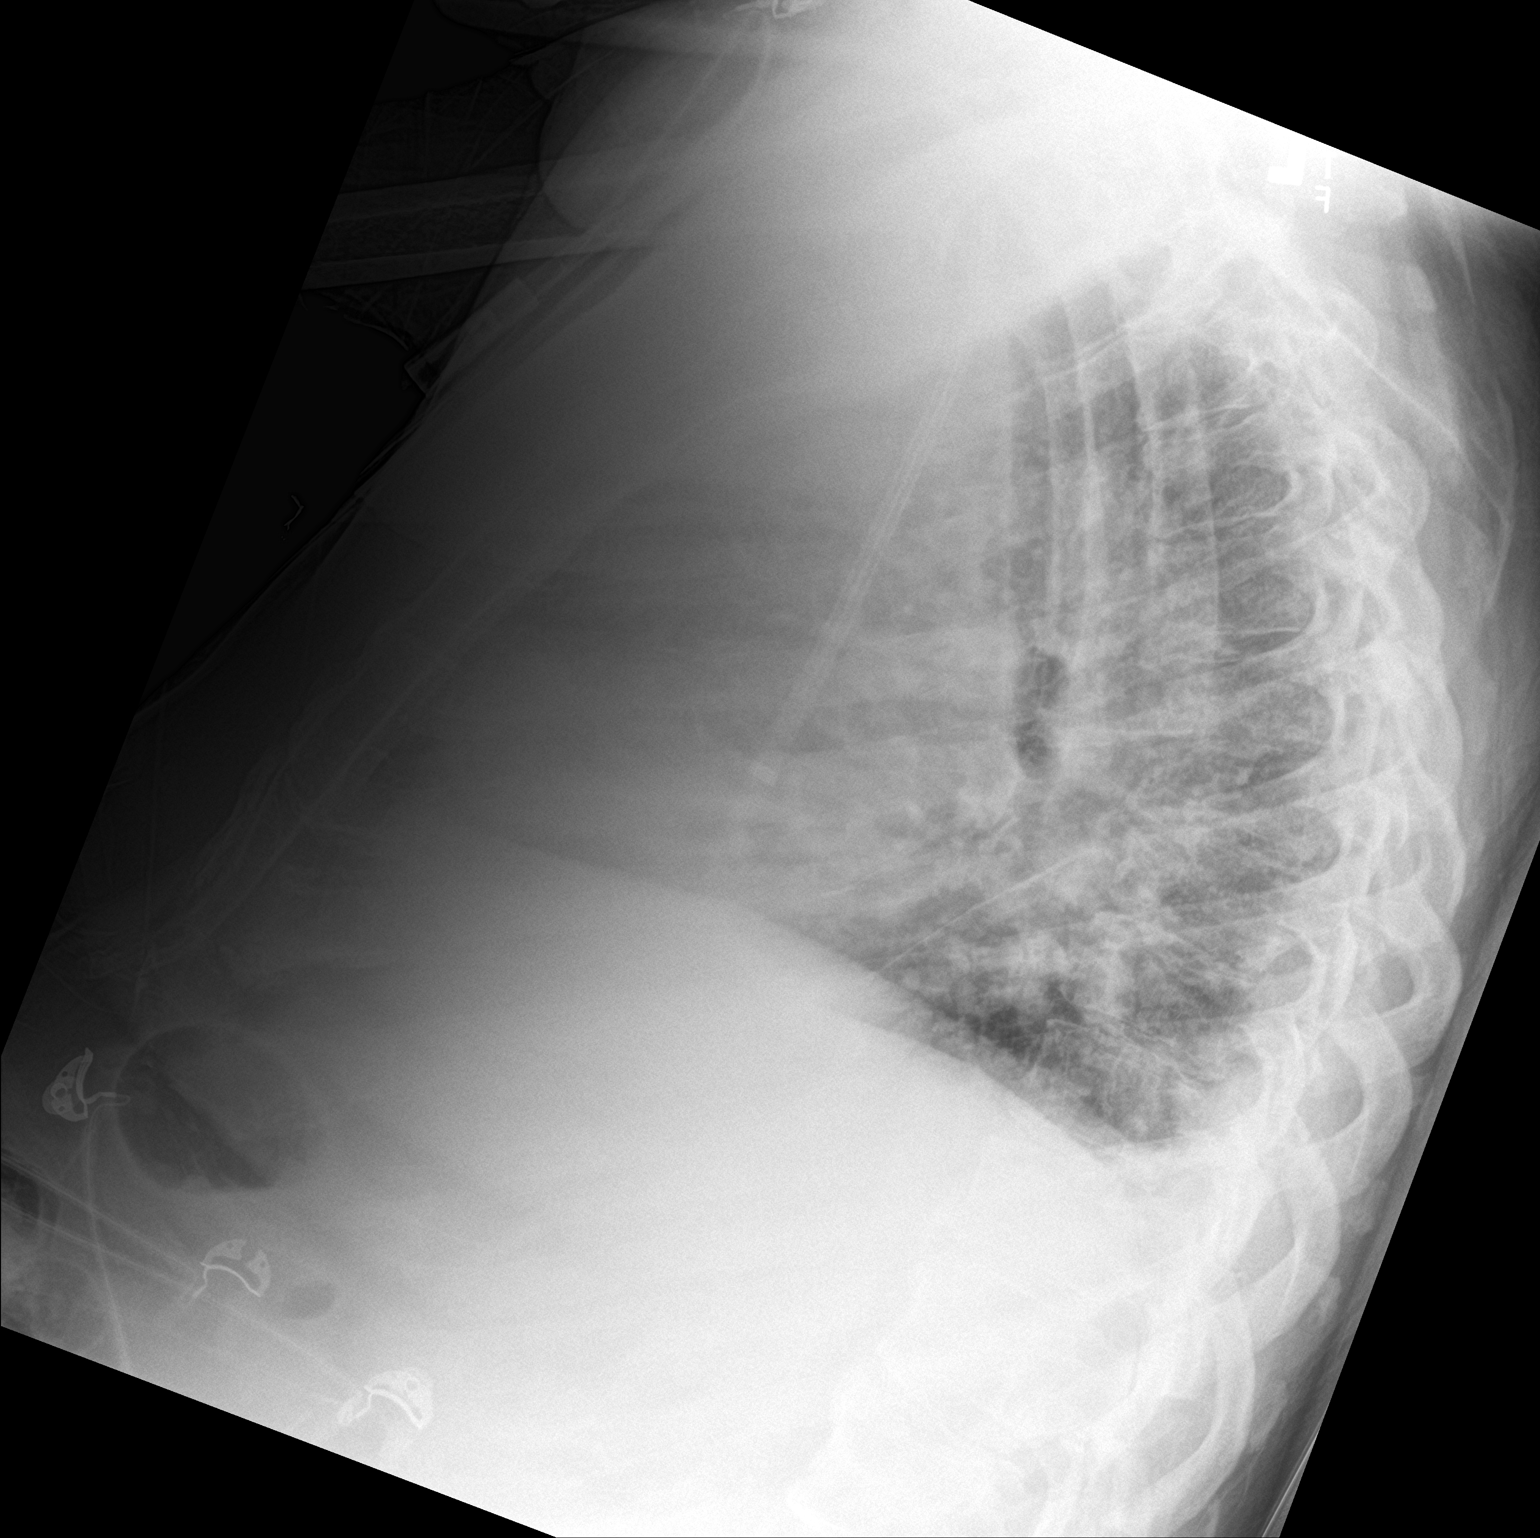

[chest ap]
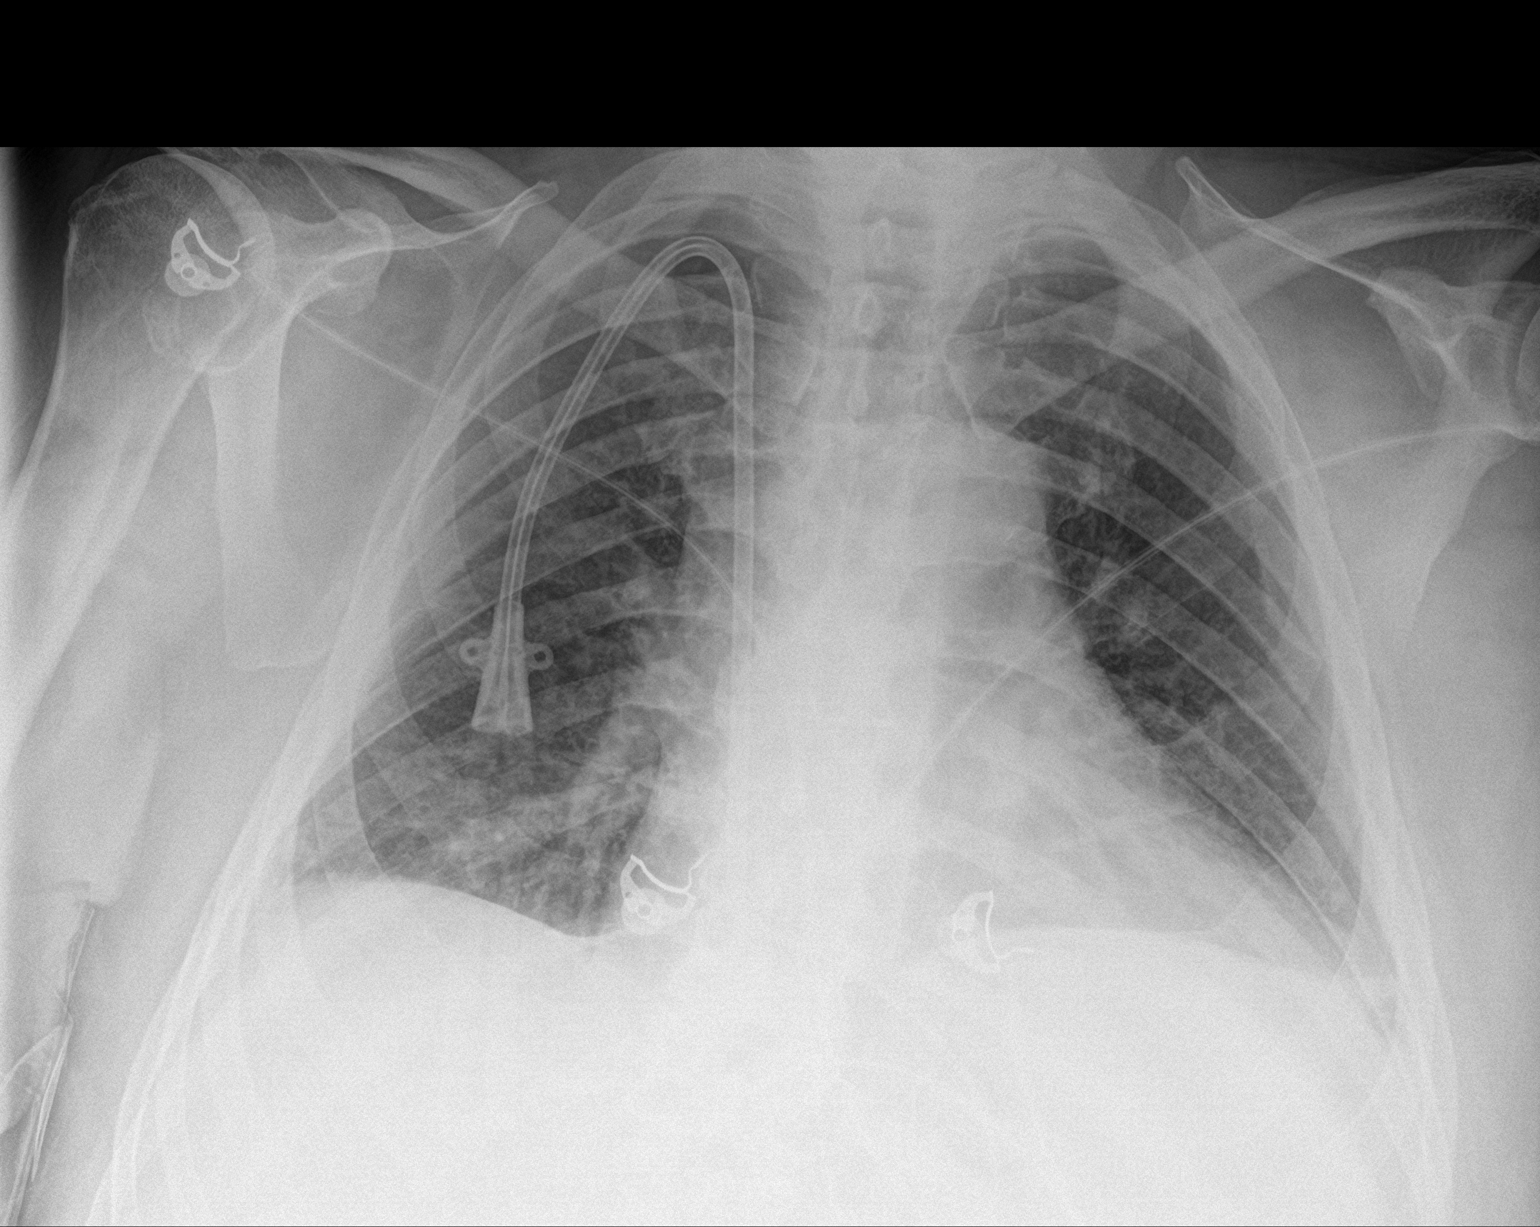

[2 of 2 positions shown; findings below may reference images not displayed]

FINDINGS: A right-sided dialysis catheter is in place terminating in the right
atrium.

The cardiomediastinal silhouette is stable.

Lung volumes are low. There are suspected trace bilateral pleural
effusions with mild adjacent airspace disease. Aeration of the right
base has improved since 01/14/2021. The upper lungs are well
aerated. There is no pneumothorax.

There is no acute osseous abnormality.
IMPRESSION: Trace bilateral pleural effusions with mild adjacent airspace
disease likely reflecting atelectasis; aeration of the right base
has improved since 01/14/2021.

## 2023-06-26 IMAGING — DX DG CHEST 1V PORT
1 series · 1 of 1 positions shown · non-contrast
Comparison: 02/25/2021.

CLINICAL DATA: Vas-Cath fell out of right subclavian

EXAM:
PORTABLE CHEST 1 VIEW

[chest ap grid]
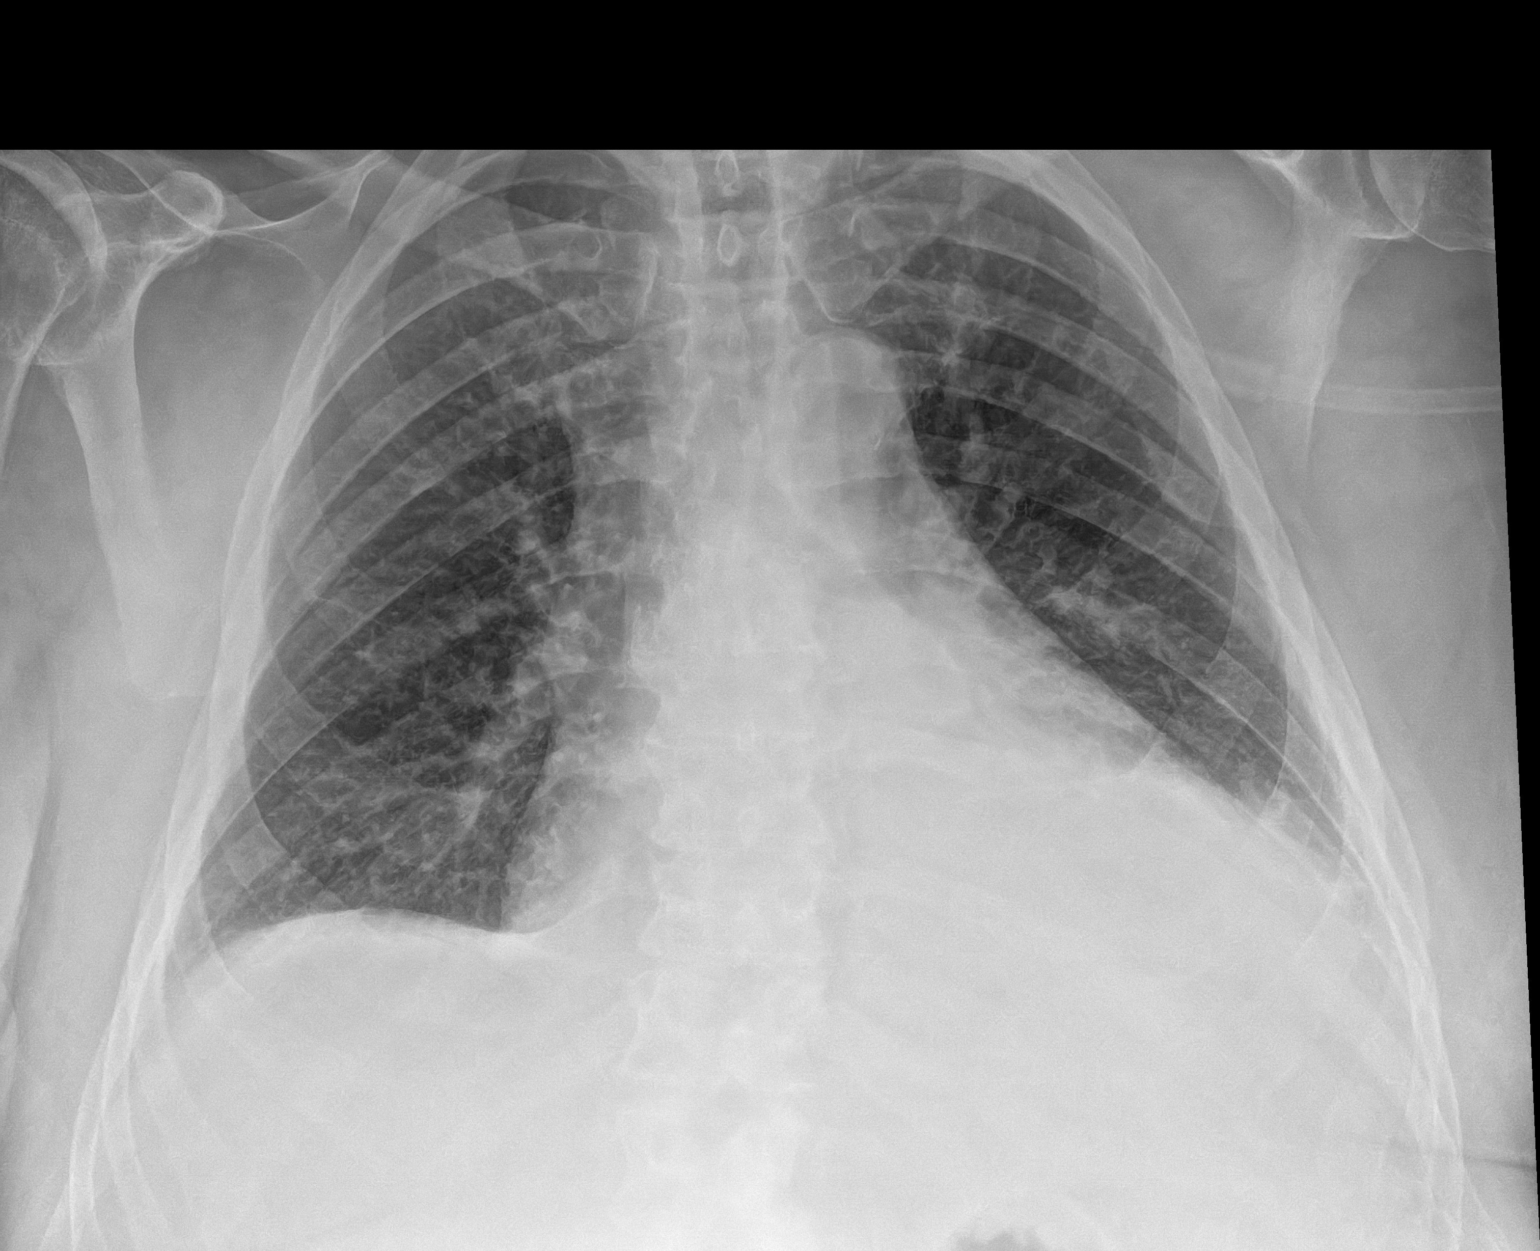

[1 of 1 positions shown; findings below may reference images not displayed]

FINDINGS: Interval removal of right CVC.

Unchanged cardiac and mediastinal silhouette. Trace pleural
effusions versus pleural thickening. No focal pulmonary opacity. No
acute osseous abnormality.
IMPRESSION: Interval removal of previously noted right CVC. Otherwise stable
chest.
